# Patient Record
Sex: Male | Born: 1935 | Race: White | Hispanic: No | Marital: Married | Smoking: Former smoker
Health system: Southern US, Community
[De-identification: ages and names within clinical notes are randomized; demographics above are authoritative.]

## PROBLEM LIST (undated history)

## (undated) DIAGNOSIS — J45909 Unspecified asthma, uncomplicated: Secondary | ICD-10-CM

## (undated) DIAGNOSIS — J189 Pneumonia, unspecified organism: Secondary | ICD-10-CM

## (undated) DIAGNOSIS — E785 Hyperlipidemia, unspecified: Secondary | ICD-10-CM

## (undated) DIAGNOSIS — M1990 Unspecified osteoarthritis, unspecified site: Secondary | ICD-10-CM

## (undated) DIAGNOSIS — S065X9A Traumatic subdural hemorrhage with loss of consciousness of unspecified duration, initial encounter: Secondary | ICD-10-CM

## (undated) DIAGNOSIS — H409 Unspecified glaucoma: Secondary | ICD-10-CM

## (undated) DIAGNOSIS — N182 Chronic kidney disease, stage 2 (mild): Secondary | ICD-10-CM

## (undated) DIAGNOSIS — M810 Age-related osteoporosis without current pathological fracture: Secondary | ICD-10-CM

## (undated) DIAGNOSIS — K222 Esophageal obstruction: Secondary | ICD-10-CM

## (undated) DIAGNOSIS — K7689 Other specified diseases of liver: Secondary | ICD-10-CM

## (undated) DIAGNOSIS — C4490 Unspecified malignant neoplasm of skin, unspecified: Secondary | ICD-10-CM

## (undated) DIAGNOSIS — I5032 Chronic diastolic (congestive) heart failure: Secondary | ICD-10-CM

## (undated) DIAGNOSIS — J449 Chronic obstructive pulmonary disease, unspecified: Secondary | ICD-10-CM

## (undated) DIAGNOSIS — F329 Major depressive disorder, single episode, unspecified: Secondary | ICD-10-CM

## (undated) DIAGNOSIS — I2510 Atherosclerotic heart disease of native coronary artery without angina pectoris: Secondary | ICD-10-CM

## (undated) DIAGNOSIS — K219 Gastro-esophageal reflux disease without esophagitis: Secondary | ICD-10-CM

## (undated) DIAGNOSIS — S065XAA Traumatic subdural hemorrhage with loss of consciousness status unknown, initial encounter: Secondary | ICD-10-CM

## (undated) DIAGNOSIS — F1021 Alcohol dependence, in remission: Secondary | ICD-10-CM

## (undated) DIAGNOSIS — F32A Depression, unspecified: Secondary | ICD-10-CM

## (undated) DIAGNOSIS — G4730 Sleep apnea, unspecified: Secondary | ICD-10-CM

## (undated) DIAGNOSIS — I10 Essential (primary) hypertension: Secondary | ICD-10-CM

## (undated) DIAGNOSIS — I771 Stricture of artery: Secondary | ICD-10-CM

## (undated) DIAGNOSIS — K5790 Diverticulosis of intestine, part unspecified, without perforation or abscess without bleeding: Secondary | ICD-10-CM

## (undated) DIAGNOSIS — I480 Paroxysmal atrial fibrillation: Secondary | ICD-10-CM

## (undated) DIAGNOSIS — K449 Diaphragmatic hernia without obstruction or gangrene: Secondary | ICD-10-CM

## (undated) HISTORY — DX: Chronic diastolic (congestive) heart failure: I50.32

## (undated) HISTORY — DX: Chronic obstructive pulmonary disease, unspecified: J44.9

## (undated) HISTORY — DX: Esophageal obstruction: K22.2

## (undated) HISTORY — DX: Diaphragmatic hernia without obstruction or gangrene: K44.9

## (undated) HISTORY — DX: Other specified diseases of liver: K76.89

## (undated) HISTORY — DX: Hyperlipidemia, unspecified: E78.5

## (undated) HISTORY — DX: Unspecified asthma, uncomplicated: J45.909

## (undated) HISTORY — PX: TONSILLECTOMY: SUR1361

## (undated) HISTORY — DX: Unspecified osteoarthritis, unspecified site: M19.90

## (undated) HISTORY — DX: Age-related osteoporosis without current pathological fracture: M81.0

## (undated) HISTORY — DX: Diverticulosis of intestine, part unspecified, without perforation or abscess without bleeding: K57.90

## (undated) HISTORY — PX: HERNIA REPAIR: SHX51

## (undated) HISTORY — DX: Stricture of artery: I77.1

## (undated) HISTORY — DX: Major depressive disorder, single episode, unspecified: F32.9

## (undated) HISTORY — DX: Sleep apnea, unspecified: G47.30

## (undated) HISTORY — DX: Unspecified glaucoma: H40.9

## (undated) HISTORY — DX: Depression, unspecified: F32.A

## (undated) HISTORY — DX: Gastro-esophageal reflux disease without esophagitis: K21.9

## (undated) HISTORY — DX: Alcohol dependence, in remission: F10.21

---

## 2001-09-08 DIAGNOSIS — G4730 Sleep apnea, unspecified: Secondary | ICD-10-CM

## 2001-09-08 HISTORY — DX: Sleep apnea, unspecified: G47.30

## 2001-10-06 ENCOUNTER — Ambulatory Visit (HOSPITAL_BASED_OUTPATIENT_CLINIC_OR_DEPARTMENT_OTHER): Admission: RE | Admit: 2001-10-06 | Discharge: 2001-10-06 | Payer: Self-pay | Admitting: Internal Medicine

## 2001-10-06 ENCOUNTER — Encounter: Payer: Self-pay | Admitting: Internal Medicine

## 2004-05-03 ENCOUNTER — Ambulatory Visit: Payer: Self-pay | Admitting: Internal Medicine

## 2004-06-28 ENCOUNTER — Ambulatory Visit: Payer: Self-pay | Admitting: Internal Medicine

## 2004-08-23 ENCOUNTER — Ambulatory Visit: Payer: Self-pay | Admitting: Internal Medicine

## 2004-12-15 ENCOUNTER — Ambulatory Visit: Payer: Self-pay | Admitting: Internal Medicine

## 2005-04-10 HISTORY — PX: CATARACT EXTRACTION, BILATERAL: SHX1313

## 2005-05-03 ENCOUNTER — Ambulatory Visit: Payer: Self-pay | Admitting: Internal Medicine

## 2005-06-15 ENCOUNTER — Ambulatory Visit: Payer: Self-pay | Admitting: Internal Medicine

## 2005-08-17 ENCOUNTER — Ambulatory Visit: Payer: Self-pay | Admitting: Internal Medicine

## 2005-09-26 ENCOUNTER — Ambulatory Visit: Payer: Self-pay | Admitting: Internal Medicine

## 2005-09-29 ENCOUNTER — Ambulatory Visit: Payer: Self-pay | Admitting: Internal Medicine

## 2005-11-01 ENCOUNTER — Ambulatory Visit: Payer: Self-pay | Admitting: Internal Medicine

## 2005-12-13 ENCOUNTER — Ambulatory Visit: Payer: Self-pay | Admitting: Internal Medicine

## 2005-12-20 ENCOUNTER — Ambulatory Visit: Payer: Self-pay | Admitting: Internal Medicine

## 2006-04-25 ENCOUNTER — Ambulatory Visit: Payer: Self-pay | Admitting: Internal Medicine

## 2006-05-02 ENCOUNTER — Ambulatory Visit: Payer: Self-pay | Admitting: Internal Medicine

## 2006-06-05 ENCOUNTER — Ambulatory Visit: Payer: Self-pay | Admitting: Internal Medicine

## 2006-08-08 ENCOUNTER — Ambulatory Visit: Payer: Self-pay | Admitting: Internal Medicine

## 2006-11-09 ENCOUNTER — Ambulatory Visit: Payer: Self-pay | Admitting: Internal Medicine

## 2006-12-19 ENCOUNTER — Ambulatory Visit: Payer: Self-pay | Admitting: Internal Medicine

## 2006-12-31 ENCOUNTER — Ambulatory Visit: Payer: Self-pay | Admitting: Internal Medicine

## 2007-02-09 HISTORY — PX: CORONARY ARTERY BYPASS GRAFT: SHX141

## 2007-02-20 ENCOUNTER — Encounter: Payer: Self-pay | Admitting: Internal Medicine

## 2007-02-25 ENCOUNTER — Ambulatory Visit: Payer: Self-pay | Admitting: Cardiothoracic Surgery

## 2007-02-26 ENCOUNTER — Ambulatory Visit (HOSPITAL_COMMUNITY): Admission: RE | Admit: 2007-02-26 | Discharge: 2007-02-26 | Payer: Self-pay | Admitting: Cardiothoracic Surgery

## 2007-02-26 ENCOUNTER — Ambulatory Visit: Payer: Self-pay | Admitting: Vascular Surgery

## 2007-02-26 ENCOUNTER — Encounter: Payer: Self-pay | Admitting: Cardiothoracic Surgery

## 2007-02-28 ENCOUNTER — Inpatient Hospital Stay (HOSPITAL_COMMUNITY): Admission: RE | Admit: 2007-02-28 | Discharge: 2007-03-05 | Payer: Self-pay | Admitting: Cardiothoracic Surgery

## 2007-02-28 ENCOUNTER — Encounter: Payer: Self-pay | Admitting: Internal Medicine

## 2007-02-28 ENCOUNTER — Ambulatory Visit: Payer: Self-pay | Admitting: Internal Medicine

## 2007-03-01 ENCOUNTER — Ambulatory Visit: Payer: Self-pay | Admitting: Cardiothoracic Surgery

## 2007-03-04 ENCOUNTER — Encounter: Payer: Self-pay | Admitting: Cardiothoracic Surgery

## 2007-03-28 ENCOUNTER — Encounter (HOSPITAL_COMMUNITY): Admission: RE | Admit: 2007-03-28 | Discharge: 2007-04-10 | Payer: Self-pay | Admitting: Cardiology

## 2007-03-29 ENCOUNTER — Ambulatory Visit: Payer: Self-pay | Admitting: Cardiothoracic Surgery

## 2007-04-11 ENCOUNTER — Encounter (HOSPITAL_COMMUNITY): Admission: RE | Admit: 2007-04-11 | Discharge: 2007-07-10 | Payer: Self-pay | Admitting: Cardiology

## 2007-04-25 ENCOUNTER — Ambulatory Visit: Payer: Self-pay | Admitting: Internal Medicine

## 2007-05-10 DIAGNOSIS — I2510 Atherosclerotic heart disease of native coronary artery without angina pectoris: Secondary | ICD-10-CM | POA: Insufficient documentation

## 2007-05-10 DIAGNOSIS — G4733 Obstructive sleep apnea (adult) (pediatric): Secondary | ICD-10-CM

## 2007-05-10 DIAGNOSIS — I10 Essential (primary) hypertension: Secondary | ICD-10-CM

## 2007-05-10 DIAGNOSIS — J449 Chronic obstructive pulmonary disease, unspecified: Secondary | ICD-10-CM

## 2007-05-10 DIAGNOSIS — J302 Other seasonal allergic rhinitis: Secondary | ICD-10-CM

## 2007-05-10 DIAGNOSIS — J3089 Other allergic rhinitis: Secondary | ICD-10-CM

## 2007-05-13 ENCOUNTER — Ambulatory Visit: Payer: Self-pay | Admitting: Internal Medicine

## 2007-05-22 ENCOUNTER — Encounter: Payer: Self-pay | Admitting: Internal Medicine

## 2007-06-27 ENCOUNTER — Ambulatory Visit: Payer: Self-pay | Admitting: Internal Medicine

## 2007-08-29 ENCOUNTER — Ambulatory Visit: Payer: Self-pay | Admitting: Internal Medicine

## 2007-09-23 ENCOUNTER — Ambulatory Visit: Payer: Self-pay | Admitting: Internal Medicine

## 2007-09-23 ENCOUNTER — Encounter: Payer: Self-pay | Admitting: Internal Medicine

## 2007-10-01 ENCOUNTER — Telehealth: Payer: Self-pay | Admitting: Internal Medicine

## 2007-10-03 ENCOUNTER — Encounter: Payer: Self-pay | Admitting: Internal Medicine

## 2007-10-16 ENCOUNTER — Ambulatory Visit: Payer: Self-pay | Admitting: Internal Medicine

## 2007-10-18 ENCOUNTER — Telehealth (INDEPENDENT_AMBULATORY_CARE_PROVIDER_SITE_OTHER): Payer: Self-pay | Admitting: *Deleted

## 2007-10-18 ENCOUNTER — Telehealth: Payer: Self-pay | Admitting: Internal Medicine

## 2007-10-22 ENCOUNTER — Ambulatory Visit: Payer: Self-pay | Admitting: Internal Medicine

## 2007-10-22 LAB — CONVERTED CEMR LAB
ALT: 463 units/L — ABNORMAL HIGH (ref 0–53)
AST: 353 units/L — ABNORMAL HIGH (ref 0–37)
Anti Nuclear Antibody(ANA): NEGATIVE
BUN: 14 mg/dL (ref 6–23)
Calcium: 9 mg/dL (ref 8.4–10.5)
Eosinophils Absolute: 0.2 10*3/uL (ref 0.0–0.7)
Eosinophils Relative: 2.2 % (ref 0.0–5.0)
HCT: 38.5 % — ABNORMAL LOW (ref 39.0–52.0)
MCHC: 34.5 g/dL (ref 30.0–36.0)
Monocytes Relative: 5.6 % (ref 3.0–12.0)
Neutro Abs: 7.5 10*3/uL (ref 1.4–7.7)
Platelets: 356 10*3/uL (ref 150–400)
Pro B Natriuretic peptide (BNP): 75 pg/mL (ref 0.0–100.0)
RBC: 4.29 M/uL (ref 4.22–5.81)
RDW: 14.9 % — ABNORMAL HIGH (ref 11.5–14.6)
Total Protein: 6.6 g/dL (ref 6.0–8.3)
WBC: 8.8 10*3/uL (ref 4.5–10.5)

## 2007-10-23 ENCOUNTER — Ambulatory Visit: Payer: Self-pay | Admitting: Cardiology

## 2007-10-24 ENCOUNTER — Telehealth: Payer: Self-pay | Admitting: Internal Medicine

## 2007-10-25 ENCOUNTER — Ambulatory Visit: Payer: Self-pay | Admitting: Internal Medicine

## 2007-10-28 ENCOUNTER — Ambulatory Visit: Payer: Self-pay | Admitting: Pulmonary Disease

## 2007-10-28 ENCOUNTER — Telehealth: Payer: Self-pay | Admitting: Internal Medicine

## 2007-10-28 ENCOUNTER — Inpatient Hospital Stay (HOSPITAL_COMMUNITY): Admission: AD | Admit: 2007-10-28 | Discharge: 2007-11-01 | Payer: Self-pay | Admitting: Internal Medicine

## 2007-10-29 ENCOUNTER — Encounter: Payer: Self-pay | Admitting: Internal Medicine

## 2007-10-30 LAB — HM COLONOSCOPY

## 2007-10-31 ENCOUNTER — Encounter: Payer: Self-pay | Admitting: Internal Medicine

## 2007-11-04 ENCOUNTER — Ambulatory Visit: Payer: Self-pay | Admitting: Internal Medicine

## 2007-11-05 ENCOUNTER — Ambulatory Visit: Payer: Self-pay | Admitting: Internal Medicine

## 2007-11-05 LAB — CONVERTED CEMR LAB
ALT: 170 units/L — ABNORMAL HIGH (ref 0–53)
AST: 73 units/L — ABNORMAL HIGH (ref 0–37)
Albumin: 2.7 g/dL — ABNORMAL LOW (ref 3.5–5.2)
Alkaline Phosphatase: 91 units/L (ref 39–117)
Bilirubin, Direct: 0.2 mg/dL (ref 0.0–0.3)
Total Protein: 6.5 g/dL (ref 6.0–8.3)

## 2007-11-18 ENCOUNTER — Telehealth: Payer: Self-pay | Admitting: Internal Medicine

## 2007-12-03 ENCOUNTER — Ambulatory Visit: Payer: Self-pay | Admitting: Internal Medicine

## 2007-12-03 DIAGNOSIS — K222 Esophageal obstruction: Secondary | ICD-10-CM

## 2007-12-04 LAB — CONVERTED CEMR LAB
AST: 31 units/L (ref 0–37)
Albumin: 3.3 g/dL — ABNORMAL LOW (ref 3.5–5.2)
Alkaline Phosphatase: 61 units/L (ref 39–117)
Total Bilirubin: 0.9 mg/dL (ref 0.3–1.2)
Total Protein: 6.5 g/dL (ref 6.0–8.3)

## 2007-12-12 ENCOUNTER — Ambulatory Visit: Payer: Self-pay | Admitting: Internal Medicine

## 2007-12-15 ENCOUNTER — Encounter: Admission: RE | Admit: 2007-12-15 | Discharge: 2007-12-15 | Payer: Self-pay | Admitting: Neurology

## 2008-01-03 ENCOUNTER — Encounter: Payer: Self-pay | Admitting: Internal Medicine

## 2008-01-06 ENCOUNTER — Ambulatory Visit: Payer: Self-pay | Admitting: Internal Medicine

## 2008-03-30 ENCOUNTER — Ambulatory Visit: Payer: Self-pay | Admitting: Internal Medicine

## 2008-05-12 ENCOUNTER — Telehealth: Payer: Self-pay | Admitting: Internal Medicine

## 2008-05-12 ENCOUNTER — Ambulatory Visit: Payer: Self-pay | Admitting: Internal Medicine

## 2008-05-12 LAB — CONVERTED CEMR LAB: BUN: 18 mg/dL (ref 6–23)

## 2008-05-18 ENCOUNTER — Ambulatory Visit (HOSPITAL_COMMUNITY): Admission: RE | Admit: 2008-05-18 | Discharge: 2008-05-18 | Payer: Self-pay | Admitting: Internal Medicine

## 2008-07-21 ENCOUNTER — Encounter: Payer: Self-pay | Admitting: Internal Medicine

## 2008-07-22 ENCOUNTER — Ambulatory Visit: Payer: Self-pay | Admitting: Internal Medicine

## 2008-07-29 ENCOUNTER — Ambulatory Visit: Payer: Self-pay | Admitting: Internal Medicine

## 2008-07-29 DIAGNOSIS — K648 Other hemorrhoids: Secondary | ICD-10-CM | POA: Insufficient documentation

## 2008-09-23 ENCOUNTER — Encounter: Payer: Self-pay | Admitting: Internal Medicine

## 2008-10-25 ENCOUNTER — Encounter: Payer: Self-pay | Admitting: Internal Medicine

## 2008-11-09 ENCOUNTER — Ambulatory Visit: Payer: Self-pay | Admitting: Internal Medicine

## 2008-11-09 ENCOUNTER — Encounter: Payer: Self-pay | Admitting: Internal Medicine

## 2008-11-10 ENCOUNTER — Telehealth (INDEPENDENT_AMBULATORY_CARE_PROVIDER_SITE_OTHER): Payer: Self-pay | Admitting: *Deleted

## 2008-11-10 ENCOUNTER — Ambulatory Visit: Payer: Self-pay | Admitting: Internal Medicine

## 2008-11-10 LAB — CONVERTED CEMR LAB
BUN: 18 mg/dL (ref 6–23)
Creatinine, Ser: 0.9 mg/dL (ref 0.4–1.5)

## 2008-11-16 ENCOUNTER — Encounter: Payer: Self-pay | Admitting: Internal Medicine

## 2008-11-18 ENCOUNTER — Telehealth (INDEPENDENT_AMBULATORY_CARE_PROVIDER_SITE_OTHER): Payer: Self-pay | Admitting: *Deleted

## 2008-11-19 ENCOUNTER — Ambulatory Visit (HOSPITAL_COMMUNITY): Admission: RE | Admit: 2008-11-19 | Discharge: 2008-11-19 | Payer: Self-pay | Admitting: Internal Medicine

## 2008-11-19 ENCOUNTER — Ambulatory Visit: Payer: Self-pay | Admitting: Internal Medicine

## 2008-11-25 ENCOUNTER — Encounter: Payer: Self-pay | Admitting: Internal Medicine

## 2008-12-02 ENCOUNTER — Encounter: Payer: Self-pay | Admitting: Internal Medicine

## 2008-12-03 ENCOUNTER — Encounter: Payer: Self-pay | Admitting: Internal Medicine

## 2008-12-10 ENCOUNTER — Ambulatory Visit: Payer: Self-pay | Admitting: Internal Medicine

## 2008-12-11 ENCOUNTER — Ambulatory Visit: Payer: Self-pay | Admitting: Internal Medicine

## 2008-12-22 ENCOUNTER — Encounter: Payer: Self-pay | Admitting: Internal Medicine

## 2009-01-05 ENCOUNTER — Encounter: Payer: Self-pay | Admitting: Internal Medicine

## 2009-02-22 ENCOUNTER — Ambulatory Visit: Payer: Self-pay | Admitting: Internal Medicine

## 2009-03-11 ENCOUNTER — Ambulatory Visit: Payer: Self-pay | Admitting: Internal Medicine

## 2009-04-27 ENCOUNTER — Ambulatory Visit: Payer: Self-pay | Admitting: Internal Medicine

## 2009-06-23 ENCOUNTER — Telehealth: Payer: Self-pay | Admitting: Internal Medicine

## 2009-07-09 ENCOUNTER — Ambulatory Visit: Payer: Self-pay | Admitting: Internal Medicine

## 2009-07-12 ENCOUNTER — Encounter: Payer: Self-pay | Admitting: Internal Medicine

## 2009-07-14 ENCOUNTER — Telehealth (INDEPENDENT_AMBULATORY_CARE_PROVIDER_SITE_OTHER): Payer: Self-pay | Admitting: *Deleted

## 2009-07-15 ENCOUNTER — Encounter: Payer: Self-pay | Admitting: Internal Medicine

## 2009-07-19 ENCOUNTER — Ambulatory Visit: Payer: Self-pay | Admitting: Internal Medicine

## 2009-07-20 ENCOUNTER — Telehealth (INDEPENDENT_AMBULATORY_CARE_PROVIDER_SITE_OTHER): Payer: Self-pay | Admitting: *Deleted

## 2009-07-21 ENCOUNTER — Encounter: Payer: Self-pay | Admitting: Internal Medicine

## 2009-07-22 ENCOUNTER — Encounter: Payer: Self-pay | Admitting: Internal Medicine

## 2009-07-28 ENCOUNTER — Ambulatory Visit: Payer: Self-pay | Admitting: Internal Medicine

## 2009-08-03 ENCOUNTER — Ambulatory Visit: Payer: Self-pay | Admitting: Internal Medicine

## 2009-09-09 ENCOUNTER — Ambulatory Visit: Payer: Self-pay | Admitting: Internal Medicine

## 2009-09-29 ENCOUNTER — Telehealth (INDEPENDENT_AMBULATORY_CARE_PROVIDER_SITE_OTHER): Payer: Self-pay | Admitting: *Deleted

## 2009-09-30 ENCOUNTER — Ambulatory Visit: Payer: Self-pay | Admitting: Internal Medicine

## 2009-10-04 ENCOUNTER — Ambulatory Visit: Payer: Self-pay | Admitting: Cardiology

## 2009-10-04 DIAGNOSIS — E785 Hyperlipidemia, unspecified: Secondary | ICD-10-CM | POA: Insufficient documentation

## 2009-11-19 ENCOUNTER — Ambulatory Visit: Payer: Self-pay | Admitting: Internal Medicine

## 2009-11-19 DIAGNOSIS — K219 Gastro-esophageal reflux disease without esophagitis: Secondary | ICD-10-CM

## 2009-11-19 DIAGNOSIS — Z87891 Personal history of nicotine dependence: Secondary | ICD-10-CM

## 2009-11-19 DIAGNOSIS — Z87448 Personal history of other diseases of urinary system: Secondary | ICD-10-CM

## 2009-11-29 ENCOUNTER — Ambulatory Visit: Payer: Self-pay | Admitting: Cardiology

## 2009-11-29 ENCOUNTER — Ambulatory Visit: Payer: Self-pay

## 2009-12-03 LAB — CONVERTED CEMR LAB
Albumin: 3.9 g/dL (ref 3.5–5.2)
Alkaline Phosphatase: 57 units/L (ref 39–117)
HDL: 32.9 mg/dL — ABNORMAL LOW (ref >39.00)
Triglycerides: 77 mg/dL (ref 0.0–149.0)

## 2009-12-08 ENCOUNTER — Ambulatory Visit: Payer: Self-pay | Admitting: Internal Medicine

## 2009-12-08 ENCOUNTER — Encounter: Payer: Self-pay | Admitting: Internal Medicine

## 2009-12-30 ENCOUNTER — Ambulatory Visit: Payer: Self-pay | Admitting: Internal Medicine

## 2010-01-18 ENCOUNTER — Telehealth: Payer: Self-pay | Admitting: Cardiology

## 2010-01-20 ENCOUNTER — Telehealth: Payer: Self-pay | Admitting: Cardiology

## 2010-03-25 ENCOUNTER — Ambulatory Visit: Payer: Self-pay | Admitting: Internal Medicine

## 2010-03-25 DIAGNOSIS — G4710 Hypersomnia, unspecified: Secondary | ICD-10-CM

## 2010-03-29 ENCOUNTER — Telehealth: Payer: Self-pay | Admitting: Internal Medicine

## 2010-04-14 ENCOUNTER — Ambulatory Visit: Payer: Self-pay | Admitting: Internal Medicine

## 2010-05-02 ENCOUNTER — Encounter: Payer: Self-pay | Admitting: Internal Medicine

## 2010-05-08 LAB — CONVERTED CEMR LAB
ALT: 23 units/L
ALT: 27 units/L
AST: 24 units/L
AST: 25 units/L
Albumin: 4.3 g/dL
Albumin: 4.4 g/dL
Alkaline Phosphatase: 72 units/L
Chloride: 98 meq/L
Cholesterol: 195 mg/dL
Glucose, Bld: 110 mg/dL
HDL: 42 mg/dL
HDL: 44 mg/dL
LDL Cholesterol: 133 mg/dL
LDL Cholesterol: 60 mg/dL
LDL Cholesterol: 61 mg/dL
Potassium: 3.8 meq/L
Total Bilirubin: 0.5 mg/dL
Total Bilirubin: 0.8 mg/dL
Total Protein: 6.3 g/dL
Total Protein: 6.7 g/dL
Triglyceride fasting, serum: 78 mg/dL
Triglyceride fasting, serum: 88 mg/dL

## 2010-05-12 NOTE — Assessment & Plan Note (Signed)
Summary: per pt wife call falling asleep while driving/cb   Primary Provider/Referring Provider:  [facility]  CC:  Acute visit-pt is falling asleep while driving and eating.Marland Kitchen  History of Present Illness: September 30, 2009-  Asthma, allergic rhinitis, OSA, ................................Marland Kitchenwife here He was treated by NP over last 2 months and is feeling much better. She put him on otc prilosec and that also seems to have helped cough. He was much better until a week ago when he began needing to use his rescue inhaler again. More wheeze and exertional dypnea. Denies fever, sore throat, purulent. This week using both nebulizer and rescue inhaler. doesn't feel reflux. He started prednisone yesterday- taper. We discussed it and will extend it if needed.  December 30, 2009- Asthma, allergic rhinitis, OSA, GERD.......................Marland Kitchenwife here Fairly stable through the summer. In the last 2 days he has been wheezing and his routine use of nebulizer isn't clearing it. This may correspond to onset of rainy weather. Nose is also more congested, running and sneezing. He is frightened by potential for rapid progression of wheezing dyspnea. Wife assures me that he is compliant with his regimen.  March 25, 2010- Asthma, allergic rhinitis, OSA, GERD.......................Marland Kitchenwife here Nurse-CC: Acute visit-pt is falling asleep while driving and eating. Acute visit- Despite Provigil and compliant use of CPAP he is falling asleep driving, eating, conversation.  Usually Provigil had prevented this, using 1/2 x 200 mg. He sleeps every day during naps. Wife says he does snore some through his CPAP.      Asthma History    Asthma Control Assessment:    Age range: 12+ years    Symptoms: 0-2 days/week    Nighttime Awakenings: 0-2/month    Interferes w/ normal activity: no limitations    SABA use (not for EIB): 0-2 days/week    FEV1: 1.45 liters (today)    FEV1 Pred: 2.04 liters (today)  Asthma Control Assessment: Not Well Controlled   Preventive Screening-Counseling & Management  Alcohol-Tobacco     Alcohol drinks/day: 0     Alcohol Counseling: not indicated; patient does not drink     Smoking Status: quit     Year Quit: 1981     Cans of tobacco/week: no     Passive Smoke Exposure: yes     Tobacco Counseling: to remain off tobacco products  Current Medications (verified): 1)  Spiriva Handihaler 18 Mcg Caps (Tiotropium Bromide Monohydrate) .Marland Kitchen.. 1 Daily 2)  Budesonide 0.25 Mg/33ml Susp (Budesonide) .Marland Kitchen.. 1 Neb Two Times A Day 3)  Albuterol Sulfate (2.5 Mg/27ml) 0.083% Nebu (Albuterol Sulfate) .Marland Kitchen.. 1 Vial Via Hhn Two Times A Day 4)  Diovan Hct 160-12.5 Mg  Tabs (Valsartan-Hydrochlorothiazide) .Marland Kitchen.. 1 By Mouth Two Times A Day 5)  Klor-Con M20 20 Meq Cr-Tabs (Potassium Chloride Crys Cr) .... Take 1 Tablet By Mouth Once A Day 6)  Clonidine Hcl 0.1 Mg Tabs (Clonidine Hcl) .Marland Kitchen.. 1 Tab By Mouth  Every Morning and 2 Tabs At Bedtime 7)  Pravastatin Sodium 80 Mg Tabs (Pravastatin Sodium) .... One Daily in The Evening 8)  Adult Aspirin Low Strength 81 Mg  Tbdp (Aspirin) .... Take 1 By Mouth Once Daily 9)  Phillips Colon Health  Caps (Probiotic Product) .... Take 1 Capsule By Mouth Once A Day 10)  Zyrtec Allergy 10 Mg Tabs (Cetirizine Hcl) .... Take 1 Tab By Mouth At Bedtime 11)  Prilosec 20 Mg Cpdr (Omeprazole) .Marland Kitchen.. 1 Capsule Daily Before Meal 12)  Cpap 9 Cwp .... Wear At Bedtime 13)  Allergy Vaccine 1:10 Go (W-E) .... Every Friday 14)  Mucinex Dm 30-600 Mg Xr12h-Tab (Dextromethorphan-Guaifenesin) .... Take 1-2 Tablets Every 12 Hours As Needed 15)  Tessalon 200 Mg Caps (Benzonatate) .Marland Kitchen.. 1 Capsule By Mouth Every 8 Hours As Needed 16)  Albuterol Sulfate (2.5 Mg/71ml) 0.083% Nebu (Albuterol Sulfate) .Marland Kitchen.. 1 Neb in [place] Every 3 Hours As Needed 17)  Proair Hfa 108 (90 Base) Mcg/act  Aers (Albuterol Sulfate) .... 2 Puffs Every 3 Hours As Needed 18)  Nitrostat 0.4 Mg Subl  (Nitroglycerin) .Marland Kitchen.. 1 Under Tongue Every 5 Minutes X3 As Needed For Chest Pain 19)  Provigil 100 Mg Tabs (Modafinil) .Marland Kitchen.. 1 Daily If Needed 20)  Xalatan 0.005 % Soln (Latanoprost) .Marland Kitchen.. 1 Drop Each Eye Once Daily 21)  Amlodipine Besylate 5 Mg Tabs (Amlodipine Besylate) .... 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 Married , lives with wife 2 children Alcohol Use - no Daily Caffeine Use Illicit Drug Use - no   Risk Factors: Alcohol Use: 0 (03/25/2010) Caffeine Use: 3 (12/03/2007)  Risk Factors: Smoking Status: quit (03/25/2010) Cans of tobacco/wk: no (03/25/2010) Passive Smoke Exposure: yes (03/25/2010)  Review of Systems      See HPI  The patient denies shortness of breath with activity, shortness of breath at rest, productive cough, non-productive cough, coughing up blood,  chest pain, irregular heartbeats, acid heartburn, indigestion, loss of appetite, weight change, abdominal pain, difficulty swallowing, sore throat, tooth/dental problems, headaches, nasal congestion/difficulty breathing through nose, sneezing, itching, ear ache, rash, and change in color of mucus.    Vital Signs:  Patient profile:   77 year old male Height:      62 inches Weight:      158.50 pounds BMI:     29.09 O2 Sat:      97 % on Room air Pulse rate:   66 / minute BP sitting:   124 / 68  (left arm) Cuff size:   regular  Vitals Entered By: Reynaldo Minium CMA (March 25, 2010 1:55 PM)  O2 Flow:  Room air CC: Acute visit-pt is falling asleep while driving and eating.   Physical Exam  Additional Exam:  Additional Exam:  General: A/Ox3; pleasant and cooperative, NAD, hard of hearing,  SKIN: no rash, lesions, old tatoo NODES: no lymphadenopathy HEENT: [place]/AT, EOM- WNL, Conjuctivae- clear, PERRLA, TM-bilateral hearing aids, Nose- clear, Throat- clear and wnl, Mallampati  II NECK: Supple w/ fair  ROM, JVD- none, normal carotid impulses w/o bruits Thyroid-  CHEST: quiet w/o wheeze. HEART: RRR, no m/g/r heard ABDOMEN: Soft and nl; NWG:NFAO, nl pulses, no edema  NEURO: Grossly intact to observation     Pre-Spirometry FEV1    Value: 1.45 L     Pred: 2.04 L     Impression & Recommendations:  Problem # 1:  SLEEP APNEA (ICD-780.57) He is compliant with CPAP and better off with it. This relatively new concern about excessive daytime sleepiness is unclear. He might have a new primary sleep diagnosis- there are hints suggestive of cataplexy. We will emphasize adequate sleep, and gentle trial of additional stimulation.   Problem # 2:  ASTHMA (ICD-493.90) Asthma has been episodically severe, but is currently under satsifactory contol  Other Orders: Est. Patient Level IV (13086)  Patient Instructions: 1)  Keep scheduled appointment 2)  Try samples Nuvigil 150 as alternative to  Provigil 3)     1/2-1 tab daily if needed 4)  Be sure you are getting enough sleep.  5)  We will have the CPAP pressure increased to 10

## 2010-05-12 NOTE — Progress Notes (Signed)
Summary: cpap pressure -- order needed  Phone Note Call from Patient Call back at Home Phone [phone]   Caller: Patient Call For: young Reason for Call: Talk to Nurse Summary of Call: Patient's CPAP pressure was supposed to be increased to 10 per last ov.  Patient or [facility] has not heard anything from [place].  I did not see order in computer.    Initial call taken by: Lehman Prom,  March 29, 2010 9:28 AM  Follow-up for Phone Call        Per pt instructions from last OV note from 03/25/10, We will have the CPAP pressure increased to 10.  Dr. Maple Hudson, pls advise if anything else needs to be added to order.  Thanks! Gweneth Dimitri RN  March 29, 2010 11:06 AM    Additional Follow-up for Phone Call Additional follow up Details #1::        No- I've ordered th CPAP pressure change thanks.  Additional Follow-up by: Waymon Budge MD,  March 29, 2010 1:22 PM    Additional Follow-up for Phone Call Additional follow up Details #2::    Order faxed to [facility] to increase cpap pressure to 10 cwp. Pt advised that [name] should be getting in touch with him this week. Pt also advised to call us by Friday early afternoon if he hasn't heard from [facility], so we can address this issue before the holidays. Rhonda Cobb  March 29, 2010 2:41 PM   New/Updated Medications: * CPAP 10 CWP wear at bedtime

## 2010-05-12 NOTE — Progress Notes (Signed)
Summary: [facility] dulicate rx  Phone Note Call from Patient Message from:  Patient on January 20, 2010 10:02 AM  Caller: Patient Reason for Call: Talk to Nurse Summary of Call: pt's prescription for diovan was a duplicate-pls call [facility] [phone] ref # [phone] Initial call taken by: Glynda Jaeger,  January 20, 2010 10:06 AM  Follow-up for Phone Call        REF number not active. [facility] pharmacist states disreguard message. Follow-up by: Marrion Coy, CNA,  January 20, 2010 10:23 AM

## 2010-05-12 NOTE — Progress Notes (Signed)
Summary: wheezing  Phone Note Call from Patient   Caller: Spouse Call For: young Summary of Call: pt would like prednisone for wheezing  [facility] [address] Initial call taken by: Rickard Patience,  September 29, 2009 3:45 PM  Follow-up for Phone Call        called spoke with pt's wife.  pt c/o wheezing, dry cough, increased SOB w/ activity x2days - denies any f/c/s.  states he just finished a breathing tx w/ albuterol and is still wheezing.  pt is requesting prednisone.  please advise thanks!  ALLERGIES: codeine [name] CNA/MA  September 29, 2009 4:09 PM   Additional Follow-up for Phone Call Additional follow up Details #1::        unknown to me whether "wheezing " is upper airway or lower.   can call in prednisone, but only if he is seen THIS WEEK.  Has seen TP a lot, and probaby needs visit with Dr Maple Hudson. prednisone 10mg   take 4 each day for 2 days, then 3 each day for 2 days, then 2 each day for 2 days, then 1 each day for 2 days, then stop #20, no fills. Additional Follow-up by: Barbaraann Share MD,  September 29, 2009 5:19 PM    Additional Follow-up for Phone Call Additional follow up Details #2::    called spoke with patient's wife.  informed her of KC's recs of pred taper and ov w/ CDY.  appt made with CDY tomorrow afternoon @ 1400.  pt and wife okay with this date and time.  rx sent to pt's pharmacy of choice. [name] CNA/MA  September 29, 2009 5:27 PM   New/Updated Medications: PREDNISONE 10 MG TABS (PREDNISONE) take 4 each day for 2 days, then 3 each day for 2 days, then 2 each day for 2 days, then 1 each day for 2 days, then stop Prescriptions: PREDNISONE 10 MG TABS (PREDNISONE) take 4 each day for 2 days, then 3 each day for 2 days, then 2 each day for 2 days, then 1 each day for 2 days, then stop  #20 x 0   Entered by:   [name] CNA/MA   Authorized by:   Barbaraann Share MD   Signed by:   [name] CNA/MA on 09/29/2009   Method used:   Electronically to   [facility]  [address] # [id number]* (retail)       [address]       [place], [place]  [address]       Ph: [phone] or [phone]       Fax: [phone]   RxID:   [id number]

## 2010-05-12 NOTE — Cardiovascular Report (Signed)
Summary: [facility]  [facility]   Imported By: Lester Roxboro 12/22/2009 09:11:28  _____________________________________________________________________  External Attachment:    Type:   Image     Comment:   External Document

## 2010-05-12 NOTE — Assessment & Plan Note (Signed)
Summary: 4 months/apc   Primary Provider/Referring Provider:  Charolette Child, MD  CC:  4 month follow up visit-wheezing and SOB still; not getting any better.Marland Kitchen  History of Present Illness:  12/11/08- asthma, allergic rhinitis, cough, OSA...................................Marland Kitchenwife here He is feeling much better and they think probably a combination of Spirva and reassurance made the difference. Almost no cough or wheeze now. They are about to head to beach and we discussed that. He is back to walking and gradually increasing his exercise class involvement.  February 22, 2009- Asthma, allergic rhinitis, cough, OSA.......................Marland Kitchenwife here Blames chocolate for increased wheeze for 3 weeks. Home neb is not helping. For past 2 days scant sputum has been green. Primary office gave Spiriva and prednisone which havn't helped.Amiodarone is reduced. We reviewed meds. Told he was too old for transplant and that has discouraged him.  March 11, 2009- Asthma, allergic rhinitis, cough, OSA..............................Marland Kitchenwife here He is feeling pretty well today. Wheezed some this morning and last night. He regards this as his baseline. IgE of 19 was not in range to consider Xolair as discussed.  July 09, 2009- Asthma, allergic rhinitis, cough, OSA.........................Marland Kitchenwife here Struggling with increased asthma, dyspnea, chest tightness since February. Much cough interfering with sleep. Urgent Care gave depo w/out help. Denies productive cough, fever, chest pain. Steady wheeze. We had called in prednisone which helped some. Finished last prednisone over a week ago. He dropped off Spiriva but remembers it helping They ask refill on provigil to help with safe driving    Current Medications (verified): 1)  Cpap .... @ 9 Cwp 2)  Allergy Vaccine 1:10 Go (W-E) .... . 3)  Advair Diskus 100-50 Mcg/dose  Misc (Fluticasone-Salmeterol) .... Inhale 1 Puff Two Times A Day 4)  Proair Hfa 108 (90 Base)  Mcg/act  Aers (Albuterol Sulfate) .... 2 Puffs Four Times A Day As Needed 5)  Adult Aspirin Low Strength 81 Mg  Tbdp (Aspirin) .... Take 1 By Mouth Once Daily 6)  Clonidine Hcl 0.3 Mg  Tabs (Clonidine Hcl) .... Take 1 Tablet in The Am and 2 Tablets in The Pm 7)  Nitroquick 0.4 Mg  Subl (Nitroglycerin) .... Use As Directed As Needed 8)  Amiodarone Hcl 200 Mg Tabs (Amiodarone Hcl) .... Take 1 Tablet Every Other Day 9)  Diovan Hct 160-12.5 Mg  Tabs (Valsartan-Hydrochlorothiazide) .... Take 1 Tablet By Mouth Two Times A Day 10)  Klor-Con 20 Meq Pack (Potassium Chloride) .... Take 1 Tablet By Mouth Once A Day 11)  Provigil 100 Mg Tabs (Modafinil) .Marland Kitchen.. 1 Daily If Needed 12)  Dexilant 60 Mg Cpdr (Dexlansoprazole) .... Take One By Mouth Every Morning 13)  Benzonatate 200 Mg Caps (Benzonatate) .... Take One Capsule By Mouth Every 4 Hours As Needed For Cough 14)  Accuneb 0.63 Mg/73ml Nebu (Albuterol Sulfate) .... As Needed 15)  Spiriva Handihaler 18 Mcg Caps (Tiotropium Bromide Monohydrate) .Marland Kitchen.. 1 Daily  Allergies (verified): 1)  Codeine  Past History:  Past Medical History: Last updated: 12/03/2007 Allergic Rhinitis Asthma Coronary Heart Disease- no MI Hypertension Sleep Apnea- NPSG  AHI 22/hr 10/06/01 Alcoholism Depression Hyperlipidemia Pneumonia  Past Surgical History: Last updated: 07/29/2008 CABG 11/08-5v Hernia Surgery (12/03/2007)  Family History: Last updated: October 17, 2007 Mother- deceased age 39; CHF Father- deceased age 76; heart attack Sister- living age 35; COPD; Rheumatism, asthma, emphysema Brother- living age 47; heart transplant  Social History: Last updated: 12/03/2007 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  The patient denies anorexia, fever, weight loss, weight gain, vision loss, decreased hearing, hoarseness, chest pain, syncope, peripheral edema, headaches, hemoptysis, and severe indigestion/heartburn.    Vital Signs:  Patient profile:   77 year old male Height:      62 inches Weight:      168.38 pounds BMI:     30.91 O2 Sat:      98 % on Room air Pulse rate:   75 / minute BP sitting:   138 / 80  (left arm) Cuff size:   regular  Vitals Entered By: Reynaldo Minium CMA (July 09, 2009 2:12 PM)  O2 Flow:  Room air  Physical Exam  Additional Exam:  General: A/Ox3; pleasant and cooperative, NAD, hard of hearing, obese SKIN: no rash, lesions NODES: no lymphadenopathy HEENT: [place]/AT, EOM- WNL, Conjuctivae- clear, PERRLA, TM-bilateral hearing aids, Nose- clear, Throat- clear and wnl NECK: Supple w/ fair ROM, JVD- none, normal carotid impulses w/o bruits Thyroid-  CHEST: I&E wheeze diffusely. HEART: RRR, no m/g/r heard ABDOMEN: Soft and nl; ZOX:WRUE, nl pulses, no edema  NEURO: Grossly intact to observation      Impression & Recommendations:  Problem # 1:  ASTHMA (ICD-493.90) Sustained exacerbation. We will switch to Xopenex for neb since he reports it works better for him. We will add back Spiriva and start budesonide neb.  Problem # 2:  SLEEP APNEA (ICD-780.57)  Asks refill provigil for limited use. Otherwise doing well.  Medications Added to Medication List This Visit: 1)  Xopenex 1.25 Mg/48ml Nebu (Levalbuterol hcl) .Marland Kitchen.. 1 three times a day as needed neb 2)  Budesonide 0.25 Mg/76ml Susp (Budesonide) .Marland Kitchen.. 1 neb two times a day  Other Orders: Est. Patient Level III (45409) Admin of Therapeutic Inj  intramuscular or subcutaneous (81191) Depo- Medrol 80mg  (J1040) Nebulizer Tx (47829)  Patient Instructions: 1)  Please schedule a follow-up appointment in 4 months. 2)  neb xop  1.25 3)  depo 80 4)  scripts fro Spiriva, Xopenex neb solution, provigil Prescriptions: PROVIGIL 100 MG TABS (MODAFINIL) 1 daily if needed  #10 x prn   Entered and Authorized by:   Waymon Budge MD   Signed by:   Waymon Budge MD on 07/09/2009   Method used:   Print then Give to Patient   RxID:   [id number] XOPENEX 1.25 MG/3ML NEBU (LEVALBUTEROL HCL) 1 three times a day as needed neb  #100 x prn   Entered and Authorized by:   Waymon Budge MD   Signed by:   Waymon Budge MD on 07/09/2009   Method used:   Print then Give to Patient   RxID:   [id number] SPIRIVA HANDIHALER 18 MCG CAPS (TIOTROPIUM BROMIDE MONOHYDRATE) 1 daily  #30 x prn   Entered and Authorized by:   Waymon Budge MD   Signed by:   Waymon Budge MD on 07/09/2009   Method used:   Historical   RxID:   [id number] BUDESONIDE 0.25 MG/2ML SUSP (BUDESONIDE) 1 neb two times a day  #60 x prn   Entered and Authorized by:   Waymon Budge MD   Signed by:  Waymon Budge MD on 07/09/2009   Method used:   Historical   RxID:   [id number]      Medication Administration  Injection # 1:    Medication: Depo- Medrol 80mg     Diagnosis: ASTHMA (ICD-493.90)    Route: IM    Site: LUOQ gluteus    Exp Date: 02/2012    Lot #: [id number]    Mfr: Pharmacia    Patient tolerated injection without complications    Given by: Reynaldo Minium CMA (July 09, 2009 3:29 PM)  Medication # 1:    Medication: Xopenex 1.25mg     Diagnosis: ASTHMA (ICD-493.90)    Dose: 1 vial     Route: inhaled    Exp Date: 12/2009    Lot #: [id number]    Mfr: Sepracor    Patient tolerated medication without complications    Given by: Reynaldo Minium CMA (July 09, 2009 3:29 PM)  Orders Added: 1)  Est. Patient Level III [62130] 2)  Admin of Therapeutic Inj  intramuscular or subcutaneous [96372] 3)  Depo- Medrol 80mg  [J1040] 4)  Nebulizer Tx [86578]

## 2010-05-12 NOTE — Progress Notes (Signed)
Summary: supplements-spouse returned call  Phone Note Call from Patient Call back at Home Phone [phone]   Caller: Spouse Call For: tammy parrett Summary of Call: pt's spouse [name] requests to speak to jj re: supplements for pt.  Initial call taken by: Tivis Ringer, CNA,  July 20, 2009 11:05 AM  Follow-up for Phone Call        [name] are you familiar with this if not I will call. Please advise. Thanks. [name] CMA  July 20, 2009 11:30 AM   no, i am not familiar with this question.  but i will be calling pt about cxr results, so will handle this msg when i do. [name] CNA  July 20, 2009 12:03 PM   spoke with patient, he states that his wife is not home and he is unsure of her questions.  pt will have his wife call back when she comes home. [name] CNA  July 20, 2009 3:01 PM   Additional Follow-up for Phone Call Additional follow up Details #1::        pt's wife returned call from [name]. spouse ruth fero is at home # now. Tivis Ringer, CNA  July 20, 2009 4:50 PM   wife called and states that pt is on Omega-3 fish oil and phillips colon care, and states she thought TP mentioned him stopping these. I didnt see any mention in note, but these are not on pt med list. Please advise. Carron Curie CMA  July 20, 2009 4:58 PM      Additional Follow-up for Phone Call Additional follow up Details #2::    called spoke with pt's wife, [name].  advised her to hold pt's omega-3 fish oil until next OV and that he may resume the probiotic.  pt's wife verbalized her understanding. [name] CNA  July 20, 2009 5:21 PM   New/Updated Medications: FISH OIL 1000 MG CAPS (OMEGA-3 FATTY ACIDS) Take 1 capsule by mouth once a day PHILLIPS COLON HEALTH  CAPS (PROBIOTIC PRODUCT) Take 1 capsule by mouth once a day

## 2010-05-12 NOTE — Medication Information (Signed)
Summary: Pt assist DENIED/[employer] Assistance Program  Pt assist DENIED/[employer] Assistance Program   Imported By: Sherian Rein 07/21/2009 08:48:37  _____________________________________________________________________  External Attachment:    Type:   Image     Comment:   External Document

## 2010-05-12 NOTE — Assessment & Plan Note (Signed)
Summary: NP follow up - med calendar   Primary Provider/Referring Provider:  Charolette Child, MD  CC:  new med calendar - pt brought all meds with him today.  pt would like to discuss the price of his neb meds.  History of Present Illness:  March 11, 2009- Asthma, allergic rhinitis, cough, OSA..............................Marland Kitchenwife here He is feeling pretty well today. Wheezed some this morning and last night. He regards this as his baseline. IgE of 19 was not in range to consider Xolair as discussed.  July 09, 2009- Asthma, allergic rhinitis, cough, OSA.........................Marland Kitchenwife here Struggling with increased asthma, dyspnea, chest tightness since February. Much cough interfering with sleep. Urgent Care gave depo w/out help. Denies productive cough, fever, chest pain. Steady wheeze. We had called in prednisone which helped some. Finished last prednisone over a week ago. He dropped off Spiriva but remembers it helping They ask refill on provigil to help with safe driving  July 19, 2009 --Presents for an acute office visit. Complains of asthma flare with wheezing, dyspnea, cough occ producing clear mucus x 2 months. He is here with wife today. They are very confused with meds. He has stopped advair, only using spiriva as needed-not on regular basis. He is not taking any PPI which MAR indicates he is suppose to be on Dexilant. He c/o of persistent wheezing, cough for last 2 months. Has not been able to exercise as he usually does for several weeks. He is very upset that he is not getting better, has been seen at [facility] as well. Received deop medrol shot last visit. with some helps. Xopenex nebs his wheezing.    July 28, 2009--Presents for follow up and med review. Last visit tx w/ rec to take spiriva daily, added budesonide/xopenx neb and tx possible reflux and rhinitis. along w/ steroid taper. He feels  so much better, back to his normal. Wife requested nebs be sent to [facility] , and they are  too expensive we will send to [facility] (he has CPAP thru them), but will have to change xopenex to albuterol d/t coverage. we reviewed all his meds and organized them in a med calendar. He has been recommended to stop fish oil for now d/t potential of upper airway irritation.     Medications Prior to Update: 1)  Spiriva Handihaler 18 Mcg Caps (Tiotropium Bromide Monohydrate) .Marland Kitchen.. 1 Daily 2)  Budesonide 0.25 Mg/76ml Susp (Budesonide) .Marland Kitchen.. 1 Neb Two Times A Day 3)  Diovan Hct 160-12.5 Mg  Tabs (Valsartan-Hydrochlorothiazide) .... Take 1 Tablet By Mouth Two Times A Day 4)  Xopenex 1.25 Mg/52ml Nebu (Levalbuterol Hcl) .Marland Kitchen.. 1 Neb Four Times A Day  and Every 3 Hrs As Needed. 5)  Clonidine Hcl 0.3 Mg  Tabs (Clonidine Hcl) .... Take 1 Tablet in The Am and 2 Tablets in The Pm 6)  Amiodarone Hcl 200 Mg Tabs (Amiodarone Hcl) .... Take 1 Tablet Every Other Day 7)  Adult Aspirin Low Strength 81 Mg  Tbdp (Aspirin) .... Take 1 By Mouth Once Daily 8)  Klor-Con 20 Meq Pack (Potassium Chloride) .... Take 1 Tablet By Mouth Once A Day 9)  Phillips Colon Health  Caps (Probiotic Product) .... Take 1 Capsule By Mouth Once A Day 10)  Dexilant 60 Mg Cpdr (Dexlansoprazole) .... Take One By Mouth Every Morning 11)  Cpap .... @ 9 Cwp 12)  Allergy Vaccine 1:10 Go (W-E) .... . 13)  Tessalon 200 Mg Caps (Benzonatate) .Marland Kitchen.. 1 By Mouth Three Times A Day As  Needed 14)  Proair Hfa 108 (90 Base) Mcg/act  Aers (Albuterol Sulfate) .... 2 Puffs Four Times A Day As Needed 15)  Nitroquick 0.4 Mg  Subl (Nitroglycerin) .... Use As Directed As Needed 16)  Provigil 100 Mg Tabs (Modafinil) .Marland Kitchen.. 1 Daily If Needed 17)  Prednisone 10 Mg Tabs (Prednisone) .... 4 Tabs For 2 Days, Then 3 Tabs For 2 Days, 2 Tabs For 2 Days, Then 1 Tab For 2 Days, Then Stop 18)  Fish Oil 1000 Mg Caps (Omega-3 Fatty Acids) .... Take 1 Capsule By Mouth Once A Day  Current Medications (verified): 1)  Spiriva Handihaler 18 Mcg Caps (Tiotropium Bromide Monohydrate)  .Marland Kitchen.. 1 Daily 2)  Budesonide 0.25 Mg/43ml Susp (Budesonide) .Marland Kitchen.. 1 Neb Two Times A Day 3)  Albuterol Sulfate (2.5 Mg/3ml) 0.083% Nebu (Albuterol Sulfate) .Marland Kitchen.. 1 Vial Via Hhn Two Times A Day 4)  Diovan Hct 160-12.5 Mg  Tabs (Valsartan-Hydrochlorothiazide) .... Take 1 Tablet By Mouth Once A Day 5)  Amlodipine Besylate 2.5 Mg Tabs (Amlodipine Besylate) .... Take 1 Tab By Mouth At Bedtime 6)  Klor-Con M20 20 Meq Cr-Tabs (Potassium Chloride Crys Cr) .... Take 1 Tablet By Mouth Once A Day 7)  Clonidine Hcl 0.1 Mg Tabs (Clonidine Hcl) .Marland Kitchen.. 1 Tab By Mouth  Every Morning and 2 Tabs At Bedtime 8)  Amiodarone Hcl 200 Mg Tabs (Amiodarone Hcl) .... Take 1 Tablet Every Other Day 9)  Simvastatin 40 Mg Tabs (Simvastatin) .... Take 1 Tab By Mouth At Bedtime 10)  Adult Aspirin Low Strength 81 Mg  Tbdp (Aspirin) .... Take 1 By Mouth Once Daily 11)  Phillips Colon Health  Caps (Probiotic Product) .... Take 1 Capsule By Mouth Once A Day 12)  Multivitamins   Tabs (Multiple Vitamin) .... Take 1 Tablet By Mouth Once A Day 13)  Zyrtec Allergy 10 Mg Tabs (Cetirizine Hcl) .... Take 1 Tab By Mouth At Bedtime 14)  Prilosec 20 Mg Cpdr (Omeprazole) .Marland Kitchen.. 1 Capsule Daily Before Meal 15)  Cpap 9 Cwp .... Wear At Bedtime 16)  Allergy Vaccine 1:10 Go (W-E) .... Every Friday 17)  Mucinex Dm 30-600 Mg Xr12h-Tab (Dextromethorphan-Guaifenesin) .... Take 1-2 Tablets Every 12 Hours As Needed 18)  Tessalon 200 Mg Caps (Benzonatate) .Marland Kitchen.. 1 Capsule By Mouth Every 8 Hours As Needed 19)  Albuterol Sulfate (2.5 Mg/23ml) 0.083% Nebu (Albuterol Sulfate) .Marland Kitchen.. 1 Neb in [place] Every 3 Hours As Needed 20)  Proair Hfa 108 (90 Base) Mcg/act  Aers (Albuterol Sulfate) .... 2 Puffs Every 3 Hours As Needed 21)  Nitrostat 0.4 Mg Subl (Nitroglycerin) .Marland Kitchen.. 1 Under Tongue Every 5 Minutes X3 As Needed For Chest Pain 22)  Provigil 100 Mg Tabs (Modafinil) .Marland Kitchen.. 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 Alcohol Use - no Daily Caffeine Use Illicit Drug Use - no  Risk Factors: Caffeine Use: 3 (12/03/2007)  Risk Factors: Smoking Status: quit (12/03/2007) Cans of tobacco/wk: no (12/03/2007)  Past Medical History: Allergic Rhinitis Asthma --July 28, 2009-Spirometry shows FEV1 1.45 (70%) Coronary Heart Disease- no MI Hypertension Sleep Apnea- NPSG  AHI 22/hr 10/06/01 Alcoholism  Depression Hyperlipidemia Pneumonia  Review of Systems      See HPI  Vital Signs:  Patient profile:   77 year old male Height:      62 inches Weight:      166 pounds BMI:     30.47 O2 Sat:      97 % on Room air Temp:     97.3 degrees F oral Pulse rate:   78 / minute BP sitting:   160 / 78  (left arm) Cuff size:   regular  Vitals Entered By: Stephen Master CNA (July 28, 2009 9:00 AM)  O2 Flow:  Room air CC: new med calendar - pt brought all meds with him today.  pt would like to discuss the price of his neb meds Is Patient Diabetic? No Comments Medications reviewed with patient Daytime contact number verified with patient. Stephen Master CNA  July 28, 2009 9:02 AM    Physical Exam  Additional Exam:  General: A/Ox3; pleasant and cooperative, NAD, hard of hearing, obese SKIN: no rash, lesions NODES: no lymphadenopathy HEENT: [place]/AT, EOM- WNL, Conjuctivae- clear, PERRLA, TM-bilateral hearing aids, Nose- clear, Throat- clear and wnl NECK: Supple w/ fair ROM, JVD- none, normal carotid impulses w/o bruits Thyroid- nonpalplable  CHEST: CTA no wheezing. upper airway is clear  of psuedowheezing.  HEART: RRR, no m/g/r heard ABDOMEN: Soft and nl; ZOX:WRUE, nl pulses, no edema  NEURO: Grossly intact to observation      Pulmonary Function Test Date: 07/28/2009 09:56 AM Gender: Male  Pre-Spirometry FVC    Value: 2.13 L/min   % Pred: 73.40 % FEV1    Value: 1.45 L     Pred: 2.04 L     % Pred: 70.80 % FEV1/FVC  Value: 68.07 %     % Pred: 94.10 %  Impression & Recommendations:  Problem # 1:  ASTHMA (ICD-493.90)  Spirometry shows FEV1 1.45 (70%). May have component of COPD -prev. heavy smoker.  Recent flare now improved. He may do better on nebs d/t less upper airway irritation.  would stay off fish oil for while longer. Meds reviewed with pt education and computerized med calendar completed/adjusted.  he is concerned that spiriva is too expensive will cont for 6 weeks if stil doing well will do a stop trial.     Orders: DME Referral (DME) Spirometry w/Graph (94010) Est. Patient Level III (45409)  Medications Added to Medication List This Visit: 1)  Albuterol Sulfate (2.5 Mg/38ml) 0.083% Nebu (Albuterol sulfate) .Marland Kitchen.. 1 vial via hhn two times a day and as needed every 3 hr 2)  Albuterol Sulfate (2.5 Mg/30ml) 0.083% Nebu (Albuterol sulfate) .Marland Kitchen.. 1 vial via hhn two times a day 3)  Diovan Hct 160-12.5 Mg Tabs (Valsartan-hydrochlorothiazide) .... Take 1 tablet by mouth once a day 4)  Amlodipine Besylate 2.5 Mg Tabs (Amlodipine besylate) .... Take 1 tab by mouth at bedtime 5)  Klor-con M20 20 Meq Cr-tabs (Potassium chloride crys cr) .... Take 1 tablet by mouth once a day 6)  Clonidine Hcl 0.1 Mg Tabs (Clonidine hcl) .Marland Kitchen.. 1 tab by mouth  every morning and 2 tabs at bedtime 7)  Simvastatin 40 Mg Tabs (Simvastatin) .... Take 1 tab by mouth at bedtime 8)  Multivitamins Tabs (Multiple vitamin) .... Take 1 tablet by mouth once a day 9)  Zyrtec Allergy 10 Mg Tabs (Cetirizine hcl) .... Take 1 tab by mouth at bedtime 10)  Prilosec 20 Mg Cpdr (Omeprazole) .Marland Kitchen.. 1 capsule  daily before meal 11)  Cpap 9 Cwp  .... Wear at bedtime 12)  Allergy Vaccine 1:10 Go (w-e)  .... Every friday 13)  Mucinex Dm 30-600 Mg Xr12h-tab (Dextromethorphan-guaifenesin) .... Take 1-2 tablets every 12 hours as needed 14)  Tessalon 200 Mg Caps (Benzonatate) .Marland Kitchen.. 1 capsule by mouth every 8 hours as needed 15)  Klor-con M20 20 Meq Cr-tabs (Potassium chloride crys cr) .... . 16)  Albuterol Sulfate (2.5 Mg/61ml) 0.083% Nebu (Albuterol sulfate) .Marland Kitchen.. 1 neb in vial every 3 hours as needed 17)  Proair Hfa 108 (90 Base) Mcg/act Aers (Albuterol sulfate) .... 2 puffs every 3 hours as needed 18)  Nitrostat 0.4 Mg Subl (Nitroglycerin) .Marland Kitchen.. 1 under tongue every 5 minutes x3 as needed for chest pain  Complete Medication List: 1)  Spiriva Handihaler 18 Mcg Caps (Tiotropium bromide monohydrate) .Marland Kitchen.. 1 daily 2)  Budesonide 0.25 Mg/59ml Susp (Budesonide) .Marland Kitchen.. 1 neb two times a day 3)  Albuterol Sulfate (2.5 Mg/58ml) 0.083% Nebu (Albuterol sulfate) .Marland Kitchen.. 1 vial via hhn two times a day 4)  Diovan Hct 160-12.5 Mg Tabs (Valsartan-hydrochlorothiazide) .... Take 1 tablet by mouth once a day 5)  Amlodipine Besylate 2.5 Mg Tabs (Amlodipine besylate) .... Take 1 tab by mouth at bedtime 6)  Klor-con M20 20 Meq Cr-tabs (Potassium chloride crys cr) .... Take 1 tablet by mouth once a day 7)  Clonidine Hcl 0.1 Mg Tabs (Clonidine hcl) .Marland Kitchen.. 1 tab by mouth  every morning and 2 tabs at bedtime 8)  Amiodarone Hcl 200 Mg Tabs (Amiodarone hcl) .... Take 1 tablet every other day 9)  Simvastatin 40 Mg Tabs (Simvastatin) .... Take 1 tab by mouth at bedtime 10)  Adult Aspirin Low Strength 81 Mg Tbdp (Aspirin) .... Take 1 by mouth once daily 11)  Phillips Colon Health Caps (Probiotic product) .... Take 1 capsule by mouth once a day 12)  Multivitamins Tabs (Multiple vitamin) .... Take 1 tablet by mouth once a day 13)  Zyrtec Allergy 10 Mg Tabs (Cetirizine hcl) .... Take 1 tab by mouth at bedtime 14)  Prilosec 20 Mg Cpdr  (Omeprazole) .Marland Kitchen.. 1 capsule daily before meal 15)  Cpap 9 Cwp  .... Wear at bedtime 16)  Allergy Vaccine 1:10 Go (w-e)  .... Every friday 17)  Mucinex Dm 30-600 Mg Xr12h-tab (Dextromethorphan-guaifenesin) .... Take 1-2 tablets every 12 hours as needed 18)  Tessalon 200 Mg Caps (Benzonatate) .Marland Kitchen.. 1 capsule by mouth every 8 hours as needed 19)  Albuterol Sulfate (2.5 Mg/65ml) 0.083% Nebu (Albuterol sulfate) .Marland Kitchen.. 1 neb in vial every 3 hours as needed 20)  Proair Hfa 108 (90 Base) Mcg/act Aers (Albuterol sulfate) .... 2 puffs every 3 hours as needed 21)  Nitrostat 0.4 Mg Subl (Nitroglycerin) .Marland Kitchen.. 1 under tongue every 5 minutes x3 as needed for chest pain 22)  Provigil 100 Mg Tabs (Modafinil) .Marland Kitchen.. 1 daily if needed  Patient Instructions: 1)  Follow med calendar closely and bring to each visit.  2)  follow up 6 weeks  3)  If you are doing well we will consider stopping spiriva at next office visit.  4)  I am going to send neb meds through [facility] .  5)  Please contact office for sooner follow up if symptoms do not improve or worsen  6)  Hold fish oil 7)  We are changing xopenex to albuterol because it is not covered with home care companies.  Prescriptions: ALBUTEROL SULFATE (2.5 MG/3ML) 0.083% NEBU (ALBUTEROL SULFATE) 1 vial via [facility] two times a day and  as needed every 3 hr  #100 x 11   Entered and Authorized by:   Rubye Oaks NP   Signed by:   Rushi Chasen NP on 07/28/2009   Method used:   Print then Give to Patient   RxID:   [id number] XOPENEX 1.25 MG/3ML NEBU (LEVALBUTEROL HCL) 1 neb four times a day  and every 3 hrs as needed.  #100 x 11   Entered and Authorized by:   Rubye Oaks NP   Signed by:   Lily Velasquez NP on 07/28/2009   Method used:   Print then Give to Patient   RxID:   [id number] BUDESONIDE 0.25 MG/2ML SUSP (BUDESONIDE) 1 neb two times a day  #60 x 11   Entered and Authorized by:   Rubye Oaks NP   Signed by:   Rubye Oaks NP on 07/28/2009    Method used:   Print then Give to Patient   RxID:   [id number]    CardioPerfect Spirometry  ID: [id number] Patient: [name], [name] DOB: [birth year] Age: 28 Years Old Sex: Male Race: White Physician: Sherley Mckenney Height: 62 Weight: 166 Status: Unconfirmed Past Medical History:  Allergic Rhinitis Asthma Coronary Heart Disease- no MI Hypertension Sleep Apnea- NPSG  AHI 22/hr 10/06/01 Alcoholism Depression Hyperlipidemia Pneumonia  Recorded: 07/28/2009 09:56 AM  Parameter  Measured Predicted %Predicted FVC     2.13        2.90        73.40 FEV1     1.45        2.04        70.80 FEV1%   68.07        72.36        94.10 PEF    3.52        5.98        58.90   Interpretation:

## 2010-05-12 NOTE — Assessment & Plan Note (Signed)
Summary: ROV///KP   Primary Provider/Referring Provider:  [facility]  CC:  Follow up visit-allergies..  History of Present Illness:   September 09, 2009--Presents for 6 week follow up - asthma.  states breathing is doing well, no complaints.  states has been recently diagnosed with mild  glaucoma and has had had some skin cancers removed since last OV. He feels the best he has in a long time. His Baseline- mows grass-push mower-1 acre lot., gardens, flowers. Is considering holding spiriva to see if he really needs this one. He is on numerous meds.  Denies chest pain, dyspnea, orthopnea, hemoptysis, fever, n/v/d, edema, headache,recent travel   September 30, 2009-  Asthma, allergic rhinitis, OSA, ................................Marland Kitchenwife here He was treated by NP over last 2 months and is feeling much better. She put him on otc prilosec and that also seems to have helped cough. He was much better until a week ago when he began needing to use his rescue inhaler again. More wheeze and exertional dypnea. Denies fever, sore throat, purulent. This week using both nebulizer and rescue inhaler. doesn't feel reflux. He started prednisone yesterday- taper. We discussed it and will extend it if needed.  December 30, 2009- Asthma, allergic rhinitis, OSA, GERD.......................Marland Kitchenwife here Fairly stable through the summer. In the last 2 days he has been wheezing and his routine use of nebulizer isn't clearing it. This may correspond to onset of rainy weather. Nose is also more congested, running and sneezing. He is frightened by potential for rapid progression of wheezing dyspnea. Wife assures me that he is compliant with his regimen.   Asthma History    Asthma Control Assessment:    Age range: 12+ years    Symptoms: >2 days/week    Nighttime Awakenings: 0-2/month    Interferes w/ normal activity: no limitations    SABA use (not for EIB): >2 days/week    FEV1: 1.45 liters (today)    FEV1  Pred: 2.04 liters (today)    Asthma Control Assessment: Not Well Controlled   Preventive Screening-Counseling & Management  Alcohol-Tobacco     Alcohol drinks/day: 0     Alcohol Counseling: not indicated; patient does not drink     Smoking Status: quit     Year Quit: 1981     Cans of tobacco/week: no     Passive Smoke Exposure: yes     Tobacco Counseling: to remain off tobacco products  Current Medications (verified): 1)  Spiriva Handihaler 18 Mcg Caps (Tiotropium Bromide Monohydrate) .Marland Kitchen.. 1 Daily 2)  Budesonide 0.25 Mg/43ml Susp (Budesonide) .Marland Kitchen.. 1 Neb Two Times A Day 3)  Albuterol Sulfate (2.5 Mg/33ml) 0.083% Nebu (Albuterol Sulfate) .Marland Kitchen.. 1 Vial Via Hhn Two Times A Day 4)  Diovan Hct 160-12.5 Mg  Tabs (Valsartan-Hydrochlorothiazide) .... Take 1 Tablet By Mouth Once A Day 5)  Klor-Con M20 20 Meq Cr-Tabs (Potassium Chloride Crys Cr) .... Take 1 Tablet By Mouth Once A Day 6)  Clonidine Hcl 0.1 Mg Tabs (Clonidine Hcl) .Marland Kitchen.. 1 Tab By Mouth  Every Morning and 2 Tabs At Bedtime 7)  Pravastatin Sodium 80 Mg Tabs (Pravastatin Sodium) .... One Daily in The Evening 8)  Adult Aspirin Low Strength 81 Mg  Tbdp (Aspirin) .... Take 1 By Mouth Once Daily 9)  Phillips Colon Health  Caps (Probiotic Product) .... Take 1 Capsule By Mouth Once A Day 10)  Zyrtec Allergy 10 Mg Tabs (Cetirizine Hcl) .... Take 1 Tab By Mouth At Bedtime 11)  Prilosec 20 Mg Cpdr (Omeprazole) .Marland KitchenMarland KitchenMarland Kitchen  1 Capsule Daily Before Meal 12)  Cpap 9 Cwp .... Wear At Bedtime 13)  Allergy Vaccine 1:10 Go (W-E) .... Every Friday 14)  Mucinex Dm 30-600 Mg Xr12h-Tab (Dextromethorphan-Guaifenesin) .... Take 1-2 Tablets Every 12 Hours As Needed 15)  Tessalon 200 Mg Caps (Benzonatate) .Marland Kitchen.. 1 Capsule By Mouth Every 8 Hours As Needed 16)  Albuterol Sulfate (2.5 Mg/14ml) 0.083% Nebu (Albuterol Sulfate) .Marland Kitchen.. 1 Neb in Soham Every 3 Hours As Needed 17)  Proair Hfa 108 (90 Base) Mcg/act  Aers (Albuterol Sulfate) .... 2 Puffs Every 3 Hours As  Needed 18)  Nitrostat 0.4 Mg Subl (Nitroglycerin) .Marland Kitchen.. 1 Under Tongue Every 5 Minutes X3 As Needed For Chest Pain 19)  Provigil 100 Mg Tabs (Modafinil) .Marland Kitchen.. 1 Daily If Needed 20)  Xalatan 0.005 % Soln (Latanoprost) .Marland Kitchen.. 1 Drop Each Eye Once Daily 21)  Amlodipine Besylate 5 Mg Tabs (Amlodipine Besylate) .... 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  The patient denies shortness of breath at rest, productive cough, non-productive cough, coughing up blood, chest pain, irregular heartbeats, acid heartburn, indigestion, loss of appetite, weight change, abdominal pain, difficulty swallowing, sore throat, tooth/dental problems, headaches, hand/feet swelling, rash, change in color of mucus, and fever.    Vital Signs:  Patient profile:   77 year old male Height:      62 inches Weight:      160 pounds BMI:     29.37 O2 Sat:      98 % on Room air Pulse rate:   73 / minute BP sitting:   126 / 62  (left arm) Cuff size:   regular  Vitals Entered By: Reynaldo Minium CMA (December 30, 2009 2:09 PM)  O2 Flow:  Room air CC: Follow up visit-allergies.   Physical Exam  Additional Exam:  Additional Exam:  General: A/Ox3; pleasant and cooperative, NAD, hard of hearing,  SKIN: no rash, lesions, old tatoo NODES: no lymphadenopathy  HEENT: [place]onson/AT, EOM- WNL, Conjuctivae- clear, PERRLA, TM-bilateral hearing aids, Nose- clear, Throat- clear and wnl, Mallampati  II NECK: Supple w/ fair ROM, JVD- none, normal carotid impulses w/o bruits Thyroid-  CHEST: wheeze, partly from throat/ pseudo wheeze HEART: RRR, no m/g/r heard ABDOMEN: Soft and nl; WUJ:WJXB, nl pulses, no edema  NEURO: Grossly intact to observation     Pre-Spirometry FEV1    Value: 1.45 L     Pred: 2.04 L     Impression & Recommendations:  Problem # 1:  ASTHMA (ICD-493.90) He gets anxious if his breathing declines and I think this brings on a bit of pseudowheeze. He is having some real wheezing after doing well all summer and has been very compliant with his meds. We will give neb and pred taper here. He is headed for the beach next week. Flu vax.  Problem # 2:  ALLERGIC RHINITIS (ICD-477.9)  Exacerbation parallels his chest exacerbation and should respond to weather change and the prednisone. His updated medication list for this problem includes:     Zyrtec Allergy 10 Mg Tabs (Cetirizine hcl) .Marland Kitchen... Take 1 tab by mouth at bedtime  Problem # 3:  SLEEP APNEA (ICD-780.57)  He continues with good compliance and control, doing well.  Problem # 4:  GERD (ICD-530.81) He continues acid blocker therapy His updated medication list for this problem includes:    Prilosec 20 Mg Cpdr (Omeprazole) .Marland Kitchen... 1 capsule daily before meal  Medications Added to Medication List This Visit: 1)  Prednisone 10 Mg Tabs (Prednisone) .Marland Kitchen.. 1 tab four times daily x 2 days, 3 times daily x 2 days, 2 times daily x 2 days, 1 time daily x 2 days . repeat if needed  Other Orders: Est. Patient Level III (14782) Xopenex 1.25mg  (N5621) Nebulizer Tx (30865) Flu Vaccine 66yrs + [phone]) Administration Flu vaccine - MCR (G2952)  Patient Instructions: 1)  Keep February appointment- earlier if needed 2)  Script prednisone sent to drug store 3)  Flu vax 4)  neb xop 1.25 Prescriptions: PREDNISONE 10 MG TABS (PREDNISONE) 1 tab four times daily x 2 days, 3 times daily x 2 days, 2 times daily x 2 days, 1 time daily x 2 days . Repeat if needed  #40 x 0   Entered and Authorized by:   Waymon Budge MD   Signed by:   Waymon Budge MD on 12/30/2009   Method used:   Electronically to        [id number] (retail)       [address]       [place], [place]  [address]       Ph: [phone] or [phone]       Fax: [phone]   RxID:   [id number]      Medication Administration  Medication # 1:    Medication: Xopenex 1.25mg     Diagnosis: ASTHMA (ICD-493.90)    Dose: 1.25    Route: inhaled    Exp Date: 11/2010    Lot #: [id number]    Mfr: [email or web address]    Patient tolerated medication without complications    Given by: Randell Loop CMA (December 30, 2009 2:45 PM)  Orders Added: 1)  Est. Patient Level III [99213] 2)  Xopenex 1.25mg  [Z6010] 3)  Nebulizer Tx [94640] 4)  Flu Vaccine 55yrs + [93235] 5)  Administration  Flu vaccine - MCR [G0008] Flu Vaccine Consent Questions  Do you have a history of severe allergic reactions to this vaccine? no    Any prior history of allergic reactions to egg and/or gelatin? no    Do you have a sensitivity to the preservative Thimersol? no    Do you have a past history of Guillan-Barre Syndrome? no    Do you currently have an acute febrile illness? no    Have you ever had a severe reaction to latex? no    Vaccine information given and explained to patient? yes    Are you currently pregnant? no    Lot Number:[id number]   Exp Date:10/07/2009   Site Given  Left Deltoid IMPatient Level III [16109] 2)  Xopenex 1.25mg  [U0454] 3)  Nebulizer Tx [94640] 4)  Flu Vaccine 4yrs + [09811] 5)  Administration Flu vaccine - MCR [G0008]     .lbmedflu

## 2010-05-12 NOTE — Assessment & Plan Note (Signed)
Summary: NEW/ SECURE HORIZIONS/ HYPERTENSION/NWS   Vital Signs:  Patient profile:   77 year old male Height:      62 inches (157.48 cm) Weight:      148.8 pounds (67.64 kg) O2 Sat:      96 % on Room air Temp:     98.0 degrees F (36.67 degrees C) oral Pulse rate:   73 / minute BP sitting:   110 / 62  (left arm) Cuff size:   regular  Vitals Entered By: Orlan Leavens RMA (November 19, 2009 9:33 AM)  O2 Flow:  Room air CC: New patient Is Patient Diabetic? No Pain Assessment Patient in pain? no        Primary Care Provider:  [facility]  CC:  New patient.  History of Present Illness: new pt to me and PC divison, but known to our practice - here to est care with PC - prev at [facility]- tysinger/lonie here with wife who provdes much of the hx  1) CAD s/p CABG 2008 - follows with cards for same - upcoming treadmill stress test in 11/2009 - -reports compliance with ongoing medical treatment and no changes in medication dose or frequency. denies adverse side effects related to current therapy. no CP or anginal symptoms   2) HTN - reports compliance with ongoing medical treatment and no changes in medication dose or frequency. denies adverse side effects related to current therapy. no edema or HA or weakness  3) dyslipidemia - changed from simva to prava 6/2011due to FDA warnings - reports compliance with ongoing medical treatment and no other changes in medication dose or frequency. denies adverse side effects related to current therapy. no muscle pain or weakness  4) asthma, remote tobacco - follows with pulm for same - frequent flares related to weather change - reports compliance with ongoing nubulizer treatment and no changes in medication dose or frequency. denies adverse side effects related to current therapy.   Preventive Screening-Counseling & Management  Alcohol-Tobacco     Alcohol drinks/day: 0     Alcohol Counseling: not indicated; patient does not drink  Smoking Status: quit     Year Quit: 1981     Cans of tobacco/week: no     Passive Smoke Exposure: yes     Tobacco Counseling: to remain off tobacco products  Caffeine-Diet-Exercise     Depression Counseling: not indicated; screening negative for depression  Safety-Violence-Falls     Seat Belt Counseling: not indicated; patient wears seat belts     Violence in the Home: no risk noted     Fall Risk Counseling: not indicated; no significant falls noted  Clinical Review Panels:  Prevention   Last Colonoscopy:  Location:  [facility].  (10/30/2007)  Immunizations   Last Flu Vaccine:  Historical (01/08/2001)   Last Pneumovax:  Historical (02/20/2001)  CBC   WBC:  8.8 (10/22/2007)   RBC:  4.29 (10/22/2007)   Hgb:  13.3 (10/22/2007)   Hct:  38.5 (10/22/2007)   Platelets:  356 (10/22/2007)   MCV  89.6 (10/22/2007)   MCHC  34.5 (10/22/2007)   RDW  14.9 (10/22/2007)   PMN:  84.7 (10/22/2007)   Lymphs:  7.0 (10/22/2007)   Monos:  5.6 (10/22/2007)   Eosinophils:  2.2 (10/22/2007)   Basophil:  0.5 (10/22/2007)  Complete Metabolic Panel   Glucose:  122 (10/22/2007)   Sodium:  136 (10/22/2007)   Potassium:  3.4 (10/22/2007)   Chloride:  94 (10/22/2007)   CO2:  32 (  10/22/2007)   BUN:  18 (11/10/2008)   Creatinine:  0.9 (11/10/2008)   Albumin:  3.3 (12/03/2007)   Total Protein:  6.5 (12/03/2007)   Calcium:  9.0 (10/22/2007)   Total Bili:  0.9 (12/03/2007)   Alk Phos:  61 (12/03/2007)   SGPT (ALT):  32 (12/03/2007)   SGOT (AST):  31 (12/03/2007)   Current Medications (verified): 1)  Spiriva Handihaler 18 Mcg Caps (Tiotropium Bromide Monohydrate) .Marland Kitchen.. 1 Daily 2)  Budesonide 0.25 Mg/41ml Susp (Budesonide) .Marland Kitchen.. 1 Neb Two Times A Day 3)  Albuterol Sulfate (2.5 Mg/10ml) 0.083% Nebu (Albuterol Sulfate) .Marland Kitchen.. 1 Vial Via Hhn Two Times A Day 4)  Diovan Hct 160-12.5 Mg  Tabs (Valsartan-Hydrochlorothiazide) .... Take 1 Tablet By Mouth Once A Day 5)  Klor-Con M20 20 Meq Cr-Tabs  (Potassium Chloride Crys Cr) .... Take 1 Tablet By Mouth Once A Day 6)  Clonidine Hcl 0.1 Mg Tabs (Clonidine Hcl) .Marland Kitchen.. 1 Tab By Mouth  Every Morning and 2 Tabs At Bedtime 7)  Pravastatin Sodium 80 Mg Tabs (Pravastatin Sodium) .... One Daily in The Evening 8)  Adult Aspirin Low Strength 81 Mg  Tbdp (Aspirin) .... Take 1 By Mouth Once Daily 9)  Phillips Colon Health  Caps (Probiotic Product) .... Take 1 Capsule By Mouth Once A Day 10)  Zyrtec Allergy 10 Mg Tabs (Cetirizine Hcl) .... Take 1 Tab By Mouth At Bedtime 11)  Prilosec 20 Mg Cpdr (Omeprazole) .Marland Kitchen.. 1 Capsule Daily Before Meal 12)  Cpap 9 Cwp .... Wear At Bedtime 13)  Allergy Vaccine 1:10 Go (W-E) .... Every Friday 14)  Mucinex Dm 30-600 Mg Xr12h-Tab (Dextromethorphan-Guaifenesin) .... Take 1-2 Tablets Every 12 Hours As Needed 15)  Tessalon 200 Mg Caps (Benzonatate) .Marland Kitchen.. 1 Capsule By Mouth Every 8 Hours As Needed 16)  Albuterol Sulfate (2.5 Mg/14ml) 0.083% Nebu (Albuterol Sulfate) .Marland Kitchen.. 1 Neb in [place] Every 3 Hours As Needed 17)  Proair Hfa 108 (90 Base) Mcg/act  Aers (Albuterol Sulfate) .... 2 Puffs Every 3 Hours As Needed 18)  Nitrostat 0.4 Mg Subl (Nitroglycerin) .Marland Kitchen.. 1 Under Tongue Every 5 Minutes X3 As Needed For Chest Pain 19)  Provigil 100 Mg Tabs (Modafinil) .Marland Kitchen.. 1 Daily If Needed 20)  Xalatan 0.005 % Soln (Latanoprost) .Marland Kitchen.. 1 Drop Each Eye Once Daily 21)  Amlodipine Besylate 5 Mg Tabs (Amlodipine Besylate) .... Take 1 By Mouth At Bedtime  Allergies (verified): 1)  Codeine  Past History:  Past Medical History: Allergic Rhinitis Asthma --July 28, 2009-Spirometry shows FEV1 1.45 (70%) Coronary Heart Disease- no MI Hypertension Sleep Apnea- NPSG  AHI 22/hr 10/06/01 Alcoholism hx Depression Hyperlipidemia Pneumonia GERD  MD roster: pulm - young cards - hochrein (prev tysinger) GI - perry derm - gould uro - kimbrough  Past Surgical History: CABG (11/08-left internal mammary artery to        the left anterior  descending, saphenous vein graft to diagonal,       sequential saphenous vein graft to posterior descending and       posterior lateral, saphenous vein graft to obtuse marginal       Endoscopic vein harvest of the right leg greater saphenous vein) Hernia Surgery (12/03/2007) Tonsillectomy   Family History: Mother- deceased age 41; CHF Father- deceased age 44; heart attack Sister- COPD; Rheumatism, asthma, emphysema Brother- heart transplant   Social History: Patient states former smoker. 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 Married , lives with wife 2 children Alcohol Use - no Daily Caffeine Use Illicit Drug  Use - no   Review of Systems       see HPI above. I have reviewed all other systems and they were negative.   Physical Exam  General:  Well developed, well nourished, in no acute distress. wife at side Head:  normocephalic and atraumatic Eyes:  vision grossly intact; pupils equal, round and reactive to light.  conjunctiva and lids normal.   wears glasses Ears:  normal pinnae bilaterally, without erythema, swelling, or tenderness to palpation. TMs clear, without effusion, or cerumen impaction. Hearing mildly diminished bilaterally (wears hearing aid) Mouth:  teeth and gums in good repair; mucous membranes moist, without lesions or ulcers. oropharynx clear without exudate, no erythema.  Lungs:  normal respiratory effort, no intercostal retractions or use of accessory muscles; normal breath sounds bilaterally - no crackles and no wheezes.    Heart:  normal rate, regular rhythm, no murmur, and no rub. BLE without edema. Abdomen:  soft, non-tender, normal bowel sounds, no distention; no masses and no appreciable hepatomegaly or splenomegaly.   Msk:  Back normal, normal gait. Muscle strength and tone normal. Neurologic:  alert & oriented X3 and cranial nerves II-XII symetrically intact.  strength normal in all extremities, sensation intact to light touch, and gait normal.  speech fluent without dysarthria or aphasia; follows commands with good comprehension.  Skin:  no rashes, vesicles, ulcers, or erythema. No nodules or irregularity to palpation. tatooed on arms Psych:  Oriented X3, memory intact for recent and remote, normally interactive, good eye contact, not anxious appearing, not depressed appearing, and not agitated.      Impression & Recommendations:  Problem # 1:  CORONARY HEART DISEASE (ICD-414.00)  The following medications were removed from the medication list:    Amlodipine Besylate 2.5 Mg Tabs (Amlodipine besylate) .Marland Kitchen... Take 1 tab by mouth at bedtime His updated medication list for this problem includes:    Diovan Hct 160-12.5 Mg Tabs (Valsartan-hydrochlorothiazide) .Marland Kitchen... Take 1 tablet by mouth once a day    Clonidine Hcl 0.1 Mg Tabs (Clonidine hcl) .Marland Kitchen... 1 tab by mouth  every morning and 2 tabs at bedtime    Adult Aspirin Low Strength 81 Mg Tbdp (Aspirin) .Marland Kitchen... Take 1 by mouth once daily    Nitrostat 0.4 Mg Subl (Nitroglycerin) .Marland Kitchen... 1 under tongue every 5 minutes x3 as needed for chest pain    Amlodipine Besylate 5 Mg Tabs (Amlodipine besylate) .Marland Kitchen... Take 1 by mouth at bedtime  will send for prior records from tysinger's office status post CABG 2008. cards planning to screen with an exercise treadmill test 11/2009. We will also continue with risk reduction.  Problem # 2:  DYSLIPIDEMIA (ICD-272.4)  His updated medication list for this problem includes:    Pravastatin Sodium 80 Mg Tabs (Pravastatin sodium) ..... One daily in the evening  recent discontinued simvastatin and start pravastatin 80 mg. cards checking a lipid profile and liver enzymes in 11/2009  Labs Reviewed: SGOT: 31 (12/03/2007)   SGPT: 32 (12/03/2007)  Problem # 3:  HYPERTENSION (ICD-401.9)  The following medications were removed from the medication list:    Amlodipine Besylate 2.5 Mg Tabs (Amlodipine besylate) .Marland Kitchen... Take 1 tab by mouth at bedtime His updated medication  list for this problem includes:    Diovan Hct 160-12.5 Mg Tabs (Valsartan-hydrochlorothiazide) .Marland Kitchen... Take 1 tablet by mouth once a day    Clonidine Hcl 0.1 Mg Tabs (Clonidine hcl) .Marland Kitchen... 1 tab by mouth  every morning and 2 tabs at bedtime    Amlodipine  Besylate 5 Mg Tabs (Amlodipine besylate) .Marland Kitchen... Take 1 by mouth at bedtime  BP today: 110/62 Prior BP: 107/58 (10/04/2009)  Labs Reviewed: K+: 3.4 (10/22/2007) Creat: : 0.9 (11/10/2008)     Problem # 4:  ASTHMA (ICD-493.90) freq exac - explained we will be happy to see and help pt with same as needed - doing well today The following medications were removed from the medication list:    Prednisone 10 Mg Tabs (Prednisone) .Marland Kitchen... Take 4 each day for 2 days, then 3 each day for 2 days, then 2 each day for 2 days, then 1 each day for 2 days, then stop His updated medication list for this problem includes:    Spiriva Handihaler 18 Mcg Caps (Tiotropium bromide monohydrate) .Marland Kitchen... 1 daily    Budesonide 0.25 Mg/51ml Susp (Budesonide) .Marland Kitchen... 1 neb two times a day    Albuterol Sulfate (2.5 Mg/65ml) 0.083% Nebu (Albuterol sulfate) .Marland Kitchen... 1 vial via hhn two times a day    Albuterol Sulfate (2.5 Mg/25ml) 0.083% Nebu (Albuterol sulfate) .Marland Kitchen... 1 neb in vial every 3 hours as needed    Proair Hfa 108 (90 Base) Mcg/act Aers (Albuterol sulfate) .Marland Kitchen... 2 puffs every 3 hours as needed  Problem # 5:  GERD (ICD-530.81)  infreq symptoms - cont same tx His updated medication list for this problem includes:    Prilosec 20 Mg Cpdr (Omeprazole) .Marland Kitchen... 1 capsule daily before meal  EGD: Location: [facility]   (10/30/2007)  Labs Reviewed: Hgb: 13.3 (10/22/2007)   Hct: 38.5 (10/22/2007) Time spent with patient and wife 45 minutes, more than 50% of this time was spent counseling patient on role of PCP and medication/med hx review - also plans to obtain prior records  Complete Medication List: 1)  Spiriva Handihaler 18 Mcg Caps (Tiotropium bromide  monohydrate) .Marland Kitchen.. 1 daily 2)  Budesonide 0.25 Mg/20ml Susp (Budesonide) .Marland Kitchen.. 1 neb two times a day 3)  Albuterol Sulfate (2.5 Mg/32ml) 0.083% Nebu (Albuterol sulfate) .Marland Kitchen.. 1 vial via hhn two times a day 4)  Diovan Hct 160-12.5 Mg Tabs (Valsartan-hydrochlorothiazide) .... Take 1 tablet by mouth once a day 5)  Klor-con M20 20 Meq Cr-tabs (Potassium chloride crys cr) .... Take 1 tablet by mouth once a day 6)  Clonidine Hcl 0.1 Mg Tabs (Clonidine hcl) .Marland Kitchen.. 1 tab by mouth  every morning and 2 tabs at bedtime 7)  Pravastatin Sodium 80 Mg Tabs (Pravastatin sodium) .... One daily in the evening 8)  Adult Aspirin Low Strength 81 Mg Tbdp (Aspirin) .... Take 1 by mouth once daily 9)  Phillips Colon Health Caps (Probiotic product) .... Take 1 capsule by mouth once a day 10)  Zyrtec Allergy 10 Mg Tabs (Cetirizine hcl) .... Take 1 tab by mouth at bedtime 11)  Prilosec 20 Mg Cpdr (Omeprazole) .Marland Kitchen.. 1 capsule daily before meal 12)  Cpap 9 Cwp  .... Wear at bedtime 13)  Allergy Vaccine 1:10 Go (w-e)  .... Every friday 14)  Mucinex Dm 30-600 Mg Xr12h-tab (Dextromethorphan-guaifenesin) .... Take 1-2 tablets every 12 hours as needed 15)  Tessalon 200 Mg Caps (Benzonatate) .Marland Kitchen.. 1 capsule by mouth every 8 hours as needed 16)  Albuterol Sulfate (2.5 Mg/84ml) 0.083% Nebu (Albuterol sulfate) .Marland Kitchen.. 1 neb in vial every 3 hours as needed 17)  Proair Hfa 108 (90 Base) Mcg/act Aers (Albuterol sulfate) .... 2 puffs every 3 hours as needed 18)  Nitrostat 0.4 Mg Subl (Nitroglycerin) .Marland Kitchen.. 1 under tongue every 5 minutes x3 as needed for  chest pain 19)  Provigil 100 Mg Tabs (Modafinil) .Marland Kitchen.. 1 daily if needed 20)  Xalatan 0.005 % Soln (Latanoprost) .Marland Kitchen.. 1 drop each eye once daily 21)  Amlodipine Besylate 5 Mg Tabs (Amlodipine besylate) .... Take 1 by mouth at bedtime  Patient Instructions: 1)  it was good to see you today. 2)  medications reviewed - no changes recomended today 3)  will send for records and prior labs/tests  from lonie/tysinger's office to review here - 4)  continue to be active as you are doing! 5)  Please schedule a follow-up appointment in 4-6 months for general review and checkin, call sooner if problems.

## 2010-05-12 NOTE — Medication Information (Signed)
Summary: Enrollment application/[employer]  Enrollment application/[employer]   Imported By: Sherian Rein 07/19/2009 12:28:52  _____________________________________________________________________  External Attachment:    Type:   Image     Comment:   External Document

## 2010-05-12 NOTE — Assessment & Plan Note (Signed)
Summary: Acute NP office visit - asthma flare   Primary Provider/Referring Provider:  Charolette Child, MD  CC:  asthma flare with wheezing, dyspnea, and cough occ producing clear mucus x3days.  History of Present Illness:  March 11, 2009- Asthma, allergic rhinitis, cough, OSA..............................[name] here He is feeling pretty well today. Wheezed some this morning and last night. He regards this as his baseline. IgE of 19 was not in range to consider Xolair as discussed.  July 09, 2009- Asthma, allergic rhinitis, cough, OSA.........................[name] here Struggling with increased asthma, dyspnea, chest tightness since February. Much cough interfering with sleep. Urgent Care gave depo w/out help. Denies productive cough, fever, chest pain. Steady wheeze. We had called in prednisone which helped some. Finished last prednisone over a week ago. He dropped off Spiriva but remembers it helping They ask refill on provigil to help with safe driving  July 19, 2009 --Presents for an acute office visit. Complains of asthma flare with wheezing, dyspnea, cough occ producing clear mucus x 2 months. He is here with wife today. They are very confused with meds. He has stopped advair, only using spiriva as needed-not on regular basis. He is not taking any PPI which MAR indicates he is suppose to be on Dexilant. He c/o of persistent wheezing, cough for last 2 months. Has not been able to exercise as he usually does for several weeks. He is very upset that he is not getting better, has been seen at [facility] as well. Received deop medrol shot last visit. with some helps. Xopenex nebs his wheezing. Denies chest pain, dyspnea, orthopnea, hemoptysis, fever, n/v/d, edema, headache,recent travel or calf pain, swelling. discolored mucus. ;    Medications Prior to Update: 1)  Cpap .... @ 9 Cwp 2)  Allergy Vaccine 1:10 Go (W-E) .... . 3)  Advair Diskus 100-50 Mcg/dose  Misc (Fluticasone-Salmeterol)  .... Inhale 1 Puff Two Times A Day 4)  Spiriva Handihaler 18 Mcg Caps (Tiotropium Bromide Monohydrate) .Stephen Kitchen.. 1 Daily 5)  Budesonide 0.25 Mg/55ml Susp (Budesonide) .Stephen Kitchen.. 1 Neb Two Times A Day 6)  Adult Aspirin Low Strength 81 Mg  Tbdp (Aspirin) .... Take 1 By Mouth Once Daily 7)  Clonidine Hcl 0.3 Mg  Tabs (Clonidine Hcl) .... Take 1 Tablet in The Am and 2 Tablets in The Pm 8)  Dexilant 60 Mg Cpdr (Dexlansoprazole) .... Take One By Mouth Every Morning 9)  Xopenex 1.25 Mg/27ml Nebu (Levalbuterol Hcl) .Stephen Kitchen.. 1 Three Times A Day As Needed Neb 10)  Amiodarone Hcl 200 Mg Tabs (Amiodarone Hcl) .... Take 1 Tablet Every Other Day 11)  Benzonatate 200 Mg Caps (Benzonatate) .... Take One Capsule By Mouth Every 4 Hours As Needed For Cough 12)  Diovan Hct 160-12.5 Mg  Tabs (Valsartan-Hydrochlorothiazide) .... Take 1 Tablet By Mouth Two Times A Day 13)  Klor-Con 20 Meq Pack (Potassium Chloride) .... Take 1 Tablet By Mouth Once A Day 14)  Proair Hfa 108 (90 Base) Mcg/act  Aers (Albuterol Sulfate) .... 2 Puffs Four Times A Day As Needed 15)  Nitroquick 0.4 Mg  Subl (Nitroglycerin) .... Use As Directed As Needed 16)  Provigil 100 Mg Tabs (Modafinil) .Stephen Kitchen.. 1 Daily If Needed  Current Medications (verified): 1)  Cpap .... @ 9 Cwp 2)  Allergy Vaccine 1:10 Go (W-E) .... . 3)  Advair Diskus 100-50 Mcg/dose  Misc (Fluticasone-Salmeterol) .... Inhale 1 Puff Two Times A Day 4)  Proair Hfa 108 (90 Base) Mcg/act  Aers (Albuterol Sulfate) .... 2 Puffs Four Times  A Day As Needed 5)  Adult Aspirin Low Strength 81 Mg  Tbdp (Aspirin) .... Take 1 By Mouth Once Daily 6)  Clonidine Hcl 0.3 Mg  Tabs (Clonidine Hcl) .... Take 1 Tablet in The Am and 2 Tablets in The Pm 7)  Nitroquick 0.4 Mg  Subl (Nitroglycerin) .... Use As Directed As Needed 8)  Amiodarone Hcl 200 Mg Tabs (Amiodarone Hcl) .... Take 1 Tablet Every Other Day 9)  Diovan Hct 160-12.5 Mg  Tabs (Valsartan-Hydrochlorothiazide) .... Take 1 Tablet By Mouth Two Times A Day 10)   Klor-Con 20 Meq Pack (Potassium Chloride) .... Take 1 Tablet By Mouth Once A Day 11)  Provigil 100 Mg Tabs (Modafinil) .Stephen Kitchen.. 1 Daily If Needed 12)  Dexilant 60 Mg Cpdr (Dexlansoprazole) .... Take One By Mouth Every Morning 13)  Benzonatate 200 Mg Caps (Benzonatate) .... Take One Capsule By Mouth Every 4 Hours As Needed For Cough 14)  Xopenex 1.25 Mg/39ml Nebu (Levalbuterol Hcl) .Stephen Kitchen.. 1 Three Times A Day As Needed Neb 15)  Spiriva Handihaler 18 Mcg Caps (Tiotropium Bromide Monohydrate) .Stephen Kitchen.. 1 Daily 16)  Budesonide 0.25 Mg/43ml Susp (Budesonide) .Stephen Kitchen.. 1 Neb Two Times A Day  Allergies (verified): 1)  Codeine  Past History:  Past Medical History: Last updated: 12/03/2007 Allergic Rhinitis Asthma Coronary Heart Disease- no MI Hypertension Sleep Apnea- NPSG  AHI 22/hr 10/06/01 Alcoholism Depression Hyperlipidemia Pneumonia  Past Surgical History: Last updated: 07/29/2008 CABG 11/08-5v Hernia Surgery (12/03/2007)  Family History: Last updated: 2007-10-06 Mother- deceased age 21; CHF Father- deceased age 49; heart attack Sister- living age 64; COPD; Rheumatism, asthma, emphysema Brother- living age 27; heart transplant  Social History: Last updated: 12/03/2007 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 children Alcohol Use - no Daily Caffeine Use Illicit Drug Use - no  Risk Factors: Caffeine Use: 3 (12/03/2007)  Risk Factors: Smoking Status: quit (12/03/2007) Cans of tobacco/wk: no (12/03/2007)  Review of Systems      See HPI  Vital Signs:  Patient profile:   77 year old male Height:      62 inches Weight:      164.56 pounds BMI:     30.21 O2 Sat:      98 % on Room air Temp:     97.9 degrees F oral Pulse rate:   92 / minute BP sitting:   126 / 74  (left arm) Cuff size:   regular  Vitals Entered By: [name] CNA (July 19, 2009 10:05 AM)  O2 Flow:  Room air CC: asthma flare with wheezing, dyspnea, cough occ producing  clear mucus x3days Is Patient Diabetic? No Comments Medications reviewed with patient Daytime contact number verified with patient. [name] CNA  July 19, 2009 10:05 AM    Physical Exam  Additional Exam:  General: A/Ox3; pleasant and cooperative, NAD, hard of hearing, obese SKIN: no rash, lesions NODES: no lymphadenopathy HEENT: [place]/AT, EOM- WNL, Conjuctivae- clear, PERRLA, TM-bilateral hearing aids, Nose- clear, Throat- clear and wnl NECK: Supple w/ fair ROM, JVD- none, normal carotid impulses w/o bruits Thyroid- nonpalplable  CHEST: I&E wheeze diffusely.loud upper airway psuedowheezing.  HEART: RRR, no m/g/r heard ABDOMEN: Soft and nl; ZOX:WRUE, nl pulses, no edema  NEURO: Grossly intact to observation      Impression & Recommendations:  Problem # 1:  ASTHMA (ICD-493.90)  Recurrent exacerbations unclear etiology. Will check xray today.  REC:  Take Spiriva once daily -everyday Begin Budesonide 0.25mg  neb two times a  day  Begin Xopenex Neb four times a day , may also use as needed every 3 hr if needed.  Mucinex DM two times a day as needed cough/congestion  Begn Zyrtec 10mg  at bedtime  Begin Dexilant 60mg  or Prilosec 20mg  once daily  follow up 1 week, bring all your meds with you to next visit. (including nebs, otc med and prescriptions meds, inhalers, etc) follow up 1 week Parrett NP for med calendar.  Prednisone taper over next week  I will call with xray results.   Orders: T-2 View CXR (71020TC) Est. Patient Level IV (16109)  Medications Added to Medication List This Visit: 1)  Xopenex 1.25 Mg/59ml Nebu (Levalbuterol hcl) .Stephen Kitchen.. 1 neb four times a day  and every 3 hrs as needed. 2)  Tessalon 200 Mg Caps (Benzonatate) .Stephen Kitchen.. 1 by mouth three times a day as needed 3)  Prednisone 10 Mg Tabs (Prednisone) .... 4 tabs for 2 days, then 3 tabs for 2 days, 2 tabs for 2 days, then 1 tab for 2 days, then stop  Complete Medication List: 1)  Cpap  .... @ 9 cwp 2)  Allergy  Vaccine 1:10 Go (w-e)  .... . 3)  Spiriva Handihaler 18 Mcg Caps (Tiotropium bromide monohydrate) .Stephen Kitchen.. 1 daily 4)  Budesonide 0.25 Mg/65ml Susp (Budesonide) .Stephen Kitchen.. 1 neb two times a day 5)  Adult Aspirin Low Strength 81 Mg Tbdp (Aspirin) .... Take 1 by mouth once daily 6)  Xopenex 1.25 Mg/83ml Nebu (Levalbuterol hcl) .Stephen Kitchen.. 1 neb four times a day  and every 3 hrs as needed. 7)  Clonidine Hcl 0.3 Mg Tabs (Clonidine hcl) .... Take 1 tablet in the am and 2 tablets in the pm 8)  Dexilant 60 Mg Cpdr (Dexlansoprazole) .... Take one by mouth every morning 9)  Amiodarone Hcl 200 Mg Tabs (Amiodarone hcl) .... Take 1 tablet every other day 10)  Diovan Hct 160-12.5 Mg Tabs (Valsartan-hydrochlorothiazide) .... Take 1 tablet by mouth two times a day 11)  Klor-con 20 Meq Pack (Potassium chloride) .... Take 1 tablet by mouth once a day 12)  Proair Hfa 108 (90 Base) Mcg/act Aers (Albuterol sulfate) .... 2 puffs four times a day as needed 13)  Nitroquick 0.4 Mg Subl (Nitroglycerin) .... Use as directed as needed 14)  Tessalon 200 Mg Caps (Benzonatate) .Stephen Kitchen.. 1 by mouth three times a day as needed 15)  Provigil 100 Mg Tabs (Modafinil) .Stephen Kitchen.. 1 daily if needed 16)  Prednisone 10 Mg Tabs (Prednisone) .... 4 tabs for 2 days, then 3 tabs for 2 days, 2 tabs for 2 days, then 1 tab for 2 days, then stop  Patient Instructions: 1)  Take Spiriva once daily -everyday 2)  Begin Budesonide 0.25mg  neb two times a day  3)  Begin Xopenex Neb four times a day , may also use as needed every 3 hr if needed.  4)  Mucinex DM two times a day as needed cough/congestion  5)  Begn Zyrtec 10mg  at bedtime  6)  Begin Dexilant 60mg  or Prilosec 20mg  once daily  7)  follow up 1 week, bring all your meds with you to next visit. (including nebs, otc med and prescriptions meds, inhalers, etc) 8)  follow up 1 week Parrett NP for med calendar.  9)  Prednisone taper over next week  10)  I will call with xray results.  Prescriptions: PREDNISONE 10 MG  TABS (PREDNISONE) 4 tabs for 2 days, then 3 tabs for 2 days, 2 tabs for 2 days,  then 1 tab for 2 days, then stop  #20 x 0   Entered and Authorized by:   Rubye Oaks NP   Signed by:   [name] NP on 07/19/2009   Method used:   Electronically to        [facility] # [id number]* (retail)       [address]       [place], [place]  [address]       Ph: [phone] or [phone]       Fax: [phone]   RxID:   [id number]   Appended Document: neb and depo documentation    Clinical Lists Changes  Orders: Added new Service order of Admin of Therapeutic Inj  intramuscular or subcutaneous (66440) - Signed Added new Service order of Depo- Medrol 40mg  (J1030) - Signed Added new Service order of Depo- Medrol 80mg  (J1040) - Signed Added new Service order of Nebulizer Tx (34742) - Signed       Medication Administration  Injection # 1:    Medication: Depo- Medrol 80mg     Diagnosis: ASTHMA (ICD-493.90)    Route: IM    Site: LUOQ gluteus    Exp Date: 02-2012    Lot #: [id number]    Mfr: Pharmacia    Patient tolerated injection without complications    Given by: [name] CNA (July 19, 2009 11:13 AM)  Injection # 2:    Medication: Depo- Medrol 40mg     Diagnosis: ASTHMA (ICD-493.90)    Route: IM    Site: LUOQ gluteus    Exp Date: 02-2012    Lot #: [id number]    Mfr: Pharmacia    Patient tolerated injection without complications    Given by: [name] CNA (July 19, 2009 11:13 AM)  Orders Added: 1)  Admin of Therapeutic Inj  intramuscular or subcutaneous [96372] 2)  Depo- Medrol 40mg  [J1030] 3)  Depo- Medrol 80mg  [J1040] 4)  Nebulizer Tx [59563]  Appended Document: xopenex and budesonide refills    Clinical Lists Changes  Medications: Rx of BUDESONIDE 0.25 MG/2ML SUSP (BUDESONIDE) 1 neb two times a day;  #60 x 6;  Signed;  Entered by: [name] CNA;  Authorized by: [name] NP;  Method used: Electronically to [facility]. # [id number]*, [address] [place], [place], [place]  [address], Ph: [phone] or [phone], Fax: [phone] Rx of XOPENEX 1.25 MG/3ML NEBU (LEVALBUTEROL HCL) 1 neb four times a day  and every 3 hrs as needed.;  #100 x 6;  Signed;  Entered by: [name] CNA;  Authorized by: [name] NP;  Method used: Electronically to [facility]. # [id number]*, [address] [place], [place], [place]  [address], Ph: [phone] or [phone], Fax: [phone]    Prescriptions: XOPENEX 1.25 MG/3ML NEBU (LEVALBUTEROL HCL) 1 neb four times a day  and every 3 hrs as needed.  #100 x 6   Entered by:   [name] CNA   Authorized by:   Rubye Oaks NP   Signed by:   [name] CNA on 07/19/2009   Method used:   Electronically to        [facility]. # [id number]* (retail)       [address]       [place], [place]  [address]       Ph: [phone] or [phone]  Fax: [phone]   RxID:   [id number] BUDESONIDE 0.25 MG/2ML SUSP (BUDESONIDE) 1 neb two times a day  #60 x 6   Entered by:   [name] CNA   Authorized by:   Rubye Oaks NP   Signed by:   [name] CNA on 07/19/2009   Method used:   Electronically to        [facility] # [id number]* (retail)       [address]       [place], [place]  [address]       Ph: [phone] or [phone]       Fax: [phone]   RxID:   [id number]

## 2010-05-12 NOTE — Letter (Signed)
Summary: [facility]   Imported By: Lester Rosa Sanchez 12/22/2009 08:41:16  _____________________________________________________________________  External Attachment:    Type:   Image     Comment:   External Document

## 2010-05-12 NOTE — Progress Notes (Signed)
Summary: needs addtional med  Phone Note Call from Patient   Caller: Patient [phone] Reason for Call: Talk to Nurse Summary of Call: pt's diovan was called in for 1 tab 1 time a day, was to be 1 tab 2 times a day, so he will be short 15 days can they get another rx for the rest of it and have the rx coreected with the pharmacy? [facility] Initial call taken by: Glynda Jaeger,  January 20, 2010 10:30 AM  Follow-up for Phone Call        Spoke wife she states pt has been taking RX two times a day since his surgery. Resent in rx. Marrion Coy, CNA  January 20, 2010 10:42 AM  Follow-up by: Marrion Coy, CNA,  January 20, 2010 10:42 AM    New/Updated Medications: DIOVAN HCT 160-12.5 MG  TABS (VALSARTAN-HYDROCHLOROTHIAZIDE) 1 by mouth two times a day Prescriptions: DIOVAN HCT 160-12.5 MG  TABS (VALSARTAN-HYDROCHLOROTHIAZIDE) 1 by mouth two times a day  #60 x 6   Entered by:   Marrion Coy, CNA   Authorized by:   Rollene Rotunda, MD, [facility]   Signed by:   Marrion Coy, CNA on 01/20/2010   Method used:   Electronically to        [id number] (retail)       [address]       [place], [place]  [address]       Ph: [phone]       Fax: [phone]   RxID:   [id number]

## 2010-05-12 NOTE — Progress Notes (Signed)
Summary: pt assistance form  Phone Note Call from Patient Call back at Home Phone [phone]   Caller: Patient Call For: young Summary of Call: pt brought back [employer] Assistance program form for Katie - It's at [employer]  Gave form to Crystal. Initial call taken by: Eugene Gavia,  July 14, 2009 9:53 AM  Follow-up for Phone Call        Per Florentina Addison, this form just needs to be faxed.  Forms faxed back to [employer] and placed in CY's scan folder to be scanned into EMR. Gweneth Dimitri RN  July 14, 2009 10:05 AM

## 2010-05-12 NOTE — Progress Notes (Signed)
Summary: appointment  Phone Note Call from Patient Call back at Home Phone [phone]   Caller: Spouse/ruth Call For: [name] Summary of Call: Wheezing, sob,coughing,sprays nebulizers not working, wants to be seen today, please advise. Initial call taken by: Darletta Moll,  June 23, 2009 8:12 AM  Follow-up for Phone Call        Pt c/o wheezing, SOB, dry cough x 1 week. Pt has been using neb 4 times a day with no relief. Pt went to [facility] 1 week ago and was given a "shot" but it did not help much. Pt states ok for CY to call in some medicaton. Please advise. Carron Curie CMA  June 23, 2009 9:05 AM allergies: codeine [facility] [address]  Additional Follow-up for Phone Call Additional follow up Details #1::        Per CDY- give Pred taper 10mg  tabs #20 take 4 by mouth once daily x 2 days, take 3 by mouth once daily x 2 days, take 2 by mouth once daily x 2 days, then take 1 by mouth once daily x 2 days and stop. Pt wife aware of Rx being sent and also states that pt doesn't need abx(CDY wondering if needed). Pt is due here for appt on 4-1-11at 2pm; if needs sooner appt will call the office.Reynaldo Minium CMA  June 23, 2009 10:35 AM     New/Updated Medications: PREDNISONE 10 MG TABS (PREDNISONE) take 4 by mouth once dailyx2 days,  3 by mouth once dailyx2 days,  2 by mouth once dailyx2 days,  1 by mouth once daily x 2 days, then stop. Prescriptions: PREDNISONE 10 MG TABS (PREDNISONE) take 4 by mouth once dailyx2 days,  3 by mouth once dailyx2 days,  2 by mouth once dailyx2 days,  1 by mouth once daily x 2 days, then stop.  #20 x 0   Entered by:   Reynaldo Minium CMA   Authorized by:   Waymon Budge MD   Signed by:   Reynaldo Minium CMA on 06/23/2009   Method used:   Electronically to        [id number]* (retail)       [address]       [place], [place]  [address]       Ph: [phone] or [phone]       Fax: [phone]   RxID:    [id number]

## 2010-05-12 NOTE — Op Note (Signed)
Summary: [facility]   Imported By: Lester [place] 12/22/2009 09:12:44  _____________________________________________________________________  External Attachment:    Type:   Image     Comment:   External Document

## 2010-05-12 NOTE — Assessment & Plan Note (Signed)
Summary: np6/hx of cad/former pt of tysinger   Visit Type:  Initial Consult Primary Provider:  [facility]  CC:  CAD/CABG.  History of Present Illness: The patient presents as a new patient to our practice. He has a long history of coronary disease with distant percutaneous interventions and bypass surgery in 2008. Since that surgery he has done well. He does have asthma and has flares of this. He occasionally has chest discomfort with this and since he cannot always tell the difference between his lungs and his heart he made rarely taken nitroglycerin. However, he is quite active walking 2 miles per day. Recently with this he has had no chest pressure, neck or arm discomfort. He has no palpitations, presyncope or syncope. He has no PND or orthopnea.  Current Medications (verified): 1)  Spiriva Handihaler 18 Mcg Caps (Tiotropium Bromide Monohydrate) .Marland Kitchen.. 1 Daily 2)  Budesonide 0.25 Mg/38ml Susp (Budesonide) .Marland Kitchen.. 1 Neb Two Times A Day 3)  Albuterol Sulfate (2.5 Mg/51ml) 0.083% Nebu (Albuterol Sulfate) .Marland Kitchen.. 1 Vial Via Hhn Two Times A Day 4)  Diovan Hct 160-12.5 Mg  Tabs (Valsartan-Hydrochlorothiazide) .... Take 1 Tablet By Mouth Once A Day 5)  Amlodipine Besylate 2.5 Mg Tabs (Amlodipine Besylate) .... Take 1 Tab By Mouth At Bedtime 6)  Klor-Con M20 20 Meq Cr-Tabs (Potassium Chloride Crys Cr) .... Take 1 Tablet By Mouth Once A Day 7)  Clonidine Hcl 0.1 Mg Tabs (Clonidine Hcl) .Marland Kitchen.. 1 Tab By Mouth  Every Morning and 2 Tabs At Bedtime 8)  Amiodarone Hcl 200 Mg Tabs (Amiodarone Hcl) .... Take 1 Tablet Every Other Day 9)  Simvastatin 40 Mg Tabs (Simvastatin) .... Take 1 Tab By Mouth At Bedtime 10)  Adult Aspirin Low Strength 81 Mg  Tbdp (Aspirin) .... Take 1 By Mouth Once Daily 11)  Phillips Colon Health  Caps (Probiotic Product) .... Take 1 Capsule By Mouth Once A Day 12)  Multivitamins   Tabs (Multiple Vitamin) .... Take 1 Tablet By Mouth Once A Day 13)  Zyrtec Allergy 10 Mg Tabs  (Cetirizine Hcl) .... Take 1 Tab By Mouth At Bedtime 14)  Prilosec 20 Mg Cpdr (Omeprazole) .Marland Kitchen.. 1 Capsule Daily Before Meal 15)  Cpap 9 Cwp .... Wear At Bedtime 16)  Allergy Vaccine 1:10 Go (W-E) .... Every Friday 17)  Mucinex Dm 30-600 Mg Xr12h-Tab (Dextromethorphan-Guaifenesin) .... Take 1-2 Tablets Every 12 Hours As Needed 18)  Tessalon 200 Mg Caps (Benzonatate) .Marland Kitchen.. 1 Capsule By Mouth Every 8 Hours As Needed 19)  Albuterol Sulfate (2.5 Mg/10ml) 0.083% Nebu (Albuterol Sulfate) .Marland Kitchen.. 1 Neb in [place] Every 3 Hours As Needed 20)  Proair Hfa 108 (90 Base) Mcg/act  Aers (Albuterol Sulfate) .... 2 Puffs Every 3 Hours As Needed 21)  Nitrostat 0.4 Mg Subl (Nitroglycerin) .Marland Kitchen.. 1 Under Tongue Every 5 Minutes X3 As Needed For Chest Pain 22)  Provigil 100 Mg Tabs (Modafinil) .Marland Kitchen.. 1 Daily If Needed 23)  Xalatan 0.005 % Soln (Latanoprost) .Marland Kitchen.. 1 Drop Each Eye Once Daily 24)  Prednisone 10 Mg Tabs (Prednisone) .... Take 4 Each Day For 2 Days, Then 3 Each Day For 2 Days, Then 2 Each Day For 2 Days, Then 1 Each Day For 2 Days, Then Stop  Allergies: 1)  Codeine  Past History:  Past Medical History: Allergic Rhinitis Asthma --July 28, 2009-Spirometry shows FEV1 1.45 (70%) Coronary Heart Disease- no MI Hypertension Sleep Apnea- NPSG  AHI 22/hr 10/06/01 Alcoholism Depression Hyperlipidemia Pneumonia Complex med regimen- 07/2009. calendar   Past  Surgical History: CABG (11/08-left internal mammary artery to       the left anterior descending, saphenous vein graft to diagonal,       sequential saphenous vein graft to posterior descending and       posterior lateral, saphenous vein graft to obtuse marginal       Endoscopic vein harvest of the right leg greater saphenous vein) Hernia Surgery (12/03/2007)  Family History: Reviewed history from 10/01/2007 and no changes required. Mother- deceased age 10; CHF Father- deceased age 3; heart attack Sister- living age 42; COPD; Rheumatism, asthma,  emphysema Brother- living age 63; heart transplant  Social History: Reviewed history from 12/03/2007 and no changes required. 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   Vital Signs:  Patient profile:   77 year old male Height:      62 inches Weight:      160 pounds BMI:     29.37 Pulse rate:   66 / minute Pulse rhythm:   irregular BP sitting:   107 / 58  (left arm) Cuff size:   large  Vitals Entered By: Danielle Rankin, CMA (October 04, 2009 12:57 PM)  Physical Exam  General:  Well developed, well nourished, in no acute distress. Head:  normocephalic and atraumatic Eyes:  PERRLA/EOM intact; conjunctiva and lids normal. Mouth:  Edentulous . Oral mucosa normal. Neck:  Neck supple, no JVD. No masses, thyromegaly or abnormal cervical nodes. Chest Wall:  Well-healed sternotomy scar Lungs:  Diffuse expiratory wheezes Abdomen:  Bowel sounds positive; abdomen soft and non-tender without masses, organomegaly, or hernias noted. No hepatosplenomegaly, mildly obese Msk:  Back normal, normal gait. Muscle strength and tone normal. Extremities:  No clubbing or cyanosis. Neurologic:  Alert and oriented x 3. Skin:  Multiple ecchymoses Cervical Nodes:  no significant adenopathy Inguinal Nodes:  no significant adenopathy Psych:  Normal affect.   Detailed Cardiovascular Exam  Neck    Carotids: Carotids full and equal bilaterally soft left carotid bruit    Neck Veins: Normal, no JVD.    Heart    Inspection: no deformities or lifts noted.      Palpation: normal PMI with no thrills palpable.      Auscultation: regular rate and rhythm, S1, S2 without murmurs, rubs, gallops, or clicks.    Vascular    Abdominal Aorta: no palpable masses, pulsations, or audible bruits.      Femoral Pulses: normal  femoral pulses bilaterally.      Pedal Pulses: normal pedal pulses bilaterally.      Radial Pulses: normal radial pulses bilaterally.      Peripheral Circulation: no clubbing, cyanosis, or edema noted with normal capillary refill.     EKG  Procedure date:  10/04/2009  Findings:      Sinus rhythm, rate 66, axis within normal limits, intervals within normal limits, RSR prime V1 and V2, no acute ST-T wave changes  Impression & Recommendations:  Problem # 1:  CORONARY HEART DISEASE (ICD-414.00)  The patient is 3 years status post CABG. It is reasonable to screen him with an exercise treadmill test. We will also continue with risk reduction.  Orders: Treadmill (Treadmill)  Problem # 2:  DYSLIPIDEMIA (ICD-272.4) With a new black box warning I will discontinue simvastatin and  start pravastatin 80 mg. We will check a lipid profile and liver enzymes in 8 weeks.  Problem # 3:  FIBRILLATION, ATRIAL (ICD-427.31)  He had atrial fibrillation at the time of bypass. He has not had symptomatic tachypalpitations since. I think it is quite reasonable to discontinue the amiodarone.  Orders: Treadmill (Treadmill)  Problem # 4:  HYPERTENSION (ICD-401.9)  Hblood pressure is controlled and he will continue on the meds as listed.  His updated medication list for this problem includes:    Diovan Hct 160-12.5 Mg Tabs (Valsartan-hydrochlorothiazide) .Marland Kitchen... Take 1 tablet by mouth once a day    Amlodipine Besylate 2.5 Mg Tabs (Amlodipine besylate) .Marland Kitchen... Take 1 tab by mouth at bedtime    Clonidine Hcl 0.1 Mg Tabs (Clonidine hcl) .Marland Kitchen... 1 tab by mouth  every morning and 2 tabs at bedtime    Adult Aspirin Low Strength 81 Mg Tbdp (Aspirin) .Marland Kitchen... Take 1 by mouth once daily  Patient Instructions: 1)  Your physician has recommended you make the following change in your medication:  2)  Stop Amiodarone 3)  Stop multiple vitamin 4)  Stop Simvastatin 5)  Start Pravastatin 80mg  daily 6)  Your physician  recommends that you return for a FASTING lipid profile:/liver profile in 8 weeks---414.01  7)  Your physician has requested that you have an exercise tolerance test.  For further information please visit [email or web address]  Please also follow instruction sheet, as given. Prescriptions: PRAVASTATIN SODIUM 80 MG TABS (PRAVASTATIN SODIUM) one daily in the evening  #30 x 3   Entered by:   Katina Dung, RN, BSN   Authorized by:   Rollene Rotunda, MD, [facility]   Signed by:   Katina Dung, RN, BSN on 10/04/2009   Method used:   Electronically to        [facility]. # [id number]* (retail)       [address]       [place], [place]  [address]       Ph: [phone] or [phone]       Fax: [phone]   RxID:   [id number]  I have reviewed and approved all prescriptions at the time of this visit. Rollene Rotunda, MD, [facility]  October 04, 2009 1:48 PM

## 2010-05-12 NOTE — Assessment & Plan Note (Signed)
Summary: NP follow up - asthma   Primary Provider/Referring Provider:  Charolette Child, MD  CC:  6 week follow up - asthma.  states breathing is doing well and no complaints.  states has been recently diagnosed with glaucoma and has had had some skin cancers removed since last OV.  History of Present Illness:  March 11, 2009- Asthma, allergic rhinitis, cough, OSA..............................Marland Kitchenwife here He is feeling pretty well today. Wheezed some this morning and last night. He regards this as his baseline. IgE of 19 was not in range to consider Xolair as discussed.  July 09, 2009- Asthma, allergic rhinitis, cough, OSA.........................Marland Kitchenwife here Struggling with increased asthma, dyspnea, chest tightness since February. Much cough interfering with sleep. Urgent Care gave depo w/out help. Denies productive cough, fever, chest pain. Steady wheeze. We had called in prednisone which helped some. Finished last prednisone over a week ago. He dropped off Spiriva but remembers it helping They ask refill on provigil to help with safe driving  July 19, 2009 --Presents for an acute office visit. Complains of asthma flare with wheezing, dyspnea, cough occ producing clear mucus x 2 months. He is here with wife today. They are very confused with meds. He has stopped advair, only using spiriva as needed-not on regular basis. He is not taking any PPI which MAR indicates he is suppose to be on Dexilant. He c/o of persistent wheezing, cough for last 2 months. Has not been able to exercise as he usually does for several weeks. He is very upset that he is not getting better, has been seen at [facility] as well. Received deop medrol shot last visit. with some helps. Xopenex nebs his wheezing.    July 28, 2009--Presents for follow up and med review. Last visit tx w/ rec to take spiriva daily, added budesonide/xopenx neb and tx possible reflux and rhinitis. along w/ steroid taper. He feels  so much better,  back to his normal. Wife requested nebs be sent to [facility] , and they are too expensive we will send to [facility] (he has CPAP thru them), but will have to change xopenex to albuterol d/t coverage. we reviewed all his meds and organized them in a med calendar. He has been recommended to stop fish oil for now d/t potential of upper airway irritation.    September 09, 2009--Presents for 6 week follow up - asthma.  states breathing is doing well, no complaints.  states has been recently diagnosed with mild  glaucoma and has had had some skin cancers removed since last OV. He feels the best he has in a long time. His Baseline- mows grass-push mower-1 acre lot., gardens, flowers. Is considering holding spiriva to see if he really needs this one. He is on numerous meds.  Denies chest pain, dyspnea, orthopnea, hemoptysis, fever, n/v/d, edema, headache,recent travel   Preventive Screening-Counseling & Management  Alcohol-Tobacco     Smoking Status: quit  Medications Prior to Update: 1)  Spiriva Handihaler 18 Mcg Caps (Tiotropium Bromide Monohydrate) .Marland Kitchen.. 1 Daily 2)  Budesonide 0.25 Mg/33ml Susp (Budesonide) .Marland Kitchen.. 1 Neb Two Times A Day 3)  Albuterol Sulfate (2.5 Mg/33ml) 0.083% Nebu (Albuterol Sulfate) .Marland Kitchen.. 1 Vial Via Hhn Two Times A Day 4)  Diovan Hct 160-12.5 Mg  Tabs (Valsartan-Hydrochlorothiazide) .... Take 1 Tablet By Mouth Once A Day 5)  Amlodipine Besylate 2.5 Mg Tabs (Amlodipine Besylate) .... Take 1 Tab By Mouth At Bedtime 6)  Klor-Con M20 20 Meq Cr-Tabs (Potassium Chloride Crys Cr) .Marland KitchenMarland KitchenMarland Kitchen  Take 1 Tablet By Mouth Once A Day 7)  Clonidine Hcl 0.1 Mg Tabs (Clonidine Hcl) .Marland Kitchen.. 1 Tab By Mouth  Every Morning and 2 Tabs At Bedtime 8)  Amiodarone Hcl 200 Mg Tabs (Amiodarone Hcl) .... Take 1 Tablet Every Other Day 9)  Simvastatin 40 Mg Tabs (Simvastatin) .... Take 1 Tab By Mouth At Bedtime 10)  Adult Aspirin Low Strength 81 Mg  Tbdp (Aspirin) .... Take 1 By Mouth Once Daily 11)  Phillips Colon Health  Caps (Probiotic  Product) .... Take 1 Capsule By Mouth Once A Day 12)  Multivitamins   Tabs (Multiple Vitamin) .... Take 1 Tablet By Mouth Once A Day 13)  Zyrtec Allergy 10 Mg Tabs (Cetirizine Hcl) .... Take 1 Tab By Mouth At Bedtime 14)  Prilosec 20 Mg Cpdr (Omeprazole) .Marland Kitchen.. 1 Capsule Daily Before Meal 15)  Cpap 9 Cwp .... Wear At Bedtime 16)  Allergy Vaccine 1:10 Go (W-E) .... Every Friday 17)  Mucinex Dm 30-600 Mg Xr12h-Tab (Dextromethorphan-Guaifenesin) .... Take 1-2 Tablets Every 12 Hours As Needed 18)  Tessalon 200 Mg Caps (Benzonatate) .Marland Kitchen.. 1 Capsule By Mouth Every 8 Hours As Needed 19)  Albuterol Sulfate (2.5 Mg/14ml) 0.083% Nebu (Albuterol Sulfate) .Marland Kitchen.. 1 Neb in [place] Every 3 Hours As Needed 20)  Proair Hfa 108 (90 Base) Mcg/act  Aers (Albuterol Sulfate) .... 2 Puffs Every 3 Hours As Needed 21)  Nitrostat 0.4 Mg Subl (Nitroglycerin) .Marland Kitchen.. 1 Under Tongue Every 5 Minutes X3 As Needed For Chest Pain 22)  Provigil 100 Mg Tabs (Modafinil) .Marland Kitchen.. 1 Daily If Needed  Current Medications (verified): 1)  Spiriva Handihaler 18 Mcg Caps (Tiotropium Bromide Monohydrate) .Marland Kitchen.. 1 Daily 2)  Budesonide 0.25 Mg/62ml Susp (Budesonide) .Marland Kitchen.. 1 Neb Two Times A Day 3)  Albuterol Sulfate (2.5 Mg/42ml) 0.083% Nebu (Albuterol Sulfate) .Marland Kitchen.. 1 Vial Via Hhn Two Times A Day 4)  Diovan Hct 160-12.5 Mg  Tabs (Valsartan-Hydrochlorothiazide) .... Take 1 Tablet By Mouth Once A Day 5)  Amlodipine Besylate 2.5 Mg Tabs (Amlodipine Besylate) .... Take 1 Tab By Mouth At Bedtime 6)  Klor-Con M20 20 Meq Cr-Tabs (Potassium Chloride Crys Cr) .... Take 1 Tablet By Mouth Once A Day 7)  Clonidine Hcl 0.1 Mg Tabs (Clonidine Hcl) .Marland Kitchen.. 1 Tab By Mouth  Every Morning and 2 Tabs At Bedtime 8)  Amiodarone Hcl 200 Mg Tabs (Amiodarone Hcl) .... Take 1 Tablet Every Other Day 9)  Simvastatin 40 Mg Tabs (Simvastatin) .... Take 1 Tab By Mouth At Bedtime 10)  Adult Aspirin Low Strength 81 Mg  Tbdp (Aspirin) .... Take 1 By Mouth Once Daily 11)  Phillips Colon  Health  Caps (Probiotic Product) .... Take 1 Capsule By Mouth Once A Day 12)  Multivitamins   Tabs (Multiple Vitamin) .... Take 1 Tablet By Mouth Once A Day 13)  Zyrtec Allergy 10 Mg Tabs (Cetirizine Hcl) .... Take 1 Tab By Mouth At Bedtime 14)  Prilosec 20 Mg Cpdr (Omeprazole) .Marland Kitchen.. 1 Capsule Daily Before Meal 15)  Cpap 9 Cwp .... Wear At Bedtime 16)  Allergy Vaccine 1:10 Go (W-E) .... Every Friday 17)  Mucinex Dm 30-600 Mg Xr12h-Tab (Dextromethorphan-Guaifenesin) .... Take 1-2 Tablets Every 12 Hours As Needed 18)  Tessalon 200 Mg Caps (Benzonatate) .Marland Kitchen.. 1 Capsule By Mouth Every 8 Hours As Needed 19)  Albuterol Sulfate (2.5 Mg/68ml) 0.083% Nebu (Albuterol Sulfate) .Marland Kitchen.. 1 Neb in [place] Every 3 Hours As Needed 20)  Proair Hfa 108 (90 Base) Mcg/act  Aers (Albuterol Sulfate) .Marland KitchenMarland KitchenMarland Kitchen  2 Puffs Every 3 Hours As Needed 21)  Nitrostat 0.4 Mg Subl (Nitroglycerin) .Marland Kitchen.. 1 Under Tongue Every 5 Minutes X3 As Needed For Chest Pain 22)  Provigil 100 Mg Tabs (Modafinil) .Marland Kitchen.. 1 Daily If Needed 23)  Xalatan 0.005 % Soln (Latanoprost) .Marland Kitchen.. 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 calendar   Review of Systems      See HPI  Vital Signs:  Patient profile:   77 year old male Height:      62 inches Weight:      166.31 pounds BMI:     30.53 O2 Sat:      97 % on Room air Temp:     97.0 degrees F oral Pulse rate:   70 / minute BP sitting:   108 / 66  (left arm) Cuff size:   regular  Vitals Entered By: [name] CNA/MA (September 09, 2009 9:39 AM)  O2 Flow:  Room air CC: 6 week follow up - asthma.  states breathing is doing well, no complaints.  states has been recently diagnosed with glaucoma and has had had some skin cancers removed since last OV Is Patient Diabetic? No Comments Medications reviewed with patient Daytime contact number verified with patient. [name] CNA/MA  September 09, 2009 9:39 AM    Physical Exam  Additional Exam:  General: A/Ox3; pleasant and cooperative, NAD, hard of hearing, obese SKIN: no rash, lesions NODES: no lymphadenopathy HEENT: [place]/AT, EOM- WNL, Conjuctivae- clear, PERRLA, TM-bilateral hearing aids, Nose- clear, Throat- clear and wnl NECK: Supple w/ fair ROM, JVD- none, normal carotid impulses w/o bruits Thyroid- nonpalplable  CHEST: CTA no wheezing HEART: RRR, no m/g/r heard ABDOMEN: Soft and nl; WUX:LKGM, nl pulses, no edema  NEURO: Grossly intact to observation      Impression & Recommendations:  Problem # 1:  ASTHMA (ICD-493.90) Well compensated on present regimen.  doing very well   Medications Added to Medication List This Visit: 1)  Xalatan 0.005 % Soln (Latanoprost) .Marland Kitchen.. 1 drop each eye once daily  Complete Medication List: 1)  Spiriva Handihaler 18 Mcg Caps (Tiotropium bromide monohydrate) .Marland Kitchen.. 1 daily 2)  Budesonide 0.25 Mg/41ml Susp (Budesonide) .Marland Kitchen.. 1 neb two times a day 3)  Albuterol Sulfate (2.5 Mg/38ml) 0.083% Nebu (Albuterol sulfate) .Marland Kitchen.. 1 vial via hhn two times a day 4)  Diovan Hct 160-12.5 Mg Tabs  (Valsartan-hydrochlorothiazide) .... Take 1 tablet by mouth once a day 5)  Amlodipine Besylate 2.5 Mg Tabs (Amlodipine besylate) .... Take 1 tab by mouth at bedtime 6)  Klor-con M20 20 Meq Cr-tabs (Potassium chloride crys cr) .... Take 1 tablet by mouth once a day 7)  Clonidine Hcl 0.1 Mg  Tabs (Clonidine hcl) .Marland Kitchen.. 1 tab by mouth  every morning and 2 tabs at bedtime 8)  Amiodarone Hcl 200 Mg Tabs (Amiodarone hcl) .... Take 1 tablet every other day 9)  Simvastatin 40 Mg Tabs (Simvastatin) .... Take 1 tab by mouth at bedtime 10)  Adult Aspirin Low Strength 81 Mg Tbdp (Aspirin) .... Take 1 by mouth once daily 11)  Phillips Colon Health Caps (Probiotic product) .... Take 1 capsule by mouth once a day 12)  Multivitamins Tabs (Multiple vitamin) .... Take 1 tablet by mouth once a day 13)  Zyrtec Allergy 10 Mg Tabs (Cetirizine hcl) .... Take 1 tab by mouth at bedtime 14)  Prilosec 20 Mg Cpdr (Omeprazole) .Marland Kitchen.. 1 capsule daily before meal 15)  Cpap 9 Cwp  .... Wear at bedtime 16)  Allergy Vaccine 1:10 Go (w-e)  .... Every friday 17)  Mucinex Dm 30-600 Mg Xr12h-tab (Dextromethorphan-guaifenesin) .... Take 1-2 tablets every 12 hours as needed 18)  Tessalon 200 Mg Caps (Benzonatate) .Marland Kitchen.. 1 capsule by mouth every 8 hours as needed 19)  Albuterol Sulfate (2.5 Mg/53ml) 0.083% Nebu (Albuterol sulfate) .Marland Kitchen.. 1 neb in vial every 3 hours as needed 20)  Proair Hfa 108 (90 Base) Mcg/act Aers (Albuterol sulfate) .... 2 puffs every 3 hours as needed 21)  Nitrostat 0.4 Mg Subl (Nitroglycerin) .Marland Kitchen.. 1 under tongue every 5 minutes x3 as needed for chest pain 22)  Provigil 100 Mg Tabs (Modafinil) .Marland Kitchen.. 1 daily if needed 23)  Xalatan 0.005 % Soln (Latanoprost) .Marland Kitchen.. 1 drop each eye once daily  Other Orders: Est. Patient Level II (98119)  Patient Instructions: 1)  Follow med calendar closely and bring to each visit.  2)  follow up 6-8  weeks Dr. Maple Hudson as scheduled and as needed  3)  You can try off of Spiriva, if  breathing worsens -restart daily.     4)    Prescriptions: SPIRIVA HANDIHALER 18 MCG CAPS (TIOTROPIUM BROMIDE MONOHYDRATE) 1 daily  #30 x prn   Entered and Authorized by:   Rubye Oaks NP   Signed by:   [name] NP on 09/09/2009   Method used:   Electronically to        [facility]. # [id number]* (retail)       [address]       [place], [place]  [address]       Ph: [phone] or [phone]       Fax: [phone]   RxID:   [id number]

## 2010-05-12 NOTE — Progress Notes (Signed)
Summary: pt out town need refill  Phone Note From Pharmacy   Caller: [facility]/ [phone] fax [phone] Summary of Call: Pt out of town need refill of Diovan HCT 160/12.5 mg 1 bid Initial call taken by: Judie Grieve,  January 18, 2010 2:18 PM  Follow-up for Phone Call        No answer at number listed.    New/Updated Medications: DIOVAN HCT 160-12.5 MG  TABS (VALSARTAN-HYDROCHLOROTHIAZIDE) Take 1 tablet by mouth once a day Prescriptions: DIOVAN HCT 160-12.5 MG  TABS (VALSARTAN-HYDROCHLOROTHIAZIDE) Take 1 tablet by mouth once a day  #30 x 6   Entered by:   Marrion Coy, CNA   Authorized by:   Rollene Rotunda, MD, [facility]   Signed by:   Marrion Coy, CNA on 01/18/2010   Method used:   [facility] [id number]       [address]       [place], [place]  [address]       Ph: [phone]       Fax: [phone]   RxID:   [id number]

## 2010-05-12 NOTE — Assessment & Plan Note (Signed)
Summary: wheezing///JJ   Primary Provider/Referring Provider:  Charolette Child, MD  CC:  Accute visit-wheezing; SOB; productive cough at times-brown in color. Started Prednisone yesterday.Marland Kitchen  History of Present Illness: July 28, 2009--Presents for follow up and med review. Last visit tx w/ rec to take spiriva daily, added budesonide/xopenx neb and tx possible reflux and rhinitis. along w/ steroid taper. He feels  so much better, back to his normal. Wife requested nebs be sent to [facility] , and they are too expensive we will send to [facility] (he has CPAP thru them), but will have to change xopenex to albuterol d/t coverage. we reviewed all his meds and organized them in a med calendar. He has been recommended to stop fish oil for now d/t potential of upper airway irritation.    September 09, 2009--Presents for 6 week follow up - asthma.  states breathing is doing well, no complaints.  states has been recently diagnosed with mild  glaucoma and has had had some skin cancers removed since last OV. He feels the best he has in a long time. His Baseline- mows grass-push mower-1 acre lot., gardens, flowers. Is considering holding spiriva to see if he really needs this one. He is on numerous meds.  Denies chest pain, dyspnea, orthopnea, hemoptysis, fever, n/v/d, edema, headache,recent travel   September 30, 2009-  Asthma, allergic rhinitis, OSA, ................................Marland Kitchenwife here He was treated by NP over last 2 months and is feeling much better. She put him on otc prilosec and that also seems to have helped cough. He was much better until a week ago when he began needing to use his rescue inhaler again. More wheeze and exertional dypnea. Denies fever, sore throat, purulent. This week using both nebulizer and rescue inhaler. doesn't feel reflux. He started prednisone yesterday- taper. We discussed it and will extend it if needed.    Asthma History    Initial Asthma Severity Rating:    Age range: 12+ years  Symptoms: daily    Nighttime Awakenings: 3-4/month    Interferes w/ normal activity: minor limitations    SABA use (not for EIB): several times per day    Asthma Severity Assessment: Severe Persistent   Preventive Screening-Counseling & Management  Alcohol-Tobacco     Smoking Status: quit     Year Quit: 1981     Passive Smoke Exposure: yes     Tobacco Counseling: to remain off tobacco products  Current Medications (verified): 1)  Spiriva Handihaler 18 Mcg Caps (Tiotropium Bromide Monohydrate) .Marland Kitchen.. 1 Daily 2)  Budesonide 0.25 Mg/37ml Susp (Budesonide) .Marland Kitchen.. 1 Neb Two Times A Day 3)  Albuterol Sulfate (2.5 Mg/28ml) 0.083% Nebu (Albuterol Sulfate) .Marland Kitchen.. 1 Vial Via Hhn Two Times A Day 4)  Diovan Hct 160-12.5 Mg  Tabs (Valsartan-Hydrochlorothiazide) .... Take 1 Tablet By Mouth Once A Day 5)  Amlodipine Besylate 2.5 Mg Tabs (Amlodipine Besylate) .... Take 1 Tab By Mouth At Bedtime 6)  Klor-Con M20 20 Meq Cr-Tabs (Potassium Chloride Crys Cr) .... Take 1 Tablet By Mouth Once A Day 7)  Clonidine Hcl 0.1 Mg Tabs (Clonidine Hcl) .Marland Kitchen.. 1 Tab By Mouth  Every Morning and 2 Tabs At Bedtime 8)  Amiodarone Hcl 200 Mg Tabs (Amiodarone Hcl) .... Take 1 Tablet Every Other Day 9)  Simvastatin 40 Mg Tabs (Simvastatin) .... Take 1 Tab By Mouth At Bedtime 10)  Adult Aspirin Low Strength 81 Mg  Tbdp (Aspirin) .... Take 1 By Mouth Once Daily 11)  Phillips Colon Health  Caps (Probiotic  Product) .... Take 1 Capsule By Mouth Once A Day 12)  Multivitamins   Tabs (Multiple Vitamin) .... Take 1 Tablet By Mouth Once A Day 13)  Zyrtec Allergy 10 Mg Tabs (Cetirizine Hcl) .... Take 1 Tab By Mouth At Bedtime 14)  Prilosec 20 Mg Cpdr (Omeprazole) .Marland Kitchen.. 1 Capsule Daily Before Meal 15)  Cpap 9 Cwp .... Wear At Bedtime 16)  Allergy Vaccine 1:10 Go (W-E) .... Every Friday 17)  Mucinex Dm 30-600 Mg Xr12h-Tab (Dextromethorphan-Guaifenesin) .... Take 1-2 Tablets Every 12 Hours As Needed 18)  Tessalon 200 Mg Caps  (Benzonatate) .Marland Kitchen.. 1 Capsule By Mouth Every 8 Hours As Needed 19)  Albuterol Sulfate (2.5 Mg/78ml) 0.083% Nebu (Albuterol Sulfate) .Marland Kitchen.. 1 Neb in [place] Every 3 Hours As Needed 20)  Proair Hfa 108 (90 Base) Mcg/act  Aers (Albuterol Sulfate) .... 2 Puffs Every 3 Hours As Needed 21)  Nitrostat 0.4 Mg Subl (Nitroglycerin) .Marland Kitchen.. 1 Under Tongue Every 5 Minutes X3 As Needed For Chest Pain 22)  Provigil 100 Mg Tabs (Modafinil) .Marland Kitchen.. 1 Daily If Needed 23)  Xalatan 0.005 % Soln (Latanoprost) .Marland Kitchen.. 1 Drop Each Eye Once Daily 24)  Prednisone 10 Mg Tabs (Prednisone) .... Take 4 Each Day For 2 Days, Then 3 Each Day For 2 Days, Then 2 Each Day For 2 Days, Then 1 Each Day For 2 Days, Then Stop  Allergies (verified): 1)  Codeine  Past History:  Past Medical History: Last updated: 09/09/2009 Allergic Rhinitis Asthma --July 28, 2009-Spirometry shows FEV1 1.45 (70%) Coronary Heart Disease- no MI Hypertension Sleep Apnea- NPSG  AHI 22/hr 10/06/01 Alcoholism Depression Hyperlipidemia Pneumonia complex med regimen- 07/2009. 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  The  patient denies productive cough, coughing up blood, chest pain, irregular heartbeats, acid heartburn, indigestion, loss of appetite, weight change, abdominal pain, difficulty swallowing, sore throat, tooth/dental problems, headaches, nasal congestion/difficulty breathing through nose, and sneezing.    Vital Signs:  Patient profile:   77 year old male Height:      62 inches Weight:      163 pounds BMI:     29.92 O2 Sat:      98 % on Room air Pulse rate:   84 / minute BP sitting:   140 / 68  (left arm) Cuff size:   regular  Vitals Entered By: Reynaldo Minium CMA (October 04, 2009 2:06 PM)  O2 Flow:  Room air CC: Accute visit-wheezing; SOB; productive cough at times-brown in color. Started Prednisone yesterday.   Physical Exam  Additional Exam:  Additional Exam:  General: A/Ox3; pleasant and cooperative, NAD, hard of hearing,  SKIN: no rash, lesions, old tatoo NODES: no lymphadenopathy HEENT: [place]/AT, EOM- WNL,  Conjuctivae- clear, PERRLA, TM-bilateral hearing aids, Nose- clear, Throat- clear and wnl NECK: Supple w/ fair ROM, JVD- none, normal carotid impulses w/o bruits Thyroid-  CHEST: trace end-expiratory wheeze. HEART: RRR, no m/g/r heard ABDOMEN: Soft and nl; UUV:OZDG, nl pulses, no edema  NEURO: Grossly intact to observation     Impression & Recommendations:  Problem # 1:  ASTHMA (ICD-493.90) Acute exacerbation- nonspecific. We will have him finish his current pred taper. Give a refill for standby if needed. don't see indication for antibiotic now. We discussed distinction between asthma and CHF, available meds, role of steroids.  Problem # 2:  SLEEP APNEA (ICD-780.57) Continues good compliance and control on cpap at 9. No change would make him better at this time.  Other Orders: Est. Patient Level III (64403) Primary Care Referral (Primary) Cardiology Referral (Cardiology)  Patient Instructions: 1)  Please schedule a follow-up appointment in 4 months. 2)  See [facility] to see  about referrals to establish with Primary Care and Cardiology. 3)  Finish the prednisone taper. A refill is available  Prescriptions: PREDNISONE 10 MG TABS (PREDNISONE) take 4 each day for 2 days, then 3 each day for 2 days, then 2 each day for 2 days, then 1 each day for 2 days, then stop  #20 x 1   Entered and Authorized by:   Waymon Budge MD   Signed by:   Waymon Budge MD on 09/30/2009   Method used:   Historical   RxID:   [id number]

## 2010-05-20 ENCOUNTER — Ambulatory Visit (INDEPENDENT_AMBULATORY_CARE_PROVIDER_SITE_OTHER): Payer: MEDICARE | Admitting: Internal Medicine

## 2010-05-20 ENCOUNTER — Encounter: Payer: Self-pay | Admitting: Internal Medicine

## 2010-05-20 DIAGNOSIS — E785 Hyperlipidemia, unspecified: Secondary | ICD-10-CM

## 2010-05-20 DIAGNOSIS — G4730 Sleep apnea, unspecified: Secondary | ICD-10-CM

## 2010-05-20 DIAGNOSIS — J309 Allergic rhinitis, unspecified: Secondary | ICD-10-CM

## 2010-05-20 DIAGNOSIS — I2510 Atherosclerotic heart disease of native coronary artery without angina pectoris: Secondary | ICD-10-CM

## 2010-05-20 DIAGNOSIS — G4710 Hypersomnia, unspecified: Secondary | ICD-10-CM

## 2010-05-20 DIAGNOSIS — J45909 Unspecified asthma, uncomplicated: Secondary | ICD-10-CM

## 2010-05-20 DIAGNOSIS — I10 Essential (primary) hypertension: Secondary | ICD-10-CM

## 2010-05-26 NOTE — Assessment & Plan Note (Signed)
Summary: 6 MTH # CD   Vital Signs:  Patient profile:   77 year old male Height:      62 inches (157.48 cm) Weight:      159.4 pounds (72.45 kg) O2 Sat:      97 % on Room air Temp:     98.6 degrees F (37.00 degrees C) oral Pulse rate:   65 / minute BP sitting:   138 / 78  (left arm) Cuff size:   regular  Vitals Entered By: Orlan Leavens RMA (May 20, 2010 1:30 PM)  O2 Flow:  Room air CC: 6 month follow-up Is Patient Diabetic? No Pain Assessment Patient in pain? no        Primary Care Provider:  [facility]  CC:  6 month follow-up.  History of Present Illness: here for f/u here with wife who provdes much of the hx  1) CAD s/p CABG 2008 - follows with cards for same - s/p treadmill stress test in 11/2009 - -reports compliance with ongoing medical treatment and no changes in medication dose or frequency. denies adverse side effects related to current therapy. no CP or anginal symptoms   2) HTN - reports compliance with ongoing medical treatment and no changes in medication dose or frequency. denies adverse side effects related to current therapy. no edema or HA or weakness  3) dyslipidemia - changed from simva to prava 6/2011due to FDA warnings - reports compliance with ongoing medical treatment and no other changes in medication dose or frequency. denies adverse side effects related to current therapy. no muscle pain or weakness  4) asthma, remote tobacco - follows with pulm for same - frequent flares related to weather change - reports compliance with ongoing nubulizer treatment and no changes in medication dose or frequency. denies adverse side effects related to current therapy.   Clinical Review Panels:  Immunizations   Last Flu Vaccine:  Fluvax 3+ (12/30/2009)   Last Pneumovax:  Historical (02/20/2001)  Lipid Management   Cholesterol:  109 (11/29/2009)   LDL (bad choesterol):  61 (11/29/2009)   HDL (good cholesterol):  32.90 (11/29/2009)  Triglycerides:  53 (07/12/2009)  CBC   WBC:  8.8 (10/22/2007)   RBC:  4.29 (10/22/2007)   Hgb:  13.3 (10/22/2007)   Hct:  38.5 (10/22/2007)   Platelets:  356 (10/22/2007)   MCV  89.6 (10/22/2007)   MCHC  34.5 (10/22/2007)   RDW  14.9 (10/22/2007)   PMN:  84.7 (10/22/2007)   Lymphs:  7.0 (10/22/2007)   Monos:  5.6 (10/22/2007)   Eosinophils:  2.2 (10/22/2007)   Basophil:  0.5 (10/22/2007)  Complete Metabolic Panel   Glucose:  110 (12/03/2008)   Sodium:  139 (12/03/2008)   Potassium:  3.8 (12/03/2008)   Chloride:  98 (12/03/2008)   CO2:  24 (12/03/2008)   BUN:  13 (12/03/2008)   Creatinine:  0.92 (12/03/2008)   Albumin:  3.9 (11/29/2009)   Total Protein:  6.2 (11/29/2009)   Calcium:  8.8 (12/03/2008)   Total Bili:  0.6 (11/29/2009)   Alk Phos:  57 (11/29/2009)   SGPT (ALT):  23 (11/29/2009)   SGOT (AST):  22 (11/29/2009)   Current Medications (verified): 1)  Spiriva Handihaler 18 Mcg Caps (Tiotropium Bromide Monohydrate) .Marland Kitchen.. 1 Daily 2)  Budesonide 0.25 Mg/67ml Susp (Budesonide) .Marland Kitchen.. 1 Neb Two Times A Day 3)  Albuterol Sulfate (2.5 Mg/17ml) 0.083% Nebu (Albuterol Sulfate) .Marland Kitchen.. 1 Vial Via Hhn Two Times A Day 4)  Diovan Hct 160-12.5 Mg  Tabs (  Valsartan-Hydrochlorothiazide) .Marland Kitchen.. 1 By Mouth Two Times A Day 5)  Klor-Con M20 20 Meq Cr-Tabs (Potassium Chloride Crys Cr) .... Take 1 Tablet By Mouth Once A Day 6)  Clonidine Hcl 0.1 Mg Tabs (Clonidine Hcl) .Marland Kitchen.. 1 Tab By Mouth  Every Morning and 2 Tabs At Bedtime 7)  Pravastatin Sodium 80 Mg Tabs (Pravastatin Sodium) .... One Daily in The Evening 8)  Adult Aspirin Low Strength 81 Mg  Tbdp (Aspirin) .... Take 1 By Mouth Once Daily 9)  Phillips Colon Health  Caps (Probiotic Product) .... Take 1 Capsule By Mouth Once A Day 10)  Zyrtec Allergy 10 Mg Tabs (Cetirizine Hcl) .... Take 1 Tab By Mouth At Bedtime 11)  Prilosec 20 Mg Cpdr (Omeprazole) .Marland Kitchen.. 1 Capsule Daily Before Meal 12)  Cpap 10 Cwp .... Wear At Bedtime 13)  Allergy  Vaccine 1:10 Go (W-E) .... Every Friday 14)  Mucinex Dm 30-600 Mg Xr12h-Tab (Dextromethorphan-Guaifenesin) .... Take 1-2 Tablets Every 12 Hours As Needed 15)  Albuterol Sulfate (2.5 Mg/14ml) 0.083% Nebu (Albuterol Sulfate) .Marland Kitchen.. 1 Neb in [place] Every 3 Hours As Needed 16)  Proair Hfa 108 (90 Base) Mcg/act  Aers (Albuterol Sulfate) .... 2 Puffs Every 3 Hours As Needed 17)  Nitrostat 0.4 Mg Subl (Nitroglycerin) .Marland Kitchen.. 1 Under Tongue Every 5 Minutes X3 As Needed For Chest Pain 18)  Provigil 100 Mg Tabs (Modafinil) .Marland Kitchen.. 1 Daily If Needed 19)  Xalatan 0.005 % Soln (Latanoprost) .Marland Kitchen.. 1 Drop Each Eye Once Daily 20)  Amlodipine Besylate 5 Mg Tabs (Amlodipine Besylate) .... Take 1 By Mouth At Bedtime  Allergies (verified): 1)  Codeine  Past History:  Past Medical History: Allergic Rhinitis Asthma --July 28, 2009-Spirometry shows FEV1 1.45 (70%) Coronary Heart Disease- no MI Hypertension Sleep Apnea- NPSG  AHI 22/hr 10/06/01 Alcoholism hx Depression Hyperlipidemia GERD  MD roster: pulm - young cards - hochrein (prev tysinger) GI - perry derm - gould uro - kimbrough  Review of Systems  The patient denies fever, weight loss, chest pain, headaches, and abdominal pain.    Physical Exam  General:  Well developed, well nourished, in no acute distress. wife at side Lungs:  normal respiratory effort, no intercostal retractions or use of accessory muscles; normal breath sounds bilaterally - no crackles and no wheezes.    Heart:  normal rate, regular rhythm, no murmur, and no rub. BLE without edema. Psych:  Oriented X3, memory intact for recent and remote, normally interactive, good eye contact, not anxious appearing, not depressed appearing, and not agitated.      Impression & Recommendations:  Problem # 1:  HYPERTENSION (ICD-401.9)  reports higher bp trend - wil inc amlodipine  His updated medication list for this problem includes:    Diovan Hct 160-12.5 Mg Tabs  (Valsartan-hydrochlorothiazide) .Marland Kitchen... 1 by mouth two times a day    Clonidine Hcl 0.1 Mg Tabs (Clonidine hcl) .Marland Kitchen... 1 tab by mouth  every morning and 2 tabs at bedtime    Amlodipine Besylate 5 Mg Tabs (Amlodipine besylate) .Marland Kitchen... Take 1 by mouth two times a day  BP today: 138/78 Prior BP: 124/68 (03/25/2010)  Prior 10 Yr Risk Heart Disease: N/A (11/29/2009)  Labs Reviewed: K+: 3.8 (12/03/2008) Creat: : 0.92 (12/03/2008)   Chol: 109 (11/29/2009)   HDL: 32.90 (11/29/2009)   LDL: 61 (11/29/2009)   TG: 77.0 (11/29/2009)  Orders: Prescription Created Electronically [phone])  Problem # 2:  DYSLIPIDEMIA (ICD-272.4)  His updated medication list for this problem includes:    Pravastatin Sodium  80 Mg Tabs (Pravastatin sodium) ..... One daily in the evening  Labs Reviewed: SGOT: 22 (11/29/2009)   SGPT: 23 (11/29/2009)  Prior 10 Yr Risk Heart Disease: N/A (11/29/2009)   HDL:32.90 (11/29/2009), 63 (07/12/2009)  LDL:61 (11/29/2009), 62 (07/12/2009)  Chol:109 (11/29/2009), 136 (07/12/2009)  Trig:77.0 (11/29/2009), 53 (07/12/2009)  Problem # 3:  CORONARY HEART DISEASE (ICD-414.00)  His updated medication list for this problem includes:    Diovan Hct 160-12.5 Mg Tabs (Valsartan-hydrochlorothiazide) .Marland Kitchen... 1 by mouth two times a day    Clonidine Hcl 0.1 Mg Tabs (Clonidine hcl) .Marland Kitchen... 1 tab by mouth  every morning and 2 tabs at bedtime    Adult Aspirin Low Strength 81 Mg Tbdp (Aspirin) .Marland Kitchen... Take 1 by mouth once daily    Nitrostat 0.4 Mg Subl (Nitroglycerin) .Marland Kitchen... 1 under tongue every 5 minutes x3 as needed for chest pain    Amlodipine Besylate 5 Mg Tabs (Amlodipine besylate) .Marland Kitchen... Take 1 by mouth two times a day  status post CABG 2008. s/p exercise treadmill test 11/2009. We will also continue with risk reduction.  ETT Interpretation: normal-no evidence of ischemia by ST analysis (11/29/2009)  ETT Comments: Reasonable exercise tolerance for his age.  Appropriate BP response.  Occasional PVCs and  frequent PACs with exercise.  No chest pain.  Normal heart rate recovery.  No ischemic ST T wave changes. (11/29/2009)  Labs Reviewed: Chol: 109 (11/29/2009)   HDL: 32.90 (11/29/2009)   LDL: 61 (11/29/2009)   TG: 77.0 (11/29/2009)  Problem # 4:  ASTHMA (ICD-493.90)  His updated medication list for this problem includes:    Spiriva Handihaler 18 Mcg Caps (Tiotropium bromide monohydrate) .Marland Kitchen... 1 daily    Budesonide 0.25 Mg/77ml Susp (Budesonide) .Marland Kitchen... 1 neb two times a day    Albuterol Sulfate (2.5 Mg/63ml) 0.083% Nebu (Albuterol sulfate) .Marland Kitchen... 1 vial via hhn two times a day    Albuterol Sulfate (2.5 Mg/33ml) 0.083% Nebu (Albuterol sulfate) .Marland Kitchen... 1 neb in vial every 3 hours as needed    Proair Hfa 108 (90 Base) Mcg/act Aers (Albuterol sulfate) .Marland Kitchen... 2 puffs every 3 hours as needed  follows with pulm for same 05/20/10: COPD with heavy smoking history, chronic obstructive asthma pattern. Clinically stable. The nebulizer does helps and he maintains exercise for stamina.   Complete Medication List: 1)  Spiriva Handihaler 18 Mcg Caps (Tiotropium bromide monohydrate) .Marland Kitchen.. 1 daily 2)  Budesonide 0.25 Mg/70ml Susp (Budesonide) .Marland Kitchen.. 1 neb two times a day 3)  Albuterol Sulfate (2.5 Mg/80ml) 0.083% Nebu (Albuterol sulfate) .Marland Kitchen.. 1 vial via hhn two times a day 4)  Diovan Hct 160-12.5 Mg Tabs (Valsartan-hydrochlorothiazide) .Marland Kitchen.. 1 by mouth two times a day 5)  Klor-con M20 20 Meq Cr-tabs (Potassium chloride crys cr) .... Take 1 tablet by mouth once a day 6)  Clonidine Hcl 0.1 Mg Tabs (Clonidine hcl) .Marland Kitchen.. 1 tab by mouth  every morning and 2 tabs at bedtime 7)  Pravastatin Sodium 80 Mg Tabs (Pravastatin sodium) .... One daily in the evening 8)  Adult Aspirin Low Strength 81 Mg Tbdp (Aspirin) .... Take 1 by mouth once daily 9)  Phillips Colon Health Caps (Probiotic product) .... Take 1 capsule by mouth once a day 10)  Zyrtec Allergy 10 Mg Tabs (Cetirizine hcl) .... Take 1 tab by mouth at bedtime 11)  Prilosec 20  Mg Cpdr (Omeprazole) .Marland Kitchen.. 1 capsule daily before meal 12)  Cpap 10 Cwp  .... Wear at bedtime 13)  Allergy Vaccine 1:10 Go (w-e)  .... Every  friday 14)  Mucinex Dm 30-600 Mg Xr12h-tab (Dextromethorphan-guaifenesin) .... Take 1-2 tablets every 12 hours as needed 15)  Albuterol Sulfate (2.5 Mg/87ml) 0.083% Nebu (Albuterol sulfate) .Marland Kitchen.. 1 neb in vial every 3 hours as needed 16)  Proair Hfa 108 (90 Base) Mcg/act Aers (Albuterol sulfate) .... 2 puffs every 3 hours as needed 17)  Nitrostat 0.4 Mg Subl (Nitroglycerin) .Marland Kitchen.. 1 under tongue every 5 minutes x3 as needed for chest pain 18)  Provigil 100 Mg Tabs (Modafinil) .Marland Kitchen.. 1 daily if needed 19)  Xalatan 0.005 % Soln (Latanoprost) .Marland Kitchen.. 1 drop each eye once daily 20)  Amlodipine Besylate 5 Mg Tabs (Amlodipine besylate) .... Take 1 by mouth two times a day  Patient Instructions: 1)  it was good to see you today. 2)  medications reviewed - increase amlodipine to 5mg  two times a day - your prescription has been electronically submitted to your pharmacy. Please take as directed. Contact our office if you believe you're having problems with the medication(s).  3)  continue to be active as you are doing! 4)  Please schedule a follow-up appointment in 4-6 months for general review and blood pressure check, call sooner if problems.  Prescriptions: AMLODIPINE BESYLATE 5 MG TABS (AMLODIPINE BESYLATE) take 1 by mouth two times a day  #60 x 3   Entered and Authorized by:   Newt Lukes MD   Signed by:   Newt Lukes MD on 05/20/2010   Method used:   Electronically to        [facility]  [address] [id number]* (retail)       [address]       [place], [place]  [address]       Ph: [phone] or [phone]       Fax: [phone]   RxID:   [id number]    Orders Added: 1)  Est. Patient Level IV [10932] 2)  Prescription Created Electronically [phone]

## 2010-05-26 NOTE — Assessment & Plan Note (Signed)
Summary: 4 month/cb   Primary Provider/Referring Provider:  [facility]  CC:  4 month follow up visit-allergies..  History of Present Illness:  December 30, 2009- Asthma, allergic rhinitis, OSA, GERD.......................Stephen Kitchenwife here Fairly stable through the summer. In the last 2 days he has been wheezing and his routine use of nebulizer isn't clearing it. This may correspond to onset of rainy weather. Nose is also more congested, running and sneezing. He is frightened by potential for rapid progression of wheezing dyspnea. Wife assures me that he is compliant with his regimen.  March 25, 2010- Asthma, allergic rhinitis, OSA, GERD.......................Stephen Kitchenwife here Nurse-CC: Acute visit-pt is falling asleep while driving and eating. Acute visit- Despite Provigil and compliant use of CPAP he is falling asleep driving, eating, conversation.  Usually Provigil had prevented this, using 1/2 x 200 mg. He sleeps every day during naps. Wife says he does snore some through his CPAP.   May 20, 2010- Asthma, allergic rhinitis, OSA, GERD.......................Stephen Kitchenwife here Nurse-CC: 4 month follow up visit-allergies. CPAP 10- Doing well at this pressure- sleeps  better and less sleepy. Asks another sample Nuvigil- does help for infrequent use, but too expensive.  Allergy- Denies recent problems and he has been working outside a lot. Vaccine continues at 1:10.  Asthma- No recent flares. Many office visits last year, but none now in 2 months, so he feels this is a very good winter. Expects some routine wheeze/ cough exerting in cold air. Uses neb two times a day- helps. Uses up Proair every 2-3 months.      Asthma History    Asthma Control Assessment:    Age range: 12+ years    Symptoms: 0-2 days/week    Nighttime Awakenings: 0-2/month    Interferes w/ normal activity: some limitations    SABA use (not for EIB): >2 days/week    FEV1: 1.45 liters (today)    FEV1 Pred: 2.04  liters (today)    Asthma Control Assessment: Not Well Controlled   Preventive Screening-Counseling & Management  Alcohol-Tobacco     Alcohol drinks/day: 0     Alcohol Counseling: not indicated; patient does not drink     Smoking Status: quit     Packs/Day: 2.5     Year Quit: 1981     Cans of tobacco/week: no     Passive Smoke Exposure: yes     Tobacco Counseling: to remain off tobacco products  Current Medications (verified): 1)  Spiriva Handihaler 18 Mcg Caps (Tiotropium Bromide Monohydrate) .[name].. 1 Daily 2)  Budesonide 0.25 Mg/61ml Susp (Budesonide) .[name].. 1 Neb Two Times A Day 3)  Albuterol Sulfate (2.5 Mg/71ml) 0.083% Nebu (Albuterol Sulfate) .[name].. 1 Vial Via Hhn Two Times A Day 4)  Diovan Hct 160-12.5 Mg  Tabs (Valsartan-Hydrochlorothiazide) .[name].. 1 By Mouth Two Times A Day 5)  Klor-Con M20 20 Meq Cr-Tabs (Potassium Chloride Crys Cr) .... Take 1 Tablet By Mouth Once A Day 6)  Clonidine Hcl 0.1 Mg Tabs (Clonidine Hcl) .[name].. 1 Tab By Mouth  Every Morning and 2 Tabs At Bedtime 7)  Pravastatin Sodium 80 Mg Tabs (Pravastatin Sodium) .... One Daily in The Evening 8)  Adult Aspirin Low Strength 81 Mg  Tbdp (Aspirin) .... Take 1 By Mouth Once Daily 9)  Phillips Colon Health  Caps (Probiotic Product) .... Take 1 Capsule By Mouth Once A Day 10)  Zyrtec Allergy 10 Mg Tabs (Cetirizine Hcl) .... Take 1 Tab By Mouth At Bedtime 11)  Prilosec 20 Mg Cpdr (Omeprazole) .[name].. 1 Capsule Daily  Before Meal 12)  Cpap 10 Cwp .... Wear At Bedtime 13)  Allergy Vaccine 1:10 Go (W-E) .... Every Friday 14)  Mucinex Dm 30-600 Mg Xr12h-Tab (Dextromethorphan-Guaifenesin) .... Take 1-2 Tablets Every 12 Hours As Needed 15)  Albuterol Sulfate (2.5 Mg/80ml) 0.083% Nebu (Albuterol Sulfate) .[name].. 1 Neb in [place] Every 3 Hours As Needed 16)  Proair Hfa 108 (90 Base) Mcg/act  Aers (Albuterol Sulfate) .... 2 Puffs Every 3 Hours As Needed 17)  Nitrostat 0.4 Mg Subl (Nitroglycerin) .[name].. 1 Under Tongue Every 5 Minutes X3 As  Needed For Chest Pain 18)  Provigil 100 Mg Tabs (Modafinil) .[name].. 1 Daily If Needed 19)  Xalatan 0.005 % Soln (Latanoprost) .[name].. 1 Drop Each Eye Once Daily 20)  Amlodipine Besylate 5 Mg Tabs (Amlodipine Besylate) .... Take 1 By Mouth At Bedtime  Allergies (verified): 1)  Codeine  Past History:  Past Medical History: Last updated: Nov 20, 2009 Allergic Rhinitis Asthma --July 28, 2009-Spirometry shows FEV1 1.45 (70%) Coronary Heart Disease- no MI Hypertension Sleep Apnea- NPSG  AHI 22/hr 10/06/01 Alcoholism hx Depression Hyperlipidemia Pneumonia GERD  MD roster: pulm - young cards - hochrein (prev tysinger) GI - perry derm - gould uro - kimbrough  Past Surgical History: Last updated: 2009/11/20 CABG (11/08-left internal mammary artery to        the left anterior descending, saphenous vein graft to diagonal,       sequential saphenous vein graft to posterior descending and       posterior lateral, saphenous vein graft to obtuse marginal       Endoscopic vein harvest of the right leg greater saphenous vein) Hernia Surgery (12/03/2007) Tonsillectomy   Family History: Last updated: 20-Nov-2009 Mother- deceased age 41; CHF Father- deceased age 48; heart attack Sister- COPD; Rheumatism, asthma, emphysema Brother- heart transplant   Social History: Last updated: 2009-11-20 Patient states former smoker. 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 Married , lives with wife 2 children Alcohol Use - no Daily Caffeine Use Illicit Drug Use - no   Risk Factors: Alcohol Use: 0 (05/20/2010) Caffeine Use: 3 (12/03/2007)  Risk Factors: Smoking Status: quit (05/20/2010) Packs/Day: 2.5 (05/20/2010) Cans of tobacco/wk: no (05/20/2010) Passive Smoke Exposure: yes (05/20/2010)  Social History: Packs/Day:  2.5  Review of Systems      See HPI  The patient denies anorexia, fever, weight loss, weight gain, vision loss, decreased hearing, hoarseness, chest pain, syncope, dyspnea  on exertion, peripheral edema, prolonged cough, headaches, hemoptysis, abdominal pain, severe indigestion/heartburn, unusual weight change, abnormal bleeding, enlarged lymph nodes, and angioedema.    Vital Signs:  Patient profile:   77 year old male Height:      62 inches Weight:      161.13 pounds BMI:     29.58 O2 Sat:      97 % on Room air Pulse rate:   74 / minute BP sitting:   162 / 88  (left arm) Cuff size:   regular  Vitals Entered By: Reynaldo Minium CMA (May 20, 2010 11:27 AM)  O2 Flow:  Room air CC: 4 month follow up visit-allergies.   Physical Exam  Additional Exam:  Additional Exam:  General: A/Ox3; pleasant and cooperative, NAD, hard of hearing,  SKIN: no rash, lesions, old tatoo NODES: no lymphadenopathy HEENT: [place]/AT, EOM- WNL, Conjuctivae- clear, PERRLA, TM-bilateral hearing aids, Nose- clear, Throat- clear and wnl, Mallampati  II NECK: Supple w/ fair ROM, JVD- none, normal carotid impulses w/o bruits Thyroid-  CHEST: quiet w/o wheeze. HEART:  RRR, no m/g/r heard ABDOMEN: Soft and nl; ZOX:WRUE, nl pulses, no edema  NEURO: Grossly intact to observation     Pre-Spirometry FEV1    Value: 1.45 L     Pred: 2.04 L     Impression & Recommendations:  Problem # 1:  ASTHMA (ICD-493.90) COPD with heavy smoking history, chronic obstructive asthma pattern. Clinically stable. The nebulizer does help and he maintains exercise for stamina.   Problem # 2:  ALLERGIC RHINITIS (ICD-477.9)  He continues allergy vaccine successfully.  His updated medication list for this problem includes:    Zyrtec Allergy 10 Mg Tabs (Cetirizine hcl) .[name]... Take 1 tab by mouth at bedtime  Problem # 3:  SLEEP APNEA (ICD-780.57)  Good compliance and control.   Other Orders: Est. Patient Level IV (45409)  Patient Instructions: 1)  Please schedule a follow-up appointment in 6 months. 2)  Continue CPAP at 10 3)  Continue allergy vaccine

## 2010-06-15 ENCOUNTER — Ambulatory Visit (INDEPENDENT_AMBULATORY_CARE_PROVIDER_SITE_OTHER): Payer: 59 | Admitting: Internal Medicine

## 2010-06-15 ENCOUNTER — Encounter: Payer: Self-pay | Admitting: Internal Medicine

## 2010-06-15 DIAGNOSIS — I10 Essential (primary) hypertension: Secondary | ICD-10-CM

## 2010-06-15 DIAGNOSIS — E785 Hyperlipidemia, unspecified: Secondary | ICD-10-CM

## 2010-06-21 NOTE — Assessment & Plan Note (Signed)
Summary: 4-6 MTH FU MOVED SOONER/ BP PROBLEMS---STC   Vital Signs:  Patient profile:   77 year old male Weight:      162 pounds (73.64 kg) O2 Sat:      97 % on Room air Temp:     98.4 degrees F (36.89 degrees C) oral Pulse rate:   72 / minute BP sitting:   120 / 62  (left arm) Cuff size:   regular  Vitals Entered By: Orlan Leavens RMA (June 15, 2010 10:45 AM)  O2 Flow:  Room air CC: 6 week follow-up Is Patient Diabetic? No Pain Assessment Patient in pain? no        Primary Care Provider:  Newt Lukes MD  CC:  6 week follow-up.  History of Present Illness: here for f/u here with wife who provdes much of the hx concerned about uncontrolled BP at home - home log reviewed  reviewed med issues today: 1) CAD s/p CABG 2008 - follows with cards for same - s/p treadmill stress test in 11/2009 - -reports compliance with ongoing medical treatment and no changes in medication dose or frequency. denies adverse side effects related to current therapy. no CP or anginal symptoms   2) HTN - reports compliance with ongoing medical treatment and no changes in medication dose or frequency. denies adverse side effects related to current therapy. no edema or HA or weakness  3) dyslipidemia - changed from simva to prava 6/2011due to FDA warnings - reports compliance with ongoing medical treatment and no other changes in medication dose or frequency. denies adverse side effects related to current therapy. no muscle pain or weakness  4) asthma, remote tobacco - follows with pulm for same - frequent flares related to weather change - reports compliance with ongoing nubulizer treatment and no changes in medication dose or frequency. denies adverse side effects related to current therapy.   Clinical Review Panels:  Immunizations   Last Flu Vaccine:  Fluvax 3+ (12/30/2009)   Last Pneumovax:  Historical (02/20/2001)  Lipid Management   Cholesterol:  109 (11/29/2009)   LDL (bad choesterol):   61 (11/29/2009)   HDL (good cholesterol):  32.90 (11/29/2009)   Triglycerides:  53 (07/12/2009)  Complete Metabolic Panel   Glucose:  110 (12/03/2008)   Sodium:  139 (12/03/2008)   Potassium:  3.8 (12/03/2008)   Chloride:  98 (12/03/2008)   CO2:  24 (12/03/2008)   BUN:  13 (12/03/2008)   Creatinine:  0.92 (12/03/2008)   Albumin:  3.9 (11/29/2009)   Total Protein:  6.2 (11/29/2009)   Calcium:  8.8 (12/03/2008)   Total Bili:  0.6 (11/29/2009)   Alk Phos:  57 (11/29/2009)   SGPT (ALT):  23 (11/29/2009)   SGOT (AST):  22 (11/29/2009)   Current Medications (verified): 1)  Spiriva Handihaler 18 Mcg Caps (Tiotropium Bromide Monohydrate) .Marland Kitchen.. 1 Daily 2)  Budesonide 0.25 Mg/65ml Susp (Budesonide) .Marland Kitchen.. 1 Neb Two Times A Day 3)  Albuterol Sulfate (2.5 Mg/44ml) 0.083% Nebu (Albuterol Sulfate) .Marland Kitchen.. 1 Vial Via Hhn Two Times A Day 4)  Diovan Hct 160-12.5 Mg  Tabs (Valsartan-Hydrochlorothiazide) .Marland Kitchen.. 1 By Mouth Two Times A Day 5)  Klor-Con M20 20 Meq Cr-Tabs (Potassium Chloride Crys Cr) .... Take 1 Tablet By Mouth Once A Day 6)  Clonidine Hcl 0.1 Mg Tabs (Clonidine Hcl) .Marland Kitchen.. 1 Tab By Mouth  Every Morning and 2 Tabs At Bedtime 7)  Pravastatin Sodium 80 Mg Tabs (Pravastatin Sodium) .... One Daily in The Evening 8)  Adult Aspirin Low Strength 81 Mg  Tbdp (Aspirin) .... Take 1 By Mouth Once Daily 9)  Phillips Colon Health  Caps (Probiotic Product) .... Take 1 Capsule By Mouth Once A Day 10)  Zyrtec Allergy 10 Mg Tabs (Cetirizine Hcl) .... Take 1 Tab By Mouth At Bedtime 11)  Prilosec 20 Mg Cpdr (Omeprazole) .Marland Kitchen.. 1 Capsule Daily Before Meal 12)  Cpap 10 Cwp .... Wear At Bedtime 13)  Allergy Vaccine 1:10 Go (W-E) .... Every Friday 14)  Mucinex Dm 30-600 Mg Xr12h-Tab (Dextromethorphan-Guaifenesin) .... Take 1-2 Tablets Every 12 Hours As Needed 15)  Albuterol Sulfate (2.5 Mg/24ml) 0.083% Nebu (Albuterol Sulfate) .Marland Kitchen.. 1 Neb in [place] Every 3 Hours As Needed 16)  Proair Hfa 108 (90 Base) Mcg/act  Aers  (Albuterol Sulfate) .... 2 Puffs Every 3 Hours As Needed 17)  Nitrostat 0.4 Mg Subl (Nitroglycerin) .Marland Kitchen.. 1 Under Tongue Every 5 Minutes X3 As Needed For Chest Pain 18)  Provigil 100 Mg Tabs (Modafinil) .Marland Kitchen.. 1 Daily If Needed 19)  Xalatan 0.005 % Soln (Latanoprost) .Marland Kitchen.. 1 Drop Each Eye Once Daily 20)  Amlodipine Besylate 5 Mg Tabs (Amlodipine Besylate) .... Take 1 By Mouth Two Times A Day  Allergies (verified): 1)  Codeine  Past History:  Past Medical History: Allergic Rhinitis Asthma --July 28, 2009-Spirometry shows FEV1 1.45 (70%) Coronary Heart Disease- no MI  Hypertension Sleep Apnea- NPSG  AHI 22/hr 10/06/01 Alcoholism hx Depression Hyperlipidemia GERD  MD roster: pulm - young cards - hochrein (prev tysinger) GI - perry derm - gould uro - kimbrough  Review of Systems  The patient denies chest pain, headaches, and abdominal pain.    Physical Exam  General:  Well developed, well nourished, in no acute distress. wife at side Lungs:  normal respiratory effort, no intercostal retractions or use of accessory muscles; normal breath sounds bilaterally - no crackles and no wheezes.    Heart:  normal rate, regular rhythm, no murmur, and no rub. BLE without edema. Psych:  Oriented X3, memory intact for recent and remote, normally interactive, good eye contact, not anxious appearing, not depressed appearing, and not agitated.      Impression & Recommendations:  Problem # 1:  HYPERTENSION (ICD-401.9)  home BP reviewed - great variability during day despite normal BP here now change dosing of clonidine but no change dose -  cont same amlodipine and diovan hct rechck 4-6 week, sooner if probs His updated medication list for this problem includes:    Diovan Hct 160-12.5 Mg Tabs (Valsartan-hydrochlorothiazide) .Marland Kitchen... 1 by mouth two times a day    Clonidine Hcl 0.1 Mg Tabs (Clonidine hcl) .Marland Kitchen... 1 tab by mouth three times a day    Amlodipine Besylate 5 Mg Tabs (Amlodipine  besylate) .Marland Kitchen... Take 1 by mouth two times a day  BP today: 120/62 Prior BP: 138/78 (05/20/2010)  Prior 10 Yr Risk Heart Disease: N/A (11/29/2009)  Labs Reviewed: K+: 3.8 (12/03/2008) Creat: : 0.92 (12/03/2008)   Chol: 109 (11/29/2009)   HDL: 32.90 (11/29/2009)   LDL: 61 (11/29/2009)   TG: 77.0 (11/29/2009)  Time spent with patient/wife 25 minutes, more than 50% of this time was spent counseling patient on blood pressure, anxious symptoms  and med review  Problem # 2:  DYSLIPIDEMIA (ICD-272.4) reviewed last labs and risk of med tx with pt/wife today - no change rec His updated medication list for this problem includes:    Pravastatin Sodium 80 Mg Tabs (Pravastatin sodium) ..... One daily in the evening  Labs Reviewed: SGOT: 22 (11/29/2009)   SGPT: 23 (11/29/2009)  Prior 10 Yr Risk Heart Disease: N/A (11/29/2009)   HDL:32.90 (11/29/2009), 63 (07/12/2009)  LDL:61 (11/29/2009), 62 (07/12/2009)  Chol:109 (11/29/2009), 136 (07/12/2009)  Trig:77.0 (11/29/2009), 53 (07/12/2009)  Complete Medication List: 1)  Spiriva Handihaler 18 Mcg Caps (Tiotropium bromide monohydrate) .Marland Kitchen.. 1 daily 2)  Budesonide 0.25 Mg/61ml Susp (Budesonide) .Marland Kitchen.. 1 neb two times a day 3)  Albuterol Sulfate (2.5 Mg/33ml) 0.083% Nebu (Albuterol sulfate) .Marland Kitchen.. 1 vial via hhn two times a day 4)  Diovan Hct 160-12.5 Mg Tabs (Valsartan-hydrochlorothiazide) .Marland Kitchen.. 1 by mouth two times a day 5)  Klor-con M20 20 Meq Cr-tabs (Potassium chloride crys cr) .... Take 1 tablet by mouth once a day 6)  Clonidine Hcl 0.1 Mg Tabs (Clonidine hcl) .Marland Kitchen.. 1 tab by mouth three times a day 7)  Pravastatin Sodium 80 Mg Tabs (Pravastatin sodium) .... One daily in the evening 8)  Adult Aspirin Low Strength 81 Mg Tbdp (Aspirin) .... Take 1 by mouth once daily 9)  Phillips Colon Health Caps (Probiotic product) .... Take 1 capsule by mouth once a day 10)  Zyrtec Allergy 10 Mg Tabs (Cetirizine hcl) .... Take 1 tab by mouth at bedtime 11)  Prilosec 20 Mg  Cpdr (Omeprazole) .Marland Kitchen.. 1 capsule daily before meal 12)  Cpap 10 Cwp  .... Wear at bedtime 13)  Allergy Vaccine 1:10 Go (w-e)  .... Every friday 14)  Mucinex Dm 30-600 Mg Xr12h-tab (Dextromethorphan-guaifenesin) .... Take 1-2 tablets every 12 hours as needed 15)  Albuterol Sulfate (2.5 Mg/61ml) 0.083% Nebu (Albuterol sulfate) .Marland Kitchen.. 1 neb in vial every 3 hours as needed 16)  Proair Hfa 108 (90 Base) Mcg/act Aers (Albuterol sulfate) .... 2 puffs every 3 hours as needed 17)  Nitrostat 0.4 Mg Subl (Nitroglycerin) .Marland Kitchen.. 1 under tongue every 5 minutes x3 as needed for chest pain 18)  Provigil 100 Mg Tabs (Modafinil) .Marland Kitchen.. 1 daily if needed 19)  Xalatan 0.005 % Soln (Latanoprost) .Marland Kitchen.. 1 drop each eye once daily 20)  Amlodipine Besylate 5 Mg Tabs (Amlodipine besylate) .... Take 1 by mouth two times a day  Patient Instructions: 1)  it was good to see you today. 2)  change clonidine to 1 pill three times a day  3)  other medications reviewed - continue amlodipine 5mg  two times a day and Diovan hct two times a day  4)  continue to be active as you are doing! 5)  Please schedule a follow-up appointment in 4-6 months for general review and blood pressure check, call sooner if problems.  6)  Check your Blood Pressure regularly. If it is above: 140s/90s you should make an appointment.   Orders Added: 1)  Est. Patient Level IV [78469]

## 2010-06-24 ENCOUNTER — Inpatient Hospital Stay (HOSPITAL_COMMUNITY)
Admission: EM | Admit: 2010-06-24 | Discharge: 2010-06-28 | DRG: 247 | Disposition: A | Discharge status: Home / Self Care | Payer: Medicare Other | Attending: Internal Medicine | Admitting: Internal Medicine

## 2010-06-24 DIAGNOSIS — I2510 Atherosclerotic heart disease of native coronary artery without angina pectoris: Principal | ICD-10-CM | POA: Diagnosis present

## 2010-06-24 DIAGNOSIS — R000 Tachycardia, unspecified: Secondary | ICD-10-CM | POA: Diagnosis present

## 2010-06-24 DIAGNOSIS — J45909 Unspecified asthma, uncomplicated: Secondary | ICD-10-CM | POA: Diagnosis present

## 2010-06-24 DIAGNOSIS — R0789 Other chest pain: Secondary | ICD-10-CM | POA: Diagnosis present

## 2010-06-24 DIAGNOSIS — Z951 Presence of aortocoronary bypass graft: Secondary | ICD-10-CM

## 2010-06-24 DIAGNOSIS — I739 Peripheral vascular disease, unspecified: Secondary | ICD-10-CM | POA: Diagnosis present

## 2010-06-24 DIAGNOSIS — Z7982 Long term (current) use of aspirin: Secondary | ICD-10-CM

## 2010-06-24 DIAGNOSIS — Z7902 Long term (current) use of antithrombotics/antiplatelets: Secondary | ICD-10-CM

## 2010-06-25 ENCOUNTER — Emergency Department (HOSPITAL_COMMUNITY): Payer: Medicare Other

## 2010-06-25 LAB — CBC
HCT: 38.2 % — ABNORMAL LOW (ref 39.0–52.0)
Hemoglobin: 13 g/dL (ref 13.0–17.0)
MCH: 29.7 pg (ref 26.0–34.0)
MCHC: 34 g/dL (ref 30.0–36.0)
MCV: 87.4 fL (ref 78.0–100.0)
Platelets: 186 K/uL (ref 150–400)
RBC: 4.37 MIL/uL (ref 4.22–5.81)
RDW: 13.1 % (ref 11.5–15.5)
WBC: 6.7 K/uL (ref 4.0–10.5)

## 2010-06-25 LAB — DIFFERENTIAL
Basophils Absolute: 0 K/uL (ref 0.0–0.1)
Basophils Relative: 0 % (ref 0–1)
Eosinophils Absolute: 0.1 K/uL (ref 0.0–0.7)
Eosinophils Relative: 2 % (ref 0–5)
Lymphocytes Relative: 19 % (ref 12–46)
Lymphs Abs: 1.2 K/uL (ref 0.7–4.0)
Monocytes Absolute: 0.6 K/uL (ref 0.1–1.0)
Monocytes Relative: 9 % (ref 3–12)
Neutro Abs: 4.8 K/uL (ref 1.7–7.7)
Neutrophils Relative %: 71 % (ref 43–77)

## 2010-06-25 LAB — CARDIAC PANEL(CRET KIN+CKTOT+MB+TROPI)
CK, MB: 3.5 ng/mL (ref 0.3–4.0)
Relative Index: INVALID (ref 0.0–2.5)
Total CK: 96 U/L (ref 7–232)

## 2010-06-25 LAB — POCT I-STAT, CHEM 8
Chloride: 102 mEq/L (ref 96–112)
Glucose, Bld: 150 mg/dL — ABNORMAL HIGH (ref 70–99)
HCT: 38 % — ABNORMAL LOW (ref 39.0–52.0)
Hemoglobin: 12.9 g/dL — ABNORMAL LOW (ref 13.0–17.0)
Potassium: 2.9 mEq/L — ABNORMAL LOW (ref 3.5–5.1)
Sodium: 140 mEq/L (ref 135–145)

## 2010-06-25 LAB — CK TOTAL AND CKMB (NOT AT ARMC)
CK, MB: 3.2 ng/mL (ref 0.3–4.0)
Relative Index: INVALID (ref 0.0–2.5)
Total CK: 89 U/L (ref 7–232)

## 2010-06-25 LAB — TSH: TSH: 1.718 u[IU]/mL (ref 0.350–4.500)

## 2010-06-25 LAB — LIPID PANEL
Cholesterol: 113 mg/dL (ref 0–200)
LDL Cholesterol: 67 mg/dL (ref 0–99)
VLDL: 7 mg/dL (ref 0–40)

## 2010-06-25 LAB — TROPONIN I

## 2010-06-25 LAB — BRAIN NATRIURETIC PEPTIDE: Pro B Natriuretic peptide (BNP): 93 pg/mL (ref 0.0–100.0)

## 2010-06-25 LAB — POTASSIUM: Potassium: 3.6 meq/L (ref 3.5–5.1)

## 2010-06-25 MED ORDER — IOHEXOL 300 MG/ML  SOLN
100.0000 mL | Freq: Once | INTRAMUSCULAR | Status: AC | PRN
  Administered 2010-06-25: 100 mL via INTRAVENOUS

## 2010-06-26 LAB — BASIC METABOLIC PANEL
Chloride: 107 mEq/L (ref 96–112)
Chloride: 108 mEq/L (ref 96–112)
Creatinine, Ser: 0.7 mg/dL (ref 0.4–1.5)
GFR calc Af Amer: >60 mL/min (ref >60)
GFR calc Af Amer: >60 mL/min (ref >60)
Potassium: 4 mEq/L (ref 3.5–5.1)
Sodium: 140 mEq/L (ref 135–145)

## 2010-06-26 LAB — PROTIME-INR: INR: 0.94 (ref 0.00–1.49)

## 2010-06-26 LAB — GLUCOSE, CAPILLARY: Glucose-Capillary: 124 mg/dL — ABNORMAL HIGH (ref 70–99)

## 2010-06-26 LAB — CBC
MCH: 30.7 pg (ref 26.0–34.0)
Platelets: 199 10*3/uL (ref 150–400)
Platelets: 217 10*3/uL (ref 150–400)
RBC: 4.86 MIL/uL (ref 4.22–5.81)
RBC: 4.93 MIL/uL (ref 4.22–5.81)
WBC: 7.6 10*3/uL (ref 4.0–10.5)
WBC: 10.9 10*3/uL — ABNORMAL HIGH (ref 4.0–10.5)

## 2010-06-26 LAB — DIFFERENTIAL
Basophils Relative: 0 % (ref 0–1)
Eosinophils Absolute: 0 10*3/uL (ref 0.0–0.7)
Neutrophils Relative %: 79 % — ABNORMAL HIGH (ref 43–77)

## 2010-06-26 LAB — MAGNESIUM: Magnesium: 2 mg/dL (ref 1.5–2.5)

## 2010-06-26 NOTE — Consult Note (Signed)
[name], [name]                 ACCOUNT NO.:  [id number]  MEDICAL RECORD NO.:  [id number]           PATIENT TYPE:  I  LOCATION:  2013                         FACILITY:  [facility]  PHYSICIAN:  Armanda Magic, M.D.     DATE OF BIRTH:  05/28/1930  DATE OF CONSULTATION:  06/25/2010 DATE OF DISCHARGE:                                CONSULTATION   REFERRING PHYSICIAN:  [facility].  CHIEF COMPLAINT:  Tachycardia, palpitations, hypertension, shortness of breath.  HISTORY OF PRESENT ILLNESS:  This is a 77 year old male with a history of labile hypertension, CAD, status post CABG, obstructive sleep apnea, paroxysmal AFib who presented to the emergency room with complaints of 1- week history of dyspnea on exertion with palpitations.  He does state that he complains of occasional chest pain but is resolved taking a sublingual nitroglycerin.  He has had no lower extremity edema.  He says in the emergency room, he was noted to have hypokalemia with a potassium of 2.9.  A D-dimer was also elevated and CT angio of the chest was done which showed no evidence of pulmonary embolism.  We are now asked to consult for help with the patient's blood pressure management as well as assess his heart rhythm.  The patient states that at home he will intermittently notice his heart rate going up into the 120s-130s on his blood pressure monitor when he checks his blood pressure.  He also notices intermittently that his heart will be racing.  Apparently, he has been having some issues with elevated blood pressure and initially had been on clonidine 0.1 mg in the morning and 0.2 mg in the evening but when he saw his primary doctor recently, she increased him to 0.1 mg in the morning, 0.1 mg at lunch, and 0.2 mg at night but that made him too sleepy, so he went back to taking 0.1 mg in the morning, 0.2 mg in the evening.  He was noted on the heart monitor to have an episode of wide complex  tachycardia that is somewhat irregular and is unclear as to whether this is nonsustained ventricular tachycardia in the setting of hypokalemia versus atrial fibrillation with aberration.  PAST MEDICAL HISTORY:  CAD status post CABG with a LIMA to the LAD, saphenous vein graft to the diagonal 1, saphenous vein graft sequentially to the PDA and PLA and saphenous vein graft to the OM, allergic rhinitis, asthma, hypertension, obstructive sleep apnea, alcoholism, depression, dyslipidemia, history of pneumonia.  ALLERGIES:  CODEINE.  PAST SURGICAL HISTORY:  CABG and hernia surgery.  FAMILY HISTORY:  His mother died at 72 of CHF.  His father died at 36 of MI.  He has a sister with COPD and a brother at 70 with heart transplant.  SOCIAL HISTORY:  Has a remote history of tobacco use but quit 25 years ago.  He is married with 2 children.  He denies any alcohol or IV drug use.  REVIEW OF SYSTEMS:  Otherwise stated in the HPI is negative.  MEDICATIONS:  Norvasc 5 mg b.i.d., albuterol, aspirin 81 mg daily, desonide, clonidine 0.1  mg t.i.d., Diovan HCT 160/12.5 mg b.i.d., Mucinex, KCl 20 mEq daily, pravastatin 80 mg daily, Prilosec 20 mg daily, Provigil p.r.n., Spiriva, Dilantin, and Zyrtec.  PHYSICAL EXAMINATION:  VITAL SIGNS:  Blood pressure is 138/69, heart rate 95, O2 saturation 96% on room air. GENERAL:  This is a well-developed, well-nourished white male in no acute distress. HEENT:  Benign. NECK:  Supple without lymphadenopathy.  Carotid upstrokes are +2 bilaterally, no bruits. LUNGS:  Clear to auscultation throughout. HEART:  Regular rate and rhythm.  No murmurs, rubs, or gallops.  Normal S1, S2. ABDOMEN:  Soft, nontender, nondistended with active bowel sounds.  No hepatosplenomegaly. EXTREMITIES:  No cyanosis, erythema, or edema.  LABORATORY DATA:  Sodium 140, potassium 2.9, chloride 102, bicarb 25, BUN 26, creatinine 1.1, glucose 150.  White cell count 6.7, hemoglobin 13,  hematocrit 38.2, platelet count 186,000.  CPK 89, MB 3.2, troponin 0.001.  BNP 93.  Chest x-ray shows no active disease.  EKG shows normal sinus rhythm with PAC.  Telemetry showed episode of wide complex tachycardia which was irregular, possibly ventricular tachycardia versus AFib with aberration.  Chest CT showed no evidence of PE.  ASSESSMENT: 1. Dyspnea on exertion possibly secondary to poorly controlled     hypertension versus ischemia versus recurrent paroxysmal atrial     fibrillation.  BMP is normal.  EKG is nonischemic.  Telemetry does     show one episode of wide complex tachycardia.  With his complaints     of palpitation, history of atrial fibrillation now off amiodarone,     need to consider recurrent paroxysms of atrial fibrillation that     may be causing his shortness of breath. 2. Chest pain sporadically which is resolved with sublingual     nitroglycerin. 3. Wide complex tachycardia in the setting of hypokalemia.  This may     be ventricular tachycardia but also could be paroxysmal atrial     fibrillation with aberration.  His rhythm is somewhat irregular     which would favor a more paroxysmal atrial fibrillation. 4. Hypokalemia. 5. Hypertension currently controlled. 6. Coronary artery disease status post CABG. 7. Palpitations possibly paroxysmal atrial fibrillation versus     ventricular tachycardia in an outpatient setting.  PLAN:  Replete potassium.  Follow on telemetry.  We will check a 2-D echocardiogram today.  If his EF is normal, then would consider stress Cardiolite.  If all enzymes come back negative, if EF is down, we would consider cardiac catheterization.  We will continue to follow with you.     Armanda Magic, M.D.     TT/MEDQ  D:  06/25/2010  T:  06/26/2010  Job:  [id number]  Electronically Signed by Armanda Magic M.D. on 06/26/2010 11:15:45 AM

## 2010-06-27 DIAGNOSIS — I2510 Atherosclerotic heart disease of native coronary artery without angina pectoris: Secondary | ICD-10-CM

## 2010-06-27 DIAGNOSIS — I25810 Atherosclerosis of coronary artery bypass graft(s) without angina pectoris: Secondary | ICD-10-CM

## 2010-06-27 LAB — CBC
Hemoglobin: 14.4 g/dL (ref 13.0–17.0)
MCH: 30.7 pg (ref 26.0–34.0)
MCV: 89.8 fL (ref 78.0–100.0)
Platelets: 213 10*3/uL (ref 150–400)
RBC: 4.69 MIL/uL (ref 4.22–5.81)
WBC: 9.5 10*3/uL (ref 4.0–10.5)

## 2010-06-27 LAB — HEPARIN LEVEL (UNFRACTIONATED): Heparin Unfractionated: 0.35 IU/mL (ref 0.30–0.70)

## 2010-06-28 LAB — BASIC METABOLIC PANEL
Chloride: 111 mEq/L (ref 96–112)
GFR calc Af Amer: >60 mL/min (ref >60)
GFR calc non Af Amer: >60 mL/min (ref >60)
Potassium: 3.7 mEq/L (ref 3.5–5.1)
Sodium: 141 mEq/L (ref 135–145)

## 2010-06-28 LAB — HEPARIN LEVEL (UNFRACTIONATED): Heparin Unfractionated: <0.1 IU/mL — ABNORMAL LOW (ref 0.30–0.70)

## 2010-06-28 LAB — MAGNESIUM: Magnesium: 1.9 mg/dL (ref 1.5–2.5)

## 2010-06-28 LAB — CBC
Hemoglobin: 12.4 g/dL — ABNORMAL LOW (ref 13.0–17.0)
Platelets: 188 10*3/uL (ref 150–400)
RBC: 4.17 MIL/uL — ABNORMAL LOW (ref 4.22–5.81)
WBC: 8.4 10*3/uL (ref 4.0–10.5)

## 2010-06-28 NOTE — Procedures (Signed)
  NAMEADEMOLA, VERT                 ACCOUNT NO.:  [id number]  MEDICAL RECORD NO.:  [id number]           PATIENT TYPE:  I  LOCATION:  6527                         FACILITY:  [facility]  PHYSICIAN:  Verne Carrow, MDDATE OF BIRTH:  1931/03/27  DATE OF PROCEDURE:  06/27/2010 DATE OF DISCHARGE:                           CARDIAC CATHETERIZATION   PRIMARY CARDIOLOGIST:  Rollene Rotunda, MD, [facility]  PRIMARY CARE PHYSICIAN:  Raenette Rover. Felicity Coyer, MD  PROCEDURE PERFORMED:  PTCA with placement of a drug-eluting stent in the ostium of the saphenous vein graft to the posterior descending artery.  OPERATOR:  Verne Carrow, MD  INDICATIONS:  This is a 77 year old Caucasian male with previous bypass surgery, who was admitted to the hospital with dyspnea.  Diagnostic catheterization was performed by Dr. Kristeen Miss earlier this afternoon.  The patient was found to have severe stenosis in the ostium of the saphenous vein graft to the posterior descending artery.  I reviewed the films with Dr. Elease Hashimoto and we agreed to proceed with a coronary intervention.  DETAILS OF PROCEDURE:  When I entered the case, the patient had a 6- [place] sheath present in the right femoral artery.  We selectively engaged the saphenous vein graft to the posterior descending artery with an RCB catheter.  The patient was given a bolus of Angiomax and drip was started.  The patient was given 600 mg of Plavix.  When the ACT was greater than 200,  I passed a Cougar intracoronary wire down the length of the saphenous vein graft.  We then switched out this wire for a 3.00 Spider filter wire.  The initial Cougar wire was removed.  With the filter wire in place, I then used a 2.0 x 12-mm balloon to predilate the lesion in the ostium of the saphenous vein graft.  A second balloon inflation was done with half of the balloon hanging out into the aorta just to make sure we cover the ostium.  A 2.25 x 12-mm promise  element drug-eluting stent was carefully positioned in the ostium of the vessel and was deployed without difficulty.  There was an excellent angiographic result.  We retrieved our filter without difficulty.  There was excellent flow down the vein graft and into the target vessel.  The stenosis was taken from 99% down to 0%.  IMPRESSION:  Successful PTCA with placement of a drug-eluting stent in the ostium of the saphenous vein graft to the posterior descending artery.  RECOMMENDATIONS:  The patient will be continued on aspirin and Plavix for at least 1 year.  We will continue his other cardiac medications per his treatment plan.     Verne Carrow, MD     CM/MEDQ  D:  06/27/2010  T:  06/28/2010  Job:  [id number]  cc:   Rollene Rotunda, MD, [facility]. Felicity Coyer, MD  Electronically Signed by Verne Carrow MD on 06/28/2010 10:09:26 AM

## 2010-07-01 ENCOUNTER — Encounter: Payer: Self-pay | Admitting: Internal Medicine

## 2010-07-01 LAB — METANEPHRINES, URINE, 24 HOUR
Metaneph Total, Ur: 611 mcg/24 h (ref 224–832)
Metanephrines, Ur: 117 mcg/24 h (ref 90–315)
Normetanephrine, 24H Ur: 494 mcg/24 h (ref 122–676)
Volume, Urine-METAN: 750 mL

## 2010-07-01 LAB — VMA + CREATININE, URINE (TIMED COLLECTION)
Creatinine 24h urine: 0.78 g/(24.h) (ref 0.63–2.50)
Vanillylmandelic Acid, (VMA): 6.7 mg/24 h — ABNORMAL HIGH (ref <=6.0)
Volume, Urine-VMAUR: 750 mL

## 2010-07-01 LAB — CULTURE, BLOOD (ROUTINE X 2): Culture: NO GROWTH

## 2010-07-01 LAB — CATECHOLAMINES, FRACTIONATED, URINE, 24 HOUR
Epinephrine 24 Hr Urine: 8 mcg/24 h (ref 2–24)
Total urine volume: 750 mL

## 2010-07-02 NOTE — H&P (Signed)
[name], [name]                 ACCOUNT NO.:  [id number]  MEDICAL RECORD NO.:  [id number]           PATIENT TYPE:  E  LOCATION:  [facility]                         FACILITY:  [facility]  PHYSICIAN:  Houston Siren, MD           DATE OF BIRTH:  1930/06/19  DATE OF ADMISSION:  06/24/2010 DATE OF DISCHARGE:                             HISTORY & PHYSICAL   PRIMARY CARE PHYSICIAN:  Valerie A. Felicity Coyer, MD  PULMONOLOGIST:  Rennis Chris. Maple Hudson, MD, [place], FACP  CARDIOLOGIST:  Rollene Rotunda, MD, [facility]  ADVANCE DIRECTIVE:  Full code.  REASON FOR ADMISSION:  Palpitation and dyspnea on exertion.  HISTORY OF PRESENT ILLNESS:  This is a 77 year old male with a history of very labile hypertension, coronary artery disease, status post bypass graft x5 three years ago, history of sleep apnea, asthma, peripheral vascular disease, prior tobacco use who presented to the emergency room with 1-week history of increased dyspnea on exertion and frequent palpitations.  He denies any chest pain, cough, fever, chills, or any symptoms suggestive of congestive heart failure.  He did feel some palpitations as well.  He has been given Provigil when he drives, but he has not taken this medication recently.  Emergency room evaluation included an EKG, which shows normal sinus rhythm and no acute ST-T changes.  He has hypokalemia with a potassium of 2.9, creatinine of 1.1. His chest x-ray was negative except for mild cardiomegaly.  He has a positive D-dimer at 0.66 with followup CT pulmonary angiogram negative for any evidence of pulmonary embolism.  He has normal white count and hemoglobin.  His cardiac enzymes were also negative as well.  It was felt that his blood pressure has not been in good control and because there is definite increased dyspnea on exertion this past week, Hospitalist was asked to admit the patient for further evaluation of his dyspnea on exertion and to adjust his medications to get better  blood pressure control.  PAST MEDICAL HISTORY:  As above.  CURRENT MEDICATIONS: 1. Albuterol. 2. Norvasc 5 mg b.i.d. 3. Aspirin 81 mg per day. 4. Desonide. 5. Clonidine 0.1 mg t.i.d. 6. Diovan HCT 160/12.5 mg b.i.d. 7. Mucinex. 8. Nitrostat p.r.n. 9. Potassium supplement 20 mEq per day. 10.Pravastatin 80 mg per day. 11.Prilosec 20 mg per day. 12.Provigil as needed. 13.Spiriva. 14.Dilantin 1 drop once a day. 15.Zyrtec.  ALLERGIES:  CODEINE.  FAMILY HISTORY:  Noncontributory.  REVIEW OF SYSTEMS:  Otherwise unremarkable.  PHYSICAL EXAMINATION:  VITAL SIGNS:  Blood pressure 128/55, pulse of 75, respiratory rate of 14, temperature 98.3. GENERAL:  He is alert and oriented and is in no apparent distress. HEENT:  Sclerae are nonicteric. NECK:  Supple.  No stridor.  No lymphadenopathy. CARDIAC:  S1 and S2, regular, I did not hear any S3 or S4. LUNGS:  Decreased breath sounds throughout with minimal amount of wheezes, but no crackle. ABDOMEN:  Obese, nondistended, and nontender.  No palpable mass. EXTREMITIES:  No edema.  No calf tenderness.  Good distal pulses bilaterally. SKIN:  Warm and dry. NEUROLOGIC:  Unremarkable. PSYCHIATRIC:  Unremarkable.  He  does have neither central, nor peripheral cyanosis.  OBJECTIVE FINDINGS:  EKG shows sinus rhythm without any acute ST-T changes.  CT pulmonary angiogram showed no evidence of pulmonary embolism.  Chest x-ray shows mild cardiomegaly, but otherwise negative. D-dimer 0.66.  White count 6700, hemoglobin of 13.0, platelet count of 186,000.  Troponin less than 0.05.  IMPRESSION:  This is a 77 year old male with prior history of tobacco use, asthma, known coronary artery disease, labile hypertension, and polypharmacy with difficulty controlling his blood pressure.  We will admit him to telemetry floor.  We will rule out with serial CPKs and troponins.  It looks like clonidine is working for him, but is labile. We will change it to  TTS 1 q weekly.  His dyspnea on exertion I suspect is multifactorial including deconditioning, pulmonary and possibly coronary artery disease as well.  We will get serial CPKs and troponins. Please consult Dr. Antoine Poche tomorrow, as I suspect he will benefit getting a stress test.  We will discontinue his Provigil, as it may be the reason for his blood pressure elevation and tachycardia.  We will replete his potassium.  I would like to continue Spiriva.  He is a full code and will be admitted to telemetry unit team [facility] 3.     Houston Siren, MD     PL/MEDQ  D:  06/25/2010  T:  06/25/2010  Job:  [id number]  cc:   Vikki Ports A. Felicity Coyer, MD [place] D. Maple Hudson, MD, FCCP, FACP Rollene Rotunda, MD, [facility]  Electronically Signed by Houston Siren  on 07/02/2010 06:01:54 AM

## 2010-07-03 ENCOUNTER — Telehealth: Payer: Self-pay | Admitting: Physician Assistant

## 2010-07-03 ENCOUNTER — Other Ambulatory Visit: Payer: Self-pay | Admitting: Emergency Medicine

## 2010-07-03 ENCOUNTER — Emergency Department (HOSPITAL_COMMUNITY)
Admission: EM | Admit: 2010-07-03 | Discharge: 2010-07-03 | Disposition: A | Discharge status: Home / Self Care | Payer: Medicare Other | Attending: Emergency Medicine | Admitting: Emergency Medicine

## 2010-07-03 DIAGNOSIS — R5381 Other malaise: Secondary | ICD-10-CM | POA: Insufficient documentation

## 2010-07-03 DIAGNOSIS — S301XXA Contusion of abdominal wall, initial encounter: Secondary | ICD-10-CM | POA: Insufficient documentation

## 2010-07-03 DIAGNOSIS — I739 Peripheral vascular disease, unspecified: Secondary | ICD-10-CM | POA: Insufficient documentation

## 2010-07-03 DIAGNOSIS — Z79899 Other long term (current) drug therapy: Secondary | ICD-10-CM | POA: Insufficient documentation

## 2010-07-03 DIAGNOSIS — S3011XA Contusion of abdominal wall, initial encounter: Secondary | ICD-10-CM

## 2010-07-03 DIAGNOSIS — I2510 Atherosclerotic heart disease of native coronary artery without angina pectoris: Secondary | ICD-10-CM | POA: Insufficient documentation

## 2010-07-03 DIAGNOSIS — R10819 Abdominal tenderness, unspecified site: Secondary | ICD-10-CM | POA: Insufficient documentation

## 2010-07-03 DIAGNOSIS — Y929 Unspecified place or not applicable: Secondary | ICD-10-CM | POA: Insufficient documentation

## 2010-07-03 DIAGNOSIS — J45909 Unspecified asthma, uncomplicated: Secondary | ICD-10-CM | POA: Insufficient documentation

## 2010-07-03 DIAGNOSIS — R1909 Other intra-abdominal and pelvic swelling, mass and lump: Secondary | ICD-10-CM | POA: Insufficient documentation

## 2010-07-03 DIAGNOSIS — R509 Fever, unspecified: Secondary | ICD-10-CM | POA: Insufficient documentation

## 2010-07-03 DIAGNOSIS — M79609 Pain in unspecified limb: Secondary | ICD-10-CM

## 2010-07-03 DIAGNOSIS — R109 Unspecified abdominal pain: Secondary | ICD-10-CM | POA: Insufficient documentation

## 2010-07-03 DIAGNOSIS — Y840 Cardiac catheterization as the cause of abnormal reaction of the patient, or of later complication, without mention of misadventure at the time of the procedure: Secondary | ICD-10-CM | POA: Insufficient documentation

## 2010-07-03 DIAGNOSIS — Z7982 Long term (current) use of aspirin: Secondary | ICD-10-CM | POA: Insufficient documentation

## 2010-07-03 DIAGNOSIS — I10 Essential (primary) hypertension: Secondary | ICD-10-CM | POA: Insufficient documentation

## 2010-07-03 LAB — DIFFERENTIAL
Basophils Relative: 0 % (ref 0–1)
Eosinophils Relative: 0 % (ref 0–5)
Monocytes Absolute: 0.9 10*3/uL (ref 0.1–1.0)
Monocytes Relative: 10 % (ref 3–12)
Neutro Abs: 7.4 10*3/uL (ref 1.7–7.7)

## 2010-07-03 LAB — CBC
Hemoglobin: 10.1 g/dL — ABNORMAL LOW (ref 13.0–17.0)
MCH: 30.4 pg (ref 26.0–34.0)
MCHC: 33.9 g/dL (ref 30.0–36.0)
RDW: 14.1 % (ref 11.5–15.5)

## 2010-07-03 LAB — BASIC METABOLIC PANEL
CO2: 27 mEq/L (ref 19–32)
Calcium: 8.6 mg/dL (ref 8.4–10.5)
Chloride: 102 mEq/L (ref 96–112)
GFR calc Af Amer: >60 mL/min (ref >60)
Sodium: 134 mEq/L — ABNORMAL LOW (ref 135–145)

## 2010-07-03 LAB — PROTIME-INR: Prothrombin Time: 13.4 seconds (ref 11.6–15.2)

## 2010-07-03 NOTE — Telephone Encounter (Signed)
Patient seen in ED by Dr. Jens Som with right groin hematoma post cath on 06/28/10. U/S neg for pseudoaneurysm.  He needs CBC on Monday 3/26 and appt with me one day this week.

## 2010-07-04 ENCOUNTER — Ambulatory Visit: Payer: 59 | Admitting: Internal Medicine

## 2010-07-04 LAB — CORTISOL, URINE, FREE

## 2010-07-05 ENCOUNTER — Other Ambulatory Visit (INDEPENDENT_AMBULATORY_CARE_PROVIDER_SITE_OTHER): Payer: Medicare Other | Admitting: *Deleted

## 2010-07-05 DIAGNOSIS — M7981 Nontraumatic hematoma of soft tissue: Secondary | ICD-10-CM

## 2010-07-05 LAB — CBC WITH DIFFERENTIAL/PLATELET
Eosinophils Relative: 0.8 % (ref 0.0–5.0)
HCT: 28.6 % — ABNORMAL LOW (ref 39.0–52.0)
Hemoglobin: 9.9 g/dL — ABNORMAL LOW (ref 13.0–17.0)
Lymphocytes Relative: 11.9 % — ABNORMAL LOW (ref 12.0–46.0)
Lymphs Abs: 1 10*3/uL (ref 0.7–4.0)
Monocytes Relative: 7.1 % (ref 3.0–12.0)
Platelets: 257 10*3/uL (ref 150.0–400.0)
WBC: 8.5 10*3/uL (ref 4.5–10.5)

## 2010-07-05 NOTE — Telephone Encounter (Signed)
Follow up from this weekend. I cannot tell if patient has had a CBC yet.   Please make sure this is done and he has an appt set up this week.

## 2010-07-05 NOTE — Discharge Summary (Signed)
[name], [name]                 ACCOUNT NO.:  [id number]  MEDICAL RECORD NO.:  [id number]           PATIENT TYPE:  LOCATION:                                 FACILITY:  PHYSICIAN:  Stephen Nephew, MD       DATE OF BIRTH:  07-05-1930  DATE OF ADMISSION:  06/25/2010 DATE OF DISCHARGE:  06/28/2010                        DISCHARGE SUMMARY - REFERRING   PRIMARY CARE PHYSICIAN:  Stephen Ports A. Felicity Coyer, MD, [place].  CONSULTING [place] CARDIOLOGISTS:  Stephen Mixer, MD and Stephen Carrow, MD  DISCHARGE DIAGNOSES:  Chest pain with a history of CAD in CABG, resolved with status post PTCA of a drug-eluting stent in the ostium of the saphenous vein graft to the posterior descending artery. Dyspnea, resolved. Tachycardia. Palpitations. History of atrial fibrillation, not on Coumadin. Hypertension. History of coronary artery disease, CABG. Sleep apnea, refuses CPAP. History of asthma. History of peripheral vascular disease. History of hyperlipidemia. History of prior tobacco abuse.  DISCHARGE MEDICATIONS: 1. Aspirin 325 mg p.o. daily. 2. Bisoprolol 10 mg p.o. daily. 3. Plavix 75 mg p.o. daily. 4. Xopenex inhaler 2 puffs inhaled every 4 hours as needed. 5. Amlodipine 5 mg p.o. daily. 6. Budesonide nebulization 0.25 mg at one nebulization inhaled twice     daily. 7. Clonidine 0.1 mg 1 to 2 tablets by mouth twice daily. 8. Nitroglycerin 0.4 mg 1 tablet by mouth every 5 minutes as needed. 9. Potassium chloride 40 mEq p.o. daily. 10.Pravastatin 80 mg p.o. daily. 11.Prilosec 20 mg p.o. daily. 12.[employer] 1 tablet by mouth daily. 13.Spiriva 18 mcg inhaled daily. 14.Xalatan 0.005% ophthalmic 1 drop in both eyes daily. 15.Zyrtec 10 mg 1 tablet by mouth daily at bedtime. 16.Nuvigil 250 mg 1-1/2 tablet by mouth daily.  DISPOSITION:  The patient is discharged home.  The patient's diet is heart-healthy.  PROCEDURES PERFORMED:  The patient had two cardiac  catheterizations.  It looks like one by Dr. Kristeen Hickman, which showed significant coronary artery disease, he has a tight diagonal vessel that would be very difficult to reach because of a long left main stenosis.  I think the culprit lesion is the saphenous vein graft to the posterior descending artery.  We will proceed PTCA and stenting of this vessel.  The patient again had a cardiac catheterization on June 28, 2010, in which he had a successful PTCA with placement of drug-eluting stent, the ostium of the saphenous vein graft to the posterior descending artery.  The patient also had a CT angiogram of the chest in June 25, 2010, which showed no evidence of significant pulmonary embolism.  The patient also had 2-D echocardiogram on June 25, 2010, which showed left ventricular ejection fraction 55-60%, wall motion was normal.  There was no regional wall motion abnormalities.  DIET:  Heart-healthy.  FOLLOWUP:  The patient should follow up with Dr. Rene Hickman, [facility], within 1 week.  The patient should also have a BMET done in 1 week as the patient had some hypokalemia.  The patient should also follow up with Dr. Rollene Hickman in about 2 weeks.  The patient should also follow  up with Dr. Jetty Hickman at the regularly scheduled time.  Also, when the patient sees Dr. Antoine Hickman, should ask Dr. Antoine Hickman that whether or not the patient should be on anticoagulation or blood thinners or not for AFib, which was discussed with the patient and left in the pink sheet in the discharge instructions.  BRIEF HISTORY OF PRESENT ILLNESS:  This is a 77 year old male who came to the hospital with palpitations, dyspnea on exertion.  This is a 10- year-old male with history of labile hypertension, coronary artery disease status post CABG x5 vessels 3 years ago, history of sleep apnea, asthma, peripheral vascular disease, prior tobacco abuse, presented to the emergency room in  1-week with history of dyspnea and some frequent palpitations and some chest pain.  HOSPITAL COURSE: 1. Chest pain.  The patient was admitted to the hospital.  The patient     had cardiac enzymes cycled.  Cardiac enzymes were negative x3,     however, the patient was seen in consultation with [facility]     Cardiology.  The patient was placed on a heparin drip, as the     patient has a history of CAD, CABG.  Again, the patient did had a     cardiac catheterization, has successful placement of a PTCA stent. 2. Dyspnea.  Dyspnea is also a combination of unstable angina that has     since been resolved.  The patient was also switched over to     Xopenex. 3. Tachycardia palpitations history on bisoprolol.  The patient was     switched over metoprolol to bisoprolol because the patient has     history of significant asthma.  The patient did better once he was     on bisoprolol and again, the patient was switched over to Xopenex     also. 4. Hypertension.  The patient's blood pressure has been well     controlled.  Initially, it was elevated; however, the patient had     some rebound hypertension as a result of being off clonidine.  The     patient's antihypertensives were up-titrated. 5. Sleep apnea.  The patient refuses to be on CPAP; however, the     patient is taking Nuvigil. 6. Asthma.  Again, the patient is taking Xopenex as well as budesonide     inhaled corticosteroid. 7. Peripheral vascular disease.  The patient is on aspirin and now, he     is also on Plavix. 8. DVT prophylaxis.  The patient was initially on heparin, however,     the patient is ambulating now.  He is ready for discharge.  Also of     note, please note that the patient presented with hypokalemia, so     the patient will be discharged on potassium 40 mEq p.o. daily and     not 20 mEq such as home dose.     Stephen Nephew, MD     NH/MEDQ  D:  06/28/2010  T:  06/28/2010  Job:  [id number]  cc:   Stephen Ports A. Felicity Coyer,  MD Stephen Rotunda, MD, [facility]  Electronically Signed by Stephen Nephew MD on 07/05/2010 06:39:41 PM

## 2010-07-05 NOTE — Consult Note (Signed)
Hickman, Stephen                 ACCOUNT NO.:  [id number]  MEDICAL RECORD NO.:  [id number]           PATIENT TYPE:  E  LOCATION:  [facility]                         FACILITY:  [facility]  PHYSICIAN:  Madolyn Frieze. Jens Som, MD, FACCDATE OF BIRTH:  [birth year]  DATE OF CONSULTATION:  07/03/2010 DATE OF DISCHARGE:                                CONSULTATION   [name] is a 77 year old male with past medical history of coronary artery disease, status post coronary artery bypassing graft, history of atrial fibrillation, hypertension, obstructive sleep apnea, peripheral vascular disease, asthma, hyperlipidemia who I am asked to evaluate for groin hematoma.  The patient was recently admitted on June 25, 2010, for tachycardia, palpitations, hypertension, and dyspnea.  He ultimately underwent cardiac catheterization on June 27, 2010.  He was found to have significant coronary artery disease including an 80-90% stenosis at the ostium of the saphenous vein graft to the PDA.  On June 28, 2010, the patient had successful drug-eluting stent placement to the saphenous vein graft to the PDA.  The patient states that at the time of his procedure, he developed a hematoma.  He was discharged and he has had pain in the right groin area ever since.  The pain increases with ambulation.  Note, he denies any fevers or chills or back pain.  Over the last 2 days, he has noticed increased edema in the right lower extremity with none in the left lower extremity.  He was concerned and came to the emergency room for further evaluation after advice from a home health evaluation.  Note, the patient denies dyspnea, chest pain, palpitations, or syncope.  Again, he is having no back pain.  His medications at the time of discharge included aspirin 325 mg p.o. daily, bisoprolol 10 mg p.o. daily, Plavix 75 mg p.o. daily, Xopenex, amlodipine 5 mg p.o. daily nebulizers, clonidine 0.1 mg tablets 1-2 tablets by mouth twice daily,  sublingual nitroglycerin as needed, potassium chloride 4 mEq p.o. daily, Pravachol 80 mg daily, Prilosec 20 mg p.o. daily, [employer], Spiriva, eyedrops, Zyrtec, and Nuvigil.  He has an intolerance to CODEINE.  His past medical history is significant for hypertension and hyperlipidemia.  He also has a history of coronary artery disease, status post coronary artery bypassing graft as outlined in the HPI.  There is a history of asthma and obstructive sleep apnea.  There is a history of depression as well as alcoholism and a history of pneumonia.  He has had previous hernia surgery as well. His family history is significant for coronary artery disease.  SOCIAL HISTORY:  He does not smoke.  He does not consume alcohol.  REVIEW OF SYSTEMS:  He denies any headaches or fevers, chills.  There is no productive cough or hemoptysis.  There is no dysphagia, odynophagia, melena, or hematochezia.  There is no dysuria or hematuria.  There is no rash or seizure activity.  There is no orthopnea, PND, but there is edema in the right lower extremity.  There is no back pain.  Remaining systems are negative.  PHYSICAL EXAMINATION:  VITAL SIGNS:  Blood pressure 150/70,  pulse is 83. GENERAL:  He is well-developed, well-nourished distress. SKIN:  Warm and dry.  He does not appear to be depressed.  There is no peripheral clubbing.  His back is normal. HEENT:  Normal.  Normal eyelids. NECK:  Supple with a normal upstroke bilaterally.  No bruits noted. There is no jugular venous distention.  I cannot appreciate any thyromegaly. CHEST:  Clear to auscultation.  Normal expansion. CARDIOVASCULAR:  Regular rhythm.  Normal S1 and S2.  I cannot appreciate murmurs, rubs, or gallops. ABDOMEN:  Nontender and nondistended.  Positive bowel sounds.  No hepatosplenomegaly.  No mass appreciated.  His right groin is significantly ecchymotic and there is a small hematoma noted.  There is tenderness to palpation.   The ecchymosis tracks to his penis and scrotal area.  His right lower extremity shows 1+ ankle edema.  There is also edema in the thigh, but no cords are palpated.  He has 2+ dorsalis pedis pulse on the right. NEUROLOGIC:  Grossly intact.  LABORATORY DATA:  Sodium of 134, potassium of 3.9, BUN and creatinine of 12 and 0.77.  His INR is 1.00.  His PTT is 31.  His white blood cell count is 9.2 with a hemoglobin 10.1, hematocrit 29.8.  his platelet count is 223.  Note, his last CBC on July 01, 2010, at 4 o'clock in the morning was 12.4 and was prior to his PCI.  DIAGNOSES: 1. Right groin hematoma - the patient states that the hematoma is     actually stable, although there is persistent pain.  He had an     ultrasound in the emergency room that showed no pseudoaneurysm and     no AV fistula.  He is also not having back pain.  I do not think we     need to pursue further evaluation of the arterial side.  He does     have 1+ edema in the right lower extremity, and I am concerned     about the possible development of deep vein thrombosis.  We will     schedule a venous ultrasound to further evaluate.  If he does have     deep vein thrombosis, he will need to be admitted for     anticoagulation.  If there is no deep vein thrombosis, then we will     discharge home and I will plan to recheck his hemoglobin tomorrow     morning in the office.  He will need pain medications for his right     groin as well.  He will follow up with our PA next week for further     evaluation of his right groin. 2. Coronary artery disease, status post percutaneous coronary     intervention of the saphenous vein graft to the posterior     descending artery - he will continue on his present medications. 3. History of atrial fibrillation - the patient is not on Coumadin.     He is scheduled to discuss this with Dr. Antoine Poche at followup. 4. Hypertension - his blood pressure is controlled. 5. Hyperlipidemia -  continue statin.     Madolyn Frieze Jens Som, MD, [facility]     BSC/MEDQ  D:  07/03/2010  T:  07/04/2010  Job:  [id number]  Electronically Signed by Olga Millers MD [facility] on 07/05/2010 05:09:56 PM

## 2010-07-05 NOTE — Telephone Encounter (Signed)
I spoke with the patient's wife. She states that he had a cbc done today and advised her that he needs to see Tereso Newcomer PA on 3/29 at 12 noon.

## 2010-07-06 ENCOUNTER — Encounter: Payer: Self-pay | Admitting: Physician Assistant

## 2010-07-07 ENCOUNTER — Ambulatory Visit (INDEPENDENT_AMBULATORY_CARE_PROVIDER_SITE_OTHER): Payer: Medicare Other | Admitting: Physician Assistant

## 2010-07-07 ENCOUNTER — Encounter: Payer: Self-pay | Admitting: Physician Assistant

## 2010-07-07 ENCOUNTER — Telehealth: Payer: Self-pay | Admitting: *Deleted

## 2010-07-07 VITALS — BP 136/70 | Ht 63.0 in | Wt 154.8 lb

## 2010-07-07 DIAGNOSIS — T148XXA Other injury of unspecified body region, initial encounter: Secondary | ICD-10-CM

## 2010-07-07 DIAGNOSIS — I10 Essential (primary) hypertension: Secondary | ICD-10-CM

## 2010-07-07 DIAGNOSIS — I2510 Atherosclerotic heart disease of native coronary artery without angina pectoris: Secondary | ICD-10-CM

## 2010-07-07 DIAGNOSIS — J45909 Unspecified asthma, uncomplicated: Secondary | ICD-10-CM

## 2010-07-07 DIAGNOSIS — I4891 Unspecified atrial fibrillation: Secondary | ICD-10-CM

## 2010-07-07 MED ORDER — CLOPIDOGREL BISULFATE 75 MG PO TABS: 75.0000 mg | ORAL_TABLET | Freq: Every day | ORAL | Status: DC

## 2010-07-07 MED ORDER — HYDROCODONE-ACETAMINOPHEN 5-500 MG PO TABS: 1.0000 | ORAL_TABLET | Freq: Three times a day (TID) | ORAL | Status: DC | PRN

## 2010-07-07 MED ORDER — POTASSIUM CHLORIDE CRYS ER 20 MEQ PO TBCR: 20.0000 meq | EXTENDED_RELEASE_TABLET | Freq: Two times a day (BID) | ORAL | Status: DC

## 2010-07-07 MED ORDER — BISOPROLOL FUMARATE 10 MG PO TABS: 10.0000 mg | ORAL_TABLET | Freq: Every day | ORAL | Status: DC

## 2010-07-07 NOTE — Assessment & Plan Note (Signed)
Stable without angina.  Continue asa and plavix.

## 2010-07-07 NOTE — Assessment & Plan Note (Signed)
He was switched to xopenex in the hospital.  This is too expensive.  I told him to go back on albuterol and follow up with pulmonology.

## 2010-07-07 NOTE — Patient Instructions (Signed)
.  Your physician recommends that you schedule a follow-up appointment in:  1 WEEK WITH SCOTT WEAVER, PA-C  YOU HAVE BEEN GIVEN A PRESCRIPTION FOR VICODIN 5/500 TAKE 1 TABLET EVERY 8 HOURS ONLY AS NEEDED

## 2010-07-07 NOTE — Telephone Encounter (Signed)
pt aware of lab results  

## 2010-07-07 NOTE — Assessment & Plan Note (Addendum)
There was a question of AFib at one point in the hospital.  He actually had a WCT.  EF was normal and it is possible this was AFib with aberrancy.  I cannot find any strips to review and his ECGs were all NSR.  He is now maintaining NSR.  He is on plavix and ASA.  I suggest we eventually set him up with a monitor to see if he is having any Afib.  If this is negative, continue on current therapy.  If this is significant for parox. AFib we will need to decide whether or not he should be on coumadin plus dual antiplatelet therapy.  That would not be a good idea currently with his significant hematoma.

## 2010-07-07 NOTE — Assessment & Plan Note (Signed)
Controlled.  Continue current therapy.  

## 2010-07-07 NOTE — Assessment & Plan Note (Signed)
Overall, I believe his  Hematoma is stable without change.  His hemoglobin was stable 2 days ago.  I have given her a prescription for Vicodin 5/500 one tablet every 8 hours p.r.n.  He is to use this only as needed.  He should keep his leg wrapped up.  He should try to increase his ambulation as much as possible but not over do it.  He has an appointment with me next week and he should keep that appointment.  I told him to watch for significant changes and pain in his scrotum or penis.  If this should happen, he should go to the emergency room.

## 2010-07-07 NOTE — Progress Notes (Signed)
History of Present Illness: Primary Cardiologist:  Dr. Rollene Rotunda  [name] is a 77 y.o. male with a h/o CAD, s/p CABG.  He was admitted with unstable angina and underwent DES to the S-PDA on 3/19.  S-Dx was occluded and the S-OM and L-LAD were both patent.  His has normal LVF.  He presented back to the hospital ED on 3/25 with a right groin hematoma.  U/S was neg for AV fistula or pseudoaneurysm.  Venous was neg for DVT.  He was seen by Dr. Jens Som.  Follow up CBC earlier this week was stable.  He returns for recheck on his groin.  He is still having a lot of pain.  He does not feel like it is worse.  He denies chest pain or dyspnea or syncope.  He denies orthopnea or pnd.  His edema is the same or a little better.  Past Medical History  Diagnosis Date  . DYSLIPIDEMIA 10/04/2009  . HYPERTENSION 05/10/2007    Echo 3/12: EF 55-60%; mod LVH; mild AS/AI; LAE; PASP 38; mild pulmo HTN  . CORONARY HEART DISEASE 05/10/2007    a. s/p CABG;  b.  cath 06/27/10: S-Dx occluded, S-PDA 80-90% (tx with PCI); S-OM ok, L-LAD ok;  EF 65-70%  c.  s/p Promus DES to S-PDA 06/2010  . FIBRILLATION, ATRIAL 10/04/2009    post op; ?documented during hosp. 06/2010  . ALLERGIC RHINITIS 05/10/2007  . ASTHMA 05/10/2007  . GERD 11/19/2009  . SLEEP APNEA 05/10/2007  . Depression   . Personal history of alcoholism     Current Outpatient Prescriptions  Medication Sig Dispense Refill  . amLODipine (NORVASC) 5 MG tablet Take 5 mg by mouth 2 (two) times daily.       . Armodafinil (NUVIGIL) 250 MG tablet Take 250 mg by mouth daily. 1/2 tablet       . aspirin 325 MG tablet Take 325 mg by mouth daily.        . bisoprolol (ZEBETA) 10 MG tablet Take 1 tablet (10 mg total) by mouth daily.  30 tablet  11  . budesonide (PULMICORT) 0.25 MG/2ML nebulizer solution Take 0.25 mg by nebulization 2 (two) times daily.        . cetirizine (ZYRTEC) 10 MG tablet Take 10 mg by mouth daily.        . cloNIDine (CATAPRES) 0.1 MG tablet Take 0.1  mg by mouth 3 (three) times daily.       . clopidogrel (PLAVIX) 75 MG tablet Take 1 tablet (75 mg total) by mouth daily.  30 tablet  11  . latanoprost (XALATAN) 0.005 % ophthalmic solution Place 1 drop into both eyes daily.        . nitroGLYCERIN (NITROSTAT) 0.4 MG SL tablet Place 0.4 mg under the tongue every 5 (five) minutes as needed.        . NON FORMULARY Allergy Injection every Friday       . omeprazole (PRILOSEC) 20 MG capsule Take 20 mg by mouth daily.        . potassium chloride SA (K-DUR,KLOR-CON) 20 MEQ tablet Take 1 tablet (20 mEq total) by mouth 2 (two) times daily.  60 tablet  11  . pravastatin (PRAVACHOL) 80 MG tablet Take 80 mg by mouth daily.        . Probiotic Product (PHILLIPS COLON HEALTH PO) Take by mouth daily.        Marland Kitchen tiotropium (SPIRIVA) 18 MCG inhalation capsule Place 18 mcg into inhaler  and inhale daily.        [name] 1.25 MG/3ML nebulizer solution       . XOPENEX HFA 45 MCG/ACT inhaler       . DISCONTD: albuterol (PROAIR HFA) 108 (90 BASE) MCG/ACT inhaler Inhale 2 puffs into the lungs every 4 (four) hours as needed.        Marland Kitchen DISCONTD: bisoprolol (ZEBETA) 10 MG tablet       . DISCONTD: PLAVIX 75 MG tablet       . DISCONTD: potassium chloride SA (K-DUR,KLOR-CON) 20 MEQ tablet Take 20 mEq by mouth 2 (two) times daily.       Marland Kitchen dextromethorphan-guaiFENesin (MUCINEX DM) 30-600 MG per 12 hr tablet Take 1 tablet by mouth every 12 (twelve) hours.        Marland Kitchen HYDROcodone-acetaminophen (VICODIN) 5-500 MG per tablet Take 1 tablet by mouth every 8 (eight) hours as needed for pain (1 TABLET AS NEEDED FOR PAIN AT BRUISE SITE).  20 tablet  0  . DISCONTD: albuterol (PROVENTIL) (2.5 MG/3ML) 0.083% nebulizer solution Take 2.5 mg by nebulization 2 (two) times daily.        Marland Kitchen DISCONTD: aspirin 81 MG tablet Take 81 mg by mouth daily.        Marland Kitchen DISCONTD: modafinil (PROVIGIL) 100 MG tablet Take 100 mg by mouth daily.        Marland Kitchen DISCONTD: pravastatin (PRAVACHOL) 20 MG tablet Take 20 mg by mouth  daily.       Marland Kitchen DISCONTD: valsartan-hydrochlorothiazide (DIOVAN-HCT) 160-12.5 MG per tablet Take 1 tablet by mouth daily.          Allergies  Allergen Reactions  . Codeine     REACTION: intolerant    Vital Signs: BP 136/70  Ht 5\' 3"  (1.6 m)  Wt 154 lb 12.8 oz (70.217 kg)  BMI 27.42 kg/m2  PHYSICAL EXAM: Well nourished, well developed, in no acute distress HEENT: normal Neck: no JVD Cardiac:  normal S1, S2; RRR; no murmur Lungs:  clear to auscultation bilaterally, no wheezing, rhonchi or rales Abd: soft, nontender, no hepatomegaly Ext: mod sized hematoma right groin ("racket ball size"); very tender; wide spread tracking ecchymoses to RLQ and buttocks, to a greater extent to the scrotum and penis; GU: foreskin easily retracts; testicles in scrotum and mobile Skin: warm and dry Neuro:  CNs 2-12 intact, no focal abnormalities noted  EKG:  Normal sinus rhythm and, heart rate 79, normal axis, poor R-wave progression, no ischemic changes, RSR prime in V1  ASSESSMENT AND PLAN:

## 2010-07-07 NOTE — Telephone Encounter (Signed)
Message copied by Deliah Goody on Thu Jul 07, 2010  5:28 PM ------      Message from: Olga Millers      Created: Tue Jul 05, 2010  5:14 PM       ok

## 2010-07-10 HISTORY — PX: CORONARY ANGIOPLASTY WITH STENT PLACEMENT: SHX49

## 2010-07-11 ENCOUNTER — Encounter: Payer: Self-pay | Admitting: Internal Medicine

## 2010-07-11 ENCOUNTER — Other Ambulatory Visit (INDEPENDENT_AMBULATORY_CARE_PROVIDER_SITE_OTHER): Payer: Medicare Other

## 2010-07-11 ENCOUNTER — Ambulatory Visit (INDEPENDENT_AMBULATORY_CARE_PROVIDER_SITE_OTHER): Payer: Medicare Other | Admitting: Internal Medicine

## 2010-07-11 DIAGNOSIS — E876 Hypokalemia: Secondary | ICD-10-CM | POA: Insufficient documentation

## 2010-07-11 DIAGNOSIS — T148XXA Other injury of unspecified body region, initial encounter: Secondary | ICD-10-CM

## 2010-07-11 DIAGNOSIS — I2510 Atherosclerotic heart disease of native coronary artery without angina pectoris: Secondary | ICD-10-CM

## 2010-07-11 DIAGNOSIS — I10 Essential (primary) hypertension: Secondary | ICD-10-CM

## 2010-07-11 LAB — CBC WITH DIFFERENTIAL/PLATELET
Basophils Relative: 0.4 % (ref 0.0–3.0)
Eosinophils Relative: 1.6 % (ref 0.0–5.0)
HCT: 28.4 % — ABNORMAL LOW (ref 39.0–52.0)
Hemoglobin: 9.9 g/dL — ABNORMAL LOW (ref 13.0–17.0)
Lymphs Abs: 1.1 10*3/uL (ref 0.7–4.0)
MCV: 90.4 fl (ref 78.0–100.0)
Monocytes Absolute: 0.5 10*3/uL (ref 0.1–1.0)
Neutro Abs: 4.9 10*3/uL (ref 1.4–7.7)
Platelets: 335 10*3/uL (ref 150.0–400.0)
WBC: 6.7 10*3/uL (ref 4.5–10.5)

## 2010-07-11 LAB — POTASSIUM: Potassium: 4 mEq/L (ref 3.5–5.1)

## 2010-07-11 NOTE — Patient Instructions (Signed)
It was good to see you today. Medications reviewed, no changes at this time. Test(s) ordered today. Your results will be called to you after review (48-72hours after test completion). If any changes need to be made, you will be notified at that time. Please reschedule followup in 3 months, call sooner if problems.

## 2010-07-11 NOTE — Assessment & Plan Note (Signed)
The current medical regimen is effective;  continue present plan and medications. BP Readings from Last 3 Encounters:  07/11/10 112/62  07/07/10 136/70  06/15/10 120/62

## 2010-07-11 NOTE — Progress Notes (Signed)
Subjective:    Patient ID: [name], male    DOB: 01-07-1931, 77 y.o.   MRN: [id number]  HPI  here for hospital follow up- here with wife who provdes much of the hx DES to saph v graft on 3/201/12 - course complicated by hematoma, no DVT on ER f/u for pain and swelling Uses tylenol as needed for pain symptoms  Concern for anemia related to hematoma and hypokalemia as treated in hosp for same  Also reviewed other med issues today: 1) CAD s/p CABG 2008 - follows with cards for same - s/p treadmill stress test in 11/2009 - -reports compliance with ongoing medical treatment and no changes in medication dose or frequency. denies adverse side effects related to current therapy. no CP or anginal symptoms   2) HTN - reports compliance with ongoing medical treatment and no changes in medication dose or frequency. denies adverse side effects related to current therapy. no edema or HA or weakness  3) dyslipidemia - changed from simva to prava 6/2011due to FDA warnings - reports compliance with ongoing medical treatment and no other changes in medication dose or frequency. denies adverse side effects related to current therapy. no muscle pain or weakness (prior to hematoma)  4) asthma, remote tobacco - follows with pulm for same - frequent flares related to weather change - reports compliance with ongoing nubulizer treatment and no changes in medication dose or frequency. denies adverse side effects related to current therapy.   Past Medical History  Diagnosis Date  . DYSLIPIDEMIA 10/04/2009  . HYPERTENSION 05/10/2007    Echo 3/12: EF 55-60%; mod LVH; mild AS/AI; LAE; PASP 38; mild pulmo HTN  . CORONARY HEART DISEASE 05/10/2007    a. s/p CABG;  b.  cath 06/27/10: S-Dx occluded, S-PDA 80-90% (tx with PCI); S-OM ok, L-LAD ok;  EF 65-70%  c.  s/p Promus DES to S-PDA 06/2010  . FIBRILLATION, ATRIAL 10/04/2009    post op; ?documented during hosp. 06/2010  . ALLERGIC RHINITIS 05/10/2007  . ASTHMA 05/10/2007   . GERD 11/19/2009  . SLEEP APNEA 05/10/2007  . Depression   . Personal history of alcoholism     Review of Systems  Constitutional: Negative for fever.  Respiratory: Negative for cough.   Cardiovascular: Negative for chest pain.  Genitourinary: Negative for scrotal swelling.  Neurological: Negative for dizziness, weakness, numbness and headaches.      Objective:   Physical Exam BP 112/62  Pulse 72  Temp(Src) 98.4 F (36.9 C) (Oral)  Ht 5\' 2"  (1.575 m)  Wt 157 lb 6.4 oz (71.396 kg)  BMI 28.79 kg/m2  Physical Exam  Constitutional:  oriented to person, place, and time. appears well-developed and well-nourished. No distress. Wife at side Neck: Normal range of motion. Neck supple. No JVD present. No thyromegaly present.  Cardiovascular: Normal rate, irregular rhythm and normal heart sounds.  No murmur heard. Pulmonary/Chest: Effort normal and breath sounds normal. No respiratory distress. no wheezes.  Abdominal: Soft. Bowel sounds are normal. Patient exhibits no distension. There is no tenderness.  Musculoskeletal: Normal range of motion. Patient exhibits no edema.  hematoma of R groin with thigh bruising (improved per wife) Neurological: he is alert and oriented to person, place, and time. No cranial nerve deficit. Coordination normal.  Skin: Skin is warm and dry.  No erythema or ulceration.  Psychiatric: he has a normal mood and affect. behavior is normal. Judgment and thought content normal.     Lab Results  Component Value Date  WBC 8.5 07/05/2010   HGB 9.9* 07/05/2010   HCT 28.6* 07/05/2010   PLT 257.0 07/05/2010   CHOL  Value: 113        ATP III CLASSIFICATION:  <200     mg/dL   Desirable  604-540  mg/dL   Borderline High  >=981    mg/dL   High        1/91/4782   TRIG 35 06/25/2010   HDL 39* 06/25/2010   ALT 23 11/29/2009   AST 22 11/29/2009   NA 134* 07/03/2010   K 3.9 07/03/2010   CL 102 07/03/2010   CREATININE 0.77 07/03/2010   BUN 12 07/03/2010   CO2 27 07/03/2010   TSH  1.718 06/25/2010   INR 1.00 07/03/2010   Assessment & Plan:  See problem list. Medications and labs reviewed today.

## 2010-07-11 NOTE — Assessment & Plan Note (Signed)
Hematoma R groin following cath - stable without change as per cards - recheck Hgb now and control pain with tylenol as needed

## 2010-07-11 NOTE — Assessment & Plan Note (Signed)
DES 06/28/10 reviewed - The current medical regimen is effective;  continue present plan and medications.  Follow up with cards this week as planned

## 2010-07-12 ENCOUNTER — Telehealth: Payer: Self-pay | Admitting: Internal Medicine

## 2010-07-12 NOTE — Telephone Encounter (Signed)
Called pt no ansew LMOM RTC concerning labs...07/12/10@11 :38am/LMB

## 2010-07-12 NOTE — Telephone Encounter (Signed)
Please call patient - normal results. No medication changes recommended. Thanks.   Lab Results  Component Value Date   K 4.0 07/11/2010   Lab Results  Component Value Date   WBC 6.7 07/11/2010   HGB 9.9* 07/11/2010   HCT 28.4* 07/11/2010   MCV 90.4 07/11/2010   PLT 335.0 07/11/2010

## 2010-07-12 NOTE — Telephone Encounter (Signed)
Spoke w/Pt's wife and relayed information of results.

## 2010-07-13 ENCOUNTER — Encounter: Payer: Self-pay | Admitting: Physician Assistant

## 2010-07-13 ENCOUNTER — Ambulatory Visit (INDEPENDENT_AMBULATORY_CARE_PROVIDER_SITE_OTHER): Payer: Medicare Other | Admitting: Physician Assistant

## 2010-07-13 VITALS — BP 128/62 | HR 64 | Ht 63.0 in | Wt 156.1 lb

## 2010-07-13 DIAGNOSIS — T148XXA Other injury of unspecified body region, initial encounter: Secondary | ICD-10-CM

## 2010-07-13 DIAGNOSIS — I4891 Unspecified atrial fibrillation: Secondary | ICD-10-CM

## 2010-07-13 DIAGNOSIS — I2510 Atherosclerotic heart disease of native coronary artery without angina pectoris: Secondary | ICD-10-CM

## 2010-07-13 MED ORDER — NITROGLYCERIN 0.4 MG SL SUBL: 0.4000 mg | SUBLINGUAL_TABLET | SUBLINGUAL | Status: DC | PRN

## 2010-07-13 NOTE — Patient Instructions (Signed)
Your physician recommends that you schedule a follow-up appointment in: 6 weeks with Dr. Antoine Poche as per Tereso Newcomer, PA-C  You have been referred to CARDIAC REHAB, YOU MAY START THIS ON Monday 07/18/10 DX 414.01 CAD  Your physician recommends that you continue on your current medications as directed. Please refer to the Current Medication list given to you today.

## 2010-07-13 NOTE — Assessment & Plan Note (Addendum)
Overall stable.  I think this is improving.  His pain is improving.  His hemoglobin is stable.  He can continue to increase his activity slowly.  He will notify us if his pain or swelling worsens.   WBC  Date Value Range Status  07/11/2010 6.7  4.5-10.5 (K/uL) Final     RBC  Date Value Range Status  07/11/2010 3.14* 4.22-5.81 (Mil/uL) Final     Hemoglobin  Date Value Range Status  07/11/2010 9.9* 13.0-17.0 (g/dL) Final     Hematocrit  Date Value Range Status  07/11/2010 28.4* 39.0-52.0 (%) Final     MCV  Date Value Range Status  07/11/2010 90.4  78.0-100.0 (fl) Final

## 2010-07-13 NOTE — Assessment & Plan Note (Signed)
Stable.  No angina.  Continue aspirin and Plavix.  He can decrease his aspirin to 81 mg a day.  He is having significant bruising.

## 2010-07-13 NOTE — Assessment & Plan Note (Signed)
I believe this was in the context of his unstable angina.  He has been maintaining normal sinus rhythm.  He is currently on dual antiplatelet therapy.  At this point, I do not think he needs to undergo Holter monitoring.

## 2010-07-13 NOTE — Progress Notes (Signed)
History of Present Illness: Primary Cardiologist:  Dr. Rollene Rotunda  [name] is a 77 y.o. male with a h/o CAD, s/p CABG.  He was admitted with unstable angina and underwent DES to the S-PDA on 3/19.  S-Dx was occluded and the S-OM and L-LAD were both patent.  His has normal LVF.  He presented back to the hospital ED on 3/25 with a right groin hematoma.  U/S was neg for AV fistula or pseudoaneurysm.  Venous was neg for DVT.  He was seen by Dr. Jens Som.  Follow up CBC was stable.  I saw him last week for follow up on his groin hematoma.  This seemed to be stable.  He returns for follow up.  He denies chest pain, shortness of breath, syncope.  He denies palpitations.  His right groin pain is slowly improving.  The swelling is slowly improving.  He notes increased swelling and pain especially at the end of the day.  He is eager to get back to regular activity.  He is also eager to start cardiac rehabilitation.  He had a CBC drawn with his PCP yesterday and his hemoglobin is stable at 9.9.  Past Medical History  Diagnosis Date  . DYSLIPIDEMIA 10/04/2009  . HYPERTENSION 05/10/2007    Echo 3/12: EF 55-60%; mod LVH; mild AS/AI; LAE; PASP 38; mild pulmo HTN  . CORONARY HEART DISEASE 05/10/2007    a. s/p CABG;  b.  cath 06/27/10: S-Dx occluded, S-PDA 80-90% (tx with PCI); S-OM ok, L-LAD ok;  EF 65-70%  c.  s/p Promus DES to S-PDA 06/2010  . FIBRILLATION, ATRIAL 10/04/2009    post op; ?documented during hosp. 06/2010  . ALLERGIC RHINITIS 05/10/2007  . ASTHMA 05/10/2007  . GERD 11/19/2009  . SLEEP APNEA 05/10/2007  . Depression   . Personal history of alcoholism     Current Outpatient Prescriptions  Medication Sig Dispense Refill  . amLODipine (NORVASC) 5 MG tablet Take 5 mg by mouth 2 (two) times daily.       . Armodafinil (NUVIGIL) 250 MG tablet Take 250 mg by mouth daily. 1/2 tablet       . aspirin 325 MG tablet Take 325 mg by mouth daily.        . bisoprolol (ZEBETA) 10 MG tablet Take 1 tablet (10  mg total) by mouth daily.  30 tablet  11  . budesonide (PULMICORT) 0.25 MG/2ML nebulizer solution Take 0.25 mg by nebulization 2 (two) times daily.        . cetirizine (ZYRTEC) 10 MG tablet Take 10 mg by mouth daily.        . cloNIDine (CATAPRES) 0.1 MG tablet Take 0.1 mg by mouth 3 (three) times daily.       . clopidogrel (PLAVIX) 75 MG tablet Take 1 tablet (75 mg total) by mouth daily.  30 tablet  11  . dextromethorphan-guaiFENesin (MUCINEX DM) 30-600 MG per 12 hr tablet Take 1 tablet by mouth every 12 (twelve) hours.        Marland Kitchen HYDROcodone-acetaminophen (VICODIN) 5-500 MG per tablet Take 1 tablet by mouth every 8 (eight) hours as needed for pain (1 TABLET AS NEEDED FOR PAIN AT BRUISE SITE).  20 tablet  0  . latanoprost (XALATAN) 0.005 % ophthalmic solution Place 1 drop into both eyes daily.        . nitroGLYCERIN (NITROSTAT) 0.4 MG SL tablet Place 0.4 mg under the tongue every 5 (five) minutes as needed.        Marland Kitchen  NON FORMULARY Allergy Injection every Friday       . omeprazole (PRILOSEC) 20 MG capsule Take 20 mg by mouth daily.        . potassium chloride SA (K-DUR,KLOR-CON) 20 MEQ tablet Take 1 tablet (20 mEq total) by mouth 2 (two) times daily.  60 tablet  11  . pravastatin (PRAVACHOL) 80 MG tablet Take 80 mg by mouth daily.        . Probiotic Product (PHILLIPS COLON HEALTH PO) Take by mouth daily.        Marland Kitchen tiotropium (SPIRIVA) 18 MCG inhalation capsule Place 18 mcg into inhaler and inhale daily.        Marland Kitchen albuterol (PROVENTIL) (2.5 MG/3ML) 0.083% nebulizer solution Take 2.5 mg by nebulization 2 (two) times daily.          Allergies  Allergen Reactions  . Codeine     REACTION: intolerant    Vital Signs: BP 128/62  Pulse 64  Ht 5\' 3"  (1.6 m)  Wt 156 lb 1.9 oz (70.816 kg)  BMI 27.66 kg/m2  PHYSICAL EXAM: Well nourished, well developed, in no acute distress HEENT: normal Neck: no JVD Cardiac:  normal S1, S2; RRR; no murmur Lungs:  clear to auscultation bilaterally, no wheezing,  rhonchi or rales Abd: soft, nontender, no hepatomegaly Ext: Right groin hematoma improved in size, non-tender; ecchymosis extending over to his penis is diminishing, scrotal ecchymosis is resolved Skin: warm and dry Neuro:  CNs 2-12 intact, no focal abnormalities noted  ASSESSMENT AND PLAN:

## 2010-07-19 NOTE — Procedures (Signed)
  NAMERUBENS, CRANSTON                 ACCOUNT NO.:  [id number]  MEDICAL RECORD NO.:  [id number]           PATIENT TYPE:  I  LOCATION:  2807                         FACILITY:  [facility]  PHYSICIAN:  Vesta Mixer, M.D. DATE OF BIRTH:  [birth year]  DATE OF PROCEDURE:  06/27/2010 DATE OF DISCHARGE:                           CARDIAC CATHETERIZATION   [name] is a 77 year old gentleman with a history of coronary artery disease.  He has a history of coronary artery bypass grafting.  He presented to the hospital with episodes of chest pain.  He was ruled out for myocardial infarction and is referred for heart catheterization.  The right femoral artery was easily cannulated using modified Seldinger technique.  HEMODYNAMICS:  LV pressure is 173/17.  The aortic pressure is 155/74.  ANGIOGRAPHY:  Left main:  The left main is moderately to severely calcified.  There is a long 50% stenosis throughout the course of the left main.  The left anterior descending artery is also very heavily calcified.  The proximal vessel has only minor irregularities.  The mid vessel has a hazy 70-80% stenosis just after giving off the large diagonal vessel.  The first diagonal artery is a moderate-to-large vessel.  There is an 80% stenosis at its origin.  The graft to this vessel is occluded. There are minor luminal irregularities at the diagonal vessel.  The left circumflex artery is a fairly large vessel.  There are moderate irregularities.  The vessel has significant competitive flow from the saphenous vein graft and barely fills from the native injections.  The right coronary artery is very large.  There is mild-to-moderate disease diffusely.  It is occluded at its distal-most point.  The saphenous vein graft to the posterolateral branch has a 80-90% stenosis at its ostium.  This graft is fairly small.  The saphenous vein graft to the obtuse marginal artery is very large graft.  It is normal.  The  anastomosis is normal.  The saphenous vein graft to the diagonal artery is occluded.  The left internal mammary artery to LAD is a normal graft.  There is some filling from the LAD seen quite well.  The left ventriculogram was performed in the 30 RAO position.  It reveals normal left ventricular systolic function.  Ejection fraction is 65-70%.  COMPLICATIONS:  None.  CONCLUSION:  Significant coronary artery disease.  He has a tight diagonal vessel that would be very difficult to reach because of the long left main stenosis.  I think that his culprit lesion is the saphenous vein graft to his posterior descending artery.  We will proceed with PTCA and stenting of that vessel.  If he continues to have angina, we should consider PCI of the first diagonal artery.     Vesta Mixer, M.D.     PJN/MEDQ  D:  06/27/2010  T:  06/28/2010  Job:  [id number]  cc:   Rollene Rotunda, MD, [facility]  Electronically Signed by Kristeen Miss M.D. on 07/19/2010 09:08:19 AM

## 2010-07-25 ENCOUNTER — Encounter (HOSPITAL_COMMUNITY): Payer: Medicare Other

## 2010-07-25 ENCOUNTER — Encounter (HOSPITAL_COMMUNITY): Payer: Medicare Other | Attending: Cardiology

## 2010-07-25 DIAGNOSIS — I2510 Atherosclerotic heart disease of native coronary artery without angina pectoris: Secondary | ICD-10-CM | POA: Insufficient documentation

## 2010-07-25 DIAGNOSIS — Z5189 Encounter for other specified aftercare: Secondary | ICD-10-CM | POA: Insufficient documentation

## 2010-07-25 DIAGNOSIS — J45909 Unspecified asthma, uncomplicated: Secondary | ICD-10-CM | POA: Insufficient documentation

## 2010-07-25 DIAGNOSIS — Z951 Presence of aortocoronary bypass graft: Secondary | ICD-10-CM | POA: Insufficient documentation

## 2010-07-25 DIAGNOSIS — Z7982 Long term (current) use of aspirin: Secondary | ICD-10-CM | POA: Insufficient documentation

## 2010-07-25 DIAGNOSIS — R0789 Other chest pain: Secondary | ICD-10-CM | POA: Insufficient documentation

## 2010-07-25 DIAGNOSIS — Z9861 Coronary angioplasty status: Secondary | ICD-10-CM | POA: Insufficient documentation

## 2010-07-25 DIAGNOSIS — Z7902 Long term (current) use of antithrombotics/antiplatelets: Secondary | ICD-10-CM | POA: Insufficient documentation

## 2010-07-25 DIAGNOSIS — R000 Tachycardia, unspecified: Secondary | ICD-10-CM | POA: Insufficient documentation

## 2010-07-25 DIAGNOSIS — I739 Peripheral vascular disease, unspecified: Secondary | ICD-10-CM | POA: Insufficient documentation

## 2010-07-26 ENCOUNTER — Telehealth: Payer: Self-pay | Admitting: Cardiology

## 2010-07-27 ENCOUNTER — Encounter (HOSPITAL_COMMUNITY): Payer: Medicare Other

## 2010-07-28 NOTE — Telephone Encounter (Signed)
Per wife - pt has been having swelling in his feet and ankles with left leg being worse that the right.  Pt had been instructed to obtain and wear compression stockings but he  just got the compression stockings today so they do not know if they are helping yet or not.  Instructed wife to have pt wear stockings throughout the weekend, decrease NA intake and elevate feet as much as possible when sitting.  Wife states understanding and will call back on Monday if no improvement.

## 2010-07-29 ENCOUNTER — Encounter (HOSPITAL_COMMUNITY): Payer: Medicare Other

## 2010-08-01 ENCOUNTER — Encounter (HOSPITAL_COMMUNITY): Payer: Medicare Other

## 2010-08-01 ENCOUNTER — Ambulatory Visit: Payer: 59 | Admitting: Internal Medicine

## 2010-08-03 ENCOUNTER — Encounter (HOSPITAL_COMMUNITY): Payer: Medicare Other

## 2010-08-05 ENCOUNTER — Encounter (HOSPITAL_COMMUNITY): Payer: Medicare Other

## 2010-08-08 ENCOUNTER — Encounter (HOSPITAL_COMMUNITY): Payer: Medicare Other

## 2010-08-09 ENCOUNTER — Ambulatory Visit (INDEPENDENT_AMBULATORY_CARE_PROVIDER_SITE_OTHER): Payer: Medicare Other

## 2010-08-09 DIAGNOSIS — J309 Allergic rhinitis, unspecified: Secondary | ICD-10-CM

## 2010-08-10 ENCOUNTER — Ambulatory Visit: Payer: Medicare Other

## 2010-08-10 ENCOUNTER — Encounter (HOSPITAL_COMMUNITY): Payer: Medicare Other | Attending: Cardiology

## 2010-08-10 DIAGNOSIS — R000 Tachycardia, unspecified: Secondary | ICD-10-CM | POA: Insufficient documentation

## 2010-08-10 DIAGNOSIS — Z7902 Long term (current) use of antithrombotics/antiplatelets: Secondary | ICD-10-CM | POA: Insufficient documentation

## 2010-08-10 DIAGNOSIS — J45909 Unspecified asthma, uncomplicated: Secondary | ICD-10-CM | POA: Insufficient documentation

## 2010-08-10 DIAGNOSIS — I739 Peripheral vascular disease, unspecified: Secondary | ICD-10-CM | POA: Insufficient documentation

## 2010-08-10 DIAGNOSIS — Z951 Presence of aortocoronary bypass graft: Secondary | ICD-10-CM | POA: Insufficient documentation

## 2010-08-10 DIAGNOSIS — R0789 Other chest pain: Secondary | ICD-10-CM | POA: Insufficient documentation

## 2010-08-10 DIAGNOSIS — Z5189 Encounter for other specified aftercare: Secondary | ICD-10-CM | POA: Insufficient documentation

## 2010-08-10 DIAGNOSIS — I2510 Atherosclerotic heart disease of native coronary artery without angina pectoris: Secondary | ICD-10-CM | POA: Insufficient documentation

## 2010-08-10 DIAGNOSIS — Z9861 Coronary angioplasty status: Secondary | ICD-10-CM | POA: Insufficient documentation

## 2010-08-10 DIAGNOSIS — Z7982 Long term (current) use of aspirin: Secondary | ICD-10-CM | POA: Insufficient documentation

## 2010-08-12 ENCOUNTER — Encounter (HOSPITAL_COMMUNITY): Payer: Medicare Other

## 2010-08-15 ENCOUNTER — Encounter (HOSPITAL_COMMUNITY): Payer: Medicare Other

## 2010-08-17 ENCOUNTER — Encounter (HOSPITAL_COMMUNITY): Payer: Medicare Other

## 2010-08-19 ENCOUNTER — Encounter (HOSPITAL_COMMUNITY): Payer: Medicare Other

## 2010-08-22 ENCOUNTER — Encounter (HOSPITAL_COMMUNITY): Payer: Medicare Other

## 2010-08-23 NOTE — Op Note (Signed)
NAMECOLESTON, DIROSA                 ACCOUNT NO.:  [id number]   MEDICAL RECORD NO.:  [id number]          PATIENT TYPE:  INP   LOCATION:  2305                         FACILITY:  [facility]   PHYSICIAN:  Kerin Perna, M.D.  DATE OF BIRTH:  1931/03/29   DATE OF PROCEDURE:  02/28/2007  DATE OF DISCHARGE:                               OPERATIVE REPORT   OPERATION:  1. Coronary artery bypass grafting x5 (left internal mammary artery to      the left anterior descending, saphenous vein graft to diagonal,      sequential saphenous vein graft to posterior descending and      posterior lateral, saphenous vein graft to obtuse marginal).  2. Endoscopic vein harvest of the right leg greater saphenous vein.   SURGEON:  Kerin Perna, M.D.   ASSISTANT:  Danelle Earthly, PA-C   ANESTHESIA:  General.   PRE AND POSTOPERATIVE DIAGNOSIS:  Severe three-vessel coronary disease  with class III angina.   INDICATIONS:  The patient is a 77 year old hypertensive male with  chronic lung disease who presents with exertional dyspnea, weakness and  chest tightness.  A cardiac catheterization performed by his  cardiologist demonstrated a left main stenosis as well as severe three-  vessel coronary disease with fairly well-preserved LV function.  There  are no significant valvular abnormalities.  Based on his coronary  anatomy and symptoms, his cardiologist, Dr. Aleen Campi felt that surgical  revascularization was indicated.   I examined the patient in the office and reviewed the results of the  cardiac cath with the patient and family.  I discussed indications and  expected benefits of coronary bypass surgery for treatment of his  coronary artery disease.  I reviewed the alternatives to surgical  therapy as well.  I discussed with the patient the risks of the  operation including risks of MI, CVA, bleeding, blood transfusion  requirement, infection, and death.  After reviewing these issues, he  demonstrated  his understanding and agreed to proceed with operation  under what I felt was an informed consent.   OPERATIVE FINDINGS:  The ascending aorta had heavy calcified plaque and  great care was taken and the placement of the cannulation as well as  placement of the single crossclamp.  The vein was harvested  endoscopically from the right leg and was a good conduit.  The mammary  artery was 1.5 mm but had excellent flow.  No packed cells were given  during the operation.  The coronaries were adequate targets.   PROCEDURE:  The patient was brought to operative room and placed supine  on the operating table where general anesthesia was induced.  The chest,  abdomen and legs were prepped with Betadine and draped as a sterile  field.  A sternal incision was made as the saphenous vein was harvested  endoscopically from the right leg.  The left internal mammary artery was  harvested as a pedicle graft from its origin at the subclavian vessels.  It was a good vessel with excellent flow.  Heparin was administered and  ACT was documented as being  therapeutic.  The sternal retractor was  placed in the pericardium, was opened and suspended.  Pursestrings were  placed in the ascending aorta and right atrium and the patient was  cannulated and placed on bypass.  The coronaries were identified for  grafting and the mammary artery and vein grafts were prepared for the  distal anastomoses.  Cardioplegia catheters were placed for both  antegrade and retrograde cold blood cardioplegia.  The patient was  cooled to 32 degrees.  The aortic crossclamp was applied.  800 mL of  cold blood cardioplegia was delivered in split doses between the  antegrade aortic and retrograde coronary sinus catheters.  There was  good cardioplegic arrest and septal temperature dropped less than 15  degrees.   The distal coronary anastomoses were then performed.  The first distal  anastomosis was the screw sequential vein graft to the  posterior  descending and posterolateral branch of the right coronary.  The  posterior descending was 1.5 mm vessel.  There is a heavily calcified  plaque at its origin with a 50% narrowing.  A side-to-side anastomosis  with the vein was sewn using running 7-0 Prolene.  The second distal  anastomosis was the continuation of the sequential vein graft to the  posterolateral.  This was a slightly smaller 1.4-mm vessel with proximal  80% stenosis and the end of the vein was sewn end-to-side with running 7-  0 Prolene.  There is good flow through the sequential vein graft and  cardioplegia was dosed.  The third distal anastomosis was of the obtuse  marginal branch of the circumflex.  This a 1.7 mm vessel with proximal  70% stenosis and a reverse saphenous vein was sewn end-to-side with  running 7-0 Prolene with good flow through graft.  The fourth distal  anastomosis was to the diagonal branch of the LAD.  This was a 1.5-mm  vessel with proximal 80% stenosis and a reverse saphenous vein was sewn  end-to-side with running 7-0 Prolene with good flow through graft.  Cardioplegia was redosed.  The fifth distal anastomosis was to the  distal third of the LAD which is a 1.5-mm vessel and had a proximal 90%  stenosis.  The left IMA pedicle was brought through an opening created  in the left lateral pericardium, was brought down onto the LAD and sewn  end-to-side with running 8-0 Prolene.  There is good flow through the  anastomosis after briefly releasing the pedicle bulldog on the mammary  artery.  The mammary bulldog was reapplied and the pedicle secured to  the epicardium with 6-0 Prolene sutures.  Cardioplegia was then redosed.   While the crossclamp was still in place, three proximal vein anastomoses  were performed on the ascending aorta with care being taken to avoid an  anastomosis in the area of the heavily calcified plaque.  Prior to tying  down the final proximal anastomosis, air was  vented from the coronaries  with a dose of retrograde warm blood cardioplegia.  The final proximal  anastomosis was tied and with the patient in the head down position, the  crossclamp was removed.   The heart resumed a spontaneous rhythm.  Air was aspirated from the vein  grafts with a 27 gauge needle.  The cardioplegia catheters were removed.  The patient was rewarmed to 37 degrees.  Temporary pacing wires were  applied.  When the patient was adequately rewarmed, the lungs re-  expanded.  The ventilator was resumed.  The patient was  weaned from  bypass without difficulty.  Hemodynamics were stable.  Protamine was  administered without adverse reaction.  The cannulas were removed.  The  mediastinum was irrigated with warm antibiotic irrigation.  It was noted  the patient had significant coagulation defect after the protamine so  platelets were administered with improved coagulation function.  Leg  incision was irrigated and closed in a standard fashion.  The superior  pericardial fat was closed over the aorta.  Two mediastinal and left  pleural chest tube were placed and brought out through separate  incisions.  The sternum was closed with interrupted steel wire.  The  pectoralis fascia was closed with running #1 Vicryl.  Subcutaneous and  skin layers were closed in running Vicryl and sterile dressings were  applied.  Total bypass time was 150 minutes and crossclamp time of 100  minutes.      Kerin Perna, M.D.  Electronically Signed    PV/MEDQ  D:  02/28/2007  T:  03/01/2007  Job:  [id number]   cc:   Antionette Char, MD

## 2010-08-23 NOTE — Consult Note (Signed)
[name], [name]                 ACCOUNT NO.:  [id number]   MEDICAL RECORD NO.:  [id number]          PATIENT TYPE:  INP   LOCATION:  2031                         FACILITY:  [facility]   PHYSICIAN:  Doylene Canning. Ladona Ridgel, MD    DATE OF BIRTH:  [birth year]   DATE OF CONSULTATION:  03/04/2007  DATE OF DISCHARGE:  03/05/2007                                 CONSULTATION   The consultation is requested by Dr. Kathlee Nations Trigt.   INDICATION FOR CONSULTATION:  Evaluation of tachycardia following bypass  surgery.   HISTORY OF PRESENT ILLNESS:  The patient is a 77 year old man who has a  history of hypertension and dyslipidemia, who presented to the hospital  with worsening chest pain and shortness of breath, and underwent  catheterization where he was subsequently found to have severe 3-vessel  coronary disease and underwent bypass surgery.  His postoperative state  has been fairly uneventful except that he has been fairly tachycardic on  telemetry.  His heart rates have typically run in the 90-120 range, but  at times will go up to the 155 range and be persistent.  This  is  despite being in bed rest.  This tachycardia sometimes occurs gradually  as it gradually warms up, or at other times it occurs fairly suddenly,  although the sudden increases typically running between 129 and 155  beats per minute.  The patient states that he has no palpitations, and  denies any history of palpitations or symptomatic SVT.  He does admit  that at times in the past his heart rate has been faster than usual,  even before he was found to have coronary disease with heart rates in  the 85-95 range on blood pressure monitoring.  He had no other specific  complaints.  He denies syncope or near syncope.  He has no chest pain at  present.  His postoperative course has been fairly uneventful otherwise.  His additional past medical history is notable for hypertension and  dyslipidemia.  His present medications include  Protonix, Colace, Advair,  Cardizem 240 a day, Zocor, lisinopril, metoprolol 25 t.i.d., clonidine  0.3 mg twice daily, and he is presently on amiodarone, though this, I  think, will be able to be discontinued soon enough.   SOCIAL HISTORY:  The patient is married and lives in [place].  He  denies tobacco abuse, stopping 20 years ago.  He denies alcohol use.   FAMILY HISTORY:  Notable for father dying of heart problems in his 75s,  and a mother of heart failure in her 48s.   REVIEW OF SYSTEMS:  Negative except for nocturia and occasional cough.  Otherwise, all negative except as noted in the HPI.  Please note all  systems were reviewed.   PHYSICAL EXAMINATION:  The patient is a pleasant, well-appearing man in  no acute distress.  The blood pressure was 134/78, the pulse was 110 and regular,  respirations were 20, temperature was 98.  HEENT:  Normocephalic and atraumatic.  Pupils equal and round.  Oropharynx was moist.  Sclerae were anicteric.  NECK:  Revealed  no jugular distention.  There was no thyromegaly.  The  trachea was midline.  The carotids were 2+ and symmetric.  LUNGS:  Clear  bilaterally to auscultation.  No wheezes, rales or rhonchi are present.  There is no increased work of breathing.  CARDIOVASCULAR EXAM:  Demonstrated regular rate and rhythm with normal  S1 and S2.  CHEST:  Demonstrated a healing median sternotomy.  ABDOMINAL:  Soft and nontender.  There was no organomegaly.  EXTREMITIES:  Demonstrated no edema.  NEUROLOGIC:  Alert and oriented x3 with cranial nerves intact.   The EKG demonstrates sinus tachycardia.  Telemetry demonstrates sinus  tach plus or minus atrial tach.   IMPRESSION:  1. Asymptomatic tachycardia, sinus tachycardia versus atrial      tachycardia.  2. 3-vessel coronary disease status post bypass surgery.  3. Sleep apnea on continuous positive airway pressure.  4. Hypertension.   DISCUSSION:  The etiology the patient's tachycardia is  unclear, though  he is minimally symptomatic.  At times it looks like sinus tach, at  other times it could be atrial tach.  Because he is completely  asymptomatic, I have recommended that he continued on AV nodal blocking  drugs, and continue postoperative care, and a period of watchful waiting  should help [place] declare itself.      Doylene Canning. Ladona Ridgel, MD  Electronically Signed     GWT/MEDQ  D:  03/04/2007  T:  03/05/2007  Job:  [id number]

## 2010-08-23 NOTE — Assessment & Plan Note (Signed)
OFFICE VISIT   ASHRAF, MESTA  DOB:  30-Oct-1930                                        March 29, 2007  CHART #:  [id number]   CURRENT PROBLEMS:  1. Status post coronary artery bypass graft x5 February 28, 2007 for      severe 3-vessel coronary artery disease with class III progressive      angina.  2. Hypertension.  3. Postoperative anemia.   CURRENT MEDICATIONS:  1. Amiodarone 200 mg b.i.d.  2. Cardizem CD 240 mg daily.  3. Lanoxin 0.125 mg daily.  4. Hydrochlorothiazide 25 mg daily.  5. Lopressor 25 mg p.o. b.i.d.  6. Lipitor 40 mg daily.  7. Tylox p.r.n.  8. Aspirin 1 daily.   PRESENT ILLNESS:  Mr. Lucken is a 77 year old male who underwent  multivessel coronary artery bypass surgery for severe 3-vessel coronary  artery anatomy.  He had IMA grafting to the LAD and vein graft to the  diagonal, posterior descending, posterolateral, and obtuse marginal.  Postoperatively, he had a persistent tachycardia with some intermittent  atrial fibrillation, and was treated with amiodarone, Cardizem, digoxin,  as well as Lopressor.  An EP consult was obtained, and they agreed with  the multidrug therapy.  He has continued to progress well at home.  His  complaint now is mainly of poor urinary stream, but he has no angina or  symptoms of CHF, and the surgical incisions are healing well.  He has  also had some constipation.   PHYSICAL EXAMINATION:  Blood pressure 140/70.  Pulse 72 and regular.  Respirations 18.  Saturation 95% on room air.  He is alert and pleasant.  Breath sounds are clear and equal.  The sternum is stable and well  healed.  Cardiac rhythm is regular without S3 gallop or murmur.  Abdominal exam is soft.  His extremities are without edema, and the vein  sites are well healed.  Neurologic exam is intact.   LABORATORY DATA:  PA and lateral chest x-ray reveals clear lung fields  with stable cardiac silhouette.   IMPRESSION AND PLAN:  The  patient's tachycardia has improved.  We will  reduce the amiodarone to once a day.  He will continue his other  medications.  The blood pressure is increasing, and I have asked him to  resume the clonidine that he was taking preoperatively.  Otherwise, he  knows he can resume driving and light activities.  He was encouraged to  start the outpatient rehab program in January, and to avoid lifting more  than 15 to 20 pounds until mid February.  Otherwise, he will return to  the care of Dr. Charolette Child and [facility].  Return here as needed.   Kerin Perna, M.D.  Electronically Signed   PV/MEDQ  D:  03/29/2007  T:  03/31/2007  Job:  [id number]   cc:   Antionette Char, MD  [facility]

## 2010-08-23 NOTE — Assessment & Plan Note (Signed)
[facility]                             PULMONARY OFFICE NOTE   [name], [name]                        MRN:          [id number]  DATE:11/09/2006                            DOB:          Jan 06, 1931    PROBLEMS:  1. Obstructive sleep apnea.  2. Allergic rhinitis.  3. Asthma.  4. Coronary disease/angina.  5. Hypertension.   HISTORY:  He has been staying in to avoid hot weather, but has been  feeling fairly stable from a respiratory standpoint. He does express  concern that he falls asleep too easily if he sits quietly. Caffeine  does not seem to have much effect. He is told that he does not snore  through CPAP and he recognizes that he is better off if he wears his  CPAP so that is on every night. He wakes rested in the morning. Usual  bedtime is around 11, waking between 5 and 5:30am. He gets up and walks  2 to 3 miles and then will nap in the morning and again in the mid-  afternoon. We discussed sleep hygiene, explaining that occasional need  to nap is not a problem. He seems to feel that this is more oppressive  than he wants.   MEDICATIONS:  1. Allergy vaccine continues at 1-50.  2. CPAP at 9 CWP.  3. Potassium 20 mEq.  4. Hydrochlorothiazide 25 mg.  5. Aspirin.  6. Caduet 10/10.  7. Quinapril 10 mg.  8. Advair 100/50, usually used just once daily.  9. Clonidine.  10.Nitroglycerin p.r.n.  11.Albuterol p.r.n.  12.Mucinex p.r.n.   He has an EpiPen which he has never needed.   ALLERGIES:  No medication allergy.   OBJECTIVE:  Weight 171 pounds, blood pressure 126/62, pulse 77, room air  saturation 97%. Heart sounds are regular without murmur or gallop. His  chest is quiet. He seems alert now. There is no nasal obstruction and no  pressure marks on his face from the mask.   IMPRESSION:  Complaint of daytime sleepiness may be normal sleep cycle  now for him. I do not get the impression that CPAP is under pressured.  As a way of  exploring, we talked about  options, and I am going to give him a sample of Provigil 200 mg to try  1/2 or 1 daily p.r.n. just to see what effect that has on his  complaints. Scheduling return in six months, earlier p.r.n.     Clinton D. Maple Hudson, MD, Tonny Bollman, FACP  Electronically Signed    CDY/MedQ  DD: 11/10/2006  DT: 11/11/2006  Job #: [id number]

## 2010-08-23 NOTE — Discharge Summary (Signed)
[name], [name]                 ACCOUNT NO.:  [id number]   MEDICAL RECORD NO.:  [id number]          PATIENT TYPE:  INP   LOCATION:  2031                         FACILITY:  [facility]   PHYSICIAN:  Kerin Perna, M.D.  DATE OF BIRTH:  July 10, 1930   DATE OF ADMISSION:  02/28/2007  DATE OF DISCHARGE:  03/05/2007                               DISCHARGE SUMMARY   PRIMARY ADMITTING DIAGNOSIS:  Left main and severe three-vessel coronary  artery disease.   ADDITIONAL/DISCHARGE DIAGNOSES:  1. Left main and severe three-vessel coronary artery disease.  2. History of previous PTCA 15 to 20 years ago.  3. Hypertension.  4. Hyperlipidemia.  5. Asthmatic bronchitis.  6. Sleep apnea on chronic CPAP.  7. Borderline glucose intolerance.  8. Left renal artery stenosis of 80%.  9. 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  10.Anxiety disorder.  11.Postoperative blood loss anemia.  12.Postoperative tachyarrhythmias.  13.Postoperative ileus, resolved.   PROCEDURES PERFORMED:  1. Coronary artery bypass grafting x5 (left internal mammary artery to      the LAD, saphenous vein graft to the diagonal, sequential saphenous      vein graft to the posterior descending and posterolateral arteries,      saphenous vein graft to the obtuse marginal).  2. Endoscopic vein harvest right leg.   HISTORY:  The patient is a 77 year old male who was recently referred to  Dr. Kathlee Nations Hickman for consideration of surgical revascularization.  He  has a known history of coronary artery disease having undergone PTCA  approximately 15 to 20 years ago.  He has been medically managed since  that time and has done well until recently when he began to develop  exertional shortness of breath and chest tightness relieved with rest.  He was seen by Dr. Aleen Hickman and underwent a stress test which was  strongly positive for ischemia.  He subsequently underwent cardiac  catheterization on November 12 which showed left main stenosis of 70%, a  proximal 70% stenosis of the LAD, a proximal stenosis of the OM, and a  distal right coronary artery stenosis of the PDPL bifurcation of 60 to  80%.  Dr. Donata Hickman evaluated the patient as an outpatient consultation  and reviewed his films and agreed that his best course of action would  be to proceed with CABG at this time.  He explained the risks, benefits  and alternatives of the surgery with the patient and his family and they  agreed to proceed.   HOSPITAL COURSE:  [name] was admitted to [facility] on February 28, 2007 and underwent CABG x5 as described in detail above performed by Dr.  Donata Hickman.  He tolerated the procedure well and was transferred to the  [facility] unit in stable condition.  He was able to be extubated shortly after  surgery.  He was hemodynamically stable and doing well on postop day  one.  At that time, his chest tubes and hemodynamic monitoring devices  were removed.  He was kept in the unit for overnight observation.  By  postop day two, he was ready  for transfer to the floor.  He developed a  mild ileus in the immediate postoperative and was treated with  conservative diet advancement.  His ileus has subsequently resolved and  he is having normal bowel and bladder function.  He is tolerating a  regular diet without difficulty.  Also, postoperatively he has had  problems with intermittent tachyarrhythmias.  He was treated with beta-  blockade and rate controlled with amiodarone, Cardizem and digoxin.  An  electrophysiology consultation was obtained and the patient was seen by  Dr. Ladona Hickman.  He felt that since the patient was rate controlled on his  present medication regimen, that this could be managed as an outpatient.  Otherwise, he has done well postoperatively.  He has had a mild blood  loss anemia which did not require a transfusion.  He has also been  somewhat volume overloaded and has been diuresed back down to just below  his preoperative weight.  His  incisions are all healing well.  He has  been ambulating in the halls with cardiac rehab phase I and is making  good progress.  He is tolerating a regular diet and is having normal  bowel and bladder function.  His most recent labs show a sodium 144,  potassium 3.7, BUN 19, creatinine 0.93, hemoglobin 10.8, hematocrit  30.8, white count 9.3, platelets 138.  It is felt that since he is doing  well and his heart rate has remained stable, that he may discharged home  at this time.   DISCHARGE MEDICATIONS:  Are as follows:  1. Aspirin 81 mg q.d.  2. Lopressor 25 mg q.8 hours.  3. Lisinopril 20 mg q.d.  4. Lipitor 10 mg q.h.s.  5. Amiodarone 200 mg b.i.d.  6. Cardizem CD 240 mg q.d.  7. Lanoxin 0.125 mg q.d.  8. Advair 250/50 one puff b.i.d.  9. Clonidine 0.3 mg b.i.d.   DISCHARGE INSTRUCTIONS:  1. He is asked to refrain from driving, heavy lifting or strenuous      activity.  2. He may continue ambulating daily and using his incentive      spirometry.  3. He may shower daily and clean his incisions with soap and water.  4. He will continue a low fat, low-sodium diet.   DISCHARGE FOLLOWUP:  1. He will need to make an appointment to see Dr. Aleen Hickman in two      weeks.  2. He will follow up with Dr. Donata Hickman on December 19 with a chest x-      ray from [facility].   If he experiences any problems or have questions in the interim, he is  asked to contact our office.      [name], P.A.      Kerin Perna, M.D.  Electronically Signed    GC/MEDQ  D:  03/05/2007  T:  03/05/2007  Job:  [id number]   cc:   Antionette Char, MD

## 2010-08-23 NOTE — H&P (Signed)
NAMETERMAINE, ROUPP                 ACCOUNT NO.:  [id number]   MEDICAL RECORD NO.:  [id number]          PATIENT TYPE:  INP   LOCATION:  5524                         FACILITY:  [facility]   PHYSICIAN:  Felipa Evener, MD  DATE OF BIRTH:  Sep 08, 1930   DATE OF ADMISSION:  10/28/2007  DATE OF DISCHARGE:                              HISTORY & PHYSICAL   HISTORY OF PRESENT ILLNESS:  The patient is a 77 year old male who  presented with a history of severe asthma for many years as well as 70-  pack year history of smoking starting at age 13, and quitting at age 17,  3 packs per day.  The patient has had her last surgical consultation  done in late 2008 for CABG and has recovered nicely, but however for the  past 4 months, the patient has been noticing increased episodes of  shortness of breath with dyspnea on exertion.  According to them, he  also noticed that prior to the shortness of breath beginning, that his  stools were melanotic and are maroonish in color.  At that time, the  patient sought medical attention for it and did go to Dr. __________  for four times.  It appears that the patient was given steroids and  antibiotics and was sent home.  However, last time he was concerned that  there might be more, as the patient __________ and after relating that  information, the patient was sent to hospital for further evaluation.  The patient is hard of hearing; however, the history is mostly obtained  from the wife, who reported the history as mentioned above.  Denied any  cough, fever, chills, nausea, vomiting, abdominal pain, or chest pain.  Denied any hemoptysis or any nose bleed.  Did report melena and evidence  of bright red blood per rectum at that time for the past 2 months.   PAST MEDICAL HISTORY:  1. Significant for asthma as mentioned above.  2. Obstructive sleep apnea.  3. History of tobacco abuse.  4. Coronary artery disease.  5. Hypertension.  6. Severe allergic rhinitis.   MEDICATIONS AT HOME:  1. Advair 100/25 one puff b.i.d.  2. Albuterol p.r.n.  3. Flomax.  4. Aspirin 81 mg daily.  5. __________ 25 mg q.8h.  6. Lisinopril 20 mg daily.  7. Lipitor 10 mg at night.  8. He was on amiodarone and Cardizem CD, but that is no longer on his      medication list.   ALLERGIES:  No known drug allergies.   FAMILY HISTORY:  Noncontributory.   SOCIAL HISTORY:  He smoked 3 packs a day starting at age 24 until he was  59.  Denies any alcohol or drug abuse.  Married.  Lives in [place]  with his wife.  No significant occupational history.   REVIEW OF SYSTEMS:  A 12-point review of systems is negative other than  mentioned above.   PHYSICAL EXAMINATION:  GENERAL:  This is a well-appearing male  __________ examined in bed in no acute distress.  VITAL SIGNS:  Temperature 97.6, heart rate 87, respirations 20,  blood  pressure 105/64, and O2 saturation 95% on room air.  HEENT:  Normocephalic and atraumatic.  Pupils equal, round, reactive to  light.  Extraocular movements intact.  Oral mucosa within normal limits.  HEART:  Regular rate and rhythm.  S1 and S2.  No murmurs or gallops  appreciated.  LUNGS:  Clear to auscultation bilaterally.  ABDOMEN:  Soft, nontender, and nondistended.  Positive bowel sounds.  EXTREMITIES:  No edema, clubbing, or cyanosis.  NEUROLOGIC:  Grossly intact.   LABORATORY DATA:  Laboratory studies are all pending at this point.   ASSESSMENT AND PLAN:  The patient is a 77 year old male who had a  history of asthmatic bronchitis as well as coronary artery disease,  hypertension, previous status post CABG in November 2008, presenting  with chronic gastrointestinal bleeding and shortness of breath.  His  shortness of breath is most likely related to anemia, as the patient  appeared very pale on examination, and his lung exam is completely  benign.  His O2 saturation is 95% on room air, which most likely seems  to be related to asthma and/or  bronchitis, but there is no cough, no  fevers, no chills, and no significant sputum production.  Therefore at  this point we will check cardiac stress, cardiac enzymes, given his  chronic use of __________, we will check a 2-D echo to evaluate for his  ejection fraction.  Admit the patient to telemetry unit.  Check a BNP,  BMP, CBC, and coags.  __________ x3 will be sent.  Continue Advair  100/25 p.o. b.i.d., albuterol inhaler 2 puffs q.2h. p.r.n.  We will  order a chest x-ray film to evaluate for pulmonary edema.  EKG will be  ordered.  We will resume previous home medications.  We will __________  order CPAP while sleep for __________ titration protocol.  With regards  to allergic rhinitis, we will prescribe Flonase 1 spray per nostril  b.i.d.  We will have Dr. Jetty Duhamel see the patient in the morning.      Felipa Evener, MD  Electronically Signed     WJY/MEDQ  D:  10/28/2007  T:  10/29/2007  Job:  [id number]

## 2010-08-23 NOTE — Consult Note (Signed)
NEW PATIENT CONSULTATION   [name], [name]  DOB:  11-11-1930                                        February 25, 2007  CHART #:  [id number]   REFERRING PHYSICIAN:  Aram Candela. Tysinger, MD.   PRIMARY CARE PHYSICIAN:  [facility] [address].   REASON FOR CONSULTATION:  Left main and three-vessel coronary artery  disease.   CHIEF COMPLAINT:  Shortness of breath and chest pain.   HISTORY OF PRESENT ILLNESS:  I was asked to evaluate this 77 year old  hypertensive white male for possible surgical coronary revascularization  for recently diagnosed severe three-vessel coronary artery disease.  The  patient has a 20 years history of coronary disease and underwent a  percutaneous angioplasty 15 to 20 years ago.  He has done well until  recently when he noticed in the cool weather that his exercise tolerance  had declined.  He has developed exertional shortness of breath and chest  tightness and chest pain relieved by rest.  He was evaluated by Aram Candela.  Tysinger, MD who performed a stress test which was strongly positive for  ischemia.  He underwent a cardiac catheterization by Dr. Aleen Campi on  November 12 which demonstrated a 70% left main stenosis, proximal 70%  stenosis of the LAD diagonal, proximal stenosis of the obtuse marginal  and a distal right coronary artery stenosis of the posterior descending,  posterior lateral bifurcation of 60 to 80%.  His EF was normal and LVEDP  was normal.  There is no transvalvular aortic gradient.  There is no  evidence of mitral regurgitation.   He does have a left renal artery 80% stenosis.  He does have a right  iliac stenosis of 50%.  Based on his coronary anatomy symptoms and  positive stress test he was felt to be a candidate for surgical coronary  revascularization.   PAST MEDICAL HISTORY:  1. Hypertension.  2. Hyperlipidemia.  3. Anxiety disorder.  4. Asthmatic bronchitis and sleep apnea on CPAP.  5. No known drug  allergies.  6. Borderline glucose intolerance.   HOME MEDICATIONS:  1. Aspirin 81 mg.  2. Caduet.  3. Quinapril.  4. Hydrochlorothiazide.  5. Potassium.  6. Advair Disk.  7. Clonidine 0.3 b.i.d.  8. Albuterol 2 puffs t.i.d. p.r.n.   SOCIAL HISTORY:  The patient is retired, but is still quite active doing  yard work and gardening on his large yard.  He does not smoke or use  alcohol.   FAMILY HISTORY:  Positive for coronary artery disease and heart disease.  His brother has had a heart transplant at [facility] four years ago.   REVIEW OF SYSTEMS:  CONSTITUTIONAL REVIEW:  Negative for fever or weight  loss or night sweats.  HEENT EXAM:  Significant recent cataract surgery  which was successful.  He denies any difficulty swallowing.  He is  totally edentulous and he wears upper dental plates.  THORAC REVIEW:  Negative for thoracic trauma, productive cough, positive for mild  intermittent wheezing from asthmatic bronchitis.  CARDIAC REVIEW:  Positive for his coronary artery disease.  Negative for any cardiac  valve disease.  His cardiac cath does show calcification of the  ascending and transverse aortic arch.  ABDOMINAL REVIEW:  Negative for  abdominal pain, nausea, vomiting, diarrhea or blood per rectum,  hepatitis  or jaundice.  ENDOCRINE REVIEW:  Negative for thyroid disease.  Positive for borderline diabetic tendency.  VASC REVIEW:  Negative for  DVT, claudication or TIA.  NEUROLOGIC REVIEW:  Negative for stroke or  seizure.  HEMATOLOGIC REVIEW:  Negative for bleeding disorder or blood  transfusion.   PHYSICAL EXAMINATION:  The patient is 5 feet, 1, weighs 170 pounds,  blood pressure 200/95, pulse 84, respirations 18.  Saturation is 95%.  GENERAL APPEARANCE:  Of a short, anxious male who is oriented, but in no  acute distress.  HEENT EXAM:  Normocephalic.  Pupils are equal.  He is edentulous.  NECK:  Supple without JVD, mass or carotid bruit.  LYMPHATIC EXAM:  No palpable  adenopathy.  Breath sounds are clear and equal.  CARDIAC EXAM:  Regular rhythm without S3 gallop, murmur or rub.  ABDOMINAL EXAM:  Soft without pulsatile mass.  EXTREMITIES:  Reveal mild clubbing, but no cyanosis or edema. Peripheral  pulses are intact in the extremities and there is no venous  insufficiency of the lower extremities.  NEUROLOGIC EXAM:  He is alert and oriented without focal motor deficit.   LABORATORY DATA:  I reviewed the coronary arteriograms and he has left  main and three-vessel disease with good LV function and some  atherosclerotic calcification of the thoracic aorta.  His left renal  artery has an 80% stenosis and he has mild to moderate stenosis of the  right iliac at the aortic bifurcation.  His lab work prior to cardiac  cath indicates creatinine of 1, BUN of 24.  Hematocrit of 43, platelet  count 154.  LFTs normal.  Albumin 3.4.  Sodium 136.   IMPRESSION AND PLAN:  The patient has severe multi-vessel coronary  disease and would benefit from surgical revascularization.  He will be  scheduled for surgery on November 20.  We will plan on placing bypass  grafts to the left anterior descending artery, diagonal, obtuse  marginal, and distal right.  I reviewed the indications, benefits and  alternatives to surgery with the patient and his wife, as well as the  details of the surgery.  He understands the associated risks of  bleeding, stroke, infection, myocardial infarction and death.  He agreed  to proceed with surgery as scheduled for November 20.   Thank you for the consultation.   Kerin Perna, M.D.  Electronically Signed   PV/MEDQ  D:  02/25/2007  T:  02/26/2007  Job:  [id number]   cc:   Antionette Char, MD  TC/TS office

## 2010-08-23 NOTE — Assessment & Plan Note (Signed)
 [facility]                             PULMONARY OFFICE NOTE   [name], [name]                        MRN:          [id number]  DATE:12/31/2006                            DOB:          Apr 15, 1930    PROBLEM:  1. Obstructive sleep apnea.  2. Allergic rhinitis.  3. Asthma.  4. Coronary disease/angina.  5. Hypertension.   HISTORY:  Flare of wheezing and chest congestion over the past five days  with dry cough.  He wants to go to the beach in October.  He continues  his allergy vaccine at 1/50 without problems, currently noticing some  wheezing with  no fever or sore throat.  No sick exposure.  He has  really liked being able to use Provigil occasionally for daytime  sleepiness, not adequately controlled with his CPAP.   MEDICATIONS:  Medications were reviewed without changes noted.  See  front of chart.   OBJECTIVE:  Weight 169 pounds, blood pressure 154/72, pulse 79, room air  saturation 99%, pulse is regular without murmur.  LUNGS:  Wheeze on forced expiration only.  Dry cough, nasal congestion,  no edema.   IMPRESSION:  1. Acute exacerbation of asthma with bronchitis.  2. Previous PFTs that suggested only a minimal small airway      obstruction.  3. Sleep apnea, well-controlled, but with residual daytime sleepiness      needing cautious occasional uses of Provigil.   PLAN:  We again discussed sleep hygiene but focused on his respiratory  complaint.  He was given nebulized Xopenex 1.25 mg, prednisone 8-day  taper from 40 mg, Z-pack to hold with discussion.  Keep scheduled  appointment, earlier p.r.n.     Clinton D. Maple Hudson, MD, Tonny Bollman, FACP  Electronically Signed    CDY/MedQ  DD: 01/05/2007  DT: 01/05/2007  Job #: [id number]

## 2010-08-23 NOTE — Op Note (Signed)
[name], [name]                 ACCOUNT NO.:  [id number]   MEDICAL RECORD NO.:  [id number]          PATIENT TYPE:  INP   LOCATION:  2305                         FACILITY:  [facility]   PHYSICIAN:  Guadalupe Maple, M.D.  DATE OF BIRTH:  [birth year]   DATE OF PROCEDURE:  02/28/2007  DATE OF DISCHARGE:                               OPERATIVE REPORT   PROCEDURE:  Intraoperative transesophageal echocardiography.   [name] is a 77 year old white male with a history of coronary  artery disease undergoing coronary artery bypass grafting by Dr. Donata Clay. Intraoperative transesophageal echocardiography was felt  necessary to assess if few there was any valvular pathology to assess  left and right ventricular function and to serve as a monitor of her  intraoperative volume status.   The patient was brought to the operating room at [facility].  General anesthesia was induced without difficulty.  The trachea was  intubated without difficulty and the transesophageal echocardiography  probe was inserted into the esophagus without difficulty.   IMPRESSION OF BYPASS FINDINGS:  1. The aortic valve was thickened and there was mild aortic annular      calcification but the valve opened well and there was trace aortic      insufficiency.  2. Mitral valve.  There was moderate mitral annular calcification      noted but the valve opened well and there was no prolapse or      fluttering of the valve and there was trace mitral insufficiency.  3. Left ventricle. There was good contractility of the left ventricle      and all segments interrogated. Ejection fraction was estimated at      55-60%. There was a left ventricular wall thickness measuring 1 to      1.1 cm and there was no significant left ventricular hypertrophy.      There was no thrombus noted in the left ventricular apex.  4. Right ventricle.  The right ventricular size was normal and there      was good contractility of the  right ventricular free wall.  5. Tricuspid valve. The tricuspid valve appeared structurally normal      with trace to 1+ tricuspid insufficiency.  6. Interatrial septum.  The interatrial septum was intact with no      evidence of patent foramen [place] by color Doppler or bubble study.  7. Left atrium.  There was no thrombus noted in the left atrium or      left atrial appendage.  8. Ascending aorta. The ascending aorta was thickened but no mobile      plaques were appreciated.  9. Descending aorta.  The descending aorta measured 2.2 cm in diameter      with mild to moderate atheromatous disease present.   POST BYPASS FINDINGS:  1. Aortic valve:  The aortic valve was unchanged from the prebypass      study, the leaflets were thickened, there was mild calcification of      the leaflets and the annulus but normal opening of the aortic valve  and trace aortic insufficiency.  2. Mitral valve.  The mitral valve again showed moderate mitral      annular calcification and trace mitral      insufficiency.  3. Left ventricle.  The left ventricular function appeared good with      hyperdynamic function and ejection fraction estimated at 60-65%.           ______________________________  Guadalupe Maple, M.D.     DCJ/MEDQ  D:  02/28/2007  T:  03/01/2007  Job:  [id number]

## 2010-08-23 NOTE — Discharge Summary (Signed)
[name], [name]                 ACCOUNT NO.:  [id number]   MEDICAL RECORD NO.:  [id number]          PATIENT TYPE:  INP   LOCATION:  5524                         FACILITY:  [facility]   PHYSICIAN:  Clinton D. Maple Hudson, MD, FCCP, FACPDATE OF BIRTH:  May 26, 1930   DATE OF ADMISSION:  10/28/2007  DATE OF DISCHARGE:  11/01/2007                               DISCHARGE SUMMARY   DISCHARGE DIAGNOSES:  1. Blood in stool.  2. Chronic asthma with acute exacerbation.  3. Diverticulosis.  4. Diaphragmatic hernia.  5. Esophageal stricture.  6. Liver enzyme elevation.  7. Coronary artery disease.  8. Obstructive sleep apnea.  9. Hypertension, not otherwise specified.  10.Peripheral vascular disease.  11.Fluid and electrolyte abnormalities.  12.Allergic rhinitis.   PROCEDURES:  Fiberoptic colonoscopy and small bowel endoscopy.   BRIEF HISTORY:  A 77 year old white male with a long history of asthma  who had developed an exacerbation during this spring, which was  unusually slow to clear.  He was treated with prednisone tapers and  bronchodilators, but continued to complain of weakness with some nausea  and weight loss and he was admitted for stabilization and evaluation.  Despite repeated questioning about review of systems and office  followups, he first reported melena on arrival at the hospital and  indicated this had been ongoing over the last 2 months with no bright  red blood.   Past medical history had been significant for chronic asthma with  relatively normal pulmonary function tests as well coronary artery  disease, obstructive sleep apnea on CPAP, renal artery stenosis, right  iliac stenosis, and hypertension.   Outpatient CT scan had shown apical ground-glass infiltrates and  outpatient lab work had been significant for sedimentation rate of 100  with elevated liver/muscle enzymes.   Examination on admission revealed BP 142/71 with pulse of 71, room air  saturation 95%, bilateral  wheeze, and old sternotomy scar without  murmur.  Abdomen is nontender.  Extremities without edema.   Initial hemoglobin was 13.3 with WBC 8500.  AST of 628 and ALT of 851.  Normal cardiac enzymes with B-natriuretic peptide of 67.  He was covered  with bronchodilators, supplemental oxygen, and his nighttime CPAP.  He  was seen by Dr. Yancey Flemings in GI consultation who arranged EGD and  colonoscopy, followed up hepatic enzymes with impression finally that  they probably reflected medications such as amiodarone or Lipitor.  The  patient was afebrile in the hospital and breathing improved to baseline  fairly quickly.  He was seen by Dr. Daiva Eves in Infectious Disease  consultation with my suspicion that his prolonged respiratory complaints  over the previous month and a half reflected an H1N1 influenza with slow  postviral recovery.  It was opinion of Infectious Disease consultants  that there would be no value to send any specific serology testing since  the illness had essentially resolved as he was being admitted.  That  allowed the rest of the focus of the hospital stay to be directed  towards evaluation of his GI problems.  There was no evidence of  subsequent GI bleeding and no anemia.  Imaging revealed a cystic lesion  on the liver, which was to be followed up by Dr. Marina Goodell as an outpatient.  Colonoscopy revealed diverticulosis with no intervention required.  Upper endoscopy revealed hiatal hernia and esophageal stricture.  Blood  pressure control was satisfactory.  He was placed back on his Cordarone,  but left off statin drugs for outpatient followup with transaminases.  Fluid and electrolyte abnormalities including hypokalemia were managed  with replacement.   Final impression was that his exacerbation of asthma had resolved  consistent with slow resolution of a viral pneumonitis in the spring,  quite possibly H1N1 influenza but unproven.  He had had some outpatient  melena, but  this had stopped and stools for blood were negative in the  hospital with upper and lower endoscopy as described.  A multicystic  liver lesion, possibly a complex cyst, unlikely to be related to the  elevated transaminases, pending report of MRI.  Hypertension, better  controlled.  Atherosclerosis with peripheral vascular disease, history  of coronary disease, and coronary bypass stable with continued  Cordarone, and hypokalemia, replaced.   LABORATORY DATA:  EKG showed sinus rhythm with premature  supraventricular complexes, incomplete right bundle-branch block, and  minimal criteria for LVH.  MRI of the abdomen compared with an abdominal  CT from October 23, 2007 describes numerous hyperintense hepatic masses,  largest in the medial segment of the left hepatic lobe, the largest of  which having 2 components, a cyst and an immediately adjacent enhancing  noncystic mass, primary differential consideration being metastasis or a  primary hepatic malignancy.  Radiologist suggested comparison with prior  studies or consideration of an ultrasound or CT-guided biopsy of the  mass.  Chest x-ray of July 21 showed chronic and postoperative changes  with no acute or interval change.   Admission WBC 8500 with hemoglobin 13.3, hematocrit 39.2, and platelets  386,000.  Sedimentation rate was 33 compared with 100 as recent  outpatient determination.  Occult blood was negative.  Prothrombin time  15.4, INR 1.2, and PTT 30.  Admission sodium 130, potassium 3.3,  chloride 95, CO2 29, glucose 108, BUN 10, creatinine 1.06, potassium  nadir was 2.7 before repletion, total protein 5.1, and albumin 2.1.  AST  628, ALT 851, ALP 80, total bilirubin 1.2, direct bilirubin 0.5,  indirect 0.7, and phosphate is 3.3.  On final determination, total  protein 5.4, albumin 2.2, AST 180, ALT 424, ALP 99, and total bilirubin  0.8.  Cardiac enzymes showed no injury.  B-natriuretic peptide was 67.  TSH normal at 0.485.   Hepatitis acute panel was negative.   DISCHARGE PLANS:  He is discharged home with family, to outpatient  followup with Dr. Maple Hudson and with an appointment to see Dr. Marina Goodell on  December 03, 2007.  Diet and activity as tolerated.  Lipitor and  lisinopril were discontinued.   MEDICATIONS:  1. Aspirin 81 mg.  2. Advair 100/50 one puff b.i.d.  3. Clonidine 0.3 mg b.i.d.  4. Nitroglycerin 0.4 sublingual p.r.n.  5. ProAir HFA rescue inhaler 2 puffs q.i.d. p.r.n.  6. Cordarone 200 mg b.i.d.  7. Fish oil 1000 mg.  8. Diovan/HCT 160/12.5 b.i.d.  9. Flomax 0.4 mg.  10.K-Dur 20 mEq daily.      Clinton D. Maple Hudson, MD, Tonny Bollman, FACP  Electronically Signed     CDY/MEDQ  D:  11/26/2007  T:  11/26/2007  Job:  [id number]   cc:   Wilhemina Bonito. Marina Goodell,  MD

## 2010-08-24 ENCOUNTER — Encounter (HOSPITAL_COMMUNITY): Payer: Medicare Other

## 2010-08-25 ENCOUNTER — Ambulatory Visit: Payer: Medicare Other | Admitting: Cardiology

## 2010-08-26 ENCOUNTER — Encounter (HOSPITAL_COMMUNITY): Payer: Medicare Other

## 2010-08-26 NOTE — Assessment & Plan Note (Signed)
[facility]                               PULMONARY OFFICE NOTE   [name], [name]                        MRN:          [id number]  DATE:11/01/2005                            DOB:          1930-11-17    PROBLEM LIST:  1.  Obstructive sleep apnea.  2.  Allergic rhinitis.  3.  Asthma.  4.  Coronary disease/angina.  5.  Hypertension.   HISTORY:  He feels stable with no changes to report.  He had put his allergy  vaccine in the freezer by mistake and rewarmed it; we discussed this.  We  took time again today to go through a detailed discussion of allergy vaccine  goals and alternatives, potential risks and concerns.  We discussed policy  concerning administration outside of a medical facility and the importance  of having someone with him when he gets his shot, anaphylaxis and  epinephrine.  We refilled his epinephrine prescription.  He is using Advair  100/50 just once daily and I told him he did not need to use Qvar also, so  that is being dropped off his list.  He will increase Advair to b.i.d. when  needed.  We reviewed his medication list as charted.   OBJECTIVE:  Weight 164 pounds, BP 142/80, pulse regular at 65, room air  saturation 96%.  He is alert, ambulatory, somewhat hard of hearing, but he  expressed good understanding of our discussions today.  Lung fields clear.  Nasal airway clear.  Heart sounds regular without murmur.   IMPRESSION:  1.  Obstructive sleep apnea, well-controlled on continuous positive airway      pressure at 9 centimeters of water pressure.  2.  Allergic rhinitis and asthma.   PLAN:  1.  Vaccine risk and education discussion as above with refill of      epinephrine.  2.  Scheduled return in 6 months, earlier p.r.n.                                   Clinton D. Maple Hudson, MD, FCCP, FACP   CDY/MedQ  DD:  11/01/2005  DT:  11/02/2005  Job #:  [id number]   cc:   [address] [place], [place] [facility]

## 2010-08-26 NOTE — Assessment & Plan Note (Signed)
[facility]                               PULMONARY OFFICE NOTE   ZADKIEL, DRAGAN                        MRN:          [id number]  DATE:12/13/2005                            DOB:          26-Jul-1930    PROBLEM:  1. Obstructive sleep apnea.  2. Allergic rhinitis.  3. Asthma.  4. Coronary disease/angina.  5. Hypertension.   HISTORY:  He says he was taken off of Coreg, and his breathing got better.  Dr. Adelene Idler office is helping him adjust medications.  He continues CPAP  at 9 CWP, and is used to sleeping with this now every night with no  problems.  He still tries to walk 3 miles a day, but is avoiding the  afternoon heat.  He continues giving his own allergy vaccine at 1:10 with no  problems.  He is not often needing his albuterol inhaler.   MEDICATIONS:  1. CPAP at 9 CWP.  2. Allergy vaccine.  3. Potassium 20 mEq.  4. Hydrochlorothiazide 25 mg.  5. Aspirin.  6. Caduet 10/10 mg.  7. Quinapril 10 mg.  8. Advair 100/50.  9. Cardizem LA 180 mg.  10.Nitroglycerin.  11.Albuterol inhaler.  12.EpiPen.   ALLERGIES:  No medication allergy.   OBJECTIVE:  VITAL SIGNS:  Weight 166 pounds, BP 162/80, pulse regular 83,  room air saturation 97%.  CHEST:  Quiet and clear, breathing does not seem labored.  NECK:  There is no neck vein distention or edema.  HEENT:  His nasal airway is unobstructed with no significant drainage.  CARDIAC:  Heart sounds are regular without murmur.  He looks comfortable.   IMPRESSION:  Obstructive sleep apnea is well controlled.  Allergic rhinitis  is currently stable.  His asthma with chronic obstructive pulmonary disease  is stable.   PLAN:  No changes to offer.  I did give a sample of Advair 250/50 at his  request, because that is what we had.  He will use that and drop back again  to his own supply of 100/50.  Schedule return in 6 months, earlier p.r.n.                                   Clinton D. Maple Hudson,  MD, [facility], FACP   CDY/MedQ  DD:  12/16/2005  DT:  12/17/2005  Job #:  [id number]   cc:   Antionette Char, MD  [facility]

## 2010-08-26 NOTE — Assessment & Plan Note (Signed)
[facility]                             PULMONARY OFFICE NOTE   [name], [name]                        MRN:          [id number]  DATE:06/05/2006                            DOB:          1930-08-07    PROBLEMS:  1. Obstructive sleep apnea.  2. Allergic rhinitis.  3. Asthma.  4. Coronary disease/angina.  5. Hypertension.   HISTORY:  He started himself on Mucinex for a 5-day illness with head  and chest congestion.  He is waking at night wheezing, cough  nonproductive, no fever, no muscle aches.   MEDICATIONS:  1. CPAP continues at 9 CWP.  2. Allergy vaccine.  3. Potassium 20 mEq.  4. Hydrochlorothiazide 25 mg.  5. Aspirin.  6. Caduet 10/10.  7. Quinapril 10 mg.  8. Advair 100/50.  9. Clonidine.  10.Nitroglycerin.  11.Albuterol inhaler.  12.EpiPen.  13.Mucinex.   ALLERGIES:  No medication allergy.   OBJECTIVE:  Weight 172 pounds, BP 142/80, pulse regular 75, room air  saturation 97%.  There is moderate nasal congestion, markedly diminished breath sounds  bilaterally and some mildly congested-sounding cough with no dullness.  I do not hear a murmur or gallop.  There is no peripheral edema.   IMPRESSION:  Acute exacerbation of asthmatic bronchitis with rhinitis  consistent with a viral syndrome.  His sleep apnea control is stable.   PLAN:  1. Fluids, supportive care, rest.  2. Nebulizer treatment here Xopenex 1.25 mg.  3. Depo-Medrol 80 mg IM.  4. Prednisone 8-day taper from 40 mg.  5. He will keep his scheduled appointment, earlier p.r.n.     Clinton D. Maple Hudson, MD, [facility], FACP  Electronically Signed    CDY/MedQ  DD: 06/05/2006  DT: 06/06/2006  Job #: [id number]

## 2010-08-26 NOTE — Assessment & Plan Note (Signed)
[facility]                             PULMONARY OFFICE NOTE   FADY, STAMPS                        MRN:          [id number]  DATE:05/02/2006                            DOB:          [birth year]    PROBLEM:  1. Obstructive sleep apnea.  2. Allergic rhinitis.  3. Asthma.  4. Coronary disease/angina.  5. Hypertension.   HISTORY:  He says his asthma is doing very well on Advair 100/50.  When  he was here in September, I had given a two-week sample of Advair 250.  He does not really remember but assumes that helped at the time.  He  continues routine use of CPAP at 9 CWP but notices that he is falling  asleep at 11 in the morning in front of his TV set.  He usually takes a  short nap.  He feel he sleeps well during the night with bedtime around  11 p.m., waking around 5:30 or 6:30.  I pointed out that this might be  only 6-1/2 hours of sleep and it just might not be quite enough for him.  He is told that he does not snore when he wears the CPAP.  He has one  cup of coffee in the morning and does not recognize a change in pattern.  We considered a fact of winter time, early night fall, etc.  He  continues allergy vaccine at 1/50 with no problems.   MEDICATIONS:  1. CPAP 9 CWP.  2. Allergy vaccine.  3. Potassium 20 mEq.  4. HCTZ 25 mg.  5. Aspirin.  6. Caduet 10/10.  7. Quinapril 10 mg.  8. Advair 100/50 used once or twice daily.  9. Clonidine.  10.Nitroglycerin p.r.n. use.  11.Albuterol inhaler.  12.He has an epi pen.   No medication allergy.   OBJECTIVE:  Weight 176 pounds.  BP 138/62, pulse regular 75, room air  saturation 99%.  He sounds pretty good.  Nose is not congested.  Lungs  sound quiet and clear and breathing is unlabored.  Heart sounds are  regular.  There is no murmur.  No edema.   IMPRESSION:  Currently satisfactory control of his asthma and his sleep  apnea.  I am not sure why he is feeling a little tired during the  daytime.  We can try adjusting his CPAP pressure if  necessary.  I also suggested he might want to try low doses of caffeine  tablet occasionally and restrict naps to only 10-15 minutes.  Schedule  return in six months, earlier p.r.n.     Clinton D. Maple Hudson, MD, [facility], FACP  Electronically Signed    CDY/MedQ  DD: 05/02/2006  DT: 05/03/2006  Job #: [id number]

## 2010-08-29 ENCOUNTER — Encounter (HOSPITAL_COMMUNITY): Payer: Medicare Other

## 2010-08-31 ENCOUNTER — Encounter (HOSPITAL_COMMUNITY): Payer: Medicare Other

## 2010-09-02 ENCOUNTER — Telehealth: Payer: Self-pay | Admitting: Adult Health

## 2010-09-02 ENCOUNTER — Encounter (HOSPITAL_COMMUNITY): Payer: Medicare Other

## 2010-09-02 MED ORDER — BUDESONIDE 0.25 MG/2ML IN SUSP: 0.2500 mg | Freq: Two times a day (BID) | RESPIRATORY_TRACT | Status: DC

## 2010-09-02 NOTE — Telephone Encounter (Signed)
Spoke with pharmacist and okayed refill for budesonide. Nothing further needed.

## 2010-09-05 ENCOUNTER — Encounter (HOSPITAL_COMMUNITY): Payer: Medicare Other

## 2010-09-06 ENCOUNTER — Ambulatory Visit (INDEPENDENT_AMBULATORY_CARE_PROVIDER_SITE_OTHER): Payer: Medicare Other | Admitting: Cardiology

## 2010-09-06 ENCOUNTER — Encounter: Payer: Self-pay | Admitting: Cardiology

## 2010-09-06 VITALS — BP 148/70 | HR 80 | Resp 16 | Ht 62.0 in | Wt 152.0 lb

## 2010-09-06 DIAGNOSIS — I2510 Atherosclerotic heart disease of native coronary artery without angina pectoris: Secondary | ICD-10-CM

## 2010-09-06 DIAGNOSIS — I10 Essential (primary) hypertension: Secondary | ICD-10-CM

## 2010-09-06 DIAGNOSIS — E785 Hyperlipidemia, unspecified: Secondary | ICD-10-CM

## 2010-09-06 LAB — HEPATIC FUNCTION PANEL
ALT: 13 U/L (ref 0–53)
AST: 15 U/L (ref 0–37)
Albumin: 3.7 g/dL (ref 3.5–5.2)
Alkaline Phosphatase: 66 U/L (ref 39–117)
Total Protein: 6.8 g/dL (ref 6.0–8.3)

## 2010-09-06 NOTE — Assessment & Plan Note (Signed)
He has had no new symptoms. He has recovered from his catheterization and we will continue to pursue risk reduction.

## 2010-09-06 NOTE — Progress Notes (Signed)
HPI The patient presents for followup after hospitalization with recent PCI of the vein graft. He had a followup with groin pain and bruising but was not found to have any persistent trauma.  Since that hospitalization he has had no chest discomfort, neck or arm discomfort. However, he doesn't recall the symptoms at that time of his hospitalization though this is reported in the discharge note. He said he had a rapid heart rate. He says this has not recurred. He has had no palpitations, presyncope or syncope. He has had no new shortness of breath, PND or orthopnea. He has been working in his garden. He has been worried about lower extremity swelling.  Allergies  Allergen Reactions  . Codeine     REACTION: intolerant    Current Outpatient Prescriptions  Medication Sig Dispense Refill  . albuterol (PROVENTIL) (2.5 MG/3ML) 0.083% nebulizer solution Take 2.5 mg by nebulization 2 (two) times daily.        Marland Kitchen amLODipine (NORVASC) 5 MG tablet Take 5 mg by mouth 2 (two) times daily.       . Armodafinil (NUVIGIL) 250 MG tablet Take 250 mg by mouth daily. 1/2 tablet       . aspirin 81 MG tablet Take 81 mg by mouth daily.        . bisoprolol (ZEBETA) 10 MG tablet Take 1 tablet (10 mg total) by mouth daily.  30 tablet  11  . budesonide (PULMICORT) 0.25 MG/2ML nebulizer solution Take 2 mLs (0.25 mg total) by nebulization 2 (two) times daily.  120 mL  11  . cetirizine (ZYRTEC) 10 MG tablet Take 10 mg by mouth daily.        . cloNIDine (CATAPRES) 0.1 MG tablet Take 0.1 mg by mouth 3 (three) times daily.       . clopidogrel (PLAVIX) 75 MG tablet Take 1 tablet (75 mg total) by mouth daily.  30 tablet  11  . dextromethorphan-guaiFENesin (MUCINEX DM) 30-600 MG per 12 hr tablet Take 1 tablet by mouth every 12 (twelve) hours.        Marland Kitchen latanoprost (XALATAN) 0.005 % ophthalmic solution Place 1 drop into both eyes daily.        . nitroGLYCERIN (NITROSTAT) 0.4 MG SL tablet Place 0.4 mg under the tongue every 5 (five)  minutes as needed.        . NON FORMULARY Allergy Injection every Friday       . omeprazole (PRILOSEC) 20 MG capsule Take 20 mg by mouth daily.        . potassium chloride SA (K-DUR,KLOR-CON) 20 MEQ tablet Take 1 tablet (20 mEq total) by mouth 2 (two) times daily.  60 tablet  11  . pravastatin (PRAVACHOL) 80 MG tablet Take 80 mg by mouth daily.        . Probiotic Product (PHILLIPS COLON HEALTH PO) Take by mouth daily.        Marland Kitchen tiotropium (SPIRIVA) 18 MCG inhalation capsule Place 18 mcg into inhaler and inhale daily.        Marland Kitchen DISCONTD: aspirin 325 MG tablet Take 325 mg by mouth daily.        Marland Kitchen DISCONTD: HYDROcodone-acetaminophen (VICODIN) 5-500 MG per tablet Take 1 tablet by mouth every 8 (eight) hours as needed for pain (1 TABLET AS NEEDED FOR PAIN AT BRUISE SITE).  20 tablet  0  . DISCONTD: nitroGLYCERIN (NITROSTAT) 0.4 MG SL tablet Place 1 tablet (0.4 mg total) under the tongue every 5 (five) minutes as needed  for chest pain.  100 tablet  3    Past Medical History  Diagnosis Date  . DYSLIPIDEMIA 10/04/2009  . HYPERTENSION 05/10/2007    Echo 3/12: EF 55-60%; mod LVH; mild AS/AI; LAE; PASP 38; mild pulmo HTN  . CORONARY HEART DISEASE 05/10/2007    a. s/p CABG;  b.  cath 06/27/10: S-Dx occluded, S-PDA 80-90% (tx with PCI); S-OM ok, L-LAD ok;  EF 65-70%  c.  s/p Promus DES to S-PDA 06/2010  . FIBRILLATION, ATRIAL 10/04/2009    post op; ?documented during hosp. 06/2010  . ALLERGIC RHINITIS 05/10/2007  . ASTHMA 05/10/2007  . GERD 11/19/2009  . SLEEP APNEA 05/10/2007  . Depression   . Personal history of alcoholism     Past Surgical History  Procedure Date  . Coronary artery bypass graft 02/2007  . Hernia repair 12/03/07  . Tonsillectomy     ROS:  As stated in the HPI and negative for all other systems.  PHYSICAL EXAM BP 148/70  Pulse 80  Resp 16  Ht 5\' 2"  (1.575 m)  Wt 152 lb (68.947 kg)  BMI 27.80 kg/m2 GENERAL:  Well appearing HEENT:  Pupils equal round and reactive, fundi not  visualized, oral mucosa unremarkable, dentures NECK:  No jugular venous distention, waveform within normal limits, carotid upstroke brisk and symmetric, left carotid bruits, no thyromegaly LYMPHATICS:  No cervical, inguinal adenopathy LUNGS:  Clear to auscultation bilaterally BACK:  No CVA tenderness CHEST:  Unremarkable HEART:  PMI not displaced or sustained,S1 and S2 within normal limits, no S3, no S4, no clicks, no rubs, soft apical, right upper sterna border early peaking murmur ABD:  Flat, positive bowel sounds normal in frequency in pitch, no bruits, no rebound, no guarding, no midline pulsatile mass, no hepatomegaly, no splenomegaly EXT:  2 plus pulses throughout, mild bilateral edema, no cyanosis no clubbing SKIN:  No rashes no nodules NEURO:  Cranial nerves II through XII grossly intact, motor grossly intact throughout PSYCH:  Cognitively intact, oriented to person place and time   ASSESSMENT AND PLAN

## 2010-09-06 NOTE — Assessment & Plan Note (Signed)
His blood pressure is controlled and he will continue the meds as listed. Note his blood pressure is slightly high anterior he reports excellent readings at home. I do think the lower extremity swelling may be related to Norvasc but he tolerates this and as long as he knows this is not a sign of heart failure he would prefer to continue the Norvasc. We did discuss salt and fluid restriction. I reviewed his recent echo and he had a well-preserved ejection fraction.

## 2010-09-06 NOTE — Assessment & Plan Note (Signed)
I reviewed his recent lipid profile and LDL of 67 and HDL of 39. I do see that he's had no recent liver enzymes and these will be drawn today. Otherwise he will continue on the meds as listed.

## 2010-09-06 NOTE — Patient Instructions (Addendum)
Please have blood drawn today to check your liver functions Please take the Norvasc/Amlopdipine at bedtime Take all other medications as listed Follow up with Dr Antoine Poche in 6 months

## 2010-09-07 ENCOUNTER — Encounter (HOSPITAL_COMMUNITY): Payer: Medicare Other

## 2010-09-09 ENCOUNTER — Encounter (HOSPITAL_COMMUNITY): Payer: Medicare Other | Attending: Cardiology

## 2010-09-09 DIAGNOSIS — I739 Peripheral vascular disease, unspecified: Secondary | ICD-10-CM | POA: Insufficient documentation

## 2010-09-09 DIAGNOSIS — J45909 Unspecified asthma, uncomplicated: Secondary | ICD-10-CM | POA: Insufficient documentation

## 2010-09-09 DIAGNOSIS — Z5189 Encounter for other specified aftercare: Secondary | ICD-10-CM | POA: Insufficient documentation

## 2010-09-09 DIAGNOSIS — Z7982 Long term (current) use of aspirin: Secondary | ICD-10-CM | POA: Insufficient documentation

## 2010-09-09 DIAGNOSIS — Z9861 Coronary angioplasty status: Secondary | ICD-10-CM | POA: Insufficient documentation

## 2010-09-09 DIAGNOSIS — Z951 Presence of aortocoronary bypass graft: Secondary | ICD-10-CM | POA: Insufficient documentation

## 2010-09-09 DIAGNOSIS — R000 Tachycardia, unspecified: Secondary | ICD-10-CM | POA: Insufficient documentation

## 2010-09-09 DIAGNOSIS — Z7902 Long term (current) use of antithrombotics/antiplatelets: Secondary | ICD-10-CM | POA: Insufficient documentation

## 2010-09-09 DIAGNOSIS — I2510 Atherosclerotic heart disease of native coronary artery without angina pectoris: Secondary | ICD-10-CM | POA: Insufficient documentation

## 2010-09-09 DIAGNOSIS — R0789 Other chest pain: Secondary | ICD-10-CM | POA: Insufficient documentation

## 2010-09-12 ENCOUNTER — Encounter (HOSPITAL_COMMUNITY): Payer: Medicare Other

## 2010-09-14 ENCOUNTER — Encounter (HOSPITAL_COMMUNITY): Payer: Medicare Other

## 2010-09-16 ENCOUNTER — Encounter (HOSPITAL_COMMUNITY): Payer: Medicare Other

## 2010-09-19 ENCOUNTER — Ambulatory Visit: Payer: MEDICARE | Admitting: Internal Medicine

## 2010-09-19 ENCOUNTER — Encounter (HOSPITAL_COMMUNITY): Payer: Medicare Other

## 2010-09-21 ENCOUNTER — Encounter (HOSPITAL_COMMUNITY): Payer: Medicare Other

## 2010-09-23 ENCOUNTER — Encounter (HOSPITAL_COMMUNITY): Payer: Medicare Other

## 2010-09-26 ENCOUNTER — Encounter (HOSPITAL_COMMUNITY): Payer: Medicare Other

## 2010-09-28 ENCOUNTER — Encounter (HOSPITAL_COMMUNITY): Payer: Medicare Other

## 2010-09-30 ENCOUNTER — Encounter (HOSPITAL_COMMUNITY): Payer: Medicare Other

## 2010-10-01 ENCOUNTER — Other Ambulatory Visit: Payer: Self-pay | Admitting: Cardiology

## 2010-10-01 ENCOUNTER — Other Ambulatory Visit: Payer: Self-pay | Admitting: Internal Medicine

## 2010-10-03 ENCOUNTER — Encounter (HOSPITAL_COMMUNITY): Payer: Medicare Other

## 2010-10-05 ENCOUNTER — Encounter (HOSPITAL_COMMUNITY): Payer: Medicare Other

## 2010-10-07 ENCOUNTER — Encounter (HOSPITAL_COMMUNITY): Payer: Medicare Other

## 2010-10-10 ENCOUNTER — Encounter (HOSPITAL_COMMUNITY): Payer: Medicare Other | Attending: Cardiology

## 2010-10-10 ENCOUNTER — Other Ambulatory Visit (INDEPENDENT_AMBULATORY_CARE_PROVIDER_SITE_OTHER): Payer: Medicare Other

## 2010-10-10 ENCOUNTER — Encounter: Payer: Self-pay | Admitting: Internal Medicine

## 2010-10-10 ENCOUNTER — Ambulatory Visit (INDEPENDENT_AMBULATORY_CARE_PROVIDER_SITE_OTHER): Payer: Medicare Other | Admitting: Internal Medicine

## 2010-10-10 VITALS — BP 110/62 | HR 63 | Temp 98.2°F | Ht 62.0 in | Wt 151.4 lb

## 2010-10-10 DIAGNOSIS — I739 Peripheral vascular disease, unspecified: Secondary | ICD-10-CM | POA: Insufficient documentation

## 2010-10-10 DIAGNOSIS — Z7902 Long term (current) use of antithrombotics/antiplatelets: Secondary | ICD-10-CM | POA: Insufficient documentation

## 2010-10-10 DIAGNOSIS — Z7982 Long term (current) use of aspirin: Secondary | ICD-10-CM | POA: Insufficient documentation

## 2010-10-10 DIAGNOSIS — Z87448 Personal history of other diseases of urinary system: Secondary | ICD-10-CM

## 2010-10-10 DIAGNOSIS — E785 Hyperlipidemia, unspecified: Secondary | ICD-10-CM

## 2010-10-10 DIAGNOSIS — I10 Essential (primary) hypertension: Secondary | ICD-10-CM

## 2010-10-10 DIAGNOSIS — D62 Acute posthemorrhagic anemia: Secondary | ICD-10-CM

## 2010-10-10 DIAGNOSIS — Z951 Presence of aortocoronary bypass graft: Secondary | ICD-10-CM | POA: Insufficient documentation

## 2010-10-10 DIAGNOSIS — Z9861 Coronary angioplasty status: Secondary | ICD-10-CM | POA: Insufficient documentation

## 2010-10-10 DIAGNOSIS — J45909 Unspecified asthma, uncomplicated: Secondary | ICD-10-CM | POA: Insufficient documentation

## 2010-10-10 DIAGNOSIS — R000 Tachycardia, unspecified: Secondary | ICD-10-CM | POA: Insufficient documentation

## 2010-10-10 DIAGNOSIS — Z5189 Encounter for other specified aftercare: Secondary | ICD-10-CM | POA: Insufficient documentation

## 2010-10-10 DIAGNOSIS — R0789 Other chest pain: Secondary | ICD-10-CM | POA: Insufficient documentation

## 2010-10-10 DIAGNOSIS — I2510 Atherosclerotic heart disease of native coronary artery without angina pectoris: Secondary | ICD-10-CM | POA: Insufficient documentation

## 2010-10-10 LAB — CBC WITH DIFFERENTIAL/PLATELET
Basophils Absolute: 0 10*3/uL (ref 0.0–0.1)
Eosinophils Absolute: 0.1 10*3/uL (ref 0.0–0.7)
Lymphocytes Relative: 25.2 % (ref 12.0–46.0)
MCHC: 34.2 g/dL (ref 30.0–36.0)
Monocytes Relative: 8.8 % (ref 3.0–12.0)
Neutrophils Relative %: 63.6 % (ref 43.0–77.0)
Platelets: 180 10*3/uL (ref 150.0–400.0)
RDW: 14.4 % (ref 11.5–14.6)

## 2010-10-10 LAB — PSA: PSA: 0.62 ng/mL (ref 0.10–4.00)

## 2010-10-10 NOTE — Assessment & Plan Note (Signed)
Urology recently retired - missed apt with new uro - Requests PSA check here - will check with other labs, follow up with uro as planned

## 2010-10-10 NOTE — Patient Instructions (Addendum)
It was good to see you today. Medications reviewed, no changes at this time. Test(s) ordered today. Your results will be called to you after review (48-72hours after test completion). If any changes need to be made, you will be notified at that time. Please reschedule followup in 4 months, call sooner if problems.

## 2010-10-10 NOTE — Assessment & Plan Note (Signed)
The current medical regimen is effective;  continue present plan and medications. BP Readings from Last 3 Encounters:  10/10/10 110/62  09/06/10 148/70  07/13/10 128/62

## 2010-10-10 NOTE — Assessment & Plan Note (Signed)
On high dose prava - tolerating well, LDL<70 at goal The current medical regimen is effective;  continue present plan and medications.

## 2010-10-10 NOTE — Progress Notes (Signed)
Subjective:    Patient ID: [name], male    DOB: 1930/09/04, 77 y.o.   MRN: [id number]  HPI   here for follow up-  reviewed chronic medical issues today:  CAD s/p CABG 2008 - follows with cards for same - s/p treadmill stress test in 11/2009 -DES to saph v graft on 06/28/10 - course complicated by hematoma, no DVT on ER f/u for pain and swelling -reports compliance with ongoing medical treatment and no changes in medication dose or frequency. denies adverse side effects related to current therapy. no CP or anginal symptoms   HTN - reports compliance with ongoing medical treatment and no changes in medication dose or frequency. denies adverse side effects related to current therapy. no edema or HA or weakness  dyslipidemia - changed from simva to prava 6/2011due to FDA warnings - reports compliance with ongoing medical treatment and no other changes in medication dose or frequency. denies adverse side effects related to current therapy. no muscle pain or weakness  asthma, remote tobacco - follows with pulm for same - frequent flares related to weather change - reports compliance with ongoing nubulizer treatment and no changes in medication dose or frequency. denies adverse side effects related to current therapy.   Past Medical History  Diagnosis Date  . CORONARY HEART DISEASE     a. s/p CABG;  b.  cath 06/27/10: S-Dx occluded, S-PDA 80-90% (tx with PCI); S-OM ok, L-LAD ok;  EF 65-70%  c.  s/p Promus DES to S-PDA 06/2010  . FIBRILLATION, ATRIAL     post op; ?documented during hosp. 06/2010  . SLEEP APNEA 09/2001    NPSG AHI 22/HR  . ALLERGIC RHINITIS   . ASTHMA   . Depression   . GERD   . DYSLIPIDEMIA   . HYPERTENSION     Echo 3/12: EF 55-60%; mod LVH; mild AS/AI; LAE; PASP 38; mild pulmo HTN  . Personal history of alcoholism     Review of Systems  Constitutional: Negative for fever.  Respiratory: Negative for cough.   Cardiovascular: Negative for chest pain.  Genitourinary:  Negative for scrotal swelling.  Neurological: Negative for dizziness, weakness, numbness and headaches.      Objective:   Physical Exam  BP 110/62  Pulse 63  Temp(Src) 98.2 F (36.8 C) (Oral)  Ht 5\' 2"  (1.575 m)  Wt 151 lb 6.4 oz (68.675 kg)  BMI 27.69 kg/m2  SpO2 98%  Physical Exam  Constitutional:  oriented to person, place, and time. appears well-developed and well-nourished. No distress.  Cardiovascular: Normal rate, irregular rhythm and normal heart sounds.  No murmur heard. No BLE edema Pulmonary/Chest: Effort normal and breath sounds normal. No respiratory distress. no wheezes.  Neurological: he is alert and oriented to person, place, and time. No cranial nerve deficit. Coordination normal.   Psychiatric: he has a normal mood and affect. behavior is normal. Judgment and thought content normal.     Lab Results  Component Value Date   WBC 6.7 07/11/2010   HGB 9.9* 07/11/2010   HCT 28.4* 07/11/2010   PLT 335.0 07/11/2010   CHOL  Value: 113        ATP III CLASSIFICATION:  <200     mg/dL   Desirable  409-811  mg/dL   Borderline High  >=914    mg/dL   High        7/82/9562   TRIG 35 06/25/2010   HDL 39* 06/25/2010   ALT 13 09/06/2010  AST 15 09/06/2010   NA 134* 07/03/2010   K 4.0 07/11/2010   CL 102 07/03/2010   CREATININE 0.77 07/03/2010   BUN 12 07/03/2010   CO2 27 07/03/2010   TSH 1.718 06/25/2010   INR 1.00 07/03/2010   Assessment & Plan:  See problem list. Medications and labs reviewed today.  Anemia, ABL following hematoma with cath 06/2010 - recheck CBC now

## 2010-10-12 ENCOUNTER — Encounter (HOSPITAL_COMMUNITY): Payer: Medicare Other

## 2010-10-14 ENCOUNTER — Encounter (HOSPITAL_COMMUNITY): Payer: Medicare Other

## 2010-10-14 ENCOUNTER — Other Ambulatory Visit: Payer: Self-pay | Admitting: *Deleted

## 2010-10-14 MED ORDER — ALBUTEROL SULFATE (2.5 MG/3ML) 0.083% IN NEBU: 2.5000 mg | INHALATION_SOLUTION | Freq: Two times a day (BID) | RESPIRATORY_TRACT | Status: DC

## 2010-10-17 ENCOUNTER — Encounter (HOSPITAL_COMMUNITY): Payer: Medicare Other

## 2010-10-19 ENCOUNTER — Encounter (HOSPITAL_COMMUNITY): Payer: Medicare Other

## 2010-10-21 ENCOUNTER — Encounter (HOSPITAL_COMMUNITY): Payer: Medicare Other

## 2010-10-24 ENCOUNTER — Encounter (HOSPITAL_COMMUNITY): Payer: Medicare Other

## 2010-10-26 ENCOUNTER — Encounter (HOSPITAL_COMMUNITY): Payer: Medicare Other

## 2010-10-28 ENCOUNTER — Encounter (HOSPITAL_COMMUNITY): Payer: Medicare Other

## 2010-11-16 ENCOUNTER — Ambulatory Visit (INDEPENDENT_AMBULATORY_CARE_PROVIDER_SITE_OTHER): Payer: Medicare Other | Admitting: Internal Medicine

## 2010-11-16 ENCOUNTER — Encounter: Payer: Self-pay | Admitting: Internal Medicine

## 2010-11-16 VITALS — BP 120/78 | HR 65 | Ht 62.0 in | Wt 153.6 lb

## 2010-11-16 DIAGNOSIS — G4730 Sleep apnea, unspecified: Secondary | ICD-10-CM

## 2010-11-16 DIAGNOSIS — J309 Allergic rhinitis, unspecified: Secondary | ICD-10-CM

## 2010-11-16 DIAGNOSIS — J45909 Unspecified asthma, uncomplicated: Secondary | ICD-10-CM

## 2010-11-16 MED ORDER — MODAFINIL 200 MG PO TABS: 200.0000 mg | ORAL_TABLET | Freq: Every day | ORAL | Status: DC

## 2010-11-16 NOTE — Assessment & Plan Note (Signed)
Good compliance and control with CPAP We are Rxing some Provigil for occasional use while driving.

## 2010-11-16 NOTE — Patient Instructions (Signed)
Script for Provigil  Continue CPAP Continue Allergy vaccine

## 2010-11-16 NOTE — Progress Notes (Signed)
Subjective:    Patient ID: [name], male    DOB: Feb 05, 1931, 77 y.o.   MRN: [id number]  HPI 11/16/10- 77 year old man followed for severe chronic asthma, sleep apnea, allergic rhinitis, complicated by HBP CAD history of atrial fibrillation, GERD Last here May 20, 2010 CPAP 10 all night every night- helps sleep. Asks refill Provigil to help with long drives.  Allergy vaccine GO definitely helps- he is convinced.  He notes no asthma and no prednisone since last November- credits frequent handwashing, avoiding people with colds and the allergy vaccine. No new concerns. Continues cardiac rehab.   Review of Systems Constitutional:   No-   weight loss, night sweats, fevers, chills, fatigue, lassitude. HEENT:   No-  headaches, difficulty swallowing, tooth/dental problems, sore throat,       No-  sneezing, itching, ear ache, nasal congestion, post nasal drip,  CV:  No-   chest pain, orthopnea, PND, swelling in lower extremities, anasarca, dizziness, palpitations Resp: No-   shortness of breath with exertion or at rest.              No-   productive cough,  No non-productive cough,  No-  coughing up of blood.              No-   change in color of mucus.  No- wheezing.   Skin: No-   rash or lesions. GI:  No-   heartburn, indigestion, abdominal pain, nausea, vomiting, diarrhea,                 change in bowel habits, loss of appetite GU: No-   dysuria, change in color of urine, no urgency or frequency.  No- flank pain. MS:  No-   joint pain or swelling.  No- decreased range of motion.  No- back pain. Neuro- grossly normal to observation, Or:  Psych:  No- change in mood or affect. No depression or anxiety.  No memory loss.       Objective:   Physical Exam General- Alert, Oriented, Affect-appropriate, Distress- none acute Skin- rash-none, lesions- none, excoriation- none   Old tatoos Lymphadenopathy- none Head- atraumatic            Eyes- Gross vision intact, PERRLA, conjunctivae  clear secretions            Ears- Hearing, canals- hearing aid            Nose- Clear, No- Septal dev, mucus, polyps, erosion, perforation             Throat- Mallampati II , mucosa clear , drainage- none, tonsils- atrophic Neck- flexible , trachea midline, no stridor , thyroid nl, carotid no bruit Chest - symmetrical excursion , unlabored           Heart/CV- RRR , no murmur , no gallop  , no rub, nl s1 s2                           - JVD- none , edema- none, stasis changes- none, varices- none           Lung- clear to P&A, wheeze- none, cough- none , dullness-none, rub- none           Chest wall-  Abd- tender-no, distended-no, bowel sounds-present, HSM- no Br/ Gen/ Rectal- Not done, not indicated Extrem- cyanosis- none, clubbing, none, atrophy- none, strength- nl Neuro- grossly intact to observation         Assessment &  Plan:

## 2010-11-16 NOTE — Assessment & Plan Note (Signed)
Good control with no changes needed 

## 2010-11-16 NOTE — Assessment & Plan Note (Addendum)
Continues allergy vaccine and is convinced that it helps him.

## 2010-11-23 ENCOUNTER — Ambulatory Visit (INDEPENDENT_AMBULATORY_CARE_PROVIDER_SITE_OTHER): Payer: Medicare Other

## 2010-11-23 DIAGNOSIS — J309 Allergic rhinitis, unspecified: Secondary | ICD-10-CM

## 2010-12-16 ENCOUNTER — Emergency Department (HOSPITAL_COMMUNITY): Payer: Medicare Other

## 2010-12-16 ENCOUNTER — Inpatient Hospital Stay (HOSPITAL_COMMUNITY)
Admission: EM | Admit: 2010-12-16 | Discharge: 2010-12-17 | DRG: 392 | Disposition: A | Discharge status: Home / Self Care | Payer: Medicare Other | Attending: Cardiology | Admitting: Cardiology

## 2010-12-16 DIAGNOSIS — I2510 Atherosclerotic heart disease of native coronary artery without angina pectoris: Secondary | ICD-10-CM | POA: Diagnosis present

## 2010-12-16 DIAGNOSIS — I4891 Unspecified atrial fibrillation: Secondary | ICD-10-CM | POA: Diagnosis present

## 2010-12-16 DIAGNOSIS — I10 Essential (primary) hypertension: Secondary | ICD-10-CM | POA: Diagnosis present

## 2010-12-16 DIAGNOSIS — J449 Chronic obstructive pulmonary disease, unspecified: Secondary | ICD-10-CM | POA: Diagnosis present

## 2010-12-16 DIAGNOSIS — Z9861 Coronary angioplasty status: Secondary | ICD-10-CM

## 2010-12-16 DIAGNOSIS — K319 Disease of stomach and duodenum, unspecified: Principal | ICD-10-CM | POA: Diagnosis present

## 2010-12-16 DIAGNOSIS — Z7902 Long term (current) use of antithrombotics/antiplatelets: Secondary | ICD-10-CM

## 2010-12-16 DIAGNOSIS — I25810 Atherosclerosis of coronary artery bypass graft(s) without angina pectoris: Secondary | ICD-10-CM | POA: Diagnosis present

## 2010-12-16 DIAGNOSIS — Z7982 Long term (current) use of aspirin: Secondary | ICD-10-CM

## 2010-12-16 DIAGNOSIS — E785 Hyperlipidemia, unspecified: Secondary | ICD-10-CM | POA: Diagnosis present

## 2010-12-16 DIAGNOSIS — J4489 Other specified chronic obstructive pulmonary disease: Secondary | ICD-10-CM | POA: Diagnosis present

## 2010-12-16 DIAGNOSIS — G4733 Obstructive sleep apnea (adult) (pediatric): Secondary | ICD-10-CM | POA: Diagnosis present

## 2010-12-16 DIAGNOSIS — R079 Chest pain, unspecified: Secondary | ICD-10-CM

## 2010-12-16 DIAGNOSIS — Z87891 Personal history of nicotine dependence: Secondary | ICD-10-CM

## 2010-12-16 DIAGNOSIS — R109 Unspecified abdominal pain: Secondary | ICD-10-CM

## 2010-12-16 DIAGNOSIS — I739 Peripheral vascular disease, unspecified: Secondary | ICD-10-CM | POA: Diagnosis present

## 2010-12-16 LAB — CBC
MCH: 29.4 pg (ref 26.0–34.0)
MCV: 84.8 fL (ref 78.0–100.0)
Platelets: 170 10*3/uL (ref 150–400)
RBC: 4.46 MIL/uL (ref 4.22–5.81)
RDW: 14.1 % (ref 11.5–15.5)
WBC: 7.6 10*3/uL (ref 4.0–10.5)

## 2010-12-16 LAB — BASIC METABOLIC PANEL
CO2: 30 mEq/L (ref 19–32)
Calcium: 9.4 mg/dL (ref 8.4–10.5)
Chloride: 104 mEq/L (ref 96–112)
Creatinine, Ser: 0.79 mg/dL (ref 0.50–1.35)
GFR calc Af Amer: >60 mL/min (ref >60)
Sodium: 140 mEq/L (ref 135–145)

## 2010-12-16 LAB — HEPARIN LEVEL (UNFRACTIONATED): Heparin Unfractionated: 0.25 IU/mL — ABNORMAL LOW (ref 0.30–0.70)

## 2010-12-16 LAB — CARDIAC PANEL(CRET KIN+CKTOT+MB+TROPI): CK, MB: 2.7 ng/mL (ref 0.3–4.0)

## 2010-12-16 LAB — PROTIME-INR: INR: 1 (ref 0.00–1.49)

## 2010-12-16 LAB — CK TOTAL AND CKMB (NOT AT ARMC)
CK, MB: 3 ng/mL (ref 0.3–4.0)
Relative Index: INVALID (ref 0.0–2.5)

## 2010-12-16 LAB — POCT I-STAT TROPONIN I: Troponin i, poc: 0 ng/mL (ref 0.00–0.08)

## 2010-12-16 LAB — APTT: aPTT: 29 seconds (ref 24–37)

## 2010-12-17 ENCOUNTER — Inpatient Hospital Stay (HOSPITAL_COMMUNITY): Payer: Medicare Other

## 2010-12-17 LAB — COMPREHENSIVE METABOLIC PANEL
ALT: 13 U/L (ref 0–53)
AST: 15 U/L (ref 0–37)
Albumin: 3.7 g/dL (ref 3.5–5.2)
Alkaline Phosphatase: 77 U/L (ref 39–117)
CO2: 29 mEq/L (ref 19–32)
Chloride: 108 mEq/L (ref 96–112)
Potassium: 3.7 mEq/L (ref 3.5–5.1)
Sodium: 144 mEq/L (ref 135–145)
Total Bilirubin: 0.3 mg/dL (ref 0.3–1.2)

## 2010-12-17 LAB — CARDIAC PANEL(CRET KIN+CKTOT+MB+TROPI)
Relative Index: INVALID (ref 0.0–2.5)
Troponin I: <0.3 ng/mL (ref <0.30)

## 2010-12-17 LAB — CBC
Hemoglobin: 13.2 g/dL (ref 13.0–17.0)
MCH: 28.8 pg (ref 26.0–34.0)
MCHC: 33.5 g/dL (ref 30.0–36.0)
MCV: 85.8 fL (ref 78.0–100.0)
RBC: 4.59 MIL/uL (ref 4.22–5.81)

## 2010-12-17 LAB — LIPID PANEL
LDL Cholesterol: 59 mg/dL (ref 0–99)
Triglycerides: 99 mg/dL (ref <150)
VLDL: 20 mg/dL (ref 0–40)

## 2010-12-17 MED ORDER — TECHNETIUM TC 99M TETROFOSMIN IV KIT
10.0000 | PACK | Freq: Once | INTRAVENOUS | Status: AC | PRN
  Administered 2010-12-17: 10 via INTRAVENOUS

## 2010-12-17 MED ORDER — TECHNETIUM TC 99M TETROFOSMIN IV KIT
30.0000 | PACK | Freq: Once | INTRAVENOUS | Status: AC | PRN
  Administered 2010-12-17: 30 via INTRAVENOUS

## 2010-12-19 NOTE — Discharge Summary (Signed)
NAMEKARO, ROG                 ACCOUNT NO.:  [id number]  MEDICAL RECORD NO.:  [id number]  LOCATION:  3735                         FACILITY:  [facility]  PHYSICIAN:  Madolyn Frieze. Jens Som, MD, FACCDATE OF BIRTH:  1930/07/12  DATE OF ADMISSION:  12/16/2010 DATE OF DISCHARGE:  12/17/2010                              DISCHARGE SUMMARY   PRIMARY CARDIOLOGIST:  Rollene Rotunda, MD, [facility]  PRIMARY CARE PHYSICIAN:  Raenette Rover. Felicity Coyer, MD  REASON FOR ADMISSION:  Chest pain.  DISCHARGE DIAGNOSES: 1. Chest pain, etiology unclear (possibly GI mediated) 2. Coronary artery disease.     a.     Low risk Myoview scan this admission.     b.     Status post bypass 2008.     c.     Status post percutaneous coronary intervention March 2012,      with placement of a drug-eluting stent to the vein graft to      posterolateral artery. 3. Hypertension. 4. Hyperlipidemia. 5. History of atrial fibrillation. 6. Ex-smoker. 7. Asthma/chronic obstructive pulmonary disease. 8. Obstructive sleep apnea. 9. Peripheral arterial disease.  ADMISSION HISTORY:  [name] is a 77 year old male patient with history of CAD status post prior bypass surgery and recent stenting to the vein graft in posterolateral in March 2012.  The patient is quite active and works out at [employer] 3-4 times a week.  He had recent abdominal discomfort and intermittent diarrhea.  On the morning of admission, he awoke in the morning with diarrhea and substernal chest heaviness similar to his prior angina.  He took baby aspirin and nitroglycerin without relief.  He had several bowel movements and eventually came to the emergency room via EMS.  He had 40 total minutes of symptoms. Cardiac markers were negative.  He was admitted for further evaluation and treatment.  HOSPITAL COURSE:  The patient ruled out for myocardial infarction by enzymes.  He was set up for inpatient Myoview study which was done on December 17, 2010.  The patient  exercised for 8 minutes and 5 seconds without chest discomfort or significant EKG changes.  His nuclear images demonstrated mild reversibility in anterior apex, anterior wall and anterolateral wall with an EF of 69%.  Dr. Jens Som reviewed the images and felt that this represented minimal apical ischemia.  It was felt to be overall low risk study.  It was felt that the patient could be discharged home in stable condition.  LABORATORY AND DIAGNOSTIC DATA:  Hemoglobin 13.2, potassium 3.7, creatinine 0.79.  LFTs okay.  Cardiac markers negative x3.  Total cholesterol 114, triglycerides 99, HDL 35, LDL 59.  Myoview study as outlined above.  Chest x-ray on December 16, 2010, atelectasis or early infiltrate in the lingula status post CABG, no pulmonary edema.  DISCHARGE MEDICATIONS: 1. Albuterol nebulizer 2.5 mg twice daily. 2. Amlodipine 5 mg twice daily. 3. Aspirin 81 mg daily. 4. Bisoprolol 10 mg daily. 5. Budesonide nebulizer 0.25 mg per 2 mL twice daily. 6. Clonidine 0.3 mg twice daily. 7. Clopidogrel 75 mg daily. 8. Klor-Con 20 mEq twice daily. 9. Nitroglycerin p.r.n. chest pain. 10.Pravastatin 80 mg at bedtime. 11.Prilosec 20 mg daily. 12.Pro-Air 2  puffs every 4 hours as needed. 13.Phillips' Colon Health 1 tablet daily.  ACTIVITIES:  Increase activity slowly.  DIET:  Low-fat, low-sodium diet.  WOUND CARE:  Not applicable.  FOLLOWUP:  Will be with Dr. Antoine Poche or the physician assistant in 2 weeks and the office will contact him with an appointment.  Total physician PA time greater than 30 minutes since discharge.     Tereso Newcomer, PA-C   ______________________________ Madolyn Frieze. Jens Som, MD, [facility]    SW/MEDQ  D:  12/17/2010  T:  12/17/2010  Job:  [id number]  cc:   Rollene Rotunda, MD, [facility] Valerie A. Felicity Coyer, MD  Electronically Signed by Tereso Newcomer PA-C on 12/18/2010 04:30:04 PM Electronically Signed by Olga Millers MD [facility] on 12/19/2010 07:31:05 PM

## 2010-12-26 ENCOUNTER — Encounter: Payer: Self-pay | Admitting: Physician Assistant

## 2011-01-02 ENCOUNTER — Telehealth: Payer: Self-pay | Admitting: Internal Medicine

## 2011-01-02 ENCOUNTER — Telehealth: Payer: Self-pay | Admitting: Cardiology

## 2011-01-02 ENCOUNTER — Ambulatory Visit (INDEPENDENT_AMBULATORY_CARE_PROVIDER_SITE_OTHER): Payer: Medicare Other | Admitting: Physician Assistant

## 2011-01-02 ENCOUNTER — Encounter: Payer: Self-pay | Admitting: Physician Assistant

## 2011-01-02 VITALS — BP 128/79 | HR 59 | Resp 18 | Ht 62.0 in | Wt 151.8 lb

## 2011-01-02 DIAGNOSIS — I2510 Atherosclerotic heart disease of native coronary artery without angina pectoris: Secondary | ICD-10-CM

## 2011-01-02 DIAGNOSIS — B9789 Other viral agents as the cause of diseases classified elsewhere: Secondary | ICD-10-CM

## 2011-01-02 DIAGNOSIS — B349 Viral infection, unspecified: Secondary | ICD-10-CM

## 2011-01-02 DIAGNOSIS — R197 Diarrhea, unspecified: Secondary | ICD-10-CM | POA: Insufficient documentation

## 2011-01-02 DIAGNOSIS — I10 Essential (primary) hypertension: Secondary | ICD-10-CM

## 2011-01-02 DIAGNOSIS — E785 Hyperlipidemia, unspecified: Secondary | ICD-10-CM

## 2011-01-02 NOTE — Assessment & Plan Note (Signed)
LDL optimal in the hospital

## 2011-01-02 NOTE — Assessment & Plan Note (Addendum)
Stable.  No further chest pain.  Continue ASA, plavix and statin.  Follow up with Dr. Antoine Poche in 3 mos.

## 2011-01-02 NOTE — Assessment & Plan Note (Signed)
We discussed his symptoms possibly being from Plavix.  I considered changing him to Effient, but upon discussion with Dr. Riley Kill, he is not a good candidate for Effient (age over 55).  He is 6 months out from his PCI.  I discussed with Dr. Antoine Poche, his primary cardiologist, whether we could stop the Plavix.  I am going to have him seen in GI for further evaluation for his diarrhea.  Dr. Antoine Poche prefers for him to have GI workup first, then decide if we should consider stopping his Plavix.  Therefore, he will continue Plavix for now.    Of note, he had stool culture and stool for O. And P. As well as C. Diff performed at urgent care.  These were all negative.  Tissue transglutaminase was also negative fecal occult test was negative for blood.  Hemoglobin was 14.1 and white count 5600.

## 2011-01-02 NOTE — Telephone Encounter (Signed)
Pt scheduled to see Dr. Marina Goodell 01/03/11@11 :15am. Pt aware of appt date and time.

## 2011-01-02 NOTE — Telephone Encounter (Signed)
Pt wife calling asking to speak w/ nurse. Pt wife said there was a mix up w/ pt clonidine and pt wife wanted to get it straight w/ nurse.

## 2011-01-02 NOTE — Patient Instructions (Signed)
You have been referred to DR. PERRY GI THIS WEEK FOR DIARRHEA PER SCOTT WEAVER, PA-C  NO OTHER CHANGES AT THIS TIME

## 2011-01-02 NOTE — Telephone Encounter (Signed)
Per wife  Pt is taking Clonidine 0.3 mg BID.  Medication list appears to have been corrected.

## 2011-01-02 NOTE — Assessment & Plan Note (Signed)
Controlled.  

## 2011-01-02 NOTE — Progress Notes (Signed)
History of Present Illness: Primary Cardiologist:  Dr. Rollene Rotunda   [name] is a 77 y.o. male who presents for post hospital follow up.  He has a h/o CAD, s/p CABG and recent DES to the SVG-PDA in 3/12 c/b by a right groin hematoma, HTN, HLP, COPD and PAD.  He was admitted 9/7-9/8 with chest pain.  He had chest pain in the setting of abdominal discomfort and and diarrhea.  His markers were negative for acute MI.  Inpatient Myoview demonstrated mild reversibility in anterior apex, anterior wall, anterolateral wall and EF 69%.  Dr. Jens Som reviewed the images and felt they were low risk.  Pertinent labs:  Hgb 13.2, K 3.7, creatinine 0.79, TC 114, TG 99, HDL 35, LDL 59.  CXR: ATX vs early infiltrate in the lingula, s/p CABG, no edema.    Doing ok.  No further chest pain or shortness of breath.  No syncope.  Sleeps on 2 pillows.  No PND.  Has some dependent edema that resolves with lying supine.  He complains of intermittent diarrhea since his PCI in March.  He sometimes has several trips to the restroom.  He went to urgent care last week for diarrhea and they drew labs, took a stool sample and did some xrays.  He had some chills a couple nights ago.  No recorded fever.  No dyspnea, cough.  He felt poorly the next day, but has felt fine since.    Past Medical History  Diagnosis Date  . CORONARY HEART DISEASE     a. s/p CABG;  b.  cath 06/27/10: S-Dx occluded, S-PDA 80-90% (tx with PCI); S-OM ok, L-LAD ok;  EF 65-70%  c.  s/p Promus DES to S-PDA 06/2010  . FIBRILLATION, ATRIAL     post op; ?documented during hosp. 06/2010  . SLEEP APNEA 09/2001    NPSG AHI 22/HR  . ALLERGIC RHINITIS   . ASTHMA   . Depression   . GERD   . DYSLIPIDEMIA   . HYPERTENSION     Echo 3/12: EF 55-60%; mod LVH; mild AS/AI; LAE; PASP 38; mild pulmo HTN  . Personal history of alcoholism     Current Outpatient Prescriptions  Medication Sig Dispense Refill  . albuterol (PROVENTIL) (2.5 MG/3ML) 0.083% nebulizer  solution Take 3 mLs (2.5 mg total) by nebulization 2 (two) times daily. DX:  493.90  180 mL  5  . amLODipine (NORVASC) 5 MG tablet take 1 tablet by mouth twice a day  60 tablet  5  . aspirin 81 MG tablet Take 81 mg by mouth daily.        . bisoprolol (ZEBETA) 10 MG tablet Take 1 tablet (10 mg total) by mouth daily.  30 tablet  11  . budesonide (PULMICORT) 0.25 MG/2ML nebulizer solution Take 2 mLs (0.25 mg total) by nebulization 2 (two) times daily.  120 mL  11  . cetirizine (ZYRTEC) 10 MG tablet Take 10 mg by mouth daily.        . cloNIDine (CATAPRES) 0.3 MG tablet Take 0.3 mg by mouth. Take 1 tablet AM and 2 tablets PM       . clopidogrel (PLAVIX) 75 MG tablet Take 1 tablet (75 mg total) by mouth daily.  30 tablet  11  . latanoprost (XALATAN) 0.005 % ophthalmic solution Place 1 drop into both eyes daily.        . nitroGLYCERIN (NITROSTAT) 0.4 MG SL tablet Place 0.4 mg under the tongue every 5 (five)  minutes as needed.        . NON FORMULARY Allergy Injection every Friday       . omeprazole (PRILOSEC) 20 MG capsule Take 20 mg by mouth daily.        . potassium chloride SA (K-DUR,KLOR-CON) 20 MEQ tablet Take 1 tablet (20 mEq total) by mouth 2 (two) times daily.  60 tablet  11  . pravastatin (PRAVACHOL) 80 MG tablet take 1 tablet by mouth every evening  30 tablet  7  . Probiotic Product (PHILLIPS COLON HEALTH PO) Take by mouth daily.          Allergies: Allergies  Allergen Reactions  . Codeine     REACTION: intolerant    Vital Signs: BP 128/79  Pulse 59  Resp 18  Ht 5\' 2"  (1.575 m)  Wt 151 lb 12.8 oz (68.856 kg)  BMI 27.76 kg/m2  PHYSICAL EXAM: Well nourished, well developed, in no acute distress HEENT: normal Neck: no JVD Cardiac:  normal S1, S2; RRR; no murmur Lungs:  Decreased breath sounds bilaterally, no wheezing, rhonchi or rales Abd: soft, nontender, no hepatomegaly Ext: no edema Skin: warm and dry Neuro:  CNs 2-12 intact, no focal abnormalities noted  EKG:  Sinus  bradycardia, HR 59, leftward axis, RSR prime V1, NSSTTW changes, no significant change when compared with prior tracing  ASSESSMENT AND PLAN:

## 2011-01-02 NOTE — Assessment & Plan Note (Signed)
He had intense chills a few nights ago.  He felt bad for one day and now feels back to baseline.  I suspect he had a viral illness.  If this recurs, he knows to seek immediate attention.

## 2011-01-03 ENCOUNTER — Ambulatory Visit (INDEPENDENT_AMBULATORY_CARE_PROVIDER_SITE_OTHER): Payer: Medicare Other | Admitting: Internal Medicine

## 2011-01-03 ENCOUNTER — Encounter: Payer: Self-pay | Admitting: Internal Medicine

## 2011-01-03 VITALS — BP 138/78 | HR 69 | Ht 62.0 in | Wt 150.0 lb

## 2011-01-03 DIAGNOSIS — I2510 Atherosclerotic heart disease of native coronary artery without angina pectoris: Secondary | ICD-10-CM

## 2011-01-03 DIAGNOSIS — K219 Gastro-esophageal reflux disease without esophagitis: Secondary | ICD-10-CM

## 2011-01-03 DIAGNOSIS — R197 Diarrhea, unspecified: Secondary | ICD-10-CM

## 2011-01-03 MED ORDER — METRONIDAZOLE 250 MG PO TABS: 250.0000 mg | ORAL_TABLET | Freq: Four times a day (QID) | ORAL | Status: AC

## 2011-01-03 NOTE — Patient Instructions (Signed)
Metronidazole sent to your pharmacy Continue taking Imodium as needed. Follow-up with Dr. Marina Goodell in 4-5 weeks.

## 2011-01-03 NOTE — Progress Notes (Signed)
HISTORY OF PRESENT ILLNESS:  [name] is a 77 y.o. male with multiple significant medical problems as listed below. She is sent today regarding chronic diarrhea. He is accompanied by his wife. I last saw the patient in July of 2009, as an inpatient, when he underwent colonoscopy and upper endoscopy to evaluate Hemoccult-positive stool. Upper endoscopy was normal except for the presence of a benign esophageal stricture and a hiatal hernia. He does have GERD for which he is on PPI chronically. Colonoscopy revealed left-sided diverticulosis, but was otherwise normal. He also underwent evaluation for abnormal hepatic imaging found to be benign liver cysts. He was hospitalized in March with active cardiac issues. He is status post CABG previously. During his March hospitalization he underwent coronary artery stent placement. Since that time he has had problems with loose bowels. No problems prior. Plavix was a new medication started at the time of his hospitalization. He denies other new medications. No change in diet. No weight loss. No bleeding. He describes loose stools most days. May have as many as 8. Not exacerbated by meals. There has been a nocturnal component as well as some incontinence, especially with Valsalva. He has taken Pepto-Bismol, which he states helps. He will have an occasional day where he does not have a bowel movement. No constipation. He has had no other. Therapies. He was evaluated at urgent care last week. CBC was normal. Stool studies for Clostridium difficile toxin, ova and parasite, and enteric pathogens were normal. Comprehensive metabolic panel was normal except for a mildly elevated glucose. Tissue transglutaminase antibody, IgA was normal. Thyroid stimulating hormone was normal. Stool was checked for occult blood and returned negative. He reports insignificant abdominal cramping, at times. He was evaluated by his cardiology team yesterday. They requested, and set up, this  consultation.  REVIEW OF SYSTEMS:  All non-GI ROS negative except for hearing problems, cough, shortness of breath, insomnia, ankle edema  Past Medical History  Diagnosis Date  . CORONARY HEART DISEASE     a. s/p CABG;  b.  cath 06/27/10: S-Dx occluded, S-PDA 80-90% (tx with PCI); S-OM ok, L-LAD ok;  EF 65-70%  c.  s/p Promus DES to S-PDA 06/2010;   d. Myoview 8/12: low risk  . FIBRILLATION, ATRIAL     post op; ?documented during hosp. 06/2010  . SLEEP APNEA 09/2001    NPSG AHI 22/HR  . ALLERGIC RHINITIS   . ASTHMA   . Depression   . GERD   . DYSLIPIDEMIA   . HYPERTENSION     Echo 3/12: EF 55-60%; mod LVH; mild AS/AI; LAE; PASP 38; mild pulmo HTN  . Personal history of alcoholism     Past Surgical History  Procedure Date  . Coronary artery bypass graft 02/2007  . Hernia repair 12/03/07  . Tonsillectomy   . Coronary angioplasty with stent placement 07/2010    Social History [name]  reports that he quit smoking about 31 years ago. His smoking use included Cigarettes. He does not have any smokeless tobacco history on file. He reports that he does not drink alcohol or use illicit drugs.  family history includes Asthma in his sister; COPD in his sister; and Emphysema in his sister.  Allergies  Allergen Reactions  . Codeine     REACTION: intolerant       PHYSICAL EXAMINATION: Vital signs: BP 138/78  Pulse 69  Ht 5\' 2"  (1.575 m)  Wt 150 lb (68.04 kg)  BMI 27.44 kg/m2  SpO2  98%  Constitutional: generally well-appearing, no acute distress Psychiatric: alert and oriented x3, cooperative Eyes: extraocular movements intact, anicteric, conjunctiva pink Mouth: oral pharynx moist, no lesions Neck: supple no lymphadenopathy Cardiovascular: heart regular rate and rhythm, no murmur Lungs: clear to auscultation bilaterally Abdomen: soft, nontender, nondistended, no obvious ascites, no peritoneal signs, normal bowel sounds, no organomegaly Rectal: Deferred. Recent stool  studies negative for occult blood Extremities: no lower extremity edema bilaterally Skin: no lesions on visible extremities Neuro: No focal deficits.   ASSESSMENT:  #1. Chronic unchanged problems with loose stools and occasional incontinence since March 2012. Coincident with hospitalization for acute cardiac issues requiring coronary artery stent placement and medication change (Plavix added). No worrisome features such as weight loss or bleeding. Laboratories as well as multiple stool studies unremarkable. No obvious infection, metabolic disturbance, or celiac disease. Possible causes include medication reaction, microscopic colitis, bacterial overgrowth. #2. GERD with peptic stricture. Asymptomatic on PPI   PLAN:  #1. Empiric trial of metronidazole 250 mg 4 times a day x10 days #2. Imodium as needed for diarrhea. #3. Routine GI followup in one month (when the patient returns from his one month vacation). If ongoing problems at that time, consider colonoscopy with biopsies to rule out microscopic colitis, empiric treatment with pancreatic enzymes, or possibly holding Plavix as this can cause diarrhea. However, would not hold drug therapy without discussing with cardiology

## 2011-01-06 LAB — BASIC METABOLIC PANEL
BUN: 10
Calcium: 8.4
Chloride: 95 — ABNORMAL LOW
GFR calc Af Amer: >60
GFR calc non Af Amer: >60
Glucose, Bld: 119 — ABNORMAL HIGH
Potassium: 3.3 — ABNORMAL LOW
Sodium: 140

## 2011-01-06 LAB — COMPREHENSIVE METABOLIC PANEL
AST: 377 — ABNORMAL HIGH
BUN: 5 — ABNORMAL LOW
CO2: 28
Calcium: 8.3 — ABNORMAL LOW
Chloride: 99
Creatinine, Ser: 0.77
GFR calc non Af Amer: >60
Glucose, Bld: 107 — ABNORMAL HIGH
Total Bilirubin: 1.2

## 2011-01-06 LAB — HEPATIC FUNCTION PANEL
AST: 180 — ABNORMAL HIGH
AST: 628 — ABNORMAL HIGH
Albumin: 2.1 — ABNORMAL LOW
Bilirubin, Direct: 0.2
Bilirubin, Direct: 0.5 — ABNORMAL HIGH
Total Protein: 5.1 — ABNORMAL LOW
Total Protein: 5.4 — ABNORMAL LOW

## 2011-01-06 LAB — CARDIAC PANEL(CRET KIN+CKTOT+MB+TROPI)
CK, MB: 1.7
CK, MB: 1.8
CK, MB: 2
Relative Index: INVALID
Relative Index: INVALID
Total CK: 23
Troponin I: 0.01

## 2011-01-06 LAB — CBC
HCT: 39.2
HCT: 40.3
MCHC: 33.5
MCV: 90.1
Platelets: 386
RBC: 4.48
WBC: 7.9
WBC: 8.5

## 2011-01-06 LAB — PROTIME-INR: INR: 1.2

## 2011-01-06 LAB — SEDIMENTATION RATE: Sed Rate: 33 — ABNORMAL HIGH

## 2011-01-06 LAB — MAGNESIUM: Magnesium: 1.8

## 2011-01-06 LAB — B-NATRIURETIC PEPTIDE (CONVERTED LAB): Pro B Natriuretic peptide (BNP): 67

## 2011-01-06 LAB — OCCULT BLOOD X 1 CARD TO LAB, STOOL: Fecal Occult Bld: NEGATIVE

## 2011-01-06 LAB — TSH: TSH: 0.485

## 2011-01-06 LAB — APTT: aPTT: 30

## 2011-01-17 LAB — CBC
HCT: 30.8 — ABNORMAL LOW
HCT: 31.1 — ABNORMAL LOW
HCT: 33.1 — ABNORMAL LOW
HCT: 35.6 — ABNORMAL LOW
HCT: 37.4 — ABNORMAL LOW
HCT: 38.8 — ABNORMAL LOW
HCT: 40.3
Hemoglobin: 10.8 — ABNORMAL LOW
Hemoglobin: 10.9 — ABNORMAL LOW
Hemoglobin: 11.6 — ABNORMAL LOW
Hemoglobin: 12.2 — ABNORMAL LOW
Hemoglobin: 12.7 — ABNORMAL LOW
Hemoglobin: 13.4
Hemoglobin: 14
MCHC: 33.9
MCHC: 34.4
MCHC: 34.7
MCHC: 34.7
MCHC: 34.9
MCHC: 34.9
MCHC: 35
MCV: 89.8
MCV: 90.3
MCV: 90.7
MCV: 90.9
MCV: 91.2
MCV: 91.6
MCV: 92
Platelets: 123 — ABNORMAL LOW
Platelets: 138 — ABNORMAL LOW
Platelets: 148 — ABNORMAL LOW
Platelets: 157
Platelets: 159
Platelets: 207
Platelets: DECREASED
RBC: 3.4 — ABNORMAL LOW
RBC: 3.42 — ABNORMAL LOW
RBC: 3.69 — ABNORMAL LOW
RBC: 3.89 — ABNORMAL LOW
RBC: 4.07 — ABNORMAL LOW
RBC: 4.26
RBC: 4.47
RDW: 13.7
RDW: 14
RDW: 14.1
RDW: 14.4
RDW: 14.5
RDW: 14.6
RDW: 15
WBC: 10.1
WBC: 10.2
WBC: 13.2 — ABNORMAL HIGH
WBC: 13.6 — ABNORMAL HIGH
WBC: 15.8 — ABNORMAL HIGH
WBC: 7.8
WBC: 9.3

## 2011-01-17 LAB — I-STAT EC8
Acid-base deficit: 3 — ABNORMAL HIGH
BUN: 16
Bicarbonate: 22.6
Chloride: 106
Glucose, Bld: 184 — ABNORMAL HIGH
HCT: 37 — ABNORMAL LOW
Hemoglobin: 12.6 — ABNORMAL LOW
Operator id: 273681
Potassium: 4.1
Sodium: 138
TCO2: 24
pCO2 arterial: 40.5
pH, Arterial: 7.354

## 2011-01-17 LAB — BASIC METABOLIC PANEL
BUN: 14
BUN: 18
BUN: 19
BUN: 21
BUN: 22
CO2: 25
CO2: 27
CO2: 27
CO2: 30
CO2: 30
Calcium: 6.9 — ABNORMAL LOW
Calcium: 7.5 — ABNORMAL LOW
Calcium: 7.7 — ABNORMAL LOW
Calcium: 8.2 — ABNORMAL LOW
Calcium: 8.5
Chloride: 104
Chloride: 105
Chloride: 106
Chloride: 108
Chloride: 109
Creatinine, Ser: 0.9
Creatinine, Ser: 0.93
Creatinine, Ser: 0.94
Creatinine, Ser: 0.94
Creatinine, Ser: 0.97
GFR calc Af Amer: >60
GFR calc Af Amer: >60
GFR calc Af Amer: >60
GFR calc Af Amer: >60
GFR calc Af Amer: >60
GFR calc non Af Amer: >60
GFR calc non Af Amer: >60
GFR calc non Af Amer: >60
GFR calc non Af Amer: >60
GFR calc non Af Amer: >60
Glucose, Bld: 113 — ABNORMAL HIGH
Glucose, Bld: 142 — ABNORMAL HIGH
Glucose, Bld: 150 — ABNORMAL HIGH
Glucose, Bld: 90
Glucose, Bld: 93
Potassium: 3.4 — ABNORMAL LOW
Potassium: 3.4 — ABNORMAL LOW
Potassium: 3.7
Potassium: 3.7
Potassium: 4.3
Sodium: 138
Sodium: 139
Sodium: 139
Sodium: 144
Sodium: 144

## 2011-01-17 LAB — URINALYSIS, ROUTINE W REFLEX MICROSCOPIC
Bilirubin Urine: NEGATIVE
Glucose, UA: NEGATIVE
Hgb urine dipstick: NEGATIVE
Ketones, ur: NEGATIVE
Nitrite: NEGATIVE
Protein, ur: NEGATIVE
Specific Gravity, Urine: 1.024
Urobilinogen, UA: 0.2
pH: 5

## 2011-01-17 LAB — POCT I-STAT 3, ART BLOOD GAS (G3+)
Acid-Base Excess: 1
Acid-Base Excess: 1
Acid-Base Excess: 2
Acid-base deficit: 2
Acid-base deficit: 3 — ABNORMAL HIGH
Bicarbonate: 21.8
Bicarbonate: 23.1
Bicarbonate: 26.5 — ABNORMAL HIGH
Bicarbonate: 26.6 — ABNORMAL HIGH
Bicarbonate: 27.5 — ABNORMAL HIGH
O2 Saturation: 100
O2 Saturation: 100
O2 Saturation: 87
O2 Saturation: 96
O2 Saturation: 99
Operator id: 273681
Operator id: 273681
Operator id: 299391
Operator id: 3406
Operator id: 3406
Patient temperature: 36.5
Patient temperature: 37.1
Patient temperature: 37.2
TCO2: 23
TCO2: 24
TCO2: 28
TCO2: 28
TCO2: 29
pCO2 arterial: 39.1
pCO2 arterial: 39.9
pCO2 arterial: 44.4
pCO2 arterial: 44.4
pCO2 arterial: 45.1 — ABNORMAL HIGH
pH, Arterial: 7.355
pH, Arterial: 7.372
pH, Arterial: 7.377
pH, Arterial: 7.384
pH, Arterial: 7.399
pO2, Arterial: 142 — ABNORMAL HIGH
pO2, Arterial: 291 — ABNORMAL HIGH
pO2, Arterial: 353 — ABNORMAL HIGH
pO2, Arterial: 56 — ABNORMAL LOW
pO2, Arterial: 84

## 2011-01-17 LAB — HEMOGLOBIN AND HEMATOCRIT, BLOOD
HCT: 22.3 — ABNORMAL LOW
Hemoglobin: 7.6 — CL

## 2011-01-17 LAB — POCT I-STAT GLUCOSE
Glucose, Bld: 148 — ABNORMAL HIGH
Operator id: 3406

## 2011-01-17 LAB — PREPARE PLATELET PHERESIS

## 2011-01-17 LAB — TYPE AND SCREEN
ABO/RH(D): A POS
Antibody Screen: NEGATIVE

## 2011-01-17 LAB — POCT I-STAT 4, (NA,K, GLUC, HGB,HCT)
Glucose, Bld: 106 — ABNORMAL HIGH
Glucose, Bld: 116 — ABNORMAL HIGH
Glucose, Bld: 120 — ABNORMAL HIGH
Glucose, Bld: 121 — ABNORMAL HIGH
Glucose, Bld: 126 — ABNORMAL HIGH
Glucose, Bld: 156 — ABNORMAL HIGH
HCT: 20 — ABNORMAL LOW
HCT: 21 — ABNORMAL LOW
HCT: 24 — ABNORMAL LOW
HCT: 31 — ABNORMAL LOW
HCT: 33 — ABNORMAL LOW
HCT: 35 — ABNORMAL LOW
Hemoglobin: 10.5 — ABNORMAL LOW
Hemoglobin: 11.2 — ABNORMAL LOW
Hemoglobin: 11.9 — ABNORMAL LOW
Hemoglobin: 6.8 — CL
Hemoglobin: 7.1 — CL
Hemoglobin: 8.2 — ABNORMAL LOW
Operator id: 299391
Operator id: 3406
Operator id: 3406
Operator id: 3406
Operator id: 3406
Operator id: 3406
Potassium: 3.4 — ABNORMAL LOW
Potassium: 3.4 — ABNORMAL LOW
Potassium: 3.7
Potassium: 3.7
Potassium: 4
Potassium: 4.1
Sodium: 133 — ABNORMAL LOW
Sodium: 135
Sodium: 136
Sodium: 136
Sodium: 138
Sodium: 138

## 2011-01-17 LAB — BLOOD GAS, ARTERIAL
Acid-Base Excess: 4 — ABNORMAL HIGH
Bicarbonate: 28 — ABNORMAL HIGH
Drawn by: 274481
FIO2: 0.21
O2 Saturation: 98.1
Patient temperature: 98.6
TCO2: 29.3
pCO2 arterial: 41.9
pH, Arterial: 7.44
pO2, Arterial: 98.8

## 2011-01-17 LAB — I-STAT 8, (EC8 V) (CONVERTED LAB)
Acid-Base Excess: 2
BUN: 19
Bicarbonate: 28.2 — ABNORMAL HIGH
Chloride: 102
Glucose, Bld: 134 — ABNORMAL HIGH
HCT: 37 — ABNORMAL LOW
Hemoglobin: 12.6 — ABNORMAL LOW
Operator id: 285121
Potassium: 4
Sodium: 138
TCO2: 30
pCO2, Ven: 49.5
pH, Ven: 7.364 — ABNORMAL HIGH

## 2011-01-17 LAB — COMPREHENSIVE METABOLIC PANEL
ALT: 38
AST: 24
Albumin: 3.3 — ABNORMAL LOW
Alkaline Phosphatase: 52
BUN: 19
CO2: 28
Calcium: 9.2
Chloride: 104
Creatinine, Ser: 0.78
GFR calc Af Amer: >60
GFR calc non Af Amer: >60
Glucose, Bld: 90
Potassium: 3.8
Sodium: 139
Total Bilirubin: 0.8
Total Protein: 6.3

## 2011-01-17 LAB — CREATININE, SERUM
Creatinine, Ser: 0.8
Creatinine, Ser: 0.96
GFR calc Af Amer: >60
GFR calc Af Amer: >60
GFR calc non Af Amer: >60
GFR calc non Af Amer: >60

## 2011-01-17 LAB — PROTIME-INR
INR: 0.9
INR: 1.3
Prothrombin Time: 12
Prothrombin Time: 16 — ABNORMAL HIGH

## 2011-01-17 LAB — PLATELET COUNT
Platelets: 124 — ABNORMAL LOW
Platelets: 191

## 2011-01-17 LAB — APTT
aPTT: 27
aPTT: 28

## 2011-01-17 LAB — MAGNESIUM
Magnesium: 2.5
Magnesium: 2.7 — ABNORMAL HIGH
Magnesium: 3 — ABNORMAL HIGH

## 2011-01-17 LAB — ABO/RH: ABO/RH(D): A POS

## 2011-01-17 LAB — HEMOGLOBIN A1C
Hgb A1c MFr Bld: 6
Mean Plasma Glucose: 136

## 2011-01-19 NOTE — H&P (Signed)
NAMEJAMARIAN, [name] NO.:  [id number]  MEDICAL RECORD NO.:  [id number]  LOCATION:  3735                         FACILITY:  [facility]  PHYSICIAN:  Stephen Rotunda, MD, FACCDATE OF BIRTH:  1930-04-30  DATE OF ADMISSION:  12/16/2010 DATE OF DISCHARGE:                             HISTORY & PHYSICAL   PRIMARY CARDIOLOGIST:  Stephen Rotunda, MD, [facility]  PRIMARY CARE PROVIDER:  Raenette Hickman. Stephen Coyer, MD  PATIENT PROFILE:  A 77 year old male with prior history of CAD status post coronary artery bypass grafting in 2008 with drug-eluting stent placement to the vein graft to the posterolateral in March of this year who presents with recurrent chest and abdominal pain.  PROBLEM LIST: 1. Chest and abdominal pain. 2. Coronary artery disease.     a.     Status post coronary artery bypass graft x4 in 2008 with      placement of vein graft to the posterolateral, vein graft to      obtuse marginal, vein graft to the diagonal, left internal mammary      artery to the left anterior descending.     b.     March 2012 diagnostic catheterization revealing severe three-      vessel coronary artery disease including 50% left main stenosis,      70-80% LAD stenosis, minor irregularities in left circumflex, and      occluded mid right coronary artery.  The vein graft to      posterolateral has an 80-90% stenosis while the vein graft to the      obtuse marginal was patent as was the left internal mammary artery      to the left anterior descending.  The vein graft to the diagonal      was occluded.  The vein graft to the posterolateral was      successfully stented with a Promus drug-eluting stent. 3. Hypertension. 4. Hyperlipidemia. 5. History of atrial fibrillation. 6. Remote tobacco abuse, quitting about 25 years ago. 7. Asthma. 8. Chronic obstructive pulmonary disease. 9. Obstructive sleep apnea. 10.Peripheral vascular disease.  ALLERGIES:  CODEINE causes vomiting.  HISTORY OF  PRESENT ILLNESS:  A 77 year old male with the above-problem list.  The patient is status post percutaneous intervention and drug- eluting stent placement of the vein graft to the posterolateral branch in March this year.  Since then, he has been doing quite well while working out 3-4 times per week at [employer].  He is very active around the house also without symptoms or limitations.  Two to three days ago, he started to experience abdominal discomfort and intermittent diarrhea. This morning, he woke up around 5:30 a.m. with diarrhea and noted severe 9/10 substernal chest heaviness and pressure similar to prior angina. This was associated with epigastric as well as abdominal discomfort in the setting of the diarrhea.  The patient became diaphoretic and dyspneic.  He took 4 baby aspirin and 2 nitroglycerin without relief and had 5 bowel movements.  After about 30 minutes, his wife called EMS and they arrived quite quickly and he was taken to the [facility] ED.  Upon arrival to the emergency department, the patient was pain-free.  He was not treated with any additional medications.  His cardiac markers have been negative despite a total of 40 minutes of symptoms.  ECG is nonacute.  HOME MEDICATIONS: 1. Amlodipine 5 mg daily. 2. Clonidine 0.3 mg b.i.d. 3. Plavix 75 mg daily. 4. Potassium chloride 40 mEq daily. 5. Pravachol 80 mg daily. 6. Aspirin 325 mg daily. 7. Bisoprolol 10 mg daily. 8. Xopenex inhaler 2 puffs q.4 h p.r.n. 9. Entocort nebulizer b.i.d. 10.Nitroglycerin p.r.n. 11.Prilosec 20 mg daily. 12.Spiriva 18 mcg inhaled daily. 13.Zyrtec 10 mg at bedtime. 14.Xalatan eyedrops daily.  FAMILY HISTORY:  Mother died at 62 of heart failure.  Father died at 53 with an MI.  He has a sister with COPD, a brother who had heart transplant at age 82.  SOCIAL HISTORY:  The patient lives with his wife at home.  He has a grown daughter.  He has about 105-pack-year history tobacco  abuse, smoking multiple packs a day for 40 years and quit about 25 years ago. He denies alcohol or drugs and is active and exercise regularly.  REVIEW OF SYSTEMS:  Positive for chest pain, dyspnea, nausea, weakness, anxiety, and diarrhea.  He is a full code.  Otherwise, all systems reviewed and negative.  PHYSICAL EXAMINATION:  VITAL SIGNS:  Temperature 98.2, heart rate 66, respirations 18, blood pressure 117/57, and pulse oximetry 99% on room air. GENERAL:  A pleasant white male in no acute distress.  Awake, alert, and oriented x3.  He has a normal affect. HEENT:  Normal. NEUROLOGIC:  Grossly intact.  Nonfocal. SKIN:  Warm and dry without lesions or masses. NECK:  Supple with bilateral bruits recorded in left.  No JVD. LUNGS:  Respirations are regular and unlabored with expiratory wheezes in bilateral bases and scattered rhonchi. CARDIAC: Regular S1 and S2 with a soft apical systolic murmur. ABDOMEN:  Round, soft, nontender, and nondistended.  Bowel sounds are present x4. EXTREMITIES:  Warm and dry.  No clubbing or cyanosis with 1 to 2+ left greater than right ankle/malleolar edema.  Distal pulses are 1+ and equal bilaterally.  Chest x-ray shows cardiomegaly, status post CABG, atelectasis versus early infiltrate in the lingula.  No pulmonary edema.  EKG shows sinus rhythm, rate of 71, normal axis, no acute ST-T changes.  Hemoglobin 13.1, hematocrit 37.8, WBC 7.6, and platelets 170.  Sodium 140, potassium 4.2, chloride 104, CO2 of 30, BUN 17, creatinine 0.79, and glucose 126.  Troponin 0.00.  Calcium 9.4.  ASSESSMENT AND PLAN: 1. Unstable angina and coronary artery disease.  The patient presented     to the ED following 40 minutes of abdominal epigastric and     substernal chest pressure associated with dyspnea, nausea, and also     diarrhea.  Symptoms are both atypical and typical.  Currently, no     objective evidence of ischemia.  We will admit and cycle enzymes.     If  enzymes remain negative, we will plan on inpatient Myoview in     the a.m.  If however are enzymes are positive, we will plan     catheterization on Monday.  We will add heparin and nitro paste for     now and continue his home meds. 2. Hypertension.  Stable.  Continue home meds. 3. Hyperlipidemia.  Check lipids and LFTs.  Continue statin. 4. Chronic obstructive pulmonary disease.  Stable.  Continue home     meds.     Nicolasa Ducking, ANP   ______________________________ Stephen Rotunda, MD, [facility]  CB/MEDQ  D:  12/16/2010  T:  12/17/2010  Job:  [id number]  Electronically Signed by Nicolasa Ducking ANP on 12/22/2010 03:24:49 PM Electronically Signed by Stephen Rotunda MD [facility] on 01/19/2011 01:36:07 PM

## 2011-02-09 ENCOUNTER — Other Ambulatory Visit: Payer: Self-pay | Admitting: Internal Medicine

## 2011-02-10 ENCOUNTER — Encounter: Payer: Self-pay | Admitting: *Deleted

## 2011-02-13 ENCOUNTER — Encounter: Payer: Self-pay | Admitting: Internal Medicine

## 2011-02-13 ENCOUNTER — Ambulatory Visit (INDEPENDENT_AMBULATORY_CARE_PROVIDER_SITE_OTHER): Payer: Medicare Other | Admitting: Internal Medicine

## 2011-02-13 VITALS — BP 138/68 | HR 72 | Ht 62.0 in | Wt 154.0 lb

## 2011-02-13 DIAGNOSIS — I2510 Atherosclerotic heart disease of native coronary artery without angina pectoris: Secondary | ICD-10-CM

## 2011-02-13 DIAGNOSIS — E785 Hyperlipidemia, unspecified: Secondary | ICD-10-CM

## 2011-02-13 DIAGNOSIS — R197 Diarrhea, unspecified: Secondary | ICD-10-CM

## 2011-02-13 DIAGNOSIS — J45909 Unspecified asthma, uncomplicated: Secondary | ICD-10-CM

## 2011-02-13 DIAGNOSIS — I10 Essential (primary) hypertension: Secondary | ICD-10-CM

## 2011-02-13 NOTE — Assessment & Plan Note (Signed)
On high dose prava - tolerating well, LDL<70 at goal The current medical regimen is effective;  continue present plan and medications.

## 2011-02-13 NOTE — Patient Instructions (Signed)
It was good to see you today. Medications reviewed, no changes at this time. Let us know if you need new hearing check - we can make a referral as needed Please reschedule followup in 4 months, call sooner if problems.

## 2011-02-13 NOTE — Progress Notes (Signed)
Subjective:    Patient ID: [name], male    DOB: 07-29-1930, 77 y.o.   MRN: [id number]  HPI  here for follow up-  reviewed chronic medical issues today:  CAD s/p CABG 2008, DES to saph v graft 06/2010 due to abn stress test - follows with cards for same - reports compliance with ongoing medical treatment and no changes in medication dose or frequency. denies adverse side effects related to current therapy. no CP or anginal symptoms   HTN - reports compliance with ongoing medical treatment and no changes in medication dose or frequency. denies adverse side effects related to current therapy. no edema or HA or weakness  dyslipidemia - changed from simva to prava 6/2011due to FDA warnings - reports compliance with ongoing medical treatment and no other changes in medication dose or frequency. denies adverse side effects related to current therapy. no muscle pain or weakness  asthma, remote tobacco - follows with pulm for same - frequent flares related to weather change - reports compliance with ongoing nubulizer treatment and no changes in medication dose or frequency. denies adverse side effects related to current therapy.   Past Medical History  Diagnosis Date  . CORONARY HEART DISEASE     a. s/p CABG;  b.  cath 06/27/10: S-Dx occluded, S-PDA 80-90% (tx with PCI); S-OM ok, L-LAD ok;  EF 65-70%  c.  s/p Promus DES to S-PDA 06/2010;   d. Myoview 8/12: low risk  . FIBRILLATION, ATRIAL     post op; ?documented during hosp. 06/2010  . SLEEP APNEA 09/2001    NPSG AHI 22/HR  . ALLERGIC RHINITIS   . ASTHMA   . Depression   . GERD   . DYSLIPIDEMIA   . HYPERTENSION     Echo 3/12: EF 55-60%; mod LVH; mild AS/AI; LAE; PASP 38; mild pulmo HTN  . Personal history of alcoholism   . Esophageal stricture   . Hiatal hernia   . Diverticulosis   . Benign liver cyst     Review of Systems  Constitutional: Negative for fever and unexpected weight change.  Respiratory: Negative for cough and  shortness of breath.   Cardiovascular: Negative for chest pain and leg swelling.  Neurological: Negative for dizziness, weakness, light-headedness, numbness and headaches.      Objective:   Physical Exam  BP 144/72  Pulse 66  Temp(Src) 97.3 F (36.3 C) (Oral)  Wt 153 lb (69.4 kg)  SpO2 99% Wt Readings from Last 3 Encounters:  02/13/11 153 lb (69.4 kg)  01/03/11 150 lb (68.04 kg)  01/02/11 151 lb 12.8 oz (68.856 kg)   Constitutional:  He appears well-developed and well-nourished. No distress.  Cardiovascular: Normal rate, irregular rhythm and normal heart sounds.  No murmur heard. No BLE edema Pulmonary/Chest: Effort normal and breath sounds normal. No respiratory distress. no wheezes.  Neurological: he is alert and oriented to person, place, and time. No cranial nerve deficit. Coordination normal.   Psychiatric: he has a normal mood and affect. behavior is normal. Judgment and thought content normal.     Lab Results  Component Value Date   WBC 6.8 12/17/2010   HGB 13.2 12/17/2010   HCT 39.4 12/17/2010   PLT 178 12/17/2010   CHOL 114 12/17/2010   TRIG 99 12/17/2010   HDL 35* 12/17/2010   ALT 13 04/10/9145   AST 15 12/17/2010   NA 144 12/17/2010   K 3.7 12/17/2010   CL 108 12/17/2010   CREATININE 0.79 12/17/2010  BUN 17 12/17/2010   CO2 29 12/17/2010   TSH 1.718 06/25/2010   PSA 0.62 10/10/2010   INR 1.00 12/16/2010   HGBA1C  Value: 6.0 (NOTE)   The ADA recommends the following therapeutic goals for glycemic   control related to Hgb A1C measurement:   Goal of Therapy:   < 7.0% Hgb A1C   Action Suggested:  > 8.0% Hgb A1C   Ref:  Diabetes Care, 22, Suppl. 1, 1999 02/26/2007   Assessment & Plan:  See problem list. Medications and labs reviewed today.  Time spent with pt today 25 minutes, greater than 50% time spent counseling patient on chronic decreased hearing despite hearing aides and check x2, asthma flare last month and medication review.

## 2011-02-13 NOTE — Patient Instructions (Signed)
Please follow up as needed 

## 2011-02-13 NOTE — Assessment & Plan Note (Signed)
The current medical regimen is effective;  continue present plan and medications. BP Readings from Last 3 Encounters:  02/13/11 144/72  01/03/11 138/78  01/02/11 128/79

## 2011-02-13 NOTE — Progress Notes (Signed)
HISTORY OF PRESENT ILLNESS:  [name] is a 77 y.o. male with multiple significant medical problems as listed below. He presents today for followup. He is accompanied by his wife. He was last seen 01/03/2011 regarding a six-month history of new-onset diarrhea. See that dictation. He was empirically treated with metronidazole 250 mg 4 times a day x10 days. Within 3 days, his diarrhea resolved without recurrence. He required Imodium one occasion only. He continues to do well in terms of his bowel habits. No new GI problems.  REVIEW OF SYSTEMS:  All non-GI ROS negative except for hearing impairment  Past Medical History  Diagnosis Date  . CORONARY HEART DISEASE     a. s/p CABG;  b.  cath 06/27/10: S-Dx occluded, S-PDA 80-90% (tx with PCI); S-OM ok, L-LAD ok;  EF 65-70%  c.  s/p Promus DES to S-PDA 06/2010;   d. Myoview 8/12: low risk  . FIBRILLATION, ATRIAL     post op; ?documented during hosp. 06/2010  . SLEEP APNEA 09/2001    NPSG AHI 22/HR  . ALLERGIC RHINITIS   . ASTHMA   . Depression   . GERD   . DYSLIPIDEMIA   . HYPERTENSION     Echo 3/12: EF 55-60%; mod LVH; mild AS/AI; LAE; PASP 38; mild pulmo HTN  . Personal history of alcoholism   . Esophageal stricture   . Hiatal hernia   . Diverticulosis   . Benign liver cyst     Past Surgical History  Procedure Date  . Coronary artery bypass graft 02/2007  . Hernia repair 12/03/07  . Tonsillectomy   . Coronary angioplasty with stent placement 07/2010    Social History EDREES VALENT  reports that he quit smoking about 31 years ago. His smoking use included Cigarettes. He does not have any smokeless tobacco history on file. He reports that he does not drink alcohol or use illicit drugs.  family history includes Asthma in his sister; COPD in his sister; and Emphysema in his sister.  Allergies  Allergen Reactions  . Codeine     REACTION: intolerant       PHYSICAL EXAMINATION: Vital signs: BP 138/68  Pulse 72  Ht 5\' 2"  (1.575  m)  Wt 154 lb (69.854 kg)  BMI 28.17 kg/m2 General: Well-developed, well-nourished, no acute distress Abdomen: Not reexamined Psychiatric: alert and oriented x3. Cooperative    ASSESSMENT:  #1. Problems with chronic diarrhea resolved after a course of metronidazole. Possible causes include bacterial overgrowth or occult infection such as Giardia.   PLAN:  #1. GI followup as needed. Return to the care of your primary provider

## 2011-02-13 NOTE — Assessment & Plan Note (Signed)
DES 06/28/10- CABG 2008 - no anginal symptoms  The current medical regimen is effective; ASA, plavix, statin: continue present plan and medications.  Follow up with cards as ongoing  

## 2011-02-13 NOTE — Assessment & Plan Note (Signed)
Mild flare with change in weather - [facility], resolved with pred taper The current medical regimen is effective;  continue present plan and medications.  Also follow up pulmonary as ongoing

## 2011-02-15 ENCOUNTER — Other Ambulatory Visit: Payer: Self-pay

## 2011-02-15 ENCOUNTER — Encounter (HOSPITAL_BASED_OUTPATIENT_CLINIC_OR_DEPARTMENT_OTHER): Payer: Self-pay | Admitting: *Deleted

## 2011-02-15 ENCOUNTER — Emergency Department (INDEPENDENT_AMBULATORY_CARE_PROVIDER_SITE_OTHER): Payer: Medicare Other

## 2011-02-15 ENCOUNTER — Inpatient Hospital Stay (HOSPITAL_BASED_OUTPATIENT_CLINIC_OR_DEPARTMENT_OTHER)
Admission: EM | Admit: 2011-02-15 | Discharge: 2011-02-23 | DRG: 418 | Disposition: A | Discharge status: Home / Self Care | Payer: Medicare Other | Attending: Internal Medicine | Admitting: Internal Medicine

## 2011-02-15 DIAGNOSIS — F3289 Other specified depressive episodes: Secondary | ICD-10-CM | POA: Diagnosis present

## 2011-02-15 DIAGNOSIS — Z79899 Other long term (current) drug therapy: Secondary | ICD-10-CM

## 2011-02-15 DIAGNOSIS — K5730 Diverticulosis of large intestine without perforation or abscess without bleeding: Secondary | ICD-10-CM | POA: Diagnosis present

## 2011-02-15 DIAGNOSIS — Z951 Presence of aortocoronary bypass graft: Secondary | ICD-10-CM

## 2011-02-15 DIAGNOSIS — F329 Major depressive disorder, single episode, unspecified: Secondary | ICD-10-CM | POA: Diagnosis present

## 2011-02-15 DIAGNOSIS — J309 Allergic rhinitis, unspecified: Secondary | ICD-10-CM | POA: Diagnosis present

## 2011-02-15 DIAGNOSIS — Z9861 Coronary angioplasty status: Secondary | ICD-10-CM

## 2011-02-15 DIAGNOSIS — I10 Essential (primary) hypertension: Secondary | ICD-10-CM | POA: Diagnosis present

## 2011-02-15 DIAGNOSIS — K859 Acute pancreatitis without necrosis or infection, unspecified: Principal | ICD-10-CM | POA: Diagnosis present

## 2011-02-15 DIAGNOSIS — Z7982 Long term (current) use of aspirin: Secondary | ICD-10-CM

## 2011-02-15 DIAGNOSIS — K8020 Calculus of gallbladder without cholecystitis without obstruction: Secondary | ICD-10-CM | POA: Diagnosis present

## 2011-02-15 DIAGNOSIS — R112 Nausea with vomiting, unspecified: Secondary | ICD-10-CM

## 2011-02-15 DIAGNOSIS — K7689 Other specified diseases of liver: Secondary | ICD-10-CM

## 2011-02-15 DIAGNOSIS — R188 Other ascites: Secondary | ICD-10-CM

## 2011-02-15 DIAGNOSIS — Z7902 Long term (current) use of antithrombotics/antiplatelets: Secondary | ICD-10-CM

## 2011-02-15 DIAGNOSIS — J45901 Unspecified asthma with (acute) exacerbation: Secondary | ICD-10-CM | POA: Diagnosis not present

## 2011-02-15 DIAGNOSIS — R109 Unspecified abdominal pain: Secondary | ICD-10-CM

## 2011-02-15 DIAGNOSIS — E876 Hypokalemia: Secondary | ICD-10-CM | POA: Diagnosis present

## 2011-02-15 DIAGNOSIS — G4733 Obstructive sleep apnea (adult) (pediatric): Secondary | ICD-10-CM | POA: Diagnosis present

## 2011-02-15 DIAGNOSIS — I4891 Unspecified atrial fibrillation: Secondary | ICD-10-CM | POA: Diagnosis present

## 2011-02-15 DIAGNOSIS — Z87891 Personal history of nicotine dependence: Secondary | ICD-10-CM

## 2011-02-15 DIAGNOSIS — E860 Dehydration: Secondary | ICD-10-CM | POA: Diagnosis present

## 2011-02-15 DIAGNOSIS — I2510 Atherosclerotic heart disease of native coronary artery without angina pectoris: Secondary | ICD-10-CM | POA: Diagnosis present

## 2011-02-15 DIAGNOSIS — I498 Other specified cardiac arrhythmias: Secondary | ICD-10-CM | POA: Diagnosis not present

## 2011-02-15 DIAGNOSIS — J441 Chronic obstructive pulmonary disease with (acute) exacerbation: Secondary | ICD-10-CM | POA: Diagnosis not present

## 2011-02-15 DIAGNOSIS — Z8249 Family history of ischemic heart disease and other diseases of the circulatory system: Secondary | ICD-10-CM

## 2011-02-15 DIAGNOSIS — E785 Hyperlipidemia, unspecified: Secondary | ICD-10-CM | POA: Diagnosis present

## 2011-02-15 DIAGNOSIS — J449 Chronic obstructive pulmonary disease, unspecified: Secondary | ICD-10-CM | POA: Insufficient documentation

## 2011-02-15 DIAGNOSIS — K219 Gastro-esophageal reflux disease without esophagitis: Secondary | ICD-10-CM | POA: Diagnosis present

## 2011-02-15 DIAGNOSIS — F411 Generalized anxiety disorder: Secondary | ICD-10-CM | POA: Diagnosis not present

## 2011-02-15 HISTORY — DX: Unspecified malignant neoplasm of skin, unspecified: C44.90

## 2011-02-15 LAB — COMPREHENSIVE METABOLIC PANEL
ALT: 106 U/L — ABNORMAL HIGH (ref 0–53)
Alkaline Phosphatase: 152 U/L — ABNORMAL HIGH (ref 39–117)
BUN: 12 mg/dL (ref 6–23)
CO2: 28 mEq/L (ref 19–32)
Chloride: 98 mEq/L (ref 96–112)
GFR calc Af Amer: >90 mL/min (ref >90)
Glucose, Bld: 158 mg/dL — ABNORMAL HIGH (ref 70–99)
Potassium: 3.2 mEq/L — ABNORMAL LOW (ref 3.5–5.1)
Sodium: 137 mEq/L (ref 135–145)
Total Bilirubin: 1 mg/dL (ref 0.3–1.2)

## 2011-02-15 LAB — CBC
Hemoglobin: 14.8 g/dL (ref 13.0–17.0)
MCH: 28.9 pg (ref 26.0–34.0)
MCH: 29.8 pg (ref 26.0–34.0)
Platelets: 207 10*3/uL (ref 150–400)
Platelets: 188 10*3/uL (ref 150–400)
RBC: 5.12 MIL/uL (ref 4.22–5.81)
RBC: 4.63 MIL/uL (ref 4.22–5.81)
RDW: 13.8 % (ref 11.5–15.5)
WBC: 13.4 10*3/uL — ABNORMAL HIGH (ref 4.0–10.5)

## 2011-02-15 LAB — DIFFERENTIAL
Lymphocytes Relative: 10 % — ABNORMAL LOW (ref 12–46)
Lymphs Abs: 1.4 10*3/uL (ref 0.7–4.0)
Monocytes Relative: 6 % (ref 3–12)
Neutro Abs: 11.2 10*3/uL — ABNORMAL HIGH (ref 1.7–7.7)
Neutrophils Relative %: 83 % — ABNORMAL HIGH (ref 43–77)

## 2011-02-15 LAB — URINALYSIS, ROUTINE W REFLEX MICROSCOPIC
Bilirubin Urine: NEGATIVE
Hgb urine dipstick: NEGATIVE
Ketones, ur: NEGATIVE mg/dL
Nitrite: NEGATIVE
Specific Gravity, Urine: 1.011 (ref 1.005–1.030)
Urobilinogen, UA: 0.2 mg/dL (ref 0.0–1.0)
pH: 7.5 (ref 5.0–8.0)

## 2011-02-15 LAB — CARDIAC PANEL(CRET KIN+CKTOT+MB+TROPI): Total CK: 54 U/L (ref 7–232)

## 2011-02-15 LAB — LIPASE, BLOOD: Lipase: 5268 U/L — ABNORMAL HIGH (ref 11–59)

## 2011-02-15 LAB — MAGNESIUM: Magnesium: 1.9 mg/dL (ref 1.5–2.5)

## 2011-02-15 LAB — CREATININE, SERUM: Creatinine, Ser: 0.73 mg/dL (ref 0.50–1.35)

## 2011-02-15 MED ORDER — ALBUTEROL SULFATE (5 MG/ML) 0.5% IN NEBU
2.5000 mg | INHALATION_SOLUTION | RESPIRATORY_TRACT | Status: DC | PRN
  Administered 2011-02-15 – 2011-02-16 (×3): 2.5 mg via RESPIRATORY_TRACT
  Filled 2011-02-15 (×4): qty 0.5

## 2011-02-15 MED ORDER — ENOXAPARIN SODIUM 40 MG/0.4ML ~~LOC~~ SOLN
40.0000 mg | Freq: Every day | SUBCUTANEOUS | Status: DC
  Administered 2011-02-15 – 2011-02-20 (×6): 40 mg via SUBCUTANEOUS
  Filled 2011-02-15 (×8): qty 0.4

## 2011-02-15 MED ORDER — SODIUM CHLORIDE 0.9 % IV SOLN
500.0000 mg | Freq: Three times a day (TID) | INTRAVENOUS | Status: DC
  Administered 2011-02-15 – 2011-02-21 (×18): 500 mg via INTRAVENOUS
  Filled 2011-02-15 (×21): qty 500

## 2011-02-15 MED ORDER — HYDROMORPHONE HCL PF 2 MG/ML IJ SOLN
2.0000 mg | INTRAMUSCULAR | Status: AC | PRN
  Administered 2011-02-15: 2 mg via INTRAVENOUS
  Filled 2011-02-15: qty 1

## 2011-02-15 MED ORDER — DEXTROSE-NACL 5-0.45 % IV SOLN
INTRAVENOUS | Status: AC
  Administered 2011-02-15: 17:00:00 via INTRAVENOUS

## 2011-02-15 MED ORDER — DEXTROSE-NACL 5-0.45 % IV SOLN
INTRAVENOUS | Status: DC
  Administered 2011-02-15 – 2011-02-16 (×3): via INTRAVENOUS
  Administered 2011-02-17: 50 mL/h via INTRAVENOUS
  Administered 2011-02-18: 06:00:00 via INTRAVENOUS
  Administered 2011-02-19: 50 mL/h via INTRAVENOUS

## 2011-02-15 MED ORDER — HYDROMORPHONE HCL PF 1 MG/ML IJ SOLN
1.0000 mg | Freq: Once | INTRAMUSCULAR | Status: AC
  Administered 2011-02-15: 1 mg via INTRAVENOUS
  Filled 2011-02-15: qty 1

## 2011-02-15 MED ORDER — HYDROMORPHONE HCL PF 1 MG/ML IJ SOLN
1.0000 mg | INTRAMUSCULAR | Status: DC | PRN
  Administered 2011-02-15: 1 mg via INTRAVENOUS
  Filled 2011-02-15: qty 1

## 2011-02-15 MED ORDER — PANTOPRAZOLE SODIUM 40 MG IV SOLR
40.0000 mg | Freq: Every day | INTRAVENOUS | Status: DC
  Administered 2011-02-15 – 2011-02-21 (×7): 40 mg via INTRAVENOUS
  Filled 2011-02-15 (×8): qty 40

## 2011-02-15 MED ORDER — ONDANSETRON HCL 4 MG/2ML IJ SOLN
4.0000 mg | Freq: Once | INTRAMUSCULAR | Status: AC
  Administered 2011-02-15: 4 mg via INTRAVENOUS
  Filled 2011-02-15: qty 2

## 2011-02-15 MED ORDER — ONDANSETRON HCL 4 MG/2ML IJ SOLN
4.0000 mg | Freq: Three times a day (TID) | INTRAMUSCULAR | Status: AC | PRN
  Administered 2011-02-15: 4 mg via INTRAVENOUS
  Filled 2011-02-15: qty 2

## 2011-02-15 MED ORDER — SODIUM CHLORIDE 0.9 % IV SOLN
999.0000 mL | INTRAVENOUS | Status: DC
  Administered 2011-02-15: 1000 mL via INTRAVENOUS

## 2011-02-15 MED ORDER — ENALAPRILAT 1.25 MG/ML IV SOLN
0.6250 mg | Freq: Four times a day (QID) | INTRAVENOUS | Status: DC
  Administered 2011-02-16: 19:00:00 via INTRAVENOUS
  Administered 2011-02-16 – 2011-02-22 (×23): 0.625 mg via INTRAVENOUS
  Filled 2011-02-15 (×30): qty 0.5

## 2011-02-15 NOTE — H&P (Signed)
[name], [name]                 ACCOUNT NO.:  [id number]  MEDICAL RECORD NO.:  [id number]  LOCATION:  1315                         FACILITY:  [facility]  PHYSICIAN:  Talmage Nap, MD  DATE OF BIRTH:  13-Jul-1930  DATE OF ADMISSION:  02/15/2011 DATE OF DISCHARGE:                             HISTORY & PHYSICAL   PRIMARY CARE PHYSICIAN:  Unknown.  History is essentially obtainable from the patient.  CHIEF COMPLAINT:  Epigastric as well as right upper quadrant pain which started early hours of this morning 5 a.m. on February 15, 2011.  HISTORY OF PRESENT ILLNESS:  The patient is a 77 year old Caucasian male with history of coronary artery disease, status post CABG and atrial fibrillation, and asthma and hypertension.  The patient was seen by me on the floor in [facility] with complaints of epigastric as well as right upper quadrant pain which started early hours of today. The patient claimed that he had been in stable health until about 5 a.m. this morning.  He developed pain which he described as constant, deep- seated at 10/10 in intensity, and was radiating to the flanks bilaterally.  The pain was said to be associated with nausea and multiple episodes of vomiting.  The patient claimed he was diaphoretic, but he denied any history of shortness of breath.  He denied any diarrhea or hematochezia.  The patient claimed he took 2 tablets of aspirin, nitroglycerin, and antacid, and pain was said to have resolved. At about mid-afternoon, however, he had a 2nd episode of the pain, much higher in intensity, but the patient denied any fever.  He denied any chills.  He denied any rigor.  Pain was said to be constant.  He took 2 tablets of aspirin, but pain never abated.  He claimed he had multiple episodes of nausea and vomiting.  Denied any diarrhea or hematochezia. No dysuria or hematuria.  The pain in the epigastric as well as in the right upper quadrant was said to have  persisted, and subsequently emergency medical service was called and the patient was taken to [facility].  After the patient was evaluated at [facility], he was subsequently transferred to [facility] for further evaluation and stabilization.  PAST MEDICAL HISTORY:  Positive for: 1. Coronary artery disease. 2. Atrial fibrillation. 3. Sleep apnea. 4. Allergic rhinitis. 5. Asthma. 6. Depression. 7. GERD. 8. Dyslipidemia. 9. Hypertension. 10.History of hiatal hernia.  PAST SURGICAL HISTORY: 1. Coronary artery disease, status post CABG, status post a stent. 2. Tonsillectomy. 3. Hernia repair.  PREADMISSION MEDICATIONS:  Include albuterol nebulizer, amlodipine, aspirin, bisoprolol, Pulmicort, cetirizine, clonidine, Plavix, Xalatan, nitroglycerin, omeprazole, potassium chloride, pravastatin, and probiotic.  ALLERGIES:  CODEINE.  SOCIAL HISTORY:  Negative for alcohol and tobacco use.  The patient is currently retired.  FAMILY HISTORY:  York Spaniel to be positive for COPD and asthma.  Mother deceased, died of complication of congestive heart failure.  Father is also deceased, died of complication of MI.  Has a sister with rheumatoid arthritis and a brother with heart transplant.  REVIEW OF SYSTEMS:  The patient denies any history of headaches. Complained of nausea with vomiting.  Denies any chest pain or shortness of breath.  Complained about pain in the epigastric as well as in the right upper quadrant with associated nausea and vomiting.  Denies any diarrhea or hematochezia.  No dysuria or hematuria.  No swelling of the lower extremities.  No intolerance to heat or cold.  No neuropsychiatric disorder.  PHYSICAL EXAMINATION:  GENERAL:  Elderly man, not in any respiratory distress, dehydrated. PRESENT VITAL SIGNS:  Blood pressure is 160/75, pulse is 82, respiratory rate 18, and temperature is 97.9. HEENT:  Pupils are reactive to light, and extraocular  muscles are intact. NECK:  No jugular venous distention.  No carotid bruit.  No lymphadenopathy. CHEST:  Showed minimal scattered rhonchi at the lung bases. HEART:  Sounds are 1 and 2. ABDOMEN:  Soft with tenderness in the epigastric as well as in the right upper quadrant.  No guarding.  No rigidity.  Liver, spleen, and kidney not palpable.  Bowel sounds are hypoactive. EXTREMITIES:  No pedal edema. NEUROLOGIC:  Nonfocal. MUSCULOSKELETAL SYSTEM:  Shows arthritic changes in the knees and in the feet. NEUROPSYCHIATRIC EVALUATION:  Unremarkable.  LABORATORY DATA:  Initial chemistry showed sodium of 137, potassium of 3.2, chloride of 98, bicarb is 28, BUN is 12, creatinine 0.70, and glucose is 158.  LFTs show, alkaline phosphatase is 152, albumin is 4.6, AST 150 and ALT 106, and lipase is 5268.  Hematological indices showed WBC of 13.4, hemoglobin of 14.8, hematocrit 43.4, platelet count of 207, neutrophils 83%, and absolute neutrophil count is 11.2.  Urinalysis is unremarkable.  IMAGING:  Studies done include ultrasound of the abdomen which showed mobilized gallstones within the gallbladder, the largest measures about 5 mm.  No thickening of the gallbladder wall, also no growth of Murphy sign.  There is normal CBD.  Hepatic cysts are noted.  No hydronephrosis or diagnosis or renal calculus seen.  Acute abdominal series showed moderate colonic stool burden then without evidence of obstruction. There is no active cardiopulmonary process.  IMPRESSION/PROBLEM LIST: 1. Epigastric pain 2. Right upper quadrant pain. 3. Gallstones. 4. Pancreatitis. 5. Hypokalemia.  ADMITTING IMPRESSION: 1. Gallstone pancreatitis. 2. Hypokalemia. 3. Dehydration. 4. Coronary artery disease, status post coronary artery bypass graft. 5. Atrial fibrillation. 6. Asthma. 7. Depression. 8. Gastroesophageal reflux disease. 9. Hypertension. 10.Dyslipidemia.  PLAN:  Is to admit the patient to telemetry.   The patient will be NPO in order to rest the bowel.  He will be on D5 half-normal saline to go at a rate of 50 cc an hour.  Pain control will be done with Dilaudid 2 mg IV q.4 hours p.r.n., and he will also be on Zofran 4 mg IV q.8 hours p.r.n. The patient will also be started on Primaxin 500 mg IV q.8 hourly. Blood pressure will be maintained with IV Vasotec 0.25 mg IV q.6.  Since the patient has a history of asthma, he will be on albuterol nebulizers 2.5 mg q.4 hours p.r.n. for shortness of breath and wheezing.  GI prophylaxis will be done with Protonix 40 mg IV every night, and DVT prophylaxis with Lovenox 40 mg subcu q.24h.  Further workup to be done on this patient will include cardiac enzymes q.6 x3.  CBC, CMP, and magnesium will be repeated in a.m.  General Surgery will be consulted for further evaluation of the patient's gallstone pancreatitis.  The patient may likely need cholecystectomy in this admission.  He will be followed and evaluated on a day-to-day basis.     Talmage Nap, MD  CN/MEDQ  D:  02/15/2011  T:  02/15/2011  Job:  [id number]

## 2011-02-15 NOTE — ED Notes (Signed)
EMS reports patient woke up this am with generalized abdominal pain associated nausea and vomiting this am, resolved now.  Abdominal pain resolved after N/V, returned after lunch, tender to palpatation.  Denies any diarrhea.

## 2011-02-15 NOTE — H&P (Signed)
  [id number] 

## 2011-02-15 NOTE — ED Notes (Signed)
During initial vitals upon arrival, patient stated he had profuse sweating earlier, felt the need for bowel movement now, and that he may vomit. I gave patient emesis bag, completed vitals, and helped patient into gown. Patient stated pain radiating from mid sternal area to just above naval. I noticed no ECG pads from EMS, so I talked to EMS driver who felt no need for ecg during transport. I ran ECG anyway in absence of nurse who was with another patient. I gave copy to of old and new to Dr. Lynelle Doctor.

## 2011-02-15 NOTE — ED Provider Notes (Addendum)
History     CSN: [id number] Arrival date & time: 02/15/2011  1:54 PM   First MD Initiated Contact with Patient 02/15/11 1355      Chief Complaint  Patient presents with  . Abdominal Pain    (Consider location/radiation/quality/duration/timing/severity/associated sxs/prior treatment) HPI Comments: It started early this AM.  Upper abdomen to the navel.  Nausea and vomiting this am.  Pt ntg, asa, and antacid without relief.    Patient is a 77 y.o. male presenting with abdominal pain. The history is provided by the patient.  Abdominal Pain The primary symptoms of the illness include abdominal pain. The primary symptoms of the illness do not include fever, shortness of breath or dysuria. The current episode started 6 to 12 hours ago. The onset of the illness was gradual. The problem has been gradually worsening.  The abdominal pain is located in the LUQ, RUQ and epigastric region. The abdominal pain does not radiate. Exacerbated by: pt ate an egg bisuit this am, the pain started up again after 2 hours.  Additional symptoms associated with the illness include anorexia and heartburn. Symptoms associated with the illness do not include urgency or back pain.    Past Medical History  Diagnosis Date  . CORONARY HEART DISEASE     a. s/p CABG;  b.  cath 06/27/10: S-Dx occluded, S-PDA 80-90% (tx with PCI); S-OM ok, L-LAD ok;  EF 65-70%  c.  s/p Promus DES to S-PDA 06/2010;   d. Myoview 8/12: low risk  . FIBRILLATION, ATRIAL     post op; ?documented during hosp. 06/2010  . SLEEP APNEA 09/2001    NPSG AHI 22/HR  . ALLERGIC RHINITIS   . ASTHMA   . Depression   . GERD   . DYSLIPIDEMIA   . HYPERTENSION     Echo 3/12: EF 55-60%; mod LVH; mild AS/AI; LAE; PASP 38; mild pulmo HTN  . Personal history of alcoholism   . Esophageal stricture   . Hiatal hernia   . Diverticulosis   . Benign liver cyst     Past Surgical History  Procedure Date  . Coronary artery bypass graft 02/2007  . Hernia repair  12/03/07  . Tonsillectomy   . Coronary angioplasty with stent placement 07/2010    Family History  Problem Relation Age of Onset  . COPD Sister   . Asthma Sister   . Emphysema Sister     History  Substance Use Topics  . Smoking status: Former Smoker    Types: Cigarettes    Quit date: 04/11/1979  . Smokeless tobacco: Not on file  . Alcohol Use: No      Review of Systems  Constitutional: Negative for fever.  Respiratory: Negative for chest tightness and shortness of breath.   Gastrointestinal: Positive for heartburn, abdominal pain and anorexia.  Genitourinary: Negative for dysuria and urgency.  Musculoskeletal: Negative for back pain.  All other systems reviewed and are negative.    Allergies  Codeine  Home Medications   Current Outpatient Rx  Name Route Sig Dispense Refill  . ALBUTEROL SULFATE (2.5 MG/3ML) 0.083% IN NEBU Nebulization Take 3 mLs (2.5 mg total) by nebulization 2 (two) times daily. DX:  493.90 180 mL 5  . AMLODIPINE BESYLATE 5 MG PO TABS  take 1 tablet by mouth twice a day 60 tablet 5  . ASPIRIN 81 MG PO TABS Oral Take 81 mg by mouth daily.      Marland Kitchen BISOPROLOL FUMARATE 10 MG PO TABS Oral Take 1  tablet (10 mg total) by mouth daily. 30 tablet 11  . BUDESONIDE 0.25 MG/2ML IN SUSP Nebulization Take 2 mLs (0.25 mg total) by nebulization 2 (two) times daily. 120 mL 11  . CETIRIZINE HCL 10 MG PO TABS Oral Take 10 mg by mouth daily.      Marland Kitchen CLONIDINE HCL 0.3 MG PO TABS  take 2 tablets by mouth twice a day 120 tablet 6  . CLOPIDOGREL BISULFATE 75 MG PO TABS Oral Take 1 tablet (75 mg total) by mouth daily. 30 tablet 11  . LATANOPROST 0.005 % OP SOLN Both Eyes Place 1 drop into both eyes daily.      Marland Kitchen NITROGLYCERIN 0.4 MG SL SUBL Sublingual Place 0.4 mg under the tongue every 5 (five) minutes as needed.      . NON FORMULARY  Allergy Injection every Friday     . OMEPRAZOLE 20 MG PO CPDR Oral Take 20 mg by mouth daily.      Marland Kitchen POTASSIUM CHLORIDE CRYS CR 20 MEQ PO TBCR  Oral Take 1 tablet (20 mEq total) by mouth 2 (two) times daily. 60 tablet 11  . PRAVASTATIN SODIUM 80 MG PO TABS  take 1 tablet by mouth every evening 30 tablet 7  . PHILLIPS COLON HEALTH PO Oral Take by mouth daily.        BP 181/71  Pulse 65  Temp 97.6 F (36.4 C)  Resp 20  Ht 5\' 2"  (1.575 m)  Wt 152 lb (68.947 kg)  BMI 27.80 kg/m2  SpO2 97%  Physical Exam  Nursing note and vitals reviewed. Constitutional: No distress.  HENT:  Head: Normocephalic and atraumatic.  Right Ear: External ear normal.  Left Ear: External ear normal.  Eyes: Conjunctivae are normal. Right eye exhibits no discharge. Left eye exhibits no discharge. No scleral icterus.  Neck: Neck supple. No tracheal deviation present.  Cardiovascular: Normal rate, regular rhythm and intact distal pulses.   No murmur heard. Pulmonary/Chest: Effort normal and breath sounds normal. No stridor. No respiratory distress. He has no wheezes. He has no rales.  Abdominal: Soft. Bowel sounds are normal. He exhibits no distension and no mass. There is tenderness in the right upper quadrant and epigastric area. There is no rebound, no guarding, no tenderness at McBurney's point and negative Murphy's sign.  Musculoskeletal: He exhibits no edema and no tenderness.  Neurological: He is alert. He has normal strength. No sensory deficit. Cranial nerve deficit:  no gross defecits noted. He exhibits normal muscle tone. He displays no seizure activity. Coordination normal.  Skin: Skin is warm and dry. No rash noted.  Psychiatric: He has a normal mood and affect.    ED Course  Procedures (including critical care time)  Date: 02/15/2011  Rate: 67  Rhythm: normal sinus rhythm and premature atrial contractions (PAC)  QRS Axis: normal  Intervals: normal  ST/T Wave abnormalities: normal  Conduction Disutrbances:Incomplete right bundle branch block  Narrative Interpretation: Cannot rule out anterior infarct, age undetermined  Old EKG  Reviewed: unchanged when compared to the 12/16/2010 Labs Reviewed  CBC - Abnormal; Notable for the following:    WBC 13.4 (*)    All other components within normal limits  DIFFERENTIAL - Abnormal; Notable for the following:    Neutrophils Relative 83 (*)    Neutro Abs 11.2 (*)    Lymphocytes Relative 10 (*)    All other components within normal limits  COMPREHENSIVE METABOLIC PANEL - Abnormal; Notable for the following:    Potassium  3.2 (*)    Glucose, Bld 158 (*)    AST 150 (*)    ALT 106 (*)    Alkaline Phosphatase 152 (*)    GFR calc non Af Amer 89 (*)    All other components within normal limits  LIPASE, BLOOD - Abnormal; Notable for the following:    Lipase 5268 (*) RESULT CONFIRMED BY AUTOMATED DILUTION   All other components within normal limits  URINALYSIS, ROUTINE W REFLEX MICROSCOPIC  TROPONIN I   US Abdomen Complete  02/15/2011  *RADIOLOGY REPORT*  Clinical Data:  Abdominal pain, nausea, vomiting  COMPLETE ABDOMINAL ULTRASOUND  Comparison:  None.  Findings:  Gallbladder:  Multiple mobile gallstones are noted within gallbladder the largest measures 5 mm.  No thickening of the gallbladder wall.  No sonographic Murphy's sign.  Common bile duct:  Within normal limits measures 5 mm in diameter.  Liver:  Multiple hypoechoic cysts are noted within liver the largest in the left hepatic lobe measures 2.5 x 2.5 cm.  IVC:  Limited visualization due to abundant bowel gas  Pancreas:  Not visualized due to abundant bowel gas  Spleen:  Measures 7 cm in length.  Normal echogenicity.  Right Kidney:  Measures 7 cm in length.  No hydronephrosis or diagnostic renal calculus  Left Kidney:  Measures 11.6 cm in length.  No hydronephrosis or diagnostic renal calculus  Abdominal aorta: Not well visualized due to abundant bowel gas.  IMPRESSION:  1.  Mobile gallstones are noted within gallbladder the largest measures 5 mm.  No thickening of gallbladder wall or sonographic Murphy's sign. 2.  Normal CBD. 3.   Hepatic cysts are noted. 4.  No hydronephrosis or diagnostic renal calculus.  Original Report Authenticated By: Natasha Mead, M.D.   Dg Abd Acute W/chest  02/15/2011  *RADIOLOGY REPORT*  Clinical Data: Midsternal pain down to naval, nausea and vomiting, diverticulosis  ACUTE ABDOMEN SERIES (ABDOMEN 2 VIEW & CHEST 1 VIEW)  Comparison: 03/02/2007; abdominal MRI - 11/19/2008; AP chest radiograph - 12/16/2010  Findings:  Grossly unchanged cardiac silhouette and mediastinal contours post median sternotomy and CABG.  No focal parenchymal opacities.  No pleural effusion or pneumothorax.  Nonobstructive bowel gas pattern.  Moderate colonic stool burden. No pneumoperitoneum, pneumatosis or portal venous gas.  No acute osseous abnormalities.  IMPRESSION: 1.  Moderate colonic stool burden without evidence of obstruction.  2.  No acute cardiopulmonary disease.  Original Report Authenticated By: Waynard Reeds, M.D.    Medications  0.9 %  sodium chloride infusion (1000 mL Intravenous New Bag 02/15/11 1433)  HYDROmorphone (DILAUDID) injection 1 mg (1 mg Intravenous Given 02/15/11 1434)  ondansetron (ZOFRAN) injection 4 mg (4 mg Intravenous Given 02/15/11 1432)     1. Acute pancreatitis   2. Cholelithiasis       MDM  Patient appears to have acute pancreatitis as evidenced by his lipase. At this time his pain is under better control after IV narcotics. Patient denies any alcohol use. The likely etiology for his pancreatitis is the cholelithiasis. He will likely require ERCP at some point. At this point the patient be transferred for hospitalization. I will contact the tried hospitalist.        Celene Kras, MD 02/15/11 1600  Celene Kras, MD 02/15/11 1608  Temporary admission orders have been ordered.  Celene Kras, MD 02/15/11 [phone]

## 2011-02-16 DIAGNOSIS — K859 Acute pancreatitis without necrosis or infection, unspecified: Secondary | ICD-10-CM

## 2011-02-16 DIAGNOSIS — K811 Chronic cholecystitis: Secondary | ICD-10-CM

## 2011-02-16 LAB — COMPREHENSIVE METABOLIC PANEL
AST: 145 U/L — ABNORMAL HIGH (ref 0–37)
Alkaline Phosphatase: 133 U/L — ABNORMAL HIGH (ref 39–117)
BUN: 11 mg/dL (ref 6–23)
CO2: 26 mEq/L (ref 19–32)
Chloride: 101 mEq/L (ref 96–112)
Creatinine, Ser: 0.86 mg/dL (ref 0.50–1.35)
GFR calc non Af Amer: 82 mL/min — ABNORMAL LOW (ref >90)
Total Bilirubin: 0.5 mg/dL (ref 0.3–1.2)

## 2011-02-16 LAB — CARDIAC PANEL(CRET KIN+CKTOT+MB+TROPI)
CK, MB: 2.6 ng/mL (ref 0.3–4.0)
Relative Index: INVALID (ref 0.0–2.5)
Relative Index: INVALID (ref 0.0–2.5)
Total CK: 41 U/L (ref 7–232)
Total CK: 35 U/L (ref 7–232)
Troponin I: <0.3 ng/mL (ref <0.30)

## 2011-02-16 LAB — DIFFERENTIAL
Lymphocytes Relative: 8 % — ABNORMAL LOW (ref 12–46)
Monocytes Absolute: 0.9 10*3/uL (ref 0.1–1.0)
Monocytes Relative: 8 % (ref 3–12)
Neutro Abs: 9.4 10*3/uL — ABNORMAL HIGH (ref 1.7–7.7)

## 2011-02-16 LAB — CBC
HCT: 36.8 % — ABNORMAL LOW (ref 39.0–52.0)
Hemoglobin: 12.3 g/dL — ABNORMAL LOW (ref 13.0–17.0)
RBC: 4.25 MIL/uL (ref 4.22–5.81)
WBC: 11.3 10*3/uL — ABNORMAL HIGH (ref 4.0–10.5)

## 2011-02-16 MED ORDER — HYDROMORPHONE HCL PF 1 MG/ML IJ SOLN
1.0000 mg | INTRAMUSCULAR | Status: DC | PRN
  Administered 2011-02-16 (×2): 1 mg via INTRAVENOUS
  Filled 2011-02-16 (×2): qty 1

## 2011-02-16 MED ORDER — ONDANSETRON HCL 4 MG/2ML IJ SOLN
4.0000 mg | Freq: Four times a day (QID) | INTRAMUSCULAR | Status: DC | PRN
  Administered 2011-02-17: 4 mg via INTRAVENOUS
  Filled 2011-02-16: qty 2

## 2011-02-16 MED ORDER — ALBUTEROL SULFATE (5 MG/ML) 0.5% IN NEBU
2.5000 mg | INHALATION_SOLUTION | Freq: Two times a day (BID) | RESPIRATORY_TRACT | Status: DC
  Administered 2011-02-16 – 2011-02-18 (×5): 2.5 mg via RESPIRATORY_TRACT
  Filled 2011-02-16 (×3): qty 0.5

## 2011-02-16 MED ORDER — IPRATROPIUM BROMIDE 0.02 % IN SOLN
0.5000 mg | Freq: Two times a day (BID) | RESPIRATORY_TRACT | Status: DC
  Administered 2011-02-16 – 2011-02-23 (×13): 0.5 mg via RESPIRATORY_TRACT
  Filled 2011-02-16 (×14): qty 2.5

## 2011-02-16 MED ORDER — BUDESONIDE 0.25 MG/2ML IN SUSP
0.2500 mg | Freq: Two times a day (BID) | RESPIRATORY_TRACT | Status: DC
  Administered 2011-02-16 – 2011-02-23 (×14): 0.25 mg via RESPIRATORY_TRACT
  Filled 2011-02-16 (×16): qty 2

## 2011-02-16 NOTE — Consult Note (Signed)
Reason for Consult:Biliary pancreatitis Referring Physician: Rai   HPI: [name] is an 77 y.o. male who was admitted after awaking with significant abd pain early yesterday morning. The pain was in his upper abdomen. He has heart history and was concerned about an MI so he took some Nitro and OTC antacids with no relief. He went to [facility] and was found to have elevated LFTs including Lipase and gallstones on [place]. He was transferred to [facility] for admission. He feels a little better now with some medicine and rest. He actually reports an episode similar to this about 6 wks ago, though not as severeHis enzymes are better but surgery consult requested. He has a significant cardiac history with prior CABG and PTCS with new stent in March, 2012. He is also on Plavix, which has been held upon admission for poss surgery.  Past Medical History  Diagnosis Date  . CORONARY HEART DISEASE     a. s/p CABG;  b.  cath 06/27/10: S-Dx occluded, S-PDA 80-90% (tx with PCI); S-OM ok, L-LAD ok;  EF 65-70%  c.  s/p Promus DES to S-PDA 06/2010;   d. Myoview 8/12: low risk  . FIBRILLATION, ATRIAL     post op; ?documented during hosp. 06/2010  . SLEEP APNEA 09/2001    NPSG AHI 22/HR  . ALLERGIC RHINITIS   . ASTHMA   . Depression   . GERD   . DYSLIPIDEMIA   . HYPERTENSION     Echo 3/12: EF 55-60%; mod LVH; mild AS/AI; LAE; PASP 38; mild pulmo HTN  . Personal history of alcoholism   . Esophageal stricture   . Hiatal hernia   . Diverticulosis   . Benign liver cyst   . Skin cancer     L forearm  . Cataract     surgery to both eyes    Past Surgical History  Procedure Date  . Coronary artery bypass graft 02/2007  . Hernia repair 12/03/07  . Tonsillectomy   . Coronary angioplasty with stent placement 07/2010  . Cataract extraction, bilateral 2007  . Hernia repair unsure ?72's    Family History  Problem Relation Age of Onset  . COPD Sister   . Asthma Sister   . Emphysema Sister   . Hypertension Sister     . Hypertension Mother     Social History:  reports that he quit smoking about 31 years ago. His smoking use included Cigarettes. He does not have any smokeless tobacco history on file. He reports that he does not drink alcohol or use illicit drugs.  Allergies:  Allergies  Allergen Reactions  . Codeine Nausea And Vomiting    Medications:  Prior to Admission:  Prescriptions prior to admission  Medication Sig Dispense Refill  . albuterol (PROVENTIL) (2.5 MG/3ML) 0.083% nebulizer solution Take 3 mLs (2.5 mg total) by nebulization 2 (two) times daily. DX:  493.90  180 mL  5  . amLODipine (NORVASC) 5 MG tablet take 1 tablet by mouth twice a day  60 tablet  5  . aspirin 81 MG tablet Take 81 mg by mouth daily.       Marland Kitchen aspirin EC 81 MG tablet Take 162 mg by mouth once.        . bisoprolol (ZEBETA) 10 MG tablet Take 1 tablet (10 mg total) by mouth daily.  30 tablet  11  . budesonide (PULMICORT) 0.25 MG/2ML nebulizer solution Take 2 mLs (0.25 mg total) by nebulization 2 (two) times daily.  120 mL  11  . cetirizine (ZYRTEC) 10 MG tablet Take 10 mg by mouth daily.        . cloNIDine (CATAPRES) 0.3 MG tablet        . clopidogrel (PLAVIX) 75 MG tablet Take 1 tablet (75 mg total) by mouth daily.  30 tablet  11  . latanoprost (XALATAN) 0.005 % ophthalmic solution Place 1 drop into both eyes daily.        . nitroGLYCERIN (NITROSTAT) 0.4 MG SL tablet Place 0.4 mg under the tongue every 5 (five) minutes as needed. For chest pain      . NON FORMULARY Allergy Injection every Friday       . omeprazole (PRILOSEC) 20 MG capsule Take 20 mg by mouth daily.        . potassium chloride SA (K-DUR,KLOR-CON) 20 MEQ tablet Take 1 tablet (20 mEq total) by mouth 2 (two) times daily.  60 tablet  11  . pravastatin (PRAVACHOL) 80 MG tablet take 1 tablet by mouth every evening  30 tablet  7  . Probiotic Product (PHILLIPS COLON HEALTH PO) Take 1 tablet by mouth daily.         ROS: See HPI  Physical Exam: Blood  pressure 143/79, pulse 97, temperature 98.6 F (37 C), temperature source Oral, resp. rate 16, height 5\' 2"  (1.575 m), weight 148 lb 4.8 oz (67.268 kg), SpO2 91.00%.  General Appearance:  Alert, cooperative, no distress, appears stated age  Head:  Normocephalic, without obvious abnormality, atraumatic  ENT: Unremarkable  Neck: Supple, symmetrical, trachea midline, no adenopathy, thyroid: not enlarged, symmetric, no tenderness/mass/nodules, no carotid bruit or JVD  Back:   Symmetric, no curvature, ROM normal, no CVA tenderness  Lungs:   Clear to auscultation bilaterally, respirations unlabored  Chest Wall:  No tenderness or deformity, sternotomy scar well healed.  Heart:  Regular rate and rhythm, S1, S2 normal, no murmur, rub or gallop  Abdomen:   Soft, ND. Mildly tender epigastrum and RUQ  Genitalia:  Normal. No hernias  Rectal:  Deferred  Extremities: Extremities normal, atraumatic, no cyanosis or edema  Skin: Skin color, texture, turgor normal, no rashes or lesions  Breast:    N/A  Neurologic: Normal     Labs: Lab Results  Component Value Date   WBC 11.3* 02/16/2011   HGB 12.3* 02/16/2011   HCT 36.8* 02/16/2011   PLT 188 02/16/2011   GLUCOSE 120* 02/16/2011   CHOL 114 12/17/2010   TRIG 99 12/17/2010   HDL 35* 12/17/2010   LDLCALC 59 12/17/2010   ALT 164* 02/16/2011   AST 145* 02/16/2011   NA 137 02/16/2011   K 3.5 02/16/2011   CL 101 02/16/2011   CREATININE 0.86 02/16/2011   BUN 11 02/16/2011   CO2 26 02/16/2011   TSH 1.718 06/25/2010   PSA 0.62 10/10/2010   INR 1.00 12/16/2010   HGBA1C  Value: 6.0 (NOTE)   The ADA recommends the following therapeutic goals for glycemic   control related to Hgb A1C measurement:   Goal of Therapy:   < 7.0% Hgb A1C   Action Suggested:  > 8.0% Hgb A1C   Ref:  Diabetes Care, 22, Suppl. 1, 1999 02/26/2007   Lab Results  Component Value Date   LIPASE 1270* 02/16/2011       US Abdomen Complete  02/15/2011  *RADIOLOGY REPORT*  Clinical Data:  Abdominal pain,  nausea, vomiting  COMPLETE ABDOMINAL ULTRASOUND  Comparison:  None.  Findings:  Gallbladder:  Multiple mobile  gallstones are noted within gallbladder the largest measures 5 mm.  No thickening of the gallbladder wall.  No sonographic Murphy's sign.  Common bile duct:  Within normal limits measures 5 mm in diameter.  Liver:  Multiple hypoechoic cysts are noted within liver the largest in the left hepatic lobe measures 2.5 x 2.5 cm.  IVC:  Limited visualization due to abundant bowel gas  Pancreas:  Not visualized due to abundant bowel gas  Spleen:  Measures 7 cm in length.  Normal echogenicity.  Right Kidney:  Measures 7 cm in length.  No hydronephrosis or diagnostic renal calculus  Left Kidney:  Measures 11.6 cm in length.  No hydronephrosis or diagnostic renal calculus  Abdominal aorta: Not well visualized due to abundant bowel gas.  IMPRESSION:  1.  Mobile gallstones are noted within gallbladder the largest measures 5 mm.  No thickening of gallbladder wall or sonographic Murphy's sign. 2.  Normal CBD. 3.  Hepatic cysts are noted. 4.  No hydronephrosis or diagnostic renal calculus.  Original Report Authenticated By: Natasha Mead, M.D.   Assessment: Principal Problem:  *Pancreatitis, acute Active Problems: Cholelithiasis without cholecystitis by [place].  HYPERTENSION  CORONARY HEART DISEASE  FIBRILLATION, ATRIAL  ASTHMA  GERD  Plan: Continue NPO, IVF, LFTs and Lipase trending down. Agree with need for cardiology consult for pre-op clearance and to know if ok to come off Plavix, has been >62mos post PTCA. If cleared, will need to hold Plavix approx 5 days prior to surgery, today being day #2. Anticipate surgery Sun/Mon.   Marianna Fuss 02/16/2011, 2:30 PM

## 2011-02-16 NOTE — Progress Notes (Signed)
Subjective: Feels somewhat short of breath today, on the breathing treatment per RT. Patient states that the abdominal pain and nausea is better controlled now.  Objective: Weight change:   Intake/Output Summary (Last 24 hours) at 02/16/11 1218 Last data filed at 02/16/11 0100  Gross per 24 hour  Intake 1793.33 ml  Output    600 ml  Net 1193.33 ml   Blood pressure 139/75, pulse 97, temperature 99.6 F (37.6 C), temperature source Oral, resp. rate 18, height 5\' 2"  (1.575 m), weight 67.268 kg (148 lb 4.8 oz), SpO2 95.00%.  Physical Exam: General: Alert and awake, oriented x3, not in any acute distress. HEENT: anicteric sclera, pupils reactive to light and accommodation, EOMI CVS: S1-S2 clear, no murmur rubs or gallops Chest: clear to auscultation bilaterally, no wheezing Abdomen: soft, right upper quadrant tenderness to deep palpation  Extremities: no cyanosis, clubbing or edema noted bilaterally Neuro: Cranial nerves II-XII intact, no focal neurological deficits  Lab Results:  [facility] 02/16/11 0035 02/15/11 2103  WBC 11.3* 12.4*  HGB 12.3* 13.8  HCT 36.8* 40.1  PLT 188 188   BMET  Basename 02/16/11 0035 02/15/11 2103 02/15/11 1445  NA 137 -- 137  K 3.5 -- 3.2*  CL 101 -- 98  CO2 26 -- 28  GLUCOSE 120* -- 158*  BUN 11 -- 12  CREATININE 0.86 0.73 --  CALCIUM 8.3* -- 9.5    Micro Results: No results found for this or any previous visit (from the past 240 hour(s)).  Studies/Results: US Abdomen Complete  02/15/2011  *RADIOLOGY REPORT*  Clinical Data:  Abdominal pain, nausea, vomiting  COMPLETE ABDOMINAL ULTRASOUND  Comparison:  None.  Findings:  Gallbladder:  Multiple mobile gallstones are noted within gallbladder the largest measures 5 mm.  No thickening of the gallbladder wall.  No sonographic Murphy's sign.  Common bile duct:  Within normal limits measures 5 mm in diameter.  Liver:  Multiple hypoechoic cysts are noted within liver the largest in the left hepatic lobe  measures 2.5 x 2.5 cm.  IVC:  Limited visualization due to abundant bowel gas  Pancreas:  Not visualized due to abundant bowel gas  Spleen:  Measures 7 cm in length.  Normal echogenicity.  Right Kidney:  Measures 7 cm in length.  No hydronephrosis or diagnostic renal calculus  Left Kidney:  Measures 11.6 cm in length.  No hydronephrosis or diagnostic renal calculus  Abdominal aorta: Not well visualized due to abundant bowel gas.  IMPRESSION:  1.  Mobile gallstones are noted within gallbladder the largest measures 5 mm.  No thickening of gallbladder wall or sonographic Murphy's sign. 2.  Normal CBD. 3.  Hepatic cysts are noted. 4.  No hydronephrosis or diagnostic renal calculus.  Original Report Authenticated By: Natasha Mead, M.D.   Dg Abd Acute W/chest  02/15/2011  *RADIOLOGY REPORT*  Clinical Data: Midsternal pain down to naval, nausea and vomiting, diverticulosis  ACUTE ABDOMEN SERIES (ABDOMEN 2 VIEW & CHEST 1 VIEW)  Comparison: 03/02/2007; abdominal MRI - 11/19/2008; AP chest radiograph - 12/16/2010  Findings:  Grossly unchanged cardiac silhouette and mediastinal contours post median sternotomy and CABG.  No focal parenchymal opacities.  No pleural effusion or pneumothorax.  Nonobstructive bowel gas pattern.  Moderate colonic stool burden. No pneumoperitoneum, pneumatosis or portal venous gas.  No acute osseous abnormalities.  IMPRESSION: 1.  Moderate colonic stool burden without evidence of obstruction.  2.  No acute cardiopulmonary disease.  Original Report Authenticated By: Judene Companion.D.  Medications: Scheduled Meds:   . albuterol  2.5 mg Nebulization BID  . budesonide  0.25 mg Nebulization BID  . dextrose 5 % and 0.45% NaCl   Intravenous STAT  . enalaprilat  0.625 mg Intravenous Q6H  . enoxaparin  40 mg Subcutaneous QHS  . HYDROmorphone  1 mg Intravenous Once  . imipenem-cilastatin  500 mg Intravenous Q8H  . ipratropium  0.5 mg Nebulization BID  . ondansetron  4 mg Intravenous Once    . pantoprazole (PROTONIX) IV  40 mg Intravenous QHS   Continuous Infusions:   . dextrose 5 % and 0.45% NaCl 50 mL/hr at 02/16/11 0344  . DISCONTD: sodium chloride 1,000 mL (02/15/11 1433)   PRN Meds:.albuterol, HYDROmorphone (DILAUDID) injection, HYDROmorphone (DILAUDID) injection, ondansetron (ZOFRAN) IV, ondansetron (ZOFRAN) IV, DISCONTD:  HYDROmorphone (DILAUDID) injection  Assessment/Plan: 1. Acute pancreatitis: Likely secondary to gallstone pancreatitis. - Lipase elevated at 1270 LFTs also shows transamnitis. Patient denies any active alcohol use. Abdominal ultrasound on 02/15/2011 shows mobile gallstones in the gallbladder the largest 5 mL no thickening of gallbladder wall, no sonographic Murphy sign, normal CBD - Continue n.p.o., IV fluids, Primaxin IV. Obtain general surgery consult ( I have called) for recommendations.  2. history of COPD/asthma: - Will continue albuterol and Atrovent nebulizers twice a day, Pulmicort twice a day for his home schedule. Currently stable.  3. history of coronary disease: Stable history of CABG, cardiac catheter in March 2012, EF 65-70%. Patient's cardiologist is Dr. Antoine Poche from the [facility]ology. Patient is on aspirin, beta blocker, statins and Plavix. Currently on hold due to acute pancreatitis, if patient needs cholecystectomy, per surgery recommendations, will consult with cardiology for pre-op evaluation  4. hypertension: Currently stable, hold by mouth meds.   5. history of atrial fibrillation paroxysmal:  was postop in 3/12, currently rate controlled. Not on any anticoagulation outpatient, on aspirin and Plavix.  6. hyperlipidemia: Obtain fasting lipid panel on statins outpatient.  7. DVT prophylaxis on Lovenox  LOS: 1 day   Ottis Sarnowski 02/16/2011, 12:18 PM

## 2011-02-16 NOTE — Consult Note (Signed)
Pt seen and examined.  Anticipate lap chole next week when Plavix is out of system and pancreatitis improves.

## 2011-02-17 DIAGNOSIS — Z01810 Encounter for preprocedural cardiovascular examination: Secondary | ICD-10-CM

## 2011-02-17 LAB — COMPREHENSIVE METABOLIC PANEL
ALT: 90 U/L — ABNORMAL HIGH (ref 0–53)
Alkaline Phosphatase: 116 U/L (ref 39–117)
CO2: 26 mEq/L (ref 19–32)
Calcium: 9 mg/dL (ref 8.4–10.5)
Chloride: 99 mEq/L (ref 96–112)
GFR calc Af Amer: >90 mL/min (ref >90)
GFR calc non Af Amer: 85 mL/min — ABNORMAL LOW (ref >90)
Glucose, Bld: 95 mg/dL (ref 70–99)
Sodium: 136 mEq/L (ref 135–145)
Total Bilirubin: 0.6 mg/dL (ref 0.3–1.2)

## 2011-02-17 LAB — CBC
Hemoglobin: 13.4 g/dL (ref 13.0–17.0)
MCH: 29.1 pg (ref 26.0–34.0)
MCHC: 33.3 g/dL (ref 30.0–36.0)
Platelets: 181 10*3/uL (ref 150–400)
RDW: 14.3 % (ref 11.5–15.5)

## 2011-02-17 MED ORDER — METOPROLOL TARTRATE 1 MG/ML IV SOLN
2.5000 mg | INTRAVENOUS | Status: DC
  Administered 2011-02-17 – 2011-02-18 (×5): 2.5 mg via INTRAVENOUS
  Filled 2011-02-17 (×14): qty 5

## 2011-02-17 MED ORDER — ALBUTEROL SULFATE (5 MG/ML) 0.5% IN NEBU
INHALATION_SOLUTION | RESPIRATORY_TRACT | Status: AC
  Administered 2011-02-17: 2.5 mg via RESPIRATORY_TRACT
  Filled 2011-02-17: qty 0.5

## 2011-02-17 MED ORDER — POTASSIUM CHLORIDE 10 MEQ/100ML IV SOLN
10.0000 meq | INTRAVENOUS | Status: AC
  Administered 2011-02-17 (×3): 10 meq via INTRAVENOUS
  Filled 2011-02-17 (×3): qty 100

## 2011-02-17 MED ORDER — METOPROLOL TARTRATE 1 MG/ML IV SOLN
2.5000 mg | INTRAVENOUS | Status: DC | PRN
  Administered 2011-02-17 – 2011-02-18 (×2): 2.5 mg via INTRAVENOUS
  Filled 2011-02-17: qty 5

## 2011-02-17 MED ORDER — ASPIRIN 300 MG RE SUPP
300.0000 mg | Freq: Every day | RECTAL | Status: DC
  Administered 2011-02-17 – 2011-02-20 (×4): 300 mg via RECTAL
  Filled 2011-02-17 (×7): qty 1

## 2011-02-17 NOTE — Progress Notes (Signed)
Pt seen and examined.  Slowly improving.  Waiting for pancreatitis to improve and Plavix to wear off before lap chole.  Needs to ambulate.

## 2011-02-17 NOTE — Progress Notes (Signed)
Subjective:  Patient states that the abdominal pain and nausea is better controlled now. Shortness of breath improved  Objective: Weight change:   Intake/Output Summary (Last 24 hours) at 02/17/11 1055 Last data filed at 02/17/11 0607  Gross per 24 hour  Intake   2882 ml  Output   2475 ml  Net    407 ml   Blood pressure 188/63, pulse 92, temperature 97.6 F (36.4 C), temperature source Oral, resp. rate 16, height 5\' 2"  (1.575 m), weight 67.268 kg (148 lb 4.8 oz), SpO2 98.00%.  Physical Exam: General: Alert and awake, oriented x3, not in any acute distress. HEENT: anicteric sclera, pupils reactive to light and accommodation, EOMI CVS: S1-S2 clear, no murmur rubs or gallops Chest: clear to auscultation bilaterally, no wheezing Abdomen: soft, right upper quadrant tenderness to deep palpation  Extremities: no cyanosis, clubbing or edema noted bilaterally Neuro: Cranial nerves II-XII intact, no focal neurological deficits  Lab Results:  Basename 02/17/11 0405 02/16/11 0035  WBC 8.5 11.3*  HGB 13.4 12.3*  HCT 40.3 36.8*  PLT 181 188   BMET  Basename 02/17/11 0405 02/16/11 0035  NA 136 137  K 3.1* 3.5  CL 99 101  CO2 26 26  GLUCOSE 95 120*  BUN 7 11  CREATININE 0.78 0.86  CALCIUM 9.0 8.3*    Micro Results: No results found for this or any previous visit (from the past 240 hour(s)).  Studies/Results: US Abdomen Complete  02/15/2011  *RADIOLOGY REPORT*  Clinical Data:  Abdominal pain, nausea, vomiting  COMPLETE ABDOMINAL ULTRASOUND  Comparison:  None.  Findings:  Gallbladder:  Multiple mobile gallstones are noted within gallbladder the largest measures 5 mm.  No thickening of the gallbladder wall.  No sonographic Murphy's sign.  Common bile duct:  Within normal limits measures 5 mm in diameter.  Liver:  Multiple hypoechoic cysts are noted within liver the largest in the left hepatic lobe measures 2.5 x 2.5 cm.  IVC:  Limited visualization due to abundant bowel gas  Pancreas:   Not visualized due to abundant bowel gas  Spleen:  Measures 7 cm in length.  Normal echogenicity.  Right Kidney:  Measures 7 cm in length.  No hydronephrosis or diagnostic renal calculus  Left Kidney:  Measures 11.6 cm in length.  No hydronephrosis or diagnostic renal calculus  Abdominal aorta: Not well visualized due to abundant bowel gas.  IMPRESSION:  1.  Mobile gallstones are noted within gallbladder the largest measures 5 mm.  No thickening of gallbladder wall or sonographic Murphy's sign. 2.  Normal CBD. 3.  Hepatic cysts are noted. 4.  No hydronephrosis or diagnostic renal calculus.  Original Report Authenticated By: Natasha Mead, M.D.   Dg Abd Acute W/chest  02/15/2011  *RADIOLOGY REPORT*  Clinical Data: Midsternal pain down to naval, nausea and vomiting, diverticulosis  ACUTE ABDOMEN SERIES (ABDOMEN 2 VIEW & CHEST 1 VIEW)  Comparison: 03/02/2007; abdominal MRI - 11/19/2008; AP chest radiograph - 12/16/2010  Findings:  Grossly unchanged cardiac silhouette and mediastinal contours post median sternotomy and CABG.  No focal parenchymal opacities.  No pleural effusion or pneumothorax.  Nonobstructive bowel gas pattern.  Moderate colonic stool burden. No pneumoperitoneum, pneumatosis or portal venous gas.  No acute osseous abnormalities.  IMPRESSION: 1.  Moderate colonic stool burden without evidence of obstruction.  2.  No acute cardiopulmonary disease.  Original Report Authenticated By: Waynard Reeds, M.D.    Medications: Scheduled Meds:    . albuterol  2.5 mg Nebulization  BID  . budesonide  0.25 mg Nebulization BID  . enalaprilat  0.625 mg Intravenous Q6H  . enoxaparin  40 mg Subcutaneous QHS  . imipenem-cilastatin  500 mg Intravenous Q8H  . ipratropium  0.5 mg Nebulization BID  . pantoprazole (PROTONIX) IV  40 mg Intravenous QHS   Continuous Infusions:    . dextrose 5 % and 0.45% NaCl 50 mL/hr (02/17/11 0404)   PRN Meds:.albuterol, HYDROmorphone (DILAUDID) injection, ondansetron  (ZOFRAN) IV  Assessment/Plan: 1. Acute pancreatitis: Likely secondary to gallstone pancreatitis. - Lipase  now elevated at 5268, trending up.  LFTs also shows transamnitis and trending up. Abdominal ultrasound on 02/15/2011 shows mobile gallstones in the gallbladder the largest 5 mL no thickening of gallbladder wall, no sonographic Murphy sign, normal CBD - Continue n.p.o., IV fluids, Primaxin IV.  - General surgery following and recommended laparoscopic cholecystectomy. Patient had a cardiac catheterization in March 2012 and had drug-eluting stent, he is on aspirin and Plavix will which is currently on hold day #3. I will obtain cardiology consult for preop cardiac clearance and have called [facility] (Dr. Antoine Poche is patient's primary cardiologist).   2. history of COPD/asthma: - Will continue albuterol and Atrovent nebulizers twice a day, Pulmicort twice a day for his home schedule. Currently stable.  3. history of coronary disease: Stable history of CABG, cardiac catheter in March 2012, EF 65-70%.  Patient is on aspirin, beta blocker, statins and Plavix, currently on hold due to acute pancreatitis, if patient needs cholecystectomy, per surgery recommendations, will consult with cardiology for pre-op evaluation  4. hypertension: Currently stable, hold by mouth meds.   5. history of atrial fibrillation paroxysmal:  was postop in 3/12, currently rate controlled. Not on any anticoagulation outpatient, on aspirin and Plavix.  6. hypokalemia replaced  7. DVT prophylaxis on Lovenox  LOS: 1 day   Slate Debroux 02/17/2011, 10:55 AM

## 2011-02-17 NOTE — Consult Note (Signed)
CARDIOLOGY CONSULT NOTE  Patient ID: [name] MRN: [id number] DOB/AGE: 08-08-1930 77 y.o.  Admit date: 02/15/2011 Referring Physician  Dr Isidoro Donning Primary Physician: Felicity Coyer Primary Cardiologist  [facility] Reason for Consultation  Preop eval  HPI:    [name] is a 77 y.o. male with a history of CAD.   [name] was admitted in September of 2012 with chest pain. He ruled out for an MI and a Myoview showed mild reversibility but was considered a low risk study and medical therapy was recommended. Since then he has done well. He has not needed nitroglycerin since then. He has been doing his usual level of activity. While on vacation he had some problems with diet compliance but generally eat a heart healthy, low salt and low fat, diet.   His major presenting symptom this time was abdominal pain associated with nausea and vomiting. Since being in the hospital, his condition has greatly improved. Currently he is resting comfortably. He has not had any recent chest pain and denies shortness of breath.       Past Medical History  Diagnosis Date  . CORONARY HEART DISEASE     a. s/p CABG;   b.  cath 06/27/10: S-Dx occluded, S-PDA 80-90% (tx with PCI); S-OM ok, L-LAD ok;  EF 65-70%  c.  s/p Promus DES to S-PDA 06/2010;    d. Myoview 8/12:  mild reversibility in anterior apex, anterior wall and   anterolateral wall with an EF of 69%. low risk  . FIBRILLATION, ATRIAL     post op; ?documented during hosp. 06/2010  . SLEEP APNEA 09/2001    NPSG AHI 22/HR  . ALLERGIC RHINITIS   . ASTHMA   . Depression   . GERD   . DYSLIPIDEMIA   . HYPERTENSION     Echo 3/12: EF 55-60%; mod LVH; mild AS/AI; LAE; PASP 38; mild pulmo HTN  . Personal history of alcoholism   . Esophageal stricture   . Hiatal hernia   . Diverticulosis   . Benign liver cyst   . Skin cancer     L forearm  . Cataract     surgery to both eyes    Past Surgical History  Procedure Date  . Coronary artery bypass graft 02/2007  . Hernia  repair 12/03/07  . Tonsillectomy   . Coronary angioplasty with stent placement 07/2010  . Cataract extraction, bilateral 2007  . Hernia repair unsure ?60's    Allergies  Allergen Reactions  . Codeine Nausea And Vomiting     Scheduled:   . albuterol  2.5 mg Nebulization BID  . budesonide  0.25 mg Nebulization BID  . enalaprilat  0.625 mg Intravenous Q6H  . enoxaparin  40 mg Subcutaneous QHS  . imipenem-cilastatin  500 mg Intravenous Q8H  . ipratropium  0.5 mg Nebulization BID  . pantoprazole (PROTONIX) IV  40 mg Intravenous QHS  . potassium chloride  10 mEq Intravenous Q1 Hr x 3    History   Social History  . Marital Status: Married    Spouse Name: N/A    Number of Children: 2  . Years of Education: N/A   Occupational History  . retired     17 years   Social History Main Topics  . Smoking status: Former Smoker    Types: Cigarettes    Quit date: 04/11/1979  . Smokeless tobacco: Not on file  . Alcohol Use: No  . Drug Use: No  . Sexually Active: Yes  Other Topics Concern  . 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 Married lives with wife 2 children    Family History  Problem Relation Age of Onset  . COPD Sister   . Asthma Sister   . Emphysema Sister   . Hypertension Sister   . Hypertension Mother     ROS: Are to admission he had abdominal pain associated with nausea and vomiting. He did not have any diarrhea. There was no melena and he denies hematochezia. He has not had fevers or chills. He has had some lower extremity edema, specifically pedal edema but gets that mainly during the day in his feet. His dyspnea on exertion is chronic but unchanged recently.  Full 14 point review of systems complete and found to be negative unless listed  above  Physical Exam: Blood pressure 188/63, pulse 92, temperature 97.6 F (36.4 C), temperature source Oral, resp. rate 16, height 5\' 2"  (1.575 m), weight 148 lb 4.8 oz (67.268 kg), SpO2  98.00%.    General: Well developed, well nourished, in no acute distress Head: Eyes PERRLA, No xanthomas.   Normal cephalic and atraumatic. Edentulous Lungs: Clear bilaterally to auscultation and percussion, few dry rales. Heart: HRRR S1 S2, soft systolic murmur.  Pulses are 2+ & equal.           Bilateral carotid bruits. No JVD.  No abdominal bruits Abdomen: Bowel sounds are positive, abdomen soft and tender  Msk:  Back normal, normal gait. Normal strength and tone for age. Extremities: No clubbing, cyanosis or edema.  DP +1 Neuro: Alert and oriented X 3. Psych:  Good affect, responds appropriately  Labs:  [facility] 02/17/11 0405 02/16/11 0035 02/15/11 2103  NA 136 137 --  K 3.1* 3.5 --  CL 99 101 --  CO2 26 26 --  GLUCOSE 95 120* --  BUN 7 11 --  CREATININE 0.78 0.86 --  CALCIUM 9.0 8.3* --  MG -- -- 1.9  PHOS -- -- --    Basename 02/17/11 0405 02/16/11 0035  AST 40* 145*  ALT 90* 164*  ALKPHOS 116 133*  BILITOT 0.6 0.5  PROT 6.8 6.2  ALBUMIN 3.3* 3.3*    Basename 02/17/11 0405 02/16/11 0035  LIPASE 201* 1270*  AMYLASE -- --    Basename 02/17/11 0405 02/16/11 0035 02/15/11 1445  WBC 8.5 11.3* --  NEUTROABS -- 9.4* 11.2*  HGB 13.4 12.3* --  HCT 40.3 36.8* --  MCV 87.6 86.6 --  PLT 181 188 --    Basename 02/16/11 0814 02/16/11 0035 02/15/11 2102  CKTOTAL 35 41 54  CKMB 2.1 2.6 3.3  CKMBINDEX -- -- --  TROPONINI <0.30 <0.30 <0.30   Radiology: Abdominal and chest x-ray IMPRESSION: 1. Moderate colonic stool burden without evidence of obstruction.  2. No acute cardiopulmonary disease.   EKG: Sinus rhythm with Premature atrial complexes Incomplete right bundle branch block Cannot rule out Anterior infarct , age undetermined Abnormal ECG Since previous tracing pacs are now evident  Vent. rate 67 BPM PR interval 176 ms QRS duration 110 ms QT/QTc 430/454 ms P-R-T axes 37 18 30   ASSESSMENT AND PLAN:   The patient was seen today by Olga Millers,  MD, the patient evaluated and the data reviewed. His Drew is a 78 year old male with a history of coronary artery disease. He was admitted him about pain and diagnosed with gallstone pancreatitis. His cardiac enzymes were cycled and were negative for MI. His  lipase  has improved since his admission. His LFTs are trending down. His potassium was slightly low and has been supplemented.  His last intervention was in March of 2012. He has a recent stress test that was without significant ischemia. His EF is normal. Has no recent ischemic symptoms despite the stress of his illness. Surgery is planned for next week after holding Plavix for 5 days. His only anti-coagulation is DVT Lovenox. It would be appropriate to continue aspirin at 81 mg daily, if OK with the surgeons.. We will encourage resumption of Plavix as soon as possible after the surgery  because he had a a drug-eluting stent less than one year ago. We will continue to follow him closely with you.   Signed: Theodore Demark 02/17/2011, 11:56 AM Co-Sign MD 77 year old male with past medical history of coronary artery disease status post coronary artery bypass and graft, hypertension and hyperlipidemia and now with gallstone pancreatitis who we are asked to evaluate preoperatively. Note his last procedure was in March of 2012. At that time he had a drug-eluting stent in his saphenous vein graft to the PDA. The patient had a Myoview performed in September of 2012 secondary to chest pain. It revealed mild reversibility in the anterior apex, anterior wall and anterolateral wall with an ejection fraction of 69%. It was felt to be low risk and he has been treated medically. The patient was admitted on November 7 with complaints of abdominal pain, nausea and vomiting. He has been found to have all stone pancreatitis. He will require cholecystectomy and we were asked to evaluate preoperatively. Note he has not had dyspnea on exertion, orthopnea, PND,  palpitations, syncope or exertional chest pain. Given recent low risk Myoview patient may proceed with surgery. He had a drug-eluting stent placed in March of 2012. His Plavix has been held. I would recommend adding aspirin suppository until he is able to take by mouth. Resume Plavix postoperatively when hemostasis achieved and OK with surgery. At IV beta blocker (2.5 mg of metoprolol IV every 6 hours in) until patient taking by mouth medications. We will follow. Olga Millers

## 2011-02-17 NOTE — Progress Notes (Signed)
Subjective: Pt ok, getting hungry. No N/V. Pain about same, maybe sl better Cardiology clearance pending.  Objective: Vital signs in last 24 hours: Temp:  [97.6 F (36.4 C)-98.6 F (37 C)] 97.6 F (36.4 C) (11/09 0540) Pulse Rate:  [92-97] 92  (11/09 0540) Resp:  [16-18] 16  (11/09 0540) BP: (143-188)/(63-84) 188/63 mmHg (11/09 0540) SpO2:  [91 %-98 %] 98 % (11/09 0756) Last BM Date:  (Pt stated three days ago)  Intake/Output this shift:    Physical Exam: Abdomen: sift, still mildly tender epigastrum and RUQ. No mass  Labs: CBC  Basename 02/17/11 0405 02/16/11 0035  WBC 8.5 11.3*  HGB 13.4 12.3*  HCT 40.3 36.8*  PLT 181 188   BMET  Basename 02/17/11 0405 02/16/11 0035  NA 136 137  K 3.1* 3.5  CL 99 101  CO2 26 26  GLUCOSE 95 120*  BUN 7 11  CREATININE 0.78 0.86  CALCIUM 9.0 8.3*  LIPASE 201* 1270*   Bilirubin  0.6  PT/INR No results found for this basename: LABPROT:2,INR:2 in the last 72 hours ABG No results found for this basename: PHART:2,PCO2:2,PO2:2,HCO3:2 in the last 72 hours  Studies/Results: US Abdomen Complete  02/15/2011  *RADIOLOGY REPORT*  Clinical Data:  Abdominal pain, nausea, vomiting  COMPLETE ABDOMINAL ULTRASOUND  Comparison:  None.  Findings:  Gallbladder:  Multiple mobile gallstones are noted within gallbladder the largest measures 5 mm.  No thickening of the gallbladder wall.  No sonographic Murphy's sign.  Common bile duct:  Within normal limits measures 5 mm in diameter.  Liver:  Multiple hypoechoic cysts are noted within liver the largest in the left hepatic lobe measures 2.5 x 2.5 cm.  IVC:  Limited visualization due to abundant bowel gas  Pancreas:  Not visualized due to abundant bowel gas  Spleen:  Measures 7 cm in length.  Normal echogenicity.  Right Kidney:  Measures 7 cm in length.  No hydronephrosis or diagnostic renal calculus  Left Kidney:  Measures 11.6 cm in length.  No hydronephrosis or diagnostic renal calculus  Abdominal  aorta: Not well visualized due to abundant bowel gas.  IMPRESSION:  1.  Mobile gallstones are noted within gallbladder the largest measures 5 mm.  No thickening of gallbladder wall or sonographic Murphy's sign. 2.  Normal CBD. 3.  Hepatic cysts are noted. 4.  No hydronephrosis or diagnostic renal calculus.  Original Report Authenticated By: Natasha Mead, M.D.   Dg Abd Acute W/chest  02/15/2011  *RADIOLOGY REPORT*  Clinical Data: Midsternal pain down to naval, nausea and vomiting, diverticulosis  ACUTE ABDOMEN SERIES (ABDOMEN 2 VIEW & CHEST 1 VIEW)  Comparison: 03/02/2007; abdominal MRI - 11/19/2008; AP chest radiograph - 12/16/2010  Findings:  Grossly unchanged cardiac silhouette and mediastinal contours post median sternotomy and CABG.  No focal parenchymal opacities.  No pleural effusion or pneumothorax.  Nonobstructive bowel gas pattern.  Moderate colonic stool burden. No pneumoperitoneum, pneumatosis or portal venous gas.  No acute osseous abnormalities.  IMPRESSION: 1.  Moderate colonic stool burden without evidence of obstruction.  2.  No acute cardiopulmonary disease.  Original Report Authenticated By: Waynard Reeds, M.D.     Assessment: Principal Problem:  *Pancreatitis, acute, secondary to gallstones Active Problems:  HYPERTENSION  CORONARY HEART DISEASE  FIBRILLATION, ATRIAL  ASTHMA  GERD  Plan: Continue to hold Plavix, today is day #3 off. Enzymes trending down., AT most, would allow sips of clears. Await Cards clearance, anticipate OR next Mon  LOS: 2 days  Marianna Fuss 02/17/2011

## 2011-02-17 NOTE — Clinical Documentation Improvement (Signed)
GENERIC DOCUMENTATION CLARIFICATION QUERY  Please update your documentation within the medical record to reflect your response to this query.                                                                                         02/17/11    Dear [facility],  In a better effort to capture your patient's severity of illness, reflect appropriate length of stay and utilization of resources, a review of the patient medical record has revealed the following indicators.     Pt with Gallstone Pancreatitis,  According to H/P pt with hypertension. Please clarify whether or not HTN can be further specified as one of the diagnoses listed below and document in pn or d/c summary.  Malignant HTN  Accelerated HTN  Portal HTN  _______Other Condition__________________ _______Cannot Clinically Determine     Risk Factors: Gallstone pancreatitis;  Hypokalemia; HTN; dehydration; CAD; H/O CABG  Diagnostics 02/16/11 Blood Pressures 188/63     154/84     178/84    143/79   Treatment: Enalaprilat       You may use possible, probable, or suspect with inpatient documentation. possible, probable, suspected diagnoses MUST be documented at the time of discharge  Based on your clinical judgment, please clarify and document in a progress note and/or discharge summary the clinical condition associated with the following supporting information:  In responding to this query please exercise your independent judgment.  The fact that a query is asked, does not imply that any particular answer is desired or expected.  Reviewed:  no additional documentation provided 02/22/11  Thank You,  Sincerely, Enis Slipper  Clinical Documentation Specialist:  Pager [phone]  Health Information Management [place]

## 2011-02-18 DIAGNOSIS — I10 Essential (primary) hypertension: Secondary | ICD-10-CM

## 2011-02-18 LAB — CBC
HCT: 45.4 % (ref 39.0–52.0)
Hemoglobin: 15.1 g/dL (ref 13.0–17.0)
MCHC: 33.3 g/dL (ref 30.0–36.0)
MCV: 87 fL (ref 78.0–100.0)
RDW: 13.8 % (ref 11.5–15.5)

## 2011-02-18 LAB — COMPREHENSIVE METABOLIC PANEL
ALT: 61 U/L — ABNORMAL HIGH (ref 0–53)
Albumin: 3.6 g/dL (ref 3.5–5.2)
Alkaline Phosphatase: 117 U/L (ref 39–117)
Calcium: 9.7 mg/dL (ref 8.4–10.5)
Potassium: 3.2 mEq/L — ABNORMAL LOW (ref 3.5–5.1)
Sodium: 136 mEq/L (ref 135–145)
Total Protein: 7.3 g/dL (ref 6.0–8.3)

## 2011-02-18 MED ORDER — METOPROLOL TARTRATE 1 MG/ML IV SOLN
5.0000 mg | INTRAVENOUS | Status: DC
  Administered 2011-02-18 – 2011-02-21 (×18): 5 mg via INTRAVENOUS
  Filled 2011-02-18 (×24): qty 5

## 2011-02-18 MED ORDER — POTASSIUM CHLORIDE 10 MEQ/100ML IV SOLN
10.0000 meq | INTRAVENOUS | Status: AC
  Administered 2011-02-18 (×3): 10 meq via INTRAVENOUS
  Filled 2011-02-18 (×3): qty 100

## 2011-02-18 NOTE — Progress Notes (Signed)
Subjective: Denies CP or SOB; complains of reflux   Physical Exam:   Blood pressure 167/96, pulse 110, temperature 97.2 F (36.2 C), temperature source Oral, resp. rate 16, height 5\' 2"  (1.575 m), weight 148 lb 4.8 oz (67.268 kg), SpO2 96.00%. Temp:  [97.2 F (36.2 C)-98.4 F (36.9 C)] 97.2 F (36.2 C) (11/10 0510) Pulse Rate:  [92-112] 110  (11/10 0510) Resp:  [14-18] 16  (11/10 0510) BP: (156-183)/(76-103) 167/96 mmHg (11/10 0700) SpO2:  [96 %-100 %] 96 % (11/10 0510) Weight change:  I/O 11/09 0701 - 11/10 0700 In: 1450 [I.V.:1450] Out: 1750 [Urine:1750]  HEENT-normal/normal eyelids Neck supple chest - CTA/ normal expansion CV - RRR/normal S1 and S2; no murmurs, rubs or gallops Abdomen -Mild tenderness RUQ, no HSM, no mass, + bowel sounds, no bruit 2+ femoral pulses, no bruits Ext-no edema, chords, 2+ DP Neuro-grossly nonfocal  Results for orders placed during the hospital encounter of 02/15/11 (from the past 48 hour(s))  CARDIAC PANEL(CRET KIN+CKTOT+MB+TROPI)     Status: Normal   Collection Time   02/16/11  8:14 AM      Component Value Range Comment   Total CK 35  7 - 232 (U/L)    CK, MB 2.1  0.3 - 4.0 (ng/mL)    Troponin I <0.30  <0.30 (ng/mL)    Relative Index RELATIVE INDEX IS INVALID  0.0 - 2.5    LIPASE, BLOOD     Status: Abnormal   Collection Time   02/17/11  4:05 AM      Component Value Range Comment   Lipase 201 (*) 11 - 59 (U/L)   COMPREHENSIVE METABOLIC PANEL     Status: Abnormal   Collection Time   02/17/11  4:05 AM      Component Value Range Comment   Sodium 136  135 - 145 (mEq/L)    Potassium 3.1 (*) 3.5 - 5.1 (mEq/L)    Chloride 99  96 - 112 (mEq/L)    CO2 26  19 - 32 (mEq/L)    Glucose, Bld 95  70 - 99 (mg/dL)    BUN 7  6 - 23 (mg/dL)    Creatinine, Ser 1.91  0.50 - 1.35 (mg/dL)    Calcium 9.0  8.4 - 10.5 (mg/dL)    Total Protein 6.8  6.0 - 8.3 (g/dL)    Albumin 3.3 (*) 3.5 - 5.2 (g/dL)    AST 40 (*) 0 - 37 (U/L)    ALT 90 (*) 0 - 53 (U/L)    Alkaline Phosphatase 116  39 - 117 (U/L)    Total Bilirubin 0.6  0.3 - 1.2 (mg/dL)    GFR calc non Af Amer 85 (*) >90 (mL/min)    GFR calc Af Amer >90  >90 (mL/min)   CBC     Status: Normal   Collection Time   02/17/11  4:05 AM      Component Value Range Comment   WBC 8.5  4.0 - 10.5 (K/uL)    RBC 4.60  4.22 - 5.81 (MIL/uL)    Hemoglobin 13.4  13.0 - 17.0 (g/dL)    HCT 47.8  29.5 - 62.1 (%)    MCV 87.6  78.0 - 100.0 (fL)    MCH 29.1  26.0 - 34.0 (pg)    MCHC 33.3  30.0 - 36.0 (g/dL)    RDW 30.8  65.7 - 84.6 (%)    Platelets 181  150 - 400 (K/uL)   COMPREHENSIVE METABOLIC PANEL  Status: Abnormal   Collection Time   02/18/11  4:20 AM      Component Value Range Comment   Sodium 136  135 - 145 (mEq/L)    Potassium 3.2 (*) 3.5 - 5.1 (mEq/L)    Chloride 98  96 - 112 (mEq/L)    CO2 26  19 - 32 (mEq/L)    Glucose, Bld 110 (*) 70 - 99 (mg/dL)    BUN 10  6 - 23 (mg/dL)    Creatinine, Ser 1.61  0.50 - 1.35 (mg/dL)    Calcium 9.7  8.4 - 10.5 (mg/dL)    Total Protein 7.3  6.0 - 8.3 (g/dL)    Albumin 3.6  3.5 - 5.2 (g/dL)    AST 21  0 - 37 (U/L)    ALT 61 (*) 0 - 53 (U/L)    Alkaline Phosphatase 117  39 - 117 (U/L)    Total Bilirubin 0.7  0.3 - 1.2 (mg/dL)    GFR calc non Af Amer 87 (*) >90 (mL/min)    GFR calc Af Amer >90  >90 (mL/min)   CBC     Status: Normal   Collection Time   02/18/11  4:20 AM      Component Value Range Comment   WBC 8.3  4.0 - 10.5 (K/uL)    RBC 5.22  4.22 - 5.81 (MIL/uL)    Hemoglobin 15.1  13.0 - 17.0 (g/dL)    HCT 09.6  04.5 - 40.9 (%)    MCV 87.0  78.0 - 100.0 (fL)    MCH 28.9  26.0 - 34.0 (pg)    MCHC 33.3  30.0 - 36.0 (g/dL)    RDW 81.1  91.4 - 78.2 (%)    Platelets 208  150 - 400 (K/uL)   LIPASE, BLOOD     Status: Abnormal   Collection Time   02/18/11  4:20 AM      Component Value Range Comment   Lipase 119 (*) 11 - 59 (U/L)     No results found.  Assessment/Plan Patient Active Hospital Problem List: Pancreatitis, acute (02/16/2011) For  cholecystectomy Monday HYPERTENSION (05/10/2007) Blood pressure elevated; increase metoprolol to 5 mg IV every six hours; resume PO meds postop CORONARY HEART DISEASE (05/10/2007) Continue ASA suppository while NPO. Resume plavix postop when ok with surgery. Hypokalemia - supplement.   Olga Millers MD 02/18/2011, 8:03 AM

## 2011-02-18 NOTE — Progress Notes (Signed)
  Subjective: Not having any more abd pain.  Having heartburn  Objective: Vital signs in last 24 hours: Temp:  [97.2 F (36.2 C)-98.4 F (36.9 C)] 97.2 F (36.2 C) (11/10 0510) Pulse Rate:  [92-112] 110  (11/10 0510) Resp:  [14-18] 16  (11/10 0510) BP: (156-183)/(76-103) 167/96 mmHg (11/10 0700) SpO2:  [96 %-100 %] 96 % (11/10 0510) Last BM Date: 02/15/11  Intake/Output from previous day: 11/09 0701 - 11/10 0700 In: 1450 [I.V.:1450] Out: 1750 [Urine:1750] Intake/Output this shift:    PE: ABD- soft, nontender this am Lab Results:   [facility] 02/18/11 0420 02/17/11 0405  WBC 8.3 8.5  HGB 15.1 13.4  HCT 45.4 40.3  PLT 208 181   BMET  Basename 02/18/11 0420 02/17/11 0405  NA 136 136  K 3.2* 3.1*  CL 98 99  CO2 26 26  GLUCOSE 110* 95  BUN 10 7  CREATININE 0.73 0.78  CALCIUM 9.7 9.0   Mild increase in AST and ALT PT/INR No results found for this basename: LABPROT:2,INR:2 in the last 72 hours ABG No results found for this basename: PHART:2,PCO2:2,PO2:2,HCO3:2 in the last 72 hours  Studies/Results: No results found.  Anti-infectives: Anti-infectives     Start     Dose/Rate Route Frequency Ordered Stop   02/15/11 2200   imipenem-cilastatin (PRIMAXIN) 500 mg in sodium chloride 0.9 % 100 mL IVPB        500 mg 200 mL/hr over 30 Minutes Intravenous 3 times per day 02/15/11 2011            Assessment Principal Problem:  *Pancreatitis, acute (gallstone)-continues to slowly improve Active Problems:  HYPERTENSION  CORONARY HEART DISEASE  FIBRILLATION, ATRIAL GERd sxs      LOS: 3 days   Plan: Sips of liquids,  Recheck lab in AM  Aleesa Sweigert J 02/18/2011

## 2011-02-18 NOTE — Plan of Care (Signed)
Problem: Phase I Progression Outcomes Goal: Pain controlled with appropriate interventions Outcome: Progressing Pt requests pain meds as needed.

## 2011-02-18 NOTE — Progress Notes (Signed)
Subjective:  s: No complaints  Objective: Weight change:   Intake/Output Summary (Last 24 hours) at 02/18/11 1024 Last data filed at 02/18/11 0900  Gross per 24 hour  Intake   1450 ml  Output   1650 ml  Net   -200 ml   Blood pressure 156/88, pulse 110, temperature 97.2 F (36.2 C), temperature source Oral, resp. rate 16, height 5\' 2"  (1.575 m), weight 67.268 kg (148 lb 4.8 oz), SpO2 97.00%.  Physical Exam: General: Alert and awake, oriented x3, not in any acute distress. HEENT: anicteric sclera, pupils reactive to light and accommodation, EOMI CVS: S1-S2 clear, no murmur rubs or gallops Chest: clear to auscultation bilaterally, no wheezing Abdomen: soft, no significant quadrant tenderness to deep palpation  Extremities: no cyanosis, clubbing or edema noted bilaterally Neuro: Cranial nerves II-XII intact, no focal neurological deficits  Lab Results:  Basename 02/18/11 0420 02/17/11 0405  WBC 8.3 8.5  HGB 15.1 13.4  HCT 45.4 40.3  PLT 208 181   BMET  Basename 02/18/11 0420 02/17/11 0405  NA 136 136  K 3.2* 3.1*  CL 98 99  CO2 26 26  GLUCOSE 110* 95  BUN 10 7  CREATININE 0.73 0.78  CALCIUM 9.7 9.0    Micro Results: No results found for this or any previous visit (from the past 240 hour(s)).  Studies/Results: US Abdomen Complete  02/15/2011  *RADIOLOGY REPORT*  Clinical Data:  Abdominal pain, nausea, vomiting  COMPLETE ABDOMINAL ULTRASOUND  Comparison:  None.  Findings:  Gallbladder:  Multiple mobile gallstones are noted within gallbladder the largest measures 5 mm.  No thickening of the gallbladder wall.  No sonographic Murphy's sign.  Common bile duct:  Within normal limits measures 5 mm in diameter.  Liver:  Multiple hypoechoic cysts are noted within liver the largest in the left hepatic lobe measures 2.5 x 2.5 cm.  IVC:  Limited visualization due to abundant bowel gas  Pancreas:  Not visualized due to abundant bowel gas  Spleen:  Measures 7 cm in length.  Normal  echogenicity.  Right Kidney:  Measures 7 cm in length.  No hydronephrosis or diagnostic renal calculus  Left Kidney:  Measures 11.6 cm in length.  No hydronephrosis or diagnostic renal calculus  Abdominal aorta: Not well visualized due to abundant bowel gas.  IMPRESSION:  1.  Mobile gallstones are noted within gallbladder the largest measures 5 mm.  No thickening of gallbladder wall or sonographic Murphy's sign. 2.  Normal CBD. 3.  Hepatic cysts are noted. 4.  No hydronephrosis or diagnostic renal calculus.  Original Report Authenticated By: Natasha Mead, M.D.   Dg Abd Acute W/chest  02/15/2011  *RADIOLOGY REPORT*  Clinical Data: Midsternal pain down to naval, nausea and vomiting, diverticulosis  ACUTE ABDOMEN SERIES (ABDOMEN 2 VIEW & CHEST 1 VIEW)  Comparison: 03/02/2007; abdominal MRI - 11/19/2008; AP chest radiograph - 12/16/2010  Findings:  Grossly unchanged cardiac silhouette and mediastinal contours post median sternotomy and CABG.  No focal parenchymal opacities.  No pleural effusion or pneumothorax.  Nonobstructive bowel gas pattern.  Moderate colonic stool burden. No pneumoperitoneum, pneumatosis or portal venous gas.  No acute osseous abnormalities.  IMPRESSION: 1.  Moderate colonic stool burden without evidence of obstruction.  2.  No acute cardiopulmonary disease.  Original Report Authenticated By: Waynard Reeds, M.D.    Medications: Scheduled Meds:    . albuterol  2.5 mg Nebulization BID  . aspirin  300 mg Rectal Daily  . budesonide  0.25  mg Nebulization BID  . enalaprilat  0.625 mg Intravenous Q6H  . enoxaparin  40 mg Subcutaneous QHS  . imipenem-cilastatin  500 mg Intravenous Q8H  . ipratropium  0.5 mg Nebulization BID  . metoprolol  5 mg Intravenous Q4H  . pantoprazole (PROTONIX) IV  40 mg Intravenous QHS  . potassium chloride  10 mEq Intravenous Q1 Hr x 3  . potassium chloride  10 mEq Intravenous Q1 Hr x 3  . DISCONTD: metoprolol  2.5 mg Intravenous Q4H   Continuous  Infusions:    . dextrose 5 % and 0.45% NaCl 50 mL/hr at 02/18/11 0614   PRN Meds:.albuterol, HYDROmorphone (DILAUDID) injection, metoprolol, ondansetron (ZOFRAN) IV  Assessment/Plan: 1. Acute pancreatitis: Likely secondary to gallstone pancreatitis. - Lipase  now down to 119.   - Continue n.p.o., IV fluids, Primaxin IV.  - General surgery following and recommended laparoscopic cholecystectomy.  - Cleared by cardiology for laparoscopic cholecystectomy  2. history of COPD/asthma: - Will continue albuterol and Atrovent nebulizers twice a day, Pulmicort twice a day for his home schedule. Currently stable.  3. history of coronary disease: Stable history of CABG, cardiac catheter in March 2012, EF 65-70%.  Patient is on aspirin, beta blocker, statins and Plavix, currently on hold due to acute pancreatitis - On aspirin P.R, IV Lopressor. Plavix on hold for pending surgery. Will restart Plavix after the surgery when cleared by general surgery.   4. hypertension: Currently elevated place on IV Lopressor  5. history of atrial fibrillation paroxysmal:  was postop in 3/12, currently rate controlled. Not on any anticoagulation outpatient, on aspirin. Plavix on hold   6. hypokalemia replaced  7. DVT prophylaxis on Lovenox  LOS: 1 day   [name] 02/18/2011, 10:24 AM

## 2011-02-18 NOTE — Plan of Care (Signed)
Problem: Phase I Progression Outcomes Goal: OOB as tolerated unless otherwise ordered Outcome: Progressing Pt ambulates as ordered.

## 2011-02-19 LAB — COMPREHENSIVE METABOLIC PANEL
ALT: 39 U/L (ref 0–53)
Alkaline Phosphatase: 104 U/L (ref 39–117)
BUN: 15 mg/dL (ref 6–23)
CO2: 26 mEq/L (ref 19–32)
Calcium: 9.6 mg/dL (ref 8.4–10.5)
GFR calc Af Amer: >90 mL/min (ref >90)
GFR calc non Af Amer: 90 mL/min — ABNORMAL LOW (ref >90)
Glucose, Bld: 117 mg/dL — ABNORMAL HIGH (ref 70–99)
Potassium: 3 mEq/L — ABNORMAL LOW (ref 3.5–5.1)
Sodium: 136 mEq/L (ref 135–145)
Total Protein: 7.2 g/dL (ref 6.0–8.3)

## 2011-02-19 LAB — LIPASE, BLOOD: Lipase: 98 U/L — ABNORMAL HIGH (ref 11–59)

## 2011-02-19 MED ORDER — POTASSIUM CHLORIDE 10 MEQ/100ML IV SOLN
10.0000 meq | INTRAVENOUS | Status: AC
  Administered 2011-02-19 (×4): 10 meq via INTRAVENOUS
  Filled 2011-02-19 (×4): qty 100

## 2011-02-19 MED ORDER — LORAZEPAM 2 MG/ML IJ SOLN
1.0000 mg | Freq: Four times a day (QID) | INTRAMUSCULAR | Status: DC | PRN
  Administered 2011-02-19 – 2011-02-21 (×2): 1 mg via INTRAVENOUS
  Filled 2011-02-19: qty 1

## 2011-02-19 MED ORDER — LORAZEPAM 2 MG/ML IJ SOLN
INTRAMUSCULAR | Status: AC
  Administered 2011-02-19: 1 mg via INTRAVENOUS
  Filled 2011-02-19: qty 1

## 2011-02-19 MED ORDER — LEVALBUTEROL HCL 0.63 MG/3ML IN NEBU
0.6300 mg | INHALATION_SOLUTION | Freq: Two times a day (BID) | RESPIRATORY_TRACT | Status: DC
  Administered 2011-02-19 – 2011-02-23 (×9): 0.63 mg via RESPIRATORY_TRACT
  Filled 2011-02-19 (×11): qty 3

## 2011-02-19 MED ORDER — POTASSIUM CHLORIDE 2 MEQ/ML IV SOLN
INTRAVENOUS | Status: DC
  Filled 2011-02-19 (×2): qty 1000

## 2011-02-19 MED ORDER — POTASSIUM CHLORIDE 20 MEQ/15ML (10%) PO LIQD
40.0000 meq | Freq: Two times a day (BID) | ORAL | Status: AC
  Administered 2011-02-19 (×2): 40 meq via ORAL
  Filled 2011-02-19 (×2): qty 30

## 2011-02-19 NOTE — Progress Notes (Signed)
  Subjective: Not having any more abd pain.  Still has  heartburn  Objective: Vital signs in last 24 hours: Temp:  [97.9 F (36.6 C)-98.6 F (37 C)] 98.6 F (37 C) (11/11 0441) Pulse Rate:  [98-139] 115  (11/11 0441) Resp:  [18-21] 18  (11/11 0441) BP: (140-188)/(82-109) 150/83 mmHg (11/11 0441) SpO2:  [94 %-97 %] 94 % (11/11 0441) Last BM Date: 02/15/11  Intake/Output from previous day: 11/10 0701 - 11/11 0700 In: 1696.8 [I.V.:1596.8; IV Piggyback:100] Out: 950 [Urine:950] Intake/Output this shift:    PE: CV: increased rate ( 132 bpm on my exam) ABD- soft, nontender this am Lab Results:   [facility] 02/18/11 0420 02/17/11 0405  WBC 8.3 8.5  HGB 15.1 13.4  HCT 45.4 40.3  PLT 208 181   BMET  Basename 02/19/11 0420 02/18/11 0420  NA 136 136  K 3.0* 3.2*  CL 98 98  CO2 26 26  GLUCOSE 117* 110*  BUN 15 10  CREATININE 0.68 0.73  CALCIUM 9.6 9.7   LFTs normalized.  Lipase 98 and slowly trending down. PT/INR No results found for this basename: LABPROT:2,INR:2 in the last 72 hours ABG No results found for this basename: PHART:2,PCO2:2,PO2:2,HCO3:2 in the last 72 hours  Studies/Results: No results found.  Anti-infectives: Anti-infectives     Start     Dose/Rate Route Frequency Ordered Stop   02/15/11 2200   imipenem-cilastatin (PRIMAXIN) 500 mg in sodium chloride 0.9 % 100 mL IVPB        500 mg 200 mL/hr over 30 Minutes Intravenous 3 times per day 02/15/11 2011            Assessment Principal Problem:  *Pancreatitis, acute (gallstone)-asx now; lipase normalizing Active Problems:  HYPERTENSION  CORONARY HEART DISEASE  FIBRILLATION, ATRIAL-tachy this am GERd sxs  Hypokalemia    LOS: 4 days   Plan: Clear liquids,  EKG, oral KCl.  OR later this week when Plavix has worn off and he is medically stable.  Kimyah Frein J 02/19/2011

## 2011-02-19 NOTE — Progress Notes (Addendum)
Subjective: Denies CP or SOB; no palpitations   Physical Exam:   Blood pressure 150/83, pulse 115, temperature 98.6 F (37 C), temperature source Oral, resp. rate 18, height 5\' 2"  (1.575 m), weight 148 lb 4.8 oz (67.268 kg), SpO2 93.00%. Temp:  [97.9 F (36.6 C)-98.6 F (37 C)] 98.6 F (37 C) (11/11 0441) Pulse Rate:  [98-139] 115  (11/11 0441) Resp:  [18-21] 18  (11/11 0441) BP: (140-188)/(82-109) 150/83 mmHg (11/11 0441) SpO2:  [93 %-97 %] 93 % (11/11 0806) Weight change:  I/O 11/10 0701 - 11/11 0700 In: 1696.8 [I.V.:1596.8; IV Piggyback:100] Out: 950 [Urine:950]  HEENT-normal/normal eyelids Neck supple chest - CTA/ normal expansion CV - tachycardic/regular rhythm/normal S1 and S2; no murmurs, rubs or gallops Abdomen -No tenderness RUQ, no HSM, no mass, + bowel sounds, no bruit 2+ femoral pulses, no bruits Ext-no edema, chords, 2+ DP Neuro-grossly nonfocal  Results for orders placed during the hospital encounter of 02/15/11 (from the past 48 hour(s))  COMPREHENSIVE METABOLIC PANEL     Status: Abnormal   Collection Time   02/18/11  4:20 AM      Component Value Range Comment   Sodium 136  135 - 145 (mEq/L)    Potassium 3.2 (*) 3.5 - 5.1 (mEq/L)    Chloride 98  96 - 112 (mEq/L)    CO2 26  19 - 32 (mEq/L)    Glucose, Bld 110 (*) 70 - 99 (mg/dL)    BUN 10  6 - 23 (mg/dL)    Creatinine, Ser 1.61  0.50 - 1.35 (mg/dL)    Calcium 9.7  8.4 - 10.5 (mg/dL)    Total Protein 7.3  6.0 - 8.3 (g/dL)    Albumin 3.6  3.5 - 5.2 (g/dL)    AST 21  0 - 37 (U/L)    ALT 61 (*) 0 - 53 (U/L)    Alkaline Phosphatase 117  39 - 117 (U/L)    Total Bilirubin 0.7  0.3 - 1.2 (mg/dL)    GFR calc non Af Amer 87 (*) >90 (mL/min)    GFR calc Af Amer >90  >90 (mL/min)   CBC     Status: Normal   Collection Time   02/18/11  4:20 AM      Component Value Range Comment   WBC 8.3  4.0 - 10.5 (K/uL)    RBC 5.22  4.22 - 5.81 (MIL/uL)    Hemoglobin 15.1  13.0 - 17.0 (g/dL)    HCT 09.6  04.5 - 40.9 (%)      MCV 87.0  78.0 - 100.0 (fL)    MCH 28.9  26.0 - 34.0 (pg)    MCHC 33.3  30.0 - 36.0 (g/dL)    RDW 81.1  91.4 - 78.2 (%)    Platelets 208  150 - 400 (K/uL)   LIPASE, BLOOD     Status: Abnormal   Collection Time   02/18/11  4:20 AM      Component Value Range Comment   Lipase 119 (*) 11 - 59 (U/L)   COMPREHENSIVE METABOLIC PANEL     Status: Abnormal   Collection Time   02/19/11  4:20 AM      Component Value Range Comment   Sodium 136  135 - 145 (mEq/L)    Potassium 3.0 (*) 3.5 - 5.1 (mEq/L)    Chloride 98  96 - 112 (mEq/L)    CO2 26  19 - 32 (mEq/L)    Glucose, Bld 117 (*) 70 - 99 (  mg/dL)    BUN 15  6 - 23 (mg/dL)    Creatinine, Ser 1.47  0.50 - 1.35 (mg/dL)    Calcium 9.6  8.4 - 10.5 (mg/dL)    Total Protein 7.2  6.0 - 8.3 (g/dL)    Albumin 3.5  3.5 - 5.2 (g/dL)    AST 15  0 - 37 (U/L)    ALT 39  0 - 53 (U/L)    Alkaline Phosphatase 104  39 - 117 (U/L)    Total Bilirubin 0.7  0.3 - 1.2 (mg/dL)    GFR calc non Af Amer 90 (*) >90 (mL/min)    GFR calc Af Amer >90  >90 (mL/min)   LIPASE, BLOOD     Status: Abnormal   Collection Time   02/19/11  4:20 AM      Component Value Range Comment   Lipase 98 (*) 11 - 59 (U/L)     ECG - Sinus tachycardia  Assessment/Plan Patient Active Hospital Problem List: Pancreatitis, acute (02/16/2011) For cholecystectomy Monday HYPERTENSION (05/10/2007) Blood pressure mildly elevated; continue metoprolol 5 mg IV every four hours; resume PO meds postop Tachycardia - no symptoms; ECG shows sinus tach; may be secondary to ongoing abdominal issues along with inability to take po beta blocker; continue iv lopressor; no contraindication to surgery. CORONARY HEART DISEASE (05/10/2007) Continue ASA suppository while NPO. Resume plavix postop when ok with surgery. Hypokalemia - supplement.   Olga Millers MD 02/19/2011, 8:34 AM   Patient is being transferred to telemetry [facility]

## 2011-02-19 NOTE — Progress Notes (Signed)
Subjective:  S: No specific complaints but patient very anxious about the impending surgery which is driving his heart rate high.   Objective: Weight change:   Intake/Output Summary (Last 24 hours) at 02/19/11 1343 Last data filed at 02/19/11 0600  Gross per 24 hour  Intake 1696.82 ml  Output    850 ml  Net 846.82 ml   Blood pressure 130/87, pulse 119, temperature 98.3 F (36.8 C), temperature source Oral, resp. rate 16, height 5\' 2"  (1.575 m), weight 67.268 kg (148 lb 4.8 oz), SpO2 96.00%.  Physical Exam: General: Alert and awake, oriented x3, not in any acute distress. HEENT: anicteric sclera, pupils reactive to light and accommodation, EOMI CVS: S1-S2 clear, sinus tachycardia, no murmur rubs or gallops Chest: clear to auscultation bilaterally, no wheezing Abdomen: soft, no significant quadrant tenderness to deep palpation  Extremities: no cyanosis, clubbing or edema noted bilaterally Neuro: Cranial nerves II-XII intact, no focal neurological deficits  Lab Results:  Basename 02/18/11 0420 02/17/11 0405  WBC 8.3 8.5  HGB 15.1 13.4  HCT 45.4 40.3  PLT 208 181   BMET  Basename 02/19/11 0420 02/18/11 0420  NA 136 136  K 3.0* 3.2*  CL 98 98  CO2 26 26  GLUCOSE 117* 110*  BUN 15 10  CREATININE 0.68 0.73  CALCIUM 9.6 9.7    Micro Results: No results found for this or any previous visit (from the past 240 hour(s)).  Studies/Results: US Abdomen Complete  02/15/2011  *RADIOLOGY REPORT*  Clinical Data:  Abdominal pain, nausea, vomiting  COMPLETE ABDOMINAL ULTRASOUND  Comparison:  None.  Findings:  Gallbladder:  Multiple mobile gallstones are noted within gallbladder the largest measures 5 mm.  No thickening of the gallbladder wall.  No sonographic Murphy's sign.  Common bile duct:  Within normal limits measures 5 mm in diameter.  Liver:  Multiple hypoechoic cysts are noted within liver the largest in the left hepatic lobe measures 2.5 x 2.5 cm.  IVC:  Limited visualization  due to abundant bowel gas  Pancreas:  Not visualized due to abundant bowel gas  Spleen:  Measures 7 cm in length.  Normal echogenicity.  Right Kidney:  Measures 7 cm in length.  No hydronephrosis or diagnostic renal calculus  Left Kidney:  Measures 11.6 cm in length.  No hydronephrosis or diagnostic renal calculus  Abdominal aorta: Not well visualized due to abundant bowel gas.  IMPRESSION:  1.  Mobile gallstones are noted within gallbladder the largest measures 5 mm.  No thickening of gallbladder wall or sonographic Murphy's sign. 2.  Normal CBD. 3.  Hepatic cysts are noted. 4.  No hydronephrosis or diagnostic renal calculus.  Original Report Authenticated By: Natasha Mead, M.D.   Dg Abd Acute W/chest  02/15/2011  *RADIOLOGY REPORT*  Clinical Data: Midsternal pain down to naval, nausea and vomiting, diverticulosis  ACUTE ABDOMEN SERIES (ABDOMEN 2 VIEW & CHEST 1 VIEW)  Comparison: 03/02/2007; abdominal MRI - 11/19/2008; AP chest radiograph - 12/16/2010  Findings:  Grossly unchanged cardiac silhouette and mediastinal contours post median sternotomy and CABG.  No focal parenchymal opacities.  No pleural effusion or pneumothorax.  Nonobstructive bowel gas pattern.  Moderate colonic stool burden. No pneumoperitoneum, pneumatosis or portal venous gas.  No acute osseous abnormalities.  IMPRESSION: 1.  Moderate colonic stool burden without evidence of obstruction.  2.  No acute cardiopulmonary disease.  Original Report Authenticated By: Waynard Reeds, M.D.    Medications: Scheduled Meds:    . albuterol  2.5  mg Nebulization BID  . aspirin  300 mg Rectal Daily  . budesonide  0.25 mg Nebulization BID  . enalaprilat  0.625 mg Intravenous Q6H  . enoxaparin  40 mg Subcutaneous QHS  . imipenem-cilastatin  500 mg Intravenous Q8H  . ipratropium  0.5 mg Nebulization BID  . metoprolol  5 mg Intravenous Q4H  . pantoprazole (PROTONIX) IV  40 mg Intravenous QHS  . potassium chloride  40 mEq Oral BID   Continuous  Infusions:    . dextrose 5 % and 0.45% NaCl 50 mL/hr (02/19/11 0703)   PRN Meds:.albuterol, HYDROmorphone (DILAUDID) injection, LORazepam, metoprolol, ondansetron (ZOFRAN) IV  Assessment/Plan: 1. Acute pancreatitis: Likely secondary to gallstone pancreatitis, improving. - Continue n.p.o., IV fluids, Primaxin IV.  - General surgery following and recommended laparoscopic cholecystectomy in a.m.Marland Kitchen  - Cleared by cardiology for laparoscopic cholecystectomy  2. history of COPD/asthma: - Will change to Xopenex and Atrovent nebulizers twice a day, Pulmicort twice a day for his home schedule. Currently stable.  3. history of coronary disease: Stable history of CABG, cardiac catheter in March 2012, EF 65-70%.  Patient is on aspirin, beta blocker, statins and Plavix, currently on hold due to acute pancreatitis - On aspirin P.R, IV Lopressor. Plavix on hold for pending surgery. Will restart Plavix after the surgery when cleared by general surgery.  - Patient anxious about his impending surgery tomorrow, currently on IV schedule Lopressor per cardiology recommendations.  4. hypertension: Currently elevated, placed on IV Lopressor  5. history of atrial fibrillation paroxysmal:  was postop in 3/12, currently rate controlled. Not on any anticoagulation outpatient, on aspirin PR. Plavix on hold   6. hypokalemia replaced  7. Anxiety: add IV when necessary Ativan  8. DVT prophylaxis on Lovenox   RAI,RIPUDEEP 02/19/2011, 1:43 PM

## 2011-02-20 LAB — CBC
Hemoglobin: 15.9 g/dL (ref 13.0–17.0)
MCH: 29.4 pg (ref 26.0–34.0)
MCHC: 34.2 g/dL (ref 30.0–36.0)
Platelets: 266 10*3/uL (ref 150–400)
RDW: 13.7 % (ref 11.5–15.5)

## 2011-02-20 LAB — BASIC METABOLIC PANEL
BUN: 15 mg/dL (ref 6–23)
Chloride: 102 mEq/L (ref 96–112)
Creatinine, Ser: 0.86 mg/dL (ref 0.50–1.35)
GFR calc Af Amer: >90 mL/min (ref >90)
GFR calc non Af Amer: 82 mL/min — ABNORMAL LOW (ref >90)

## 2011-02-20 LAB — LIPASE, BLOOD: Lipase: 101 U/L — ABNORMAL HIGH (ref 11–59)

## 2011-02-20 LAB — GLUCOSE, CAPILLARY: Glucose-Capillary: 93 mg/dL (ref 70–99)

## 2011-02-20 NOTE — Progress Notes (Signed)
Subjective: Feels much better.   Off Plavix since last Wed.   Dr. Jens Som says he can have surgery, and restart plavix after procedure.  Objective: Vital signs in last 24 hours: Temp:  [97.4 F (36.3 C)-98.8 F (37.1 C)] 97.4 F (36.3 C) (11/12 0611) Pulse Rate:  [94-110] 108  (11/12 0611) Resp:  [16-28] 28  (11/12 0611) BP: (134-167)/(84-97) 134/86 mmHg (11/12 0611) SpO2:  [95 %-100 %] 97 % (11/12 0924) Last BM Date: 02/15/11  Intake/Output from previous day: 11/11 0701 - 11/12 0700 In: 710 [I.V.:500; IV Piggyback:210] Out: 900 [Urine:900] Intake/Output this shift:    General appearance: alert, cooperative and no distress Resp: clear to auscultation bilaterally GI: soft, non-tender; bowel sounds normal; no masses,  no organomegaly  Lab Results:   [facility] 02/20/11 0449 02/18/11 0420  WBC 6.6 8.3  HGB 15.9 15.1  HCT 46.5 45.4  PLT 266 208    BMET  Basename 02/20/11 0449 02/19/11 0420  NA 140 136  K 3.7 3.0*  CL 102 98  CO2 27 26  GLUCOSE 105* 117*  BUN 15 15  CREATININE 0.86 0.68  CALCIUM 9.4 9.6   PT/INR No results found for this basename: LABPROT:2,INR:2 in the last 72 hours Lipase: 101   Studies/Results: No results found.  Anti-infectives: Anti-infectives     Start     Dose/Rate Route Frequency Ordered Stop   02/15/11 2200   imipenem-cilastatin (PRIMAXIN) 500 mg in sodium chloride 0.9 % 100 mL IVPB        500 mg 200 mL/hr over 30 Minutes Intravenous 3 times per day 02/15/11 2011           Current Facility-Administered Medications  Medication Dose Route Frequency Provider Last Rate Last Dose  . aspirin suppository 300 mg  300 mg Rectal Daily Darrol Jump, PA   300 mg at 02/20/11 1114  . budesonide (PULMICORT) nebulizer solution 0.25 mg  0.25 mg Nebulization BID Ripudeep Rai   0.25 mg at 02/20/11 0926  . dextrose 5 %-0.45 % sodium chloride infusion   Intravenous Continuous Charles Nwaokocha 50 mL/hr at 02/19/11 0703 50 mL/hr at  02/19/11 0703  . enalaprilat (VASOTEC) injection 0.625 mg  0.625 mg Intravenous Q6H Charles Nwaokocha   0.625 mg at 02/20/11 0604  . enoxaparin (LOVENOX) injection 40 mg  40 mg Subcutaneous QHS Charles Nwaokocha   40 mg at 02/19/11 2207  . HYDROmorphone (DILAUDID) injection 1 mg  1 mg Intravenous Q4H PRN Ripudeep Rai   1 mg at 02/16/11 2210  . imipenem-cilastatin (PRIMAXIN) 500 mg in sodium chloride 0.9 % 100 mL IVPB  500 mg Intravenous Q8H Charles Nwaokocha   500 mg at 02/20/11 1610  . ipratropium (ATROVENT) nebulizer solution 0.5 mg  0.5 mg Nebulization BID Ripudeep Rai   0.5 mg at 02/20/11 0924  . levalbuterol (XOPENEX) nebulizer solution 0.63 mg  0.63 mg Nebulization BID Ripudeep Rai   0.63 mg at 02/20/11 0925  . LORazepam (ATIVAN) injection 1 mg  1 mg Intravenous Q6H PRN Ripudeep Rai   1 mg at 02/19/11 1247  . metoprolol (LOPRESSOR) injection 2.5 mg  2.5 mg Intravenous Q2H PRN Ripudeep Rai   2.5 mg at 02/18/11 1539  . metoprolol (LOPRESSOR) injection 5 mg  5 mg Intravenous Q4H Lewayne Bunting, MD   5 mg at 02/20/11 1114  . ondansetron (ZOFRAN) injection 4 mg  4 mg Intravenous Q6H PRN Ripudeep Rai   4 mg at 02/17/11 1544  . pantoprazole (PROTONIX) injection 40  mg  40 mg Intravenous QHS Charles Nwaokocha   40 mg at 02/19/11 2205  . potassium chloride 10 mEq in 100 mL IVPB  10 mEq Intravenous Q1 Hr x 4 Ripudeep Rai   10 mEq at 02/19/11 1747  . potassium chloride 20 MEQ/15ML (10%) liquid 40 mEq  40 mEq Oral BID Adolph Pollack, MD   40 mEq at 02/19/11 2203  . DISCONTD: albuterol (PROVENTIL) (5 MG/ML) 0.5% nebulizer solution 2.5 mg  2.5 mg Nebulization Q4H PRN Charles Nwaokocha   2.5 mg at 02/16/11 1140  . DISCONTD: albuterol (PROVENTIL) (5 MG/ML) 0.5% nebulizer solution 2.5 mg  2.5 mg Nebulization BID Ripudeep Rai   2.5 mg at 02/18/11 2008  . DISCONTD: dextrose 5 % and 0.45% NaCl 1,000 mL with potassium chloride 40 mEq infusion   Intravenous Continuous Ripudeep Rai        Assessment/Plan  1.  Pancreatitis, with cholelithiasis. 2.  Patient Active Problem List  Diagnoses  . DYSLIPIDEMIA  . HYPERTENSION  . CORONARY HEART DISEASE  . FIBRILLATION, ATRIAL  . HEMORRHOIDS-INTERNAL  . ALLERGIC RHINITIS  . ASTHMA  . ESOPHAGEAL STRICTURE  . GERD  . HEPATIC CYST  . Obstructive sleep apnea  . PROSTATITIS, HX OF  . TOBACCO USE, QUIT  . HYPERSOMNIA  . Hematoma  . Hypokalemia  . Diarrhea  . Viral syndrome  . Pancreatitis, acute  Plan; recheck labs, Dr. Daphine Deutscher will see/ Probable surgery tomorrow .  LOS: 5 days    [name],WILLARD 02/20/2011

## 2011-02-20 NOTE — Progress Notes (Signed)
Subjective: Denies CP or SOB; no palpitations   Physical Exam:   Blood pressure 134/86, pulse 108, temperature 97.4 F (36.3 C), temperature source Oral, resp. rate 28, height 5\' 2"  (1.575 m), weight 148 lb 4.8 oz (67.268 kg), SpO2 97.00%. Temp:  [97.4 F (36.3 C)-98.8 F (37.1 C)] 97.4 F (36.3 C) (11/12 0611) Pulse Rate:  [76-119] 108  (11/12 0611) Resp:  [16-28] 28  (11/12 0611) BP: (130-167)/(76-97) 134/86 mmHg (11/12 0611) SpO2:  [93 %-100 %] 97 % (11/12 0611) Weight change:  I/O 11/11 0701 - 11/12 0700 In: 710 [I.V.:500; IV Piggyback:210] Out: 900 [Urine:900]  HEENT-normal/normal eyelids Neck supple chest - CTA/ normal expansion CV - tachycardic/regular rhythm/normal S1 and S2; no murmurs, rubs or gallops Abdomen -No tenderness RUQ, no HSM, no mass, + bowel sounds, no bruit 2+ femoral pulses, no bruits Ext-no edema, chords, 2+ DP Neuro-grossly nonfocal  Results for orders placed during the hospital encounter of 02/15/11 (from the past 48 hour(s))  COMPREHENSIVE METABOLIC PANEL     Status: Abnormal   Collection Time   02/19/11  4:20 AM      Component Value Range Comment   Sodium 136  135 - 145 (mEq/L)    Potassium 3.0 (*) 3.5 - 5.1 (mEq/L)    Chloride 98  96 - 112 (mEq/L)    CO2 26  19 - 32 (mEq/L)    Glucose, Bld 117 (*) 70 - 99 (mg/dL)    BUN 15  6 - 23 (mg/dL)    Creatinine, Ser 1.61  0.50 - 1.35 (mg/dL)    Calcium 9.6  8.4 - 10.5 (mg/dL)    Total Protein 7.2  6.0 - 8.3 (g/dL)    Albumin 3.5  3.5 - 5.2 (g/dL)    AST 15  0 - 37 (U/L)    ALT 39  0 - 53 (U/L)    Alkaline Phosphatase 104  39 - 117 (U/L)    Total Bilirubin 0.7  0.3 - 1.2 (mg/dL)    GFR calc non Af Amer 90 (*) >90 (mL/min)    GFR calc Af Amer >90  >90 (mL/min)   LIPASE, BLOOD     Status: Abnormal   Collection Time   02/19/11  4:20 AM      Component Value Range Comment   Lipase 98 (*) 11 - 59 (U/L)   BASIC METABOLIC PANEL     Status: Abnormal   Collection Time   02/20/11  4:49 AM   Component Value Range Comment   Sodium 140  135 - 145 (mEq/L)    Potassium 3.7  3.5 - 5.1 (mEq/L)    Chloride 102  96 - 112 (mEq/L)    CO2 27  19 - 32 (mEq/L)    Glucose, Bld 105 (*) 70 - 99 (mg/dL)    BUN 15  6 - 23 (mg/dL)    Creatinine, Ser 0.96  0.50 - 1.35 (mg/dL)    Calcium 9.4  8.4 - 10.5 (mg/dL)    GFR calc non Af Amer 82 (*) >90 (mL/min)    GFR calc Af Amer >90  >90 (mL/min)   LIPASE, BLOOD     Status: Abnormal   Collection Time   02/20/11  4:49 AM      Component Value Range Comment   Lipase 101 (*) 11 - 59 (U/L)     Assessment/Plan Patient Active Hospital Problem List: Pancreatitis, acute (02/16/2011) For cholecystectomy today HYPERTENSION (05/10/2007) Blood pressure mildly elevated; continue metoprolol 5 mg IV every four  hours; resume PO meds postop Tachycardia - no symptoms; ECG yesterday showed sinus tach; Transient elevation of HR yesterday with no symptoms; review of telemetry shows sinus tach; may be secondary to ongoing abdominal issues; continue iv lopressor; Will check Hgb and TSH in AM; no contraindication to surgery. CORONARY HEART DISEASE (05/10/2007) Continue ASA suppository while NPO. Resume plavix postop when ok with surgery. Hypokalemia - improved.   Olga Millers MD 02/20/2011, 7:28 AM   Patient is being transferred to telemetry [facility]

## 2011-02-20 NOTE — Progress Notes (Signed)
Subjective:  S: No specific complaints awaiting surgery Objective: Weight change:   Intake/Output Summary (Last 24 hours) at 02/20/11 1018 Last data filed at 02/20/11 1009  Gross per 24 hour  Intake    710 ml  Output    900 ml  Net   -190 ml   Blood pressure 134/86, pulse 108, temperature 97.4 F (36.3 C), temperature source Oral, resp. rate 28, height 5\' 2"  (1.575 m), weight 67.268 kg (148 lb 4.8 oz), SpO2 97.00%.  Physical Exam: General: Alert and awake, oriented x3, not in any acute distress. HEENT: anicteric sclera, pupils reactive to light and accommodation, EOMI CVS: S1-S2 clear, sinus tachycardia, no murmur rubs or gallops Chest: clear to auscultation bilaterally, no wheezing Abdomen: soft, no significant abdominal tenderness to deep palpation  Extremities: no cyanosis, clubbing or edema noted bilaterally Neuro: Cranial nerves II-XII intact, no focal neurological deficits  Lab Results:  Basename 02/20/11 0449 02/18/11 0420  WBC 6.6 8.3  HGB 15.9 15.1  HCT 46.5 45.4  PLT 266 208   BMET  Basename 02/20/11 0449 02/19/11 0420  NA 140 136  K 3.7 3.0*  CL 102 98  CO2 27 26  GLUCOSE 105* 117*  BUN 15 15  CREATININE 0.86 0.68  CALCIUM 9.4 9.6    Micro Results: No results found for this or any previous visit (from the past 240 hour(s)).  Studies/Results: US Abdomen Complete  02/15/2011  *RADIOLOGY REPORT*  Clinical Data:  Abdominal pain, nausea, vomiting  COMPLETE ABDOMINAL ULTRASOUND  Comparison:  None.  Findings:  Gallbladder:  Multiple mobile gallstones are noted within gallbladder the largest measures 5 mm.  No thickening of the gallbladder wall.  No sonographic Murphy's sign.  Common bile duct:  Within normal limits measures 5 mm in diameter.  Liver:  Multiple hypoechoic cysts are noted within liver the largest in the left hepatic lobe measures 2.5 x 2.5 cm.  IVC:  Limited visualization due to abundant bowel gas  Pancreas:  Not visualized due to abundant bowel gas   Spleen:  Measures 7 cm in length.  Normal echogenicity.  Right Kidney:  Measures 7 cm in length.  No hydronephrosis or diagnostic renal calculus  Left Kidney:  Measures 11.6 cm in length.  No hydronephrosis or diagnostic renal calculus  Abdominal aorta: Not well visualized due to abundant bowel gas.  IMPRESSION:  1.  Mobile gallstones are noted within gallbladder the largest measures 5 mm.  No thickening of gallbladder wall or sonographic Murphy's sign. 2.  Normal CBD. 3.  Hepatic cysts are noted. 4.  No hydronephrosis or diagnostic renal calculus.  Original Report Authenticated By: Natasha Mead, M.D.   Dg Abd Acute W/chest  02/15/2011  *RADIOLOGY REPORT*  Clinical Data: Midsternal pain down to naval, nausea and vomiting, diverticulosis  ACUTE ABDOMEN SERIES (ABDOMEN 2 VIEW & CHEST 1 VIEW)  Comparison: 03/02/2007; abdominal MRI - 11/19/2008; AP chest radiograph - 12/16/2010  Findings:  Grossly unchanged cardiac silhouette and mediastinal contours post median sternotomy and CABG.  No focal parenchymal opacities.  No pleural effusion or pneumothorax.  Nonobstructive bowel gas pattern.  Moderate colonic stool burden. No pneumoperitoneum, pneumatosis or portal venous gas.  No acute osseous abnormalities.  IMPRESSION: 1.  Moderate colonic stool burden without evidence of obstruction.  2.  No acute cardiopulmonary disease.  Original Report Authenticated By: Waynard Reeds, M.D.    Medications: Scheduled Meds:    . aspirin  300 mg Rectal Daily  . budesonide  0.25 mg Nebulization  BID  . enalaprilat  0.625 mg Intravenous Q6H  . enoxaparin  40 mg Subcutaneous QHS  . imipenem-cilastatin  500 mg Intravenous Q8H  . ipratropium  0.5 mg Nebulization BID  . levalbuterol  0.63 mg Nebulization BID  . metoprolol  5 mg Intravenous Q4H  . pantoprazole (PROTONIX) IV  40 mg Intravenous QHS  . potassium chloride  10 mEq Intravenous Q1 Hr x 4  . potassium chloride  40 mEq Oral BID  . DISCONTD: albuterol  2.5 mg  Nebulization BID   Continuous Infusions:    . dextrose 5 % and 0.45% NaCl 50 mL/hr (02/19/11 0703)  . DISCONTD: dextrose 5 % and 0.45% NaCl 1,000 mL with potassium chloride 40 mEq infusion     PRN Meds:.HYDROmorphone (DILAUDID) injection, LORazepam, metoprolol, ondansetron (ZOFRAN) IV, DISCONTD: albuterol  Assessment/Plan: 1. Acute pancreatitis: Likely secondary to gallstone pancreatitis, improving. - Continue n.p.o., IV fluids, Primaxin IV, General surgery following, laparoscopic cholecystectomy today, awaiting confirmation  - Cleared by cardiology for surgery  2. history of COPD/asthma: - Will change to Xopenex and Atrovent nebulizers twice a day, Pulmicort twice a day for his home schedule. Currently stable.  3. history of coronary disease: Stable history of CABG, cardiac catheter in March 2012, EF 65-70%.  Patient is on aspirin, beta blocker, statins and Plavix, currently on hold due to acute pancreatitis - On aspirin P.R, IV Lopressor. Plavix on hold for pending surgery. Will restart Plavix after the surgery when cleared by general surgery.  - Patient anxious about his impending surgery tomorrow, currently on IV schedule Lopressor per cardiology recommendations.  4. hypertension: Currently elevated, on IV Lopressor  5. history of atrial fibrillation paroxysmal:  Not on any anticoagulation outpatient, on aspirin PR. Plavix on hold for surgery  6. Hypokalemia: Resolved  7. Anxiety: on IV when necessary Ativan  8. DVT prophylaxis on Lovenox  wife at bedside updated in detail   Stephanne Greeley 02/20/2011, 10:18 AM

## 2011-02-21 ENCOUNTER — Other Ambulatory Visit (INDEPENDENT_AMBULATORY_CARE_PROVIDER_SITE_OTHER): Payer: Self-pay | Admitting: Surgery

## 2011-02-21 ENCOUNTER — Encounter (HOSPITAL_COMMUNITY)
Admission: EM | Disposition: A | Discharge status: Home / Self Care | Payer: Self-pay | Source: Home / Self Care | Attending: Internal Medicine

## 2011-02-21 ENCOUNTER — Encounter (HOSPITAL_COMMUNITY): Payer: Self-pay | Admitting: Anesthesiology

## 2011-02-21 ENCOUNTER — Inpatient Hospital Stay (HOSPITAL_COMMUNITY): Payer: Medicare Other | Admitting: Anesthesiology

## 2011-02-21 HISTORY — PX: CHOLECYSTECTOMY: SHX55

## 2011-02-21 LAB — TSH: TSH: 1.212 u[IU]/mL (ref 0.350–4.500)

## 2011-02-21 LAB — SURGICAL PCR SCREEN: MRSA, PCR: NEGATIVE

## 2011-02-21 SURGERY — LAPAROSCOPIC CHOLECYSTECTOMY WITH INTRAOPERATIVE CHOLANGIOGRAM
Anesthesia: General

## 2011-02-21 SURGERY — LAPAROSCOPIC CHOLECYSTECTOMY WITH INTRAOPERATIVE CHOLANGIOGRAM
Anesthesia: General | Site: Abdomen | Wound class: Clean Contaminated

## 2011-02-21 MED ORDER — PROMETHAZINE HCL 25 MG/ML IJ SOLN: 6.2500 mg | INTRAMUSCULAR | Status: DC | PRN

## 2011-02-21 MED ORDER — FENTANYL CITRATE 0.05 MG/ML IJ SOLN
INTRAMUSCULAR | Status: DC | PRN
  Administered 2011-02-21: 100 ug via INTRAVENOUS
  Administered 2011-02-21 (×4): 50 ug via INTRAVENOUS

## 2011-02-21 MED ORDER — ROCURONIUM BROMIDE 100 MG/10ML IV SOLN
INTRAVENOUS | Status: DC | PRN
  Administered 2011-02-21: 40 mg via INTRAVENOUS

## 2011-02-21 MED ORDER — METOPROLOL TARTRATE 1 MG/ML IV SOLN
10.0000 mg | INTRAVENOUS | Status: DC
  Administered 2011-02-21 – 2011-02-22 (×4): 10 mg via INTRAVENOUS
  Filled 2011-02-21 (×7): qty 10

## 2011-02-21 MED ORDER — ONDANSETRON HCL 4 MG/2ML IJ SOLN
INTRAMUSCULAR | Status: DC | PRN
  Administered 2011-02-21: 4 mg via INTRAVENOUS

## 2011-02-21 MED ORDER — LACTATED RINGERS IV SOLN
INTRAVENOUS | Status: DC
  Administered 2011-02-21: 1000 mL via INTRAVENOUS

## 2011-02-21 MED ORDER — LACTATED RINGERS IV SOLN
INTRAVENOUS | Status: AC
  Administered 2011-02-21 – 2011-02-22 (×2): via INTRAVENOUS

## 2011-02-21 MED ORDER — LABETALOL HCL 5 MG/ML IV SOLN
2.5000 mg | INTRAVENOUS | Status: DC | PRN
  Administered 2011-02-21 (×3): 2.5 mg via INTRAVENOUS
  Filled 2011-02-21: qty 4

## 2011-02-21 MED ORDER — NEOSTIGMINE METHYLSULFATE 1 MG/ML IJ SOLN
INTRAMUSCULAR | Status: DC | PRN
  Administered 2011-02-21: 4 mg via INTRAVENOUS

## 2011-02-21 MED ORDER — FENTANYL CITRATE 0.05 MG/ML IJ SOLN: 25.0000 ug | INTRAMUSCULAR | Status: DC | PRN

## 2011-02-21 MED ORDER — BUPIVACAINE LIPOSOME 1.3 % IJ SUSP
20.0000 mL | Freq: Once | INTRAMUSCULAR | Status: AC
  Administered 2011-02-21: 17 mL
  Filled 2011-02-21: qty 20

## 2011-02-21 MED ORDER — PHENYLEPHRINE HCL 10 MG/ML IJ SOLN
INTRAMUSCULAR | Status: DC | PRN
  Administered 2011-02-21 (×2): 40 ug via INTRAVENOUS

## 2011-02-21 MED ORDER — IOHEXOL 300 MG/ML  SOLN
INTRAMUSCULAR | Status: DC | PRN
  Administered 2011-02-21: 6 mL

## 2011-02-21 MED ORDER — LORAZEPAM 2 MG/ML IJ SOLN
INTRAMUSCULAR | Status: AC
  Filled 2011-02-21: qty 1

## 2011-02-21 MED ORDER — PROPOFOL 10 MG/ML IV EMUL
INTRAVENOUS | Status: DC | PRN
  Administered 2011-02-21: 120 mg via INTRAVENOUS

## 2011-02-21 MED ORDER — AMLODIPINE BESYLATE 10 MG PO TABS
10.0000 mg | ORAL_TABLET | Freq: Every day | ORAL | Status: DC
  Administered 2011-02-21 – 2011-02-23 (×3): 10 mg via ORAL
  Filled 2011-02-21 (×4): qty 1

## 2011-02-21 MED ORDER — LACTATED RINGERS IR SOLN
Status: DC | PRN
  Administered 2011-02-21: 1000 mL

## 2011-02-21 MED ORDER — GLYCOPYRROLATE 0.2 MG/ML IJ SOLN
INTRAMUSCULAR | Status: DC | PRN
  Administered 2011-02-21: .6 mg via INTRAVENOUS

## 2011-02-21 MED ORDER — LIDOCAINE HCL (CARDIAC) 20 MG/ML IV SOLN
INTRAVENOUS | Status: DC | PRN
  Administered 2011-02-21: 50 mg via INTRAVENOUS

## 2011-02-21 MED ORDER — LACTATED RINGERS IV SOLN
INTRAVENOUS | Status: DC | PRN
  Administered 2011-02-21 (×3): via INTRAVENOUS

## 2011-02-21 MED ORDER — CLONIDINE HCL 0.2 MG PO TABS
0.2000 mg | ORAL_TABLET | Freq: Two times a day (BID) | ORAL | Status: DC
  Administered 2011-02-21: 0.2 mg via ORAL
  Filled 2011-02-21 (×3): qty 1

## 2011-02-21 MED ORDER — METOPROLOL TARTRATE 1 MG/ML IV SOLN
INTRAVENOUS | Status: DC | PRN
  Administered 2011-02-21 (×3): 2.5 mg via INTRAVENOUS
  Administered 2011-02-21: 5 mg via INTRAVENOUS

## 2011-02-21 MED ORDER — HEPARIN SODIUM (PORCINE) 5000 UNIT/ML IJ SOLN
5000.0000 [IU] | Freq: Three times a day (TID) | INTRAMUSCULAR | Status: DC
  Administered 2011-02-21 – 2011-02-23 (×5): 5000 [IU] via SUBCUTANEOUS
  Filled 2011-02-21 (×10): qty 1

## 2011-02-21 MED ORDER — LORAZEPAM 2 MG/ML IJ SOLN
1.0000 mg | Freq: Four times a day (QID) | INTRAMUSCULAR | Status: DC | PRN
  Administered 2011-02-21: 1 mg via INTRAVENOUS

## 2011-02-21 MED ORDER — SODIUM CHLORIDE 0.9 % IR SOLN
Status: DC | PRN
  Administered 2011-02-21: 1000 mL

## 2011-02-21 MED ORDER — ESMOLOL HCL 10 MG/ML IV SOLN
INTRAVENOUS | Status: DC | PRN
  Administered 2011-02-21: 30 mg via INTRAVENOUS

## 2011-02-21 SURGICAL SUPPLY — 47 items
APL SKNCLS STERI-STRIP NONHPOA (GAUZE/BANDAGES/DRESSINGS) ×1
APPLIER CLIP 5 13 M/L LIGAMAX5 (MISCELLANEOUS) ×2
APPLIER CLIP ROT 10 11.4 M/L (STAPLE)
APR CLP MED LRG 11.4X10 (STAPLE)
APR CLP MED LRG 5 ANG JAW (MISCELLANEOUS) ×1
BAG SPEC RTRVL LRG 6X4 10 (ENDOMECHANICALS) ×1
BENZOIN TINCTURE PRP APPL 2/3 (GAUZE/BANDAGES/DRESSINGS) ×2 IMPLANT
CABLE HIGH FREQUENCY MONO STRZ (ELECTRODE) ×1 IMPLANT
CANISTER SUCTION 2500CC (MISCELLANEOUS) ×2 IMPLANT
CATH REDDICK CHOLANGI 4FR 50CM (CATHETERS) ×1 IMPLANT
CLIP APPLIE 5 13 M/L LIGAMAX5 (MISCELLANEOUS) IMPLANT
CLIP APPLIE ROT 10 11.4 M/L (STAPLE) IMPLANT
CLOTH BEACON ORANGE TIMEOUT ST (SAFETY) ×2 IMPLANT
COVER MAYO STAND STRL (DRAPES) ×2 IMPLANT
COVER SURGICAL LIGHT HANDLE (MISCELLANEOUS) ×2 IMPLANT
DECANTER SPIKE VIAL GLASS SM (MISCELLANEOUS) ×1 IMPLANT
DRAPE C-ARM 42X72 X-RAY (DRAPES) ×2 IMPLANT
DRAPE LAPAROSCOPIC ABDOMINAL (DRAPES) ×2 IMPLANT
ELECT REM PT RETURN 9FT ADLT (ELECTROSURGICAL) ×2
ELECTRODE REM PT RTRN 9FT ADLT (ELECTROSURGICAL) ×1 IMPLANT
GLOVE BIOGEL M 8.0 STRL (GLOVE) ×2 IMPLANT
GOWN STRL NON-REIN LRG LVL3 (GOWN DISPOSABLE) ×3 IMPLANT
GOWN STRL REIN XL XLG (GOWN DISPOSABLE) ×4 IMPLANT
HEMOSTAT SURGICEL 4X8 (HEMOSTASIS) IMPLANT
IV CATH 14GX2 1/4 (CATHETERS) ×2 IMPLANT
KIT BASIN OR (CUSTOM PROCEDURE TRAY) ×2 IMPLANT
NS IRRIG 1000ML POUR BTL (IV SOLUTION) ×2 IMPLANT
POUCH SPECIMEN RETRIEVAL 10MM (ENDOMECHANICALS) ×2 IMPLANT
SCISSORS LAP 5X35 DISP (ENDOMECHANICALS) ×1 IMPLANT
SET CHOLANGIOGRAPH MIX (MISCELLANEOUS) IMPLANT
SET IRRIG TUBING LAPAROSCOPIC (IRRIGATION / IRRIGATOR) ×2 IMPLANT
SLEEVE Z-THREAD 5X100MM (TROCAR) ×1 IMPLANT
SOLUTION ANTI FOG 6CC (MISCELLANEOUS) ×2 IMPLANT
STRIP CLOSURE SKIN 1/2X4 (GAUZE/BANDAGES/DRESSINGS) ×2 IMPLANT
SUT VIC AB 4-0 SH 18 (SUTURE) ×2 IMPLANT
SYR 20CC LL (SYRINGE) ×1 IMPLANT
SYR 30ML LL (SYRINGE) ×2 IMPLANT
SYS RETRIEVAL 5MM INZII UNIV (BASKET) ×2
SYSTEM RETRIEVL 5MM INZII UNIV (BASKET) IMPLANT
TOWEL OR 17X26 10 PK STRL BLUE (TOWEL DISPOSABLE) ×4 IMPLANT
TRAY LAP CHOLE (CUSTOM PROCEDURE TRAY) ×2 IMPLANT
TROCAR BLADELESS OPT 5 75 (ENDOMECHANICALS) ×1 IMPLANT
TROCAR XCEL BLUNT TIP 100MML (ENDOMECHANICALS) IMPLANT
TROCAR XCEL NON-BLD 11X100MML (ENDOMECHANICALS) IMPLANT
TROCAR Z-THREAD FIOS 11X100 BL (TROCAR) ×1 IMPLANT
TROCAR Z-THREAD FIOS 5X100MM (TROCAR) ×1 IMPLANT
TUBING INSUFFLATION 10FT LAP (TUBING) ×2 IMPLANT

## 2011-02-21 NOTE — Op Note (Signed)
Procedure laparoscopic cholecystectomy with intraoperative cholangiogram Surgeon Daphine Deutscher Asst. [facility] due to a general endotracheal Description of procedure: [name]  is a 77 year old white male who was admitted earlier on November 7 with biliary pancreatitis. As been on Plavix and that has been stopped for several days. He is brought to the OR at this time for cholecystectomy. Informed consent was obtained. Patient was taken to room #1 and given general anesthesia. The abdomen was prepped with PCMX and draped sterilely.  Access was achieved the right upper quadrant using a 5 mm 0 view without difficulty. Total of 4    5 mm trochars were used. The gallbladder was grasped elevated. Cannulas try and was dissected as was artery which was around the cystic duct. I did then a McClain drill inserting a Reddick catheter showed good intrahepatic filling and free flow in duodenum. No stones were noted. Cystic duct was then triple clipped divided and the artery had guarded previously been clipped the gallbladder was removed the gallbladder bed without difficulty. Doppler was placed and a 5 mm bag and brought out through the upper midline trocar site.  The wounds were all injected with Exparel and closed 4-0 Vicryl with Dermabond. Patient is a recovering in satisfactory condition.

## 2011-02-21 NOTE — Progress Notes (Signed)
Subjective: Denies CP or SOB; no palpitations   Physical Exam:   Blood pressure 182/97, pulse 118, temperature 98.4 F (36.9 C), temperature source Oral, resp. rate 20, height 5\' 2"  (1.575 m), weight 148 lb 4.8 oz (67.268 kg), SpO2 97.00%. Temp:  [98.3 F (36.8 C)-98.8 F (37.1 C)] 98.4 F (36.9 C) (11/13 0527) Pulse Rate:  [73-120] 118  (11/13 0527) Resp:  [16-20] 20  (11/13 0527) BP: (141-182)/(67-97) 182/97 mmHg (11/13 0527) SpO2:  [97 %-98 %] 97 % (11/13 0527) Weight change:  I/O 11/12 0701 - 11/13 0700 In: 1927.5 [P.O.:850; I.V.:777.5; IV Piggyback:300] Out: 1525 [Urine:1525]  HEENT-normal/normal eyelids Neck supple chest - CTA/ normal expansion CV - tachycardic/regular rhythm/normal S1 and S2; no murmurs, rubs or gallops Abdomen -No tenderness RUQ, no HSM, no mass, + bowel sounds, no bruit 2+ femoral pulses, no bruits Ext-no edema, chords, 2+ DP Neuro-grossly nonfocal  Results for orders placed during the hospital encounter of 02/15/11 (from the past 48 hour(s))  BASIC METABOLIC PANEL     Status: Abnormal   Collection Time   02/20/11  4:49 AM      Component Value Range Comment   Sodium 140  135 - 145 (mEq/L)    Potassium 3.7  3.5 - 5.1 (mEq/L)    Chloride 102  96 - 112 (mEq/L)    CO2 27  19 - 32 (mEq/L)    Glucose, Bld 105 (*) 70 - 99 (mg/dL)    BUN 15  6 - 23 (mg/dL)    Creatinine, Ser 4.09  0.50 - 1.35 (mg/dL)    Calcium 9.4  8.4 - 10.5 (mg/dL)    GFR calc non Af Amer 82 (*) >90 (mL/min)    GFR calc Af Amer >90  >90 (mL/min)   LIPASE, BLOOD     Status: Abnormal   Collection Time   02/20/11  4:49 AM      Component Value Range Comment   Lipase 101 (*) 11 - 59 (U/L)   CBC     Status: Normal   Collection Time   02/20/11  4:49 AM      Component Value Range Comment   WBC 6.6  4.0 - 10.5 (K/uL)    RBC 5.40  4.22 - 5.81 (MIL/uL)    Hemoglobin 15.9  13.0 - 17.0 (g/dL)    HCT 81.1  91.4 - 78.2 (%)    MCV 86.1  78.0 - 100.0 (fL)    MCH 29.4  26.0 - 34.0 (pg)      MCHC 34.2  30.0 - 36.0 (g/dL)    RDW 95.6  21.3 - 08.6 (%)    Platelets 266  150 - 400 (K/uL)   GLUCOSE, CAPILLARY     Status: Normal   Collection Time   02/20/11  9:42 PM      Component Value Range Comment   Glucose-Capillary 93  70 - 99 (mg/dL)    Comment 1 Notify RN       Assessment/Plan Patient Active Hospital Problem List: Pancreatitis, acute (02/16/2011) For cholecystectomy today HYPERTENSION (05/10/2007) Blood pressure mildly elevated; Increase metoprolol to 10 mg IV every four hours; resume PO meds postop Tachycardia - no symptoms; Previous ECG showed sinus tach; Transient elevation of HR with activities with no symptoms; review of telemetry shows sinus tach; may be secondary to ongoing abdominal issues and lower dose of iv beta blocker compared to po; continue iv lopressor; Await Hgb and TSH; no contraindication to surgery. CORONARY HEART DISEASE (05/10/2007) Continue ASA suppository  while NPO. Resume plavix postop when ok with surgery.    Olga Millers MD 02/21/2011, 7:09 AM   Patient is being transferred to telemetry [facility]

## 2011-02-21 NOTE — Anesthesia Preprocedure Evaluation (Signed)
Anesthesia Evaluation  Patient identified by MRN, date of birth, ID band Patient awake, Patient confused and Patient unresponsive    Reviewed: Allergy & Precautions, H&P , NPO status , Patient's Chart, lab work & pertinent test results, reviewed documented beta blocker date and time   Airway Mallampati: II      Dental  (+) Upper Dentures and Lower Dentures   Pulmonary asthma , sleep apnea ,  clear to auscultation        Cardiovascular hypertension, + CAD and + CABG + dysrhythmias regular Normal+ Systolic murmurs    Neuro/Psych    GI/Hepatic hiatal hernia, GERD-  ,  Endo/Other    Renal/GU      Musculoskeletal   Abdominal   Peds  Hematology   Anesthesia Other Findings   Reproductive/Obstetrics                           Anesthesia Physical Anesthesia Plan  ASA: III  Anesthesia Plan: General   Post-op Pain Management:    Induction:   Airway Management Planned:   Additional Equipment:   Intra-op Plan:   Post-operative Plan:   Informed Consent: I have reviewed the patients History and Physical, chart, labs and discussed the procedure including the risks, benefits and alternatives for the proposed anesthesia with the patient or authorized representative who has indicated his/her understanding and acceptance.   Dental Advisory Given  Plan Discussed with: Anesthesiologist  Anesthesia Plan Comments:         Anesthesia Quick Evaluation

## 2011-02-21 NOTE — Progress Notes (Signed)
Subjective:  S: No specific complaints awaiting surgery, scheduled for today at 1:00pm  Objective: Weight change:   Intake/Output Summary (Last 24 hours) at 02/21/11 1111 Last data filed at 02/21/11 0530  Gross per 24 hour  Intake 1927.5 ml  Output   1525 ml  Net  402.5 ml   Blood pressure 162/89, pulse 103, temperature 97.4 F (36.3 C), temperature source Oral, resp. rate 18, height 5\' 2"  (1.575 m), weight 67.268 kg (148 lb 4.8 oz), SpO2 96.00%.  Physical Exam: General: Alert and awake, oriented x3, not in any acute distress. HEENT: anicteric sclera, pupils reactive to light and accommodation, EOMI CVS: S1-S2 clear, sinus tachycardia, no murmur rubs or gallops Chest: clear to auscultation bilaterally, no wheezing Abdomen: soft, no significant abdominal tenderness to deep palpation  Extremities: no cyanosis, clubbing or edema noted bilaterally Neuro: Cranial nerves II-XII intact, no focal neurological deficits  Lab Results:  [facility] 02/20/11 0449  WBC 6.6  HGB 15.9  HCT 46.5  PLT 266   BMET  Basename 02/20/11 0449 02/19/11 0420  NA 140 136  K 3.7 3.0*  CL 102 98  CO2 27 26  GLUCOSE 105* 117*  BUN 15 15  CREATININE 0.86 0.68  CALCIUM 9.4 9.6    Micro Results: No results found for this or any previous visit (from the past 240 hour(s)).  Studies/Results: US Abdomen Complete  02/15/2011  *RADIOLOGY REPORT*  Clinical Data:  Abdominal pain, nausea, vomiting  COMPLETE ABDOMINAL ULTRASOUND  Comparison:  None.  Findings:  Gallbladder:  Multiple mobile gallstones are noted within gallbladder the largest measures 5 mm.  No thickening of the gallbladder wall.  No sonographic Murphy's sign.  Common bile duct:  Within normal limits measures 5 mm in diameter.  Liver:  Multiple hypoechoic cysts are noted within liver the largest in the left hepatic lobe measures 2.5 x 2.5 cm.  IVC:  Limited visualization due to abundant bowel gas  Pancreas:  Not visualized due to abundant bowel  gas  Spleen:  Measures 7 cm in length.  Normal echogenicity.  Right Kidney:  Measures 7 cm in length.  No hydronephrosis or diagnostic renal calculus  Left Kidney:  Measures 11.6 cm in length.  No hydronephrosis or diagnostic renal calculus  Abdominal aorta: Not well visualized due to abundant bowel gas.  IMPRESSION:  1.  Mobile gallstones are noted within gallbladder the largest measures 5 mm.  No thickening of gallbladder wall or sonographic Murphy's sign. 2.  Normal CBD. 3.  Hepatic cysts are noted. 4.  No hydronephrosis or diagnostic renal calculus.  Original Report Authenticated By: Natasha Mead, M.D.   Dg Abd Acute W/chest  02/15/2011  *RADIOLOGY REPORT*  Clinical Data: Midsternal pain down to naval, nausea and vomiting, diverticulosis  ACUTE ABDOMEN SERIES (ABDOMEN 2 VIEW & CHEST 1 VIEW)  Comparison: 03/02/2007; abdominal MRI - 11/19/2008; AP chest radiograph - 12/16/2010  Findings:  Grossly unchanged cardiac silhouette and mediastinal contours post median sternotomy and CABG.  No focal parenchymal opacities.  No pleural effusion or pneumothorax.  Nonobstructive bowel gas pattern.  Moderate colonic stool burden. No pneumoperitoneum, pneumatosis or portal venous gas.  No acute osseous abnormalities.  IMPRESSION: 1.  Moderate colonic stool burden without evidence of obstruction.  2.  No acute cardiopulmonary disease.  Original Report Authenticated By: Waynard Reeds, M.D.    Medications: Scheduled Meds:    . aspirin  300 mg Rectal Daily  . budesonide  0.25 mg Nebulization BID  . enalaprilat  0.625 mg Intravenous Q6H  . enoxaparin  40 mg Subcutaneous QHS  . imipenem-cilastatin  500 mg Intravenous Q8H  . ipratropium  0.5 mg Nebulization BID  . levalbuterol  0.63 mg Nebulization BID  . metoprolol  10 mg Intravenous Q4H  . pantoprazole (PROTONIX) IV  40 mg Intravenous QHS  . DISCONTD: metoprolol  5 mg Intravenous Q4H   Continuous Infusions:    . dextrose 5 % and 0.45% NaCl 50 mL/hr at 02/20/11  2300   PRN Meds:.HYDROmorphone (DILAUDID) injection, LORazepam, metoprolol, ondansetron (ZOFRAN) IV  Assessment/Plan: 1. Acute pancreatitis: Likely secondary to gallstone pancreatitis, improving. - Continue n.p.o., IV fluids, Primaxin IV, General surgery following, laparoscopic cholecystectomy today  - Cleared by cardiology for surgery  2. history of COPD/asthma: - Will change to Xopenex and Atrovent nebulizers twice a day, Pulmicort twice a day for his home schedule. Currently stable.  3. history of coronary disease: Stable history of CABG, cardiac catheter in March 2012, EF 65-70%.  Patient is on aspirin, beta blocker, statins and Plavix, currently on hold due to acute pancreatitis - On aspirin P.R, IV Lopressor. Plavix on hold for pending surgery. Will restart Plavix after the surgery when cleared by general surgery.   4. hypertension: Currently elevated, on IV Lopressor  5. history of atrial fibrillation paroxysmal:  Currently sinus tachycardia, elevated during activities: Not on any anticoagulation outpatient, on aspirin PR. Plavix on hold for surgery. IV Lopressor increased by cardiology.  6. Anxiety: on IV when necessary Ativan  7. DVT prophylaxis on Lovenox   [name] 02/21/2011, 11:11 AM

## 2011-02-21 NOTE — Progress Notes (Signed)
UR completed 

## 2011-02-21 NOTE — Anesthesia Postprocedure Evaluation (Signed)
  Anesthesia Post-op Note  Patient: [name]  Procedure(s) Performed:  LAPAROSCOPIC CHOLECYSTECTOMY WITH INTRAOPERATIVE CHOLANGIOGRAM  Patient Location: PACU  Anesthesia Type: General  Level of Consciousness: awake and alert   Airway and Oxygen Therapy: Patient Spontanous Breathing  Post-op Pain: mild  Post-op Assessment: Post-op Vital signs reviewed, Patient's Cardiovascular Status Stable, Respiratory Function Stable, Patent Airway and No signs of Nausea or vomiting  Post-op Vital Signs: stable  Complications: No apparent anesthesia complications 

## 2011-02-21 NOTE — Transfer of Care (Signed)
Immediate Anesthesia Transfer of Care Note  Patient: [name]  Procedure(s) Performed:  LAPAROSCOPIC CHOLECYSTECTOMY WITH INTRAOPERATIVE CHOLANGIOGRAM  Patient Location: PACU  Anesthesia Type: General  Level of Consciousness: awake and alert   Airway & Oxygen Therapy: Patient Spontanous Breathing and Patient connected to face mask  Post-op Assessment: Report given to PACU RN, Post -op Vital signs reviewed and stable and Patient moving all extremities X 4  Post vital signs: Reviewed and stable  Complications: No apparent anesthesia complications

## 2011-02-22 ENCOUNTER — Inpatient Hospital Stay (HOSPITAL_COMMUNITY): Payer: Medicare Other

## 2011-02-22 LAB — BASIC METABOLIC PANEL
Calcium: 8.8 mg/dL (ref 8.4–10.5)
GFR calc non Af Amer: 79 mL/min — ABNORMAL LOW (ref >90)
Sodium: 137 mEq/L (ref 135–145)

## 2011-02-22 LAB — CBC
MCH: 28.6 pg (ref 26.0–34.0)
MCHC: 32.8 g/dL (ref 30.0–36.0)
Platelets: 215 10*3/uL (ref 150–400)
RDW: 13.5 % (ref 11.5–15.5)

## 2011-02-22 MED ORDER — LORAZEPAM 1 MG PO TABS: 1.0000 mg | ORAL_TABLET | Freq: Four times a day (QID) | ORAL | Status: DC | PRN

## 2011-02-22 MED ORDER — ASPIRIN 81 MG PO CHEW
81.0000 mg | CHEWABLE_TABLET | Freq: Every day | ORAL | Status: DC
  Administered 2011-02-22 – 2011-02-23 (×2): 81 mg via ORAL
  Filled 2011-02-22 (×2): qty 1

## 2011-02-22 MED ORDER — BISOPROLOL FUMARATE 10 MG PO TABS
10.0000 mg | ORAL_TABLET | Freq: Every day | ORAL | Status: DC
  Administered 2011-02-22 – 2011-02-23 (×2): 10 mg via ORAL
  Filled 2011-02-22 (×2): qty 1

## 2011-02-22 MED ORDER — PANTOPRAZOLE SODIUM 40 MG PO TBEC
40.0000 mg | DELAYED_RELEASE_TABLET | Freq: Every day | ORAL | Status: DC
  Administered 2011-02-22: 40 mg via ORAL
  Filled 2011-02-22 (×2): qty 1

## 2011-02-22 MED ORDER — POTASSIUM CHLORIDE CRYS ER 20 MEQ PO TBCR
40.0000 meq | EXTENDED_RELEASE_TABLET | Freq: Once | ORAL | Status: AC
  Administered 2011-02-22: 40 meq via ORAL
  Filled 2011-02-22: qty 2

## 2011-02-22 MED ORDER — CLONIDINE HCL 0.3 MG PO TABS
0.3000 mg | ORAL_TABLET | Freq: Two times a day (BID) | ORAL | Status: DC
  Administered 2011-02-22 – 2011-02-23 (×3): 0.3 mg via ORAL
  Filled 2011-02-22 (×5): qty 1

## 2011-02-22 NOTE — Progress Notes (Signed)
1 Day Post-Op  Subjective: Feel good, No pain, eating every thing they bring.  Objective: Vital signs in last 24 hours: Temp:  [97.4 F (36.3 C)-99 F (37.2 C)] 97.4 F (36.3 C) (11/14 1300) Pulse Rate:  [67-112] 67  (11/14 1300) Resp:  [15-26] 18  (11/14 1300) BP: (94-212)/(58-130) 101/58 mmHg (11/14 1300) SpO2:  [93 %-100 %] 93 % (11/14 0916) Last BM Date: 02/21/11  Intake/Output from previous day: 11/13 0701 - 11/14 0700 In: 4560 [P.O.:360; I.V.:4200] Out: 2340 [Urine:2325; Blood:15] Intake/Output this shift: Total I/O In: 600 [P.O.:600] Out: 575 [Urine:575]  General appearance: alert, cooperative and no distress GI: soft, non-tender; bowel sounds normal; no masses,  no organomegaly and Incisons look good, no pain.  Lab Results:   [facility] 02/22/11 0505 02/20/11 0449  WBC 7.2 6.6  HGB 12.4* 15.9  HCT 37.8* 46.5  PLT 215 266    BMET  Basename 02/22/11 0505 02/20/11 0449  NA 137 140  K 3.0* 3.7  CL 100 102  CO2 30 27  GLUCOSE 96 105*  BUN 11 15  CREATININE 0.94 0.86  CALCIUM 8.8 9.4   PT/INR No results found for this basename: LABPROT:2,INR:2 in the last 72 hours   Studies/Results: Dg Cholangiogram Operative  02/22/2011  *RADIOLOGY REPORT*  Clinical Data:   Gallstone pancreatitis.  Right upper quadrant pain.  INTRAOPERATIVE CHOLANGIOGRAM  Technique:  Cholangiographic images from the C-arm fluoroscopic device were submitted for interpretation post-operatively.  Please see the procedural report for the amount of contrast and the fluoroscopy time utilized.  Comparison:  None.  Findings:  Intraoperative cholangiogram demonstrates flow into the duodenum with slight reflux into the pancreatic duct.  There is slight narrowing of the distal common bile duct without a visible stone.  There is no dilatation of the common hepatic or common bile duct.  IMPRESSION: Probable spasm in the distal common bile duct.  Otherwise, normal.  Original Report Authenticated By: Gwynn Burly, M.D.    Anti-infectives: Anti-infectives     Start     Dose/Rate Route Frequency Ordered Stop   02/15/11 2200   imipenem-cilastatin (PRIMAXIN) 500 mg in sodium chloride 0.9 % 100 mL IVPB  Status:  Discontinued        500 mg 200 mL/hr over 30 Minutes Intravenous 3 times per day 02/15/11 2011 02/21/11 1426         Current Facility-Administered Medications  Medication Dose Route Frequency Provider Last Rate Last Dose  . amLODipine (NORVASC) tablet 10 mg  10 mg Oral Daily Ripudeep Rai   10 mg at 02/22/11 1006  . aspirin chewable tablet 81 mg  81 mg Oral Daily Lewayne Bunting, MD   81 mg at 02/22/11 0934  . bisoprolol (ZEBETA) tablet 10 mg  10 mg Oral Daily Lewayne Bunting, MD   10 mg at 02/22/11 0934  . budesonide (PULMICORT) nebulizer solution 0.25 mg  0.25 mg Nebulization BID Ripudeep Rai   0.25 mg at 02/22/11 0916  . cloNIDine (CATAPRES) tablet 0.3 mg  0.3 mg Oral BID Lewayne Bunting, MD   0.3 mg at 02/22/11 0934  . heparin injection 5,000 Units  5,000 Units Subcutaneous Q8H Valarie Merino, MD   5,000 Units at 02/22/11 0603  . HYDROmorphone (DILAUDID) injection 1 mg  1 mg Intravenous Q4H PRN Ripudeep Rai   1 mg at 02/16/11 2210  . ipratropium (ATROVENT) nebulizer solution 0.5 mg  0.5 mg Nebulization BID Ripudeep Rai   0.5 mg at 02/22/11 0916  .  lactated ringers infusion   Intravenous Continuous Valarie Merino, MD 100 mL/hr at 02/22/11 0700    . levalbuterol (XOPENEX) nebulizer solution 0.63 mg  0.63 mg Nebulization BID Ripudeep Rai   0.63 mg at 02/22/11 0916  . LORazepam (ATIVAN) tablet 1 mg  1 mg Oral Q6H PRN Ripudeep Rai      . pantoprazole (PROTONIX) EC tablet 40 mg  40 mg Oral Q1200 Ripudeep Rai      . potassium chloride SA (K-DUR,KLOR-CON) CR tablet 40 mEq  40 mEq Oral Once Lewayne Bunting, MD   40 mEq at 02/22/11 0936  . DISCONTD: aspirin suppository 300 mg  300 mg Rectal Daily Darrol Jump, PA   300 mg at 02/20/11 1114  . DISCONTD: cloNIDine (CATAPRES)  tablet 0.2 mg  0.2 mg Oral BID Ripudeep Rai   0.2 mg at 02/21/11 1843  . DISCONTD: enalaprilat (VASOTEC) injection 0.625 mg  0.625 mg Intravenous Q6H Charles Nwaokocha   0.625 mg at 02/22/11 0557  . DISCONTD: enoxaparin (LOVENOX) injection 40 mg  40 mg Subcutaneous QHS Charles Nwaokocha   40 mg at 02/20/11 2216  . DISCONTD: fentaNYL (SUBLIMAZE) injection 25-50 mcg  25-50 mcg Intravenous Q5 min PRN Riesa Pope, MD      . DISCONTD: labetalol (NORMODYNE,TRANDATE) injection 2.5 mg  2.5 mg Intravenous Q10 min PRN Jill Side, MD   2.5 mg at 02/21/11 1541  . DISCONTD: LORazepam (ATIVAN) injection 1 mg  1 mg Intravenous Q6H PRN Ripudeep Rai   1 mg at 02/21/11 1814  . DISCONTD: metoprolol (LOPRESSOR) injection 10 mg  10 mg Intravenous Q4H Lewayne Bunting, MD   10 mg at 02/22/11 0557  . DISCONTD: pantoprazole (PROTONIX) injection 40 mg  40 mg Intravenous QHS Charles Nwaokocha   40 mg at 02/21/11 2308  . DISCONTD: promethazine (PHENERGAN) injection 6.25-12.5 mg  6.25-12.5 mg Intravenous Q15 min PRN Jill Side, MD        Assessment/Plan   1. Gallstone Pancreatitis: S/P Cholecystectomy  Doing very well. 2. Patient Active Problem List  Diagnoses  . DYSLIPIDEMIA  . HYPERTENSION  . CORONARY HEART DISEASE  . FIBRILLATION, ATRIAL  . HEMORRHOIDS-INTERNAL  . ALLERGIC RHINITIS  . ASTHMA  . ESOPHAGEAL STRICTURE  . GERD  . HEPATIC CYST  . Obstructive sleep apnea  . PROSTATITIS, HX OF  . TOBACCO USE, QUIT  . HYPERSOMNIA  . Hematoma  . Hypokalemia  . Diarrhea  . Viral syndrome  . Pancreatitis, acute  Plan: Advance diet, may go home when OK with DR Rai.  F/U 03-07-11 @ 2:30 PM.  [facility]. I will give him an appointment card with information.  He will be seen in [facility]. Sutures in L arm from skin tear before admission 14 days out.  Will let nurse remove here.  LOS: 7 days    [name] 02/22/2011

## 2011-02-22 NOTE — Progress Notes (Signed)
PHARMACIST - PHYSICIAN COMMUNICATION DR:   Isidoro Donning CONCERNING: Protonix IV to Oral Route Change Policy  RECOMMENDATION: This patient is receiving Protonix by the intravenous route.  Based on criteria approved by the Pharmacy and Therapeutics Committee, this is being converted to the equivalent oral dose form.   DESCRIPTION: These criteria include:  The patient is does not exhibit a GI malabsorption state  Tolerating diet of full liquids or better  The patient is eating (either orally or via tube) and/or has been taking other orally administered medications for a least 24 hours  If you have questions about this conversion, please contact the Pharmacy Department  []   [phone] )  [facility] []   [phone] )  [facility]  []   [phone] )  [facility] [x]   [phone] )  [facility]

## 2011-02-22 NOTE — Progress Notes (Signed)
Spoke with patient and wife at bedside. States he is getting around without issues. No d/c needs identified. Confirmed PCP with Dr. Felicity Coyer and understand will need a f/u appt after d/c.

## 2011-02-22 NOTE — Progress Notes (Signed)
Subjective:  S: No specific complaints, tolerated full liquid diet for lunch  Objective: Weight change:   Intake/Output Summary (Last 24 hours) at 02/22/11 1300 Last data filed at 02/22/11 1000  Gross per 24 hour  Intake   5040 ml  Output   2540 ml  Net   2500 ml   Blood pressure 168/71, pulse 78, temperature 97.8 F (36.6 C), temperature source Oral, resp. rate 18, height 5\' 2"  (1.575 m), weight 67.268 kg (148 lb 4.8 oz), SpO2 93.00%.  Physical Exam: General: Alert and awake, oriented x3, not in any acute distress. HEENT: anicteric sclera, pupils reactive to light and accommodation, EOMI CVS: S1-S2 clear, sinus tachycardia, no murmur rubs or gallops Chest: clear to auscultation bilaterally, no wheezing Abdomen: soft, no significant abdominal tenderness to deep palpation, incision clean  Extremities: no cyanosis, clubbing or edema noted bilaterally Neuro: Cranial nerves II-XII intact, no focal neurological deficits  Lab Results:  Basename 02/22/11 0505 02/20/11 0449  WBC 7.2 6.6  HGB 12.4* 15.9  HCT 37.8* 46.5  PLT 215 266   BMET  Basename 02/22/11 0505 02/20/11 0449  NA 137 140  K 3.0* 3.7  CL 100 102  CO2 30 27  GLUCOSE 96 105*  BUN 11 15  CREATININE 0.94 0.86  CALCIUM 8.8 9.4    Micro Results: Recent Results (from the past 240 hour(s))  SURGICAL PCR SCREEN     Status: Normal   Collection Time   02/21/11 10:50 AM      Component Value Range Status Comment   MRSA, PCR NEGATIVE  NEGATIVE  Final    Staphylococcus aureus NEGATIVE  NEGATIVE  Final     Studies/Results: US Abdomen Complete  02/15/2011  *RADIOLOGY REPORT*  Clinical Data:  Abdominal pain, nausea, vomiting  COMPLETE ABDOMINAL ULTRASOUND  Comparison:  None.  Findings:  Gallbladder:  Multiple mobile gallstones are noted within gallbladder the largest measures 5 mm.  No thickening of the gallbladder wall.  No sonographic Murphy's sign.  Common bile duct:  Within normal limits measures 5 mm in diameter.   Liver:  Multiple hypoechoic cysts are noted within liver the largest in the left hepatic lobe measures 2.5 x 2.5 cm.  IVC:  Limited visualization due to abundant bowel gas  Pancreas:  Not visualized due to abundant bowel gas  Spleen:  Measures 7 cm in length.  Normal echogenicity.  Right Kidney:  Measures 7 cm in length.  No hydronephrosis or diagnostic renal calculus  Left Kidney:  Measures 11.6 cm in length.  No hydronephrosis or diagnostic renal calculus  Abdominal aorta: Not well visualized due to abundant bowel gas.  IMPRESSION:  1.  Mobile gallstones are noted within gallbladder the largest measures 5 mm.  No thickening of gallbladder wall or sonographic Murphy's sign. 2.  Normal CBD. 3.  Hepatic cysts are noted. 4.  No hydronephrosis or diagnostic renal calculus.  Original Report Authenticated By: Natasha Mead, M.D.   Dg Abd Acute W/chest  02/15/2011  *RADIOLOGY REPORT*  Clinical Data: Midsternal pain down to naval, nausea and vomiting, diverticulosis  ACUTE ABDOMEN SERIES (ABDOMEN 2 VIEW & CHEST 1 VIEW)  Comparison: 03/02/2007; abdominal MRI - 11/19/2008; AP chest radiograph - 12/16/2010  Findings:  Grossly unchanged cardiac silhouette and mediastinal contours post median sternotomy and CABG.  No focal parenchymal opacities.  No pleural effusion or pneumothorax.  Nonobstructive bowel gas pattern.  Moderate colonic stool burden. No pneumoperitoneum, pneumatosis or portal venous gas.  No acute osseous abnormalities.  IMPRESSION: 1.  Moderate colonic stool burden without evidence of obstruction.  2.  No acute cardiopulmonary disease.  Original Report Authenticated By: Waynard Reeds, M.D.    Medications: Scheduled Meds:    . amLODipine  10 mg Oral Daily  . aspirin  81 mg Oral Daily  . bisoprolol  10 mg Oral Daily  . budesonide  0.25 mg Nebulization BID  . bupivacaine liposome  20 mL Infiltration Once  . cloNIDine  0.3 mg Oral BID  . heparin  5,000 Units Subcutaneous Q8H  . ipratropium  0.5 mg  Nebulization BID  . levalbuterol  0.63 mg Nebulization BID  . pantoprazole (PROTONIX) IV  40 mg Intravenous QHS  . potassium chloride  40 mEq Oral Once  . DISCONTD: aspirin  300 mg Rectal Daily  . DISCONTD: cloNIDine  0.2 mg Oral BID  . DISCONTD: enalaprilat  0.625 mg Intravenous Q6H  . DISCONTD: enoxaparin  40 mg Subcutaneous QHS  . DISCONTD: imipenem-cilastatin  500 mg Intravenous Q8H  . DISCONTD: metoprolol  10 mg Intravenous Q4H   Continuous Infusions:    . lactated ringers 100 mL/hr at 02/22/11 0700  . DISCONTD: dextrose 5 % and 0.45% NaCl 50 mL/hr at 02/20/11 2300  . DISCONTD: lactated ringers 1,000 mL (02/21/11 1124)   PRN Meds:.HYDROmorphone (DILAUDID) injection, LORazepam, DISCONTD: fentaNYL, DISCONTD: iohexol, DISCONTD: labetalol, DISCONTD: lactated ringers, DISCONTD: LORazepam, DISCONTD: metoprolol, DISCONTD: ondansetron (ZOFRAN) IV, DISCONTD: promethazine, DISCONTD: sodium chloride irrigation  Assessment/Plan: 1. Acute pancreatitis: secondary to gallstone pancreatitis, improving. - Status post laparoscopic cholecystectomy, doing well - Diet advance to solids, heart healthy today  2. history of COPD/asthma: -Continue Xopenex and Atrovent nebulizers twice a day, Pulmicort twice a day per his home schedule. Currently stable.  3. history of coronary disease: Stable history of CABG, cardiac catheter in March 2012, EF 65-70%.   - On started on by mouth aspirin, beta blocker and all by mouth antihypertensives..  - Restart Plavix when okayed by surgery   4. hypertension: Currently elevated, restart amlodipine, BB, clonidine per home schedule  5. history of atrial fibrillation paroxysmal:   - Not on any anticoagulation outpatient, continue aspirin, restart Plavix when okayed by surgery. On beta blocker.   6. Anxiety: Place on when necessary Ativan by mouth  7. DVT prophylaxis on heparin  Disposition: DC home in a.m. if okay with surgery   RAI,RIPUDEEP 02/22/2011, 1:00  PM

## 2011-02-22 NOTE — Progress Notes (Signed)
Subjective: Denies CP or SOB; no palpitations; mild abdominal discomfort   Physical Exam:   Blood pressure 168/71, pulse 78, temperature 97.8 F (36.6 C), temperature source Oral, resp. rate 18, height 5\' 2"  (1.575 m), weight 148 lb 4.8 oz (67.268 kg), SpO2 98.00%. Temp:  [97.1 F (36.2 C)-99 F (37.2 C)] 97.8 F (36.6 C) (11/14 0516) Pulse Rate:  [73-125] 78  (11/14 0516) Resp:  [12-26] 18  (11/14 0516) BP: (94-226)/(64-160) 168/71 mmHg (11/14 0516) SpO2:  [95 %-100 %] 98 % (11/14 0516) Weight change:  I/O 11/13 0701 - 11/14 0700 In: 3760 [P.O.:360; I.V.:3400] Out: 2340 [Urine:2325; Blood:15]  HEENT-normal/normal eyelids Neck supple chest - CTA/ normal expansion CV - regular rate and rhythm/normal S1 and S2; no murmurs, rubs or gallops Abdomen -s/p abdominal surgery 2+ femoral pulses, no bruits Ext-no edema, chords, 2+ DP Neuro-grossly nonfocal  Results for orders placed during the hospital encounter of 02/15/11 (from the past 48 hour(s))  GLUCOSE, CAPILLARY     Status: Normal   Collection Time   02/20/11  9:42 PM      Component Value Range Comment   Glucose-Capillary 93  70 - 99 (mg/dL)    Comment 1 Notify RN     TSH     Status: Normal   Collection Time   02/21/11  4:35 AM      Component Value Range Comment   TSH 1.212  0.350 - 4.500 (uIU/mL)   SURGICAL PCR SCREEN     Status: Normal   Collection Time   02/21/11 10:50 AM      Component Value Range Comment   MRSA, PCR NEGATIVE  NEGATIVE     Staphylococcus aureus NEGATIVE  NEGATIVE    CBC     Status: Abnormal   Collection Time   02/22/11  5:05 AM      Component Value Range Comment   WBC 7.2  4.0 - 10.5 (K/uL)    RBC 4.33  4.22 - 5.81 (MIL/uL)    Hemoglobin 12.4 (*) 13.0 - 17.0 (g/dL)    HCT 16.1 (*) 09.6 - 52.0 (%)    MCV 87.3  78.0 - 100.0 (fL)    MCH 28.6  26.0 - 34.0 (pg)    MCHC 32.8  30.0 - 36.0 (g/dL)    RDW 04.5  40.9 - 81.1 (%)    Platelets 215  150 - 400 (K/uL)   BASIC METABOLIC PANEL     Status:  Abnormal   Collection Time   02/22/11  5:05 AM      Component Value Range Comment   Sodium 137  135 - 145 (mEq/L)    Potassium 3.0 (*) 3.5 - 5.1 (mEq/L)    Chloride 100  96 - 112 (mEq/L)    CO2 30  19 - 32 (mEq/L)    Glucose, Bld 96  70 - 99 (mg/dL)    BUN 11  6 - 23 (mg/dL)    Creatinine, Ser 9.14  0.50 - 1.35 (mg/dL)    Calcium 8.8  8.4 - 10.5 (mg/dL)    GFR calc non Af Amer 79 (*) >90 (mL/min)    GFR calc Af Amer >90  >90 (mL/min)     Assessment/Plan Patient Active Hospital Problem List: Pancreatitis, acute (02/16/2011) S/p cholecystectomy; management per surgery HYPERTENSION (05/10/2007) Patient now taking po s/p surgery; resume all preadmission meds for BP. CORONARY HEART DISEASE (05/10/2007) Resume Po ASA; resume plavix when ok with surgery.  Patient needs to fu with Dr. Antoine Poche in 6-8  weeks for cardiac issues. Please call with questions.  Olga Millers MD 02/22/2011, 6:29 AM   Patient is being transferred to telemetry [facility]

## 2011-02-23 ENCOUNTER — Encounter (HOSPITAL_COMMUNITY): Payer: Self-pay | Admitting: Surgery

## 2011-02-23 LAB — BASIC METABOLIC PANEL
CO2: 33 mEq/L — ABNORMAL HIGH (ref 19–32)
Chloride: 103 mEq/L (ref 96–112)
GFR calc Af Amer: >90 mL/min (ref >90)
Potassium: 3.5 mEq/L (ref 3.5–5.1)
Sodium: 140 mEq/L (ref 135–145)

## 2011-02-23 LAB — CBC
MCV: 88.1 fL (ref 78.0–100.0)
Platelets: 190 10*3/uL (ref 150–400)
RBC: 3.86 MIL/uL — ABNORMAL LOW (ref 4.22–5.81)
RDW: 13.6 % (ref 11.5–15.5)
WBC: 5.1 10*3/uL (ref 4.0–10.5)

## 2011-02-23 MED ORDER — PROMETHAZINE HCL 12.5 MG PO TABS: 12.5000 mg | ORAL_TABLET | Freq: Four times a day (QID) | ORAL | Status: DC | PRN

## 2011-02-23 MED ORDER — GUAIFENESIN 100 MG/5ML PO SOLN
5.0000 mL | Freq: Four times a day (QID) | ORAL | Status: DC | PRN
  Administered 2011-02-23: 100 mg via ORAL
  Filled 2011-02-23: qty 10

## 2011-02-23 MED ORDER — HYDROCODONE-ACETAMINOPHEN 5-500 MG PO CAPS: 1.0000 | ORAL_CAPSULE | Freq: Four times a day (QID) | ORAL | Status: DC | PRN

## 2011-02-23 MED ORDER — GUAIFENESIN ER 600 MG PO TB12
600.0000 mg | ORAL_TABLET | Freq: Two times a day (BID) | ORAL | Status: DC
  Administered 2011-02-23 (×2): 600 mg via ORAL
  Filled 2011-02-23 (×3): qty 1

## 2011-02-23 NOTE — Progress Notes (Signed)
2 Days Post-Op  Subjective: Doing well ready to go home.  Objective: Vital signs in last 24 hours: Temp:  [97.4 F (36.3 C)-98.2 F (36.8 C)] 97.9 F (36.6 C) (11/15 0541) Pulse Rate:  [64-71] 70  (11/15 0541) Resp:  [16-20] 16  (11/15 0541) BP: (101-115)/(58-69) 115/69 mmHg (11/15 0541) SpO2:  [94 %-95 %] 95 % (11/15 0837) Last BM Date: 02/23/11  Intake/Output from previous day: 11/14 0701 - 11/15 0700 In: 2040 [P.O.:840; I.V.:1200] Out: 1425 [Urine:1425] Intake/Output this shift: Total I/O In: 360 [P.O.:360] Out: -   General appearance: no distress GI: soft, non-tender; bowel sounds normal; no masses,  no organomegaly and incisions look great  Lab Results:   Basename 02/23/11 0435 02/22/11 0505  WBC 5.1 7.2  HGB 10.8* 12.4*  HCT 34.0* 37.8*  PLT 190 215    BMET  Basename 02/23/11 0435 02/22/11 0505  NA 140 137  K 3.5 3.0*  CL 103 100  CO2 33* 30  GLUCOSE 106* 96  BUN 11 11  CREATININE 0.94 0.94  CALCIUM 8.7 8.8   PT/INR No results found for this basename: LABPROT:2,INR:2 in the last 72 hours   Studies/Results: Dg Cholangiogram Operative  02/22/2011  *RADIOLOGY REPORT*  Clinical Data:   Gallstone pancreatitis.  Right upper quadrant pain.  INTRAOPERATIVE CHOLANGIOGRAM  Technique:  Cholangiographic images from the C-arm fluoroscopic device were submitted for interpretation post-operatively.  Please see the procedural report for the amount of contrast and the fluoroscopy time utilized.  Comparison:  None.  Findings:  Intraoperative cholangiogram demonstrates flow into the duodenum with slight reflux into the pancreatic duct.  There is slight narrowing of the distal common bile duct without a visible stone.  There is no dilatation of the common hepatic or common bile duct.  IMPRESSION: Probable spasm in the distal common bile duct.  Otherwise, normal.  Original Report Authenticated By: Gwynn Burly, M.D.    Anti-infectives: Anti-infectives     Start      Dose/Rate Route Frequency Ordered Stop   02/15/11 2200   imipenem-cilastatin (PRIMAXIN) 500 mg in sodium chloride 0.9 % 100 mL IVPB  Status:  Discontinued        500 mg 200 mL/hr over 30 Minutes Intravenous 3 times per day 02/15/11 2011 02/21/11 1426         Current Facility-Administered Medications  Medication Dose Route Frequency Provider Last Rate Last Dose  . amLODipine (NORVASC) tablet 10 mg  10 mg Oral Daily Ripudeep Jenna Luo, MD   10 mg at 02/23/11 0939  . aspirin chewable tablet 81 mg  81 mg Oral Daily Lewayne Bunting, MD   81 mg at 02/23/11 0941  . bisoprolol (ZEBETA) tablet 10 mg  10 mg Oral Daily Lewayne Bunting, MD   10 mg at 02/23/11 0940  . budesonide (PULMICORT) nebulizer solution 0.25 mg  0.25 mg Nebulization BID Ripudeep K Rai, MD   0.25 mg at 02/23/11 0835  . cloNIDine (CATAPRES) tablet 0.3 mg  0.3 mg Oral BID Lewayne Bunting, MD   0.3 mg at 02/23/11 0941  . guaiFENesin (MUCINEX) 12 hr tablet 600 mg  600 mg Oral BID Anastassia Doutova   600 mg at 02/23/11 0941  . guaiFENesin (ROBITUSSIN) 100 MG/5ML solution 100 mg  5 mL Oral Q6H PRN Anastassia Doutova   100 mg at 02/23/11 0347  . heparin injection 5,000 Units  5,000 Units Subcutaneous Q8H Valarie Merino, MD   5,000 Units at 02/23/11 0537  .  HYDROmorphone (DILAUDID) injection 1 mg  1 mg Intravenous Q4H PRN Ripudeep Jenna Luo, MD   1 mg at 02/16/11 2210  . ipratropium (ATROVENT) nebulizer solution 0.5 mg  0.5 mg Nebulization BID Ripudeep K Rai, MD   0.5 mg at 02/23/11 0835  . levalbuterol (XOPENEX) nebulizer solution 0.63 mg  0.63 mg Nebulization BID Ripudeep Jenna Luo, MD   0.63 mg at 02/23/11 0835  . LORazepam (ATIVAN) tablet 1 mg  1 mg Oral Q6H PRN Ripudeep K Rai, MD      . pantoprazole (PROTONIX) EC tablet 40 mg  40 mg Oral Q1200 Ripudeep Jenna Luo, MD   40 mg at 02/22/11 1746  . DISCONTD: LORazepam (ATIVAN) injection 1 mg  1 mg Intravenous Q6H PRN Ripudeep Jenna Luo, MD   1 mg at 02/21/11 1814  . DISCONTD: pantoprazole (PROTONIX)  injection 40 mg  40 mg Intravenous QHS Charles N[place]ocha   40 mg at 02/21/11 2308    Assessment/Plan   1.S/P Cholecystectomy for Gallstone pancreatitis   2. Patient Active Problem List  Diagnoses  . DYSLIPIDEMIA  . HYPERTENSION  . CORONARY HEART DISEASE  . FIBRILLATION, ATRIAL  . HEMORRHOIDS-INTERNAL  . ALLERGIC RHINITIS  . ASTHMA  . ESOPHAGEAL STRICTURE  . GERD  . HEPATIC CYST  . Obstructive sleep apnea  . PROSTATITIS, HX OF  . TOBACCO USE, QUIT  . HYPERSOMNIA  . Hematoma  . Hypokalemia  . Diarrhea  . Viral syndrome  . Pancreatitis, acute   Plan home today. Follow-up 2 weeks.  LOS: 8 days    Shervon Kerwin 02/23/2011

## 2011-02-23 NOTE — Discharge Summary (Signed)
Physician Discharge Summary  Patient ID: KOSTANTINOS TALLMAN MRN: [id number] DOB/AGE: Jul 27, 1930 77 y.o.  Admit date: 02/15/2011 Discharge date: 02/23/2011  Primary Care Physician:  Rene Paci, MD, MD Primary cardiologist Dr. Antoine Poche  Discharge Diagnoses:   Present on Admission:  .Pancreatitis, acute secondary to gallstones status post laparoscopic cholecystectomy COPD mild exacerbation Coronary artery sees with sinus tachycardia likely exacerbated due to anxiety History of atrial fibrillation, remained in sinus rhythm during this hospitalization Hypertension Anxiety  Consults:  1. [facility] surgery                     2. Cardiology: Dr. Jola Schmidt    Discharge Medications: Discharge Medication List as of 02/23/2011 11:42 AM    START taking these medications   Details  hydrocodone-acetaminophen (LORCET-HD) 5-500 MG per capsule Take 1 capsule by mouth every 6 (six) hours as needed for pain., Starting 02/23/2011, Until Sun 03/05/11, Print    promethazine (PHENERGAN) 12.5 MG tablet Take 1 tablet (12.5 mg total) by mouth every 6 (six) hours as needed for nausea., Starting 02/23/2011, Until Thu 03/02/11, Print      CONTINUE these medications which have NOT CHANGED   Details  albuterol (PROVENTIL) (2.5 MG/3ML) 0.083% nebulizer solution Take 3 mLs (2.5 mg total) by nebulization 2 (two) times daily. DX:  493.90, Starting 10/14/2010, Until Sat 10/14/11, Normal    amLODipine (NORVASC) 5 MG tablet take 1 tablet by mouth twice a day, Normal    aspirin 81 MG tablet Take 81 mg by mouth daily. , Until Discontinued, Historical Med    bisoprolol (ZEBETA) 10 MG tablet Take 1 tablet (10 mg total) by mouth daily., Starting 07/07/2010, Until Discontinued, Normal    budesonide (PULMICORT) 0.25 MG/2ML nebulizer solution Take 2 mLs (0.25 mg total) by nebulization 2 (two) times daily., Starting 09/02/2010, Until Discontinued, Phone In    cetirizine (ZYRTEC) 10 MG tablet Take 10 mg by mouth  daily.  , Until Discontinued, Historical Med    cloNIDine (CATAPRES) 0.3 MG tablet  , Starting 02/09/2011, Until Discontinued, Historical Med    clopidogrel (PLAVIX) 75 MG tablet Take 1 tablet (75 mg total) by mouth daily., Starting 07/07/2010, Until Discontinued, Normal    latanoprost (XALATAN) 0.005 % ophthalmic solution Place 1 drop into both eyes daily.  , Until Discontinued, Historical Med    nitroGLYCERIN (NITROSTAT) 0.4 MG SL tablet Place 0.4 mg under the tongue every 5 (five) minutes as needed. For chest pain, Until Discontinued, Historical Med    omeprazole (PRILOSEC) 20 MG capsule Take 20 mg by mouth daily.  , Until Discontinued, Historical Med    potassium chloride SA (K-DUR,KLOR-CON) 20 MEQ tablet Take 1 tablet (20 mEq total) by mouth 2 (two) times daily., Starting 07/07/2010, Until Discontinued, Normal    pravastatin (PRAVACHOL) 80 MG tablet take 1 tablet by mouth every evening, Normal    Probiotic Product (PHILLIPS COLON HEALTH PO) Take 1 tablet by mouth daily. , Until Discontinued, Historical Med      STOP taking these medications     aspirin EC 81 MG tablet      NON FORMULARY          Significant Diagnostic Studies:  US Abdomen Complete  02/15/2011  *RADIOLOGY REPORT*  Clinical Data:  Abdominal pain, nausea, vomiting  COMPLETE ABDOMINAL ULTRASOUND  Comparison:  None.  Findings:  Gallbladder:  Multiple mobile gallstones are noted within gallbladder the largest measures 5 mm.  No thickening of the gallbladder wall.  No sonographic  Murphy's sign.  Common bile duct:  Within normal limits measures 5 mm in diameter.  Liver:  Multiple hypoechoic cysts are noted within liver the largest in the left hepatic lobe measures 2.5 x 2.5 cm.  IVC:  Limited visualization due to abundant bowel gas  Pancreas:  Not visualized due to abundant bowel gas  Spleen:  Measures 7 cm in length.  Normal echogenicity.  Right Kidney:  Measures 7 cm in length.  No hydronephrosis or diagnostic renal  calculus  Left Kidney:  Measures 11.6 cm in length.  No hydronephrosis or diagnostic renal calculus  Abdominal aorta: Not well visualized due to abundant bowel gas.  IMPRESSION:  1.  Mobile gallstones are noted within gallbladder the largest measures 5 mm.  No thickening of gallbladder wall or sonographic Murphy's sign. 2.  Normal CBD. 3.  Hepatic cysts are noted. 4.  No hydronephrosis or diagnostic renal calculus.  Original Report Authenticated By: Natasha Mead, M.D.   Dg Abd Acute W/chest  02/15/2011  *RADIOLOGY REPORT*  Clinical Data: Midsternal pain down to naval, nausea and vomiting, diverticulosis  ACUTE ABDOMEN SERIES (ABDOMEN 2 VIEW & CHEST 1 VIEW)  Comparison: 03/02/2007; abdominal MRI - 11/19/2008; AP chest radiograph - 12/16/2010  Findings:  Grossly unchanged cardiac silhouette and mediastinal contours post median sternotomy and CABG.  No focal parenchymal opacities.  No pleural effusion or pneumothorax.  Nonobstructive bowel gas pattern.  Moderate colonic stool burden. No pneumoperitoneum, pneumatosis or portal venous gas.  No acute osseous abnormalities.  IMPRESSION: 1.  Moderate colonic stool burden without evidence of obstruction.  2.  No acute cardiopulmonary disease.  Original Report Authenticated By: Waynard Reeds, M.D.    Brief H and P: For complete details please refer to admission H and P, but in brief patient is 77 year old male with history of coronary disease status post CABG, atrial fibrillation, asthma, hypertension presented with epigastric as well as right upper quadrant abdominal pain which started in early hours on the day of admission. Pain was associated with nausea and multiple episodes of vomiting. He denied any dysuria or hematuria any diarrhea or medication melena. Patient was taken to med [facility] by the EMS and was subsequently transferred to [facility] for further workup.  Hospital Course:  1. Acute pancreatitis: secondary to gallstone pancreatitis.  Patient was admitted to the medicine service. Lipase was elevated at 5268. AST and ALT were elevated as well. Ultrasound of the abdomen was done which showed mobilized gallstones within the gallbladder the largest about her 5 MM with no thickening of the gallbladder wall. Patient was placed on n.p.o. status with IV fluid hydration, pain control. General surgery was consulted. Patient was followed closely by general surgery. He underwent laparoscopic cholecystectomy on 02/21/2011. At the time of discharge patient is tolerating regular diet. Patient was cleared by general surgery to be discharged home. He will followup with Dr. Daphine Deutscher from [facility] surgery on 03/07/2011 for follow-up. Patient was also counseled to eat fat restricted diet. 2. history of COPD/asthma: Remained stable  - He was continued on Xopenex and Atrovent nebulizers twice a day, Pulmicort twice a day per his home schedule.   3. history of coronary disease: Stable history of CABG, cardiac catheter in March 2012, EF 65-70%. Patient was continued on aspirin and IV beta blocker. Plavix was held for the surgery which can be resumed now postop per general surgery recommendations.  4. hypertension: Patient was restarted on all his by mouth antihypertensives once he  started tolerating diet.    5. Anxiety: Patient has significant issues with anxiety during the hospitalization requiring Ativan. He is currently stable after the surgery he was followed closely by cardiology service as well due to his tachycardia issues which most likely would return by his anxiety attacks. He will followup for Dr. Antoine Poche in 4-6 weeks.    Day of Discharge BP 115/69  Pulse 70  Temp(Src) 97.9 F (36.6 C) (Oral)  Resp 16  Ht 5\' 2"  (1.575 m)  Wt 67.268 kg (148 lb 4.8 oz)  BMI 27.12 kg/m2  SpO2 95%  Results for orders placed during the hospital encounter of 02/15/11 (from the past 24 hour(s))  CBC     Status: Abnormal   Collection Time   02/23/11   4:35 AM      Component Value Range   WBC 5.1  4.0 - 10.5 (K/uL)   RBC 3.86 (*) 4.22 - 5.81 (MIL/uL)   Hemoglobin 10.8 (*) 13.0 - 17.0 (g/dL)   HCT 16.1 (*) 09.6 - 52.0 (%)   MCV 88.1  78.0 - 100.0 (fL)   MCH 28.0  26.0 - 34.0 (pg)   MCHC 31.8  30.0 - 36.0 (g/dL)   RDW 04.5  40.9 - 81.1 (%)   Platelets 190  150 - 400 (K/uL)  BASIC METABOLIC PANEL     Status: Abnormal   Collection Time   02/23/11  4:35 AM      Component Value Range   Sodium 140  135 - 145 (mEq/L)   Potassium 3.5  3.5 - 5.1 (mEq/L)   Chloride 103  96 - 112 (mEq/L)   CO2 33 (*) 19 - 32 (mEq/L)   Glucose, Bld 106 (*) 70 - 99 (mg/dL)   BUN 11  6 - 23 (mg/dL)   Creatinine, Ser 9.14  0.50 - 1.35 (mg/dL)   Calcium 8.7  8.4 - 78.2 (mg/dL)   GFR calc non Af Amer 79 (*) >90 (mL/min)   GFR calc Af Amer >90  >90 (mL/min)    Physical Exam: General: Alert and awake oriented x3 not in any acute distress. HEENT: anicteric sclera, pupils reactive to light and accommodation CVS: S1-S2 clear no murmur rubs or gallops Chest: clear to auscultation bilaterally, no wheezing rales or rhonchi Abdomen: soft nontender, nondistended, normal bowel sounds, no organomegaly Extremities: no cyanosis, clubbing or edema noted bilaterally Neuro: Cranial nerves II-XII intact, no focal neurological deficits  Disposition: Home Diet: Fat-free  Activity: As tolerated  Disposition and Follow-up: Discharge Orders    Future Appointments: Provider: Department: Dept Phone: Center:   03/07/2011 2:50 PM [facility] [phone] None   03/24/2011 8:45 AM Rollene Rotunda, MD [facility] [phone] [facility]   05/24/2011 9:15 AM Waymon Budge, MD [facility] [phone] None   06/14/2011 8:30 AM Rene Paci, MD [facility] [phone] [facility]     Future Orders Please Complete By Expires   Diet - low sodium heart healthy      Increase activity slowly      Discharge wound care:      Comments:   wash wound  with plain soap and water daily   Discharge instructions      Comments:   Fat restricted diet - ok to resume plavix - Donot lift more than 15lbs weight - wash wound with plain soap and water daily      DISCHARGE FOLLOW-UP Follow-up Information    Follow up with [facility]- Dr Baxter Kail on  03/07/2011. (at 2:30PM)       Follow up with Rene Paci, MD in 2 weeks.   Contact information:   [address] [facility] [address] [place] [address] [phone]       Follow up with Rollene Rotunda, MD. Make an appointment in 4 weeks.   Contact information:   [address] [place] [address] [phone]          Time spent on Discharge: 45 minutes  Signed: RAI,RIPUDEEP 02/23/2011, 1:56 PM

## 2011-02-24 NOTE — Anesthesia Postprocedure Evaluation (Deleted)
  Anesthesia Post-op Note  Patient: [name]  Procedure(s) Performed:  LAPAROSCOPIC CHOLECYSTECTOMY WITH INTRAOPERATIVE CHOLANGIOGRAM  Patient Location: PACU  Anesthesia Type: General  Level of Consciousness: awake and alert   Airway and Oxygen Therapy: Patient Spontanous Breathing  Post-op Pain: mild  Post-op Assessment: Post-op Vital signs reviewed, Patient's Cardiovascular Status Stable, Respiratory Function Stable, Patent Airway and No signs of Nausea or vomiting  Post-op Vital Signs: stable  Complications: No apparent anesthesia complications

## 2011-03-01 NOTE — Anesthesia Postprocedure Evaluation (Deleted)
  Anesthesia Post-op Note  Patient: [name]  Procedure(s) Performed:  LAPAROSCOPIC CHOLECYSTECTOMY WITH INTRAOPERATIVE CHOLANGIOGRAM  Patient Location: PACU  Anesthesia Type: General  Level of Consciousness: awake and alert   Airway and Oxygen Therapy: Patient Spontanous Breathing  Post-op Pain: mild  Post-op Assessment: Post-op Vital signs reviewed, Patient's Cardiovascular Status Stable, Respiratory Function Stable, Patent Airway and No signs of Nausea or vomiting  Post-op Vital Signs: stable  Complications: No apparent anesthesia complications 

## 2011-03-01 NOTE — Anesthesia Postprocedure Evaluation (Signed)
  Anesthesia Post-op Note  Patient: [name]  Procedure(s) Performed:  LAPAROSCOPIC CHOLECYSTECTOMY WITH INTRAOPERATIVE CHOLANGIOGRAM  Patient Location: PACU  Anesthesia Type: General  Level of Consciousness: awake and alert   Airway and Oxygen Therapy: Patient Spontanous Breathing  Post-op Pain: mild  Post-op Assessment: Post-op Vital signs reviewed, Patient's Cardiovascular Status Stable, Respiratory Function Stable, Patent Airway and No signs of Nausea or vomiting  Post-op Vital Signs: stable  Complications: No apparent anesthesia complications 

## 2011-03-03 ENCOUNTER — Encounter: Payer: Self-pay | Admitting: Internal Medicine

## 2011-03-03 ENCOUNTER — Ambulatory Visit (INDEPENDENT_AMBULATORY_CARE_PROVIDER_SITE_OTHER): Payer: Medicare Other | Admitting: Internal Medicine

## 2011-03-03 VITALS — BP 124/70 | HR 68 | Ht 62.0 in | Wt 149.6 lb

## 2011-03-03 DIAGNOSIS — J309 Allergic rhinitis, unspecified: Secondary | ICD-10-CM

## 2011-03-03 DIAGNOSIS — J45909 Unspecified asthma, uncomplicated: Secondary | ICD-10-CM

## 2011-03-03 MED ORDER — METHYLPREDNISOLONE ACETATE 80 MG/ML IJ SUSP
80.0000 mg | Freq: Once | INTRAMUSCULAR | Status: AC
  Administered 2011-03-03: 80 mg via INTRAMUSCULAR

## 2011-03-03 MED ORDER — LEVALBUTEROL HCL 0.63 MG/3ML IN NEBU
0.6300 mg | INHALATION_SOLUTION | Freq: Once | RESPIRATORY_TRACT | Status: AC
  Administered 2011-03-03: 0.63 mg via RESPIRATORY_TRACT

## 2011-03-03 MED ORDER — ALBUTEROL SULFATE HFA 108 (90 BASE) MCG/ACT IN AERS: 2.0000 | INHALATION_SPRAY | Freq: Four times a day (QID) | RESPIRATORY_TRACT | Status: DC | PRN

## 2011-03-03 MED ORDER — PREDNISONE 10 MG PO TABS: ORAL_TABLET | ORAL | Status: DC

## 2011-03-03 NOTE — Progress Notes (Signed)
Patient ID: [name], male    DOB: 10-05-1930, 77 y.o.   MRN: [id number]  HPI 11/16/10- 38 year old man followed for severe chronic asthma, sleep apnea, allergic rhinitis, complicated by HBP CAD history of atrial fibrillation, GERD Last here May 20, 2010 CPAP 10 all night every night- helps sleep. Asks refill Provigil to help with long drives.  Allergy vaccine GO definitely helps- he is convinced.  He notes no asthma and no prednisone since last November- credits frequent handwashing, avoiding people with colds and the allergy vaccine. No new concerns. Continues cardiac rehab.   03/03/11-  49 year old man followed for severe chronic asthma, sleep apnea, allergic rhinitis, complicated by HBP CAD history of atrial fibrillation, GERD. Wife here. He recently had his fourth hospital stay since March, for gallbladder pain, cholecystectomy, cardiac stent. Cholecystectomy was 2 weeks ago. Through these he had shortness of breath and cough which has persisted and keep him awake. His nose runs.  Review of Systems Constitutional:   No-   weight loss, night sweats, fevers, chills, fatigue, lassitude. HEENT:   No-  headaches, difficulty swallowing, tooth/dental problems, sore throat,       No-  sneezing, itching, ear ache, nasal congestion, +post nasal drip,  CV:  No-   chest pain, orthopnea, PND, swelling in lower extremities, anasarca, dizziness, palpitations Resp: +  shortness of breath with exertion or at rest.              No-   productive cough,  No non-productive cough,  No-  coughing up of blood.              No-   change in color of mucus.  No- wheezing.   Skin: No-   rash or lesions. GI:  No-   heartburn, indigestion, abdominal pain, nausea, vomiting, diarrhea,                 change in bowel habits, loss of appetite GU: No-   dysuria, change in color of urine, no urgency or frequency.  No- flank pain. MS:  No-   joint pain or swelling.  No- decreased range of motion.  No- back  pain. Neuro- grossly normal to observation, Or:  Psych:  No- change in mood or affect. No depression or anxiety.  No memory loss.  Objective:   Physical Exam General- Alert, Oriented, Affect-appropriate, Distress- none acute Skin- rash-none, lesions- none, excoriation- none   [place] t[place] Lymphadenopathy- none Head- atraumatic            Eyes- Gross vision intact, PERRLA, conjunctivae clear secretions            Ears- Hearing, canals- hearing aid            Nose- Clear, No- Septal dev, mucus, polyps, erosion, perforation             Throat- Mallampati II , mucosa clear , drainage- none, tonsils- atrophic Neck- flexible , trachea midline, no stridor , thyroid nl, carotid no bruit Chest - symmetrical excursion , unlabored           Heart/CV- RRR , no murmur , no gallop  , no rub, nl s1 s2                           - JVD- none , edema- none, stasis changes- none, varices- none           Lung- unlabored bilateral wheeze, cough- none ,  dullness-none, rub- none           Chest wall-  Abd- tender-no, distended-no, bowel sounds-present, HSM- no Br/ Gen/ Rectal- Not done, not indicated Extrem- cyanosis- none, clubbing, none, atrophy- none, strength- nl Neuro- grossly intact to observation

## 2011-03-03 NOTE — Patient Instructions (Addendum)
Neb xop 0.63  Depo 80   Script for Prednisone to use if needed

## 2011-03-05 NOTE — Assessment & Plan Note (Signed)
Persistent mild bronchitis, mostly reflecting his repeated hospital visits for other problems, with fatigue. Plan-nebulizer with Xopenex, Depo-Medrol. His rescue inhaler had come off of his medication list and was added back with discussion. He wants to resume allergy vaccine now that he is out of the hospital. We discussed and agreed.

## 2011-03-05 NOTE — Assessment & Plan Note (Signed)
He is resuming allergy shots now that he is out of the hospital. We discussed this.

## 2011-03-07 ENCOUNTER — Ambulatory Visit (INDEPENDENT_AMBULATORY_CARE_PROVIDER_SITE_OTHER): Payer: Medicare Other | Admitting: Radiology

## 2011-03-07 ENCOUNTER — Encounter (INDEPENDENT_AMBULATORY_CARE_PROVIDER_SITE_OTHER): Payer: Self-pay | Admitting: Radiology

## 2011-03-07 VITALS — BP 126/60 | HR 64 | Temp 97.9°F | Ht 62.0 in | Wt 147.4 lb

## 2011-03-07 DIAGNOSIS — K851 Biliary acute pancreatitis without necrosis or infection: Secondary | ICD-10-CM

## 2011-03-07 DIAGNOSIS — K859 Acute pancreatitis without necrosis or infection, unspecified: Secondary | ICD-10-CM

## 2011-03-07 NOTE — Progress Notes (Signed)
SEGUNDO MAKELA 05-19-1930 [id number] 03/07/2011   [name] is a 77 y.o. male who had a laparoscopic cholecystectomy with intraoperative cholangiogram.  The pathology report confirmed cholecystitis. He is well recovered from his biliary pancreatitis. The patient reports that they are feeling well with normal bowel movements and good appetite.  The pre-operative symptoms of abdominal pain, nausea, and vomiting have resolved.    Physical examination - Incisions appear well-healed with no sign of infection or bleeding.   Abdomen - soft, non-tender  Impression:  s/p laparoscopic cholecystectomy  Plan:  He may resume a regular diet and full activity.  He may follow-up on a PRN basis.

## 2011-03-24 ENCOUNTER — Encounter: Payer: Self-pay | Admitting: Cardiology

## 2011-03-24 ENCOUNTER — Ambulatory Visit (INDEPENDENT_AMBULATORY_CARE_PROVIDER_SITE_OTHER): Payer: Medicare Other | Admitting: Cardiology

## 2011-03-24 DIAGNOSIS — I2510 Atherosclerotic heart disease of native coronary artery without angina pectoris: Secondary | ICD-10-CM

## 2011-03-24 DIAGNOSIS — I10 Essential (primary) hypertension: Secondary | ICD-10-CM

## 2011-03-24 DIAGNOSIS — E785 Hyperlipidemia, unspecified: Secondary | ICD-10-CM

## 2011-03-24 DIAGNOSIS — I4891 Unspecified atrial fibrillation: Secondary | ICD-10-CM

## 2011-03-24 NOTE — Assessment & Plan Note (Signed)
He had this in the context of acute illness in the past.  No further testing is indicated.

## 2011-03-24 NOTE — Progress Notes (Signed)
HPI The patient presents for followup after PCI of the vein graft earlier this year.  He is not s/p cholecystectomy. He did well with this and we did see him in consultation. He had no cardiac complications. The patient denies any new symptoms such as chest discomfort, neck or arm discomfort. There has been no new shortness of breath, PND or orthopnea. There have been no reported palpitations, presyncope or syncope.  He is working out at [employer] three times per week now.  Allergies  Allergen Reactions  . Codeine Nausea And Vomiting    Current Outpatient Prescriptions  Medication Sig Dispense Refill  . albuterol (PROAIR HFA) 108 (90 BASE) MCG/ACT inhaler Inhale 2 puffs into the lungs every 6 (six) hours as needed for wheezing or shortness of breath.  1 Inhaler  prn  . albuterol (PROVENTIL) (2.5 MG/3ML) 0.083% nebulizer solution Take 3 mLs (2.5 mg total) by nebulization 2 (two) times daily. DX:  493.90  180 mL  5  . amLODipine (NORVASC) 5 MG tablet take 1 tablet by mouth twice a day  60 tablet  5  . aspirin 81 MG tablet Take 81 mg by mouth daily.       . bisoprolol (ZEBETA) 10 MG tablet Take 1 tablet (10 mg total) by mouth daily.  30 tablet  11  . budesonide (PULMICORT) 0.25 MG/2ML nebulizer solution Take 2 mLs (0.25 mg total) by nebulization 2 (two) times daily.  120 mL  11  . cetirizine (ZYRTEC) 10 MG tablet Take 10 mg by mouth daily.        . cloNIDine (CATAPRES) 0.3 MG tablet        . clopidogrel (PLAVIX) 75 MG tablet Take 1 tablet (75 mg total) by mouth daily.  30 tablet  11  . latanoprost (XALATAN) 0.005 % ophthalmic solution Place 1 drop into both eyes daily.        . nitroGLYCERIN (NITROSTAT) 0.4 MG SL tablet Place 0.4 mg under the tongue every 5 (five) minutes as needed. For chest pain      . omeprazole (PRILOSEC) 20 MG capsule Take 20 mg by mouth daily.        . potassium chloride SA (K-DUR,KLOR-CON) 20 MEQ tablet Take 1 tablet (20 mEq total) by mouth 2 (two) times daily.  60  tablet  11  . pravastatin (PRAVACHOL) 80 MG tablet take 1 tablet by mouth every evening  30 tablet  7  . Probiotic Product (PHILLIPS COLON HEALTH PO) Take 1 tablet by mouth daily.         Past Medical History  Diagnosis Date  . CORONARY HEART DISEASE     a. s/p CABG;  b.  cath 06/27/10: S-Dx occluded, S-PDA 80-90% (tx with PCI); S-OM ok, L-LAD ok;  EF 65-70%  c.  s/p Promus DES to S-PDA 06/2010;   d. Myoview 8/12: low risk  . FIBRILLATION, ATRIAL     post op; ?documented during hosp. 06/2010  . SLEEP APNEA 09/2001    NPSG AHI 22/HR  . ALLERGIC RHINITIS   . ASTHMA   . Depression   . GERD   . DYSLIPIDEMIA   . HYPERTENSION     Echo 3/12: EF 55-60%; mod LVH; mild AS/AI; LAE; PASP 38; mild pulmo HTN  . Personal history of alcoholism   . Esophageal stricture   . Hiatal hernia   . Diverticulosis   . Benign liver cyst   . Skin cancer     L forearm  .  Cataract     surgery to both eyes    Past Surgical History  Procedure Date  . Coronary artery bypass graft 02/2007  . Hernia repair 12/03/07  . Tonsillectomy   . Coronary angioplasty with stent placement 07/2010  . Cataract extraction, bilateral 2007  . Hernia repair unsure ?60's  . Cholecystectomy 02/21/2011    Procedure: LAPAROSCOPIC CHOLECYSTECTOMY WITH INTRAOPERATIVE CHOLANGIOGRAM;  Surgeon: Valarie Merino, MD;  Location: [facility] ORS;  Service: General;  Laterality: N/A;    ROS:  As stated in the HPI and negative for all other systems.  PHYSICAL EXAM BP 140/67  Pulse 71  Resp 20  Ht 5\' 2"  (1.575 m)  Wt 146 lb (66.225 kg)  BMI 26.70 kg/m2 GENERAL:  Well appearing HEENT:  Pupils equal round and reactive, fundi not visualized, oral mucosa unremarkable, dentures NECK:  No jugular venous distention, waveform within normal limits, carotid upstroke brisk and symmetric, left carotid bruits, no thyromegaly LYMPHATICS:  No cervical, inguinal adenopathy LUNGS:  Clear to auscultation bilaterally BACK:  No CVA tenderness CHEST:  Well  healed sternotomy scar. HEART:  PMI not displaced or sustained,S1 and S2 within normal limits, no S3, no S4, no clicks, no rubs, soft apical, right upper sterna border early peaking murmur ABD:  Flat, positive bowel sounds normal in frequency in pitch, no bruits, no rebound, no guarding, no midline pulsatile mass, no hepatomegaly, no splenomegaly, healed abdominal scars. EXT:  2 plus pulses throughout, mild bilateral edema, no cyanosis no clubbing SKIN:  No rashes no nodules NEURO:  Cranial nerves II through XII grossly intact, motor grossly intact throughout PSYCH:  Cognitively intact, oriented to person place and time  Lab Results  Component Value Date   CHOL 114 12/17/2010   HDL 35* 12/17/2010   LDLCALC 59 12/17/2010   TRIG 99 12/17/2010   CHOLHDL 3.3 12/17/2010    ASSESSMENT AND PLAN

## 2011-03-24 NOTE — Assessment & Plan Note (Signed)
I reviewed his lipids and they were excellent. No change in therapy is indicated.

## 2011-03-24 NOTE — Assessment & Plan Note (Signed)
The blood pressure is at target. No change in medications is indicated. We will continue with therapeutic lifestyle changes (TLC).  

## 2011-03-24 NOTE — Assessment & Plan Note (Signed)
The patient has no new sypmtoms.  No further cardiovascular testing is indicated.  We will continue with aggressive risk reduction and meds as listed.  

## 2011-03-31 ENCOUNTER — Ambulatory Visit (INDEPENDENT_AMBULATORY_CARE_PROVIDER_SITE_OTHER): Payer: Medicare Other

## 2011-03-31 DIAGNOSIS — J309 Allergic rhinitis, unspecified: Secondary | ICD-10-CM

## 2011-04-12 ENCOUNTER — Telehealth: Payer: Self-pay | Admitting: Internal Medicine

## 2011-04-12 NOTE — Telephone Encounter (Signed)
Modafinil PA initiated through [employer] at [phone]. Member ID # [id number]. Case # [id number] under clinical review. Our office should receive a response within 72 hours. Will forward msg to Katie to await response. Forms placed in triage.

## 2011-04-13 ENCOUNTER — Other Ambulatory Visit: Payer: Self-pay | Admitting: Dermatology

## 2011-04-14 NOTE — Telephone Encounter (Signed)
PA form placed on CDY cart.

## 2011-04-17 NOTE — Telephone Encounter (Signed)
PA denied by insurance for Provigil-message and denial information placed on CY's cart for advice.

## 2011-04-17 NOTE — Telephone Encounter (Signed)
I spoke with pt and made him aware of the denial. Pt states he is going to pay for this out of pocket.

## 2011-04-17 NOTE — Telephone Encounter (Signed)
Per CY-let patient know that [employer] will not approve patient to have Provigil; he can either pay for medication out of pocket or stop all together.

## 2011-05-01 ENCOUNTER — Other Ambulatory Visit: Payer: Self-pay | Admitting: Internal Medicine

## 2011-05-04 ENCOUNTER — Other Ambulatory Visit: Payer: Self-pay | Admitting: Dermatology

## 2011-05-19 ENCOUNTER — Ambulatory Visit (INDEPENDENT_AMBULATORY_CARE_PROVIDER_SITE_OTHER): Payer: Medicare Other | Admitting: Family Medicine

## 2011-05-19 ENCOUNTER — Ambulatory Visit: Payer: Medicare Other

## 2011-05-19 VITALS — BP 136/72 | HR 86 | Temp 98.0°F | Resp 18 | Ht 63.5 in | Wt 155.0 lb

## 2011-05-19 DIAGNOSIS — J45901 Unspecified asthma with (acute) exacerbation: Secondary | ICD-10-CM

## 2011-05-19 DIAGNOSIS — R0600 Dyspnea, unspecified: Secondary | ICD-10-CM

## 2011-05-19 DIAGNOSIS — J45909 Unspecified asthma, uncomplicated: Secondary | ICD-10-CM

## 2011-05-19 DIAGNOSIS — R0609 Other forms of dyspnea: Secondary | ICD-10-CM

## 2011-05-19 MED ORDER — PREDNISONE 20 MG PO TABS: ORAL_TABLET | ORAL | Status: DC

## 2011-05-19 MED ORDER — ALBUTEROL SULFATE (5 MG/ML) 0.5% IN NEBU
2.5000 mg | INHALATION_SOLUTION | Freq: Once | RESPIRATORY_TRACT | Status: AC
  Administered 2011-05-19: 2.5 mg via RESPIRATORY_TRACT

## 2011-05-19 MED ORDER — METHYLPREDNISOLONE SODIUM SUCC 125 MG IJ SOLR
125.0000 mg | Freq: Once | INTRAMUSCULAR | Status: AC
  Administered 2011-05-19: 125 mg via INTRAMUSCULAR

## 2011-05-19 MED ORDER — IPRATROPIUM BROMIDE 0.02 % IN SOLN
0.5000 mg | Freq: Once | RESPIRATORY_TRACT | Status: AC
  Administered 2011-05-19: 0.5 mg via RESPIRATORY_TRACT

## 2011-05-19 NOTE — Patient Instructions (Signed)
Patient was instructed to return if worse. He is to take prednisone, but was not given any antibiotics today unless he starts bleeding or purulent phlegm.

## 2011-05-19 NOTE — Progress Notes (Signed)
  Subjective:    Patient ID: [name]., male    DOB: Oct 18, 1930, 78 y.o.   MRN: [id number]  HPI patient presents with a history of having asthma. Sees Dr. Maple Hudson for his lungs. He was noted to that if he cannot get the wheezing to stop on outpatient basis he is to be seen by a doctor. Days ago he started wheezing nurse, is gotten gradually worse over the last 3 days. He has used his home medication including the nebulizer. Despite this he is doing well. He is not coughing up any phlegm. He has not been running a fever. He is just getting short of breath.  A while since he's had in the hospital for this. His last hospitalization was in November when he had his gallbladder out.   Review of Systems no upper respiratory symptoms no chest pains. No palpitations. No GI symptoms. He has not been running a fever.    peak flow done after the nebulizer was 300. Objective:   Physical Exam Elderly gentleman who is having some breathing discomfort. He is audibly short of breath when he tries to space. Throat clear wears hearing aids bilaterally neck supple no obvious JVD. Mildly barrel chested. Heart regular rate and rhythm. aboen soft withut masses or tenderness. His chest does have significant wheezing primarily on expiration. He has a very prolonged expiratory phase. Ankle edema slightly more on the left than on the right, 2+.  [facility] reading (PRIMARY) by  Dr. Alwyn Ren Chest x-ray shows old thoracotomy wires. Lungs appear to have some COPD but no acute changes.  Peak flow was done after the nebulizer and it was 250.    Assessment & Plan:  Asthma and dyspnea  With nebulizer of albuterol and Atrovent. Solu-Medrol 125 mg IM. Check a chest x-ray. We'll plan to send home on his regular medicines plus oral prednisone. I do not believe that antibiotics are indicated unless there is an infiltrate on the chest x-ray.

## 2011-05-24 ENCOUNTER — Ambulatory Visit: Payer: Medicare Other | Admitting: Internal Medicine

## 2011-06-03 ENCOUNTER — Ambulatory Visit: Payer: Medicare Other

## 2011-06-03 ENCOUNTER — Ambulatory Visit (INDEPENDENT_AMBULATORY_CARE_PROVIDER_SITE_OTHER): Payer: Medicare Other | Admitting: Family Medicine

## 2011-06-03 ENCOUNTER — Encounter: Payer: Self-pay | Admitting: Physician Assistant

## 2011-06-03 DIAGNOSIS — R062 Wheezing: Secondary | ICD-10-CM

## 2011-06-03 DIAGNOSIS — J45909 Unspecified asthma, uncomplicated: Secondary | ICD-10-CM

## 2011-06-03 DIAGNOSIS — R0602 Shortness of breath: Secondary | ICD-10-CM

## 2011-06-03 LAB — POCT CBC
MCH, POC: 27.4 pg (ref 27–31.2)
MCV: 86.1 fL (ref 80–97)
MID (cbc): 0.7 (ref 0–0.9)
POC LYMPH PERCENT: 23.6 %L (ref 10–50)
Platelet Count, POC: 207 10*3/uL (ref 142–424)
RBC: 4.45 M/uL — AB (ref 4.69–6.13)
RDW, POC: 15.9 %
WBC: 7.7 10*3/uL (ref 4.6–10.2)

## 2011-06-03 MED ORDER — IPRATROPIUM-ALBUTEROL 0.5-2.5 (3) MG/3ML IN SOLN: 3.0000 mL | Freq: Two times a day (BID) | RESPIRATORY_TRACT | Status: DC

## 2011-06-03 MED ORDER — ALBUTEROL SULFATE (2.5 MG/3ML) 0.083% IN NEBU
2.5000 mg | INHALATION_SOLUTION | Freq: Once | RESPIRATORY_TRACT | Status: AC
  Administered 2011-06-03: 2.5 mg via RESPIRATORY_TRACT

## 2011-06-03 MED ORDER — IPRATROPIUM-ALBUTEROL 0.5-2.5 (3) MG/3ML IN SOLN: 3.0000 mL | Freq: Three times a day (TID) | RESPIRATORY_TRACT | Status: DC

## 2011-06-03 MED ORDER — IPRATROPIUM BROMIDE 0.02 % IN SOLN
0.5000 mg | Freq: Once | RESPIRATORY_TRACT | Status: AC
  Administered 2011-06-03: 0.5 mg via RESPIRATORY_TRACT

## 2011-06-03 MED ORDER — PREDNISONE 10 MG PO TABS: ORAL_TABLET | ORAL | Status: DC

## 2011-06-03 MED ORDER — TIOTROPIUM BROMIDE MONOHYDRATE 18 MCG IN CAPS: 18.0000 ug | ORAL_CAPSULE | Freq: Every day | RESPIRATORY_TRACT | Status: DC

## 2011-06-03 NOTE — Progress Notes (Signed)
  Subjective:    Patient ID: [name]., male    DOB: [birth year], 78 y.o.   MRN: [id number]  HPI Pt presents to clinic with 2 d h/o SOB with increased wheezing and trouble with his asthma.  He has known severe lung disease and sees Dr. Maple Hudson (next appt 3/13).  He has had some cold symptoms but cough is just with white sputum which is normal for him.  His concern is that he is SOB and albuterol nebs are not helping his SOB except for about 2h after treatment.  He has had no fever or problems with congestion.  He states he typically needs prednisone when he feels like this.  2 years ago he had 8-10 episodes that required prednisone and last yers only 3-4.  We last saw in 2/9 with similar symptoms.   Review of Systems  Constitutional: Negative for fever and chills.  HENT: Negative for congestion and sinus pressure.   Respiratory: Positive for cough, chest tightness, shortness of breath and wheezing.        Objective:   Physical Exam  Constitutional: He is oriented to person, place, and time. He appears well-developed and well-nourished.  HENT:  Head: Normocephalic and atraumatic.  Right Ear: External ear normal.  Left Ear: External ear normal.  Mouth/Throat: Oropharynx is clear and moist. No oropharyngeal exudate.  Cardiovascular: Normal rate, regular rhythm and normal heart sounds.   No murmur heard. Pulmonary/Chest: He is in respiratory distress (improved after neb slightly). He has no decreased breath sounds. He has wheezes (all lung fields - worse on the L - slight improvement after neb). He has no rhonchi. He has no rales. He exhibits no tenderness.  Lymphadenopathy:    He has no cervical adenopathy.  Neurological: He is alert and oriented to person, place, and time.  Skin: Skin is warm and dry.  Psychiatric: He has a normal mood and affect. His behavior is normal. Judgment and thought content normal.    [facility] reading (PRIMARY) by  Dr. Alwyn Ren - changes consistent with COPD no  signs of infiltrate.       Assessment & Plan:   1. Asthma  POCT CBC, DG Chest 2 View, albuterol (PROVENTIL) (2.5 MG/3ML) 0.083% nebulizer solution 2.5 mg, ipratropium (ATROVENT) nebulizer solution 0.5 mg, predniSONE (DELTASONE) 10 MG tablet, ipratropium-albuterol (DUONEB) 0.5-2.5 (3) MG/3ML SOLN, DISCONTINUED: tiotropium (SPIRIVA HANDIHALER) 18 MCG inhalation capsule, DISCONTINUED: ipratropium-albuterol (DUONEB) 0.5-2.5 (3) MG/3ML SOLN  2. SOB (shortness of breath)  POCT CBC, DG Chest 2 View, albuterol (PROVENTIL) (2.5 MG/3ML) 0.083% nebulizer solution 2.5 mg, ipratropium (ATROVENT) nebulizer solution 0.5 mg, predniSONE (DELTASONE) 10 MG tablet, ipratropium-albuterol (DUONEB) 0.5-2.5 (3) MG/3ML SOLN  3. Wheezing  POCT CBC, DG Chest 2 View, albuterol (PROVENTIL) (2.5 MG/3ML) 0.083% nebulizer solution 2.5 mg, ipratropium (ATROVENT) nebulizer solution 0.5 mg, predniSONE (DELTASONE) 10 MG tablet, ipratropium-albuterol (DUONEB) 0.5-2.5 (3) MG/3ML SOLN   Changed pt meds slightly to see if can get better control and decrease need for prednisone.  Wanted to try Spirva but per pt to expensive so will try duoneb tid.  D/w Dr. Alwyn Ren.  Pt to f/u with Dr. Maple Hudson as scheduled.  Pt to f/u in 2-3d if no improvement.

## 2011-06-14 ENCOUNTER — Encounter: Payer: Self-pay | Admitting: Internal Medicine

## 2011-06-14 ENCOUNTER — Ambulatory Visit (INDEPENDENT_AMBULATORY_CARE_PROVIDER_SITE_OTHER): Payer: Medicare Other | Admitting: Internal Medicine

## 2011-06-14 VITALS — BP 120/82 | HR 79 | Temp 98.4°F | Ht 63.5 in | Wt 156.6 lb

## 2011-06-14 DIAGNOSIS — E785 Hyperlipidemia, unspecified: Secondary | ICD-10-CM

## 2011-06-14 DIAGNOSIS — I10 Essential (primary) hypertension: Secondary | ICD-10-CM

## 2011-06-14 DIAGNOSIS — J45901 Unspecified asthma with (acute) exacerbation: Secondary | ICD-10-CM

## 2011-06-14 DIAGNOSIS — K859 Acute pancreatitis without necrosis or infection, unspecified: Secondary | ICD-10-CM

## 2011-06-14 MED ORDER — PREDNISONE 10 MG PO TABS: 10.0000 mg | ORAL_TABLET | Freq: Every day | ORAL | Status: DC

## 2011-06-14 MED ORDER — METHYLPREDNISOLONE ACETATE 80 MG/ML IJ SUSP
120.0000 mg | Freq: Once | INTRAMUSCULAR | Status: AC
  Administered 2011-06-14: 120 mg via INTRAMUSCULAR

## 2011-06-14 NOTE — Patient Instructions (Signed)
It was good to see you today. We have reviewed your interval medical records including pancreatitis and gallbladder surgery, labs and tests today - also visits to urgent care for asthma Medrol shot and nebulizer given to you in office today. Continue prednisone 10 mg daily until further followup and instruction from Dr. Maple Hudson Other medications reviewed, no recommended changes. Your prescription(s) have been submitted to your pharmacy. Please take as directed and contact our office if you believe you are having problem(s) with the medication(s). Please reschedule followup in 4 months, call sooner if problems.

## 2011-06-14 NOTE — Assessment & Plan Note (Signed)
The current medical regimen is effective;  continue present plan and medications. BP Readings from Last 3 Encounters:  06/14/11 120/82  05/19/11 136/72  03/24/11 140/67

## 2011-06-14 NOTE — Assessment & Plan Note (Signed)
Acute biliary pancreatitis November 2012, status post laparoscopic cholecystectomy Resolution of all symptoms. History reviewed with patient today.

## 2011-06-14 NOTE — Progress Notes (Signed)
Subjective:    Patient ID: [name]., male    DOB: 1930-10-07, 78 y.o.   MRN: [id number]  HPI  here for follow up-  reviewed chronic medical issues today:  Severe COPD, chronic asthma, remote tobacco - follows with pulm for same - frequent flares related to weather change, 2 visits to urg care in past 2 months for same, complains of "brown tan" sputum, not green. No fever. reports compliance with ongoing nebulizer treatment (steroid and Alb) but unable to afford Spiriva or Daliresp. reviewed change in medication to Duonebs, does not feel improved but denies adverse side effects related to current therapy.   CAD s/p CABG 2008, DES to saph v graft 06/2010 due to abn stress test - follows with cards for same - reports compliance with ongoing medical treatment and no changes in medication dose or frequency. denies adverse side effects related to current therapy. no CP or anginal symptoms   HTN - reports compliance with ongoing medical treatment and no changes in medication dose or frequency. denies adverse side effects related to current therapy. no edema or HA or weakness  dyslipidemia - changed from simva to prava 6/2011due to FDA warnings - reports compliance with ongoing medical treatment and no other changes in medication dose or frequency. denies adverse side effects related to current therapy. no muscle pain or weakness  Also episode of biliary pancreatitis November 2012, followed by laparoscopic cholecystectomy 02/21/11 -   Past Medical History  Diagnosis Date  . CORONARY HEART DISEASE     a. s/p CABG;  b.  cath 06/27/10: S-Dx occluded, S-PDA 80-90% (tx with PCI); S-OM ok, L-LAD ok;  EF 65-70%  c.  s/p Promus DES to S-PDA 06/2010;   d. Myoview 8/12: low risk  . FIBRILLATION, ATRIAL     post op; ?documented during hosp. 06/2010  . SLEEP APNEA 09/2001    NPSG AHI 22/HR  . ALLERGIC RHINITIS   . ASTHMA   . Depression   . GERD     with HH, hx esophageal stricture  . DYSLIPIDEMIA     . HYPERTENSION     Echo 3/12: EF 55-60%; mod LVH; mild AS/AI; LAE; PASP 38; mild pulmo HTN  . Personal history of alcoholism   . Diverticulosis   . Benign liver cyst   . Skin cancer     L forearm  . Cataract     surgery to both eyes    Review of Systems  Constitutional: Negative for fever and unexpected weight change.  Respiratory: Positive for cough and shortness of breath.   Cardiovascular: Negative for chest pain and leg swelling.  Neurological: Negative for dizziness, weakness, light-headedness, numbness and headaches.      Objective:   Physical Exam  BP 120/82  Pulse 79  Temp(Src) 98.4 F (36.9 C) (Oral)  Ht 5' 3.5" (1.613 m)  Wt 156 lb 9.6 oz (71.033 kg)  BMI 27.31 kg/m2  SpO2 96% Wt Readings from Last 3 Encounters:  06/14/11 156 lb 9.6 oz (71.033 kg)  05/19/11 155 lb (70.308 kg)  03/24/11 146 lb (66.225 kg)   Constitutional:  He appears well-developed and well-nourished. Wheezing at rest but conversational without distress.  Cardiovascular: Normal rate, irregular rhythm and normal heart sounds.  No murmur heard. No BLE edema Pulmonary/Chest: Effort slightly increased at rest, decreased base breath sounds  With expiratory wheezes. Also  upper vocal cord dysfunction and pseudo-wheeze. Neurological: he is alert and oriented to person, place, and time. No cranial  nerve deficit. Coordination normal.   Psychiatric: he has a normal mood and affect. behavior is normal. Judgment and thought content normal.     Lab Results  Component Value Date   WBC 7.7 06/03/2011   HGB 12.2* 06/03/2011   HCT 38.3* 06/03/2011   PLT 190 02/23/2011   CHOL 114 12/17/2010   TRIG 99 12/17/2010   HDL 35* 12/17/2010   ALT 39 16/01/9603   AST 15 02/19/2011   NA 140 02/23/2011   K 3.5 02/23/2011   CL 103 02/23/2011   CREATININE 0.94 02/23/2011   BUN 11 02/23/2011   CO2 33* 02/23/2011   TSH 1.212 02/21/2011   PSA 0.62 10/10/2010   INR 1.00 12/16/2010   HGBA1C  Value: 6.0 (NOTE)   The ADA  recommends the following therapeutic goals for glycemic   control related to Hgb A1C measurement:   Goal of Therapy:   < 7.0% Hgb A1C   Action Suggested:  > 8.0% Hgb A1C   Ref:  Diabetes Care, 22, Suppl. 1, 1999 02/26/2007   Assessment & Plan:  See problem list. Medications and labs reviewed today.  Acute asthma exacerbation atop poorly controlled baseline asthma - treatment ongoing bronchospasm with IM Medrol today and prednisone x3 weeks daily until followup with pulmonary as scheduled. Albuterol nebulizer treatment in office today with improvement. Reviewed patient's home medication regimen. Note compliance issue with Spiriva (and Daliresp) due to cost, but appears compliant with nebulized steroids and albuterol.

## 2011-06-14 NOTE — Assessment & Plan Note (Signed)
On high dose prava - tolerating well, LDL<70 at goal The current medical regimen is effective;  continue present plan and medications.  

## 2011-06-23 ENCOUNTER — Other Ambulatory Visit: Payer: Self-pay | Admitting: Cardiology

## 2011-06-23 NOTE — Telephone Encounter (Signed)
Refilled pravastatin. 

## 2011-07-03 ENCOUNTER — Ambulatory Visit (INDEPENDENT_AMBULATORY_CARE_PROVIDER_SITE_OTHER): Payer: Medicare Other | Admitting: Internal Medicine

## 2011-07-03 ENCOUNTER — Encounter: Payer: Self-pay | Admitting: Internal Medicine

## 2011-07-03 DIAGNOSIS — R062 Wheezing: Secondary | ICD-10-CM

## 2011-07-03 DIAGNOSIS — G4733 Obstructive sleep apnea (adult) (pediatric): Secondary | ICD-10-CM

## 2011-07-03 DIAGNOSIS — R0602 Shortness of breath: Secondary | ICD-10-CM

## 2011-07-03 DIAGNOSIS — K219 Gastro-esophageal reflux disease without esophagitis: Secondary | ICD-10-CM

## 2011-07-03 DIAGNOSIS — J45909 Unspecified asthma, uncomplicated: Secondary | ICD-10-CM

## 2011-07-03 MED ORDER — IPRATROPIUM-ALBUTEROL 0.5-2.5 (3) MG/3ML IN SOLN: 3.0000 mL | Freq: Four times a day (QID) | RESPIRATORY_TRACT | Status: DC | PRN

## 2011-07-03 MED ORDER — FORMOTEROL FUMARATE 20 MCG/2ML IN NEBU
20.0000 ug | INHALATION_SOLUTION | Freq: Once | RESPIRATORY_TRACT | Status: AC
  Administered 2011-07-03: 20 ug via RESPIRATORY_TRACT

## 2011-07-03 MED ORDER — PREDNISONE 10 MG PO TABS: ORAL_TABLET | ORAL | Status: DC

## 2011-07-03 NOTE — Patient Instructions (Addendum)
Sent script for prednisone taper to use now, and to have refills to restart if needed  Increase your omeprazole/ Prilosec to twice daily before meals  Script for ipratropium/ albuterol neb solution for you to give to [employer] here today- Brovana or Perforomist  Script to reset CPAP to 12

## 2011-07-03 NOTE — Progress Notes (Signed)
Patient ID: [name], male    DOB: 01-Dec-1930, 78 y.o.   MRN: [id number]  HPI 11/16/10- 78 year old man followed for severe chronic asthma, sleep apnea, allergic rhinitis, complicated by HBP CAD history of atrial fibrillation, GERD Last here May 20, 2010 CPAP 10 all night every night- helps sleep. Asks refill Provigil to help with long drives.  Allergy vaccine GO definitely helps- he is convinced.  He notes no asthma and no prednisone since last November- credits frequent handwashing, avoiding people with colds and the allergy vaccine. No new concerns. Continues cardiac rehab.   03/03/11-  78 year old man followed for severe chronic asthma, sleep apnea, allergic rhinitis, complicated by HBP CAD history of atrial fibrillation, GERD. Wife here. He recently had his fourth hospital stay since March, for gallbladder pain, cholecystectomy, cardiac stent. Cholecystectomy was 2 weeks ago. Through these he had shortness of breath and cough which has persisted and keep him awake. His nose runs.  07/03/11-  78 year old man followed for severe chronic asthma, sleep apnea, allergic rhinitis, complicated by HBP, CAD, history of atrial fibrillation, GERD. Wife here. Since last here has been to urgent care in emergency room 4 times for exacerbations of asthma. On prednisone 10 mg/day since February from Dr Felicity Coyer. Definitely having reflux events and we discussed how that may be the significant issue. Denies sinus infection. Had chest x-rays in January and February which she was told were negative. He asks to try increasing his CPAP pressure because he doesn't feel he is sleeping quite well enough. We discussed this.  Review of Systems-see HPI Constitutional:   No-   weight loss, night sweats, fevers, chills, fatigue, lassitude. HEENT:   No-  headaches, difficulty swallowing, tooth/dental problems, sore throat,       No-  sneezing, itching, ear ache, nasal congestion, +post nasal drip,  CV:  No-    chest pain, orthopnea, PND, swelling in lower extremities, anasarca, dizziness, palpitations Resp: +  shortness of breath with exertion or at rest.              No-   productive cough,  +non-productive cough,  No-  coughing up of blood.              No-   change in color of mucus. +wheezing.   Skin: No-   rash or lesions. GI:  No-   heartburn, indigestion, abdominal pain, nausea, vomiting, GU: . MS:  No-   joint pain or swelling.   Neuro- nothing unusual:  Psych:  No- change in mood or affect. No depression or anxiety.  No memory loss.  Objective:   Physical Exam General- Alert, Oriented, Affect-appropriate, Distress- none acute Skin- rash-none, lesions- none, excoriation- none   Old tatoos Lymphadenopathy- none Head- atraumatic            Eyes- Gross vision intact, PERRLA, conjunctivae clear secretions            Ears- Hearing, canals- hearing aid            Nose- Clear, No- Septal dev, mucus, polyps, erosion, perforation             Throat- Mallampati II , mucosa clear , drainage- none, tonsils- atrophic Neck- flexible , trachea midline, no stridor , thyroid nl, carotid no bruit Chest - symmetrical excursion , unlabored           Heart/CV- RRR , no murmur , no gallop  , no rub, nl s1 s2                           -  JVD- none , edema- none, stasis changes- none, varices- none           Lung- +unlabored bilateral wheeze, cough- none , dullness-none, rub- none. Full sentences.           Chest wall-  Abd-  Br/ Gen/ Rectal- Not done, not indicated Extrem- cyanosis- none, clubbing, none, atrophy- none, strength- nl Neuro- grossly intact to observation

## 2011-07-07 NOTE — Assessment & Plan Note (Signed)
Exacerbation apparently reflects combination of viral infections and reflux. Now on maintenance prednisone which we discussed. We are going to replace this strategy with a refillable prednisone taper and see how he does with that. If treatment today with long acting Brovana. Home nebulizer prescription for ipratropium plus albuterol

## 2011-07-07 NOTE — Assessment & Plan Note (Signed)
Firm reeducation on reflux precautions and recognition. Increase omeprazole to twice daily.

## 2011-07-07 NOTE — Assessment & Plan Note (Signed)
Plan increase CPAP to 12

## 2011-07-14 ENCOUNTER — Ambulatory Visit (INDEPENDENT_AMBULATORY_CARE_PROVIDER_SITE_OTHER): Payer: Medicare Other | Admitting: Emergency Medicine

## 2011-07-14 VITALS — BP 144/79 | HR 89 | Temp 99.0°F | Resp 22 | Ht 61.5 in | Wt 152.0 lb

## 2011-07-14 DIAGNOSIS — I2510 Atherosclerotic heart disease of native coronary artery without angina pectoris: Secondary | ICD-10-CM

## 2011-07-14 DIAGNOSIS — R05 Cough: Secondary | ICD-10-CM

## 2011-07-14 DIAGNOSIS — R0602 Shortness of breath: Secondary | ICD-10-CM

## 2011-07-14 DIAGNOSIS — R0609 Other forms of dyspnea: Secondary | ICD-10-CM

## 2011-07-14 MED ORDER — PREDNISONE 20 MG PO TABS: ORAL_TABLET | ORAL | Status: DC

## 2011-07-14 MED ORDER — PREDNISOLONE SODIUM PHOSPHATE 15 MG/5ML PO SOLN
60.0000 mg | Freq: Once | ORAL | Status: AC
  Administered 2011-07-14: 60 mg via ORAL

## 2011-07-14 MED ORDER — IPRATROPIUM BROMIDE 0.02 % IN SOLN
0.5000 mg | Freq: Once | RESPIRATORY_TRACT | Status: AC
  Administered 2011-07-14: 0.5 mg via RESPIRATORY_TRACT

## 2011-07-14 MED ORDER — ALBUTEROL SULFATE (5 MG/ML) 0.5% IN NEBU
2.5000 mg | INHALATION_SOLUTION | Freq: Once | RESPIRATORY_TRACT | Status: AC
  Administered 2011-07-14: 2.5 mg via RESPIRATORY_TRACT

## 2011-07-14 NOTE — Progress Notes (Signed)
  Subjective:    Patient ID: [name]., male    DOB: [birth year], 78 y.o.   MRN: [id number]  HPI patient here with recurrent episodes of asthma exacerbations. He's been here he has been to his PCP and also didn't Dr. Maple Hudson. He was last seen by Dr. Tacey Heap and started on dose pack. He's been using his nebulizer 3-4 times a day. His wife states he is only able to walk 10 feet without being short of breath. He has been sleeping approximately 12 hours per day not getting out of bed. He denies any chest pain.Marland Kitchen He states he feels this is like his regular asthma exacerbations except he'll not go away and he has never felt this bad before.    Review of Systems     Objective:   Physical Exam physical exam reveals a ruddy complected male who is in acute respiratory distress. His neck is supple chest exam reveals wheezes with tight prolonged respirations bilaterally. Cardiac exam shows regular rate and rhythm        Assessment & Plan:  Patient here with acute asthma exacerbation currently with repeated episodes despite being on prednisone. We'll call Dr. Maple Hudson and see which direction we need to go regarding his treatment. I've gone ahead and put him on O2 and started a dual neb while we evaluate things

## 2011-07-14 NOTE — Patient Instructions (Signed)
Please do your duo nebs every 4 hours. Take the prednisone 60 mg per day until you followup with Dr. Maple Hudson.

## 2011-07-18 ENCOUNTER — Encounter: Payer: Self-pay | Admitting: Internal Medicine

## 2011-07-18 ENCOUNTER — Ambulatory Visit (INDEPENDENT_AMBULATORY_CARE_PROVIDER_SITE_OTHER): Payer: Medicare Other | Admitting: Internal Medicine

## 2011-07-18 DIAGNOSIS — R0602 Shortness of breath: Secondary | ICD-10-CM

## 2011-07-18 DIAGNOSIS — J309 Allergic rhinitis, unspecified: Secondary | ICD-10-CM

## 2011-07-18 DIAGNOSIS — J45909 Unspecified asthma, uncomplicated: Secondary | ICD-10-CM

## 2011-07-18 DIAGNOSIS — G4733 Obstructive sleep apnea (adult) (pediatric): Secondary | ICD-10-CM

## 2011-07-18 DIAGNOSIS — I4891 Unspecified atrial fibrillation: Secondary | ICD-10-CM

## 2011-07-18 MED ORDER — METHYLPREDNISOLONE ACETATE 80 MG/ML IJ SUSP
120.0000 mg | Freq: Once | INTRAMUSCULAR | Status: AC
  Administered 2011-07-18: 120 mg via INTRAMUSCULAR

## 2011-07-18 MED ORDER — LEVALBUTEROL HCL 0.63 MG/3ML IN NEBU
0.6300 mg | INHALATION_SOLUTION | Freq: Once | RESPIRATORY_TRACT | Status: AC
  Administered 2011-07-18: 0.63 mg via RESPIRATORY_TRACT

## 2011-07-18 MED ORDER — FORMOTEROL FUMARATE 20 MCG/2ML IN NEBU
20.0000 ug | INHALATION_SOLUTION | Freq: Once | RESPIRATORY_TRACT | Status: AC
  Administered 2011-07-18: 20 ug via RESPIRATORY_TRACT

## 2011-07-18 NOTE — Progress Notes (Signed)
Patient ID: [name], male    DOB: [birth year], 78 y.o.   MRN: [id number]  HPI 11/16/10- 49 year old man followed for severe chronic asthma, sleep apnea, allergic rhinitis, complicated by HBP CAD history of atrial fibrillation, GERD Last here May 20, 2010 CPAP 10 all night every night- helps sleep. Asks refill Provigil to help with long drives.  Allergy vaccine GO definitely helps- he is convinced.  He notes no asthma and no prednisone since last November- credits frequent handwashing, avoiding people with colds and the allergy vaccine. No new concerns. Continues cardiac rehab.   03/03/11-  53 year old man followed for severe chronic asthma, sleep apnea, allergic rhinitis, complicated by HBP CAD history of atrial fibrillation, GERD. Wife here. He recently had his fourth hospital stay since March, for gallbladder pain, cholecystectomy, cardiac stent. Cholecystectomy was 2 weeks ago. Through these he had shortness of breath and cough which has persisted and keep him awake. His nose runs.  07/03/11- 74 year old man followed for severe chronic asthma, sleep apnea, allergic rhinitis, complicated by HBP, CAD, history of atrial fibrillation, GERD. Wife here. Since last here has been to urgent care in emergency room 4 times for exacerbations of asthma. On prednisone 10 mg/day since February from Dr Felicity Coyer. Definitely having reflux events and we discussed how that may be the significant issue. Denies sinus infection. Had chest x-rays in January and February which she was told were negative. He asks to try increasing his CPAP pressure because he doesn't feel he is sleeping quite well enough. We discussed this.  07/18/11- 57 year old man followed for severe chronic asthma, sleep apnea, allergic rhinitis, complicated by HBP, CAD, history of atrial fibrillation, GERD. Wife here. Seen by Dr Daub-Increased SOB and wheezing; was put on prednisone to help keep patient out of hospital; still not feeling  much better. Has been on prednisone 60 mg daily for past 4 days but still significant shortness of breath with exertion and wheeze. No excess 2 chest x-rays have been clear and EKG "knot heart". Using home nebulizer with DuoNeb 4 times daily plus his rescue inhaler at least twice per day. Nothing seems infected. He has not recognized heartburn although that has been an issue in the past. Denies chest pain, fever, ankle edema or palpitation. Some nasal congestion without much sneezing. Has been indoors without much exposure to pollen.  Review of Systems-see HPI Constitutional:   No-   weight loss, night sweats, fevers, chills, fatigue, lassitude. HEENT:   No-  headaches, difficulty swallowing, tooth/dental problems, sore throat,       No-  sneezing, itching, ear ache, nasal congestion, +post nasal drip,  CV:  No-   chest pain, orthopnea, PND, swelling in lower extremities, anasarca, dizziness, palpitations Resp: +  shortness of breath with exertion or at rest.              No-   productive cough,  +non-productive cough,  No-  coughing up of blood.              No-   change in color of mucus. +wheezing.   Skin: No-   rash or lesions. GI:  No-   heartburn, indigestion, abdominal pain, nausea, vomiting, GU: . MS:  No-   joint pain or swelling.   Neuro- nothing unusual:  Psych:  No- change in mood or affect. No depression or anxiety.  No memory loss.  Objective:   Physical Exam General- Alert, Oriented, Affect-appropriate, Distress- none acute Skin- rash-none, lesions- none, excoriation-  none   Old tatoos Lymphadenopathy- none Head- atraumatic            Eyes- Gross vision intact, PERRLA, conjunctivae clear secretions            Ears- Hearing, canals- hearing aid            Nose- Clear, No- Septal dev, mucus, polyps, erosion, perforation             Throat- Mallampati II , mucosa clear , drainage- none, tonsils- atrophic Neck- flexible , trachea midline, no stridor , thyroid nl, carotid no  bruit Chest - symmetrical excursion , unlabored           Heart/CV- RRR , no murmur , no gallop  , no rub, nl s1 s2                           - JVD- none , edema- none, stasis changes- none, varices- none           Lung- +unlabored difuse wheeze, cough- none , dullness-none, rub- none. Short sentences.           Chest wall-  Abd-  Br/ Gen/ Rectal- Not done, not indicated Extrem- cyanosis- none, clubbing, none, atrophy- none, strength- nl Neuro- grossly intact to observation

## 2011-07-18 NOTE — Patient Instructions (Addendum)
Neb Brovana or Perforomist today Neb Xopenex 0.63 today  Depo 10  If you don't improve then you will need to go to the ER. If they can't clear you, then you will need to be admitted.

## 2011-07-20 ENCOUNTER — Encounter (HOSPITAL_COMMUNITY): Payer: Self-pay | Admitting: Emergency Medicine

## 2011-07-20 ENCOUNTER — Telehealth: Payer: Self-pay | Admitting: Internal Medicine

## 2011-07-20 ENCOUNTER — Emergency Department (HOSPITAL_COMMUNITY): Payer: Medicare Other

## 2011-07-20 ENCOUNTER — Inpatient Hospital Stay (HOSPITAL_COMMUNITY)
Admission: EM | Admit: 2011-07-20 | Discharge: 2011-07-26 | DRG: 203 | Disposition: A | Discharge status: Home / Self Care | Payer: Medicare Other | Attending: Critical Care Medicine | Admitting: Critical Care Medicine

## 2011-07-20 DIAGNOSIS — J302 Other seasonal allergic rhinitis: Secondary | ICD-10-CM | POA: Diagnosis present

## 2011-07-20 DIAGNOSIS — I10 Essential (primary) hypertension: Secondary | ICD-10-CM | POA: Diagnosis present

## 2011-07-20 DIAGNOSIS — E876 Hypokalemia: Secondary | ICD-10-CM | POA: Diagnosis not present

## 2011-07-20 DIAGNOSIS — I2510 Atherosclerotic heart disease of native coronary artery without angina pectoris: Secondary | ICD-10-CM

## 2011-07-20 DIAGNOSIS — J309 Allergic rhinitis, unspecified: Secondary | ICD-10-CM

## 2011-07-20 DIAGNOSIS — K7689 Other specified diseases of liver: Secondary | ICD-10-CM

## 2011-07-20 DIAGNOSIS — Z951 Presence of aortocoronary bypass graft: Secondary | ICD-10-CM

## 2011-07-20 DIAGNOSIS — K859 Acute pancreatitis without necrosis or infection, unspecified: Secondary | ICD-10-CM

## 2011-07-20 DIAGNOSIS — G4730 Sleep apnea, unspecified: Secondary | ICD-10-CM | POA: Diagnosis present

## 2011-07-20 DIAGNOSIS — G4710 Hypersomnia, unspecified: Secondary | ICD-10-CM

## 2011-07-20 DIAGNOSIS — R7309 Other abnormal glucose: Secondary | ICD-10-CM | POA: Diagnosis not present

## 2011-07-20 DIAGNOSIS — K219 Gastro-esophageal reflux disease without esophagitis: Secondary | ICD-10-CM | POA: Diagnosis present

## 2011-07-20 DIAGNOSIS — J449 Chronic obstructive pulmonary disease, unspecified: Secondary | ICD-10-CM | POA: Diagnosis present

## 2011-07-20 DIAGNOSIS — I4891 Unspecified atrial fibrillation: Secondary | ICD-10-CM | POA: Diagnosis present

## 2011-07-20 DIAGNOSIS — F329 Major depressive disorder, single episode, unspecified: Secondary | ICD-10-CM | POA: Diagnosis present

## 2011-07-20 DIAGNOSIS — K222 Esophageal obstruction: Secondary | ICD-10-CM

## 2011-07-20 DIAGNOSIS — F3289 Other specified depressive episodes: Secondary | ICD-10-CM | POA: Diagnosis present

## 2011-07-20 DIAGNOSIS — G4733 Obstructive sleep apnea (adult) (pediatric): Secondary | ICD-10-CM

## 2011-07-20 DIAGNOSIS — T380X5A Adverse effect of glucocorticoids and synthetic analogues, initial encounter: Secondary | ICD-10-CM | POA: Diagnosis not present

## 2011-07-20 DIAGNOSIS — J45902 Unspecified asthma with status asthmaticus: Principal | ICD-10-CM | POA: Diagnosis present

## 2011-07-20 DIAGNOSIS — Z87891 Personal history of nicotine dependence: Secondary | ICD-10-CM

## 2011-07-20 DIAGNOSIS — J4489 Other specified chronic obstructive pulmonary disease: Secondary | ICD-10-CM | POA: Diagnosis present

## 2011-07-20 DIAGNOSIS — E785 Hyperlipidemia, unspecified: Secondary | ICD-10-CM | POA: Diagnosis present

## 2011-07-20 LAB — CBC
MCH: 27.6 pg (ref 26.0–34.0)
MCHC: 32.3 g/dL (ref 30.0–36.0)
MCV: 85.5 fL (ref 78.0–100.0)
Platelets: 220 10*3/uL (ref 150–400)
RBC: 4.56 MIL/uL (ref 4.22–5.81)
RDW: 15.2 % (ref 11.5–15.5)

## 2011-07-20 LAB — GLUCOSE, CAPILLARY: Glucose-Capillary: 127 mg/dL — ABNORMAL HIGH (ref 70–99)

## 2011-07-20 LAB — BASIC METABOLIC PANEL
CO2: 27 mEq/L (ref 19–32)
Calcium: 8.9 mg/dL (ref 8.4–10.5)
Creatinine, Ser: 0.75 mg/dL (ref 0.50–1.35)
GFR calc Af Amer: >90 mL/min (ref >90)
GFR calc non Af Amer: 86 mL/min — ABNORMAL LOW (ref >90)
Sodium: 138 mEq/L (ref 135–145)

## 2011-07-20 LAB — TROPONIN I: Troponin I: <0.3 ng/mL (ref <0.30)

## 2011-07-20 LAB — PRO B NATRIURETIC PEPTIDE: Pro B Natriuretic peptide (BNP): 555.8 pg/mL — ABNORMAL HIGH (ref 0–450)

## 2011-07-20 MED ORDER — PANTOPRAZOLE SODIUM 40 MG PO TBEC
40.0000 mg | DELAYED_RELEASE_TABLET | Freq: Two times a day (BID) | ORAL | Status: DC
Start: 2011-07-20 — End: 2011-07-26
  Administered 2011-07-20 – 2011-07-26 (×12): 40 mg via ORAL
  Filled 2011-07-20 (×13): qty 1

## 2011-07-20 MED ORDER — AMLODIPINE BESYLATE 5 MG PO TABS
5.0000 mg | ORAL_TABLET | Freq: Every day | ORAL | Status: DC
  Administered 2011-07-21 – 2011-07-26 (×5): 5 mg via ORAL
  Filled 2011-07-20 (×6): qty 1

## 2011-07-20 MED ORDER — CLONIDINE HCL 0.3 MG PO TABS
0.3000 mg | ORAL_TABLET | Freq: Two times a day (BID) | ORAL | Status: DC
  Administered 2011-07-20 – 2011-07-26 (×11): 0.3 mg via ORAL
  Filled 2011-07-20 (×13): qty 1

## 2011-07-20 MED ORDER — ALBUTEROL SULFATE (5 MG/ML) 0.5% IN NEBU: 5.0000 mg | INHALATION_SOLUTION | Freq: Once | RESPIRATORY_TRACT | Status: DC

## 2011-07-20 MED ORDER — ASPIRIN 81 MG PO CHEW
81.0000 mg | CHEWABLE_TABLET | Freq: Every day | ORAL | Status: DC
Start: 2011-07-20 — End: 2011-07-26
  Administered 2011-07-21 – 2011-07-26 (×6): 81 mg via ORAL
  Filled 2011-07-20 (×6): qty 1

## 2011-07-20 MED ORDER — BISOPROLOL FUMARATE 10 MG PO TABS
10.0000 mg | ORAL_TABLET | Freq: Every day | ORAL | Status: DC
  Administered 2011-07-21 – 2011-07-26 (×6): 10 mg via ORAL
  Filled 2011-07-20 (×7): qty 1

## 2011-07-20 MED ORDER — ALBUTEROL SULFATE (5 MG/ML) 0.5% IN NEBU
2.5000 mg | INHALATION_SOLUTION | RESPIRATORY_TRACT | Status: DC
  Administered 2011-07-20 – 2011-07-24 (×22): 2.5 mg via RESPIRATORY_TRACT
  Filled 2011-07-20 (×23): qty 0.5

## 2011-07-20 MED ORDER — METHYLPREDNISOLONE SODIUM SUCC 125 MG IJ SOLR
80.0000 mg | Freq: Four times a day (QID) | INTRAMUSCULAR | Status: DC
  Administered 2011-07-20 – 2011-07-22 (×8): 80 mg via INTRAVENOUS
  Filled 2011-07-20 (×4): qty 1.28
  Filled 2011-07-20: qty 2
  Filled 2011-07-20 (×6): qty 1.28

## 2011-07-20 MED ORDER — SIMVASTATIN 40 MG PO TABS: 40.0000 mg | ORAL_TABLET | Freq: Every day | ORAL | Status: DC

## 2011-07-20 MED ORDER — IPRATROPIUM BROMIDE 0.02 % IN SOLN
0.5000 mg | Freq: Four times a day (QID) | RESPIRATORY_TRACT | Status: DC
  Administered 2011-07-20 – 2011-07-24 (×15): 0.5 mg via RESPIRATORY_TRACT
  Filled 2011-07-20 (×15): qty 2.5

## 2011-07-20 MED ORDER — ALBUTEROL SULFATE (5 MG/ML) 0.5% IN NEBU
10.0000 mg | INHALATION_SOLUTION | Freq: Once | RESPIRATORY_TRACT | Status: AC
  Administered 2011-07-20: 10 mg via RESPIRATORY_TRACT
  Filled 2011-07-20: qty 2

## 2011-07-20 MED ORDER — LORATADINE 10 MG PO TABS
10.0000 mg | ORAL_TABLET | Freq: Every day | ORAL | Status: DC
  Administered 2011-07-21 – 2011-07-26 (×6): 10 mg via ORAL
  Filled 2011-07-20 (×7): qty 1

## 2011-07-20 MED ORDER — INSULIN ASPART 100 UNIT/ML ~~LOC~~ SOLN
0.0000 [IU] | Freq: Three times a day (TID) | SUBCUTANEOUS | Status: DC
  Administered 2011-07-20: 5 [IU] via SUBCUTANEOUS
  Administered 2011-07-21 (×2): 3 [IU] via SUBCUTANEOUS
  Administered 2011-07-21: 2 [IU] via SUBCUTANEOUS
  Administered 2011-07-22 (×3): 3 [IU] via SUBCUTANEOUS
  Administered 2011-07-23: 2 [IU] via SUBCUTANEOUS
  Administered 2011-07-23 (×2): 3 [IU] via SUBCUTANEOUS
  Administered 2011-07-24 (×2): 2 [IU] via SUBCUTANEOUS
  Administered 2011-07-24 – 2011-07-25 (×2): 3 [IU] via SUBCUTANEOUS
  Administered 2011-07-25: 2 [IU] via SUBCUTANEOUS
  Filled 2011-07-20: qty 1

## 2011-07-20 MED ORDER — IPRATROPIUM BROMIDE 0.02 % IN SOLN: 0.5000 mg | Freq: Once | RESPIRATORY_TRACT | Status: DC

## 2011-07-20 MED ORDER — ASPIRIN 81 MG PO TABS: 81.0000 mg | ORAL_TABLET | Freq: Every day | ORAL | Status: DC

## 2011-07-20 MED ORDER — LATANOPROST 0.005 % OP SOLN
1.0000 [drp] | Freq: Every day | OPHTHALMIC | Status: DC
  Administered 2011-07-21 – 2011-07-25 (×5): 1 [drp] via OPHTHALMIC
  Filled 2011-07-20 (×2): qty 2.5

## 2011-07-20 MED ORDER — DEXTROSE 5 % IV SOLN
1.0000 g | INTRAVENOUS | Status: DC
  Administered 2011-07-20 – 2011-07-24 (×5): 1 g via INTRAVENOUS
  Filled 2011-07-20 (×7): qty 10

## 2011-07-20 MED ORDER — HEPARIN SODIUM (PORCINE) 5000 UNIT/ML IJ SOLN
5000.0000 [IU] | Freq: Three times a day (TID) | INTRAMUSCULAR | Status: DC
  Administered 2011-07-21 – 2011-07-25 (×14): 5000 [IU] via SUBCUTANEOUS
  Filled 2011-07-20 (×21): qty 1

## 2011-07-20 MED ORDER — CLOPIDOGREL BISULFATE 75 MG PO TABS
75.0000 mg | ORAL_TABLET | Freq: Every day | ORAL | Status: DC
  Administered 2011-07-21 – 2011-07-25 (×5): 75 mg via ORAL
  Filled 2011-07-20 (×7): qty 1

## 2011-07-20 MED ORDER — ATORVASTATIN CALCIUM 20 MG PO TABS
20.0000 mg | ORAL_TABLET | Freq: Every day | ORAL | Status: DC
  Administered 2011-07-21 – 2011-07-25 (×5): 20 mg via ORAL
  Filled 2011-07-20 (×7): qty 1

## 2011-07-20 MED ORDER — IPRATROPIUM BROMIDE 0.02 % IN SOLN
1.0000 mg | Freq: Once | RESPIRATORY_TRACT | Status: AC
  Administered 2011-07-20: 1 mg via RESPIRATORY_TRACT
  Filled 2011-07-20: qty 5

## 2011-07-20 NOTE — ED Provider Notes (Signed)
Medical screening examination/treatment/procedure(s) were performed by non-physician practitioner and as supervising physician I was immediately available for consultation/collaboration.   Laray Anger, DO 07/20/11 1936

## 2011-07-20 NOTE — ED Notes (Signed)
Pt denies chest pain at this time.

## 2011-07-20 NOTE — H&P (Signed)
Name: [name]. MRN: [id number] DOB: May 05, 1930    LOS: 0  PCCM ADMISSION NOTE  History of Present Illness: This is a 78 year old white male with severe persistent asthma. The patient has a long-standing history of multiple allergies. The patient has had at least 7 different encounters as an outpatient over the past 6 weeks with asthma flare. The patient received multiple rounds or corticosteroids and intensive neb treatments. Despite this the patient continues to worsen and [place] the patient was brought to the emergency room on 07/20/2011 for admission. Pulmonary was asked to admit this patient. The patient denies any reflux symptoms, chest pain, fever, chills or sweats. The patient does admit to productive cough of thick brown material. The patient denies any chest pain it is analogous to his prior angina.  Lines / Drains: None  Cultures: Respiratory culture 07/20/2011  Antibiotics: Rocephin 07/20/2011 (tracheobronchitis)  Tests / Events: None    Past Medical History  Diagnosis Date  . CORONARY HEART DISEASE     a. s/p CABG;  b.  cath 06/27/10: S-Dx occluded, S-PDA 80-90% (tx with PCI); S-OM ok, L-LAD ok;  EF 65-70%  c.  s/p Promus DES to S-PDA 06/2010;   d. Myoview 8/12: low risk  . FIBRILLATION, ATRIAL     post op; ?documented during hosp. 06/2010  . SLEEP APNEA 09/2001    NPSG AHI 22/HR  . ALLERGIC RHINITIS   . ASTHMA   . Depression   . GERD     with HH, hx esophageal stricture  . DYSLIPIDEMIA   . HYPERTENSION     Echo 3/12: EF 55-60%; mod LVH; mild AS/AI; LAE; PASP 38; mild pulmo HTN  . Personal history of alcoholism   . Diverticulosis   . Benign liver cyst   . Skin cancer     L forearm  . Cataract     surgery to both eyes   Past Surgical History  Procedure Date  . Coronary artery bypass graft 02/2007  . Hernia repair 12/03/07  . Tonsillectomy   . Coronary angioplasty with stent placement 07/2010  . Cataract extraction, bilateral 2007  . Hernia repair  unsure ?60's  . Cholecystectomy 02/21/2011    Procedure: LAPAROSCOPIC CHOLECYSTECTOMY WITH INTRAOPERATIVE CHOLANGIOGRAM;  Surgeon: Valarie Merino, MD;  Location: [facility];  Service: General;  Laterality: N/A;   Prior to Admission medications   Medication Sig Start Date End Date Taking? Authorizing Provider  acidophilus (RISAQUAD) CAPS Take 1 capsule by mouth daily. Phillips probiotics   Yes Historical Provider, MD  albuterol (PROAIR HFA) 108 (90 BASE) MCG/ACT inhaler Inhale 2 puffs into the lungs every 6 (six) hours as needed for wheezing or shortness of breath. 03/03/11 03/02/12 Yes Clinton D Young, MD  amLODipine (NORVASC) 5 MG tablet Take 5 mg by mouth daily.   Yes Historical Provider, MD  aspirin 81 MG tablet Take 81 mg by mouth daily.    Yes Historical Provider, MD  bisoprolol (ZEBETA) 10 MG tablet Take 1 tablet (10 mg total) by mouth daily. 07/07/10  Yes Beatrice Lecher, PA  cetirizine (ZYRTEC) 10 MG tablet Take 10 mg by mouth daily.     Yes Historical Provider, MD  cloNIDine (CATAPRES) 0.3 MG tablet Take 0.3-0.6 mg by mouth See admin instructions. Take 1 tablet in morning and 2 tablets at night 02/09/11  Yes Newt Lukes, MD  clopidogrel (PLAVIX) 75 MG tablet Take 1 tablet (75 mg total) by mouth daily. 07/07/10  Yes Beatrice Lecher, PA  ipratropium-albuterol (DUONEB) 0.5-2.5 (3) MG/3ML SOLN Take 3 mLs by nebulization every 6 (six) hours as needed. 07/03/11 07/02/12 Yes Clinton D Young, MD  latanoprost (XALATAN) 0.005 % ophthalmic solution Place 1 drop into both eyes daily.    Yes Historical Provider, MD  nitroGLYCERIN (NITROSTAT) 0.4 MG SL tablet Place 0.4 mg under the tongue every 5 (five) minutes as needed. For chest pain   Yes Historical Provider, MD  omeprazole (PRILOSEC) 20 MG capsule Take 20 mg by mouth 2 (two) times daily.    Yes Historical Provider, MD  potassium chloride SA (K-DUR,KLOR-CON) 20 MEQ tablet Take 1 tablet (20 mEq total) by mouth 2 (two) times daily. 07/07/10  Yes Beatrice Lecher, PA  pravastatin (PRAVACHOL) 80 MG tablet take 1 tablet by mouth every evening 06/23/11  Yes Rollene Rotunda, MD  predniSONE (DELTASONE) 20 MG tablet Take 60 mg by mouth daily. On 07/15/2011 start 3 tablets daily for one week then begin taper as instructed by Dr. Maple Hudson. 07/14/11  Yes Collene Gobble, MD   Allergies Allergies  Allergen Reactions  . Codeine Nausea And Vomiting    Family History Family History  Problem Relation Age of Onset  . COPD Sister   . Asthma Sister   . Emphysema Sister   . Hypertension Sister   . Hypertension Mother     Social History  reports that he quit smoking about 32 years ago. His smoking use included Cigarettes. He has a 120 pack-year smoking history. He does not have any smokeless tobacco history on file. He reports that he does not drink alcohol or use illicit drugs.  Review Of Systems  11 points review of systems is negative with an exception of listed in HPI.  Vital Signs: Temp:  [98.2 F (36.8 C)] 98.2 F (36.8 C) (04/11 1044) Pulse Rate:  [85-103] 103  (04/11 1345) Resp:  [20-24] 20  (04/11 1345) BP: (142)/(70) 142/70 mmHg (04/11 1044) SpO2:  [93 %-99 %] 93 % (04/11 1423) Weight:  [68.947 kg (152 lb)] 68.947 kg (152 lb) (04/11 1044)    Physical Examination: General: Elderly male in no acute distress Neuro:  Awake and alert, moves all fours   HEENT:  Mild nasal inflammation, dry mucous membranes, Neck:  No jugular venous distention, no thyromegaly present Cardiovascular:  Regular rate and rhythm without S3 or S4 normal S1-S2 no murmur rub heave or gallop Lungs:  Expiratory wheezes with poor airflow Abdomen:  Soft nontender, bowel sounds active no organomegaly Musculoskeletal:  Full range of motion Skin:  Skin clear      Labs and Imaging:  Reviewed.  Please refer to the Assessment and Plan section for relevant results. Dg Chest 2 View  07/20/2011  *RADIOLOGY REPORT*  Clinical Data: Wheezing, shortness of breath  CHEST - 2 VIEW   Comparison: 06/03/2011; 05/19/2011; 12/16/2010; chest CT - 06/25/2010  Findings:  Grossly unchanged enlarged cardiac silhouette and mediastinal contours post median sternotomy and CABG.  Atherosclerotic calcification within the thoracic aorta.  There is persistent mild elevation of the right hemidiaphragm.  Grossly unchanged bibasilar heterogeneous opacities favored to represent atelectasis.  Grossly unchanged bones including mild compression deformity of a lower thoracic vertebral body.  IMPRESSION: No acute cardiopulmonary disease.  Original Report Authenticated By: Waynard Reeds, M.D.     Lab 07/20/11 1055  NA 138  K 3.5  CL 100  CO2 27  GLUCOSE 162*  BUN 21  CREATININE 0.75  CALCIUM 8.9  MG --  PHOS --  Lab 07/20/11 1055  HGB 12.6*  HCT 39.0  WBC 10.5  PLT 220     Assessment and Plan: Patient Active Hospital Problem List:   Extrinsic asthma with status asthmaticus (07/20/2011)   Assessment: Status asthmaticus with poorly controlled asthma    Plan: IV Solu-Medrol, intensive neb treatments, IV Rocephin Obtain CT scan of sinus   ALLERGIC RHINITIS (05/10/2007)   Assessment: Flare of allergic rhinitis    Plan: Maintain Zyrtec, administer steroids   GERD (11/19/2009)   Assessment: Reflux may be playing a role in this case    Plan: Protonix  twice daily     Shan Levans  M.D. Pulmonary and Critical Care Medicine [facility] Cell: [phone]   Pager: [phone] 07/20/2011, 3:26 PM

## 2011-07-20 NOTE — Telephone Encounter (Signed)
[id number] x1 

## 2011-07-20 NOTE — ED Notes (Signed)
Rainbow sent to lab 1 blue, 1 light green, 1 lavender, 1 dark green

## 2011-07-20 NOTE — ED Provider Notes (Signed)
History     CSN: [id number]  Arrival date & time 07/20/11  1040   First MD Initiated Contact with Patient 07/20/11 1104      Chief Complaint  Patient presents with  . Shortness of Breath    (Consider location/radiation/quality/duration/timing/severity/associated sxs/prior treatment) The history is provided by the patient and medical records.  78 y/o M with PMH including CAD, ?a-fib, asthma, HTN, and GERD who is followed by Dr Maple Hudson (pulmonology), Dr Antoine Poche (cardiology) and Dr Felicity Coyer (PCP) presents to ED with c/c of SOB x approx 1 month with associated wheezing. Intermittent cough prod of clear-white sputum. Pt also endorses fatigue but denies weakness, change in appetite, or weight loss. Symptoms worse with exertion. Denies any fever, chills, sore throat, chest pain, pleurtitic pain, chest tightness, worsening orthopnea, N/V, abd pain, abd or LE swelling. Has been seen by urgent care, PCP, and pulmonologist for this with multiple medication regimen changes to include various breathing treatments and daily oral prednisone therapy without relief. Reports he was advised to present to ED if he continued to have symptoms.   Past Medical History  Diagnosis Date  . CORONARY HEART DISEASE     a. s/p CABG;  b.  cath 06/27/10: S-Dx occluded, S-PDA 80-90% (tx with PCI); S-OM ok, L-LAD ok;  EF 65-70%  c.  s/p Promus DES to S-PDA 06/2010;   d. Myoview 8/12: low risk  . FIBRILLATION, ATRIAL     post op; ?documented during hosp. 06/2010  . SLEEP APNEA 09/2001    NPSG AHI 22/HR  . ALLERGIC RHINITIS   . ASTHMA   . Depression   . GERD     with HH, hx esophageal stricture  . DYSLIPIDEMIA   . HYPERTENSION     Echo 3/12: EF 55-60%; mod LVH; mild AS/AI; LAE; PASP 38; mild pulmo HTN  . Personal history of alcoholism   . Diverticulosis   . Benign liver cyst   . Skin cancer     L forearm  . Cataract     surgery to both eyes    Past Surgical History  Procedure Date  . Coronary artery bypass graft  02/2007  . Hernia repair 12/03/07  . Tonsillectomy   . Coronary angioplasty with stent placement 07/2010  . Cataract extraction, bilateral 2007  . Hernia repair unsure ?60's  . Cholecystectomy 02/21/2011    Procedure: LAPAROSCOPIC CHOLECYSTECTOMY WITH INTRAOPERATIVE CHOLANGIOGRAM;  Surgeon: Valarie Merino, MD;  Location: [facility];  Service: General;  Laterality: N/A;    Family History  Problem Relation Age of Onset  . COPD Sister   . Asthma Sister   . Emphysema Sister   . Hypertension Sister   . Hypertension Mother     History  Substance Use Topics  . Smoking status: Former Smoker -- 3.0 packs/day for 40 years    Types: Cigarettes    Quit date: 04/11/1979  . Smokeless tobacco: Not on file  . Alcohol Use: No      Review of Systems 10 systems reviewed and are negative for acute change except as noted in the HPI.  Allergies  Codeine  Home Medications   Current Outpatient Rx  Name Route Sig Dispense Refill  . RISAQUAD PO CAPS Oral Take 1 capsule by mouth daily. Phillips probiotics    . ALBUTEROL SULFATE HFA 108 (90 BASE) MCG/ACT IN AERS Inhalation Inhale 2 puffs into the lungs every 6 (six) hours as needed for wheezing or shortness of breath. 1 Inhaler prn  . AMLODIPINE  BESYLATE 5 MG PO TABS Oral Take 5 mg by mouth daily.    . ASPIRIN 81 MG PO TABS Oral Take 81 mg by mouth daily.     Marland Kitchen BISOPROLOL FUMARATE 10 MG PO TABS Oral Take 1 tablet (10 mg total) by mouth daily. 30 tablet 11  . CETIRIZINE HCL 10 MG PO TABS Oral Take 10 mg by mouth daily.      Marland Kitchen CLONIDINE HCL 0.3 MG PO TABS Oral Take 0.3-0.6 mg by mouth See admin instructions. Take 1 tablet in morning and 2 tablets at night    . CLOPIDOGREL BISULFATE 75 MG PO TABS Oral Take 1 tablet (75 mg total) by mouth daily. 30 tablet 11  . IPRATROPIUM-ALBUTEROL 0.5-2.5 (3) MG/3ML IN SOLN Nebulization Take 3 mLs by nebulization every 6 (six) hours as needed. 240 mL prn  . LATANOPROST 0.005 % OP SOLN Both Eyes Place 1 drop into both  eyes daily.     Marland Kitchen NITROGLYCERIN 0.4 MG SL SUBL Sublingual Place 0.4 mg under the tongue every 5 (five) minutes as needed. For chest pain    . OMEPRAZOLE 20 MG PO CPDR Oral Take 20 mg by mouth 2 (two) times daily.     Marland Kitchen POTASSIUM CHLORIDE CRYS ER 20 MEQ PO TBCR Oral Take 1 tablet (20 mEq total) by mouth 2 (two) times daily. 60 tablet 11  . PRAVASTATIN SODIUM 80 MG PO TABS  take 1 tablet by mouth every evening 30 tablet 3  . PREDNISONE 20 MG PO TABS Oral Take 60 mg by mouth daily. On 07/15/2011 start 3 tablets daily for one week then begin taper as instructed by Dr. Maple Hudson.      BP 142/70  Pulse 85  Temp(Src) 98.2 F (36.8 C) (Oral)  Resp 24  Ht 5\' 2"  (1.575 m)  Wt 152 lb (68.947 kg)  BMI 27.80 kg/m2  SpO2 99%  Physical Exam  Nursing note and vitals reviewed. Constitutional: He is oriented to person, place, and time.  HENT:  Head: Normocephalic and atraumatic.  Right Ear: External ear normal.  Left Ear: External ear normal.  Mouth/Throat: Oropharynx is clear and moist.  Eyes: Pupils are equal, round, and reactive to light. Right eye exhibits no discharge. Left eye exhibits no discharge.  Neck: Normal range of motion. Neck supple.  Cardiovascular: Normal rate, regular rhythm, normal heart sounds and intact distal pulses.   No murmur heard. Pulmonary/Chest: No accessory muscle usage. He has decreased breath sounds in the right upper field, the right lower field, the left upper field, the left middle field and the left lower field. He has wheezes in the right upper field, the right middle field, the right lower field, the left upper field, the left middle field and the left lower field.       Slightly increased WOB on exam. Pt speaks in complete sentences.  Abdominal: Soft. Bowel sounds are normal. He exhibits no distension. There is no tenderness.  Musculoskeletal: Normal range of motion. He exhibits no edema and no tenderness.       Calves supple, non-tender and without edema.    Neurological: He is oriented to person, place, and time. No cranial nerve deficit.  Skin: Skin is warm and dry.  Psychiatric: He has a normal mood and affect.    ED Course  Procedures (including critical care time)  Labs Reviewed  CBC - Abnormal; Notable for the following:    Hemoglobin 12.6 (*)    All other components within normal limits  BASIC METABOLIC PANEL - Abnormal; Notable for the following:    Glucose, Bld 162 (*)    GFR calc non Af Amer 86 (*)    All other components within normal limits  PRO B NATRIURETIC PEPTIDE - Abnormal; Notable for the following:    Pro B Natriuretic peptide (BNP) 555.8 (*)    All other components within normal limits  TROPONIN I  D-DIMER, QUANTITATIVE   Dg Chest 2 View  07/20/2011  *RADIOLOGY REPORT*  Clinical Data: Wheezing, shortness of breath  CHEST - 2 VIEW  Comparison: 06/03/2011; 05/19/2011; 12/16/2010; chest CT - 06/25/2010  Findings:  Grossly unchanged enlarged cardiac silhouette and mediastinal contours post median sternotomy and CABG.  Atherosclerotic calcification within the thoracic aorta.  There is persistent mild elevation of the right hemidiaphragm.  Grossly unchanged bibasilar heterogeneous opacities favored to represent atelectasis.  Grossly unchanged bones including mild compression deformity of a lower thoracic vertebral body.  IMPRESSION: No acute cardiopulmonary disease.  Original Report Authenticated By: Waynard Reeds, M.D.     Date: 07/20/2011, 11:34 AM  Rate: 81  Rhythm: sinus  QRS Axis: normal  Intervals: normal  ST/T Wave abnormalities: normal  Conduction Disutrbances:none  Narrative Interpretation: Prior EKG from 02/15/2011 reviewed, poor R-wave progression present on both tracings. Incomplete RBBB with rsr' in V1 on prior tracing not seen today.  Old EKG Reviewed: changes noted    Dx 1: Asthma with status asthmaticus   MDM  Failure of outpt treatment for SOB and wheezing. Symptoms atypical for ACS and given  lack of EKG changes combined with negative troponin after extended symptom duration, I doubt cardiac etiology. BNP slightly elevated at 555, but this result is not remarkable enough combined with lack of findings c/w CHF on CXR or physical exam to suggest heart failure as a cause for his prolonged symptoms. CBC and BMP without significant findings. I spoke with Dr Tyson Alias, who agrees that pt sounds appropriate for pulmonology admit.        Shaaron Adler, PA-C 07/20/11 1556

## 2011-07-20 NOTE — ED Notes (Signed)
Pt presenting to ed with c/o shortness of breath x 3-4 weeks pt states he has been seeing his pcp but he isn't getting any better and his pcp told him to present to the ed. Pt is audibly wheezing. Pt's sat's are100% on the monitor pt able to speak in full sentences with out difficulty. Pt is alert and oriented at this time

## 2011-07-20 NOTE — ED Notes (Signed)
Blood work drawn

## 2011-07-21 LAB — GLUCOSE, CAPILLARY
Glucose-Capillary: 164 mg/dL — ABNORMAL HIGH (ref 70–99)
Glucose-Capillary: 127 mg/dL — ABNORMAL HIGH (ref 70–99)

## 2011-07-21 LAB — CBC
HCT: 39.5 % (ref 39.0–52.0)
Hemoglobin: 12.9 g/dL — ABNORMAL LOW (ref 13.0–17.0)
MCV: 84.2 fL (ref 78.0–100.0)
RBC: 4.69 MIL/uL (ref 4.22–5.81)
RDW: 15.1 % (ref 11.5–15.5)
WBC: 5 10*3/uL (ref 4.0–10.5)

## 2011-07-21 LAB — BASIC METABOLIC PANEL
BUN: 18 mg/dL (ref 6–23)
CO2: 29 mEq/L (ref 19–32)
Chloride: 100 mEq/L (ref 96–112)
Creatinine, Ser: 0.68 mg/dL (ref 0.50–1.35)
Glucose, Bld: 173 mg/dL — ABNORMAL HIGH (ref 70–99)

## 2011-07-21 NOTE — Progress Notes (Signed)
CARE MANAGEMENT NOTE 07/21/2011  Patient:  [name], [name]   Account Number:  [id number]  Date Initiated:  07/21/2011  Documentation initiated by:  Georgann Bramble  Subjective/Objective Assessment:   pt with hx of copd and asthma with failed outpatient treatment x6 weeks with worsening progression of symptoms     Action/Plan:   lives at home with spouse   Anticipated DC Date:  07/24/2011   Anticipated DC Plan:  HOME/SELF CARE  In-house referral  NA      DC Planning Services  NA      [facility] Choice  NA   Choice offered to / List presented to:  NA   DME arranged  NA      DME agency  NA     HH arranged  NA      HH agency  NA   Status of service:  In process, will continue to follow Medicare Important Message given?  NA - LOS <3 / Initial given by admissions (If response is "NO", the following Medicare IM given date fields will be blank) Date Medicare IM given:   Date Additional Medicare IM given:    Discharge Disposition:    Per UR Regulation:  Reviewed for med. necessity/level of care/duration of stay  If discussed at Long Length of Stay Meetings, dates discussed:    Comments:  [id number]/Ardie Mclennan Lorrin Mais Case Management [phone]

## 2011-07-21 NOTE — Progress Notes (Signed)
Name: [name]. MRN: [id number] DOB: [birth year]    LOS: 1  PCCM FU NOTE  PCP - Felicity Coyer Pulm- [place] History of Present Illness: This is a 78 year old white male with severe persistent asthma. The patient has a long-standing history of multiple allergies. The patient has had at least 7 different encounters as an outpatient over the past 6 weeks with asthma flare. The patient received multiple rounds or corticosteroids and intensive neb treatments. Despite this the patient continues to worsen and [place] the patient was brought to the emergency room on 07/20/2011 for admission. Pulmonary was asked to admit this patient.   Lines / Drains: None  Cultures: Respiratory culture 07/20/2011  Antibiotics: Rocephin 07/20/2011 (tracheobronchitis)  Tests / Events: CT sinus 4/11 neg    Past Medical History  Diagnosis Date  . CORONARY HEART DISEASE     a. s/p CABG;  b.  cath 06/27/10: S-Dx occluded, S-PDA 80-90% (tx with PCI); S-OM ok, L-LAD ok;  EF 65-70%  c.  s/p Promus DES to S-PDA 06/2010;   d. Myoview 8/12: low risk  . FIBRILLATION, ATRIAL     post op; ?documented during hosp. 06/2010  . SLEEP APNEA 09/2001    NPSG AHI 22/HR  . ALLERGIC RHINITIS   . ASTHMA   . Depression   . GERD     with HH, hx esophageal stricture  . DYSLIPIDEMIA   . HYPERTENSION     Echo 3/12: EF 55-60%; mod LVH; mild AS/AI; LAE; PASP 38; mild pulmo HTN  . Personal history of alcoholism   . Diverticulosis   . Benign liver cyst   . Skin cancer     L forearm  . Cataract     surgery to both eyes   The patient denies any reflux symptoms, chest pain, fever, chills or sweats. The patient does admit to productive cough of thick brown material. The patient denies any chest pain  Vital Signs: Temp:  [97.4 F (36.3 C)-98.2 F (36.8 C)] 97.4 F (36.3 C) (04/12 0634) Pulse Rate:  [85-104] 104  (04/12 0634) Resp:  [20-24] 20  (04/12 0634) BP: (139-166)/(66-85) 155/85 mmHg (04/12 0931) SpO2:  [93 %-99 %] 97 %  (04/12 0937) Weight:  [68.947 kg (152 lb)] 68.947 kg (152 lb) (04/11 1044)    Physical Examination: General: Elderly male in no acute distress Neuro:  Awake and alert, moves all fours   HEENT:  Mild nasal inflammation, dry mucous membranes, Neck:  No jugular venous distention, no thyromegaly present Cardiovascular:  Regular rate and rhythm without S3 or S4 normal S1-S2 no murmur rub heave or gallop Lungs:  Expiratory wheezes with poor airflow Abdomen:  Soft nontender, bowel sounds active no organomegaly Musculoskeletal:  Full range of motion Skin:  Skin clear      Labs and Imaging:  Reviewed.  Please refer to the Assessment and Plan section for relevant results. Dg Chest 2 View  07/20/2011  *RADIOLOGY REPORT*  Clinical Data: Wheezing, shortness of breath  CHEST - 2 VIEW  Comparison: 06/03/2011; 05/19/2011; 12/16/2010; chest CT - 06/25/2010  Findings:  Grossly unchanged enlarged cardiac silhouette and mediastinal contours post median sternotomy and CABG.  Atherosclerotic calcification within the thoracic aorta.  There is persistent mild elevation of the right hemidiaphragm.  Grossly unchanged bibasilar heterogeneous opacities favored to represent atelectasis.  Grossly unchanged bones including mild compression deformity of a lower thoracic vertebral body.  IMPRESSION: No acute cardiopulmonary disease.  Original Report Authenticated By: Judene Companion.D.  Ct Maxillofacial  Ltd Wo Cm  07/20/2011  *RADIOLOGY REPORT*  Clinical Data:  Evaluate for sinusitis.  Shortness of breath.  CT LIMITED SINUSES WITHOUT CONTRAST  Technique:  Multidetector CT images of the paranasal sinuses were obtained in a single plane without contrast.  Comparison:   None.  Findings:  Visualized portions of the mastoids and paranasal sinuses are well pneumatized, without significant areas of mucosal thickening or air-fluid levels. There are small soft tissue attenuation lesions in the anterior aspect of the maxillary  sinuses bilaterally that may represent small mucosal retention cysts or polyps.  IMPRESSION: 1.  No evidence to suggest acute sinusitis. 2.  Small mucosal retention cyst versus small polyps in the anterior aspects of the maxillary sinuses bilaterally.  Original Report Authenticated By: Florencia Reasons, M.D.     Lab 07/21/11 0505 07/20/11 1055  NA 140 138  K 3.2* 3.5  CL 100 100  CO2 29 27  GLUCOSE 173* 162*  BUN 18 21  CREATININE 0.68 0.75  CALCIUM 8.7 8.9  MG -- --  PHOS -- --    Lab 07/21/11 0505 07/20/11 1055  HGB 12.9* 12.6*  HCT 39.5 39.0  WBC 5.0 10.5  PLT 196 220     Assessment and Plan: Patient Active Hospital Problem List:   Extrinsic asthma with status asthmaticus (07/20/2011)   Assessment: Status asthmaticus with poorly controlled asthma , CT scan  sinus neg, no GERD or obvious extrinsic trigger, failed outpt therapy   Plan: IV Solu-Medrol, intensive neb treatments, IV Rocephin   ALLERGIC RHINITIS (05/10/2007)   Assessment: Flare of allergic rhinitis    Plan: Maintain Zyrtec, administer steroids   GERD (11/19/2009)   Assessment: Reflux may be playing a role in this case    Plan: Protonix  twice daily   Cyril Mourning MD. FCCP. [facility] Pager [phone] If no response call [phone]   07/21/2011, 10:20 AM

## 2011-07-21 NOTE — Progress Notes (Signed)
Patient refuses to use hospital equipment for CPAP tonight. Wife at bedside agrees to bring in home unit tomorrow for nocturnal use going forth during this visit. RT to follow-up with MD order for home equipment use and Biomed safety check.

## 2011-07-21 NOTE — Telephone Encounter (Signed)
LMTCB x2  

## 2011-07-22 DIAGNOSIS — J45909 Unspecified asthma, uncomplicated: Secondary | ICD-10-CM

## 2011-07-22 LAB — GLUCOSE, CAPILLARY: Glucose-Capillary: 171 mg/dL — ABNORMAL HIGH (ref 70–99)

## 2011-07-22 LAB — BASIC METABOLIC PANEL
CO2: 27 mEq/L (ref 19–32)
Calcium: 8.6 mg/dL (ref 8.4–10.5)
Chloride: 101 mEq/L (ref 96–112)
Creatinine, Ser: 0.65 mg/dL (ref 0.50–1.35)
Glucose, Bld: 184 mg/dL — ABNORMAL HIGH (ref 70–99)

## 2011-07-22 MED ORDER — METHYLPREDNISOLONE SODIUM SUCC 125 MG IJ SOLR
80.0000 mg | Freq: Three times a day (TID) | INTRAMUSCULAR | Status: DC
  Administered 2011-07-22: 80 mg via INTRAVENOUS
  Administered 2011-07-23: 22:00:00 via INTRAVENOUS
  Administered 2011-07-23 – 2011-07-24 (×3): 80 mg via INTRAVENOUS
  Filled 2011-07-22 (×8): qty 1.28

## 2011-07-22 MED ORDER — MONTELUKAST SODIUM 5 MG PO CHEW: 10.0000 mg | CHEWABLE_TABLET | Freq: Every day | ORAL | Status: DC

## 2011-07-22 MED ORDER — FAMOTIDINE 20 MG PO TABS
20.0000 mg | ORAL_TABLET | Freq: Every day | ORAL | Status: DC
  Administered 2011-07-23 – 2011-07-25 (×3): 20 mg via ORAL
  Filled 2011-07-22 (×6): qty 1

## 2011-07-22 MED ORDER — MONTELUKAST SODIUM 10 MG PO TABS
10.0000 mg | ORAL_TABLET | Freq: Every day | ORAL | Status: DC
  Administered 2011-07-22 – 2011-07-25 (×4): 10 mg via ORAL
  Filled 2011-07-22 (×5): qty 1

## 2011-07-22 NOTE — Progress Notes (Signed)
  Name: [name]. MRN: [id number] DOB: 06-04-1930    LOS: 2  PCCM FU NOTE  PCP - Ailene Ards Brief patient profile:    78 year old white male with severe persistent asthma. The patient has a long-standing history of multiple allergies. The patient has had at least 7 different encounters as an outpatient over the past 6 weeks prior to admit with asthma flare. The patient received multiple rounds or corticosteroids and intensive neb treatments. Despite this the patient continues to worsen and [place] the patient was brought to the emergency room on 07/20/2011 for admission. Pulmonary was asked to admit this patient.   Lines / Drains: None  Cultures: Respiratory culture 07/20/2011  Antibiotics: Rocephin 07/20/2011 (tracheobronchitis)  Tests / Events: CT sinus 4/11 > neg  Add singulair 4/13  Subjective: Still feeling tight/ congested, no purulent sputum  Physical Examination: General: Elderly male in no acute distress Neuro:  Awake and alert, moves all fours   HEENT:  Mild nasal inflammation, dry mucous membranes, Neck:  No jugular venous distention, no thyromegaly present Cardiovascular:  Regular rate and rhythm without S3 or S4 normal S1-S2 no murmur rub heave or gallop Lungs:  Expiratory wheezes with poor airflow Abdomen:  Soft nontender, bowel sounds active no organomegaly Musculoskeletal:  Full range of motion Skin:  Skin clear      Labs and Imaging:  Reviewed.  Please refer to the Assessment and Plan section for relevant results. [facility]  07/20/2011  *RADIOLOGY REPORT*  Clinical Data:  Evaluate for sinusitis.  Shortness of breath.  CT LIMITED SINUSES WITHOUT CONTRAST  Technique:  Multidetector CT images of the paranasal sinuses were obtained in a single plane without contrast.  Comparison:   None.  Findings:  Visualized portions of the mastoids and paranasal sinuses are well pneumatized, without significant areas of mucosal thickening or  air-fluid levels. There are small soft tissue attenuation lesions in the anterior aspect of the maxillary sinuses bilaterally that may represent small mucosal retention cysts or polyps.  IMPRESSION: 1.  No evidence to suggest acute sinusitis. 2.  Small mucosal retention cyst versus small polyps in the anterior aspects of the maxillary sinuses bilaterally.  Original Report Authenticated By: Florencia Reasons, M.D.     Lab 07/22/11 0613 07/21/11 0505 07/20/11 1055  NA 140 140 138  K 3.1* 3.2* --  CL 101 100 100  CO2 27 29 27   GLUCOSE 184* 173* 162*  BUN 21 18 21   CREATININE 0.65 0.68 0.75  CALCIUM 8.6 8.7 8.9  MG -- -- --  PHOS -- -- --    Lab 07/21/11 0505 07/20/11 1055  HGB 12.9* 12.6*  HCT 39.5 39.0  WBC 5.0 10.5  PLT 196 220     Assessment and Plan: Patient Active Hospital Problem List:   Extrinsic asthma with status asthmaticus (07/20/2011)   Assessment: Status asthmaticus with poorly controlled asthma , CT scan  sinus neg, no GERD or obvious extrinsic trigger, failed outpt therapy   Plan: IV Solu-Medrol, intensive neb treatments, IV Rocephin   ALLERGIC RHINITIS (05/10/2007)   Assessment: Flare of allergic rhinitis    Plan: Maintain Zyrtec, administer steroids   GERD (11/19/2009)   Assessment: Reflux may be playing a role in this case    Plan: Protonix  twice daily plus added pepc 20 mg at bedtime 4/13

## 2011-07-22 NOTE — Progress Notes (Signed)
Patient has home CPAP machine. Has been checked by [facility] and RT set up for patient. Instructed to call if he needed further assistance.

## 2011-07-23 ENCOUNTER — Encounter: Payer: Self-pay | Admitting: Internal Medicine

## 2011-07-23 LAB — GLUCOSE, CAPILLARY
Glucose-Capillary: 139 mg/dL — ABNORMAL HIGH (ref 70–99)
Glucose-Capillary: 141 mg/dL — ABNORMAL HIGH (ref 70–99)
Glucose-Capillary: 164 mg/dL — ABNORMAL HIGH (ref 70–99)
Glucose-Capillary: 159 mg/dL — ABNORMAL HIGH (ref 70–99)

## 2011-07-23 MED ORDER — CLOTRIMAZOLE 1 % EX CREA
TOPICAL_CREAM | Freq: Two times a day (BID) | CUTANEOUS | Status: DC
  Administered 2011-07-23 – 2011-07-25 (×6): via TOPICAL
  Filled 2011-07-23: qty 15

## 2011-07-23 NOTE — Assessment & Plan Note (Signed)
Clinically he is in sinus rhythm today.

## 2011-07-23 NOTE — Assessment & Plan Note (Addendum)
Steroid-dependent exacerbation. No clear trigger, although viral infections reflux and seasonal allergy may all contribute. He does not recognize reflux but we have again reviewed reflux precautions. If we can't break this episode, I have recommended hospitalization. Plan - he had significantly improved after nebulized Brovana followed by nebulized Xopenex, and Depo-Medrol 120 mg today.

## 2011-07-23 NOTE — Progress Notes (Signed)
MD, pt's daughter is concerned that the pt suffers from anxiety and is real high strung and wants to know if you would speak to the pt about this to determine if he needs meds for it.

## 2011-07-23 NOTE — Assessment & Plan Note (Signed)
Has continued allergy vaccine and has felt that it helped.

## 2011-07-23 NOTE — Progress Notes (Signed)
  Name: [name]. MRN: [id number] DOB: [birth year]    LOS: 3  PCCM Service  PCP - Felicity Coyer Pulm- [facility] Brief patient pr ofile:    80 yowm quit smoking 25 years pta  With dx severe persistent asthma. The patient has a long-standing history of multiple allergies. The patient has had at least 7 different encounters as an outpatient over the past 6 weeks prior to admit with asthma flare. The patient received multiple rounds or corticosteroids and intensive neb treatments. Despite this the patient continues to worsen and the patient was brought to the emergency room on 04/11  for admission. Pulmonary was asked to admit this patient.   Lines / Drains: None  Cultures: Respiratory culture ordered 04/11>   Antibiotics: Rocephin 04/11 (tracheobronchitis)>>>  Tests / Events: CT sinus 4/11 > neg  Add singulair 4/13  Subjective: Still feeling tight/ congested, no purulent sputum - doing better with purse lip/ flutter valve  Physical Examination: General: Elderly male in no acute distress Neuro:  Awake and alert, moves all fours   HEENT:  Mild nasal inflammation, dry mucous membranes, Neck:  No jugular venous distention, no thyromegaly present Cardiovascular:  Regular rate and rhythm without S3 or S4 normal S1-S2 no murmur rub heave or gallop Lungs:  Expiratory wheezes with poor airflow and prominent pseudowheeze, partially clears with Purse lip Abdomen:  Soft nontender, bowel sounds active no organomegaly Musculoskeletal:  Full range of motion Skin:  Skin clear      Labs and Imaging:  Reviewed.  Please refer to the Assessment and Plan section for relevant results. No results found.   Lab 07/22/11 0613 07/21/11 0505 07/20/11 1055  NA 140 140 138  K 3.1* 3.2* --  CL 101 100 100  CO2 27 29 27   GLUCOSE 184* 173* 162*  BUN 21 18 21   CREATININE 0.65 0.68 0.75  CALCIUM 8.6 8.7 8.9  MG -- -- --  PHOS -- -- --    Lab 07/21/11 0505 07/20/11 1055  HGB 12.9* 12.6*  HCT 39.5  39.0  WBC 5.0 10.5  PLT 196 220     Assessment and Plan: Patient Active Hospital Problem List:   Extrinsic asthma with status asthmaticus (07/20/2011)   Assessment: Status asthmaticus with poorly controlled asthma , CT scan  sinus neg, no GERD or obvious extrinsic trigger, failed outpt therapy   Plan: IV Solu-Medrol, intensive neb treatments, IV Rocephin Added singulair 4/13   ALLERGIC RHINITIS (05/10/2007)   Assessment: Flare of allergic rhinitis    Plan: Maintain Zyrtec, administer steroids   GERD (11/19/2009)   Assessment: Reflux may be playing a role in this case    Plan: Protonix  twice daily plus added pepcid 20 mg at bedtime added 4/13  Sandrea Hughs, MD Pulmonary and Critical Care Medicine [facility] Cell [phone]

## 2011-07-23 NOTE — Assessment & Plan Note (Signed)
He remains compliant with CPAP without change.

## 2011-07-24 DIAGNOSIS — J310 Chronic rhinitis: Secondary | ICD-10-CM

## 2011-07-24 DIAGNOSIS — G4733 Obstructive sleep apnea (adult) (pediatric): Secondary | ICD-10-CM

## 2011-07-24 DIAGNOSIS — J45902 Unspecified asthma with status asthmaticus: Secondary | ICD-10-CM

## 2011-07-24 LAB — GLUCOSE, CAPILLARY: Glucose-Capillary: 133 mg/dL — ABNORMAL HIGH (ref 70–99)

## 2011-07-24 MED ORDER — IPRATROPIUM BROMIDE 0.02 % IN SOLN
0.5000 mg | Freq: Four times a day (QID) | RESPIRATORY_TRACT | Status: DC
  Administered 2011-07-24 – 2011-07-26 (×7): 0.5 mg via RESPIRATORY_TRACT
  Filled 2011-07-24 (×7): qty 2.5

## 2011-07-24 MED ORDER — ALBUTEROL SULFATE (5 MG/ML) 0.5% IN NEBU: 2.5000 mg | INHALATION_SOLUTION | RESPIRATORY_TRACT | Status: DC | PRN

## 2011-07-24 MED ORDER — METHYLPREDNISOLONE SODIUM SUCC 40 MG IJ SOLR
40.0000 mg | Freq: Two times a day (BID) | INTRAMUSCULAR | Status: DC
  Administered 2011-07-24 – 2011-07-25 (×2): 40 mg via INTRAVENOUS
  Filled 2011-07-24 (×4): qty 1

## 2011-07-24 MED ORDER — ALBUTEROL SULFATE (5 MG/ML) 0.5% IN NEBU
2.5000 mg | INHALATION_SOLUTION | Freq: Four times a day (QID) | RESPIRATORY_TRACT | Status: DC
  Administered 2011-07-24 – 2011-07-26 (×7): 2.5 mg via RESPIRATORY_TRACT
  Filled 2011-07-24 (×7): qty 0.5

## 2011-07-24 NOTE — Telephone Encounter (Signed)
Per Florentina Addison, PEW came by office same day and informed CDY > CDY aware.  Will sign off.

## 2011-07-24 NOTE — Progress Notes (Signed)
  Name: [name]. MRN: [id number] DOB: 03/09/1931    LOS: 4  PCCM Service  PCP - Ailene Ards Brief patient profile:     78 yo former smoker admitted 04/11 with acute asthma exacerbation after failed outpt management.  Followed by Dr. Maple Hudson as outpt for severe, chronic asthma and allergies. PMHx OSA, A fib, CAD, GERD  Cultures: Respiratory culture ordered 04/11>  Antibiotics: Rocephin 04/11 (tracheobronchitis)>>>  Tests / Events: 4/11 CT sinus>>Small mucosal retention cyst versus small polyps in the anterior aspects of the maxillary sinuses bilaterally. 4/13 Add singulair  Subjective: Still has some chest congestion, but feels better.  Feels hungry.  Physical Examination: BP 172/98  Pulse 90  Temp(Src) 97.5 F (36.4 C) (Oral)  Resp 18  Ht 5\' 2"  (1.575 m)  Wt 152 lb (68.947 kg)  BMI 27.80 kg/m2  SpO2 97%  General - no distress HEENT - no sinus tenderness Cardiac - s1s2 regular Chest - coarse breath sounds, b/l rhonchi, no wheeze Abd - soft, nontender Ext - no edema Neuro - normal strength Psych - normal mood, behavior  CBC    Component Value Date/Time   WBC 5.0 07/21/2011 0505   WBC 7.7 06/03/2011 1446   RBC 4.69 07/21/2011 0505   RBC 4.45* 06/03/2011 1446   HGB 12.9* 07/21/2011 0505   HGB 12.2* 06/03/2011 1446   HCT 39.5 07/21/2011 0505   HCT 38.3* 06/03/2011 1446   PLT 196 07/21/2011 0505   MCV 84.2 07/21/2011 0505   MCV 86.1 06/03/2011 1446   MCH 27.5 07/21/2011 0505   MCH 27.4 06/03/2011 1446   MCHC 32.7 07/21/2011 0505   MCHC 31.9 06/03/2011 1446   RDW 15.1 07/21/2011 0505   LYMPHSABS 0.9 02/16/2011 0035   MONOABS 0.9 02/16/2011 0035   EOSABS 0.0 02/16/2011 0035   BASOSABS 0.0 02/16/2011 0035    BMET    Component Value Date/Time   NA 140 07/22/2011 0613   K 3.1* 07/22/2011 0613   CL 101 07/22/2011 0613   CO2 27 07/22/2011 0613   GLUCOSE 184* 07/22/2011 0613   BUN 21 07/22/2011 0613   CREATININE 0.65 07/22/2011 0613   CALCIUM 8.6 07/22/2011 0613   GFRNONAA >90 07/22/2011 0613   GFRAA >90 07/22/2011 0613    Assessment and Plan:  Acute asthma exacerbation with tracheobronchitis>>slowly improving -D5/7 Abx, currently on rocephin -decrease solumedrol -continue scheduled nebs -continue loratadine, montelukast  Allergic rhinitis -continue loratadine, montelukast -may need to augment sinus regimen if he does not improved further  GERD -continue pepcid, protonix  Hx of CAD, HTN, PAF, Hyperlipidemia -continue norvasc, ASA, lipitor, bisoprolol, clonidine, plavix  OSA -continue nocturnal CPAP  Steroid induced hyperglycemia -SSI -change to CHO modified diet  Hypokalemia -f/u BMET  Brett Canales Minor ACNP [facility] Pager [phone] till 3 pm If no answer page [phone] 07/24/2011, 1:21 PM  Reviewed above, examined pt, and agree with assessment/plan.  Coralyn Helling, MD 07/24/2011, 2:25 PM Pager:  [phone]

## 2011-07-25 DIAGNOSIS — J45902 Unspecified asthma with status asthmaticus: Secondary | ICD-10-CM

## 2011-07-25 DIAGNOSIS — J310 Chronic rhinitis: Secondary | ICD-10-CM

## 2011-07-25 DIAGNOSIS — G4733 Obstructive sleep apnea (adult) (pediatric): Secondary | ICD-10-CM

## 2011-07-25 LAB — BASIC METABOLIC PANEL
CO2: 32 mEq/L (ref 19–32)
Chloride: 102 mEq/L (ref 96–112)
Glucose, Bld: 167 mg/dL — ABNORMAL HIGH (ref 70–99)
Potassium: 3.6 mEq/L (ref 3.5–5.1)
Sodium: 141 mEq/L (ref 135–145)

## 2011-07-25 LAB — GLUCOSE, CAPILLARY
Glucose-Capillary: 116 mg/dL — ABNORMAL HIGH (ref 70–99)
Glucose-Capillary: 150 mg/dL — ABNORMAL HIGH (ref 70–99)

## 2011-07-25 MED ORDER — PREDNISONE 20 MG PO TABS
30.0000 mg | ORAL_TABLET | Freq: Every day | ORAL | Status: DC
  Administered 2011-07-25 – 2011-07-26 (×2): 30 mg via ORAL
  Filled 2011-07-25 (×2): qty 1

## 2011-07-25 NOTE — Progress Notes (Signed)
UR completed 

## 2011-07-25 NOTE — Progress Notes (Signed)
  Name: [name]. MRN: [id number] DOB: [birth year]    LOS: 5  PCCM Service  PCP - Ailene Ards Brief patient profile:     78 yo former smoker admitted 04/11 with acute asthma exacerbation after failed outpt management.  Followed by Dr. Maple Hudson as outpt for severe, chronic asthma and allergies. PMHx OSA, A fib, CAD, GERD  Cultures: Respiratory culture ordered 04/11>  Antibiotics: Rocephin 04/11 (tracheobronchitis)>>>  Tests / Events: 4/11 CT sinus>>Small mucosal retention cyst versus small polyps in the anterior aspects of the maxillary sinuses bilaterally. 4/13 Add singulair  Subjective: Decreased wheeze, chest congestion.  Feels like he is getting better.  Physical Examination: BP 158/83  Pulse 79  Temp(Src) 97.5 F (36.4 C) (Oral)  Resp 18  Ht 5\' 2"  (1.575 m)  Wt 152 lb (68.947 kg)  BMI 27.80 kg/m2  SpO2 98%  General - no distress HEENT - no sinus tenderness Cardiac - s1s2 regular Chest - scattered rhonchi, no wheeze Abd - soft, nontender Ext - no edema Neuro - normal strength Psych - normal mood, behavior  CBC    Component Value Date/Time   WBC 5.0 07/21/2011 0505   WBC 7.7 06/03/2011 1446   RBC 4.69 07/21/2011 0505   RBC 4.45* 06/03/2011 1446   HGB 12.9* 07/21/2011 0505   HGB 12.2* 06/03/2011 1446   HCT 39.5 07/21/2011 0505   HCT 38.3* 06/03/2011 1446   PLT 196 07/21/2011 0505   MCV 84.2 07/21/2011 0505   MCV 86.1 06/03/2011 1446   MCH 27.5 07/21/2011 0505   MCH 27.4 06/03/2011 1446   MCHC 32.7 07/21/2011 0505   MCHC 31.9 06/03/2011 1446   RDW 15.1 07/21/2011 0505   LYMPHSABS 0.9 02/16/2011 0035   MONOABS 0.9 02/16/2011 0035   EOSABS 0.0 02/16/2011 0035   BASOSABS 0.0 02/16/2011 0035    BMET    Component Value Date/Time   NA 141 07/25/2011 0505   K 3.6 07/25/2011 0505   CL 102 07/25/2011 0505   CO2 32 07/25/2011 0505   GLUCOSE 167* 07/25/2011 0505   BUN 29* 07/25/2011 0505   CREATININE 0.81 07/25/2011 0505   CALCIUM 8.2* 07/25/2011 0505   GFRNONAA  83* 07/25/2011 0505   GFRAA >90 07/25/2011 0505    Assessment and Plan:  Acute asthma exacerbation with tracheobronchitis>>slowly improving -?if he has component of VCD also -D6/7 Abx, currently on rocephin -change solumedrol to prednisone -continue scheduled nebs -continue loratadine, montelukast  Allergic rhinitis -continue loratadine, montelukast  GERD -continue pepcid, protonix while inpatient, and then re-assess as outpt  Hx of CAD, HTN, PAF, Hyperlipidemia -continue norvasc, ASA, bisoprolol, clonidine, plavix -was on simvastatin as outpt>>change to atorvastatin due to possible interaction with amlodipine per pharmacy  OSA -continue nocturnal CPAP  Steroid induced hyperglycemia -SSI -CHO modified diet while on steroids  Hypokalemia -f/u BMET intermittently  Disposition -possible d/c home 4/17  Coralyn Helling, MD 07/25/2011, 7:52 AM Pager:  [phone]

## 2011-07-25 NOTE — Significant Event (Signed)
Updated pt's wife [name] over phone about current health status for Mr. Rhoads, and reviewed anticipated treatment plan and goal d/c home date.  Coralyn Helling, MD 07/25/2011, 3:53 PM Pager:  [phone]

## 2011-07-26 LAB — GLUCOSE, CAPILLARY: Glucose-Capillary: 133 mg/dL — ABNORMAL HIGH (ref 70–99)

## 2011-07-26 MED ORDER — MONTELUKAST SODIUM 10 MG PO TABS: 10.0000 mg | ORAL_TABLET | Freq: Every day | ORAL | Status: DC

## 2011-07-26 MED ORDER — PREDNISONE 10 MG PO TABS: ORAL_TABLET | ORAL | Status: DC

## 2011-07-26 MED ORDER — FAMOTIDINE 20 MG PO TABS: 20.0000 mg | ORAL_TABLET | Freq: Every day | ORAL | Status: DC

## 2011-07-26 NOTE — Progress Notes (Signed)
  Name: [name]. MRN: [id number] DOB: 1930/05/11    LOS: 6  PCCM Service  PCP - Ailene Ards Brief patient profile:     78 yo former smoker admitted 04/11 with acute asthma exacerbation after failed outpt management.  Followed by Dr. Maple Hudson as outpt for severe, chronic asthma and allergies. PMHx OSA, A fib, CAD, GERD  Antibiotics: Rocephin 04/11 (tracheobronchitis)>>>4/17  Tests / Events: 4/11 CT sinus>>Small mucosal retention cyst versus small polyps in the anterior aspects of the maxillary sinuses bilaterally. 4/13 Add singulair  Subjective: Decreased wheeze, chest congestion.  Feels like he is getting better.  Physical Examination: BP 134/66  Pulse 76  Temp(Src) 98.4 F (36.9 C) (Oral)  Resp 20  Ht 5\' 2"  (1.575 m)  Wt 152 lb (68.947 kg)  BMI 27.80 kg/m2  SpO2 97%  General - no distress HEENT - no sinus tenderness Cardiac - s1s2 regular Chest - scattered rhonchi, no wheeze Abd - soft, nontender Ext - no edema Neuro - normal strength Psych - normal mood, behavior  CBC    Component Value Date/Time   WBC 5.0 07/21/2011 0505   WBC 7.7 06/03/2011 1446   RBC 4.69 07/21/2011 0505   RBC 4.45* 06/03/2011 1446   HGB 12.9* 07/21/2011 0505   HGB 12.2* 06/03/2011 1446   HCT 39.5 07/21/2011 0505   HCT 38.3* 06/03/2011 1446   PLT 196 07/21/2011 0505   MCV 84.2 07/21/2011 0505   MCV 86.1 06/03/2011 1446   MCH 27.5 07/21/2011 0505   MCH 27.4 06/03/2011 1446   MCHC 32.7 07/21/2011 0505   MCHC 31.9 06/03/2011 1446   RDW 15.1 07/21/2011 0505   LYMPHSABS 0.9 02/16/2011 0035   MONOABS 0.9 02/16/2011 0035   EOSABS 0.0 02/16/2011 0035   BASOSABS 0.0 02/16/2011 0035    BMET    Component Value Date/Time   NA 141 07/25/2011 0505   K 3.6 07/25/2011 0505   CL 102 07/25/2011 0505   CO2 32 07/25/2011 0505   GLUCOSE 167* 07/25/2011 0505   BUN 29* 07/25/2011 0505   CREATININE 0.81 07/25/2011 0505   CALCIUM 8.2* 07/25/2011 0505   GFRNONAA 83* 07/25/2011 0505   GFRAA >90 07/25/2011 0505     Assessment and Plan:  Acute asthma exacerbation with tracheobronchitis>>slowly improving -?if he has component of VCD also -D7/7 Abx, currently on rocephin -changed solumedrol to prednisone>>taper over next two weeks -continue scheduled nebs -continue loratadine, montelukast  Allergic rhinitis -continue loratadine, montelukast  GERD -continue pepcid, protonix while inpatient, and then re-assess as outpt  Hx of CAD, HTN, PAF, Hyperlipidemia -continue norvasc, ASA, bisoprolol, clonidine, plavix -was on simvastatin as outpt>>change to atorvastatin due to possible interaction with amlodipine per pharmacy  OSA -continue nocturnal CPAP  Steroid induced hyperglycemia -SSI -CHO modified diet while on steroids  Hypokalemia -resolved  Disposition -d/c home 4/17 after dose of rocephin -will need outpt f/u with Dr. Maple Hudson or [name] in one week  Coralyn Helling, MD 07/26/2011, 9:01 AM Pager:  [phone]

## 2011-07-26 NOTE — Discharge Summary (Signed)
Physician Discharge Summary  Patient ID: [name]. MRN: [id number] DOB/AGE: 04/25/1930 78 y.o.  Admit date: 07/20/2011 Discharge date: 07/26/2011  Problem List Principal Problem:  *Extrinsic asthma with status asthmaticus Active Problems:  ALLERGIC RHINITIS  Extrinsic asthma  GERD  HPI: This is a 78 year old white male with severe persistent asthma. The patient has a long-standing history of multiple allergies. The patient has had at least 7 different encounters as an outpatient over the past 6 weeks with asthma flare. The patient received multiple rounds or corticosteroids and intensive neb treatments. Despite this the patient continues to worsen and [place] the patient was brought to the emergency room on 07/20/2011 for admission. Pulmonary was asked to admit this patient.  The patient denies any reflux symptoms, chest pain, fever, chills or sweats. The patient does admit to productive cough of thick brown material. The patient denies any chest pain it is analogous to his prior angina.  Hospital Course:  Antibiotics:  Rocephin 04/11 (tracheobronchitis)>>>4/17  Tests / Events:  4/11 CT sinus>>Small mucosal retention cyst versus small polyps in the anterior aspects of the maxillary sinuses bilaterally.  4/13 Add singulair  Subjective:  Decreased wheeze, chest congestion. Feels like he is getting better.  Physical Examination:  BP 134/66  Pulse 76  Temp(Src) 98.4 F (36.9 C) (Oral)  Resp 20  Ht 5\' 2"  (1.575 m)  Wt 152 lb (68.947 kg)  BMI 27.80 kg/m2  SpO2 97%  General - no distress  HEENT - no sinus tenderness  Cardiac - s1s2 regular  Chest - scattered rhonchi, no wheeze  Abd - soft, nontender  Ext - no edema  Neuro - normal strength  Psych - normal mood, behavior  CBC    Component  Value  Date/Time    WBC  5.0  07/21/2011 0505    WBC  7.7  06/03/2011 1446    RBC  4.69  07/21/2011 0505    RBC  4.45*  06/03/2011 1446    HGB  12.9*  07/21/2011 0505    HGB  12.2*   06/03/2011 1446    HCT  39.5  07/21/2011 0505    HCT  38.3*  06/03/2011 1446    PLT  196  07/21/2011 0505    MCV  84.2  07/21/2011 0505    MCV  86.1  06/03/2011 1446    MCH  27.5  07/21/2011 0505    MCH  27.4  06/03/2011 1446    MCHC  32.7  07/21/2011 0505    MCHC  31.9  06/03/2011 1446    RDW  15.1  07/21/2011 0505    LYMPHSABS  0.9  02/16/2011 0035    MONOABS  0.9  02/16/2011 0035    EOSABS  0.0  02/16/2011 0035    BASOSABS  0.0  02/16/2011 0035    BMET    Component  Value  Date/Time    NA  141  07/25/2011 0505    K  3.6  07/25/2011 0505    CL  102  07/25/2011 0505    CO2  32  07/25/2011 0505    GLUCOSE  167*  07/25/2011 0505    BUN  29*  07/25/2011 0505    CREATININE  0.81  07/25/2011 0505    CALCIUM  8.2*  07/25/2011 0505    GFRNONAA  83*  07/25/2011 0505    GFRAA  >90  07/25/2011 0505    Assessment and Plan:  Acute asthma exacerbation with tracheobronchitis>>slowly improving  -?if  he has component of VCD also  -D7/7 Abx, currently on rocephin  -changed solumedrol to prednisone>>taper over next two weeks  -continue scheduled nebs  -continue loratadine, montelukast   Allergic rhinitis  -continue loratadine, montelukast  GERD  -continue pepcid, protonix while inpatient, and then re-assess as outpt  Hx of CAD, HTN, PAF, Hyperlipidemia  -continue norvasc, ASA, bisoprolol, clonidine, plavix  -was on simvastatin as outpt>>change to atorvastatin due to possible interaction with amlodipine per pharmacy  OSA  -continue nocturnal CPAP  Steroid induced hyperglycemia  -SSI  -CHO modified diet while on steroids  Hypokalemia  -resolved  Disposition  -d/c home 4/17 after dose of rocephin  - outpt f/u with Dr. Maple Hudson or [name] in one week  -NOTE HE WAS GIVEN INSTRUCTIONS BY DR Sherene Sires WHILE IN HOSPITAL ON WHAT SOUNDS LIKE VCD. PLEASE HAVE DR Sherene Sires GIVE THE PATIENT THE RULES OF VCD.      Labs at discharge Lab Results  Component Value Date   CREATININE 0.81 07/25/2011   BUN 29* 07/25/2011     NA 141 07/25/2011   K 3.6 07/25/2011   CL 102 07/25/2011   CO2 32 07/25/2011   Lab Results  Component Value Date   WBC 5.0 07/21/2011   HGB 12.9* 07/21/2011   HCT 39.5 07/21/2011   MCV 84.2 07/21/2011   PLT 196 07/21/2011   Lab Results  Component Value Date   ALT 39 02/19/2011   AST 15 02/19/2011   ALKPHOS 104 02/19/2011   BILITOT 0.7 02/19/2011   Lab Results  Component Value Date   INR 1.00 12/16/2010   INR 1.00 07/03/2010   INR 0.94 06/26/2010    Current radiology studies No results found.  Disposition:  01-Home or Self Care  Discharge Orders    Future Appointments: Provider: Department: Dept Phone: Center:   08/03/2011 9:30 AM Julio Sicks, NP [facility] [phone] None   08/08/2011 11:15 AM Waymon Budge, MD [facility] [phone] None   10/18/2011 9:00 AM Newt Lukes, MD [facility] [phone] [facility]   01/03/2012 9:00 AM Waymon Budge, MD [facility] [phone] None     Future Orders Please Complete By Expires   Discharge patient        Medication List  As of 07/26/2011 12:12 PM   TAKE these medications         acidophilus Caps   Take 1 capsule by mouth daily. [facility] probiotics      albuterol 108 (90 BASE) MCG/ACT inhaler   Commonly known as: PROVENTIL HFA;VENTOLIN HFA   Inhale 2 puffs into the lungs every 6 (six) hours as needed for wheezing or shortness of breath.      amLODipine 5 MG tablet   Commonly known as: NORVASC   Take 5 mg by mouth daily.      aspirin 81 MG tablet   Take 81 mg by mouth daily.      bisoprolol 10 MG tablet   Commonly known as: ZEBETA   Take 1 tablet (10 mg total) by mouth daily.      cetirizine 10 MG tablet   Commonly known as: ZYRTEC   Take 10 mg by mouth daily.      cloNIDine 0.3 MG tablet   Commonly known as: CATAPRES   Take 0.3-0.6 mg by mouth See admin instructions. Take 1 tablet in morning and 2 tablets at night      clopidogrel 75 MG tablet   Commonly known as: PLAVIX   Take 1  tablet (75  mg total) by mouth daily.      famotidine 20 MG tablet   Commonly known as: PEPCID   Take 1 tablet (20 mg total) by mouth at bedtime.      ipratropium-albuterol 0.5-2.5 (3) MG/3ML Soln   Commonly known as: DUONEB   Take 3 mLs by nebulization every 6 (six) hours as needed.      latanoprost 0.005 % ophthalmic solution   Commonly known as: XALATAN   Place 1 drop into both eyes daily.      montelukast 10 MG tablet   Commonly known as: SINGULAIR   Take 1 tablet (10 mg total) by mouth at bedtime.      nitroGLYCERIN 0.4 MG SL tablet   Commonly known as: NITROSTAT   Place 0.4 mg under the tongue every 5 (five) minutes as needed. For chest pain      omeprazole 20 MG capsule   Commonly known as: PRILOSEC   Take 20 mg by mouth 2 (two) times daily.      potassium chloride SA 20 MEQ tablet   Commonly known as: K-DUR,KLOR-CON   Take 1 tablet (20 mEq total) by mouth 2 (two) times daily.      pravastatin 80 MG tablet   Commonly known as: PRAVACHOL   take 1 tablet by mouth every evening      predniSONE 10 MG tablet   Commonly known as: DELTASONE   3tabs x  3 days then 2 tabs daily till you see tammy parret.           Follow-up Information    Follow up with PARRETT,TAMMY, NP on 08/03/2011. (9:30 am with Tammy)    Contact information:   [facility] [address] [place] [address] [phone]          Discharged Condition: fair  Signed: Brett Canales Minor ACNP [facility] Pager [phone] till 3 pm If no answer page [phone] 07/26/2011, 12:12 PM   Coralyn Helling, MD 07/26/2011, 12:40 PM

## 2011-07-26 NOTE — Discharge Instructions (Signed)
Remember to do pursed lip breathing when you are short of breath. Ask [name] and Dr. Maple Hudson about Dr. Thurston Hole rule's  for VCD (vocal cord dysfunction) .

## 2011-07-27 ENCOUNTER — Other Ambulatory Visit: Payer: Self-pay | Admitting: Physician Assistant

## 2011-07-27 NOTE — Telephone Encounter (Signed)
Pt needs to make appt to see Dr. Antoine Poche

## 2011-08-03 ENCOUNTER — Other Ambulatory Visit: Payer: Self-pay | Admitting: Physician Assistant

## 2011-08-03 ENCOUNTER — Inpatient Hospital Stay: Payer: Medicare Other | Admitting: Adult Health

## 2011-08-07 ENCOUNTER — Ambulatory Visit (INDEPENDENT_AMBULATORY_CARE_PROVIDER_SITE_OTHER): Payer: Medicare Other | Admitting: Internal Medicine

## 2011-08-07 ENCOUNTER — Telehealth: Payer: Self-pay | Admitting: Internal Medicine

## 2011-08-07 ENCOUNTER — Encounter: Payer: Self-pay | Admitting: Internal Medicine

## 2011-08-07 VITALS — BP 100/80 | HR 80 | Ht 62.0 in | Wt 152.2 lb

## 2011-08-07 DIAGNOSIS — R159 Full incontinence of feces: Secondary | ICD-10-CM

## 2011-08-07 DIAGNOSIS — R197 Diarrhea, unspecified: Secondary | ICD-10-CM

## 2011-08-07 MED ORDER — METRONIDAZOLE 250 MG PO TABS: 250.0000 mg | ORAL_TABLET | Freq: Four times a day (QID) | ORAL | Status: AC

## 2011-08-07 NOTE — Telephone Encounter (Signed)
Pt has been having a lot of diarrhea. Pt was recently in the hospital and received IV antibiotics. Requesting to be seen today. Pt scheduled to see Dr. Marina Goodell today at 3pm. Pt aware of appt date and time.

## 2011-08-07 NOTE — Progress Notes (Signed)
HISTORY OF PRESENT ILLNESS:  [name]. is a 78 y.o. male with MULTIPLE SIGNIFICANT medical problems as listed below. He is accompanied by his wife. He was last evaluated 02/13/2011 in followup regarding a 6 month history of diarrhea. Symptoms improved after a course of metronidazole. Shortly after that office visit, he presented to the hospital with biliary pancreatitis and subsequently underwent laparoscopic cholecystectomy. Postcholecystectomy he has had intermittent problems with loose stools, urgency, and incontinence. He has been in the hospital recently with an exacerbation of his pulmonary disease. He has been on antibiotics. He remains on prednisone chronically. Since leaving the hospital about a week ago or so, he has had worsening diarrhea. Over the past few days reports greater than 10 loose stools per day. He continues with urgency and incontinence. No fevers or abdominal pain. No bleeding. No weight loss.  REVIEW OF SYSTEMS:  All non-GI ROS negative except for sinus an allergy couple, anxiety, depression, cough, fatigue, hearing problems, shortness of breath, nosebleeds, insomnia, ankle swelling  Past Medical History  Diagnosis Date  . CORONARY HEART DISEASE     a. s/p CABG;  b.  cath 06/27/10: S-Dx occluded, S-PDA 80-90% (tx with PCI); S-OM ok, L-LAD ok;  EF 65-70%  c.  s/p Promus DES to S-PDA 06/2010;   d. Myoview 8/12: low risk  . FIBRILLATION, ATRIAL     post op; ?documented during hosp. 06/2010  . SLEEP APNEA 09/2001    NPSG AHI 22/HR  . ALLERGIC RHINITIS   . ASTHMA   . Depression   . GERD     with HH, hx esophageal stricture  . DYSLIPIDEMIA   . HYPERTENSION     Echo 3/12: EF 55-60%; mod LVH; mild AS/AI; LAE; PASP 38; mild pulmo HTN  . Personal history of alcoholism   . Diverticulosis   . Benign liver cyst   . Skin cancer     L forearm  . Cataract     surgery to both eyes    Past Surgical History  Procedure Date  . Coronary artery bypass graft 02/2007  .  Hernia repair 12/03/07  . Tonsillectomy   . Coronary angioplasty with stent placement 07/2010  . Cataract extraction, bilateral 2007  . Hernia repair unsure ?60's  . Cholecystectomy 02/21/2011    Procedure: LAPAROSCOPIC CHOLECYSTECTOMY WITH INTRAOPERATIVE CHOLANGIOGRAM;  Surgeon: Valarie Merino, MD;  Location: [facility];  Service: General;  Laterality: N/A;    Social History [name].  reports that he quit smoking about 32 years ago. His smoking use included Cigarettes. He has a 120 pack-year smoking history. He does not have any smokeless tobacco history on file. He reports that he does not drink alcohol or use illicit drugs.  family history includes Asthma in his sister; COPD in his sister; Emphysema in his sister; and Hypertension in his mother and sister.  Allergies  Allergen Reactions  . Codeine Nausea And Vomiting       PHYSICAL EXAMINATION:  Vital signs: BP 100/80  Pulse 80  Ht 5\' 2"  (1.575 m)  Wt 152 lb 4 oz (69.06 kg)  BMI 27.85 kg/m2 General:chronically ill-appearing, Well-developed, well-nourished, no acute distress HEENT: Sclerae are anicteric, conjunctiva pink. Oral mucosa intact Lungs: Clear Heart: Regular Abdomen: soft, nontender, nondistended, no obvious ascites, no peritoneal signs, normal bowel sounds. No organomegaly. Extremities: No edema, multiple ecchymoses on the extremities Psychiatric: alert and oriented x3. Cooperative   ASSESSMENT:  #1. Worsening problems with diarrhea. Rule out Clostridium difficile  associated diarrhea, particularly given recent antibiotic therapies   PLAN:  #1 Obtain stool for Clostridium difficile by PCR #2. Prescribe metronidazole 250 mg 4 times a day x2 weeks #3. Routine office followup in 4 weeks. Contact the office in the interim for persisting or worsening symptoms despite therapy #4. Ongoing general medical care with Dr. Felicity Coyer

## 2011-08-07 NOTE — Patient Instructions (Addendum)
Your physician has requested that you go to the basement for lab work before leaving today  We have sent the following medications to your pharmacy for you to pick up at your convenience: Metronidazole  Please follow up in 4 weeks

## 2011-08-08 ENCOUNTER — Other Ambulatory Visit: Payer: Medicare Other

## 2011-08-08 ENCOUNTER — Encounter: Payer: Self-pay | Admitting: Internal Medicine

## 2011-08-08 ENCOUNTER — Ambulatory Visit (INDEPENDENT_AMBULATORY_CARE_PROVIDER_SITE_OTHER): Payer: Medicare Other | Admitting: Internal Medicine

## 2011-08-08 DIAGNOSIS — K219 Gastro-esophageal reflux disease without esophagitis: Secondary | ICD-10-CM

## 2011-08-08 DIAGNOSIS — R197 Diarrhea, unspecified: Secondary | ICD-10-CM

## 2011-08-08 DIAGNOSIS — J45909 Unspecified asthma, uncomplicated: Secondary | ICD-10-CM

## 2011-08-08 MED ORDER — LORAZEPAM 0.5 MG PO TABS: ORAL_TABLET | ORAL | Status: DC

## 2011-08-08 MED ORDER — INDACATEROL MALEATE 75 MCG IN CAPS: 1.0000 | ORAL_CAPSULE | RESPIRATORY_TRACT | Status: DC

## 2011-08-08 NOTE — Progress Notes (Signed)
Patient ID: [name], male    DOB: 09-15-1930, 78 y.o.   MRN: [id number]  HPI 11/16/10- 36 year old man followed for severe chronic asthma, sleep apnea, allergic rhinitis, complicated by HBP CAD history of atrial fibrillation, GERD Last here May 20, 2010 CPAP 10 all night every night- helps sleep. Asks refill Provigil to help with long drives.  Allergy vaccine GO definitely helps- he is convinced.  He notes no asthma and no prednisone since last November- credits frequent handwashing, avoiding people with colds and the allergy vaccine. No new concerns. Continues cardiac rehab.   03/03/11-  33 year old man followed for severe chronic asthma, sleep apnea, allergic rhinitis, complicated by HBP CAD history of atrial fibrillation, GERD. Wife here. He recently had his fourth hospital stay since March, for gallbladder pain, cholecystectomy, cardiac stent. Cholecystectomy was 2 weeks ago. Through these he had shortness of breath and cough which has persisted and keep him awake. His nose runs.  07/03/11- 20 year old man followed for severe chronic asthma, sleep apnea, allergic rhinitis, complicated by HBP, CAD, history of atrial fibrillation, GERD. Wife here. Since last here has been to urgent care in emergency room 4 times for exacerbations of asthma. On prednisone 10 mg/day since February from Dr Felicity Coyer. Definitely having reflux events and we discussed how that may be the significant issue. Denies sinus infection. Had chest x-rays in January and February which she was told were negative. He asks to try increasing his CPAP pressure because he doesn't feel he is sleeping quite well enough. We discussed this.  07/18/11- 60 year old man followed for severe chronic asthma, sleep apnea, allergic rhinitis, complicated by HBP, CAD, history of atrial fibrillation, GERD. Wife here. Seen by Dr Daub-Increased SOB and wheezing; was put on prednisone to help keep patient out of hospital; still not feeling  much better. Has been on prednisone 60 mg daily for past 4 days but still significant shortness of breath with exertion and wheeze. No excess 2 chest x-rays have been clear and EKG "knot heart". Using home nebulizer with DuoNeb 4 times daily plus his rescue inhaler at least twice per day. Nothing seems infected. He has not recognized heartburn although that has been an issue in the past. Denies chest pain, fever, ankle edema or palpitation. Some nasal congestion without much sneezing. Has been indoors without much exposure to pollen.  08/08/11- 16 year old man followed for severe chronic asthma, sleep apnea, allergic rhinitis, complicated by HBP, CAD, history of atrial fibrillation, GERD. Wife here. Hospitalized April 11-17 with acute exacerbation of his chronic fixed asthma. Anxiety and reflux are thought to be important. He says he is "doing great" now. He feels an anxiety medication would help and we discussed this. Currently on prednisone 20 mg daily.  Review of Systems-see HPI Constitutional:   No-   weight loss, night sweats, fevers, chills, fatigue, lassitude. HEENT:   No-  headaches, difficulty swallowing, tooth/dental problems, sore throat,       No-  sneezing, itching, ear ache, nasal congestion, +post nasal drip,  CV:  No-   chest pain, orthopnea, PND, swelling in lower extremities, anasarca, dizziness, palpitations Resp: +  shortness of breath with exertion or at rest.              No-   productive cough,  +non-productive cough,  No-  coughing up of blood.              No-   change in color of mucus. +wheezing.   Skin: No-  rash or lesions. GI:  No-   heartburn, indigestion, abdominal pain, nausea, vomiting, GU: . MS:  No-   joint pain or swelling.   Neuro- nothing unusual:  Psych:  No- change in mood or affect. No depression or anxiety.  No memory loss.  Objective:   Physical Exam General- Alert, Oriented, Affect-appropriate, Distress- none acute Skin- rash-none, lesions- none,  excoriation- none   [place] t[place] Lymphadenopathy- none Head- atraumatic            Eyes- Gross vision intact, PERRLA, conjunctivae clear secretions            Ears- Hearing, canals- hearing aid            Nose- Clear, No- Septal dev, mucus, polyps, erosion, perforation             Throat- Mallampati II , mucosa clear , drainage- none, tonsils- atrophic Neck- flexible , trachea midline, no stridor , thyroid nl, carotid no bruit Chest - symmetrical excursion , unlabored           Heart/CV- RRR , no murmur , no gallop  , no rub, nl s1 s2                           - JVD- none , edema- none, stasis changes- none, varices- none           Lung- tripod posture, cough- none , dullness-none, rub- none. Short sentences.           Chest wall-  Abd-  Br/ Gen/ Rectal- Not done, not indicated Extrem- cyanosis- none, clubbing, none, atrophy- none, strength- nl Neuro- grossly intact to observation

## 2011-08-08 NOTE — Patient Instructions (Addendum)
Stop Claritin/ loratadine since you are using the antihistamine zyrtec when needed.  Prednisone - 15 mg daily x 7 days, then 10 mg daily         1/2 x 20 mg= 10 mg,    1/4 x 20 mg = 5 mg  Script for lorazepam to use when needed for nerves  Sample / script Arcapta  24 hour bronchodilator inhalation capsule..   You can chose to use this or skip it day by day. It may help you stay more level. You can still use the nebulizer or rescue inhaler occasionally if needed,  But they will stack up as stimulants on top of Arcapta.  Acid blockers- I think you have pepcid, prilosec and protonix. They are all the same idea. Pick one to use twice daily, before breakfast and supper. You can take one of the others at bedtime.

## 2011-08-09 LAB — CLOSTRIDIUM DIFFICILE BY PCR: Toxigenic C. Difficile by PCR: NOT DETECTED

## 2011-08-12 NOTE — Assessment & Plan Note (Signed)
We reviewed use of acid blocker medications and made recommendation.

## 2011-08-12 NOTE — Assessment & Plan Note (Signed)
Significantly improved after hospital stay. Plan-decrease prednisone to 15 mg daily for one week then 10 mg daily. We discussed steroid withdrawal. Try Arcapta. Lorazepam for anxiety.

## 2011-08-25 ENCOUNTER — Other Ambulatory Visit: Payer: Self-pay | Admitting: Internal Medicine

## 2011-08-27 ENCOUNTER — Other Ambulatory Visit: Payer: Self-pay | Admitting: Internal Medicine

## 2011-08-29 ENCOUNTER — Ambulatory Visit (INDEPENDENT_AMBULATORY_CARE_PROVIDER_SITE_OTHER): Payer: Medicare Other

## 2011-08-29 DIAGNOSIS — J309 Allergic rhinitis, unspecified: Secondary | ICD-10-CM

## 2011-08-30 ENCOUNTER — Encounter: Payer: Self-pay | Admitting: Internal Medicine

## 2011-08-30 ENCOUNTER — Other Ambulatory Visit (INDEPENDENT_AMBULATORY_CARE_PROVIDER_SITE_OTHER): Payer: Medicare Other

## 2011-08-30 ENCOUNTER — Ambulatory Visit (INDEPENDENT_AMBULATORY_CARE_PROVIDER_SITE_OTHER): Payer: Medicare Other | Admitting: Internal Medicine

## 2011-08-30 VITALS — BP 146/68 | HR 70 | Ht 62.0 in | Wt 159.0 lb

## 2011-08-30 DIAGNOSIS — R5383 Other fatigue: Secondary | ICD-10-CM

## 2011-08-30 DIAGNOSIS — R159 Full incontinence of feces: Secondary | ICD-10-CM

## 2011-08-30 DIAGNOSIS — R197 Diarrhea, unspecified: Secondary | ICD-10-CM

## 2011-08-30 LAB — CBC WITH DIFFERENTIAL/PLATELET
Basophils Absolute: 0 10*3/uL (ref 0.0–0.1)
Eosinophils Absolute: 0 10*3/uL (ref 0.0–0.7)
Hemoglobin: 12.3 g/dL — ABNORMAL LOW (ref 13.0–17.0)
Lymphocytes Relative: 15 % (ref 12.0–46.0)
Lymphs Abs: 1.5 10*3/uL (ref 0.7–4.0)
MCHC: 32.7 g/dL (ref 30.0–36.0)
Neutro Abs: 7.8 10*3/uL — ABNORMAL HIGH (ref 1.4–7.7)
Platelets: 209 10*3/uL (ref 150.0–400.0)
RDW: 19.1 % — ABNORMAL HIGH (ref 11.5–14.6)

## 2011-08-30 LAB — COMPREHENSIVE METABOLIC PANEL
ALT: 26 U/L (ref 0–53)
AST: 21 U/L (ref 0–37)
Creatinine, Ser: 0.7 mg/dL (ref 0.4–1.5)
Total Bilirubin: 0.6 mg/dL (ref 0.3–1.2)

## 2011-08-30 MED ORDER — LOPERAMIDE HCL 2 MG PO TABS: ORAL_TABLET | ORAL | Status: DC

## 2011-08-30 MED ORDER — CHOLESTYRAMINE 4 G PO PACK: PACK | ORAL | Status: DC

## 2011-08-30 NOTE — Progress Notes (Signed)
HISTORY OF PRESENT ILLNESS:  [name]. is a 78 y.o. male with multiple significant medical problems as listed below. He presents today regarding ongoing management of diarrhea. He was last seen 08/07/2011. He is accompanied by his wife, who contributes to the history. Testing for Clostridium difficile PCRwas negative. He completed a two-week course of metronidazole without improvement in symptoms. He states that his diarrhea is severe. He reports 5-10 loose stools daily. Occasionally at night. He is having fecal incontinence which inhibits his social activities. Problems have been going on for several months. He has used Imodium very sparingly. It may help some.. No nausea, vomiting, abdominal pain, or bleeding. No weight loss. Actually weight gain. He is status post recent cholecystectomy after an episode of biliary pancreatitis. Meals seemed to aggravate symptoms somewhat. No fevers.has multiple chronic medical problems such as COPD (still on prednisone), coronary artery disease, and hypertension are stable. His other significant complaint is fatigue. Worst toward the end of the day.Marland Kitchen  REVIEW OF SYSTEMS:  All non-GI ROS negative except for fatigue, exertional shortness of breath, arthritis,  Past Medical History  Diagnosis Date  . CORONARY HEART DISEASE     a. s/p CABG;  b.  cath 06/27/10: S-Dx occluded, S-PDA 80-90% (tx with PCI); S-OM ok, L-LAD ok;  EF 65-70%  c.  s/p Promus DES to S-PDA 06/2010;   d. Myoview 8/12: low risk  . FIBRILLATION, ATRIAL     post op; ?documented during hosp. 06/2010  . SLEEP APNEA 09/2001    NPSG AHI 22/HR  . ALLERGIC RHINITIS   . ASTHMA   . Depression   . GERD     with HH, hx esophageal stricture  . DYSLIPIDEMIA   . HYPERTENSION     Echo 3/12: EF 55-60%; mod LVH; mild AS/AI; LAE; PASP 38; mild pulmo HTN  . Personal history of alcoholism   . Diverticulosis   . Benign liver cyst   . Skin cancer     L forearm  . Cataract     surgery to both eyes     Past Surgical History  Procedure Date  . Coronary artery bypass graft 02/2007  . Hernia repair 12/03/07  . Tonsillectomy   . Coronary angioplasty with stent placement 07/2010  . Cataract extraction, bilateral 2007  . Hernia repair unsure ?60's  . Cholecystectomy 02/21/2011    Procedure: LAPAROSCOPIC CHOLECYSTECTOMY WITH INTRAOPERATIVE CHOLANGIOGRAM;  Surgeon: Valarie Merino, MD;  Location: [facility];  Service: General;  Laterality: N/A;    Social History [name].  reports that he quit smoking about 32 years ago. His smoking use included Cigarettes. He has a 120 pack-year smoking history. He does not have any smokeless tobacco history on file. He reports that he does not drink alcohol or use illicit drugs.  family history includes Asthma in his sister; COPD in his sister; Emphysema in his sister; and Hypertension in his mother and sister.  Allergies  Allergen Reactions  . Codeine Nausea And Vomiting       PHYSICAL EXAMINATION: Vital signs: BP 146/68  Pulse 70  Ht 5\' 2"  (1.575 m)  Wt 159 lb (72.122 kg)  BMI 29.08 kg/m2  Constitutional: chronically ill-appearing, no acute distress Psychiatric: alert and oriented x3, cooperative Eyes: extraocular movements intact, anicteric, conjunctiva pink Mouth: oral pharynx moist, no lesions Neck: supple no lymphadenopathy Cardiovascular: heart regular rate and rhythm, no murmur Lungs: clear to auscultation bilaterally Abdomen: soft,obese, nontender, nondistended, no obvious ascites, no peritoneal signs,  normal bowel sounds, no organomegaly Rectal:omitted Extremities: no lower extremity edema bilaterally Skin: no lesions on visible extremities, except ecchymoses Neuro: No focal deficits. No asterixis.     ASSESSMENT:  #1. Diarrhea. Continues without improvement. Possibly postcholecystectomy, though this seems out of proportion. Could be microscopic colitis. #2. Fecal incontinence. Continues without improvement. Inhibited  lifestyle #3. Fatigue. New problem. Rule out electrolyte disturbance #4. Multiple chronic medical problems. Seemingly stable   PLAN:  #1. Prescribed Questran 4 g once or twice daily. Did not take within 2 hours of other medications #2. Increase Imodium, up to 8 per day. Titrate to need. Watch for constipation #3. Protective undergarments for incontinence #4. CBC and comprehensive metabolic panel to further evaluate fatigue and rule out electrolyte disturbance #5. Office followup in 4 weeks. Consider colonoscopy with biopsies if symptoms persist despite the above measures #6. Ongoing general medical care with Dr. Bayard Hugger

## 2011-08-30 NOTE — Patient Instructions (Signed)
We have sent the following medications to your pharmacy for you to pick up at your convenience:  Questran  You may take Imodium, titrate to your need.  Please follow up with Dr. Marina Goodell in 4 weeks

## 2011-09-03 ENCOUNTER — Other Ambulatory Visit: Payer: Self-pay | Admitting: Internal Medicine

## 2011-09-06 NOTE — Telephone Encounter (Signed)
Per CY-gave verbal to refill x 5. Rx called to pharmacy.

## 2011-09-13 ENCOUNTER — Ambulatory Visit: Payer: Medicare Other | Admitting: Internal Medicine

## 2011-09-27 ENCOUNTER — Ambulatory Visit (INDEPENDENT_AMBULATORY_CARE_PROVIDER_SITE_OTHER): Payer: Medicare Other | Admitting: Cardiology

## 2011-09-27 ENCOUNTER — Encounter: Payer: Self-pay | Admitting: Cardiology

## 2011-09-27 VITALS — BP 121/70 | HR 82 | Ht 62.0 in | Wt 161.1 lb

## 2011-09-27 DIAGNOSIS — E785 Hyperlipidemia, unspecified: Secondary | ICD-10-CM

## 2011-09-27 DIAGNOSIS — R0989 Other specified symptoms and signs involving the circulatory and respiratory systems: Secondary | ICD-10-CM

## 2011-09-27 DIAGNOSIS — R0602 Shortness of breath: Secondary | ICD-10-CM

## 2011-09-27 DIAGNOSIS — Z79899 Other long term (current) drug therapy: Secondary | ICD-10-CM

## 2011-09-27 NOTE — Assessment & Plan Note (Signed)
I will order carotid Dopplers. 

## 2011-09-27 NOTE — Progress Notes (Signed)
HPI The patient presents for followup after PCI of the vein graft earlier last year.   Since I last saw him he was hospitalized in April with asthma. He had a hospitalization last November as well. He says he's not been breathing is well since these hospitalizations. He'll get short of breath particularly when he gets outside to try to do some yard work. He's not describing any chest pressure, neck or arm discomfort. However, he never had chest pain as an anginal equivalent. He's not describing PND or orthopnea. He doesn't notice palpitations. He does have occasional ankle swelling. He did have his catheterization last year as described below followed by stress testing in August.  Allergies  Allergen Reactions  . Codeine Nausea And Vomiting    Current Outpatient Prescriptions  Medication Sig Dispense Refill  . acidophilus (RISAQUAD) CAPS Take 1 capsule by mouth daily. [facility] probiotics      . albuterol (PROVENTIL HFA;VENTOLIN HFA) 108 (90 BASE) MCG/ACT inhaler Inhale 2 puffs into the lungs as needed.      Marland Kitchen amLODipine (NORVASC) 5 MG tablet Take 5 mg by mouth daily.      Marland Kitchen aspirin 81 MG tablet Take 81 mg by mouth daily.       . bisoprolol (ZEBETA) 10 MG tablet Take 1 tablet (10 mg total) by mouth daily. Please call office to make appt to see Dr. Antoine Poche  30 tablet  2  . cetirizine (ZYRTEC) 10 MG tablet Take 10 mg by mouth daily.        . cloNIDine (CATAPRES) 0.3 MG tablet Take 0.3-0.6 mg by mouth See admin instructions. Take 1 tablet in morning and 2 tablets at night      . clopidogrel (PLAVIX) 75 MG tablet take 1 tablet by mouth once daily  30 tablet  4  . famotidine (PEPCID) 20 MG tablet take 1 tablet by mouth at bedtime  30 tablet  5  . KLOR-CON M20 20 MEQ tablet take 1 tablet by mouth twice a day  60 tablet  4  . latanoprost (XALATAN) 0.005 % ophthalmic solution Place 1 drop into both eyes daily.       Marland Kitchen loperamide (IMODIUM A-D) 2 MG tablet Take as directed, titrate to need  30  tablet  0  . LORazepam (ATIVAN) 0.5 MG tablet take 1 tablet by mouth twice a day if needed for anxiety  60 tablet  5  . montelukast (SINGULAIR) 10 MG tablet take 1 tablet by mouth at bedtime  30 tablet  5  . nitroGLYCERIN (NITROSTAT) 0.4 MG SL tablet Place 0.4 mg under the tongue every 5 (five) minutes as needed. For chest pain      . pravastatin (PRAVACHOL) 80 MG tablet take 1 tablet by mouth every evening  30 tablet  3  . cholestyramine (QUESTRAN) 4 G packet Take 4 grams (one packet)  in water once or twice a day  60 each  6  . ipratropium-albuterol (DUONEB) 0.5-2.5 (3) MG/3ML SOLN Take 3 mLs by nebulization every 6 (six) hours as needed.  240 mL  prn  . pantoprazole (PROTONIX) 40 MG tablet Take 40 mg by mouth daily.      . predniSONE (DELTASONE) 10 MG tablet 1.5 tablets daily        Past Medical History  Diagnosis Date  . CORONARY HEART DISEASE     a. s/p CABG;  b.  cath 06/27/10: S-Dx occluded, S-PDA 80-90% (tx with PCI); S-OM ok, L-LAD ok;  EF  65-70%  c.  s/p Promus DES to S-PDA 06/2010;   d. Myoview 8/12: low risk  . FIBRILLATION, ATRIAL     post op; ?documented during hosp. 06/2010  . SLEEP APNEA 09/2001    NPSG AHI 22/HR  . ALLERGIC RHINITIS   . ASTHMA   . Depression   . GERD     with HH, hx esophageal stricture  . DYSLIPIDEMIA   . HYPERTENSION     Echo 3/12: EF 55-60%; mod LVH; mild AS/AI; LAE; PASP 38; mild pulmo HTN  . Personal history of alcoholism   . Diverticulosis   . Benign liver cyst   . Skin cancer     L forearm  . Cataract     surgery to both eyes    Past Surgical History  Procedure Date  . Coronary artery bypass graft 02/2007  . Hernia repair 12/03/07  . Tonsillectomy   . Coronary angioplasty with stent placement 07/2010  . Cataract extraction, bilateral 2007  . Hernia repair unsure ?60's  . Cholecystectomy 02/21/2011    Procedure: LAPAROSCOPIC CHOLECYSTECTOMY WITH INTRAOPERATIVE CHOLANGIOGRAM;  Surgeon: Valarie Merino, MD;  Location: [facility];  Service:  General;  Laterality: N/A;    ROS:  As stated in the HPI and negative for all other systems.  PHYSICAL EXAM BP 121/70  Pulse 82  Ht 5\' 2"  (1.575 m)  Wt 161 lb 1.9 oz (73.084 kg)  BMI 29.47 kg/m2 GENERAL:  Chronically ill appearing but in no acute distress HEENT:  Pupils equal round and reactive, fundi not visualized, oral mucosa unremarkable, dentures NECK:  No jugular venous distention, waveform within normal limits, carotid upstroke brisk and symmetric, left carotid bruits, no thyromegaly LYMPHATICS:  No cervical, inguinal adenopathy LUNGS:  Clear to auscultation bilaterally BACK:  No CVA tenderness CHEST:  Well healed sternotomy scar. HEART:  PMI not displaced or sustained,S1 and S2 within normal limits, no S3, no S4, no clicks, no rubs, soft apical, right upper sterna border early peaking murmur ABD:  Flat, positive bowel sounds normal in frequency in pitch, no bruits, no rebound, no guarding, no midline pulsatile mass, no hepatomegaly, no splenomegaly, healed abdominal scars. EXT:  2 plus pulses throughout, mild bilateral edema, no cyanosis no clubbing SKIN:  No rashes no nodules, multiple bruises NEURO:  Cranial nerves II through XII grossly intact, motor grossly intact throughout PSYCH:  Cognitively intact, oriented to person place and time   ASSESSMENT AND PLAN

## 2011-09-27 NOTE — Patient Instructions (Addendum)
Please stop your Plavix. Continue all other medications as listed  Please have fasting blood work in one week (lipid, liver and BNP)  Your physician has requested that you have a carotid duplex. This test is an ultrasound of the carotid arteries in your neck. It looks at blood flow through these arteries that supply the brain with blood. Allow one hour for this exam. There are no restrictions or special instructions.  Follow up in 6 months with Dr Antoine Poche.  You will receive a letter in the mail 2 months before you are due.  Please call us when you receive this letter to schedule your follow up appointment.

## 2011-09-27 NOTE — Assessment & Plan Note (Signed)
He will get a lipid profile next week fasting with a goal LDL less than 100 and HDL greater than 40.

## 2011-09-27 NOTE — Assessment & Plan Note (Addendum)
I don't strongly suspect obstructive coronary disease as an etiology for current complaints. However, if after he sees Dr. Maple Hudson is thought that ischemia could be contributing to ongoing dyspnea I be happy to reevaluate and consider further stress testing.  He can stop his Plavix.  Of note to further evaluate his shortness of breath I will get a BNP level.

## 2011-10-04 ENCOUNTER — Other Ambulatory Visit (INDEPENDENT_AMBULATORY_CARE_PROVIDER_SITE_OTHER): Payer: Medicare Other

## 2011-10-04 ENCOUNTER — Encounter: Payer: Self-pay | Admitting: Internal Medicine

## 2011-10-04 ENCOUNTER — Ambulatory Visit (INDEPENDENT_AMBULATORY_CARE_PROVIDER_SITE_OTHER): Payer: Medicare Other | Admitting: Internal Medicine

## 2011-10-04 VITALS — BP 124/78 | HR 68 | Ht 62.0 in | Wt 162.6 lb

## 2011-10-04 VITALS — BP 124/76 | HR 76 | Ht 62.0 in | Wt 161.6 lb

## 2011-10-04 DIAGNOSIS — R197 Diarrhea, unspecified: Secondary | ICD-10-CM

## 2011-10-04 DIAGNOSIS — R0609 Other forms of dyspnea: Secondary | ICD-10-CM

## 2011-10-04 DIAGNOSIS — K7689 Other specified diseases of liver: Secondary | ICD-10-CM

## 2011-10-04 DIAGNOSIS — R0600 Dyspnea, unspecified: Secondary | ICD-10-CM

## 2011-10-04 DIAGNOSIS — J309 Allergic rhinitis, unspecified: Secondary | ICD-10-CM

## 2011-10-04 DIAGNOSIS — J45909 Unspecified asthma, uncomplicated: Secondary | ICD-10-CM

## 2011-10-04 DIAGNOSIS — R0989 Other specified symptoms and signs involving the circulatory and respiratory systems: Secondary | ICD-10-CM

## 2011-10-04 DIAGNOSIS — K219 Gastro-esophageal reflux disease without esophagitis: Secondary | ICD-10-CM

## 2011-10-04 DIAGNOSIS — J45902 Unspecified asthma with status asthmaticus: Secondary | ICD-10-CM

## 2011-10-04 DIAGNOSIS — J45998 Other asthma: Secondary | ICD-10-CM

## 2011-10-04 DIAGNOSIS — G4733 Obstructive sleep apnea (adult) (pediatric): Secondary | ICD-10-CM

## 2011-10-04 LAB — HEPATIC FUNCTION PANEL
ALT: 17 U/L (ref 0–53)
AST: 21 U/L (ref 0–37)
Alkaline Phosphatase: 60 U/L (ref 39–117)
Bilirubin, Direct: 0.1 mg/dL (ref 0.0–0.3)
Total Protein: 7.4 g/dL (ref 6.0–8.3)

## 2011-10-04 LAB — LIPID PANEL: Total CHOL/HDL Ratio: 3

## 2011-10-04 NOTE — Progress Notes (Signed)
HISTORY OF PRESENT ILLNESS:  Stephen Goodall. is a 78 y.o. male with multiple significant medical problems as listed below. He presents today regarding ongoing management of diarrhea. He was last seen 08/30/2011. See that dictation for details. He is again accompanied by his wife. At the time of his last visit, he was prescribed Questran 4 g once or twice daily. He has been taking this once daily. He is pleased to report almost immediate resolution of his diarrhea. He continues to take this once daily. He is careful not to take it within 2 hours of other medications. He is not requiring other antidiarrheals. No incontinence. His weight has been stable. Multiple chronic medical problems remain stable.  REVIEW OF SYSTEMS:  All non-GI ROS negative except for sinus and allergy trouble, anxiety, fatigue, shortness of breath with exertion, ankle edema intermittently  Past Medical History  Diagnosis Date  . CORONARY HEART DISEASE     a. s/p CABG;  b.  cath 06/27/10: S-Dx occluded, S-PDA 80-90% (tx with PCI); S-OM ok, L-LAD ok;  EF 65-70%  c.  s/p Promus DES to S-PDA 06/2010;   d. Myoview 8/12: low risk  . FIBRILLATION, ATRIAL     post op; ?documented during hosp. 06/2010  . SLEEP APNEA 09/2001    NPSG AHI 22/HR  . ALLERGIC RHINITIS   . ASTHMA   . Depression   . GERD     with HH, hx esophageal stricture  . DYSLIPIDEMIA   . HYPERTENSION     Echo 3/12: EF 55-60%; mod LVH; mild AS/AI; LAE; PASP 38; mild pulmo HTN  . Personal history of alcoholism   . Diverticulosis   . Benign liver cyst   . Skin cancer     L forearm  . Cataract     surgery to both eyes  . Esophageal stricture   . Hiatal hernia     Past Surgical History  Procedure Date  . Coronary artery bypass graft 02/2007  . Hernia repair 12/03/07  . Tonsillectomy   . Coronary angioplasty with stent placement 07/2010  . Cataract extraction, bilateral 2007  . Hernia repair unsure ?60's  . Cholecystectomy 02/21/2011    Procedure:  LAPAROSCOPIC CHOLECYSTECTOMY WITH INTRAOPERATIVE CHOLANGIOGRAM;  Surgeon: Valarie Merino, MD;  Location: [facility];  Service: General;  Laterality: N/A;    Social History [name].  reports that he quit smoking about 32 years ago. His smoking use included Cigarettes. He has a 120 pack-year smoking history. He has never used smokeless tobacco. He reports that he does not drink alcohol or use illicit drugs.  family history includes Asthma in his sister; COPD in his sister; Emphysema in his sister; Heart disease in his brother, father, and mother; and Hypertension in his mother and sister.  There is no history of Colon cancer.  Allergies  Allergen Reactions  . Codeine Nausea And Vomiting       PHYSICAL EXAMINATION: Vital signs: BP 124/76  Pulse 76  Ht 5\' 2"  (1.575 m)  Wt 161 lb 9.6 oz (73.301 kg)  BMI 29.56 kg/m2 General:currently ill-appearing but Well-developed, well-nourished, no acute distress HEENT: Sclerae are anicteric, conjunctiva pink. Oral mucosa intact Lungs: Clear Heart: Regular Abdomen: soft, nontender, nondistended, no obvious ascites, no peritoneal signs, normal bowel sounds. No organomegaly. Extremities: trace ankle edema Psychiatric: alert and oriented x3. Cooperative    ASSESSMENT:  #1. Recent problems with diarrhea. Significant response to Questran. Suspect postcholecystectomy bile salt induced type diarrhea.  PLAN:  #  1. Continue Questran. Titrated to need #2. GI followup as needed #3. Ongoing general medical care with Dr. Bayard Hugger

## 2011-10-04 NOTE — Patient Instructions (Addendum)
Ok to try the nebulizer 3-4 times per day and see how that affects your sense of increased shortness of breath lately  Over use of these stimulating meds may run up your heart rhythm  Order- BNP , liver profile, lipid panel

## 2011-10-04 NOTE — Progress Notes (Signed)
Patient ID: [name], male    DOB: 1930-07-08, 78 y.o.   MRN: [id number]  HPI 11/16/10- 47 year old man followed for severe chronic asthma, sleep apnea, allergic rhinitis, complicated by HBP CAD history of atrial fibrillation, GERD Last here May 20, 2010 CPAP 10 all night every night- helps sleep. Asks refill Provigil to help with long drives.  Allergy vaccine GO definitely helps- he is convinced.  He notes no asthma and no prednisone since last November- credits frequent handwashing, avoiding people with colds and the allergy vaccine. No new concerns. Continues cardiac rehab.   03/03/11-  37 year old man followed for severe chronic asthma, sleep apnea, allergic rhinitis, complicated by HBP CAD history of atrial fibrillation, GERD. Wife here. He recently had his fourth hospital stay since March, for gallbladder pain, cholecystectomy, cardiac stent. Cholecystectomy was 2 weeks ago. Through these he had shortness of breath and cough which has persisted and keep him awake. His nose runs.  07/03/11- 33 year old man followed for severe chronic asthma, sleep apnea, allergic rhinitis, complicated by HBP, CAD, history of atrial fibrillation, GERD. Wife here. Since last here has been to urgent care in emergency room 4 times for exacerbations of asthma. On prednisone 10 mg/day since February from Dr [name]. Definitely having reflux events and we discussed how that may be the significant issue. Denies sinus infection. Had chest x-rays in January and February which she was told were negative. He asks to try increasing his CPAP pressure because he doesn't feel he is sleeping quite well enough. We discussed this.  07/18/11- 11 year old man followed for severe chronic asthma, sleep apnea, allergic rhinitis, complicated by HBP, CAD, history of atrial fibrillation, GERD. Wife here. Seen by Dr Daub-Increased SOB and wheezing; was put on prednisone to help keep patient out of hospital; still not feeling  much better. Has been on prednisone 60 mg daily for past 4 days but still significant shortness of breath with exertion and wheeze. No excess 2 chest x-rays have been clear and EKG "knot heart". Using home nebulizer with DuoNeb 4 times daily plus his rescue inhaler at least twice per day. Nothing seems infected. He has not recognized heartburn although that has been an issue in the past. Denies chest pain, fever, ankle edema or palpitation. Some nasal congestion without much sneezing. Has been indoors without much exposure to pollen.  08/08/11- 44 year old man followed for severe chronic asthma, sleep apnea, allergic rhinitis, complicated by HBP, CAD, history of atrial fibrillation, GERD. Wife here. Hospitalized April 11-17 with acute exacerbation of his chronic fixed asthma. Anxiety and reflux are thought to be important. He says he is "doing great" now. He feels an anxiety medication would help and we discussed this. Currently on prednisone 20 mg daily.  10/04/11-  22 year old man followed for severe chronic asthma, sleep apnea, allergic rhinitis, complicated by HBP, CAD, history of atrial fibrillation, GERD. Wife here  Pt states increase of sob,wheezing,fatigue since getting out of hospital . No hospitalizations since last here. He stays short of breath especially with exertional dyspnea but is comfortable sitting and when lying in bed. His wife points out he is able to do yard work using a [employer]. We reviewed Dr.Hochrein's cardiology note. Question, , each of his dyspnea is pulmonary versus cardiac. We were asked to draw labs at this visit. He was criticized for using his nebulizer machine too frequently before his hospitalization but has only used it a total of 7 times since then. He had remained on maintenance prednisone  since January. His wife gradually tapered it off so he has had none in the last 3 weeks. He continues, and wants to continue, allergy vaccine at 1:10, well tolerated. CXR 07/22/11-    reviewed with them: Findings:  Grossly unchanged enlarged cardiac silhouette and mediastinal  contours post median sternotomy and CABG. Atherosclerotic  calcification within the thoracic aorta. There is persistent mild  elevation of the right hemidiaphragm. Grossly unchanged bibasilar  heterogeneous opacities favored to represent atelectasis. Grossly  unchanged bones including mild compression deformity of a lower  thoracic vertebral body.  IMPRESSION:  No acute cardiopulmonary disease.  Original Report Authenticated By: Waynard Reeds, M.D.  Office Spirometry: FEV1 1.40/58%, FVC 2.44/78%, FEV1/FEC oh 0.57/73%, FEF 25-75% 0.56/23%.   Review of Systems-see HPI Constitutional:   No-   weight loss, night sweats, fevers, chills, fatigue, lassitude. HEENT:   No-  headaches, difficulty swallowing, tooth/dental problems, sore throat,       No-  sneezing, itching, ear ache, nasal congestion, +post nasal drip,  CV:  No-   chest pain, orthopnea, PND, swelling in lower extremities, anasarca, dizziness, palpitations Resp: +  shortness of breath with exertion or at rest.              No-   productive cough,  +non-productive cough,  No-  coughing up of blood.              No-   change in color of mucus. +wheezing.   Skin: No-   rash or lesions. GI:  No- recognized  heartburn, indigestion, abdominal pain, nausea, vomiting, GU: . MS:  No-   joint pain or swelling.   Neuro- nothing unusual:  Psych:  No- change in mood or affect. No depression, + anxiety.  No memory loss.  Objective:   Physical Exam BP 124/78  Pulse 68  Ht 5\' 2"  (1.575 m)  Wt 162 lb 9.6 oz (73.755 kg)  BMI 29.74 kg/m2  SpO2 98%  General- Alert, Oriented, Affect-appropriate, Distress- none acute Skin- rash-none, lesions- none, excoriation- none   [place] Lymphadenopathy- none Head- atraumatic            Eyes- Gross vision intact, PERRLA, conjunctivae clear secretions            Ears- Hearing, canals- hearing aid             Nose- Clear, No- Septal dev, mucus, polyps, erosion, perforation             Throat- Mallampati II , mucosa clear , drainage- none, tonsils- atrophic Neck- flexible , trachea midline, no stridor , thyroid nl, carotid no bruit Chest - symmetrical excursion , unlabored           Heart/CV- RRR , no murmur , no gallop  , no rub, nl s1 s2                           - JVD- none , edema- none, stasis changes- none, varices- none           Lung- mild expiratory wheeze, unlabored, cough- none , dullness-none, rub- none.            Chest wall-  Abd-  Br/ Gen/ Rectal- Not done, not indicated Extrem- cyanosis- none, clubbing, none, atrophy- none, strength- nl Neuro- grossly intact to observation

## 2011-10-04 NOTE — Patient Instructions (Addendum)
Please follow up with Dr. Perry as needed 

## 2011-10-05 NOTE — Progress Notes (Signed)
Quick Note:  Spoke with Mrs Allmendinger-aware of results and will relay to patient. ______

## 2011-10-07 ENCOUNTER — Emergency Department (HOSPITAL_COMMUNITY)
Admission: EM | Admit: 2011-10-07 | Discharge: 2011-10-08 | Disposition: A | Discharge status: Home / Self Care | Payer: Medicare Other | Attending: Emergency Medicine | Admitting: Emergency Medicine

## 2011-10-07 ENCOUNTER — Emergency Department (HOSPITAL_COMMUNITY): Payer: Medicare Other

## 2011-10-07 DIAGNOSIS — Z79899 Other long term (current) drug therapy: Secondary | ICD-10-CM | POA: Insufficient documentation

## 2011-10-07 DIAGNOSIS — S51009A Unspecified open wound of unspecified elbow, initial encounter: Secondary | ICD-10-CM | POA: Insufficient documentation

## 2011-10-07 DIAGNOSIS — W19XXXA Unspecified fall, initial encounter: Secondary | ICD-10-CM

## 2011-10-07 DIAGNOSIS — W108XXA Fall (on) (from) other stairs and steps, initial encounter: Secondary | ICD-10-CM | POA: Insufficient documentation

## 2011-10-07 DIAGNOSIS — S0990XA Unspecified injury of head, initial encounter: Secondary | ICD-10-CM | POA: Insufficient documentation

## 2011-10-07 DIAGNOSIS — Z87891 Personal history of nicotine dependence: Secondary | ICD-10-CM | POA: Insufficient documentation

## 2011-10-07 DIAGNOSIS — Y999 Unspecified external cause status: Secondary | ICD-10-CM | POA: Insufficient documentation

## 2011-10-07 DIAGNOSIS — S0100XA Unspecified open wound of scalp, initial encounter: Secondary | ICD-10-CM | POA: Insufficient documentation

## 2011-10-07 DIAGNOSIS — S0101XA Laceration without foreign body of scalp, initial encounter: Secondary | ICD-10-CM

## 2011-10-07 DIAGNOSIS — IMO0002 Reserved for concepts with insufficient information to code with codable children: Secondary | ICD-10-CM

## 2011-10-07 MED ORDER — TETANUS-DIPHTH-ACELL PERTUSSIS 5-2.5-18.5 LF-MCG/0.5 IM SUSP
0.5000 mL | Freq: Once | INTRAMUSCULAR | Status: AC
  Administered 2011-10-07: 0.5 mL via INTRAMUSCULAR
  Filled 2011-10-07: qty 0.5

## 2011-10-07 NOTE — ED Notes (Signed)
Pt reports falling while going down concrete steps striking head against wall pt denies LOC Hx of taking plavix

## 2011-10-08 ENCOUNTER — Encounter: Payer: Self-pay | Admitting: Internal Medicine

## 2011-10-08 NOTE — ED Provider Notes (Signed)
Medical screening examination/treatment/procedure(s) were conducted as a shared visit with non-physician practitioner(s) and myself.  I personally evaluated the patient during the encounter  I saw this patient primarily, and was present for the procedure being performed  Ethelda Chick, MD 10/08/11 [phone]

## 2011-10-08 NOTE — Discharge Instructions (Signed)
Return to the ED with any concerns including vomiting, seizure activity, changes in vision or speech, weakness of arms or leg, or any other alarming symptoms  You should have staples removed in 3-5 days

## 2011-10-08 NOTE — ED Notes (Signed)
Staples to laceration on back of head by EDP and wound care by EMT

## 2011-10-08 NOTE — Assessment & Plan Note (Signed)
Well controlled. His vaccine is well tolerated and he wants to continue.

## 2011-10-08 NOTE — ED Provider Notes (Signed)
I was asked to repair pt's laceration to his head by Dr. Karma Ganja. Pt after a fall, hit head on concrete wall. Laceration to the back of the scalp. Hemostatic.   LACERATION REPAIR Performed by: Lottie Mussel Authorized by: Jaynie Crumble A Consent: Verbal consent obtained. Risks and benefits: risks, benefits and alternatives were discussed Consent given by: patient Patient identity confirmed: provided demographic data Prepped and Draped in normal sterile fashion Wound explored  Laceration Location:  Posterior scalp  Laceration Length: 1 cm and surrounding skin tears and abrasiosn   No Foreign Bodies seen or palpated  Anesthesia: no anesthesia used   Irrigation method: syringe Amount of cleaning: standard  Skin closure: stapes  Number of sutures:1    Patient tolerance: Patient tolerated the procedure well with no immediate complications.  The surrounding abrasions do not require repaired. I examined and explored wound, no foreign bodies identified.  Lottie Mussel, PA 10/08/11 [phone]

## 2011-10-08 NOTE — ED Provider Notes (Signed)
History     CSN: [id number]  Arrival date & time 10/07/11  2112   First MD Initiated Contact with Patient 10/07/11 2140      Chief Complaint  Patient presents with  . Fall  . Head Laceration    (Consider location/radiation/quality/duration/timing/severity/associated sxs/prior treatment) HPI Pt presents with c/o fall.  Pt states he was walking down some stairs and missed a stair at the bottom.  States it was a trip and fall, he denies fainting, no chest pain, no sob, no palpitations or dizziness prior to fall.  Did strike head, did not lose consciousness.  Laceration to posterior scalp.  Some pain in left elbow with skin tear.  Pt had stopped plavix several days ago due to finishing his course of plavix for heart disease.  There are no other alleviating or modifying factors, there are no other associated systemic symptoms  Past Medical History  Diagnosis Date  . CORONARY HEART DISEASE     a. s/p CABG;  b.  cath 06/27/10: S-Dx occluded, S-PDA 80-90% (tx with PCI); S-OM ok, L-LAD ok;  EF 65-70%  c.  s/p Promus DES to S-PDA 06/2010;   d. Myoview 8/12: low risk  . FIBRILLATION, ATRIAL     post op; ?documented during hosp. 06/2010  . SLEEP APNEA 09/2001    NPSG AHI 22/HR  . ALLERGIC RHINITIS   . ASTHMA   . Depression   . GERD     with HH, hx esophageal stricture  . DYSLIPIDEMIA   . HYPERTENSION     Echo 3/12: EF 55-60%; mod LVH; mild AS/AI; LAE; PASP 38; mild pulmo HTN  . Personal history of alcoholism   . Diverticulosis   . Benign liver cyst   . Skin cancer     L forearm  . Cataract     surgery to both eyes  . Esophageal stricture   . Hiatal hernia     Past Surgical History  Procedure Date  . Coronary artery bypass graft 02/2007  . Hernia repair 12/03/07  . Tonsillectomy   . Coronary angioplasty with stent placement 07/2010  . Cataract extraction, bilateral 2007  . Hernia repair unsure ?60's  . Cholecystectomy 02/21/2011    Procedure: LAPAROSCOPIC CHOLECYSTECTOMY WITH  INTRAOPERATIVE CHOLANGIOGRAM;  Surgeon: Valarie Merino, MD;  Location: [facility];  Service: General;  Laterality: N/A;    Family History  Problem Relation Age of Onset  . COPD Sister   . Asthma Sister   . Emphysema Sister   . Hypertension Sister   . Hypertension Mother   . Colon cancer Neg Hx   . Heart disease Brother   . Heart disease Mother   . Heart disease Father     History  Substance Use Topics  . Smoking status: Former Smoker -- 3.0 packs/day for 40 years    Types: Cigarettes    Quit date: 04/11/1979  . Smokeless tobacco: Never Used  . Alcohol Use: No      Review of Systems ROS reviewed and all otherwise negative except for mentioned in HPI  Allergies  Codeine  Home Medications   Current Outpatient Rx  Name Route Sig Dispense Refill  . RISAQUAD PO CAPS Oral Take 1 capsule by mouth daily. [facility] probiotics    . ALBUTEROL SULFATE HFA 108 (90 BASE) MCG/ACT IN AERS Inhalation Inhale 2 puffs into the lungs as needed. For asthma    . AMLODIPINE BESYLATE 5 MG PO TABS Oral Take 5 mg by mouth daily.    Marland Kitchen  ASPIRIN 81 MG PO TABS Oral Take 81 mg by mouth daily.     Marland Kitchen BISOPROLOL FUMARATE 10 MG PO TABS Oral Take 10 mg by mouth daily.    . BUDESONIDE 0.25 MG/2ML IN SUSP Nebulization Take 0.25 mg by nebulization 2 (two) times daily as needed. For asthma    . CETIRIZINE HCL 10 MG PO TABS Oral Take 10 mg by mouth daily.      . CHOLESTYRAMINE 4 G PO PACK Oral Take 1 packet by mouth 2 (two) times daily as needed. For loose stools    . CLONIDINE HCL 0.3 MG PO TABS Oral Take 0.3-0.6 mg by mouth 2 (two) times daily. Take 1 tablet in morning and 2 tablets at night    . FAMOTIDINE 20 MG PO TABS  take 1 tablet by mouth at bedtime 30 tablet 5  . IPRATROPIUM-ALBUTEROL 0.5-2.5 (3) MG/3ML IN SOLN Nebulization Take 3 mLs by nebulization every 6 (six) hours as needed. For asthma    . LATANOPROST 0.005 % OP SOLN Both Eyes Place 1 drop into both eyes at bedtime.     Marland Kitchen LOPERAMIDE HCL 2 MG PO  TABS Oral Take 2 mg by mouth as needed. For loose stool    . LORAZEPAM 0.5 MG PO TABS Oral Take 0.5 mg by mouth 2 (two) times daily as needed. For anxiety    . MONTELUKAST SODIUM 10 MG PO TABS Oral Take 10 mg by mouth at bedtime.    Marland Kitchen NITROGLYCERIN 0.4 MG SL SUBL Sublingual Place 0.4 mg under the tongue every 5 (five) minutes as needed. For chest pain    . PANTOPRAZOLE SODIUM 40 MG PO TBEC Oral Take 40 mg by mouth daily.    Marland Kitchen POTASSIUM CHLORIDE CRYS ER 20 MEQ PO TBCR Oral Take 20 mEq by mouth daily.    Marland Kitchen PRAVASTATIN SODIUM 80 MG PO TABS Oral Take 80 mg by mouth daily.      BP 177/72  Pulse 91  Temp 98.2 F (36.8 C) (Oral)  Resp 19  SpO2 95% Vitals reviewed Physical Exam Physical Examination: General appearance - alert, well appearing, and in no distress Mental status - alert, oriented to person, place, and time Eyes - pupils equal and reactive, extraocular eye movements intact Mouth - mucous membranes moist, pharynx normal without lesions Neck - supple, no significant adenopathy, no midline cspine tendnerness Chest - clear to auscultation, no wheezes, rales or rhonchi, symmetric air entry Heart - normal rate, regular rhythm, normal S1, S2, no murmurs, rubs, clicks or gallops Abdomen - soft, nontender, nondistended, no masses or organomegaly Back- no midline tenderness Neurological - alert, oriented, normal speech, no focal findings or movement disorder noted Musculoskeletal - no joint tenderness, deformity or swelling Extremities - peripheral pulses normal, no pedal edema, no clubbing or cyanosis Skin - normal coloration and turgor, no rashes, scalp laceration in posterior scalp, hematoma superior to laceration  ED Course  Procedures (including critical care time)  Labs Reviewed - No data to display Dg Elbow Complete Left  10/07/2011  *RADIOLOGY REPORT*  Clinical Data: Left elbow pain and laceration post fall  LEFT ELBOW - COMPLETE 3+ VIEW  Comparison: None  Findings: Mild diffuse  osseous demineralization. Joint spaces preserved. No acute fracture, dislocation or bone destruction. Dorsal soft tissue swelling. No elbow joint effusion.  IMPRESSION: No acute osseous abnormalities.  Original Report Authenticated By: Lollie Marrow, M.D.   Ct Head Wo Contrast  10/07/2011  *RADIOLOGY REPORT*  Clinical Data:  Head  laceration post fall, on Plavix  CT HEAD WITHOUT CONTRAST  Technique:  Contiguous axial images were obtained from the base of the skull through the vertex without contrast.  Comparison: 07/20/2011 limited CT sinuses  Findings: Basilar streak artifacts, for which repeat imaging was performed. Mild atrophy. Normal ventricular morphology. No midline shift or mass effect. Otherwise normal appearance of brain parenchyma. No intracranial hemorrhage, mass lesion, or evidence of acute infarction. No extra-axial fluid collections. Small posterior left parietal scalp hematoma. Atherosclerotic calcification of internal carotid and vertebral arteries at skull base. Visualized paranasal sinuses and mastoid air cells clear. Bones appear demineralized.  IMPRESSION: No acute intracranial abnormalities.  Original Report Authenticated By: Lollie Marrow, M.D.     1. Scalp laceration   2. Fall   3. Skin tear   4. Minor head injury       MDM  Pt presents with c/o fall and hitting his head.  Laceration repaired by PA with one staple.  Head CT without acute findings.  Skin tear on left elbow bandaged, tetanus updated.  Pt discharged with strict return precautions, he is agreeable with this plan.         Ethelda Chick, MD 10/08/11 [phone]

## 2011-10-08 NOTE — Assessment & Plan Note (Signed)
Continued good compliance and control with CPAP

## 2011-10-08 NOTE — Assessment & Plan Note (Signed)
At least moderately severe obstructive airways disease. There has been a significant reversible component but the fixed component is becoming more evident clinically. He is now off of prednisone. Medications reviewed. We had a discussion about whether scheduled use of his nebulizer machine was appropriate. We agreed that he would try it on a regular schedule for 3 or 4 days to see if he could tell a difference from when necessary use. If his cardiac rhythm is affected by his bronchodilators, it will increase the importance of minimizing nebulizer treatments.

## 2011-10-09 ENCOUNTER — Other Ambulatory Visit: Payer: Self-pay | Admitting: Cardiology

## 2011-10-11 ENCOUNTER — Telehealth: Payer: Self-pay | Admitting: Cardiology

## 2011-10-11 NOTE — Telephone Encounter (Signed)
Spoke with Harriett Sine - she will remove staple.

## 2011-10-11 NOTE — Telephone Encounter (Signed)
Please return call to patient wife at [phone].  Patient fell last week and recvd 1 staple in his head.  [name] would like to know if that staple could be removed  At the 7/5 carotid examination appnt.  Please return call to advise.

## 2011-10-13 ENCOUNTER — Encounter (INDEPENDENT_AMBULATORY_CARE_PROVIDER_SITE_OTHER): Payer: Medicare Other

## 2011-10-13 ENCOUNTER — Ambulatory Visit (INDEPENDENT_AMBULATORY_CARE_PROVIDER_SITE_OTHER): Payer: Medicare Other | Admitting: *Deleted

## 2011-10-13 DIAGNOSIS — IMO0002 Reserved for concepts with insufficient information to code with codable children: Secondary | ICD-10-CM

## 2011-10-13 DIAGNOSIS — T148XXA Other injury of unspecified body region, initial encounter: Secondary | ICD-10-CM

## 2011-10-13 DIAGNOSIS — R0989 Other specified symptoms and signs involving the circulatory and respiratory systems: Secondary | ICD-10-CM

## 2011-10-13 DIAGNOSIS — I6529 Occlusion and stenosis of unspecified carotid artery: Secondary | ICD-10-CM

## 2011-10-13 NOTE — Progress Notes (Signed)
Pt here for removal of suture from head--staple removed and slight pressure to wound until bleeding stopped

## 2011-10-18 ENCOUNTER — Encounter: Payer: Self-pay | Admitting: Internal Medicine

## 2011-10-18 ENCOUNTER — Ambulatory Visit (INDEPENDENT_AMBULATORY_CARE_PROVIDER_SITE_OTHER): Payer: Medicare Other | Admitting: Internal Medicine

## 2011-10-18 VITALS — BP 124/68 | HR 78 | Temp 98.2°F | Ht 63.5 in | Wt 159.1 lb

## 2011-10-18 DIAGNOSIS — E785 Hyperlipidemia, unspecified: Secondary | ICD-10-CM

## 2011-10-18 DIAGNOSIS — I10 Essential (primary) hypertension: Secondary | ICD-10-CM

## 2011-10-18 DIAGNOSIS — R609 Edema, unspecified: Secondary | ICD-10-CM

## 2011-10-18 MED ORDER — LOSARTAN POTASSIUM 50 MG PO TABS: 50.0000 mg | ORAL_TABLET | Freq: Every day | ORAL | Status: DC

## 2011-10-18 MED ORDER — FUROSEMIDE 20 MG PO TABS: 20.0000 mg | ORAL_TABLET | Freq: Every day | ORAL | Status: DC | PRN

## 2011-10-18 NOTE — Progress Notes (Signed)
Subjective:    Patient ID: [name]., male    DOB: 1930/10/20, 78 y.o.   MRN: [id number]  HPI  here for follow up-    Reviewed ER visit after accidental fall 10/07/11 > scalp laceration and traumatic L olecranon bursitis, sore but improving  Also reviewed chronic medical issues today:  Severe COPD, chronic asthma, remote tobacco - follows with pulm for same - frequent flares related to weather change.  reports compliance with ongoing nebulizer treatment (steroid and Alb) but unable to afford Spiriva or Daliresp. Changed to [facility] spring 2013 -does not feel improved but denies adverse side effects related to current therapy.   CAD s/p CABG 2008, DES to saph v graft 06/2010 due to abn stress test - follows with cards for same - reports compliance with ongoing medical treatment and no changes in medication dose or frequency. denies adverse side effects related to current therapy. no chest pain or anginal symptoms   HTN - reports compliance with ongoing medical treatment and no changes in medication dose or frequency. denies adverse side effects related to current therapy. no headache or weakness - increasing dependant edema in past 3-4 weeks  dyslipidemia - changed from simva 80mg  to prava 6/2011due to FDA warnings - reports compliance with ongoing medical treatment and no other changes in medication dose or frequency. denies adverse side effects related to current therapy. no muscle pain or weakness  Also episode of biliary pancreatitis November 2012, followed by laparoscopic cholecystectomy 02/21/11 -   Past Medical History  Diagnosis Date  . CORONARY HEART DISEASE     a. s/p CABG;  b.  cath 06/27/10: S-Dx occluded, S-PDA 80-90% (tx with PCI); S-OM ok, L-LAD ok;  EF 65-70%  c.  s/p Promus DES to S-PDA 06/2010;   d. Myoview 8/12: low risk  . FIBRILLATION, ATRIAL     post op; ?documented during hosp. 06/2010  . SLEEP APNEA 09/2001    NPSG AHI 22/HR  . ALLERGIC RHINITIS   . ASTHMA   .  Depression   . GERD     with HH, hx esophageal stricture  . DYSLIPIDEMIA   . HYPERTENSION     Echo 3/12: EF 55-60%; mod LVH; mild AS/AI; LAE; PASP 38; mild pulmo HTN  . Personal history of alcoholism   . Diverticulosis   . Benign liver cyst   . Skin cancer     L forearm  . Cataract     surgery to both eyes  . Esophageal stricture   . Hiatal hernia     Review of Systems  Constitutional: Negative for fever, fatigue and unexpected weight change.  Respiratory: Negative for cough and shortness of breath.   Cardiovascular: Positive for leg swelling (BLE edema, dep and worse thru course of day). Negative for chest pain.  Neurological: Negative for dizziness, weakness, light-headedness, numbness and headaches.      Objective:   Physical Exam  BP 124/68  Pulse 78  Temp 98.2 F (36.8 C) (Oral)  Ht 5' 3.5" (1.613 m)  Wt 159 lb 1.9 oz (72.176 kg)  BMI 27.74 kg/m2  SpO2 97% Wt Readings from Last 3 Encounters:  10/18/11 159 lb 1.9 oz (72.176 kg)  10/04/11 162 lb 9.6 oz (73.755 kg)  10/04/11 161 lb 9.6 oz (73.301 kg)   Constitutional:  He appears well-developed and well-nourished. No acute distress.  Cardiovascular: Normal rate, irregular rhythm and normal heart sounds.  No murmur heard. trace dep BLE edema Pulmonary/Chest: no increased effort  at rest, decreased base breath sounds, no expiratory wheezes.  Neurological: he is alert and oriented to person, place, and time. No cranial nerve deficit. Coordination normal.   Skin - posterior scalp with scabbed laceration, no infection or drainage  Psychiatric: he has a normal mood and affect. behavior is normal. Judgment and thought content normal.     Lab Results  Component Value Date   WBC 10.0 08/30/2011   HGB 12.3* 08/30/2011   HCT 37.5* 08/30/2011   PLT 209.0 08/30/2011   CHOL 119 10/04/2011   TRIG 114.0 10/04/2011   HDL 34.50* 10/04/2011   ALT 17 10/04/2011   AST 21 10/04/2011   NA 140 08/30/2011   K 3.9 08/30/2011   CL 102  08/30/2011   CREATININE 0.7 08/30/2011   BUN 15 08/30/2011   CO2 31 08/30/2011   TSH 1.212 02/21/2011   PSA 0.62 10/10/2010   INR 1.00 12/16/2010   HGBA1C  Value: 6.0 (NOTE)   The ADA recommends the following therapeutic goals for glycemic   control related to Hgb A1C measurement:   Goal of Therapy:   < 7.0% Hgb A1C   Action Suggested:  > 8.0% Hgb A1C   Ref:  Diabetes Care, 22, Suppl. 1, 1999 02/26/2007   Assessment & Plan:  See problem list. Medications and labs reviewed today.  Edema - ?amlodipine side effects - stop same - lasix prn - erx done See HTN too re: starting ARB

## 2011-10-18 NOTE — Patient Instructions (Signed)
It was good to see you today. Stop amlodipine now Start losartan for blood pressure in place of amlodipine Use furosemide each AM only AS NEEDED (if needed) for swelling in feet/legs Your prescription(s) have been submitted to your pharmacy. Please take as directed and contact our office if you believe you are having problem(s) with the medication(s). Other Medications reviewed, no additional changes at this time. Please schedule followup in 3-4 months for blood pressure check and review, call sooner if problems.

## 2011-10-18 NOTE — Assessment & Plan Note (Signed)
Dep edema, mild - no other signs vol overload Stop amlodpine, start ARB - losartan 50 qd - erx done Also low dose lasix to use prn  BP Readings from Last 3 Encounters:  10/18/11 124/68  10/08/11 154/81  10/04/11 124/78

## 2011-10-18 NOTE — Assessment & Plan Note (Signed)
On high dose prava - tolerating well, LDL<70 at goal The current medical regimen is effective;  continue present plan and medications.  

## 2011-10-23 ENCOUNTER — Telehealth: Payer: Self-pay

## 2011-10-23 NOTE — Telephone Encounter (Signed)
Double up dose of lasix x next 3 days (take 40mg  each AM) then resume 20mg  daily - continue to elevate

## 2011-10-23 NOTE — Telephone Encounter (Signed)
Pt's spouse advised and understood. Will call back as needed.

## 2011-10-23 NOTE — Telephone Encounter (Signed)
Pt's spouse called stating that pt's legs are still swollen even with Lasix QD and elevation. Spouse is concerned that medication is not working, please advise.

## 2011-10-30 ENCOUNTER — Other Ambulatory Visit: Payer: Self-pay | Admitting: Cardiology

## 2011-10-31 NOTE — Telephone Encounter (Signed)
..   Requested Prescriptions   Pending Prescriptions Disp Refills  . pravastatin (PRAVACHOL) 80 MG tablet [Pharmacy Med Name: PRAVASTATIN SODIUM 80 MG TAB] 30 tablet 6    Sig: take 1 tablet by mouth every evening  . bisoprolol (ZEBETA) 10 MG tablet [Pharmacy Med Name: BISOPROLOL FUMARATE 10 MG TAB] 30 tablet 6    Sig: Take 1 tablet (10 mg total) by mouth daily.

## 2011-11-03 ENCOUNTER — Telehealth: Payer: Self-pay | Admitting: Internal Medicine

## 2011-11-03 NOTE — Telephone Encounter (Signed)
Spoke with patient wife of instructions of BP med and OV

## 2011-11-03 NOTE — Telephone Encounter (Signed)
Patient spouse called back concerning BP readings that were called in this AM were the wrong ones. Spouse called in BP readings at 12/52pm. Spouse unsure at times of the day the readings are taken. Patient seems to think the BP med is not lasting long enough. Readings called in  Are in ranges of 130/65  To 192/101. Spouse read off  14 readings of BP. Have listing of them if you need. See sue.

## 2011-11-03 NOTE — Telephone Encounter (Signed)
Increase losartan to 50mg  BID (currently 50mg  qd) -  Continue other meds as rx'd  OV next week to review BP log and meds, call sooner if problems

## 2011-11-03 NOTE — Telephone Encounter (Signed)
Caller: Ruth/Spouse; PCP: Rene Paci; CB#: [phone]; ; ; Call regarding Fluctuation in BP;   Spouse states patient was seen in office 10/18/11. States BP medication was changed from Norvasc to Cozaar due to increased swelling in lower extremities. Spouse states Lasix 20mg . was increased to 2 tablets X 3 days, starting on 10/23/11. States extremity swelling resolved. States patient is currently taking Lasix 20mg . daily. Spouse states patient's BP has not been well controlled since changed to Cozaar. States BP readings fluctuate. BP has been ranging from 156/88-192/101. States evening readings are elevated. BP 183/89 @ 0730 11/03/11. BP 160/89 @ 1224 11/03/11. Patient denies headache or visual disturbances. Denies chest pain, numbness or tingling. Triage per Hypertension Protocol. No emergent sx identified. Care advice given per guidelines. Spouse advised for patient to avoid salt. Call back parameters reviewed. Spouse verbalizes understanding. Spouse inquiring about medication change/adjustment.  PLEASE RETURN CALL TO PATIENT @ [phone] REGARDING POOR BP CONTROL SINCE MEDICATION CHANGE FROM NORVASC TO COZAAR ON 10/18/11. PLEASE ADVISE PATIENT IF APPT. IS RECOMMENDED. PATIENT USES [facility] ON [address] @ [phone]. Office message sent to Triage Pool , via Epic EHR,  related to BP range exceeding treatment plan.

## 2011-11-08 ENCOUNTER — Ambulatory Visit (INDEPENDENT_AMBULATORY_CARE_PROVIDER_SITE_OTHER): Payer: Medicare Other | Admitting: Internal Medicine

## 2011-11-08 ENCOUNTER — Encounter: Payer: Self-pay | Admitting: Internal Medicine

## 2011-11-08 VITALS — BP 162/70 | HR 69 | Temp 98.1°F | Ht 63.5 in | Wt 160.0 lb

## 2011-11-08 DIAGNOSIS — J45901 Unspecified asthma with (acute) exacerbation: Secondary | ICD-10-CM

## 2011-11-08 DIAGNOSIS — I10 Essential (primary) hypertension: Secondary | ICD-10-CM

## 2011-11-08 DIAGNOSIS — J441 Chronic obstructive pulmonary disease with (acute) exacerbation: Secondary | ICD-10-CM

## 2011-11-08 MED ORDER — AMLODIPINE BESYLATE 2.5 MG PO TABS: 2.5000 mg | ORAL_TABLET | Freq: Every day | ORAL | Status: DC

## 2011-11-08 MED ORDER — PREDNISONE (PAK) 10 MG PO TABS: 10.0000 mg | ORAL_TABLET | ORAL | Status: DC

## 2011-11-08 MED ORDER — METHYLPREDNISOLONE ACETATE 80 MG/ML IJ SUSP
120.0000 mg | Freq: Once | INTRAMUSCULAR | Status: AC
  Administered 2011-11-08: 120 mg via INTRAMUSCULAR

## 2011-11-08 NOTE — Progress Notes (Signed)
Subjective:    Patient ID: [name]., male    DOB: [birth year], 78 y.o.   MRN: [id number]  HPI  here for follow up - poor blood pressure control since med changes early 10/2011-    Also flare of asthma with increase wheeze last 48h - no fever  Also reviewed chronic medical issues today:  Severe COPD, chronic asthma, remote tobacco - follows with pulm for same - frequent flares related to weather change (see above).  reports compliance with ongoing nebulizer treatment (steroid and Alb) but unable to afford Spiriva or Daliresp. Changed to [facility] spring 2013 -does not feel improved but denies adverse side effects related to current therapy.   CAD s/p CABG 2008, DES to saph v graft 06/2010 due to abn stress test - follows with cards for same - reports compliance with ongoing medical treatment and no changes in medication dose or frequency. denies adverse side effects related to current therapy. no chest pain or anginal symptoms   HTN - see above - reports compliance with ongoing medical treatment and no changes in medication dose or frequency. denies adverse side effects related to current therapy. no headache or weakness -  dyslipidemia - changed from simva 80mg  to prava 6/2011due to FDA warnings - reports compliance with ongoing medical treatment and no other changes in medication dose or frequency. denies adverse side effects related to current therapy. no muscle pain or weakness  Also episode of biliary pancreatitis November 2012, followed by laparoscopic cholecystectomy 02/21/11 -   Past Medical History  Diagnosis Date  . CORONARY HEART DISEASE     a. s/p CABG;  b.  cath 06/27/10: S-Dx occluded, S-PDA 80-90% (tx with PCI); S-OM ok, L-LAD ok;  EF 65-70%  c.  s/p Promus DES to S-PDA 06/2010;   d. Myoview 8/12: low risk  . FIBRILLATION, ATRIAL     post op; ?documented during hosp. 06/2010  . SLEEP APNEA 09/2001    NPSG AHI 22/HR  . ALLERGIC RHINITIS   . ASTHMA   . Depression   .  GERD     with HH, hx esophageal stricture  . DYSLIPIDEMIA   . HYPERTENSION     Echo 3/12: EF 55-60%; mod LVH; mild AS/AI; LAE; PASP 38; mild pulmo HTN  . Personal history of alcoholism   . Diverticulosis   . Benign liver cyst   . Skin cancer     L forearm  . Cataract     surgery to both eyes  . Esophageal stricture   . Hiatal hernia     Review of Systems  Constitutional: Negative for fever, fatigue and unexpected weight change.  Respiratory: Negative for cough and shortness of breath.   Cardiovascular: Negative for chest pain and leg swelling.  Neurological: Negative for dizziness, weakness, light-headedness, numbness and headaches.      Objective:   Physical Exam  BP 162/70  Pulse 69  Temp 98.1 F (36.7 C) (Oral)  Ht 5' 3.5" (1.613 m)  Wt 160 lb (72.576 kg)  BMI 27.90 kg/m2  SpO2 97% Wt Readings from Last 3 Encounters:  11/08/11 160 lb (72.576 kg)  10/18/11 159 lb 1.9 oz (72.176 kg)  10/04/11 162 lb 9.6 oz (73.755 kg)   Constitutional:  He appears well-developed and well-nourished. No acute distress. Wife at side Cardiovascular: Normal rate, irregular rhythm and normal heart sounds.  No murmur heard. no BLE edema Pulmonary/Chest: no increased effort at rest, decreased base breath sounds, +expiratory wheezes.  Neurological: he  is alert and oriented to person, place, and time. No cranial nerve deficit. Coordination normal.   Psychiatric: he has a normal mood and affect. behavior is normal. Judgment and thought content normal.     Lab Results  Component Value Date   WBC 10.0 08/30/2011   HGB 12.3* 08/30/2011   HCT 37.5* 08/30/2011   PLT 209.0 08/30/2011   CHOL 119 10/04/2011   TRIG 114.0 10/04/2011   HDL 34.50* 10/04/2011   ALT 17 10/04/2011   AST 21 10/04/2011   NA 140 08/30/2011   K 3.9 08/30/2011   CL 102 08/30/2011   CREATININE 0.7 08/30/2011   BUN 15 08/30/2011   CO2 31 08/30/2011   TSH 1.212 02/21/2011   PSA 0.62 10/10/2010   INR 1.00 12/16/2010   HGBA1C  Value: 6.0  (NOTE)   The ADA recommends the following therapeutic goals for glycemic   control related to Hgb A1C measurement:   Goal of Therapy:   < 7.0% Hgb A1C   Action Suggested:  > 8.0% Hgb A1C   Ref:  Diabetes Care, 22, Suppl. 1, 1999 02/26/2007   Assessment & Plan:  See problem list. Medications and labs reviewed today.  Acute asthma exac - IM medrol 120mg  today and in office Alb neb pred pak and continue nebs - pulm follow up as neeed

## 2011-11-08 NOTE — Assessment & Plan Note (Signed)
Stopped amlodipine early 10/2011 due to mild dependant edema (now resolved) started ARB but subop control Resume lower dose amlodipine in addition to ongoing ARB and other cardiac meds Also continue low dose lasix to use prn  BP Readings from Last 3 Encounters:  11/08/11 162/70  10/18/11 124/68  10/08/11 154/81

## 2011-11-08 NOTE — Patient Instructions (Addendum)
It was good to see you today. Resume amlodipine for blood pressure but lower dose - 2.5mg  daily - Continue losartan 50mg  2x/day and other medications as reviewed Pred taper for asthma flare - Your prescription(s) have been submitted to your pharmacy. Please take as directed and contact our office if you believe you are having problem(s) with the medication(s). Medrol shot and nebulizer treatment in office today Continue to monitor blood pressure and call if not improving with these med changes Please schedule followup in 3-4 months (or as scheduled), call sooner if problems.

## 2011-11-14 ENCOUNTER — Telehealth: Payer: Self-pay | Admitting: Internal Medicine

## 2011-11-14 ENCOUNTER — Encounter (HOSPITAL_COMMUNITY): Payer: Self-pay

## 2011-11-14 ENCOUNTER — Emergency Department (HOSPITAL_COMMUNITY)
Admission: EM | Admit: 2011-11-14 | Discharge: 2011-11-14 | Disposition: A | Discharge status: Home / Self Care | Payer: Medicare Other | Source: Home / Self Care | Attending: Emergency Medicine | Admitting: Emergency Medicine

## 2011-11-14 DIAGNOSIS — I10 Essential (primary) hypertension: Secondary | ICD-10-CM

## 2011-11-14 LAB — POCT I-STAT, CHEM 8
Chloride: 101 mEq/L (ref 96–112)
Creatinine, Ser: 0.9 mg/dL (ref 0.50–1.35)
Glucose, Bld: 110 mg/dL — ABNORMAL HIGH (ref 70–99)
Potassium: 4.3 mEq/L (ref 3.5–5.1)
Sodium: 142 mEq/L (ref 135–145)

## 2011-11-14 MED ORDER — TORSEMIDE 20 MG PO TABS: 20.0000 mg | ORAL_TABLET | Freq: Every day | ORAL | Status: DC

## 2011-11-14 MED ORDER — FUROSEMIDE 40 MG PO TABS
ORAL_TABLET | ORAL | Status: AC
  Filled 2011-11-14: qty 1

## 2011-11-14 MED ORDER — FUROSEMIDE 40 MG PO TABS
40.0000 mg | ORAL_TABLET | Freq: Once | ORAL | Status: AC
  Administered 2011-11-14: 40 mg via ORAL

## 2011-11-14 NOTE — Telephone Encounter (Signed)
Caller: Ruth/Spouse; PCP: Rene Paci; CB#: [phone]; Call regarding High BP Ranging From 210/98  To 150/84 L arm/sitting.  Onset 11/14/11 0630.  Last BP 187/89 at 1530  11/14/11;  Pulse ranges from 75-80. Both sysltolic and diastolic were over 200 on 11/13/11.  Wife stopped Norvac 2.5 mg BID; is giving 5 mg qd since 11/11/11.  Also stopped Losartin 11/11/11.  No current edema. Since it is after 1600 and no appointments remain for 11/14/11, advised to see [facility] within 4 hrs for systolic blood pressure of more than 180 mgHg per Hypertension Diagnosed or Suspected Guideline.

## 2011-11-14 NOTE — ED Provider Notes (Addendum)
Chief Complaint  Patient presents with  . Hypertension    History of Present Illness:  [name] is a 78 year old male who has had a long-standing history of high blood pressure. He is being followed at [facility] healthcare by Dr. Felicity Coyer and Dr. Antoine Poche. He is on a number of blood pressure pills including Norvasc, Cozaar, Zebeta, and Catapres. He also has Lasix but he has not been taking this. Recently his primary care doctor has been making some changes in his blood pressure medication. He was on Norvasc 2.5 mg twice a day and was switched to 5 mg a day. He also was on Cozaar, but for some reason his wife decided to stop this. He checked his blood pressure today and it was very high at 210/98. He usually runs around 115/75. He denies feeling bad any way including no headaches, dizziness, lightheadedness, or blurry vision. He denies shortness of breath, PND, orthopnea, chest pain, tightness, pressure, but he does have some ankle edema.  Review of Systems:  Other than noted above, the patient denies any of the following symptoms: Systemic:  No fever, chills, fatigue, weight loss or gain. Respiratory:  No coughing, wheezing, or shortness of breath. Cardiac:  No chest pain, tightness, pressure, palpitations, syncope, or edema. Neuro:  No headache, dizziness, blurred vision, weakness, paresthesias, or strokelike symptoms.  PMFSH:  Past medical history, family history, social history, meds, and allergies were reviewed.  Physical Exam:   Vital signs:  BP 225/95  Pulse 90  Temp 98.5 F (36.9 C) (Oral)  Resp 20  SpO2 100% General:  Alert, oriented, in no distress. Lungs:  Breath sounds clear and equal bilaterally.  No wheezes, rales, or rhonchi. Heart:  Regular rhythm, no gallops, murmers, clicks or rubs.  Abdomen:  Soft and flat.  Nontender, no organomegaly or mass.  No pulsatile midline abdominal mass or bruit. Ext:  No edema, pulses full.  Labs:   Results for orders placed during the hospital  encounter of 11/14/11  POCT I-STAT, CHEM 8      Component Value Range   Sodium 142  135 - 145 mEq/L   Potassium 4.3  3.5 - 5.1 mEq/L   Chloride 101  96 - 112 mEq/L   BUN 25 (*) 6 - 23 mg/dL   Creatinine, Ser 4.09  0.50 - 1.35 mg/dL   Glucose, Bld 811 (*) 70 - 99 mg/dL   Calcium, Ion 9.14  7.82 - 1.30 mmol/L   TCO2 30  0 - 100 mmol/L   Hemoglobin 16.3  13.0 - 17.0 g/dL   HCT 95.6  21.3 - 08.6 %     Medications given in [facility]:  He was given furosemide 40 mg by mouth and tolerated this well without any immediate side effects.  Assessment:  The encounter diagnosis was Hypertension.  Plan:   1.  The following meds were prescribed:   New Prescriptions   TORSEMIDE (DEMADEX) 20 MG TABLET    Take 1 tablet (20 mg total) by mouth daily.     He was also told to get back on his losartan and to continue all his other blood pressure pills.  2.  The patient was instructed in symptomatic care and handouts were given. 3.  The patient was told to return if becoming worse in any way, if no better in 3 or 4 days, and given some red flag symptoms that would indicate earlier return.  Follow up:  The patient was told to follow up with Dr. Felicity Coyer in  2 days.     Reuben Likes, MD 11/14/11 2137  Reuben Likes, MD 11/17/11 [phone]

## 2011-11-14 NOTE — ED Notes (Addendum)
Pt states PCP recently changed his norvasc to 2.5mg  BID from 5 mg daily and added cozaar 50 mg BID. He began to experience swelling in his feet and ankles so approx. 4 days ago he stopped taking the cozaar and went back to norvasc 5 mg in the am.  His BP has been increasing since he made these medication changes.  Denies chest pain or SOB.  Peripheral edema noted to lower legs above his socks.

## 2011-11-14 NOTE — Progress Notes (Signed)
Stitch in head removed--nt

## 2011-11-15 ENCOUNTER — Ambulatory Visit: Payer: Medicare Other | Admitting: Internal Medicine

## 2011-11-15 NOTE — Telephone Encounter (Signed)
Noted - thanks - pt was seen in UC for same

## 2011-11-17 ENCOUNTER — Encounter: Payer: Self-pay | Admitting: Internal Medicine

## 2011-11-17 ENCOUNTER — Ambulatory Visit (INDEPENDENT_AMBULATORY_CARE_PROVIDER_SITE_OTHER): Payer: Medicare Other | Admitting: Internal Medicine

## 2011-11-17 VITALS — BP 162/98 | HR 71 | Temp 98.4°F | Ht 63.5 in | Wt 155.4 lb

## 2011-11-17 DIAGNOSIS — I10 Essential (primary) hypertension: Secondary | ICD-10-CM

## 2011-11-17 DIAGNOSIS — I2510 Atherosclerotic heart disease of native coronary artery without angina pectoris: Secondary | ICD-10-CM

## 2011-11-17 MED ORDER — AMLODIPINE BESYLATE 5 MG PO TABS: 5.0000 mg | ORAL_TABLET | Freq: Two times a day (BID) | ORAL | Status: DC

## 2011-11-17 MED ORDER — TORSEMIDE 20 MG PO TABS: 20.0000 mg | ORAL_TABLET | Freq: Every day | ORAL | Status: DC

## 2011-11-17 NOTE — Assessment & Plan Note (Signed)
DES 06/28/10- CABG 2008 - no anginal symptoms  The current medical regimen is effective; ASA, plavix, statin: continue present plan and medications.  Follow up with cards as ongoing  

## 2011-11-17 NOTE — Patient Instructions (Addendum)
It was good to see you today. take amlodipine 5mg  2x/day and losartan 50mg  2x/day continue with daily Demadex in place of lasix - Your prescription(s) have been submitted to your pharmacy. Please take as directed and contact our office if you believe you are having problem(s) with the medication(s). Other medications reviewed - no other changes Continue to monitor blood pressure 2 or 3x/day and call if not improving with these med changes - call if top >180 or bottom>100 Please schedule followup in 4 days, call sooner if problems.

## 2011-11-17 NOTE — Assessment & Plan Note (Signed)
Stopped amlodipine early 10/2011 due to mild dependant edema (now resolved) started ARB 10/2011 but subop control  Resumed lower dose amlodipine 7/31 in addition to ongoing ARB and other cardiac meds Resume max dose amlodipine now and continue daily diuretics (changed lasix to demadex by UC 8/8)  BP Readings from Last 3 Encounters:  11/17/11 162/98  11/14/11 225/95  11/08/11 162/70

## 2011-11-17 NOTE — Progress Notes (Signed)
Subjective:    Patient ID: [name]., male    DOB: 09-14-1930, 78 y.o.   MRN: [id number]  Hypertension Pertinent negatives include no chest pain, headaches or shortness of breath.  poor blood pressure control since med changes early 10/2011-   ER visit for same 8/8 reviewed  Also reviewed chronic medical issues today:  Severe COPD, chronic asthma, remote tobacco - follows with pulm for same - frequent flares related to weather change.  reports compliance with ongoing nebulizer treatment (steroid and Alb) but unable to afford Spiriva or Daliresp. Changed to [facility] spring 2013 -does not feel improved but denies adverse side effects related to current therapy.   CAD s/p CABG 2008, DES to saph v graft 06/2010 due to abn stress test - follows with cards for same - reports compliance with ongoing medical treatment and no changes in medication dose or frequency. denies adverse side effects related to current therapy. no chest pain or anginal symptoms   HTN - see above - reports compliance with ongoing medical treatment and no changes in medication dose or frequency. denies adverse side effects related to current therapy. no headache or weakness -  dyslipidemia - changed from simva 80mg  to prava 6/2011due to FDA warnings - reports compliance with ongoing medical treatment and no other changes in medication dose or frequency. denies adverse side effects related to current therapy. no muscle pain or weakness  Also episode of biliary pancreatitis November 2012, followed by laparoscopic cholecystectomy 02/21/11 -   Past Medical History  Diagnosis Date  . CORONARY HEART DISEASE     a. s/p CABG;  b.  cath 06/27/10: S-Dx occluded, S-PDA 80-90% (tx with PCI); S-OM ok, L-LAD ok;  EF 65-70%  c.  s/p Promus DES to S-PDA 06/2010;   d. Myoview 8/12: low risk  . FIBRILLATION, ATRIAL     post op; ?documented during hosp. 06/2010  . SLEEP APNEA 09/2001    NPSG AHI 22/HR  . ALLERGIC RHINITIS   . ASTHMA   .  Depression   . GERD     with HH, hx esophageal stricture  . DYSLIPIDEMIA   . HYPERTENSION     Echo 3/12: EF 55-60%; mod LVH; mild AS/AI; LAE; PASP 38; mild pulmo HTN  . Personal history of alcoholism   . Diverticulosis   . Benign liver cyst   . Skin cancer     L forearm  . Cataract     surgery to both eyes  . Esophageal stricture   . Hiatal hernia     Review of Systems  Constitutional: Negative for fever, fatigue and unexpected weight change.  Respiratory: Negative for cough and shortness of breath.   Cardiovascular: Negative for chest pain and leg swelling.  Neurological: Negative for dizziness, weakness, light-headedness, numbness and headaches.      Objective:   Physical Exam  BP 162/98  Pulse 71  Temp 98.4 F (36.9 C) (Oral)  Ht 5' 3.5" (1.613 m)  Wt 155 lb 6.4 oz (70.489 kg)  BMI 27.10 kg/m2  SpO2 97% Wt Readings from Last 3 Encounters:  11/17/11 155 lb 6.4 oz (70.489 kg)  11/08/11 160 lb (72.576 kg)  10/18/11 159 lb 1.9 oz (72.176 kg)   Constitutional:  He appears well-developed and well-nourished. No acute distress. Wife at side Cardiovascular: Normal rate, irregular rhythm and normal heart sounds.  No murmur heard. no BLE edema Pulmonary/Chest: no increased effort at rest, decreased base breath sounds, no wheezes.  Neurological: he is  alert and oriented to person, place, and time. No cranial nerve deficit. Coordination normal.   Psychiatric: he has a normal mood and affect. behavior is normal. Judgment and thought content normal.     Lab Results  Component Value Date   WBC 10.0 08/30/2011   HGB 16.3 11/14/2011   HCT 48.0 11/14/2011   PLT 209.0 08/30/2011   CHOL 119 10/04/2011   TRIG 114.0 10/04/2011   HDL 34.50* 10/04/2011   ALT 17 10/04/2011   AST 21 10/04/2011   NA 142 11/14/2011   K 4.3 11/14/2011   CL 101 11/14/2011   CREATININE 0.90 11/14/2011   BUN 25* 11/14/2011   CO2 31 08/30/2011   TSH 1.212 02/21/2011   PSA 0.62 10/10/2010   INR 1.00 12/16/2010   HGBA1C   Value: 6.0 (NOTE)   The ADA recommends the following therapeutic goals for glycemic   control related to Hgb A1C measurement:   Goal of Therapy:   < 7.0% Hgb A1C   Action Suggested:  > 8.0% Hgb A1C   Ref:  Diabetes Care, 22, Suppl. 1, 1999 02/26/2007   Assessment & Plan:  See problem list. Medications and labs reviewed today.

## 2011-11-21 ENCOUNTER — Ambulatory Visit (INDEPENDENT_AMBULATORY_CARE_PROVIDER_SITE_OTHER): Payer: Medicare Other | Admitting: Internal Medicine

## 2011-11-21 ENCOUNTER — Encounter: Payer: Self-pay | Admitting: Internal Medicine

## 2011-11-21 VITALS — BP 130/62 | HR 83 | Temp 98.2°F | Ht 63.5 in | Wt 157.1 lb

## 2011-11-21 DIAGNOSIS — I10 Essential (primary) hypertension: Secondary | ICD-10-CM

## 2011-11-21 NOTE — Progress Notes (Signed)
Subjective:    Patient ID: [name]., male    DOB: [birth year], 78 y.o.   MRN: [id number]  Hypertension This is a chronic problem. The problem has been waxing and waning since onset. The problem is uncontrolled. Associated symptoms include anxiety. Pertinent negatives include no chest pain, headaches or shortness of breath. There are no associated agents to hypertension. Past treatments include calcium channel blockers, angiotensin blockers, beta blockers, central alpha agonists and diuretics. The current treatment provides significant improvement. There are no compliance problems.  Hypertensive end-organ damage includes CAD/MI. There is no history of CVA, heart failure or PVD.  poor blood pressure control since med changes early 10/2011-   ER visit for same 8/8 reviewed  Also reviewed chronic medical issues today:  Severe COPD, chronic asthma, remote tobacco - follows with pulm for same - frequent flares related to weather change.  reports compliance with ongoing nebulizer treatment (steroid and Alb) but unable to afford Spiriva or Daliresp. Changed to [facility] spring 2013 -does not feel improved but denies adverse side effects related to current therapy.   CAD s/p CABG 2008, DES to saph v graft 06/2010 due to abn stress test - follows with cards for same - reports compliance with ongoing medical treatment and no changes in medication dose or frequency. denies adverse side effects related to current therapy. no chest pain or anginal symptoms   HTN - see above - reports compliance with ongoing medical treatment and no changes in medication dose or frequency. denies adverse side effects related to current therapy. no headache or weakness -  dyslipidemia - changed from simva 80mg  to prava 6/2011due to FDA warnings - reports compliance with ongoing medical treatment and no other changes in medication dose or frequency. denies adverse side effects related to current therapy. no muscle pain or  weakness  Also episode of biliary pancreatitis November 2012, followed by laparoscopic cholecystectomy 02/21/11 -   Past Medical History  Diagnosis Date  . CORONARY HEART DISEASE     a. s/p CABG;  b.  cath 06/27/10: S-Dx occluded, S-PDA 80-90% (tx with PCI); S-OM ok, L-LAD ok;  EF 65-70%  c.  s/p Promus DES to S-PDA 06/2010;   d. Myoview 8/12: low risk  . FIBRILLATION, ATRIAL     post op; ?documented during hosp. 06/2010  . SLEEP APNEA 09/2001    NPSG AHI 22/HR  . ALLERGIC RHINITIS   . ASTHMA   . Depression   . GERD     with HH, hx esophageal stricture  . DYSLIPIDEMIA   . HYPERTENSION     Echo 3/12: EF 55-60%; mod LVH; mild AS/AI; LAE; PASP 38; mild pulmo HTN  . Personal history of alcoholism   . Diverticulosis   . Benign liver cyst   . Skin cancer     L forearm  . Cataract     surgery to both eyes  . Esophageal stricture   . Hiatal hernia     Review of Systems  Constitutional: Negative for fever, fatigue and unexpected weight change.  Respiratory: Negative for cough and shortness of breath.   Cardiovascular: Negative for chest pain and leg swelling.  Neurological: Negative for dizziness, weakness, light-headedness, numbness and headaches.      Objective:   Physical Exam  BP 130/62  Pulse 83  Temp 98.2 F (36.8 C) (Oral)  Ht 5' 3.5" (1.613 m)  Wt 157 lb 1.9 oz (71.269 kg)  BMI 27.40 kg/m2  SpO2 97% Wt Readings from Last  3 Encounters:  11/21/11 157 lb 1.9 oz (71.269 kg)  11/17/11 155 lb 6.4 oz (70.489 kg)  11/08/11 160 lb (72.576 kg)   Constitutional:  He appears well-developed and well-nourished. No acute distress. Wife at side Cardiovascular: Normal rate, irregular rhythm and normal heart sounds.  No murmur heard. no BLE edema Pulmonary/Chest: no increased effort at rest, decreased base breath sounds, no wheezes.  Neurological: he is alert and oriented to person, place, and time. No cranial nerve deficit. Coordination normal.   Psychiatric: he has a normal mood  and affect. behavior is normal. Judgment and thought content normal.     Lab Results  Component Value Date   WBC 10.0 08/30/2011   HGB 16.3 11/14/2011   HCT 48.0 11/14/2011   PLT 209.0 08/30/2011   CHOL 119 10/04/2011   TRIG 114.0 10/04/2011   HDL 34.50* 10/04/2011   ALT 17 10/04/2011   AST 21 10/04/2011   NA 142 11/14/2011   K 4.3 11/14/2011   CL 101 11/14/2011   CREATININE 0.90 11/14/2011   BUN 25* 11/14/2011   CO2 31 08/30/2011   TSH 1.212 02/21/2011   PSA 0.62 10/10/2010   INR 1.00 12/16/2010   HGBA1C  Value: 6.0 (NOTE)   The ADA recommends the following therapeutic goals for glycemic   control related to Hgb A1C measurement:   Goal of Therapy:   < 7.0% Hgb A1C   Action Suggested:  > 8.0% Hgb A1C   Ref:  Diabetes Care, 22, Suppl. 1, 1999 02/26/2007   Assessment & Plan:  See problem list. Medications and labs reviewed today.

## 2011-11-21 NOTE — Patient Instructions (Signed)
It was good to see you today. take amlodipine 5mg  2x/day (total 10mg  daily) and same losartan 50mg  2x/day continue with daily Demadex in place of lasix -  Other medications reviewed - no other changes Continue to monitor blood pressure 2 or 3x/day and call if not improving with these med changes - call if top >180 or bottom>100 Please schedule followup in 3-4 weeks, call sooner if problems.

## 2011-11-21 NOTE — Assessment & Plan Note (Signed)
Stopped amlodipine early 10/2011 due to mild dependant edema (now resolved) started ARB 10/2011 and titrated up to max Resumed low dose amlodipine late 10/2011 in addition to max ARB and other cardiac meds Resume max dose amlodipine 11/2011 with continue daily diuretics (changed lasix to demadex by UC 8/8)  BP Readings from Last 3 Encounters:  11/21/11 130/62  11/17/11 162/98  11/14/11 225/95

## 2011-11-22 NOTE — Progress Notes (Signed)
COPY MADE AND GIVEN TO ANGEL WHITE TO CLOSE

## 2011-11-24 ENCOUNTER — Telehealth: Payer: Self-pay | Admitting: *Deleted

## 2011-11-24 NOTE — Telephone Encounter (Signed)
Error--nt 

## 2011-12-05 ENCOUNTER — Other Ambulatory Visit: Payer: Self-pay | Admitting: Internal Medicine

## 2011-12-06 ENCOUNTER — Other Ambulatory Visit: Payer: Self-pay | Admitting: *Deleted

## 2011-12-06 MED ORDER — LOSARTAN POTASSIUM 50 MG PO TABS: 50.0000 mg | ORAL_TABLET | Freq: Two times a day (BID) | ORAL | Status: DC

## 2011-12-06 NOTE — Telephone Encounter (Signed)
Received fax stating his cozaar has been increase to 2 tabs daily. Needing new rx sent to [facility]. Sending updated script...Raechel Chute

## 2011-12-08 NOTE — Patient Instructions (Addendum)
Stitch removed from right occipital area per order from PCP [employer]. Site well healed no drainage no redness,

## 2011-12-11 ENCOUNTER — Other Ambulatory Visit: Payer: Self-pay | Admitting: Internal Medicine

## 2011-12-12 NOTE — Telephone Encounter (Signed)
Please advise if okay to refill. Thanks.  

## 2011-12-12 NOTE — Telephone Encounter (Signed)
Ok to refill 

## 2011-12-13 ENCOUNTER — Other Ambulatory Visit: Payer: Self-pay | Admitting: Internal Medicine

## 2011-12-18 ENCOUNTER — Encounter: Payer: Self-pay | Admitting: Internal Medicine

## 2011-12-18 ENCOUNTER — Ambulatory Visit (INDEPENDENT_AMBULATORY_CARE_PROVIDER_SITE_OTHER): Payer: Medicare Other | Admitting: Internal Medicine

## 2011-12-18 VITALS — BP 100/72 | HR 67 | Temp 98.4°F | Ht 63.5 in | Wt 162.0 lb

## 2011-12-18 DIAGNOSIS — J45998 Other asthma: Secondary | ICD-10-CM

## 2011-12-18 DIAGNOSIS — E785 Hyperlipidemia, unspecified: Secondary | ICD-10-CM

## 2011-12-18 DIAGNOSIS — J45909 Unspecified asthma, uncomplicated: Secondary | ICD-10-CM

## 2011-12-18 DIAGNOSIS — Z23 Encounter for immunization: Secondary | ICD-10-CM

## 2011-12-18 DIAGNOSIS — I10 Essential (primary) hypertension: Secondary | ICD-10-CM

## 2011-12-18 NOTE — Progress Notes (Signed)
Subjective:    Patient ID: [name]., male    DOB: [birth year], 78 y.o.   MRN: [id number]  Hypertension This is a chronic problem. The problem has been waxing and waning since onset. The problem is uncontrolled. Associated symptoms include anxiety. Pertinent negatives include no chest pain, headaches or shortness of breath. There are no associated agents to hypertension. Past treatments include calcium channel blockers, angiotensin blockers, beta blockers, central alpha agonists and diuretics. The current treatment provides significant improvement. There are no compliance problems.  Hypertensive end-organ damage includes CAD/MI. There is no history of CVA, heart failure or PVD.   ER visit for poor blood pressure control 8/8 reviewed  Also reviewed chronic medical issues today:  Severe COPD, chronic asthma, remote tobacco - follows with pulm for same - frequent flares related to weather change.  reports compliance with ongoing nebulizer treatment (steroid and Alb) but unable to afford Spiriva or Daliresp. Changed to [facility] spring 2013 -does not feel improved but denies adverse side effects related to current therapy.   CAD s/p CABG 2008, DES to saph v graft 06/2010 due to abn stress test - follows with cards for same - reports compliance with ongoing medical treatment and no changes in medication dose or frequency. denies adverse side effects related to current therapy. no chest pain or anginal symptoms   HTN - see above - reports compliance with ongoing medical treatment and no changes in medication dose or frequency. denies adverse side effects related to current therapy. no headache or weakness -  dyslipidemia - changed from simva 80mg  to prava 6/2011due to FDA warnings - reports compliance with ongoing medical treatment and no other changes in medication dose or frequency. denies adverse side effects related to current therapy. no muscle pain or weakness  Also episode of biliary  pancreatitis November 2012, followed by laparoscopic cholecystectomy 02/21/11 -   Past Medical History  Diagnosis Date  . CORONARY HEART DISEASE     a. s/p CABG;  b.  cath 06/27/10: S-Dx occluded, S-PDA 80-90% (tx with PCI); S-OM ok, L-LAD ok;  EF 65-70%  c.  s/p Promus DES to S-PDA 06/2010;   d. Myoview 8/12: low risk  . FIBRILLATION, ATRIAL     post op; ?documented during hosp. 06/2010  . SLEEP APNEA 09/2001    NPSG AHI 22/HR  . ALLERGIC RHINITIS   . ASTHMA   . Depression   . GERD     with HH, hx esophageal stricture  . DYSLIPIDEMIA   . HYPERTENSION     Echo 3/12: EF 55-60%; mod LVH; mild AS/AI; LAE; PASP 38; mild pulmo HTN  . Personal history of alcoholism   . Diverticulosis   . Benign liver cyst   . Skin cancer     L forearm  . Cataract     surgery to both eyes  . Esophageal stricture   . Hiatal hernia     Review of Systems  Constitutional: Negative for fever, fatigue and unexpected weight change.  Respiratory: Negative for cough and shortness of breath.   Cardiovascular: Negative for chest pain and leg swelling.  Neurological: Negative for dizziness, weakness, light-headedness, numbness and headaches.      Objective:   Physical Exam  BP 100/72  Pulse 67  Temp 98.4 F (36.9 C) (Oral)  Ht 5' 3.5" (1.613 m)  Wt 162 lb (73.483 kg)  BMI 28.25 kg/m2  SpO2 95% Wt Readings from Last 3 Encounters:  12/18/11 162 lb (73.483 kg)  11/21/11 157 lb 1.9 oz (71.269 kg)  11/17/11 155 lb 6.4 oz (70.489 kg)   Constitutional:  He appears well-developed and well-nourished. No acute distress. Wife at side Cardiovascular: Normal rate, irregular rhythm and normal heart sounds.  No murmur heard. no BLE edema Pulmonary/Chest: no increased effort at rest, decreased base breath sounds, no wheezes.  Neurological: he is alert and oriented to person, place, and time. No cranial nerve deficit. Coordination normal.   Psychiatric: he has a normal mood and affect. behavior is normal. Judgment  and thought content normal.     Lab Results  Component Value Date   WBC 10.0 08/30/2011   HGB 16.3 11/14/2011   HCT 48.0 11/14/2011   PLT 209.0 08/30/2011   CHOL 119 10/04/2011   TRIG 114.0 10/04/2011   HDL 34.50* 10/04/2011   ALT 17 10/04/2011   AST 21 10/04/2011   NA 142 11/14/2011   K 4.3 11/14/2011   CL 101 11/14/2011   CREATININE 0.90 11/14/2011   BUN 25* 11/14/2011   CO2 31 08/30/2011   TSH 1.212 02/21/2011   PSA 0.62 10/10/2010   INR 1.00 12/16/2010   HGBA1C  Value: 6.0 (NOTE)   The ADA recommends the following therapeutic goals for glycemic   control related to Hgb A1C measurement:   Goal of Therapy:   < 7.0% Hgb A1C   Action Suggested:  > 8.0% Hgb A1C   Ref:  Diabetes Care, 22, Suppl. 1, 1999 02/26/2007   Assessment & Plan:  See problem list. Medications and labs reviewed today.  Time spent with pt/family today 25 minutes, greater than 50% time spent counseling patient on hypertension and medication review. Also review of prior records

## 2011-12-18 NOTE — Patient Instructions (Signed)
It was good to see you today. Medications reviewed, no changes at this time. Continue to monitor blood pressure 2 x/day and call if top number over 160 Please schedule followup in 3-4 months, call sooner if problems. Hypertension Hypertension is another name for high blood pressure. High blood pressure may mean that your heart needs to work harder to pump blood. Blood pressure consists of two numbers, which includes a higher number over a lower number (example: 110/72). HOME CARE    Make lifestyle changes as told by your doctor. This may include weight loss and exercise.   Take your blood pressure medicine every day.   Limit how much salt you use.   Stop smoking if you smoke.   Do not use drugs.   Talk to your doctor if you are using decongestants or birth control pills. These medicines might make blood pressure higher.   Females should not drink more than 1 alcoholic drink per day. Males should not drink more than 2 alcoholic drinks per day.   See your doctor as told.  GET HELP RIGHT AWAY IF:    You have a blood pressure reading with a top number of 180 or higher.   You get a very bad headache.   You get blurred or changing vision.   You feel confused.   You feel weak, numb, or faint.   You get chest or belly (abdominal) pain.   You throw up (vomit).   You cannot breathe very well.  MAKE SURE YOU:    Understand these instructions.   Will watch your condition.   Will get help right away if you are not doing well or get worse.  Document Released: 09/13/2007 Document Revised: 03/16/2011 Document Reviewed: 09/13/2007 [facility] Patient Information 2012 [place], [place].

## 2011-12-18 NOTE — Assessment & Plan Note (Signed)
Mild flares with change in weather - but currently stable symptoms Uses chronic meds with pred taper prn, also follows with pulm The current medical regimen is effective;  continue present plan and medications.  

## 2011-12-18 NOTE — Assessment & Plan Note (Signed)
Stopped amlodipine early 10/2011 due to mild dependant edema (now resolved) started ARB 10/2011 and titrated up to max Resumed low dose amlodipine late 10/2011 in addition to max ARB and other cardiac meds Titrated to max dose amlodipine 11/2011 with continue daily diuretics   BP Readings from Last 3 Encounters:  12/18/11 100/72  11/21/11 130/62  11/17/11 162/98

## 2011-12-18 NOTE — Assessment & Plan Note (Signed)
On high dose prava - tolerating well, LDL<70 at goal The current medical regimen is effective;  continue present plan and medications.  

## 2011-12-19 ENCOUNTER — Telehealth: Payer: Self-pay | Admitting: Internal Medicine

## 2011-12-19 NOTE — Telephone Encounter (Signed)
Called [facility] at [phone]. Modafinil has been APPROVED through 03/19/2012 [facility] and they will contact the patient.

## 2011-12-21 ENCOUNTER — Ambulatory Visit (INDEPENDENT_AMBULATORY_CARE_PROVIDER_SITE_OTHER): Payer: Medicare Other

## 2011-12-21 DIAGNOSIS — J309 Allergic rhinitis, unspecified: Secondary | ICD-10-CM

## 2011-12-26 ENCOUNTER — Telehealth: Payer: Self-pay | Admitting: Internal Medicine

## 2011-12-26 MED ORDER — TORSEMIDE 20 MG PO TABS: 20.0000 mg | ORAL_TABLET | Freq: Every day | ORAL | Status: DC

## 2011-12-26 NOTE — Telephone Encounter (Signed)
Patients wife is requesting a refill on demadex sent to [facility] on [address]

## 2012-01-03 ENCOUNTER — Ambulatory Visit: Payer: Medicare Other | Admitting: Internal Medicine

## 2012-01-04 ENCOUNTER — Other Ambulatory Visit: Payer: Self-pay | Admitting: Cardiology

## 2012-01-04 ENCOUNTER — Other Ambulatory Visit: Payer: Self-pay | Admitting: Internal Medicine

## 2012-01-05 ENCOUNTER — Other Ambulatory Visit: Payer: Self-pay | Admitting: Dermatology

## 2012-01-05 ENCOUNTER — Other Ambulatory Visit: Payer: Self-pay | Admitting: Cardiology

## 2012-01-05 ENCOUNTER — Other Ambulatory Visit: Payer: Self-pay | Admitting: General Practice

## 2012-01-08 ENCOUNTER — Other Ambulatory Visit: Payer: Self-pay | Admitting: Internal Medicine

## 2012-01-08 ENCOUNTER — Encounter: Payer: Self-pay | Admitting: Internal Medicine

## 2012-01-08 ENCOUNTER — Ambulatory Visit (INDEPENDENT_AMBULATORY_CARE_PROVIDER_SITE_OTHER): Payer: Medicare Other | Admitting: Internal Medicine

## 2012-01-08 VITALS — BP 120/78 | HR 81 | Ht 62.0 in | Wt 155.6 lb

## 2012-01-08 DIAGNOSIS — G4733 Obstructive sleep apnea (adult) (pediatric): Secondary | ICD-10-CM

## 2012-01-08 DIAGNOSIS — J45909 Unspecified asthma, uncomplicated: Secondary | ICD-10-CM

## 2012-01-08 DIAGNOSIS — J45998 Other asthma: Secondary | ICD-10-CM

## 2012-01-08 MED ORDER — AMOXICILLIN 500 MG PO CAPS: 500.0000 mg | ORAL_CAPSULE | Freq: Three times a day (TID) | ORAL | Status: DC

## 2012-01-08 MED ORDER — ALBUTEROL SULFATE HFA 108 (90 BASE) MCG/ACT IN AERS: 2.0000 | INHALATION_SPRAY | RESPIRATORY_TRACT | Status: DC | PRN

## 2012-01-08 NOTE — Patient Instructions (Addendum)
Script sent for amoxacillin to have if needed at the beach.  Script sent to refill rescue inhaler

## 2012-01-08 NOTE — Progress Notes (Signed)
Patient ID: [name], male    DOB: 1930-07-08, 78 y.o.   MRN: [id number]  HPI 11/16/10- 47 year old man followed for severe chronic asthma, sleep apnea, allergic rhinitis, complicated by HBP CAD history of atrial fibrillation, GERD Last here May 20, 2010 CPAP 10 all night every night- helps sleep. Asks refill Provigil to help with long drives.  Allergy vaccine GO definitely helps- he is convinced.  He notes no asthma and no prednisone since last November- credits frequent handwashing, avoiding people with colds and the allergy vaccine. No new concerns. Continues cardiac rehab.   03/03/11-  37 year old man followed for severe chronic asthma, sleep apnea, allergic rhinitis, complicated by HBP CAD history of atrial fibrillation, GERD. Wife here. He recently had his fourth hospital stay since March, for gallbladder pain, cholecystectomy, cardiac stent. Cholecystectomy was 2 weeks ago. Through these he had shortness of breath and cough which has persisted and keep him awake. His nose runs.  07/03/11- 33 year old man followed for severe chronic asthma, sleep apnea, allergic rhinitis, complicated by HBP, CAD, history of atrial fibrillation, GERD. Wife here. Since last here has been to urgent care in emergency room 4 times for exacerbations of asthma. On prednisone 10 mg/day since February from Dr [name]. Definitely having reflux events and we discussed how that may be the significant issue. Denies sinus infection. Had chest x-rays in January and February which she was told were negative. He asks to try increasing his CPAP pressure because he doesn't feel he is sleeping quite well enough. We discussed this.  07/18/11- 11 year old man followed for severe chronic asthma, sleep apnea, allergic rhinitis, complicated by HBP, CAD, history of atrial fibrillation, GERD. Wife here. Seen by Dr Daub-Increased SOB and wheezing; was put on prednisone to help keep patient out of hospital; still not feeling  much better. Has been on prednisone 60 mg daily for past 4 days but still significant shortness of breath with exertion and wheeze. No excess 2 chest x-rays have been clear and EKG "knot heart". Using home nebulizer with DuoNeb 4 times daily plus his rescue inhaler at least twice per day. Nothing seems infected. He has not recognized heartburn although that has been an issue in the past. Denies chest pain, fever, ankle edema or palpitation. Some nasal congestion without much sneezing. Has been indoors without much exposure to pollen.  08/08/11- 44 year old man followed for severe chronic asthma, sleep apnea, allergic rhinitis, complicated by HBP, CAD, history of atrial fibrillation, GERD. Wife here. Hospitalized April 11-17 with acute exacerbation of his chronic fixed asthma. Anxiety and reflux are thought to be important. He says he is "doing great" now. He feels an anxiety medication would help and we discussed this. Currently on prednisone 20 mg daily.  10/04/11-  22 year old man followed for severe chronic asthma, sleep apnea, allergic rhinitis, complicated by HBP, CAD, history of atrial fibrillation, GERD. Wife here  Pt states increase of sob,wheezing,fatigue since getting out of hospital . No hospitalizations since last here. He stays short of breath especially with exertional dyspnea but is comfortable sitting and when lying in bed. His wife points out he is able to do yard work using a [employer]. We reviewed Dr.Hochrein's cardiology note. Question, , each of his dyspnea is pulmonary versus cardiac. We were asked to draw labs at this visit. He was criticized for using his nebulizer machine too frequently before his hospitalization but has only used it a total of 7 times since then. He had remained on maintenance prednisone  since January. His wife gradually tapered it off so he has had none in the last 3 weeks. He continues, and wants to continue, allergy vaccine at 1:10, well tolerated. CXR 07/22/11-    reviewed with them: Findings:  Grossly unchanged enlarged cardiac silhouette and mediastinal  contours post median sternotomy and CABG. Atherosclerotic  calcification within the thoracic aorta. There is persistent mild  elevation of the right hemidiaphragm. Grossly unchanged bibasilar  heterogeneous opacities favored to represent atelectasis. Grossly  unchanged bones including mild compression deformity of a lower  thoracic vertebral body.  IMPRESSION:  No acute cardiopulmonary disease.  Original Report Authenticated By: Waynard Reeds, M.D.  Office Spirometry: FEV1 1.40/58%, FVC 2.44/78%, FEV1/FEC oh 0.57/73%, FEF 25-75% 0.56/23%.  01/08/12- 35 year old man followed for severe chronic asthma, sleep apnea, allergic rhinitis, complicated by HBP, CAD, history of atrial fibrillation, GERD. Wife here Needs refill for rescue inhaler as he is leaving for beach in am; breathing is doing pretty well; only one treatment since seeing [place] last. . Asks for an antibiotic to carry.   Review of Systems-see HPI Constitutional:   No-   weight loss, night sweats, fevers, chills, fatigue, lassitude. HEENT:   No-  headaches, difficulty swallowing, tooth/dental problems, sore throat,       No-  sneezing, itching, ear ache, nasal congestion, +post nasal drip,  CV:  No-   chest pain, orthopnea, PND, swelling in lower extremities, anasarca, dizziness, palpitations Resp: +  shortness of breath with exertion or at rest.              No-   productive cough,  +non-productive cough,  No-  coughing up of blood.              No-   change in color of mucus. +wheezing.   Skin: No-   rash or lesions. GI:  No- recognized  heartburn, indigestion, abdominal pain, nausea, vomiting, GU: . MS:  No-   joint pain or swelling.   Neuro- nothing unusual:  Psych:  No- change in mood or affect. No depression, + anxiety.  No memory loss.  Objective:   Physical Exam BP 120/78  Pulse 81  Ht 5\' 2"  (1.575 m)  Wt 155 lb 9.6 oz  (70.58 kg)  BMI 28.46 kg/m2  SpO2 98% General- Alert, Oriented, Affect-appropriate, Distress- none acute Skin- rash-none, lesions- none, excoriation- none   [place] Lymphadenopathy- none Head- atraumatic            Eyes- Gross vision intact, PERRLA, conjunctivae clear secretions            Ears- Hearing, canals- hearing aid            Nose- Clear, No- Septal dev, mucus, polyps, erosion, perforation             Throat- Mallampati II , mucosa clear , drainage- none, tonsils- atrophic Neck- flexible , trachea midline, no stridor , thyroid nl, carotid no bruit Chest - symmetrical excursion , unlabored           Heart/CV- RRR , no murmur , no gallop  , no rub, nl s1 s2                           - JVD- none , edema- none, stasis changes- none, varices- none           Lung- clear except for mild wheeze right base, unlabored, cough- none , dullness-none, rub- none.  Chest wall- barrel chest Abd-  Br/ Gen/ Rectal- Not done, not indicated Extrem- cyanosis- none, clubbing, none, atrophy- none, strength- nl Neuro- grossly intact to observation

## 2012-01-16 ENCOUNTER — Encounter: Payer: Self-pay | Admitting: Internal Medicine

## 2012-01-16 NOTE — Assessment & Plan Note (Signed)
He continues good compliance and control. He will take his CPAP on travel.

## 2012-01-16 NOTE — Assessment & Plan Note (Signed)
Better control currently. Standby medications are reviewed.

## 2012-02-14 ENCOUNTER — Other Ambulatory Visit (INDEPENDENT_AMBULATORY_CARE_PROVIDER_SITE_OTHER): Payer: Medicare Other

## 2012-02-14 ENCOUNTER — Ambulatory Visit (INDEPENDENT_AMBULATORY_CARE_PROVIDER_SITE_OTHER): Payer: Medicare Other | Admitting: Internal Medicine

## 2012-02-14 ENCOUNTER — Encounter: Payer: Self-pay | Admitting: Internal Medicine

## 2012-02-14 VITALS — BP 142/70 | HR 80 | Temp 97.8°F | Ht 63.5 in | Wt 163.1 lb

## 2012-02-14 DIAGNOSIS — R5381 Other malaise: Secondary | ICD-10-CM

## 2012-02-14 DIAGNOSIS — Z131 Encounter for screening for diabetes mellitus: Secondary | ICD-10-CM

## 2012-02-14 DIAGNOSIS — R5383 Other fatigue: Secondary | ICD-10-CM

## 2012-02-14 DIAGNOSIS — Z0000 Encounter for general adult medical examination without abnormal findings: Secondary | ICD-10-CM

## 2012-02-14 DIAGNOSIS — I10 Essential (primary) hypertension: Secondary | ICD-10-CM

## 2012-02-14 DIAGNOSIS — Z79899 Other long term (current) drug therapy: Secondary | ICD-10-CM

## 2012-02-14 DIAGNOSIS — J441 Chronic obstructive pulmonary disease with (acute) exacerbation: Secondary | ICD-10-CM

## 2012-02-14 LAB — CBC WITH DIFFERENTIAL/PLATELET
Basophils Absolute: 0 10*3/uL (ref 0.0–0.1)
Eosinophils Relative: 0.5 % (ref 0.0–5.0)
Lymphs Abs: 1.7 10*3/uL (ref 0.7–4.0)
MCV: 87.8 fl (ref 78.0–100.0)
Monocytes Absolute: 0.9 10*3/uL (ref 0.1–1.0)
Monocytes Relative: 8.4 % (ref 3.0–12.0)
Neutrophils Relative %: 74.9 % (ref 43.0–77.0)
Platelets: 186 10*3/uL (ref 150.0–400.0)
RDW: 16.3 % — ABNORMAL HIGH (ref 11.5–14.6)
WBC: 10.5 10*3/uL (ref 4.5–10.5)

## 2012-02-14 LAB — HEPATIC FUNCTION PANEL
ALT: 18 U/L (ref 0–53)
Alkaline Phosphatase: 46 U/L (ref 39–117)
Bilirubin, Direct: 0.1 mg/dL (ref 0.0–0.3)
Total Bilirubin: 0.6 mg/dL (ref 0.3–1.2)
Total Protein: 6.4 g/dL (ref 6.0–8.3)

## 2012-02-14 LAB — BASIC METABOLIC PANEL
BUN: 22 mg/dL (ref 6–23)
Creatinine, Ser: 0.9 mg/dL (ref 0.4–1.5)
GFR: 86.52 mL/min (ref >60.00)
Glucose, Bld: 113 mg/dL — ABNORMAL HIGH (ref 70–99)

## 2012-02-14 LAB — HEMOGLOBIN A1C: Hgb A1c MFr Bld: 6.1 % (ref 4.6–6.5)

## 2012-02-14 NOTE — Progress Notes (Signed)
Subjective:    Patient ID: [name]., male    DOB: [birth year], 78 y.o.   MRN: [id number]  HPI  Here for medicare wellness  Diet: heart healthy  Physical activity: sedentary Depression/mood screen: negative Hearing: intact to whispered voice Visual acuity: grossly normal, performs annual eye exam  ADLs: capable Fall risk: none Home safety: good Cognitive evaluation: intact to orientation, naming, recall and repetition EOL planning: adv directives, full code/ I agree  I have personally reviewed and have noted 1. The patient's medical and social history 2. Their use of alcohol, tobacco or illicit drugs 3. Their current medications and supplements 4. The patient's functional ability including ADL's, fall risks, home safety risks and hearing or visual impairment. 5. Diet and physical activities 6. Evidence for depression or mood disorders  Also reviewed chronic medical issues today:  Severe COPD, chronic asthma, remote tobacco - follows with pulm for same - frequent flares related to weather change.  reports compliance with ongoing nebulizer treatment (steroid and Alb) but unable to afford Spiriva or Daliresp. Changed to [facility] spring 2013 -does not feel improved but denies adverse side effects related to current therapy. Currently on prednisone 20 mg daily for the last week but persisting wheezing and dry cough  CAD s/p CABG 2008, DES to saph v graft 06/2010 due to abn stress test - follows with cards for same - reports compliance with ongoing medical treatment and no changes in medication dose or frequency. denies adverse side effects related to current therapy. no chest pain or anginal symptoms   HTN -exacerbation summer 2013 with med changes reviewed- reports compliance with ongoing medical treatment. denies adverse side effects related to current therapy. no headache or weakness -  dyslipidemia - changed from simva 80mg  to prava 6/2011due to FDA warnings - reports  compliance with ongoing medical treatment and no other changes in medication dose or frequency. denies adverse side effects related to current therapy. no muscle pain or weakness  Also episode of biliary pancreatitis November 2012, followed by laparoscopic cholecystectomy 02/21/11 -   Past Medical History  Diagnosis Date  . CORONARY HEART DISEASE     a. s/p CABG;  b.  cath 06/27/10: S-Dx occluded, S-PDA 80-90% (tx with PCI); S-OM ok, L-LAD ok;  EF 65-70%  c.  s/p Promus DES to S-PDA 06/2010;   d. Myoview 8/12: low risk  . FIBRILLATION, ATRIAL     post op; ?documented during hosp. 06/2010  . SLEEP APNEA 09/2001    NPSG AHI 22/HR  . ALLERGIC RHINITIS   . ASTHMA   . Depression   . GERD     with HH, hx esophageal stricture  . DYSLIPIDEMIA   . HYPERTENSION     Echo 3/12: EF 55-60%; mod LVH; mild AS/AI; LAE; PASP 38; mild pulmo HTN  . Personal history of alcoholism   . Diverticulosis   . Benign liver cyst   . Skin cancer     L forearm  . Cataract     surgery to both eyes  . Esophageal stricture   . Hiatal hernia    Family History  Problem Relation Age of Onset  . COPD Sister   . Asthma Sister   . Emphysema Sister   . Hypertension Sister   . Hypertension Mother   . Colon cancer Neg Hx   . Heart disease Brother   . Heart disease Mother   . Heart disease Father    History  Substance Use Topics  .  Smoking status: Former Smoker -- 3.0 packs/day for 40 years    Types: Cigarettes    Quit date: 04/11/1979  . Smokeless tobacco: Never Used  . Alcohol Use: No    Review of Systems  Constitutional: Positive for fatigue. Negative for fever and unexpected weight change.  Respiratory: Positive for cough (dry), shortness of breath and wheezing.   Cardiovascular: Negative for chest pain and leg swelling.  Neurological: Negative for dizziness, weakness, light-headedness, numbness and headaches.  Gastrointestinal: Negative for abdominal pain, no bowel changes.  Musculoskeletal: Negative  for gait problem or joint swelling.  Skin: Negative for rash.  No other specific complaints in a complete review of systems (except as listed in HPI above).     Objective:   Physical Exam  BP 142/70  Pulse 80  Temp 97.8 F (36.6 C) (Oral)  Ht 5' 3.5" (1.613 m)  Wt 163 lb 1.9 oz (73.991 kg)  BMI 28.44 kg/m2  SpO2 98% Wt Readings from Last 3 Encounters:  02/14/12 163 lb 1.9 oz (73.991 kg)  01/08/12 155 lb 9.6 oz (70.58 kg)  12/18/11 162 lb (73.483 kg)   Constitutional:  He is short and overweight, appears well-developed and well-nourished. No distress.  Neck: Normal range of motion. Neck supple. No JVD present. No thyromegaly present.  Cardiovascular: Normal rate, regular rhythm and normal heart sounds.  No murmur heard. no BLE edema Pulmonary/Chest: resting mild dyspnea, worse with conversation - but near baseline. No respiratory distress. Mild inspiratory and expiratory wheeze but fair air movement B.  Abdominal: Soft. Bowel sounds are normal. Patient exhibits no distension. There is no tenderness.  Musculoskeletal: Normal range of motion. Patient exhibits no edema.  Neurological: he is alert and oriented to person, place, and time. No cranial nerve deficit. Coordination normal.  Skin: Solar and senile changes, tattooed forearms. Skin is warm and dry.  No erythema or ulceration.  Psychiatric: he has a normal mood and affect. behavior is normal. Judgment and thought content normal.       Lab Results  Component Value Date   WBC 10.0 08/30/2011   HGB 16.3 11/14/2011   HCT 48.0 11/14/2011   PLT 209.0 08/30/2011   CHOL 119 10/04/2011   TRIG 114.0 10/04/2011   HDL 34.50* 10/04/2011   ALT 17 10/04/2011   AST 21 10/04/2011   NA 142 11/14/2011   K 4.3 11/14/2011   CL 101 11/14/2011   CREATININE 0.90 11/14/2011   BUN 25* 11/14/2011   CO2 31 08/30/2011   TSH 1.212 02/21/2011   PSA 0.62 10/10/2010   INR 1.00 12/16/2010   HGBA1C  Value: 6.0 (NOTE)   The ADA recommends the following therapeutic goals  for glycemic   control related to Hgb A1C measurement:   Goal of Therapy:   < 7.0% Hgb A1C   Action Suggested:  > 8.0% Hgb A1C   Ref:  Diabetes Care, 22, Suppl. 1, 1999 02/26/2007    Assessment & Plan:   AWV/v70.0 - Today patient counseled on age appropriate routine health concerns for screening and prevention, each reviewed and up to date or declined. Immunizations reviewed and up to date or declined. Labs/ECG reviewed. Risk factors for depression reviewed and negative. Hearing function and visual acuity are intact. ADLs screened and addressed as needed. Functional ability and level of safety reviewed and appropriate. Education, counseling and referrals performed based on assessed risks today. Patient provided with a copy of personalized plan for preventive services.  Fatigue - suspect related to advanced pulmonary  disease, otherwise nonspecific symptoms/exam - check screening labs  Screened for diabetes given history hyperglycemia and frequent steroid use -a1c today  acute on chronic bronchitis with COPD exacerbation -albuterol neb provided in office today. Continue prednisone as ongoing and encouraged to take antibiotics as prescribed by pulmonary.   Also see problem list. Medications and labs reviewed today.

## 2012-02-14 NOTE — Assessment & Plan Note (Signed)
Temporarily stopped amlodipine early 10/2011 due to mild dependant edema (now resolved) started ARB 10/2011 and titrated to max Resumed low dose amlodipine late 10/2011, then titrated to max dose amlodipine 11/2011 with continue daily diuretics  Improved The current medical regimen is effective;  continue present plan and medications.  BP Readings from Last 3 Encounters:  02/14/12 142/70  01/08/12 120/78  12/18/11 100/72

## 2012-02-14 NOTE — Patient Instructions (Signed)
It was good to see you today. We have reviewed your prior records including labs and tests today Health Maintenance reviewed - all recommended immunizations and age-appropriate screenings are up-to-date. Medications reviewed, no changes at this time. Nebulizer treatment given to you in the office today for your breathing and wheeze Test(s) ordered today. Your results will be released to MyChart (or called to you) after review, usually within 72hours after test completion. If any changes need to be made, you will be notified at that same time. Please schedule followup in 4 months for blood pressure check and review, call sooner if problems.

## 2012-02-24 ENCOUNTER — Other Ambulatory Visit: Payer: Self-pay | Admitting: Internal Medicine

## 2012-03-15 ENCOUNTER — Other Ambulatory Visit: Payer: Self-pay | Admitting: Cardiology

## 2012-03-15 ENCOUNTER — Telehealth: Payer: Self-pay | Admitting: Internal Medicine

## 2012-03-15 NOTE — Telephone Encounter (Signed)
Pt is requesting a refill of the lorazepam 0.5 mg  .  CY please advise. Thanks  Last ov--01/08/2012 Next ov--05/08/2012  Allergies  Allergen Reactions  . Codeine Nausea And Vomiting

## 2012-03-15 NOTE — Telephone Encounter (Signed)
New Problem:    Called in needing a refill of her husband's torsemide (DEMADEX) 20 MG tablet.

## 2012-03-16 NOTE — Telephone Encounter (Signed)
Ok to refill 

## 2012-03-18 ENCOUNTER — Other Ambulatory Visit: Payer: Self-pay | Admitting: Internal Medicine

## 2012-03-18 MED ORDER — LORAZEPAM 0.5 MG PO TABS: 0.5000 mg | ORAL_TABLET | Freq: Two times a day (BID) | ORAL | Status: DC | PRN

## 2012-03-18 NOTE — Telephone Encounter (Signed)
Spoke with patients spouse, made her aware Rx has been sent in to verified pharmacy.  Nothing further needed at this time.

## 2012-03-20 MED ORDER — TORSEMIDE 20 MG PO TABS: 20.0000 mg | ORAL_TABLET | Freq: Every day | ORAL | Status: DC

## 2012-03-23 ENCOUNTER — Other Ambulatory Visit: Payer: Self-pay | Admitting: Internal Medicine

## 2012-04-04 ENCOUNTER — Ambulatory Visit (INDEPENDENT_AMBULATORY_CARE_PROVIDER_SITE_OTHER): Payer: Medicare Other | Admitting: Emergency Medicine

## 2012-04-04 VITALS — BP 167/80 | HR 95 | Temp 98.0°F | Resp 18 | Ht 62.0 in | Wt 160.2 lb

## 2012-04-04 DIAGNOSIS — R05 Cough: Secondary | ICD-10-CM

## 2012-04-04 DIAGNOSIS — J40 Bronchitis, not specified as acute or chronic: Secondary | ICD-10-CM

## 2012-04-04 DIAGNOSIS — R0602 Shortness of breath: Secondary | ICD-10-CM

## 2012-04-04 DIAGNOSIS — J45909 Unspecified asthma, uncomplicated: Secondary | ICD-10-CM

## 2012-04-04 MED ORDER — METHYLPREDNISOLONE SODIUM SUCC 125 MG IJ SOLR
125.0000 mg | Freq: Once | INTRAMUSCULAR | Status: AC
  Administered 2012-04-04: 125 mg via INTRAMUSCULAR

## 2012-04-04 MED ORDER — IPRATROPIUM BROMIDE 0.02 % IN SOLN
0.5000 mg | Freq: Once | RESPIRATORY_TRACT | Status: AC
  Administered 2012-04-04: 0.5 mg via RESPIRATORY_TRACT

## 2012-04-04 MED ORDER — ALBUTEROL SULFATE (2.5 MG/3ML) 0.083% IN NEBU
2.5000 mg | INHALATION_SOLUTION | Freq: Once | RESPIRATORY_TRACT | Status: AC
  Administered 2012-04-04: 2.5 mg via RESPIRATORY_TRACT

## 2012-04-04 MED ORDER — PREDNISONE 20 MG PO TABS: ORAL_TABLET | ORAL | Status: DC

## 2012-04-04 MED ORDER — AMOXICILLIN 875 MG PO TABS: 875.0000 mg | ORAL_TABLET | Freq: Two times a day (BID) | ORAL | Status: DC

## 2012-04-04 NOTE — Patient Instructions (Addendum)
If you have worsening in your breathing please go to the emergency room for further management .

## 2012-04-04 NOTE — Progress Notes (Signed)
  Subjective:    Patient ID: [name]., male    DOB: 20-May-1930, 78 y.o.   MRN: [id number]  HPI 78 yr old pt here with asthma and wheezing started , stoped smoking 35 yrs ago. Pt usually has trouble in the winter, pt used breathing treatment 2 hours ago at home, with no relief. Patient has chronic severe asthma. He has frequent exacerbations. He sees primarily Dr. Jetty Duhamel. He was hospitalized for about a week in the spring for an exacerbation that did not respond to prednisone. He has a DuoNeb nebulizer at home he is not a smoker and has not smoked for many many years. He denies fever or chills or discoloration to his phlegm he has a history of atrial fibrillation.    Review of Systems     Objective:   Physical Exam patient is alert cooperative not toxic appearing. He does not appear air hungry. Chest exam reveals increased AP diameter with bilateral wheezes and prolonged expiration. He is not in atrial fibrillation he has a regular rate and rhythm at the present time        Assessment & Plan:  Patient here with chronic severe recurrent asthma. He has a dual neb he can use at home we'll go ahead and get 125 of Solu-Medrol IM and put on prednisone taper over the next 12 days. Case discussed with Dr. Maple Hudson he is aware of  the treatment plan. We'll also give amoxicillin 875 twice a day for 10 days.

## 2012-04-16 ENCOUNTER — Encounter: Payer: Self-pay | Admitting: Cardiology

## 2012-04-16 ENCOUNTER — Ambulatory Visit (INDEPENDENT_AMBULATORY_CARE_PROVIDER_SITE_OTHER): Payer: Medicare Other | Admitting: Cardiology

## 2012-04-16 VITALS — BP 115/80 | HR 75 | Ht 62.0 in | Wt 165.0 lb

## 2012-04-16 DIAGNOSIS — E785 Hyperlipidemia, unspecified: Secondary | ICD-10-CM

## 2012-04-16 DIAGNOSIS — I4891 Unspecified atrial fibrillation: Secondary | ICD-10-CM

## 2012-04-16 DIAGNOSIS — R0989 Other specified symptoms and signs involving the circulatory and respiratory systems: Secondary | ICD-10-CM

## 2012-04-16 DIAGNOSIS — I2510 Atherosclerotic heart disease of native coronary artery without angina pectoris: Secondary | ICD-10-CM

## 2012-04-16 DIAGNOSIS — I10 Essential (primary) hypertension: Secondary | ICD-10-CM

## 2012-04-16 NOTE — Progress Notes (Signed)
HPI The patient presents for followup after PCI of the vein graft in 2012.   Since I last saw him he has continued to have problems with asthma. He is just getting over a course of therapy for this. He does get occasional chest discomfort he thinks related to taking his steroids. He might take a rare nitroglycerin and some medications were acid. He denies any ongoing symptoms consistent with previous angina. He has no palpitations, presyncope or syncope. He has no chest pressure, neck or arm discomfort. He has not been able to go to the [facility] because of his asthma. However, he does notice that with recent activities he's had some burning discomfort in his thighs with walking.  Allergies  Allergen Reactions  . Codeine Nausea And Vomiting    Current Outpatient Prescriptions  Medication Sig Dispense Refill  . acidophilus (RISAQUAD) CAPS Take 1 capsule by mouth daily. [facility] probiotics      . albuterol (PROVENTIL HFA;VENTOLIN HFA) 108 (90 BASE) MCG/ACT inhaler Inhale 2 puffs into the lungs every 4 (four) hours as needed for wheezing or shortness of breath. For asthma  1 Inhaler  prn  . amLODipine (NORVASC) 5 MG tablet Take 1 tablet (5 mg total) by mouth 2 (two) times daily.  60 tablet  11  . aspirin 81 MG tablet Take 81 mg by mouth daily.       . bisoprolol (ZEBETA) 10 MG tablet Take 1 tablet (10 mg total) by mouth daily.  30 tablet  6  . budesonide (PULMICORT) 0.25 MG/2ML nebulizer solution Take 0.25 mg by nebulization 2 (two) times daily as needed. For asthma      . cetirizine (ZYRTEC) 10 MG tablet Take 10 mg by mouth daily.        . cholestyramine (QUESTRAN) 4 G packet Take 1 packet by mouth 2 (two) times daily as needed. For loose stools      . cloNIDine (CATAPRES) 0.3 MG tablet take 2 tablets by mouth twice a day  120 tablet  6  . famotidine (PEPCID) 20 MG tablet take 1 tablet by mouth at bedtime  30 tablet  0  . ipratropium-albuterol (DUONEB) 0.5-2.5 (3) MG/3ML SOLN Take 3 mLs by  nebulization every 6 (six) hours as needed. For asthma      . KLOR-CON M20 20 MEQ tablet take 1 tablet by mouth twice a day  60 each  4  . latanoprost (XALATAN) 0.005 % ophthalmic solution Place 1 drop into both eyes at bedtime.       Marland Kitchen loperamide (IMODIUM A-D) 2 MG tablet Take 2 mg by mouth as needed. For loose stool      . LORazepam (ATIVAN) 0.5 MG tablet Take 1 tablet (0.5 mg total) by mouth 2 (two) times daily as needed. For anxiety  60 tablet  5  . losartan (COZAAR) 50 MG tablet Take 1 tablet (50 mg total) by mouth 2 (two) times daily.  180 tablet  3  . modafinil (PROVIGIL) 200 MG tablet       . montelukast (SINGULAIR) 10 MG tablet take 1 tablet by mouth at bedtime  30 tablet  11  . nitroGLYCERIN (NITROSTAT) 0.4 MG SL tablet take as directed  25 tablet  11  . pravastatin (PRAVACHOL) 80 MG tablet Take 80 mg by mouth daily.      Marland Kitchen torsemide (DEMADEX) 20 MG tablet Take 1 tablet (20 mg total) by mouth daily.  30 tablet  5    Past Medical History  Diagnosis Date  . CORONARY HEART DISEASE     a. s/p CABG;  b.  cath 06/27/10: S-Dx occluded, S-PDA 80-90% (tx with PCI); S-OM ok, L-LAD ok;  EF 65-70%  c.  s/p Promus DES to S-PDA 06/2010;   d. Myoview 8/12: low risk  . FIBRILLATION, ATRIAL     post op; ?documented during hosp. 06/2010  . SLEEP APNEA 09/2001    NPSG AHI 22/HR  . ALLERGIC RHINITIS   . ASTHMA   . Depression   . GERD     with HH, hx esophageal stricture  . DYSLIPIDEMIA   . HYPERTENSION     Echo 3/12: EF 55-60%; mod LVH; mild AS/AI; LAE; PASP 38; mild pulmo HTN  . Personal history of alcoholism   . Diverticulosis   . Benign liver cyst   . Skin cancer     L forearm  . Cataract     surgery to both eyes  . Esophageal stricture   . Hiatal hernia     Past Surgical History  Procedure Date  . Coronary artery bypass graft 02/2007  . Hernia repair 12/03/07  . Tonsillectomy   . Coronary angioplasty with stent placement 07/2010  . Cataract extraction, bilateral 2007  . Hernia  repair unsure ?60's  . Cholecystectomy 02/21/2011    Procedure: LAPAROSCOPIC CHOLECYSTECTOMY WITH INTRAOPERATIVE CHOLANGIOGRAM;  Surgeon: Valarie Merino, MD;  Location: [facility]S;  Service: General;  Laterality: N/A;    ROS:  As stated in the HPI and negative for all other systems.  PHYSICAL EXAM BP 115/80  Pulse 75  Ht 5\' 2"  (1.575 m)  Wt 165 lb (74.844 kg)  BMI 30.18 kg/m2 GENERAL:  Chronically ill appearing but in no acute distress HEENT:  Pupils equal round and reactive, fundi not visualized, oral mucosa unremarkable, dentures NECK:  No jugular venous distention, waveform within normal limits, carotid upstroke brisk and symmetric, bilateral soft carotid bruits, no thyromegaly LUNGS:  Clear to auscultation bilaterally BACK:  No CVA tenderness CHEST:  Well healed sternotomy scar. HEART:  PMI not displaced or sustained,S1 and S2 within normal limits, no S3, no S4, no clicks, no rubs, soft apical, right upper sterna border early peaking murmur ABD:  Flat, positive bowel sounds normal in frequency in pitch, no bruits, no rebound, no guarding, no midline pulsatile mass, no hepatomegaly, no splenomegaly, healed abdominal scars. EXT:  2 plus pulses upper, mildly diminished lower, mild bilateral edema, no cyanosis no clubbing SKIN:  No rashes no nodules, multiple bruises  EKG:  Sinus rhythm, rate 75, axis within normal limits, RSR prime V1, nonspecific T-wave changes. 04/16/2012  ASSESSMENT AND PLAN  CORONARY HEART DISEASE -  He is having no symptoms consistent with his previous angina. His dyspnea seems to be related to asthma. No further workup is suggested.   DYSLIPIDEMIA -  His last LDL was 62. He will remain on the meds as listed.  Bruit of left carotid artery -  The patient had 0-39% right stenosis and 40-59% left stenosis. He will have followup Dopplers in July.  Leg pain - He does have mildly diminished pulses. I will check bilateral ABIs.

## 2012-04-16 NOTE — Patient Instructions (Addendum)
The current medical regimen is effective;  continue present plan and medications.  Your physician has requested that you have an ankle brachial index (ABI). During this test an ultrasound and blood pressure cuff are used to evaluate the arteries that supply the arms and legs with blood. Allow thirty minutes for this exam. There are no restrictions or special instructions.  Follow up in 1 year with Dr Hochrein.  You will receive a letter in the mail 2 months before you are due.  Please call us when you receive this letter to schedule your follow up appointment.  

## 2012-04-23 ENCOUNTER — Other Ambulatory Visit: Payer: Self-pay | Admitting: *Deleted

## 2012-04-23 MED ORDER — FAMOTIDINE 20 MG PO TABS: ORAL_TABLET | ORAL | Status: DC

## 2012-04-25 ENCOUNTER — Telehealth: Payer: Self-pay | Admitting: *Deleted

## 2012-04-25 DIAGNOSIS — H532 Diplopia: Secondary | ICD-10-CM

## 2012-04-25 NOTE — Telephone Encounter (Signed)
Ok - ordered entered

## 2012-04-25 NOTE — Telephone Encounter (Signed)
Dr. Elmer Picker wants to know if pt can come here to have Acetylcholine Receptor Antibody lab work done (dx diplopia) tomorrow morning. They also will fax order over. (Fax # [phone])

## 2012-04-25 NOTE — Telephone Encounter (Signed)
Dr. Deirdre Evener office informed.

## 2012-04-26 ENCOUNTER — Encounter (INDEPENDENT_AMBULATORY_CARE_PROVIDER_SITE_OTHER): Payer: Medicare Other

## 2012-04-26 ENCOUNTER — Ambulatory Visit (INDEPENDENT_AMBULATORY_CARE_PROVIDER_SITE_OTHER): Payer: Medicare Other | Admitting: Internal Medicine

## 2012-04-26 ENCOUNTER — Other Ambulatory Visit (INDEPENDENT_AMBULATORY_CARE_PROVIDER_SITE_OTHER): Payer: Medicare Other

## 2012-04-26 ENCOUNTER — Encounter: Payer: Self-pay | Admitting: Internal Medicine

## 2012-04-26 VITALS — BP 132/68 | HR 85 | Temp 97.4°F | Resp 10 | Wt 162.0 lb

## 2012-04-26 DIAGNOSIS — H532 Diplopia: Secondary | ICD-10-CM

## 2012-04-26 DIAGNOSIS — I739 Peripheral vascular disease, unspecified: Secondary | ICD-10-CM

## 2012-04-26 DIAGNOSIS — J42 Unspecified chronic bronchitis: Secondary | ICD-10-CM

## 2012-04-26 DIAGNOSIS — R233 Spontaneous ecchymoses: Secondary | ICD-10-CM

## 2012-04-26 DIAGNOSIS — R0989 Other specified symptoms and signs involving the circulatory and respiratory systems: Secondary | ICD-10-CM

## 2012-04-26 LAB — CBC WITH DIFFERENTIAL/PLATELET
Basophils Absolute: 0 10*3/uL (ref 0.0–0.1)
Basophils Relative: 0 % (ref 0.0–3.0)
Eosinophils Absolute: 0.1 10*3/uL (ref 0.0–0.7)
Lymphocytes Relative: 17.3 % (ref 12.0–46.0)
MCHC: 33.8 g/dL (ref 30.0–36.0)
MCV: 84.2 fl (ref 78.0–100.0)
Monocytes Absolute: 0.5 10*3/uL (ref 0.1–1.0)
Neutrophils Relative %: 76.1 % (ref 43.0–77.0)
RBC: 4.25 Mil/uL (ref 4.22–5.81)
RDW: 15.2 % — ABNORMAL HIGH (ref 11.5–14.6)

## 2012-04-26 LAB — PROTIME-INR: INR: 1 ratio (ref 0.8–1.0)

## 2012-04-26 NOTE — Progress Notes (Signed)
Subjective:    Patient ID: [name]., male    DOB: 19-Feb-1931, 79 y.o.   MRN: [id number]  HPI  complains of double vision x 2 weeks, worse at end of day Also "black eye" on r side without trauma  Also reviewed chronic medical issues today:  Severe COPD, chronic asthma, remote tobacco - follows with pulm for same - frequent flares related to weather change.  reports compliance with ongoing nebulizer treatment (steroid and Alb) but unable to afford Spiriva or Daliresp. Changed to [facility] spring 2013 -does not feel improved but denies adverse side effects related to current therapy. S/p recent mox/pred with improved wheezing and dry cough  CAD s/p CABG 2008, DES to saph v graft 06/2010 due to abn stress test - follows with cards for same - reports compliance with ongoing medical treatment and no changes in medication dose or frequency. denies adverse side effects related to current therapy. no chest pain or anginal symptoms   HTN -exacerbation summer 2013 with med changes reviewed- reports compliance with ongoing medical treatment. denies adverse side effects related to current therapy. no headache or weakness -  dyslipidemia - changed from simva 80mg  to prava 6/2011due to FDA warnings - reports compliance with ongoing medical treatment and no other changes in medication dose or frequency. denies adverse side effects related to current therapy. no muscle pain or weakness  Also episode of biliary pancreatitis November 2012, followed by laparoscopic cholecystectomy 02/21/11 -   Past Medical History  Diagnosis Date  . CORONARY HEART DISEASE     a. s/p CABG;  b.  cath 06/27/10: S-Dx occluded, S-PDA 80-90% (tx with PCI); S-OM ok, L-LAD ok;  EF 65-70%  c.  s/p Promus DES to S-PDA 06/2010;   d. Myoview 8/12: low risk  . FIBRILLATION, ATRIAL     post op; ?documented during hosp. 06/2010  . SLEEP APNEA 09/2001    NPSG AHI 22/HR  . ALLERGIC RHINITIS   . ASTHMA   . Depression   . GERD     with  HH, hx esophageal stricture  . DYSLIPIDEMIA   . HYPERTENSION     Echo 3/12: EF 55-60%; mod LVH; mild AS/AI; LAE; PASP 38; mild pulmo HTN  . Personal history of alcoholism   . Diverticulosis   . Benign liver cyst   . Skin cancer     L forearm  . Cataract     surgery to both eyes  . Esophageal stricture   . Hiatal hernia     Review of Systems  Constitutional: Positive for fatigue. Negative for fever and unexpected weight change.  HENT: Negative for trouble swallowing and voice change.   Eyes: Positive for visual disturbance (double vision at end of day >2 weeks, s/p optho eval 12/16 for same). Negative for photophobia.  Respiratory: Positive for cough (dry) and shortness of breath (chronic). Negative for wheezing.   Cardiovascular: Negative for chest pain and leg swelling.  Musculoskeletal: Negative for myalgias and arthralgias.  Skin:       Easy bruising - new spontaneous black eye R side i past 48h  Neurological: Negative for dizziness, weakness, light-headedness, numbness and headaches.      Objective:   Physical Exam  BP 132/68  Pulse 85  Temp 97.4 F (36.3 C) (Oral)  Resp 10  Wt 162 lb 0.6 oz (73.501 kg)  SpO2 98% Wt Readings from Last 3 Encounters:  04/26/12 162 lb 0.6 oz (73.501 kg)  04/16/12 165 lb (74.844 kg)  04/04/12 160 lb 3.2 oz (72.666 kg)   Constitutional:  He is short and overweight, appears well-developed and well-nourished. No distress. Wife at side Cardiovascular: Normal rate, regular rhythm and normal heart sounds.  No murmur heard. no BLE edema Pulmonary/Chest: resting mild dyspnea, worse with conversation - but at baseline. No respiratory distress. Mild inspiratory and expiratory wheeze but fair air movement B.  Skin: Solar and senile changes, chronic hemo chromatic and tattooed forearms. Skin is warm and dry.  No erythema or ulceration.  Psychiatric: he has a normal mood and affect. behavior is normal. Judgment and thought content normal.      Lab  Results  Component Value Date   WBC 10.5 02/14/2012   HGB 13.1 02/14/2012   HCT 39.9 02/14/2012   PLT 186.0 02/14/2012   CHOL 119 10/04/2011   TRIG 114.0 10/04/2011   HDL 34.50* 10/04/2011   ALT 18 02/14/2012   AST 15 02/14/2012   NA 142 02/14/2012   K 4.0 02/14/2012   CL 105 02/14/2012   CREATININE 0.9 02/14/2012   BUN 22 02/14/2012   CO2 29 02/14/2012   TSH 1.08 02/14/2012   PSA 0.62 10/10/2010   INR 1.00 12/16/2010   HGBA1C 6.1 02/14/2012    Assessment & Plan:   Diplopia - concern by optho for possible occular MG - neuro exam benign today and h otherwise unremarkable. Check AchR Ab - further treatment to depend on results and symptoms  Spontaneous periorbital hematoma R side, no direct trauma - suspect related to coughing (increase pressure) - on ASA 81 and recent/frequent pred -check coags and CBC but suspect related to meds and "trauma"  chronic bronchitis with recent COPD exacerbation - follow up with pulmonary. At baseline today

## 2012-04-26 NOTE — Patient Instructions (Signed)
It was good to see you today. We have reviewed your prior records including labs and tests today Test(s) ordered today. Your results will be released to MyChart (or called to you) after review, usually within 72hours after test completion. If any changes need to be made, you will be notified at that same time. Will also let Dr Elmer Picker know these results Medications reviewed and updated, no changes at this time.

## 2012-04-29 LAB — ACETYLCHOLINE RECEPTOR, BINDING: A CHR BINDING ABS: <0.3 nmol/L (ref <=0.30)

## 2012-05-03 ENCOUNTER — Telehealth: Payer: Self-pay | Admitting: Cardiology

## 2012-05-03 NOTE — Telephone Encounter (Signed)
New problem:   Wife calling   Test results.

## 2012-05-06 NOTE — Telephone Encounter (Signed)
Per wife - reviewed results of ABI's with her and pt as he doesn't hear that well on the phone.  They state understanding but are concerned that he is still having pain in his thigh area with walking or standing for any length of time.  Aware I will review with MD and call them back with information and possible follow up

## 2012-05-06 NOTE — Telephone Encounter (Signed)
This could be a back or nerve problem.  Follow with primary MD.

## 2012-05-08 ENCOUNTER — Ambulatory Visit: Payer: Medicare Other | Admitting: Internal Medicine

## 2012-05-08 NOTE — Telephone Encounter (Signed)
Wife aware for pt to follow up with PCP

## 2012-05-10 ENCOUNTER — Ambulatory Visit (INDEPENDENT_AMBULATORY_CARE_PROVIDER_SITE_OTHER): Payer: Medicare Other | Admitting: Adult Health

## 2012-05-10 ENCOUNTER — Encounter: Payer: Self-pay | Admitting: Adult Health

## 2012-05-10 ENCOUNTER — Telehealth: Payer: Self-pay | Admitting: Internal Medicine

## 2012-05-10 VITALS — BP 124/82 | HR 79 | Temp 98.3°F | Ht 62.0 in | Wt 162.0 lb

## 2012-05-10 DIAGNOSIS — J45901 Unspecified asthma with (acute) exacerbation: Secondary | ICD-10-CM | POA: Insufficient documentation

## 2012-05-10 DIAGNOSIS — J45902 Unspecified asthma with status asthmaticus: Secondary | ICD-10-CM

## 2012-05-10 MED ORDER — PREDNISONE 10 MG PO TABS: ORAL_TABLET | ORAL | Status: DC

## 2012-05-10 MED ORDER — METHYLPREDNISOLONE ACETATE 80 MG/ML IJ SUSP
120.0000 mg | Freq: Once | INTRAMUSCULAR | Status: AC
  Administered 2012-05-10: 120 mg via INTRAMUSCULAR

## 2012-05-10 MED ORDER — LEVALBUTEROL HCL 0.63 MG/3ML IN NEBU
0.6300 mg | INHALATION_SOLUTION | Freq: Once | RESPIRATORY_TRACT | Status: AC
  Administered 2012-05-10: 0.63 mg via RESPIRATORY_TRACT

## 2012-05-10 NOTE — Patient Instructions (Addendum)
Prednisone taper. Over the next week Mucinex DM twice daily as needed. For cough and congestion. Depo-Medrol shot today in the office. Follow with Dr. Maple Hudson in 2 weeks and as needed  If patient does not improve or worsens we'll need to seek emergency care Please contact office for sooner follow up if symptoms do not improve or worsen or seek emergency care

## 2012-05-10 NOTE — Progress Notes (Signed)
Patient ID: THAILAND DUBE, male    DOB: 1930-07-08, 79 y.o.   MRN: [id number]  HPI 11/16/10- 47 year old man followed for severe chronic asthma, sleep apnea, allergic rhinitis, complicated by HBP CAD history of atrial fibrillation, GERD Last here May 20, 2010 CPAP 10 all night every night- helps sleep. Asks refill Provigil to help with long drives.  Allergy vaccine GO definitely helps- he is convinced.  He notes no asthma and no prednisone since last November- credits frequent handwashing, avoiding people with colds and the allergy vaccine. No new concerns. Continues cardiac rehab.   03/03/11-  37 year old man followed for severe chronic asthma, sleep apnea, allergic rhinitis, complicated by HBP CAD history of atrial fibrillation, GERD. Wife here. He recently had his fourth hospital stay since March, for gallbladder pain, cholecystectomy, cardiac stent. Cholecystectomy was 2 weeks ago. Through these he had shortness of breath and cough which has persisted and keep him awake. His nose runs.  07/03/11- 33 year old man followed for severe chronic asthma, sleep apnea, allergic rhinitis, complicated by HBP, CAD, history of atrial fibrillation, GERD. Wife here. Since last here has been to urgent care in emergency room 4 times for exacerbations of asthma. On prednisone 10 mg/day since February from Dr [name]. Definitely having reflux events and we discussed how that may be the significant issue. Denies sinus infection. Had chest x-rays in January and February which she was told were negative. He asks to try increasing his CPAP pressure because he doesn't feel he is sleeping quite well enough. We discussed this.  07/18/11- 11 year old man followed for severe chronic asthma, sleep apnea, allergic rhinitis, complicated by HBP, CAD, history of atrial fibrillation, GERD. Wife here. Seen by Dr Daub-Increased SOB and wheezing; was put on prednisone to help keep patient out of hospital; still not feeling  much better. Has been on prednisone 60 mg daily for past 4 days but still significant shortness of breath with exertion and wheeze. No excess 2 chest x-rays have been clear and EKG "knot heart". Using home nebulizer with DuoNeb 4 times daily plus his rescue inhaler at least twice per day. Nothing seems infected. He has not recognized heartburn although that has been an issue in the past. Denies chest pain, fever, ankle edema or palpitation. Some nasal congestion without much sneezing. Has been indoors without much exposure to pollen.  08/08/11- 44 year old man followed for severe chronic asthma, sleep apnea, allergic rhinitis, complicated by HBP, CAD, history of atrial fibrillation, GERD. Wife here. Hospitalized April 11-17 with acute exacerbation of his chronic fixed asthma. Anxiety and reflux are thought to be important. He says he is "doing great" now. He feels an anxiety medication would help and we discussed this. Currently on prednisone 20 mg daily.  10/04/11-  22 year old man followed for severe chronic asthma, sleep apnea, allergic rhinitis, complicated by HBP, CAD, history of atrial fibrillation, GERD. Wife here  Pt states increase of sob,wheezing,fatigue since getting out of hospital . No hospitalizations since last here. He stays short of breath especially with exertional dyspnea but is comfortable sitting and when lying in bed. His wife points out he is able to do yard work using a [employer]. We reviewed Dr.Hochrein's cardiology note. Question, , each of his dyspnea is pulmonary versus cardiac. We were asked to draw labs at this visit. He was criticized for using his nebulizer machine too frequently before his hospitalization but has only used it a total of 7 times since then. He had remained on maintenance prednisone  since January. His wife gradually tapered it off so he has had none in the last 3 weeks. He continues, and wants to continue, allergy vaccine at 1:10, well tolerated. CXR 07/22/11-    reviewed with them: Findings:  Grossly unchanged enlarged cardiac silhouette and mediastinal  contours post median sternotomy and CABG. Atherosclerotic  calcification within the thoracic aorta. There is persistent mild  elevation of the right hemidiaphragm. Grossly unchanged bibasilar  heterogeneous opacities favored to represent atelectasis. Grossly  unchanged bones including mild compression deformity of a lower  thoracic vertebral body.  IMPRESSION:  No acute cardiopulmonary disease.  Original Report Authenticated By: Waynard Reeds, M.D.  Office Spirometry: FEV1 1.40/58%, FVC 2.44/78%, FEV1/FEC oh 0.57/73%, FEF 25-75% 0.56/23%.  01/08/12- 50 year old man followed for severe chronic asthma, sleep apnea, allergic rhinitis, complicated by HBP, CAD, history of atrial fibrillation, GERD. Wife here Needs refill for rescue inhaler as he is leaving for beach in am; breathing is doing pretty well; only one treatment since seeing [place] last. . Asks for an antibiotic to carry.  05/10/2012 Acute OV Complains of increased dyspnea , coughing and wheezing. This woke him up this morning and used his neb without relief then used his inhaler without any relief.  Dry cough and wheezing started last pm.  Patient denies any hemoptysis, orthopnea, PND, leg swelling, or calf pain. Patient reports that wheezes. Started 2-3 days ago and worsened. Last night. Prior to going to bed. He had started on prednisone earlier in the week. at 10 mg and is currently on 5 mg daily .  He had his rescue inhaler this am with some relief.   Review of Systems-see HPI Constitutional:   No  weight loss, night sweats,  Fevers, chills, + fatigue, or  lassitude.  HEENT:   No headaches,  Difficulty swallowing,  Tooth/dental problems, or  Sore throat,                No sneezing, itching, ear ache,  +nasal congestion, post nasal drip,   CV:  No chest pain,  Orthopnea, PND, swelling in lower extremities, anasarca, dizziness,  palpitations, syncope.   GI  No heartburn, indigestion, abdominal pain, nausea, vomiting, diarrhea, change in bowel habits, loss of appetite, bloody stools.   Resp:   No coughing up of blood. No chest wall deformity  Skin: no rash or lesions.  GU: no dysuria, change in color of urine, no urgency or frequency.  No flank pain, no hematuria   MS:  No joint pain or swelling.  No decreased range of motion.  No back pain.  Psych:  No change in mood or affect. No depression or anxiety.  No memory loss.       Objective:   Physical Exam GEN: A/Ox3; pleasant , NAD elderly   HEENT:  [place]/AT,  EACs-clear, TMs-wnl, NOSE-clear, THROAT-clear, no lesions, no postnasal drip or exudate noted.   NECK:  Supple w/ fair ROM; no JVD; normal carotid impulses w/o bruits; no thyromegaly or nodules palpated; no lymphadenopathy.  RESP  Exp wheezes bilaterally, no accessory muscle use, no dullness to percussion, no stridor , talking in full sentences   CARD:  RRR, no m/r/g  , no peripheral edema, pulses intact, no cyanosis or clubbing.  GI:   Soft & nt; nml bowel sounds; no organomegaly or masses detected.  Musco: Warm bil, no deformities or joint swelling noted.   Neuro: alert, no focal deficits noted.    Skin: Warm, no lesions or rashes

## 2012-05-10 NOTE — Telephone Encounter (Signed)
Called, spoke with pt's spouse.  Reports pt is coughing a lot with a small amount of yellow mucus.  He was up all night due to coughing.  Also reports increased SOB at rest (pt not doing any activities), chest congestion, wheezing, and chills.  Symptoms started last night.  Pt is suing albuterol hfa with no relief and neb with no relief.  Requesting OV this am.    Spoke with Dr. Maple Hudson. Advised of pt's symptoms as there are no appts with [facility] today.  Per [facility], ok for pt to come in this am to see TP.    Spoke with pt spouse.  We have schedule pt to see TP today, Jan 31 at 12 pm.  Wife aware and was advised to have pt seek emergency care if needed prior to appt.  She verbalized understanding and voiced no further questions or concerns at this time.

## 2012-05-10 NOTE — Assessment & Plan Note (Addendum)
Exacerbation  xopenex neb given in office w/ improved aeration  Pt and wife aware if not improving will need to call back or go to ER.  Depo medrol 120mg  IM x 1  Case discussed with Dr. Maple Hudson    Plan  Prednisone taper. Over the next week Mucinex DM twice daily as needed. For cough and congestion. Depo-Medrol shot today in the office. Follow with Dr. Maple Hudson in 2 weeks and as needed  If patient does not improve or worsens we'll need to seek emergency care Please contact office for sooner follow up if symptoms do not improve or worsen or seek emergency care

## 2012-05-13 ENCOUNTER — Other Ambulatory Visit (HOSPITAL_COMMUNITY): Payer: Medicare Other

## 2012-05-22 ENCOUNTER — Ambulatory Visit (INDEPENDENT_AMBULATORY_CARE_PROVIDER_SITE_OTHER): Payer: Medicare Other

## 2012-05-22 DIAGNOSIS — J309 Allergic rhinitis, unspecified: Secondary | ICD-10-CM

## 2012-05-30 ENCOUNTER — Encounter: Payer: Self-pay | Admitting: Internal Medicine

## 2012-05-30 ENCOUNTER — Ambulatory Visit (INDEPENDENT_AMBULATORY_CARE_PROVIDER_SITE_OTHER): Payer: Medicare Other | Admitting: Internal Medicine

## 2012-05-30 ENCOUNTER — Ambulatory Visit: Payer: Self-pay | Admitting: Internal Medicine

## 2012-05-30 VITALS — BP 136/80 | HR 66 | Ht 62.0 in | Wt 162.2 lb

## 2012-05-30 NOTE — Assessment & Plan Note (Addendum)
Recurrent bronchitic episodes. Okay to use Mucinex DM whenever needed. Plan-bone density test because of frequent steroid therapy. Trial of Daliresp was carefully discussed          Considered will update PFT do fine how much of this is reversible and how much is fixed obstructive airways disease/COPD

## 2012-05-30 NOTE — Assessment & Plan Note (Signed)
Good compliance and control. No intervention needed.

## 2012-05-30 NOTE — Progress Notes (Signed)
Patient ID: [name], male    DOB: 1930-07-08, 79 y.o.   MRN: [id number]  HPI 11/16/10- 47 year old man followed for severe chronic asthma, sleep apnea, allergic rhinitis, complicated by HBP CAD history of atrial fibrillation, GERD Last here May 20, 2010 CPAP 10 all night every night- helps sleep. Asks refill Provigil to help with long drives.  Allergy vaccine GO definitely helps- he is convinced.  He notes no asthma and no prednisone since last November- credits frequent handwashing, avoiding people with colds and the allergy vaccine. No new concerns. Continues cardiac rehab.   03/03/11-  37 year old man followed for severe chronic asthma, sleep apnea, allergic rhinitis, complicated by HBP CAD history of atrial fibrillation, GERD. Wife here. He recently had his fourth hospital stay since March, for gallbladder pain, cholecystectomy, cardiac stent. Cholecystectomy was 2 weeks ago. Through these he had shortness of breath and cough which has persisted and keep him awake. His nose runs.  07/03/11- 33 year old man followed for severe chronic asthma, sleep apnea, allergic rhinitis, complicated by HBP, CAD, history of atrial fibrillation, GERD. Wife here. Since last here has been to urgent care in emergency room 4 times for exacerbations of asthma. On prednisone 10 mg/day since February from Dr [name]. Definitely having reflux events and we discussed how that may be the significant issue. Denies sinus infection. Had chest x-rays in January and February which she was told were negative. He asks to try increasing his CPAP pressure because he doesn't feel he is sleeping quite well enough. We discussed this.  07/18/11- 11 year old man followed for severe chronic asthma, sleep apnea, allergic rhinitis, complicated by HBP, CAD, history of atrial fibrillation, GERD. Wife here. Seen by Dr Daub-Increased SOB and wheezing; was put on prednisone to help keep patient out of hospital; still not feeling  much better. Has been on prednisone 60 mg daily for past 4 days but still significant shortness of breath with exertion and wheeze. No excess 2 chest x-rays have been clear and EKG "knot heart". Using home nebulizer with DuoNeb 4 times daily plus his rescue inhaler at least twice per day. Nothing seems infected. He has not recognized heartburn although that has been an issue in the past. Denies chest pain, fever, ankle edema or palpitation. Some nasal congestion without much sneezing. Has been indoors without much exposure to pollen.  08/08/11- 44 year old man followed for severe chronic asthma, sleep apnea, allergic rhinitis, complicated by HBP, CAD, history of atrial fibrillation, GERD. Wife here. Hospitalized April 11-17 with acute exacerbation of his chronic fixed asthma. Anxiety and reflux are thought to be important. He says he is "doing great" now. He feels an anxiety medication would help and we discussed this. Currently on prednisone 20 mg daily.  10/04/11-  22 year old man followed for severe chronic asthma, sleep apnea, allergic rhinitis, complicated by HBP, CAD, history of atrial fibrillation, GERD. Wife here  Pt states increase of sob,wheezing,fatigue since getting out of hospital . No hospitalizations since last here. He stays short of breath especially with exertional dyspnea but is comfortable sitting and when lying in bed. His wife points out he is able to do yard work using a [employer]. We reviewed Dr.Hochrein's cardiology note. Question, , each of his dyspnea is pulmonary versus cardiac. We were asked to draw labs at this visit. He was criticized for using his nebulizer machine too frequently before his hospitalization but has only used it a total of 7 times since then. He had remained on maintenance prednisone  since January. His wife gradually tapered it off so he has had none in the last 3 weeks. He continues, and wants to continue, allergy vaccine at 1:10, well tolerated. CXR 07/22/11-    reviewed with them: Findings:  Grossly unchanged enlarged cardiac silhouette and mediastinal  contours post median sternotomy and CABG. Atherosclerotic  calcification within the thoracic aorta. There is persistent mild  elevation of the right hemidiaphragm. Grossly unchanged bibasilar  heterogeneous opacities favored to represent atelectasis. Grossly  unchanged bones including mild compression deformity of a lower  thoracic vertebral body.  IMPRESSION:  No acute cardiopulmonary disease.  Original Report Authenticated By: Waynard Reeds, M.D.  Office Spirometry: FEV1 1.40/58%, FVC 2.44/78%, FEV1/FEC oh 0.57/73%, FEF 25-75% 0.56/23%.  01/08/12- 4 year old man followed for severe chronic asthma, sleep apnea, allergic rhinitis, complicated by HBP, CAD, history of atrial fibrillation, GERD. Wife here Needs refill for rescue inhaler as he is leaving for beach in am; breathing is doing pretty well; only one treatment since seeing [place] last. . Asks for an antibiotic to carry.  05/10/2012 Acute OV Complains of increased dyspnea , coughing and wheezing. This woke him up this morning and used his neb without relief then used his inhaler without any relief.  Dry cough and wheezing started last pm.  Patient denies any hemoptysis, orthopnea, PND, leg swelling, or calf pain. Patient reports that wheezes. Started 2-3 days ago and worsened. Last night. Prior to going to bed. He had started on prednisone earlier in the week. at 10 mg and is currently on 5 mg daily .  He had his rescue inhaler this am with some relief.  05/30/12- 60 year old man followed for severe chronic asthma, sleep apnea, allergic rhinitis, complicated by HBP, CAD, history of atrial fibrillation, GERD. Wife here He says he is a well now, off prednisone x2 weeks. Likes Mucinex DM. We discussed steroidsand I suggested bone density assessment. We discussed steroid sparing availability of Daliresp trial is frequency of bronchitic  exacerbations.  He continues using CPAP 12/ [employer] with no concerns.   ROS-see HPI Constitutional:   No-   weight loss, night sweats, fevers, chills, fatigue, lassitude. HEENT:   No-  headaches, difficulty swallowing, tooth/dental problems, sore throat,       No-  sneezing, itching, ear ache, nasal congestion, post nasal drip,  CV:  No-   chest pain, orthopnea, PND, swelling in lower extremities, anasarca,                                  dizziness, palpitations Resp: No-   shortness of breath with exertion or at rest.              No-   productive cough,  No non-productive cough,  No- coughing up of blood.              No-   change in color of mucus.  No- wheezing.   Skin: No-   rash or lesions. GI:  No-   heartburn, indigestion, abdominal pain, nausea, vomiting,  GU:  MS:  No-   joint pain or swelling.   Neuro-     nothing unusual Psych:  No- change in mood or affect. No depression or anxiety.  No memory loss.  Objective:  OBJ- Physical Exam General- Alert, Oriented, Affect-appropriate, Distress- none acute Skin- rash-none, lesions- none, excoriation- none Lymphadenopathy- none Head- atraumatic  Eyes- Gross vision intact, PERRLA, conjunctivae and secretions clear            Ears- Hearing, canals-normal            Nose- Clear, no-Septal dev, mucus, polyps, erosion, perforation             Throat- Mallampati III , mucosa clear , drainage- none, tonsils- atrophic Neck- flexible , trachea midline, no stridor , thyroid nl, carotid no bruit Chest - symmetrical excursion , unlabored           Heart/CV- RRR , no murmur , no gallop  , no rub, nl s1 s2                           - JVD- none , edema- none, stasis changes- none, varices- none           Lung- unusually clear to P&A today, wheeze- none, cough- none , dullness-none, rub- none           Chest wall-  Abd-  Br/ Gen/ Rectal- Not done, not indicated Extrem- cyanosis- none, clubbing, none, atrophy- none, strength- nl Neuro-  grossly intact to observation

## 2012-05-30 NOTE — Patient Instructions (Addendum)
Daliresp 500mg  1/2 tablet by mouth every other day WITH FOOD x2 weeks If tolerating this dose, then increase to 1 whole tablet by mouth once daily WITH FOOD Okay for Mucinex DM as needed Bone Density  Please call as needed

## 2012-06-07 ENCOUNTER — Ambulatory Visit (INDEPENDENT_AMBULATORY_CARE_PROVIDER_SITE_OTHER)
Admission: RE | Admit: 2012-06-07 | Discharge: 2012-06-07 | Disposition: A | Discharge status: Home / Self Care | Payer: Medicare Other | Source: Ambulatory Visit | Attending: Internal Medicine | Admitting: Internal Medicine

## 2012-06-07 DIAGNOSIS — J449 Chronic obstructive pulmonary disease, unspecified: Secondary | ICD-10-CM

## 2012-06-08 ENCOUNTER — Other Ambulatory Visit: Payer: Self-pay | Admitting: Cardiology

## 2012-06-11 ENCOUNTER — Telehealth: Payer: Self-pay | Admitting: *Deleted

## 2012-06-11 MED ORDER — LOSARTAN POTASSIUM 50 MG PO TABS: 50.0000 mg | ORAL_TABLET | Freq: Two times a day (BID) | ORAL | Status: DC

## 2012-06-11 MED ORDER — BISOPROLOL FUMARATE 10 MG PO TABS: 10.0000 mg | ORAL_TABLET | Freq: Every day | ORAL | Status: DC

## 2012-06-11 NOTE — Telephone Encounter (Signed)
Patient's wife calling to request refills for Losartan, Bisoprolol, Potassium, Pravastatin.  Informed wife that prav & klor con were sent 06/08/12 but i will send in [place] and Biso to [facility].   Losartan 50mg , twice daily by mouth #60, 5 refills Bisoprolol 10mg  once daily by mouth #30, 5 refills  Micki Riley, CMA

## 2012-06-12 ENCOUNTER — Encounter: Payer: Self-pay | Admitting: Internal Medicine

## 2012-06-12 ENCOUNTER — Ambulatory Visit (INDEPENDENT_AMBULATORY_CARE_PROVIDER_SITE_OTHER): Payer: Medicare Other | Admitting: Internal Medicine

## 2012-06-12 VITALS — BP 130/92 | HR 77 | Temp 97.6°F | Wt 159.0 lb

## 2012-06-12 DIAGNOSIS — M810 Age-related osteoporosis without current pathological fracture: Secondary | ICD-10-CM | POA: Insufficient documentation

## 2012-06-12 MED ORDER — DENOSUMAB 60 MG/ML ~~LOC~~ SOLN: 60.0000 mg | SUBCUTANEOUS | Status: DC

## 2012-06-12 MED ORDER — CALCIUM CARBONATE-VITAMIN D 500-200 MG-UNIT PO TABS: 1.0000 | ORAL_TABLET | Freq: Two times a day (BID) | ORAL | Status: DC

## 2012-06-12 NOTE — Assessment & Plan Note (Signed)
Temporarily stopped amlodipine early 10/2011 due to mild dependant edema (now resolved) started ARB 10/2011 and titrated to max Resumed low dose amlodipine late 10/2011, then titrated to max dose amlodipine 11/2011 with continue daily diuretics  Improved The current medical regimen is effective;  continue present plan and medications.  BP Readings from Last 3 Encounters:  06/12/12 130/92  05/30/12 136/80  05/10/12 124/82

## 2012-06-12 NOTE — Assessment & Plan Note (Signed)
No hx fracture, freq pred use due to COPD Discussed med options for tx - given hx esophageal stricture, recommend Prolia injections -  Will start same after insurance approval Continue WB exercise activity and take Ca+D BID Education on same provided at length today

## 2012-06-12 NOTE — Progress Notes (Signed)
Subjective:    Patient ID: [name]., male    DOB: 1930/12/22, 79 y.o.   MRN: [id number]  HPI  Here for follow up -  Reports dx osteoporosis by DEXA last month - ordered by pulm due to freq pred use - no hx fracture, no falls, not smoking at this time - active at gym with WB activity, no Ca/D supplements  Also reviewed chronic medical issues today:  Severe COPD, chronic asthma, remote tobacco - follows with pulm for same - frequent flares related to weather change.  reports compliance with ongoing nebulizer treatment (steroid and Alb) but unable to afford Spiriva or Daliresp. Changed to [facility] spring 2013 -does not feel improved but denies adverse side effects related to current therapy. Recent exacerbations reviewed  CAD s/p CABG 2008, DES to saph v graft 06/2010 due to abn stress test - follows with cards for same - reports compliance with ongoing medical treatment and no changes in medication dose or frequency. denies adverse side effects related to current therapy. no chest pain or anginal symptoms   HTN -exacerbation summer 2013 with med changes reviewed- reports compliance with ongoing medical treatment. denies adverse side effects related to current therapy. no headache or weakness -  dyslipidemia - changed from simva 80mg  to prava 6/2011due to FDA warnings - reports compliance with ongoing medical treatment and no other changes in medication dose or frequency. denies adverse side effects related to current therapy. no muscle pain or weakness  Also episode of biliary pancreatitis November 2012, followed by laparoscopic cholecystectomy 02/21/11 -   Past Medical History  Diagnosis Date  . CORONARY HEART DISEASE     a. s/p CABG;  b.  cath 06/27/10: S-Dx occluded, S-PDA 80-90% (tx with PCI); S-OM ok, L-LAD ok;  EF 65-70%  c.  s/p Promus DES to S-PDA 06/2010;   d. Myoview 8/12: low risk  . FIBRILLATION, ATRIAL     post op; ?documented during hosp. 06/2010  . SLEEP APNEA 09/2001   NPSG AHI 22/HR  . ALLERGIC RHINITIS   . ASTHMA   . Depression   . GERD     with HH, hx esophageal stricture  . DYSLIPIDEMIA   . HYPERTENSION     Echo 3/12: EF 55-60%; mod LVH; mild AS/AI; LAE; PASP 38; mild pulmo HTN  . Personal history of alcoholism   . Diverticulosis   . Benign liver cyst   . Skin cancer     L forearm  . Cataract     surgery to both eyes  . Esophageal stricture   . Hiatal hernia     Review of Systems  Constitutional: Positive for fatigue. Negative for fever and unexpected weight change.  Respiratory: Positive for cough (dry) and shortness of breath (chronic). Negative for wheezing.   Cardiovascular: Negative for chest pain and leg swelling.  Musculoskeletal: Negative for myalgias and arthralgias.  Neurological: Negative for dizziness, weakness, light-headedness and headaches.      Objective:   Physical Exam  BP 130/92  Pulse 77  Temp(Src) 97.6 F (36.4 C) (Oral)  Wt 159 lb (72.122 kg)  BMI 29.07 kg/m2  SpO2 96% Wt Readings from Last 3 Encounters:  06/12/12 159 lb (72.122 kg)  05/30/12 162 lb 3.2 oz (73.573 kg)  05/10/12 162 lb (73.483 kg)   Constitutional:  He is short and overweight, appears well-developed and well-nourished. No distress. Wife at side Cardiovascular: Normal rate, regular rhythm and normal heart sounds.  No murmur heard. no BLE edema  Pulmonary/Chest: resting mild dyspnea, worse with conversation - but at baseline. No respiratory distress. Mild inspiratory and expiratory wheeze but fair air movement B.  Skin: Solar and senile changes, chronic hemo chromatic and tattooed forearms. Skin is warm and dry.  No erythema or ulceration.  Psychiatric: he has a normal mood and affect. behavior is normal. Judgment and thought content normal.      Lab Results  Component Value Date   WBC 8.2 04/26/2012   HGB 12.1* 04/26/2012   HCT 35.8* 04/26/2012   PLT 228.0 04/26/2012   CHOL 119 10/04/2011   TRIG 114.0 10/04/2011   HDL 34.50* 10/04/2011    ALT 18 02/14/2012   AST 15 02/14/2012   NA 142 02/14/2012   K 4.0 02/14/2012   CL 105 02/14/2012   CREATININE 0.9 02/14/2012   BUN 22 02/14/2012   CO2 29 02/14/2012   TSH 1.08 02/14/2012   PSA 0.62 10/10/2010   INR 1.0 04/26/2012   HGBA1C 6.1 02/14/2012    Assessment & Plan:   See problem list. Medications and labs reviewed today.

## 2012-06-12 NOTE — Patient Instructions (Signed)
It was good to see you today. We have reviewed your prior records including labs and tests today Will start Prolia injections every 6 months for management of your osteoporosis (bone loss) - my office will call you once this is approved to arrange for your 1st injection Start OsCal 2x/day for calcium and Vit D intake Remain active with "weight bearing" exercise to keep bones strong as we discussedOsteoporosis Throughout your life, your body breaks down old bone and replaces it with new bone. As you get older, your body does not replace bone as quickly as it breaks it down. By the age of 30 years, most people begin to gradually lose bone because of the imbalance between bone loss and replacement. Some people lose more bone than others. Bone loss beyond a specified normal degree is considered osteoporosis.  Osteoporosis affects the strength and durability of your bones. The inside of the ends of your bones and your flat bones, like the bones of your pelvis, look like honeycomb, filled with tiny open spaces. As bone loss occurs, your bones become less dense. This means that the open spaces inside your bones become bigger and the walls between these spaces become thinner. This makes your bones weaker. Bones of a person with osteoporosis can become so weak that they can break (fracture) during minor accidents, such as a simple fall. CAUSES  The following factors have been associated with the development of osteoporosis:  Smoking.  Drinking more than 2 alcoholic drinks several days per week.  Long-term use of certain medicines:  Corticosteroids.  Chemotherapy medicines.  Thyroid medicines.  Antiepileptic medicines.  Gonadal hormone suppression medicine.  Immunosuppression medicine.  Being underweight.  Lack of physical activity.  Lack of exposure to the sun. This can lead to vitamin D deficiency.  Certain medical conditions:  Certain inflammatory bowel diseases, such as Crohn's disease  and ulcerative colitis.  Diabetes.  Hyperthyroidism.  Hyperparathyroidism. RISK FACTORS Anyone can develop osteoporosis. However, the following factors can increase your risk of developing osteoporosis:  Gender Women are at higher risk than men.  Age Being older than 50 years increases your risk.  Ethnicity White and Asian people have an increased risk.  Weight Being extremely underweight can increase your risk of osteoporosis.  Family history of osteoporosis Having a family member who has developed osteoporosis can increase your risk. SYMPTOMS  Usually, people with osteoporosis have no symptoms.  DIAGNOSIS  Signs during a physical exam that may prompt your caregiver to suspect osteoporosis include:  Decreased height. This is usually caused by the compression of the bones that form your spine (vertebrae) because they have weakened and become fractured.  A curving or rounding of the upper back (kyphosis). To confirm signs of osteoporosis, your caregiver may request a procedure that uses 2 low-dose X-ray beams with different levels of energy to measure your bone mineral density (dual-energy X-ray absorptiometry [DXA]). Also, your caregiver may check your level of vitamin D. TREATMENT  The goal of osteoporosis treatment is to strengthen bones in order to decrease the risk of bone fractures. There are different types of medicines available to help achieve this goal. Some of these medicines work by slowing the processes of bone loss. Some medicines work by increasing bone density. Treatment also involves making sure that your levels of calcium and vitamin D are adequate. PREVENTION  There are things you can do to help prevent osteoporosis. Adequate intake of calcium and vitamin D can help you achieve optimal bone mineral density. Regular  exercise can also help, especially resistance and high-impact activities. If you smoke, quitting smoking is an important part of osteoporosis  prevention. MAKE SURE YOU:  Understand these instructions.  Will watch your condition.  Will get help right away if you are not doing well or get worse. Document Released: 01/04/2005 Document Revised: 06/19/2011 Document Reviewed: 03/11/2011 [facility] Patient Information 2013 [place], [place].

## 2012-06-12 NOTE — Assessment & Plan Note (Signed)
Mild flares with change in weather - but currently stable symptoms Uses chronic meds with pred taper prn, also follows with pulm The current medical regimen is effective;  continue present plan and medications.

## 2012-06-26 ENCOUNTER — Telehealth: Payer: Self-pay | Admitting: Internal Medicine

## 2012-06-26 NOTE — Telephone Encounter (Signed)
No equivalent cheaper. By "1/2 RX" are they asking for #15/ month?

## 2012-06-26 NOTE — Telephone Encounter (Signed)
Spoke with pts wife. They are requesting an 1/2 rx for dilresp. Also would like to know if this comes in generic? Or is there something Cheaper he could take.  Allergies  Allergen Reactions  . Codeine Nausea And Vomiting   Dr Maple Hudson Please advise  Thank you

## 2012-06-27 NOTE — Telephone Encounter (Signed)
Yes correction is for a supply of #15/ for the month.

## 2012-06-28 MED ORDER — ROFLUMILAST 500 MCG PO TABS: ORAL_TABLET | ORAL | Status: DC

## 2012-06-28 NOTE — Telephone Encounter (Signed)
Ok to send script for Daliresp, 500 mg, # 15, 1/2- 1 daily with food    Ref prn

## 2012-06-28 NOTE — Telephone Encounter (Signed)
Called and spoke with pts wife and she is aware that the daliresp has been sent to the pharmacy for the refill.  Nothing further is needed.

## 2012-06-29 ENCOUNTER — Other Ambulatory Visit: Payer: Self-pay | Admitting: Internal Medicine

## 2012-07-03 ENCOUNTER — Telehealth: Payer: Self-pay | Admitting: Internal Medicine

## 2012-07-03 NOTE — Telephone Encounter (Signed)
Received a PA request from [employer] on [place] for daliresp. I initiated PA through [email or web address]. Insurance info is [employer] # [id number], pt ID# [id number]. I will forward message to [facility] to follow-up on approval/denial. Carron Curie, CMA

## 2012-07-10 ENCOUNTER — Other Ambulatory Visit: Payer: Self-pay | Admitting: Dermatology

## 2012-07-11 NOTE — Telephone Encounter (Signed)
CY---PA denied for Daliresp-please see attached information to phone note on your cart. Thanks.

## 2012-08-13 ENCOUNTER — Telehealth: Payer: Self-pay | Admitting: Internal Medicine

## 2012-08-13 MED ORDER — IPRATROPIUM-ALBUTEROL 0.5-2.5 (3) MG/3ML IN SOLN: 3.0000 mL | Freq: Four times a day (QID) | RESPIRATORY_TRACT | Status: DC | PRN

## 2012-08-13 NOTE — Telephone Encounter (Signed)
Rx faxed to [facility] at [phone]. Nothing more needed; will sign off on message

## 2012-08-13 NOTE — Telephone Encounter (Signed)
I spoke with spouse. Pt needs his duoneb sent to [facility]. I advised her will have rx printed out and fax over to them she voiced her understanding. Nothing further was needed RX was printed.

## 2012-08-16 ENCOUNTER — Telehealth: Payer: Self-pay | Admitting: Pulmonary Disease

## 2012-08-16 NOTE — Telephone Encounter (Signed)
Called spoke with pt's spouse, [name] Pt is needing his DUONEB, not DALIRESP Per pt's chart, Katie faxed the rx on 5.6.14  Called [facility]'s after-hours line > was connected to the pharmacy dept Per Nicole Cella w/ the pharmacy dept, the shipping dept in our region [facility]) closes at 5pm Pt's spouse called at [employer] spoke with [name], pt does have enough medication to last until Monday/Tuesday They are leaving for [facility] on Sunday, will be riding with family so unable to postpone travel Per [name], this has happened in the past and [place] Mo to call back on Monday and we would work on this for them [name] okay with this and verbalized her understanding Will hold in triage

## 2012-08-19 NOTE — Telephone Encounter (Signed)
LMOM TCB x1 at cell # since pt is out of town

## 2012-08-20 NOTE — Telephone Encounter (Signed)
LMOM TCB x2 for pt spouse [name]

## 2012-08-21 NOTE — Telephone Encounter (Signed)
I called # provided above - [email or web address] I called pt's home # - [email or web address] I called pt's cell # at [phone], spoke with pt's wife.  Wife states she wants to make sure [name] has received the Duoneb rx.  Wife is requesting to call [facility] to see if they have the rx, and if they do, she will have them ship the medication to the address they are at.  She will call us back either way so we know if anything further is needed from our end.  Will await call back.

## 2012-08-23 ENCOUNTER — Telehealth: Payer: Self-pay | Admitting: Internal Medicine

## 2012-08-23 MED ORDER — IPRATROPIUM-ALBUTEROL 0.5-2.5 (3) MG/3ML IN SOLN: 3.0000 mL | Freq: Four times a day (QID) | RESPIRATORY_TRACT | Status: DC | PRN

## 2012-08-23 NOTE — Telephone Encounter (Signed)
I spoke to pt's wife. She states that they have been going back and forth about needing a refill on duoneb. From the looks of it, this was printed on 08/13/2012 with 5 additional refills. Wife is aware that I will take care of this and get this prescription to [facility]. Nothing further was needed.

## 2012-08-26 ENCOUNTER — Ambulatory Visit: Payer: Medicare Other

## 2012-08-26 ENCOUNTER — Ambulatory Visit (INDEPENDENT_AMBULATORY_CARE_PROVIDER_SITE_OTHER): Payer: Medicare Other | Admitting: Family Medicine

## 2012-08-26 ENCOUNTER — Encounter: Payer: Self-pay | Admitting: Family Medicine

## 2012-08-26 VITALS — BP 132/60 | HR 79 | Temp 97.6°F | Resp 30 | Ht 62.0 in | Wt 161.0 lb

## 2012-08-26 DIAGNOSIS — I517 Cardiomegaly: Secondary | ICD-10-CM

## 2012-08-26 DIAGNOSIS — R0602 Shortness of breath: Secondary | ICD-10-CM

## 2012-08-26 DIAGNOSIS — R0989 Other specified symptoms and signs involving the circulatory and respiratory systems: Secondary | ICD-10-CM

## 2012-08-26 DIAGNOSIS — J45901 Unspecified asthma with (acute) exacerbation: Secondary | ICD-10-CM

## 2012-08-26 MED ORDER — ALBUTEROL SULFATE (2.5 MG/3ML) 0.083% IN NEBU
2.5000 mg | INHALATION_SOLUTION | Freq: Once | RESPIRATORY_TRACT | Status: AC
  Administered 2012-08-26: 2.5 mg via RESPIRATORY_TRACT

## 2012-08-26 MED ORDER — PREDNISONE 20 MG PO TABS: ORAL_TABLET | ORAL | Status: DC

## 2012-08-26 MED ORDER — METHYLPREDNISOLONE SODIUM SUCC 125 MG IJ SOLR
125.0000 mg | Freq: Once | INTRAMUSCULAR | Status: AC
  Administered 2012-08-26: 125 mg via INTRAMUSCULAR

## 2012-08-26 MED ORDER — IPRATROPIUM BROMIDE 0.02 % IN SOLN
0.5000 mg | Freq: Once | RESPIRATORY_TRACT | Status: AC
  Administered 2012-08-26: 0.5 mg via RESPIRATORY_TRACT

## 2012-08-26 MED ORDER — PREDNISONE 20 MG PO TABS: 20.0000 mg | ORAL_TABLET | Freq: Every day | ORAL | Status: DC

## 2012-08-26 NOTE — Patient Instructions (Addendum)
Use the nebulizer every 4-6 hours when awake  Take the prednisone 3 tablets daily for 2 days, then 2 tablets daily for 2 days, then one tablet daily for 2 days, then one half daily until gone  Drink plenty of fluids there  Ask your lung specialist about using a medicine like Spiriva. Also asked him if there is some reason that the albuterol and itraponium (Atrovent) combined unit dose bowels here in the office seemed to do better than the DuoNeb which is pre-mixed that you are using at home.

## 2012-08-26 NOTE — Progress Notes (Signed)
Subjective: Patient with a history of asthma and coronary artery disease. He has had previous CABG. He was at the beach this weekend and got bad with his breathing. He is on a number of medications, and sees Dr. Maple Hudson, pulmonologist, for this. However when he gets acute he comes in for treatment. He finds that the nebulized treatment here at the office of more than his last at home. He has been using the duo neb At home.Marland Kitchen He does not smoke. He was at the beach 3 days ago difficulty breathing. He came back in today is quite red.  Is not coughing up any phlegm, just some clear stuff.  Objective His TMs are normal. Throat clear. Neck supple without nodes. No carotid bruits. Chest very poor air movement and minimal wheezes. Peak flow did not even touch the meter initially, but after the treatment he got to 320. He still had some wheezes at the treatment.  [facility] reading (PRIMARY) by  Dr. Alwyn Ren Cardiomegaly. Lungs are clear.  CABG wires..  Assessment: Asthma with exacerbation, severe History of coronary artery disease and CABG  Plan: Responded well to the neb treatment. Gave him 125 of Solu-Medrol IM. Symptoms home on oral prednisone. Continue using his nebulized treatments at home.

## 2012-08-28 ENCOUNTER — Other Ambulatory Visit: Payer: Self-pay | Admitting: Dermatology

## 2012-09-04 ENCOUNTER — Telehealth: Payer: Self-pay | Admitting: Internal Medicine

## 2012-09-04 NOTE — Telephone Encounter (Signed)
Pt's spouse called req an excuse letter for jury duty due to his medical conditions. Pt spouse stated that he can not hear or understand others very good. Please advise. The date for Jury Duty is 09/26/12.

## 2012-09-04 NOTE — Telephone Encounter (Signed)
Ok to generate letter stating same - please ask Rene Kocher to sign on my behalf - thanks

## 2012-09-05 ENCOUNTER — Encounter: Payer: Self-pay | Admitting: Internal Medicine

## 2012-09-05 ENCOUNTER — Ambulatory Visit (INDEPENDENT_AMBULATORY_CARE_PROVIDER_SITE_OTHER): Payer: Medicare Other | Admitting: Internal Medicine

## 2012-09-05 VITALS — BP 160/70 | HR 71 | Ht 61.75 in | Wt 160.4 lb

## 2012-09-05 DIAGNOSIS — J309 Allergic rhinitis, unspecified: Secondary | ICD-10-CM

## 2012-09-05 DIAGNOSIS — J45909 Unspecified asthma, uncomplicated: Secondary | ICD-10-CM

## 2012-09-05 DIAGNOSIS — G4733 Obstructive sleep apnea (adult) (pediatric): Secondary | ICD-10-CM

## 2012-09-05 DIAGNOSIS — J45998 Other asthma: Secondary | ICD-10-CM

## 2012-09-05 NOTE — Progress Notes (Signed)
Patient ID: [name], male    DOB: 1930-07-08, 79 y.o.   MRN: [id number]  HPI 11/16/10- 47 year old man followed for severe chronic asthma, sleep apnea, allergic rhinitis, complicated by HBP CAD history of atrial fibrillation, GERD Last here May 20, 2010 CPAP 10 all night every night- helps sleep. Asks refill Provigil to help with long drives.  Allergy vaccine GO definitely helps- he is convinced.  He notes no asthma and no prednisone since last November- credits frequent handwashing, avoiding people with colds and the allergy vaccine. No new concerns. Continues cardiac rehab.   03/03/11-  37 year old man followed for severe chronic asthma, sleep apnea, allergic rhinitis, complicated by HBP CAD history of atrial fibrillation, GERD. Wife here. He recently had his fourth hospital stay since March, for gallbladder pain, cholecystectomy, cardiac stent. Cholecystectomy was 2 weeks ago. Through these he had shortness of breath and cough which has persisted and keep him awake. His nose runs.  07/03/11- 33 year old man followed for severe chronic asthma, sleep apnea, allergic rhinitis, complicated by HBP, CAD, history of atrial fibrillation, GERD. Wife here. Since last here has been to urgent care in emergency room 4 times for exacerbations of asthma. On prednisone 10 mg/day since February from Dr [name]. Definitely having reflux events and we discussed how that may be the significant issue. Denies sinus infection. Had chest x-rays in January and February which she was told were negative. He asks to try increasing his CPAP pressure because he doesn't feel he is sleeping quite well enough. We discussed this.  07/18/11- 11 year old man followed for severe chronic asthma, sleep apnea, allergic rhinitis, complicated by HBP, CAD, history of atrial fibrillation, GERD. Wife here. Seen by Dr Daub-Increased SOB and wheezing; was put on prednisone to help keep patient out of hospital; still not feeling  much better. Has been on prednisone 60 mg daily for past 4 days but still significant shortness of breath with exertion and wheeze. No excess 2 chest x-rays have been clear and EKG "knot heart". Using home nebulizer with DuoNeb 4 times daily plus his rescue inhaler at least twice per day. Nothing seems infected. He has not recognized heartburn although that has been an issue in the past. Denies chest pain, fever, ankle edema or palpitation. Some nasal congestion without much sneezing. Has been indoors without much exposure to pollen.  08/08/11- 44 year old man followed for severe chronic asthma, sleep apnea, allergic rhinitis, complicated by HBP, CAD, history of atrial fibrillation, GERD. Wife here. Hospitalized April 11-17 with acute exacerbation of his chronic fixed asthma. Anxiety and reflux are thought to be important. He says he is "doing great" now. He feels an anxiety medication would help and we discussed this. Currently on prednisone 20 mg daily.  10/04/11-  22 year old man followed for severe chronic asthma, sleep apnea, allergic rhinitis, complicated by HBP, CAD, history of atrial fibrillation, GERD. Wife here  Pt states increase of sob,wheezing,fatigue since getting out of hospital . No hospitalizations since last here. He stays short of breath especially with exertional dyspnea but is comfortable sitting and when lying in bed. His wife points out he is able to do yard work using a [employer]. We reviewed Dr.Hochrein's cardiology note. Question, , each of his dyspnea is pulmonary versus cardiac. We were asked to draw labs at this visit. He was criticized for using his nebulizer machine too frequently before his hospitalization but has only used it a total of 7 times since then. He had remained on maintenance prednisone  since January. His wife gradually tapered it off so he has had none in the last 3 weeks. He continues, and wants to continue, allergy vaccine at 1:10, well tolerated. CXR 07/22/11-    reviewed with them: Findings:  Grossly unchanged enlarged cardiac silhouette and mediastinal  contours post median sternotomy and CABG. Atherosclerotic  calcification within the thoracic aorta. There is persistent mild  elevation of the right hemidiaphragm. Grossly unchanged bibasilar  heterogeneous opacities favored to represent atelectasis. Grossly  unchanged bones including mild compression deformity of a lower  thoracic vertebral body.  IMPRESSION:  No acute cardiopulmonary disease.  Original Report Authenticated By: Waynard Reeds, M.D.  Office Spirometry: FEV1 1.40/58%, FVC 2.44/78%, FEV1/FEC oh 0.57/73%, FEF 25-75% 0.56/23%.  01/08/12- 19 year old man followed for severe chronic asthma, sleep apnea, allergic rhinitis, complicated by HBP, CAD, history of atrial fibrillation, GERD. Wife here Needs refill for rescue inhaler as he is leaving for beach in am; breathing is doing pretty well; only one treatment since seeing [place] last. . Asks for an antibiotic to carry.  05/10/2012 Acute OV Complains of increased dyspnea , coughing and wheezing. This woke him up this morning and used his neb without relief then used his inhaler without any relief.  Dry cough and wheezing started last pm.  Patient denies any hemoptysis, orthopnea, PND, leg swelling, or calf pain. Patient reports that wheezes. Started 2-3 days ago and worsened. Last night. Prior to going to bed. He had started on prednisone earlier in the week. at 10 mg and is currently on 5 mg daily .  He had his rescue inhaler this am with some relief.  05/30/12- 58 year old man followed for severe chronic asthma, sleep apnea, allergic rhinitis, complicated by HBP, CAD, history of atrial fibrillation, GERD. Wife here He says he is a well now, off prednisone x2 weeks. Likes Mucinex DM. We discussed steroidsand I suggested bone density assessment. We discussed steroid sparing availability of Daliresp trial is frequency of bronchitic  exacerbations.  He continues using CPAP 12/ [facility] with no concerns.  09/05/12- 80 year old man followed for severe chronic asthma, sleep apnea, allergic rhinitis, complicated by HBP, CAD, history of atrial fibrillation, GERD.     Wife here FOLLOWS FOR: had to go to UC on 08-26-12 due to breathing issues. Was given shot, neb tx, and pred taper. Had caught a cold while fishing, leading to exacerbation. Finish prednisone taper 2 days ago. Feels "great" now. Allergy vaccine 1:10 GO CPAP 12/[facility] with good compliance and control CXR 08/26/12 IMPRESSION:  No active disease.  Original Report Authenticated By: Sander Radon, M.D.     ROS-see HPI Constitutional:   No-   weight loss, night sweats, fevers, chills, fatigue, lassitude. HEENT:   No-  headaches, difficulty swallowing, tooth/dental problems, sore throat,       No-  sneezing, itching, ear ache, nasal congestion, post nasal drip,  CV:  No-   chest pain, orthopnea, PND, swelling in lower extremities, anasarca,                                  dizziness, palpitations Resp: No-   shortness of breath with exertion or at rest.              No-   productive cough,  No non-productive cough,  No- coughing up of blood.              No-   change  in color of mucus.  No- wheezing.   Skin: No-   rash or lesions. GI:  No-   heartburn, indigestion, abdominal pain, nausea, vomiting,  GU:  MS:  No-   joint pain or swelling.   Neuro-     nothing unusual Psych:  No- change in mood or affect. No depression or anxiety.  No memory loss.  Objective:  OBJ- Physical Exam General- Alert, Oriented, Affect-appropriate, Distress- none acute Skin- rash-none, lesions- none, excoriation- none Lymphadenopathy- none Head- atraumatic            Eyes- Gross vision intact, PERRLA, conjunctivae and secretions clear            Ears- Hearing, canals-normal            Nose- Clear, no-Septal dev, mucus, polyps, erosion, perforation             Throat- Mallampati III ,  mucosa clear , drainage- none, tonsils- atrophic Neck- flexible , trachea midline, no stridor , thyroid nl, carotid no bruit Chest - symmetrical excursion , unlabored           Heart/CV- RRR , no murmur , no gallop  , no rub, nl s1 s2                           - JVD- none , edema- none, stasis changes- none, varices- none           Lung- very clear to P&A today, wheeze- none, cough- none , dullness-none, rub- none           Chest wall-  Abd-  Br/ Gen/ Rectal- Not done, not indicated Extrem- cyanosis- none, clubbing, none, atrophy- none, strength- nl Neuro- grossly intact to observation

## 2012-09-05 NOTE — Patient Instructions (Addendum)
We can continue present treatments  Please call as needed 

## 2012-09-05 NOTE — Telephone Encounter (Signed)
Letter generated pt here to pick up letter...lmb

## 2012-09-11 ENCOUNTER — Telehealth: Payer: Self-pay | Admitting: Internal Medicine

## 2012-09-11 NOTE — Telephone Encounter (Signed)
Please advise if okay to refill. Thanks.  

## 2012-09-11 NOTE — Telephone Encounter (Signed)
Ok refill lorazepam

## 2012-09-11 NOTE — Telephone Encounter (Signed)
rx called into pharmacy

## 2012-09-14 ENCOUNTER — Other Ambulatory Visit: Payer: Self-pay | Admitting: Internal Medicine

## 2012-09-15 NOTE — Assessment & Plan Note (Signed)
Recent exacerbation pretty clearly related [place] He feels well and back to baseline now.

## 2012-09-15 NOTE — Assessment & Plan Note (Signed)
He had his wife are satisfied to continue allergy vaccine.

## 2012-09-15 NOTE — Assessment & Plan Note (Signed)
Continues good compliance and control 

## 2012-09-24 ENCOUNTER — Ambulatory Visit (INDEPENDENT_AMBULATORY_CARE_PROVIDER_SITE_OTHER): Payer: Medicare Other

## 2012-09-24 ENCOUNTER — Telehealth: Payer: Self-pay | Admitting: Internal Medicine

## 2012-09-24 DIAGNOSIS — J309 Allergic rhinitis, unspecified: Secondary | ICD-10-CM

## 2012-09-24 NOTE — Telephone Encounter (Signed)
Will forward this to Dimas Millin to advise on allergy shots, thanks

## 2012-09-24 NOTE — Telephone Encounter (Signed)
Tammy have these been received? If so pt needs call to make them aware so that they may schedule appt to begin allergy shots.   ATC x 1 -- no answer//no machine

## 2012-09-25 ENCOUNTER — Other Ambulatory Visit: Payer: Self-pay | Admitting: Dermatology

## 2012-09-25 NOTE — Telephone Encounter (Signed)
I called [name] and told him he should get his vac. Toady or tomorrow. He said he didn't get it toady. I asked him to call back if he didn't get it in the mail tomorrow.

## 2012-09-27 ENCOUNTER — Telehealth: Payer: Self-pay

## 2012-09-27 MED ORDER — CHOLESTYRAMINE 4 G PO PACK: 1.0000 | PACK | Freq: Two times a day (BID) | ORAL | Status: DC | PRN

## 2012-09-27 NOTE — Telephone Encounter (Signed)
Refilled Questran 

## 2012-10-05 ENCOUNTER — Ambulatory Visit (INDEPENDENT_AMBULATORY_CARE_PROVIDER_SITE_OTHER): Payer: Medicare Other | Admitting: Family Medicine

## 2012-10-05 VITALS — BP 165/65 | HR 86 | Temp 97.4°F | Resp 17 | Ht 62.0 in | Wt 162.0 lb

## 2012-10-05 DIAGNOSIS — M25472 Effusion, left ankle: Secondary | ICD-10-CM

## 2012-10-05 DIAGNOSIS — M25473 Effusion, unspecified ankle: Secondary | ICD-10-CM

## 2012-10-05 DIAGNOSIS — M25476 Effusion, unspecified foot: Secondary | ICD-10-CM

## 2012-10-05 DIAGNOSIS — J4541 Moderate persistent asthma with (acute) exacerbation: Secondary | ICD-10-CM

## 2012-10-05 DIAGNOSIS — J4489 Other specified chronic obstructive pulmonary disease: Secondary | ICD-10-CM

## 2012-10-05 DIAGNOSIS — J45901 Unspecified asthma with (acute) exacerbation: Secondary | ICD-10-CM

## 2012-10-05 DIAGNOSIS — J449 Chronic obstructive pulmonary disease, unspecified: Secondary | ICD-10-CM

## 2012-10-05 MED ORDER — ALBUTEROL SULFATE (2.5 MG/3ML) 0.083% IN NEBU
2.5000 mg | INHALATION_SOLUTION | Freq: Once | RESPIRATORY_TRACT | Status: AC
  Administered 2012-10-05: 2.5 mg via RESPIRATORY_TRACT

## 2012-10-05 MED ORDER — IPRATROPIUM BROMIDE 0.02 % IN SOLN
0.5000 mg | Freq: Once | RESPIRATORY_TRACT | Status: AC
  Administered 2012-10-05: 0.5 mg via RESPIRATORY_TRACT

## 2012-10-05 MED ORDER — PREDNISONE 20 MG PO TABS: ORAL_TABLET | ORAL | Status: DC

## 2012-10-05 MED ORDER — METHYLPREDNISOLONE SODIUM SUCC 125 MG IJ SOLR
125.0000 mg | Freq: Once | INTRAMUSCULAR | Status: AC
  Administered 2012-10-05: 125 mg via INTRAMUSCULAR

## 2012-10-05 MED ORDER — ALBUTEROL SULFATE (2.5 MG/3ML) 0.083% IN NEBU: 2.5000 mg | INHALATION_SOLUTION | Freq: Once | RESPIRATORY_TRACT | Status: DC

## 2012-10-05 NOTE — Progress Notes (Signed)
Subjective: 79 year old man who has COPD and asthma and has frequently had to get treatments. He started getting bad yesterday and he used his nebulizer at home. He was still wheezing pretty tightly today so he came on in. He doesn't like to get much worse than this before he gets treated more aggressively. He was stable when he saw his pulmonologist a few weeks ago. I was the one who last treated him for this with steroids last month. She is not coughing up any purulent phlegm.  Objective: Alert and oriented elderly man with an obvious wheeze. TMs normal. Throat clear. Neck supple without significant nodes. No obvious JVD. Chest has diffuse wheezing bilaterally. Has a little swelling in his left ankle since he fell off a ladder a few weeks ago.  Assessment: COPD and asthmatic bronchitis exacerbation Mild left leg edema  Plan: Breathing treatment with albuterol and Atrovent Solu-Medrol 125 mg IM Prednisone taper Continue his home breathing medications Return if worse

## 2012-10-05 NOTE — Patient Instructions (Addendum)
Continue to use your regular home breathing medications  Return if in all worse  Tomorrow morning begin the prednisone, 3 daily for 2 days, then 2 daily for 2 days, then one daily for 2 days  Drink plenty of fluids to keep any secretions thin.

## 2012-10-10 ENCOUNTER — Other Ambulatory Visit: Payer: Self-pay | Admitting: Dermatology

## 2012-10-16 ENCOUNTER — Ambulatory Visit (INDEPENDENT_AMBULATORY_CARE_PROVIDER_SITE_OTHER): Payer: Medicare Other | Admitting: Internal Medicine

## 2012-10-16 ENCOUNTER — Other Ambulatory Visit (INDEPENDENT_AMBULATORY_CARE_PROVIDER_SITE_OTHER): Payer: Medicare Other

## 2012-10-16 ENCOUNTER — Encounter: Payer: Self-pay | Admitting: Internal Medicine

## 2012-10-16 VITALS — BP 132/82 | HR 78 | Temp 98.2°F | Wt 162.0 lb

## 2012-10-16 DIAGNOSIS — D229 Melanocytic nevi, unspecified: Secondary | ICD-10-CM

## 2012-10-16 DIAGNOSIS — R7309 Other abnormal glucose: Secondary | ICD-10-CM

## 2012-10-16 DIAGNOSIS — D239 Other benign neoplasm of skin, unspecified: Secondary | ICD-10-CM

## 2012-10-16 DIAGNOSIS — R739 Hyperglycemia, unspecified: Secondary | ICD-10-CM

## 2012-10-16 DIAGNOSIS — E785 Hyperlipidemia, unspecified: Secondary | ICD-10-CM

## 2012-10-16 DIAGNOSIS — M810 Age-related osteoporosis without current pathological fracture: Secondary | ICD-10-CM

## 2012-10-16 LAB — LIPID PANEL
Cholesterol: 123 mg/dL (ref 0–200)
HDL: 41.1 mg/dL (ref >39.00)
LDL Cholesterol: 60 mg/dL (ref 0–99)
Triglycerides: 111 mg/dL (ref 0.0–149.0)

## 2012-10-16 NOTE — Patient Instructions (Signed)
It was good to see you today. We have reviewed your prior records including labs and tests today Will start Prolia injections every 6 months for management of your osteoporosis (bone loss) - my office will call you once this is approved to arrange for your 1st injection Test(s) ordered today. Your results will be released to MyChart (or called to you) after review, usually within 72hours after test completion. If any changes need to be made, you will be notified at that same time. Medications reviewed and updated, no changes recommended at this time. we'll make referral to male dermatology specialist . Our office will contact you regarding appointment(s) once made. Please schedule followup in 6 months, call sooner if problems.

## 2012-10-16 NOTE — Assessment & Plan Note (Signed)
On high dose prava - tolerating well, LDL<70 - at goal Check annually with CAD The current medical regimen is effective;  continue present plan and medications.

## 2012-10-16 NOTE — Assessment & Plan Note (Signed)
No hx fracture, freq pred use due to COPD Discussed med options for tx 05/2012 - given hx esophageal stricture, recommend Prolia injections -  Will start same after insurance approval - need to follow up on same  Continue WB exercise activity and take Ca+D BID Education on same provided at length today

## 2012-10-16 NOTE — Progress Notes (Signed)
Subjective:    Patient ID: [name]., male    DOB: 06/26/1930, 79 y.o.   MRN: [id number]  HPI  Here for follow up - Reviewed chronic medical issues today  New dx osteoporosis DEXA 05/2012, recommended to start prolia for same, but ?not approved? - dexa ordered by pulm due to freq pred use - no hx fracture, no falls, not smoking at this time - active at gym with WB activity, no Ca/D supplements  Severe COPD, chronic asthma, remote tobacco - follows with pulm for same - frequent flares related to weather change.  reports compliance with ongoing nebulizer treatment (steroid and Alb) but unable to afford Spiriva or Daliresp. Changed to [facility] spring 2013 -does not feel improved but denies adverse side effects related to current therapy. Recent exacerbations reviewed  CAD s/p CABG 2008, DES to saph v graft 06/2010 due to abn stress test - follows with cards for same - reports compliance with ongoing medical treatment and no changes in medication dose or frequency. denies adverse side effects related to current therapy. no chest pain or anginal symptoms   HTN -exacerbation summer 2013 with med changes reviewed- reports compliance with ongoing medical treatment. denies adverse side effects related to current therapy. no headache or weakness -  dyslipidemia - changed from simva 80mg  to prava 09/2009 due to FDA warnings - reports compliance with ongoing medical treatment and no other changes in medication dose or frequency. denies adverse side effects related to current therapy. no muscle pain or weakness   Past Medical History  Diagnosis Date  . CORONARY HEART DISEASE     a. s/p CABG;  b.  cath 06/27/10: S-Dx occluded, S-PDA 80-90% (tx with PCI); S-OM ok, L-LAD ok;  EF 65-70%  c.  s/p Promus DES to S-PDA 06/2010;   d. Myoview 8/12: low risk  . FIBRILLATION, ATRIAL     post op; ?documented during hosp. 06/2010  . SLEEP APNEA 09/2001    NPSG AHI 22/HR  . ALLERGIC RHINITIS   . ASTHMA   .  Depression   . GERD     with HH, hx esophageal stricture  . DYSLIPIDEMIA   . HYPERTENSION     Echo 3/12: EF 55-60%; mod LVH; mild AS/AI; LAE; PASP 38; mild pulmo HTN  . Personal history of alcoholism   . Diverticulosis   . Benign liver cyst   . Skin cancer     L forearm  . Cataract     surgery to both eyes  . Esophageal stricture   . Hiatal hernia     Review of Systems  Constitutional: Positive for fatigue. Negative for fever and unexpected weight change.  Respiratory: Positive for cough (dry) and shortness of breath (chronic). Negative for wheezing.   Cardiovascular: Negative for chest pain and leg swelling.  Musculoskeletal: Negative for myalgias and arthralgias.  Neurological: Negative for dizziness, weakness, light-headedness and headaches.      Objective:   Physical Exam  BP 132/82  Pulse 78  Temp(Src) 98.2 F (36.8 C) (Oral)  Wt 162 lb (73.483 kg)  BMI 29.62 kg/m2  SpO2 95% Wt Readings from Last 3 Encounters:  10/16/12 162 lb (73.483 kg)  10/05/12 162 lb (73.483 kg)  09/05/12 160 lb 6.4 oz (72.757 kg)   Constitutional:  He is short and overweight, appears well-developed and well-nourished. No distress. Cardiovascular: Normal rate, regular rhythm and normal heart sounds.  No murmur heard. no BLE edema Pulmonary/Chest: resting mild dyspnea, worse with conversation -  but at baseline. No respiratory distress. Mild inspiratory and expiratory wheeze but fair air movement B.  Skin: Solar and senile changes, chronic hemochromatic and tattooed forearms. Skin is warm and dry.  No erythema or ulceration.  Psychiatric: he has a normal mood and affect. behavior is normal. Judgment and thought content normal.      Lab Results  Component Value Date   WBC 8.2 04/26/2012   HGB 12.1* 04/26/2012   HCT 35.8* 04/26/2012   PLT 228.0 04/26/2012   CHOL 119 10/04/2011   TRIG 114.0 10/04/2011   HDL 34.50* 10/04/2011   ALT 18 02/14/2012   AST 15 02/14/2012   NA 142 02/14/2012   K 4.0  02/14/2012   CL 105 02/14/2012   CREATININE 0.9 02/14/2012   BUN 22 02/14/2012   CO2 29 02/14/2012   TSH 1.08 02/14/2012   PSA 0.62 10/10/2010   INR 1.0 04/26/2012   HGBA1C 6.1 02/14/2012    Assessment & Plan:   See problem list. Medications and labs reviewed today.  Hyperglycemia - exacerbated by frequent steroids for pulm dz - check a1c semiannually and continue attention to diet as ongoing  Skin changes - needs new derm due to insurance changes - follows every 52mo due to hx "cancerous" chabges

## 2012-10-17 ENCOUNTER — Telehealth: Payer: Self-pay | Admitting: *Deleted

## 2012-10-17 ENCOUNTER — Telehealth: Payer: Self-pay | Admitting: Internal Medicine

## 2012-10-17 MED ORDER — LORAZEPAM 0.5 MG PO TABS: 0.5000 mg | ORAL_TABLET | Freq: Two times a day (BID) | ORAL | Status: DC | PRN

## 2012-10-17 NOTE — Telephone Encounter (Signed)
Called refill to pharmacy voicemail.  

## 2012-10-17 NOTE — Telephone Encounter (Signed)
Ok to refill w/ ref x 5

## 2012-10-17 NOTE — Telephone Encounter (Signed)
Message copied by Deatra James on Thu Oct 17, 2012  1:53 PM ------      Message from: Clover Mealy      Created: Thu Oct 17, 2012 11:34 AM      Regarding: RE: [place],            Mr. Dollinger ins h/approved his Prolia inj.  He w/h a 20% out-of-pocket cost of around $180.  Can you contact him & set up an appt please?      If he says yes, please let me know the date & time.              Thanks      Ruby            ----- Message -----         From: Deatra James, MA         Sent: 10/16/2012  11:08 AM           To: Crawford Givens McClinton      Subject: Prolia                                                   Good morning Mrs. Mora Bellman!            Dr. Felicity Coyer states she recommended this pt to get prolia back in May. I didn't see anything in chart stating it was ok. Can u check on this one!            Thanks Lucy       ------

## 2012-10-17 NOTE — Telephone Encounter (Signed)
Called pt spoke with wife not sure if he want to get will call us back to let us know if he want to still get...lmb

## 2012-10-18 ENCOUNTER — Encounter (INDEPENDENT_AMBULATORY_CARE_PROVIDER_SITE_OTHER): Payer: Medicare Other

## 2012-10-18 DIAGNOSIS — I6529 Occlusion and stenosis of unspecified carotid artery: Secondary | ICD-10-CM

## 2012-10-30 ENCOUNTER — Encounter: Payer: Self-pay | Admitting: Physician Assistant

## 2012-10-30 ENCOUNTER — Telehealth: Payer: Self-pay | Admitting: *Deleted

## 2012-10-30 ENCOUNTER — Other Ambulatory Visit: Payer: Self-pay | Admitting: Internal Medicine

## 2012-10-30 ENCOUNTER — Ambulatory Visit (INDEPENDENT_AMBULATORY_CARE_PROVIDER_SITE_OTHER): Payer: Medicare Other | Admitting: Physician Assistant

## 2012-10-30 ENCOUNTER — Ambulatory Visit (INDEPENDENT_AMBULATORY_CARE_PROVIDER_SITE_OTHER)
Admission: RE | Admit: 2012-10-30 | Discharge: 2012-10-30 | Disposition: A | Discharge status: Home / Self Care | Payer: Medicare Other | Source: Ambulatory Visit | Attending: Physician Assistant | Admitting: Physician Assistant

## 2012-10-30 ENCOUNTER — Other Ambulatory Visit: Payer: Self-pay | Admitting: Cardiology

## 2012-10-30 VITALS — BP 132/64 | HR 68 | Ht 62.0 in | Wt 164.0 lb

## 2012-10-30 DIAGNOSIS — M5090 Cervical disc disorder, unspecified, unspecified cervical region: Secondary | ICD-10-CM

## 2012-10-30 DIAGNOSIS — R200 Anesthesia of skin: Secondary | ICD-10-CM

## 2012-10-30 DIAGNOSIS — E785 Hyperlipidemia, unspecified: Secondary | ICD-10-CM

## 2012-10-30 DIAGNOSIS — R209 Unspecified disturbances of skin sensation: Secondary | ICD-10-CM

## 2012-10-30 DIAGNOSIS — R600 Localized edema: Secondary | ICD-10-CM

## 2012-10-30 DIAGNOSIS — I10 Essential (primary) hypertension: Secondary | ICD-10-CM

## 2012-10-30 DIAGNOSIS — R202 Paresthesia of skin: Secondary | ICD-10-CM

## 2012-10-30 DIAGNOSIS — R609 Edema, unspecified: Secondary | ICD-10-CM

## 2012-10-30 DIAGNOSIS — I5032 Chronic diastolic (congestive) heart failure: Secondary | ICD-10-CM | POA: Insufficient documentation

## 2012-10-30 DIAGNOSIS — R0989 Other specified symptoms and signs involving the circulatory and respiratory systems: Secondary | ICD-10-CM

## 2012-10-30 NOTE — Assessment & Plan Note (Signed)
Patient had Dopplers on 10/18/12 that showed impression of 40-59% RICA stenosis and forwarded 59% LICA stenosis. Severe proximal RECA stenosis. Probable left subclavian artery stenosis with mild cubital steel. Followup in 1 year.

## 2012-10-30 NOTE — Progress Notes (Signed)
HPI:  This is a 79 year old male patient with history of coronary artery disease status post CABG in 2008 and PCI of the SVG to PDA in 2012. His SVG to the OM and SVG to the LAD were patent EF 65-70%. He also has a history of asthma.  The patient comes in today complaining of right arm tingling from his shoulder down to his fingertips. He says this can happen when he is sleeping or working in the yard. It lasts 5-10 minutes and then goes away. It's been going on for quite a while. He says he never had this when he had his heart trouble. He denies any chest tightness, pressure, dyspnea, dizziness or presyncope. He has some dyspnea on exertion due to his asthma. He denies any cardiac symptoms.  He does have some ankle edema. He says this started about 10 days ago when his wife received a pacemaker and they began receiving meals from neighbors and friends. He says he is eating a lot of salt that he's not used to.  Allergies:  -- Codeine -- Nausea And Vomiting  Current Outpatient Prescriptions on File Prior to Visit: acidophilus (RISAQUAD) CAPS, Take 1 capsule by mouth daily. [facility] probiotics, Disp: , Rfl:  albuterol (PROVENTIL HFA;VENTOLIN HFA) 108 (90 BASE) MCG/ACT inhaler, Inhale 2 puffs into the lungs every 4 (four) hours as needed for wheezing or shortness of breath. For asthma, Disp: 1 Inhaler, Rfl: prn amLODipine (NORVASC) 5 MG tablet, take 1 tablet by mouth twice a day, Disp: 60 tablet, Rfl: 5 aspirin 81 MG tablet, Take 81 mg by mouth daily. , Disp: , Rfl:  bisoprolol (ZEBETA) 10 MG tablet, Take 1 tablet (10 mg total) by mouth daily., Disp: 30 tablet, Rfl: 6 budesonide (PULMICORT) 0.25 MG/2ML nebulizer solution, Take 0.25 mg by nebulization 2 (two) times daily as needed. For asthma, Disp: , Rfl:  calcium-vitamin D (OSCAL 500/200 D-3) 500-200 MG-UNIT per tablet, Take 1 tablet by mouth 2 (two) times daily., Disp: , Rfl:  cetirizine (ZYRTEC) 10 MG tablet, Take 10 mg by mouth daily.  , Disp: ,  Rfl:  cholestyramine (QUESTRAN) 4 G packet, Take 1 packet by mouth 2 (two) times daily as needed. For loose stools, Disp: 60 each, Rfl: 2 cloNIDine (CATAPRES) 0.3 MG tablet, take 2 tablets by mouth twice a day, Disp: 120 tablet, Rfl: 4 denosumab (PROLIA) 60 MG/ML SOLN injection, Inject 60 mg into the skin every 6 (six) months. Administer in upper arm, thigh, or abdomen, Disp: 1 mL, Rfl: 5 famotidine (PEPCID) 20 MG tablet, take 1 tablet by mouth at bedtime, Disp: 30 tablet, Rfl: 5 ipratropium-albuterol (DUONEB) 0.5-2.5 (3) MG/3ML SOLN, Take 3 mLs by nebulization every 6 (six) hours as needed. For asthma, Disp: 360 mL, Rfl: 5 KLOR-CON M20 20 MEQ tablet, take 1 tablet by mouth twice a day, Disp: 60 tablet, Rfl: 4 latanoprost (XALATAN) 0.005 % ophthalmic solution, Place 1 drop into both eyes at bedtime. , Disp: , Rfl:  loperamide (IMODIUM A-D) 2 MG tablet, Take 2 mg by mouth as needed. For loose stool, Disp: , Rfl:  LORazepam (ATIVAN) 0.5 MG tablet, Take 1 tablet (0.5 mg total) by mouth 2 (two) times daily as needed. For anxiety, Disp: 60 tablet, Rfl: 5 losartan (COZAAR) 50 MG tablet, Take 1 tablet (50 mg total) by mouth 2 (two) times daily., Disp: 60 tablet, Rfl: 5 modafinil (PROVIGIL) 200 MG tablet, , Disp: , Rfl:  nitroGLYCERIN (NITROSTAT) 0.4 MG SL tablet, take as directed, Disp: 25 tablet, Rfl: 11  pravastatin (PRAVACHOL) 80 MG tablet, take 1 tablet by mouth every evening, Disp: 30 tablet, Rfl: 6 montelukast (SINGULAIR) 10 MG tablet, take 1 tablet by mouth at bedtime, Disp: 30 tablet, Rfl: 11  No current facility-administered medications on file prior to visit.   Past Medical History:   CORONARY HEART DISEASE                                         Comment:a. s/p CABG;  b.  cath 06/27/10: S-Dx occluded,               S-PDA 80-90% (tx with PCI); S-OM ok, L-LAD ok;               EF 65-70%  c.  s/p Promus DES to S-PDA 06/2010;               d. Myoview 8/12: low risk   FIBRILLATION, ATRIAL                                            Comment:post op; ?documented during hosp. 06/2010   SLEEP APNEA                                     09/2001         Comment:NPSG AHI 22/HR   ALLERGIC RHINITIS                                            ASTHMA                                                       Depression                                                   GERD                                                           Comment:with HH, hx esophageal stricture   DYSLIPIDEMIA                                                 HYPERTENSION                                                   Comment:Echo 3/12: EF 55-60%; mod  LVH; mild AS/AI; LAE;              PASP 38; mild pulmo HTN   Personal history of alcoholism                               Diverticulosis                                               Benign liver cyst                                            Skin cancer                                                    Comment:L forearm   Cataract                                                       Comment:surgery to both eyes   Esophageal stricture                                         Hiatal hernia                                               Past Surgical History:   CORONARY ARTERY BYPASS GRAFT                     02/2007      HERNIA REPAIR                                    12/03/07      TONSILLECTOMY                                                 CORONARY ANGIOPLASTY WITH STENT PLACEMENT        07/2010       CATARACT EXTRACTION, BILATERAL                   2007         HERNIA REPAIR                                    unsure ?6*   CHOLECYSTECTOMY  02/21/2011     Comment:Procedure: LAPAROSCOPIC CHOLECYSTECTOMY WITH               INTRAOPERATIVE CHOLANGIOGRAM;  Surgeon: Valarie Merino, MD;  Location: [facility];  Service:               General;  Laterality: N/A;  Review of patient's family history indicates:   COPD                            Sister                   Asthma                         Sister                   Emphysema                      Sister                   Hypertension                   Sister                   Hypertension                   Mother                   Colon cancer                   Neg Hx                   Heart disease                  Brother                  Heart disease                  Mother                   Heart disease                  Father                   Social History   Marital Status: Married             Spouse Name:                      Years of Education:                 Number of children: 2           Occupational History Occupation          Associate Professor            Comment              retired                                 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 Married lives with wife 2 children    ROS: Patient does wear a hearing aid, See history of present illness otherwise negative   PHYSICAL EXAM: Well-nournished, in no acute distress. Neck: Bilateral carotid bruits, No JVD, HJR, or thyroid enlargement  Lungs: Decreased breath sounds but No tachypnea, clear without wheezing, rales, or rhonchi  Cardiovascular: RRR, PMI not displaced, 1-2/6 systolic murmur at the left sternal border and right sternal border, no gallops, bruit, thrill, or heave.  Abdomen: BS normal. Soft without organomegaly, masses, lesions or tenderness.  Extremities: +1 ankle edema, otherwise lower extremities without cyanosis, clubbing. Good distal pulses bilateral  SKin: Warm, no lesions or rashes    Musculoskeletal: No deformities  Neuro: no focal signs  BP 132/64  Pulse 68  Ht 5\' 2"  (1.575 m)  Wt 164 lb (74.39 kg)  BMI 29.99 kg/m2

## 2012-10-30 NOTE — Assessment & Plan Note (Signed)
Patient complains of bilateral lower extremity edema since he has been eating food brought in from neighbors and friends after his wife's pacemaker. He says he is getting a lot of salt but is not use to. I asked him to cut back on his salt, gave him a 2 g sodium diet, and to call if the swelling does not resolve.

## 2012-10-30 NOTE — Assessment & Plan Note (Signed)
Stable

## 2012-10-30 NOTE — Telephone Encounter (Signed)
Pt has seen Dr Annell Greening in past.  Told him that he will receive a call from our office

## 2012-10-30 NOTE — Assessment & Plan Note (Signed)
Patient complains of numbness and tingling down his right arm from his shoulder to his fingertips. This occurs at any time a day or while he is sleeping as well as with working in the yard. I don't believe it is cardiac related. We will order an x-ray of his C-spine and have him followup with his primary care.

## 2012-10-30 NOTE — Patient Instructions (Signed)
YOU ARE TO GO TO [facility] FOR A CERVICAL SPINE X-RAY TODAY  NO CHANGES WERE MADE TODAY WITH YOUR MEDICATIONS  2 Gram Low Sodium Diet A 2 gram sodium diet restricts the amount of sodium in the diet to no more than 2 g or 2000 mg daily. Limiting the amount of sodium is often used to help lower blood pressure. It is important if you have heart, liver, or kidney problems. Many foods contain sodium for flavor and sometimes as a preservative. When the amount of sodium in a diet needs to be low, it is important to know what to look for when choosing foods and drinks. The following includes some information and guidelines to help make it easier for you to adapt to a low sodium diet. QUICK TIPS  Do not add salt to food.  Avoid convenience items and fast food.  Choose unsalted snack foods.  Buy lower sodium products, often labeled as "lower sodium" or "no salt added."  Check food labels to learn how much sodium is in 1 serving.  When eating at a restaurant, ask that your food be prepared with less salt or none, if possible. READING FOOD LABELS FOR SODIUM INFORMATION The nutrition facts label is a good place to find how much sodium is in foods. Look for products with no more than 500 to 600 mg of sodium per meal and no more than 150 mg per serving. Remember that 2 g = 2000 mg. The food label may also list foods as:  Sodium-free: Less than 5 mg in a serving.  Very low sodium: 35 mg or less in a serving.  Low-sodium: 140 mg or less in a serving.  Light in sodium: 50% less sodium in a serving. For example, if a food that usually has 300 mg of sodium is changed to become light in sodium, it will have 150 mg of sodium.  Reduced sodium: 25% less sodium in a serving. For example, if a food that usually has 400 mg of sodium is changed to reduced sodium, it will have 300 mg of sodium. CHOOSING FOODS Grains  Avoid: Salted crackers and snack items. Some cereals, including instant hot  cereals. Bread stuffing and biscuit mixes. Seasoned rice or pasta mixes.  Choose: Unsalted snack items. Low-sodium cereals, oats, puffed wheat and rice, shredded wheat. English muffins and bread. Pasta. Meats  Avoid: Salted, canned, smoked, spiced, pickled meats, including fish and poultry. Bacon, ham, sausage, cold cuts, hot dogs, anchovies.  Choose: Low-sodium canned tuna and salmon. Fresh or frozen meat, poultry, and fish. Dairy  Avoid: Processed cheese and spreads. Cottage cheese. Buttermilk and condensed milk. Regular cheese.  Choose: Milk. Low-sodium cottage cheese. Yogurt. Sour cream. Low-sodium cheese. Fruits and Vegetables  Avoid: Regular canned vegetables. Regular canned tomato sauce and paste. Frozen vegetables in sauces. Olives. Rosita Fire. Relishes. Sauerkraut.  Choose: Low-sodium canned vegetables. Low-sodium tomato sauce and paste. Frozen or fresh vegetables. Fresh and frozen fruit. Condiments  Avoid: Canned and packaged gravies. Worcestershire sauce. Tartar sauce. Barbecue sauce. Soy sauce. Steak sauce. Ketchup. Onion, garlic, and table salt. Meat flavorings and tenderizers.  Choose: Fresh and dried herbs and spices. Low-sodium varieties of mustard and ketchup. Lemon juice. Tabasco sauce. Horseradish. SAMPLE 2 GRAM SODIUM MEAL PLAN Breakfast / Sodium (mg)  1 cup low-fat milk / 143 mg  2 slices whole-wheat toast / 270 mg  1 tbs heart-healthy margarine / 153 mg  1 hard-boiled egg / 139 mg  1 small orange / 0  mg Lunch / Sodium (mg)  1 cup raw carrots / 76 mg   cup hummus / 298 mg  1 cup low-fat milk / 143 mg   cup red grapes / 2 mg  1 whole-wheat pita bread / 356 mg Dinner / Sodium (mg)  1 cup whole-wheat pasta / 2 mg  1 cup low-sodium tomato sauce / 73 mg  3 oz lean ground beef / 57 mg  1 small side salad (1 cup raw spinach leaves,  cup cucumber,  cup yellow bell pepper) with 1 tsp olive oil and 1 tsp red wine vinegar / 25 mg Snack / Sodium  (mg)  1 container low-fat vanilla yogurt / 107 mg  3 graham cracker squares / 127 mg Nutrient Analysis  Calories: 2033  Protein: 77 g  Carbohydrate: 282 g  Fat: 72 g  Sodium: 1971 mg Document Released: 03/27/2005 Document Revised: 06/19/2011 Document Reviewed: 06/28/2009 [employer] Patient Information 2014 [place], [place].

## 2012-10-30 NOTE — Telephone Encounter (Signed)
Message copied by Antony Odea on Wed Oct 30, 2012  3:52 PM ------      Message from: Prescott Gum      Created: Wed Oct 30, 2012  3:38 PM       Refer to orthopedist for cervical neck problems ------

## 2012-10-30 NOTE — Assessment & Plan Note (Signed)
Lipid profile checked 2 weeks ago was stable. No change in medications.

## 2012-11-01 ENCOUNTER — Ambulatory Visit (INDEPENDENT_AMBULATORY_CARE_PROVIDER_SITE_OTHER): Payer: Medicare Other | Admitting: Internal Medicine

## 2012-11-01 VITALS — BP 164/64 | HR 91 | Temp 98.1°F | Resp 18 | Ht 61.5 in | Wt 164.6 lb

## 2012-11-01 DIAGNOSIS — J45901 Unspecified asthma with (acute) exacerbation: Secondary | ICD-10-CM

## 2012-11-01 DIAGNOSIS — R0602 Shortness of breath: Secondary | ICD-10-CM

## 2012-11-01 DIAGNOSIS — J4521 Mild intermittent asthma with (acute) exacerbation: Secondary | ICD-10-CM

## 2012-11-01 DIAGNOSIS — R062 Wheezing: Secondary | ICD-10-CM

## 2012-11-01 DIAGNOSIS — J441 Chronic obstructive pulmonary disease with (acute) exacerbation: Secondary | ICD-10-CM

## 2012-11-01 MED ORDER — ALBUTEROL SULFATE (2.5 MG/3ML) 0.083% IN NEBU
2.5000 mg | INHALATION_SOLUTION | Freq: Once | RESPIRATORY_TRACT | Status: AC
  Administered 2012-11-01: 2.5 mg via RESPIRATORY_TRACT

## 2012-11-01 MED ORDER — METHYLPREDNISOLONE ACETATE 80 MG/ML IJ SUSP
120.0000 mg | Freq: Once | INTRAMUSCULAR | Status: AC
  Administered 2012-11-01: 120 mg via INTRAMUSCULAR

## 2012-11-01 MED ORDER — IPRATROPIUM BROMIDE 0.02 % IN SOLN
0.5000 mg | Freq: Once | RESPIRATORY_TRACT | Status: AC
  Administered 2012-11-01: 0.5 mg via RESPIRATORY_TRACT

## 2012-11-01 NOTE — Progress Notes (Signed)
  Subjective:    Patient ID: [name]., male    DOB: 1931/03/02, 79 y.o.   MRN: [id number]  HPI Pt with long history of lung disease. He has started wheezing and coughing (green mucous). He says the breathing treatments help him. He sees Dr Maple Hudson.    Review of Systems     Objective:   Physical Exam        Assessment & Plan:

## 2012-11-01 NOTE — Progress Notes (Signed)
  Subjective:    Patient ID: [name]., male    DOB: [birth year], 79 y.o.   MRN: [id number]  HPI Has chronic pulmonary disease, is sob, Dr. Maple Hudson his pulmonary. He requests neb and depomedrol. He has refused new txs like spireva due to expense. No sputum now   Review of Systems Epic reviewed/ No sxs of heart disease today.    Objective:   Physical Exam  Vitals reviewed. Constitutional: He is oriented to person, place, and time. He appears well-nourished.  HENT:  Right Ear: External ear normal.  Left Ear: External ear normal.  Nose: Nose normal.  Eyes: EOM are normal. No scleral icterus.  Cardiovascular: Normal rate and regular rhythm.   Pulmonary/Chest: He is in respiratory distress. He has wheezes. He exhibits no tenderness.  Neurological: He is alert and oriented to person, place, and time. He exhibits normal muscle tone. Coordination normal.  Psychiatric: He has a normal mood and affect.     Neb Depomedrol IM     Assessment & Plan:  Chronic asthma/COPD/SOB Must see Dr. Maple Hudson for better control

## 2012-11-01 NOTE — Patient Instructions (Addendum)

## 2012-11-04 ENCOUNTER — Encounter: Payer: Self-pay | Admitting: Internal Medicine

## 2012-11-04 ENCOUNTER — Ambulatory Visit (INDEPENDENT_AMBULATORY_CARE_PROVIDER_SITE_OTHER): Payer: Medicare Other | Admitting: Internal Medicine

## 2012-11-04 VITALS — BP 118/90 | HR 93 | Ht 62.0 in | Wt 165.0 lb

## 2012-11-04 DIAGNOSIS — J45998 Other asthma: Secondary | ICD-10-CM

## 2012-11-04 DIAGNOSIS — J45909 Unspecified asthma, uncomplicated: Secondary | ICD-10-CM

## 2012-11-04 DIAGNOSIS — J309 Allergic rhinitis, unspecified: Secondary | ICD-10-CM

## 2012-11-04 DIAGNOSIS — G4733 Obstructive sleep apnea (adult) (pediatric): Secondary | ICD-10-CM

## 2012-11-04 MED ORDER — MOMETASONE FURO-FORMOTEROL FUM 100-5 MCG/ACT IN AERO: 2.0000 | INHALATION_SPRAY | Freq: Two times a day (BID) | RESPIRATORY_TRACT | Status: DC

## 2012-11-04 MED ORDER — MOMETASONE FURO-FORMOTEROL FUM 100-5 MCG/ACT IN AERO: INHALATION_SPRAY | RESPIRATORY_TRACT | Status: DC

## 2012-11-04 NOTE — Patient Instructions (Addendum)
Sample and Script Dulera 100   2 puffs then rise mouth, twice every day

## 2012-11-04 NOTE — Progress Notes (Signed)
Patient ID: [name], male    DOB: 1930-07-08, 79 y.o.   MRN: [id number]  HPI 11/16/10- 47 year old man followed for severe chronic asthma, sleep apnea, allergic rhinitis, complicated by HBP CAD history of atrial fibrillation, GERD Last here May 20, 2010 CPAP 10 all night every night- helps sleep. Asks refill Provigil to help with long drives.  Allergy vaccine GO definitely helps- he is convinced.  He notes no asthma and no prednisone since last November- credits frequent handwashing, avoiding people with colds and the allergy vaccine. No new concerns. Continues cardiac rehab.   03/03/11-  37 year old man followed for severe chronic asthma, sleep apnea, allergic rhinitis, complicated by HBP CAD history of atrial fibrillation, GERD. Wife here. He recently had his fourth hospital stay since March, for gallbladder pain, cholecystectomy, cardiac stent. Cholecystectomy was 2 weeks ago. Through these he had shortness of breath and cough which has persisted and keep him awake. His nose runs.  07/03/11- 33 year old man followed for severe chronic asthma, sleep apnea, allergic rhinitis, complicated by HBP, CAD, history of atrial fibrillation, GERD. Wife here. Since last here has been to urgent care in emergency room 4 times for exacerbations of asthma. On prednisone 10 mg/day since February from Dr [name]. Definitely having reflux events and we discussed how that may be the significant issue. Denies sinus infection. Had chest x-rays in January and February which she was told were negative. He asks to try increasing his CPAP pressure because he doesn't feel he is sleeping quite well enough. We discussed this.  07/18/11- 11 year old man followed for severe chronic asthma, sleep apnea, allergic rhinitis, complicated by HBP, CAD, history of atrial fibrillation, GERD. Wife here. Seen by Dr Daub-Increased SOB and wheezing; was put on prednisone to help keep patient out of hospital; still not feeling  much better. Has been on prednisone 60 mg daily for past 4 days but still significant shortness of breath with exertion and wheeze. No excess 2 chest x-rays have been clear and EKG "knot heart". Using home nebulizer with DuoNeb 4 times daily plus his rescue inhaler at least twice per day. Nothing seems infected. He has not recognized heartburn although that has been an issue in the past. Denies chest pain, fever, ankle edema or palpitation. Some nasal congestion without much sneezing. Has been indoors without much exposure to pollen.  08/08/11- 44 year old man followed for severe chronic asthma, sleep apnea, allergic rhinitis, complicated by HBP, CAD, history of atrial fibrillation, GERD. Wife here. Hospitalized April 11-17 with acute exacerbation of his chronic fixed asthma. Anxiety and reflux are thought to be important. He says he is "doing great" now. He feels an anxiety medication would help and we discussed this. Currently on prednisone 20 mg daily.  10/04/11-  22 year old man followed for severe chronic asthma, sleep apnea, allergic rhinitis, complicated by HBP, CAD, history of atrial fibrillation, GERD. Wife here  Pt states increase of sob,wheezing,fatigue since getting out of hospital . No hospitalizations since last here. He stays short of breath especially with exertional dyspnea but is comfortable sitting and when lying in bed. His wife points out he is able to do yard work using a [employer]. We reviewed Dr.Hochrein's cardiology note. Question, , each of his dyspnea is pulmonary versus cardiac. We were asked to draw labs at this visit. He was criticized for using his nebulizer machine too frequently before his hospitalization but has only used it a total of 7 times since then. He had remained on maintenance prednisone  since January. His wife gradually tapered it off so he has had none in the last 3 weeks. He continues, and wants to continue, allergy vaccine at 1:10, well tolerated. CXR 07/22/11-    reviewed with them: Findings:  Grossly unchanged enlarged cardiac silhouette and mediastinal  contours post median sternotomy and CABG. Atherosclerotic  calcification within the thoracic aorta. There is persistent mild  elevation of the right hemidiaphragm. Grossly unchanged bibasilar  heterogeneous opacities favored to represent atelectasis. Grossly  unchanged bones including mild compression deformity of a lower  thoracic vertebral body.  IMPRESSION:  No acute cardiopulmonary disease.  Original Report Authenticated By: Waynard Reeds, M.D.  Office Spirometry: FEV1 1.40/58%, FVC 2.44/78%, FEV1/FEC oh 0.57/73%, FEF 25-75% 0.56/23%.  01/08/12- 43 year old man followed for severe chronic asthma, sleep apnea, allergic rhinitis, complicated by HBP, CAD, history of atrial fibrillation, GERD. Wife here Needs refill for rescue inhaler as he is leaving for beach in am; breathing is doing pretty well; only one treatment since seeing [place] last. . Asks for an antibiotic to carry.  05/10/2012 Acute OV Complains of increased dyspnea , coughing and wheezing. This woke him up this morning and used his neb without relief then used his inhaler without any relief.  Dry cough and wheezing started last pm.  Patient denies any hemoptysis, orthopnea, PND, leg swelling, or calf pain. Patient reports that wheezes. Started 2-3 days ago and worsened. Last night. Prior to going to bed. He had started on prednisone earlier in the week. at 10 mg and is currently on 5 mg daily .  He had his rescue inhaler this am with some relief.  05/30/12- 57 year old man followed for severe chronic asthma, sleep apnea, allergic rhinitis, complicated by HBP, CAD, history of atrial fibrillation, GERD. Wife here He says he is a well now, off prednisone x2 weeks. Likes Mucinex DM. We discussed steroidsand I suggested bone density assessment. We discussed steroid sparing availability of Daliresp trial is frequency of bronchitic  exacerbations.  He continues using CPAP 12/ [facility] with no concerns.  09/05/12- 63 year old man followed for severe chronic asthma, sleep apnea, allergic rhinitis, complicated by HBP, CAD, history of atrial fibrillation, GERD.     Wife here FOLLOWS FOR: had to go to UC on 08-26-12 due to breathing issues. Was given shot, neb tx, and pred taper. Had caught a cold while fishing, leading to exacerbation. Finish prednisone taper 2 days ago. Feels "great" now. Allergy vaccine 1:10 GO CPAP 12/[facility] with good compliance and control CXR 08/26/12 IMPRESSION:  No active disease.  Original Report Authenticated By: Sander Radon, M.D.  11/04/16 -9 year old man followed for severe chronic asthma, sleep apnea, allergic rhinitis, complicated by HBP, CAD, history of atrial fibrillation/ pacemaker , GERD.     Wife here( She has mitochondrial myopathy) FOLOWS FOR: reports has been having diff w wheezing denies any chest tightness-- states he has been  seen in [facility] every month for breathing and per that MD patient may need some medication readjustment   wheeze and shortness of breath, sometimes green mucus. Gets better with neb been steroid but then relapses each time. Uses home nebulizer 3 times daily but doesn't last. Blamed Advair for 2 episodes of pneumonia-discussed. They feel he is doing well with allergy vaccine 1: 10 GO. OSA- good compliance and control with CPAP 12/ [facility].Marland Kitchen    ROS-see HPI Constitutional:   No-   weight loss, night sweats, fevers, chills, fatigue, lassitude. HEENT:   No-  headaches, difficulty swallowing, tooth/dental  problems, sore throat,       No-  sneezing, itching, ear ache, nasal congestion, post nasal drip,  CV:  No-   chest pain, orthopnea, PND, swelling in lower extremities, anasarca, dizziness, palpitations Resp: No-   shortness of breath with exertion or at rest.              No-   productive cough,  No non-productive cough,  No- coughing up of blood.               + change in color of mucus.  + wheezing.   Skin: No-   rash or lesions. GI:  No-   heartburn, indigestion, abdominal pain, nausea, vomiting,  GU:  MS:  No-   joint pain or swelling.   Neuro-     nothing unusual Psych:  No- change in mood or affect. No depression or anxiety.  No memory loss.  Objective:  OBJ- Physical Exam General- Alert, Oriented, Affect-appropriate, Distress- none acute Skin- rash-none, lesions- none, excoriation- none Lymphadenopathy- none Head- atraumatic            Eyes- Gross vision intact, PERRLA, conjunctivae and secretions clear            Ears- Hearing aid            Nose- Clear, no-Septal dev, mucus, polyps, erosion, perforation             Throat- Mallampati III , mucosa clear , drainage- none, tonsils- atrophic Neck- flexible , trachea midline, no stridor , thyroid nl, carotid no bruit Chest - symmetrical excursion , unlabored           Heart/CV- RRR , no murmur , no gallop  , no rub, nl s1 s2                           - JVD- none , edema- none, stasis changes- none, varices- none           Lung- +slight expiratory wheeze, unlabored, cough- none , dullness-none, rub- none           Chest wall-  Abd-  Br/ Gen/ Rectal- Not done, not indicated Extrem- cyanosis- none, clubbing, none, atrophy- none, strength- nl Neuro- grossly intact to observation

## 2012-11-15 NOTE — Telephone Encounter (Signed)
Patient has an appointment with Dr. Ophelia Charter 11/19/12 at 9:30.

## 2012-11-16 NOTE — Assessment & Plan Note (Signed)
He has had significantly improved seasonal pollen rhinitis and probably some seasonal asthma while on allergy vaccine.

## 2012-11-16 NOTE — Assessment & Plan Note (Addendum)
Control has been less good, acting more like a chronic bronchitis with repeated exacerbations.  He needs a LABA/ ICS. We discussed the slight increased incidence of pneumonias with inhaled steroids and we'll try again giving Dulera. We also anticipated repeat formal PFT.

## 2012-11-16 NOTE — Assessment & Plan Note (Signed)
Good compliance and control. They are satisfied.

## 2012-12-11 ENCOUNTER — Encounter: Payer: Self-pay | Admitting: Internal Medicine

## 2012-12-12 ENCOUNTER — Encounter: Payer: Self-pay | Admitting: Internal Medicine

## 2012-12-12 ENCOUNTER — Ambulatory Visit (INDEPENDENT_AMBULATORY_CARE_PROVIDER_SITE_OTHER): Payer: Medicare Other | Admitting: Internal Medicine

## 2012-12-12 VITALS — BP 140/72 | HR 83 | Temp 97.3°F | Ht 62.0 in | Wt 165.5 lb

## 2012-12-12 DIAGNOSIS — I10 Essential (primary) hypertension: Secondary | ICD-10-CM

## 2012-12-12 DIAGNOSIS — R600 Localized edema: Secondary | ICD-10-CM

## 2012-12-12 DIAGNOSIS — R609 Edema, unspecified: Secondary | ICD-10-CM

## 2012-12-12 DIAGNOSIS — J45909 Unspecified asthma, uncomplicated: Secondary | ICD-10-CM

## 2012-12-12 DIAGNOSIS — J45998 Other asthma: Secondary | ICD-10-CM

## 2012-12-12 MED ORDER — HYDROCHLOROTHIAZIDE 12.5 MG PO CAPS: ORAL_CAPSULE | ORAL | Status: DC

## 2012-12-12 NOTE — Patient Instructions (Signed)
Please take all new medication as prescribed - the fluid pill Please continue all other medications as before You are given the prescription for the compression stockings Please only drink fluids if you are thirsty

## 2012-12-12 NOTE — Progress Notes (Signed)
Subjective:    Patient ID: [name]., male    DOB: Mar 17, 1931, 80 y.o.   MRN: [id number]  HPI  Here to f/u with acute, c/o worseing LLE edema, has had bilat LE edema mild in the past that always seemed to resolve at night, but for 1 wk has edema to LLE below knee that has not done this;  Admits to drinking more fluids in the past few wks at the insistence of his wife who drinks at least 5-6 bottled waters per day, and chastises him for not doing the same.  No recent change in diet otherwise, does not use table salt, avoids obviously salty food, no worsening obesity or long standing, and tries to keep legs elevated somewhat during the day.  Pt denies chest pain, increased sob or doe, wheezing, orthopnea, PND palpitations, dizziness or syncope. No calf pain at rest or with ambulation. No redness, tender, fever.  No knee effusions or pain. Somewhat concerned about CHF.  Has compression stockings but worn out, does not use Past Medical History  Diagnosis Date  . CORONARY HEART DISEASE     a. s/p CABG;  b.  cath 06/27/10: S-Dx occluded, S-PDA 80-90% (tx with PCI); S-OM ok, L-LAD ok;  EF 65-70%  c.  s/p Promus DES to S-PDA 06/2010;   d. Myoview 8/12: low risk  . FIBRILLATION, ATRIAL     post op; ?documented during hosp. 06/2010  . SLEEP APNEA 09/2001    NPSG AHI 22/HR  . ALLERGIC RHINITIS   . ASTHMA   . Depression   . GERD     with HH, hx esophageal stricture  . DYSLIPIDEMIA   . HYPERTENSION     Echo 3/12: EF 55-60%; mod LVH; mild AS/AI; LAE; PASP 38; mild pulmo HTN  . Personal history of alcoholism   . Diverticulosis   . Benign liver cyst   . Skin cancer     L forearm  . Cataract     surgery to both eyes  . Esophageal stricture   . Hiatal hernia    Past Surgical History  Procedure Laterality Date  . Coronary artery bypass graft  02/2007  . Hernia repair  12/03/07  . Tonsillectomy    . Coronary angioplasty with stent placement  07/2010  . Cataract extraction, bilateral  2007  .  Hernia repair  unsure ?60's  . Cholecystectomy  02/21/2011    Procedure: LAPAROSCOPIC CHOLECYSTECTOMY WITH INTRAOPERATIVE CHOLANGIOGRAM;  Surgeon: Valarie Merino, MD;  Location: [facility];  Service: General;  Laterality: N/A;    reports that he quit smoking about 33 years ago. His smoking use included Cigarettes. He has a 120 pack-year smoking history. He has never used smokeless tobacco. He reports that he does not drink alcohol or use illicit drugs. family history includes Asthma in his sister; COPD in his sister; Emphysema in his sister; Heart disease in his brother, father, and mother; Hypertension in his mother and sister. There is no history of Colon cancer. Allergies  Allergen Reactions  . Codeine Nausea And Vomiting   Current Outpatient Prescriptions on File Prior to Visit  Medication Sig Dispense Refill  . acidophilus (RISAQUAD) CAPS Take 1 capsule by mouth daily. [facility] probiotics      . albuterol (PROVENTIL HFA;VENTOLIN HFA) 108 (90 BASE) MCG/ACT inhaler Inhale 2 puffs into the lungs every 4 (four) hours as needed for wheezing or shortness of breath. For asthma  1 Inhaler  prn  . amLODipine (NORVASC) 5 MG tablet  take 1 tablet by mouth twice a day  60 tablet  5  . aspirin 81 MG tablet Take 81 mg by mouth daily.       . bisoprolol (ZEBETA) 10 MG tablet Take 1 tablet (10 mg total) by mouth daily.  30 tablet  6  . budesonide (PULMICORT) 0.25 MG/2ML nebulizer solution Take 0.25 mg by nebulization 2 (two) times daily as needed. For asthma      . calcium-vitamin D (OSCAL 500/200 D-3) 500-200 MG-UNIT per tablet Take 1 tablet by mouth 2 (two) times daily.      . cetirizine (ZYRTEC) 10 MG tablet Take 10 mg by mouth daily.        . cholestyramine (QUESTRAN) 4 G packet Take 1 packet by mouth 2 (two) times daily as needed. For loose stools  60 each  2  . cloNIDine (CATAPRES) 0.3 MG tablet take 2 tablets by mouth twice a day  120 tablet  4  . clopidogrel (PLAVIX) 75 MG tablet Take 75 mg by mouth  daily.      Marland Kitchen denosumab (PROLIA) 60 MG/ML SOLN injection Inject 60 mg into the skin every 6 (six) months. Administer in upper arm, thigh, or abdomen  1 mL  5  . famotidine (PEPCID) 20 MG tablet take 1 tablet by mouth at bedtime  30 tablet  5  . ipratropium-albuterol (DUONEB) 0.5-2.5 (3) MG/3ML SOLN Take 3 mLs by nebulization every 6 (six) hours as needed. For asthma  360 mL  5  . KLOR-CON M20 20 MEQ tablet take 1 tablet by mouth twice a day  60 tablet  11  . latanoprost (XALATAN) 0.005 % ophthalmic solution Place 1 drop into both eyes at bedtime.       Marland Kitchen loperamide (IMODIUM A-D) 2 MG tablet Take 2 mg by mouth as needed. For loose stool      . LORazepam (ATIVAN) 0.5 MG tablet Take 1 tablet (0.5 mg total) by mouth 2 (two) times daily as needed. For anxiety  60 tablet  5  . losartan (COZAAR) 50 MG tablet Take 1 tablet (50 mg total) by mouth 2 (two) times daily.  60 tablet  5  . modafinil (PROVIGIL) 200 MG tablet       . mometasone-formoterol (DULERA) 100-5 MCG/ACT AERO 2 puffs then rinse mouth, twice every day  1 Inhaler  prn  . mometasone-formoterol (DULERA) 100-5 MCG/ACT AERO Inhale 2 puffs into the lungs 2 (two) times daily.  1 Inhaler  0  . montelukast (SINGULAIR) 10 MG tablet take 1 tablet by mouth at bedtime  30 tablet  11  . nitroGLYCERIN (NITROSTAT) 0.4 MG SL tablet take as directed  25 tablet  11  . pravastatin (PRAVACHOL) 80 MG tablet take 1 tablet by mouth every evening  30 tablet  6   No current facility-administered medications on file prior to visit.   Review of Systems  Constitutional: Negative for unexpected weight change, or unusual diaphoresis  HENT: Negative for tinnitus.   Eyes: Negative for photophobia and visual disturbance.  Respiratory: Negative for choking and stridor.   Gastrointestinal: Negative for vomiting and blood in stool.  Genitourinary: Negative for hematuria and decreased urine volume.  Musculoskeletal: Negative for acute joint swelling Skin: Negative for  color change and wound.  Neurological: Negative for tremors and numbness other than noted  Psychiatric/Behavioral: Negative for decreased concentration or  hyperactivity.       Objective:   Physical Exam BP 140/72  Pulse 83  Temp(Src) 97.3  F (36.3 C) (Oral)  Ht 5\' 2"  (1.575 m)  Wt 165 lb 8 oz (75.07 kg)  BMI 30.26 kg/m2  SpO2 96% VS noted, not ill appaering Constitutional: Pt appears well-developed and well-nourished.  HENT: Head: NCAT.  Right Ear: External ear normal.  Left Ear: External ear normal.  Eyes: Conjunctivae and EOM are normal. Pupils are equal, round, and reactive to light.  Neck: Normal range of motion. Neck supple.  Cardiovascular: Normal rate and regular rhythm.   Pulmonary/Chest: Effort normal and breath sounds normal.  Neurological: Pt is alert. Not confused  Skin: Skin is warm. No erythema. LE with trace to 1+ edema to knees bilat left > right, no ulcers, red, or calf tender, has numerous varicosities to LE's - no phlebitis Psychiatric: Pt behavior is normal. Thought content normal.     Assessment & Plan:

## 2012-12-13 NOTE — Assessment & Plan Note (Signed)
With worsening LLE only, in that has had persistent edema recentlly mildly worse and not resolving at night, wt up 3 lbs from July 6 visit, has been intentionally drinking more fluids recently; suspect some worsening of his venous insufficiency, and not worsening CHF, pulm HTN or other  Advised he does not need to drink more fluids if not thirsty and can safely cut back, to cont exercise as he does, wt control, low salt diet, leg elevation; also for compression stockings, and hct 12.5 qd prn swelling only, will hold on other such as ecg/echo but should f/u with PCP for any worsening s/s

## 2012-12-13 NOTE — Assessment & Plan Note (Signed)
Borderline elev today but states usually better controlled,  to f/u any worsening symptoms or concerns  BP Readings from Last 3 Encounters:  12/12/12 140/72  11/04/12 118/90  11/01/12 164/64

## 2012-12-13 NOTE — Assessment & Plan Note (Signed)
No recent worsening by symptoms, exam benign, doubt related to worsening edema

## 2012-12-26 ENCOUNTER — Other Ambulatory Visit: Payer: Self-pay | Admitting: Internal Medicine

## 2012-12-27 ENCOUNTER — Other Ambulatory Visit: Payer: Self-pay | Admitting: Internal Medicine

## 2013-01-03 ENCOUNTER — Telehealth: Payer: Self-pay | Admitting: Internal Medicine

## 2013-01-03 ENCOUNTER — Encounter (HOSPITAL_COMMUNITY): Payer: Self-pay | Admitting: *Deleted

## 2013-01-03 ENCOUNTER — Emergency Department (HOSPITAL_COMMUNITY)
Admission: EM | Admit: 2013-01-03 | Discharge: 2013-01-03 | Disposition: A | Discharge status: Home / Self Care | Payer: Medicare Other | Source: Home / Self Care | Attending: Emergency Medicine | Admitting: Emergency Medicine

## 2013-01-03 DIAGNOSIS — R609 Edema, unspecified: Secondary | ICD-10-CM

## 2013-01-03 DIAGNOSIS — R600 Localized edema: Secondary | ICD-10-CM

## 2013-01-03 LAB — POCT I-STAT, CHEM 8
BUN: 9 mg/dL (ref 6–23)
Calcium, Ion: 1.13 mmol/L (ref 1.13–1.30)
Chloride: 97 mEq/L (ref 96–112)
Creatinine, Ser: 0.9 mg/dL (ref 0.50–1.35)
Glucose, Bld: 117 mg/dL — ABNORMAL HIGH (ref 70–99)
HCT: 39 % (ref 39.0–52.0)

## 2013-01-03 LAB — D-DIMER, QUANTITATIVE: D-Dimer, Quant: 0.58 ug/mL-FEU — ABNORMAL HIGH (ref 0.00–0.48)

## 2013-01-03 MED ORDER — ENOXAPARIN SODIUM 80 MG/0.8ML ~~LOC~~ SOLN
1.0000 mg/kg | Freq: Once | SUBCUTANEOUS | Status: AC
  Administered 2013-01-03: 19:00:00 75 mg via SUBCUTANEOUS

## 2013-01-03 NOTE — ED Provider Notes (Signed)
Chief Complaint:   Chief Complaint  Patient presents with  . Leg Swelling    History of Present Illness:   Stephen Yo. is a 79 year old male who comes in today because of swelling of both of his legs. This is been going on for about a month, although he states it's been going on for a longer time than this, possibly years, it's just been worse for the past month. He has seen his primary care physician for this and been prescribed support hose and hydrochlorothiazide. He states this is not working and wants Lasix. He called his primary care physician today and she told him to come here to rule out DVT. He has a cardiologist and has been told he does not have congestive heart failure. He denies any pain in the legs. He isn't having any pain in the chest or shortness of breath, cough, or hemoptysis. He denies any kidney or liver problems.  Review of Systems:  Other than noted above, the patient denies any of the following symptoms: Systemic:  No fever, chills, sweats, weight gain or loss. Respiratory:  No coughing, wheezing, or shortness of breath. Cardiac:  No chest pain, tightness, pressure or syncope. GI:  No abdominal pain, swelling, distension, nausea, or vomiting. GU:  No dysuria, frequency, or hematuria. Ext:  No joint pain, muscle pain, or weakness. Skin:  No rash or itching. Neuro:  No paresthesias.  PMFSH:  Past medical history, family history, social history, meds, and allergies were reviewed.  He is allergic to codeine. He takes albuterol, Norvasc, aspirin, bisoprolol, Pulmicort, Zyrtec, Questran, Catapres, Plavix, Prolia, Pepcid, hydrochlorothiazide, DuoNeb, potassium chloride, Imodium, and lorazepam. He has a history of coronary artery disease, atrial fibrillation, sleep apnea, allergic rhinitis, asthma, depression, gastroesophageal reflux disease, dyslipidemia, hypertension, alcoholism, diverticulosis, esophageal stricture, and hiatal hernia. He is a former smoker but has  quit.  Physical Exam:   Vital signs:  There were no vitals taken for this visit. Gen:  Alert, oriented, in no distress. Neck:  No tenderness, adenopathy, or JVD. Lungs:  Breath sounds clear and equal bilaterally.  No rales, rhonchi or wheezes. Heart:  Regular rhythm, no gallops or murmers. Abdomen:  Soft, nontender, no organomegaly or mass. Ext:  He has pitting edema of both feet and ankles up to just above the malleoli and but not above this. There is no skin breakdown or dermatitis. No ulcerations. Pulses are full and capillary refill is good. Neuro:  Alert and oriented times 3.  No muscle weakness.  Sensation intact to light touch. Skin:  Warm and dry.  No rash or skin lesions.  Labs:   Results for orders placed during the hospital encounter of 01/03/13  D-DIMER, QUANTITATIVE      Result Value Range   D-Dimer, Quant 0.58 (*) 0.00 - 0.48 ug/mL-FEU  POCT I-STAT, CHEM 8      Result Value Range   Sodium 135  135 - 145 mEq/L   Potassium 3.6  3.5 - 5.1 mEq/L   Chloride 97  96 - 112 mEq/L   BUN 9  6 - 23 mg/dL   Creatinine, Ser 3.01  0.50 - 1.35 mg/dL   Glucose, Bld 601 (*) 70 - 99 mg/dL   Calcium, Ion 0.93  2.35 - 1.30 mmol/L   TCO2 26  0 - 100 mmol/L   Hemoglobin 13.3  13.0 - 17.0 g/dL   HCT 57.3  22.0 - 25.4 %    Course in Urgent Care Center:  He was  given Lovenox 75 mg which is 1 mg per kilogram and scheduled for a venous duplex of both legs tomorrow morning. He is to come here afterwards to discuss his results.  Assessment:  The encounter diagnosis was Bilateral leg edema.  Differential diagnosis includes DVT, venous insufficiency, or congestive heart failure.  Plan:   1.  Meds:  The following meds were prescribed:   Discharge Medication List as of 01/03/2013  6:37 PM      2.  Patient Education/Counseling:  The patient was given appropriate handouts, self care instructions, and instructed in symptomatic relief.  He is to stay off his feet elevated legs tonight.  3.   Follow up:  The patient was told to follow up if no better in 3 to 4 days, if becoming worse in any way, and given some red flag symptoms such as chest pain or shortness of breath which would prompt immediate return.  Follow up here tomorrow to discuss his venous duplex results, or if he should become worse in any way, particularly chest pain or shortness of breath, is to go directly to the emergency department.     Reuben Likes, MD 01/03/13 2212

## 2013-01-03 NOTE — Telephone Encounter (Signed)
Patient Information:  Caller Name: [name]  Phone: [phone]  Patient: [name], [name]  Gender: Male  DOB: [birth year]  Age: 79 Years  PCP: Rene Paci (Adults only)  Office Follow Up:  Does the office need to follow up with this patient?: Yes  Instructions For The Office: Suggest follow up call to reinforce need for immediate evaluation.  RN Note:  Weight increased but not measured. Asking if OK to use 79 yr old Lasix; advised not to use without MD order due to unknown kidney status. Is scheduled at [facility] for 0945 on 01/04/13. Advised he needs care this afternoon. Refuses to go to ED but might go to [facility].  Educated that with evaluation and treatment, he might avoid admission.  Symptoms  Reason For Call & Symptoms: Continued, worsening bilateral leg edema worse in right leg and now extending above the knee.  Seen twice by MD 10/16/12 and 12/12/12; advised it was not CHF, instruced to use pressure hose, elevate legs, use HCTZ daily.  Voided this morning when awoke about 0800.  Took double dose HCTZ at 0800 and did not void until 1400 .  Compression hose forces fluid to be above the knees. No edema of hands or face.  No coughing.  Reviewed Health History In EMR: Yes  Reviewed Medications In EMR: Yes  Reviewed Allergies In EMR: Yes  Reviewed Surgeries / Procedures: Yes  Date of Onset of Symptoms: 01/02/2013  Treatments Tried: HCTZ, pressure hose  Treatments Tried Worked: No  Guideline(s) Used:  Leg Swelling and Edema  Disposition Per Guideline:   Go to ED Now (or to Office with PCP Approval)  Reason For Disposition Reached:   Thigh, calf, or ankle swelling in both legs, but one side is definitely more swollen  Advice Given:  N/A  Patient Refused Recommendation:  [facility] To [facility]  Might be seen at [facility]

## 2013-01-03 NOTE — Telephone Encounter (Signed)
Called pt spoke with wife pt is already at [name]. She had made appt for sat clinic will cancel that appt...lmb

## 2013-01-03 NOTE — ED Notes (Signed)
Pt  Reports  Bilateral leg  Swelling            For  About 1  Month   Pt  Reports   Legs  Swell   At times  Not  Bad  Now           He  Reports  He  Is  On  Meds      But       Lasix  Helps him the most  He  denys  Any  Chest pain or  shrtness  Of  Breath        His  Legs   Are  Not  Swelling too bad  At this  Time  -  He  Ambulated  To  Room   With  A  Steady fluid

## 2013-01-03 NOTE — Telephone Encounter (Signed)
Please call patient to encourage him to go for urgent care evaluation, or tomorrow morning Saturday clinic

## 2013-01-03 NOTE — ED Notes (Signed)
messege  Left  With  Vascular  Lab    [phone]

## 2013-01-04 ENCOUNTER — Emergency Department (INDEPENDENT_AMBULATORY_CARE_PROVIDER_SITE_OTHER)
Admission: EM | Admit: 2013-01-04 | Discharge: 2013-01-04 | Disposition: A | Discharge status: Home / Self Care | Payer: Medicare Other | Source: Home / Self Care | Attending: Emergency Medicine | Admitting: Emergency Medicine

## 2013-01-04 ENCOUNTER — Ambulatory Visit (HOSPITAL_COMMUNITY)
Admission: RE | Admit: 2013-01-04 | Discharge: 2013-01-04 | Disposition: A | Discharge status: Home / Self Care | Payer: Medicare Other | Source: Ambulatory Visit | Attending: Emergency Medicine | Admitting: Emergency Medicine

## 2013-01-04 ENCOUNTER — Ambulatory Visit: Payer: Medicare Other | Admitting: Family Medicine

## 2013-01-04 ENCOUNTER — Encounter (HOSPITAL_COMMUNITY): Payer: Self-pay | Admitting: Emergency Medicine

## 2013-01-04 DIAGNOSIS — M7989 Other specified soft tissue disorders: Secondary | ICD-10-CM | POA: Insufficient documentation

## 2013-01-04 DIAGNOSIS — R609 Edema, unspecified: Secondary | ICD-10-CM

## 2013-01-04 DIAGNOSIS — R600 Localized edema: Secondary | ICD-10-CM

## 2013-01-04 MED ORDER — FUROSEMIDE 40 MG PO TABS: 40.0000 mg | ORAL_TABLET | Freq: Every day | ORAL | Status: DC

## 2013-01-04 NOTE — ED Notes (Signed)
Pt is here for a f/u from yest and needing Rx filled for Lasix Voices no new concerns... ambulated well to exam room w/NAD Alert w/no signs of acute distress.

## 2013-01-04 NOTE — ED Provider Notes (Signed)
Chief Complaint:   Chief Complaint  Patient presents with  . Follow-up    History of Present Illness:   [name]. is a 79 year old male who was here last night because of bilateral leg swelling. This is been going on for about a month, although he's had leg swelling off and on for years. Workup last night revealed a mildly elevated d-dimer. And therefore he was given Lovenox and sent to the hospital this morning for a venous Doppler. Venous Doppler came back negative and he returns today for followup.  Review of Systems:  Other than noted above, the patient denies any of the following symptoms: Systemic:  No fevers, chills, sweats, weight loss or gain, fatigue, or tiredness.  PMFSH:  Past medical history, family history, social history, meds, and allergies were reviewed.    Physical Exam:   Vital signs:  There were no vitals taken for this visit. General:  Alert and oriented.  In no distress.  Skin warm and dry. Extremities: He still has pitting ankle edema. No calf tenderness and Homans sign is negative.  Labs:   Results for orders placed during the hospital encounter of 01/03/13  D-DIMER, QUANTITATIVE      Result Value Range   D-Dimer, Quant 0.58 (*) 0.00 - 0.48 ug/mL-FEU  POCT I-STAT, CHEM 8      Result Value Range   Sodium 135  135 - 145 mEq/L   Potassium 3.6  3.5 - 5.1 mEq/L   Chloride 97  96 - 112 mEq/L   BUN 9  6 - 23 mg/dL   Creatinine, Ser 1.61  0.50 - 1.35 mg/dL   Glucose, Bld 096 (*) 70 - 99 mg/dL   Calcium, Ion 0.45  4.09 - 1.30 mmol/L   TCO2 26  0 - 100 mmol/L   Hemoglobin 13.3  13.0 - 17.0 g/dL   HCT 81.1  91.4 - 78.2 %     Radiology:  The venous Doppler was negative for DVT.  Assessment:  The encounter diagnosis was Bilateral leg edema.  Probably due to venous insufficiency.  Plan:   1.  Meds:  The following meds were prescribed:   Discharge Medication List as of 01/04/2013  9:28 AM    START taking these medications   Details  furosemide (LASIX)  40 MG tablet Take 1 tablet (40 mg total) by mouth daily., Starting 01/04/2013, Until Discontinued, Normal        2.  Patient Education/Counseling:  The patient was given appropriate handouts, self care instructions, and instructed in symptomatic relief.  Instructed to elevate legs, wear support hose, and minimize sodium intake.  3.  Follow up:  The patient was told to follow up if no better in 3 to 4 days, if becoming worse in any way, and given some red flag symptoms such as worsening edema or any difficulty breathing which would prompt immediate return.  Follow up with Dr. Rene Paci for ongoing care.      Reuben Likes, MD 01/04/13 1052

## 2013-01-04 NOTE — Progress Notes (Signed)
VASCULAR LAB PRELIMINARY  PRELIMINARY  PRELIMINARY  PRELIMINARY  Bilateral lower extremity venous Dopplers completed.    Preliminary report:  There is no DVT or SVT noted in the bilateral lower extremities.  Khilee Hendricksen, RVT 01/04/2013, 8:47 AM

## 2013-01-14 ENCOUNTER — Other Ambulatory Visit: Payer: Self-pay

## 2013-01-14 MED ORDER — PRAVASTATIN SODIUM 80 MG PO TABS: ORAL_TABLET | ORAL | Status: DC

## 2013-01-16 ENCOUNTER — Other Ambulatory Visit: Payer: Self-pay | Admitting: *Deleted

## 2013-02-12 ENCOUNTER — Ambulatory Visit (INDEPENDENT_AMBULATORY_CARE_PROVIDER_SITE_OTHER): Payer: Medicare Other

## 2013-02-12 DIAGNOSIS — J309 Allergic rhinitis, unspecified: Secondary | ICD-10-CM

## 2013-02-17 ENCOUNTER — Telehealth: Payer: Self-pay | Admitting: Internal Medicine

## 2013-02-17 MED ORDER — LORAZEPAM 0.5 MG PO TABS: 0.5000 mg | ORAL_TABLET | Freq: Two times a day (BID) | ORAL | Status: DC | PRN

## 2013-02-17 NOTE — Telephone Encounter (Signed)
Rx has been called in. Pt's wife is aware that rx has been called in.

## 2013-02-17 NOTE — Telephone Encounter (Signed)
Ok to refill his lorazepam as before, for total of 6 months

## 2013-02-17 NOTE — Telephone Encounter (Signed)
Spoke with pt's wife. [place] and [place]. Since they have come home, their local pharmacy will not honor the rx for Lorazepam since it was transferred. They are needing this refilled.  Last OV 11/04/12 Pending OV 03/12/13 Last fill 10/17/12 with 5 additional refills  CY - please advise on refill. Thanks.

## 2013-03-03 ENCOUNTER — Ambulatory Visit (INDEPENDENT_AMBULATORY_CARE_PROVIDER_SITE_OTHER): Payer: Medicare Other | Admitting: Internal Medicine

## 2013-03-03 ENCOUNTER — Encounter: Payer: Self-pay | Admitting: Internal Medicine

## 2013-03-03 VITALS — BP 130/80 | HR 90 | Temp 98.6°F | Wt 163.8 lb

## 2013-03-03 DIAGNOSIS — I10 Essential (primary) hypertension: Secondary | ICD-10-CM

## 2013-03-03 DIAGNOSIS — R609 Edema, unspecified: Secondary | ICD-10-CM

## 2013-03-03 DIAGNOSIS — R600 Localized edema: Secondary | ICD-10-CM

## 2013-03-03 DIAGNOSIS — I2510 Atherosclerotic heart disease of native coronary artery without angina pectoris: Secondary | ICD-10-CM

## 2013-03-03 MED ORDER — FUROSEMIDE 40 MG PO TABS: 40.0000 mg | ORAL_TABLET | Freq: Every day | ORAL | Status: DC

## 2013-03-03 MED ORDER — FUROSEMIDE 80 MG PO TABS: 80.0000 mg | ORAL_TABLET | Freq: Every day | ORAL | Status: DC

## 2013-03-03 NOTE — Patient Instructions (Addendum)
It was good to see you today.  We have reviewed your prior records including labs and tests today  Medications reviewed and updated, increase Lasix to 80mg  and take every DAY - and continue potassium 2x/day -no other changes recommended at this time.  Your prescription(s) have been submitted to your pharmacy. Please take as directed and contact our office if you believe you are having problem(s) with the medication(s).  we'll make referral to echo to evaluate heart function. Our office will contact you regarding appointment(s) once made.  Please schedule followup in 2-4 weeks, call sooner if problems.   Edema Edema is an abnormal build-up of fluids in tissues. Because this is partly dependent on gravity (water flows to the lowest place), it is more common in the legs and thighs (lower extremities). It is also common in the looser tissues, like around the eyes. Painless swelling of the feet and ankles is common and increases as a person ages. It may affect both legs and may include the calves or even thighs. When squeezed, the fluid may move out of the affected area and may leave a dent for a few moments. CAUSES   Prolonged standing or sitting in one place for extended periods of time. Movement helps pump tissue fluid into the veins, and absence of movement prevents this, resulting in edema.  Varicose veins. The valves in the veins do not work as well as they should. This causes fluid to leak into the tissues.  Fluid and salt overload.  Injury, burn, or surgery to the leg, ankle, or foot, may damage veins and allow fluid to leak out.  Sunburn damages vessels. Leaky vessels allow fluid to go out into the sunburned tissues.  Allergies (from insect bites or stings, medications or chemicals) cause swelling by allowing vessels to become leaky.  Protein in the blood helps keep fluid in your vessels. Low protein, as in malnutrition, allows fluid to leak out.  Hormonal changes, including  pregnancy and menstruation, cause fluid retention. This fluid may leak out of vessels and cause edema.  Medications that cause fluid retention. Examples are sex hormones, blood pressure medications, steroid treatment, or anti-depressants.  Some illnesses cause edema, especially heart failure, kidney disease, or liver disease.  Surgery that cuts veins or lymph nodes, such as surgery done for the heart or for breast cancer, may result in edema. DIAGNOSIS  Your caregiver is usually easily able to determine what is causing your swelling (edema) by simply asking what is wrong (getting a history) and examining you (doing a physical). Sometimes x-rays, EKG (electrocardiogram or heart tracing), and blood work may be done to evaluate for underlying medical illness. TREATMENT  General treatment includes:  Leg elevation (or elevation of the affected body part).  Restriction of fluid intake.  Prevention of fluid overload.  Compression of the affected body part. Compression with elastic bandages or support stockings squeezes the tissues, preventing fluid from entering and forcing it back into the blood vessels.  Diuretics (also called water pills or fluid pills) pull fluid out of your body in the form of increased urination. These are effective in reducing the swelling, but can have side effects and must be used only under your caregiver's supervision. Diuretics are appropriate only for some types of edema. The specific treatment can be directed at any underlying causes discovered. Heart, liver, or kidney disease should be treated appropriately. HOME CARE INSTRUCTIONS   Elevate the legs (or affected body part) above the level of the heart, while  lying down.  Avoid sitting or standing still for prolonged periods of time.  Avoid putting anything directly under the knees when lying down, and do not wear constricting clothing or garters on the upper legs.  Exercising the legs causes the fluid to work  back into the veins and lymphatic channels. This may help the swelling go down.  The pressure applied by elastic bandages or support stockings can help reduce ankle swelling.  A low-salt diet may help reduce fluid retention and decrease the ankle swelling.  Take any medications exactly as prescribed. SEEK MEDICAL CARE IF:  Your edema is not responding to recommended treatments. SEEK IMMEDIATE MEDICAL CARE IF:   You develop shortness of breath or chest pain.  You cannot breathe when you lay down; or if, while lying down, you have to get up and go to the window to get your breath.  You are having increasing swelling without relief from treatment.  You develop a fever over 102 F (38.9 C).  You develop pain or redness in the areas that are swollen.  Tell your caregiver right away if you have gained 03 lb/1.4 kg in 1 day or 05 lb/2.3 kg in a week. MAKE SURE YOU:   Understand these instructions.  Will watch your condition.  Will get help right away if you are not doing well or get worse. Document Released: 03/27/2005 Document Revised: 09/26/2011 Document Reviewed: 11/13/2007 [facility] Patient Information 2014 [place], [place].

## 2013-03-03 NOTE — Assessment & Plan Note (Addendum)
Bilateral -?exac by compression hose Review prior evaluation including negative lower survey Doppler for DVT Encourage compliance with daily diuretic as prescribed Increase to 80g daily also check 2-D echo to evaluate LV function given history of heart disease and severe oxygen-dependent COPD Also take potassium daily with furosemide Recheck in 2-4 weeks to reevaluate, patient call sooner if worse

## 2013-03-03 NOTE — Assessment & Plan Note (Signed)
Temporarily stopped amlodipine early 10/2011 due to mild dependant edema  - but then resumed started ARB 10/2011 and titrated to max Resumed low dose amlodipine late 10/2011, then titrated to max dose amlodipine 11/2011 with continue daily diuretics  Improved BP The current medical regimen is effective;  continue present plan and medications.  BP Readings from Last 3 Encounters:  03/03/13 130/80  12/12/12 140/72  11/04/12 118/90

## 2013-03-03 NOTE — Assessment & Plan Note (Signed)
DES 06/28/10- CABG 2008 - no anginal symptoms  The current medical regimen is effective; ASA, plavix, statin: continue present plan and medications.  Follow up with cards as ongoing

## 2013-03-03 NOTE — Progress Notes (Signed)
Subjective:    Patient ID: Stephen B[facility]., male    DOB: 1930/05/23, 79 y.o.   MRN: [id number]  HPI Here for continued edema OV 12/2012 with JJ for same reviewed -  Also reviewed chronic medical issues today  Severe COPD, chronic asthma, remote tobacco - follows with pulm for same - frequent flares related to weather change.  reports compliance with ongoing nebulizer treatment (steroid and Alb) but unable to afford Spiriva or Daliresp. Changed to [facility] spring 2013 -does not feel improved but denies adverse side effects related to current therapy. Recent exacerbations reviewed  CAD s/p CABG 2008, DES to saph v graft 06/2010 due to abn stress test - follows with cards for same - reports compliance with ongoing medical treatment and no changes in medication dose or frequency. denies adverse side effects related to current therapy. no chest pain or anginal symptoms   HTN -exacerbation summer 2013 with med changes reviewed- reports compliance with ongoing medical treatment. denies adverse side effects related to current therapy. no headache or weakness -  dyslipidemia - changed from simva 80mg  to prava 09/2009 due to FDA warnings - reports compliance with ongoing medical treatment and no other changes in medication dose or frequency. denies adverse side effects related to current therapy. no muscle pain or weakness   Past Medical History  Diagnosis Date  . CORONARY HEART DISEASE     a. s/p CABG;  b.  cath 06/27/10: S-Dx occluded, S-PDA 80-90% (tx with PCI); S-OM ok, L-LAD ok;  EF 65-70%  c.  s/p Promus DES to S-PDA 06/2010;   d. Myoview 8/12: low risk  . FIBRILLATION, ATRIAL     post op; ?documented during hosp. 06/2010  . SLEEP APNEA 09/2001    NPSG AHI 22/HR  . ALLERGIC RHINITIS   . ASTHMA   . Depression   . GERD     with HH, hx esophageal stricture  . DYSLIPIDEMIA   . HYPERTENSION     Echo 3/12: EF 55-60%; mod LVH; mild AS/AI; LAE; PASP 38; mild pulmo HTN  . Personal history of  alcoholism   . Diverticulosis   . Benign liver cyst   . Skin cancer     L forearm  . Cataract     surgery to both eyes  . Esophageal stricture   . Hiatal hernia     Review of Systems  Constitutional: Positive for fatigue. Negative for fever and unexpected weight change.  Respiratory: Positive for cough (dry) and shortness of breath (chronic). Negative for wheezing.   Cardiovascular: Negative for chest pain and leg swelling.  Musculoskeletal: Negative for arthralgias and myalgias.  Neurological: Negative for dizziness, weakness, light-headedness and headaches.      Objective:   Physical Exam BP 130/80  Pulse 90  Temp(Src) 98.6 F (37 C) (Oral)  Wt 163 lb 12.8 oz (74.299 kg)  SpO2 97% Wt Readings from Last 3 Encounters:  03/03/13 163 lb 12.8 oz (74.299 kg)  12/12/12 165 lb 8 oz (75.07 kg)  11/04/12 165 lb (74.844 kg)   Constitutional:  He is short and overweight, appears well-developed and well-nourished. No distress. Wife at side Cardiovascular: Normal rate, regular rhythm and normal heart sounds.  No murmur heard. 2+ pitting BLE edema Pulmonary/Chest: resting mild dyspnea, worse with conversation - but at baseline. No respiratory distress. Mild inspiratory and expiratory wheeze but fair air movement B.  Skin: Solar and senile changes, chronic hemochromatic and tattooed forearms. Skin is warm and dry.  No erythema  or ulceration.  Psychiatric: he has a normal mood and affect. behavior is normal. Judgment and thought content normal.      Lab Results  Component Value Date   WBC 8.2 04/26/2012   HGB 13.3 01/03/2013   HCT 39.0 01/03/2013   PLT 228.0 04/26/2012   CHOL 123 10/16/2012   TRIG 111.0 10/16/2012   HDL 41.10 10/16/2012   ALT 18 02/14/2012   AST 15 02/14/2012   NA 135 01/03/2013   K 3.6 01/03/2013   CL 97 01/03/2013   CREATININE 0.90 01/03/2013   BUN 9 01/03/2013   CO2 29 02/14/2012   TSH 1.08 02/14/2012   PSA 0.62 10/10/2010   INR 1.0 04/26/2012   HGBA1C 6.4 10/16/2012     Assessment & Plan:   See problem list. Medications and labs reviewed today.

## 2013-03-08 ENCOUNTER — Emergency Department (HOSPITAL_COMMUNITY): Payer: Medicare Other

## 2013-03-08 ENCOUNTER — Inpatient Hospital Stay (HOSPITAL_COMMUNITY)
Admission: EM | Admit: 2013-03-08 | Discharge: 2013-03-10 | DRG: 310 | Disposition: A | Discharge status: Home / Self Care | Payer: Medicare Other | Attending: Internal Medicine | Admitting: Internal Medicine

## 2013-03-08 ENCOUNTER — Encounter (HOSPITAL_COMMUNITY): Payer: Self-pay | Admitting: Emergency Medicine

## 2013-03-08 DIAGNOSIS — B353 Tinea pedis: Secondary | ICD-10-CM

## 2013-03-08 DIAGNOSIS — K219 Gastro-esophageal reflux disease without esophagitis: Secondary | ICD-10-CM

## 2013-03-08 DIAGNOSIS — K222 Esophageal obstruction: Secondary | ICD-10-CM

## 2013-03-08 DIAGNOSIS — G4733 Obstructive sleep apnea (adult) (pediatric): Secondary | ICD-10-CM

## 2013-03-08 DIAGNOSIS — Z9849 Cataract extraction status, unspecified eye: Secondary | ICD-10-CM

## 2013-03-08 DIAGNOSIS — J45909 Unspecified asthma, uncomplicated: Secondary | ICD-10-CM | POA: Diagnosis present

## 2013-03-08 DIAGNOSIS — F1021 Alcohol dependence, in remission: Secondary | ICD-10-CM

## 2013-03-08 DIAGNOSIS — F3289 Other specified depressive episodes: Secondary | ICD-10-CM | POA: Diagnosis present

## 2013-03-08 DIAGNOSIS — Z7901 Long term (current) use of anticoagulants: Secondary | ICD-10-CM

## 2013-03-08 DIAGNOSIS — I4891 Unspecified atrial fibrillation: Principal | ICD-10-CM

## 2013-03-08 DIAGNOSIS — K859 Acute pancreatitis without necrosis or infection, unspecified: Secondary | ICD-10-CM

## 2013-03-08 DIAGNOSIS — I480 Paroxysmal atrial fibrillation: Secondary | ICD-10-CM | POA: Insufficient documentation

## 2013-03-08 DIAGNOSIS — Z836 Family history of other diseases of the respiratory system: Secondary | ICD-10-CM

## 2013-03-08 DIAGNOSIS — K7689 Other specified diseases of liver: Secondary | ICD-10-CM

## 2013-03-08 DIAGNOSIS — R000 Tachycardia, unspecified: Secondary | ICD-10-CM

## 2013-03-08 DIAGNOSIS — D649 Anemia, unspecified: Secondary | ICD-10-CM | POA: Diagnosis present

## 2013-03-08 DIAGNOSIS — Z87891 Personal history of nicotine dependence: Secondary | ICD-10-CM

## 2013-03-08 DIAGNOSIS — Z9089 Acquired absence of other organs: Secondary | ICD-10-CM

## 2013-03-08 DIAGNOSIS — K449 Diaphragmatic hernia without obstruction or gangrene: Secondary | ICD-10-CM | POA: Diagnosis present

## 2013-03-08 DIAGNOSIS — Z85828 Personal history of other malignant neoplasm of skin: Secondary | ICD-10-CM

## 2013-03-08 DIAGNOSIS — Z825 Family history of asthma and other chronic lower respiratory diseases: Secondary | ICD-10-CM

## 2013-03-08 DIAGNOSIS — R600 Localized edema: Secondary | ICD-10-CM

## 2013-03-08 DIAGNOSIS — J45901 Unspecified asthma with (acute) exacerbation: Secondary | ICD-10-CM

## 2013-03-08 DIAGNOSIS — J302 Other seasonal allergic rhinitis: Secondary | ICD-10-CM

## 2013-03-08 DIAGNOSIS — Z9861 Coronary angioplasty status: Secondary | ICD-10-CM

## 2013-03-08 DIAGNOSIS — F329 Major depressive disorder, single episode, unspecified: Secondary | ICD-10-CM | POA: Diagnosis present

## 2013-03-08 DIAGNOSIS — I2510 Atherosclerotic heart disease of native coronary artery without angina pectoris: Secondary | ICD-10-CM

## 2013-03-08 DIAGNOSIS — Z79899 Other long term (current) drug therapy: Secondary | ICD-10-CM

## 2013-03-08 DIAGNOSIS — R0989 Other specified symptoms and signs involving the circulatory and respiratory systems: Secondary | ICD-10-CM

## 2013-03-08 DIAGNOSIS — R002 Palpitations: Secondary | ICD-10-CM

## 2013-03-08 DIAGNOSIS — R200 Anesthesia of skin: Secondary | ICD-10-CM

## 2013-03-08 DIAGNOSIS — I10 Essential (primary) hypertension: Secondary | ICD-10-CM

## 2013-03-08 DIAGNOSIS — E785 Hyperlipidemia, unspecified: Secondary | ICD-10-CM

## 2013-03-08 DIAGNOSIS — M810 Age-related osteoporosis without current pathological fracture: Secondary | ICD-10-CM

## 2013-03-08 DIAGNOSIS — Z8249 Family history of ischemic heart disease and other diseases of the circulatory system: Secondary | ICD-10-CM

## 2013-03-08 DIAGNOSIS — J45998 Other asthma: Secondary | ICD-10-CM

## 2013-03-08 DIAGNOSIS — E876 Hypokalemia: Secondary | ICD-10-CM

## 2013-03-08 LAB — CBC
HCT: 42.6 % (ref 39.0–52.0)
Hemoglobin: 14.2 g/dL (ref 13.0–17.0)
MCH: 28.3 pg (ref 26.0–34.0)
MCHC: 33.3 g/dL (ref 30.0–36.0)
MCV: 84.9 fL (ref 78.0–100.0)
RBC: 5.02 MIL/uL (ref 4.22–5.81)
RDW: 13.8 % (ref 11.5–15.5)
WBC: 8.7 10*3/uL (ref 4.0–10.5)

## 2013-03-08 LAB — BASIC METABOLIC PANEL
BUN: 27 mg/dL — ABNORMAL HIGH (ref 6–23)
Calcium: 9.4 mg/dL (ref 8.4–10.5)
Chloride: 99 mEq/L (ref 96–112)
Creatinine, Ser: 1.13 mg/dL (ref 0.50–1.35)
GFR calc Af Amer: 69 mL/min — ABNORMAL LOW (ref >90)
GFR calc non Af Amer: 59 mL/min — ABNORMAL LOW (ref >90)
Glucose, Bld: 118 mg/dL — ABNORMAL HIGH (ref 70–99)

## 2013-03-08 LAB — D-DIMER, QUANTITATIVE: D-Dimer, Quant: 0.83 ug/mL-FEU — ABNORMAL HIGH (ref 0.00–0.48)

## 2013-03-08 MED ORDER — SODIUM CHLORIDE 0.9 % IV BOLUS (SEPSIS)
500.0000 mL | Freq: Once | INTRAVENOUS | Status: AC
  Administered 2013-03-08: 500 mL via INTRAVENOUS

## 2013-03-08 MED ORDER — DILTIAZEM HCL 25 MG/5ML IV SOLN: 10.0000 mg | Freq: Once | INTRAVENOUS | Status: DC

## 2013-03-08 MED ORDER — DILTIAZEM HCL 25 MG/5ML IV SOLN
10.0000 mg | Freq: Once | INTRAVENOUS | Status: AC
  Administered 2013-03-08: 10 mg via INTRAVENOUS
  Filled 2013-03-08: qty 5

## 2013-03-08 MED ORDER — ONDANSETRON HCL 4 MG/2ML IJ SOLN
INTRAMUSCULAR | Status: AC
  Filled 2013-03-08: qty 2

## 2013-03-08 MED ORDER — ADENOSINE 6 MG/2ML IV SOLN
INTRAVENOUS | Status: AC
  Filled 2013-03-08: qty 6

## 2013-03-08 MED ORDER — DILTIAZEM HCL 100 MG IV SOLR
5.0000 mg/h | Freq: Once | INTRAVENOUS | Status: AC
  Administered 2013-03-08: 5 mg/h via INTRAVENOUS

## 2013-03-08 MED ORDER — ONDANSETRON HCL 4 MG/2ML IJ SOLN
4.0000 mg | Freq: Once | INTRAMUSCULAR | Status: AC
  Administered 2013-03-08: 4 mg via INTRAVENOUS

## 2013-03-08 MED ORDER — ADENOSINE 6 MG/2ML IV SOLN: 6.0000 mg | Freq: Once | INTRAVENOUS | Status: DC

## 2013-03-08 NOTE — ED Notes (Signed)
[name] phone number for updates [phone]

## 2013-03-08 NOTE — ED Notes (Addendum)
X 2 days not feeling well. Emesis. Low grade fever. HR 135's and BP 189/100's. No cp. Dependent edema unresolved.

## 2013-03-09 ENCOUNTER — Inpatient Hospital Stay (HOSPITAL_COMMUNITY): Payer: Medicare Other

## 2013-03-09 DIAGNOSIS — I498 Other specified cardiac arrhythmias: Secondary | ICD-10-CM

## 2013-03-09 DIAGNOSIS — R0989 Other specified symptoms and signs involving the circulatory and respiratory systems: Secondary | ICD-10-CM

## 2013-03-09 DIAGNOSIS — I4891 Unspecified atrial fibrillation: Principal | ICD-10-CM

## 2013-03-09 DIAGNOSIS — I379 Nonrheumatic pulmonary valve disorder, unspecified: Secondary | ICD-10-CM

## 2013-03-09 DIAGNOSIS — I10 Essential (primary) hypertension: Secondary | ICD-10-CM

## 2013-03-09 LAB — RAPID URINE DRUG SCREEN, HOSP PERFORMED
Barbiturates: NOT DETECTED
Benzodiazepines: NOT DETECTED

## 2013-03-09 LAB — TROPONIN I
Troponin I: <0.3 ng/mL (ref <0.30)
Troponin I: <0.3 ng/mL (ref <0.30)

## 2013-03-09 LAB — TSH
TSH: 1.837 u[IU]/mL (ref 0.350–4.500)
TSH: 1.66 u[IU]/mL (ref 0.350–4.500)

## 2013-03-09 LAB — T4, FREE: Free T4: 0.98 ng/dL (ref 0.80–1.80)

## 2013-03-09 MED ORDER — MOMETASONE FURO-FORMOTEROL FUM 100-5 MCG/ACT IN AERO
2.0000 | INHALATION_SPRAY | Freq: Two times a day (BID) | RESPIRATORY_TRACT | Status: DC
  Administered 2013-03-09 – 2013-03-10 (×3): 2 via RESPIRATORY_TRACT
  Filled 2013-03-09: qty 8.8

## 2013-03-09 MED ORDER — CALCIUM CARBONATE-VITAMIN D 500-200 MG-UNIT PO TABS
1.0000 | ORAL_TABLET | Freq: Two times a day (BID) | ORAL | Status: DC
  Administered 2013-03-09 – 2013-03-10 (×3): 1 via ORAL
  Filled 2013-03-09 (×4): qty 1

## 2013-03-09 MED ORDER — HEPARIN (PORCINE) IN NACL 100-0.45 UNIT/ML-% IJ SOLN
1150.0000 [IU]/h | INTRAMUSCULAR | Status: DC
  Administered 2013-03-09 (×2): 1000 [IU]/h via INTRAVENOUS
  Filled 2013-03-09 (×5): qty 250

## 2013-03-09 MED ORDER — ONDANSETRON HCL 4 MG/2ML IJ SOLN: 4.0000 mg | Freq: Four times a day (QID) | INTRAMUSCULAR | Status: DC | PRN

## 2013-03-09 MED ORDER — CLOPIDOGREL BISULFATE 75 MG PO TABS
75.0000 mg | ORAL_TABLET | Freq: Every day | ORAL | Status: DC
  Administered 2013-03-09 – 2013-03-10 (×2): 75 mg via ORAL
  Filled 2013-03-09 (×3): qty 1

## 2013-03-09 MED ORDER — CETAPHIL MOISTURIZING EX LOTN
TOPICAL_LOTION | CUTANEOUS | Status: DC | PRN
  Administered 2013-03-09: 03:00:00 via TOPICAL
  Filled 2013-03-09: qty 473

## 2013-03-09 MED ORDER — LOSARTAN POTASSIUM 50 MG PO TABS
50.0000 mg | ORAL_TABLET | Freq: Two times a day (BID) | ORAL | Status: DC
  Administered 2013-03-09 – 2013-03-10 (×3): 50 mg via ORAL
  Filled 2013-03-09 (×4): qty 1

## 2013-03-09 MED ORDER — ASPIRIN 81 MG PO TABS: 81.0000 mg | ORAL_TABLET | Freq: Every day | ORAL | Status: DC

## 2013-03-09 MED ORDER — CLONIDINE HCL 0.3 MG PO TABS
0.3000 mg | ORAL_TABLET | Freq: Every day | ORAL | Status: DC
  Administered 2013-03-09 – 2013-03-10 (×2): 0.3 mg via ORAL
  Filled 2013-03-09 (×2): qty 1

## 2013-03-09 MED ORDER — SODIUM CHLORIDE 0.9 % IJ SOLN: 3.0000 mL | Freq: Two times a day (BID) | INTRAMUSCULAR | Status: DC

## 2013-03-09 MED ORDER — HEPARIN BOLUS VIA INFUSION
3000.0000 [IU] | Freq: Once | INTRAVENOUS | Status: AC
  Administered 2013-03-09: 3000 [IU] via INTRAVENOUS
  Filled 2013-03-09: qty 3000

## 2013-03-09 MED ORDER — DENOSUMAB 60 MG/ML ~~LOC~~ SOLN: 60.0000 mg | SUBCUTANEOUS | Status: DC

## 2013-03-09 MED ORDER — ONDANSETRON HCL 4 MG PO TABS: 4.0000 mg | ORAL_TABLET | Freq: Four times a day (QID) | ORAL | Status: DC | PRN

## 2013-03-09 MED ORDER — DEXTROSE-NACL 5-0.9 % IV SOLN
INTRAVENOUS | Status: DC
  Administered 2013-03-09: 50 mL/h via INTRAVENOUS
  Administered 2013-03-09: 02:00:00 via INTRAVENOUS

## 2013-03-09 MED ORDER — AMLODIPINE BESYLATE 5 MG PO TABS
5.0000 mg | ORAL_TABLET | Freq: Two times a day (BID) | ORAL | Status: DC
  Filled 2013-03-09 (×2): qty 1

## 2013-03-09 MED ORDER — ATORVASTATIN CALCIUM 20 MG PO TABS
20.0000 mg | ORAL_TABLET | Freq: Every day | ORAL | Status: DC
  Administered 2013-03-09 – 2013-03-10 (×2): 20 mg via ORAL
  Filled 2013-03-09 (×2): qty 1

## 2013-03-09 MED ORDER — FUROSEMIDE 80 MG PO TABS
80.0000 mg | ORAL_TABLET | Freq: Every day | ORAL | Status: DC
  Administered 2013-03-09 – 2013-03-10 (×2): 80 mg via ORAL
  Filled 2013-03-09 (×2): qty 1

## 2013-03-09 MED ORDER — IOHEXOL 350 MG/ML SOLN
80.0000 mL | Freq: Once | INTRAVENOUS | Status: AC | PRN
  Administered 2013-03-09: 80 mL via INTRAVENOUS

## 2013-03-09 MED ORDER — CETAPHIL MOISTURIZING EX LOTN
1.0000 "application " | TOPICAL_LOTION | CUTANEOUS | Status: DC | PRN
  Filled 2013-03-09: qty 118

## 2013-03-09 MED ORDER — POTASSIUM CHLORIDE CRYS ER 20 MEQ PO TBCR
20.0000 meq | EXTENDED_RELEASE_TABLET | Freq: Two times a day (BID) | ORAL | Status: DC
  Administered 2013-03-09 (×2): 20 meq via ORAL
  Filled 2013-03-09 (×5): qty 1

## 2013-03-09 MED ORDER — LORAZEPAM 0.5 MG PO TABS
0.5000 mg | ORAL_TABLET | Freq: Two times a day (BID) | ORAL | Status: DC
  Administered 2013-03-09 – 2013-03-10 (×3): 0.5 mg via ORAL
  Filled 2013-03-09 (×4): qty 1

## 2013-03-09 MED ORDER — DILTIAZEM HCL 100 MG IV SOLR
5.0000 mg/h | INTRAVENOUS | Status: DC
  Administered 2013-03-09 (×3): 5 mg/h via INTRAVENOUS
  Filled 2013-03-09 (×2): qty 100

## 2013-03-09 MED ORDER — LATANOPROST 0.005 % OP SOLN
1.0000 [drp] | Freq: Every day | OPHTHALMIC | Status: DC
  Administered 2013-03-09: 1 [drp] via OPHTHALMIC
  Filled 2013-03-09: qty 2.5

## 2013-03-09 MED ORDER — SIMVASTATIN 40 MG PO TABS: 40.0000 mg | ORAL_TABLET | Freq: Every day | ORAL | Status: DC

## 2013-03-09 MED ORDER — BISOPROLOL FUMARATE 10 MG PO TABS
10.0000 mg | ORAL_TABLET | Freq: Every day | ORAL | Status: DC
  Administered 2013-03-09 – 2013-03-10 (×2): 10 mg via ORAL
  Filled 2013-03-09 (×2): qty 1

## 2013-03-09 MED ORDER — CLONIDINE HCL 0.3 MG PO TABS: 0.3000 mg | ORAL_TABLET | Freq: Two times a day (BID) | ORAL | Status: DC

## 2013-03-09 MED ORDER — RISAQUAD PO CAPS
1.0000 | ORAL_CAPSULE | Freq: Every day | ORAL | Status: DC
  Administered 2013-03-09 – 2013-03-10 (×2): 1 via ORAL
  Filled 2013-03-09 (×2): qty 1

## 2013-03-09 MED ORDER — BUDESONIDE 0.25 MG/2ML IN SUSP
0.2500 mg | Freq: Every day | RESPIRATORY_TRACT | Status: DC
  Filled 2013-03-09 (×3): qty 2

## 2013-03-09 MED ORDER — ASPIRIN EC 81 MG PO TBEC
81.0000 mg | DELAYED_RELEASE_TABLET | Freq: Every day | ORAL | Status: DC
  Administered 2013-03-09 – 2013-03-10 (×2): 81 mg via ORAL
  Filled 2013-03-09 (×2): qty 1

## 2013-03-09 MED ORDER — MONTELUKAST SODIUM 10 MG PO TABS
10.0000 mg | ORAL_TABLET | Freq: Every day | ORAL | Status: DC
  Administered 2013-03-09: 10 mg via ORAL
  Filled 2013-03-09 (×2): qty 1

## 2013-03-09 MED ORDER — FAMOTIDINE 20 MG PO TABS
20.0000 mg | ORAL_TABLET | Freq: Every day | ORAL | Status: DC
  Administered 2013-03-09: 20 mg via ORAL
  Filled 2013-03-09 (×2): qty 1

## 2013-03-09 MED ORDER — KETOCONAZOLE 2 % EX CREA
TOPICAL_CREAM | Freq: Two times a day (BID) | CUTANEOUS | Status: DC
  Administered 2013-03-09: 21:00:00 via TOPICAL
  Filled 2013-03-09: qty 15

## 2013-03-09 MED ORDER — CLONIDINE HCL 0.3 MG PO TABS
0.6000 mg | ORAL_TABLET | Freq: Every day | ORAL | Status: DC
  Administered 2013-03-09: 0.6 mg via ORAL
  Filled 2013-03-09 (×2): qty 2

## 2013-03-09 NOTE — Progress Notes (Signed)
ANTICOAGULATION CONSULT NOTE - Initial Consult  Pharmacy Consult for Heparin Indication: atrial fibrillation  Allergies  Allergen Reactions  . Codeine Nausea And Vomiting    Patient Measurements: Height: 5\' 2"  (157.5 cm) Weight: 156 lb 9.6 oz (71.033 kg) (scale A) IBW/kg (Calculated) : 54.6  Vital Signs: Temp: 99.3 F (37.4 C) (11/30 0131) Temp src: Oral (11/30 0131) BP: 133/72 mmHg (11/30 0131) Pulse Rate: 111 (11/30 0131)  Labs:  Recent Labs  03/08/13 2022  HGB 14.2  HCT 42.6  PLT 210  CREATININE 1.13    Estimated Creatinine Clearance: 45.1 ml/min (by C-G formula based on Cr of 1.13).   Medical History: Past Medical History  Diagnosis Date  . CORONARY HEART DISEASE     a. s/p CABG;  b.  cath 06/27/10: S-Dx occluded, S-PDA 80-90% (tx with PCI); S-OM ok, L-LAD ok;  EF 65-70%  c.  s/p Promus DES to S-PDA 06/2010;   d. Myoview 8/12: low risk  . FIBRILLATION, ATRIAL     post op; ?documented during hosp. 06/2010  . SLEEP APNEA 09/2001    NPSG AHI 22/HR  . ALLERGIC RHINITIS   . ASTHMA   . Depression   . GERD     with HH, hx esophageal stricture  . DYSLIPIDEMIA   . HYPERTENSION     Echo 3/12: EF 55-60%; mod LVH; mild AS/AI; LAE; PASP 38; mild pulmo HTN  . Personal history of alcoholism   . Diverticulosis   . Benign liver cyst   . Skin cancer     L forearm  . Cataract     surgery to both eyes  . Esophageal stricture   . Hiatal hernia     Medications:  Prescriptions prior to admission  Medication Sig Dispense Refill  . acidophilus (RISAQUAD) CAPS Take 1 capsule by mouth daily. Phillips probiotics      . albuterol (PROVENTIL HFA;VENTOLIN HFA) 108 (90 BASE) MCG/ACT inhaler Inhale 1-2 puffs into the lungs every 6 (six) hours as needed for wheezing or shortness of breath.      Marland Kitchen amLODipine (NORVASC) 5 MG tablet Take 5 mg by mouth 2 (two) times daily.      Marland Kitchen aspirin 81 MG tablet Take 81 mg by mouth daily.       . bisoprolol (ZEBETA) 10 MG tablet Take 10 mg by  mouth daily.      . budesonide (PULMICORT) 0.25 MG/2ML nebulizer solution Take 0.25 mg by nebulization 2 (two) times daily as needed. For asthma      . calcium-vitamin D (OSCAL WITH D) 500-200 MG-UNIT per tablet Take 1 tablet by mouth 2 (two) times daily.      . cetaphil (CETAPHIL) lotion Apply 1 application topically as needed for dry skin.      . cloNIDine (CATAPRES) 0.3 MG tablet Take 0.3-0.6 mg by mouth 2 (two) times daily. Takes 0.3mg  in the morning and 0.6mg  at night      . clopidogrel (PLAVIX) 75 MG tablet Take 75 mg by mouth daily.      Marland Kitchen denosumab (PROLIA) 60 MG/ML SOLN injection Inject 60 mg into the skin every 6 (six) months. Administer in upper arm, thigh, or abdomen  1 mL  5  . famotidine (PEPCID) 20 MG tablet Take 20 mg by mouth at bedtime.      . furosemide (LASIX) 80 MG tablet Take 80 mg by mouth daily.      Marland Kitchen ipratropium-albuterol (DUONEB) 0.5-2.5 (3) MG/3ML SOLN Take 3 mLs by nebulization every  6 (six) hours as needed (for shortness of breath).      . latanoprost (XALATAN) 0.005 % ophthalmic solution Place 1 drop into both eyes at bedtime.       Marland Kitchen loperamide (IMODIUM A-D) 2 MG tablet Take 2 mg by mouth as needed. For loose stool      . LORazepam (ATIVAN) 0.5 MG tablet Take 0.5 mg by mouth 2 (two) times daily.      Marland Kitchen losartan (COZAAR) 50 MG tablet Take 50 mg by mouth 2 (two) times daily.      . modafinil (PROVIGIL) 200 MG tablet Take 200 mg by mouth daily as needed (when driving long distances).       . mometasone-formoterol (DULERA) 100-5 MCG/ACT AERO Inhale 2 puffs into the lungs 2 (two) times daily.      . montelukast (SINGULAIR) 10 MG tablet Take 10 mg by mouth at bedtime.      . nitroGLYCERIN (NITROSTAT) 0.4 MG SL tablet Place 0.4 mg under the tongue every 5 (five) minutes as needed for chest pain.      . potassium chloride SA (K-DUR,KLOR-CON) 20 MEQ tablet Take 20 mEq by mouth 2 (two) times daily.      . pravastatin (PRAVACHOL) 80 MG tablet Take 80 mg by mouth daily.         Assessment: 79 yo male with Afib for heparin  Goal of Therapy:  Heparin level 0.3-0.7 units/ml Monitor platelets by anticoagulation protocol: Yes   Plan:  Heparin 3000 units IV bolus, then 1000 units/hr Check heparin level in 8 hours.  Harith Mccadden, Gary Fleet 03/09/2013,1:36 AM

## 2013-03-09 NOTE — H&P (Signed)
[facility] History and Physical  Montreal Steidle. NWG:[id number] DOB: [birth year]    PCP:   Rene Paci, MD   Chief Complaint: Palpitation.  HPI: Stephen Perz. is an 79 y.o. male with hx of known CAD, s/p DES to S-PDA 2012, hx of afib on ASA and Plavix, sleep apnea with AHI 22, distant history of alcohol abuse, presents to the ER with 3 days history of palpitation without shortness of breath or chest pain.  He said he felt his heart pounding for the past 3 days.  There was no fever, chills, but some nausea and vomiting.  He hadn't stopped any new medication nor started any new one.  He has Provigil, but said he hadn't taken it for several months.  In the ER, originally he had what appeared to be SVT at 170, and Adenosine was about to be given, but his rhythm changed to afib with RVR at 130.  Other work up included a normal WBC, Hb, and renal fx tests.  His CXR was clear and one EKG showed ST, while a repeated EKG showed afib with RVR.  He was then given IV Cardiazem and then a drip.  His rate slowed to about 110.  He felt better, and hositalist was asked to admit him for further Tx.   Rewiew of Systems:  Constitutional: Negative for malaise, fever and chills. No significant weight loss or weight gain Eyes: Negative for eye pain, redness and discharge, diplopia, visual changes, or flashes of light. ENMT: Negative for ear pain, hoarseness, nasal congestion, sinus pressure and sore throat. No headaches; tinnitus, drooling, or problem swallowing. Cardiovascular: Negative for chest pain,  diaphoresis, dyspnea and peripheral edema. ; No orthopnea, PND Respiratory: Negative for cough, hemoptysis, wheezing and stridor. No pleuritic chestpain. Gastrointestinal: Negative for  diarrhea, constipation, abdominal pain, melena, blood in stool, hematemesis, jaundice and rectal bleeding.    Genitourinary: Negative for frequency, dysuria, incontinence,flank pain and hematuria; Musculoskeletal:  Negative for back pain and neck pain. Negative for swelling and trauma.;  Skin: . Negative for pruritus, rash, abrasions, bruising and skin lesion.; ulcerations Neuro: Negative for headache, lightheadedness and neck stiffness. Negative for weakness, altered level of consciousness , altered mental status, extremity weakness, burning feet, involuntary movement, seizure and syncope.  Psych: negative for anxiety, depression, insomnia, tearfulness, panic attacks, hallucinations, paranoia, suicidal or homicidal ideation    Past Medical History  Diagnosis Date  . CORONARY HEART DISEASE     a. s/p CABG;  b.  cath 06/27/10: S-Dx occluded, S-PDA 80-90% (tx with PCI); S-OM ok, L-LAD ok;  EF 65-70%  c.  s/p Promus DES to S-PDA 06/2010;   d. Myoview 8/12: low risk  . FIBRILLATION, ATRIAL     post op; ?documented during hosp. 06/2010  . SLEEP APNEA 09/2001    NPSG AHI 22/HR  . ALLERGIC RHINITIS   . ASTHMA   . Depression   . GERD     with HH, hx esophageal stricture  . DYSLIPIDEMIA   . HYPERTENSION     Echo 3/12: EF 55-60%; mod LVH; mild AS/AI; LAE; PASP 38; mild pulmo HTN  . Personal history of alcoholism   . Diverticulosis   . Benign liver cyst   . Skin cancer     L forearm  . Cataract     surgery to both eyes  . Esophageal stricture   . Hiatal hernia     Past Surgical History  Procedure Laterality Date  . Coronary artery bypass  graft  02/2007  . Hernia repair  12/03/07  . Tonsillectomy    . Coronary angioplasty with stent placement  07/2010  . Cataract extraction, bilateral  2007  . Hernia repair  unsure ?60's  . Cholecystectomy  02/21/2011    Procedure: LAPAROSCOPIC CHOLECYSTECTOMY WITH INTRAOPERATIVE CHOLANGIOGRAM;  Surgeon: Valarie Merino, MD;  Location: [facility];  Service: General;  Laterality: N/A;    Medications:  HOME MEDS: Prior to Admission medications   Medication Sig Start Date End Date Taking? Authorizing Provider  acidophilus (RISAQUAD) CAPS Take 1 capsule by mouth daily.  [facility] probiotics   Yes Historical Provider, MD  albuterol (PROVENTIL HFA;VENTOLIN HFA) 108 (90 BASE) MCG/ACT inhaler Inhale 1-2 puffs into the lungs every 6 (six) hours as needed for wheezing or shortness of breath.   Yes Historical Provider, MD  amLODipine (NORVASC) 5 MG tablet Take 5 mg by mouth 2 (two) times daily.   Yes Historical Provider, MD  aspirin 81 MG tablet Take 81 mg by mouth daily.    Yes Historical Provider, MD  bisoprolol (ZEBETA) 10 MG tablet Take 10 mg by mouth daily.   Yes Historical Provider, MD  budesonide (PULMICORT) 0.25 MG/2ML nebulizer solution Take 0.25 mg by nebulization 2 (two) times daily as needed. For asthma   Yes Historical Provider, MD  calcium-vitamin D (OSCAL WITH D) 500-200 MG-UNIT per tablet Take 1 tablet by mouth 2 (two) times daily.   Yes Historical Provider, MD  cetaphil (CETAPHIL) lotion Apply 1 application topically as needed for dry skin.   Yes Historical Provider, MD  cloNIDine (CATAPRES) 0.3 MG tablet Take 0.3-0.6 mg by mouth 2 (two) times daily. Takes 0.3mg  in the morning and 0.6mg  at night   Yes Historical Provider, MD  clopidogrel (PLAVIX) 75 MG tablet Take 75 mg by mouth daily.   Yes Historical Provider, MD  denosumab (PROLIA) 60 MG/ML SOLN injection Inject 60 mg into the skin every 6 (six) months. Administer in upper arm, thigh, or abdomen 06/12/12  Yes Newt Lukes, MD  famotidine (PEPCID) 20 MG tablet Take 20 mg by mouth at bedtime.   Yes Historical Provider, MD  furosemide (LASIX) 80 MG tablet Take 80 mg by mouth daily.   Yes Historical Provider, MD  ipratropium-albuterol (DUONEB) 0.5-2.5 (3) MG/3ML SOLN Take 3 mLs by nebulization every 6 (six) hours as needed (for shortness of breath).   Yes Historical Provider, MD  latanoprost (XALATAN) 0.005 % ophthalmic solution Place 1 drop into both eyes at bedtime.    Yes Historical Provider, MD  loperamide (IMODIUM A-D) 2 MG tablet Take 2 mg by mouth as needed. For loose stool 08/30/11  Yes Hilarie Fredrickson, MD  LORazepam (ATIVAN) 0.5 MG tablet Take 0.5 mg by mouth 2 (two) times daily.   Yes Historical Provider, MD  losartan (COZAAR) 50 MG tablet Take 50 mg by mouth 2 (two) times daily.   Yes Historical Provider, MD  modafinil (PROVIGIL) 200 MG tablet Take 200 mg by mouth daily as needed (when driving long distances).  12/11/11  Yes Waymon Budge, MD  mometasone-formoterol (DULERA) 100-5 MCG/ACT AERO Inhale 2 puffs into the lungs 2 (two) times daily.   Yes Historical Provider, MD  montelukast (SINGULAIR) 10 MG tablet Take 10 mg by mouth at bedtime.   Yes Historical Provider, MD  nitroGLYCERIN (NITROSTAT) 0.4 MG SL tablet Place 0.4 mg under the tongue every 5 (five) minutes as needed for chest pain.   Yes Historical Provider, MD  potassium chloride  SA (K-DUR,KLOR-CON) 20 MEQ tablet Take 20 mEq by mouth 2 (two) times daily.   Yes Historical Provider, MD  pravastatin (PRAVACHOL) 80 MG tablet Take 80 mg by mouth daily.   Yes Historical Provider, MD     Allergies:  Allergies  Allergen Reactions  . Codeine Nausea And Vomiting    Social History:   reports that he quit smoking about 33 years ago. His smoking use included Cigarettes. He has a 120 pack-year smoking history. He has never used smokeless tobacco. He reports that he does not drink alcohol or use illicit drugs.  Family History: Family History  Problem Relation Age of Onset  . COPD Sister   . Asthma Sister   . Emphysema Sister   . Hypertension Sister   . Hypertension Mother   . Colon cancer Neg Hx   . Heart disease Brother   . Heart disease Mother   . Heart disease Father      Physical Exam: Filed Vitals:   03/08/13 2330 03/08/13 2345 03/09/13 0045 03/09/13 0131  BP: 150/63 125/73 120/65 133/72  Pulse: 125 115 109 111  Temp:    99.3 F (37.4 C)  TempSrc:    Oral  Resp: 15 14 11 16   Height:      Weight:    71.033 kg (156 lb 9.6 oz)  SpO2: 98% 96% 96% 93%   Blood pressure 133/72, pulse 111, temperature 99.3 F (37.4  C), temperature source Oral, resp. rate 16, height 5\' 2"  (1.575 m), weight 71.033 kg (156 lb 9.6 oz), SpO2 93.00%.  GEN:  Pleasant  patient lying in the stretcher in no acute distress; cooperative with exam. PSYCH:  alert and oriented x4; does not appear anxious or depressed; affect is appropriate. HEENT: Mucous membranes pink and anicteric; PERRLA; EOM intact; no cervical lymphadenopathy nor thyromegaly or carotid bruit; no JVD; There were no stridor. Neck is very supple. Breasts:: Not examined CHEST WALL: No tenderness CHEST: Normal respiration, clear to auscultation bilaterally.  HEART: Regular rate and rhythm.  There are no murmur, rub, or gallops.   BACK: No kyphosis or scoliosis; no CVA tenderness ABDOMEN: soft and non-tender; no masses, no organomegaly, normal abdominal bowel sounds; no pannus; no intertriginous candida. There is no rebound and no distention. Rectal Exam: Not done EXTREMITIES: No bone or joint deformity; age-appropriate arthropathy of the hands and knees; no edema; no ulcerations.  There is no calf tenderness. Genitalia: not examined PULSES: 2+ and symmetric SKIN: Normal hydration no rash or ulceration CNS: Cranial nerves 2-12 grossly intact no focal lateralizing neurologic deficit.  Speech is fluent; uvula elevated with phonation, facial symmetry and tongue midline. DTR are normal bilaterally, cerebella exam is intact, barbinski is negative and strengths are equaled bilaterally.  No sensory loss.   Labs on Admission:  Basic Metabolic Panel:  Recent Labs Lab 03/08/13 2022  NA 139  K 3.6  CL 99  CO2 28  GLUCOSE 118*  BUN 27*  CREATININE 1.13  CALCIUM 9.4   Liver Function Tests: No results found for this basename: AST, ALT, ALKPHOS, BILITOT, PROT, ALBUMIN,  in the last 168 hours No results found for this basename: LIPASE, AMYLASE,  in the last 168 hours No results found for this basename: AMMONIA,  in the last 168 hours CBC:  Recent Labs Lab  03/08/13 2022  WBC 8.7  HGB 14.2  HCT 42.6  MCV 84.9  PLT 210   Cardiac Enzymes: No results found for this basename: CKTOTAL, CKMB, CKMBINDEX, TROPONINI,  in the last 168 hours  CBG: No results found for this basename: GLUCAP,  in the last 168 hours   Radiological Exams on Admission: Dg Chest Port 1 View  03/08/2013   CLINICAL DATA:  Hypertension  EXAM: PORTABLE CHEST - 1 VIEW  COMPARISON:  08/26/2012  FINDINGS: Normal heart size. Clear lungs. No pneumothorax. No pleural effusion. No pulmonary edema.  IMPRESSION: No active disease.   Electronically Signed   By: Maryclare Bean M.D.   On: 03/08/2013 20:52    EKG: Independently reviewed. #1 showed ST with no acute ST T changes.  #2 showed Afib with RVR.   Assessment/Plan Present on Admission:  . Atrial fibrillation with RVR . CORONARY HEART DISEASE . DYSLIPIDEMIA . HYPERTENSION . Obstructive sleep apnea . Bruit of left carotid artery . HTN (hypertension) . Paroxysmal a-fib  PLAN:  I think the rhythm represented afib with RVR, at least on one of the EKGs.  IV cardiazem was started and his rate is better controlled now.  He has been on ASA and Plavix, but since he has been in this rhythm (by history) for at least 3 days now, and having hx of PAF, will start him on IV Heparin.  I have continued his other meds, and I have requested cardiolgoy consult to help managing him.  He is not having any shortness of breath nor chest pain, and his only symptom was nausea and vomiting when his HR was at 170.  He is comfortalble now.  I suspect he would benefit being on longterm anticoagulation as well.  His D-Dimer was slightly elevated, but I don't think he had a PE with no chest pain, and shortness of breath, so will hold off on CTPA.  He is stable, full code, and will be admitted to [facility] service.  Thank you for asking me to pariticpate in his care.  Other plans as per orders.  Code Status: FULL Stephen Lightning, MD. [facility] Pager  [phone] 7pm to 7am.  03/09/2013, 3:11 AM

## 2013-03-09 NOTE — Progress Notes (Signed)
R Reidler Notified of Pts HR 58-64 (SB/SR). Pt on cardizem gtt at 5mg /hr.  Pt to remain on gtt and continue to monitor.  Notify if HR drops 30s-40s.  Pt currently sleeping comfortably no distress or complaints.  Will continue to monitor. Dierdre Highman, RN

## 2013-03-09 NOTE — ED Provider Notes (Signed)
CSN: [id number]     Arrival date & time 03/08/13  2005 History   First MD Initiated Contact with Patient 03/08/13 2016     Chief Complaint  Patient presents with  . Hypertension  . Tachycardia   (Consider location/radiation/quality/duration/timing/severity/associated sxs/prior Treatment) Patient is a 79 y.o. male presenting with palpitations. The history is provided by the patient.  Palpitations Palpitations quality:  Regular Onset quality:  Sudden Timing:  Constant Progression:  Unchanged Chronicity:  New Context: not appetite suppressants, not bronchodilators, not caffeine, not exercise and not illicit drugs   Relieved by:  Nothing Worsened by:  Nothing tried Associated symptoms: vomiting   Associated symptoms: no cough     Past Medical History  Diagnosis Date  . CORONARY HEART DISEASE     a. s/p CABG;  b.  cath 06/27/10: S-Dx occluded, S-PDA 80-90% (tx with PCI); S-OM ok, L-LAD ok;  EF 65-70%  c.  s/p Promus DES to S-PDA 06/2010;   d. Myoview 8/12: low risk  . FIBRILLATION, ATRIAL     post op; ?documented during hosp. 06/2010  . SLEEP APNEA 09/2001    NPSG AHI 22/HR  . ALLERGIC RHINITIS   . ASTHMA   . Depression   . GERD     with HH, hx esophageal stricture  . DYSLIPIDEMIA   . HYPERTENSION     Echo 3/12: EF 55-60%; mod LVH; mild AS/AI; LAE; PASP 38; mild pulmo HTN  . Personal history of alcoholism   . Diverticulosis   . Benign liver cyst   . Skin cancer     L forearm  . Cataract     surgery to both eyes  . Esophageal stricture   . Hiatal hernia    Past Surgical History  Procedure Laterality Date  . Coronary artery bypass graft  02/2007  . Hernia repair  12/03/07  . Tonsillectomy    . Coronary angioplasty with stent placement  07/2010  . Cataract extraction, bilateral  2007  . Hernia repair  unsure ?60's  . Cholecystectomy  02/21/2011    Procedure: LAPAROSCOPIC CHOLECYSTECTOMY WITH INTRAOPERATIVE CHOLANGIOGRAM;  Surgeon: Valarie Merino, MD;  Location: [facility];   Service: General;  Laterality: N/A;   Family History  Problem Relation Age of Onset  . COPD Sister   . Asthma Sister   . Emphysema Sister   . Hypertension Sister   . Hypertension Mother   . Colon cancer Neg Hx   . Heart disease Brother   . Heart disease Mother   . Heart disease Father    History  Substance Use Topics  . Smoking status: Former Smoker -- 3.00 packs/day for 40 years    Types: Cigarettes    Quit date: 04/11/1979  . Smokeless tobacco: Never Used  . Alcohol Use: No    Review of Systems  Constitutional: Positive for chills. Negative for fever.  Respiratory: Negative for cough.   Cardiovascular: Positive for palpitations.  Gastrointestinal: Positive for vomiting.  All other systems reviewed and are negative.    Allergies  Codeine  Home Medications   Current Outpatient Rx  Name  Route  Sig  Dispense  Refill  . acidophilus (RISAQUAD) CAPS   Oral   Take 1 capsule by mouth daily. [facility] probiotics         . albuterol (PROVENTIL HFA;VENTOLIN HFA) 108 (90 BASE) MCG/ACT inhaler   Inhalation   Inhale 1-2 puffs into the lungs every 6 (six) hours as needed for wheezing or shortness of breath.         Marland Kitchen  amLODipine (NORVASC) 5 MG tablet   Oral   Take 5 mg by mouth 2 (two) times daily.         Marland Kitchen aspirin 81 MG tablet   Oral   Take 81 mg by mouth daily.          . bisoprolol (ZEBETA) 10 MG tablet   Oral   Take 10 mg by mouth daily.         . budesonide (PULMICORT) 0.25 MG/2ML nebulizer solution   Nebulization   Take 0.25 mg by nebulization 2 (two) times daily as needed. For asthma         . calcium-vitamin D (OSCAL WITH D) 500-200 MG-UNIT per tablet   Oral   Take 1 tablet by mouth 2 (two) times daily.         . cetaphil (CETAPHIL) lotion   Topical   Apply 1 application topically as needed for dry skin.         . cloNIDine (CATAPRES) 0.3 MG tablet   Oral   Take 0.3-0.6 mg by mouth 2 (two) times daily. Takes 0.3mg  in the morning and  0.6mg  at night         . clopidogrel (PLAVIX) 75 MG tablet   Oral   Take 75 mg by mouth daily.         Marland Kitchen denosumab (PROLIA) 60 MG/ML SOLN injection   Subcutaneous   Inject 60 mg into the skin every 6 (six) months. Administer in upper arm, thigh, or abdomen   1 mL   5   . famotidine (PEPCID) 20 MG tablet   Oral   Take 20 mg by mouth at bedtime.         . furosemide (LASIX) 80 MG tablet   Oral   Take 80 mg by mouth daily.         Marland Kitchen ipratropium-albuterol (DUONEB) 0.5-2.5 (3) MG/3ML SOLN   Nebulization   Take 3 mLs by nebulization every 6 (six) hours as needed (for shortness of breath).         . latanoprost (XALATAN) 0.005 % ophthalmic solution   Both Eyes   Place 1 drop into both eyes at bedtime.          Marland Kitchen loperamide (IMODIUM A-D) 2 MG tablet   Oral   Take 2 mg by mouth as needed. For loose stool         . LORazepam (ATIVAN) 0.5 MG tablet   Oral   Take 0.5 mg by mouth 2 (two) times daily.         Marland Kitchen losartan (COZAAR) 50 MG tablet   Oral   Take 50 mg by mouth 2 (two) times daily.         . modafinil (PROVIGIL) 200 MG tablet   Oral   Take 200 mg by mouth daily as needed (when driving long distances).          . mometasone-formoterol (DULERA) 100-5 MCG/ACT AERO   Inhalation   Inhale 2 puffs into the lungs 2 (two) times daily.         . montelukast (SINGULAIR) 10 MG tablet   Oral   Take 10 mg by mouth at bedtime.         . nitroGLYCERIN (NITROSTAT) 0.4 MG SL tablet   Sublingual   Place 0.4 mg under the tongue every 5 (five) minutes as needed for chest pain.         . potassium chloride SA (K-DUR,KLOR-CON) 20 MEQ  tablet   Oral   Take 20 mEq by mouth 2 (two) times daily.         . pravastatin (PRAVACHOL) 80 MG tablet   Oral   Take 80 mg by mouth daily.          BP 160/85  Pulse 130  Temp(Src) 97.3 F (36.3 C) (Oral)  Resp 20  Ht 5\' 2"  (1.575 m)  Wt 162 lb (73.483 kg)  BMI 29.62 kg/m2  SpO2 96% Physical Exam  Nursing note  and vitals reviewed. Constitutional: He is oriented to person, place, and time. He appears well-developed and well-nourished. No distress.  HENT:  Head: Normocephalic and atraumatic.  Mouth/Throat: No oropharyngeal exudate.  Eyes: EOM are normal. Pupils are equal, round, and reactive to light.  Neck: Normal range of motion. Neck supple.  Cardiovascular: Regular rhythm.  Tachycardia present.  Exam reveals no friction rub.   No murmur heard. Pulmonary/Chest: Effort normal and breath sounds normal. No respiratory distress. He has no wheezes. He has no rales.  Abdominal: He exhibits no distension. There is no tenderness. There is no rebound.  Musculoskeletal: Normal range of motion. He exhibits no edema.  Neurological: He is alert and oriented to person, place, and time.  Skin: He is not diaphoretic.    ED Course  Procedures (including critical care time) Labs Review Labs Reviewed  BASIC METABOLIC PANEL - Abnormal; Notable for the following:    Glucose, Bld 118 (*)    BUN 27 (*)    GFR calc non Af Amer 59 (*)    GFR calc Af Amer 69 (*)    All other components within normal limits  D-DIMER, QUANTITATIVE - Abnormal; Notable for the following:    D-Dimer, Quant 0.83 (*)    All other components within normal limits  CBC  PRO B NATRIURETIC PEPTIDE  TSH  URINE RAPID DRUG SCREEN (HOSP PERFORMED)  POCT I-STAT TROPONIN I   Imaging Review Dg Chest Port 1 View  03/08/2013   CLINICAL DATA:  Hypertension  EXAM: PORTABLE CHEST - 1 VIEW  COMPARISON:  08/26/2012  FINDINGS: Normal heart size. Clear lungs. No pneumothorax. No pleural effusion. No pulmonary edema.  IMPRESSION: No active disease.   Electronically Signed   By: Maryclare Bean M.D.   On: 03/08/2013 20:52    EKG Interpretation    Date/Time:  Saturday March 08 2013 20:12:06 EST Ventricular Rate:  131 PR Interval:  138 QRS Duration: 92 QT Interval:  310 QTC Calculation: 457 R Axis:   5 Text Interpretation:  Sinus tachycardia with  Premature supraventricular complexes Incomplete right bundle branch block Minimal voltage criteria for LVH, may be normal variant Septal infarct , age undetermined Abnormal ECG Confirmed by Gwendolyn Grant  MD, Keilah Lemire ([id number]) on 03/08/2013 8:21:21 PM            MDM   1. Paroxysmal a-fib   2. Bruit of left carotid artery   3. HTN (hypertension)    79 year old male presents with tachycardia. Tachycardia has been present yesterday. He denies any chest pain, shortness of breath, dyspnea on exertion. He states some intermittent chills, nausea, vomiting. Has history of SVT from his prior EKGs. He states no recent caffeine use. He states no withdrawal from medications. He has recently increased his lasix to twice the normal dose he was taking over the past week because of increased leg swelling. He has no history of CHF. Here vitals showed tachycardia in the 120s 130s. Sinus on EKG. Sinus on the  monitor. He otherwise is relaxing comfortably and in no acute distress. Labs normal. Despite fluids, patient's HR continues to be elevated. Patient began vomiting and his heart rate increased to 160s. It is concerning for possible SVT and dizziness bulbar not given as he was falling down and had intermittent heart rate jumped from the 130s to the 140s. There is irregular on the monitor. Repeat EKG shows A. fib versus sinus tach at 130s. Diltiazem given with some controlled his heart rate. Medicine suggested d-dimer for possible PE. Diltiazem initiated. Cardiology consult and will see patient. Medicine admitting.    Dagmar Hait, MD 03/09/13 [phone]

## 2013-03-09 NOTE — Progress Notes (Signed)
ANTICOAGULATION CONSULT NOTE - Follow Up Consult  Pharmacy Consult for Heparin Indication: atrial fibrillation  Allergies  Allergen Reactions  . Codeine Nausea And Vomiting    Patient Measurements: Height: 5\' 2"  (157.5 cm) Weight: 156 lb 9.6 oz (71.033 kg) (scale A) IBW/kg (Calculated) : 54.6  Vital Signs: Temp: 97.8 F (36.6 C) (11/30 0459) Temp src: Oral (11/30 0459) BP: 161/83 mmHg (11/30 0551) Pulse Rate: 120 (11/30 0551)  Labs:  Recent Labs  03/08/13 2022 03/09/13 0250 03/09/13 0945 03/09/13 0947  HGB 14.2  --   --   --   HCT 42.6  --   --   --   PLT 210  --   --   --   HEPARINUNFRC  --   --   --  0.23*  CREATININE 1.13  --   --   --   TROPONINI  --  <0.30 <0.30  --     Estimated Creatinine Clearance: 45.1 ml/min (by C-G formula based on Cr of 1.13).   Medications:  Infusions:  . dextrose 5 % and 0.9% NaCl 50 mL/hr at 03/09/13 0209  . diltiazem (CARDIZEM) infusion 5 mg/hr (03/09/13 0951)  . heparin 1,000 Units/hr (03/09/13 4540)    Assessment: 79 year old male admitted with tachycardia.  He is currently on anticoagulation with Heparin for atrial fibrillation.  His initial heparin level is slightly subtherapeutic.  Goal of Therapy:  Heparin level 0.3-0.7 units/ml Monitor platelets by anticoagulation protocol: Yes   Plan:  Increase Heparin to 1150 units/hr Check Heparin level in 8 hours  Estella Husk, Pharm.D., BCPS, AAHIVP Clinical Pharmacist Phone: [phone] or [phone] 03/09/2013, 11:51 AM

## 2013-03-09 NOTE — Progress Notes (Signed)
ANTICOAGULATION CONSULT NOTE - Follow Up Consult  Pharmacy Consult for Heparin Indication: atrial fibrillation  Allergies  Allergen Reactions  . Codeine Nausea And Vomiting    Patient Measurements: Height: 5\' 2"  (157.5 cm) Weight: 156 lb 9.6 oz (71.033 kg) (scale A) IBW/kg (Calculated) : 54.6  Vital Signs: Temp: 98.8 F (37.1 C) (11/30 1900) Temp src: Oral (11/30 1900) BP: 149/72 mmHg (11/30 1900) Pulse Rate: 70 (11/30 1900)  Labs:  Recent Labs  03/08/13 2022 03/09/13 0250 03/09/13 0945 03/09/13 0947 03/09/13 1943  HGB 14.2  --   --   --   --   HCT 42.6  --   --   --   --   PLT 210  --   --   --   --   HEPARINUNFRC  --   --   --  0.23* 0.38  CREATININE 1.13  --   --   --   --   TROPONINI  --  <0.30 <0.30  --   --     Estimated Creatinine Clearance: 45.1 ml/min (by C-G formula based on Cr of 1.13).   Medications:  Infusions:  . dextrose 5 % and 0.9% NaCl 50 mL/hr at 03/09/13 0209  . diltiazem (CARDIZEM) infusion 5 mg/hr (03/09/13 0951)  . heparin 1,150 Units/hr (03/09/13 1211)    Assessment: 79 year old male admitted with tachycardia.  He is currently on anticoagulation with Heparin for atrial fibrillation.  His heparin level is therapeutic after rate adjustment.  Goal of Therapy:  Heparin level 0.3-0.7 units/ml Monitor platelets by anticoagulation protocol: Yes   Plan:  Continue Heparin at 1150 units/hr Daily Heparin level and CBC  Estella Husk, Pharm.D., BCPS, AAHIVP Clinical Pharmacist Phone: [phone] or [phone] 03/09/2013, 8:37 PM

## 2013-03-09 NOTE — Progress Notes (Signed)
Patient seen and examined; presented with palpitations and ? Atrial fibrillation on telemetry in ER (no strips available; ECG shows sinus tach). Telemetry reviewed on floor and difficult to interpret due to baseline artifact; there are strips with HR 145-150 that is irregular and may be atrial fibrillation; continue cardizem and heparin; follow on telemetry; if afib demonstrated, would need anticoagulation; check echo. Olga Millers

## 2013-03-09 NOTE — Progress Notes (Signed)
  Echocardiogram 2D Echocardiogram has been performed.  [name] 03/09/2013, 12:37 PM

## 2013-03-09 NOTE — Progress Notes (Signed)
Patient admitted after midnight.  Chart reviewed. Patient examined. No CP or dyspnea.  Mainly c/o foot pain and leg swelling (which has improved). Trace edema.  Tinea pedis bilaterally.  NSR on Tele Echo results:  Left ventricle: The cavity size was normal. Wall thickness was increased in a pattern of mild LVH. Systolic function was normal. The estimated ejection fraction was in the range of 55% to 60%. - Aortic valve: Trivial regurgitation. - Mitral valve: Calcified annulus. Mildly thickened leaflets . - Left atrium: The atrium was mildly dilated. - Right ventricle: The cavity size was mildly dilated. - Right atrium: The atrium was mildly dilated. - Atrial septum: No defect or patent foramen ovale was identified. - Pulmonary arteries: PA peak pressure: 41mm Hg (S).  Had doppler legs a month ago.   Will get CTA chest as D dimer slightly elevated. R/o arrhythmia from PE.  Ketoconazole cream to feet. TSH pending.  Check b12, folate to evaluate for neuropathy, but likely just pain from tinea.  Appreciate cardiology  Crista Curb, M.D. [facility] [phone]

## 2013-03-09 NOTE — Consult Note (Signed)
Reason for Consult:Tachcardia  Referring Physician: Dr. Edison Pace. is an 79 y.o. male who is currently seen in consultation with Dr. Conley Rolls for evaluation of tachycardia  HPI: He has a PMH of CAD s/p CABG x 4, Asthma, sleep apnea and a remote history of prior AFIB (which patient does not remember).  He was in his usual state of health until 2 days prior to presentation when he started to experience nausea, vomiting for 2 days and bilateral lower extremity edema.  He took Lasix 80 mg by mouth twice and subsequently developed palpitations. He denies any chest pain, shortness of breath, dizziness or syncope.  In the ER, he was found to be tachycardic. He was reported to have a brief episode of atrial fibrillation with rapid ventricular response rate. Based of these findings, he was started Cardizem and anti-coagulated with Heparin.  His D-dimers was 0.83 which is higher than upper limits of normal (<0.48), but he has not had a CT angiogram of the chest.  A review of his EKG and telemetry strips shows that he is in sinus tachycardia. He has been hemodynamically stable.   CATH 06/27/2010 [facility]-->LAD patent SVG-->DX occluded SVG-->PDA with 80-90% stenosis s/p PCI SVG-->OM patent  TTE 10/29/2007 LVEF 55-65% RV mild to mod dilated decreased RV systolic function Past Medical History  Diagnosis Date  . CORONARY HEART DISEASE     a. s/p CABG;  b.  cath 06/27/10: S-Dx occluded, S-PDA 80-90% (tx with PCI); S-OM ok, L-LAD ok;  EF 65-70%  c.  s/p Promus DES to S-PDA 06/2010;   d. Myoview 8/12: low risk  . FIBRILLATION, ATRIAL     post op; ?documented during hosp. 06/2010  . SLEEP APNEA 09/2001    NPSG AHI 22/HR  . ALLERGIC RHINITIS   . ASTHMA   . Depression   . GERD     with HH, hx esophageal stricture  . DYSLIPIDEMIA   . HYPERTENSION     Echo 3/12: EF 55-60%; mod LVH; mild AS/AI; LAE; PASP 38; mild pulmo HTN  . Personal history of alcoholism   . Diverticulosis   . Benign liver cyst   . Skin  cancer     L forearm  . Cataract     surgery to both eyes  . Esophageal stricture   . Hiatal hernia     Past Surgical History  Procedure Laterality Date  . Coronary artery bypass graft  02/2007  . Hernia repair  12/03/07  . Tonsillectomy    . Coronary angioplasty with stent placement  07/2010  . Cataract extraction, bilateral  2007  . Hernia repair  unsure ?60's  . Cholecystectomy  02/21/2011    Procedure: LAPAROSCOPIC CHOLECYSTECTOMY WITH INTRAOPERATIVE CHOLANGIOGRAM;  Surgeon: Valarie Merino, MD;  Location: [facility];  Service: General;  Laterality: N/A;    Family History  Problem Relation Age of Onset  . COPD Sister   . Asthma Sister   . Emphysema Sister   . Hypertension Sister   . Hypertension Mother   . Colon cancer Neg Hx   . Heart disease Brother   . Heart disease Mother   . Heart disease Father     Social History:  reports that he quit smoking about 33 years ago. His smoking use included Cigarettes. He has a 120 pack-year smoking history. He has never used smokeless tobacco. He reports that he does not drink alcohol or use illicit drugs.  Allergies:  Allergies  Allergen Reactions  .  Codeine Nausea And Vomiting    Medications: Noted  Results for orders placed during the hospital encounter of 03/08/13 (from the past 48 hour(s))  CBC     Status: None   Collection Time    03/08/13  8:22 PM      Result Value Range   WBC 8.7  4.0 - 10.5 K/uL   RBC 5.02  4.22 - 5.81 MIL/uL   Hemoglobin 14.2  13.0 - 17.0 g/dL   HCT 29.5  62.1 - 30.8 %   MCV 84.9  78.0 - 100.0 fL   MCH 28.3  26.0 - 34.0 pg   MCHC 33.3  30.0 - 36.0 g/dL   RDW 65.7  84.6 - 96.2 %   Platelets 210  150 - 400 K/uL  BASIC METABOLIC PANEL     Status: Abnormal   Collection Time    03/08/13  8:22 PM      Result Value Range   Sodium 139  135 - 145 mEq/L   Potassium 3.6  3.5 - 5.1 mEq/L   Chloride 99  96 - 112 mEq/L   CO2 28  19 - 32 mEq/L   Glucose, Bld 118 (*) 70 - 99 mg/dL   BUN 27 (*) 6 - 23  mg/dL   Creatinine, Ser 9.52  0.50 - 1.35 mg/dL   Calcium 9.4  8.4 - 84.1 mg/dL   GFR calc non Af Amer 59 (*) >90 mL/min   GFR calc Af Amer 69 (*) >90 mL/min   Comment: (NOTE)     The eGFR has been calculated using the CKD EPI equation.     This calculation has not been validated in all clinical situations.     eGFR's persistently <90 mL/min signify possible Chronic Kidney     Disease.  PRO B NATRIURETIC PEPTIDE     Status: None   Collection Time    03/08/13  8:23 PM      Result Value Range   Pro B Natriuretic peptide (BNP) 236.4  0 - 450 pg/mL  POCT I-STAT TROPONIN I     Status: None   Collection Time    03/08/13  8:30 PM      Result Value Range   Troponin i, poc 0.00  0.00 - 0.08 ng/mL   Comment 3            Comment: Due to the release kinetics of cTnI,     a negative result within the first hours     of the onset of symptoms does not rule out     myocardial infarction with certainty.     If myocardial infarction is still suspected,     repeat the test at appropriate intervals.  D-DIMER, QUANTITATIVE     Status: Abnormal   Collection Time    03/08/13 11:04 PM      Result Value Range   D-Dimer, Quant 0.83 (*) 0.00 - 0.48 ug/mL-FEU   Comment:            AT THE INHOUSE ESTABLISHED CUTOFF     VALUE OF 0.48 ug/mL FEU,     THIS ASSAY HAS BEEN DOCUMENTED     IN THE LITERATURE TO HAVE     A SENSITIVITY AND NEGATIVE     PREDICTIVE VALUE OF AT LEAST     98 TO 99%.  THE TEST RESULT     SHOULD BE CORRELATED WITH     AN ASSESSMENT OF THE CLINICAL     PROBABILITY OF  DVT / VTE.  URINE RAPID DRUG SCREEN (HOSP PERFORMED)     Status: None   Collection Time    03/09/13 12:46 AM      Result Value Range   Opiates NONE DETECTED  NONE DETECTED   Cocaine NONE DETECTED  NONE DETECTED   Benzodiazepines NONE DETECTED  NONE DETECTED   Amphetamines NONE DETECTED  NONE DETECTED   Tetrahydrocannabinol NONE DETECTED  NONE DETECTED   Barbiturates NONE DETECTED  NONE DETECTED   Comment:             DRUG SCREEN FOR MEDICAL PURPOSES     ONLY.  IF CONFIRMATION IS NEEDED     FOR ANY PURPOSE, NOTIFY LAB     WITHIN 5 DAYS.                LOWEST DETECTABLE LIMITS     FOR URINE DRUG SCREEN     Drug Class       Cutoff (ng/mL)     Amphetamine      1000     Barbiturate      200     Benzodiazepine   200     Tricyclics       300     Opiates          300     Cocaine          300     THC              50  TROPONIN I     Status: None   Collection Time    03/09/13  2:50 AM      Result Value Range   Troponin I <0.30  <0.30 ng/mL   Comment:            Due to the release kinetics of cTnI,     a negative result within the first hours     of the onset of symptoms does not rule out     myocardial infarction with certainty.     If myocardial infarction is still suspected,     repeat the test at appropriate intervals.    Dg Chest Port 1 View  03/08/2013   CLINICAL DATA:  Hypertension  EXAM: PORTABLE CHEST - 1 VIEW  COMPARISON:  08/26/2012  FINDINGS: Normal heart size. Clear lungs. No pneumothorax. No pleural effusion. No pulmonary edema.  IMPRESSION: No active disease.   Electronically Signed   By: Maryclare Bean M.D.   On: 03/08/2013 20:52    Review of Systems  Constitutional: Positive for fever. Negative for chills, weight loss, malaise/fatigue and diaphoresis.  HENT: Negative for hearing loss and tinnitus.   Eyes: Negative for blurred vision and double vision.  Respiratory: Negative for cough, hemoptysis, sputum production, shortness of breath and wheezing.   Cardiovascular: Positive for palpitations and leg swelling. Negative for chest pain, orthopnea, claudication and PND.  Gastrointestinal: Positive for nausea and vomiting. Negative for heartburn, diarrhea and constipation.  Genitourinary: Negative for dysuria, urgency and frequency.  Musculoskeletal: Negative for back pain, myalgias and neck pain.  Skin: Negative for itching and rash.  Neurological: Negative for dizziness, tingling,  weakness and headaches.  Psychiatric/Behavioral: Negative for depression, suicidal ideas and hallucinations.   Blood pressure 171/82, pulse 121, temperature 97.8 F (36.6 C), temperature source Oral, resp. rate 17, height 5\' 2"  (1.575 m), weight 71.033 kg (156 lb 9.6 oz), SpO2 93.00%. Physical Exam  Constitutional: He is oriented to person, place, and time. He  appears well-developed and well-nourished. No distress.  HENT:  Head: Normocephalic and atraumatic.  Eyes: EOM are normal. Right eye exhibits no discharge. Left eye exhibits no discharge. No scleral icterus.  Neck: No JVD present. No tracheal deviation present.  Bilateral carotid bruit (right greater than left).  Cardiovascular: Regular rhythm and normal heart sounds.  Exam reveals no gallop and no friction rub.   Tachycardic with regular rhythm  Respiratory: Effort normal. No stridor. No respiratory distress. He has no wheezes. He has no rales. He exhibits no tenderness.  GI: Soft. Bowel sounds are normal. He exhibits no distension. There is no tenderness. There is no rebound.  Musculoskeletal: Normal range of motion. He exhibits no edema and no tenderness.  Neurological: He is alert and oriented to person, place, and time. No cranial nerve deficit. Coordination normal.  Skin: No rash noted. He is not diaphoretic. No erythema.  Psychiatric: He has a normal mood and affect.    Assessment/Plan:  Sinus tachycardia: etiology unknown, but may be related to his nausea/vomiting, excessive diuretic use, thyroid abnormalities, pulmonary embolic disease or inappropriate sinus tachycardia. No drug withdrawal (no ETOH in last 40 years). Will check orthostatic vital signs, thyroid function test, and continue to observe on telemetry to see if etiology becomes self-evident. Consider evaluation for pulmonary embolic disease.  Obtain TTE to evaluate right and left ventricular contraction.  Will follow with you.  Fulton Merry E 03/09/2013, 5:10  AM

## 2013-03-10 DIAGNOSIS — R000 Tachycardia, unspecified: Secondary | ICD-10-CM

## 2013-03-10 DIAGNOSIS — E876 Hypokalemia: Secondary | ICD-10-CM

## 2013-03-10 DIAGNOSIS — R002 Palpitations: Secondary | ICD-10-CM

## 2013-03-10 DIAGNOSIS — B353 Tinea pedis: Secondary | ICD-10-CM | POA: Diagnosis present

## 2013-03-10 LAB — COMPREHENSIVE METABOLIC PANEL
Alkaline Phosphatase: 67 U/L (ref 39–117)
BUN: 21 mg/dL (ref 6–23)
CO2: 27 mEq/L (ref 19–32)
Chloride: 107 mEq/L (ref 96–112)
Creatinine, Ser: 1.08 mg/dL (ref 0.50–1.35)
GFR calc Af Amer: 73 mL/min — ABNORMAL LOW (ref >90)
GFR calc non Af Amer: 63 mL/min — ABNORMAL LOW (ref >90)
Glucose, Bld: 146 mg/dL — ABNORMAL HIGH (ref 70–99)
Total Bilirubin: 0.2 mg/dL — ABNORMAL LOW (ref 0.3–1.2)

## 2013-03-10 LAB — CBC
HCT: 36.3 % — ABNORMAL LOW (ref 39.0–52.0)
Hemoglobin: 12.3 g/dL — ABNORMAL LOW (ref 13.0–17.0)
MCH: 29.1 pg (ref 26.0–34.0)
MCV: 85.8 fL (ref 78.0–100.0)
Platelets: 174 10*3/uL (ref 150–400)
RBC: 4.23 MIL/uL (ref 4.22–5.81)

## 2013-03-10 LAB — VITAMIN B12: Vitamin B-12: 228 pg/mL (ref 211–911)

## 2013-03-10 LAB — FOLATE RBC: RBC Folate: 734 ng/mL — ABNORMAL HIGH (ref >280)

## 2013-03-10 MED ORDER — POTASSIUM CHLORIDE CRYS ER 20 MEQ PO TBCR
40.0000 meq | EXTENDED_RELEASE_TABLET | Freq: Two times a day (BID) | ORAL | Status: DC
  Administered 2013-03-10: 40 meq via ORAL

## 2013-03-10 MED ORDER — CLONIDINE HCL 0.3 MG PO TABS: 0.3000 mg | ORAL_TABLET | Freq: Two times a day (BID) | ORAL | Status: DC

## 2013-03-10 MED ORDER — POTASSIUM CHLORIDE CRYS ER 20 MEQ PO TBCR
40.0000 meq | EXTENDED_RELEASE_TABLET | Freq: Once | ORAL | Status: AC
  Administered 2013-03-10: 40 meq via ORAL
  Filled 2013-03-10: qty 2

## 2013-03-10 MED ORDER — KETOCONAZOLE 2 % EX CREA: TOPICAL_CREAM | Freq: Two times a day (BID) | CUTANEOUS | Status: DC

## 2013-03-10 MED ORDER — DILTIAZEM HCL ER COATED BEADS 120 MG PO CP24: 120.0000 mg | ORAL_CAPSULE | Freq: Every day | ORAL | Status: DC

## 2013-03-10 MED ORDER — APIXABAN 5 MG PO TABS: 5.0000 mg | ORAL_TABLET | Freq: Two times a day (BID) | ORAL | Status: DC

## 2013-03-10 NOTE — Discharge Summary (Signed)
Physician Discharge Summary  [name]. VOZ:[id number] DOB: 1931-02-25 DOA: 03/08/2013  PCP: Rene Paci, MD  Admit date: 03/08/2013 Discharge date: 03/10/2013  Time spent: greater than 30 min  Recommendations for Outpatient Follow-up:  1. Follow up B12, folate 2. Monitor potassium  Discharge Diagnoses:  Principal Problem:   Paroxysmal a-fib Active Problems:   DYSLIPIDEMIA   CORONARY HEART DISEASE   Obstructive sleep apnea   Bruit of left carotid artery   HTN (hypertension)   Tachycardia   Palpitations   Hypokalemia   Tinea pedis   Discharge Condition:  Filed Weights   03/08/13 2012 03/09/13 0131  Weight: 73.483 kg (162 lb) 71.033 kg (156 lb 9.6 oz)    History of present illness:  78 y.o. male with hx of known CAD, s/p DES to S-PDA 2012, hx of afib on ASA and Plavix, sleep apnea with AHI 22, distant history of alcohol abuse, presents to the ER with 3 days history of palpitation without shortness of breath or chest pain. He said he felt his heart pounding for the past 3 days. There was no fever, chills, but some nausea and vomiting. He hadn't stopped any new medication nor started any new one. He has Provigil, but said he hadn't taken it for several months. In the ER, originally he had what appeared to be SVT at 170, and Adenosine was about to be given, but his rhythm changed to afib with RVR at 130. Other work up included a normal WBC, Hb, and renal fx tests. His CXR was clear and one EKG showed ST, while a repeated EKG showed afib with RVR. He was then given IV Cardiazem and then a drip. His rate slowed to about 110. He felt better, and hositalist was asked to admit him for further Tx.   Hospital Course:  Admitted to hospitalists.  In addition to cardizem gtt, started on heparin gtt. Cardiology consulted and recommended Eliquis.  Stop asa and plavix.  Transitioned to oral cardizem.  No recurrence of a fib. Amlodipine stopped. Hypokalemia repleted  Patient  complained mainly of leg edema PTA which had largely resolved and foot pain.  Did have tinea pedis. Antifungal started. B12, folate pending to r/o neuropathy. TSH ok.    Procedures:  none  Consultations:  cardiology  Discharge Exam: Filed Vitals:   03/10/13 0437  BP: 103/57  Pulse: 64  Temp: 97.6 F (36.4 C)  Resp: 19    General: comfortable Cardiovascular: RRR Respiratory: cta Ext no cce  Discharge Instructions  Discharge Orders   Future Appointments Provider Department Dept Phone   03/12/2013 9:30 AM Waymon Budge, MD [facility] [phone]   03/25/2013 8:50 AM Beatrice Lecher, PA-C [facility] [place] [facility] [phone]   03/28/2013 1:00 PM Newt Lukes, MD [facility] [phone]   04/17/2013 2:45 PM Rollene Rotunda, MD [facility] [place] [facility] [phone]   04/18/2013 11:00 AM Newt Lukes, MD [facility] [phone]   Future Orders Complete By Expires   Diet - low sodium heart healthy  As directed    Increase activity slowly  As directed        Medication List    STOP taking these medications       amLODipine 5 MG tablet  Commonly known as:  NORVASC     aspirin 81 MG tablet     clopidogrel 75 MG tablet  Commonly known as:  PLAVIX     modafinil  200 MG tablet  Commonly known as:  PROVIGIL      TAKE these medications       acidophilus Caps capsule  Take 1 capsule by mouth daily. [facility] probiotics     albuterol 108 (90 BASE) MCG/ACT inhaler  Commonly known as:  PROVENTIL HFA;VENTOLIN HFA  Inhale 1-2 puffs into the lungs every 6 (six) hours as needed for wheezing or shortness of breath.     apixaban 5 MG Tabs tablet  Commonly known as:  ELIQUIS  Take 1 tablet (5 mg total) by mouth 2 (two) times daily.     bisoprolol 10 MG tablet  Commonly known as:  ZEBETA  Take 10 mg by mouth daily.     budesonide 0.25 MG/2ML nebulizer solution  Commonly known as:   PULMICORT  Take 0.25 mg by nebulization 2 (two) times daily as needed. For asthma     calcium-vitamin D 500-200 MG-UNIT per tablet  Commonly known as:  OSCAL WITH D  Take 1 tablet by mouth 2 (two) times daily.     cetaphil lotion  Apply 1 application topically as needed for dry skin.     cloNIDine 0.3 MG tablet  Commonly known as:  CATAPRES  Take 1 tablet (0.3 mg total) by mouth 2 (two) times daily. Takes 0.3mg  in the morning and 0.6mg  at night     denosumab 60 MG/ML Soln injection  Commonly known as:  PROLIA  Inject 60 mg into the skin every 6 (six) months. Administer in upper arm, thigh, or abdomen     diltiazem 120 MG 24 hr capsule  Commonly known as:  CARDIZEM CD  Take 1 capsule (120 mg total) by mouth daily.     DUONEB 0.5-2.5 (3) MG/3ML Soln  Generic drug:  ipratropium-albuterol  Take 3 mLs by nebulization every 6 (six) hours as needed (for shortness of breath).     famotidine 20 MG tablet  Commonly known as:  PEPCID  Take 20 mg by mouth at bedtime.     furosemide 80 MG tablet  Commonly known as:  LASIX  Take 80 mg by mouth daily.     ketoconazole 2 % cream  Commonly known as:  NIZORAL  Apply topically 2 (two) times daily. To soles of feet and between toes     latanoprost 0.005 % ophthalmic solution  Commonly known as:  XALATAN  Place 1 drop into both eyes at bedtime.     loperamide 2 MG tablet  Commonly known as:  IMODIUM A-D  Take 2 mg by mouth as needed. For loose stool     LORazepam 0.5 MG tablet  Commonly known as:  ATIVAN  Take 0.5 mg by mouth 2 (two) times daily.     losartan 50 MG tablet  Commonly known as:  COZAAR  Take 50 mg by mouth 2 (two) times daily.     mometasone-formoterol 100-5 MCG/ACT Aero  Commonly known as:  DULERA  Inhale 2 puffs into the lungs 2 (two) times daily.     montelukast 10 MG tablet  Commonly known as:  SINGULAIR  Take 10 mg by mouth at bedtime.     nitroGLYCERIN 0.4 MG SL tablet  Commonly known as:  NITROSTAT   Place 0.4 mg under the tongue every 5 (five) minutes as needed for chest pain.     potassium chloride SA 20 MEQ tablet  Commonly known as:  K-DUR,KLOR-CON  Take 20 mEq by mouth 2 (two) times daily.     pravastatin  80 MG tablet  Commonly known as:  PRAVACHOL  Take 80 mg by mouth daily.       Allergies  Allergen Reactions  . Codeine Nausea And Vomiting       Follow-up Information   Follow up with Tereso Newcomer, PA-C. (03/25/13 at 8:50am)    Specialty:  Physician Assistant   Contact information:   [address] [place] [address] [phone]        The results of significant diagnostics from this hospitalization (including imaging, microbiology, ancillary and laboratory) are listed below for reference.    Significant Diagnostic Studies: Ct Angio Chest Pe W/cm &/or Wo Cm  03/09/2013   CLINICAL DATA:  Palpitations.  Elevated D-dimer.  EXAM: CT ANGIOGRAPHY CHEST WITH CONTRAST  TECHNIQUE: Multidetector CT imaging of the chest was performed using the standard protocol during bolus administration of intravenous contrast. Multiplanar CT image reconstructions including MIPs were obtained to evaluate the vascular anatomy.  CONTRAST:  80mL OMNIPAQUE IOHEXOL 350 MG/ML SOLN  COMPARISON:  Chest x-ray 03/08/2013.  Chest CT 06/25/2010.  FINDINGS: No evidence of pulmonary embolus. Cardiomegaly. Coronary artery disease. Prior CABG. No pericardial effusion.  Large airways are patent. The lungs are clear of acute infiltrates. No focal pulmonary mass lesion noted.  Chest wall is intact. No acute bony abnormality identified. Visualized upper abdominal organs are unremarkable. Simple cyst again noted in the liver.  Review of the MIP images confirms the above findings.  IMPRESSION: 1. Prior CABG.  Cardiomegaly.  No evidence congestive heart failure. 2. No pulmonary embolus.   Electronically Signed   By: Maisie Fus  Register   On: 03/09/2013 15:56   Dg Chest Port 1 View  03/08/2013    CLINICAL DATA:  Hypertension  EXAM: PORTABLE CHEST - 1 VIEW  COMPARISON:  08/26/2012  FINDINGS: Normal heart size. Clear lungs. No pneumothorax. No pleural effusion. No pulmonary edema.  IMPRESSION: No active disease.   Electronically Signed   By: Maryclare Bean M.D.   On: 03/08/2013 20:52   EKG Sinus tachycardia with Premature supraventricular complexes Incomplete right bundle branch block Minimal voltage criteria for LVH, may be normal variant Septal infarct , age undetermined  Echo Left ventricle: The cavity size was normal. Wall thickness was increased in a pattern of mild LVH. Systolic function was normal. The estimated ejection fraction was in the range of 55% to 60%. - Aortic valve: Trivial regurgitation. - Mitral valve: Calcified annulus. Mildly thickened leaflets . - Left atrium: The atrium was mildly dilated. - Right ventricle: The cavity size was mildly dilated. - Right atrium: The atrium was mildly dilated. - Atrial septum: No defect or patent foramen ovale was identified. - Pulmonary arteries: PA peak pressure: 41mm Hg (S).  Microbiology: No results found for this or any previous visit (from the past 240 hour(s)).   Labs: Basic Metabolic Panel:  Recent Labs Lab 03/08/13 2022 03/10/13 0516  NA 139 144  K 3.6 3.1*  CL 99 107  CO2 28 27  GLUCOSE 118* 146*  BUN 27* 21  CREATININE 1.13 1.08  CALCIUM 9.4 8.6  MG  --  2.1   Liver Function Tests:  Recent Labs Lab 03/10/13 0516  AST 14  ALT 11  ALKPHOS 67  BILITOT 0.2*  PROT 6.0  ALBUMIN 3.3*   No results found for this basename: LIPASE, AMYLASE,  in the last 168 hours No results found for this basename: AMMONIA,  in the last 168 hours CBC:  Recent Labs Lab 03/08/13  2022 03/10/13 0516  WBC 8.7 5.1  HGB 14.2 12.3*  HCT 42.6 36.3*  MCV 84.9 85.8  PLT 210 174   Cardiac Enzymes:  Recent Labs Lab 03/09/13 0250 03/09/13 0945 03/10/13 0516  CKTOTAL  --   --  39  TROPONINI <0.30 <0.30  --     BNP: BNP (last 3 results)  Recent Labs  03/08/13 2023  PROBNP 236.4   CBG: No results found for this basename: GLUCAP,  in the last 168 hours Signed:  Kingslee Mairena L  [facility] 03/10/2013, 11:50 AM

## 2013-03-10 NOTE — Progress Notes (Signed)
ANTICOAGULATION CONSULT NOTE - Follow Up Consult  Pharmacy Consult for Heparin Indication: atrial fibrillation  Allergies  Allergen Reactions  . Codeine Nausea And Vomiting    Patient Measurements: Height: 5\' 2"  (157.5 cm) Weight: 156 lb 9.6 oz (71.033 kg) (scale A) IBW/kg (Calculated) : 54.6  Vital Signs: Temp: 97.6 F (36.4 C) (12/01 0437) Temp src: Oral (12/01 0437) BP: 103/57 mmHg (12/01 0437) Pulse Rate: 64 (12/01 0437)  Labs:  Recent Labs  03/08/13 2022 03/09/13 0250 03/09/13 0945 03/09/13 0947 03/09/13 1943 03/10/13 0516  HGB 14.2  --   --   --   --  12.3*  HCT 42.6  --   --   --   --  36.3*  PLT 210  --   --   --   --  174  HEPARINUNFRC  --   --   --  0.23* 0.38 0.54  CREATININE 1.13  --   --   --   --  1.08  CKTOTAL  --   --   --   --   --  39  TROPONINI  --  <0.30 <0.30  --   --   --     Estimated Creatinine Clearance: 47.2 ml/min (by C-G formula based on Cr of 1.08).   Medications:  Infusions:  . dextrose 5 % and 0.9% NaCl 50 mL/hr (03/09/13 2131)  . heparin 1,150 Units/hr (03/09/13 1211)    Assessment: 79 year old male admitted with tachycardia.  He is currently on anticoagulation with Heparin for atrial fibrillation.  His heparin level remains therapeutic this AM. CBC is stable, no bleeding noted.   Goal of Therapy:  Heparin level 0.3-0.7 units/ml Monitor platelets by anticoagulation protocol: Yes   Plan:  1. Continue heparin gtt at 1150 units/hr 2. Continue daily heparin level and CBC 3. F/u plans for transition to oral [facility]  Lysle Pearl, PharmD, BCPS Pager # [phone] 03/10/2013 10:33 AM

## 2013-03-10 NOTE — Care Management Note (Unsigned)
    Page 1 of 1   03/10/2013     11:00:53 AM   CARE MANAGEMENT NOTE 03/10/2013  Patient:  [name], [name]   Account Number:  [id number]  Date Initiated:  03/10/2013  Documentation initiated by:  Holm,HENRIETTA  Subjective/Objective Assessment:   adm with dx of AFib w/RVR; lives with spouse    PCP  Dr Felicity Coyer     Action/Plan:   Anticipated DC Date:     Anticipated DC Plan:        DC Planning Services  CM consult      Choice offered to / List presented to:             Status of service:   Medicare Important Message given?   (If response is "NO", the following Medicare IM given date fields will be blank) Date Medicare IM given:   Date Additional Medicare IM given:    Discharge Disposition:    Per UR Regulation:  Reviewed for med. necessity/level of care/duration of stay  If discussed at Long Length of Stay Meetings, dates discussed:    Comments:  03/10/13 Brion Hedges,RN,BSN [phone] CM CONSULT TO CHECK COVERAGE FOR ELIQUIS/XARELTO:  xarelto, tier 2 medication, $28.00 if patient is in initial coverage, Berkley Harvey is required ph [phone]  eliquis, tier 2 medication, $28.00 if patient is in initial coverage, Berkley Harvey is required ph [phone]   03/10/13 0818 Henrietta Arterberry RN MSN BSN CCM CMA to determine copay for Xarelto vs Eliquis.

## 2013-03-10 NOTE — Progress Notes (Signed)
Patient Name: [name]. Date of Encounter: 03/10/2013   Principal Problem:   Paroxysmal a-fib Active Problems:   CORONARY HEART DISEASE   HTN (hypertension)   DYSLIPIDEMIA   Obstructive sleep apnea   Bruit of left carotid artery   Tachycardia   Palpitations   SUBJECTIVE  No chest pain, sob, or palpitations. Maintaining sinus-sinus brady w/ 1st deg avb on tele.  CURRENT MEDS . acidophilus  1 capsule Oral Daily  . aspirin EC  81 mg Oral Daily  . atorvastatin  20 mg Oral q1800  . bisoprolol  10 mg Oral Daily  . budesonide  0.25 mg Nebulization Daily  . calcium-vitamin D  1 tablet Oral BID  . cloNIDine  0.3 mg Oral Daily  . cloNIDine  0.6 mg Oral QHS  . clopidogrel  75 mg Oral Q breakfast  . famotidine  20 mg Oral QHS  . furosemide  80 mg Oral Daily  . ketoconazole   Topical BID  . latanoprost  1 drop Both Eyes QHS  . LORazepam  0.5 mg Oral BID  . losartan  50 mg Oral BID  . mometasone-formoterol  2 puff Inhalation BID  . montelukast  10 mg Oral QHS  . potassium chloride SA  20 mEq Oral BID  . sodium chloride  3 mL Intravenous Q12H    OBJECTIVE  Filed Vitals:   03/09/13 1351 03/09/13 1900 03/09/13 2026 03/10/13 0437  BP: 144/65 149/72  103/57  Pulse: 71 70  64  Temp: 97.8 F (36.6 C) 98.8 F (37.1 C)  97.6 F (36.4 C)  TempSrc: Oral Oral  Oral  Resp: 18 19  19   Height:      Weight:      SpO2: 95% 97% 97% 98%    Intake/Output Summary (Last 24 hours) at 03/10/13 0655 Last data filed at 03/10/13 0500  Gross per 24 hour  Intake 2346.67 ml  Output    500 ml  Net 1846.67 ml   Filed Weights   03/08/13 2012 03/09/13 0131  Weight: 162 lb (73.483 kg) 156 lb 9.6 oz (71.033 kg)    PHYSICAL EXAM  General: Pleasant, NAD. Neuro: Alert and oriented X 3. Moves all extremities spontaneously. Psych: Normal affect. HEENT:  Normal  Neck: Supple without jvd.  L carotid bruit. Lungs:  Resp regular and unlabored, CTA. Heart: RRR no s3, s4, 2/6 sem loudest @  rusb. Abdomen: Soft, non-tender, non-distended, BS + x 4.  Extremities: No clubbing, cyanosis or edema. DP/PT/Radials 2+ and equal bilaterally.  Accessory Clinical Findings  CBC  Recent Labs  03/08/13 2022 03/10/13 0516  WBC 8.7 5.1  HGB 14.2 12.3*  HCT 42.6 36.3*  MCV 84.9 85.8  PLT 210 174   Basic Metabolic Panel  Recent Labs  03/08/13 2022 03/10/13 0516  NA 139 144  K 3.6 3.1*  CL 99 107  CO2 28 27  GLUCOSE 118* 146*  BUN 27* 21  CREATININE 1.13 1.08  CALCIUM 9.4 8.6  MG  --  2.1   Liver Function Tests  Recent Labs  03/10/13 0516  AST 14  ALT 11  ALKPHOS 67  BILITOT 0.2*  PROT 6.0  ALBUMIN 3.3*   Cardiac Enzymes  Recent Labs  03/09/13 0250 03/09/13 0945 03/10/13 0516  CKTOTAL  --   --  39  TROPONINI <0.30 <0.30  --    D-Dimer  Recent Labs  03/08/13 2304  DDIMER 0.83*   Thyroid Function Tests  Recent Labs  03/09/13 0945  TSH 1.170   TELE  Sinus rhythm/sinus brady 50's - 60's, first deg avb, occasional mobitz 1.  2D Echocardiogram 11.20.2014  Study Conclusions  - Left ventricle: The cavity size was normal. Wall thickness was increased in a pattern of mild LVH. Systolic function was normal. The estimated ejection fraction was in the range of 55% to 60%. - Aortic valve: Trivial regurgitation. - Mitral valve: Calcified annulus. Mildly thickened leaflets . - Left atrium: The atrium was mildly dilated. - Right ventricle: The cavity size was mildly dilated. - Right atrium: The atrium was mildly dilated. - Atrial septum: No defect or patent foramen ovale was identified. - Pulmonary arteries: PA peak pressure: 41mm Hg (S). _____________   Radiology/Studies  Ct Angio Chest Pe W/cm &/or Wo Cm  03/09/2013   CLINICAL DATA:  Palpitations.  Elevated D-dimer.  EXAM: CT ANGIOGRAPHY CHEST WITH CONTRAST    IMPRESSION: 1. Prior CABG.  Cardiomegaly.  No evidence congestive heart failure. 2. No pulmonary embolus.   Electronically Signed    By: Maisie Fus  Register   On: 03/09/2013 15:56   Dg Chest Port 1 View  03/08/2013   CLINICAL DATA:  Hypertension  EXAM: PORTABLE CHEST - 1 VIEW IMPRESSION: No active disease.   Electronically Signed   By: Maryclare Bean M.D.   On: 03/08/2013 20:52   ASSESSMENT AND PLAN  1.  PAF/Palpitations:  Pt presented with irregular tachycardia, which has subsequently broke.  He is now maintaining sinus rhythm w/ first deg avb, intermittent wenckebach.  IV dilt d/c'd in the setting of bradycardia.  Remains on heparin.  CHADS2 = 2, CHA2DS2VASc = 4.  Will ask case management to eval cost feasibility of rivaroxaban vs apixaban vs coumadin.  Pt is leaning towards coumadin but will consider a novel agent if it's affordable.  He does have Rx coverage.  As for rate mgmt, he is on bisoprolol 10 @ home and here in the hospital.  Will d/w Dr. Antoine Poche, consider adding dilt cd 120.  With h/o severe, chronic asthma followed by pulmonology, increasing bb to 10 bid, not ideal.  2.  CAD: no chest pain or sob.  Trop nl.  Echo showed nl LV fxn.  Cont statin, bb.  Pt is also on plavix since PCI/DES (promus element) of VG->PDA in 06/2010.  In light of consideration of adding an oral anticoagulant, we will also need to consider discontinuation of either asa or plavix.  3.  HTN:  Stable.  Multiple meds.  4.  HL:  Cont statin.  LDL 60 in July.  LFT's nl.  Cont statin.  5.  Hypokalemia:  Supp.  6.  Anemia:  Normocytic.  Mild - since admission.  May be secondary to blood draws but with addition of heparin will guaiac stools.  Signed, Nicolasa Ducking NP  History and all data above reviewed.  Patient examined.  I agree with the findings as above. The patient is at baseline and OK to go home  The patient exam reveals COR:RRR  ,  Lungs: Clear  ,  Abd: Positive bowel sounds, no rebound no guarding, Ext Trace edema  .  All available labs, radiology testing, previous records reviewed. Agree with documented assessment and plan. Atrial fib: I  have looked at all of the strips and there is evidence for atrial fib.  I would prefer Eliquis as his only anticoagulant if his insurance will cover this.  I would add Cardizem CD at discharge 120 mg.  No Norvasc.  (  This might have contributed to the edema he was describing at admission).  He would stop the Plavix and ASA.   He would continue the current dose of beta blocker and previous dose of diuretic.  However, he takes a very high dose of clonidine.  BPs have been high for the most part.  I would suggest that he continue with clonidine but use 0.3 bid.  He needs to keep a 2 x daily BP diary at home.  We will arrange follow up.     Rollene Rotunda  8:31 AM  03/10/2013

## 2013-03-10 NOTE — Progress Notes (Signed)
I have called and have received prior authorization for Eliquis 5mg  BID through [employer].  Nicolasa Ducking, NP 03/10/2013 12:40 PM

## 2013-03-12 ENCOUNTER — Ambulatory Visit (INDEPENDENT_AMBULATORY_CARE_PROVIDER_SITE_OTHER): Payer: Medicare Other | Admitting: Internal Medicine

## 2013-03-12 ENCOUNTER — Encounter: Payer: Self-pay | Admitting: Internal Medicine

## 2013-03-12 VITALS — BP 172/80 | HR 98 | Temp 98.7°F | Wt 158.0 lb

## 2013-03-12 VITALS — BP 160/84 | HR 86 | Ht 62.0 in | Wt 158.8 lb

## 2013-03-12 DIAGNOSIS — R609 Edema, unspecified: Secondary | ICD-10-CM

## 2013-03-12 DIAGNOSIS — J45998 Other asthma: Secondary | ICD-10-CM

## 2013-03-12 DIAGNOSIS — I10 Essential (primary) hypertension: Secondary | ICD-10-CM

## 2013-03-12 DIAGNOSIS — J45909 Unspecified asthma, uncomplicated: Secondary | ICD-10-CM

## 2013-03-12 DIAGNOSIS — I4891 Unspecified atrial fibrillation: Secondary | ICD-10-CM

## 2013-03-12 DIAGNOSIS — I2510 Atherosclerotic heart disease of native coronary artery without angina pectoris: Secondary | ICD-10-CM

## 2013-03-12 DIAGNOSIS — R600 Localized edema: Secondary | ICD-10-CM

## 2013-03-12 MED ORDER — DILTIAZEM HCL ER COATED BEADS 120 MG PO CP24: 120.0000 mg | ORAL_CAPSULE | Freq: Two times a day (BID) | ORAL | Status: DC

## 2013-03-12 MED ORDER — PROMETHAZINE HCL 25 MG PO TABS: 25.0000 mg | ORAL_TABLET | Freq: Four times a day (QID) | ORAL | Status: DC | PRN

## 2013-03-12 NOTE — Progress Notes (Signed)
Subjective:    Patient ID: [name]., male    DOB: Sep 08, 1930, 79 y.o.   MRN: [id number]  HPI Here for  Hospital followup Hospitalized for rapid A. Fib -hospital labs and imaging reviewed Since discharge, decreased edema but continued shortness of breath and heart rate over 100 beats per minute. Reviewed medications and questions regarding ?amlodipine vs diltiazem  Also reviewed chronic medical issues today  Severe COPD, chronic asthma, remote tobacco - follows with pulm for same - frequent flares related to weather change.  reports compliance with ongoing treatment (Dulera and Alb) but unable to afford Spiriva or Daliresp. Previously tried Duonebs spring 2013 - denies adverse side effects related to current therapy. Recent exacerbations reviewed  CAD s/p CABG 2008, DES to saph v graft 06/2010 due to abn stress test - follows with cards for same - reports compliance with ongoing medical treatment and no changes in medication dose or frequency. denies adverse side effects related to current therapy. no chest pain or anginal symptoms   HTN -exacerbation following hospitalization for rapid A. Fib November 2014 with med changes reviewed- reports compliance with ongoing medical treatment. denies adverse side effects related to current therapy. no headache or weakness -    Past Medical History  Diagnosis Date  . CORONARY HEART DISEASE     a. s/p CABG;  b.  cath 06/27/10: S-Dx occluded, S-PDA 80-90% (tx with PCI); S-OM ok, L-LAD ok;  EF 65-70%  c.  s/p Promus DES to S-PDA 06/2010;   d. Myoview 8/12: low risk  . FIBRILLATION, ATRIAL     post op; ?documented during hosp. 06/2010  . SLEEP APNEA 09/2001    NPSG AHI 22/HR  . ALLERGIC RHINITIS   . ASTHMA   . Depression   . GERD     with HH, hx esophageal stricture  . DYSLIPIDEMIA   . HYPERTENSION     Echo 3/12: EF 55-60%; mod LVH; mild AS/AI; LAE; PASP 38; mild pulmo HTN  . Personal history of alcoholism   . Diverticulosis   . Benign  liver cyst   . Skin cancer     L forearm  . Cataract     surgery to both eyes  . Esophageal stricture   . Hiatal hernia     Review of Systems  Constitutional: Positive for fatigue. Negative for fever and unexpected weight change.  Respiratory: Positive for cough (dry) and shortness of breath (chronic). Negative for wheezing.   Cardiovascular: Positive for palpitations and leg swelling (improving). Negative for chest pain.  Gastrointestinal: Positive for nausea. Negative for vomiting, abdominal pain and constipation.  Musculoskeletal: Negative for arthralgias and myalgias.  Neurological: Negative for dizziness, weakness, light-headedness and headaches.      Objective:   Physical Exam BP 172/80  Pulse 98  Temp(Src) 98.7 F (37.1 C) (Oral)  Wt 158 lb (71.668 kg)  SpO2 97% Wt Readings from Last 3 Encounters:  03/12/13 158 lb (71.668 kg)  03/12/13 158 lb 12.8 oz (72.031 kg)  03/09/13 156 lb 9.6 oz (71.033 kg)   Constitutional:  He is short and overweight, appears well-developed and well-nourished. No distress. Wife at side Cardiovascular: Irregular rate and rhythm.  No murmur heard. 1+ mild BLE edema (improved) Pulmonary/Chest: resting mild dyspnea, worse with conversation - but at baseline. No respiratory distress. Mild inspiratory and expiratory wheeze but fair air movement B.  Skin: Solar and senile changes, chronic hemochromatic and tattooed forearms. Skin is warm and dry.  No erythema or ulceration.  Psychiatric: he has a normal mood and affect. behavior is normal. Judgment and thought content normal.      Lab Results  Component Value Date   WBC 5.1 03/10/2013   HGB 12.3* 03/10/2013   HCT 36.3* 03/10/2013   PLT 174 03/10/2013   CHOL 123 10/16/2012   TRIG 111.0 10/16/2012   HDL 41.10 10/16/2012   ALT 11 03/10/2013   AST 14 03/10/2013   NA 144 03/10/2013   K 3.1* 03/10/2013   CL 107 03/10/2013   CREATININE 1.08 03/10/2013   BUN 21 03/10/2013   CO2 27 03/10/2013   TSH 1.170  03/09/2013   PSA 0.62 10/10/2010   INR 1.0 04/26/2012   HGBA1C 6.4 10/16/2012    Assessment & Plan:   See problem list. Medications and labs reviewed today.  Time spent with pt/family today 25 minutes, greater than 50% time spent counseling patient on hosp for Rapid AF and medication review. Also review of prior records

## 2013-03-12 NOTE — Patient Instructions (Addendum)
It was good to see you today.  We have reviewed your prior records including labs and tests today  Medications reviewed and updated: increase Diltiazem to 120mg  2x/day for blood pressure and rapid heart rate  No more amlodipine  Use promethazine for nausea as needed - Your prescription(s) have been submitted to your pharmacy. Please take as directed and contact our office if you believe you are having problem(s) with the medication(s).  Please schedule followup in 1-2 weeks for recheck, call sooner if problems.  if your symptoms continue to worsen (pain, fever, shortness of breath, etc), or if you are unable take anything by mouth (pills, fluids, etc), you should go to the emergency room for further evaluation and treatment.  Atrial Fibrillation Atrial fibrillation is a condition that causes your heart to beat irregularly. It may also cause your heart to beat faster than normal. Atrial fibrillation can prevent your heart from pumping blood normally. It increases your risk of stroke and heart problems. HOME CARE  Take medications as told by your doctor.  Only take medications that your doctor says are safe. Some medications can make the condition worse or happen again.  If blood thinners were prescribed by your doctor, take them exactly as told. Too much can cause bleeding. Too little and you will not have the needed protection against stroke and other problems.  Perform blood tests at home if told by your doctor.  Perform blood tests exactly as told by your doctor.  Do not drink alcohol.  Do not drink beverages with caffeine such as coffee, soda, and some teas.  Maintain a healthy weight.  Do not use diet pills unless your doctor says they are safe. They may make heart problems worse.  Follow diet instructions as told by your doctor.  Exercise regularly as told by your doctor.  Keep all follow-up appointments. GET HELP RIGHT AWAY IF:   You have chest or belly (abdominal)  pain.  You feel sick to your stomach (nauseous)  You suddenly have swollen feet and ankles.  You feel dizzy.  You face, arms, or legs feel numb or weak.  There is a change in your vision or speech.  You notice a change in the speed, rhythm, or strength of your heartbeat.  You suddenly begin peeing (urinating) more often.  You get tired more easily when moving or exercising. MAKE SURE YOU:   Understand these instructions.  Will watch your condition.  Will get help right away if you are not doing well or get worse. Document Released: 01/04/2008 Document Revised: 07/22/2012 Document Reviewed: 05/07/2012 [facility] Patient Information 2014 [place], [place].

## 2013-03-12 NOTE — Patient Instructions (Addendum)
We can continue [facility]  Ask Dr Felicity Coyer to check your medications. I don't know that you should be on both Norvasc and Cardizem, unless this was deliberate by your cardiologist.

## 2013-03-12 NOTE — Assessment & Plan Note (Signed)
[name], which provides needed maintenance controller. Hopefully the bronchodilator is not aggravating heart rate/ AFib.

## 2013-03-12 NOTE — Assessment & Plan Note (Signed)
hosp for same 02/2013 reviewed Still RVR - increase dilt dose now On anticoag - eliquis - in place of prior plavix +[id number] (since 03/10/13 DC) Echo 03/09/13: normal LV fx, no cor pulmonale follow up cards as planned

## 2013-03-12 NOTE — Assessment & Plan Note (Signed)
Temporarily stopped amlodipine early 10/2011 due to mild dependant edema  - but then resumed started ARB 10/2011 and titrated to max Resumed low dose amlodipine late 10/2011, then titrated to max dose amlodipine 11/2011 with continue daily diuretics  Improved BP until exacerbation by rapid A. Fib November 2014 Amlodipine discontinued in place of diltiazem Titrate diltiazem at this time for blood pressure and heart rate over 100 Note episode of bradycardia during hospitalization -patient and wife will monitor at home for potential recurrence of same And blood pressure or heart rate remained uncontrolled next 48 hours, family will call for followup visit here for cardiology, or go to ER sooner if worse  BP Readings from Last 3 Encounters:  03/12/13 172/80  03/12/13 160/84  03/10/13 103/57

## 2013-03-12 NOTE — Assessment & Plan Note (Signed)
Improved since last week - suspect due to RVR Ok to continue lasix as ongoing

## 2013-03-12 NOTE — Progress Notes (Signed)
Patient ID: [name], male    DOB: 1930-07-08, 79 y.o.   MRN: [id number]  HPI 11/16/10- 47 year old man followed for severe chronic asthma, sleep apnea, allergic rhinitis, complicated by HBP CAD history of atrial fibrillation, GERD Last here May 20, 2010 CPAP 10 all night every night- helps sleep. Asks refill Provigil to help with long drives.  Allergy vaccine GO definitely helps- he is convinced.  He notes no asthma and no prednisone since last November- credits frequent handwashing, avoiding people with colds and the allergy vaccine. No new concerns. Continues cardiac rehab.   03/03/11-  37 year old man followed for severe chronic asthma, sleep apnea, allergic rhinitis, complicated by HBP CAD history of atrial fibrillation, GERD. Wife here. He recently had his fourth hospital stay since March, for gallbladder pain, cholecystectomy, cardiac stent. Cholecystectomy was 2 weeks ago. Through these he had shortness of breath and cough which has persisted and keep him awake. His nose runs.  07/03/11- 33 year old man followed for severe chronic asthma, sleep apnea, allergic rhinitis, complicated by HBP, CAD, history of atrial fibrillation, GERD. Wife here. Since last here has been to urgent care in emergency room 4 times for exacerbations of asthma. On prednisone 10 mg/day since February from Dr [name]. Definitely having reflux events and we discussed how that may be the significant issue. Denies sinus infection. Had chest x-rays in January and February which she was told were negative. He asks to try increasing his CPAP pressure because he doesn't feel he is sleeping quite well enough. We discussed this.  07/18/11- 11 year old man followed for severe chronic asthma, sleep apnea, allergic rhinitis, complicated by HBP, CAD, history of atrial fibrillation, GERD. Wife here. Seen by Dr Daub-Increased SOB and wheezing; was put on prednisone to help keep patient out of hospital; still not feeling  much better. Has been on prednisone 60 mg daily for past 4 days but still significant shortness of breath with exertion and wheeze. No excess 2 chest x-rays have been clear and EKG "knot heart". Using home nebulizer with DuoNeb 4 times daily plus his rescue inhaler at least twice per day. Nothing seems infected. He has not recognized heartburn although that has been an issue in the past. Denies chest pain, fever, ankle edema or palpitation. Some nasal congestion without much sneezing. Has been indoors without much exposure to pollen.  08/08/11- 44 year old man followed for severe chronic asthma, sleep apnea, allergic rhinitis, complicated by HBP, CAD, history of atrial fibrillation, GERD. Wife here. Hospitalized April 11-17 with acute exacerbation of his chronic fixed asthma. Anxiety and reflux are thought to be important. He says he is "doing great" now. He feels an anxiety medication would help and we discussed this. Currently on prednisone 20 mg daily.  10/04/11-  22 year old man followed for severe chronic asthma, sleep apnea, allergic rhinitis, complicated by HBP, CAD, history of atrial fibrillation, GERD. Wife here  Pt states increase of sob,wheezing,fatigue since getting out of hospital . No hospitalizations since last here. He stays short of breath especially with exertional dyspnea but is comfortable sitting and when lying in bed. His wife points out he is able to do yard work using a [employer]. We reviewed Dr.Hochrein's cardiology note. Question, , each of his dyspnea is pulmonary versus cardiac. We were asked to draw labs at this visit. He was criticized for using his nebulizer machine too frequently before his hospitalization but has only used it a total of 7 times since then. He had remained on maintenance prednisone  since January. His wife gradually tapered it off so he has had none in the last 3 weeks. He continues, and wants to continue, allergy vaccine at 1:10, well tolerated. CXR 07/22/11-    reviewed with them: Findings:  Grossly unchanged enlarged cardiac silhouette and mediastinal  contours post median sternotomy and CABG. Atherosclerotic  calcification within the thoracic aorta. There is persistent mild  elevation of the right hemidiaphragm. Grossly unchanged bibasilar  heterogeneous opacities favored to represent atelectasis. Grossly  unchanged bones including mild compression deformity of a lower  thoracic vertebral body.  IMPRESSION:  No acute cardiopulmonary disease.  Original Report Authenticated By: Waynard Reeds, M.D.  Office Spirometry: FEV1 1.40/58%, FVC 2.44/78%, FEV1/FEC oh 0.57/73%, FEF 25-75% 0.56/23%.  01/08/12- 25 year old man followed for severe chronic asthma, sleep apnea, allergic rhinitis, complicated by HBP, CAD, history of atrial fibrillation, GERD. Wife here Needs refill for rescue inhaler as he is leaving for beach in am; breathing is doing pretty well; only one treatment since seeing [place] last. . Asks for an antibiotic to carry.  05/10/2012 Acute OV Complains of increased dyspnea , coughing and wheezing. This woke him up this morning and used his neb without relief then used his inhaler without any relief.  Dry cough and wheezing started last pm.  Patient denies any hemoptysis, orthopnea, PND, leg swelling, or calf pain. Patient reports that wheezes. Started 2-3 days ago and worsened. Last night. Prior to going to bed. He had started on prednisone earlier in the week. at 10 mg and is currently on 5 mg daily .  He had his rescue inhaler this am with some relief.  05/30/12- 34 year old man followed for severe chronic asthma, sleep apnea, allergic rhinitis, complicated by HBP, CAD, history of atrial fibrillation, GERD. Wife here He says he is a well now, off prednisone x2 weeks. Likes Mucinex DM. We discussed steroidsand I suggested bone density assessment. We discussed steroid sparing availability of Daliresp trial is frequency of bronchitic  exacerbations.  He continues using CPAP 12/ [employer] with no concerns.  09/05/12- 84 year old man followed for severe chronic asthma, sleep apnea, allergic rhinitis, complicated by HBP, CAD, history of atrial fibrillation, GERD.     Wife here FOLLOWS FOR: had to go to UC on 08-26-12 due to breathing issues. Was given shot, neb tx, and pred taper. Had caught a cold while fishing, leading to exacerbation. Finish prednisone taper 2 days ago. Feels "great" now. Allergy vaccine 1:10 GO CPAP 12/[employer] with good compliance and control CXR 08/26/12 IMPRESSION:  No active disease.  Original Report Authenticated By: Sander Radon, M.D.  11/04/16 -33 year old man followed for severe chronic asthma, sleep apnea, allergic rhinitis, complicated by HBP, CAD, history of atrial fibrillation/ pacemaker , GERD.     Wife here( She has mitochondrial myopathy) FOLOWS FOR: reports has been having diff w wheezing denies any chest tightness-- states he has been  seen in [place] [facility] every month for breathing and per that MD patient may need some medication readjustment   wheeze and shortness of breath, sometimes green mucus. Gets better with neb been steroid but then relapses each time. Uses home nebulizer 3 times daily but doesn't last. Blamed Advair for 2 episodes of pneumonia-discussed. They feel he is doing well with allergy vaccine 1: 10 GO. OSA- good compliance and control with CPAP 12/ [employer]..  03/12/13- 44 year old man followed for severe chronic asthma, sleep apnea, allergic rhinitis, complicated by HBP, CAD, history of atrial fibrillation/ pacemaker , GERD.     (  Wife  has mitochondrial myopathy) FOLLOWS FOR: since being on Dulera 100 has not had any flare ups or had to use nebulizer machine. Recent hospital stay at [facility] for heart issues- AFib/ tachycardia.  Feels "nervous, chilly" but not sick. Little cough or wheeze. Has been able to cut way down on nebulizer and w/o need recent prednisone. Ankles swell. Note  med list shows both diltiazem and Norvasc.    ROS-see HPI Constitutional:   No-   weight loss, night sweats, fevers, chills, fatigue, lassitude. HEENT:   No-  headaches, difficulty swallowing, tooth/dental problems, sore throat,       No-  sneezing, itching, ear ache, nasal congestion, post nasal drip,  CV:  No-   chest pain, orthopnea, PND, swelling in lower extremities, anasarca, dizziness, palpitations Resp: No-   shortness of breath with exertion or at rest.              No-   productive cough,  No non-productive cough,  No- coughing up of blood.              No-change in color of mucus.  No- wheezing.   Skin: No-   rash or lesions. GI:  No-   heartburn, indigestion, abdominal pain, nausea, vomiting,  GU:  MS:  No-   joint pain or swelling.   Neuro-     nothing unusual Psych:  No- change in mood or affect. No depression or anxiety.  No memory loss.  Objective:  OBJ- Physical Exam General- Alert, Oriented, Affect-appropriate, Distress- none acute Skin- rash-none, lesions- none, excoriation- none Lymphadenopathy- none Head- atraumatic            Eyes- Gross vision intact, PERRLA, conjunctivae and secretions clear            Ears- Hearing aid            Nose- Clear, no-Septal dev, mucus, polyps, erosion, perforation             Throat- Mallampati III , mucosa clear , drainage- none, tonsils- atrophic Neck- flexible , trachea midline, no stridor , thyroid nl, carotid no bruit Chest - symmetrical excursion , unlabored           Heart/CV- RRR , no murmur , no gallop  , no rub, nl s1 s2                           - JVD- none , edema+1-2, stasis changes- none, varices- none           Lung- Clear, wheeze- none, unlabored, cough- none , dullness-none, rub- none           Chest wall-  Abd-  Br/ Gen/ Rectal- Not done, not indicated Extrem- cyanosis- none, clubbing, none, atrophy- none, strength- nl Neuro- grossly intact to observation. No tremor

## 2013-03-12 NOTE — Assessment & Plan Note (Signed)
Cardiogenic, cor pulmonale and CCB side effect all contribute.

## 2013-03-12 NOTE — Assessment & Plan Note (Signed)
Rate controlled. Need to clarify how much CCB he is supposed to be taking. Doubt he was intended to tale both diltiazem and Norvasc. This may aggravate ankle edema.

## 2013-03-12 NOTE — Assessment & Plan Note (Signed)
DES 06/28/10- CABG 2008 -  No evidence for ischemic event prompting rapid A. Fib November 2014 hospitalization ASA and  plavix DC'd 03/10/13 when changed to Eliquis for A. Fib Continue beta-blocker and statin Follow up with cards as Scheduled to consider ischemic testing if needed

## 2013-03-12 NOTE — Progress Notes (Signed)
Pre-visit discussion using our clinic review tool. No additional management support is needed unless otherwise documented below in the visit note.  

## 2013-03-14 ENCOUNTER — Encounter (HOSPITAL_COMMUNITY): Payer: Self-pay | Admitting: Emergency Medicine

## 2013-03-14 ENCOUNTER — Other Ambulatory Visit: Payer: Self-pay

## 2013-03-14 ENCOUNTER — Emergency Department (HOSPITAL_COMMUNITY): Payer: Medicare Other

## 2013-03-14 ENCOUNTER — Emergency Department (HOSPITAL_COMMUNITY)
Admission: EM | Admit: 2013-03-14 | Discharge: 2013-03-14 | Disposition: A | Discharge status: Home / Self Care | Payer: Medicare Other | Attending: Emergency Medicine | Admitting: Emergency Medicine

## 2013-03-14 DIAGNOSIS — K219 Gastro-esophageal reflux disease without esophagitis: Secondary | ICD-10-CM | POA: Insufficient documentation

## 2013-03-14 DIAGNOSIS — R000 Tachycardia, unspecified: Secondary | ICD-10-CM | POA: Insufficient documentation

## 2013-03-14 DIAGNOSIS — I2510 Atherosclerotic heart disease of native coronary artery without angina pectoris: Secondary | ICD-10-CM | POA: Insufficient documentation

## 2013-03-14 DIAGNOSIS — Z87891 Personal history of nicotine dependence: Secondary | ICD-10-CM | POA: Insufficient documentation

## 2013-03-14 DIAGNOSIS — F1021 Alcohol dependence, in remission: Secondary | ICD-10-CM | POA: Insufficient documentation

## 2013-03-14 DIAGNOSIS — Z7901 Long term (current) use of anticoagulants: Secondary | ICD-10-CM | POA: Insufficient documentation

## 2013-03-14 DIAGNOSIS — Z79899 Other long term (current) drug therapy: Secondary | ICD-10-CM | POA: Insufficient documentation

## 2013-03-14 DIAGNOSIS — IMO0002 Reserved for concepts with insufficient information to code with codable children: Secondary | ICD-10-CM | POA: Insufficient documentation

## 2013-03-14 DIAGNOSIS — Z85828 Personal history of other malignant neoplasm of skin: Secondary | ICD-10-CM | POA: Insufficient documentation

## 2013-03-14 DIAGNOSIS — I10 Essential (primary) hypertension: Secondary | ICD-10-CM | POA: Insufficient documentation

## 2013-03-14 DIAGNOSIS — Z9861 Coronary angioplasty status: Secondary | ICD-10-CM | POA: Insufficient documentation

## 2013-03-14 DIAGNOSIS — Z9849 Cataract extraction status, unspecified eye: Secondary | ICD-10-CM | POA: Insufficient documentation

## 2013-03-14 DIAGNOSIS — F3289 Other specified depressive episodes: Secondary | ICD-10-CM | POA: Insufficient documentation

## 2013-03-14 DIAGNOSIS — F329 Major depressive disorder, single episode, unspecified: Secondary | ICD-10-CM | POA: Insufficient documentation

## 2013-03-14 DIAGNOSIS — J45909 Unspecified asthma, uncomplicated: Secondary | ICD-10-CM | POA: Insufficient documentation

## 2013-03-14 DIAGNOSIS — Z951 Presence of aortocoronary bypass graft: Secondary | ICD-10-CM | POA: Insufficient documentation

## 2013-03-14 DIAGNOSIS — E785 Hyperlipidemia, unspecified: Secondary | ICD-10-CM | POA: Insufficient documentation

## 2013-03-14 DIAGNOSIS — Z8669 Personal history of other diseases of the nervous system and sense organs: Secondary | ICD-10-CM | POA: Insufficient documentation

## 2013-03-14 LAB — CBC
HCT: 42.4 % (ref 39.0–52.0)
Hemoglobin: 14.3 g/dL (ref 13.0–17.0)
MCH: 29.3 pg (ref 26.0–34.0)
MCHC: 33.7 g/dL (ref 30.0–36.0)
MCV: 86.9 fL (ref 78.0–100.0)
Platelets: 215 10*3/uL (ref 150–400)

## 2013-03-14 LAB — BASIC METABOLIC PANEL
Calcium: 9.4 mg/dL (ref 8.4–10.5)
Creatinine, Ser: 1.1 mg/dL (ref 0.50–1.35)
GFR calc non Af Amer: 61 mL/min — ABNORMAL LOW (ref >90)
Glucose, Bld: 123 mg/dL — ABNORMAL HIGH (ref 70–99)
Potassium: 4.1 mEq/L (ref 3.5–5.1)
Sodium: 138 mEq/L (ref 135–145)

## 2013-03-14 LAB — PROTIME-INR
INR: 1.4 (ref 0.00–1.49)
Prothrombin Time: 16.8 seconds — ABNORMAL HIGH (ref 11.6–15.2)

## 2013-03-14 LAB — POCT I-STAT TROPONIN I: Troponin i, poc: 0.03 ng/mL (ref 0.00–0.08)

## 2013-03-14 MED ORDER — SODIUM CHLORIDE 0.9 % IV BOLUS (SEPSIS)
500.0000 mL | Freq: Once | INTRAVENOUS | Status: AC
  Administered 2013-03-14: 500 mL via INTRAVENOUS

## 2013-03-14 MED ORDER — DILTIAZEM HCL 120 MG PO TABS: 120.0000 mg | ORAL_TABLET | Freq: Two times a day (BID) | ORAL | Status: DC

## 2013-03-14 MED ORDER — DILTIAZEM HCL 120 MG PO TABS: ORAL_TABLET | ORAL | Status: DC

## 2013-03-14 NOTE — ED Notes (Signed)
Per pt sts was seen here 1 week ago for elevated HR. sts since his HR has yet to decrease. sts some nausea. Denies chest pain, SOB. sts last night he had a sharp pain in his chest that lasted a few seconds. Pt HR 118 at triage.

## 2013-03-14 NOTE — Telephone Encounter (Signed)
The patient's wife called she was confused about her husband cardizem 120 mg, looking at an ED note the ED doctor talked to Dr Gala Romney and he increase it to a 1 and 1/2 tab twice a day for a total of 180 mg twice a day

## 2013-03-14 NOTE — ED Notes (Signed)
Pt taken to xray 

## 2013-03-14 NOTE — ED Provider Notes (Signed)
CSN: [id number]     Arrival date & time 03/14/13  4098 History   First MD Initiated Contact with Patient 03/14/13 [phone]     Chief Complaint  Patient presents with  . Tachycardia   (Consider location/radiation/quality/duration/timing/severity/associated sxs/prior Treatment) The history is provided by the patient and medical records.   This is a 79 y.o. M with PMH significnat for CAD s/p CABG, paroxysmal AFIB, asthma, HTN, HLP, presenting to the ED for persistent palpitations and tachycardia.  Pt was seen in the ED on 03/08/13 for the same and admitted for rate control.  Pt states while in hospital, HR was controlled but once returning home has increased again.  Pt was started on diltiazem and eloquis at time of discharge by his cardiologist, Dr. Antoine Poche.  He saw his PCP, Dr. Felicity Coyer, on 03/12/13-- called today about sx and told to come to the ED.  Pt endorses some nausea and vomiting when sx occur.  No dizziness or diaphoresis.  Did have 1 episode of sharp, left sided chest pain yesterday that only lasted a few minutes, no chest pain today.  Has had some cough and chest congestion.  Denies SOB or weakness.  Pt had recent CTA on 11/30/14which was negative for PE.  Recent 2D echo on 02/27/13 revealing mild LVH, EF preserved and approx at 55-60%.   Past Medical History  Diagnosis Date  . CORONARY HEART DISEASE     a. s/p CABG;  b.  cath 06/27/10: S-Dx occluded, S-PDA 80-90% (tx with PCI); S-OM ok, L-LAD ok;  EF 65-70%  c.  s/p Promus DES to S-PDA 06/2010;   d. Myoview 8/12: low risk  . FIBRILLATION, ATRIAL     post op; ?documented during hosp. 06/2010  . SLEEP APNEA 09/2001    NPSG AHI 22/HR  . ALLERGIC RHINITIS   . ASTHMA   . Depression   . GERD     with HH, hx esophageal stricture  . DYSLIPIDEMIA   . HYPERTENSION     Echo 3/12: EF 55-60%; mod LVH; mild AS/AI; LAE; PASP 38; mild pulmo HTN  . Personal history of alcoholism   . Diverticulosis   . Benign liver cyst   . Skin cancer     L  forearm  . Cataract     surgery to both eyes  . Esophageal stricture   . Hiatal hernia    Past Surgical History  Procedure Laterality Date  . Coronary artery bypass graft  02/2007  . Hernia repair  12/03/07  . Tonsillectomy    . Coronary angioplasty with stent placement  07/2010  . Cataract extraction, bilateral  2007  . Hernia repair  unsure ?60's  . Cholecystectomy  02/21/2011    Procedure: LAPAROSCOPIC CHOLECYSTECTOMY WITH INTRAOPERATIVE CHOLANGIOGRAM;  Surgeon: Valarie Merino, MD;  Location: [facility];  Service: General;  Laterality: N/A;   Family History  Problem Relation Age of Onset  . COPD Sister   . Asthma Sister   . Emphysema Sister   . Hypertension Sister   . Hypertension Mother   . Colon cancer Neg Hx   . Heart disease Brother   . Heart disease Mother   . Heart disease Father    History  Substance Use Topics  . Smoking status: Former Smoker -- 3.00 packs/day for 40 years    Types: Cigarettes    Quit date: 04/11/1979  . Smokeless tobacco: Never Used  . Alcohol Use: No    Review of Systems  Cardiovascular: Positive for palpitations.  Tachycardia  All other systems reviewed and are negative.    Allergies  Codeine  Home Medications   Current Outpatient Rx  Name  Route  Sig  Dispense  Refill  . acidophilus (RISAQUAD) CAPS   Oral   Take 1 capsule by mouth daily. [facility] probiotics         . albuterol (PROVENTIL HFA;VENTOLIN HFA) 108 (90 BASE) MCG/ACT inhaler   Inhalation   Inhale 1-2 puffs into the lungs every 6 (six) hours as needed for wheezing or shortness of breath.         Marland Kitchen apixaban (ELIQUIS) 5 MG TABS tablet   Oral   Take 1 tablet (5 mg total) by mouth 2 (two) times daily.   60 tablet   0   . bisoprolol (ZEBETA) 10 MG tablet   Oral   Take 10 mg by mouth daily.         . budesonide (PULMICORT) 0.25 MG/2ML nebulizer solution   Nebulization   Take 0.25 mg by nebulization 2 (two) times daily as needed. For asthma         .  calcium-vitamin D (OSCAL WITH D) 500-200 MG-UNIT per tablet   Oral   Take 1 tablet by mouth 2 (two) times daily.         . cetaphil (CETAPHIL) lotion   Topical   Apply 1 application topically as needed for dry skin.         . cloNIDine (CATAPRES) 0.3 MG tablet   Oral   Take 1 tablet (0.3 mg total) by mouth 2 (two) times daily. Takes 0.3mg  in the morning and 0.6mg  at night         . denosumab (PROLIA) 60 MG/ML SOLN injection   Subcutaneous   Inject 60 mg into the skin every 6 (six) months. Administer in upper arm, thigh, or abdomen   1 mL   5   . diltiazem (CARDIZEM CD) 120 MG 24 hr capsule   Oral   Take 1 capsule (120 mg total) by mouth 2 (two) times daily.   60 capsule   0   . famotidine (PEPCID) 20 MG tablet   Oral   Take 20 mg by mouth at bedtime.         . furosemide (LASIX) 80 MG tablet   Oral   Take 80 mg by mouth daily.         Marland Kitchen ipratropium-albuterol (DUONEB) 0.5-2.5 (3) MG/3ML SOLN   Nebulization   Take 3 mLs by nebulization every 6 (six) hours as needed (for shortness of breath).         Marland Kitchen ketoconazole (NIZORAL) 2 % cream   Topical   Apply topically 2 (two) times daily. To soles of feet and between toes   30 g   0   . latanoprost (XALATAN) 0.005 % ophthalmic solution   Both Eyes   Place 1 drop into both eyes at bedtime.          Marland Kitchen loperamide (IMODIUM A-D) 2 MG tablet   Oral   Take 2 mg by mouth as needed. For loose stool         . LORazepam (ATIVAN) 0.5 MG tablet   Oral   Take 0.5 mg by mouth 2 (two) times daily.         Marland Kitchen losartan (COZAAR) 100 MG tablet   Oral   Take 100 mg by mouth daily.         . mometasone-formoterol (DULERA) 100-5 MCG/ACT  AERO   Inhalation   Inhale 2 puffs into the lungs 2 (two) times daily.         . montelukast (SINGULAIR) 10 MG tablet   Oral   Take 10 mg by mouth at bedtime.         . nitroGLYCERIN (NITROSTAT) 0.4 MG SL tablet   Sublingual   Place 0.4 mg under the tongue every 5 (five)  minutes as needed for chest pain.         . potassium chloride SA (K-DUR,KLOR-CON) 20 MEQ tablet   Oral   Take 20 mEq by mouth 2 (two) times daily.         . pravastatin (PRAVACHOL) 80 MG tablet   Oral   Take 80 mg by mouth daily.         Marland Kitchen EXPIRED: promethazine (PHENERGAN) 12.5 MG tablet   Oral   Take 1 tablet (12.5 mg total) by mouth every 6 (six) hours as needed for nausea.   30 tablet   0   . promethazine (PHENERGAN) 25 MG tablet   Oral   Take 1 tablet (25 mg total) by mouth every 6 (six) hours as needed for nausea or vomiting.   30 tablet   0    BP 151/86  Pulse 117  Temp(Src) 97.7 F (36.5 C) (Oral)  Resp 22  Ht 5\' 2"  (1.575 m)  Wt 155 lb 9.6 oz (70.58 kg)  BMI 28.45 kg/m2  SpO2 99%  Physical Exam  Nursing note and vitals reviewed. Constitutional: He is oriented to person, place, and time. He appears well-developed and well-nourished. No distress.  HENT:  Head: Normocephalic and atraumatic.  Mouth/Throat: Oropharynx is clear and moist.  Eyes: Conjunctivae and EOM are normal. Pupils are equal, round, and reactive to light.  Neck: Normal range of motion.  Cardiovascular: Regular rhythm, normal heart sounds, intact distal pulses and normal pulses.  Tachycardia present.   Pulmonary/Chest: Effort normal and breath sounds normal. No respiratory distress. He has no wheezes.  Abdominal: Soft. Bowel sounds are normal. There is no tenderness. There is no guarding.  Musculoskeletal: Normal range of motion. He exhibits no edema.  Neurological: He is alert and oriented to person, place, and time.  Skin: Skin is warm and dry. He is not diaphoretic.  Psychiatric: He has a normal mood and affect.    ED Course  Procedures (including critical care time) Labs Review Labs Reviewed  BASIC METABOLIC PANEL - Abnormal; Notable for the following:    Glucose, Bld 123 (*)    GFR calc non Af Amer 61 (*)    GFR calc Af Amer 71 (*)    All other components within normal limits   PROTIME-INR - Abnormal; Notable for the following:    Prothrombin Time 16.8 (*)    All other components within normal limits  PRO B NATRIURETIC PEPTIDE - Abnormal; Notable for the following:    Pro B Natriuretic peptide (BNP) 755.0 (*)    All other components within normal limits  CBC  POCT I-STAT TROPONIN I   Imaging Review Dg Chest 2 View  03/14/2013   CLINICAL DATA:  Tachycardia.  Shortness of breath.  EXAM: CHEST  2 VIEW  COMPARISON:  CT 03/09/2013.  Chest x-ray 03/08/2012.  FINDINGS: Mediastinum and hilar structures normal. Heart size normal. Prior CABG. No focal infiltrate. No pleural effusion or pneumothorax. Prominent nipple shadows noted. Degenerative changes thoracic spine. Diffuse thoracic cage osteopenia. Surgical clips right upper quadrant consistent with prior cholecystectomy.  IMPRESSION: No active cardiopulmonary disease.   Electronically Signed   By: Maisie Fus  Register   On: 03/14/2013 10:47    EKG Interpretation    Date/Time:  Friday March 14 2013 09:54:09 EST Ventricular Rate:  118 PR Interval:  152 QRS Duration: 96 QT Interval:  320 QTC Calculation: 448 R Axis:   21 Text Interpretation:  Sinus tachycardia Incomplete right bundle branch block Cannot rule out Anterior infarct , age undetermined Abnormal ECG Confirmed by [facility]  MD, DAVID ([id number]) on 03/14/2013 10:05:21 AM            MDM   1. Tachycardia    EKG sinus tach, unchanged from previous.  Trop negative.  CXR clear.  Labs as above.  Pt with persistent tachycardia despite fluids.  Pt has no chest pain or SOB, recently started on anti-coagulants with negative CTA 5 days ago.  Consulted cardiology, spoke with Dr. Gala Romney-- advised to increased cardizem to from 120 BID to 180 BID.  Office will call pt with FU appt for next week.  Strict return precautions advised should sx change or worsen including chest pain, SOB, dizziness, weakness, diaphoresis, etc.-- pt and wife acknowledged understanding and  agreed.  Discussed with Dr. Redgie Grayer who agrees with assessment and plan of care.  Garlon Hatchet, PA-C 03/14/13 1544

## 2013-03-14 NOTE — ED Provider Notes (Signed)
Medical screening examination/treatment/procedure(s) were conducted as a shared visit with non-physician practitioner(s) and myself.  I personally evaluated the patient during the encounter.  EKG Interpretation    Date/Time:  Friday March 14 2013 09:54:09 EST Ventricular Rate:  118 PR Interval:  152 QRS Duration: 96 QT Interval:  320 QTC Calculation: 448 R Axis:   21 Text Interpretation:  Sinus tachycardia Incomplete right bundle branch block Cannot rule out Anterior infarct , age undetermined Abnormal ECG Confirmed by [facility]  MD, Liyat Faulkenberry ([id number]) on 03/14/2013 10:05:21 AM            Tachycardia.  Pt discussed with cardiology who recommended increase cardizem dose.  ER precautions given.  Pt is to f/u with cards next week.  No issues in ER.    Darlys Gales, MD 03/14/13 [phone]

## 2013-03-19 ENCOUNTER — Ambulatory Visit (INDEPENDENT_AMBULATORY_CARE_PROVIDER_SITE_OTHER): Payer: Medicare Other | Admitting: Internal Medicine

## 2013-03-19 ENCOUNTER — Encounter: Payer: Self-pay | Admitting: Internal Medicine

## 2013-03-19 VITALS — BP 152/84 | HR 99 | Temp 98.1°F | Wt 162.8 lb

## 2013-03-19 DIAGNOSIS — I10 Essential (primary) hypertension: Secondary | ICD-10-CM

## 2013-03-19 DIAGNOSIS — I4891 Unspecified atrial fibrillation: Secondary | ICD-10-CM

## 2013-03-19 MED ORDER — DIGOXIN 250 MCG PO TABS: 250.0000 ug | ORAL_TABLET | Freq: Every day | ORAL | Status: DC

## 2013-03-19 MED ORDER — DILTIAZEM HCL ER COATED BEADS 120 MG PO CP24: ORAL_CAPSULE | ORAL | Status: DC

## 2013-03-19 MED ORDER — BISOPROLOL FUMARATE 10 MG PO TABS: 10.0000 mg | ORAL_TABLET | Freq: Every day | ORAL | Status: DC

## 2013-03-19 MED ORDER — CLONIDINE HCL 0.3 MG PO TABS: 0.3000 mg | ORAL_TABLET | Freq: Two times a day (BID) | ORAL | Status: DC

## 2013-03-19 NOTE — Assessment & Plan Note (Signed)
hosp for same 02/2013 and again 03/2013 reviewed Still highly symptomatic RVR - fatigue and dyspnea reviewed current dilt dose: 360mg  qd Also on Zebeta - avoid other beta-blocker due to adv pulm disease Add dig now given lack of other med choices and follow up closely with cards - ?amio or DCCV or other if remains symptomatic continue anticoag - eliquis - in place of prior plavix +[id number] (since 03/10/13 DC) Echo 03/09/13: normal LV fx, no cor pulmonale; CT angio neg for PE and no hypoxia follow up cards as planned next week

## 2013-03-19 NOTE — Progress Notes (Signed)
Subjective:    Patient ID: [name]., male    DOB: 01-16-1931, 79 y.o.   MRN: [id number]  Hypertension Associated symptoms include anxiety, palpitations and shortness of breath (chronic). Pertinent negatives include no chest pain or headaches. Hypertensive end-organ damage includes CAD/MI. There is no history of CVA, heart failure or PVD.   Also persisting symptomatic rapid A. Fib - hosp for same x 2 (02/2013 with edema, shortness of breath and heart rate over 100 beats per minute) then last week after OV here for symptomatic RVR.  Also reviewed chronic medical issues today  Past Medical History  Diagnosis Date  . CORONARY HEART DISEASE     a. s/p CABG;  b.  cath 06/27/10: S-Dx occluded, S-PDA 80-90% (tx with PCI); S-OM ok, L-LAD ok;  EF 65-70%  c.  s/p Promus DES to S-PDA 06/2010;   d. Myoview 8/12: low risk  . FIBRILLATION, ATRIAL     post op; ?documented during hosp. 06/2010  . SLEEP APNEA 09/2001    NPSG AHI 22/HR  . ALLERGIC RHINITIS   . ASTHMA   . Depression   . GERD     with HH, hx esophageal stricture  . DYSLIPIDEMIA   . HYPERTENSION     Echo 3/12: EF 55-60%; mod LVH; mild AS/AI; LAE; PASP 38; mild pulmo HTN  . Personal history of alcoholism   . Diverticulosis   . Benign liver cyst   . Skin cancer     L forearm  . Cataract     surgery to both eyes  . Esophageal stricture   . Hiatal hernia     Review of Systems  Constitutional: Positive for fatigue. Negative for fever and unexpected weight change.  Respiratory: Positive for cough (dry) and shortness of breath (chronic). Negative for wheezing.   Cardiovascular: Positive for palpitations and leg swelling (improving). Negative for chest pain.  Gastrointestinal: Positive for nausea. Negative for vomiting, abdominal pain and constipation.  Musculoskeletal: Negative for arthralgias and myalgias.  Neurological: Negative for dizziness, weakness, light-headedness and headaches.      Objective:   Physical Exam BP  152/84  Pulse 99  Temp(Src) 98.1 F (36.7 C) (Oral)  Wt 162 lb 12.8 oz (73.846 kg)  SpO2 97% Wt Readings from Last 3 Encounters:  03/19/13 162 lb 12.8 oz (73.846 kg)  03/14/13 155 lb 9.6 oz (70.58 kg)  03/12/13 158 lb (71.668 kg)   Constitutional:  He is short and overweight, appears well-developed and well-nourished. No distress. Wife at side Cardiovascular: Irregular rate and rhythm. Slightly tachy.  No murmur heard. 1+ mild BLE edema (improved) Pulmonary/Chest: resting mild dyspnea, worse with conversation - near baseline. No respiratory distress. No wheeze, fair air movement B.  Skin: Solar and senile changes, chronic hemochromatic and tattooed forearms. Skin is warm and dry.  No erythema or ulceration.  Psychiatric: he has a normal mood and affect. behavior is normal. Judgment and thought content normal.      Lab Results  Component Value Date   WBC 9.7 03/14/2013   HGB 14.3 03/14/2013   HCT 42.4 03/14/2013   PLT 215 03/14/2013   CHOL 123 10/16/2012   TRIG 111.0 10/16/2012   HDL 41.10 10/16/2012   ALT 11 03/10/2013   AST 14 03/10/2013   NA 138 03/14/2013   K 4.1 03/14/2013   CL 101 03/14/2013   CREATININE 1.10 03/14/2013   BUN 18 03/14/2013   CO2 26 03/14/2013   TSH 1.170 03/09/2013   PSA 0.62  10/10/2010   INR 1.40 03/14/2013   HGBA1C 6.4 10/16/2012    Assessment & Plan:   See problem list. Medications and labs reviewed today.

## 2013-03-19 NOTE — Patient Instructions (Signed)
It was good to see you today.  We have reviewed your prior records including labs and tests today  Medications reviewed and updated:  continue Diltiazem to 120mg  2 caps in AM and 1 cap in PM (total 360mg  daily) for blood pressure and rapid heart rate  Start Digoxin 0.25 once daily  Your prescription(s) have been submitted to your pharmacy. Please take as directed and contact our office if you believe you are having problem(s) with the medication(s).  Please schedule followup in 6 weeks for recheck, call sooner if problems.  if your symptoms continue to worsen (pain, fever, shortness of breath, etc), or if you are unable take anything by mouth (pills, fluids, etc), you should go to the emergency room for further evaluation and treatment.

## 2013-03-19 NOTE — Assessment & Plan Note (Signed)
Temporarily stopped amlodipine early 10/2011 due to mild dependant edema, but then resumed started ARB 10/2011 and titrated to max Resumed low dose amlodipine late 10/2011, then titrated to max dose amlodipine 11/2011 with continue daily diuretics  Improved BP ---until exacerbation by hosp for rapid A. Fib November 2014 Amlodipine discontinued in place of diltiazem 04/2012 hosp for RVR Reviewed episode of bradycardia during 02/2013 hospitalization -patient and wife will monitor at home for potential recurrence of same If blood pressure or heart rate remained uncontrolled over next 48 hours despite addition of Digoxin, family will call for followup visit here for cardiology, or go to ER sooner if worse  BP Readings from Last 3 Encounters:  03/19/13 152/84  03/14/13 152/67  03/12/13 172/80

## 2013-03-19 NOTE — Progress Notes (Signed)
Pre-visit discussion using our clinic review tool. No additional management support is needed unless otherwise documented below in the visit note.  

## 2013-03-20 ENCOUNTER — Telehealth: Payer: Self-pay | Admitting: *Deleted

## 2013-03-20 ENCOUNTER — Other Ambulatory Visit: Payer: Self-pay | Admitting: *Deleted

## 2013-03-20 NOTE — Telephone Encounter (Signed)
Received fax pt is needing PA on his digoxin. Called insurance faxing over PA form to be completed...Raechel Chute

## 2013-03-20 NOTE — Telephone Encounter (Signed)
S/w pt's wife per [facility] request will come in tomorrow at 8:30 am. For ov.  Pt's wife agreeable to plan

## 2013-03-20 NOTE — Telephone Encounter (Signed)
Pt's wife calling to find out if pt should take medication's in the am before ov since pt is having problem with medication, [name] was advised and stated pt should take medication before ov. Pt's wife stated verbal understanding

## 2013-03-21 ENCOUNTER — Encounter: Payer: Self-pay | Admitting: Physician Assistant

## 2013-03-21 ENCOUNTER — Ambulatory Visit (INDEPENDENT_AMBULATORY_CARE_PROVIDER_SITE_OTHER): Payer: Medicare Other | Admitting: Physician Assistant

## 2013-03-21 VITALS — BP 140/58 | HR 58 | Ht 62.0 in | Wt 162.0 lb

## 2013-03-21 DIAGNOSIS — E785 Hyperlipidemia, unspecified: Secondary | ICD-10-CM

## 2013-03-21 DIAGNOSIS — J449 Chronic obstructive pulmonary disease, unspecified: Secondary | ICD-10-CM

## 2013-03-21 DIAGNOSIS — I10 Essential (primary) hypertension: Secondary | ICD-10-CM

## 2013-03-21 DIAGNOSIS — I4891 Unspecified atrial fibrillation: Secondary | ICD-10-CM

## 2013-03-21 DIAGNOSIS — I2510 Atherosclerotic heart disease of native coronary artery without angina pectoris: Secondary | ICD-10-CM

## 2013-03-21 MED ORDER — DILTIAZEM HCL ER COATED BEADS 120 MG PO CP24: ORAL_CAPSULE | ORAL | Status: DC

## 2013-03-21 MED ORDER — DIGOXIN 250 MCG PO TABS: ORAL_TABLET | ORAL | Status: DC

## 2013-03-21 NOTE — Progress Notes (Signed)
[address] [place], [place]  [address] Phone: [phone] Fax:  [phone]  Date:  03/21/2013   ID:  [name]., DOB 11-Apr-1930, MRN [id number]  PCP:  Rene Paci, MD  Cardiologist:  Dr. Rollene Rotunda     History of Present Illness: [name]. is a 79 y.o. male with a hx of CAD, s/p CABG, s/p Promus DES to S-PDA in 06/2010, HTN, HL, PAD, severe chronic asthma (followed by Dr. Maple Hudson).  Myoview (12/2010):  Ant apex, ant wall ant lat wall ischemia (mild), EF 69%.  This was a low risk study and med Rx was continued.  Carotid US (10/2012):  bilat 40-59% ICA, probable L subclavian stenosis with mild steal (repeat 1  Year).  He was admitted recently 11/29-12/1 with tachycardia and parox AFib.  Chest CTA (03/09/13):  Neg for pulmonary embolus.  He reverted to NSR with rate control Rx.  CHADS2-VASc=4.  He was placed on Eliquis.  Echo (03/09/13):  Mild LVH, EF 55-60%, trivial AI, MAC, mild LAE, mild RVE, mild RAE, PASP 41.  Since d/c, he has been seen in the ED and x 2 by Rene Paci, MD for uncontrolled HR.  His medications have been adjusted.    The patient notes intermittent episodes of weakness and dizziness associated with rapid palpitations. He started on digoxin 2 days ago. Yesterday he felt tired and slept most of the day. Today, he decided to not take andy of his medications because his heart rate was in the upper 40s. He denies any syncope or near-syncope. He denies chest pain. He denies orthopnea or PND. He sleeps with CPAP. He notes LE edema but this is improved. He notes stable dyspnea without significant change.  Recent Labs: 10/16/2012: HDL 41.10; LDL (calc) 60  69/62/9528: TSH 1.170  03/10/2013: ALT 11  03/14/2013: Creatinine 1.10; Hemoglobin 14.3; Potassium 4.1; Pro B Natriuretic peptide (BNP) 755.0*   Wt Readings from Last 3 Encounters:  03/21/13 162 lb (73.483 kg)  03/19/13 162 lb 12.8 oz (73.846 kg)  03/14/13 155 lb 9.6 oz (70.58 kg)      Past Medical History  Diagnosis Date  . CORONARY HEART DISEASE     a. s/p CABG;  b.  cath 06/27/10: S-Dx occluded, S-PDA 80-90% (tx with PCI); S-OM ok, L-LAD ok;  EF 65-70%  c.  s/p Promus DES to S-PDA 06/2010;   d. Myoview 8/12: low risk  . FIBRILLATION, ATRIAL     post op; ?documented during hosp. 06/2010  . SLEEP APNEA 09/2001    NPSG AHI 22/HR  . ALLERGIC RHINITIS   . ASTHMA   . Depression   . GERD     with HH, hx esophageal stricture  . DYSLIPIDEMIA   . HYPERTENSION     Echo 3/12: EF 55-60%; mod LVH; mild AS/AI; LAE; PASP 38; mild pulmo HTN  . Personal history of alcoholism   . Diverticulosis   . Benign liver cyst   . Skin cancer     L forearm  . Cataract     surgery to both eyes  . Esophageal stricture   . Hiatal hernia     Current Outpatient Prescriptions  Medication Sig Dispense Refill  . acidophilus (RISAQUAD) CAPS Take 1 capsule by mouth daily. Phillips probiotics      . albuterol (PROVENTIL HFA;VENTOLIN HFA) 108 (90 BASE) MCG/ACT inhaler Inhale 1-2 puffs into the lungs every 6 (six) hours as needed for wheezing or shortness of breath.      Marland Kitchen  apixaban (ELIQUIS) 5 MG TABS tablet Take 1 tablet (5 mg total) by mouth 2 (two) times daily.  60 tablet  0  . bisoprolol (ZEBETA) 10 MG tablet Take 1 tablet (10 mg total) by mouth daily.  30 tablet  5  . budesonide (PULMICORT) 0.25 MG/2ML nebulizer solution Take 0.25 mg by nebulization 2 (two) times daily as needed. For asthma      . calcium-vitamin D (OSCAL WITH D) 500-200 MG-UNIT per tablet Take 1 tablet by mouth 2 (two) times daily.      . cetaphil (CETAPHIL) lotion Apply 1 application topically as needed for dry skin.      . cloNIDine (CATAPRES) 0.3 MG tablet Take 1 tablet (0.3 mg total) by mouth 2 (two) times daily. Takes 0.3mg  in the morning and 0.6mg  at night      . denosumab (PROLIA) 60 MG/ML SOLN injection Inject 60 mg into the skin every 6 (six) months. Administer in upper arm, thigh, or abdomen  1 mL  5  . digoxin  (LANOXIN) 0.25 MG tablet Take 1 tablet (250 mcg total) by mouth daily.  90 tablet  3  . diltiazem (CARDIZEM CD) 120 MG 24 hr capsule 2 cap in AM and 1 cap in PM (3 total daily)  90 capsule  0  . famotidine (PEPCID) 20 MG tablet Take 20 mg by mouth at bedtime.      . furosemide (LASIX) 80 MG tablet Take 80 mg by mouth daily.      Marland Kitchen ipratropium-albuterol (DUONEB) 0.5-2.5 (3) MG/3ML SOLN Take 3 mLs by nebulization every 6 (six) hours as needed (for shortness of breath).      Marland Kitchen ketoconazole (NIZORAL) 2 % cream Apply topically 2 (two) times daily. To soles of feet and between toes  30 g  0  . latanoprost (XALATAN) 0.005 % ophthalmic solution Place 1 drop into both eyes at bedtime.       Marland Kitchen loperamide (IMODIUM A-D) 2 MG tablet Take 2 mg by mouth as needed. For loose stool      . LORazepam (ATIVAN) 0.5 MG tablet Take 0.5 mg by mouth 2 (two) times daily.      Marland Kitchen losartan (COZAAR) 100 MG tablet Take 100 mg by mouth daily.      . mometasone-formoterol (DULERA) 100-5 MCG/ACT AERO Inhale 2 puffs into the lungs 2 (two) times daily.      . montelukast (SINGULAIR) 10 MG tablet Take 10 mg by mouth at bedtime.      . nitroGLYCERIN (NITROSTAT) 0.4 MG SL tablet Place 0.4 mg under the tongue every 5 (five) minutes as needed for chest pain.      . potassium chloride SA (K-DUR,KLOR-CON) 20 MEQ tablet Take 20 mEq by mouth 2 (two) times daily.      . pravastatin (PRAVACHOL) 80 MG tablet Take 80 mg by mouth daily.      . promethazine (PHENERGAN) 25 MG tablet Take 1 tablet (25 mg total) by mouth every 6 (six) hours as needed for nausea or vomiting.  30 tablet  0  . [DISCONTINUED] hydrochlorothiazide (MICROZIDE) 12.5 MG capsule 1 by mouth per day AS NEEDED only for swelling  90 capsule  3   No current facility-administered medications for this visit.    Allergies:   Codeine   Social History:  The patient  reports that he quit smoking about 33 years ago. His smoking use included Cigarettes. He has a 120 pack-year smoking  history. He has never used smokeless tobacco. He  reports that he does not drink alcohol or use illicit drugs.   Family History:  The patient's family history includes Asthma in his sister; COPD in his sister; Emphysema in his sister; Heart disease in his brother, father, and mother; Hypertension in his mother and sister. There is no history of Colon cancer.   ROS:  Please see the history of present illness.   He has a chronic cough.   All other systems reviewed and negative.   PHYSICAL EXAM: VS:  BP 140/58  Pulse 58  Ht 5\' 2"  (1.575 m)  Wt 162 lb (73.483 kg)  BMI 29.62 kg/m2 Well nourished, well developed, in no acute distress HEENT: normal Neck: no JVD Cardiac:  normal S1, S2; RRR; no murmur Lungs:  clear to auscultation bilaterally, no wheezing, rhonchi or rales Abd: soft, nontender, no hepatomegaly Ext: trace-1+ bilateral LE edema Skin: warm and dry Neuro:  CNs 2-12 intact, no focal abnormalities noted  EKG:  Sinus bradycardia, HR 58, normal axis, incomplete RBBB, nonspecific ST-T wave changes     ASSESSMENT AND PLAN:  1. Atrial Fibrillation:  He is having paroxysmal atrial fibrillation. He has had several adjustments in rate controlling medications over the last several days. He is now in sinus bradycardia. He likely needs antiarrhythmic drug therapy to maintain sinus rhythm. I reviewed his case with Dr. Antoine Poche (who followed him in the hospital and is his primary cardiologist) over the telephone today. In the event that we started antiarrhythmic drug therapy, his only real option would be Tikosyn. He would not be an ideal candidate for amiodarone given his lung disease. Given his coronary artery disease, he is not a candidate for class IC drugs. After further review, we have decided to place him on a 21 day event monitor to better define his rhythm and symptoms.  He has a follow up coming up in several weeks with Dr. Antoine Poche. He will keep that appointment. depending on the results of  his monitor, we will decide whether to admit him to the hospital for Tikosyn load.  For now, given his current heart rate and rhythm, I will have him hold digoxin and decrease his diltiazem to 120 mg twice a day. If the patient notes a heart rate over 100, he will take half a tablet of digoxin that day and call our office. I had a long discussion with the patient and his wife today regarding the nature of atrial fibrillation and potential treatments.  Continue Apixaban. Plan follow up basic metabolic panel and CBC at follow up visit. 2. Severe Asthma:  F/u with pulmonary as planned.  3. CAD:  No angina.  He is not on aspirin as he is on Apixaban. Continue statin.  4. Hypertension:  Controlled. 5. Hyperlipidemia:  Continue statin. 6. Carotid Stenosis:  Follow up US due in 10/2013. 7. Disposition:  Follow up with Dr. Antoine Poche 04/17/2013 as planned.  Signed, Tereso Newcomer, PA-C  03/21/2013 9:02 AM

## 2013-03-21 NOTE — Patient Instructions (Signed)
HOLD DIGOXIN; PER SCOTT WEAVER, PAC  OK TO TAKE 1/2 TABLET ONLY IF HEART RATE IS ABOVE 100; IF YOU DO THIS PLEASE CALL THE OFFICE TO LET us KNOW; [phone]  DECREASE DILTIAZEM TO 120 MG TWICE DAILY  NEED TO HAVE YOU WEAR AN EVENT MONITOR FOR 21 DAYS DX [phone]  MAKE SURE TO KEEP YOUR FOLLOW UP WITH DR. HOCHREIN 04/27/13; WITH LAB WORK SAME DAY BMET, CBC W/DIFF

## 2013-03-25 ENCOUNTER — Encounter: Payer: Medicare Other | Admitting: Physician Assistant

## 2013-03-27 ENCOUNTER — Encounter (INDEPENDENT_AMBULATORY_CARE_PROVIDER_SITE_OTHER): Payer: Medicare Other

## 2013-03-27 ENCOUNTER — Encounter: Payer: Self-pay | Admitting: *Deleted

## 2013-03-27 ENCOUNTER — Other Ambulatory Visit: Payer: Self-pay

## 2013-03-27 DIAGNOSIS — I4891 Unspecified atrial fibrillation: Secondary | ICD-10-CM

## 2013-03-27 MED ORDER — CLONIDINE HCL 0.3 MG PO TABS: 0.3000 mg | ORAL_TABLET | Freq: Two times a day (BID) | ORAL | Status: DC

## 2013-03-27 NOTE — Telephone Encounter (Signed)
Yes please send to Dr. Felicity Coyer for verification

## 2013-03-27 NOTE — Telephone Encounter (Signed)
PA has been approved. Pharmacy notified & pt already pick med up...Raechel Chute

## 2013-03-27 NOTE — Telephone Encounter (Signed)
Erin with [facility] left v/m requesting refill clonidine 0.3 mg but Denny Peon needs verification of how pt should be taking; [facility] had pt taking 2 tabs twice a day; pt told Denny Peon pt is taking 1 tab twice a day. Med list has take 1 tab twice a day and take 0.3 mg in AM and 0.6mg  at night.Please advise. Should this be sent to Dr Felicity Coyer?

## 2013-03-27 NOTE — Telephone Encounter (Signed)
Thanks for the message. I have updated the medication on the list to reflect one tablet twice daily

## 2013-03-27 NOTE — Addendum Note (Signed)
Addended by: Rene Paci A on: 03/27/2013 09:38 PM   Modules accepted: Orders

## 2013-03-27 NOTE — Progress Notes (Signed)
Patient ID: [name]., male   DOB: 1930/07/05, 79 y.o.   MRN: [id number] E-Cardio verite 30 day cardiac event monitor applied to patient.

## 2013-03-27 NOTE — Telephone Encounter (Signed)
Called pt to clarify spoke woth his wife. She states pt hasn't been taking. When he was in the hospital was told to start back but need to take just 1 twice a day. I ask her which md change it  She said she think Dr. Antoine Poche, she was not at home but will give me a call back to inform...Raechel Chute

## 2013-03-28 ENCOUNTER — Ambulatory Visit (INDEPENDENT_AMBULATORY_CARE_PROVIDER_SITE_OTHER): Payer: Medicare Other | Admitting: Internal Medicine

## 2013-03-28 ENCOUNTER — Encounter: Payer: Self-pay | Admitting: Internal Medicine

## 2013-03-28 ENCOUNTER — Telehealth: Payer: Self-pay | Admitting: Cardiology

## 2013-03-28 VITALS — BP 180/72 | HR 64 | Temp 98.2°F | Wt 158.1 lb

## 2013-03-28 DIAGNOSIS — I10 Essential (primary) hypertension: Secondary | ICD-10-CM

## 2013-03-28 DIAGNOSIS — I4891 Unspecified atrial fibrillation: Secondary | ICD-10-CM

## 2013-03-28 MED ORDER — CLONIDINE HCL 0.3 MG PO TABS: 0.3000 mg | ORAL_TABLET | Freq: Two times a day (BID) | ORAL | Status: DC

## 2013-03-28 MED ORDER — HYDROCHLOROTHIAZIDE 25 MG PO TABS: 25.0000 mg | ORAL_TABLET | Freq: Every day | ORAL | Status: DC

## 2013-03-28 NOTE — Patient Instructions (Addendum)
It was good to see you today.  We have reviewed your prior records including labs and tests today  Medications reviewed and updated:  Resume clonidine twice daily Stopped Lasix because of itch Start hydrochlorothiazide 25 mg daily for fluid and blood pressure  Your prescription(s) have been submitted to your pharmacy. Please take as directed and contact our office if you believe you are having problem(s) with the medication(s).  Please keep schedule followup in 6 weeks for recheck, call sooner if problems.  if your symptoms continue to worsen (pain, fever, shortness of breath, etc), or if you are unable take anything by mouth (pills, fluids, etc), you should go to the emergency room for further evaluation and treatment.  Hypertension Hypertension is another name for high blood pressure. High blood pressure may mean that your heart needs to work harder to pump blood. Blood pressure consists of two numbers, which includes a higher number over a lower number (example: 110/72). HOME CARE   Make lifestyle changes as told by your doctor. This may include weight loss and exercise.  Take your blood pressure medicine every day.  Limit how much salt you use.  Stop smoking if you smoke.  Do not use drugs.  Talk to your doctor if you are using decongestants or birth control pills. These medicines might make blood pressure higher.  Females should not drink more than 1 alcoholic drink per day. Males should not drink more than 2 alcoholic drinks per day.  See your doctor as told. GET HELP RIGHT AWAY IF:   You have a blood pressure reading with a top number of 180 or higher.  You get a very bad headache.  You get blurred or changing vision.  You feel confused.  You feel weak, numb, or faint.  You get chest or belly (abdominal) pain.  You throw up (vomit).  You cannot breathe very well. MAKE SURE YOU:   Understand these instructions.  Will watch your condition.  Will get help  right away if you are not doing well or get worse. Document Released: 09/13/2007 Document Revised: 06/19/2011 Document Reviewed: 09/13/2007 [facility] Patient Information 2014 [place], [place].

## 2013-03-28 NOTE — Assessment & Plan Note (Signed)
Temporarily stopped amlodipine early 10/2011 due to mild dependant edema, but then resumed started ARB 10/2011 and titrated to max Improved BP ---until exacerbation by hosp for rapid A. Fib November 2014 Amlodipine discontinued in place of diltiazem 04/2012 hosp for RVR Reviewed episode of bradycardia during 02/2013 hospitalization and cards visit 03/2013 - off Dig and dec Dilt because of same Resume clonidine now, dose clarified- sudden DC of same likely contributing to spiked BP Also reports itching due to Lasix, stopped same and resume prior hydrochlorothiazide patient and wife will monitor at home for mgmt of same   BP Readings from Last 3 Encounters:  03/28/13 180/72  03/21/13 140/58  03/19/13 152/84

## 2013-03-28 NOTE — Assessment & Plan Note (Signed)
hosp for same 02/2013 and again 03/2013 - reviewed highly symptomatic RVR - fatigue and dyspnea - but improving with normalizing HR On 30d monitor now - consider tykosin if needed as other med options limitied (cards consult reviewed) reviewed current dilt dose: 120 mg bid Also on Zebeta - avoid other beta-blocker due to adv pulm disease using dig 0.125 if HR>100 (given lack of other med choices) conitnue follow up closely with cards - continue anticoag - eliquis - in place of prior plavix [id number] (since 03/10/13 DC) Echo 03/09/13: normal LV fx, no cor pulmonale; CT angio neg for PE and no hypoxia follow up cards as planned

## 2013-03-28 NOTE — Progress Notes (Signed)
Pre-visit discussion using our clinic review tool. No additional management support is needed unless otherwise documented below in the visit note.  

## 2013-03-28 NOTE — Telephone Encounter (Signed)
New Problem:  Pt's wife states her husband's BP was 201/101 this morning. She states he has been out of his BP meds for two weeks. She states they haven't been able to get [facility] to refill it. She wants to know if Dr. Antoine Poche will.

## 2013-03-28 NOTE — Telephone Encounter (Signed)
Per [name] - pt has not had his Clonidine 0.3 mg in 2 weeks.  He takes it was listed BID.  Rx sent into pharmacy.

## 2013-03-28 NOTE — Progress Notes (Signed)
  Subjective:    Patient ID: Stephen Benton., male    DOB: [birth year], 79 y.o.   MRN: [id number]  HPI  Here for follow up - symptomatic rapid A. Fib, HTN Interval medical events and cardiology consultation reviewed  Also reviewed chronic medical issues today  Past Medical History  Diagnosis Date  . CORONARY HEART DISEASE     a. s/p CABG;  b.  cath 06/27/10: S-Dx occluded, S-PDA 80-90% (tx with PCI); S-OM ok, L-LAD ok;  EF 65-70%  c.  s/p Promus DES to S-PDA 06/2010;   d. Myoview 8/12: low risk  . FIBRILLATION, ATRIAL     post op; ?documented during hosp. 06/2010  . SLEEP APNEA 09/2001    NPSG AHI 22/HR  . ALLERGIC RHINITIS   . ASTHMA   . Depression   . GERD     with HH, hx esophageal stricture  . DYSLIPIDEMIA   . HYPERTENSION     Echo 3/12: EF 55-60%; mod LVH; mild AS/AI; LAE; PASP 38; mild pulmo HTN  . Personal history of alcoholism   . Diverticulosis   . Benign liver cyst   . Skin cancer     L forearm  . Cataract     surgery to both eyes  . Esophageal stricture   . Hiatal hernia     Review of Systems  Constitutional: Positive for fatigue. Negative for fever and unexpected weight change.  Respiratory: Positive for cough (dry). Negative for wheezing.   Cardiovascular: Positive for leg swelling (improving). Negative for chest pain and palpitations.  Gastrointestinal: Negative for nausea, vomiting, abdominal pain and constipation.  Musculoskeletal: Negative for arthralgias and myalgias.  Neurological: Negative for dizziness, weakness and light-headedness.      Objective:   Physical Exam BP 180/72  Pulse 64  Temp(Src) 98.2 F (36.8 C) (Oral)  Wt 158 lb 1.9 oz (71.723 kg)  SpO2 97% Wt Readings from Last 3 Encounters:  03/28/13 158 lb 1.9 oz (71.723 kg)  03/21/13 162 lb (73.483 kg)  03/19/13 162 lb 12.8 oz (73.846 kg)   Constitutional:  He is short and overweight, appears well-developed and well-nourished. No distress. Wife at side Cardiovascular: Irregular rate  and rhythm. Normal rate.  No murmur heard. 1+ mild pitting BLE edema Pulmonary/Chest: resting mild dyspnea, worse with conversation - but at baseline. No respiratory distress. No wheeze, fair air movement B.  Skin: Solar and senile changes, chronic hemochromatic and tattooed forearms. Skin is warm and dry.  No erythema or ulceration.  Psychiatric: he has a normal mood and affect. behavior is normal. Judgment and thought content normal.      Lab Results  Component Value Date   WBC 9.7 03/14/2013   HGB 14.3 03/14/2013   HCT 42.4 03/14/2013   PLT 215 03/14/2013   CHOL 123 10/16/2012   TRIG 111.0 10/16/2012   HDL 41.10 10/16/2012   ALT 11 03/10/2013   AST 14 03/10/2013   NA 138 03/14/2013   K 4.1 03/14/2013   CL 101 03/14/2013   CREATININE 1.10 03/14/2013   BUN 18 03/14/2013   CO2 26 03/14/2013   TSH 1.170 03/09/2013   PSA 0.62 10/10/2010   INR 1.40 03/14/2013   HGBA1C 6.4 10/16/2012    Assessment & Plan:   See problem list. Medications and labs reviewed today.

## 2013-04-01 ENCOUNTER — Other Ambulatory Visit: Payer: Self-pay | Admitting: Internal Medicine

## 2013-04-05 ENCOUNTER — Other Ambulatory Visit: Payer: Self-pay | Admitting: Internal Medicine

## 2013-04-08 ENCOUNTER — Other Ambulatory Visit: Payer: Self-pay | Admitting: *Deleted

## 2013-04-08 MED ORDER — ALBUTEROL SULFATE HFA 108 (90 BASE) MCG/ACT IN AERS: 2.0000 | INHALATION_SPRAY | RESPIRATORY_TRACT | Status: DC | PRN

## 2013-04-10 ENCOUNTER — Other Ambulatory Visit (HOSPITAL_COMMUNITY): Payer: Self-pay | Admitting: Internal Medicine

## 2013-04-11 ENCOUNTER — Ambulatory Visit (INDEPENDENT_AMBULATORY_CARE_PROVIDER_SITE_OTHER): Payer: Medicare Other | Admitting: Internal Medicine

## 2013-04-11 ENCOUNTER — Encounter: Payer: Self-pay | Admitting: Internal Medicine

## 2013-04-11 VITALS — BP 128/76 | HR 68 | Temp 97.1°F | Resp 16 | Wt 167.0 lb

## 2013-04-11 DIAGNOSIS — I10 Essential (primary) hypertension: Secondary | ICD-10-CM

## 2013-04-11 MED ORDER — CLONIDINE HCL 0.3 MG/24HR TD PTWK: 0.3000 mg | MEDICATED_PATCH | TRANSDERMAL | Status: DC

## 2013-04-11 NOTE — Progress Notes (Signed)
Pre visit review using our clinic review tool, if applicable. No additional management support is needed unless otherwise documented below in the visit note. 

## 2013-04-11 NOTE — Progress Notes (Signed)
Subjective:    Patient ID: Stephen Door., male    DOB: 11-29-1930, 80 y.o.   MRN: [id number]  HPI Comments: He returns and tells me that he is concerned that his blood pressure is "fluctuating" - he feels like his BP is very high (SBP up to about 170-180) in the morning before he takes clonidine, then later in the BP is pretty well controlled until night before he takes the clonidine and his BP spikes up again. He has lower leg edema that is at his baseline level.    Hypertension This is a chronic problem. The current episode started more than 1 year ago. The problem is unchanged. The problem is uncontrolled. Associated symptoms include anxiety and peripheral edema. Pertinent negatives include no blurred vision, chest pain, headaches, malaise/fatigue, neck pain, orthopnea, palpitations, PND, shortness of breath or sweats. Past treatments include angiotensin blockers, diuretics, beta blockers and central alpha agonists. Hypertensive end-organ damage includes CAD/MI and heart failure.      Review of Systems  Constitutional: Negative.  Negative for fever, chills, malaise/fatigue, diaphoresis, appetite change and fatigue.  HENT: Negative.   Eyes: Negative.  Negative for blurred vision.  Respiratory: Negative.  Negative for apnea, cough, choking, chest tightness, shortness of breath, wheezing and stridor.   Cardiovascular: Negative.  Negative for chest pain, palpitations, orthopnea, leg swelling and PND.  Gastrointestinal: Negative.  Negative for nausea, vomiting, abdominal pain, diarrhea and constipation.  Endocrine: Negative.   Genitourinary: Negative.   Musculoskeletal: Negative.  Negative for neck pain.  Skin: Negative.   Allergic/Immunologic: Negative.   Neurological: Negative.  Negative for dizziness, tremors, syncope, weakness, light-headedness and headaches.  Hematological: Negative.  Negative for adenopathy.  Psychiatric/Behavioral: Negative.        Objective:   Physical  Exam  Vitals reviewed. Constitutional: He is oriented to person, place, and time. He appears well-developed and well-nourished. No distress.  HENT:  Head: Normocephalic and atraumatic.  Mouth/Throat: Oropharynx is clear and moist. No oropharyngeal exudate.  Eyes: Conjunctivae are normal. Right eye exhibits no discharge. Left eye exhibits no discharge. No scleral icterus.  Neck: Normal range of motion. Neck supple. No JVD present. No tracheal deviation present. No thyromegaly present.  Cardiovascular: Normal rate, regular rhythm, normal heart sounds and intact distal pulses.  Exam reveals no gallop and no friction rub.   No murmur heard. Pulmonary/Chest: Effort normal and breath sounds normal. No stridor. No respiratory distress. He has no wheezes. He has no rales. He exhibits no tenderness.  Abdominal: Soft. Bowel sounds are normal. He exhibits no distension and no mass. There is no tenderness. There is no rebound and no guarding.  Musculoskeletal: Normal range of motion. He exhibits edema. He exhibits no tenderness.  Lymphadenopathy:    He has no cervical adenopathy.  Neurological: He is oriented to person, place, and time.  Skin: Skin is warm and dry. No rash noted. He is not diaphoretic. No erythema. No pallor.  Psychiatric: He has a normal mood and affect. His behavior is normal. Judgment and thought content normal.     Lab Results  Component Value Date   WBC 9.7 03/14/2013   HGB 14.3 03/14/2013   HCT 42.4 03/14/2013   PLT 215 03/14/2013   GLUCOSE 123* 03/14/2013   CHOL 123 10/16/2012   TRIG 111.0 10/16/2012   HDL 41.10 10/16/2012   LDLCALC 60 10/16/2012   ALT 11 03/10/2013   AST 14 03/10/2013   NA 138 03/14/2013   K 4.1 03/14/2013  CL 101 03/14/2013   CREATININE 1.10 03/14/2013   BUN 18 03/14/2013   CO2 26 03/14/2013   TSH 1.170 03/09/2013   PSA 0.62 10/10/2010   INR 1.40 03/14/2013   HGBA1C 6.4 10/16/2012       Assessment & Plan:

## 2013-04-11 NOTE — Assessment & Plan Note (Signed)
I think a clonidine patch may give him better/smoother BP control so I have asked him to make that change He will stay on the other meds with no changes

## 2013-04-11 NOTE — Patient Instructions (Signed)

## 2013-04-17 ENCOUNTER — Other Ambulatory Visit: Payer: Medicare Other

## 2013-04-17 ENCOUNTER — Ambulatory Visit (INDEPENDENT_AMBULATORY_CARE_PROVIDER_SITE_OTHER): Payer: Medicare Other | Admitting: Cardiology

## 2013-04-17 ENCOUNTER — Encounter: Payer: Self-pay | Admitting: Cardiology

## 2013-04-17 VITALS — BP 115/60 | HR 59 | Ht 62.0 in | Wt 166.0 lb

## 2013-04-17 DIAGNOSIS — I4891 Unspecified atrial fibrillation: Secondary | ICD-10-CM

## 2013-04-17 DIAGNOSIS — I10 Essential (primary) hypertension: Secondary | ICD-10-CM

## 2013-04-17 LAB — CBC WITH DIFFERENTIAL/PLATELET
BASOS ABS: 0 10*3/uL (ref 0.0–0.1)
BASOS PCT: 0.3 % (ref 0.0–3.0)
EOS ABS: 0.1 10*3/uL (ref 0.0–0.7)
Eosinophils Relative: 1.5 % (ref 0.0–5.0)
HCT: 31.9 % — ABNORMAL LOW (ref 39.0–52.0)
Hemoglobin: 10.8 g/dL — ABNORMAL LOW (ref 13.0–17.0)
LYMPHS PCT: 26.1 % (ref 12.0–46.0)
Lymphs Abs: 1.9 10*3/uL (ref 0.7–4.0)
MCHC: 33.8 g/dL (ref 30.0–36.0)
MCV: 84 fl (ref 78.0–100.0)
MONO ABS: 0.7 10*3/uL (ref 0.1–1.0)
Monocytes Relative: 9.1 % (ref 3.0–12.0)
NEUTROS PCT: 63 % (ref 43.0–77.0)
Neutro Abs: 4.6 10*3/uL (ref 1.4–7.7)
PLATELETS: 235 10*3/uL (ref 150.0–400.0)
RBC: 3.8 Mil/uL — AB (ref 4.22–5.81)
RDW: 15.1 % — AB (ref 11.5–14.6)
WBC: 7.3 10*3/uL (ref 4.5–10.5)

## 2013-04-17 LAB — BASIC METABOLIC PANEL
BUN: 17 mg/dL (ref 6–23)
CALCIUM: 8.6 mg/dL (ref 8.4–10.5)
CO2: 27 mEq/L (ref 19–32)
CREATININE: 1.2 mg/dL (ref 0.4–1.5)
Chloride: 103 mEq/L (ref 96–112)
GFR: 63.11 mL/min (ref >60.00)
GLUCOSE: 94 mg/dL (ref 70–99)
Potassium: 4.2 mEq/L (ref 3.5–5.1)
Sodium: 136 mEq/L (ref 135–145)

## 2013-04-17 MED ORDER — DILTIAZEM HCL ER COATED BEADS 180 MG PO CP24: ORAL_CAPSULE | ORAL | Status: DC

## 2013-04-17 MED ORDER — CLONIDINE HCL 0.3 MG PO TABS: 0.3000 mg | ORAL_TABLET | Freq: Two times a day (BID) | ORAL | Status: DC

## 2013-04-17 MED ORDER — TORSEMIDE 10 MG PO TABS: 10.0000 mg | ORAL_TABLET | Freq: Once | ORAL | Status: DC

## 2013-04-17 NOTE — Progress Notes (Signed)
HPI The patient presents for follow up of  CAD, s/p CABG, s/p Promus DES to S-PDA in 06/2010.   He was admitted in Nov with tachycardia and AFib.  He was placed on Eliquis.   Is having problems with his blood pressure being difficult to control. However, he's not able to afford Catapres patches. He denies any new palpitations, presyncope or syncope. He thinks his blood pressure frequently and he's not noticing the heart rate going up. He denies any new shortness of breath, PND or orthopnea. He's had no weight gain but he does have some lower extremity edema.     Allergies  Allergen Reactions  . Codeine Nausea And Vomiting  . Lasix [Furosemide] Itching    Current Outpatient Prescriptions  Medication Sig Dispense Refill  . acidophilus (RISAQUAD) CAPS Take 1 capsule by mouth daily. [facility] probiotics      . albuterol (PROAIR HFA) 108 (90 BASE) MCG/ACT inhaler Inhale 2 puffs into the lungs every 4 (four) hours as needed for wheezing or shortness of breath.  1 Inhaler  3  . albuterol (PROVENTIL HFA;VENTOLIN HFA) 108 (90 BASE) MCG/ACT inhaler Inhale 1-2 puffs into the lungs every 6 (six) hours as needed for wheezing or shortness of breath.      . bisoprolol (ZEBETA) 10 MG tablet Take 1 tablet (10 mg total) by mouth daily.  30 tablet  5  . budesonide (PULMICORT) 0.25 MG/2ML nebulizer solution Take 0.25 mg by nebulization 2 (two) times daily as needed. For asthma      . calcium-vitamin D (OSCAL WITH D) 500-200 MG-UNIT per tablet Take 1 tablet by mouth 2 (two) times daily.      . cetaphil (CETAPHIL) lotion Apply 1 application topically as needed for dry skin.      . cloNIDine (CATAPRES - DOSED IN MG/24 HR) 0.3 mg/24hr patch Place 1 patch (0.3 mg total) onto the skin once a week.  4 patch  12  . denosumab (PROLIA) 60 MG/ML SOLN injection Inject 60 mg into the skin every 6 (six) months. Administer in upper arm, thigh, or abdomen  1 mL  5  . diltiazem (CARDIZEM CD) 120 MG 24 hr capsule 1 cap twice daily       . ELIQUIS 5 MG TABS tablet take 1 tablet by mouth twice a day  60 tablet  0  . famotidine (PEPCID) 20 MG tablet Take 20 mg by mouth at bedtime.      . hydrochlorothiazide (HYDRODIURIL) 25 MG tablet Take 1 tablet (25 mg total) by mouth daily.  30 tablet  11  . ipratropium-albuterol (DUONEB) 0.5-2.5 (3) MG/3ML SOLN Take 3 mLs by nebulization every 6 (six) hours as needed (for shortness of breath).      Marland Kitchen. ketoconazole (NIZORAL) 2 % cream Apply topically 2 (two) times daily. To soles of feet and between toes  30 g  0  . latanoprost (XALATAN) 0.005 % ophthalmic solution Place 1 drop into both eyes at bedtime.       Marland Kitchen. loperamide (IMODIUM A-D) 2 MG tablet Take 2 mg by mouth as needed. For loose stool      . LORazepam (ATIVAN) 0.5 MG tablet Take 0.5 mg by mouth 2 (two) times daily.      Marland Kitchen. losartan (COZAAR) 100 MG tablet Take 100 mg by mouth daily.      . mometasone-formoterol (DULERA) 100-5 MCG/ACT AERO Inhale 2 puffs into the lungs 2 (two) times daily.      . montelukast (SINGULAIR) 10  MG tablet take 1 tablet by mouth at bedtime  30 tablet  5  . nitroGLYCERIN (NITROSTAT) 0.4 MG SL tablet Place 0.4 mg under the tongue every 5 (five) minutes as needed for chest pain.      . potassium chloride SA (K-DUR,KLOR-CON) 20 MEQ tablet Take 20 mEq by mouth 2 (two) times daily.      . pravastatin (PRAVACHOL) 80 MG tablet Take 80 mg by mouth daily.      . promethazine (PHENERGAN) 25 MG tablet Take 1 tablet (25 mg total) by mouth every 6 (six) hours as needed for nausea or vomiting.  30 tablet  0   No current facility-administered medications for this visit.    Past Medical History  Diagnosis Date  . CORONARY HEART DISEASE     a. s/p CABG;  b.  cath 06/27/10: S-Dx occluded, S-PDA 80-90% (tx with PCI); S-OM ok, L-LAD ok;  EF 65-70%  c.  s/p Promus DES to S-PDA 06/2010;   d. Myoview 8/12: low risk  . FIBRILLATION, ATRIAL     post op; ?documented during hosp. 06/2010  . SLEEP APNEA 09/2001    NPSG AHI 22/HR  .  ALLERGIC RHINITIS   . ASTHMA   . Depression   . GERD     with HH, hx esophageal stricture  . DYSLIPIDEMIA   . HYPERTENSION     Echo 3/12: EF 55-60%; mod LVH; mild AS/AI; LAE; PASP 38; mild pulmo HTN  . Personal history of alcoholism   . Diverticulosis   . Benign liver cyst   . Skin cancer     L forearm  . Cataract     surgery to both eyes  . Esophageal stricture   . Hiatal hernia     Past Surgical History  Procedure Laterality Date  . Coronary artery bypass graft  02/2007  . Hernia repair  12/03/07  . Tonsillectomy    . Coronary angioplasty with stent placement  07/2010  . Cataract extraction, bilateral  2007  . Hernia repair  unsure ?60's  . Cholecystectomy  02/21/2011    Procedure: LAPAROSCOPIC CHOLECYSTECTOMY WITH INTRAOPERATIVE CHOLANGIOGRAM;  Surgeon: Pedro Earls, MD;  Location: [facility];  Service: General;  Laterality: N/A;    ROS:  As stated in the HPI and negative for all other systems.  PHYSICAL EXAM BP 115/60  Pulse 59  Ht 5\' 2"  (1.575 m)  Wt 166 lb (75.297 kg)  BMI 30.35 kg/m2 GENERAL:  Chronically ill appearing but in no acute distress HEENT:  Pupils equal round and reactive, fundi not visualized, oral mucosa unremarkable, dentures NECK:  No jugular venous distention, waveform within normal limits, carotid upstroke brisk and symmetric, left carotid bruits, no thyromegaly LYMPHATICS:  No cervical, inguinal adenopathy LUNGS:  Clear to auscultation bilaterally BACK:  No CVA tenderness CHEST:  Well healed sternotomy scar. HEART:  PMI not displaced or sustained,S1 and S2 within normal limits, no S3, no S4, no clicks, no rubs, soft apical, right upper sterna border early peaking murmur ABD:  Flat, positive bowel sounds normal in frequency in pitch, no bruits, no rebound, no guarding, no midline pulsatile mass, no hepatomegaly, no splenomegaly, healed abdominal scars. EXT:  2 plus pulses throughout, moderate bilateral edema, no cyanosis no clubbing SKIN:  No  rashes no nodules, multiple bruises NEURO:  Cranial nerves II through XII grossly intact, motor grossly intact throughout PSYCH:  Cognitively intact, oriented to person place and time   ASSESSMENT AND PLAN   CAD:  The  patient has no new sypmtoms.  No further cardiovascular testing is indicated.  We will continue with aggressive risk reduction and meds as listed.  ATRIAL FIBRILLATION:    He will continue with the meds as listed.  However, if he is unable to afford this in the future with his extensive medication list which was switched to warfarin.  HTN: His blood pressure is difficult to control and he cannot afford clonidine patches. I will switch him back to clonidine by mouth and I will increase his Cardizem.  EDEMA:  He does have some increased lower extremity edema. I'm going to give him just 2 days of torsemide.00 a sodium diagnostic BX notices it probably had in the past as well and a pressure 120

## 2013-04-17 NOTE — Patient Instructions (Addendum)
Please stop Clonidine patches Start Clonidine 0.3 mg one twice a day Increase Cardizem to 180 mg twice a day. Take Demadex 10 mg for 2 days only.  Continue all other medications as listed.  Follow up with Richardson Dopp, PA in 1 month.

## 2013-04-18 ENCOUNTER — Ambulatory Visit: Payer: Medicare Other | Admitting: Internal Medicine

## 2013-04-18 ENCOUNTER — Telehealth: Payer: Self-pay | Admitting: *Deleted

## 2013-04-18 DIAGNOSIS — D649 Anemia, unspecified: Secondary | ICD-10-CM

## 2013-04-18 DIAGNOSIS — I4891 Unspecified atrial fibrillation: Secondary | ICD-10-CM

## 2013-04-18 NOTE — Telephone Encounter (Signed)
I s/w pt on the home #, pt states due to hard of hearing he asked for me to s/w his wife about results, she will not be home until about 5:30. I did lmom on wife's cell as well.

## 2013-04-18 NOTE — Telephone Encounter (Signed)
wife notified about lab results and to will have pt come in 1/12 for lab and pick up stool card at front desk. Wife verbalized understanding to Plan of Care

## 2013-04-21 ENCOUNTER — Telehealth: Payer: Self-pay | Admitting: *Deleted

## 2013-04-21 ENCOUNTER — Ambulatory Visit (INDEPENDENT_AMBULATORY_CARE_PROVIDER_SITE_OTHER): Payer: Medicare Other | Admitting: *Deleted

## 2013-04-21 DIAGNOSIS — D649 Anemia, unspecified: Secondary | ICD-10-CM

## 2013-04-21 DIAGNOSIS — D509 Iron deficiency anemia, unspecified: Secondary | ICD-10-CM

## 2013-04-21 LAB — CBC WITH DIFFERENTIAL/PLATELET
BASOS ABS: 0 10*3/uL (ref 0.0–0.1)
Basophils Relative: 0.3 % (ref 0.0–3.0)
Eosinophils Absolute: 0.1 10*3/uL (ref 0.0–0.7)
Eosinophils Relative: 0.7 % (ref 0.0–5.0)
HEMATOCRIT: 33.5 % — AB (ref 39.0–52.0)
Hemoglobin: 11.3 g/dL — ABNORMAL LOW (ref 13.0–17.0)
LYMPHS ABS: 2.1 10*3/uL (ref 0.7–4.0)
Lymphocytes Relative: 27.3 % (ref 12.0–46.0)
MCHC: 33.6 g/dL (ref 30.0–36.0)
MCV: 84 fl (ref 78.0–100.0)
MONOS PCT: 8 % (ref 3.0–12.0)
Monocytes Absolute: 0.6 10*3/uL (ref 0.1–1.0)
NEUTROS PCT: 63.7 % (ref 43.0–77.0)
Neutro Abs: 4.9 10*3/uL (ref 1.4–7.7)
PLATELETS: 225 10*3/uL (ref 150.0–400.0)
RBC: 3.99 Mil/uL — ABNORMAL LOW (ref 4.22–5.81)
RDW: 15 % — AB (ref 11.5–14.6)
WBC: 7.7 10*3/uL (ref 4.5–10.5)

## 2013-04-21 LAB — FERRITIN: Ferritin: 17.1 ng/mL — ABNORMAL LOW (ref 22.0–322.0)

## 2013-04-21 LAB — IRON: IRON: 37 ug/dL — AB (ref 42–165)

## 2013-04-21 LAB — IBC PANEL
IRON: 37 ug/dL — AB (ref 42–165)
Saturation Ratios: 9.9 % — ABNORMAL LOW (ref 20.0–50.0)
TRANSFERRIN: 266.6 mg/dL (ref 212.0–360.0)

## 2013-04-21 MED ORDER — FERROUS SULFATE 325 (65 FE) MG PO TABS: 325.0000 mg | ORAL_TABLET | Freq: Three times a day (TID) | ORAL | Status: DC

## 2013-04-21 NOTE — Telephone Encounter (Signed)
wife notified about lab results for pt and to have pt start on FE 325 TID, advised pt has to do the stool cards 1st then start the FE supp. Repeat cbc 05/02/13. Refer to GI, pt req. Dr. Henrene Pastor

## 2013-04-24 ENCOUNTER — Other Ambulatory Visit: Payer: Self-pay | Admitting: Internal Medicine

## 2013-04-24 ENCOUNTER — Other Ambulatory Visit: Payer: Self-pay

## 2013-04-24 ENCOUNTER — Telehealth: Payer: Self-pay | Admitting: Cardiology

## 2013-04-24 MED ORDER — FERROUS SULFATE 325 (65 FE) MG PO TABS: 325.0000 mg | ORAL_TABLET | Freq: Three times a day (TID) | ORAL | Status: DC

## 2013-04-24 NOTE — Telephone Encounter (Deleted)
Error

## 2013-04-25 ENCOUNTER — Telehealth: Payer: Self-pay | Admitting: Cardiology

## 2013-04-25 NOTE — Telephone Encounter (Signed)
Needed to know where to take stool cards.  Advised to bring to our office here or take to the [facility] office.  Rod Holler stated understanding.

## 2013-04-25 NOTE — Telephone Encounter (Signed)
New message           Pt wants to know where to send his stool test

## 2013-04-28 ENCOUNTER — Telehealth: Payer: Self-pay | Admitting: Cardiology

## 2013-04-28 NOTE — Telephone Encounter (Signed)
Spoke with wife and pt at length about diet, water intake, daily wts, edema, BP and HR.  Reviewed with both the importance of watching the NA intake and while the wife seems to have good control over his diet she was not aware of that foods do have naturally occuring amounts of NA whether you add salt to them or not.  Advised to keep pt under 2 gm a day.  Pt is weighing daily.  He reports his weigh goes up sometimes as much as 5 lbs in a day but returns to normal by the next morning.  Advised that he should weigh daily in the AM and if the weight has gone up 2 lbs over night or 5 lbs in 3 days they should call us.   Pt reports edema remains.  It is not any worse than it has been.  States every day his feet become swollen despite whether he keeps them up during the day or not.  Advised that when they do not go down over night - they should call us.  Pt reports his BP is going up and down.  States BP is as high as 167/91.  Advised since his Cardizem was just increased at last office visit he should continue to monitor it but not to make any changes in his medications at this point.  He states understanding.  Also advised as long as BP is less than 200/100 and HR less than 100 he should not get upset and keep re-checking it as this will only cause it to increase more.  Pt will keep appts as scheduled for now.  They will call back if further concerns or questions.

## 2013-04-28 NOTE — Telephone Encounter (Signed)
New message         Pt is afib and has swelling in his feet and irregular heart rate. I talked with Pam to take a message.

## 2013-04-29 ENCOUNTER — Ambulatory Visit (INDEPENDENT_AMBULATORY_CARE_PROVIDER_SITE_OTHER): Payer: Medicare Other | Admitting: Internal Medicine

## 2013-04-29 ENCOUNTER — Encounter: Payer: Self-pay | Admitting: Internal Medicine

## 2013-04-29 VITALS — BP 148/80 | HR 78 | Ht 62.0 in | Wt 167.0 lb

## 2013-04-29 DIAGNOSIS — K219 Gastro-esophageal reflux disease without esophagitis: Secondary | ICD-10-CM

## 2013-04-29 DIAGNOSIS — D509 Iron deficiency anemia, unspecified: Secondary | ICD-10-CM

## 2013-04-29 DIAGNOSIS — R197 Diarrhea, unspecified: Secondary | ICD-10-CM

## 2013-04-29 MED ORDER — OMEPRAZOLE 20 MG PO CPDR
20.0000 mg | DELAYED_RELEASE_CAPSULE | Freq: Every day | ORAL | Status: DC
Start: 2013-04-29 — End: 2014-01-04

## 2013-04-29 NOTE — Progress Notes (Signed)
HISTORY OF PRESENT ILLNESS:  [name]. is a 80 y.o. male , well-known to me, with [place] on multiple medications. He is sent today regarding anemia, possibly iron deficient. He is accompanied by his wife. I have seen the patient previously for evaluation of Hemoccult-positive stool, chronic diarrhea, and liver imaging questions. He was last seen in the office 10/04/2011 for followup regarding management of chronic diarrhea. He was placed on Questran with resolution of diarrhea. He has had significant decline in his overall health since his last visit. Hospitalizations for cardiac arrhythmias and heart failure. Now on eliquis for about one month. His hemoglobin last year was 12.1. In early December 14 0.3 with normal MCV. Early January 10 0.8 and most recently 11.3. Iron studies suggesting iron deficiency with low ferritin and saturation. Patient denies melena or hematochezia. No abdominal pain. No weight loss. Previous colonoscopy to evaluate Hemoccult-positive stool was performed 10/30/2007. Examination was normal except for left-sided diverticulosis. Adequate prep. Upper endoscopy at that time revealed a benign peptic stricture and sliding hiatal hernia. GI review of systems is otherwise remarkable for heartburn. He takes Pepcid 20 mg at night.  REVIEW OF SYSTEMS:  All non-GI ROS negative except for sinus and allergy trouble, anxiety, depression, fatigue, shortness of breath, irregular heart rate, hearing problems, bruising, lower extremity edema  Past Medical History  Diagnosis Date  . CORONARY HEART DISEASE     a. s/p CABG;  b.  cath 06/27/10: S-Dx occluded, S-PDA 80-90% (tx with PCI); S-OM ok, L-LAD ok;  EF 65-70%  c.  s/p Promus DES to S-PDA 06/2010;   d. Myoview 8/12: low risk  . FIBRILLATION, ATRIAL     post op; ?documented during hosp. 06/2010  . SLEEP APNEA 09/2001    NPSG AHI 22/HR  . ALLERGIC RHINITIS   . ASTHMA   . Depression   . GERD     with HH,  hx esophageal stricture  . DYSLIPIDEMIA   . HYPERTENSION     Echo 3/12: EF 55-60%; mod LVH; mild AS/AI; LAE; PASP 38; mild pulmo HTN  . Personal history of alcoholism   . Diverticulosis   . Benign liver cyst   . Skin cancer     L forearm  . Cataract     surgery to both eyes  . Esophageal stricture   . Hiatal hernia     Past Surgical History  Procedure Laterality Date  . Coronary artery bypass graft  02/2007  . Hernia repair  12/03/07  . Tonsillectomy    . Coronary angioplasty with stent placement  07/2010  . Cataract extraction, bilateral  2007  . Hernia repair  unsure ?60's  . Cholecystectomy  02/21/2011    Procedure: LAPAROSCOPIC CHOLECYSTECTOMY WITH INTRAOPERATIVE CHOLANGIOGRAM;  Surgeon: Pedro Earls, MD;  Location: [facility];  Service: General;  Laterality: N/A;    Social History [name]  reports that he quit smoking about 34 years ago. His smoking use included Cigarettes. He has a 120 pack-year smoking history. He has never used smokeless tobacco. He reports that he does not drink alcohol or use illicit drugs.  family history includes Asthma in his sister; COPD in his sister; Emphysema in his sister; Heart disease in his brother, father, and mother; Hypertension in his mother and sister. There is no history of Colon cancer.  Allergies  Allergen Reactions  . Codeine Nausea And Vomiting  . Lasix [Furosemide] Itching       PHYSICAL EXAMINATION:  Vital signs: BP 148/80  Pulse 78  Ht 5\' 2"  (1.575 m)  Wt 167 lb (75.751 kg)  BMI 30.54 kg/m2  Constitutional: Chronically ill-appearing, no acute distress Psychiatric: alert and oriented x3, cooperative Eyes: extraocular movements intact, anicteric, conjunctiva pink Mouth: oral pharynx moist, no lesions Neck: supple no lymphadenopathy Cardiovascular: heart regular rate and rhythm today, no murmur Lungs: clear to auscultation bilaterally Abdomen: soft, nontender, nondistended, no obvious ascites, no peritoneal  signs, normal bowel sounds, no organomegaly Rectal: Omitted Extremities: 2+ lower extremity edema bilaterally Skin: Ecchymosis on visible extremities Neuro: No focal deficits.   ASSESSMENT:  #56. 80 year old male with multiple significant medical problems as outlined. Worsening health over the past year. Set regarding mild normocytic anemia, possibly iron deficient. No clinically evident GI bleeding. Now on Eliquis. Prior colonoscopy and upper endoscopy 2009 as described #2. GERD. Active symptoms despite Pepcid #3. Multiple medical problems   PLAN:  #1. I discussed with him, and his wife, the pros and cons of endoscopic evaluations. I was quite candid regarding his high-risk nature of endoscopic evaluations (prep, sedation, instrumentation). At this point, I am not comfortable  proceeding with endoscopic evaluations due to the risk. I recognize that he could have developed  Interval colon cancer, and we discussed this, but hopefully less likely given negative colonoscopy in 2009. Furthermore, he would be an extremely poor surgical candidate at present. Fortunately, no acute bleeding. At this point I would recommend changing from Pepcid to omeprazole for better upper GI mucosal protection. As well, I agree with iron therapy and monitoring of hemoglobins. If he were to develop clinically relevant acute or subacute GI bleeding, then endoscopic evaluations of anticoagulation could be reconsidered.

## 2013-04-29 NOTE — Patient Instructions (Signed)
We have sent the following medications to your pharmacy for you to pick up at your convenience:  Omeprazole  Please follow up with Dr. Perry as needed   

## 2013-05-01 ENCOUNTER — Other Ambulatory Visit: Payer: Medicare Other

## 2013-05-04 ENCOUNTER — Encounter (HOSPITAL_COMMUNITY): Payer: Self-pay | Admitting: Emergency Medicine

## 2013-05-04 ENCOUNTER — Inpatient Hospital Stay (HOSPITAL_COMMUNITY)
Admission: EM | Admit: 2013-05-04 | Discharge: 2013-05-07 | DRG: 871 | Disposition: A | Discharge status: Home / Self Care | Payer: Medicare Other | Attending: Internal Medicine | Admitting: Internal Medicine

## 2013-05-04 ENCOUNTER — Emergency Department (HOSPITAL_COMMUNITY): Payer: Medicare Other

## 2013-05-04 DIAGNOSIS — J189 Pneumonia, unspecified organism: Secondary | ICD-10-CM

## 2013-05-04 DIAGNOSIS — Z87891 Personal history of nicotine dependence: Secondary | ICD-10-CM

## 2013-05-04 DIAGNOSIS — I2510 Atherosclerotic heart disease of native coronary artery without angina pectoris: Secondary | ICD-10-CM | POA: Diagnosis present

## 2013-05-04 DIAGNOSIS — K222 Esophageal obstruction: Secondary | ICD-10-CM

## 2013-05-04 DIAGNOSIS — R609 Edema, unspecified: Secondary | ICD-10-CM

## 2013-05-04 DIAGNOSIS — M79671 Pain in right foot: Secondary | ICD-10-CM | POA: Diagnosis present

## 2013-05-04 DIAGNOSIS — E876 Hypokalemia: Secondary | ICD-10-CM | POA: Diagnosis present

## 2013-05-04 DIAGNOSIS — R600 Localized edema: Secondary | ICD-10-CM

## 2013-05-04 DIAGNOSIS — I509 Heart failure, unspecified: Secondary | ICD-10-CM | POA: Diagnosis present

## 2013-05-04 DIAGNOSIS — K219 Gastro-esophageal reflux disease without esophagitis: Secondary | ICD-10-CM | POA: Diagnosis present

## 2013-05-04 DIAGNOSIS — Z85828 Personal history of other malignant neoplasm of skin: Secondary | ICD-10-CM

## 2013-05-04 DIAGNOSIS — M79609 Pain in unspecified limb: Secondary | ICD-10-CM

## 2013-05-04 DIAGNOSIS — J4489 Other specified chronic obstructive pulmonary disease: Secondary | ICD-10-CM | POA: Diagnosis present

## 2013-05-04 DIAGNOSIS — Z825 Family history of asthma and other chronic lower respiratory diseases: Secondary | ICD-10-CM

## 2013-05-04 DIAGNOSIS — Z8249 Family history of ischemic heart disease and other diseases of the circulatory system: Secondary | ICD-10-CM

## 2013-05-04 DIAGNOSIS — Z951 Presence of aortocoronary bypass graft: Secondary | ICD-10-CM

## 2013-05-04 DIAGNOSIS — J449 Chronic obstructive pulmonary disease, unspecified: Secondary | ICD-10-CM | POA: Diagnosis present

## 2013-05-04 DIAGNOSIS — J9620 Acute and chronic respiratory failure, unspecified whether with hypoxia or hypercapnia: Secondary | ICD-10-CM | POA: Diagnosis present

## 2013-05-04 DIAGNOSIS — Z23 Encounter for immunization: Secondary | ICD-10-CM

## 2013-05-04 DIAGNOSIS — I480 Paroxysmal atrial fibrillation: Secondary | ICD-10-CM

## 2013-05-04 DIAGNOSIS — I5032 Chronic diastolic (congestive) heart failure: Secondary | ICD-10-CM | POA: Diagnosis present

## 2013-05-04 DIAGNOSIS — E785 Hyperlipidemia, unspecified: Secondary | ICD-10-CM | POA: Diagnosis present

## 2013-05-04 DIAGNOSIS — I4891 Unspecified atrial fibrillation: Secondary | ICD-10-CM

## 2013-05-04 DIAGNOSIS — M79672 Pain in left foot: Secondary | ICD-10-CM

## 2013-05-04 DIAGNOSIS — J302 Other seasonal allergic rhinitis: Secondary | ICD-10-CM

## 2013-05-04 DIAGNOSIS — I5033 Acute on chronic diastolic (congestive) heart failure: Secondary | ICD-10-CM

## 2013-05-04 DIAGNOSIS — J3089 Other allergic rhinitis: Secondary | ICD-10-CM

## 2013-05-04 DIAGNOSIS — I10 Essential (primary) hypertension: Secondary | ICD-10-CM

## 2013-05-04 DIAGNOSIS — G4733 Obstructive sleep apnea (adult) (pediatric): Secondary | ICD-10-CM | POA: Diagnosis present

## 2013-05-04 DIAGNOSIS — A419 Sepsis, unspecified organism: Principal | ICD-10-CM

## 2013-05-04 DIAGNOSIS — Z79899 Other long term (current) drug therapy: Secondary | ICD-10-CM

## 2013-05-04 HISTORY — DX: Pneumonia, unspecified organism: J18.9

## 2013-05-04 LAB — COMPREHENSIVE METABOLIC PANEL
ALBUMIN: 3.7 g/dL (ref 3.5–5.2)
ALK PHOS: 91 U/L (ref 39–117)
ALT: 31 U/L (ref 0–53)
AST: 25 U/L (ref 0–37)
BUN: 14 mg/dL (ref 6–23)
CO2: 24 mEq/L (ref 19–32)
Calcium: 8.7 mg/dL (ref 8.4–10.5)
Chloride: 104 mEq/L (ref 96–112)
Creatinine, Ser: 0.9 mg/dL (ref 0.50–1.35)
GFR calc Af Amer: >90 mL/min (ref >90)
GFR calc non Af Amer: 78 mL/min — ABNORMAL LOW (ref >90)
Glucose, Bld: 104 mg/dL — ABNORMAL HIGH (ref 70–99)
POTASSIUM: 4.1 meq/L (ref 3.7–5.3)
SODIUM: 142 meq/L (ref 137–147)
TOTAL PROTEIN: 6.2 g/dL (ref 6.0–8.3)
Total Bilirubin: 0.6 mg/dL (ref 0.3–1.2)

## 2013-05-04 LAB — URINALYSIS, ROUTINE W REFLEX MICROSCOPIC
Bilirubin Urine: NEGATIVE
GLUCOSE, UA: NEGATIVE mg/dL
KETONES UR: NEGATIVE mg/dL
LEUKOCYTES UA: NEGATIVE
Nitrite: NEGATIVE
PH: 5.5 (ref 5.0–8.0)
Protein, ur: NEGATIVE mg/dL
Specific Gravity, Urine: 1.015 (ref 1.005–1.030)
Urobilinogen, UA: 0.2 mg/dL (ref 0.0–1.0)

## 2013-05-04 LAB — STREP PNEUMONIAE URINARY ANTIGEN: Strep Pneumo Urinary Antigen: NEGATIVE

## 2013-05-04 LAB — CBC WITH DIFFERENTIAL/PLATELET
BASOS ABS: 0 10*3/uL (ref 0.0–0.1)
BASOS PCT: 0 % (ref 0–1)
EOS ABS: 0 10*3/uL (ref 0.0–0.7)
Eosinophils Relative: 0 % (ref 0–5)
HCT: 36.5 % — ABNORMAL LOW (ref 39.0–52.0)
Hemoglobin: 12.1 g/dL — ABNORMAL LOW (ref 13.0–17.0)
Lymphocytes Relative: 15 % (ref 12–46)
Lymphs Abs: 1.5 10*3/uL (ref 0.7–4.0)
MCH: 29 pg (ref 26.0–34.0)
MCHC: 33.2 g/dL (ref 30.0–36.0)
MCV: 87.5 fL (ref 78.0–100.0)
Monocytes Absolute: 0.8 10*3/uL (ref 0.1–1.0)
Monocytes Relative: 7 % (ref 3–12)
NEUTROS ABS: 8 10*3/uL — AB (ref 1.7–7.7)
NEUTROS PCT: 78 % — AB (ref 43–77)
PLATELETS: 172 10*3/uL (ref 150–400)
RBC: 4.17 MIL/uL — ABNORMAL LOW (ref 4.22–5.81)
RDW: 15.9 % — AB (ref 11.5–15.5)
WBC: 10.3 10*3/uL (ref 4.0–10.5)

## 2013-05-04 LAB — POCT I-STAT TROPONIN I: TROPONIN I, POC: 0 ng/mL (ref 0.00–0.08)

## 2013-05-04 LAB — PROTIME-INR
INR: 1.19 (ref 0.00–1.49)
Prothrombin Time: 14.8 seconds (ref 11.6–15.2)

## 2013-05-04 LAB — INFLUENZA PANEL BY PCR (TYPE A & B)
H1N1 flu by pcr: NOT DETECTED
INFLAPCR: NEGATIVE
INFLBPCR: NEGATIVE

## 2013-05-04 LAB — CG4 I-STAT (LACTIC ACID): Lactic Acid, Venous: 1.61 mmol/L (ref 0.5–2.2)

## 2013-05-04 LAB — PRO B NATRIURETIC PEPTIDE: Pro B Natriuretic peptide (BNP): 1749 pg/mL — ABNORMAL HIGH (ref 0–450)

## 2013-05-04 LAB — URINE MICROSCOPIC-ADD ON

## 2013-05-04 MED ORDER — SODIUM CHLORIDE 0.9 % IV SOLN: INTRAVENOUS | Status: DC

## 2013-05-04 MED ORDER — LEVALBUTEROL HCL 0.63 MG/3ML IN NEBU
0.6300 mg | INHALATION_SOLUTION | Freq: Four times a day (QID) | RESPIRATORY_TRACT | Status: DC
  Administered 2013-05-04 – 2013-05-07 (×11): 0.63 mg via RESPIRATORY_TRACT
  Filled 2013-05-04 (×22): qty 3

## 2013-05-04 MED ORDER — DEXTROSE 5 % IV SOLN
500.0000 mg | INTRAVENOUS | Status: DC
  Administered 2013-05-05: 500 mg via INTRAVENOUS
  Filled 2013-05-04 (×4): qty 500

## 2013-05-04 MED ORDER — MOMETASONE FURO-FORMOTEROL FUM 100-5 MCG/ACT IN AERO
2.0000 | INHALATION_SPRAY | Freq: Two times a day (BID) | RESPIRATORY_TRACT | Status: DC
  Administered 2013-05-04 – 2013-05-07 (×6): 2 via RESPIRATORY_TRACT
  Filled 2013-05-04 (×2): qty 8.8

## 2013-05-04 MED ORDER — ACETAMINOPHEN 325 MG PO TABS
650.0000 mg | ORAL_TABLET | Freq: Once | ORAL | Status: AC
  Administered 2013-05-04: 650 mg via ORAL
  Filled 2013-05-04: qty 2

## 2013-05-04 MED ORDER — APIXABAN 5 MG PO TABS
5.0000 mg | ORAL_TABLET | Freq: Two times a day (BID) | ORAL | Status: DC
Start: 2013-05-04 — End: 2013-05-07
  Administered 2013-05-04 – 2013-05-07 (×6): 5 mg via ORAL
  Filled 2013-05-04 (×7): qty 1

## 2013-05-04 MED ORDER — ALBUTEROL SULFATE (2.5 MG/3ML) 0.083% IN NEBU
2.5000 mg | INHALATION_SOLUTION | RESPIRATORY_TRACT | Status: DC | PRN
  Administered 2013-05-04: 2.5 mg via RESPIRATORY_TRACT
  Filled 2013-05-04: qty 3

## 2013-05-04 MED ORDER — SODIUM CHLORIDE 0.9 % IV BOLUS (SEPSIS)
500.0000 mL | Freq: Once | INTRAVENOUS | Status: AC
  Administered 2013-05-04: 500 mL via INTRAVENOUS

## 2013-05-04 MED ORDER — FAMOTIDINE 20 MG PO TABS
20.0000 mg | ORAL_TABLET | Freq: Every day | ORAL | Status: DC
  Administered 2013-05-04 – 2013-05-06 (×3): 20 mg via ORAL
  Filled 2013-05-04 (×4): qty 1

## 2013-05-04 MED ORDER — SODIUM CHLORIDE 0.9 % IV SOLN
INTRAVENOUS | Status: DC
  Administered 2013-05-04 – 2013-05-05 (×2): via INTRAVENOUS

## 2013-05-04 MED ORDER — LATANOPROST 0.005 % OP SOLN
1.0000 [drp] | Freq: Every day | OPHTHALMIC | Status: DC
  Administered 2013-05-04 – 2013-05-06 (×3): 1 [drp] via OPHTHALMIC
  Filled 2013-05-04: qty 2.5

## 2013-05-04 MED ORDER — KETOCONAZOLE 2 % EX CREA
TOPICAL_CREAM | Freq: Every day | CUTANEOUS | Status: DC
  Administered 2013-05-04 – 2013-05-06 (×3): via TOPICAL
  Filled 2013-05-04 (×2): qty 15

## 2013-05-04 MED ORDER — VITAMIN B-12 1000 MCG PO TABS
1000.0000 ug | ORAL_TABLET | Freq: Every day | ORAL | Status: DC
  Administered 2013-05-04 – 2013-05-07 (×4): 1000 ug via ORAL
  Filled 2013-05-04 (×4): qty 1

## 2013-05-04 MED ORDER — MONTELUKAST SODIUM 10 MG PO TABS
10.0000 mg | ORAL_TABLET | Freq: Every day | ORAL | Status: DC
  Administered 2013-05-04 – 2013-05-06 (×3): 10 mg via ORAL
  Filled 2013-05-04 (×4): qty 1

## 2013-05-04 MED ORDER — DEXTROSE 5 % IV SOLN
500.0000 mg | Freq: Once | INTRAVENOUS | Status: AC
  Administered 2013-05-04: 500 mg via INTRAVENOUS

## 2013-05-04 MED ORDER — RISAQUAD PO CAPS
1.0000 | ORAL_CAPSULE | Freq: Every day | ORAL | Status: DC
  Administered 2013-05-05 – 2013-05-07 (×3): 1 via ORAL
  Filled 2013-05-04 (×3): qty 1

## 2013-05-04 MED ORDER — SODIUM CHLORIDE 0.9 % IV SOLN
1000.0000 mL | Freq: Once | INTRAVENOUS | Status: AC
  Administered 2013-05-04: 1000 mL via INTRAVENOUS

## 2013-05-04 MED ORDER — CEFTRIAXONE SODIUM 1 G IJ SOLR
1.0000 g | Freq: Once | INTRAMUSCULAR | Status: AC
  Administered 2013-05-04: 1 g via INTRAVENOUS
  Filled 2013-05-04: qty 10

## 2013-05-04 MED ORDER — BUDESONIDE 0.25 MG/2ML IN SUSP
0.2500 mg | Freq: Two times a day (BID) | RESPIRATORY_TRACT | Status: DC
  Administered 2013-05-04 – 2013-05-07 (×5): 0.25 mg via RESPIRATORY_TRACT
  Filled 2013-05-04 (×9): qty 2

## 2013-05-04 MED ORDER — HYDRALAZINE HCL 20 MG/ML IJ SOLN
10.0000 mg | Freq: Once | INTRAMUSCULAR | Status: AC
  Administered 2013-05-04: 10 mg via INTRAVENOUS
  Filled 2013-05-04: qty 1

## 2013-05-04 MED ORDER — IBUPROFEN 200 MG PO TABS
600.0000 mg | ORAL_TABLET | Freq: Once | ORAL | Status: AC
  Administered 2013-05-04: 600 mg via ORAL
  Filled 2013-05-04: qty 3

## 2013-05-04 MED ORDER — PROMETHAZINE HCL 25 MG PO TABS: 12.5000 mg | ORAL_TABLET | Freq: Four times a day (QID) | ORAL | Status: DC | PRN

## 2013-05-04 MED ORDER — DM-GUAIFENESIN ER 30-600 MG PO TB12
1.0000 | ORAL_TABLET | Freq: Two times a day (BID) | ORAL | Status: DC
  Administered 2013-05-04 – 2013-05-07 (×6): 1 via ORAL
  Filled 2013-05-04 (×7): qty 1

## 2013-05-04 MED ORDER — VANCOMYCIN HCL IN DEXTROSE 750-5 MG/150ML-% IV SOLN
750.0000 mg | Freq: Two times a day (BID) | INTRAVENOUS | Status: DC
Start: 2013-05-04 — End: 2013-05-07
  Administered 2013-05-04 – 2013-05-07 (×7): 750 mg via INTRAVENOUS
  Filled 2013-05-04 (×8): qty 150

## 2013-05-04 MED ORDER — DEXTROSE 5 % IV SOLN
1.0000 g | Freq: Three times a day (TID) | INTRAVENOUS | Status: DC
  Administered 2013-05-04 – 2013-05-07 (×10): 1 g via INTRAVENOUS
  Filled 2013-05-04 (×12): qty 1

## 2013-05-04 MED ORDER — LOPERAMIDE HCL 2 MG PO TABS: 2.0000 mg | ORAL_TABLET | ORAL | Status: DC | PRN

## 2013-05-04 MED ORDER — CYANOCOBALAMIN 1000 MCG/ML IJ SOLN
1000.0000 ug | Freq: Every day | INTRAMUSCULAR | Status: DC
  Filled 2013-05-04: qty 1

## 2013-05-04 MED ORDER — LOPERAMIDE HCL 2 MG PO CAPS: 2.0000 mg | ORAL_CAPSULE | ORAL | Status: DC | PRN

## 2013-05-04 MED ORDER — SODIUM CHLORIDE 0.9 % IV SOLN
1000.0000 mL | INTRAVENOUS | Status: DC
  Administered 2013-05-04: 1000 mL via INTRAVENOUS

## 2013-05-04 MED ORDER — NITROGLYCERIN 0.4 MG SL SUBL
0.4000 mg | SUBLINGUAL_TABLET | SUBLINGUAL | Status: DC | PRN
  Filled 2013-05-04: qty 25

## 2013-05-04 MED ORDER — DEXTROSE 5 % IV SOLN: 1.0000 g | INTRAVENOUS | Status: DC

## 2013-05-04 MED ORDER — LORAZEPAM 0.5 MG PO TABS
0.5000 mg | ORAL_TABLET | Freq: Two times a day (BID) | ORAL | Status: DC
  Administered 2013-05-04 – 2013-05-07 (×6): 0.5 mg via ORAL
  Filled 2013-05-04 (×6): qty 1

## 2013-05-04 MED ORDER — ONDANSETRON HCL 4 MG/2ML IJ SOLN
4.0000 mg | Freq: Once | INTRAMUSCULAR | Status: AC
  Administered 2013-05-04: 4 mg via INTRAVENOUS
  Filled 2013-05-04: qty 2

## 2013-05-04 MED ORDER — METHYLPREDNISOLONE SODIUM SUCC 125 MG IJ SOLR
125.0000 mg | Freq: Once | INTRAMUSCULAR | Status: AC
  Administered 2013-05-04: 125 mg via INTRAVENOUS
  Filled 2013-05-04: qty 2

## 2013-05-04 NOTE — H&P (Signed)
[facility] History and Physical  [name]. ENI:[id number] DOB: 04-28-1930 DOA: 05/04/2013  Referring physician: EDP PCP: Gwendolyn Grant, MD   Chief Complaint: cough and shortness of breath  HPI: [name]. is a 80 y.o. male  Presents with sudden onset cough, dyspnea, wheeze, chills. Woke up with symptoms last night. Nausea. Diarrhea. No sick contacts. Given NRB, albuterol en route. On arrival in ED, HR 140, temp 104, BP 103/48. CXR shows LLL consolidation, hazy right basilar infiltrate. WBC normal. Flu PCR negative. After 2.5 L saline, HR down to 100.  BP now 108/50. Feels better. Now on nasal cannula.  Known to me from previous hospitalization 1.5 months ago.   Review of Systems:  Systems reviewed. C/o foot pain and swelling for several months. Otherwise negative or per HPI.   Past Medical History  Diagnosis Date  . CORONARY HEART DISEASE     a. s/p CABG;  b.  cath 06/27/10: S-Dx occluded, S-PDA 80-90% (tx with PCI); S-OM ok, L-LAD ok;  EF 65-70%  c.  s/p Promus DES to S-PDA 06/2010;   d. Myoview 8/12: low risk  . FIBRILLATION, ATRIAL     post op; ?documented during hosp. 06/2010  . SLEEP APNEA 09/2001    NPSG AHI 22/HR  . ALLERGIC RHINITIS   . ASTHMA   . Depression   . GERD     with HH, hx esophageal stricture  . DYSLIPIDEMIA   . HYPERTENSION     Echo 3/12: EF 55-60%; mod LVH; mild AS/AI; LAE; PASP 38; mild pulmo HTN  . Personal history of alcoholism   . Diverticulosis   . Benign liver cyst   . Skin cancer     L forearm  . Cataract     surgery to both eyes  . Esophageal stricture   . Hiatal hernia    Past Surgical History  Procedure Laterality Date  . Coronary artery bypass graft  02/2007  . Hernia repair  12/03/07  . Tonsillectomy    . Coronary angioplasty with stent placement  07/2010  . Cataract extraction, bilateral  2007  . Hernia repair  unsure ?60's  . Cholecystectomy  02/21/2011    Procedure: LAPAROSCOPIC CHOLECYSTECTOMY WITH  INTRAOPERATIVE CHOLANGIOGRAM;  Surgeon: Pedro Earls, MD;  Location: [facility];  Service: General;  Laterality: N/A;   Social History:  reports that he quit smoking about 34 years ago. His smoking use included Cigarettes. He has a 120 pack-year smoking history. He has never used smokeless tobacco. He reports that he does not drink alcohol or use illicit drugs.  Allergies  Allergen Reactions  . Codeine Nausea And Vomiting  . Lasix [Furosemide] Itching    Family History  Problem Relation Age of Onset  . COPD Sister   . Asthma Sister   . Emphysema Sister   . Hypertension Sister   . Hypertension Mother   . Colon cancer Neg Hx   . Heart disease Brother   . Heart disease Mother   . Heart disease Father      Prior to Admission medications   Medication Sig Start Date End Date Taking? Authorizing Provider  acidophilus (RISAQUAD) CAPS Take 1 capsule by mouth daily. [facility] probiotics   Yes Historical Provider, MD  albuterol (PROVENTIL HFA;VENTOLIN HFA) 108 (90 BASE) MCG/ACT inhaler Inhale into the lungs every 6 (six) hours as needed for wheezing or shortness of breath.   Yes Historical Provider, MD  apixaban (ELIQUIS) 5 MG TABS tablet Take 5 mg  by mouth 2 (two) times daily.   Yes Historical Provider, MD  bisoprolol (ZEBETA) 10 MG tablet Take 1 tablet (10 mg total) by mouth daily. 03/19/13  Yes Rowe Clack, MD  budesonide (PULMICORT) 0.25 MG/2ML nebulizer solution Take 0.25 mg by nebulization 2 (two) times daily as needed. For asthma   Yes Historical Provider, MD  calcium-vitamin D (OSCAL WITH D) 500-200 MG-UNIT per tablet Take 1 tablet by mouth 2 (two) times daily.   Yes Historical Provider, MD  cloNIDine (CATAPRES) 0.3 MG tablet Take 1 tablet (0.3 mg total) by mouth 2 (two) times daily. 04/17/13  Yes Minus Breeding, MD  diltiazem (CARDIZEM CD) 180 MG 24 hr capsule Take 180 mg by mouth 2 (two) times daily.   Yes Historical Provider, MD  famotidine (PEPCID) 20 MG tablet Take 20 mg by  mouth at bedtime.   Yes Historical Provider, MD  ferrous sulfate 325 (65 FE) MG tablet Take 1 tablet (325 mg total) by mouth 3 (three) times daily with meals. 04/24/13  Yes Minus Breeding, MD  hydrochlorothiazide (HYDRODIURIL) 25 MG tablet Take 1 tablet (25 mg total) by mouth daily. 03/28/13  Yes Rowe Clack, MD  ipratropium-albuterol (DUONEB) 0.5-2.5 (3) MG/3ML SOLN Take 3 mLs by nebulization every 6 (six) hours as needed (for shortness of breath).   Yes Historical Provider, MD  latanoprost (XALATAN) 0.005 % ophthalmic solution Place 1 drop into both eyes at bedtime.    Yes Historical Provider, MD  loperamide (IMODIUM A-D) 2 MG tablet Take 2 mg by mouth as needed. For loose stool 08/30/11  Yes Irene Shipper, MD  LORazepam (ATIVAN) 0.5 MG tablet Take 0.5 mg by mouth 2 (two) times daily.   Yes Historical Provider, MD  losartan (COZAAR) 100 MG tablet Take 100 mg by mouth daily.   Yes Historical Provider, MD  mometasone-formoterol (DULERA) 100-5 MCG/ACT AERO Inhale 2 puffs into the lungs 2 (two) times daily.   Yes Historical Provider, MD  montelukast (SINGULAIR) 10 MG tablet Take 10 mg by mouth at bedtime.   Yes Historical Provider, MD  omeprazole (PRILOSEC) 20 MG capsule Take 1 capsule (20 mg total) by mouth daily. 04/29/13  Yes Irene Shipper, MD  potassium chloride SA (K-DUR,KLOR-CON) 20 MEQ tablet Take 20 mEq by mouth 2 (two) times daily.   Yes Historical Provider, MD  pravastatin (PRAVACHOL) 80 MG tablet Take 80 mg by mouth daily.   Yes Historical Provider, MD  promethazine (PHENERGAN) 25 MG tablet Take 1 tablet (25 mg total) by mouth every 6 (six) hours as needed for nausea or vomiting. 03/12/13  Yes Rowe Clack, MD  denosumab (PROLIA) 60 MG/ML SOLN injection Inject 60 mg into the skin every 6 (six) months. Administer in upper arm, thigh, or abdomen 06/12/12   Rowe Clack, MD  nitroGLYCERIN (NITROSTAT) 0.4 MG SL tablet Place 0.4 mg under the tongue every 5 (five) minutes as needed for  chest pain.    Historical Provider, MD   Physical Exam: Filed Vitals:   05/04/13 1200  BP: 108/51  Pulse: 105  Temp:   Resp: 21    BP 108/51  Pulse 105  Temp(Src) 101.5 F (38.6 C) (Rectal)  Resp 21  Ht 5\' 2"  (1.575 m)  Wt 72.576 kg (160 lb)  BMI 29.26 kg/m2  SpO2 96%  BP 139/61  Pulse 103  Temp(Src) 98 F (36.7 C) (Oral)  Resp 20  Ht 5\' 2"  (1.575 m)  Wt 75.1 kg (165 lb 9.1 oz)  BMI 30.27 kg/m2  SpO2 97%  Tele: sinus tachycardia  General Appearance:    Alert, cooperative, oriented. Flushed.  Hot to touch  Head:    Normocephalic, without obvious abnormality, atraumatic  Eyes:    PERRL, conjunctiva/corneas clear, EOM's intact, fundi    benign, both eyes          Nose:   Nares normal, septum midline, mucosa normal, no drainage   or sinus tenderness  Throat:   Lips, mucosa, and tongue normal; teeth and gums normal  Neck:   Supple, symmetrical, trachea midline, no adenopathy;       thyroid:  No enlargement/tenderness/nodules; no carotid   bruit or JVD  Back:     Symmetric, no curvature, ROM normal, no CVA tenderness  Lungs:     Clear to auscultation bilaterally, respirations unlabored  Chest wall:    No tenderness or deformity  Heart:    Regular rate and rhythm, S1 and S2 normal, no murmur, rub   or gallop  Abdomen:     Soft, non-tender, bowel sounds active all four quadrants,    no masses, no organomegaly  Genitalia:   deferred  Rectal:   deferred  Extremities:   Extremities normal, atraumatic, no cyanosis bilateral pedal edema  Pulses:   2+ and symmetric all extremities  Skin:   Skin color, texture, turgor normal, no rashes or lesions  Lymph nodes:   Cervical, supraclavicular, and axillary nodes normal  Neurologic:   CNII-XII intact. Normal strength, sensation and reflexes      throughout             Psych: normal affect  Labs on Admission:  Basic Metabolic Panel:  Recent Labs Lab 05/04/13 1008  NA 142  K 4.1  CL 104  CO2 24  GLUCOSE 104*  BUN  14  CREATININE 0.90  CALCIUM 8.7   Liver Function Tests:  Recent Labs Lab 05/04/13 1008  AST 25  ALT 31  ALKPHOS 91  BILITOT 0.6  PROT 6.2  ALBUMIN 3.7   No results found for this basename: LIPASE, AMYLASE,  in the last 168 hours No results found for this basename: AMMONIA,  in the last 168 hours CBC:  Recent Labs Lab 05/04/13 1008  WBC 10.3  NEUTROABS 8.0*  HGB 12.1*  HCT 36.5*  MCV 87.5  PLT 172   Cardiac Enzymes: No results found for this basename: CKTOTAL, CKMB, CKMBINDEX, TROPONINI,  in the last 168 hours  BNP (last 3 results)  Recent Labs  03/08/13 2023 03/14/13 1047 05/04/13 1008  PROBNP 236.4 755.0* 1749.0*   CBG: No results found for this basename: GLUCAP,  in the last 168 hours  Radiological Exams on Admission: Dg Chest Port 1 View  05/04/2013   CLINICAL DATA:  Short of breath  EXAM: PORTABLE CHEST - 1 VIEW  COMPARISON:  03/14/2013  FINDINGS: Left mid and lower lung zone consolidation. Hazy airspace disease at the lateral right base. No pneumothorax. Mild cardiomegaly.  IMPRESSION: Left lower lobe consolidation. Hazy airspace disease at the right base.   Electronically Signed   By: Maryclare Bean M.D.   On: 05/04/2013 10:39    EKG: Sinus tachycardia RSR' in V1 or V2, right VCD or RVH Probable anteroseptal infarct, old  Assessment/Plan Principal Problem:   HCAP (healthcare-associated pneumonia): vanc, cefepime. Urine legionella and strep pneumo antigen. Blood cultures drawn Active Problems: Early Sepsis:  HR, BP improved after 2.5 liters NS. Admit to tele   DYSLIPIDEMIA   CORONARY HEART  DISEASE   Asthma with COPD: xopenex, oxygen.   GERD   Obstructive sleep apnea   Bilateral leg edema: very troublesome to patient. Had been on torsemide in the past. EF, TSH, albumin ok. Consider diuretics as outpatient   PAF (paroxysmal atrial fibrillation) on elequis   Foot pain, bilateral: B12 level previously borderline low. Will give B12 and recheck   Benign  hypertension: hold antihypertensives for now  Code Status: full Family Communication: multiple at bedside Disposition Plan: home  Time spent: 53 min  [facility] Pager [phone]

## 2013-05-04 NOTE — ED Notes (Signed)
Influenza panel negative, droplet precautions discontinued

## 2013-05-04 NOTE — Progress Notes (Signed)
Notified hospitalist of elevated blood pressure of 182/70 and patient stated he had a headache but has gone away. Currently patient states he feels fine and when asked does not know if he takes blood pressure medications or not. Orders given to administer hydralazine 10mg  and nurse has given per one time order. Will continue to monitor and check blood within an hour after administration and continue to monitor patient to end of shift.

## 2013-05-04 NOTE — Progress Notes (Signed)
ANTIBIOTIC CONSULT NOTE - INITIAL  Pharmacy Consult for azithromycin + ceftriaxone Indication: rule out sepsis  Allergies  Allergen Reactions  . Codeine Nausea And Vomiting  . Lasix [Furosemide] Itching    Patient Measurements: Height: 5\' 2"  (157.5 cm) Weight: 160 lb (72.576 kg) IBW/kg (Calculated) : 54.6 Adjusted Body Weight:   Vital Signs: Temp: 104.1 F (40.1 C) (01/25 0942) Temp src: Oral (01/25 0942) BP: 125/60 mmHg (01/25 0942) Pulse Rate: 127 (01/25 0942) Intake/Output from previous day:   Intake/Output from this shift:    Labs: No results found for this basename: WBC, HGB, PLT, LABCREA, CREATININE,  in the last 72 hours Estimated Creatinine Clearance: 42.9 ml/min (by C-G formula based on Cr of 1.2). No results found for this basename: VANCOTROUGH, VANCOPEAK, VANCORANDOM, GENTTROUGH, GENTPEAK, GENTRANDOM, TOBRATROUGH, TOBRAPEAK, TOBRARND, AMIKACINPEAK, AMIKACINTROU, AMIKACIN,  in the last 72 hours   Microbiology: No results found for this or any previous visit (from the past 720 hour(s)).  Medical History: Past Medical History  Diagnosis Date  . CORONARY HEART DISEASE     a. s/p CABG;  b.  cath 06/27/10: S-Dx occluded, S-PDA 80-90% (tx with PCI); S-OM ok, L-LAD ok;  EF 65-70%  c.  s/p Promus DES to S-PDA 06/2010;   d. Myoview 8/12: low risk  . FIBRILLATION, ATRIAL     post op; ?documented during hosp. 06/2010  . SLEEP APNEA 09/2001    NPSG AHI 22/HR  . ALLERGIC RHINITIS   . ASTHMA   . Depression   . GERD     with HH, hx esophageal stricture  . DYSLIPIDEMIA   . HYPERTENSION     Echo 3/12: EF 55-60%; mod LVH; mild AS/AI; LAE; PASP 38; mild pulmo HTN  . Personal history of alcoholism   . Diverticulosis   . Benign liver cyst   . Skin cancer     L forearm  . Cataract     surgery to both eyes  . Esophageal stricture   . Hiatal hernia     Medications:  Anti-infectives   Start     Dose/Rate Route Frequency Ordered Stop   05/05/13 1200  cefTRIAXone  (ROCEPHIN) 1 g in dextrose 5 % 50 mL IVPB     1 g 100 mL/hr over 30 Minutes Intravenous Every 24 hours 05/04/13 1022     05/05/13 1200  azithromycin (ZITHROMAX) 500 mg in dextrose 5 % 250 mL IVPB     500 mg 250 mL/hr over 60 Minutes Intravenous Every 24 hours 05/04/13 1022     05/04/13 1015  cefTRIAXone (ROCEPHIN) 1 g in dextrose 5 % 50 mL IVPB     1 g 100 mL/hr over 30 Minutes Intravenous  Once 05/04/13 1004     05/04/13 1015  azithromycin (ZITHROMAX) 500 mg in dextrose 5 % 250 mL IVPB     500 mg 250 mL/hr over 60 Minutes Intravenous  Once 05/04/13 1004       Assessment: 73 yom presented to the ED with cough and SOB. To start empiric ceftriaxone + azithromycin for sepsis. Tmax is 104.1, labs are pending.   Azithro 1/25>> CTX 1/25>>  Goal of Therapy:  Eradication of infection  Plan:  1. Azithromycin 500mg  IV Q24H 2. Ceftriaxone 1gm IV Q24H  Pharmacy will sign-off since there are not dose adjustments anticipated. Please re-consult [place] if necessary. Thank you for the consult!  Ryley Teater, Rande Lawman 05/04/2013,10:22 AM

## 2013-05-04 NOTE — ED Notes (Addendum)
Dr. Stevie Kern at bedside, patient will not be a Level I sepsis at this time

## 2013-05-04 NOTE — ED Provider Notes (Signed)
CSN: [id number]     Arrival date & time 05/04/13  0930 History   First MD Initiated Contact with Patient 05/04/13 [phone]     No chief complaint on file. fever (Consider location/radiation/quality/duration/timing/severity/associated sxs/prior Treatment) HPI 80 year old male with history of COPD, atrial fibrillation, heart failure, presents with about 12 hours of fever cough shortness of breath with no confusion no vomiting no significant diarrhea no abdominal pain no chest pain no rash, his inhaler at home helps only slightly, he feels much better with oxygen from EMS as well as albuterol Atrovent nebulizer from EMS prior to arrival, it is unknown what his pulse oximetry was in room air prior to arrival and he is not on home oxygen but has mild hypoxia upon arrival with pulse oximetry 92% on 3 L nasal cannula oxygen. His shortness of breath was moderately severe prior to arrival and is now mild to moderate upon arrival at rest. He is able to speak short sentences upon arrival. Past Medical History  Diagnosis Date  . CORONARY HEART DISEASE     a. s/p CABG;  b.  cath 06/27/10: S-Dx occluded, S-PDA 80-90% (tx with PCI); S-OM ok, L-LAD ok;  EF 65-70%  c.  s/p Promus DES to S-PDA 06/2010;   d. Myoview 8/12: low risk  . FIBRILLATION, ATRIAL     post op; ?documented during hosp. 06/2010  . SLEEP APNEA 09/2001    NPSG AHI 22/HR  . ALLERGIC RHINITIS   . ASTHMA   . Depression   . GERD     with HH, hx esophageal stricture  . DYSLIPIDEMIA   . HYPERTENSION     Echo 3/12: EF 55-60%; mod LVH; mild AS/AI; LAE; PASP 38; mild pulmo HTN  . Personal history of alcoholism   . Diverticulosis   . Benign liver cyst   . Skin cancer     L forearm  . Cataract     surgery to both eyes  . Esophageal stricture   . Hiatal hernia   . Pneumonia    Past Surgical History  Procedure Laterality Date  . Coronary artery bypass graft  02/2007  . Hernia repair  12/03/07  . Tonsillectomy    . Coronary angioplasty with stent  placement  07/2010  . Cataract extraction, bilateral  2007  . Hernia repair  unsure ?60's  . Cholecystectomy  02/21/2011    Procedure: LAPAROSCOPIC CHOLECYSTECTOMY WITH INTRAOPERATIVE CHOLANGIOGRAM;  Surgeon: Pedro Earls, MD;  Location: [facility];  Service: General;  Laterality: N/A;   Family History  Problem Relation Age of Onset  . COPD Sister   . Asthma Sister   . Emphysema Sister   . Hypertension Sister   . Hypertension Mother   . Colon cancer Neg Hx   . Heart disease Brother   . Heart disease Mother   . Heart disease Father    History  Substance Use Topics  . Smoking status: Former Smoker -- 3.00 packs/day for 40 years    Types: Cigarettes    Quit date: 04/11/1979  . Smokeless tobacco: Never Used  . Alcohol Use: No    Review of Systems 10 Systems reviewed and are negative for acute change except as noted in the HPI. Allergies  Codeine and Lasix  Home Medications   No current outpatient prescriptions on file. BP 139/61  Pulse 98  Temp(Src) 98 F (36.7 C) (Oral)  Resp 20  Ht 5\' 2"  (1.575 m)  Wt 165 lb 9.1 oz (75.1 kg)  BMI 30.27  kg/m2  SpO2 98% Physical Exam  Nursing note and vitals reviewed. Constitutional:  Awake, alert, nontoxic appearance.  HENT:  Head: Atraumatic.  Eyes: Right eye exhibits no discharge. Left eye exhibits no discharge.  Neck: Neck supple.  Cardiovascular: Regular rhythm.   No murmur heard. Tachycardic  Pulmonary/Chest: He is in respiratory distress. He has no wheezes. He has rales. He exhibits no tenderness.  Mild to moderate respiratory distress at rest with mild hypoxia with pulse oximetry 92% on 3 L nasal cannula oxygen he is able to speak short is with no retractions no accessory muscle usage but does have tachypnea with crackles at the right base with wheezing transiently resolved after receiving albuterol Atrovent nebulizer prior to arrival with no rhonchi or wheezing upon arrival  Abdominal: Soft. There is no tenderness. There  is no rebound.  Musculoskeletal: He exhibits edema. He exhibits no tenderness.  Baseline ROM, no obvious new focal weakness. Trace edema which is baseline for the patient.  Neurological: He is alert.  Mental status and motor strength appears baseline for patient and situation.  Skin: No rash noted.  Psychiatric: He has a normal mood and affect.    ED Course  Procedures (including critical care time) Pt feels improved after observation and/or treatment in ED. Pulse improved from 120's to 105, SBP~100 with borderline MAP64, sat 96% speaking sentences nasal cannula, d/w [facility] for admit.  Pt feels improved after observation and/or treatment in ED.Patient informed of clinical course, understand medical decision-making process, and agree with plan.  CRITICAL CARE Performed by: Babette Relic Total critical care time: 52min including IVF bolus and antibiotics for CAP with sepsis. Critical care time was exclusive of separately billable procedures and treating other patients. Critical care was necessary to treat or prevent imminent or life-threatening deterioration. Critical care was time spent personally by me on the following activities: development of treatment plan with patient and/or surrogate as well as nursing, discussions with consultants, evaluation of patient's response to treatment, examination of patient, obtaining history from patient or surrogate, ordering and performing treatments and interventions, ordering and review of laboratory studies, ordering and review of radiographic studies, pulse oximetry and re-evaluation of patient's condition. Labs Review Labs Reviewed  CBC WITH DIFFERENTIAL - Abnormal; Notable for the following:    RBC 4.17 (*)    Hemoglobin 12.1 (*)    HCT 36.5 (*)    RDW 15.9 (*)    Neutrophils Relative % 78 (*)    Neutro Abs 8.0 (*)    All other components within normal limits  COMPREHENSIVE METABOLIC PANEL - Abnormal; Notable for the following:    Glucose, Bld  104 (*)    GFR calc non Af Amer 78 (*)    All other components within normal limits  URINALYSIS, ROUTINE W REFLEX MICROSCOPIC - Abnormal; Notable for the following:    Hgb urine dipstick TRACE (*)    All other components within normal limits  PRO B NATRIURETIC PEPTIDE - Abnormal; Notable for the following:    Pro B Natriuretic peptide (BNP) 1749.0 (*)    All other components within normal limits  CULTURE, BLOOD (ROUTINE X 2)  CULTURE, BLOOD (ROUTINE X 2)  URINE CULTURE  PROTIME-INR  URINE MICROSCOPIC-ADD ON  INFLUENZA PANEL BY PCR (TYPE A & B, H1N1)  VITAMIN B12  LEGIONELLA ANTIGEN, URINE  STREP PNEUMONIAE URINARY ANTIGEN  BASIC METABOLIC PANEL  CBC WITH DIFFERENTIAL  CG4 I-STAT (LACTIC ACID)  POCT I-STAT TROPONIN I   Imaging Review Dg Chest [facility]  1 View  05/04/2013   CLINICAL DATA:  Short of breath  EXAM: PORTABLE CHEST - 1 VIEW  COMPARISON:  03/14/2013  FINDINGS: Left mid and lower lung zone consolidation. Hazy airspace disease at the lateral right base. No pneumothorax. Mild cardiomegaly.  IMPRESSION: Left lower lobe consolidation. Hazy airspace disease at the right base.   Electronically Signed   By: Maryclare Bean M.D.   On: 05/04/2013 10:39    EKG Interpretation    Date/Time:  Sunday May 04 2013 09:38:18 EST Ventricular Rate:  124 PR Interval:  129 QRS Duration: 96 QT Interval:  294 QTC Calculation: 422 R Axis:   74 Text Interpretation:  Sinus tachycardia RSR' in V1 or V2, right VCD or RVH Probable anteroseptal infarct, old No significant change since last tracing Confirmed by [facility]  MD, Sandra Brents [phone]) on 05/04/2013 10:10:07 AM            MDM   1. CAP (community acquired pneumonia)   2. Sepsis   3. Bilateral leg edema   4. Coronary atherosclerosis of unspecified type of vessel, native or graft   5. Foot pain, bilateral   6. HCAP (healthcare-associated pneumonia)   7. PAF (paroxysmal atrial fibrillation)    The patient appears reasonably stabilized for  admission considering the current resources, flow, and capabilities available in the ED at this time, and I doubt any other [facility] requiring further screening and/or treatment in the ED prior to admission.    Babette Relic, MD 05/04/13 [phone]

## 2013-05-04 NOTE — ED Notes (Signed)
Patient states he feels sob, rr: 24, spO2: 97% on 2L.  Breathing tx given per

## 2013-05-04 NOTE — ED Notes (Signed)
Pt coming from home, hx asthma, woke up middle night dyspnic and coughing.  Wheezing in all fields, patient initially put on NRB by fire depart, given 5 albuteral .5 atrovent.  Warm to touch, cough but has not been productive. Hx afib but currently sinus tach

## 2013-05-04 NOTE — Progress Notes (Signed)
ANTIBIOTIC CONSULT NOTE - FOLLOW UP  Pharmacy Consult for Vancomycin Indication: rule out sepsis  Allergies  Allergen Reactions  . Codeine Nausea And Vomiting  . Lasix [Furosemide] Itching    Patient Measurements: Height: 5\' 2"  (157.5 cm) Weight: 165 lb 9.1 oz (75.1 kg) (scale B) IBW/kg (Calculated) : 54.6 Adjusted Body Weight:   Vital Signs: Temp: 98 F (36.7 C) (01/25 1420) Temp src: Oral (01/25 1420) BP: 139/61 mmHg (01/25 1420) Pulse Rate: 103 (01/25 1420) Intake/Output from previous day:   Intake/Output from this shift:    Labs:  Recent Labs  05/04/13 1008  WBC 10.3  HGB 12.1*  PLT 172  CREATININE 0.90   Estimated Creatinine Clearance: 58.1 ml/min (by C-G formula based on Cr of 0.9). No results found for this basename: VANCOTROUGH, VANCOPEAK, VANCORANDOM, GENTTROUGH, GENTPEAK, GENTRANDOM, TOBRATROUGH, TOBRAPEAK, TOBRARND, AMIKACINPEAK, AMIKACINTROU, AMIKACIN,  in the last 72 hours   Microbiology: No results found for this or any previous visit (from the past 720 hour(s)).  Medical History: Past Medical History  Diagnosis Date  . CORONARY HEART DISEASE     a. s/p CABG;  b.  cath 06/27/10: S-Dx occluded, S-PDA 80-90% (tx with PCI); S-OM ok, L-LAD ok;  EF 65-70%  c.  s/p Promus DES to S-PDA 06/2010;   d. Myoview 8/12: low risk  . FIBRILLATION, ATRIAL     post op; ?documented during hosp. 06/2010  . SLEEP APNEA 09/2001    NPSG AHI 22/HR  . ALLERGIC RHINITIS   . ASTHMA   . Depression   . GERD     with HH, hx esophageal stricture  . DYSLIPIDEMIA   . HYPERTENSION     Echo 3/12: EF 55-60%; mod LVH; mild AS/AI; LAE; PASP 38; mild pulmo HTN  . Personal history of alcoholism   . Diverticulosis   . Benign liver cyst   . Skin cancer     L forearm  . Cataract     surgery to both eyes  . Esophageal stricture   . Hiatal hernia     Medications:  Anti-infectives   Start     Dose/Rate Route Frequency Ordered Stop   05/05/13 1200  cefTRIAXone (ROCEPHIN) 1 g in  dextrose 5 % 50 mL IVPB  Status:  Discontinued     1 g 100 mL/hr over 30 Minutes Intravenous Every 24 hours 05/04/13 1022 05/04/13 1420   05/05/13 1200  azithromycin (ZITHROMAX) 500 mg in dextrose 5 % 250 mL IVPB     500 mg 250 mL/hr over 60 Minutes Intravenous Every 24 hours 05/04/13 1022     05/04/13 1500  ceFEPIme (MAXIPIME) 1 g in dextrose 5 % 50 mL IVPB     1 g 100 mL/hr over 30 Minutes Intravenous 3 times per day 05/04/13 1417 05/12/13 1359   05/04/13 1015  cefTRIAXone (ROCEPHIN) 1 g in dextrose 5 % 50 mL IVPB     1 g 100 mL/hr over 30 Minutes Intravenous  Once 05/04/13 1004 05/04/13 1108   05/04/13 1015  azithromycin (ZITHROMAX) 500 mg in dextrose 5 % 250 mL IVPB     500 mg 250 mL/hr over 60 Minutes Intravenous  Once 05/04/13 1004 05/04/13 1138     Assessment: 82 yom admitted 05/04/2013   to the ED with cough and SOB. To start empiric ceftriaxone + azithromycin for sepsis, now adding vancomycin.  Tmax is 104.1, CrCl 58   Azithro 1/25>> CTX 1/25>> Vanc 1/25  1/25 blood> 1/25 urine>  Goal of Therapy:  Eradication of infection  Plan:  -. Azithromycin 500mg  IV Q24H - Ceftriaxone 1gm IV Q24H -. Vancomyin 750 mg IV q12h -. Follow up SCr, UOP, cultures, clinical course and adjust as clinically indicated.  Thank you for allowing pharmacy to be a part of this patients care team.  Rowe Josephine Pharm.D., BCPS Clinical Pharmacist 05/04/2013 2:38 PM Pager: [phone] Phone: [phone]

## 2013-05-04 NOTE — ED Notes (Addendum)
2nd Blood culture drawn, Left AC 5ML

## 2013-05-04 NOTE — ED Notes (Signed)
Lactic acid results called to primary nurse Richardson Landry

## 2013-05-05 DIAGNOSIS — I5033 Acute on chronic diastolic (congestive) heart failure: Secondary | ICD-10-CM

## 2013-05-05 DIAGNOSIS — I509 Heart failure, unspecified: Secondary | ICD-10-CM

## 2013-05-05 DIAGNOSIS — I10 Essential (primary) hypertension: Secondary | ICD-10-CM

## 2013-05-05 LAB — CBC WITH DIFFERENTIAL/PLATELET
BASOS ABS: 0 10*3/uL (ref 0.0–0.1)
Basophils Relative: 0 % (ref 0–1)
EOS PCT: 0 % (ref 0–5)
Eosinophils Absolute: 0 10*3/uL (ref 0.0–0.7)
HEMATOCRIT: 32.5 % — AB (ref 39.0–52.0)
Hemoglobin: 10.8 g/dL — ABNORMAL LOW (ref 13.0–17.0)
LYMPHS PCT: 6 % — AB (ref 12–46)
Lymphs Abs: 0.8 10*3/uL (ref 0.7–4.0)
MCH: 29 pg (ref 26.0–34.0)
MCHC: 33.2 g/dL (ref 30.0–36.0)
MCV: 87.4 fL (ref 78.0–100.0)
MONO ABS: 0.7 10*3/uL (ref 0.1–1.0)
MONOS PCT: 5 % (ref 3–12)
Neutro Abs: 12.5 10*3/uL — ABNORMAL HIGH (ref 1.7–7.7)
Neutrophils Relative %: 90 % — ABNORMAL HIGH (ref 43–77)
Platelets: 131 10*3/uL — ABNORMAL LOW (ref 150–400)
RBC: 3.72 MIL/uL — ABNORMAL LOW (ref 4.22–5.81)
RDW: 16.3 % — ABNORMAL HIGH (ref 11.5–15.5)
WBC: 13.9 10*3/uL — ABNORMAL HIGH (ref 4.0–10.5)

## 2013-05-05 LAB — BASIC METABOLIC PANEL
BUN: 16 mg/dL (ref 6–23)
CO2: 23 meq/L (ref 19–32)
CREATININE: 0.83 mg/dL (ref 0.50–1.35)
Calcium: 8.5 mg/dL (ref 8.4–10.5)
Chloride: 107 mEq/L (ref 96–112)
GFR calc Af Amer: >90 mL/min (ref >90)
GFR calc non Af Amer: 81 mL/min — ABNORMAL LOW (ref >90)
Glucose, Bld: 163 mg/dL — ABNORMAL HIGH (ref 70–99)
Potassium: 4.4 mEq/L (ref 3.7–5.3)
Sodium: 144 mEq/L (ref 137–147)

## 2013-05-05 LAB — LEGIONELLA ANTIGEN, URINE: LEGIONELLA ANTIGEN, URINE: NEGATIVE

## 2013-05-05 LAB — URINE CULTURE: Colony Count: 8000

## 2013-05-05 LAB — TROPONIN I
Troponin I: <0.3 ng/mL (ref <0.30)
Troponin I: <0.3 ng/mL (ref <0.30)
Troponin I: <0.3 ng/mL (ref <0.30)

## 2013-05-05 LAB — VITAMIN B12: Vitamin B-12: 297 pg/mL (ref 211–911)

## 2013-05-05 MED ORDER — FERROUS SULFATE 325 (65 FE) MG PO TABS
325.0000 mg | ORAL_TABLET | Freq: Three times a day (TID) | ORAL | Status: DC
  Administered 2013-05-05 – 2013-05-07 (×7): 325 mg via ORAL
  Filled 2013-05-05 (×9): qty 1

## 2013-05-05 MED ORDER — CLONIDINE HCL 0.3 MG PO TABS
0.3000 mg | ORAL_TABLET | Freq: Two times a day (BID) | ORAL | Status: DC
  Administered 2013-05-05 (×2): 0.3 mg via ORAL
  Filled 2013-05-05 (×4): qty 1

## 2013-05-05 MED ORDER — CYANOCOBALAMIN 1000 MCG/ML IJ SOLN
1000.0000 ug | Freq: Every day | INTRAMUSCULAR | Status: DC
  Administered 2013-05-05 – 2013-05-07 (×3): 1000 ug via INTRAMUSCULAR
  Filled 2013-05-05 (×3): qty 1

## 2013-05-05 MED ORDER — TORSEMIDE 20 MG PO TABS
20.0000 mg | ORAL_TABLET | Freq: Every day | ORAL | Status: DC
  Administered 2013-05-05 – 2013-05-07 (×3): 20 mg via ORAL
  Filled 2013-05-05 (×3): qty 1

## 2013-05-05 MED ORDER — ONDANSETRON HCL 4 MG/2ML IJ SOLN
4.0000 mg | INTRAMUSCULAR | Status: DC | PRN
  Administered 2013-05-05 (×2): 4 mg via INTRAVENOUS
  Filled 2013-05-05 (×2): qty 2

## 2013-05-05 MED ORDER — CALCIUM CARBONATE ANTACID 500 MG PO CHEW
4.0000 | CHEWABLE_TABLET | Freq: Once | ORAL | Status: AC
  Administered 2013-05-05: 800 mg via ORAL
  Filled 2013-05-05: qty 4

## 2013-05-05 MED ORDER — DILTIAZEM HCL ER COATED BEADS 180 MG PO CP24
180.0000 mg | ORAL_CAPSULE | Freq: Two times a day (BID) | ORAL | Status: DC
  Administered 2013-05-05 – 2013-05-07 (×5): 180 mg via ORAL
  Filled 2013-05-05 (×9): qty 1

## 2013-05-05 MED ORDER — BISOPROLOL FUMARATE 10 MG PO TABS
10.0000 mg | ORAL_TABLET | Freq: Every day | ORAL | Status: DC
  Administered 2013-05-05 – 2013-05-07 (×3): 10 mg via ORAL
  Filled 2013-05-05 (×3): qty 1

## 2013-05-05 MED ORDER — HYDROCHLOROTHIAZIDE 25 MG PO TABS
25.0000 mg | ORAL_TABLET | Freq: Every day | ORAL | Status: DC
  Administered 2013-05-05: 25 mg via ORAL
  Filled 2013-05-05: qty 1

## 2013-05-05 MED ORDER — CLONIDINE HCL 0.3 MG PO TABS
0.3000 mg | ORAL_TABLET | Freq: Once | ORAL | Status: AC
  Administered 2013-05-05: 0.3 mg via ORAL
  Filled 2013-05-05: qty 1

## 2013-05-05 MED ORDER — LOSARTAN POTASSIUM 50 MG PO TABS
100.0000 mg | ORAL_TABLET | Freq: Every day | ORAL | Status: DC
  Administered 2013-05-05 – 2013-05-07 (×3): 100 mg via ORAL
  Filled 2013-05-05 (×3): qty 2

## 2013-05-05 NOTE — Progress Notes (Signed)
[facility] PROGRESS NOTE Interim History: 80 y.o. male  Presents with sudden onset cough, dyspnea, wheeze, chills. Woke up with symptoms last night. Nausea. Diarrhea. No sick contacts. Given NRB, albuterol en route. On arrival in ED, HR 140, temp 104, BP 103/48. CXR shows LLL consolidation, hazy right basilar infiltrate.   Assessment/Plan: Sepsis/HCAP (healthcare-associated pneumonia): - Started empirically in on Vanc and cefepime 1.25.2014. - HR improved with IV fluids, now KVO if fluids as he is in HF. - Spiking fevers cultures pending. - Troponin's negative.  Acute on chronic diastolic heart failure: - Lasix causes itching start torsemide. - Daily weight, strict I and O's, fluid restrict diet. - monitor electrolytes.  PAF (paroxysmal atrial fibrillation): - on elequis -  HR increase due to pain, cont diltiazem.  Benign hypertension: - Restart home medications. , losartan and diltiazem.d/c HCTZ start torsemide. - elevated today as Bp meds were held and due to pain.  Asthma with COPD: - stable no wheezing moving good air.  Code Status: full Family Communication: wife  Disposition Plan: inpatinet   Consultants:  none  Procedures:  CXR  Antibiotics:  vanc and cefepime 1.25.2014.  HPI/Subjective: Relates feels chest tight. SOB unchanged.  Objective: Filed Vitals:   05/05/13 0453 05/05/13 0913 05/05/13 1017 05/05/13 1020  BP: 169/64  218/82 192/76  Pulse: 94  105   Temp: 98 F (36.7 C)     TempSrc: Oral     Resp: 22     Height:      Weight: 75.433 kg (166 lb 4.8 oz)     SpO2: 95% 95% 97%     Intake/Output Summary (Last 24 hours) at 05/05/13 1041 Last data filed at 05/05/13 0620  Gross per 24 hour  Intake    240 ml  Output   1126 ml  Net   -886 ml   Filed Weights   05/04/13 0942 05/04/13 1420 05/05/13 0453  Weight: 72.576 kg (160 lb) 75.1 kg (165 lb 9.1 oz) 75.433 kg (166 lb 4.8 oz)    Exam:  General: Alert, awake, oriented x3, in no  acute distress.  HEENT: No bruits, no goiter. + JVD Heart: Regular rate and rhythm, without murmurs, rubs, gallops.  Lungs: Good air movement, crackles at right lung base. Abdomen: Soft, nontender, nondistended, positive bowel sounds.   Data Reviewed: Basic Metabolic Panel:  Recent Labs Lab 05/04/13 1008 05/05/13 0419  NA 142 144  K 4.1 4.4  CL 104 107  CO2 24 23  GLUCOSE 104* 163*  BUN 14 16  CREATININE 0.90 0.83  CALCIUM 8.7 8.5   Liver Function Tests:  Recent Labs Lab 05/04/13 1008  AST 25  ALT 31  ALKPHOS 91  BILITOT 0.6  PROT 6.2  ALBUMIN 3.7   No results found for this basename: LIPASE, AMYLASE,  in the last 168 hours No results found for this basename: AMMONIA,  in the last 168 hours CBC:  Recent Labs Lab 05/04/13 1008 05/05/13 0419  WBC 10.3 13.9*  NEUTROABS 8.0* 12.5*  HGB 12.1* 10.8*  HCT 36.5* 32.5*  MCV 87.5 87.4  PLT 172 131*   Cardiac Enzymes:  Recent Labs Lab 05/05/13 0419 05/05/13 0815  TROPONINI <0.30 <0.30   BNP (last 3 results)  Recent Labs  03/08/13 2023 03/14/13 1047 05/04/13 1008  PROBNP 236.4 755.0* 1749.0*   CBG: No results found for this basename: GLUCAP,  in the last 168 hours  Recent Results (from the past 240 hour(s))  CULTURE, BLOOD (ROUTINE X 2)  Status: None   Collection Time    05/04/13 10:09 AM      Result Value Range Status   Specimen Description BLOOD RIGHT WRIST   Final   Special Requests BOTTLES DRAWN AEROBIC AND ANAEROBIC [facility] EACH   Final   Culture  Setup Time     Final   Value: 05/04/2013 16:43     Performed at [facility]   Culture     Final   Value:        BLOOD CULTURE RECEIVED NO GROWTH TO DATE CULTURE WILL BE HELD FOR 5 DAYS BEFORE ISSUING A FINAL NEGATIVE REPORT     Performed at [facility]   Report Status PENDING   Incomplete  CULTURE, BLOOD (ROUTINE X 2)     Status: None   Collection Time    05/04/13 10:35 AM      Result Value Range Status   Specimen Description  BLOOD LEFT ARM   Final   Special Requests BOTTLES DRAWN AEROBIC AND ANAEROBIC 5CC EACH   Final   Culture  Setup Time     Final   Value: 05/04/2013 16:43     Performed at [facility]   Culture     Final   Value:        BLOOD CULTURE RECEIVED NO GROWTH TO DATE CULTURE WILL BE HELD FOR 5 DAYS BEFORE ISSUING A FINAL NEGATIVE REPORT     Performed at [facility]   Report Status PENDING   Incomplete     Studies: Dg Chest Port 1 View  05/04/2013   CLINICAL DATA:  Short of breath  EXAM: PORTABLE CHEST - 1 VIEW  COMPARISON:  03/14/2013  FINDINGS: Left mid and lower lung zone consolidation. Hazy airspace disease at the lateral right base. No pneumothorax. Mild cardiomegaly.  IMPRESSION: Left lower lobe consolidation. Hazy airspace disease at the right base.   Electronically Signed   By: Maryclare Bean M.D.   On: 05/04/2013 10:39    Scheduled Meds: . acidophilus  1 capsule Oral Daily  . apixaban  5 mg Oral BID  . azithromycin  500 mg Intravenous Q24H  . bisoprolol  10 mg Oral Daily  . budesonide  0.25 mg Nebulization BID  . ceFEPime (MAXIPIME) IV  1 g Intravenous Q8H  . cloNIDine  0.3 mg Oral BID  . dextromethorphan-guaiFENesin  1 tablet Oral BID  . diltiazem  180 mg Oral BID  . famotidine  20 mg Oral QHS  . ferrous sulfate  325 mg Oral TID WC  . hydrochlorothiazide  25 mg Oral Daily  . ketoconazole   Topical Daily  . latanoprost  1 drop Both Eyes QHS  . levalbuterol  0.63 mg Nebulization QID  . LORazepam  0.5 mg Oral BID  . losartan  100 mg Oral Daily  . mometasone-formoterol  2 puff Inhalation BID  . montelukast  10 mg Oral QHS  . vancomycin  750 mg Intravenous Q12H  . vitamin B-12  1,000 mcg Oral Daily   Continuous Infusions: . sodium chloride 75 mL/hr at 05/05/13 0246     Charlynne Cousins  [facility] Pager [phone]. If 8PM-8AM, please contact night-coverage at [email or web address], password [facility] 05/05/2013, 10:41 AM  LOS: 1 day

## 2013-05-05 NOTE — Progress Notes (Signed)
Patient stated he felt chest tightness and pressure across his mid chest.  Also states he feels nauseous.  Upon assessment, patient's face was red.  Patient states he is continuing to experience shortness of breath at rest.  Blood pressure 218/82, pulse 105 via Dinamap.  Rechecked manually, blood pressure 192/76, pulse 109.  Scheduled blood pressure medications given to patient.  PRN zofran administered for nausea.  MD made aware, no additional orders at this time.  Will continue to monitor.

## 2013-05-05 NOTE — Progress Notes (Signed)
On-call hospitalist-Reidler,PA gave orders for clonidine and Tums and to not give clonidine if systolic blood pressure below 170. If below 549 systolically then call doctor with results.. Both medications were given as ordered. Blood pressure at time of administering clonidine- 179/62. Currently patient stated he feels a lot better and patient also looks a lot better. Will continue to monitor patient to end of shift.

## 2013-05-05 NOTE — Progress Notes (Signed)
Patient refused CPAP.  Patient will have nurse call me if he changes his mind.

## 2013-05-05 NOTE — Progress Notes (Signed)
Notified patient was having chest pain. Immediately went into room and patient was vomiting. Charge nurse called MD for an IV dose of zofran. Patient stated chest pain between a 4-5/10. Blood pressure still elevated after hydralazine given about 96min prior to event. Blood pressure actually increased to 211/79. EKG done. Nitroglycerin tab not given per protocol for heart rate does not meet parameters for it to be given. HR was and still is 111. Text page on call doctor to make aware of this event. Patient states now at this time his chest pain is totally gone and states that he does have a history of angina and is always resolved with one nitroglycerin tab and four Tums.  Also confirmed with CMT that at actual time of when patient had chest pain, about 0030, patient had accelerated Junctional beats in which the EKG stated that as well. Will continue to monitor patient to end of shift.

## 2013-05-05 NOTE — Evaluation (Signed)
Physical Therapy Evaluation Patient Details Name: [name]. MRN: [id number] DOB: [birth year] Today's Date: 05/05/2013 Time: 0102-7253 PT Time Calculation (min): 25 min  PT Assessment / Plan / Recommendation History of Present Illness  Pt presents with SOB and found to have PNA in setting of asthma/ CHF.  Clinical Impression  Pt is mobilizing at mod I level and O2 sats remained 95% and above on RA with exertion. No current acute or f/u PT needs, PT signing off.    PT Assessment  Patent does not need any further PT services    Follow Up Recommendations  No PT follow up    Does the patient have the potential to tolerate intense rehabilitation      Barriers to Discharge        Equipment Recommendations  None recommended by PT    Recommendations for Other Services     Frequency      Precautions / Restrictions Precautions Precautions: None Restrictions Weight Bearing Restrictions: No   Pertinent Vitals/Pain O2 sats on 2L at rest: 99% O2 sats on RA at rest: 97% O2 sats on RA with ambulation: 95%      Mobility  Bed Mobility Overal bed mobility: Independent Transfers Overall transfer level: Modified independent Equipment used: None Ambulation/Gait Ambulation/Gait assistance: Modified independent (Device/Increase time) Ambulation Distance (Feet): 250 Feet Assistive device: None Gait Pattern/deviations: Step-through pattern;Decreased stride length Gait velocity: WFL Gait velocity interpretation: at or above normal speed for age/gender General Gait Details: pt ambulated with decreased step length due to not having shoes on and reporting that floor hurt bilateral feet    Exercises Other Exercises Other Exercises: discussed return to activity and self monitoring of breathing   PT Diagnosis:    PT Problem List:   PT Treatment Interventions:       PT Goals(Current goals can be found in the care plan section) Acute Rehab PT Goals Patient Stated Goal: return  home PT Goal Formulation: No goals set, d/c therapy  Visit Information  Last PT Received On: 05/05/13 Assistance Needed: +1 History of Present Illness: Pt presents with SOB and found to have PNA in setting of asthma/ CHF.       Prior [place] expects to be discharged to:: Private residence Living Arrangements: Spouse/significant other Available Help at Discharge: Friend(s);Available PRN/intermittently Type of Home: House Home Access: Stairs to enter [employer] of Steps: 2 Entrance Stairs-Rails: None Home Layout: One level Home Equipment: None Prior Function Level of Independence: Independent Comments: pt still drives, goes to silver sneakers 3x/ wk, cares for lawn  Communication Communication: No difficulties    Cognition  Cognition Arousal/Alertness: Awake/alert Behavior During Therapy: WFL for tasks assessed/performed Overall Cognitive Status: Within Functional Limits for tasks assessed    Extremity/Trunk Assessment Upper Extremity Assessment Upper Extremity Assessment: Overall WFL for tasks assessed Lower Extremity Assessment Lower Extremity Assessment: Overall WFL for tasks assessed Cervical / Trunk Assessment Cervical / Trunk Assessment: Kyphotic   Balance Balance Overall balance assessment: Modified Independent General Comments General comments (skin integrity, edema, etc.): swelling bilateral feet per pt reports  End of Session PT - End of Session Equipment Utilized During Treatment: Gait belt Activity Tolerance: Patient tolerated treatment well Patient left: in chair;with call bell/phone within reach Nurse Communication: Mobility status  GP   Leighton Roach, [place]  [place], [place] 05/05/2013, 12:53 PM

## 2013-05-06 LAB — BASIC METABOLIC PANEL
BUN: 19 mg/dL (ref 6–23)
CHLORIDE: 103 meq/L (ref 96–112)
CO2: 26 mEq/L (ref 19–32)
Calcium: 7.7 mg/dL — ABNORMAL LOW (ref 8.4–10.5)
Creatinine, Ser: 0.81 mg/dL (ref 0.50–1.35)
GFR calc non Af Amer: 82 mL/min — ABNORMAL LOW (ref >90)
Glucose, Bld: 103 mg/dL — ABNORMAL HIGH (ref 70–99)
POTASSIUM: 3 meq/L — AB (ref 3.7–5.3)
Sodium: 141 mEq/L (ref 137–147)

## 2013-05-06 LAB — CBC
HCT: 27.5 % — ABNORMAL LOW (ref 39.0–52.0)
Hemoglobin: 9 g/dL — ABNORMAL LOW (ref 13.0–17.0)
MCH: 28.7 pg (ref 26.0–34.0)
MCHC: 32.7 g/dL (ref 30.0–36.0)
MCV: 87.6 fL (ref 78.0–100.0)
PLATELETS: 131 10*3/uL — AB (ref 150–400)
RBC: 3.14 MIL/uL — ABNORMAL LOW (ref 4.22–5.81)
RDW: 16.4 % — AB (ref 11.5–15.5)
WBC: 10.6 10*3/uL — AB (ref 4.0–10.5)

## 2013-05-06 MED ORDER — CLONIDINE HCL 0.2 MG PO TABS
0.2000 mg | ORAL_TABLET | Freq: Every day | ORAL | Status: DC
  Administered 2013-05-06 – 2013-05-07 (×2): 0.2 mg via ORAL
  Filled 2013-05-06 (×2): qty 1

## 2013-05-06 MED ORDER — POTASSIUM CHLORIDE CRYS ER 20 MEQ PO TBCR
40.0000 meq | EXTENDED_RELEASE_TABLET | Freq: Two times a day (BID) | ORAL | Status: AC
  Administered 2013-05-06 (×2): 40 meq via ORAL
  Filled 2013-05-06 (×2): qty 2

## 2013-05-06 MED ORDER — AZITHROMYCIN 500 MG PO TABS
500.0000 mg | ORAL_TABLET | Freq: Every day | ORAL | Status: DC
  Administered 2013-05-06 – 2013-05-07 (×2): 500 mg via ORAL
  Filled 2013-05-06 (×2): qty 1

## 2013-05-06 MED ORDER — MAGNESIUM OXIDE 400 (241.3 MG) MG PO TABS
400.0000 mg | ORAL_TABLET | Freq: Two times a day (BID) | ORAL | Status: AC
  Administered 2013-05-06 (×2): 400 mg via ORAL
  Filled 2013-05-06 (×2): qty 1

## 2013-05-06 NOTE — Care Management Note (Signed)
    Page 1 of 1   05/06/2013     10:58:37 AM   CARE MANAGEMENT NOTE 05/06/2013  Patient:  [name], [name]   Account Number:  [id number]  Date Initiated:  05/06/2013  Documentation initiated by:  [facility]  Subjective/Objective Assessment:   80 y.o. male  Presents with sudden onset cough, dyspnea, wheeze, chills//Home with spouse     Action/Plan:   IV abx/ Home with self care   Anticipated DC Date:  05/07/2013   Anticipated DC Plan:  [place]  CM consult      Choice offered to / List presented to:             Status of service:   Medicare Important Message given?   (If response is "NO", the following Medicare IM given date fields will be blank) Date Medicare IM given:   Date Additional Medicare IM given:    Discharge Disposition:    Per UR Regulation:    If discussed at Long Length of Stay Meetings, dates discussed:    Comments:  05/06/13 [place], RN, BSN, NCM [phone] Contact Information: Payment,Ruth Spouse [phone])   [phone])

## 2013-05-06 NOTE — Progress Notes (Signed)
Patient alert and oriented.  Patient does not report pain. VSS.  Pt reported no pain.  Stable.

## 2013-05-06 NOTE — Progress Notes (Signed)
[facility] PROGRESS NOTE Interim History: 80 y.o. male  Presents with sudden onset cough, dyspnea, wheeze, chills. Woke up with symptoms last night. Nausea. Diarrhea. No sick contacts. Given NRB, albuterol en route. On arrival in ED, HR 140, temp 104, BP 103/48. CXR shows LLL consolidation, hazy right basilar infiltrate.   Assessment/Plan: Sepsis/HCAP (healthcare-associated pneumonia): - Started empirically in on Vanc and cefepime 1.25.2014. - KVO if fluids as he is in HF. - Defervesce,  cultures negative - Troponin's negative.  Acute on chronic diastolic heart failure: - Lasix causes itching start torsemide. - Daily weight, strict I and O's, fluid restrict diet. + JVD lungs clear. - monitor electrolytes. Replete potasium and mag  PAF (paroxysmal atrial fibrillation): - on elequis -  HR increase due to pain, cont diltiazem.  Benign hypertension: - Restart home medications. , losartan and diltiazem.d/c HCTZ start torsemide. - elevated today as Bp meds were held and due to pain. - tapare off over 3 days clonidine.  Asthma with COPD: - stable no wheezing moving good air.  Code Status: full Family Communication: wife  Disposition Plan: inpatinet   Consultants:  none  Procedures:  CXR  Antibiotics:  vanc and cefepime 1.25.2014.  HPI/Subjective: SOB improved.  Objective: Filed Vitals:   05/05/13 2007 05/06/13 0140 05/06/13 0612 05/06/13 0913  BP: 136/68 140/80 134/54   Pulse: 75  73   Temp: 98 F (36.7 C)  97.8 F (36.6 C)   TempSrc: Oral  Oral   Resp: 20  20   Height:      Weight:   72.2 kg (159 lb 2.8 oz)   SpO2: 95%  94% 99%    Intake/Output Summary (Last 24 hours) at 05/06/13 1024 Last data filed at 05/06/13 0900  Gross per 24 hour  Intake    720 ml  Output   4120 ml  Net  -3400 ml   Filed Weights   05/05/13 0453 05/05/13 1103 05/06/13 0612  Weight: 75.433 kg (166 lb 4.8 oz) 75.433 kg (166 lb 4.8 oz) 72.2 kg (159 lb 2.8 oz)     Exam:  General: Alert, awake, oriented x3, in no acute distress.  HEENT: No bruits, no goiter. + JVD Heart: Regular rate and rhythm, without murmurs, rubs, gallops.  Lungs: Good air movement, clear to auscultation. Abdomen: Soft, nontender, nondistended, positive bowel sounds.   Data Reviewed: Basic Metabolic Panel:  Recent Labs Lab 05/04/13 1008 05/05/13 0419 05/06/13 0542  NA 142 144 141  K 4.1 4.4 3.0*  CL 104 107 103  CO2 24 23 26   GLUCOSE 104* 163* 103*  BUN 14 16 19   CREATININE 0.90 0.83 0.81  CALCIUM 8.7 8.5 7.7*   Liver Function Tests:  Recent Labs Lab 05/04/13 1008  AST 25  ALT 31  ALKPHOS 91  BILITOT 0.6  PROT 6.2  ALBUMIN 3.7   No results found for this basename: LIPASE, AMYLASE,  in the last 168 hours No results found for this basename: AMMONIA,  in the last 168 hours CBC:  Recent Labs Lab 05/04/13 1008 05/05/13 0419 05/06/13 0542  WBC 10.3 13.9* 10.6*  NEUTROABS 8.0* 12.5*  --   HGB 12.1* 10.8* 9.0*  HCT 36.5* 32.5* 27.5*  MCV 87.5 87.4 87.6  PLT 172 131* 131*   Cardiac Enzymes:  Recent Labs Lab 05/05/13 0419 05/05/13 0815 05/05/13 1340  TROPONINI <0.30 <0.30 <0.30   BNP (last 3 results)  Recent Labs  03/08/13 2023 03/14/13 1047 05/04/13 1008  PROBNP 236.4 755.0* 1749.0*  CBG: No results found for this basename: GLUCAP,  in the last 168 hours  Recent Results (from the past 240 hour(s))  CULTURE, BLOOD (ROUTINE X 2)     Status: None   Collection Time    05/04/13 10:09 AM      Result Value Range Status   Specimen Description BLOOD RIGHT WRIST   Final   Special Requests BOTTLES DRAWN AEROBIC AND ANAEROBIC 5CC EACH   Final   Culture  Setup Time     Final   Value: 05/04/2013 16:43     Performed at [facility]   Culture     Final   Value:        BLOOD CULTURE RECEIVED NO GROWTH TO DATE CULTURE WILL BE HELD FOR 5 DAYS BEFORE ISSUING A FINAL NEGATIVE REPORT     Performed at [facility]   Report Status  PENDING   Incomplete  URINE CULTURE     Status: None   Collection Time    05/04/13 10:10 AM      Result Value Range Status   Specimen Description URINE, CLEAN CATCH   Final   Special Requests NONE   Final   Culture  Setup Time     Final   Value: 05/04/2013 17:00     Performed at [place]     Final   Value: 8,000 COLONIES/ML     Performed at [facility]   Culture     Final   Value: INSIGNIFICANT GROWTH     Performed at [facility]   Report Status 05/05/2013 FINAL   Final  CULTURE, BLOOD (ROUTINE X 2)     Status: None   Collection Time    05/04/13 10:35 AM      Result Value Range Status   Specimen Description BLOOD LEFT ARM   Final   Special Requests BOTTLES DRAWN AEROBIC AND ANAEROBIC 5CC EACH   Final   Culture  Setup Time     Final   Value: 05/04/2013 16:43     Performed at [facility]   Culture     Final   Value:        BLOOD CULTURE RECEIVED NO GROWTH TO DATE CULTURE WILL BE HELD FOR 5 DAYS BEFORE ISSUING A FINAL NEGATIVE REPORT     Performed at [facility]   Report Status PENDING   Incomplete     Studies: Dg Chest Port 1 View  05/04/2013   CLINICAL DATA:  Short of breath  EXAM: PORTABLE CHEST - 1 VIEW  COMPARISON:  03/14/2013  FINDINGS: Left mid and lower lung zone consolidation. Hazy airspace disease at the lateral right base. No pneumothorax. Mild cardiomegaly.  IMPRESSION: Left lower lobe consolidation. Hazy airspace disease at the right base.   Electronically Signed   By: Maryclare Bean M.D.   On: 05/04/2013 10:39    Scheduled Meds: . acidophilus  1 capsule Oral Daily  . apixaban  5 mg Oral BID  . azithromycin  500 mg Intravenous Q24H  . bisoprolol  10 mg Oral Daily  . budesonide  0.25 mg Nebulization BID  . ceFEPime (MAXIPIME) IV  1 g Intravenous Q8H  . cloNIDine  0.3 mg Oral BID  . cyanocobalamin  1,000 mcg Intramuscular Daily  . dextromethorphan-guaiFENesin  1 tablet Oral BID  . diltiazem  180 mg Oral  BID  . famotidine  20 mg Oral QHS  . ferrous sulfate  325  mg Oral TID WC  . ketoconazole   Topical Daily  . latanoprost  1 drop Both Eyes QHS  . levalbuterol  0.63 mg Nebulization QID  . LORazepam  0.5 mg Oral BID  . losartan  100 mg Oral Daily  . mometasone-formoterol  2 puff Inhalation BID  . montelukast  10 mg Oral QHS  . torsemide  20 mg Oral Daily  . vancomycin  750 mg Intravenous Q12H  . vitamin B-12  1,000 mcg Oral Daily   Continuous Infusions:     Charlynne Cousins  [facility] Pager [phone]. If 8PM-8AM, please contact night-coverage at [email or web address], password [facility] 05/06/2013, 10:24 AM  LOS: 2 days

## 2013-05-06 NOTE — Progress Notes (Signed)
Patient refusing cpap at this time

## 2013-05-07 ENCOUNTER — Other Ambulatory Visit: Payer: Medicare Other

## 2013-05-07 DIAGNOSIS — J449 Chronic obstructive pulmonary disease, unspecified: Secondary | ICD-10-CM

## 2013-05-07 DIAGNOSIS — I10 Essential (primary) hypertension: Secondary | ICD-10-CM

## 2013-05-07 DIAGNOSIS — K219 Gastro-esophageal reflux disease without esophagitis: Secondary | ICD-10-CM

## 2013-05-07 DIAGNOSIS — K222 Esophageal obstruction: Secondary | ICD-10-CM

## 2013-05-07 LAB — BASIC METABOLIC PANEL
BUN: 19 mg/dL (ref 6–23)
CALCIUM: 9 mg/dL (ref 8.4–10.5)
CHLORIDE: 100 meq/L (ref 96–112)
CO2: 29 meq/L (ref 19–32)
Creatinine, Ser: 0.91 mg/dL (ref 0.50–1.35)
GFR calc Af Amer: >90 mL/min (ref >90)
GFR calc non Af Amer: 78 mL/min — ABNORMAL LOW (ref >90)
Glucose, Bld: 104 mg/dL — ABNORMAL HIGH (ref 70–99)
POTASSIUM: 3.8 meq/L (ref 3.7–5.3)
SODIUM: 143 meq/L (ref 137–147)

## 2013-05-07 MED ORDER — LEVALBUTEROL HCL 0.63 MG/3ML IN NEBU
0.6300 mg | INHALATION_SOLUTION | Freq: Four times a day (QID) | RESPIRATORY_TRACT | Status: DC
  Administered 2013-05-07: 0.63 mg via RESPIRATORY_TRACT
  Filled 2013-05-07: qty 3

## 2013-05-07 MED ORDER — POLYETHYLENE GLYCOL 3350 17 G PO PACK
17.0000 g | PACK | Freq: Every day | ORAL | Status: DC
  Administered 2013-05-07: 17 g via ORAL
  Filled 2013-05-07: qty 1

## 2013-05-07 MED ORDER — LEVOFLOXACIN 750 MG PO TABS: 750.0000 mg | ORAL_TABLET | Freq: Every day | ORAL | Status: DC

## 2013-05-07 MED ORDER — TORSEMIDE 20 MG PO TABS: 20.0000 mg | ORAL_TABLET | Freq: Every day | ORAL | Status: DC

## 2013-05-07 MED ORDER — DOCUSATE SODIUM 50 MG PO CAPS
50.0000 mg | ORAL_CAPSULE | Freq: Two times a day (BID) | ORAL | Status: DC
  Administered 2013-05-07: 50 mg via ORAL
  Filled 2013-05-07 (×4): qty 1

## 2013-05-07 MED ORDER — CYANOCOBALAMIN 1000 MCG PO TABS: 1000.0000 ug | ORAL_TABLET | Freq: Every day | ORAL | Status: DC

## 2013-05-07 NOTE — Progress Notes (Signed)
Patient evaluated for community based chronic disease management services with [facility] as a benefit of patient's [employer]. Spoke with patient at bedside to explain [facility] services.  Patient is currently managed by his [facility] Telephonic Nurse.  He is knowledgeable of how to use his long and short acting inhalers, is able to get to all appointments, can afford medicines, and can get same day sick appointments with Dr Annamaria Boots without difficulty.  Left contact information and [facility] literature at bedside. Made Inpatient Case Manager aware that [facility] Management following. Of note, [facility] Care Management services does not replace or interfere with any services that are arranged by inpatient case management or social work.  For additional questions or referrals please contact Corliss Blacker BSN RN [facility] Liaison at [phone].

## 2013-05-07 NOTE — Progress Notes (Signed)
D/c instructions given to pt. Pt stable. Leaving via wheelchair.

## 2013-05-07 NOTE — Discharge Summary (Signed)
Physician Discharge Summary  [name]. RK:[id number] DOB: 1930-08-04 DOA: 05/04/2013  PCP: Gwendolyn Grant, MD  Admit date: 05/04/2013 Discharge date: 05/07/2013  Time spent: >30 minutes  Recommendations for Outpatient Follow-up:  BMET to follow electrolytes and renal function Reassess BP and adjust medications as needed  Discharge Diagnoses:  Acute resp failure due to HCAP (healthcare-associated pneumonia) and CHF exacerbation DYSLIPIDEMIA Asthma with COPD GERD Acute on chronic diastolic congestive heart failure PAF (paroxysmal atrial fibrillation) Benign hypertension Hypokalemia   Discharge Condition: stable and improved. Will discharge home. Patient to follow with PCP in 10 days.  Diet recommendation: heart healthy/low sodium diet  Filed Weights   05/05/13 1103 05/06/13 0612 05/07/13 0454  Weight: 75.433 kg (166 lb 4.8 oz) 72.2 kg (159 lb 2.8 oz) 70.4 kg (155 lb 3.3 oz)    History of present illness:  80 y.o. male Presents with sudden onset cough, dyspnea, wheeze, chills. Woke up with symptoms last night. Nausea. Diarrhea. No sick contacts. Given NRB, albuterol en route. On arrival in ED, HR 140, temp 104, BP 103/48. CXR shows LLL consolidation, hazy right basilar infiltrate. WBC normal. Flu PCR negative. After 2.5 L saline, HR down to 100. BP now 108/50. Feels better. Now on nasal cannula. Known to me from previous hospitalization 1.5 months ago.   Hospital Course:  Acute resp failure due to HCAP (healthcare-associated pneumonia) and acute on chronic diastolic CHF:  - Started empirically in on Vanc and cefepime 1.25.2014 for 4 days -no fever and WBC's essentially WNL at discharge.  -cultures negative  -will discharge on Levaquin to finish antibiotic therapy  Acute on chronic diastolic heart failure:  - Lasix causes itching; will discharge him on torsemide.  -good diuresis while inpatient, breathing significantly improved -advise to follow low sodium diet -  Daily weight,  -at discharge no JVD and lungs clear.  -Troponin's negative.   PAF (paroxysmal atrial fibrillation):  - on elequis and diltiazem - HR stable at discharge  Benign hypertension:  - continue home medications, losartan, clonidine and diltiazem; HCTZ discontinue, will use torsemide instead.   -advise to follow low sodium diet  Asthma with COPD:  - stable no wheezing moving good air. -continue home regimen  *Rest of medical problems remains stable and the plan is to continue current medication regimen  Procedures:  See below for x-ray reports   Consultations:  None   Discharge Exam: Filed Vitals:   05/07/13 1500  BP: 136/91  Pulse: 84  Temp: 98 F (36.7 C)  Resp: 18   General: Alert, awake, oriented x3, in no acute distress.  HEENT: No bruits, no goiter. negative JVD or thyromegaly  Heart: Regular rate and rhythm, without murmurs, rubs, gallops. No Le edema Lungs: Good air movement, clear to auscultation.  Abdomen: Soft, nontender, nondistended, positive bowel sounds.    Discharge Instructions  Discharge Orders   Future Appointments Provider Department Dept Phone   05/19/2013 2:20 PM Sharmon Revere [place] [facility] [phone]   05/30/2013 1:45 PM Minus Breeding, MD [place] [facility] [phone]   07/11/2013 9:15 AM Deneise Lever, MD [facility] [phone]   Future Orders Complete By Expires   Diet - low sodium heart healthy  As directed    Discharge instructions  As directed    Comments:     Follow a low sodium diet (less than 2 grams daily) Take medications as prescribed Arrange follow up with PCP in 10 days Keep yourself hydrated  Medication List    STOP taking these medications       hydrochlorothiazide 25 MG tablet  Commonly known as:  HYDRODIURIL      TAKE these medications       acidophilus Caps capsule  Take 1 capsule by mouth daily. [facility] probiotics     albuterol 108 (90  BASE) MCG/ACT inhaler  Commonly known as:  PROVENTIL HFA;VENTOLIN HFA  Inhale into the lungs every 6 (six) hours as needed for wheezing or shortness of breath.     bisoprolol 10 MG tablet  Commonly known as:  ZEBETA  Take 1 tablet (10 mg total) by mouth daily.     budesonide 0.25 MG/2ML nebulizer solution  Commonly known as:  PULMICORT  Take 0.25 mg by nebulization 2 (two) times daily as needed. For asthma     calcium-vitamin D 500-200 MG-UNIT per tablet  Commonly known as:  OSCAL WITH D  Take 1 tablet by mouth 2 (two) times daily.     cloNIDine 0.3 MG tablet  Commonly known as:  CATAPRES  Take 1 tablet (0.3 mg total) by mouth 2 (two) times daily.     cyanocobalamin 1000 MCG tablet  Take 1 tablet (1,000 mcg total) by mouth daily.     denosumab 60 MG/ML Soln injection  Commonly known as:  PROLIA  Inject 60 mg into the skin every 6 (six) months. Administer in upper arm, thigh, or abdomen     diltiazem 180 MG 24 hr capsule  Commonly known as:  CARDIZEM CD  Take 180 mg by mouth 2 (two) times daily.     DUONEB 0.5-2.5 (3) MG/3ML Soln  Generic drug:  ipratropium-albuterol  Take 3 mLs by nebulization every 6 (six) hours as needed (for shortness of breath).     ELIQUIS 5 MG Tabs tablet  Generic drug:  apixaban  Take 5 mg by mouth 2 (two) times daily.     famotidine 20 MG tablet  Commonly known as:  PEPCID  Take 20 mg by mouth at bedtime.     ferrous sulfate 325 (65 FE) MG tablet  Take 1 tablet (325 mg total) by mouth 3 (three) times daily with meals.     latanoprost 0.005 % ophthalmic solution  Commonly known as:  XALATAN  Place 1 drop into both eyes at bedtime.     levofloxacin 750 MG tablet  Commonly known as:  LEVAQUIN  Take 1 tablet (750 mg total) by mouth daily.     loperamide 2 MG tablet  Commonly known as:  IMODIUM A-D  Take 2 mg by mouth as needed. For loose stool     LORazepam 0.5 MG tablet  Commonly known as:  ATIVAN  Take 0.5 mg by mouth 2 (two) times  daily.     losartan 100 MG tablet  Commonly known as:  COZAAR  Take 100 mg by mouth daily.     mometasone-formoterol 100-5 MCG/ACT Aero  Commonly known as:  DULERA  Inhale 2 puffs into the lungs 2 (two) times daily.     montelukast 10 MG tablet  Commonly known as:  SINGULAIR  Take 10 mg by mouth at bedtime.     nitroGLYCERIN 0.4 MG SL tablet  Commonly known as:  NITROSTAT  Place 0.4 mg under the tongue every 5 (five) minutes as needed for chest pain.     omeprazole 20 MG capsule  Commonly known as:  PRILOSEC  Take 1 capsule (20 mg total) by mouth daily.  potassium chloride SA 20 MEQ tablet  Commonly known as:  K-DUR,KLOR-CON  Take 20 mEq by mouth 2 (two) times daily.     pravastatin 80 MG tablet  Commonly known as:  PRAVACHOL  Take 80 mg by mouth daily.     promethazine 25 MG tablet  Commonly known as:  PHENERGAN  Take 1 tablet (25 mg total) by mouth every 6 (six) hours as needed for nausea or vomiting.     torsemide 20 MG tablet  Commonly known as:  DEMADEX  Take 1 tablet (20 mg total) by mouth daily.       Allergies  Allergen Reactions  . Codeine Nausea And Vomiting  . Lasix [Furosemide] Itching       Follow-up Information   Follow up with Gwendolyn Grant, MD. Schedule an appointment as soon as possible for a visit in 10 days.   Specialty:  Internal Medicine   Contact information:   [address] [place] [address] [phone]        The results of significant diagnostics from this hospitalization (including imaging, microbiology, ancillary and laboratory) are listed below for reference.    Significant Diagnostic Studies: Dg Chest Port 1 View  05/04/2013   CLINICAL DATA:  Short of breath  EXAM: PORTABLE CHEST - 1 VIEW  COMPARISON:  03/14/2013  FINDINGS: Left mid and lower lung zone consolidation. Hazy airspace disease at the lateral right base. No pneumothorax. Mild cardiomegaly.  IMPRESSION: Left lower lobe  consolidation. Hazy airspace disease at the right base.   Electronically Signed   By: Maryclare Bean M.D.   On: 05/04/2013 10:39    Microbiology: Recent Results (from the past 240 hour(s))  CULTURE, BLOOD (ROUTINE X 2)     Status: None   Collection Time    05/04/13 10:09 AM      Result Value Range Status   Specimen Description BLOOD RIGHT WRIST   Final   Special Requests BOTTLES DRAWN AEROBIC AND ANAEROBIC [facility] EACH   Final   Culture  Setup Time     Final   Value: 05/04/2013 16:43     Performed at [facility]   Culture     Final   Value:        BLOOD CULTURE RECEIVED NO GROWTH TO DATE CULTURE WILL BE HELD FOR 5 DAYS BEFORE ISSUING A FINAL NEGATIVE REPORT     Performed at [facility]   Report Status PENDING   Incomplete  URINE CULTURE     Status: None   Collection Time    05/04/13 10:10 AM      Result Value Range Status   Specimen Description URINE, CLEAN CATCH   Final   Special Requests NONE   Final   Culture  Setup Time     Final   Value: 05/04/2013 17:00     Performed at [place]     Final   Value: 8,000 COLONIES/ML     Performed at [facility]   Culture     Final   Value: INSIGNIFICANT GROWTH     Performed at [facility]   Report Status 05/05/2013 FINAL   Final  CULTURE, BLOOD (ROUTINE X 2)     Status: None   Collection Time    05/04/13 10:35 AM      Result Value Range Status   Specimen Description BLOOD LEFT ARM   Final   Special  Requests BOTTLES DRAWN AEROBIC AND ANAEROBIC 5CC EACH   Final   Culture  Setup Time     Final   Value: 05/04/2013 16:43     Performed at [facility]   Culture     Final   Value:        BLOOD CULTURE RECEIVED NO GROWTH TO DATE CULTURE WILL BE HELD FOR 5 DAYS BEFORE ISSUING A FINAL NEGATIVE REPORT     Performed at [facility]   Report Status PENDING   Incomplete     Labs: Basic Metabolic Panel:  Recent Labs Lab 05/04/13 1008 05/05/13 0419 05/06/13 0542  05/07/13 0545  NA 142 144 141 143  K 4.1 4.4 3.0* 3.8  CL 104 107 103 100  CO2 24 23 26 29   GLUCOSE 104* 163* 103* 104*  BUN 14 16 19 19   CREATININE 0.90 0.83 0.81 0.91  CALCIUM 8.7 8.5 7.7* 9.0   Liver Function Tests:  Recent Labs Lab 05/04/13 1008  AST 25  ALT 31  ALKPHOS 91  BILITOT 0.6  PROT 6.2  ALBUMIN 3.7   CBC:  Recent Labs Lab 05/04/13 1008 05/05/13 0419 05/06/13 0542  WBC 10.3 13.9* 10.6*  NEUTROABS 8.0* 12.5*  --   HGB 12.1* 10.8* 9.0*  HCT 36.5* 32.5* 27.5*  MCV 87.5 87.4 87.6  PLT 172 131* 131*   Cardiac Enzymes:  Recent Labs Lab 05/05/13 0419 05/05/13 0815 05/05/13 1340  TROPONINI <0.30 <0.30 <0.30   BNP: BNP (last 3 results)  Recent Labs  03/08/13 2023 03/14/13 1047 05/04/13 1008  PROBNP 236.4 755.0* 1749.0*    Signed:  Destany Severns  [facility] 05/07/2013, 4:30 PM

## 2013-05-09 ENCOUNTER — Ambulatory Visit: Payer: Medicare Other | Admitting: Internal Medicine

## 2013-05-10 LAB — CULTURE, BLOOD (ROUTINE X 2)
Culture: NO GROWTH
Culture: NO GROWTH

## 2013-05-13 ENCOUNTER — Telehealth: Payer: Self-pay | Admitting: Cardiology

## 2013-05-13 NOTE — Telephone Encounter (Signed)
Walk In pt Form " [employer]" papers Dropped Off will give to [facility] On Thursday

## 2013-05-15 ENCOUNTER — Telehealth: Payer: Self-pay | Admitting: *Deleted

## 2013-05-15 NOTE — Telephone Encounter (Signed)
Per [facility] losartan 100 mg daily does not need a PA

## 2013-05-16 ENCOUNTER — Encounter: Payer: Self-pay | Admitting: Internal Medicine

## 2013-05-16 ENCOUNTER — Other Ambulatory Visit (INDEPENDENT_AMBULATORY_CARE_PROVIDER_SITE_OTHER): Payer: Medicare Other

## 2013-05-16 ENCOUNTER — Ambulatory Visit (INDEPENDENT_AMBULATORY_CARE_PROVIDER_SITE_OTHER): Payer: Medicare Other | Admitting: Internal Medicine

## 2013-05-16 VITALS — BP 132/80 | HR 57 | Temp 98.2°F | Wt 157.8 lb

## 2013-05-16 DIAGNOSIS — I480 Paroxysmal atrial fibrillation: Secondary | ICD-10-CM

## 2013-05-16 DIAGNOSIS — I5032 Chronic diastolic (congestive) heart failure: Secondary | ICD-10-CM

## 2013-05-16 DIAGNOSIS — J189 Pneumonia, unspecified organism: Secondary | ICD-10-CM

## 2013-05-16 DIAGNOSIS — I4891 Unspecified atrial fibrillation: Secondary | ICD-10-CM

## 2013-05-16 DIAGNOSIS — M810 Age-related osteoporosis without current pathological fracture: Secondary | ICD-10-CM

## 2013-05-16 DIAGNOSIS — D509 Iron deficiency anemia, unspecified: Secondary | ICD-10-CM | POA: Insufficient documentation

## 2013-05-16 LAB — CBC WITH DIFFERENTIAL/PLATELET
BASOS ABS: 0 10*3/uL (ref 0.0–0.1)
Basophils Relative: 0.2 % (ref 0.0–3.0)
Eosinophils Absolute: 0.1 10*3/uL (ref 0.0–0.7)
Eosinophils Relative: 0.7 % (ref 0.0–5.0)
HCT: 37.3 % — ABNORMAL LOW (ref 39.0–52.0)
Hemoglobin: 12.1 g/dL — ABNORMAL LOW (ref 13.0–17.0)
Lymphocytes Relative: 20.8 % (ref 12.0–46.0)
Lymphs Abs: 1.8 10*3/uL (ref 0.7–4.0)
MCHC: 32.4 g/dL (ref 30.0–36.0)
MCV: 88 fl (ref 78.0–100.0)
Monocytes Absolute: 0.7 10*3/uL (ref 0.1–1.0)
Monocytes Relative: 8.5 % (ref 3.0–12.0)
NEUTROS ABS: 6 10*3/uL (ref 1.4–7.7)
Neutrophils Relative %: 69.8 % (ref 43.0–77.0)
Platelets: 230 10*3/uL (ref 150.0–400.0)
RBC: 4.24 Mil/uL (ref 4.22–5.81)
RDW: 17.7 % — AB (ref 11.5–14.6)
WBC: 8.6 10*3/uL (ref 4.5–10.5)

## 2013-05-16 MED ORDER — DENOSUMAB 60 MG/ML ~~LOC~~ SOLN
60.0000 mg | Freq: Once | SUBCUTANEOUS | Status: AC
  Administered 2013-05-16: 60 mg via SUBCUTANEOUS

## 2013-05-16 NOTE — Assessment & Plan Note (Signed)
Exacerbated by chronic anticoagulation -Plavix + ASA, changes to Eliquis 03/2013 GI eval 04/2013: felt patient high risk for repeat endoscopic evaluation at this time [facility] monitor serial CBC, encouraged continue oral iron and PPI (prev H2B before 04/2013 GI eval) Iron/TIBC/Ferritin    Component Value Date/Time   IRON 37* 04/21/2013 0957   IRON 37* 04/21/2013 0957   FERRITIN 17.1* 04/21/2013 0957

## 2013-05-16 NOTE — Assessment & Plan Note (Signed)
hosp for CAP LLL late 04/2013 - reviewed Completed Levaquin Chronic asthma/obstructive dz - dyspnea returned to baseline The current medical regimen is effective;  continue present plan and medications.

## 2013-05-16 NOTE — Progress Notes (Signed)
Subjective:    Patient ID: Stephen Door., male    DOB: 11/22/1930, 80 y.o.   MRN: [id number]  HPI  Patient here for hospital follow up: Admit date: 05/04/2013  Discharge date: 05/07/2013  Time spent: >30 minutes  Recommendations for Outpatient Follow-up:  BMET to follow electrolytes and renal function  Reassess BP and adjust medications as needed  Discharge Diagnoses:  Acute resp failure due to HCAP (healthcare-associated pneumonia) and CHF exacerbation  DYSLIPIDEMIA  Asthma with COPD  GERD  Acute on chronic diastolic congestive heart failure  PAF (paroxysmal atrial fibrillation)  Benign hypertension  Hypokalemia   Since home, feeling better - breathing back to baseline  Also reviewed chronic medical issues and other interval medical events  Past Medical History  Diagnosis Date  . CORONARY HEART DISEASE     a. s/p CABG;  b.  cath 06/27/10: S-Dx occluded, S-PDA 80-90% (tx with PCI); S-OM ok, L-LAD ok;  EF 65-70%  c.  s/p Promus DES to S-PDA 06/2010;   d. Myoview 8/12: low risk  . FIBRILLATION, ATRIAL     post op; ?documented during hosp. 06/2010  . SLEEP APNEA 09/2001    NPSG AHI 22/HR  . ALLERGIC RHINITIS   . ASTHMA   . Depression   . GERD     with HH, hx esophageal stricture  . DYSLIPIDEMIA   . HYPERTENSION     Echo 3/12: EF 55-60%; mod LVH; mild AS/AI; LAE; PASP 38; mild pulmo HTN  . Personal history of alcoholism   . Diverticulosis   . Benign liver cyst   . Skin cancer     L forearm  . Cataract     surgery to both eyes  . Esophageal stricture   . Hiatal hernia   . Pneumonia    Family History  Problem Relation Age of Onset  . COPD Sister   . Asthma Sister   . Emphysema Sister   . Hypertension Sister   . Hypertension Mother   . Colon cancer Neg Hx   . Heart disease Brother   . Heart disease Mother   . Heart disease Father    History  Substance Use Topics  . Smoking status: Former Smoker -- 3.00 packs/day for 40 years    Types: Cigarettes    Quit  date: 04/11/1979  . Smokeless tobacco: Never Used  . Alcohol Use: No    Review of Systems  Constitutional: Negative for fever, activity change, appetite change, fatigue and unexpected weight change.  Respiratory: Positive for shortness of breath (chronic DOE, at baseline). Negative for cough, chest tightness and wheezing.   Cardiovascular: Negative for chest pain, palpitations and leg swelling.  Neurological: Negative for dizziness, weakness and headaches.  Psychiatric/Behavioral: Negative for dysphoric mood. The patient is not nervous/anxious.   All other systems reviewed and are negative.       Objective:   Physical Exam  BP 132/80  Pulse 57  Temp(Src) 98.2 F (36.8 C) (Oral)  Wt 157 lb 12.8 oz (71.578 kg)  SpO2 97% Wt Readings from Last 3 Encounters:  05/16/13 157 lb 12.8 oz (71.578 kg)  05/07/13 155 lb 3.3 oz (70.4 kg)  04/29/13 167 lb (75.751 kg)    Constitutional: he appears well-developed and well-nourished. No distress. wife at side Neck: Normal range of motion. Neck supple. No JVD present. No thyromegaly present.  Cardiovascular: Normal rate, irregular rhythm and normal heart sounds.  No murmur heard. No BLE edema. Pulmonary/Chest: Effort normal and breath  sounds diminished air movement but clear. No respiratory distress. he has no wheezes.  Psychiatric: he has a normal mood and affect. His behavior is normal. Judgment and thought content normal.   Lab Results  Component Value Date   WBC 10.6* 05/06/2013   HGB 9.0* 05/06/2013   HCT 27.5* 05/06/2013   PLT 131* 05/06/2013   GLUCOSE 104* 05/07/2013   CHOL 123 10/16/2012   TRIG 111.0 10/16/2012   HDL 41.10 10/16/2012   LDLCALC 60 10/16/2012   ALT 31 05/04/2013   AST 25 05/04/2013   NA 143 05/07/2013   K 3.8 05/07/2013   CL 100 05/07/2013   CREATININE 0.91 05/07/2013   BUN 19 05/07/2013   CO2 29 05/07/2013   TSH 1.170 03/09/2013   PSA 0.62 10/10/2010   INR 1.19 05/04/2013   HGBA1C 6.4 10/16/2012    Dg Chest Port 1  View  05/04/2013   CLINICAL DATA:  Short of breath  EXAM: PORTABLE CHEST - 1 VIEW  COMPARISON:  03/14/2013  FINDINGS: Left mid and lower lung zone consolidation. Hazy airspace disease at the lateral right base. No pneumothorax. Mild cardiomegaly.  IMPRESSION: Left lower lobe consolidation. Hazy airspace disease at the right base.   Electronically Signed   By: Maryclare Bean M.D.   On: 05/04/2013 10:39       Assessment & Plan:   Problem List Items Addressed This Visit   Anemia, iron deficiency      Exacerbated by chronic anticoagulation -Plavix + ASA, changes to Eliquis 03/2013 GI eval 04/2013: felt patient high risk for repeat endoscopic evaluation at this time [facility] monitor serial CBC, encouraged continue oral iron and PPI (prev H2B before 04/2013 GI eval) Iron/TIBC/Ferritin    Component Value Date/Time   IRON 37* 04/21/2013 0957   IRON 37* 04/21/2013 0957   FERRITIN 17.1* 04/21/2013 0957       Chronic diastolic heart failure     Currently euvolemic - exacerbated by pulm dz, recent PNA and history of RVR Change Lasix to demadex 04/2013 due to ?lasix allergy (itch) Continue loop diuretic as ongoing, checking BMet today to monitor    Relevant Orders      Basic metabolic panel   Osteoporosis     No hx fracture, freq pred use due to COPD Discussed med options for tx 05/2012 -  given hx esophageal stricture, recommend to begin Prolia injections -  Wife presents letter of patient support program enrollment, ?insurance approval -  Administer 1st dose today (05/16/13) Continue WB exercise activity and take Ca+D BID Education on same provided at length today    Relevant Orders      Basic metabolic panel   PAF (paroxysmal atrial fibrillation)     hosp for same 02/2013 and again 03/2013 - reviewed highly symptomatic RVR - fatigue and dyspnea - but improving with normalizing HR Would consider tykosin if needed as other med options limitied (cards consult reviewed) reviewed current dilt dose and on  Zebeta - avoid other beta-blocker due to adv pulm disease using dig 0.125 prn HR>100 (given lack of other med choices) conitnue follow up closely with cards - continue anticoag - eliquis - in place of prior plavix +[id number] (since 03/10/13 DC) Echo 03/09/13: normal LV fx, no cor pulmonale; CT angio neg for PE and no hypoxia    Relevant Orders      CBC with Differential   Pneumonia - Primary     hosp for CAP LLL late 04/2013 - reviewed Completed Levaquin Chronic  asthma/obstructive dz - dyspnea returned to baseline The current medical regimen is effective;  continue present plan and medications.    Relevant Orders      Basic metabolic panel      CBC with Differential     Time spent with pt/family today 40 minutes, greater than 50% time spent counseling patient on recent hosp for PNA, CHF exac, AF, osteoporosis, anemia and medication review. Also review of prior records

## 2013-05-16 NOTE — Assessment & Plan Note (Addendum)
No hx fracture, freq pred use due to COPD Discussed med options for tx 05/2012 -  given hx esophageal stricture, recommend to begin Prolia injections -  Wife presents letter of patient support program enrollment, ?insurance approval -  Administer 1st dose today (05/16/13) Continue WB exercise activity and take Ca+D BID Education on same provided at length today

## 2013-05-16 NOTE — Progress Notes (Signed)
Pre-visit discussion using our clinic review tool. No additional management support is needed unless otherwise documented below in the visit note.  

## 2013-05-16 NOTE — Addendum Note (Signed)
Addended by: Earnstine Regal on: 05/16/2013 09:17 AM   Modules accepted: Orders

## 2013-05-16 NOTE — Assessment & Plan Note (Signed)
Currently euvolemic - exacerbated by pulm dz, recent PNA and history of RVR Change Lasix to demadex 04/2013 due to ?lasix allergy (itch) Continue loop diuretic as ongoing, checking BMet today to monitor

## 2013-05-16 NOTE — Assessment & Plan Note (Signed)
hosp for same 02/2013 and again 03/2013 - reviewed highly symptomatic RVR - fatigue and dyspnea - but improving with normalizing HR Would consider tykosin if needed as other med options limitied (cards consult reviewed) reviewed current dilt dose and on Zebeta - avoid other beta-blocker due to adv pulm disease using dig 0.125 prn HR>100 (given lack of other med choices) conitnue follow up closely with cards - continue anticoag - eliquis - in place of prior plavix +[id number] (since 03/10/13 DC) Echo 03/09/13: normal LV fx, no cor pulmonale; CT angio neg for PE and no hypoxia

## 2013-05-16 NOTE — Patient Instructions (Signed)
It was good to see you today.  We have reviewed your prior records including labs and tests today  Medications reviewed and updated: no changes recommended today  1st Prolia given today - will repeat this every 6 months for your bones  Test(s) ordered today. Your results will be released to [place] (or called to you) after review, usually within 72hours after test completion. If any changes need to be made, you will be notified at that same time.  Please schedule followup in 3 months, call sooner if problems.

## 2013-05-19 ENCOUNTER — Encounter: Payer: Self-pay | Admitting: Physician Assistant

## 2013-05-19 ENCOUNTER — Ambulatory Visit (INDEPENDENT_AMBULATORY_CARE_PROVIDER_SITE_OTHER): Payer: Medicare Other | Admitting: Physician Assistant

## 2013-05-19 VITALS — BP 130/80 | HR 58 | Ht 62.0 in | Wt 160.0 lb

## 2013-05-19 DIAGNOSIS — D649 Anemia, unspecified: Secondary | ICD-10-CM

## 2013-05-19 DIAGNOSIS — I2510 Atherosclerotic heart disease of native coronary artery without angina pectoris: Secondary | ICD-10-CM

## 2013-05-19 DIAGNOSIS — E785 Hyperlipidemia, unspecified: Secondary | ICD-10-CM

## 2013-05-19 DIAGNOSIS — J449 Chronic obstructive pulmonary disease, unspecified: Secondary | ICD-10-CM

## 2013-05-19 DIAGNOSIS — J4489 Other specified chronic obstructive pulmonary disease: Secondary | ICD-10-CM

## 2013-05-19 DIAGNOSIS — I4891 Unspecified atrial fibrillation: Secondary | ICD-10-CM

## 2013-05-19 DIAGNOSIS — I10 Essential (primary) hypertension: Secondary | ICD-10-CM

## 2013-05-19 DIAGNOSIS — I5032 Chronic diastolic (congestive) heart failure: Secondary | ICD-10-CM

## 2013-05-19 LAB — BASIC METABOLIC PANEL
BUN: 15 mg/dL (ref 6–23)
CO2: 29 meq/L (ref 19–32)
Calcium: 9.1 mg/dL (ref 8.4–10.5)
Chloride: 99 mEq/L (ref 96–112)
Creatinine, Ser: 1 mg/dL (ref 0.4–1.5)
GFR: 73.81 mL/min (ref >60.00)
GLUCOSE: 93 mg/dL (ref 70–99)
POTASSIUM: 4.6 meq/L (ref 3.5–5.1)
SODIUM: 132 meq/L — AB (ref 135–145)

## 2013-05-19 NOTE — Progress Notes (Signed)
[address], [place], [place]  [address] Phone: [phone] Fax:  [phone]  Date:  05/19/2013   ID:  [name]., DOB [birth year], MRN [id number]  PCP:  Gwendolyn Grant, MD  Cardiologist:  Dr. Minus Breeding     History of Present Illness: Stephen Minzey. is a 80 y.o. male with a hx of CAD, s/p CABG, s/p Promus DES to S-PDA in 06/2010, HTN, HL, PAD, severe chronic asthma (followed by Dr. Annamaria Boots).  Admitted in 02/2013 with tachycardia and PAF.  He reverted to NSR with rate control Rx.  CHADS2-VASc=4.  He was placed on Eliquis.   He did require further medication adjustments since d/c for rapid rates.  Event monitor in 03/2013 demonstrated NSR, sinus brady.  Last seen by Dr. Minus Breeding 04/17/13.  He adjusted his medications for HTN.  Follow up labs demonstrated worsening Hgb with Fe deficiency.  He was referred to Dr. Henrene Pastor.  He was switched to PPI. He was felt to be high risk for endoscopic evaluation and no further workup was planned.  He was then admitted 1/25-1/28 with volume excess in the setting of pneumonia.  He was diuresed and tx with antibiotics.  He has been seen in f/u with primary care (05/16/13).  F/u Hgb stable (improved).    Myoview (12/2010):  Ant apex, ant wall ant lat wall ischemia (mild), EF 69%.  This was a low risk study and med Rx was continued.   Carotid US (10/2012):  bilat 40-59% ICA, probable L subclavian stenosis with mild steal (repeat 1  Year).   Echo (03/09/13):  Mild LVH, EF 55-60%, trivial AI, MAC, mild LAE, mild RVE, mild RAE, PASP 41.  Chest CTA (03/09/13):  Neg for pulmonary embolus.   Event Monitor (03/2013):  NSR, sinus brady; no AFib.    He notes increased LE edema over the last several days. He denies any worsening dyspnea. He sleeps on 2 pillows chronically. Denies PND. He denies chest discomfort, syncope or palpitations.  Recent Labs: 10/16/2012: HDL Cholesterol 41.10; LDL (calc) 60  03/09/2013: TSH 1.170  05/04/2013: ALT 31; Pro B  Natriuretic peptide (BNP) 1749.0*  05/16/2013: Creatinine 1.0; Hemoglobin 12.1*; Potassium 4.6   Wt Readings from Last 3 Encounters:  05/19/13 160 lb (72.576 kg)  05/16/13 157 lb 12.8 oz (71.578 kg)  05/07/13 155 lb 3.3 oz (70.4 kg)     Past Medical History  Diagnosis Date  . CORONARY HEART DISEASE     a. s/p CABG;  b.  cath 06/27/10: S-Dx occluded, S-PDA 80-90% (tx with PCI); S-OM ok, L-LAD ok;  EF 65-70%  c.  s/p Promus DES to S-PDA 06/2010;   d. Myoview 8/12: low risk  . FIBRILLATION, ATRIAL     post op; ?documented during hosp. 06/2010  . SLEEP APNEA 09/2001    NPSG AHI 22/HR  . ALLERGIC RHINITIS   . ASTHMA   . Depression   . GERD     with HH, hx esophageal stricture  . DYSLIPIDEMIA   . HYPERTENSION     Echo 3/12: EF 55-60%; mod LVH; mild AS/AI; LAE; PASP 38; mild pulmo HTN  . Personal history of alcoholism   . Diverticulosis   . Benign liver cyst   . Skin cancer     L forearm  . Cataract     surgery to both eyes  . Esophageal stricture   . Hiatal hernia   . Pneumonia 04/2013 hosp    Current Outpatient  Prescriptions  Medication Sig Dispense Refill  . acidophilus (RISAQUAD) CAPS Take 1 capsule by mouth daily. [facility] probiotics      . albuterol (PROVENTIL HFA;VENTOLIN HFA) 108 (90 BASE) MCG/ACT inhaler Inhale into the lungs every 6 (six) hours as needed for wheezing or shortness of breath.      Marland Kitchen apixaban (ELIQUIS) 5 MG TABS tablet Take 5 mg by mouth 2 (two) times daily.      . bisoprolol (ZEBETA) 10 MG tablet Take 1 tablet (10 mg total) by mouth daily.  30 tablet  5  . budesonide (PULMICORT) 0.25 MG/2ML nebulizer solution Take 0.25 mg by nebulization 2 (two) times daily as needed. For asthma      . calcium-vitamin D (OSCAL WITH D) 500-200 MG-UNIT per tablet Take 1 tablet by mouth 2 (two) times daily.      . cloNIDine (CATAPRES) 0.3 MG tablet Take 1 tablet (0.3 mg total) by mouth 2 (two) times daily.  60 tablet  11  . denosumab (PROLIA) 60 MG/ML SOLN injection Inject 60  mg into the skin every 6 (six) months. Administer in upper arm, thigh, or abdomen  1 mL  5  . diltiazem (CARDIZEM CD) 180 MG 24 hr capsule Take 180 mg by mouth 2 (two) times daily.      . ferrous sulfate 325 (65 FE) MG tablet Take 1 tablet (325 mg total) by mouth 3 (three) times daily with meals.  90 tablet  3  . ipratropium-albuterol (DUONEB) 0.5-2.5 (3) MG/3ML SOLN Take 3 mLs by nebulization every 6 (six) hours as needed (for shortness of breath).      . latanoprost (XALATAN) 0.005 % ophthalmic solution Place 1 drop into both eyes at bedtime.       Marland Kitchen loperamide (IMODIUM A-D) 2 MG tablet Take 2 mg by mouth as needed. For loose stool      . LORazepam (ATIVAN) 0.5 MG tablet Take 0.5 mg by mouth 2 (two) times daily.      Marland Kitchen losartan (COZAAR) 100 MG tablet Take 100 mg by mouth daily.      . mometasone-formoterol (DULERA) 100-5 MCG/ACT AERO Inhale 2 puffs into the lungs 2 (two) times daily.      . montelukast (SINGULAIR) 10 MG tablet Take 10 mg by mouth at bedtime.      . nitroGLYCERIN (NITROSTAT) 0.4 MG SL tablet Place 0.4 mg under the tongue every 5 (five) minutes as needed for chest pain.      Marland Kitchen omeprazole (PRILOSEC) 20 MG capsule Take 1 capsule (20 mg total) by mouth daily.  60 capsule  6  . potassium chloride SA (K-DUR,KLOR-CON) 20 MEQ tablet Take 20 mEq by mouth 2 (two) times daily.      . pravastatin (PRAVACHOL) 80 MG tablet Take 80 mg by mouth daily.      . promethazine (PHENERGAN) 25 MG tablet Take 1 tablet (25 mg total) by mouth every 6 (six) hours as needed for nausea or vomiting.  30 tablet  0  . torsemide (DEMADEX) 20 MG tablet Take 1 tablet (20 mg total) by mouth daily.  30 tablet  1  . vitamin B-12 1000 MCG tablet Take 1 tablet (1,000 mcg total) by mouth daily.  30 tablet  1   No current facility-administered medications for this visit.    Allergies:   Codeine and Lasix   Social History:  The patient  reports that he quit smoking about 34 years ago. His smoking use included  Cigarettes. He has a  120 pack-year smoking history. He has never used smokeless tobacco. He reports that he does not drink alcohol or use illicit drugs.   Family History:  The patient's family history includes Asthma in his sister; COPD in his sister; Emphysema in his sister; Heart disease in his brother, father, and mother; Hypertension in his mother and sister. There is no history of Colon cancer.   ROS:  Please see the history of present illness.   He notes chronic cough and wheezing.  All other systems reviewed and negative.   PHYSICAL EXAM: VS:  BP 130/80  Pulse 58  Ht 5\' 2"  (1.575 m)  Wt 160 lb (72.576 kg)  BMI 29.26 kg/m2 Well nourished, well developed, in no acute distress HEENT: normal Neck: no JVD Cardiac:  distant S1, S2; RRR; no murmur Lungs:  Decreased breath sounds bilaterally, expiratory rhonchi throughout Abd: soft, nontender, no hepatomegaly Ext:  1+ bilateral LE edema Skin: warm and dry Neuro:  CNs 2-12 intact, no focal abnormalities noted  EKG:  Sinus bradycardia, HR 58, normal axis, nonspecific ST-T wave changes, first-degree AV block (PR 280), no significant change when compared to prior tracings    ASSESSMENT AND PLAN:  1. Atrial Fibrillation:  Maintaining NSR. He continues to tolerate Eliquis. Continue current therapy. 2. Severe Asthma:  F/u with pulmonary as planned.  3. CAD:  No angina.  He is not on aspirin as he is on Apixaban. Continue statin.  4. Chronic Diastolic CHF:  Volume appears stable. He does have worsening LE edema. I suspect that his LE edema is multifactorial. He does have elevated right heart pressures that are likely contributing to chronic edema. He may take an extra torsemide with an extra potassium for a couple of days to help with this. We discussed continuing to limit his salt as well as wearing compression stockings and keeping his legs elevated. 5. Hypertension:  Controlled. 6. Hyperlipidemia:  Continue statin. 7. Carotid Stenosis:   Follow up US due in 10/2013. 8. Recent Pneumonia:  Resolving.   9. Iron Deficiency Anemia:  He has seen GI.  No plans for endoscopy unless he has overt bleeding.  He remains on Iron.  Recent Hgb was improved.   10. Disposition:  Follow up with Dr. Percival Spanish in 2 mos.   Signed, Richardson Dopp, PA-C  05/19/2013 2:43 PM

## 2013-05-19 NOTE — Patient Instructions (Signed)
OK TO TAKE EXTRA TORSEMIDE TOMORROW AND WED. MAKE SURE TO TAKE EXTRA POTASSIUM TOMORROW AND WED.  FOLLOW UP WITH DR. HOCHREIN 07/21/13 1:45 PM

## 2013-05-30 ENCOUNTER — Ambulatory Visit: Payer: Medicare Other | Admitting: Cardiology

## 2013-06-02 ENCOUNTER — Telehealth: Payer: Self-pay | Admitting: Cardiology

## 2013-06-02 NOTE — Telephone Encounter (Signed)
Pt wife - HR is ranging for 43 to 74.  He has noticed HR dropping about an hour after taking his medications and it causes him to feel very lethargic.  He is taking medications as listed.  His BP has been 164/83, 122/81, 152/76, 121/77, 145/84, 139/81, and 185/86.  Wife aware I will forward this information to Dr Percival Spanish for review and call back with any orders.

## 2013-06-02 NOTE — Telephone Encounter (Signed)
New Message  Pt wife called states that the pt's heart rate is going down into the 40's (no readings)// She is requesting a call back to discuss.

## 2013-06-05 NOTE — Telephone Encounter (Signed)
We can change his Cardizem CD to 120 mg bid.  Disp number 60 with 11 refills.

## 2013-06-06 ENCOUNTER — Other Ambulatory Visit: Payer: Self-pay | Admitting: Cardiology

## 2013-06-06 MED ORDER — DILTIAZEM HCL ER COATED BEADS 120 MG PO CP24: 120.0000 mg | ORAL_CAPSULE | Freq: Two times a day (BID) | ORAL | Status: DC

## 2013-06-06 NOTE — Telephone Encounter (Signed)
Spoke with Mrs. Nishikawa.  Informed of Dr. Rosezella [place] rec to decrease cardizem to 120 mg BID. Pt already took 180 mg tab this am.  Will pick up new prescription and take new dose tonight.  His HR is in 50s this am.

## 2013-06-09 ENCOUNTER — Other Ambulatory Visit: Payer: Self-pay | Admitting: *Deleted

## 2013-06-09 MED ORDER — LOSARTAN POTASSIUM 100 MG PO TABS: 100.0000 mg | ORAL_TABLET | Freq: Every day | ORAL | Status: DC

## 2013-06-14 ENCOUNTER — Emergency Department (HOSPITAL_COMMUNITY): Payer: Medicare Other

## 2013-06-14 ENCOUNTER — Encounter (HOSPITAL_COMMUNITY): Payer: Self-pay | Admitting: Emergency Medicine

## 2013-06-14 ENCOUNTER — Inpatient Hospital Stay (HOSPITAL_COMMUNITY)
Admission: EM | Admit: 2013-06-14 | Discharge: 2013-06-17 | DRG: 193 | Disposition: A | Discharge status: Home / Self Care | Payer: Medicare Other | Attending: Internal Medicine | Admitting: Internal Medicine

## 2013-06-14 DIAGNOSIS — M79672 Pain in left foot: Secondary | ICD-10-CM

## 2013-06-14 DIAGNOSIS — R1110 Vomiting, unspecified: Secondary | ICD-10-CM

## 2013-06-14 DIAGNOSIS — Z888 Allergy status to other drugs, medicaments and biological substances status: Secondary | ICD-10-CM

## 2013-06-14 DIAGNOSIS — K859 Acute pancreatitis without necrosis or infection, unspecified: Secondary | ICD-10-CM

## 2013-06-14 DIAGNOSIS — I5033 Acute on chronic diastolic (congestive) heart failure: Secondary | ICD-10-CM | POA: Diagnosis present

## 2013-06-14 DIAGNOSIS — G4733 Obstructive sleep apnea (adult) (pediatric): Secondary | ICD-10-CM

## 2013-06-14 DIAGNOSIS — J45909 Unspecified asthma, uncomplicated: Secondary | ICD-10-CM | POA: Diagnosis present

## 2013-06-14 DIAGNOSIS — J3089 Other allergic rhinitis: Secondary | ICD-10-CM

## 2013-06-14 DIAGNOSIS — J449 Chronic obstructive pulmonary disease, unspecified: Secondary | ICD-10-CM

## 2013-06-14 DIAGNOSIS — R202 Paresthesia of skin: Secondary | ICD-10-CM

## 2013-06-14 DIAGNOSIS — I10 Essential (primary) hypertension: Secondary | ICD-10-CM

## 2013-06-14 DIAGNOSIS — A419 Sepsis, unspecified organism: Secondary | ICD-10-CM

## 2013-06-14 DIAGNOSIS — Z7901 Long term (current) use of anticoagulants: Secondary | ICD-10-CM

## 2013-06-14 DIAGNOSIS — I480 Paroxysmal atrial fibrillation: Secondary | ICD-10-CM

## 2013-06-14 DIAGNOSIS — I509 Heart failure, unspecified: Secondary | ICD-10-CM | POA: Diagnosis present

## 2013-06-14 DIAGNOSIS — R509 Fever, unspecified: Secondary | ICD-10-CM

## 2013-06-14 DIAGNOSIS — J4489 Other specified chronic obstructive pulmonary disease: Secondary | ICD-10-CM

## 2013-06-14 DIAGNOSIS — I4891 Unspecified atrial fibrillation: Secondary | ICD-10-CM

## 2013-06-14 DIAGNOSIS — Z9861 Coronary angioplasty status: Secondary | ICD-10-CM

## 2013-06-14 DIAGNOSIS — Z885 Allergy status to narcotic agent status: Secondary | ICD-10-CM

## 2013-06-14 DIAGNOSIS — Z87891 Personal history of nicotine dependence: Secondary | ICD-10-CM

## 2013-06-14 DIAGNOSIS — M79671 Pain in right foot: Secondary | ICD-10-CM

## 2013-06-14 DIAGNOSIS — I5031 Acute diastolic (congestive) heart failure: Secondary | ICD-10-CM

## 2013-06-14 DIAGNOSIS — M810 Age-related osteoporosis without current pathological fracture: Secondary | ICD-10-CM

## 2013-06-14 DIAGNOSIS — E785 Hyperlipidemia, unspecified: Secondary | ICD-10-CM

## 2013-06-14 DIAGNOSIS — Z951 Presence of aortocoronary bypass graft: Secondary | ICD-10-CM

## 2013-06-14 DIAGNOSIS — K222 Esophageal obstruction: Secondary | ICD-10-CM

## 2013-06-14 DIAGNOSIS — D509 Iron deficiency anemia, unspecified: Secondary | ICD-10-CM

## 2013-06-14 DIAGNOSIS — J189 Pneumonia, unspecified organism: Principal | ICD-10-CM

## 2013-06-14 DIAGNOSIS — I2510 Atherosclerotic heart disease of native coronary artery without angina pectoris: Secondary | ICD-10-CM

## 2013-06-14 DIAGNOSIS — Z85828 Personal history of other malignant neoplasm of skin: Secondary | ICD-10-CM

## 2013-06-14 DIAGNOSIS — K219 Gastro-esophageal reflux disease without esophagitis: Secondary | ICD-10-CM

## 2013-06-14 DIAGNOSIS — I5032 Chronic diastolic (congestive) heart failure: Secondary | ICD-10-CM

## 2013-06-14 DIAGNOSIS — K7689 Other specified diseases of liver: Secondary | ICD-10-CM

## 2013-06-14 DIAGNOSIS — J302 Other seasonal allergic rhinitis: Secondary | ICD-10-CM

## 2013-06-14 DIAGNOSIS — J45901 Unspecified asthma with (acute) exacerbation: Secondary | ICD-10-CM

## 2013-06-14 DIAGNOSIS — R200 Anesthesia of skin: Secondary | ICD-10-CM

## 2013-06-14 DIAGNOSIS — R0989 Other specified symptoms and signs involving the circulatory and respiratory systems: Secondary | ICD-10-CM

## 2013-06-14 DIAGNOSIS — G4730 Sleep apnea, unspecified: Secondary | ICD-10-CM | POA: Diagnosis present

## 2013-06-14 LAB — COMPREHENSIVE METABOLIC PANEL
ALT: 20 U/L (ref 0–53)
AST: 20 U/L (ref 0–37)
Albumin: 3.8 g/dL (ref 3.5–5.2)
Alkaline Phosphatase: 73 U/L (ref 39–117)
BUN: 12 mg/dL (ref 6–23)
CALCIUM: 9 mg/dL (ref 8.4–10.5)
CO2: 24 mEq/L (ref 19–32)
CREATININE: 0.88 mg/dL (ref 0.50–1.35)
Chloride: 102 mEq/L (ref 96–112)
GFR, EST NON AFRICAN AMERICAN: 79 mL/min — AB (ref >90)
GLUCOSE: 97 mg/dL (ref 70–99)
Potassium: 4 mEq/L (ref 3.7–5.3)
Sodium: 139 mEq/L (ref 137–147)
Total Bilirubin: 0.6 mg/dL (ref 0.3–1.2)
Total Protein: 6.5 g/dL (ref 6.0–8.3)

## 2013-06-14 LAB — CBC WITH DIFFERENTIAL/PLATELET
BASOS ABS: 0 10*3/uL (ref 0.0–0.1)
Basophils Relative: 0 % (ref 0–1)
EOS PCT: 0 % (ref 0–5)
Eosinophils Absolute: 0 10*3/uL (ref 0.0–0.7)
HCT: 39.8 % (ref 39.0–52.0)
Hemoglobin: 13.8 g/dL (ref 13.0–17.0)
Lymphocytes Relative: 5 % — ABNORMAL LOW (ref 12–46)
Lymphs Abs: 0.8 10*3/uL (ref 0.7–4.0)
MCH: 30.6 pg (ref 26.0–34.0)
MCHC: 34.7 g/dL (ref 30.0–36.0)
MCV: 88.2 fL (ref 78.0–100.0)
Monocytes Absolute: 1.3 10*3/uL — ABNORMAL HIGH (ref 0.1–1.0)
Monocytes Relative: 8 % (ref 3–12)
Neutro Abs: 14.2 10*3/uL — ABNORMAL HIGH (ref 1.7–7.7)
Neutrophils Relative %: 87 % — ABNORMAL HIGH (ref 43–77)
Platelets: 180 10*3/uL (ref 150–400)
RBC: 4.51 MIL/uL (ref 4.22–5.81)
RDW: 16 % — AB (ref 11.5–15.5)
WBC: 16.3 10*3/uL — ABNORMAL HIGH (ref 4.0–10.5)

## 2013-06-14 LAB — URINALYSIS, ROUTINE W REFLEX MICROSCOPIC
Bilirubin Urine: NEGATIVE
Glucose, UA: NEGATIVE mg/dL
Hgb urine dipstick: NEGATIVE
Ketones, ur: NEGATIVE mg/dL
Leukocytes, UA: NEGATIVE
NITRITE: NEGATIVE
Protein, ur: NEGATIVE mg/dL
SPECIFIC GRAVITY, URINE: 1.016 (ref 1.005–1.030)
Urobilinogen, UA: 1 mg/dL (ref 0.0–1.0)
pH: 6 (ref 5.0–8.0)

## 2013-06-14 LAB — INFLUENZA PANEL BY PCR (TYPE A & B)
H1N1FLUPCR: NOT DETECTED
Influenza A By PCR: NEGATIVE
Influenza B By PCR: NEGATIVE

## 2013-06-14 LAB — STREP PNEUMONIAE URINARY ANTIGEN: Strep Pneumo Urinary Antigen: NEGATIVE

## 2013-06-14 LAB — I-STAT CG4 LACTIC ACID, ED: Lactic Acid, Venous: 1.44 mmol/L (ref 0.5–2.2)

## 2013-06-14 MED ORDER — FERROUS SULFATE 325 (65 FE) MG PO TABS
325.0000 mg | ORAL_TABLET | Freq: Three times a day (TID) | ORAL | Status: DC
  Administered 2013-06-14 – 2013-06-17 (×9): 325 mg via ORAL
  Filled 2013-06-14 (×12): qty 1

## 2013-06-14 MED ORDER — PIPERACILLIN-TAZOBACTAM 3.375 G IVPB
3.3750 g | Freq: Three times a day (TID) | INTRAVENOUS | Status: DC
  Administered 2013-06-14 – 2013-06-15 (×2): 3.375 g via INTRAVENOUS
  Filled 2013-06-14 (×4): qty 50

## 2013-06-14 MED ORDER — PANTOPRAZOLE SODIUM 40 MG PO TBEC
40.0000 mg | DELAYED_RELEASE_TABLET | Freq: Every day | ORAL | Status: DC
  Administered 2013-06-14 – 2013-06-17 (×4): 40 mg via ORAL
  Filled 2013-06-14 (×4): qty 1

## 2013-06-14 MED ORDER — SODIUM CHLORIDE 0.9 % IV SOLN
INTRAVENOUS | Status: DC
  Administered 2013-06-14: 125 mL/h via INTRAVENOUS

## 2013-06-14 MED ORDER — BISOPROLOL FUMARATE 10 MG PO TABS
10.0000 mg | ORAL_TABLET | Freq: Every day | ORAL | Status: DC
  Administered 2013-06-14 – 2013-06-17 (×4): 10 mg via ORAL
  Filled 2013-06-14 (×4): qty 1

## 2013-06-14 MED ORDER — SODIUM CHLORIDE 0.9 % IV SOLN
1250.0000 mg | INTRAVENOUS | Status: DC
  Administered 2013-06-15: 1250 mg via INTRAVENOUS
  Filled 2013-06-14 (×2): qty 1250

## 2013-06-14 MED ORDER — LATANOPROST 0.005 % OP SOLN
1.0000 [drp] | Freq: Every day | OPHTHALMIC | Status: DC
Start: 2013-06-14 — End: 2013-06-17
  Administered 2013-06-14 – 2013-06-16 (×3): 1 [drp] via OPHTHALMIC
  Filled 2013-06-14: qty 2.5

## 2013-06-14 MED ORDER — LOSARTAN POTASSIUM 50 MG PO TABS
100.0000 mg | ORAL_TABLET | Freq: Every day | ORAL | Status: DC
  Administered 2013-06-14 – 2013-06-17 (×4): 100 mg via ORAL
  Filled 2013-06-14 (×4): qty 2

## 2013-06-14 MED ORDER — DILTIAZEM HCL ER COATED BEADS 120 MG PO CP24
120.0000 mg | ORAL_CAPSULE | Freq: Two times a day (BID) | ORAL | Status: DC
  Administered 2013-06-14 – 2013-06-17 (×6): 120 mg via ORAL
  Filled 2013-06-14 (×9): qty 1

## 2013-06-14 MED ORDER — POTASSIUM CHLORIDE CRYS ER 20 MEQ PO TBCR
20.0000 meq | EXTENDED_RELEASE_TABLET | Freq: Two times a day (BID) | ORAL | Status: DC
  Administered 2013-06-14 – 2013-06-16 (×4): 20 meq via ORAL
  Filled 2013-06-14 (×9): qty 1

## 2013-06-14 MED ORDER — MONTELUKAST SODIUM 10 MG PO TABS
10.0000 mg | ORAL_TABLET | Freq: Every day | ORAL | Status: DC
  Administered 2013-06-14 – 2013-06-16 (×3): 10 mg via ORAL
  Filled 2013-06-14 (×4): qty 1

## 2013-06-14 MED ORDER — SIMVASTATIN 40 MG PO TABS
40.0000 mg | ORAL_TABLET | Freq: Every day | ORAL | Status: DC
  Administered 2013-06-14: 40 mg via ORAL
  Filled 2013-06-14 (×2): qty 1

## 2013-06-14 MED ORDER — VITAMIN B-12 1000 MCG PO TABS
1000.0000 ug | ORAL_TABLET | Freq: Every day | ORAL | Status: DC
  Administered 2013-06-14 – 2013-06-17 (×4): 1000 ug via ORAL
  Filled 2013-06-14 (×4): qty 1

## 2013-06-14 MED ORDER — PROMETHAZINE HCL 25 MG PO TABS
25.0000 mg | ORAL_TABLET | Freq: Four times a day (QID) | ORAL | Status: DC | PRN
  Administered 2013-06-14: 25 mg via ORAL
  Filled 2013-06-14: qty 1

## 2013-06-14 MED ORDER — VANCOMYCIN HCL IN DEXTROSE 1-5 GM/200ML-% IV SOLN
1000.0000 mg | Freq: Once | INTRAVENOUS | Status: AC
  Administered 2013-06-14: 1000 mg via INTRAVENOUS
  Filled 2013-06-14: qty 200

## 2013-06-14 MED ORDER — BUDESONIDE 0.25 MG/2ML IN SUSP
0.2500 mg | Freq: Two times a day (BID) | RESPIRATORY_TRACT | Status: DC | PRN
  Filled 2013-06-14: qty 2

## 2013-06-14 MED ORDER — TORSEMIDE 20 MG PO TABS
20.0000 mg | ORAL_TABLET | Freq: Every day | ORAL | Status: DC
  Administered 2013-06-14: 20 mg via ORAL
  Filled 2013-06-14 (×2): qty 1

## 2013-06-14 MED ORDER — FAMOTIDINE 20 MG PO TABS
20.0000 mg | ORAL_TABLET | Freq: Every day | ORAL | Status: DC
  Administered 2013-06-14 – 2013-06-17 (×4): 20 mg via ORAL
  Filled 2013-06-14 (×4): qty 1

## 2013-06-14 MED ORDER — SODIUM CHLORIDE 0.9 % IV SOLN: 1250.0000 mg | INTRAVENOUS | Status: DC

## 2013-06-14 MED ORDER — LORAZEPAM 0.5 MG PO TABS
0.5000 mg | ORAL_TABLET | Freq: Two times a day (BID) | ORAL | Status: DC
  Administered 2013-06-14 – 2013-06-17 (×5): 0.5 mg via ORAL
  Filled 2013-06-14 (×5): qty 1

## 2013-06-14 MED ORDER — IPRATROPIUM-ALBUTEROL 0.5-2.5 (3) MG/3ML IN SOLN
3.0000 mL | Freq: Four times a day (QID) | RESPIRATORY_TRACT | Status: DC | PRN
Start: 2013-06-14 — End: 2013-06-17
  Administered 2013-06-14 – 2013-06-15 (×2): 3 mL via RESPIRATORY_TRACT
  Filled 2013-06-14 (×2): qty 3

## 2013-06-14 MED ORDER — CALCIUM CARBONATE-VITAMIN D 500-200 MG-UNIT PO TABS
1.0000 | ORAL_TABLET | Freq: Two times a day (BID) | ORAL | Status: DC
Start: 2013-06-14 — End: 2013-06-17
  Administered 2013-06-14 – 2013-06-17 (×6): 1 via ORAL
  Filled 2013-06-14 (×7): qty 1

## 2013-06-14 MED ORDER — CLONIDINE HCL 0.3 MG PO TABS
0.3000 mg | ORAL_TABLET | Freq: Two times a day (BID) | ORAL | Status: DC
  Administered 2013-06-14 – 2013-06-15 (×3): 0.3 mg via ORAL
  Filled 2013-06-14 (×5): qty 1

## 2013-06-14 MED ORDER — SODIUM CHLORIDE 0.9 % IV BOLUS (SEPSIS)
500.0000 mL | Freq: Once | INTRAVENOUS | Status: AC
  Administered 2013-06-14: 500 mL via INTRAVENOUS

## 2013-06-14 MED ORDER — ACETAMINOPHEN 325 MG PO TABS
650.0000 mg | ORAL_TABLET | Freq: Four times a day (QID) | ORAL | Status: DC | PRN
  Administered 2013-06-14: 650 mg via ORAL
  Filled 2013-06-14: qty 2

## 2013-06-14 MED ORDER — METHYLPREDNISOLONE SODIUM SUCC 125 MG IJ SOLR
60.0000 mg | Freq: Four times a day (QID) | INTRAMUSCULAR | Status: DC
Start: 2013-06-14 — End: 2013-06-16
  Administered 2013-06-14 – 2013-06-16 (×7): 60 mg via INTRAVENOUS
  Filled 2013-06-14 (×11): qty 0.96
  Filled 2013-06-14: qty 2

## 2013-06-14 MED ORDER — RISAQUAD PO CAPS
1.0000 | ORAL_CAPSULE | Freq: Every day | ORAL | Status: DC
  Administered 2013-06-15 – 2013-06-17 (×3): 1 via ORAL
  Filled 2013-06-14 (×3): qty 1

## 2013-06-14 MED ORDER — APIXABAN 5 MG PO TABS
5.0000 mg | ORAL_TABLET | Freq: Two times a day (BID) | ORAL | Status: DC
  Administered 2013-06-14 – 2013-06-17 (×6): 5 mg via ORAL
  Filled 2013-06-14 (×7): qty 1

## 2013-06-14 MED ORDER — PIPERACILLIN-TAZOBACTAM 3.375 G IVPB
3.3750 g | Freq: Once | INTRAVENOUS | Status: AC
  Administered 2013-06-14: 3.375 g via INTRAVENOUS
  Filled 2013-06-14: qty 50

## 2013-06-14 NOTE — Progress Notes (Addendum)
ANTIBIOTIC CONSULT NOTE - INITIAL  Pharmacy Consult:  Vancomycin / Zosyn Indication:  Aspiration PNA  Allergies  Allergen Reactions  . Ace Inhibitors     SEVERE ASTHMA (COPD)  . Codeine Nausea And Vomiting  . Lasix [Furosemide] Itching    Patient Measurements: Height: 5\' 2"  (157.5 cm) Weight: 155 lb (70.308 kg) IBW/kg (Calculated) : 54.6  Vital Signs: Temp: 101.2 F (38.4 C) (03/07 1117) Temp src: Oral (03/07 1117) BP: 141/53 mmHg (03/07 1117) Pulse Rate: 98 (03/07 1117)  Labs: No results found for this basename: WBC, HGB, PLT, LABCREA, CREATININE,  in the last 72 hours Estimated Creatinine Clearance: 50.8 ml/min (by C-G formula based on Cr of 1). No results found for this basename: VANCOTROUGH, VANCOPEAK, VANCORANDOM, GENTTROUGH, GENTPEAK, GENTRANDOM, TOBRATROUGH, TOBRAPEAK, TOBRARND, AMIKACINPEAK, AMIKACINTROU, AMIKACIN,  in the last 72 hours   Microbiology: No results found for this or any previous visit (from the past 720 hour(s)).  Medical History: Past Medical History  Diagnosis Date  . CORONARY HEART DISEASE     a. s/p CABG;  b.  cath 06/27/10: S-Dx occluded, S-PDA 80-90% (tx with PCI); S-OM ok, L-LAD ok;  EF 65-70%  c.  s/p Promus DES to S-PDA 06/2010;   d. Myoview 8/12: low risk  . FIBRILLATION, ATRIAL     post op; ?documented during hosp. 06/2010  . SLEEP APNEA 09/2001    NPSG AHI 22/HR  . ALLERGIC RHINITIS   . ASTHMA   . Depression   . GERD     with HH, hx esophageal stricture  . DYSLIPIDEMIA   . HYPERTENSION     Echo 3/12: EF 55-60%; mod LVH; mild AS/AI; LAE; PASP 38; mild pulmo HTN  . Personal history of alcoholism   . Diverticulosis   . Benign liver cyst   . Skin cancer     L forearm  . Cataract     surgery to both eyes  . Esophageal stricture   . Hiatal hernia   . Pneumonia 04/2013 hosp      Assessment: [name] admitted with N/V/D and weakness.  Pharmacy consulted to dose vancomycin and Zosyn for aspiration PNA.  Noted patient was treated for  PNA two months ago.  Labs in process but SCr was 1 during last admission (~CrCL 40-60 ml/min).  Vanc 3/7 >> Zosyn 3/7 >>   Goal of Therapy:  Vancomycin trough level 15-20 mcg/ml   Plan:  - Vanc 1gm and Zosyn 3.375gm IV x 1 - F/U labs for further dosing    [name] D. Mina Marble, PharmD, BCPS Pager:  [phone] 06/14/2013, 12:06 PM   =============================  Addendum: - stable renal fxn, SCr 0.88, CrCL 58 ml/min   Plan: - Vanc 1250mg  IV Q24H - Zosyn 3.375gm IV Q8H, 4 hr infusion - Monitor renal fxn, clinical course, vanc trough at Css     [name] D. Mina Marble, PharmD, BCPS Pager:  [phone] 06/14/2013, 1:20 PM

## 2013-06-14 NOTE — ED Notes (Signed)
Dr. Wentz at bedside. 

## 2013-06-14 NOTE — ED Notes (Signed)
Dr. Vann at bedside 

## 2013-06-14 NOTE — ED Notes (Signed)
Pt from home, c/o fever, n/v/d and weakness starting this AM. Per EMS, pt had increase sob w/ exertion.

## 2013-06-14 NOTE — ED Provider Notes (Signed)
CSN: [id number]     Arrival date & time 06/14/13  1106 History   First MD Initiated Contact with Patient 06/14/13 1119     Chief Complaint  Patient presents with  . Fever  . Emesis     (Consider location/radiation/quality/duration/timing/severity/associated sxs/prior Treatment) Patient is a 80 y.o. male presenting with fever and vomiting. The history is provided by the patient.  Fever Associated symptoms: vomiting   Emesis  He has been ill cough, vomiting, fever since last night. He had pneumonia 2 months ago. He did not take his morning medications, eat or use anything for his fever. Complains of wheezing and shortness of breath. He feels like his emphysema is bothering him today. He was well yesterday, before going to bed. He denies focal weakness, chest pain, abdominal pain, back pain, dizziness, diarrhea, or dysuria. There are no known modifying factors.  Past Medical History  Diagnosis Date  . CORONARY HEART DISEASE     a. s/p CABG;  b.  cath 06/27/10: S-Dx occluded, S-PDA 80-90% (tx with PCI); S-OM ok, L-LAD ok;  EF 65-70%  c.  s/p Promus DES to S-PDA 06/2010;   d. Myoview 8/12: low risk  . FIBRILLATION, ATRIAL     post op; ?documented during hosp. 06/2010  . SLEEP APNEA 09/2001    NPSG AHI 22/HR  . ALLERGIC RHINITIS   . ASTHMA   . Depression   . GERD     with HH, hx esophageal stricture  . DYSLIPIDEMIA   . HYPERTENSION     Echo 3/12: EF 55-60%; mod LVH; mild AS/AI; LAE; PASP 38; mild pulmo HTN  . Personal history of alcoholism   . Diverticulosis   . Benign liver cyst   . Skin cancer     L forearm  . Cataract     surgery to both eyes  . Esophageal stricture   . Hiatal hernia   . Pneumonia 04/2013 hosp   Past Surgical History  Procedure Laterality Date  . Coronary artery bypass graft  02/2007  . Hernia repair  12/03/07  . Tonsillectomy    . Coronary angioplasty with stent placement  07/2010  . Cataract extraction, bilateral  2007  . Hernia repair  unsure ?60's  .  Cholecystectomy  02/21/2011    Procedure: LAPAROSCOPIC CHOLECYSTECTOMY WITH INTRAOPERATIVE CHOLANGIOGRAM;  Surgeon: Pedro Earls, MD;  Location: [facility];  Service: General;  Laterality: N/A;   Family History  Problem Relation Age of Onset  . COPD Sister   . Asthma Sister   . Emphysema Sister   . Hypertension Sister   . Hypertension Mother   . Colon cancer Neg Hx   . Heart disease Brother   . Heart disease Mother   . Heart disease Father    History  Substance Use Topics  . Smoking status: Former Smoker -- 3.00 packs/day for 40 years    Types: Cigarettes    Quit date: 04/11/1979  . Smokeless tobacco: Never Used  . Alcohol Use: No    Review of Systems  Constitutional: Positive for fever.  Gastrointestinal: Positive for vomiting.  All other systems reviewed and are negative.      Allergies  Ace inhibitors; Codeine; and Lasix  Home Medications   Current Outpatient Rx  Name  Route  Sig  Dispense  Refill  . acidophilus (RISAQUAD) CAPS   Oral   Take 1 capsule by mouth daily. Phillips probiotics         . albuterol (PROVENTIL HFA;VENTOLIN HFA) 108 (  90 BASE) MCG/ACT inhaler   Inhalation   Inhale into the lungs every 6 (six) hours as needed for wheezing or shortness of breath.         Marland Kitchen apixaban (ELIQUIS) 5 MG TABS tablet   Oral   Take 5 mg by mouth 2 (two) times daily.         . bisoprolol (ZEBETA) 10 MG tablet   Oral   Take 1 tablet (10 mg total) by mouth daily.   30 tablet   5   . budesonide (PULMICORT) 0.25 MG/2ML nebulizer solution   Nebulization   Take 0.25 mg by nebulization 2 (two) times daily as needed. For asthma         . calcium-vitamin D (OSCAL WITH D) 500-200 MG-UNIT per tablet   Oral   Take 1 tablet by mouth 2 (two) times daily.         . cloNIDine (CATAPRES) 0.3 MG tablet   Oral   Take 1 tablet (0.3 mg total) by mouth 2 (two) times daily.   60 tablet   11   . denosumab (PROLIA) 60 MG/ML SOLN injection   Subcutaneous   Inject  60 mg into the skin every 6 (six) months. Administer in upper arm, thigh, or abdomen   1 mL   5   . diltiazem (CARDIZEM CD) 120 MG 24 hr capsule   Oral   Take 1 capsule (120 mg total) by mouth 2 (two) times daily.   60 capsule   11   . famotidine (PEPCID) 20 MG tablet   Oral   Take 20 mg by mouth daily.          . ferrous sulfate 325 (65 FE) MG tablet   Oral   Take 1 tablet (325 mg total) by mouth 3 (three) times daily with meals.   90 tablet   3   . ipratropium-albuterol (DUONEB) 0.5-2.5 (3) MG/3ML SOLN   Nebulization   Take 3 mLs by nebulization every 6 (six) hours as needed (for shortness of breath).         . latanoprost (XALATAN) 0.005 % ophthalmic solution   Both Eyes   Place 1 drop into both eyes at bedtime.          Marland Kitchen loperamide (IMODIUM) 2 MG capsule   Oral   Take 2 mg by mouth daily as needed for diarrhea or loose stools.         Marland Kitchen LORazepam (ATIVAN) 0.5 MG tablet   Oral   Take 0.5 mg by mouth 2 (two) times daily.         Marland Kitchen losartan (COZAAR) 100 MG tablet   Oral   Take 1 tablet (100 mg total) by mouth daily.   30 tablet   1   . mometasone-formoterol (DULERA) 100-5 MCG/ACT AERO   Inhalation   Inhale 2 puffs into the lungs 2 (two) times daily.         . montelukast (SINGULAIR) 10 MG tablet   Oral   Take 10 mg by mouth at bedtime.         Marland Kitchen omeprazole (PRILOSEC) 20 MG capsule   Oral   Take 1 capsule (20 mg total) by mouth daily.   60 capsule   6   . potassium chloride SA (K-DUR,KLOR-CON) 20 MEQ tablet   Oral   Take 20 mEq by mouth 2 (two) times daily.         . pravastatin (PRAVACHOL) 80 MG tablet  Oral   Take 80 mg by mouth daily.         . promethazine (PHENERGAN) 25 MG tablet   Oral   Take 1 tablet (25 mg total) by mouth every 6 (six) hours as needed for nausea or vomiting.   30 tablet   0   . torsemide (DEMADEX) 20 MG tablet   Oral   Take 1 tablet (20 mg total) by mouth daily.   30 tablet   1   . vitamin B-12  1000 MCG tablet   Oral   Take 1 tablet (1,000 mcg total) by mouth daily.   30 tablet   1   . nitroGLYCERIN (NITROSTAT) 0.4 MG SL tablet   Sublingual   Place 0.4 mg under the tongue every 5 (five) minutes as needed for chest pain.          BP 141/60  Pulse 97  Temp(Src) 99.9 F (37.7 C) (Oral)  Resp 19  Ht 5\' 2"  (1.575 m)  Wt 155 lb (70.308 kg)  BMI 28.34 kg/m2  SpO2 96% Physical Exam  Nursing note and vitals reviewed. Constitutional: He is oriented to person, place, and time. He appears well-developed.  Elderly frail  HENT:  Head: Normocephalic and atraumatic.  Right Ear: External ear normal.  Left Ear: External ear normal.  Eyes: Conjunctivae and EOM are normal. Pupils are equal, round, and reactive to light.  Neck: Normal range of motion and phonation normal. Neck supple.  Cardiovascular: Normal rate, regular rhythm, normal heart sounds and intact distal pulses.   Pulmonary/Chest: Effort normal and breath sounds normal. He exhibits no bony tenderness.  Tachypnea with audible external wheezes. Decreased airflow bilaterally with mild rales right base. No auscultatory wheezing, rhonchi, or pleural rub  Abdominal: Soft. Normal appearance. He exhibits no distension. There is no tenderness.  Musculoskeletal: Normal range of motion. He exhibits no edema and no tenderness.  Neurological: He is alert and oriented to person, place, and time. No cranial nerve deficit or sensory deficit. He exhibits normal muscle tone. Coordination normal.  Skin: Skin is warm, dry and intact.  Psychiatric: He has a normal mood and affect. His behavior is normal.    ED Course  Procedures (including critical care time)  Medications  acetaminophen (TYLENOL) tablet 650 mg (650 mg Oral Given 06/14/13 1127)  0.9 %  sodium chloride infusion ( Intravenous Transfusing/Transfer 06/14/13 1529)  piperacillin-tazobactam (ZOSYN) IVPB 3.375 g (3.375 g Intravenous Transfusing/Transfer 06/14/13 1529)   piperacillin-tazobactam (ZOSYN) IVPB 3.375 g (not administered)  vancomycin (VANCOCIN) 1,250 mg in sodium chloride 0.9 % 250 mL IVPB (not administered)  sodium chloride 0.9 % bolus 500 mL (0 mLs Intravenous Stopped 06/14/13 1300)  vancomycin (VANCOCIN) IVPB 1000 mg/200 mL premix (0 mg Intravenous Stopped 06/14/13 1347)    Patient Vitals for the past 24 hrs:  BP Temp Temp src Pulse Resp SpO2 Height Weight  06/14/13 1515 141/60 mmHg - - 97 19 96 % - -  06/14/13 1500 135/54 mmHg - - 98 20 91 % - -  06/14/13 1445 131/56 mmHg - - 96 25 94 % - -  06/14/13 1430 132/52 mmHg - - 95 25 94 % - -  06/14/13 1230 136/59 mmHg - - 97 26 95 % - -  06/14/13 1228 - 99.9 F (37.7 C) Oral - - - - -  06/14/13 1215 116/75 mmHg - - 96 24 95 % - -  06/14/13 1200 128/48 mmHg - - 97 21 95 % - -  06/14/13 1117 141/53 mmHg 101.2 F (38.4 C) Oral 98 21 94 % 5\' 2"  (1.575 m) 155 lb (70.308 kg)  06/14/13 1114 - - - - - 98 % - -       Medications  acetaminophen (TYLENOL) tablet 650 mg (650 mg Oral Given 06/14/13 1127)  0.9 %  sodium chloride infusion ( Intravenous Transfusing/Transfer 06/14/13 1529)  piperacillin-tazobactam (ZOSYN) IVPB 3.375 g (3.375 g Intravenous Transfusing/Transfer 06/14/13 1529)  piperacillin-tazobactam (ZOSYN) IVPB 3.375 g (not administered)  vancomycin (VANCOCIN) 1,250 mg in sodium chloride 0.9 % 250 mL IVPB (not administered)  sodium chloride 0.9 % bolus 500 mL (0 mLs Intravenous Stopped 06/14/13 1300)  vancomycin (VANCOCIN) IVPB 1000 mg/200 mL premix (0 mg Intravenous Stopped 06/14/13 1347)    Patient Vitals for the past 24 hrs:  BP Temp Temp src Pulse Resp SpO2 Height Weight  06/14/13 1515 141/60 mmHg - - 97 19 96 % - -  06/14/13 1500 135/54 mmHg - - 98 20 91 % - -  06/14/13 1445 131/56 mmHg - - 96 25 94 % - -  06/14/13 1430 132/52 mmHg - - 95 25 94 % - -  06/14/13 1230 136/59 mmHg - - 97 26 95 % - -  06/14/13 1228 - 99.9 F (37.7 C) Oral - - - - -  06/14/13 1215 116/75 mmHg - - 96 24 95 % -  -  06/14/13 1200 128/48 mmHg - - 97 21 95 % - -  06/14/13 1117 141/53 mmHg 101.2 F (38.4 C) Oral 98 21 94 % 5\' 2"  (1.575 m) 155 lb (70.308 kg)  06/14/13 1114 - - - - - 98 % - -    2:32 PM Reevaluation with update and discussion. After initial assessment and treatment, an updated evaluation reveals physical examination essentially unchanged. Respiratory rate improved somewhat. Oxygen saturation still normal at 94% on room air. Tytionna Cloyd L     Medications  acetaminophen (TYLENOL) tablet 650 mg (650 mg Oral Given 06/14/13 1127)  0.9 %  sodium chloride infusion ( Intravenous Transfusing/Transfer 06/14/13 1529)  piperacillin-tazobactam (ZOSYN) IVPB 3.375 g (3.375 g Intravenous Transfusing/Transfer 06/14/13 1529)  piperacillin-tazobactam (ZOSYN) IVPB 3.375 g (not administered)  vancomycin (VANCOCIN) 1,250 mg in sodium chloride 0.9 % 250 mL IVPB (not administered)  sodium chloride 0.9 % bolus 500 mL (0 mLs Intravenous Stopped 06/14/13 1300)  vancomycin (VANCOCIN) IVPB 1000 mg/200 mL premix (0 mg Intravenous Stopped 06/14/13 1347)    Patient Vitals for the past 24 hrs:  BP Temp Temp src Pulse Resp SpO2 Height Weight  06/14/13 1515 141/60 mmHg - - 97 19 96 % - -  06/14/13 1500 135/54 mmHg - - 98 20 91 % - -  06/14/13 1445 131/56 mmHg - - 96 25 94 % - -  06/14/13 1430 132/52 mmHg - - 95 25 94 % - -  06/14/13 1230 136/59 mmHg - - 97 26 95 % - -  06/14/13 1228 - 99.9 F (37.7 C) Oral - - - - -  06/14/13 1215 116/75 mmHg - - 96 24 95 % - -  06/14/13 1200 128/48 mmHg - - 97 21 95 % - -  06/14/13 1117 141/53 mmHg 101.2 F (38.4 C) Oral 98 21 94 % 5\' 2"  (1.575 m) 155 lb (70.308 kg)  06/14/13 1114 - - - - - 98 % - -      2:29 PM-Consult complete with Dr. Benjamine Mola. Patient case explained and discussed. She agrees to admit patient for further evaluation  and treatment. Call ended at [place] Reviewed  CBC WITH DIFFERENTIAL - Abnormal; Notable for the following:    WBC 16.3 (*)    RDW  16.0 (*)    Neutrophils Relative % 87 (*)    Neutro Abs 14.2 (*)    Lymphocytes Relative 5 (*)    Monocytes Absolute 1.3 (*)    All other components within normal limits  COMPREHENSIVE METABOLIC PANEL - Abnormal; Notable for the following:    GFR calc non Af Amer 79 (*)    All other components within normal limits  CULTURE, BLOOD (ROUTINE X 2)  CULTURE, BLOOD (ROUTINE X 2)  URINE CULTURE  URINALYSIS, ROUTINE W REFLEX MICROSCOPIC  INFLUENZA PANEL BY PCR (TYPE A & B, H1N1)  I-STAT CG4 LACTIC ACID, ED   Imaging Review Dg Chest 2 View  06/14/2013   CLINICAL DATA:  Shortness of breath, cough.  EXAM: CHEST  2 VIEW  COMPARISON:  05/04/2013  FINDINGS: Interval resolution of the previously seen left lung consolidation. New area of right perihilar and infrahilar opacity/ infiltrate concerning for pneumonia. No effusions. Cardiomegaly. Prior CABG. No acute bony abnormality.  IMPRESSION: Findings concerning for right perihilar and infrahilar pneumonia.  Resolution of the previously seen left pneumonia.   Electronically Signed   By: Rolm Baptise M.D.   On: 06/14/2013 13:51     EKG Interpretation   Date/Time:  Saturday June 14 2013 11:56:07 EST Ventricular Rate:  96 PR Interval:  206 QRS Duration: 102 QT Interval:  367 QTC Calculation: 464 R Axis:   58 Text Interpretation:  Atrial-paced complexes RSR' in V1 or V2, probably  normal variant Probable anteroseptal infarct, old since last tracing no  significant change Confirmed by Eulis Foster  MD, Amiah Frohlich [phone]) on 06/14/2013  3:41:45 PM      MDM   Final diagnoses:  HCAP (healthcare-associated pneumonia)    Evaluation is consistent with HCAP. He is hemodynamically stable. There is no evidence for sepsis.  He is at moderate risk for worsening therefore, needs to be admitted. He can be admitted to a larger bed for further treatment, and evaluation.  Nursing Notes Reviewed/ Care Coordinated, and agree without changes. Applicable Imaging Reviewed.   Interpretation of Laboratory Data incorporated into ED treatment  Plan: Admit  Richarda Blade, MD 06/14/13 1546

## 2013-06-14 NOTE — H&P (Addendum)
[facility] History and Physical  [name]. TDD:[id number] DOB: 1930/06/11 DOA: 06/14/2013  Referring physician: er PCP: Gwendolyn Grant, MD   Chief Complaint: fever  HPI: [name]. is a 80 y.o. male  Who was in the hospital 2 months ago with PNA and CHF.  Has been doing well outpatient until last night when he developed fever, cough which caused him to vomit.  He endorses SOB and cough, wheezing.   Denies trouble swallowing or coughing with eating on a normal basis.  In ER, xray showed resolution of previous PNA on left and new right sided PNA.  His WBC count was elevated as well.  He does not endorse any weight gain.    -asking for food  Review of Systems:  All systems reviewed, negative unless stated above   Past Medical History  Diagnosis Date  . CORONARY HEART DISEASE     a. s/p CABG;  b.  cath 06/27/10: S-Dx occluded, S-PDA 80-90% (tx with PCI); S-OM ok, L-LAD ok;  EF 65-70%  c.  s/p Promus DES to S-PDA 06/2010;   d. Myoview 8/12: low risk  . FIBRILLATION, ATRIAL     post op; ?documented during hosp. 06/2010  . SLEEP APNEA 09/2001    NPSG AHI 22/HR  . ALLERGIC RHINITIS   . ASTHMA   . Depression   . GERD     with HH, hx esophageal stricture  . DYSLIPIDEMIA   . HYPERTENSION     Echo 3/12: EF 55-60%; mod LVH; mild AS/AI; LAE; PASP 38; mild pulmo HTN  . Personal history of alcoholism   . Diverticulosis   . Benign liver cyst   . Skin cancer     L forearm  . Cataract     surgery to both eyes  . Esophageal stricture   . Hiatal hernia   . Pneumonia 04/2013 hosp   Past Surgical History  Procedure Laterality Date  . Coronary artery bypass graft  02/2007  . Hernia repair  12/03/07  . Tonsillectomy    . Coronary angioplasty with stent placement  07/2010  . Cataract extraction, bilateral  2007  . Hernia repair  unsure ?60's  . Cholecystectomy  02/21/2011    Procedure: LAPAROSCOPIC CHOLECYSTECTOMY WITH INTRAOPERATIVE CHOLANGIOGRAM;  Surgeon: Pedro Earls, MD;  Location: [facility];  Service: General;  Laterality: N/A;   Social History:  reports that he quit smoking about 34 years ago. His smoking use included Cigarettes. He has a 120 pack-year smoking history. He has never used smokeless tobacco. He reports that he does not drink alcohol or use illicit drugs.  Allergies  Allergen Reactions  . Ace Inhibitors     SEVERE ASTHMA (COPD)  . Codeine Nausea And Vomiting  . Lasix [Furosemide] Itching    Family History  Problem Relation Age of Onset  . COPD Sister   . Asthma Sister   . Emphysema Sister   . Hypertension Sister   . Hypertension Mother   . Colon cancer Neg Hx   . Heart disease Brother   . Heart disease Mother   . Heart disease Father      Prior to Admission medications   Medication Sig Start Date End Date Taking? Authorizing Provider  acidophilus (RISAQUAD) CAPS Take 1 capsule by mouth daily. Phillips probiotics   Yes Historical Provider, MD  albuterol (PROVENTIL HFA;VENTOLIN HFA) 108 (90 BASE) MCG/ACT inhaler Inhale into the lungs every 6 (six) hours as needed for wheezing or shortness of  breath.   Yes Historical Provider, MD  apixaban (ELIQUIS) 5 MG TABS tablet Take 5 mg by mouth 2 (two) times daily.   Yes Historical Provider, MD  bisoprolol (ZEBETA) 10 MG tablet Take 1 tablet (10 mg total) by mouth daily. 03/19/13  Yes Rowe Clack, MD  budesonide (PULMICORT) 0.25 MG/2ML nebulizer solution Take 0.25 mg by nebulization 2 (two) times daily as needed. For asthma   Yes Historical Provider, MD  calcium-vitamin D (OSCAL WITH D) 500-200 MG-UNIT per tablet Take 1 tablet by mouth 2 (two) times daily.   Yes Historical Provider, MD  cloNIDine (CATAPRES) 0.3 MG tablet Take 1 tablet (0.3 mg total) by mouth 2 (two) times daily. 04/17/13  Yes Minus Breeding, MD  denosumab (PROLIA) 60 MG/ML SOLN injection Inject 60 mg into the skin every 6 (six) months. Administer in upper arm, thigh, or abdomen 06/12/12  Yes Rowe Clack, MD    diltiazem (CARDIZEM CD) 120 MG 24 hr capsule Take 1 capsule (120 mg total) by mouth 2 (two) times daily. 06/06/13  Yes Minus Breeding, MD  famotidine (PEPCID) 20 MG tablet Take 20 mg by mouth daily.  05/04/13  Yes Historical Provider, MD  ferrous sulfate 325 (65 FE) MG tablet Take 1 tablet (325 mg total) by mouth 3 (three) times daily with meals. 04/24/13  Yes Minus Breeding, MD  ipratropium-albuterol (DUONEB) 0.5-2.5 (3) MG/3ML SOLN Take 3 mLs by nebulization every 6 (six) hours as needed (for shortness of breath).   Yes Historical Provider, MD  latanoprost (XALATAN) 0.005 % ophthalmic solution Place 1 drop into both eyes at bedtime.    Yes Historical Provider, MD  loperamide (IMODIUM) 2 MG capsule Take 2 mg by mouth daily as needed for diarrhea or loose stools.   Yes Historical Provider, MD  LORazepam (ATIVAN) 0.5 MG tablet Take 0.5 mg by mouth 2 (two) times daily.   Yes Historical Provider, MD  losartan (COZAAR) 100 MG tablet Take 1 tablet (100 mg total) by mouth daily. 06/09/13  Yes Minus Breeding, MD  mometasone-formoterol (DULERA) 100-5 MCG/ACT AERO Inhale 2 puffs into the lungs 2 (two) times daily.   Yes Historical Provider, MD  montelukast (SINGULAIR) 10 MG tablet Take 10 mg by mouth at bedtime.   Yes Historical Provider, MD  omeprazole (PRILOSEC) 20 MG capsule Take 1 capsule (20 mg total) by mouth daily. 04/29/13  Yes Irene Shipper, MD  potassium chloride SA (K-DUR,KLOR-CON) 20 MEQ tablet Take 20 mEq by mouth 2 (two) times daily.   Yes Historical Provider, MD  pravastatin (PRAVACHOL) 80 MG tablet Take 80 mg by mouth daily.   Yes Historical Provider, MD  promethazine (PHENERGAN) 25 MG tablet Take 1 tablet (25 mg total) by mouth every 6 (six) hours as needed for nausea or vomiting. 03/12/13  Yes Rowe Clack, MD  torsemide (DEMADEX) 20 MG tablet Take 1 tablet (20 mg total) by mouth daily. 05/07/13  Yes Zerita Boers, MD  vitamin B-12 1000 MCG tablet Take 1 tablet (1,000 mcg total) by  mouth daily. 05/07/13  Yes Zerita Boers, MD  nitroGLYCERIN (NITROSTAT) 0.4 MG SL tablet Place 0.4 mg under the tongue every 5 (five) minutes as needed for chest pain.    Historical Provider, MD   Physical Exam: Filed Vitals:   06/14/13 1230  BP: 136/59  Pulse: 97  Temp:   Resp: 26    BP 136/59  Pulse 97  Temp(Src) 99.9 F (37.7 C) (Oral)  Resp 26  Ht 5\' 2"  (1.575 m)  Wt 70.308 kg (155 lb)  BMI 28.34 kg/m2  SpO2 95%  General:  Appears calm and comfortable Eyes: PERRL, normal lids, irises & conjunctiva ENT: grossly normal hearing, lips & tongue Neck: no LAD, masses or thyromegaly Cardiovascular: RRR, no m/r/g. No LE edema. Respiratory: Coarse breath sounds, wheezing b/l Abdomen: soft, ntnd Skin: no rash or induration seen on limited exam Musculoskeletal: grossly normal tone BUE/BLE Psychiatric: grossly normal mood and affect, speech fluent and appropriate Neurologic: grossly non-focal.          Labs on Admission:  Basic Metabolic Panel:  Recent Labs Lab 06/14/13 1130  NA 139  K 4.0  CL 102  CO2 24  GLUCOSE 97  BUN 12  CREATININE 0.88  CALCIUM 9.0   Liver Function Tests:  Recent Labs Lab 06/14/13 1130  AST 20  ALT 20  ALKPHOS 73  BILITOT 0.6  PROT 6.5  ALBUMIN 3.8   No results found for this basename: LIPASE, AMYLASE,  in the last 168 hours No results found for this basename: AMMONIA,  in the last 168 hours CBC:  Recent Labs Lab 06/14/13 1130  WBC 16.3*  NEUTROABS 14.2*  HGB 13.8  HCT 39.8  MCV 88.2  PLT 180   Cardiac Enzymes: No results found for this basename: CKTOTAL, CKMB, CKMBINDEX, TROPONINI,  in the last 168 hours  BNP (last 3 results)  Recent Labs  03/08/13 2023 03/14/13 1047 05/04/13 1008  PROBNP 236.4 755.0* 1749.0*   CBG: No results found for this basename: GLUCAP,  in the last 168 hours  Radiological Exams on Admission: Dg Chest 2 View  06/14/2013   CLINICAL DATA:  Shortness of breath, cough.  EXAM: CHEST   2 VIEW  COMPARISON:  05/04/2013  FINDINGS: Interval resolution of the previously seen left lung consolidation. New area of right perihilar and infrahilar opacity/ infiltrate concerning for pneumonia. No effusions. Cardiomegaly. Prior CABG. No acute bony abnormality.  IMPRESSION: Findings concerning for right perihilar and infrahilar pneumonia.  Resolution of the previously seen left pneumonia.   Electronically Signed   By: Rolm Baptise M.D.   On: 06/14/2013 13:51    EKG: Independently reviewed. Atrial paced  Assessment/Plan Active Problems:   HYPERTENSION   Chronic diastolic heart failure   HCAP (healthcare-associated pneumonia)   PAF (paroxysmal atrial fibrillation)   Fever   Vomiting   HCAPNA- IV abx per pharmacy; nebs, steroids, SLP to rule out aspiration; blood cultures x 2, recent PNA has resolved  PAF- on eliquis, rate controlled  HTN  chronic diastolic failure  Fever- related to PNA  Leukocytosis- due to PNA     Code Status: full Family Communication: wife at bedside Disposition Plan:   Time spent: 8 min  Eulogio Bear [facility] Pager [phone]

## 2013-06-14 NOTE — Progress Notes (Signed)
Patient transferred to 3E10 from ED. Transported via stretcher. Wife at bedside. Patient is alert and oriented x3. Vital signs are WNL. Patient placed on telemetry. Oriented patient to room and call bell. Bed is in low position.

## 2013-06-15 DIAGNOSIS — I4891 Unspecified atrial fibrillation: Secondary | ICD-10-CM

## 2013-06-15 LAB — URINE CULTURE

## 2013-06-15 LAB — LEGIONELLA ANTIGEN, URINE: Legionella Antigen, Urine: NEGATIVE

## 2013-06-15 LAB — HIV ANTIBODY (ROUTINE TESTING W REFLEX): HIV: NONREACTIVE

## 2013-06-15 MED ORDER — DEXTROSE 5 % IV SOLN
1.0000 g | Freq: Two times a day (BID) | INTRAVENOUS | Status: DC
  Administered 2013-06-15 (×2): 1 g via INTRAVENOUS
  Filled 2013-06-15 (×4): qty 1

## 2013-06-15 MED ORDER — TORSEMIDE 20 MG PO TABS
40.0000 mg | ORAL_TABLET | Freq: Two times a day (BID) | ORAL | Status: DC
  Administered 2013-06-15: 40 mg via ORAL
  Filled 2013-06-15 (×4): qty 2

## 2013-06-15 MED ORDER — ATORVASTATIN CALCIUM 20 MG PO TABS
20.0000 mg | ORAL_TABLET | Freq: Every day | ORAL | Status: DC
Start: 2013-06-15 — End: 2013-06-17
  Administered 2013-06-15 – 2013-06-16 (×2): 20 mg via ORAL
  Filled 2013-06-15 (×3): qty 1

## 2013-06-15 NOTE — Progress Notes (Signed)
[facility] PROGRESS NOTE Interim History: 80 y.o. male Who was in the hospital 2 months ago with PNA and CHF. Has been doing well outpatient until last night when he developed fever, cough which caused him to vomit. He endorses SOB and cough, wheezing.  Denies trouble swallowing or coughing with eating on a normal basis  Assessment/Plan: Fever due to HCAP (healthcare-associated pneumonia) - afebrile. Started on vanc and cefepime 3.8.2015 - cultures pending. - SLP pending  Acute on chronic diastolic heart failure: - + JVD and lower extremity edema. - Daily weight on d/c 70 kg, on admission 68 kg. - Will increase torsemide. Fluid restrict, low sodium diet.  PAF (paroxysmal atrial fibrillation) - rate controlled, cont eliquis. - d/c telemetry  Chronic diastolic heart failure - Euvolemic cont torsemide.  HYPERTENSION - Bp controlled.    Code Status: full  Family Communication: wife at bedside  Disposition Plan: inpatient    Consultants:  *none  Procedures:  CXR  Antibiotics:  vanc and cefepime  HPI/Subjective: No complains  Objective: Filed Vitals:   06/14/13 2030 06/14/13 2116 06/15/13 0640 06/15/13 0702  BP:  120/60 131/58   Pulse:  95 88   Temp:  98.8 F (37.1 C) 97.4 F (36.3 C)   TempSrc:  Oral Oral   Resp:  19 19   Height:      Weight:    68.312 kg (150 lb 9.6 oz)  SpO2: 98% 98% 97%     Intake/Output Summary (Last 24 hours) at 06/15/13 0936 Last data filed at 06/15/13 0842  Gross per 24 hour  Intake    700 ml  Output   1275 ml  Net   -575 ml   Filed Weights   06/14/13 1117 06/14/13 1606 06/15/13 0702  Weight: 70.308 kg (155 lb) 70.5 kg (155 lb 6.8 oz) 68.312 kg (150 lb 9.6 oz)    Exam:  General: Alert, awake, oriented x3, in no acute distress.  HEENT: No bruits, no goiter. +JVD Heart: Regular rate and rhythm, lower extremity edema Lungs: Good air movement, crackles at right lung base. Wheezing B/L Abdomen: Soft,  nontender, nondistended, positive bowel sounds.     Data Reviewed: Basic Metabolic Panel:  Recent Labs Lab 06/14/13 1130  NA 139  K 4.0  CL 102  CO2 24  GLUCOSE 97  BUN 12  CREATININE 0.88  CALCIUM 9.0   Liver Function Tests:  Recent Labs Lab 06/14/13 1130  AST 20  ALT 20  ALKPHOS 73  BILITOT 0.6  PROT 6.5  ALBUMIN 3.8   No results found for this basename: LIPASE, AMYLASE,  in the last 168 hours No results found for this basename: AMMONIA,  in the last 168 hours CBC:  Recent Labs Lab 06/14/13 1130  WBC 16.3*  NEUTROABS 14.2*  HGB 13.8  HCT 39.8  MCV 88.2  PLT 180   Cardiac Enzymes: No results found for this basename: CKTOTAL, CKMB, CKMBINDEX, TROPONINI,  in the last 168 hours BNP (last 3 results)  Recent Labs  03/08/13 2023 03/14/13 1047 05/04/13 1008  PROBNP 236.4 755.0* 1749.0*   CBG: No results found for this basename: GLUCAP,  in the last 168 hours  No results found for this or any previous visit (from the past 240 hour(s)).   Studies: Dg Chest 2 View  06/14/2013   CLINICAL DATA:  Shortness of breath, cough.  EXAM: CHEST  2 VIEW  COMPARISON:  05/04/2013  FINDINGS: Interval resolution of the previously seen left lung consolidation.  New area of right perihilar and infrahilar opacity/ infiltrate concerning for pneumonia. No effusions. Cardiomegaly. Prior CABG. No acute bony abnormality.  IMPRESSION: Findings concerning for right perihilar and infrahilar pneumonia.  Resolution of the previously seen left pneumonia.   Electronically Signed   By: Rolm Baptise M.D.   On: 06/14/2013 13:51    Scheduled Meds: . acidophilus  1 capsule Oral Daily  . apixaban  5 mg Oral BID  . bisoprolol  10 mg Oral Daily  . calcium-vitamin D  1 tablet Oral BID  . ceFEPime (MAXIPIME) IV  1 g Intravenous Q12H  . cloNIDine  0.3 mg Oral BID  . diltiazem  120 mg Oral BID  . famotidine  20 mg Oral Daily  . ferrous sulfate  325 mg Oral TID WC  . latanoprost  1 drop Both  Eyes QHS  . LORazepam  0.5 mg Oral BID  . losartan  100 mg Oral Daily  . methylPREDNISolone (SOLU-MEDROL) injection  60 mg Intravenous Q6H  . montelukast  10 mg Oral QHS  . pantoprazole  40 mg Oral Daily  . potassium chloride SA  20 mEq Oral BID  . simvastatin  40 mg Oral q1800  . torsemide  20 mg Oral Daily  . vancomycin  1,250 mg Intravenous Q24H  . vitamin B-12  1,000 mcg Oral Daily   Continuous Infusions:    Charlynne Cousins  [facility] Pager [phone]. If 8PM-8AM, please contact night-coverage at [email or web address], password [facility] 06/15/2013, 9:36 AM  LOS: 1 day

## 2013-06-15 NOTE — Progress Notes (Signed)
Patient alert and oriented x4 throughout shift.  Vital signs stable.  Wife and daughter at bedside throughout afternoon.  Heart failure video viewed by patient and family and heart failure book introduced; all deny any questions after viewing.  Able to wean patient to room air with oxygen saturations maintaining at 98% on room air at rest.  Patient and family deny any questions or concerns at this time.  Will continue to monitor.

## 2013-06-16 DIAGNOSIS — I5032 Chronic diastolic (congestive) heart failure: Secondary | ICD-10-CM

## 2013-06-16 DIAGNOSIS — I2510 Atherosclerotic heart disease of native coronary artery without angina pectoris: Secondary | ICD-10-CM

## 2013-06-16 LAB — BASIC METABOLIC PANEL
BUN: 30 mg/dL — AB (ref 6–23)
CO2: 27 meq/L (ref 19–32)
CREATININE: 1.03 mg/dL (ref 0.50–1.35)
Calcium: 8.9 mg/dL (ref 8.4–10.5)
Chloride: 103 mEq/L (ref 96–112)
GFR calc Af Amer: 77 mL/min — ABNORMAL LOW (ref >90)
GFR calc non Af Amer: 66 mL/min — ABNORMAL LOW (ref >90)
Glucose, Bld: 190 mg/dL — ABNORMAL HIGH (ref 70–99)
Potassium: 3.1 mEq/L — ABNORMAL LOW (ref 3.7–5.3)
Sodium: 144 mEq/L (ref 137–147)

## 2013-06-16 LAB — CLOSTRIDIUM DIFFICILE BY PCR: Toxigenic C. Difficile by PCR: NEGATIVE

## 2013-06-16 MED ORDER — POTASSIUM CHLORIDE CRYS ER 20 MEQ PO TBCR
40.0000 meq | EXTENDED_RELEASE_TABLET | Freq: Two times a day (BID) | ORAL | Status: AC
  Administered 2013-06-16 (×2): 40 meq via ORAL
  Filled 2013-06-16: qty 2

## 2013-06-16 MED ORDER — LEVOFLOXACIN 750 MG PO TABS
750.0000 mg | ORAL_TABLET | ORAL | Status: DC
  Administered 2013-06-16: 750 mg via ORAL
  Filled 2013-06-16: qty 1

## 2013-06-16 MED ORDER — TORSEMIDE 20 MG PO TABS
20.0000 mg | ORAL_TABLET | Freq: Every day | ORAL | Status: DC
  Filled 2013-06-16: qty 1

## 2013-06-16 MED ORDER — ISOSORB DINITRATE-HYDRALAZINE 20-37.5 MG PO TABS
1.0000 | ORAL_TABLET | Freq: Two times a day (BID) | ORAL | Status: DC
  Administered 2013-06-16 – 2013-06-17 (×3): 1 via ORAL
  Filled 2013-06-16 (×4): qty 1

## 2013-06-16 MED ORDER — SACCHAROMYCES BOULARDII 250 MG PO CAPS
250.0000 mg | ORAL_CAPSULE | Freq: Two times a day (BID) | ORAL | Status: DC
  Administered 2013-06-16 – 2013-06-17 (×3): 250 mg via ORAL
  Filled 2013-06-16 (×4): qty 1

## 2013-06-16 MED ORDER — LOPERAMIDE HCL 2 MG PO CAPS
2.0000 mg | ORAL_CAPSULE | ORAL | Status: DC | PRN
  Administered 2013-06-16: 2 mg via ORAL
  Filled 2013-06-16: qty 1

## 2013-06-16 MED ORDER — PREDNISONE 50 MG PO TABS
50.0000 mg | ORAL_TABLET | Freq: Every day | ORAL | Status: DC
  Administered 2013-06-17: 50 mg via ORAL
  Filled 2013-06-16 (×2): qty 1

## 2013-06-16 NOTE — Progress Notes (Signed)
UR completed Lavalle Skoda K. Kinzy Weyers, RN, BSN, MSHL, CCM  06/16/2013 11:16 AM

## 2013-06-16 NOTE — Care Management Note (Addendum)
  Page 1 of 1   06/17/2013     11:44:41 AM   CARE MANAGEMENT NOTE 06/17/2013  Patient:  [name], [name]   Account Number:  [id number]  Date Initiated:  06/16/2013  Documentation initiated by:  Mariann Laster  Subjective/Objective Assessment:   Admitted with Pneumonia     Action/Plan:   CM to follow for disposition needs   Anticipated DC Date:  06/19/2013   Anticipated DC Plan:  HOME/SELF CARE         Choice offered to / List presented to:             Status of service:  Completed, signed off Medicare Important Message given?   (If response is "NO", the following Medicare IM given date fields will be blank) Date Medicare IM given:   Date Additional Medicare IM given:    Discharge Disposition:  HOME/SELF CARE  Per UR Regulation:  Reviewed for med. necessity/level of care/duration of stay  If discussed at [place] of Stay Meetings, dates discussed:    Comments:  06/17/2013 Social:  From home Disposition:  Home Self care [place],  BSN, [place], CCM 06/17/2013 11:43am    06/16/2013 Social:  From home Disposition pending: Lyric Rossano RN,  BSN, [place], CCM 06/16/2013

## 2013-06-16 NOTE — Progress Notes (Signed)
[facility] PROGRESS NOTE Interim History: 80 y.o. male Who was in the hospital 2 months ago with PNA and CHF. Has been doing well outpatient until last night when he developed fever, cough which caused him to vomit. He endorses SOB and cough, wheezing.  Denies trouble swallowing or coughing with eating on a normal basis  Assessment/Plan: Fever due to HCAP (healthcare-associated pneumonia) - afebrile. Started on vanc and cefepime 3.8.2015, deescalte to oral levaquin on 3.9.2015. - cultures negative, afebrile. - SLP pending.  Acute on chronic diastolic heart failure: - - JVD Poor POU. - Daily weight on d/c 70 kg, on admission 68 kg. - Will increase torsemide. Fluid restrict, low sodium diet.  PAF (paroxysmal atrial fibrillation) - rate controlled, cont eliquis. - d/c telemetry  Chronic diastolic heart failure - Euvolemic cont torsemide. - mild increase in Cr.  HYPERTENSION - Bp controlled.    Code Status: full  Family Communication: wife at bedside  Disposition Plan: inpatient    Consultants:  *none  Procedures:  CXR  Antibiotics:  vanc and cefepime  HPI/Subjective: No complains  Objective: Filed Vitals:   06/15/13 1753 06/15/13 2156 06/16/13 0544 06/16/13 0658  BP: 133/51 143/57  144/49  Pulse: 72 68  64  Temp:    97.9 F (36.6 C)  TempSrc:    Oral  Resp: 20   18  Height:      Weight:   67.45 kg (148 lb 11.2 oz)   SpO2: 99%   95%    Intake/Output Summary (Last 24 hours) at 06/16/13 0940 Last data filed at 06/16/13 0900  Gross per 24 hour  Intake   1260 ml  Output   1225 ml  Net     35 ml   Filed Weights   06/14/13 1606 06/15/13 0702 06/16/13 0544  Weight: 70.5 kg (155 lb 6.8 oz) 68.312 kg (150 lb 9.6 oz) 67.45 kg (148 lb 11.2 oz)    Exam:  General: Alert, awake, oriented x3, in no acute distress.  HEENT: No bruits, no goiter. -JVD Heart: Regular rate and rhythm,  Lungs: Good air movement, crackles at right lung base. Wheezing  B/L Abdomen: Soft, nontender, nondistended, positive bowel sounds.     Data Reviewed: Basic Metabolic Panel:  Recent Labs Lab 06/14/13 1130 06/16/13 0540  NA 139 144  K 4.0 3.1*  CL 102 103  CO2 24 27  GLUCOSE 97 190*  BUN 12 30*  CREATININE 0.88 1.03  CALCIUM 9.0 8.9   Liver Function Tests:  Recent Labs Lab 06/14/13 1130  AST 20  ALT 20  ALKPHOS 73  BILITOT 0.6  PROT 6.5  ALBUMIN 3.8   No results found for this basename: LIPASE, AMYLASE,  in the last 168 hours No results found for this basename: AMMONIA,  in the last 168 hours CBC:  Recent Labs Lab 06/14/13 1130  WBC 16.3*  NEUTROABS 14.2*  HGB 13.8  HCT 39.8  MCV 88.2  PLT 180   Cardiac Enzymes: No results found for this basename: CKTOTAL, CKMB, CKMBINDEX, TROPONINI,  in the last 168 hours BNP (last 3 results)  Recent Labs  03/08/13 2023 03/14/13 1047 05/04/13 1008  PROBNP 236.4 755.0* 1749.0*   CBG: No results found for this basename: GLUCAP,  in the last 168 hours  Recent Results (from the past 240 hour(s))  CULTURE, BLOOD (ROUTINE X 2)     Status: None   Collection Time    06/14/13 11:30 AM      Result  Value Ref Range Status   Specimen Description BLOOD RIGHT FOREARM   Final   Special Requests BOTTLES DRAWN AEROBIC AND ANAEROBIC 10CC   Final   Culture  Setup Time     Final   Value: 06/14/2013 19:21     Performed at [facility]   Culture     Final   Value:        BLOOD CULTURE RECEIVED NO GROWTH TO DATE CULTURE WILL BE HELD FOR 5 DAYS BEFORE ISSUING A FINAL NEGATIVE REPORT     Performed at [facility]   Report Status PENDING   Incomplete  CULTURE, BLOOD (ROUTINE X 2)     Status: None   Collection Time    06/14/13 11:52 AM      Result Value Ref Range Status   Specimen Description BLOOD LEFT ANTECUBITAL   Final   Special Requests BOTTLES DRAWN AEROBIC AND ANAEROBIC 10CC   Final   Culture  Setup Time     Final   Value: 06/14/2013 19:21     Performed at [facility]   Culture     Final   Value:        BLOOD CULTURE RECEIVED NO GROWTH TO DATE CULTURE WILL BE HELD FOR 5 DAYS BEFORE ISSUING A FINAL NEGATIVE REPORT     Performed at [facility]   Report Status PENDING   Incomplete  URINE CULTURE     Status: None   Collection Time    06/14/13 12:24 PM      Result Value Ref Range Status   Specimen Description URINE, RANDOM   Final   Special Requests NONE   Final   Culture  Setup Time     Final   Value: 06/14/2013 19:35     Performed at [place]     Final   Value: 25,000 COLONIES/ML     Performed at [facility]   Culture     Final   Value: Multiple bacterial morphotypes present, none predominant. Suggest appropriate recollection if clinically indicated.     Performed at [facility]   Report Status 06/15/2013 FINAL   Final  CLOSTRIDIUM DIFFICILE BY PCR     Status: None   Collection Time    06/16/13  6:23 AM      Result Value Ref Range Status   C difficile by pcr NEGATIVE  NEGATIVE Final     Studies: Dg Chest 2 View  06/14/2013   CLINICAL DATA:  Shortness of breath, cough.  EXAM: CHEST  2 VIEW  COMPARISON:  05/04/2013  FINDINGS: Interval resolution of the previously seen left lung consolidation. New area of right perihilar and infrahilar opacity/ infiltrate concerning for pneumonia. No effusions. Cardiomegaly. Prior CABG. No acute bony abnormality.  IMPRESSION: Findings concerning for right perihilar and infrahilar pneumonia.  Resolution of the previously seen left pneumonia.   Electronically Signed   By: Rolm Baptise M.D.   On: 06/14/2013 13:51    Scheduled Meds: . acidophilus  1 capsule Oral Daily  . apixaban  5 mg Oral BID  . atorvastatin  20 mg Oral q1800  . bisoprolol  10 mg Oral Daily  . calcium-vitamin D  1 tablet Oral BID  . cloNIDine  0.3 mg Oral BID  . diltiazem  120 mg Oral BID  . famotidine  20 mg Oral Daily  . ferrous sulfate  325 mg Oral TID WC  . latanoprost  1  drop  Both Eyes QHS  . levofloxacin  750 mg Oral Q48H  . LORazepam  0.5 mg Oral BID  . losartan  100 mg Oral Daily  . methylPREDNISolone (SOLU-MEDROL) injection  60 mg Intravenous Q6H  . montelukast  10 mg Oral QHS  . pantoprazole  40 mg Oral Daily  . potassium chloride SA  20 mEq Oral BID  . potassium chloride  40 mEq Oral BID  . torsemide  40 mg Oral BID  . vitamin B-12  1,000 mcg Oral Daily   Continuous Infusions:    Charlynne Cousins  [facility] Pager [phone]. If 8PM-8AM, please contact night-coverage at [email or web address], password [facility] 06/16/2013, 9:40 AM  LOS: 2 days

## 2013-06-16 NOTE — Evaluation (Signed)
Clinical/Bedside Swallow Evaluation Patient Details  Name: [name]. MRN: [id number] Date of Birth: 1931-02-18  Today's Date: 06/16/2013 Time: 1130-1145 SLP Time Calculation (min): 15 min  Past Medical History:  Past Medical History  Diagnosis Date  . CORONARY HEART DISEASE     a. s/p CABG;  b.  cath 06/27/10: S-Dx occluded, S-PDA 80-90% (tx with PCI); S-OM ok, L-LAD ok;  EF 65-70%  c.  s/p Promus DES to S-PDA 06/2010;   d. Myoview 8/12: low risk  . FIBRILLATION, ATRIAL     post op; ?documented during hosp. 06/2010  . SLEEP APNEA 09/2001    NPSG AHI 22/HR  . ALLERGIC RHINITIS   . ASTHMA   . Depression   . GERD     with HH, hx esophageal stricture  . DYSLIPIDEMIA   . HYPERTENSION     Echo 3/12: EF 55-60%; mod LVH; mild AS/AI; LAE; PASP 38; mild pulmo HTN  . Personal history of alcoholism   . Diverticulosis   . Benign liver cyst   . Skin cancer     L forearm  . Cataract     surgery to both eyes  . Esophageal stricture   . Hiatal hernia   . Pneumonia 04/2013 hosp   Past Surgical History:  Past Surgical History  Procedure Laterality Date  . Coronary artery bypass graft  02/2007  . Hernia repair  12/03/07  . Tonsillectomy    . Coronary angioplasty with stent placement  07/2010  . Cataract extraction, bilateral  2007  . Hernia repair  unsure ?60's  . Cholecystectomy  02/21/2011    Procedure: LAPAROSCOPIC CHOLECYSTECTOMY WITH INTRAOPERATIVE CHOLANGIOGRAM;  Surgeon: Pedro Earls, MD;  Location: [facility];  Service: General;  Laterality: N/A;   HPI:  Who was in the hospital 2 months ago with PNA and CHF.  Has been doing well outpatient until last night when he developed fever, cough which caused him to vomit.  He endorses SOB and cough, wheezing.      Assessment / Plan / Recommendation Clinical Impression  No overt s/s of dysphagia or aspiration noted at b/s.  Swallow is timely, good laryngeal elevation palpated, voice clear after swallows, and no cough or throat clearing  observed.  Pt. denies dysphagia.    Aspiration Risk  Mild    Diet Recommendation Regular;Thin liquid   Liquid Administration via: Cup Medication Administration: Whole meds with liquid Supervision: Patient able to self feed;Intermittent supervision to cue for compensatory strategies Compensations: Slow rate;Small sips/bites Postural Changes and/or Swallow Maneuvers: Seated upright 90 degrees    Other  Recommendations Oral Care Recommendations: Oral care BID   Follow Up Recommendations       Frequency and Duration        Pertinent Vitals/Pain n/a    SLP Swallow Goals     Swallow Study Prior Functional Status       General HPI: Who was in the hospital 2 months ago with PNA and CHF.  Has been doing well outpatient until last night when he developed fever, cough which caused him to vomit.  He endorses SOB and cough, wheezing.    Type of Study: Bedside swallow evaluation Previous Swallow Assessment: none Diet Prior to this Study: Regular;Thin liquids Temperature Spikes Noted: No Respiratory Status: Nasal cannula History of Recent Intubation: No Behavior/Cognition: Alert;Cooperative;Pleasant mood Oral Cavity - Dentition: Dentures, top (no lower teeth) Self-Feeding Abilities: Able to feed self Patient Positioning: Upright in bed Baseline Vocal Quality: Clear Volitional Cough: Strong  Volitional Swallow: Able to elicit    Oral/Motor/Sensory Function Overall Oral Motor/Sensory Function: Appears within functional limits for tasks assessed   Ice Chips Ice chips: Within functional limits Presentation: Spoon   Thin Liquid Thin Liquid: Within functional limits Presentation: Cup;Straw    Nectar Thick Nectar Thick Liquid: Not tested   Honey Thick Honey Thick Liquid: Not tested   Puree Puree: Within functional limits Presentation: Self Fed   Solid   GO    Solid: Within functional limits Presentation: Self [name], [name] 06/16/2013,1:22 PM

## 2013-06-17 DIAGNOSIS — I5031 Acute diastolic (congestive) heart failure: Secondary | ICD-10-CM

## 2013-06-17 DIAGNOSIS — I10 Essential (primary) hypertension: Secondary | ICD-10-CM

## 2013-06-17 DIAGNOSIS — J189 Pneumonia, unspecified organism: Principal | ICD-10-CM

## 2013-06-17 LAB — BASIC METABOLIC PANEL
BUN: 40 mg/dL — AB (ref 6–23)
CO2: 25 meq/L (ref 19–32)
Calcium: 9.2 mg/dL (ref 8.4–10.5)
Chloride: 106 mEq/L (ref 96–112)
Creatinine, Ser: 0.99 mg/dL (ref 0.50–1.35)
GFR calc Af Amer: 87 mL/min — ABNORMAL LOW (ref >90)
GFR calc non Af Amer: 75 mL/min — ABNORMAL LOW (ref >90)
GLUCOSE: 172 mg/dL — AB (ref 70–99)
Potassium: 4.3 mEq/L (ref 3.7–5.3)
Sodium: 144 mEq/L (ref 137–147)

## 2013-06-17 MED ORDER — TORSEMIDE 20 MG PO TABS
40.0000 mg | ORAL_TABLET | Freq: Two times a day (BID) | ORAL | Status: DC
  Administered 2013-06-17: 40 mg via ORAL
  Filled 2013-06-17 (×3): qty 2

## 2013-06-17 MED ORDER — LEVOFLOXACIN 750 MG PO TABS: 750.0000 mg | ORAL_TABLET | ORAL | Status: DC

## 2013-06-17 MED ORDER — ISOSORB DINITRATE-HYDRALAZINE 20-37.5 MG PO TABS: 1.0000 | ORAL_TABLET | Freq: Two times a day (BID) | ORAL | Status: DC

## 2013-06-17 NOTE — Progress Notes (Signed)
DC IV, DC Home. Discharge instructions and home medications discussed with patient. Patient denied any questions or concerns at this time. Patient leaving unit via wheelchair and appears in no acute distress.

## 2013-06-17 NOTE — Discharge Summary (Addendum)
Physician Discharge Summary  [name]. JJK:[id number] DOB: 06-01-1930 DOA: 06/14/2013  PCP: Gwendolyn Grant, MD  Admit date: 06/14/2013 Discharge date: 06/17/2013  Time spent: 35 minutes  Recommendations for Outpatient Follow-up:  1. Follow up with Dr. Percival Spanish as an outpatient in 1 week. 2. b-met at that time. Estimated dry weight standing 67.45 kg. 3. Follow up with PCP, monitor pulmonary status.  Discharge Diagnoses:  Active Problems:   HCAP (healthcare-associated pneumonia)   HYPERTENSION   Chronic diastolic heart failure   PAF (paroxysmal atrial fibrillation)   Fever   Vomiting   Acute diastolic heart failure   Discharge Condition: stable  Diet recommendation: low sodium fluid restricted diet.  Filed Weights   06/15/13 0702 06/16/13 0544 06/17/13 0609  Weight: 68.312 kg (150 lb 9.6 oz) 67.45 kg (148 lb 11.2 oz) 67.45 kg (148 lb 11.2 oz)    History of present illness:  80 y.o. male  Who was in the hospital 2 months ago with PNA and CHF. Has been doing well outpatient until last night when he developed fever, cough which caused him to vomit. He endorses SOB and cough, wheezing.  Denies trouble swallowing or coughing with eating on a normal basis.      Hospital Course:  Fever due to HCAP (healthcare-associated pneumonia)  -  Started on vanc and cefepime 3.8.2015, deescalte to oral levaquin on 3.9.2015 once afebrile.  - Cultures negative.  - SLP no signs of aspiration.  Acute on chronic diastolic heart failure:  - give higher dose of oral torsemide - with improvement in fluid status. - Daily weight and improved. Estimated dry weight 67.5 kg. - Home on current dose of torsemide. - Fluid restrict, low sodium diet.  Cardiology appointment in 1 week.  PAF (paroxysmal atrial fibrillation)  - rate controlled, cont eliquis.   HYPERTENSION  - Bp controlled.    Procedures:  CXR  Consultations:  none  Discharge Exam: Filed Vitals:   06/17/13 0652   BP: 161/62  Pulse: 61  Temp:   Resp:     General: A&O x3 Cardiovascular: RRR Respiratory: good air movement CTA B/L  Discharge Instructions      Discharge Orders   Future Appointments Provider Department Dept Phone   06/30/2013 8:50 AM Liliane Shi, PA-C [place] [facility] [phone]   07/07/2013 9:00 [place], [place] [phone]   07/11/2013 9:15 AM Deneise Lever, MD [facility] [phone]   07/24/2013 1:45 PM Minus Breeding, MD [place] [facility] [phone]   08/13/2013 3:30 PM Rowe Clack, MD [facility] [phone]   Future Orders Complete By Expires   Diet - low sodium heart healthy  As directed    Increase activity slowly  As directed        Medication List    STOP taking these medications       cloNIDine 0.3 MG tablet  Commonly known as:  CATAPRES      TAKE these medications       acidophilus Caps capsule  Take 1 capsule by mouth daily. [facility] probiotics     albuterol 108 (90 BASE) MCG/ACT inhaler  Commonly known as:  PROVENTIL HFA;VENTOLIN HFA  Inhale into the lungs every 6 (six) hours as needed for wheezing or shortness of breath.     bisoprolol 10 MG tablet  Commonly known as:  ZEBETA  Take 1 tablet (10 mg total) by mouth daily.  budesonide 0.25 MG/2ML nebulizer solution  Commonly known as:  PULMICORT  Take 0.25 mg by nebulization 2 (two) times daily as needed. For asthma     calcium-vitamin D 500-200 MG-UNIT per tablet  Commonly known as:  OSCAL WITH D  Take 1 tablet by mouth 2 (two) times daily.     cyanocobalamin 1000 MCG tablet  Take 1 tablet (1,000 mcg total) by mouth daily.     denosumab 60 MG/ML Soln injection  Commonly known as:  PROLIA  Inject 60 mg into the skin every 6 (six) months. Administer in upper arm, thigh, or abdomen     diltiazem 120 MG 24 hr capsule  Commonly known as:  CARDIZEM CD  Take 1  capsule (120 mg total) by mouth 2 (two) times daily.     DUONEB 0.5-2.5 (3) MG/3ML Soln  Generic drug:  ipratropium-albuterol  Take 3 mLs by nebulization every 6 (six) hours as needed (for shortness of breath).     ELIQUIS 5 MG Tabs tablet  Generic drug:  apixaban  Take 5 mg by mouth 2 (two) times daily.     famotidine 20 MG tablet  Commonly known as:  PEPCID  Take 20 mg by mouth daily.     ferrous sulfate 325 (65 FE) MG tablet  Take 1 tablet (325 mg total) by mouth 3 (three) times daily with meals.     isosorbide-hydrALAZINE 20-37.5 MG per tablet  Commonly known as:  BIDIL  Take 1 tablet by mouth 2 (two) times daily.     latanoprost 0.005 % ophthalmic solution  Commonly known as:  XALATAN  Place 1 drop into both eyes at bedtime.     levofloxacin 750 MG tablet  Commonly known as:  LEVAQUIN  Take 1 tablet (750 mg total) by mouth every other day.     loperamide 2 MG capsule  Commonly known as:  IMODIUM  Take 2 mg by mouth daily as needed for diarrhea or loose stools.     LORazepam 0.5 MG tablet  Commonly known as:  ATIVAN  Take 0.5 mg by mouth 2 (two) times daily.     losartan 100 MG tablet  Commonly known as:  COZAAR  Take 1 tablet (100 mg total) by mouth daily.     mometasone-formoterol 100-5 MCG/ACT Aero  Commonly known as:  DULERA  Inhale 2 puffs into the lungs 2 (two) times daily.     montelukast 10 MG tablet  Commonly known as:  SINGULAIR  Take 10 mg by mouth at bedtime.     nitroGLYCERIN 0.4 MG SL tablet  Commonly known as:  NITROSTAT  Place 0.4 mg under the tongue every 5 (five) minutes as needed for chest pain.     omeprazole 20 MG capsule  Commonly known as:  PRILOSEC  Take 1 capsule (20 mg total) by mouth daily.     potassium chloride SA 20 MEQ tablet  Commonly known as:  K-DUR,KLOR-CON  Take 20 mEq by mouth 2 (two) times daily.     pravastatin 80 MG tablet  Commonly known as:  PRAVACHOL  Take 80 mg by mouth daily.     promethazine 25 MG  tablet  Commonly known as:  PHENERGAN  Take 1 tablet (25 mg total) by mouth every 6 (six) hours as needed for nausea or vomiting.     torsemide 20 MG tablet  Commonly known as:  DEMADEX  Take 1 tablet (20 mg total) by mouth daily.  Allergies  Allergen Reactions  . Ace Inhibitors     SEVERE ASTHMA (COPD)  . Codeine Nausea And Vomiting  . Lasix [Furosemide] Itching   Follow-up Information   Follow up with Gwendolyn Grant, MD On 07/07/2013. (@9 :00am spoke with Hoyle Sauer )    Specialty:  Internal Medicine   Contact information:   [address] [place] [address] [phone]       Follow up with Minus Breeding, MD On 06/30/2013. (@8 :50 am with Richardson Dopp spoke with [facility] )    Specialty:  Cardiology   Contact information:   [address] [place] [address] [phone]        The results of significant diagnostics from this hospitalization (including imaging, microbiology, ancillary and laboratory) are listed below for reference.    Significant Diagnostic Studies: Dg Chest 2 View  06/14/2013   CLINICAL DATA:  Shortness of breath, cough.  EXAM: CHEST  2 VIEW  COMPARISON:  05/04/2013  FINDINGS: Interval resolution of the previously seen left lung consolidation. New area of right perihilar and infrahilar opacity/ infiltrate concerning for pneumonia. No effusions. Cardiomegaly. Prior CABG. No acute bony abnormality.  IMPRESSION: Findings concerning for right perihilar and infrahilar pneumonia.  Resolution of the previously seen left pneumonia.   Electronically Signed   By: Rolm Baptise M.D.   On: 06/14/2013 13:51    Microbiology: Recent Results (from the past 240 hour(s))  CULTURE, BLOOD (ROUTINE X 2)     Status: None   Collection Time    06/14/13 11:30 AM      Result Value Ref Range Status   Specimen Description BLOOD RIGHT FOREARM   Final   Special Requests BOTTLES DRAWN AEROBIC AND ANAEROBIC 10CC   Final   Culture  Setup Time      Final   Value: 06/14/2013 19:21     Performed at [facility]   Culture     Final   Value:        BLOOD CULTURE RECEIVED NO GROWTH TO DATE CULTURE WILL BE HELD FOR 5 DAYS BEFORE ISSUING A FINAL NEGATIVE REPORT     Performed at [facility]   Report Status PENDING   Incomplete  CULTURE, BLOOD (ROUTINE X 2)     Status: None   Collection Time    06/14/13 11:52 AM      Result Value Ref Range Status   Specimen Description BLOOD LEFT ANTECUBITAL   Final   Special Requests BOTTLES DRAWN AEROBIC AND ANAEROBIC 10CC   Final   Culture  Setup Time     Final   Value: 06/14/2013 19:21     Performed at [facility]   Culture     Final   Value:        BLOOD CULTURE RECEIVED NO GROWTH TO DATE CULTURE WILL BE HELD FOR 5 DAYS BEFORE ISSUING A FINAL NEGATIVE REPORT     Performed at [facility]   Report Status PENDING   Incomplete  URINE CULTURE     Status: None   Collection Time    06/14/13 12:24 PM      Result Value Ref Range Status   Specimen Description URINE, RANDOM   Final   Special Requests NONE   Final   Culture  Setup Time     Final   Value: 06/14/2013 19:35     Performed at [place]     Final  Value: 25,000 COLONIES/ML     Performed at [facility]   Culture     Final   Value: Multiple bacterial morphotypes present, none predominant. Suggest appropriate recollection if clinically indicated.     Performed at [facility]   Report Status 06/15/2013 FINAL   Final  CLOSTRIDIUM DIFFICILE BY PCR     Status: None   Collection Time    06/16/13  6:23 AM      Result Value Ref Range Status   C difficile by pcr NEGATIVE  NEGATIVE Final     Labs: Basic Metabolic Panel:  Recent Labs Lab 06/14/13 1130 06/16/13 0540 06/17/13 0343  NA 139 144 144  K 4.0 3.1* 4.3  CL 102 103 106  CO2 24 27 25   GLUCOSE 97 190* 172*  BUN 12 30* 40*  CREATININE 0.88 1.03 0.99  CALCIUM 9.0 8.9 9.2   Liver Function  Tests:  Recent Labs Lab 06/14/13 1130  AST 20  ALT 20  ALKPHOS 73  BILITOT 0.6  PROT 6.5  ALBUMIN 3.8   No results found for this basename: LIPASE, AMYLASE,  in the last 168 hours No results found for this basename: AMMONIA,  in the last 168 hours CBC:  Recent Labs Lab 06/14/13 1130  WBC 16.3*  NEUTROABS 14.2*  HGB 13.8  HCT 39.8  MCV 88.2  PLT 180   Cardiac Enzymes: No results found for this basename: CKTOTAL, CKMB, CKMBINDEX, TROPONINI,  in the last 168 hours BNP: BNP (last 3 results)  Recent Labs  03/08/13 2023 03/14/13 1047 05/04/13 1008  PROBNP 236.4 755.0* 1749.0*   CBG: No results found for this basename: GLUCAP,  in the last 168 hours   Signed:  Charlynne Cousins  [facility] 06/17/2013, 8:59 AM

## 2013-06-17 NOTE — Progress Notes (Signed)
Pt. Alert and oriented this am. No s/s of distress or discomfort noted during the night. Pt. Up and ambulating in the hallway during the am. Pt. Denies pain. VSS. Blood pressure 161/62, pulse 61, temperature 97.5 F (36.4 C), temperature source Oral, resp. rate 18, height 5\' 2"  (1.575 m), weight 67.45 kg (148 lb 11.2 oz), SpO2 99.00%. RN will continue to monitor pt. For changes in condition. Cainen Burnham, Katherine Roan

## 2013-06-20 LAB — CULTURE, BLOOD (ROUTINE X 2)
CULTURE: NO GROWTH
Culture: NO GROWTH

## 2013-06-25 ENCOUNTER — Encounter: Payer: Self-pay | Admitting: Internal Medicine

## 2013-06-25 ENCOUNTER — Ambulatory Visit (INDEPENDENT_AMBULATORY_CARE_PROVIDER_SITE_OTHER): Payer: Medicare Other | Admitting: Internal Medicine

## 2013-06-25 VITALS — BP 140/110 | HR 74 | Temp 98.4°F | Wt 150.0 lb

## 2013-06-25 DIAGNOSIS — I951 Orthostatic hypotension: Secondary | ICD-10-CM

## 2013-06-25 DIAGNOSIS — G450 Vertebro-basilar artery syndrome: Secondary | ICD-10-CM

## 2013-06-25 DIAGNOSIS — H8110 Benign paroxysmal vertigo, unspecified ear: Secondary | ICD-10-CM

## 2013-06-25 NOTE — Progress Notes (Signed)
   Subjective:    Patient ID: Stephen Door., male    DOB: November 13, 1930, 80 y.o.   MRN: [id number]  HPI    For several weeks he's had some vertiginous symptoms mainly turning over in bed. This also occurs with neck extension  This was so severe 06/23/13 that he was actually staggering and falling against the wall.  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  He has paroxysmal atrial fibrillation. In the last several months he's been hospitalized for pneumonia on 2 occasions  He has no past history of head injury, loss of consciousness, or seizures        Review of Systems He's had some chills and nausea without associated fever.  He has no frontal headache, facial pain, nasal purulence  There is no discharge or otic pain.Diplopia corrected with lenses. He has not been taking his diuretic on a regular basis due to frequency of urination.    Objective:   Physical Exam General appearance:adequately  nourished; no acute distress or increased work of breathing is present.  No  lymphadenopathy about the head, neck, or axilla noted.   Eyes: No conjunctival inflammation or lid edema is present. There is no scleral icterus.EOMI w/o nystagmus  Ears:  External ear exam shows no significant lesions or deformities.  Otoscopic examination reveals clear canals, tympanic membranes are scarred bilaterally without bulging, retraction, inflammation or discharge.  Nose:  External nasal examination shows no deformity or inflammation. Nasal mucosa aredry without lesions or exudates. No septal dislocation or deviation.No obstruction to airflow.   Oral exam: Denture upper, mandible edentulous; lips and gums are healthy appearing.There is no oropharyngeal erythema or exudate noted.   Neck:  No deformities, masses, or tenderness noted.   Decreased lateral range of motion without pain.    Heart:  Normal rate and regular rhythm. S1 and S2 normal without gallop, click, rub or other extra sounds. Grade 0.9-9/8 systolic murmur, right greater than left.  Lungs:Chest clear to auscultation; no wheezes, rhonchi,rales ,or rubs present.No increased work of breathing.    Extremities:  No cyanosis, edema, or clubbing  noted Cyst R index finger   Skin: Warm & dry w/o jaundice or tenting.         Assessment & Plan:  #1 dizziness, multifactorial. The major component appears to be benign positional vertigo although there is also a component of some vertebrobasilar insufficiency with neck hyperextension. He also describes some postural hypotension symptoms  #2 proximally for fibrillation; this time he is in normal sinus rhythm  Plan: See orders

## 2013-06-25 NOTE — Progress Notes (Signed)
Pre visit review using our clinic review tool, if applicable. No additional management support is needed unless otherwise documented below in the visit note. 

## 2013-06-25 NOTE — Patient Instructions (Addendum)
Perform isometric exercise of calves  ( while seated go up on toes to count of 5 & then onto heels for 5 count). Repeat  4- 5 times prior to standing if you've been seated or supine for any significant period of time as BP drops with such positions.   When you hyperextend your neck to look over your head; this compromises the blood flow through the vertebral arteries and the basilar artery the two vertebral arteries join  to form. The basilar artery provides blood flow to the cerebellum, the balance portion of your brain. Typically there is also a component of arthritis in the cervical (neck) spine which helps to compromise the blood flow to the vertebrobasilar system with the neck hyperextended. Unfortunately the only treatment would be to avoid this head and neck position.  Go to Web M.D. for information on benign positional vertigo (BPV) . Physical therapy exercises can treat that. Please try half a pill fluid pill every day to see if this helps the benign positional vertigo symptoms until you're seen by physical therapy.

## 2013-06-26 ENCOUNTER — Telehealth: Payer: Self-pay | Admitting: Cardiology

## 2013-06-26 NOTE — Telephone Encounter (Signed)
Per wife - pt has been itching and has a red rash on his chest and arms.  This started after he started Bidil in the hospital.  He has not tried to take anything for the itching.  suggested he try Benadryl OTC and that I will ask Dr Percival Spanish about a medication change.  Pt does have an appointment on Monday with Richardson Dopp, PA.

## 2013-06-26 NOTE — Telephone Encounter (Signed)
Wife aware I reviewed with Dr Percival Spanish who agrees the pt should continue on meds as listed, try Benadryl OTC and reevaluate on Monday as his appt.  She states understanding.

## 2013-06-26 NOTE — Telephone Encounter (Signed)
New message     Wife calling patient recently discharge from hospital on new medication - C/O itching from medication , blood pressure is high with today reading.  183/92 . 196/106, 180/93, 173/73, 180/91    No chest pain , no sob.

## 2013-06-30 ENCOUNTER — Ambulatory Visit (INDEPENDENT_AMBULATORY_CARE_PROVIDER_SITE_OTHER): Payer: Medicare Other | Admitting: Physician Assistant

## 2013-06-30 ENCOUNTER — Encounter: Payer: Self-pay | Admitting: Physician Assistant

## 2013-06-30 VITALS — BP 150/60 | HR 66 | Ht 62.0 in | Wt 155.0 lb

## 2013-06-30 DIAGNOSIS — J4489 Other specified chronic obstructive pulmonary disease: Secondary | ICD-10-CM

## 2013-06-30 DIAGNOSIS — I4891 Unspecified atrial fibrillation: Secondary | ICD-10-CM

## 2013-06-30 DIAGNOSIS — I2510 Atherosclerotic heart disease of native coronary artery without angina pectoris: Secondary | ICD-10-CM

## 2013-06-30 DIAGNOSIS — J449 Chronic obstructive pulmonary disease, unspecified: Secondary | ICD-10-CM

## 2013-06-30 DIAGNOSIS — I5032 Chronic diastolic (congestive) heart failure: Secondary | ICD-10-CM

## 2013-06-30 DIAGNOSIS — I10 Essential (primary) hypertension: Secondary | ICD-10-CM

## 2013-06-30 MED ORDER — CLONIDINE HCL 0.1 MG PO TABS: 0.1000 mg | ORAL_TABLET | Freq: Two times a day (BID) | ORAL | Status: DC

## 2013-06-30 NOTE — Patient Instructions (Signed)
STOP BIDIL  START CLONIDINE 0.1 MG TWICE DAILY  MONITOR BLOOD PRESSURE DAILY; IF YOUR BP IS STAYING ABOVE 150/90 AFTER TAKING CLONIDINE 0.1 TWICE DAILY FOR ABOUT 2-3 DAYS THEN YOU WILL INCREASE TO CLONIDINE 0.2 MG TWICE DAILY AND CALL THE OFFICE TO LET SCOTT WEAVER, PAC KNOW THAT YOU HAD TO INCREASE THE CLONIDINE TO 0.2 MG  MAKE SURE TO KEEP YOUR FOLLOW UP WITH DR. [facility] 07/24/13

## 2013-06-30 NOTE — Progress Notes (Signed)
[address] [name], [place]  [address] Phone: [phone] Fax:  [phone]  Date:  06/30/2013   ID:  [name]., DOB 08-Dec-1930, MRN [id number]  PCP:  Gwendolyn Grant, MD  Cardiologist:  Dr. Minus Breeding     History of Present Illness: [name]. is a 80 y.o. male with a hx of CAD, s/p CABG, s/p Promus DES to S-PDA in 06/2010, HTN, HL, PAD, severe chronic asthma (followed by Dr. Annamaria Boots).    Admitted in 02/2013 with tachycardia and PAF.  He converted to NSR with rate control Rx.  CHADS2-VASc=4.  He was placed on Eliquis.   He required further medication adjustments after d/c for rapid rates.  Event monitor in 03/2013 demonstrated NSR, sinus brady.    Follow up labs demonstrated worsening anemia with Fe deficiency.  He was referred to Dr. Henrene Pastor.  He was switched to PPI. He was felt to be high risk for endoscopic evaluation and no further workup was planned.    Admitted 04/2013 with volume excess in the setting of pneumonia.    Re-admitted 3/7-3/10 with healthcare associated pneumonia complicated by acute on chronic diastolic CHF.  He returns for follow up.  His breathing is better. He denies chest pain. He is NYHA class 2-2b. He denies orthopnea or PND. He denies LE edema. He denies syncope. His cough is improved. He was working out in his yard this past weekend without difficulty. His biggest complaint is that of the changes in his medications for blood pressure that occurred in the hospital. He was typically well controlled on clonidine 0.3 mg twice a day. He was changed to isosorbide/hydralazine in the hospital. He feels that this is making him itch.  Studies:  - Myoview (12/2010):  Ant apex, ant wall ant lat wall ischemia (mild), EF 69%.  This was a low risk study and med Rx was continued.    - Carotid US (10/2012):  bilat 40-59% ICA, probable L subclavian stenosis with mild steal (repeat 1  Year).    - Echo (03/09/13):  Mild LVH, EF 55-60%, trivial AI, MAC,  mild LAE, mild RVE, mild RAE, PASP 41.   - Chest CTA (03/09/13):  Neg for pulmonary embolus.    - Event Monitor (03/2013):  NSR, sinus brady; no AFib.     Recent Labs: 10/16/2012: HDL Cholesterol 41.10; LDL (calc) 60  03/09/2013: TSH 1.170  05/04/2013: Pro B Natriuretic peptide (BNP) 1749.0*  06/14/2013: ALT 20; Hemoglobin 13.8  06/17/2013: Creatinine 0.99; Potassium 4.3   Wt Readings from Last 3 Encounters:  06/30/13 155 lb (70.308 kg)  06/25/13 150 lb 0.6 oz (68.058 kg)  06/17/13 148 lb 11.2 oz (67.45 kg)     Past Medical History  Diagnosis Date  . CORONARY HEART DISEASE     a. s/p CABG;  b.  cath 06/27/10: S-Dx occluded, S-PDA 80-90% (tx with PCI); S-OM ok, L-LAD ok;  EF 65-70%  c.  s/p Promus DES to S-PDA 06/2010;   d. Myoview 8/12: low risk  . FIBRILLATION, ATRIAL     post op; ?documented during hosp. 06/2010  . SLEEP APNEA 09/2001    NPSG AHI 22/HR  . ALLERGIC RHINITIS   . ASTHMA   . Depression   . GERD     with HH, hx esophageal stricture  . DYSLIPIDEMIA   . HYPERTENSION     Echo 3/12: EF 55-60%; mod LVH; mild AS/AI; LAE; PASP 38; mild pulmo HTN  . Personal  history of alcoholism   . Diverticulosis   . Benign liver cyst   . Skin cancer     L forearm  . Cataract     surgery to both eyes  . Esophageal stricture   . Hiatal hernia   . Pneumonia 04/2013 hosp    Current Outpatient Prescriptions  Medication Sig Dispense Refill  . acidophilus (RISAQUAD) CAPS Take 1 capsule by mouth daily. [facility] probiotics      . albuterol (PROVENTIL HFA;VENTOLIN HFA) 108 (90 BASE) MCG/ACT inhaler Inhale into the lungs every 6 (six) hours as needed for wheezing or shortness of breath.      Marland Kitchen apixaban (ELIQUIS) 5 MG TABS tablet Take 5 mg by mouth 2 (two) times daily.      . bisoprolol (ZEBETA) 10 MG tablet Take 1 tablet (10 mg total) by mouth daily.  30 tablet  5  . budesonide (PULMICORT) 0.25 MG/2ML nebulizer solution Take 0.25 mg by nebulization 2 (two) times daily as needed. For asthma       . calcium-vitamin D (OSCAL WITH D) 500-200 MG-UNIT per tablet Take 1 tablet by mouth 2 (two) times daily.      Marland Kitchen denosumab (PROLIA) 60 MG/ML SOLN injection Inject 60 mg into the skin every 6 (six) months. Administer in upper arm, thigh, or abdomen  1 mL  5  . diltiazem (CARDIZEM CD) 120 MG 24 hr capsule Take 1 capsule (120 mg total) by mouth 2 (two) times daily.  60 capsule  11  . famotidine (PEPCID) 20 MG tablet Take 20 mg by mouth daily.       . ferrous sulfate 325 (65 FE) MG tablet Take 1 tablet (325 mg total) by mouth 3 (three) times daily with meals.  90 tablet  3  . ipratropium-albuterol (DUONEB) 0.5-2.5 (3) MG/3ML SOLN Take 3 mLs by nebulization every 6 (six) hours as needed (for shortness of breath).      . isosorbide-hydrALAZINE (BIDIL) 20-37.5 MG per tablet Take 1 tablet by mouth 2 (two) times daily.  60 tablet  3  . latanoprost (XALATAN) 0.005 % ophthalmic solution Place 1 drop into both eyes at bedtime.       Marland Kitchen levofloxacin (LEVAQUIN) 750 MG tablet Take 1 tablet (750 mg total) by mouth every other day.  3 tablet  0  . loperamide (IMODIUM) 2 MG capsule Take 2 mg by mouth daily as needed for diarrhea or loose stools.      Marland Kitchen LORazepam (ATIVAN) 0.5 MG tablet Take 0.5 mg by mouth 2 (two) times daily.      Marland Kitchen losartan (COZAAR) 100 MG tablet Take 1 tablet (100 mg total) by mouth daily.  30 tablet  1  . mometasone-formoterol (DULERA) 100-5 MCG/ACT AERO Inhale 2 puffs into the lungs 2 (two) times daily.      . montelukast (SINGULAIR) 10 MG tablet Take 10 mg by mouth at bedtime.      . nitroGLYCERIN (NITROSTAT) 0.4 MG SL tablet Place 0.4 mg under the tongue every 5 (five) minutes as needed for chest pain.      Marland Kitchen omeprazole (PRILOSEC) 20 MG capsule Take 1 capsule (20 mg total) by mouth daily.  60 capsule  6  . potassium chloride SA (K-DUR,KLOR-CON) 20 MEQ tablet Take 20 mEq by mouth 2 (two) times daily.      . pravastatin (PRAVACHOL) 80 MG tablet Take 80 mg by mouth daily.      . promethazine  (PHENERGAN) 25 MG tablet Take 1 tablet (25  mg total) by mouth every 6 (six) hours as needed for nausea or vomiting.  30 tablet  0  . torsemide (DEMADEX) 20 MG tablet Take 1 tablet (20 mg total) by mouth daily.  30 tablet  1  . vitamin B-12 1000 MCG tablet Take 1 tablet (1,000 mcg total) by mouth daily.  30 tablet  1   No current facility-administered medications for this visit.    Allergies:   Ace inhibitors; Codeine; and Lasix   Social History:  The patient  reports that he quit smoking about 34 years ago. His smoking use included Cigarettes. He has a 120 pack-year smoking history. He has never used smokeless tobacco. He reports that he does not drink alcohol or use illicit drugs.   Family History:  The patient's family history includes Asthma in his sister; COPD in his sister; Emphysema in his sister; Heart disease in his brother, father, and mother; Hypertension in his mother and sister. There is no history of Colon cancer.   ROS:  Please see the history of present illness.     All other systems reviewed and negative.   PHYSICAL EXAM: VS:  BP 150/60  Pulse 66  Ht 5\' 2"  (1.575 m)  Wt 155 lb (70.308 kg)  BMI 28.34 kg/m2 Well nourished, well developed, in no acute distress HEENT: normal Neck: no JVD Cardiac:  distant S1, S2; RRR; 2/6 systolic murmurat RUSB Lungs:  Decreased breath sounds bilaterally, expiratory rhonchi throughout Abd: soft, nontender, no hepatomegaly Ext:  No LE edema Skin: warm and dry Neuro:  CNs 2-12 intact, no focal abnormalities noted  EKG:   NSR, HR 66, normal axis, nonspecific ST-T wave changes  ASSESSMENT AND PLAN:  1. Atrial Fibrillation:  Maintaining NSR. He continues to tolerate Eliquis. Continue current therapy. 2. Severe Asthma:  F/u with pulmonary as planned.  3. CAD:  No angina.  He is not on aspirin as he is on Apixaban. Continue statin.  4. Chronic Diastolic CHF:  Volume stable. Continue current therapy. 5. Hypertension:  He is not tolerating  BiDil. This will be stopped. I will place him back on clonidine 0.1 mg twice a day. He keeps a close eye on his blood pressure. He will increase his clonidine to 0.2 mg twice a day if his blood pressure remains elevated. We will likely continue to titrate him back to his usual dose of 0.3 mg twice a day 6. Hyperlipidemia:  Continue statin. 7. Carotid Stenosis:  Follow up US due in 10/2013. 8. Recent Recurrent Pneumonia:  Resolved.   9. Iron Deficiency Anemia:  He has seen GI.  No plans for endoscopy unless he has overt bleeding.  He remains on Iron.  Recent Hgb was improved.   10. Disposition:  Follow up with Dr. Percival Spanish next month as planned.   Signed, Richardson Dopp, PA-C  06/30/2013 9:12 AM

## 2013-07-07 ENCOUNTER — Ambulatory Visit (INDEPENDENT_AMBULATORY_CARE_PROVIDER_SITE_OTHER): Payer: Medicare Other | Admitting: Internal Medicine

## 2013-07-07 ENCOUNTER — Encounter: Payer: Self-pay | Admitting: Internal Medicine

## 2013-07-07 VITALS — BP 162/80 | HR 56 | Temp 98.2°F | Wt 155.2 lb

## 2013-07-07 DIAGNOSIS — R5383 Other fatigue: Secondary | ICD-10-CM

## 2013-07-07 DIAGNOSIS — R5381 Other malaise: Secondary | ICD-10-CM

## 2013-07-07 DIAGNOSIS — D509 Iron deficiency anemia, unspecified: Secondary | ICD-10-CM

## 2013-07-07 DIAGNOSIS — R531 Weakness: Secondary | ICD-10-CM

## 2013-07-07 DIAGNOSIS — I4891 Unspecified atrial fibrillation: Secondary | ICD-10-CM

## 2013-07-07 DIAGNOSIS — I10 Essential (primary) hypertension: Secondary | ICD-10-CM

## 2013-07-07 DIAGNOSIS — M810 Age-related osteoporosis without current pathological fracture: Secondary | ICD-10-CM

## 2013-07-07 MED ORDER — FERROUS SULFATE 325 (65 FE) MG PO TABS: 325.0000 mg | ORAL_TABLET | Freq: Two times a day (BID) | ORAL | Status: DC

## 2013-07-07 NOTE — Patient Instructions (Addendum)
It was good to see you today.  We have reviewed your prior records including labs and tests today  Medications reviewed and updated, reduce iron pill to twice daily -no other changes recommended at this time. Continue Prolia injections every 6 months  we'll make referral to home therapy for your dizziness. Our office will contact you regarding appointment(s) once made.  Please schedule followup in 3-4 months, call sooner if problems. Ok to cancel [facility] appointment

## 2013-07-07 NOTE — Progress Notes (Signed)
Subjective:    Patient ID: Stephen Door., male    DOB: [birth year], 80 y.o.   MRN: [id number]  HPI  Patient seen today for hospital follow up.  Admitted 3/7 through 3/10 for HCAP.  Hospital Course:  Fever due to HCAP (healthcare-associated pneumonia)  - Started on vanc and cefepime 3.8.2015, deescalte to oral levaquin on 3.9.2015 once afebrile.  - Cultures negative.  - SLP no signs of aspiration.  Acute on chronic diastolic heart failure:  - give higher dose of oral torsemide  - with improvement in fluid status.  - Daily weight and improved. Estimated dry weight 67.5 kg.  - Home on current dose of torsemide.  - Fluid restrict, low sodium diet. Cardiology appointment in 1 week.  PAF (paroxysmal atrial fibrillation)  - rate controlled, cont eliquis.  HYPERTENSION  - Bp controlled.   Since discharge, patient feels that his breathing is improved.  He is working in the yard on a regular basis.  Still with wheezing but controlled with inhalers.  Concerned about blood pressure - readings 170/90s at home.  Recently seen by cardiology with Clonidine started and increased last night to 0.2mg  twice daily.  No CV symptoms.   Past Medical History  Diagnosis Date  . CORONARY HEART DISEASE     a. s/p CABG;  b.  cath 06/27/10: S-Dx occluded, S-PDA 80-90% (tx with PCI); S-OM ok, L-LAD ok;  EF 65-70%  c.  s/p Promus DES to S-PDA 06/2010;   d. Myoview 8/12: low risk  . FIBRILLATION, ATRIAL     post op; ?documented during hosp. 06/2010  . SLEEP APNEA 09/2001    NPSG AHI 22/HR  . ALLERGIC RHINITIS   . ASTHMA   . Depression   . GERD     with HH, hx esophageal stricture  . DYSLIPIDEMIA   . HYPERTENSION     Echo 3/12: EF 55-60%; mod LVH; mild AS/AI; LAE; PASP 38; mild pulmo HTN  . Personal history of alcoholism   . Diverticulosis   . Benign liver cyst   . Skin cancer     L forearm  . Cataract     surgery to both eyes  . Esophageal stricture   . Hiatal hernia   . Pneumonia 04/2013 hosp      Review of Systems  Constitutional: Negative for fever, chills, activity change and appetite change.  HENT: Negative.   Respiratory: Positive for wheezing (chronic). Negative for cough, chest tightness and shortness of breath.   Cardiovascular: Negative for chest pain, palpitations and leg swelling.  Gastrointestinal: Negative for nausea, vomiting, diarrhea and constipation.  Skin: Negative for rash and wound.  Neurological: Negative for dizziness, syncope, weakness and headaches.  Psychiatric/Behavioral: Negative for sleep disturbance. The patient is not nervous/anxious.        Objective:   Physical Exam  Constitutional: He is oriented to person, place, and time. He appears well-developed and well-nourished. No distress.  HENT:  Head: Normocephalic and atraumatic.  Neck: Normal range of motion. Neck supple. No thyromegaly present.  Cardiovascular: Normal rate and regular rhythm.   Murmur (2/6 murmur) heard. Pulmonary/Chest: Effort normal. He has decreased breath sounds. He has wheezes (soft end exp L>R (chronic)). He has no rhonchi. He has no rales.  Lymphadenopathy:    He has no cervical adenopathy.  Neurological: He is alert and oriented to person, place, and time. No cranial nerve deficit. Coordination normal.  Skin: Skin is warm and dry. He is not diaphoretic.  Psychiatric:  He has a normal mood and affect. His behavior is normal. Judgment and thought content normal.    Wt Readings from Last 3 Encounters:  07/07/13 155 lb 3.2 oz (70.398 kg)  06/30/13 155 lb (70.308 kg)  06/25/13 150 lb 0.6 oz (68.058 kg)   BP Readings from Last 3 Encounters:  07/07/13 162/80  06/30/13 150/60  06/25/13 140/110   Lab Results  Component Value Date   WBC 16.3* 06/14/2013   HGB 13.8 06/14/2013   HCT 39.8 06/14/2013   PLT 180 06/14/2013   GLUCOSE 172* 06/17/2013   CHOL 123 10/16/2012   TRIG 111.0 10/16/2012   HDL 41.10 10/16/2012   LDLCALC 60 10/16/2012   ALT 20 06/14/2013   AST 20 06/14/2013   NA  144 06/17/2013   K 4.3 06/17/2013   CL 106 06/17/2013   CREATININE 0.99 06/17/2013   BUN 40* 06/17/2013   CO2 25 06/17/2013   TSH 1.170 03/09/2013   PSA 0.62 10/10/2010   INR 1.19 05/04/2013   HGBA1C 6.4 10/16/2012       Assessment & Plan:   Problem List Items Addressed This Visit   Anemia, iron deficiency      Exacerbated by chronic anticoagulation -Plavix + ASA, changes to Eliquis 03/2013 GI eval 04/2013: felt patient too high risk for repeat endoscopic evaluation at this time monitor serial CBC, encouraged continue oral iron *but ok to decrease from TID to BID) and PPI (prev H2B before 04/2013 GI eval) Iron/TIBC/Ferritin    Component Value Date/Time   IRON 37* 04/21/2013 0957   IRON 37* 04/21/2013 0957   FERRITIN 17.1* 04/21/2013 0957      Relevant Medications      ferrous sulfate tablet 325 mg   Atrial fibrillation with RVR     hosp for same 02/2013, 03/2013 and 06/2013- reviewed highly symptomatic RVR: fatigue and dyspnea - but improving with normalizing HR Would consider tykosin if needed as other med options limitied (cards consult reviewed) reviewed current dilt dose + Zebeta - avoid other beta-blocker due to adv pulm disease using dig 0.125 prn HR>100 (given lack of other med choices) conitnue follow up closely with cards - continue anticoag - eliquis - in place of prior plavix +[id number] (since 03/10/13 DC) Echo 03/09/13: normal LV fx, no cor pulmonale; CT angio neg for PE and no hypoxia By report patient maintaining SR     Relevant Orders      Ambulatory referral to [facility]      Temporarily stopped amlodipine early 10/2011 due to mild dependant edema, but then resumed started ARB 10/2011 and titrated to max Improved BP ---until exacerbation by hosp for rapid A. Fib November 2014 Amlodipine discontinued in place of diltiazem 04/2012 hosp for RVR Reviewed episode of bradycardia during 02/2013 hospitalization and cards visit 03/2013 - off Dig and dec  Dilt because of same Bidil started hospitalization 06/2012 but unable to tolerate due to itching; placed back on Clonidine per cards with plans for titration per goal.  Pt currently on Clonidine 0.2mg  twice daily (dose increased last night).  Will follow-up with cards next month for further titration - denies active CV symptoms.  Resume clonidine now, dose clarified- sudden DC of same likely contributing to spiked BP Also reports itching due to Lasix, stopped same and resume prior hydrochlorothiazide patient and wife will monitor at home for mgmt of same -  educated on goal: 140s/80s  BP Readings from Last 3 Encounters:  07/07/13 162/80  06/30/13 150/60  06/25/13 140/110      Relevant Orders      Ambulatory referral to Home Health   Osteoporosis     No hx fracture, freq pred use due to COPD Discussed med options for tx 05/2012 -  given hx esophageal stricture, recommended to begin Prolia injections -  Enrolled with patient support program -  1st dose done 05/16/13, repeat q 77mo Continue WB exercise activity and take Ca+D BID Education on same provided at length today     Other Visit Diagnoses   Weakness generalized        Relevant Orders       Ambulatory referral to [facility]

## 2013-07-07 NOTE — Assessment & Plan Note (Addendum)
hosp for same 02/2013, 03/2013 and 06/2013- reviewed highly symptomatic RVR: fatigue and dyspnea - but improving with normalizing HR Would consider tykosin if needed as other med options limitied (cards consult reviewed) reviewed current dilt dose + Zebeta - avoid other beta-blocker due to adv pulm disease using dig 0.125 prn HR>100 (given lack of other med choices) conitnue follow up closely with cards - continue anticoag - eliquis - in place of prior plavix +ASA81 (since 03/10/13 DC) Echo 03/09/13: normal LV fx, no cor pulmonale; CT angio neg for PE and no hypoxia By report patient maintaining SR

## 2013-07-07 NOTE — Assessment & Plan Note (Signed)
No hx fracture, freq pred use due to COPD Discussed med options for tx 05/2012 -  given hx esophageal stricture, recommended to begin Prolia injections -  Enrolled with patient support program -  1st dose done 05/16/13, repeat q 6mo Continue WB exercise activity and take Ca+D BID Education on same provided at length today 

## 2013-07-07 NOTE — Assessment & Plan Note (Addendum)
Temporarily stopped amlodipine early 10/2011 due to mild dependant edema, but then resumed started ARB 10/2011 and titrated to max Improved BP ---until exacerbation by hosp for rapid A. Fib November 2014 Amlodipine discontinued in place of diltiazem 04/2012 hosp for RVR Reviewed episode of bradycardia during 02/2013 hospitalization and cards visit 03/2013 - off Dig and dec Dilt because of same Bidil started hospitalization 06/2012 but unable to tolerate due to itching; placed back on Clonidine per cards with plans for titration per goal.  Pt currently on Clonidine 0.2mg  twice daily (dose increased last night).  Will follow-up with cards next month for further titration - denies active CV symptoms.  Resume clonidine now, dose clarified- sudden DC of same likely contributing to spiked BP Also reports itching due to Lasix, stopped same and resume prior hydrochlorothiazide patient and wife will monitor at home for mgmt of same -  educated on goal: 140s/80s  BP Readings from Last 3 Encounters:  07/07/13 162/80  06/30/13 150/60  06/25/13 140/110

## 2013-07-07 NOTE — Assessment & Plan Note (Addendum)
Exacerbated by chronic anticoagulation -Plavix + ASA, changes to Eliquis 03/2013 GI eval 04/2013: felt patient too high risk for repeat endoscopic evaluation at this time monitor serial CBC, encouraged continue oral iron *but ok to decrease from TID to BID) and PPI (prev H2B before 04/2013 GI eval) Iron/TIBC/Ferritin    Component Value Date/Time   IRON 37* 04/21/2013 0957   IRON 37* 04/21/2013 0957   FERRITIN 17.1* 04/21/2013 0957

## 2013-07-07 NOTE — Progress Notes (Signed)
Pre visit review using our clinic review tool, if applicable. No additional management support is needed unless otherwise documented below in the visit note. 

## 2013-07-08 ENCOUNTER — Telehealth: Payer: Self-pay | Admitting: Internal Medicine

## 2013-07-08 NOTE — Telephone Encounter (Signed)
Relevant patient education mailed to patient.  

## 2013-07-11 ENCOUNTER — Ambulatory Visit: Payer: Medicare Other | Admitting: Internal Medicine

## 2013-07-15 ENCOUNTER — Telehealth: Payer: Self-pay | Admitting: *Deleted

## 2013-07-15 NOTE — Telephone Encounter (Signed)
Message copied by Earnstine Regal on Tue Jul 15, 2013  9:49 AM ------      Message from: Doug Sou, ROSE D      Created: Mon Jul 14, 2013  8:04 AM      Regarding: [facility]re Nick,            I have rec'd the [facility] verification for Mr. Heyer.  If he receives the injection w/an office visit his co-pay is $10, if there is no office visit his co-pay will be 20% which will be $384.  It is ok to go ahead and schedule his injection whenever is most convenient for both him and you.            Have a wonderful week!            Thank you,      Rose ------

## 2013-07-15 NOTE — Telephone Encounter (Signed)
Called pt spoke with his wife gave her msg below concerning prolia injection. They decided to wait until he has appt in June to get the prolia...Johny Chess

## 2013-07-17 ENCOUNTER — Ambulatory Visit (INDEPENDENT_AMBULATORY_CARE_PROVIDER_SITE_OTHER): Payer: Medicare Other | Admitting: Internal Medicine

## 2013-07-17 DIAGNOSIS — J309 Allergic rhinitis, unspecified: Secondary | ICD-10-CM

## 2013-07-18 ENCOUNTER — Other Ambulatory Visit: Payer: Self-pay | Admitting: Internal Medicine

## 2013-07-21 ENCOUNTER — Ambulatory Visit: Payer: Medicare Other | Attending: Internal Medicine | Admitting: Physical Therapy

## 2013-07-21 DIAGNOSIS — H8110 Benign paroxysmal vertigo, unspecified ear: Secondary | ICD-10-CM | POA: Insufficient documentation

## 2013-07-21 DIAGNOSIS — IMO0001 Reserved for inherently not codable concepts without codable children: Secondary | ICD-10-CM | POA: Insufficient documentation

## 2013-07-21 DIAGNOSIS — R269 Unspecified abnormalities of gait and mobility: Secondary | ICD-10-CM | POA: Insufficient documentation

## 2013-07-24 ENCOUNTER — Ambulatory Visit (INDEPENDENT_AMBULATORY_CARE_PROVIDER_SITE_OTHER): Payer: Medicare Other | Admitting: Cardiology

## 2013-07-24 ENCOUNTER — Encounter: Payer: Self-pay | Admitting: Cardiology

## 2013-07-24 VITALS — BP 130/68 | HR 55 | Ht 62.0 in | Wt 158.0 lb

## 2013-07-24 DIAGNOSIS — I480 Paroxysmal atrial fibrillation: Secondary | ICD-10-CM

## 2013-07-24 DIAGNOSIS — I5032 Chronic diastolic (congestive) heart failure: Secondary | ICD-10-CM

## 2013-07-24 DIAGNOSIS — I4891 Unspecified atrial fibrillation: Secondary | ICD-10-CM

## 2013-07-24 DIAGNOSIS — I10 Essential (primary) hypertension: Secondary | ICD-10-CM

## 2013-07-24 MED ORDER — CLONIDINE HCL 0.3 MG PO TABS: 0.3000 mg | ORAL_TABLET | Freq: Two times a day (BID) | ORAL | Status: DC

## 2013-07-24 NOTE — Progress Notes (Signed)
HPI The patient presents for follow up of  CAD, s/p CABG, s/p Promus DES to S-PDA in 06/2010.   He was admitted in Nov with tachycardia and AFib.  He was placed on Eliquis.   He has been having problems with his blood pressure. However, it seems to be better controlled currently with clonidine as listed. He's had some difficulty getting his medications. He says the swelling in his feet is improved. He's had no new shortness of breath, PND or orthopnea. He has had no new chest pressure, neck or arm discomfort. He has had no weight gain.    Allergies  Allergen Reactions  . Ace Inhibitors     SEVERE ASTHMA (COPD)  . Codeine Nausea And Vomiting  . Lasix [Furosemide] Itching    Current Outpatient Prescriptions  Medication Sig Dispense Refill  . acidophilus (RISAQUAD) CAPS Take 1 capsule by mouth daily. [facility] probiotics      . albuterol (PROVENTIL HFA;VENTOLIN HFA) 108 (90 BASE) MCG/ACT inhaler Inhale into the lungs every 6 (six) hours as needed for wheezing or shortness of breath.      Marland Kitchen apixaban (ELIQUIS) 5 MG TABS tablet Take 5 mg by mouth 2 (two) times daily.      . bisoprolol (ZEBETA) 10 MG tablet Take 1 tablet (10 mg total) by mouth daily.  30 tablet  5  . budesonide (PULMICORT) 0.25 MG/2ML nebulizer solution Take 0.25 mg by nebulization 2 (two) times daily as needed. For asthma      . calcium-vitamin D (OSCAL WITH D) 500-200 MG-UNIT per tablet Take 1 tablet by mouth 2 (two) times daily.      . cloNIDine (CATAPRES) 0.1 MG tablet Take 1 tablet (0.1 mg total) by mouth 2 (two) times daily.  60 tablet  11  . denosumab (PROLIA) 60 MG/ML SOLN injection Inject 60 mg into the skin every 6 (six) months. Administer in upper arm, thigh, or abdomen  1 mL  5  . diltiazem (CARDIZEM CD) 120 MG 24 hr capsule Take 1 capsule (120 mg total) by mouth 2 (two) times daily.  60 capsule  11  . famotidine (PEPCID) 20 MG tablet Take 20 mg by mouth daily.       . ferrous sulfate 325 (65 FE) MG tablet Take 1 tablet  (325 mg total) by mouth 2 (two) times daily with a meal.  60 tablet  3  . ipratropium-albuterol (DUONEB) 0.5-2.5 (3) MG/3ML SOLN Take 3 mLs by nebulization every 6 (six) hours as needed (for shortness of breath).      . latanoprost (XALATAN) 0.005 % ophthalmic solution Place 1 drop into both eyes at bedtime.       Marland Kitchen loperamide (IMODIUM) 2 MG capsule Take 2 mg by mouth daily as needed for diarrhea or loose stools.      Marland Kitchen LORazepam (ATIVAN) 0.5 MG tablet Take 0.5 mg by mouth 2 (two) times daily.      Marland Kitchen losartan (COZAAR) 100 MG tablet Take 1 tablet (100 mg total) by mouth daily.  30 tablet  1  . mometasone-formoterol (DULERA) 100-5 MCG/ACT AERO Inhale 2 puffs into the lungs 2 (two) times daily.      . montelukast (SINGULAIR) 10 MG tablet Take 10 mg by mouth at bedtime.      . nitroGLYCERIN (NITROSTAT) 0.4 MG SL tablet Place 0.4 mg under the tongue every 5 (five) minutes as needed for chest pain.      Marland Kitchen omeprazole (PRILOSEC) 20 MG capsule Take 1 capsule (  20 mg total) by mouth daily.  60 capsule  6  . potassium chloride SA (K-DUR,KLOR-CON) 20 MEQ tablet Take 20 mEq by mouth 2 (two) times daily.      . pravastatin (PRAVACHOL) 80 MG tablet Take 80 mg by mouth daily.      . promethazine (PHENERGAN) 25 MG tablet Take 1 tablet (25 mg total) by mouth every 6 (six) hours as needed for nausea or vomiting.  30 tablet  0  . RA VITAMIN B-12 TR 1000 MCG TBCR take 1 tablet by mouth once daily  30 tablet  5  . torsemide (DEMADEX) 20 MG tablet take 1 tablet by mouth once daily  30 tablet  5  . vitamin B-12 1000 MCG tablet Take 1 tablet (1,000 mcg total) by mouth daily.  30 tablet  1   No current facility-administered medications for this visit.    Past Medical History  Diagnosis Date  . CORONARY HEART DISEASE     a. s/p CABG;  b.  cath 06/27/10: S-Dx occluded, S-PDA 80-90% (tx with PCI); S-OM ok, L-LAD ok;  EF 65-70%  c.  s/p Promus DES to S-PDA 06/2010;   d. Myoview 8/12: low risk  . FIBRILLATION, ATRIAL      post op; ?documented during hosp. 06/2010  . SLEEP APNEA 09/2001    NPSG AHI 22/HR  . ALLERGIC RHINITIS   . ASTHMA   . Depression   . GERD     with HH, hx esophageal stricture  . DYSLIPIDEMIA   . HYPERTENSION     Echo 3/12: EF 55-60%; mod LVH; mild AS/AI; LAE; PASP 38; mild pulmo HTN  . Personal history of alcoholism   . Diverticulosis   . Benign liver cyst   . Skin cancer     L forearm  . Cataract     surgery to both eyes  . Esophageal stricture   . Hiatal hernia   . Pneumonia 04/2013 hosp    Past Surgical History  Procedure Laterality Date  . Coronary artery bypass graft  02/2007  . Hernia repair  12/03/07  . Tonsillectomy    . Coronary angioplasty with stent placement  07/2010  . Cataract extraction, bilateral  2007  . Hernia repair  unsure ?60's  . Cholecystectomy  02/21/2011    Procedure: LAPAROSCOPIC CHOLECYSTECTOMY WITH INTRAOPERATIVE CHOLANGIOGRAM;  Surgeon: Pedro Earls, MD;  Location: [facility];  Service: General;  Laterality: N/A;    ROS:  As stated in the HPI and negative for all other systems.  PHYSICAL EXAM There were no vitals taken for this visit. GENERAL:  Chronically ill appearing but in no acute distress HEENT:  Pupils equal round and reactive, fundi not visualized, oral mucosa unremarkable, dentures NECK:  No jugular venous distention, waveform within normal limits, carotid upstroke brisk and symmetric, left carotid bruits, no thyromegaly LUNGS:  Clear to auscultation bilaterally CHEST:  Well healed sternotomy scar. HEART:  PMI not displaced or sustained,S1 and S2 within normal limits, no S3, no S4, no clicks, no rubs, soft apical, right upper sterna border early peaking murmur ABD:  Flat, positive bowel sounds normal in frequency in pitch, no bruits, no rebound, no guarding, no midline pulsatile mass, no hepatomegaly, no splenomegaly, healed abdominal scars. EXT:  2 plus pulses throughout, moderate bilateral edema, no cyanosis no  clubbing    ASSESSMENT AND PLAN   CAD:  The patient has no new sypmtoms.  No further cardiovascular testing is indicated.  We will continue with aggressive  risk reduction and meds as listed.  ATRIAL FIBRILLATION:    He will continue with the meds as listed.  The patient  tolerates this rhythm and rate control and anticoagulation. We will continue with the meds as listed.  HTN: His BP now seems to be OK.  He will continue on the current meds.    EDEMA:  This is improved.  No change in therapy is indicated.

## 2013-07-24 NOTE — Patient Instructions (Signed)
The current medical regimen is effective;  continue present plan and medications.  Follow up in 3 months with Dr Hochrein.  You will receive a letter in the mail 2 months before you are due.  Please call us when you receive this letter to schedule your follow up appointment.   

## 2013-07-29 ENCOUNTER — Ambulatory Visit (INDEPENDENT_AMBULATORY_CARE_PROVIDER_SITE_OTHER): Payer: Medicare Other | Admitting: Internal Medicine

## 2013-07-29 ENCOUNTER — Encounter: Payer: Self-pay | Admitting: Internal Medicine

## 2013-07-29 VITALS — BP 130/76 | HR 75 | Ht 62.0 in | Wt 158.0 lb

## 2013-07-29 DIAGNOSIS — J449 Chronic obstructive pulmonary disease, unspecified: Secondary | ICD-10-CM

## 2013-07-29 DIAGNOSIS — Z2911 Encounter for prophylactic immunotherapy for respiratory syncytial virus (RSV): Secondary | ICD-10-CM

## 2013-07-29 DIAGNOSIS — Z23 Encounter for immunization: Secondary | ICD-10-CM

## 2013-07-29 DIAGNOSIS — J3089 Other allergic rhinitis: Secondary | ICD-10-CM

## 2013-07-29 DIAGNOSIS — J302 Other seasonal allergic rhinitis: Secondary | ICD-10-CM

## 2013-07-29 DIAGNOSIS — J309 Allergic rhinitis, unspecified: Secondary | ICD-10-CM

## 2013-07-29 DIAGNOSIS — G4733 Obstructive sleep apnea (adult) (pediatric): Secondary | ICD-10-CM

## 2013-07-29 MED ORDER — ARMODAFINIL 250 MG PO TABS: ORAL_TABLET | ORAL | Status: DC

## 2013-07-29 NOTE — Assessment & Plan Note (Signed)
He is satisfied to continue allergy vaccine. This is not a bad spring so far

## 2013-07-29 NOTE — Patient Instructions (Signed)
Script printed for [employer] pneumonia vaccine  Order- Office Spirometry

## 2013-07-29 NOTE — Progress Notes (Signed)
Patient ID: [name], male    DOB: 1930-07-08, 80 y.o.   MRN: [id number]  HPI 11/16/10- 47 year old man followed for severe chronic asthma, sleep apnea, allergic rhinitis, complicated by HBP CAD history of atrial fibrillation, GERD Last here May 20, 2010 CPAP 10 all night every night- helps sleep. Asks refill Provigil to help with long drives.  Allergy vaccine GO definitely helps- he is convinced.  He notes no asthma and no prednisone since last November- credits frequent handwashing, avoiding people with colds and the allergy vaccine. No new concerns. Continues cardiac rehab.   03/03/11-  37 year old man followed for severe chronic asthma, sleep apnea, allergic rhinitis, complicated by HBP CAD history of atrial fibrillation, GERD. Wife here. He recently had his fourth hospital stay since March, for gallbladder pain, cholecystectomy, cardiac stent. Cholecystectomy was 2 weeks ago. Through these he had shortness of breath and cough which has persisted and keep him awake. His nose runs.  07/03/11- 33 year old man followed for severe chronic asthma, sleep apnea, allergic rhinitis, complicated by HBP, CAD, history of atrial fibrillation, GERD. Wife here. Since last here has been to urgent care in emergency room 4 times for exacerbations of asthma. On prednisone 10 mg/day since February from Dr [name]. Definitely having reflux events and we discussed how that may be the significant issue. Denies sinus infection. Had chest x-rays in January and February which she was told were negative. He asks to try increasing his CPAP pressure because he doesn't feel he is sleeping quite well enough. We discussed this.  07/18/11- 11 year old man followed for severe chronic asthma, sleep apnea, allergic rhinitis, complicated by HBP, CAD, history of atrial fibrillation, GERD. Wife here. Seen by Dr Daub-Increased SOB and wheezing; was put on prednisone to help keep patient out of hospital; still not feeling  much better. Has been on prednisone 60 mg daily for past 4 days but still significant shortness of breath with exertion and wheeze. No excess 2 chest x-rays have been clear and EKG "knot heart". Using home nebulizer with DuoNeb 4 times daily plus his rescue inhaler at least twice per day. Nothing seems infected. He has not recognized heartburn although that has been an issue in the past. Denies chest pain, fever, ankle edema or palpitation. Some nasal congestion without much sneezing. Has been indoors without much exposure to pollen.  08/08/11- 44 year old man followed for severe chronic asthma, sleep apnea, allergic rhinitis, complicated by HBP, CAD, history of atrial fibrillation, GERD. Wife here. Hospitalized April 11-17 with acute exacerbation of his chronic fixed asthma. Anxiety and reflux are thought to be important. He says he is "doing great" now. He feels an anxiety medication would help and we discussed this. Currently on prednisone 20 mg daily.  10/04/11-  22 year old man followed for severe chronic asthma, sleep apnea, allergic rhinitis, complicated by HBP, CAD, history of atrial fibrillation, GERD. Wife here  Pt states increase of sob,wheezing,fatigue since getting out of hospital . No hospitalizations since last here. He stays short of breath especially with exertional dyspnea but is comfortable sitting and when lying in bed. His wife points out he is able to do yard work using a [employer]. We reviewed Dr.Hochrein's cardiology note. Question, , each of his dyspnea is pulmonary versus cardiac. We were asked to draw labs at this visit. He was criticized for using his nebulizer machine too frequently before his hospitalization but has only used it a total of 7 times since then. He had remained on maintenance prednisone  since January. His wife gradually tapered it off so he has had none in the last 3 weeks. He continues, and wants to continue, allergy vaccine at 1:10, well tolerated. CXR 07/22/11-    reviewed with them: Findings:  Grossly unchanged enlarged cardiac silhouette and mediastinal  contours post median sternotomy and CABG. Atherosclerotic  calcification within the thoracic aorta. There is persistent mild  elevation of the right hemidiaphragm. Grossly unchanged bibasilar  heterogeneous opacities favored to represent atelectasis. Grossly  unchanged bones including mild compression deformity of a lower  thoracic vertebral body.  IMPRESSION:  No acute cardiopulmonary disease.  Original Report Authenticated By: Rachel Moulds, M.D.  Office Spirometry: FEV1 1.40/58%, FVC 2.44/78%, FEV1/FVC 0.57/73%, FEF 25-75% 0.56/23%.  01/08/12- 89 year old man followed for severe chronic asthma, sleep apnea, allergic rhinitis, complicated by HBP, CAD, history of atrial fibrillation, GERD. Wife here Needs refill for rescue inhaler as he is leaving for beach in am; breathing is doing pretty well; only one treatment since seeing [place] last. . Asks for an antibiotic to carry.  05/10/2012 Acute OV Complains of increased dyspnea , coughing and wheezing. This woke him up this morning and used his neb without relief then used his inhaler without any relief.  Dry cough and wheezing started last pm.  Patient denies any hemoptysis, orthopnea, PND, leg swelling, or calf pain. Patient reports that wheezes. Started 2-3 days ago and worsened. Last night. Prior to going to bed. He had started on prednisone earlier in the week. at 10 mg and is currently on 5 mg daily .  He had his rescue inhaler this am with some relief.  05/30/12- 36 year old man followed for severe chronic asthma, sleep apnea, allergic rhinitis, complicated by HBP, CAD, history of atrial fibrillation, GERD. Wife here He says he is a well now, off prednisone x2 weeks. Likes Mucinex DM. We discussed steroidsand I suggested bone density assessment. We discussed steroid sparing availability of Daliresp trial is frequency of bronchitic exacerbations.   He continues using CPAP 12/ [employer] with no concerns.  09/05/12- 5 year old man followed for severe chronic asthma, sleep apnea, allergic rhinitis, complicated by HBP, CAD, history of atrial fibrillation, GERD.     Wife here FOLLOWS FOR: had to go to UC on 08-26-12 due to breathing issues. Was given shot, neb tx, and pred taper. Had caught a cold while fishing, leading to exacerbation. Finish prednisone taper 2 days ago. Feels "great" now. Allergy vaccine 1:10 GO CPAP 12/[facility] with good compliance and control CXR 08/26/12 IMPRESSION:  No active disease.  Original Report Authenticated By: Vallery Ridge, M.D.  11/04/16 -20 year old man followed for severe chronic asthma, sleep apnea, allergic rhinitis, complicated by HBP, CAD, history of atrial fibrillation/ pacemaker , GERD.     Wife here( She has mitochondrial myopathy) FOLOWS FOR: reports has been having diff w wheezing denies any chest tightness-- states he has been  seen in [place] clinic every month for breathing and per that MD patient may need some medication readjustment   wheeze and shortness of breath, sometimes green mucus. Gets better with neb been steroid but then relapses each time. Uses home nebulizer 3 times daily but doesn't last. Blamed Advair for 2 episodes of pneumonia-discussed. They feel he is doing well with allergy vaccine 1: 10 GO. OSA- good compliance and control with CPAP 12/ [facility]..  03/12/13- 76 year old man followed for severe chronic asthma, sleep apnea, allergic rhinitis, complicated by HBP, CAD, history of atrial fibrillation/ pacemaker , GERD.     (Wife  has mitochondrial myopathy) FOLLOWS FOR: since being on Dulera 100 has not had any flare ups or had to use nebulizer machine. Recent hospital stay at [facility] for heart issues- AFib/ tachycardia.  Feels "nervous, chilly" but not sick. Little cough or wheeze. Has been able to cut way down on nebulizer and w/o need recent prednisone. Ankles swell. Note med list shows  both diltiazem and Norvasc.  07/29/13- 64 year old M former smoker(120 pk yr) followed for severe chronic asthma/ COPD, OSA/CPAP, allergic rhinitis, complicated by HBP, CAD, history of AFib/ pacemaker , GERD.     (Wife  has mitochondrial myopathy) Allergy vaccine 1:10 GO     CPAP 12/ [facility]    FOLLOWS FOR: has been in hospital 3 times since Dec 2014. Pt has had flare ups with COPD. Breathing now is at baseline. Going to gym for exercise twice weekly. He likes [employer] Would still like to keep a few Nuvigil tablets around for long drives- discussed. He asks about updating pneumonia vaccine. He is at very high risk for pneumonia and complications. We compared Pneumovax-23 with Prevnar 13 and guidelines. CXR 06/14/13 IMPRESSION:  Findings concerning for right perihilar and infrahilar pneumonia.  Resolution of the previously seen left pneumonia.  Electronically Signed  By: Rolm Baptise M.D.  On: 06/14/2013 13:51 Office spirometry 07/29/2013- moderate obstructive airways disease-FVC 2.50/81%, FEV1 1.62/68%, FEV1/FVC 0.65, FEF 25-75% 0.90/39%.    ROS-see HPI Constitutional:   No-   weight loss, night sweats, fevers, chills, fatigue, lassitude. HEENT:   No-  headaches, difficulty swallowing, tooth/dental problems, sore throat,       No-  sneezing, itching, ear ache, nasal congestion, post nasal drip,  CV:  No-   chest pain, orthopnea, PND, swelling in lower extremities, anasarca, dizziness, palpitations Resp: +shortness of breath with exertion or at rest.              No-   productive cough,  No non-productive cough,  No- coughing up of blood.              No-change in color of mucus.  No- wheezing.   Skin: No-   rash or lesions. GI:  No-   heartburn, indigestion, abdominal pain, nausea, vomiting,  GU:  MS:  No-   joint pain or swelling.   Neuro-     nothing unusual Psych:  No- change in mood or affect. No depression or anxiety.  No memory loss.  Objective:  OBJ- Physical Exam General- Alert,  Oriented, Affect-appropriate, Distress- none acute Skin- rash-none, lesions- none, excoriation- none Lymphadenopathy- none Head- atraumatic            Eyes- Gross vision intact, PERRLA, conjunctivae and secretions clear            Ears- Hearing aid            Nose- Clear, no-Septal dev, mucus, polyps, erosion, perforation             Throat- Mallampati III , mucosa clear , drainage- none, tonsils- atrophic Neck- flexible , trachea midline, no stridor , thyroid nl, carotid no bruit Chest - symmetrical excursion , unlabored           Heart/CV- RRR , no murmur , no gallop  , no rub, nl s1 s2                           - JVD- none , edema+1-2, stasis changes- none, varices- none  Lung- Clear, wheeze- none, unlabored, cough- none , dullness-none, rub- none           Chest wall- Hyperinflated barrel chest Abd-  Br/ Gen/ Rectal- Not done, not indicated Extrem- cyanosis- none, clubbing, none, atrophy- none, strength- nl Neuro- grossly intact to observation. No tremor

## 2013-07-29 NOTE — Assessment & Plan Note (Signed)
Here main successful with good compliance and control. Pressure is comfortable. Rarely needs help with daytime somnolence and has used fractions of a Nuvigil tablet only for long drives

## 2013-07-29 NOTE — Assessment & Plan Note (Addendum)
Difficult winter with several hospital admissions, now back to baseline Plan-medication talk, continue Dulera, Prevnar pneumococcal vaccine

## 2013-07-30 ENCOUNTER — Ambulatory Visit: Payer: Medicare Other | Admitting: Physical Therapy

## 2013-07-30 DIAGNOSIS — IMO0001 Reserved for inherently not codable concepts without codable children: Secondary | ICD-10-CM | POA: Diagnosis not present

## 2013-08-06 ENCOUNTER — Other Ambulatory Visit: Payer: Self-pay | Admitting: Cardiology

## 2013-08-06 ENCOUNTER — Ambulatory Visit: Payer: Medicare Other | Admitting: Physical Therapy

## 2013-08-06 DIAGNOSIS — IMO0001 Reserved for inherently not codable concepts without codable children: Secondary | ICD-10-CM | POA: Diagnosis not present

## 2013-08-12 ENCOUNTER — Other Ambulatory Visit: Payer: Self-pay | Admitting: Cardiology

## 2013-08-13 ENCOUNTER — Encounter: Payer: Self-pay | Admitting: Internal Medicine

## 2013-08-13 ENCOUNTER — Ambulatory Visit: Payer: Medicare Other | Attending: Internal Medicine | Admitting: Physical Therapy

## 2013-08-13 ENCOUNTER — Ambulatory Visit (INDEPENDENT_AMBULATORY_CARE_PROVIDER_SITE_OTHER): Payer: Medicare Other | Admitting: Internal Medicine

## 2013-08-13 ENCOUNTER — Ambulatory Visit: Payer: Medicare Other | Admitting: Internal Medicine

## 2013-08-13 VITALS — BP 132/78 | HR 60 | Temp 98.6°F | Wt 155.8 lb

## 2013-08-13 DIAGNOSIS — I4891 Unspecified atrial fibrillation: Secondary | ICD-10-CM

## 2013-08-13 DIAGNOSIS — H8110 Benign paroxysmal vertigo, unspecified ear: Secondary | ICD-10-CM | POA: Diagnosis not present

## 2013-08-13 DIAGNOSIS — J449 Chronic obstructive pulmonary disease, unspecified: Secondary | ICD-10-CM

## 2013-08-13 DIAGNOSIS — R269 Unspecified abnormalities of gait and mobility: Secondary | ICD-10-CM | POA: Diagnosis not present

## 2013-08-13 DIAGNOSIS — F411 Generalized anxiety disorder: Secondary | ICD-10-CM | POA: Insufficient documentation

## 2013-08-13 DIAGNOSIS — IMO0001 Reserved for inherently not codable concepts without codable children: Secondary | ICD-10-CM | POA: Insufficient documentation

## 2013-08-13 DIAGNOSIS — I10 Essential (primary) hypertension: Secondary | ICD-10-CM

## 2013-08-13 MED ORDER — ISOSORB DINITRATE-HYDRALAZINE 20-37.5 MG PO TABS
1.0000 | ORAL_TABLET | Freq: Three times a day (TID) | ORAL | Status: DC
Start: 2013-08-13 — End: 2014-04-22

## 2013-08-13 MED ORDER — LORAZEPAM 0.5 MG PO TABS: 0.5000 mg | ORAL_TABLET | Freq: Three times a day (TID) | ORAL | Status: DC

## 2013-08-13 NOTE — Assessment & Plan Note (Signed)
Labile and uncontrolled Exacerbated by anxiety Also exacerbation with hosp for rapid A. Fib 02/2013: Amlodipine discontinued in place of diltiazem 04/2012 hosp for RVR Reviewed episode of bradycardia during 02/2013 hospitalization and cards visit 03/2013 - off Dig and dec Dilt because of same Bidil started hospitalization 06/2012 -titrate up now to TID also on Clonidine 0.2mg  twice daily, max ARB, Zebeta Stopped lasix because of itching -but tolerating torsemide  patient and wife will monitor at home for mgmt of same -  educated on goal: 140s/80s - to check BP only 2x/day! Also address anxiety component - see next  BP Readings from Last 3 Encounters:  08/13/13 132/78  07/29/13 130/76  07/24/13 130/68

## 2013-08-13 NOTE — Patient Instructions (Signed)
It was good to see you today.  We have reviewed your prior records including labs and tests today  Medications reviewed and updated Increase BiDil to 1 pill 3x/day Also increase lorazepam to 1 pill 3x/day No other changes recommended at this time.  Your prescription(s) have been submitted to your pharmacy. Please take as directed and contact our office if you believe you are having problem(s) with the medication(s).  Check your blood pressure TWICE daily - no more  Please schedule followup in 3-4 months, call sooner if problems.

## 2013-08-13 NOTE — Assessment & Plan Note (Signed)
Chronic but stable symptoms Follows with pulmonary for same, no medication changes recommended 

## 2013-08-13 NOTE — Progress Notes (Signed)
Subjective:    Patient ID: Stephen Door., male    DOB: 1930/04/16, 80 y.o.   MRN: [id number]  Hypertension Associated symptoms include shortness of breath (chronic). Pertinent negatives include no chest pain or headaches.    Patient here for follow up Reviewed chronic medical issues and interval medical events  Past Medical History  Diagnosis Date  . CORONARY HEART DISEASE     a. s/p CABG;  b.  cath 06/27/10: S-Dx occluded, S-PDA 80-90% (tx with PCI); S-OM ok, L-LAD ok;  EF 65-70%  c.  s/p Promus DES to S-PDA 06/2010;   d. Myoview 8/12: low risk  . FIBRILLATION, ATRIAL     post op; ?documented during hosp. 06/2010  . SLEEP APNEA 09/2001    NPSG AHI 22/HR  . ALLERGIC RHINITIS   . ASTHMA   . Depression   . GERD     with HH, hx esophageal stricture  . DYSLIPIDEMIA   . HYPERTENSION     Echo 3/12: EF 55-60%; mod LVH; mild AS/AI; LAE; PASP 38; mild pulmo HTN  . Personal history of alcoholism   . Diverticulosis   . Benign liver cyst   . Skin cancer     L forearm  . Cataract     surgery to both eyes  . Esophageal stricture   . Hiatal hernia   . Pneumonia 04/2013 hosp    Review of Systems  Constitutional: Positive for fatigue.  Respiratory: Positive for shortness of breath (chronic). Negative for wheezing.   Cardiovascular: Negative for chest pain and leg swelling.  Neurological: Negative for dizziness, facial asymmetry and headaches.  Psychiatric/Behavioral: Positive for dysphoric mood. Negative for suicidal ideas, confusion, sleep disturbance and self-injury. The patient is nervous/anxious.        Objective:   Physical Exam  BP 132/78  Pulse 60  Temp(Src) 98.6 F (37 C) (Oral)  Wt 155 lb 12.8 oz (70.67 kg)  SpO2 95% Wt Readings from Last 3 Encounters:  08/13/13 155 lb 12.8 oz (70.67 kg)  07/29/13 158 lb (71.668 kg)  07/24/13 158 lb (71.668 kg)   Constitutional: he appears well-developed and well-nourished. No distress. wife at side Neck: Normal range of  motion. Neck supple. No JVD present. No thyromegaly present.  Cardiovascular: Normal rate, regular rhythm and normal heart sounds.  No murmur heard. No BLE edema. Pulmonary/Chest: Effort normal and breath sounds diminished movement bilaterally- No respiratory distress. he has no wheezes.  Psychiatric: he has an anxious but baseline pleasant mood and affect. His behavior is normal. Judgment and thought content normal.   Lab Results  Component Value Date   WBC 16.3* 06/14/2013   HGB 13.8 06/14/2013   HCT 39.8 06/14/2013   PLT 180 06/14/2013   GLUCOSE 172* 06/17/2013   CHOL 123 10/16/2012   TRIG 111.0 10/16/2012   HDL 41.10 10/16/2012   LDLCALC 60 10/16/2012   ALT 20 06/14/2013   AST 20 06/14/2013   NA 144 06/17/2013   K 4.3 06/17/2013   CL 106 06/17/2013   CREATININE 0.99 06/17/2013   BUN 40* 06/17/2013   CO2 25 06/17/2013   TSH 1.170 03/09/2013   PSA 0.62 10/10/2010   INR 1.19 05/04/2013   HGBA1C 6.4 10/16/2012    Dg Chest 2 View  06/14/2013   CLINICAL DATA:  Shortness of breath, cough.  EXAM: CHEST  2 VIEW  COMPARISON:  05/04/2013  FINDINGS: Interval resolution of the previously seen left lung consolidation. New area of right perihilar and infrahilar  opacity/ infiltrate concerning for pneumonia. No effusions. Cardiomegaly. Prior CABG. No acute bony abnormality.  IMPRESSION: Findings concerning for right perihilar and infrahilar pneumonia.  Resolution of the previously seen left pneumonia.   Electronically Signed   By: Rolm Baptise M.D.   On: 06/14/2013 13:51       Assessment & Plan:   Problem List Items Addressed This Visit   Asthma with COPD     Chronic but stable symptoms Follows with pulmonary for same, no medication changes recommended    Atrial fibrillation with RVR     hosp for same 02/2013, 03/2013 and 06/2013- reviewed highly symptomatic RVR: fatigue and dyspnea (but improved with normalized HR) Would consider tykosin if needed as other med options limitied (cards consult reviewed) reviewed  current dilt dose + Zebeta - avoid other beta-blocker due to adv pulm disease Briefly on dig 0.125 prn HR>100 (given lack of other med choices), but stopped due to bradycardia conitnue follow up closely with cards - continue anticoag - eliquis - in place of prior plavix +[id number] (since 03/10/13 DC) Echo 03/09/13: normal LV fx, no cor pulmonale; CT angio neg for PE and no hypoxia By report patient maintaining SR     Relevant Medications      isosorbide-hydrALAZINE (BIDIL) 20-37.5 MG per tablet   Generalized anxiety disorder     Chronic symptoms, exacerbated by fear of uncontrolled blood pressure and symptomatic dyspnea On scheduled benzodiazepine, titrate up to 3 times a day at this time, may also help blood pressure management    HYPERTENSION - Primary      Labile and uncontrolled Exacerbated by anxiety Also exacerbation with hosp for rapid A. Fib 02/2013: Amlodipine discontinued in place of diltiazem 04/2012 hosp for RVR Reviewed episode of bradycardia during 02/2013 hospitalization and cards visit 03/2013 - off Dig and dec Dilt because of same Bidil started hospitalization 06/2012 -titrate up now to TID also on Clonidine 0.2mg  twice daily, max ARB, Zebeta Stopped lasix because of itching -but tolerating torsemide  patient and wife will monitor at home for mgmt of same -  educated on goal: 140s/80s - to check BP only 2x/day! Also address anxiety component - see next  BP Readings from Last 3 Encounters:  08/13/13 132/78  07/29/13 130/76  07/24/13 130/68      Relevant Medications      isosorbide-hydrALAZINE (BIDIL) 20-37.5 MG per tablet

## 2013-08-13 NOTE — Progress Notes (Signed)
Pre visit review using our clinic review tool, if applicable. No additional management support is needed unless otherwise documented below in the visit note. 

## 2013-08-13 NOTE — Progress Notes (Deleted)
k

## 2013-08-13 NOTE — Assessment & Plan Note (Signed)
Chronic symptoms, exacerbated by fear of uncontrolled blood pressure and symptomatic dyspnea On scheduled benzodiazepine, titrate up to 3 times a day at this time, may also help blood pressure management

## 2013-08-13 NOTE — Assessment & Plan Note (Signed)
hosp for same 02/2013, 03/2013 and 06/2013- reviewed highly symptomatic RVR: fatigue and dyspnea (but improved with normalized HR) Would consider tykosin if needed as other med options limitied (cards consult reviewed) reviewed current dilt dose + Zebeta - avoid other beta-blocker due to adv pulm disease Briefly on dig 0.125 prn HR>100 (given lack of other med choices), but stopped due to bradycardia conitnue follow up closely with cards - continue anticoag - eliquis - in place of prior plavix +[id number] (since 03/10/13 DC) Echo 03/09/13: normal LV fx, no cor pulmonale; CT angio neg for PE and no hypoxia By report patient maintaining SR 

## 2013-09-03 ENCOUNTER — Other Ambulatory Visit: Payer: Self-pay | Admitting: Internal Medicine

## 2013-09-09 ENCOUNTER — Other Ambulatory Visit: Payer: Self-pay | Admitting: Internal Medicine

## 2013-09-15 ENCOUNTER — Other Ambulatory Visit: Payer: Self-pay | Admitting: Cardiology

## 2013-09-24 ENCOUNTER — Telehealth: Payer: Self-pay | Admitting: Cardiology

## 2013-09-24 NOTE — Telephone Encounter (Signed)
New message     Patient's heart rate is now 40.  This morning when he woke up it was 50.  Patient is feeling listless.  Please triage.

## 2013-09-24 NOTE — Telephone Encounter (Signed)
Spoke with wife who states pt has been fasting fatigued, weak and some nausea.  He reports his HR is down in the 40's  BP is good at 118/57.  In review of medications wife states they were not able to get his Diltiazem RX filled because it was too early.  She said she had some left over from an old RX so she had been giving him 1 and 1/2 tablets to make the dose he was supposed to be on.  According to out chart pt is to take 120 mg BID (reduced on 06/06/2013) for bradycardia.  Wife states pharmacy has been filling the 180's and that is what he has been taking.  Called pharmacist and RX corrected.  Wife is aware and will d/c the 180 mg and start back on the 120 mg in the am.  They will call back with further needs or concerns.

## 2013-10-06 ENCOUNTER — Ambulatory Visit: Payer: Medicare Other | Admitting: Internal Medicine

## 2013-10-07 ENCOUNTER — Telehealth: Payer: Self-pay | Admitting: Internal Medicine

## 2013-10-07 NOTE — Telephone Encounter (Signed)
lmomtcb x1 for [name]

## 2013-10-08 MED ORDER — IPRATROPIUM-ALBUTEROL 0.5-2.5 (3) MG/3ML IN SOLN: 3.0000 mL | Freq: Four times a day (QID) | RESPIRATORY_TRACT | Status: DC | PRN

## 2013-10-08 NOTE — Telephone Encounter (Signed)
Pt's spouse returned call & can be reached at [phone].  [name]

## 2013-10-08 NOTE — Telephone Encounter (Signed)
[id number]. Quavon Keisling, CMA  

## 2013-10-08 NOTE — Telephone Encounter (Signed)
ATC x 2 no answer Continuous ring WCB

## 2013-10-08 NOTE — Telephone Encounter (Signed)
I have given Rx to [place] to fax to [place]. I attempted to call patients wife and unable to reach her and no way of leaving a VM. Will forward to Triage to help with trying to call patient/wife to let her know this has been taken care of.

## 2013-10-08 NOTE — Telephone Encounter (Signed)
Spoke with Rod Holler [facility]) States that pt is needing refills of neb med Ipratropium Bromide/Albuterol Sulfate  This has been printed and placed on CY cart to sign.  Needs to be faxed to [place]   Will send to [facility] to follow up on

## 2013-10-09 NOTE — Telephone Encounter (Signed)
Pt advised. Zane Samson, CMA  

## 2013-10-21 ENCOUNTER — Encounter: Payer: Self-pay | Admitting: Internal Medicine

## 2013-10-21 ENCOUNTER — Ambulatory Visit (INDEPENDENT_AMBULATORY_CARE_PROVIDER_SITE_OTHER): Payer: Medicare Other | Admitting: Internal Medicine

## 2013-10-21 VITALS — BP 140/60 | HR 62 | Temp 98.7°F | Ht 62.0 in | Wt 152.5 lb

## 2013-10-21 DIAGNOSIS — D509 Iron deficiency anemia, unspecified: Secondary | ICD-10-CM

## 2013-10-21 DIAGNOSIS — M810 Age-related osteoporosis without current pathological fracture: Secondary | ICD-10-CM

## 2013-10-21 DIAGNOSIS — I5032 Chronic diastolic (congestive) heart failure: Secondary | ICD-10-CM

## 2013-10-21 DIAGNOSIS — J449 Chronic obstructive pulmonary disease, unspecified: Secondary | ICD-10-CM

## 2013-10-21 DIAGNOSIS — E785 Hyperlipidemia, unspecified: Secondary | ICD-10-CM

## 2013-10-21 NOTE — Assessment & Plan Note (Signed)
Exacerbated by chronic anticoagulation -Plavix + ASA, changes to Eliquis 03/2013 GI eval 04/2013: felt patient too high risk for repeat endoscopic evaluation at this time monitor serial CBC Ok to DC oral iron if ferritin and CBC ok Also on PPI (prev H2B before 04/2013 GI eval) Iron/TIBC/Ferritin    Component Value Date/Time   IRON 37* 04/21/2013 0957   IRON 37* 04/21/2013 0957   FERRITIN 17.1* 04/21/2013 0957

## 2013-10-21 NOTE — Assessment & Plan Note (Signed)
Currently euvolemic - exacerbated by pulm dz and history of RVR Changed Lasix to demadex 04/2013 due to ?lasix allergy (itch) Continue loop diuretic as ongoing, checking BMet today to monitor

## 2013-10-21 NOTE — Progress Notes (Signed)
Pre visit review using our clinic review tool, if applicable. No additional management support is needed unless otherwise documented below in the visit note. 

## 2013-10-21 NOTE — Assessment & Plan Note (Signed)
Chronic but stable symptoms Follows with pulmonary for same, no medication changes recommended

## 2013-10-21 NOTE — Patient Instructions (Signed)
It was good to see you today.  We have reviewed your prior records including labs and tests today  Test(s) ordered today. Return when when you are fasting. Your results will be released to [place] (or called to you) after review, usually within 72hours after test completion. If any changes need to be made, you will be notified at that same time.  Medications reviewed and updated, no changes recommended at this time..  Please schedule followup in 6 months, call sooner if problems.

## 2013-10-21 NOTE — Assessment & Plan Note (Signed)
No hx fracture, freq pred use due to COPD Discussed med options for tx 05/2012 -  given hx esophageal stricture, recommended to begin Prolia injections -  Enrolled with patient support program -  1st dose done 05/16/13, repeat q 20mo Continue WB exercise activity and take Ca+D BID Education on same provided at length today

## 2013-10-21 NOTE — Assessment & Plan Note (Signed)
On statin for overlapping CAD Check lipids annually and titrate as needed follow up with cards on same 

## 2013-10-21 NOTE — Progress Notes (Signed)
Subjective:    Patient ID: Stephen Door., male    DOB: 1930-12-30, 80 y.o.   MRN: [id number]  HPI  Patient here for follow up Reviewed chronic medical issues and interval medical events  Past Medical History  Diagnosis Date  . CORONARY HEART DISEASE     a. s/p CABG;  b.  cath 06/27/10: S-Dx occluded, S-PDA 80-90% (tx with PCI); S-OM ok, L-LAD ok;  EF 65-70%  c.  s/p Promus DES to S-PDA 06/2010;   d. Myoview 8/12: low risk  . FIBRILLATION, ATRIAL     post op; ?documented during hosp. 06/2010  . SLEEP APNEA 09/2001    NPSG AHI 22/HR  . ALLERGIC RHINITIS   . ASTHMA   . Depression   . GERD     with HH, hx esophageal stricture  . DYSLIPIDEMIA   . HYPERTENSION     Echo 3/12: EF 55-60%; mod LVH; mild AS/AI; LAE; PASP 38; mild pulmo HTN  . Personal history of alcoholism   . Diverticulosis   . Benign liver cyst   . Skin cancer     L forearm  . Cataract     surgery to both eyes  . Esophageal stricture   . Hiatal hernia   . Pneumonia 04/2013 hosp    Review of Systems  Constitutional: Negative for fever, fatigue and unexpected weight change.  Respiratory: Positive for shortness of breath (chronic w/o change). Negative for cough.   Cardiovascular: Negative for chest pain and leg swelling.       Objective:   Physical Exam  BP 140/60  Pulse 62  Temp(Src) 98.7 F (37.1 C) (Oral)  Ht 5\' 2"  (1.575 m)  Wt 152 lb 8 oz (69.174 kg)  BMI 27.89 kg/m2  SpO2 95% Wt Readings from Last 3 Encounters:  10/21/13 152 lb 8 oz (69.174 kg)  08/13/13 155 lb 12.8 oz (70.67 kg)  07/29/13 158 lb (71.668 kg)   Constitutional: he appears well-developed and well-nourished. No distress. Wife at side  Neck: Normal range of motion. Neck supple. No JVD present. No thyromegaly present.  Cardiovascular: Normal rate, irregular rhythm and normal heart sounds.  No murmur heard. No BLE edema. Pulmonary/Chest: Effort normal and breath sounds diminished at bases. No respiratory distress at rest. he has no  wheezes.  Psychiatric: he has a normal mood and affect. His behavior is normal. Judgment and thought content normal.   Lab Results  Component Value Date   WBC 16.3* 06/14/2013   HGB 13.8 06/14/2013   HCT 39.8 06/14/2013   PLT 180 06/14/2013   GLUCOSE 172* 06/17/2013   CHOL 123 10/16/2012   TRIG 111.0 10/16/2012   HDL 41.10 10/16/2012   LDLCALC 60 10/16/2012   ALT 20 06/14/2013   AST 20 06/14/2013   NA 144 06/17/2013   K 4.3 06/17/2013   CL 106 06/17/2013   CREATININE 0.99 06/17/2013   BUN 40* 06/17/2013   CO2 25 06/17/2013   TSH 1.170 03/09/2013   PSA 0.62 10/10/2010   INR 1.19 05/04/2013   HGBA1C 6.4 10/16/2012    Dg Chest 2 View  06/14/2013   CLINICAL DATA:  Shortness of breath, cough.  EXAM: CHEST  2 VIEW  COMPARISON:  05/04/2013  FINDINGS: Interval resolution of the previously seen left lung consolidation. New area of right perihilar and infrahilar opacity/ infiltrate concerning for pneumonia. No effusions. Cardiomegaly. Prior CABG. No acute bony abnormality.  IMPRESSION: Findings concerning for right perihilar and infrahilar pneumonia.  Resolution of  the previously seen left pneumonia.   Electronically Signed   By: Rolm Baptise M.D.   On: 06/14/2013 13:51       Assessment & Plan:   Problem List Items Addressed This Visit   Anemia, iron deficiency      Exacerbated by chronic anticoagulation -Plavix + ASA, changes to Eliquis 03/2013 GI eval 04/2013: felt patient too high risk for repeat endoscopic evaluation at this time monitor serial CBC Ok to DC oral iron if ferritin and CBC ok Also on PPI (prev H2B before 04/2013 GI eval) Iron/TIBC/Ferritin    Component Value Date/Time   IRON 37* 04/21/2013 0957   IRON 37* 04/21/2013 0957   FERRITIN 17.1* 04/21/2013 0957      Relevant Orders      CBC with Differential      Ferritin   Asthma with COPD     Chronic but stable symptoms Follows with pulmonary for same, no medication changes recommended    Chronic diastolic heart failure     Currently euvolemic  - exacerbated by pulm dz and history of RVR Changed Lasix to demadex 04/2013 due to ?lasix allergy (itch) Continue loop diuretic as ongoing, checking BMet today to monitor    Relevant Orders      Basic metabolic panel   DYSLIPIDEMIA - Primary     On statin for overlapping CAD Check lipids annually and titrate as needed follow up with cards on same    Relevant Orders      Lipid panel   Osteoporosis     No hx fracture, freq pred use due to COPD Discussed med options for tx 05/2012 -  given hx esophageal stricture, recommended to begin Prolia injections -  Enrolled with patient support program -  1st dose done 05/16/13, repeat q 90mo Continue WB exercise activity and take Ca+D BID Education on same provided at length today

## 2013-10-24 ENCOUNTER — Other Ambulatory Visit: Payer: Self-pay | Admitting: *Deleted

## 2013-10-24 ENCOUNTER — Ambulatory Visit (INDEPENDENT_AMBULATORY_CARE_PROVIDER_SITE_OTHER): Payer: Medicare Other | Admitting: Cardiology

## 2013-10-24 ENCOUNTER — Encounter: Payer: Self-pay | Admitting: Cardiology

## 2013-10-24 ENCOUNTER — Other Ambulatory Visit (INDEPENDENT_AMBULATORY_CARE_PROVIDER_SITE_OTHER): Payer: Medicare Other

## 2013-10-24 VITALS — BP 145/67 | HR 58 | Ht 62.0 in | Wt 151.0 lb

## 2013-10-24 DIAGNOSIS — I10 Essential (primary) hypertension: Secondary | ICD-10-CM

## 2013-10-24 DIAGNOSIS — R0989 Other specified symptoms and signs involving the circulatory and respiratory systems: Secondary | ICD-10-CM

## 2013-10-24 DIAGNOSIS — I5032 Chronic diastolic (congestive) heart failure: Secondary | ICD-10-CM

## 2013-10-24 DIAGNOSIS — I4891 Unspecified atrial fibrillation: Secondary | ICD-10-CM

## 2013-10-24 DIAGNOSIS — D509 Iron deficiency anemia, unspecified: Secondary | ICD-10-CM

## 2013-10-24 DIAGNOSIS — E785 Hyperlipidemia, unspecified: Secondary | ICD-10-CM

## 2013-10-24 DIAGNOSIS — I2510 Atherosclerotic heart disease of native coronary artery without angina pectoris: Secondary | ICD-10-CM

## 2013-10-24 LAB — LIPID PANEL
Cholesterol: 81 mg/dL (ref 0–200)
HDL: 27.8 mg/dL — ABNORMAL LOW (ref >39.00)
LDL CALC: 37 mg/dL (ref 0–99)
NONHDL: 53.2
Total CHOL/HDL Ratio: 3
Triglycerides: 80 mg/dL (ref 0.0–149.0)
VLDL: 16 mg/dL (ref 0.0–40.0)

## 2013-10-24 LAB — BASIC METABOLIC PANEL
BUN: 17 mg/dL (ref 6–23)
CALCIUM: 8.8 mg/dL (ref 8.4–10.5)
CHLORIDE: 102 meq/L (ref 96–112)
CO2: 29 mEq/L (ref 19–32)
CREATININE: 0.9 mg/dL (ref 0.4–1.5)
GFR: 87.27 mL/min (ref >60.00)
Glucose, Bld: 110 mg/dL — ABNORMAL HIGH (ref 70–99)
Potassium: 4.1 mEq/L (ref 3.5–5.1)
Sodium: 135 mEq/L (ref 135–145)

## 2013-10-24 LAB — CBC WITH DIFFERENTIAL/PLATELET
BASOS ABS: 0 10*3/uL (ref 0.0–0.1)
Basophils Relative: 0.5 % (ref 0.0–3.0)
Eosinophils Absolute: 0.1 10*3/uL (ref 0.0–0.7)
Eosinophils Relative: 1.8 % (ref 0.0–5.0)
HCT: 37.7 % — ABNORMAL LOW (ref 39.0–52.0)
Hemoglobin: 12.6 g/dL — ABNORMAL LOW (ref 13.0–17.0)
LYMPHS ABS: 2.1 10*3/uL (ref 0.7–4.0)
Lymphocytes Relative: 27.5 % (ref 12.0–46.0)
MCHC: 33.4 g/dL (ref 30.0–36.0)
MCV: 90 fl (ref 78.0–100.0)
Monocytes Absolute: 0.6 10*3/uL (ref 0.1–1.0)
Monocytes Relative: 8.2 % (ref 3.0–12.0)
Neutro Abs: 4.6 10*3/uL (ref 1.4–7.7)
Neutrophils Relative %: 62 % (ref 43.0–77.0)
Platelets: 196 10*3/uL (ref 150.0–400.0)
RBC: 4.19 Mil/uL — AB (ref 4.22–5.81)
RDW: 14.3 % (ref 11.5–15.5)
WBC: 7.5 10*3/uL (ref 4.0–10.5)

## 2013-10-24 LAB — FERRITIN: Ferritin: 84.2 ng/mL (ref 22.0–322.0)

## 2013-10-24 MED ORDER — DILTIAZEM HCL ER COATED BEADS 120 MG PO CP24: 120.0000 mg | ORAL_CAPSULE | Freq: Two times a day (BID) | ORAL | Status: DC

## 2013-10-24 NOTE — Patient Instructions (Signed)
Your physician recommends that you schedule a follow-up appointment in: 6 months with Dr. Hochrein  

## 2013-10-24 NOTE — Progress Notes (Signed)
HPI The patient presents for follow up of  CAD, s/p CABG, s/p Promus DES to S-PDA in 06/2010.   He was admitted in Nov of last with with tachycardia and AFib.  He was placed on Eliquis.   Since I last saw him he has done well.  The patient denies any new symptoms such as chest discomfort, neck or arm discomfort. There has been no new shortness of breath, PND or orthopnea. There have been no reported palpitations, presyncope or syncope.  He is having edema in his legs over a couple of days.   He's had no new shortness of breath, PND or orthopnea. He has had no new chest pressure, neck or arm discomfort. He has had no weight gain.    Allergies  Allergen Reactions  . Ace Inhibitors     SEVERE ASTHMA (COPD)  . Codeine Nausea And Vomiting  . Lasix [Furosemide] Itching    Current Outpatient Prescriptions  Medication Sig Dispense Refill  . acidophilus (RISAQUAD) CAPS Take 1 capsule by mouth daily. [facility] probiotics      . albuterol (PROVENTIL HFA;VENTOLIN HFA) 108 (90 BASE) MCG/ACT inhaler Inhale into the lungs every 6 (six) hours as needed for wheezing or shortness of breath.      Marland Kitchen apixaban (ELIQUIS) 5 MG TABS tablet Take 5 mg by mouth 2 (two) times daily.      . Armodafinil (NUVIGIL) 250 MG tablet Use 1/2- 1 tab daily if needed  5 tablet  1  . bisoprolol (ZEBETA) 10 MG tablet take 1 tablet by mouth once daily  30 tablet  5  . budesonide (PULMICORT) 0.25 MG/2ML nebulizer solution Take 0.25 mg by nebulization 2 (two) times daily as needed. For asthma      . calcium-vitamin D (OSCAL WITH D) 500-200 MG-UNIT per tablet Take 1 tablet by mouth 2 (two) times daily.      . cloNIDine (CATAPRES) 0.3 MG tablet Take 1 tablet (0.3 mg total) by mouth 2 (two) times daily.  60 tablet  11  . denosumab (PROLIA) 60 MG/ML SOLN injection Inject 60 mg into the skin every 6 (six) months. Administer in upper arm, thigh, or abdomen  1 mL  5  . diltiazem (CARDIZEM CD) 120 MG 24 hr capsule Take 1 capsule (120 mg total)  by mouth 2 (two) times daily.  60 capsule  11  . ferrous sulfate 325 (65 FE) MG tablet take 1 tablet by mouth three times a day with meals  90 tablet  0  . ipratropium-albuterol (DUONEB) 0.5-2.5 (3) MG/3ML SOLN Take 3 mLs by nebulization every 6 (six) hours as needed (for shortness of breath).  360 mL  6  . isosorbide-hydrALAZINE (BIDIL) 20-37.5 MG per tablet Take 1 tablet by mouth 3 (three) times daily.  90 tablet  5  . latanoprost (XALATAN) 0.005 % ophthalmic solution Place 1 drop into both eyes at bedtime.       Marland Kitchen LORazepam (ATIVAN) 0.5 MG tablet Take 1 tablet (0.5 mg total) by mouth 3 (three) times daily.  90 tablet  5  . losartan (COZAAR) 100 MG tablet take 1 tablet by mouth once daily  30 tablet  6  . mometasone-formoterol (DULERA) 100-5 MCG/ACT AERO Inhale 2 puffs into the lungs 2 (two) times daily.      . montelukast (SINGULAIR) 10 MG tablet Take 10 mg by mouth at bedtime.      . nitroGLYCERIN (NITROSTAT) 0.4 MG SL tablet Place 0.4 mg under the tongue every 5 (  five) minutes as needed for chest pain.      Marland Kitchen omeprazole (PRILOSEC) 20 MG capsule Take 1 capsule (20 mg total) by mouth daily.  60 capsule  6  . potassium chloride SA (K-DUR,KLOR-CON) 20 MEQ tablet Take 20 mEq by mouth 2 (two) times daily.      . pravastatin (PRAVACHOL) 80 MG tablet take 1 tablet by mouth every evening  30 tablet  3  . promethazine (PHENERGAN) 25 MG tablet Take 1 tablet (25 mg total) by mouth every 6 (six) hours as needed for nausea or vomiting.  30 tablet  0  . RA VITAMIN B-12 TR 1000 MCG TBCR take 1 tablet by mouth once daily  30 tablet  5  . torsemide (DEMADEX) 20 MG tablet take 1 tablet by mouth once daily  30 tablet  5   No current facility-administered medications for this visit.    Past Medical History  Diagnosis Date  . CORONARY HEART DISEASE     a. s/p CABG;  b.  cath 06/27/10: S-Dx occluded, S-PDA 80-90% (tx with PCI); S-OM ok, L-LAD ok;  EF 65-70%  c.  s/p Promus DES to S-PDA 06/2010;   d. Myoview 8/12:  low risk  . FIBRILLATION, ATRIAL     post op; ?documented during hosp. 06/2010  . SLEEP APNEA 09/2001    NPSG AHI 22/HR  . ALLERGIC RHINITIS   . ASTHMA   . Depression   . GERD     with HH, hx esophageal stricture  . DYSLIPIDEMIA   . HYPERTENSION     Echo 3/12: EF 55-60%; mod LVH; mild AS/AI; LAE; PASP 38; mild pulmo HTN  . Personal history of alcoholism   . Diverticulosis   . Benign liver cyst   . Skin cancer     L forearm  . Cataract     surgery to both eyes  . Esophageal stricture   . Hiatal hernia   . Pneumonia 04/2013 hosp    Past Surgical History  Procedure Laterality Date  . Coronary artery bypass graft  02/2007  . Hernia repair  12/03/07  . Tonsillectomy    . Coronary angioplasty with stent placement  07/2010  . Cataract extraction, bilateral  2007  . Hernia repair  unsure ?60's  . Cholecystectomy  02/21/2011    Procedure: LAPAROSCOPIC CHOLECYSTECTOMY WITH INTRAOPERATIVE CHOLANGIOGRAM;  Surgeon: Pedro Earls, MD;  Location: [facility];  Service: General;  Laterality: N/A;    ROS:  As stated in the HPI and negative for all other systems.  PHYSICAL EXAM BP 145/67  Pulse 58  Ht 5\' 2"  (1.575 m)  Wt 151 lb (68.493 kg)  BMI 27.61 kg/m2 GENERAL:  Chronically ill appearing but in no acute distress HEENT:  Pupils equal round and reactive, fundi not visualized, oral mucosa unremarkable, dentures NECK:  No jugular venous distention, waveform within normal limits, carotid upstroke brisk and symmetric, left carotid bruits, no thyromegaly LUNGS:  Clear to auscultation bilaterally CHEST:  Well healed sternotomy scar. HEART:  PMI not displaced or sustained,S1 and S2 within normal limits, no S3, no S4, no clicks, no rubs, soft apical, right upper sterna border early peaking murmur ABD:  Flat, positive bowel sounds normal in frequency in pitch, no bruits, no rebound, no guarding, no midline pulsatile mass, no hepatomegaly, no splenomegaly, healed abdominal scars. EXT:  2 plus  pulses throughout, mild bilateral edema, no cyanosis no clubbing  EKG:  Sinus rhythm, rate 58, first degree AV block, right bundle branch  block incomplete, no acute ST-T wave changes.  10/24/2013  ASSESSMENT AND PLAN  CAD:  The patient has no new sypmtoms.  No further cardiovascular testing is indicated.  We will continue with aggressive risk reduction and meds as listed.  ATRIAL FIBRILLATION:    He will continue with the meds as listed.   He has no Paroxysmal symptoms.  HTN: The blood pressure is at target. No change in medications is indicated. We will continue with therapeutic lifestyle changes (TLC).  EDEMA:  He does have some increased edema today and will take a couple of days of Lasix.  CAROTID STENOSIS:  I reviewed the results from last year he had 40-59% bilateral stenosis with some progression. I will arrange for followup.

## 2013-10-27 ENCOUNTER — Telehealth: Payer: Self-pay | Admitting: Internal Medicine

## 2013-10-27 NOTE — Telephone Encounter (Signed)
Labs are not released yet...informed pt that as soon as MD has reviewed I will call with the results.

## 2013-10-27 NOTE — Telephone Encounter (Signed)
Patient is requesting follow up call in regards to recent labs

## 2013-10-29 ENCOUNTER — Telehealth: Payer: Self-pay

## 2013-10-29 ENCOUNTER — Ambulatory Visit (HOSPITAL_COMMUNITY)
Admission: RE | Admit: 2013-10-29 | Discharge: 2013-10-29 | Disposition: A | Discharge status: Home / Self Care | Payer: Medicare Other | Source: Ambulatory Visit | Attending: Internal Medicine | Admitting: Internal Medicine

## 2013-10-29 DIAGNOSIS — R0989 Other specified symptoms and signs involving the circulatory and respiratory systems: Secondary | ICD-10-CM

## 2013-10-29 DIAGNOSIS — I6529 Occlusion and stenosis of unspecified carotid artery: Secondary | ICD-10-CM

## 2013-10-29 NOTE — Telephone Encounter (Signed)
Pt informed of results.

## 2013-10-29 NOTE — Progress Notes (Signed)
Carotid Duplex Completed. Teegan Guinther, BS, RDMS, RVT  

## 2013-11-08 ENCOUNTER — Other Ambulatory Visit: Payer: Self-pay | Admitting: Internal Medicine

## 2013-11-10 ENCOUNTER — Other Ambulatory Visit: Payer: Self-pay

## 2013-11-10 MED ORDER — MONTELUKAST SODIUM 10 MG PO TABS: 10.0000 mg | ORAL_TABLET | Freq: Every day | ORAL | Status: DC

## 2013-11-11 ENCOUNTER — Other Ambulatory Visit: Payer: Self-pay

## 2013-11-11 MED ORDER — APIXABAN 5 MG PO TABS: 5.0000 mg | ORAL_TABLET | Freq: Two times a day (BID) | ORAL | Status: DC

## 2013-11-11 NOTE — Progress Notes (Signed)
Pt informed of his carotid doppler results

## 2013-11-14 ENCOUNTER — Ambulatory Visit (INDEPENDENT_AMBULATORY_CARE_PROVIDER_SITE_OTHER): Payer: Medicare Other

## 2013-11-14 DIAGNOSIS — J309 Allergic rhinitis, unspecified: Secondary | ICD-10-CM

## 2013-11-25 ENCOUNTER — Ambulatory Visit (INDEPENDENT_AMBULATORY_CARE_PROVIDER_SITE_OTHER): Payer: Medicare Other | Admitting: Internal Medicine

## 2013-11-25 ENCOUNTER — Encounter: Payer: Self-pay | Admitting: Internal Medicine

## 2013-11-25 VITALS — BP 136/80 | HR 56 | Ht 62.0 in | Wt 152.4 lb

## 2013-11-25 DIAGNOSIS — J449 Chronic obstructive pulmonary disease, unspecified: Secondary | ICD-10-CM

## 2013-11-25 NOTE — Patient Instructions (Signed)
Try- Sample Breo Ellipta 1 puff then rinse mouth, once daily  Try -Then try sample Symbicort 160  2 puffs then rinse mouth, twice daily  Try - Then go back to Sample Dulera 100   2 puffs then rinse mouth, twice daily

## 2013-11-25 NOTE — Progress Notes (Signed)
Patient ID: [name], male    DOB: 1930-07-08, 80 y.o.   MRN: [id number]  HPI 11/16/10- 47 year old man followed for severe chronic asthma, sleep apnea, allergic rhinitis, complicated by HBP CAD history of atrial fibrillation, GERD Last here May 20, 2010 CPAP 10 all night every night- helps sleep. Asks refill Provigil to help with long drives.  Allergy vaccine GO definitely helps- he is convinced.  He notes no asthma and no prednisone since last November- credits frequent handwashing, avoiding people with colds and the allergy vaccine. No new concerns. Continues cardiac rehab.   03/03/11-  37 year old man followed for severe chronic asthma, sleep apnea, allergic rhinitis, complicated by HBP CAD history of atrial fibrillation, GERD. Wife here. He recently had his fourth hospital stay since March, for gallbladder pain, cholecystectomy, cardiac stent. Cholecystectomy was 2 weeks ago. Through these he had shortness of breath and cough which has persisted and keep him awake. His nose runs.  07/03/11- 33 year old man followed for severe chronic asthma, sleep apnea, allergic rhinitis, complicated by HBP, CAD, history of atrial fibrillation, GERD. Wife here. Since last here has been to urgent care in emergency room 4 times for exacerbations of asthma. On prednisone 10 mg/day since February from Dr [name]. Definitely having reflux events and we discussed how that may be the significant issue. Denies sinus infection. Had chest x-rays in January and February which she was told were negative. He asks to try increasing his CPAP pressure because he doesn't feel he is sleeping quite well enough. We discussed this.  07/18/11- 11 year old man followed for severe chronic asthma, sleep apnea, allergic rhinitis, complicated by HBP, CAD, history of atrial fibrillation, GERD. Wife here. Seen by Dr Daub-Increased SOB and wheezing; was put on prednisone to help keep patient out of hospital; still not feeling  much better. Has been on prednisone 60 mg daily for past 4 days but still significant shortness of breath with exertion and wheeze. No excess 2 chest x-rays have been clear and EKG "knot heart". Using home nebulizer with DuoNeb 4 times daily plus his rescue inhaler at least twice per day. Nothing seems infected. He has not recognized heartburn although that has been an issue in the past. Denies chest pain, fever, ankle edema or palpitation. Some nasal congestion without much sneezing. Has been indoors without much exposure to pollen.  08/08/11- 44 year old man followed for severe chronic asthma, sleep apnea, allergic rhinitis, complicated by HBP, CAD, history of atrial fibrillation, GERD. Wife here. Hospitalized April 11-17 with acute exacerbation of his chronic fixed asthma. Anxiety and reflux are thought to be important. He says he is "doing great" now. He feels an anxiety medication would help and we discussed this. Currently on prednisone 20 mg daily.  10/04/11-  22 year old man followed for severe chronic asthma, sleep apnea, allergic rhinitis, complicated by HBP, CAD, history of atrial fibrillation, GERD. Wife here  Pt states increase of sob,wheezing,fatigue since getting out of hospital . No hospitalizations since last here. He stays short of breath especially with exertional dyspnea but is comfortable sitting and when lying in bed. His wife points out he is able to do yard work using a [employer]. We reviewed Dr.Hochrein's cardiology note. Question, , each of his dyspnea is pulmonary versus cardiac. We were asked to draw labs at this visit. He was criticized for using his nebulizer machine too frequently before his hospitalization but has only used it a total of 7 times since then. He had remained on maintenance prednisone  since January. His wife gradually tapered it off so he has had none in the last 3 weeks. He continues, and wants to continue, allergy vaccine at 1:10, well tolerated. CXR 07/22/11-    reviewed with them: Findings:  Grossly unchanged enlarged cardiac silhouette and mediastinal  contours post median sternotomy and CABG. Atherosclerotic  calcification within the thoracic aorta. There is persistent mild  elevation of the right hemidiaphragm. Grossly unchanged bibasilar  heterogeneous opacities favored to represent atelectasis. Grossly  unchanged bones including mild compression deformity of a lower  thoracic vertebral body.  IMPRESSION:  No acute cardiopulmonary disease.  Original Report Authenticated By: Rachel Moulds, M.D.  Office Spirometry: FEV1 1.40/58%, FVC 2.44/78%, FEV1/FVC 0.57/73%, FEF 25-75% 0.56/23%.  01/08/12- 89 year old man followed for severe chronic asthma, sleep apnea, allergic rhinitis, complicated by HBP, CAD, history of atrial fibrillation, GERD. Wife here Needs refill for rescue inhaler as he is leaving for beach in am; breathing is doing pretty well; only one treatment since seeing [place] last. . Asks for an antibiotic to carry.  05/10/2012 Acute OV Complains of increased dyspnea , coughing and wheezing. This woke him up this morning and used his neb without relief then used his inhaler without any relief.  Dry cough and wheezing started last pm.  Patient denies any hemoptysis, orthopnea, PND, leg swelling, or calf pain. Patient reports that wheezes. Started 2-3 days ago and worsened. Last night. Prior to going to bed. He had started on prednisone earlier in the week. at 10 mg and is currently on 5 mg daily .  He had his rescue inhaler this am with some relief.  05/30/12- 36 year old man followed for severe chronic asthma, sleep apnea, allergic rhinitis, complicated by HBP, CAD, history of atrial fibrillation, GERD. Wife here He says he is a well now, off prednisone x2 weeks. Likes Mucinex DM. We discussed steroidsand I suggested bone density assessment. We discussed steroid sparing availability of Daliresp trial is frequency of bronchitic exacerbations.   He continues using CPAP 12/ [facility] with no concerns.  09/05/12- 5 year old man followed for severe chronic asthma, sleep apnea, allergic rhinitis, complicated by HBP, CAD, history of atrial fibrillation, GERD.     Wife here FOLLOWS FOR: had to go to UC on 08-26-12 due to breathing issues. Was given shot, neb tx, and pred taper. Had caught a cold while fishing, leading to exacerbation. Finish prednisone taper 2 days ago. Feels "great" now. Allergy vaccine 1:10 GO CPAP 12/[facility] with good compliance and control CXR 08/26/12 IMPRESSION:  No active disease.  Original Report Authenticated By: Vallery Ridge, M.D.  11/04/16 -20 year old man followed for severe chronic asthma, sleep apnea, allergic rhinitis, complicated by HBP, CAD, history of atrial fibrillation/ pacemaker , GERD.     Wife here( She has mitochondrial myopathy) FOLOWS FOR: reports has been having diff w wheezing denies any chest tightness-- states he has been  seen in [place] clinic every month for breathing and per that MD patient may need some medication readjustment   wheeze and shortness of breath, sometimes green mucus. Gets better with neb been steroid but then relapses each time. Uses home nebulizer 3 times daily but doesn't last. Blamed Advair for 2 episodes of pneumonia-discussed. They feel he is doing well with allergy vaccine 1: 10 GO. OSA- good compliance and control with CPAP 12/ [facility]..  03/12/13- 76 year old man followed for severe chronic asthma, sleep apnea, allergic rhinitis, complicated by HBP, CAD, history of atrial fibrillation/ pacemaker , GERD.     (Wife  has mitochondrial myopathy) FOLLOWS FOR: since being on Dulera 100 has not had any flare ups or had to use nebulizer machine. Recent hospital stay at [facility] for heart issues- AFib/ tachycardia.  Feels "nervous, chilly" but not sick. Little cough or wheeze. Has been able to cut way down on nebulizer and w/o need recent prednisone. Ankles swell. Note med list shows  both diltiazem and Norvasc.  07/29/13- 50 year old M former smoker(120 pk yr) followed for severe chronic asthma/ COPD, OSA/CPAP, allergic rhinitis, complicated by HBP, CAD, history of AFib/ pacemaker , GERD.     (Wife  has mitochondrial myopathy) Allergy vaccine 1:10 GO     CPAP 12/ [facility]    FOLLOWS FOR: has been in hospital 3 times since Dec 2014. Pt has had flare ups with COPD. Breathing now is at baseline. Going to gym for exercise twice weekly. He likes [employer] Would still like to keep a few Nuvigil tablets around for long drives- discussed. He asks about updating pneumonia vaccine. He is at very high risk for pneumonia and complications. We compared Pneumovax-23 with Prevnar 13 and guidelines. CXR 06/14/13 IMPRESSION:  Findings concerning for right perihilar and infrahilar pneumonia.  Resolution of the previously seen left pneumonia.  Electronically Signed  By: Rolm Baptise M.D.  On: 06/14/2013 13:51 Office spirometry 07/29/2013- moderate obstructive airways disease-FVC 2.50/81%, FEV1 1.62/68%, FEV1/FVC 0.65, FEF 25-75% 0.90/39%.  11/25/13- 38 year old M former smoker(120 pk yr) followed for severe chronic asthma/ COPD, OSA/CPAP, allergic rhinitis, complicated by HBP, CAD, history of AFib/ pacemaker , GERD.     (Wife  has mitochondrial myopathy) FOLLOWS KGU:[id number] good since on Dulera (in [place]),no cough,no wheezing,sleeps well,CPAP works well Allergy vaccine 1:10 GO     CPAP 12/ [facility]        ROS-see HPI Constitutional:   No-   weight loss, night sweats, fevers, chills, fatigue, lassitude. HEENT:   No-  headaches, difficulty swallowing, tooth/dental problems, sore throat,       No-  sneezing, itching, ear ache, nasal congestion, post nasal drip,  CV:  No-   chest pain, orthopnea, PND, swelling in lower extremities, anasarca, dizziness, palpitations Resp: +shortness of breath with exertion or at rest.              No-   productive cough,  No non-productive cough,  No- coughing up  of blood.              No-change in color of mucus.  No- wheezing.   Skin: No-   rash or lesions. GI:  No-   heartburn, indigestion, abdominal pain, nausea, vomiting,  GU:  MS:  No-   joint pain or swelling.   Neuro-     nothing unusual Psych:  No- change in mood or affect. No depression or anxiety.  No memory loss.  Objective:  OBJ- Physical Exam General- Alert, Oriented, Affect-appropriate, Distress- none acute Skin- rash-none, lesions- none, excoriation- none Lymphadenopathy- none Head- atraumatic            Eyes- Gross vision intact, PERRLA, conjunctivae and secretions clear            Ears- Hearing aid            Nose- Clear, no-Septal dev, mucus, polyps, erosion, perforation             Throat- Mallampati III , mucosa clear , drainage- none, tonsils- atrophic Neck- flexible , trachea midline, no stridor , thyroid nl, carotid no bruit Chest - symmetrical excursion , unlabored  Heart/CV- RRR , no murmur , no gallop  , no rub, nl s1 s2                           - JVD- none , edema+1-2, stasis changes- none, varices- none           Lung- Clear, wheeze- none, unlabored, cough- none , dullness-none, rub- none           Chest wall- Hyperinflated barrel chest Abd-  Br/ Gen/ Rectal- Not done, not indicated Extrem- cyanosis- none, clubbing, none, atrophy- none, strength- nl Neuro- grossly intact to observation. No tremor

## 2013-11-28 ENCOUNTER — Ambulatory Visit: Payer: Medicare Other | Admitting: Internal Medicine

## 2013-12-01 ENCOUNTER — Other Ambulatory Visit: Payer: Self-pay | Admitting: Cardiology

## 2013-12-09 ENCOUNTER — Emergency Department (HOSPITAL_COMMUNITY)
Admission: EM | Admit: 2013-12-09 | Discharge: 2013-12-09 | Disposition: A | Discharge status: Home / Self Care | Payer: Medicare Other | Attending: Emergency Medicine | Admitting: Emergency Medicine

## 2013-12-09 ENCOUNTER — Emergency Department (HOSPITAL_COMMUNITY): Payer: Medicare Other

## 2013-12-09 ENCOUNTER — Encounter (HOSPITAL_COMMUNITY): Payer: Self-pay | Admitting: Emergency Medicine

## 2013-12-09 DIAGNOSIS — E785 Hyperlipidemia, unspecified: Secondary | ICD-10-CM | POA: Insufficient documentation

## 2013-12-09 DIAGNOSIS — Z951 Presence of aortocoronary bypass graft: Secondary | ICD-10-CM | POA: Insufficient documentation

## 2013-12-09 DIAGNOSIS — Z79899 Other long term (current) drug therapy: Secondary | ICD-10-CM | POA: Insufficient documentation

## 2013-12-09 DIAGNOSIS — I4891 Unspecified atrial fibrillation: Secondary | ICD-10-CM | POA: Insufficient documentation

## 2013-12-09 DIAGNOSIS — I2510 Atherosclerotic heart disease of native coronary artery without angina pectoris: Secondary | ICD-10-CM | POA: Insufficient documentation

## 2013-12-09 DIAGNOSIS — Z85828 Personal history of other malignant neoplasm of skin: Secondary | ICD-10-CM | POA: Diagnosis not present

## 2013-12-09 DIAGNOSIS — Z9861 Coronary angioplasty status: Secondary | ICD-10-CM | POA: Insufficient documentation

## 2013-12-09 DIAGNOSIS — R0602 Shortness of breath: Secondary | ICD-10-CM | POA: Insufficient documentation

## 2013-12-09 DIAGNOSIS — Z8659 Personal history of other mental and behavioral disorders: Secondary | ICD-10-CM | POA: Insufficient documentation

## 2013-12-09 DIAGNOSIS — J45901 Unspecified asthma with (acute) exacerbation: Secondary | ICD-10-CM | POA: Diagnosis not present

## 2013-12-09 DIAGNOSIS — Z87891 Personal history of nicotine dependence: Secondary | ICD-10-CM | POA: Diagnosis not present

## 2013-12-09 DIAGNOSIS — Z7902 Long term (current) use of antithrombotics/antiplatelets: Secondary | ICD-10-CM | POA: Diagnosis not present

## 2013-12-09 DIAGNOSIS — J189 Pneumonia, unspecified organism: Secondary | ICD-10-CM

## 2013-12-09 DIAGNOSIS — I10 Essential (primary) hypertension: Secondary | ICD-10-CM | POA: Insufficient documentation

## 2013-12-09 DIAGNOSIS — K219 Gastro-esophageal reflux disease without esophagitis: Secondary | ICD-10-CM | POA: Diagnosis not present

## 2013-12-09 DIAGNOSIS — J159 Unspecified bacterial pneumonia: Secondary | ICD-10-CM | POA: Diagnosis not present

## 2013-12-09 DIAGNOSIS — Z8669 Personal history of other diseases of the nervous system and sense organs: Secondary | ICD-10-CM | POA: Diagnosis not present

## 2013-12-09 LAB — URINALYSIS, ROUTINE W REFLEX MICROSCOPIC
BILIRUBIN URINE: NEGATIVE
Glucose, UA: NEGATIVE mg/dL
HGB URINE DIPSTICK: NEGATIVE
KETONES UR: NEGATIVE mg/dL
Leukocytes, UA: NEGATIVE
NITRITE: NEGATIVE
PROTEIN: NEGATIVE mg/dL
Specific Gravity, Urine: 1.01 (ref 1.005–1.030)
UROBILINOGEN UA: 0.2 mg/dL (ref 0.0–1.0)
pH: 6 (ref 5.0–8.0)

## 2013-12-09 LAB — CBC WITH DIFFERENTIAL/PLATELET
Basophils Absolute: 0 10*3/uL (ref 0.0–0.1)
Basophils Relative: 0 % (ref 0–1)
EOS ABS: 0 10*3/uL (ref 0.0–0.7)
EOS PCT: 0 % (ref 0–5)
HEMATOCRIT: 37.6 % — AB (ref 39.0–52.0)
Hemoglobin: 12.7 g/dL — ABNORMAL LOW (ref 13.0–17.0)
LYMPHS ABS: 0.8 10*3/uL (ref 0.7–4.0)
Lymphocytes Relative: 7 % — ABNORMAL LOW (ref 12–46)
MCH: 30.5 pg (ref 26.0–34.0)
MCHC: 33.8 g/dL (ref 30.0–36.0)
MCV: 90.4 fL (ref 78.0–100.0)
MONO ABS: 1 10*3/uL (ref 0.1–1.0)
Monocytes Relative: 9 % (ref 3–12)
Neutro Abs: 10 10*3/uL — ABNORMAL HIGH (ref 1.7–7.7)
Neutrophils Relative %: 84 % — ABNORMAL HIGH (ref 43–77)
Platelets: 158 10*3/uL (ref 150–400)
RBC: 4.16 MIL/uL — AB (ref 4.22–5.81)
RDW: 14.5 % (ref 11.5–15.5)
WBC: 11.8 10*3/uL — AB (ref 4.0–10.5)

## 2013-12-09 LAB — COMPREHENSIVE METABOLIC PANEL
ALK PHOS: 59 U/L (ref 39–117)
ALT: 15 U/L (ref 0–53)
ANION GAP: 13 (ref 5–15)
AST: 15 U/L (ref 0–37)
Albumin: 3.7 g/dL (ref 3.5–5.2)
BILIRUBIN TOTAL: 0.6 mg/dL (ref 0.3–1.2)
BUN: 19 mg/dL (ref 6–23)
CHLORIDE: 101 meq/L (ref 96–112)
CO2: 25 mEq/L (ref 19–32)
Calcium: 8.9 mg/dL (ref 8.4–10.5)
Creatinine, Ser: 1.07 mg/dL (ref 0.50–1.35)
GFR calc non Af Amer: 64 mL/min — ABNORMAL LOW (ref >90)
GFR, EST AFRICAN AMERICAN: 74 mL/min — AB (ref >90)
GLUCOSE: 111 mg/dL — AB (ref 70–99)
Potassium: 4.1 mEq/L (ref 3.7–5.3)
Sodium: 139 mEq/L (ref 137–147)
Total Protein: 6.1 g/dL (ref 6.0–8.3)

## 2013-12-09 LAB — LACTIC ACID, PLASMA: Lactic Acid, Venous: 1.3 mmol/L (ref 0.5–2.2)

## 2013-12-09 LAB — TROPONIN I

## 2013-12-09 MED ORDER — IOHEXOL 350 MG/ML SOLN
100.0000 mL | Freq: Once | INTRAVENOUS | Status: AC | PRN
  Administered 2013-12-09: 100 mL via INTRAVENOUS

## 2013-12-09 MED ORDER — LEVOFLOXACIN 500 MG PO TABS: 500.0000 mg | ORAL_TABLET | Freq: Every day | ORAL | Status: DC

## 2013-12-09 MED ORDER — IPRATROPIUM BROMIDE 0.02 % IN SOLN
0.5000 mg | Freq: Once | RESPIRATORY_TRACT | Status: AC
  Administered 2013-12-09: 0.5 mg via RESPIRATORY_TRACT
  Filled 2013-12-09: qty 2.5

## 2013-12-09 MED ORDER — ACETAMINOPHEN 325 MG PO TABS
325.0000 mg | ORAL_TABLET | Freq: Once | ORAL | Status: AC
  Administered 2013-12-09: 325 mg via ORAL
  Filled 2013-12-09: qty 1

## 2013-12-09 MED ORDER — LEVOFLOXACIN 500 MG PO TABS
500.0000 mg | ORAL_TABLET | Freq: Once | ORAL | Status: AC
  Administered 2013-12-09: 500 mg via ORAL
  Filled 2013-12-09 (×2): qty 1

## 2013-12-09 MED ORDER — ALBUTEROL SULFATE (2.5 MG/3ML) 0.083% IN NEBU
5.0000 mg | INHALATION_SOLUTION | Freq: Once | RESPIRATORY_TRACT | Status: AC
  Administered 2013-12-09: 5 mg via RESPIRATORY_TRACT
  Filled 2013-12-09: qty 6

## 2013-12-09 NOTE — ED Notes (Signed)
Patient transported to X-ray 

## 2013-12-09 NOTE — ED Notes (Addendum)
Per EMS pt c/o of SOB since 0530 this morning. Pt has hx of asthma, COPD, pneumonia (06/2013). Pt sts he thinks he is getting pneumonia again. Pt received 10 mg albuterol. Lung clear post albuterol per EMS. Pt on 2L 02 98%, 118/58. Pt reports having chills, and pt sts he took 324 aspirin at home prior to EMS arrival. Pt temp 102 oral.

## 2013-12-09 NOTE — Discharge Instructions (Signed)

## 2013-12-09 NOTE — ED Provider Notes (Signed)
CSN: [id number]     Arrival date & time 12/09/13  3762 History   First MD Initiated Contact with Patient 12/09/13 [phone]     Chief Complaint  Patient presents with  . Shortness of Breath      HPI Patient presents emergency department with complaints of fevers and chills and some shortness of breath today.  He also reports a pleuritic right-sided chest pain.  He said  Cough over the past several days.  He's concerned about the possibility of pneumonia.  Denies urinary symptoms.  No abdominal pain.  Denies nausea vomiting.  No diarrhea.  No back pain.  No anterior chest discomfort.  Denies headache.  Family reports no altered mental status.   Past Medical History  Diagnosis Date  . CORONARY HEART DISEASE     a. s/p CABG;  b.  cath 06/27/10: S-Dx occluded, S-PDA 80-90% (tx with PCI); S-OM ok, L-LAD ok;  EF 65-70%  c.  s/p Promus DES to S-PDA 06/2010;   d. Myoview 8/12: low risk  . FIBRILLATION, ATRIAL     post op; ?documented during hosp. 06/2010  . SLEEP APNEA 09/2001    NPSG AHI 22/HR  . ALLERGIC RHINITIS   . ASTHMA   . Depression   . GERD     with HH, hx esophageal stricture  . DYSLIPIDEMIA   . HYPERTENSION     Echo 3/12: EF 55-60%; mod LVH; mild AS/AI; LAE; PASP 38; mild pulmo HTN  . Personal history of alcoholism   . Diverticulosis   . Benign liver cyst   . Skin cancer     L forearm  . Cataract     surgery to both eyes  . Esophageal stricture   . Hiatal hernia   . Pneumonia 04/2013 hosp   Past Surgical History  Procedure Laterality Date  . Coronary artery bypass graft  02/2007  . Hernia repair  12/03/07  . Tonsillectomy    . Coronary angioplasty with stent placement  07/2010  . Cataract extraction, bilateral  2007  . Hernia repair  unsure ?60's  . Cholecystectomy  02/21/2011    Procedure: LAPAROSCOPIC CHOLECYSTECTOMY WITH INTRAOPERATIVE CHOLANGIOGRAM;  Surgeon: Pedro Earls, MD;  Location: [facility];  Service: General;  Laterality: N/A;   Family History  Problem  Relation Age of Onset  . COPD Sister   . Asthma Sister   . Emphysema Sister   . Hypertension Sister   . Hypertension Mother   . Colon cancer Neg Hx   . Heart disease Brother   . Heart disease Mother   . Heart disease Father    History  Substance Use Topics  . Smoking status: Former Smoker -- 3.00 packs/day for 40 years    Types: Cigarettes    Quit date: 04/11/1979  . Smokeless tobacco: Never Used  . Alcohol Use: No    Review of Systems  All other systems reviewed and are negative.     Allergies  Ace inhibitors; Codeine; and Lasix  Home Medications   Prior to Admission medications   Medication Sig Start Date End Date Taking? Authorizing Provider  acidophilus (RISAQUAD) CAPS Take 1 capsule by mouth daily. [facility] probiotics   Yes Historical Provider, MD  albuterol (PROVENTIL HFA;VENTOLIN HFA) 108 (90 BASE) MCG/ACT inhaler Inhale 2 puffs into the lungs every 6 (six) hours as needed for wheezing or shortness of breath.    Yes Historical Provider, MD  apixaban (ELIQUIS) 5 MG TABS tablet Take 1 tablet (5 mg total) by  mouth 2 (two) times daily. 11/11/13  Yes Minus Breeding, MD  bisoprolol (ZEBETA) 10 MG tablet Take 10 mg by mouth daily.   Yes Historical Provider, MD  budesonide (PULMICORT) 0.25 MG/2ML nebulizer solution Take 0.25 mg by nebulization 2 (two) times daily as needed. For asthma   Yes Historical Provider, MD  calcium-vitamin D (OSCAL WITH D) 500-200 MG-UNIT per tablet Take 1 tablet by mouth 2 (two) times daily.   Yes Historical Provider, MD  cloNIDine (CATAPRES) 0.3 MG tablet Take 1 tablet (0.3 mg total) by mouth 2 (two) times daily. 07/24/13  Yes Minus Breeding, MD  denosumab (PROLIA) 60 MG/ML SOLN injection Inject 60 mg into the skin every 6 (six) months. Administer in upper arm, thigh, or abdomen 06/12/12  Yes Rowe Clack, MD  diltiazem (CARDIZEM CD) 120 MG 24 hr capsule Take 1 capsule (120 mg total) by mouth 2 (two) times daily. 10/24/13  Yes Minus Breeding, MD   ferrous sulfate 325 (65 FE) MG tablet Take 325 mg by mouth 2 (two) times daily.   Yes Historical Provider, MD  ipratropium-albuterol (DUONEB) 0.5-2.5 (3) MG/3ML SOLN Take 3 mLs by nebulization every 6 (six) hours as needed (for shortness of breath). 10/08/13  Yes Deneise Lever, MD  isosorbide-hydrALAZINE (BIDIL) 20-37.5 MG per tablet Take 1 tablet by mouth 3 (three) times daily. 08/13/13  Yes Rowe Clack, MD  latanoprost (XALATAN) 0.005 % ophthalmic solution Place 1 drop into both eyes at bedtime.    Yes Historical Provider, MD  LORazepam (ATIVAN) 0.5 MG tablet Take 0.5 mg by mouth 2 (two) times daily.   Yes Historical Provider, MD  losartan (COZAAR) 100 MG tablet take 1 tablet by mouth once daily    Minus Breeding, MD  mometasone-formoterol (DULERA) 100-5 MCG/ACT AERO Inhale 2 puffs into the lungs 2 (two) times daily.    Historical Provider, MD  montelukast (SINGULAIR) 10 MG tablet take 1 tablet by mouth at bedtime    Rowe Clack, MD  nitroGLYCERIN (NITROSTAT) 0.4 MG SL tablet Place 0.4 mg under the tongue every 5 (five) minutes as needed for chest pain.    Historical Provider, MD  omeprazole (PRILOSEC) 20 MG capsule Take 1 capsule (20 mg total) by mouth daily. 04/29/13   Irene Shipper, MD  potassium chloride SA (K-DUR,KLOR-CON) 20 MEQ tablet Take 20 mEq by mouth 2 (two) times daily.    Historical Provider, MD  pravastatin (PRAVACHOL) 80 MG tablet take 1 tablet by mouth every evening 08/06/13   Minus Breeding, MD  promethazine (PHENERGAN) 25 MG tablet Take 1 tablet (25 mg total) by mouth every 6 (six) hours as needed for nausea or vomiting. 03/12/13   Rowe Clack, MD  RA VITAMIN B-12 TR 1000 MCG TBCR take 1 tablet by mouth once daily 07/18/13   Rowe Clack, MD  torsemide (DEMADEX) 20 MG tablet take 1 tablet by mouth once daily 07/18/13   Rowe Clack, MD   BP 150/47  Pulse 91  Temp(Src) 102 F (38.9 C) (Oral)  Resp 20  SpO2 97% Physical Exam  Nursing note and  vitals reviewed. Constitutional: He is oriented to person, place, and time. He appears well-developed and well-nourished.  HENT:  Head: Normocephalic and atraumatic.  Eyes: EOM are normal.  Neck: Normal range of motion.  Cardiovascular: Normal rate, regular rhythm, normal heart sounds and intact distal pulses.   Pulmonary/Chest: Effort normal and breath sounds normal. No respiratory distress. He exhibits no tenderness.  Abdominal: Soft.  He exhibits no distension. There is no tenderness. There is no rebound and no guarding.  Musculoskeletal: Normal range of motion.  Neurological: He is alert and oriented to person, place, and time.  Skin: Skin is warm and dry. No rash noted.  Psychiatric: He has a normal mood and affect. Judgment normal.    ED Course  Procedures (including critical care time) Labs Review Labs Reviewed  CBC WITH DIFFERENTIAL - Abnormal; Notable for the following:    WBC 11.8 (*)    RBC 4.16 (*)    Hemoglobin 12.7 (*)    HCT 37.6 (*)    Neutrophils Relative % 84 (*)    Neutro Abs 10.0 (*)    Lymphocytes Relative 7 (*)    All other components within normal limits  COMPREHENSIVE METABOLIC PANEL - Abnormal; Notable for the following:    Glucose, Bld 111 (*)    GFR calc non Af Amer 64 (*)    GFR calc Af Amer 74 (*)    All other components within normal limits  CULTURE, BLOOD (ROUTINE X 2)  CULTURE, BLOOD (ROUTINE X 2)  TROPONIN I  LACTIC ACID, PLASMA  URINALYSIS, ROUTINE W REFLEX MICROSCOPIC    Imaging Review Dg Chest 2 View  12/09/2013   CLINICAL DATA:  Shortness of breath.  Chest pain on the right.  EXAM: CHEST  2 VIEW  COMPARISON:  PA and lateral chest 06/14/2013 and 03/14/2013. CT chest 03/09/2013.  FINDINGS: The patient is status post CABG. Minimal basilar atelectasis is identified. The lungs are otherwise clear. Heart size is normal. No pneumothorax or pleural effusion.  IMPRESSION: No acute disease.   Electronically Signed   By: Inge Rise M.D.   On:  12/09/2013 11:54   Ct Angio Chest W/cm &/or Wo Cm  12/09/2013   CLINICAL DATA:  Shortness of breath.  EXAM: CT ANGIOGRAPHY CHEST WITH CONTRAST  TECHNIQUE: Multidetector CT imaging of the chest was performed using the standard protocol during bolus administration of intravenous contrast. Multiplanar CT image reconstructions and MIPs were obtained to evaluate the vascular anatomy.  CONTRAST:  160mL OMNIPAQUE IOHEXOL 350 MG/ML SOLN  COMPARISON:  12/09/2013.  FINDINGS: Thoracic aortic atherosclerotic vascular disease. Pulmonary arteries are normal. Cardiomegaly. Coronary artery disease. Prior CABG.  No significant mediastinal adenopathy. Sliding hiatal hernia with dilated esophagus. Esophagus contains fluid. Gastroesophageal reflux could present in this fashion. To evaluate for distal obstructing lesion brain swallow should be considered.  Large airways are patent. Right lower lobe infiltrate consistent with pneumonia.  Multiple hepatic lucencies are noted consistent with simple cysts. Diffuse degenerative changes thoracic spine.  No acute bony abnormality identified. Sclerotic density noted left upper posterior rib, unchanged.  Review of the MIP images confirms the above findings.  IMPRESSION: 1. Right lower lobe infiltrate consistent with pneumonia. 2. Sliding hiatal hernia with dilated thoracic esophagus. Thoracic esophagus contains fluid. Gastroesophageal reflux can present in this fashion. To evaluate for obstructing lesion, barium swallow should be considered. 3. Prior CABG.  Cardiomegaly.  No evidence of pulmonary embolus.   Electronically Signed   By: Marcello Moores  Register   On: 12/09/2013 13:25     EKG Interpretation   Date/Time:  Tuesday December 09 2013 09:32:14 EDT Ventricular Rate:  91 PR Interval:  182 QRS Duration: 101 QT Interval:  360 QTC Calculation: 443 R Axis:   37 Text Interpretation:  Sinus rhythm RSR' in V1 or V2, probably normal  variant Probable anteroseptal infarct, old No  significant change was found  Confirmed by  Keianna Signer  MD, Lennette Bihari ([id number]) on 12/09/2013 10:32:09 AM      MDM   Final diagnoses:  None    Initial x-ray without abnormality.  CT angiogram performed to evaluate lung parenchyma further as well as to evaluate for the possibility of pulmonary embolism.  CT scan demonstrates right lower lobe pneumonia.  He also does have a hiatal hernia.  I do not think this is actually causing symptoms.  I think his right-sided pleuritic pain involves pleurisy from his pneumonia.  Clinically the patient has pneumonia.  He does have fever here.  Blood cultures were obtained.  The patient's pneumonia severity index makes him risk class III. the patient ambulated in the emergency department.  He is at his baseline oxygen saturation of around 89%.  No increased work of breathing.  He feels good.  He would like to go home.  Patient and family agreeable to outpatient plan.  Home with Levaquin.    Hoy Morn, MD 12/09/13 [phone]

## 2013-12-12 ENCOUNTER — Ambulatory Visit (INDEPENDENT_AMBULATORY_CARE_PROVIDER_SITE_OTHER): Payer: Medicare Other | Admitting: Internal Medicine

## 2013-12-12 ENCOUNTER — Encounter: Payer: Self-pay | Admitting: Internal Medicine

## 2013-12-12 VITALS — BP 120/56 | HR 61 | Temp 98.4°F | Resp 14 | Wt 146.4 lb

## 2013-12-12 DIAGNOSIS — J189 Pneumonia, unspecified organism: Secondary | ICD-10-CM

## 2013-12-12 NOTE — Progress Notes (Signed)
Subjective:    Patient ID: Stephen Door., male    DOB: 06/24/1930, 80 y.o.   MRN: [id number]  HPI  He was seen in the emergency room 12/09/13 for fever, chills, shortness of breath, and right pleuritic chest pain. He was found to have a temperature of 102F. White count was 11,800. Chest x-ray surprisingly did not reveal pneumonia but such was found in the right lower lobe on CT angiogram.  He was discharged on levofloxacin. His continued pulmonary toilet.  He has had the Prevnar 13 injection.      Review of Systems  His right-sided pleuritic chest pain has improved over the last 48 hours.  He denies any fever, chills, or sweats  He is not producing any purulent sputum at this time.     Objective:   Physical Exam   Positive or pertinent findings include: He has bilateral hearing aids He is edentulous but only wears upper plate. He has minimal rales in homogenous distribution without increased work of breathing There is a grade 1 systolic murmur with bilateral carotid bruits. There is accentuation of the upper thoracic curve He has a large irregular cyst over the PIP joint of left index finger There is forearm hyperpigmentation due to chronic bruising ; tattoos of the forearms.  General appearance :adequately nourished; in no distress. Eyes: No conjunctival inflammation or scleral icterus is present. Oral exam: Lips and gums are healthy appearing.There is no oropharyngeal erythema or exudate noted.  Heart:  Normal rate and regular rhythm. S1 and S2 normal without gallop,  click, rub or other extra sounds   Abdomen: bowel sounds normal, soft and non-tender without masses, organomegaly or hernias noted.  No guarding or rebound. No flank tenderness to percussion. Skin:Warm & dry.  Intact without suspicious lesions or rashes ; no jaundice or tenting Lymphatic: No lymphadenopathy is noted about the head, neck, axilla            Assessment & Plan:                                                                                   #1 right lower lobe pneumonia associated with pleurisy; by history and exam this is resolving on the fluoroquinolone.  Plan: He will complete the entire seven-day course and report any new or progressive symptoms

## 2013-12-12 NOTE — Patient Instructions (Signed)
Please complete the full course of antibiotics. Report any new or persistent symptoms.

## 2013-12-12 NOTE — Progress Notes (Signed)
Pre visit review using our clinic review tool, if applicable. No additional management support is needed unless otherwise documented below in the visit note. 

## 2013-12-15 LAB — CULTURE, BLOOD (ROUTINE X 2)
CULTURE: NO GROWTH
Culture: NO GROWTH

## 2013-12-16 ENCOUNTER — Other Ambulatory Visit: Payer: Self-pay | Admitting: Cardiology

## 2013-12-24 ENCOUNTER — Telehealth: Payer: Self-pay | Admitting: Cardiology

## 2013-12-24 NOTE — Telephone Encounter (Signed)
Please call,pt heart rate is going up high.It was 80,94 and now again.

## 2013-12-24 NOTE — Telephone Encounter (Signed)
Returned call to patient's wife. She reports that her husband's HR has been up, ranging from 80-94 (which is higher than usual for him)  She also reports elevated BP.   This AM 194/88 172/84 184/88 180/91  193/100  His BP has been consistently high over past few days - this is NOT an acute BP spike that occurred only today.   Patient denies blurred vision, headaches, lightheadedness/dizziness, shortness of breath. He did report he took 1 NTG this AM for chest tightness with relief (which was news to his wife).   Patient would prefer that Dr. Percival Spanish be notified of his complaints and BP/HR issues and informed patient/wife that he will be in the office tomorrow and the patient will be contacted with advice accordingly.

## 2013-12-26 NOTE — Telephone Encounter (Signed)
Message has been sent to Dr. Salome Spotted to advise patient

## 2013-12-26 NOTE — Telephone Encounter (Signed)
Instructed by Mickel Baas, NP to increase bisoprolol from 10mg  daily to 15mg  daily. Instructed to take BP/HR twice daily at least and give [place] update on Monday. Call if HR under 50  Patient wrote down instructions and voiced understanding.

## 2013-12-26 NOTE — Telephone Encounter (Signed)
Pt wife called again,says she still not heard anything.

## 2013-12-29 ENCOUNTER — Telehealth: Payer: Self-pay | Admitting: Cardiology

## 2013-12-29 NOTE — Telephone Encounter (Signed)
Returned call to patient's wife she stated husband's B/P has been elevated in the mornings when he first gets up for the past couple of weeks.B/P ranging 154/79,157/83.Pulse 60 bpm.Stated patient taking medication as prescribed.Message sent to [place] for advice.

## 2013-12-29 NOTE — Telephone Encounter (Signed)
Pt's wife called in wanting to update the nurse on his progress . He stated that his BP was 154/79 and HR was 74 when he first woke up this morning. He recorded it again around 10am and his BP was 157/83 and his HR was 60. Please call  Thanks

## 2013-12-30 NOTE — Telephone Encounter (Signed)
He is on multiple meds.  Continue current therapy.  Follow BPs.

## 2013-12-31 ENCOUNTER — Telehealth: Payer: Self-pay | Admitting: Cardiology

## 2013-12-31 MED ORDER — BISOPROLOL FUMARATE 10 MG PO TABS: 15.0000 mg | ORAL_TABLET | Freq: Every day | ORAL | Status: DC

## 2013-12-31 NOTE — Telephone Encounter (Signed)
Spoke with pt wife, aware bisoprolol script is for 15mg  and is at the pharmacy

## 2013-12-31 NOTE — Telephone Encounter (Signed)
Rod Holler called in stating that Dr. Percival Spanish has changed Mr.Ropp's dosage and wanted to make sure when it gets called in for a refill soon that the dosage will be correct.    Thanks

## 2013-12-31 NOTE — Telephone Encounter (Signed)
Pt informed to continue current therapy and monitor BP

## 2014-01-01 ENCOUNTER — Inpatient Hospital Stay (HOSPITAL_COMMUNITY)
Admission: EM | Admit: 2014-01-01 | Discharge: 2014-01-04 | DRG: 194 | Disposition: A | Discharge status: Home / Self Care | Payer: Medicare Other | Attending: Internal Medicine | Admitting: Internal Medicine

## 2014-01-01 ENCOUNTER — Emergency Department (HOSPITAL_COMMUNITY): Payer: Medicare Other

## 2014-01-01 ENCOUNTER — Encounter (HOSPITAL_COMMUNITY): Payer: Self-pay | Admitting: Emergency Medicine

## 2014-01-01 DIAGNOSIS — Z9861 Coronary angioplasty status: Secondary | ICD-10-CM

## 2014-01-01 DIAGNOSIS — F1021 Alcohol dependence, in remission: Secondary | ICD-10-CM

## 2014-01-01 DIAGNOSIS — I517 Cardiomegaly: Secondary | ICD-10-CM | POA: Diagnosis present

## 2014-01-01 DIAGNOSIS — F329 Major depressive disorder, single episode, unspecified: Secondary | ICD-10-CM | POA: Diagnosis present

## 2014-01-01 DIAGNOSIS — Z9849 Cataract extraction status, unspecified eye: Secondary | ICD-10-CM

## 2014-01-01 DIAGNOSIS — D509 Iron deficiency anemia, unspecified: Secondary | ICD-10-CM

## 2014-01-01 DIAGNOSIS — K222 Esophageal obstruction: Secondary | ICD-10-CM | POA: Diagnosis present

## 2014-01-01 DIAGNOSIS — R1313 Dysphagia, pharyngeal phase: Secondary | ICD-10-CM | POA: Diagnosis present

## 2014-01-01 DIAGNOSIS — M810 Age-related osteoporosis without current pathological fracture: Secondary | ICD-10-CM

## 2014-01-01 DIAGNOSIS — I2510 Atherosclerotic heart disease of native coronary artery without angina pectoris: Secondary | ICD-10-CM

## 2014-01-01 DIAGNOSIS — M79672 Pain in left foot: Secondary | ICD-10-CM

## 2014-01-01 DIAGNOSIS — I5031 Acute diastolic (congestive) heart failure: Secondary | ICD-10-CM

## 2014-01-01 DIAGNOSIS — J181 Lobar pneumonia, unspecified organism: Secondary | ICD-10-CM

## 2014-01-01 DIAGNOSIS — G4730 Sleep apnea, unspecified: Secondary | ICD-10-CM | POA: Diagnosis present

## 2014-01-01 DIAGNOSIS — J302 Other seasonal allergic rhinitis: Secondary | ICD-10-CM

## 2014-01-01 DIAGNOSIS — E785 Hyperlipidemia, unspecified: Secondary | ICD-10-CM | POA: Diagnosis present

## 2014-01-01 DIAGNOSIS — Z85828 Personal history of other malignant neoplasm of skin: Secondary | ICD-10-CM

## 2014-01-01 DIAGNOSIS — Z792 Long term (current) use of antibiotics: Secondary | ICD-10-CM | POA: Diagnosis not present

## 2014-01-01 DIAGNOSIS — J3089 Other allergic rhinitis: Secondary | ICD-10-CM

## 2014-01-01 DIAGNOSIS — K219 Gastro-esophageal reflux disease without esophagitis: Secondary | ICD-10-CM | POA: Diagnosis present

## 2014-01-01 DIAGNOSIS — M79671 Pain in right foot: Secondary | ICD-10-CM

## 2014-01-01 DIAGNOSIS — F3289 Other specified depressive episodes: Secondary | ICD-10-CM | POA: Diagnosis present

## 2014-01-01 DIAGNOSIS — J189 Pneumonia, unspecified organism: Secondary | ICD-10-CM | POA: Diagnosis present

## 2014-01-01 DIAGNOSIS — J45901 Unspecified asthma with (acute) exacerbation: Secondary | ICD-10-CM | POA: Diagnosis present

## 2014-01-01 DIAGNOSIS — R1011 Right upper quadrant pain: Secondary | ICD-10-CM | POA: Diagnosis present

## 2014-01-01 DIAGNOSIS — I10 Essential (primary) hypertension: Secondary | ICD-10-CM

## 2014-01-01 DIAGNOSIS — G4733 Obstructive sleep apnea (adult) (pediatric): Secondary | ICD-10-CM

## 2014-01-01 DIAGNOSIS — Z87891 Personal history of nicotine dependence: Secondary | ICD-10-CM | POA: Diagnosis not present

## 2014-01-01 DIAGNOSIS — Z951 Presence of aortocoronary bypass graft: Secondary | ICD-10-CM

## 2014-01-01 DIAGNOSIS — K449 Diaphragmatic hernia without obstruction or gangrene: Secondary | ICD-10-CM | POA: Diagnosis present

## 2014-01-01 DIAGNOSIS — I5032 Chronic diastolic (congestive) heart failure: Secondary | ICD-10-CM

## 2014-01-01 DIAGNOSIS — K7689 Other specified diseases of liver: Secondary | ICD-10-CM | POA: Diagnosis present

## 2014-01-01 DIAGNOSIS — I4891 Unspecified atrial fibrillation: Secondary | ICD-10-CM | POA: Diagnosis present

## 2014-01-01 DIAGNOSIS — J449 Chronic obstructive pulmonary disease, unspecified: Secondary | ICD-10-CM

## 2014-01-01 DIAGNOSIS — F411 Generalized anxiety disorder: Secondary | ICD-10-CM

## 2014-01-01 DIAGNOSIS — R0989 Other specified symptoms and signs involving the circulatory and respiratory systems: Secondary | ICD-10-CM

## 2014-01-01 LAB — URINALYSIS, ROUTINE W REFLEX MICROSCOPIC
Bilirubin Urine: NEGATIVE
Glucose, UA: NEGATIVE mg/dL
HGB URINE DIPSTICK: NEGATIVE
KETONES UR: NEGATIVE mg/dL
Leukocytes, UA: NEGATIVE
NITRITE: NEGATIVE
PROTEIN: NEGATIVE mg/dL
Specific Gravity, Urine: 1.012 (ref 1.005–1.030)
UROBILINOGEN UA: 0.2 mg/dL (ref 0.0–1.0)
pH: 6.5 (ref 5.0–8.0)

## 2014-01-01 LAB — CBC WITH DIFFERENTIAL/PLATELET
BASOS PCT: 0 % (ref 0–1)
Basophils Absolute: 0 10*3/uL (ref 0.0–0.1)
EOS ABS: 0 10*3/uL (ref 0.0–0.7)
EOS PCT: 0 % (ref 0–5)
HCT: 39.2 % (ref 39.0–52.0)
Hemoglobin: 13.3 g/dL (ref 13.0–17.0)
LYMPHS ABS: 1.2 10*3/uL (ref 0.7–4.0)
Lymphocytes Relative: 11 % — ABNORMAL LOW (ref 12–46)
MCH: 30.5 pg (ref 26.0–34.0)
MCHC: 33.9 g/dL (ref 30.0–36.0)
MCV: 89.9 fL (ref 78.0–100.0)
MONOS PCT: 8 % (ref 3–12)
Monocytes Absolute: 0.9 10*3/uL (ref 0.1–1.0)
NEUTROS PCT: 81 % — AB (ref 43–77)
Neutro Abs: 8.9 10*3/uL — ABNORMAL HIGH (ref 1.7–7.7)
PLATELETS: 157 10*3/uL (ref 150–400)
RBC: 4.36 MIL/uL (ref 4.22–5.81)
RDW: 14.7 % (ref 11.5–15.5)
WBC: 10.9 10*3/uL — ABNORMAL HIGH (ref 4.0–10.5)

## 2014-01-01 LAB — COMPREHENSIVE METABOLIC PANEL
ALT: 21 U/L (ref 0–53)
ANION GAP: 12 (ref 5–15)
AST: 19 U/L (ref 0–37)
Albumin: 3.8 g/dL (ref 3.5–5.2)
Alkaline Phosphatase: 69 U/L (ref 39–117)
BUN: 13 mg/dL (ref 6–23)
CALCIUM: 8.7 mg/dL (ref 8.4–10.5)
CO2: 24 mEq/L (ref 19–32)
Chloride: 101 mEq/L (ref 96–112)
Creatinine, Ser: 0.77 mg/dL (ref 0.50–1.35)
GFR calc non Af Amer: 83 mL/min — ABNORMAL LOW (ref >90)
GLUCOSE: 94 mg/dL (ref 70–99)
POTASSIUM: 3.7 meq/L (ref 3.7–5.3)
SODIUM: 137 meq/L (ref 137–147)
Total Bilirubin: 0.6 mg/dL (ref 0.3–1.2)
Total Protein: 6.2 g/dL (ref 6.0–8.3)

## 2014-01-01 LAB — TROPONIN I

## 2014-01-01 LAB — LIPASE, BLOOD: Lipase: 26 U/L (ref 11–59)

## 2014-01-01 LAB — I-STAT TROPONIN, ED: Troponin i, poc: 0.01 ng/mL (ref 0.00–0.08)

## 2014-01-01 MED ORDER — PRAVASTATIN SODIUM 40 MG PO TABS
80.0000 mg | ORAL_TABLET | Freq: Every day | ORAL | Status: DC
  Administered 2014-01-01 – 2014-01-03 (×3): 80 mg via ORAL
  Filled 2014-01-01 (×4): qty 2

## 2014-01-01 MED ORDER — VANCOMYCIN HCL IN DEXTROSE 750-5 MG/150ML-% IV SOLN
750.0000 mg | Freq: Two times a day (BID) | INTRAVENOUS | Status: DC
  Administered 2014-01-02 – 2014-01-04 (×5): 750 mg via INTRAVENOUS
  Filled 2014-01-01 (×6): qty 150

## 2014-01-01 MED ORDER — ALBUTEROL SULFATE (2.5 MG/3ML) 0.083% IN NEBU
2.5000 mg | INHALATION_SOLUTION | RESPIRATORY_TRACT | Status: DC | PRN
  Administered 2014-01-02 (×2): 2.5 mg via RESPIRATORY_TRACT
  Filled 2014-01-01 (×2): qty 3

## 2014-01-01 MED ORDER — PANTOPRAZOLE SODIUM 40 MG PO TBEC
40.0000 mg | DELAYED_RELEASE_TABLET | Freq: Every day | ORAL | Status: DC
  Administered 2014-01-01 – 2014-01-04 (×4): 40 mg via ORAL
  Filled 2014-01-01 (×4): qty 1

## 2014-01-01 MED ORDER — CLONIDINE HCL 0.3 MG PO TABS
0.3000 mg | ORAL_TABLET | Freq: Two times a day (BID) | ORAL | Status: DC
  Administered 2014-01-01 – 2014-01-04 (×7): 0.3 mg via ORAL
  Filled 2014-01-01 (×8): qty 1

## 2014-01-01 MED ORDER — ALBUTEROL SULFATE (2.5 MG/3ML) 0.083% IN NEBU: 2.0000 mL | INHALATION_SOLUTION | Freq: Four times a day (QID) | RESPIRATORY_TRACT | Status: DC | PRN

## 2014-01-01 MED ORDER — POTASSIUM CHLORIDE CRYS ER 20 MEQ PO TBCR
20.0000 meq | EXTENDED_RELEASE_TABLET | Freq: Two times a day (BID) | ORAL | Status: DC
  Administered 2014-01-01 – 2014-01-04 (×7): 20 meq via ORAL
  Filled 2014-01-01 (×11): qty 1

## 2014-01-01 MED ORDER — PREDNISONE 20 MG PO TABS
40.0000 mg | ORAL_TABLET | Freq: Every day | ORAL | Status: DC
  Administered 2014-01-02 – 2014-01-04 (×3): 40 mg via ORAL
  Filled 2014-01-01 (×4): qty 2

## 2014-01-01 MED ORDER — DILTIAZEM HCL ER COATED BEADS 120 MG PO CP24
120.0000 mg | ORAL_CAPSULE | Freq: Two times a day (BID) | ORAL | Status: DC
  Administered 2014-01-01 – 2014-01-04 (×7): 120 mg via ORAL
  Filled 2014-01-01 (×9): qty 1

## 2014-01-01 MED ORDER — SIMVASTATIN 40 MG PO TABS
40.0000 mg | ORAL_TABLET | Freq: Every day | ORAL | Status: DC
  Filled 2014-01-01: qty 1

## 2014-01-01 MED ORDER — IPRATROPIUM-ALBUTEROL 0.5-2.5 (3) MG/3ML IN SOLN: 3.0000 mL | Freq: Four times a day (QID) | RESPIRATORY_TRACT | Status: DC | PRN

## 2014-01-01 MED ORDER — DEXTROSE 5 % IV SOLN
2.0000 g | Freq: Three times a day (TID) | INTRAVENOUS | Status: DC
  Filled 2014-01-01: qty 2

## 2014-01-01 MED ORDER — CEFEPIME HCL 2 G IJ SOLR
2.0000 g | Freq: Once | INTRAMUSCULAR | Status: AC
  Administered 2014-01-01: 2 g via INTRAVENOUS

## 2014-01-01 MED ORDER — SODIUM CHLORIDE 0.9 % IV BOLUS (SEPSIS)
500.0000 mL | Freq: Once | INTRAVENOUS | Status: AC
  Administered 2014-01-01: 500 mL via INTRAVENOUS

## 2014-01-01 MED ORDER — APIXABAN 5 MG PO TABS
5.0000 mg | ORAL_TABLET | Freq: Two times a day (BID) | ORAL | Status: DC
  Administered 2014-01-01 – 2014-01-04 (×7): 5 mg via ORAL
  Filled 2014-01-01 (×8): qty 1

## 2014-01-01 MED ORDER — CHOLESTYRAMINE 4 G PO PACK
4.0000 g | PACK | Freq: Every day | ORAL | Status: DC
  Administered 2014-01-01 – 2014-01-04 (×4): 4 g via ORAL
  Filled 2014-01-01 (×4): qty 1

## 2014-01-01 MED ORDER — VANCOMYCIN HCL IN DEXTROSE 1-5 GM/200ML-% IV SOLN
1000.0000 mg | Freq: Once | INTRAVENOUS | Status: AC
  Administered 2014-01-01: 1000 mg via INTRAVENOUS
  Filled 2014-01-01: qty 200

## 2014-01-01 MED ORDER — CALCIUM CARBONATE-VITAMIN D 500-200 MG-UNIT PO TABS
1.0000 | ORAL_TABLET | Freq: Two times a day (BID) | ORAL | Status: DC
  Administered 2014-01-01 – 2014-01-04 (×7): 1 via ORAL
  Filled 2014-01-01 (×8): qty 1

## 2014-01-01 MED ORDER — DEXTROSE 5 % IV SOLN
1.0000 g | Freq: Three times a day (TID) | INTRAVENOUS | Status: DC
  Administered 2014-01-01 – 2014-01-04 (×8): 1 g via INTRAVENOUS
  Filled 2014-01-01 (×10): qty 1

## 2014-01-01 MED ORDER — ISOSORB DINITRATE-HYDRALAZINE 20-37.5 MG PO TABS
1.0000 | ORAL_TABLET | Freq: Three times a day (TID) | ORAL | Status: DC
  Administered 2014-01-01 – 2014-01-04 (×9): 1 via ORAL
  Filled 2014-01-01 (×11): qty 1

## 2014-01-01 MED ORDER — ALBUTEROL SULFATE (2.5 MG/3ML) 0.083% IN NEBU
2.5000 mg | INHALATION_SOLUTION | Freq: Once | RESPIRATORY_TRACT | Status: AC
  Administered 2014-01-01: 2.5 mg via RESPIRATORY_TRACT
  Filled 2014-01-01: qty 3

## 2014-01-01 MED ORDER — BUDESONIDE 0.25 MG/2ML IN SUSP
0.2500 mg | Freq: Two times a day (BID) | RESPIRATORY_TRACT | Status: DC
  Administered 2014-01-01 – 2014-01-04 (×6): 0.25 mg via RESPIRATORY_TRACT
  Filled 2014-01-01 (×8): qty 2

## 2014-01-01 MED ORDER — LORAZEPAM 0.5 MG PO TABS
0.5000 mg | ORAL_TABLET | Freq: Two times a day (BID) | ORAL | Status: DC
  Administered 2014-01-01 – 2014-01-03 (×6): 0.5 mg via ORAL
  Administered 2014-01-04: 10:00:00 via ORAL
  Filled 2014-01-01 (×7): qty 1

## 2014-01-01 MED ORDER — GUAIFENESIN-DM 100-10 MG/5ML PO SYRP
5.0000 mL | ORAL_SOLUTION | ORAL | Status: DC | PRN
  Administered 2014-01-01: 5 mL via ORAL
  Filled 2014-01-01 (×2): qty 5

## 2014-01-01 MED ORDER — TORSEMIDE 20 MG PO TABS
20.0000 mg | ORAL_TABLET | Freq: Every day | ORAL | Status: DC | PRN
  Filled 2014-01-01: qty 1

## 2014-01-01 MED ORDER — ACETAMINOPHEN 325 MG PO TABS
650.0000 mg | ORAL_TABLET | Freq: Once | ORAL | Status: AC
  Administered 2014-01-01: 650 mg via ORAL
  Filled 2014-01-01: qty 2

## 2014-01-01 MED ORDER — FERROUS SULFATE 325 (65 FE) MG PO TABS
325.0000 mg | ORAL_TABLET | Freq: Every day | ORAL | Status: DC
  Administered 2014-01-01 – 2014-01-04 (×4): 325 mg via ORAL
  Filled 2014-01-01 (×4): qty 1

## 2014-01-01 MED ORDER — LOSARTAN POTASSIUM 50 MG PO TABS
100.0000 mg | ORAL_TABLET | Freq: Every day | ORAL | Status: DC
  Administered 2014-01-01 – 2014-01-04 (×4): 100 mg via ORAL
  Filled 2014-01-01 (×4): qty 2

## 2014-01-01 MED ORDER — LATANOPROST 0.005 % OP SOLN
1.0000 [drp] | Freq: Every day | OPHTHALMIC | Status: DC
  Administered 2014-01-01 – 2014-01-03 (×3): 1 [drp] via OPHTHALMIC
  Filled 2014-01-01: qty 2.5

## 2014-01-01 MED ORDER — BISOPROLOL FUMARATE 5 MG PO TABS
15.0000 mg | ORAL_TABLET | Freq: Every day | ORAL | Status: DC
  Administered 2014-01-01 – 2014-01-04 (×4): 15 mg via ORAL
  Filled 2014-01-01 (×5): qty 1

## 2014-01-01 MED ORDER — MONTELUKAST SODIUM 10 MG PO TABS
10.0000 mg | ORAL_TABLET | Freq: Every day | ORAL | Status: DC
  Administered 2014-01-01 – 2014-01-03 (×3): 10 mg via ORAL
  Filled 2014-01-01 (×4): qty 1

## 2014-01-01 NOTE — Progress Notes (Signed)
RN called ED for report.  

## 2014-01-01 NOTE — H&P (Signed)
[facility] History and Physical  Draydon Clairmont. QIW:[id number] DOB: [birth year] DOA: 01/01/2014  Referring physician: er PCP: Gwendolyn Grant, MD   Chief Complaint: fevers  HPI: [name]. is a 80 y.o. male  Who was seen by PCP on 9/4 for an ER follow up.  He was seen in the emergency room 12/09/13 for fever, chills, shortness of breath, and right pleuritic chest pain. He was found to have a temperature of 102F. White count was 11,800. Chest x-ray surprisingly did not reveal pneumonia but such was found in the right lower lobe on CT angiogram. He was discharged on levofloxacin.  He appeared to be improved and was out mowing grass.    Today patient presents with RUQ pain, cough, diarrhea and fevers.  Fevers of up to 102.  He had similar symptoms as previous PNA.  Patient denies sick contacts  In the ER, chest x ray showed right sided PNA- persistant, mild LE.  hospitalist were called to admit   Review of Systems:  All systems reviewed, negative unless stated above    Past Medical History  Diagnosis Date  . CORONARY HEART DISEASE     a. s/p CABG;  b.  cath 06/27/10: S-Dx occluded, S-PDA 80-90% (tx with PCI); S-OM ok, L-LAD ok;  EF 65-70%  c.  s/p Promus DES to S-PDA 06/2010;   d. Myoview 8/12: low risk  . FIBRILLATION, ATRIAL     post op; ?documented during hosp. 06/2010  . SLEEP APNEA 09/2001    NPSG AHI 22/HR  . ALLERGIC RHINITIS   . ASTHMA   . Depression   . GERD     with HH, hx esophageal stricture  . DYSLIPIDEMIA   . HYPERTENSION     Echo 3/12: EF 55-60%; mod LVH; mild AS/AI; LAE; PASP 38; mild pulmo HTN  . Personal history of alcoholism   . Diverticulosis   . Benign liver cyst   . Skin cancer     L forearm  . Cataract     surgery to both eyes  . Esophageal stricture   . Hiatal hernia   . Pneumonia 04/2013 hosp   Past Surgical History  Procedure Laterality Date  . Coronary artery bypass graft  02/2007  . Hernia repair  12/03/07  . Tonsillectomy    .  Coronary angioplasty with stent placement  07/2010  . Cataract extraction, bilateral  2007  . Hernia repair  unsure ?60's  . Cholecystectomy  02/21/2011    Procedure: LAPAROSCOPIC CHOLECYSTECTOMY WITH INTRAOPERATIVE CHOLANGIOGRAM;  Surgeon: Pedro Earls, MD;  Location: [facility];  Service: General;  Laterality: N/A;   Social History:  reports that he quit smoking about 34 years ago. His smoking use included Cigarettes. He has a 120 pack-year smoking history. He has never used smokeless tobacco. He reports that he does not drink alcohol or use illicit drugs.  Allergies  Allergen Reactions  . Ace Inhibitors Other (See Comments)    Severe asthma (COPD)  . Codeine Nausea And Vomiting  . Lasix [Furosemide] Itching    Family History  Problem Relation Age of Onset  . COPD Sister   . Asthma Sister   . Emphysema Sister   . Hypertension Sister   . Hypertension Mother   . Colon cancer Neg Hx   . Heart disease Brother   . Heart disease Mother   . Heart disease Father      Prior to Admission medications   Medication Sig Start Date End  Date Taking? Authorizing Provider  albuterol (PROVENTIL HFA;VENTOLIN HFA) 108 (90 BASE) MCG/ACT inhaler Inhale 2 puffs into the lungs every 6 (six) hours as needed for wheezing or shortness of breath.    Yes Historical Provider, MD  apixaban (ELIQUIS) 5 MG TABS tablet Take 1 tablet (5 mg total) by mouth 2 (two) times daily. 11/11/13  Yes Minus Breeding, MD  bisoprolol (ZEBETA) 10 MG tablet Take 1.5 tablets (15 mg total) by mouth daily. 12/31/13  Yes Minus Breeding, MD  budesonide (PULMICORT) 0.25 MG/2ML nebulizer solution Take 0.25 mg by nebulization 2 (two) times daily as needed. For asthma   Yes Historical Provider, MD  calcium-vitamin D (OSCAL WITH D) 500-200 MG-UNIT per tablet Take 1 tablet by mouth 2 (two) times daily.   Yes Historical Provider, MD  cholestyramine Lucrezia Starch) 4 G packet Take 4 g by mouth daily. 10/30/13  Yes Historical Provider, MD  cloNIDine  (CATAPRES) 0.3 MG tablet Take 1 tablet (0.3 mg total) by mouth 2 (two) times daily. 07/24/13  Yes Minus Breeding, MD  denosumab (PROLIA) 60 MG/ML SOLN injection Inject 60 mg into the skin every 6 (six) months. Administer in upper arm, thigh, or abdomen 06/12/12  Yes Rowe Clack, MD  diltiazem (CARDIZEM CD) 120 MG 24 hr capsule Take 1 capsule (120 mg total) by mouth 2 (two) times daily. 10/24/13  Yes Minus Breeding, MD  ferrous sulfate 325 (65 FE) MG tablet Take 325 mg by mouth daily.    Yes Historical Provider, MD  ipratropium-albuterol (DUONEB) 0.5-2.5 (3) MG/3ML SOLN Take 3 mLs by nebulization every 6 (six) hours as needed (for shortness of breath). 10/08/13  Yes Deneise Lever, MD  isosorbide-hydrALAZINE (BIDIL) 20-37.5 MG per tablet Take 1 tablet by mouth 3 (three) times daily. 08/13/13  Yes Rowe Clack, MD  latanoprost (XALATAN) 0.005 % ophthalmic solution Place 1 drop into both eyes at bedtime.    Yes Historical Provider, MD  LORazepam (ATIVAN) 0.5 MG tablet Take 0.5 mg by mouth 2 (two) times daily.   Yes Historical Provider, MD  losartan (COZAAR) 100 MG tablet Take 100 mg by mouth daily.   Yes Historical Provider, MD  mometasone-formoterol (DULERA) 100-5 MCG/ACT AERO Inhale 2 puffs into the lungs 2 (two) times daily.   Yes Historical Provider, MD  montelukast (SINGULAIR) 10 MG tablet Take 10 mg by mouth at bedtime.   Yes Historical Provider, MD  nitroGLYCERIN (NITROSTAT) 0.4 MG SL tablet Place 0.4 mg under the tongue every 5 (five) minutes as needed for chest pain.   Yes Historical Provider, MD  omeprazole (PRILOSEC) 20 MG capsule Take 1 capsule (20 mg total) by mouth daily. 04/29/13  Yes Irene Shipper, MD  potassium chloride SA (K-DUR,KLOR-CON) 20 MEQ tablet Take 20 mEq by mouth 2 (two) times daily.   Yes Historical Provider, MD  pravastatin (PRAVACHOL) 80 MG tablet Take 80 mg by mouth every evening.   Yes Historical Provider, MD  Probiotic Product ([place]) Take 1  capsule by mouth daily.   Yes Historical Provider, MD  torsemide (DEMADEX) 20 MG tablet Take 20 mg by mouth daily as needed (for fluid).    Yes Historical Provider, MD   Physical Exam: Filed Vitals:   01/01/14 1031 01/01/14 1115 01/01/14 1245 01/01/14 1308  BP:  139/57 125/51 125/51  Pulse:  102 103 101  Temp: 102.2 F (39 C)     TempSrc: Rectal     Resp:  19 23 19   SpO2:  95%  94% 96%    Wt Readings from Last 3 Encounters:  12/12/13 66.395 kg (146 lb 6 oz)  11/25/13 69.128 kg (152 lb 6.4 oz)  10/24/13 68.493 kg (151 lb)    General:  Appears calm and comfortable, elderly Eyes: PERRL, normal lids, irises & conjunctiva ENT: grossly normal hearing, lips & tongue Neck: no LAD, masses or thyromegaly Cardiovascular: RRR, no m/r/g. No LE edema. Telemetry: SR, no arrhythmias  Respiratory: wheezing b/l, no increased effort Abdomen: soft, ntnd Skin: no rash or induration seen on limited exam Musculoskeletal: grossly normal tone BUE/BLE Psychiatric: grossly normal mood and affect, speech fluent and appropriate Neurologic: grossly non-focal.          Labs on Admission:  Basic Metabolic Panel:  Recent Labs Lab 01/01/14 0915  NA 137  K 3.7  CL 101  CO2 24  GLUCOSE 94  BUN 13  CREATININE 0.77  CALCIUM 8.7   Liver Function Tests:  Recent Labs Lab 01/01/14 0915  AST 19  ALT 21  ALKPHOS 69  BILITOT 0.6  PROT 6.2  ALBUMIN 3.8    Recent Labs Lab 01/01/14 0915  LIPASE 26   No results found for this basename: AMMONIA,  in the last 168 hours CBC:  Recent Labs Lab 01/01/14 0915  WBC 10.9*  NEUTROABS 8.9*  HGB 13.3  HCT 39.2  MCV 89.9  PLT 157   Cardiac Enzymes:  Recent Labs Lab 01/01/14 0915  TROPONINI <0.30    BNP (last 3 results)  Recent Labs  03/08/13 2023 03/14/13 1047 05/04/13 1008  PROBNP 236.4 755.0* 1749.0*   CBG: No results found for this basename: GLUCAP,  in the last 168 hours  Radiological Exams on Admission: Dg Chest 2  View  01/01/2014   CLINICAL DATA:  Fever and chills.  History of pneumonia.  EXAM: CHEST  2 VIEW  COMPARISON:  CT 12/09/2013.  Chest x-ray 12/09/2013.  FINDINGS: Mediastinum hilar structures normal. Prior CABG. Stable cardiomegaly, normal pulmonary vascularity. Persistent right lower lobe infiltrate. No prominent pleural effusion. No pneumothorax. No acute osseous abnormality.  IMPRESSION: 1. Persistent right lower lobe infiltrate. 2. Prior CABG.  Mild cardiomegaly, no pulmonary venous congestion.   Electronically Signed   By: Marcello Moores  Register   On: 01/01/2014 10:25    EKG: Independently reviewed. Sinus tachy  Assessment/Plan Active Problems:   ESOPHAGEAL STRICTURE   Asthma exacerbation   Pneumonia   PNA- ? Aspiration, recent CT scan showed:Right lower lobe infiltrate consistent with pneumonia.  Sliding hiatal hernia with dilated thoracic esophagus. Thoracic esophagus contains fluid- will ask SLP to evaluate- may need barium swallow.   IV abx as patient had recent course of levaquin- ? resistant bacteria? -will screen for viral illnesses  Asthma- add steroid, nebs  H/o esophageal strictures- see above      Code Status: full DVT Prophylaxis: Family Communication: patient Disposition Plan:   Time spent: 75 min  Eulogio Bear [facility] Pager [phone]

## 2014-01-01 NOTE — ED Provider Notes (Signed)
CSN: [id number]     Arrival date & time 01/01/14  0848 History   First MD Initiated Contact with Patient 01/01/14 [phone]     Chief Complaint  Patient presents with  . Abdominal Pain     (Consider location/radiation/quality/duration/timing/severity/associated sxs/prior Treatment) Patient is a 80 y.o. male presenting with abdominal pain.  Abdominal Pain Pain location:  RUQ Pain quality: sharp   Pain radiates to:  Does not radiate Pain severity:  Severe Onset quality:  Gradual Duration:  2 days Timing:  Constant Progression:  Worsening Chronicity:  Recurrent Context comment:  Diagnosed with right lower lobe pneumonia several weeks ago. Had similar symptoms then. Relieved by:  Nothing Worsened by:  Movement and coughing Associated symptoms: cough and shortness of breath   Associated symptoms: no fever, no nausea and no vomiting   Associated symptoms comment:  Myalgias   Past Medical History  Diagnosis Date  . CORONARY HEART DISEASE     a. s/p CABG;  b.  cath 06/27/10: S-Dx occluded, S-PDA 80-90% (tx with PCI); S-OM ok, L-LAD ok;  EF 65-70%  c.  s/p Promus DES to S-PDA 06/2010;   d. Myoview 8/12: low risk  . FIBRILLATION, ATRIAL     post op; ?documented during hosp. 06/2010  . SLEEP APNEA 09/2001    NPSG AHI 22/HR  . ALLERGIC RHINITIS   . ASTHMA   . Depression   . GERD     with HH, hx esophageal stricture  . DYSLIPIDEMIA   . HYPERTENSION     Echo 3/12: EF 55-60%; mod LVH; mild AS/AI; LAE; PASP 38; mild pulmo HTN  . Personal history of alcoholism   . Diverticulosis   . Benign liver cyst   . Skin cancer     L forearm  . Cataract     surgery to both eyes  . Esophageal stricture   . Hiatal hernia   . Pneumonia 04/2013 hosp   Past Surgical History  Procedure Laterality Date  . Coronary artery bypass graft  02/2007  . Hernia repair  12/03/07  . Tonsillectomy    . Coronary angioplasty with stent placement  07/2010  . Cataract extraction, bilateral  2007  . Hernia repair   unsure ?60's  . Cholecystectomy  02/21/2011    Procedure: LAPAROSCOPIC CHOLECYSTECTOMY WITH INTRAOPERATIVE CHOLANGIOGRAM;  Surgeon: Pedro Earls, MD;  Location: [facility];  Service: General;  Laterality: N/A;   Family History  Problem Relation Age of Onset  . COPD Sister   . Asthma Sister   . Emphysema Sister   . Hypertension Sister   . Hypertension Mother   . Colon cancer Neg Hx   . Heart disease Brother   . Heart disease Mother   . Heart disease Father    History  Substance Use Topics  . Smoking status: Former Smoker -- 3.00 packs/day for 40 years    Types: Cigarettes    Quit date: 04/11/1979  . Smokeless tobacco: Never Used  . Alcohol Use: No    Review of Systems  Constitutional: Negative for fever.  Respiratory: Positive for cough and shortness of breath.   Gastrointestinal: Positive for abdominal pain. Negative for nausea and vomiting.  All other systems reviewed and are negative.     Allergies  Ace inhibitors; Codeine; and Lasix  Home Medications   Prior to Admission medications   Medication Sig Start Date End Date Taking? Authorizing Provider  albuterol (PROVENTIL HFA;VENTOLIN HFA) 108 (90 BASE) MCG/ACT inhaler Inhale 2 puffs into the lungs  every 6 (six) hours as needed for wheezing or shortness of breath.    Yes Historical Provider, MD  apixaban (ELIQUIS) 5 MG TABS tablet Take 1 tablet (5 mg total) by mouth 2 (two) times daily. 11/11/13  Yes Minus Breeding, MD  bisoprolol (ZEBETA) 10 MG tablet Take 1.5 tablets (15 mg total) by mouth daily. 12/31/13  Yes Minus Breeding, MD  budesonide (PULMICORT) 0.25 MG/2ML nebulizer solution Take 0.25 mg by nebulization 2 (two) times daily as needed. For asthma   Yes Historical Provider, MD  calcium-vitamin D (OSCAL WITH D) 500-200 MG-UNIT per tablet Take 1 tablet by mouth 2 (two) times daily.   Yes Historical Provider, MD  cholestyramine Lucrezia Starch) 4 G packet Take 4 g by mouth daily. 10/30/13  Yes Historical Provider, MD   cloNIDine (CATAPRES) 0.3 MG tablet Take 1 tablet (0.3 mg total) by mouth 2 (two) times daily. 07/24/13  Yes Minus Breeding, MD  denosumab (PROLIA) 60 MG/ML SOLN injection Inject 60 mg into the skin every 6 (six) months. Administer in upper arm, thigh, or abdomen 06/12/12  Yes Rowe Clack, MD  diltiazem (CARDIZEM CD) 120 MG 24 hr capsule Take 1 capsule (120 mg total) by mouth 2 (two) times daily. 10/24/13  Yes Minus Breeding, MD  ferrous sulfate 325 (65 FE) MG tablet Take 325 mg by mouth daily.    Yes Historical Provider, MD  ipratropium-albuterol (DUONEB) 0.5-2.5 (3) MG/3ML SOLN Take 3 mLs by nebulization every 6 (six) hours as needed (for shortness of breath). 10/08/13  Yes Deneise Lever, MD  isosorbide-hydrALAZINE (BIDIL) 20-37.5 MG per tablet Take 1 tablet by mouth 3 (three) times daily. 08/13/13  Yes Rowe Clack, MD  latanoprost (XALATAN) 0.005 % ophthalmic solution Place 1 drop into both eyes at bedtime.    Yes Historical Provider, MD  LORazepam (ATIVAN) 0.5 MG tablet Take 0.5 mg by mouth 2 (two) times daily.   Yes Historical Provider, MD  losartan (COZAAR) 100 MG tablet Take 100 mg by mouth daily.   Yes Historical Provider, MD  mometasone-formoterol (DULERA) 100-5 MCG/ACT AERO Inhale 2 puffs into the lungs 2 (two) times daily.   Yes Historical Provider, MD  montelukast (SINGULAIR) 10 MG tablet Take 10 mg by mouth at bedtime.   Yes Historical Provider, MD  nitroGLYCERIN (NITROSTAT) 0.4 MG SL tablet Place 0.4 mg under the tongue every 5 (five) minutes as needed for chest pain.   Yes Historical Provider, MD  omeprazole (PRILOSEC) 20 MG capsule Take 1 capsule (20 mg total) by mouth daily. 04/29/13  Yes Irene Shipper, MD  potassium chloride SA (K-DUR,KLOR-CON) 20 MEQ tablet Take 20 mEq by mouth 2 (two) times daily.   Yes Historical Provider, MD  pravastatin (PRAVACHOL) 80 MG tablet Take 80 mg by mouth every evening.   Yes Historical Provider, MD  Probiotic Product ([place])  Take 1 capsule by mouth daily.   Yes Historical Provider, MD  torsemide (DEMADEX) 20 MG tablet Take 20 mg by mouth daily as needed (for fluid).    Yes Historical Provider, MD   BP 150/71  Pulse 107  Temp(Src) 98.4 F (36.9 C)  Resp 21  SpO2 93% Physical Exam  Nursing note and vitals reviewed. Constitutional: He is oriented to person, place, and time. He appears well-developed and well-nourished. No distress.  HENT:  Head: Normocephalic and atraumatic.  Mouth/Throat: Oropharynx is clear and moist.  Eyes: Conjunctivae are normal. Pupils are equal, round, and reactive to light. No scleral icterus.  Neck: Neck supple.  Cardiovascular: Normal rate, regular rhythm, normal heart sounds and intact distal pulses.   No murmur heard. Pulmonary/Chest: Effort normal. No stridor. No respiratory distress. He has decreased breath sounds in the right lower field. He has no wheezes. He has rales in the right middle field.  Abdominal: Soft. He exhibits no distension. There is tenderness in the right upper quadrant. There is no rigidity, no rebound and no guarding.  Musculoskeletal: Normal range of motion. He exhibits no edema.  Neurological: He is alert and oriented to person, place, and time.  Skin: Skin is warm and dry. No rash noted.  Psychiatric: He has a normal mood and affect. His behavior is normal.    ED Course  Procedures (including critical care time) Labs Review Labs Reviewed  CBC WITH DIFFERENTIAL - Abnormal; Notable for the following:    WBC 10.9 (*)    Neutrophils Relative % 81 (*)    Neutro Abs 8.9 (*)    Lymphocytes Relative 11 (*)    All other components within normal limits  COMPREHENSIVE METABOLIC PANEL - Abnormal; Notable for the following:    GFR calc non Af Amer 83 (*)    All other components within normal limits  CULTURE, BLOOD (ROUTINE X 2)  CULTURE, BLOOD (ROUTINE X 2)  RESPIRATORY VIRUS PANEL  LIPASE, BLOOD  TROPONIN I  URINALYSIS, ROUTINE W REFLEX MICROSCOPIC   I-STAT TROPOININ, ED    Imaging Review Dg Chest 2 View  01/01/2014   CLINICAL DATA:  Fever and chills.  History of pneumonia.  EXAM: CHEST  2 VIEW  COMPARISON:  CT 12/09/2013.  Chest x-ray 12/09/2013.  FINDINGS: Mediastinum hilar structures normal. Prior CABG. Stable cardiomegaly, normal pulmonary vascularity. Persistent right lower lobe infiltrate. No prominent pleural effusion. No pneumothorax. No acute osseous abnormality.  IMPRESSION: 1. Persistent right lower lobe infiltrate. 2. Prior CABG.  Mild cardiomegaly, no pulmonary venous congestion.   Electronically Signed   By: Marcello Moores  Register   On: 01/01/2014 10:25     EKG Interpretation None      MDM   Final diagnoses:  Right lower lobe pneumonia  Asthma exacerbation  Gastroesophageal reflux disease without esophagitis    80 year old male presenting with right upper quadrant pain, cough, and malaise. He states he had similar symptoms and was diagnosed with pneumonia several weeks ago. He has recovered reasonably well from that illness, reporting that he has been mowing yards.    Chest x-ray today shows right lower lobe opacity. Given recent antibiotic use, have initiated treatment for healthcare associated pneumonia. Have consulted Dr. Eliseo Squires for admission.  Houston Siren III, MD 01/01/14 [phone]

## 2014-01-01 NOTE — ED Notes (Signed)
Pt in from home via [facility], per report pt c/o RUQ abd pain onset this am, reports cough, denies CP, n/v, c/o diarrhea x 4, denies bloody or dark colored stools, pt A&O x4, follows commands, speaks in complete sentences

## 2014-01-01 NOTE — Progress Notes (Signed)
NURSING PROGRESS NOTE  [name] [id number] Admission Data: 01/01/2014 3:03 PM Attending Provider: Geradine Girt, DO QXI:[id number] [name], MD Code Status: full  [name] is a 80 y.o. male patient admitted from ED:  -No acute distress noted.  -No complaints of shortness of breath.  -No complaints of chest pain.   Cardiac Monitoring: Box #  in place. Cardiac monitor yields:normal sinus rhythm, sinus tachycardia.  Blood pressure 162/61, pulse 100, temperature 99.8 F (37.7 C), temperature source Oral, resp. rate 20, height 5\' 2"  (1.575 m), weight 67.087 kg (147 lb 14.4 oz), SpO2 99.00%.   IV Fluids:  IV in place, occlusive dsg intact without redness, IV cath wrist right, condition patent and no redness and left, condition patent and no redness normal saline.   Allergies:  Ace inhibitors; Codeine; and Lasix  Past Medical History:   has a past medical history of CORONARY HEART DISEASE; FIBRILLATION, ATRIAL; SLEEP APNEA (09/2001); ALLERGIC RHINITIS; ASTHMA; Depression; GERD; DYSLIPIDEMIA; HYPERTENSION; Personal history of alcoholism; Diverticulosis; Benign liver cyst; Skin cancer; Cataract; Esophageal stricture; Hiatal hernia; Pneumonia (04/2013 hosp); and Shortness of breath.  Past Surgical History:   has past surgical history that includes Coronary artery bypass graft (02/2007); Hernia repair (12/03/07); Tonsillectomy; Coronary angioplasty with stent (07/2010); Cataract extraction, bilateral (2007); Hernia repair (unsure ?60's); and Cholecystectomy (02/21/2011).  Social History:   reports that he quit smoking about 34 years ago. His smoking use included Cigarettes. He has a 120 pack-year smoking history. He has never used smokeless tobacco. He reports that he does not drink alcohol or use illicit drugs.  Skin: bruises bilat arms    Patient/Family orientated to room. Information packet given to patient/family. Admission inpatient armband information verified with  patient/family to include name and date of birth and placed on patient arm. Side rails up x 2, fall assessment and education completed with patient/family. Patient/family able to verbalize understanding of risk associated with falls and verbalized understanding to call for assistance before getting out of bed. Call light within reach. Patient/family able to voice and demonstrate understanding of unit orientation instructions.    Will continue to evaluate and treat per MD orders.

## 2014-01-01 NOTE — Progress Notes (Signed)
Utilization review completed.  

## 2014-01-01 NOTE — Progress Notes (Signed)
ANTIBIOTIC CONSULT NOTE - INITIAL  Pharmacy Consult for vancomycin and cefepime Indication: HCAP  Allergies  Allergen Reactions  . Ace Inhibitors Other (See Comments)    Severe asthma (COPD)  . Codeine Nausea And Vomiting  . Lasix [Furosemide] Itching    Patient Measurements:    Weight: 66.4 kg  Vital Signs: Temp: 102.2 F (39 C) (09/24 1031) Temp src: Rectal (09/24 1031) BP: 139/57 mmHg (09/24 1115) Pulse Rate: 102 (09/24 1115) Intake/Output from previous day:   Intake/Output from this shift:    Labs:  Recent Labs  01/01/14 0915  WBC 10.9*  HGB 13.3  PLT 157  CREATININE 0.77   The CrCl is unknown because both a height and weight (above a minimum accepted value) are required for this calculation. No results found for this basename: VANCOTROUGH, VANCOPEAK, VANCORANDOM, [place], GENTPEAK, GENTRANDOM, [place], TOBRAPEAK, TOBRARND, AMIKACINPEAK, AMIKACINTROU, AMIKACIN,  in the last 72 hours   Microbiology: Recent Results (from the past 720 hour(s))  CULTURE, BLOOD (ROUTINE X 2)     Status: None   Collection Time    12/09/13 11:01 AM      Result Value Ref Range Status   Specimen Description BLOOD ARM RIGHT   Final   Special Requests BOTTLES DRAWN AEROBIC AND ANAEROBIC 5ML   Final   Culture  Setup Time     Final   Value: 12/09/2013 16:14     Performed at [facility]   Culture     Final   Value: NO GROWTH 5 DAYS     Performed at [facility]   Report Status 12/15/2013 FINAL   Final  CULTURE, BLOOD (ROUTINE X 2)     Status: None   Collection Time    12/09/13 11:10 AM      Result Value Ref Range Status   Specimen Description BLOOD RIGHT ANTECUBITAL   Final   Special Requests BOTTLES DRAWN AEROBIC AND ANAEROBIC 10ML   Final   Culture  Setup Time     Final   Value: 12/09/2013 16:13     Performed at [facility]   Culture     Final   Value: NO GROWTH 5 DAYS     Performed at [facility]   Report Status 12/15/2013 FINAL    Final    Medical History: Past Medical History  Diagnosis Date  . CORONARY HEART DISEASE     a. s/p CABG;  b.  cath 06/27/10: S-Dx occluded, S-PDA 80-90% (tx with PCI); S-OM ok, L-LAD ok;  EF 65-70%  c.  s/p Promus DES to S-PDA 06/2010;   d. Myoview 8/12: low risk  . FIBRILLATION, ATRIAL     post op; ?documented during hosp. 06/2010  . SLEEP APNEA 09/2001    NPSG AHI 22/HR  . ALLERGIC RHINITIS   . ASTHMA   . Depression   . GERD     with HH, hx esophageal stricture  . DYSLIPIDEMIA   . HYPERTENSION     Echo 3/12: EF 55-60%; mod LVH; mild AS/AI; LAE; PASP 38; mild pulmo HTN  . Personal history of alcoholism   . Diverticulosis   . Benign liver cyst   . Skin cancer     L forearm  . Cataract     surgery to both eyes  . Esophageal stricture   . Hiatal hernia   . Pneumonia 04/2013 hosp     Assessment: 80 yo M with chief complaint of abdominal pain.  Pharmacy consulted to dose vancomycin  and cefepime for HCAP.  Pt with RUQ pain, cough, malaise.  Says he was diagnosed with PNA several weeks ago.  Not currently taking any antibiotics. WBC 10.9, creat 0.77, temp 102.2, wt 66.4 kg. CXR: perisitent RLL infiltrate  Cefepime 9/24>> vanc 9/24 >>  9/24 BC x2  Goal of Therapy:  Vancomycin trough level 15-20 mcg/ml  Plan:  -cefepime 2 gm load then cefepime 1 gm IV q8h -vancomycin 1 gm load then vancomycin 750 mg IV q12h -f/u renal fxn, wbc, temp, culture data, CXR -steady-state vancomycin trough as needed  Eudelia Bunch, Pharm.D. [phone] 01/01/2014 11:48 AM

## 2014-01-02 ENCOUNTER — Inpatient Hospital Stay (HOSPITAL_COMMUNITY): Payer: Medicare Other

## 2014-01-02 DIAGNOSIS — I10 Essential (primary) hypertension: Secondary | ICD-10-CM

## 2014-01-02 DIAGNOSIS — I2510 Atherosclerotic heart disease of native coronary artery without angina pectoris: Secondary | ICD-10-CM

## 2014-01-02 DIAGNOSIS — K222 Esophageal obstruction: Secondary | ICD-10-CM

## 2014-01-02 LAB — LEGIONELLA ANTIGEN, URINE: LEGIONELLA ANTIGEN, URINE: NEGATIVE

## 2014-01-02 LAB — HIV ANTIBODY (ROUTINE TESTING W REFLEX): HIV: NONREACTIVE

## 2014-01-02 LAB — STREP PNEUMONIAE URINARY ANTIGEN: Strep Pneumo Urinary Antigen: NEGATIVE

## 2014-01-02 MED ORDER — ALBUTEROL SULFATE (2.5 MG/3ML) 0.083% IN NEBU
3.0000 mL | INHALATION_SOLUTION | Freq: Three times a day (TID) | RESPIRATORY_TRACT | Status: DC
  Administered 2014-01-03 – 2014-01-04 (×4): 3 mL via RESPIRATORY_TRACT
  Filled 2014-01-02 (×4): qty 3

## 2014-01-02 MED ORDER — ALBUTEROL SULFATE (2.5 MG/3ML) 0.083% IN NEBU
2.0000 mL | INHALATION_SOLUTION | Freq: Three times a day (TID) | RESPIRATORY_TRACT | Status: DC
  Administered 2014-01-02: 15:00:00 via RESPIRATORY_TRACT
  Filled 2014-01-02: qty 3

## 2014-01-02 NOTE — Care Management Note (Signed)
    Page 1 of 1   01/02/2014     12:09:27 PM CARE MANAGEMENT NOTE 01/02/2014  Patient:  [name], [name]   Account Number:  [id number]  Date Initiated:  01/02/2014  Documentation initiated by:  Tomi Bamberger  Subjective/Objective Assessment:   dx pna  admit- lives with spouse.     Action/Plan:   9/25 MBS   Anticipated DC Date:  01/04/2014   Anticipated DC Plan:  [place]  CM consult      Choice offered to / List presented to:             Status of service:  In process, will continue to follow Medicare Important Message given?  YES (If response is "NO", the following Medicare IM given date fields will be blank) Date Medicare IM given:  01/02/2014 Medicare IM given by:  Tomi Bamberger Date Additional Medicare IM given:   Additional Medicare IM given by:    Discharge Disposition:    Per UR Regulation:  Reviewed for med. necessity/level of care/duration of stay  If discussed at [place] of Stay Meetings, dates discussed:    Comments:

## 2014-01-02 NOTE — Progress Notes (Addendum)
Speech Language Pathology  Patient Details Name: [name]. MRN: [id number] DOB: [birth year] Today's Date: 01/02/2014 Time:  -       MBS scheduled for today at [place] M.[employer] [phone]

## 2014-01-02 NOTE — Procedures (Signed)
Objective Swallowing Evaluation: Modified Barium Swallowing Study  Patient Details  Name: [name]. MRN: [id number] Date of Birth: 10-27-1930  Today's Date: 01/02/2014 Time: 1340-1400 SLP Time Calculation (min): 20 min  Past Medical History:  Past Medical History  Diagnosis Date  . CORONARY HEART DISEASE     a. s/p CABG;  b.  cath 06/27/10: S-Dx occluded, S-PDA 80-90% (tx with PCI); S-OM ok, L-LAD ok;  EF 65-70%  c.  s/p Promus DES to S-PDA 06/2010;   d. Myoview 8/12: low risk  . FIBRILLATION, ATRIAL     post op; ?documented during hosp. 06/2010  . SLEEP APNEA 09/2001    NPSG AHI 22/HR  . ALLERGIC RHINITIS   . ASTHMA   . Depression   . GERD     with HH, hx esophageal stricture  . DYSLIPIDEMIA   . HYPERTENSION     Echo 3/12: EF 55-60%; mod LVH; mild AS/AI; LAE; PASP 38; mild pulmo HTN  . Personal history of alcoholism   . Diverticulosis   . Benign liver cyst   . Skin cancer     L forearm  . Cataract     surgery to both eyes  . Esophageal stricture   . Hiatal hernia   . Pneumonia 04/2013 hosp  . Shortness of breath    Past Surgical History:  Past Surgical History  Procedure Laterality Date  . Coronary artery bypass graft  02/2007  . Hernia repair  12/03/07  . Tonsillectomy    . Coronary angioplasty with stent placement  07/2010  . Cataract extraction, bilateral  2007  . Hernia repair  unsure ?60's  . Cholecystectomy  02/21/2011    Procedure: LAPAROSCOPIC CHOLECYSTECTOMY WITH INTRAOPERATIVE CHOLANGIOGRAM;  Surgeon: Pedro Earls, MD;  Location: [facility];  Service: General;  Laterality: N/A;   HPI:  80 y.o. with PMH:  CAD, A-fob, asthma, GERD, esophageal stricture, hiatal hernia, pna admitted with right upper quadrant pain, cough, fever.  Recent admission 12/09/13 with pna.  Chest x ray showed right sided PNA- persistant, mild LE.  MD questions aspiraiotn pna.  CT showed RRL infiltrate consistent with pna, sliding hernia with dilated thoracic esophagus.  Bedside swallow  06/2013 resulted in functional swallow with regular diet/thin liquids recommeded.     Assessment / Plan / Recommendation Clinical Impression  Dysphagia Diagnosis: Mild pharyngeal phase dysphagia Clinical impression: Pt. exhibited a mild motor based pharyngeal dysphagia due to decreased laryngeal elevation and laryngeal closure resulting in min-mild silent penetration with thin barium.  A chin tuck posture decreased penetration to flash (in and out of laryngeal vestibule).  Recurrent pna's may be due to combination of pharyngeal as well as his diagnosed small hiatal hernia and dilated thoracic esophagus diagnosed during chest CT 12/09/13.  SLP recommends continuing a regular diet and thin liquids with chin tuck (liquids only) and esophageal precautions with ST follow up for reiteration of strategies.       Treatment Recommendation  Therapy as outlined in treatment plan below    Diet Recommendation Regular;Thin liquid   Liquid Administration via: Cup;No straw Medication Administration: Whole meds with puree Supervision: Patient able to self feed Compensations: Slow rate;Small sips/bites Postural Changes and/or Swallow Maneuvers: Chin tuck;Seated upright 90 degrees;Upright 30-60 min after meal    Other  Recommendations Oral Care Recommendations: Oral care BID   Follow Up Recommendations  None    Frequency and Duration min 1 x/week  2 weeks   Pertinent Vitals/Pain No pain  Reason for Referral Objectively evaluate swallowing function   Oral Phase Oral Preparation/Oral Phase Oral Phase: Impaired Oral - Nectar Oral - Nectar Teaspoon: Lingual/palatal residue Oral - Nectar Cup: Not tested   Pharyngeal Phase Pharyngeal Phase Pharyngeal Phase: Impaired Pharyngeal - Nectar Pharyngeal - Nectar Teaspoon: Within functional limits Pharyngeal - Thin Pharyngeal - Thin Teaspoon: Within functional limits Pharyngeal - Thin Cup: Reduced laryngeal elevation;Reduced airway/laryngeal  closure;Penetration/Aspiration during swallow Penetration/Aspiration details (thin cup): Material enters airway, remains ABOVE vocal cords and not ejected out Pharyngeal - Solids Pharyngeal - Regular: Within functional limits  Cervical Esophageal Phase    GO    Cervical Esophageal Phase Cervical Esophageal Phase: Darryll Capers         Houston Siren 01/02/2014, 3:11 PM  Orbie Pyo Colvin Caroli.[employer] [phone]

## 2014-01-02 NOTE — Progress Notes (Signed)
PATIENT DETAILS Name: [name]. Age: 80 y.o. Sex: male Date of Birth: 09-06-1930 Admit Date: 01/01/2014 Admitting Physician Geradine Girt, DO [id number] [name], MD  Subjective: Claims to be much better-now with very minimal right upper quadrant pain. Claims breathing significantly improved  Assessment/Plan: Active Problems: Recurrent pneumonia - History of severe chronic asthma-now with numerous episodes of pneumonia this year. X-ray on admission shows right lower lobe pneumonia, recent CT angiogram of the chest done on 9/1 showed right lower lobe infiltrate as well. - Patient was admitted, started on empiric vancomycin and cefepime, significantly improved overnight, with decrease in shortness of breath and the right upper quadrant pain. Remains afebrile. Continue with empiric vancomycin and cefepime-Day 2.HIV negative. Await respiratory virus panel. Blood culture on 9/24 negative so far - Because of recurrent pneumonia-speech therapy evaluation obtained-MBS scheduled for today. Apparently has a history of esophageal strictures/GERD-may have reflux causing the recurrent pneumonias. If MBS suggestive of significant GI pathology, may need GI evaluation at some point.  Asthma exacerbation - Significantly improved- lungs with very minimal wheezing. Continue with nebulized bronchodilators, empiric antibiotics as above, and prednisone  Right upper quadrant pain - Suspect secondary to right lower lobe pneumonia. Patient is status post cholecystectomy in 2012. On exam-no right upper quadrant pain,abdomen is completely benign on exam.  GERD with history of esophageal stricture - Continue with PPI- see above  History of atrial fibrillation - Rate controlled - Continue with bisoprolol and Cardizem, on Eliquis for anticoagulation  History of hypertension - Controlled-continue with bisoprolol, clonidine, Cardizem, Bidil and losartan  History of coronary disease status post  CABG and PCI - Stable. No chest pain. - Continue with beta blocker and losartan.  Disposition: Remain inpatient  DVT Prophylaxis: On Eliquis  Code Status: Full code   Family Communication None at bedside  Procedures:  None  CONSULTS:  None  Time spent 40 minutes-which includes 50% of the time with face-to-face with patient/ family and coordinating care related to the above assessment and plan.    MEDICATIONS: Scheduled Meds: . apixaban  5 mg Oral BID  . bisoprolol  15 mg Oral Daily  . budesonide  0.25 mg Nebulization BID  . calcium-vitamin D  1 tablet Oral BID  . ceFEPime (MAXIPIME) IV  1 g Intravenous Q8H  . cholestyramine  4 g Oral Daily  . cloNIDine  0.3 mg Oral BID  . diltiazem  120 mg Oral BID  . ferrous sulfate  325 mg Oral Daily  . isosorbide-hydrALAZINE  1 tablet Oral TID  . latanoprost  1 drop Both Eyes QHS  . LORazepam  0.5 mg Oral BID  . losartan  100 mg Oral Daily  . montelukast  10 mg Oral QHS  . pantoprazole  40 mg Oral Daily  . potassium chloride SA  20 mEq Oral BID  . pravastatin  80 mg Oral q1800  . predniSONE  40 mg Oral Q breakfast  . vancomycin  750 mg Intravenous Q12H   Continuous Infusions:  PRN Meds:.albuterol, albuterol, guaiFENesin-dextromethorphan, ipratropium-albuterol, torsemide  Antibiotics: Anti-infectives   Start     Dose/Rate Route Frequency Ordered Stop   01/02/14 0000  vancomycin (VANCOCIN) IVPB 750 mg/150 ml premix     750 mg 150 mL/hr over 60 Minutes Intravenous Every 12 hours 01/01/14 1140     01/01/14 2000  ceFEPIme (MAXIPIME) 1 g in dextrose 5 % 50 mL IVPB     1 g 100 mL/hr over  30 Minutes Intravenous Every 8 hours 01/01/14 1142     01/01/14 1230  vancomycin (VANCOCIN) IVPB 1000 mg/200 mL premix     1,000 mg 200 mL/hr over 60 Minutes Intravenous  Once 01/01/14 1140 01/01/14 1356   01/01/14 1200  ceFEPIme (MAXIPIME) 2 g in dextrose 5 % 50 mL IVPB  Status:  Discontinued     2 g 100 mL/hr over 30 Minutes  Intravenous Every 8 hours 01/01/14 1137 01/01/14 1142   01/01/14 1200  ceFEPIme (MAXIPIME) 2 g in dextrose 5 % 50 mL IVPB     2 g 100 mL/hr over 30 Minutes Intravenous  Once 01/01/14 1142 01/01/14 1250       PHYSICAL EXAM: Vital signs in last 24 hours: Filed Vitals:   01/02/14 0605 01/02/14 0618 01/02/14 1015 01/02/14 1130  BP: 128/94  147/59   Pulse: 69  74   Temp: 99.6 F (37.6 C)     TempSrc: Oral     Resp: 16     Height:    5\' 2"  (1.575 m)  Weight:    66.679 kg (147 lb)  SpO2: 97% 97%      Weight change:  Filed Weights   01/01/14 1408 01/02/14 1130  Weight: 67.087 kg (147 lb 14.4 oz) 66.679 kg (147 lb)   Body mass index is 26.88 kg/(m^2).   Gen Exam: Awake and alert with clear speech.   Neck: Supple, No JVD.  Chest: B/L Clear.  Scattered Rhonchi CVS: S1 S2 Regular, no murmurs. Abdomen: soft, BS +, non tender, non distended.  Extremities: no edema, lower extremities warm to touch. Neurologic: Non Focal.   Skin: No Rash.   Wounds: N/A.    Intake/Output from previous day:  Intake/Output Summary (Last 24 hours) at 01/02/14 1413 Last data filed at 01/02/14 1022  Gross per 24 hour  Intake    476 ml  Output    800 ml  Net   -324 ml     LAB RESULTS: CBC  Recent Labs Lab 01/01/14 0915  WBC 10.9*  HGB 13.3  HCT 39.2  PLT 157  MCV 89.9  MCH 30.5  MCHC 33.9  RDW 14.7  LYMPHSABS 1.2  MONOABS 0.9  EOSABS 0.0  BASOSABS 0.0    Chemistries   Recent Labs Lab 01/01/14 0915  NA 137  K 3.7  CL 101  CO2 24  GLUCOSE 94  BUN 13  CREATININE 0.77  CALCIUM 8.7    CBG: No results found for this basename: GLUCAP,  in the last 168 hours  GFR Estimated Creatinine Clearance: 61.9 ml/min (by C-G formula based on Cr of 0.77).  Coagulation profile No results found for this basename: INR, PROTIME,  in the last 168 hours  Cardiac Enzymes  Recent Labs Lab 01/01/14 0915  TROPONINI <0.30    No components found with this basename: POCBNP,  No results  found for this basename: DDIMER,  in the last 72 hours No results found for this basename: HGBA1C,  in the last 72 hours No results found for this basename: CHOL, HDL, LDLCALC, TRIG, CHOLHDL, LDLDIRECT,  in the last 72 hours No results found for this basename: TSH, T4TOTAL, FREET3, T3FREE, THYROIDAB,  in the last 72 hours No results found for this basename: VITAMINB12, FOLATE, FERRITIN, TIBC, IRON, RETICCTPCT,  in the last 72 hours  Recent Labs  01/01/14 0915  LIPASE 26    Urine Studies No results found for this basename: UACOL, UAPR, USPG, UPH, UTP, UGL, UKET, UBIL,  UHGB, UNIT, UROB, ULEU, UEPI, UWBC, URBC, UBAC, CAST, CRYS, UCOM, BILUA,  in the last 72 hours  MICROBIOLOGY: Recent Results (from the past 240 hour(s))  CULTURE, BLOOD (ROUTINE X 2)     Status: None   Collection Time    01/01/14 12:00 PM      Result Value Ref Range Status   Specimen Description BLOOD LEFT WRIST   Final   Special Requests BOTTLES DRAWN AEROBIC AND ANAEROBIC 5CC   Final   Culture  Setup Time     Final   Value: 01/01/2014 15:54     Performed at [facility]   Culture     Final   Value:        BLOOD CULTURE RECEIVED NO GROWTH TO DATE CULTURE WILL BE HELD FOR 5 DAYS BEFORE ISSUING A FINAL NEGATIVE REPORT     Performed at [facility]   Report Status PENDING   Incomplete  CULTURE, BLOOD (ROUTINE X 2)     Status: None   Collection Time    01/01/14 12:05 PM      Result Value Ref Range Status   Specimen Description BLOOD LEFT ARM   Final   Special Requests BOTTLES DRAWN AEROBIC AND ANAEROBIC 5CC   Final   Culture  Setup Time     Final   Value: 01/01/2014 15:55     Performed at [facility]   Culture     Final   Value:        BLOOD CULTURE RECEIVED NO GROWTH TO DATE CULTURE WILL BE HELD FOR 5 DAYS BEFORE ISSUING A FINAL NEGATIVE REPORT     Performed at [facility]   Report Status PENDING   Incomplete    RADIOLOGY STUDIES/RESULTS: Dg Chest 2 View  01/01/2014    CLINICAL DATA:  Fever and chills.  History of pneumonia.  EXAM: CHEST  2 VIEW  COMPARISON:  CT 12/09/2013.  Chest x-ray 12/09/2013.  FINDINGS: Mediastinum hilar structures normal. Prior CABG. Stable cardiomegaly, normal pulmonary vascularity. Persistent right lower lobe infiltrate. No prominent pleural effusion. No pneumothorax. No acute osseous abnormality.  IMPRESSION: 1. Persistent right lower lobe infiltrate. 2. Prior CABG.  Mild cardiomegaly, no pulmonary venous congestion.   Electronically Signed   By: Marcello Moores  Register   On: 01/01/2014 10:25   Dg Chest 2 View  12/09/2013   CLINICAL DATA:  Shortness of breath.  Chest pain on the right.  EXAM: CHEST  2 VIEW  COMPARISON:  PA and lateral chest 06/14/2013 and 03/14/2013. CT chest 03/09/2013.  FINDINGS: The patient is status post CABG. Minimal basilar atelectasis is identified. The lungs are otherwise clear. Heart size is normal. No pneumothorax or pleural effusion.  IMPRESSION: No acute disease.   Electronically Signed   By: Inge Rise M.D.   On: 12/09/2013 11:54   Ct Angio Chest W/cm &/or Wo Cm  12/09/2013   CLINICAL DATA:  Shortness of breath.  EXAM: CT ANGIOGRAPHY CHEST WITH CONTRAST  TECHNIQUE: Multidetector CT imaging of the chest was performed using the standard protocol during bolus administration of intravenous contrast. Multiplanar CT image reconstructions and MIPs were obtained to evaluate the vascular anatomy.  CONTRAST:  144mL OMNIPAQUE IOHEXOL 350 MG/ML SOLN  COMPARISON:  12/09/2013.  FINDINGS: Thoracic aortic atherosclerotic vascular disease. Pulmonary arteries are normal. Cardiomegaly. Coronary artery disease. Prior CABG.  No significant mediastinal adenopathy. Sliding hiatal hernia with dilated esophagus. Esophagus contains fluid. Gastroesophageal reflux could present in this fashion. To evaluate for distal  obstructing lesion brain swallow should be considered.  Large airways are patent. Right lower lobe infiltrate consistent with  pneumonia.  Multiple hepatic lucencies are noted consistent with simple cysts. Diffuse degenerative changes thoracic spine.  No acute bony abnormality identified. Sclerotic density noted left upper posterior rib, unchanged.  Review of the MIP images confirms the above findings.  IMPRESSION: 1. Right lower lobe infiltrate consistent with pneumonia. 2. Sliding hiatal hernia with dilated thoracic esophagus. Thoracic esophagus contains fluid. Gastroesophageal reflux can present in this fashion. To evaluate for obstructing lesion, barium swallow should be considered. 3. Prior CABG.  Cardiomegaly.  No evidence of pulmonary embolus.   Electronically Signed   By: Marcello Moores  Register   On: 12/09/2013 13:25    Oren Binet, MD  [facility] Pager:[phone]  If 7PM-7AM, please contact night-coverage [email or web address] Password [email or web address] 01/02/2014, 2:13 PM   LOS: 1 day   **Disclaimer: This note may have been dictated with voice recognition software. Similar sounding words can inadvertently be transcribed and this note may contain transcription errors which may not have been corrected upon publication of note.**

## 2014-01-02 NOTE — Plan of Care (Signed)
Problem: Phase I Progression Outcomes Goal: Initial discharge plan identified Outcome: Completed/Met Date Met:  01/02/14 To return home with wife

## 2014-01-03 NOTE — Progress Notes (Signed)
PATIENT DETAILS Name: [name]. Age: 80 y.o. Sex: male Date of Birth: 08/23/1930 Admit Date: 01/01/2014 Admitting Physician Geradine Girt, DO DTO:[id number] [name], MD  Subjective: No major complaints  Assessment/Plan: Active Problems: Recurrent pneumonia - History of severe chronic asthma-now with numerous episodes of pneumonia this year. X-ray on admission shows right lower lobe pneumonia, recent CT angiogram of the chest done on 9/1 showed right lower lobe infiltrate as well. - Patient was admitted, started on empiric vancomycin and cefepime, significantly improved overnight, with decrease in shortness of breath and the right upper quadrant pain. Remains afebrile. Continue with empiric vancomycin and cefepime-Day 3.HIV negative. Await respiratory virus panel. Blood culture on 9/24 negative so far - Because of recurrent pneumonia-speech therapy evaluation obtained-MBS reviewed-suspect Reflux likely causing recurrent PNA-will . Apparently has a history of esophageal strictures/GERD-may have reflux causing the recurrent pneumonias.Will educate regarding lifestyle modifications, will need GI eval-but can be done as outpatient  Asthma exacerbation - Significantly improved- lungs with very minimal wheezing. Continue with nebulized bronchodilators, empiric antibiotics as above, and prednisone  Right upper quadrant pain - Suspect secondary to right lower lobe pneumonia. Patient is status post cholecystectomy in 2012. On exam-no right upper quadrant pain,abdomen is completely benign on exam.  GERD with history of esophageal stricture - Continue with PPI- see above  History of atrial fibrillation - Rate controlled - Continue with bisoprolol and Cardizem, on Eliquis for anticoagulation  History of hypertension - Controlled-continue with bisoprolol, clonidine, Cardizem, Bidil and losartan  History of coronary disease status post CABG and PCI - Stable. No chest pain. -  Continue with beta blocker and losartan.  Disposition: Remain inpatient-home in am  DVT Prophylaxis: On Eliquis  Code Status: Full code   Family Communication None at bedside  Procedures:  None  CONSULTS:  None  MEDICATIONS: Scheduled Meds: . albuterol  3 mL Inhalation TID  . apixaban  5 mg Oral BID  . bisoprolol  15 mg Oral Daily  . budesonide  0.25 mg Nebulization BID  . calcium-vitamin D  1 tablet Oral BID  . ceFEPime (MAXIPIME) IV  1 g Intravenous Q8H  . cholestyramine  4 g Oral Daily  . cloNIDine  0.3 mg Oral BID  . diltiazem  120 mg Oral BID  . ferrous sulfate  325 mg Oral Daily  . isosorbide-hydrALAZINE  1 tablet Oral TID  . latanoprost  1 drop Both Eyes QHS  . LORazepam  0.5 mg Oral BID  . losartan  100 mg Oral Daily  . montelukast  10 mg Oral QHS  . pantoprazole  40 mg Oral Daily  . potassium chloride SA  20 mEq Oral BID  . pravastatin  80 mg Oral q1800  . predniSONE  40 mg Oral Q breakfast  . vancomycin  750 mg Intravenous Q12H   Continuous Infusions:  PRN Meds:.albuterol, guaiFENesin-dextromethorphan, ipratropium-albuterol, torsemide  Antibiotics: Anti-infectives   Start     Dose/Rate Route Frequency Ordered Stop   01/02/14 0000  vancomycin (VANCOCIN) IVPB 750 mg/150 ml premix     750 mg 150 mL/hr over 60 Minutes Intravenous Every 12 hours 01/01/14 1140     01/01/14 2000  ceFEPIme (MAXIPIME) 1 g in dextrose 5 % 50 mL IVPB     1 g 100 mL/hr over 30 Minutes Intravenous Every 8 hours 01/01/14 1142     01/01/14 1230  vancomycin (VANCOCIN) IVPB 1000 mg/200 mL premix     1,000 mg  200 mL/hr over 60 Minutes Intravenous  Once 01/01/14 1140 01/01/14 1356   01/01/14 1200  ceFEPIme (MAXIPIME) 2 g in dextrose 5 % 50 mL IVPB  Status:  Discontinued     2 g 100 mL/hr over 30 Minutes Intravenous Every 8 hours 01/01/14 1137 01/01/14 1142   01/01/14 1200  ceFEPIme (MAXIPIME) 2 g in dextrose 5 % 50 mL IVPB     2 g 100 mL/hr over 30 Minutes Intravenous  Once  01/01/14 1142 01/01/14 1250       PHYSICAL EXAM: Vital signs in last 24 hours: Filed Vitals:   01/02/14 2159 01/02/14 2244 01/03/14 0145 01/03/14 0604  BP: 121/57 170/76 122/56 144/69  Pulse: 71 74  72  Temp: 97.8 F (36.6 C)   97.9 F (36.6 C)  TempSrc: Oral   Oral  Resp: 20   19  Height:      Weight:      SpO2: 98% 99%  99%    Weight change: -0.408 kg (-14.4 oz) Filed Weights   01/01/14 1408 01/02/14 1130  Weight: 67.087 kg (147 lb 14.4 oz) 66.679 kg (147 lb)   Body mass index is 26.88 kg/(m^2).   Gen Exam: Awake and alert with clear speech.   Neck: Supple, No JVD.  Chest: B/L Clear. Good air entry. CVS: S1 S2 Regular, no murmurs. Abdomen: soft, BS +, non tender, non distended.  Extremities: no edema, lower extremities warm to touch. Neurologic: Non Focal.   Skin: No Rash.   Wounds: N/A.    Intake/Output from previous day:  Intake/Output Summary (Last 24 hours) at 01/03/14 1107 Last data filed at 01/02/14 2212  Gross per 24 hour  Intake    960 ml  Output      0 ml  Net    960 ml     LAB RESULTS: CBC  Recent Labs Lab 01/01/14 0915  WBC 10.9*  HGB 13.3  HCT 39.2  PLT 157  MCV 89.9  MCH 30.5  MCHC 33.9  RDW 14.7  LYMPHSABS 1.2  MONOABS 0.9  EOSABS 0.0  BASOSABS 0.0    Chemistries   Recent Labs Lab 01/01/14 0915  NA 137  K 3.7  CL 101  CO2 24  GLUCOSE 94  BUN 13  CREATININE 0.77  CALCIUM 8.7    CBG: No results found for this basename: GLUCAP,  in the last 168 hours  GFR Estimated Creatinine Clearance: 61.9 ml/min (by C-G formula based on Cr of 0.77).  Coagulation profile No results found for this basename: INR, PROTIME,  in the last 168 hours  Cardiac Enzymes  Recent Labs Lab 01/01/14 0915  TROPONINI <0.30    No components found with this basename: POCBNP,  No results found for this basename: DDIMER,  in the last 72 hours No results found for this basename: HGBA1C,  in the last 72 hours No results found for this  basename: CHOL, HDL, LDLCALC, TRIG, CHOLHDL, LDLDIRECT,  in the last 72 hours No results found for this basename: TSH, T4TOTAL, FREET3, T3FREE, THYROIDAB,  in the last 72 hours No results found for this basename: VITAMINB12, FOLATE, FERRITIN, TIBC, IRON, RETICCTPCT,  in the last 72 hours  Recent Labs  01/01/14 0915  LIPASE 26    Urine Studies No results found for this basename: UACOL, UAPR, USPG, UPH, UTP, UGL, UKET, UBIL, UHGB, UNIT, UROB, ULEU, UEPI, UWBC, URBC, UBAC, CAST, CRYS, UCOM, BILUA,  in the last 72 hours  MICROBIOLOGY: Recent Results (from the past  240 hour(s))  CULTURE, BLOOD (ROUTINE X 2)     Status: None   Collection Time    01/01/14 12:00 PM      Result Value Ref Range Status   Specimen Description BLOOD LEFT WRIST   Final   Special Requests BOTTLES DRAWN AEROBIC AND ANAEROBIC 5CC   Final   Culture  Setup Time     Final   Value: 01/01/2014 15:54     Performed at [facility]   Culture     Final   Value:        BLOOD CULTURE RECEIVED NO GROWTH TO DATE CULTURE WILL BE HELD FOR 5 DAYS BEFORE ISSUING A FINAL NEGATIVE REPORT     Performed at [facility]   Report Status PENDING   Incomplete  CULTURE, BLOOD (ROUTINE X 2)     Status: None   Collection Time    01/01/14 12:05 PM      Result Value Ref Range Status   Specimen Description BLOOD LEFT ARM   Final   Special Requests BOTTLES DRAWN AEROBIC AND ANAEROBIC 5CC   Final   Culture  Setup Time     Final   Value: 01/01/2014 15:55     Performed at [facility]   Culture     Final   Value:        BLOOD CULTURE RECEIVED NO GROWTH TO DATE CULTURE WILL BE HELD FOR 5 DAYS BEFORE ISSUING A FINAL NEGATIVE REPORT     Performed at [facility]   Report Status PENDING   Incomplete    RADIOLOGY STUDIES/RESULTS: Dg Chest 2 View  01/01/2014   CLINICAL DATA:  Fever and chills.  History of pneumonia.  EXAM: CHEST  2 VIEW  COMPARISON:  CT 12/09/2013.  Chest x-ray 12/09/2013.  FINDINGS: Mediastinum  hilar structures normal. Prior CABG. Stable cardiomegaly, normal pulmonary vascularity. Persistent right lower lobe infiltrate. No prominent pleural effusion. No pneumothorax. No acute osseous abnormality.  IMPRESSION: 1. Persistent right lower lobe infiltrate. 2. Prior CABG.  Mild cardiomegaly, no pulmonary venous congestion.   Electronically Signed   By: Marcello Moores  Register   On: 01/01/2014 10:25   Dg Chest 2 View  12/09/2013   CLINICAL DATA:  Shortness of breath.  Chest pain on the right.  EXAM: CHEST  2 VIEW  COMPARISON:  PA and lateral chest 06/14/2013 and 03/14/2013. CT chest 03/09/2013.  FINDINGS: The patient is status post CABG. Minimal basilar atelectasis is identified. The lungs are otherwise clear. Heart size is normal. No pneumothorax or pleural effusion.  IMPRESSION: No acute disease.   Electronically Signed   By: Inge Rise M.D.   On: 12/09/2013 11:54   Ct Angio Chest W/cm &/or Wo Cm  12/09/2013   CLINICAL DATA:  Shortness of breath.  EXAM: CT ANGIOGRAPHY CHEST WITH CONTRAST  TECHNIQUE: Multidetector CT imaging of the chest was performed using the standard protocol during bolus administration of intravenous contrast. Multiplanar CT image reconstructions and MIPs were obtained to evaluate the vascular anatomy.  CONTRAST:  180mL OMNIPAQUE IOHEXOL 350 MG/ML SOLN  COMPARISON:  12/09/2013.  FINDINGS: Thoracic aortic atherosclerotic vascular disease. Pulmonary arteries are normal. Cardiomegaly. Coronary artery disease. Prior CABG.  No significant mediastinal adenopathy. Sliding hiatal hernia with dilated esophagus. Esophagus contains fluid. Gastroesophageal reflux could present in this fashion. To evaluate for distal obstructing lesion brain swallow should be considered.  Large airways are patent. Right lower lobe infiltrate consistent with pneumonia.  Multiple hepatic lucencies are noted  consistent with simple cysts. Diffuse degenerative changes thoracic spine.  No acute bony abnormality identified.  Sclerotic density noted left upper posterior rib, unchanged.  Review of the MIP images confirms the above findings.  IMPRESSION: 1. Right lower lobe infiltrate consistent with pneumonia. 2. Sliding hiatal hernia with dilated thoracic esophagus. Thoracic esophagus contains fluid. Gastroesophageal reflux can present in this fashion. To evaluate for obstructing lesion, barium swallow should be considered. 3. Prior CABG.  Cardiomegaly.  No evidence of pulmonary embolus.   Electronically Signed   By: Marcello Moores  Register   On: 12/09/2013 13:25    Oren Binet, MD  [facility] Pager:[phone]  If 7PM-7AM, please contact night-coverage [email or web address] Password [email or web address] 01/03/2014, 11:07 AM   LOS: 2 days   **Disclaimer: This note may have been dictated with voice recognition software. Similar sounding words can inadvertently be transcribed and this note may contain transcription errors which may not have been corrected upon publication of note.**

## 2014-01-04 MED ORDER — AMOXICILLIN-POT CLAVULANATE 875-125 MG PO TABS: 1.0000 | ORAL_TABLET | Freq: Two times a day (BID) | ORAL | Status: DC

## 2014-01-04 MED ORDER — CALCIUM CARBONATE ANTACID 500 MG PO CHEW
1.0000 | CHEWABLE_TABLET | Freq: Four times a day (QID) | ORAL | Status: DC | PRN
  Administered 2014-01-04: 200 mg via ORAL
  Filled 2014-01-04: qty 1

## 2014-01-04 MED ORDER — PREDNISONE 10 MG PO TABS: ORAL_TABLET | ORAL | Status: DC

## 2014-01-04 MED ORDER — PANTOPRAZOLE SODIUM 40 MG PO TBEC: 40.0000 mg | DELAYED_RELEASE_TABLET | Freq: Two times a day (BID) | ORAL | Status: DC

## 2014-01-04 NOTE — Discharge Instructions (Signed)
Gastroesophageal Reflux Disease, Adult °Gastroesophageal reflux disease (GERD) happens when acid from your stomach goes into your food pipe (esophagus). The acid can cause a burning feeling in your chest. Over time, the acid can make small holes (ulcers) in your food pipe.  °HOME CARE °· Ask your doctor for advice about: °¨ Losing weight. °¨ Quitting smoking. °¨ Alcohol use. °· Avoid foods and drinks that make your problems worse. You may want to avoid: °¨ Caffeine and alcohol. °¨ Chocolate. °¨ Mints. °¨ Garlic and onions. °¨ Spicy foods. °¨ Citrus fruits, such as oranges, lemons, or limes. °¨ Foods that contain tomato, such as sauce, chili, salsa, and pizza. °¨ Fried and fatty foods. °· Avoid lying down for 3 hours before you go to bed or before you take a nap. °· Eat small meals often, instead of large meals. °· Wear loose-fitting clothing. Do not wear anything tight around your waist. °· Raise (elevate) the head of your bed 6 to 8 inches with wood blocks. Using extra pillows does not help. °· Only take medicines as told by your doctor. °· Do not take aspirin or ibuprofen. °GET HELP RIGHT AWAY IF:  °· You have pain in your arms, neck, jaw, teeth, or back. °· Your pain gets worse or changes. °· You feel sick to your stomach (nauseous), throw up (vomit), or sweat (diaphoresis). °· You feel short of breath, or you pass out (faint). °· Your throw up is green, yellow, black, or looks like coffee grounds or blood. °· Your poop (stool) is red, bloody, or black. °MAKE SURE YOU:  °· Understand these instructions. °· Will watch your condition. °· Will get help right away if you are not doing well or get worse. °Document Released: 09/13/2007 Document Revised: 06/19/2011 Document Reviewed: 10/14/2010 °[employer]® Patient Information ©2015 [employer]. This information is not intended to replace advice given to you by your health care provider. Make sure you discuss any questions you have with your health care provider. ° °

## 2014-01-04 NOTE — Discharge Summary (Signed)
PATIENT DETAILS Name: [name]. Age: 80 y.o. Sex: male Date of Birth: [birth year] MRN: [id number]. Admitting Physician: Geradine Girt, DO IWL:[id number] [name], MD  Admit Date: 01/01/2014 Discharge date: 01/04/2014  Recommendations for Outpatient Follow-up:  1. Counsel regarding lifestyle modification regarding GERD-given recurrent PNA 2. Will need to referral to GI/Pulmonology if continues to have more episodes of PNA 3. Resp Virus Panel pending at the time of discharge-please follow 4. Please follow blood culture till final  PRIMARY DISCHARGE DIAGNOSIS:  Active Problems:   ESOPHAGEAL STRICTURE   Asthma exacerbation   Pneumonia      PAST MEDICAL HISTORY: Past Medical History  Diagnosis Date  . CORONARY HEART DISEASE     a. s/p CABG;  b.  cath 06/27/10: S-Dx occluded, S-PDA 80-90% (tx with PCI); S-OM ok, L-LAD ok;  EF 65-70%  c.  s/p Promus DES to S-PDA 06/2010;   d. Myoview 8/12: low risk  . FIBRILLATION, ATRIAL     post op; ?documented during hosp. 06/2010  . SLEEP APNEA 09/2001    NPSG AHI 22/HR  . ALLERGIC RHINITIS   . ASTHMA   . Depression   . GERD     with HH, hx esophageal stricture  . DYSLIPIDEMIA   . HYPERTENSION     Echo 3/12: EF 55-60%; mod LVH; mild AS/AI; LAE; PASP 38; mild pulmo HTN  . Personal history of alcoholism   . Diverticulosis   . Benign liver cyst   . Skin cancer     L forearm  . Cataract     surgery to both eyes  . Esophageal stricture   . Hiatal hernia   . Pneumonia 04/2013 hosp  . Shortness of breath     DISCHARGE MEDICATIONS:   Medication List    STOP taking these medications       omeprazole 20 MG capsule  Commonly known as:  PRILOSEC  Replaced by:  pantoprazole 40 MG tablet      TAKE these medications       albuterol 108 (90 BASE) MCG/ACT inhaler  Commonly known as:  PROVENTIL HFA;VENTOLIN HFA  Inhale 2 puffs into the lungs every 6 (six) hours as needed for wheezing or shortness of breath.     amoxicillin-clavulanate 875-125 MG per tablet  Commonly known as:  AUGMENTIN  Take 1 tablet by mouth 2 (two) times daily.     apixaban 5 MG Tabs tablet  Commonly known as:  ELIQUIS  Take 1 tablet (5 mg total) by mouth 2 (two) times daily.     bisoprolol 10 MG tablet  Commonly known as:  ZEBETA  Take 1.5 tablets (15 mg total) by mouth daily.     budesonide 0.25 MG/2ML nebulizer solution  Commonly known as:  PULMICORT  Take 0.25 mg by nebulization 2 (two) times daily as needed. For asthma     calcium-vitamin D 500-200 MG-UNIT per tablet  Commonly known as:  OSCAL WITH D  Take 1 tablet by mouth 2 (two) times daily.     cholestyramine 4 G packet  Commonly known as:  QUESTRAN  Take 4 g by mouth daily.     cloNIDine 0.3 MG tablet  Commonly known as:  CATAPRES  Take 1 tablet (0.3 mg total) by mouth 2 (two) times daily.     denosumab 60 MG/ML Soln injection  Commonly known as:  PROLIA  Inject 60 mg into the skin every 6 (six) months. Administer in upper arm, thigh, or abdomen  diltiazem 120 MG 24 hr capsule  Commonly known as:  CARDIZEM CD  Take 1 capsule (120 mg total) by mouth 2 (two) times daily.     ferrous sulfate 325 (65 FE) MG tablet  Take 325 mg by mouth daily.     ipratropium-albuterol 0.5-2.5 (3) MG/3ML Soln  Commonly known as:  DUONEB  Take 3 mLs by nebulization every 6 (six) hours as needed (for shortness of breath).     isosorbide-hydrALAZINE 20-37.5 MG per tablet  Commonly known as:  BIDIL  Take 1 tablet by mouth 3 (three) times daily.     latanoprost 0.005 % ophthalmic solution  Commonly known as:  XALATAN  Place 1 drop into both eyes at bedtime.     LORazepam 0.5 MG tablet  Commonly known as:  ATIVAN  Take 0.5 mg by mouth 2 (two) times daily.     losartan 100 MG tablet  Commonly known as:  COZAAR  Take 100 mg by mouth daily.     mometasone-formoterol 100-5 MCG/ACT Aero  Commonly known as:  DULERA  Inhale 2 puffs into the lungs 2 (two) times  daily.     montelukast 10 MG tablet  Commonly known as:  SINGULAIR  Take 10 mg by mouth at bedtime.     nitroGLYCERIN 0.4 MG SL tablet  Commonly known as:  NITROSTAT  Place 0.4 mg under the tongue every 5 (five) minutes as needed for chest pain.     pantoprazole 40 MG tablet  Commonly known as:  PROTONIX  Take 1 tablet (40 mg total) by mouth 2 (two) times daily.     PHILLIPS COLON HEALTH PO  Take 1 capsule by mouth daily.     potassium chloride SA 20 MEQ tablet  Commonly known as:  K-DUR,KLOR-CON  Take 20 mEq by mouth 2 (two) times daily.     pravastatin 80 MG tablet  Commonly known as:  PRAVACHOL  Take 80 mg by mouth every evening.     predniSONE 10 MG tablet  Commonly known as:  DELTASONE  - Take 4 tablets (40 mg) daily for 2 days, then,  - Take 3 tablets (30 mg) daily for 2 days, then,  - Take 2 tablets (20 mg) daily for 2 days, then,  - Take 1 tablets (10 mg) daily for 1 days, then stop     torsemide 20 MG tablet  Commonly known as:  DEMADEX  Take 20 mg by mouth daily as needed (for fluid).        ALLERGIES:   Allergies  Allergen Reactions  . Ace Inhibitors Other (See Comments)    Severe asthma (COPD)  . Codeine Nausea And Vomiting  . Lasix [Furosemide] Itching    BRIEF HPI:  See H&P, Labs, Consult and Test reports for all details in brief, patient is a 80 y.o. male with hx of recent PNA/GERD-admitted for fever,cough and SOB.Patient was admitted for further evaluation and treatment  CONSULTATIONS:   None  PERTINENT RADIOLOGIC STUDIES: Dg Chest 2 View  01/01/2014   CLINICAL DATA:  Fever and chills.  History of pneumonia.  EXAM: CHEST  2 VIEW  COMPARISON:  CT 12/09/2013.  Chest x-ray 12/09/2013.  FINDINGS: Mediastinum hilar structures normal. Prior CABG. Stable cardiomegaly, normal pulmonary vascularity. Persistent right lower lobe infiltrate. No prominent pleural effusion. No pneumothorax. No acute osseous abnormality.  IMPRESSION: 1. Persistent right  lower lobe infiltrate. 2. Prior CABG.  Mild cardiomegaly, no pulmonary venous congestion.   Electronically Signed   By:  [place]   On: 01/01/2014 10:25   Dg Chest 2 View  12/09/2013   CLINICAL DATA:  Shortness of breath.  Chest pain on the right.  EXAM: CHEST  2 VIEW  COMPARISON:  PA and lateral chest 06/14/2013 and 03/14/2013. CT chest 03/09/2013.  FINDINGS: The patient is status post CABG. Minimal basilar atelectasis is identified. The lungs are otherwise clear. Heart size is normal. No pneumothorax or pleural effusion.  IMPRESSION: No acute disease.   Electronically Signed   By: Inge Rise M.D.   On: 12/09/2013 11:54   Ct Angio Chest W/cm &/or Wo Cm  12/09/2013   CLINICAL DATA:  Shortness of breath.  EXAM: CT ANGIOGRAPHY CHEST WITH CONTRAST  TECHNIQUE: Multidetector CT imaging of the chest was performed using the standard protocol during bolus administration of intravenous contrast. Multiplanar CT image reconstructions and MIPs were obtained to evaluate the vascular anatomy.  CONTRAST:  177mL OMNIPAQUE IOHEXOL 350 MG/ML SOLN  COMPARISON:  12/09/2013.  FINDINGS: Thoracic aortic atherosclerotic vascular disease. Pulmonary arteries are normal. Cardiomegaly. Coronary artery disease. Prior CABG.  No significant mediastinal adenopathy. Sliding hiatal hernia with dilated esophagus. Esophagus contains fluid. Gastroesophageal reflux could present in this fashion. To evaluate for distal obstructing lesion brain swallow should be considered.  Large airways are patent. Right lower lobe infiltrate consistent with pneumonia.  Multiple hepatic lucencies are noted consistent with simple cysts. Diffuse degenerative changes thoracic spine.  No acute bony abnormality identified. Sclerotic density noted left upper posterior rib, unchanged.  Review of the MIP images confirms the above findings.  IMPRESSION: 1. Right lower lobe infiltrate consistent with pneumonia. 2. Sliding hiatal hernia with dilated thoracic  esophagus. Thoracic esophagus contains fluid. Gastroesophageal reflux can present in this fashion. To evaluate for obstructing lesion, barium swallow should be considered. 3. Prior CABG.  Cardiomegaly.  No evidence of pulmonary embolus.   Electronically Signed   By: Marcello Moores  Register   On: 12/09/2013 13:25   Dg Swallowing Func-speech Pathology  01/02/2014   Orbie Pyo [place], CCC-SLP     01/02/2014  3:12 PM Objective Swallowing Evaluation: Modified Barium Swallowing Study   Patient Details  Name: [name]. MRN: [id number] Date of Birth: 1930/04/28  Today's Date: 01/02/2014 Time: 1340-1400 SLP Time Calculation (min): 20 min  Past Medical History:  Past Medical History  Diagnosis Date  . CORONARY HEART DISEASE     a. s/p CABG;  b.  cath 06/27/10: S-Dx occluded, S-PDA 80-90% (tx  with PCI); S-OM ok, L-LAD ok;  EF 65-70%  c.  s/p Promus DES to  S-PDA 06/2010;   d. Myoview 8/12: low risk  . FIBRILLATION, ATRIAL     post op; ?documented during hosp. 06/2010  . SLEEP APNEA 09/2001    NPSG AHI 22/HR  . ALLERGIC RHINITIS   . ASTHMA   . Depression   . GERD     with HH, hx esophageal stricture  . DYSLIPIDEMIA   . HYPERTENSION     Echo 3/12: EF 55-60%; mod LVH; mild AS/AI; LAE; PASP 38; mild  pulmo HTN  . Personal history of alcoholism   . Diverticulosis   . Benign liver cyst   . Skin cancer     L forearm  . Cataract     surgery to both eyes  . Esophageal stricture   . Hiatal hernia   . Pneumonia 04/2013 hosp  . Shortness of breath    Past Surgical History:  Past Surgical History  Procedure Laterality Date  .  Coronary artery bypass graft  02/2007  . Hernia repair  12/03/07  . Tonsillectomy    . Coronary angioplasty with stent placement  07/2010  . Cataract extraction, bilateral  2007  . Hernia repair  unsure ?60's  . Cholecystectomy  02/21/2011    Procedure: LAPAROSCOPIC CHOLECYSTECTOMY WITH INTRAOPERATIVE  CHOLANGIOGRAM;  Surgeon: Pedro Earls, MD;  Location: [facility];   Service: General;  Laterality: N/A;   HPI:  80 y.o.  with PMH:  CAD, A-fob, asthma, GERD, esophageal  stricture, hiatal hernia, pna admitted with right upper quadrant  pain, cough, fever.  Recent admission 12/09/13 with pna.  Chest x  ray showed right sided PNA- persistant, mild LE.  MD questions  aspiraiotn pna.  CT showed RRL infiltrate consistent with pna,  sliding hernia with dilated thoracic esophagus.  Bedside swallow  06/2013 resulted in functional swallow with regular diet/thin  liquids recommeded.     Assessment / Plan / Recommendation Clinical Impression  Dysphagia Diagnosis: Mild pharyngeal phase dysphagia Clinical impression: Pt. exhibited a mild motor based pharyngeal  dysphagia due to decreased laryngeal elevation and laryngeal  closure resulting in min-mild silent penetration with thin  barium.  A chin tuck posture decreased penetration to flash (in  and out of laryngeal vestibule).  Recurrent pna's may be due to  combination of pharyngeal as well as his diagnosed small hiatal  hernia and dilated thoracic esophagus diagnosed during chest CT  12/09/13.  SLP recommends continuing a regular diet and thin  liquids with chin tuck (liquids only) and esophageal precautions  with ST follow up for reiteration of strategies.       Treatment Recommendation  Therapy as outlined in treatment plan below    Diet Recommendation Regular;Thin liquid   Liquid Administration via: Cup;No straw Medication Administration: Whole meds with puree Supervision: Patient able to self feed Compensations: Slow rate;Small sips/bites Postural Changes and/or Swallow Maneuvers: Chin tuck;Seated  upright 90 degrees;Upright 30-60 min after meal    Other  Recommendations Oral Care Recommendations: Oral care BID   Follow Up Recommendations  None    Frequency and Duration min 1 x/week  2 weeks   Pertinent Vitals/Pain No pain           Reason for Referral Objectively evaluate swallowing function   Oral Phase Oral Preparation/Oral Phase Oral Phase: Impaired Oral - Nectar Oral - Nectar Teaspoon:  Lingual/palatal residue Oral - Nectar Cup: Not tested   Pharyngeal Phase Pharyngeal Phase Pharyngeal Phase: Impaired Pharyngeal - Nectar Pharyngeal - Nectar Teaspoon: Within functional limits Pharyngeal - Thin Pharyngeal - Thin Teaspoon: Within functional limits Pharyngeal - Thin Cup: Reduced laryngeal elevation;Reduced  airway/laryngeal closure;Penetration/Aspiration during swallow Penetration/Aspiration details (thin cup): Material enters  airway, remains ABOVE vocal cords and not ejected out Pharyngeal - Solids Pharyngeal - Regular: Within functional limits  Cervical Esophageal Phase    GO    Cervical Esophageal Phase Cervical Esophageal Phase: Darryll Capers         [place] Siren 01/02/2014, 3:11 PM  Orbie Pyo Colvin Caroli.Ed CCC-SLP Pager [phone]      PERTINENT LAB RESULTS: CBC: No results found for this basename: WBC, HGB, HCT, PLT,  in the last 72 hours CMET CMP     Component Value Date/Time   NA 137 01/01/2014 0915   K 3.7 01/01/2014 0915   CL 101 01/01/2014 0915   CO2 24 01/01/2014 0915   GLUCOSE 94 01/01/2014 0915   BUN 13 01/01/2014 0915   CREATININE 0.77 01/01/2014 0915  CALCIUM 8.7 01/01/2014 0915   PROT 6.2 01/01/2014 0915   ALBUMIN 3.8 01/01/2014 0915   AST 19 01/01/2014 0915   ALT 21 01/01/2014 0915   ALKPHOS 69 01/01/2014 0915   BILITOT 0.6 01/01/2014 0915   GFRNONAA 83* 01/01/2014 0915   GFRAA >90 01/01/2014 0915    GFR Estimated Creatinine Clearance: 61.9 ml/min (by C-G formula based on Cr of 0.77). No results found for this basename: LIPASE, AMYLASE,  in the last 72 hours No results found for this basename: CKTOTAL, CKMB, CKMBINDEX, TROPONINI,  in the last 72 hours No components found with this basename: POCBNP,  No results found for this basename: DDIMER,  in the last 72 hours No results found for this basename: HGBA1C,  in the last 72 hours No results found for this basename: CHOL, HDL, LDLCALC, TRIG, CHOLHDL, LDLDIRECT,  in the last 72 hours No results found for this basename:  TSH, T4TOTAL, FREET3, T3FREE, THYROIDAB,  in the last 72 hours No results found for this basename: VITAMINB12, FOLATE, FERRITIN, TIBC, IRON, RETICCTPCT,  in the last 72 hours Coags: No results found for this basename: PT, INR,  in the last 72 hours Microbiology: Recent Results (from the past 240 hour(s))  CULTURE, BLOOD (ROUTINE X 2)     Status: None   Collection Time    01/01/14 12:00 PM      Result Value Ref Range Status   Specimen Description BLOOD LEFT WRIST   Final   Special Requests BOTTLES DRAWN AEROBIC AND ANAEROBIC 5CC   Final   Culture  Setup Time     Final   Value: 01/01/2014 15:54     Performed at [facility]   Culture     Final   Value:        BLOOD CULTURE RECEIVED NO GROWTH TO DATE CULTURE WILL BE HELD FOR 5 DAYS BEFORE ISSUING A FINAL NEGATIVE REPORT     Performed at [facility]   Report Status PENDING   Incomplete  CULTURE, BLOOD (ROUTINE X 2)     Status: None   Collection Time    01/01/14 12:05 PM      Result Value Ref Range Status   Specimen Description BLOOD LEFT ARM   Final   Special Requests BOTTLES DRAWN AEROBIC AND ANAEROBIC 5CC   Final   Culture  Setup Time     Final   Value: 01/01/2014 15:55     Performed at [facility]   Culture     Final   Value:        BLOOD CULTURE RECEIVED NO GROWTH TO DATE CULTURE WILL BE HELD FOR 5 DAYS BEFORE ISSUING A FINAL NEGATIVE REPORT     Performed at [facility]   Report Status PENDING   Incomplete     [place]:   Active Problems: Recurrent pneumonia  - History of severe chronic asthma-now with numerous episodes of pneumonia this year. X-ray on admission shows right lower lobe pneumonia, recent CT angiogram of the chest done on 9/1 showed right lower lobe infiltrate as well (but no structural pathology) -Patient was admitted, started on empiric vancomycin and cefepime, significantly improved since admission, with decrease in shortness of breath and the right upper quadrant  pain. Remains afebrile since admission.HIV negative. Blood cultures negative so far, since clinically improved will be discharged, will transition to Augmentin on discharge. -Because of recurrent pneumonia-speech therapy evaluation obtained-MBS reviewed-suspect Reflux likely causing recurrent PNA-will . Apparently has a  history of esophageal strictures/GERD-may have reflux causing the recurrent pneumonias.Have educated regarding lifestyle modifications (literature provided to patient), will need GI eval-but can be done as outpatient   Asthma exacerbation -admitted-given Steroids,nebs-significantly improved. Lungs clear, will discharge on tapering prednisone. Continue with prior inhaler regimen  Right upper quadrant pain  - Suspect secondary to right lower lobe pneumonia. Patient is status post cholecystectomy in 2012. On exam-no right upper quadrant pain,abdomen is completely benign on exam.Pain has completely resolved at time of discharge.  GERD with history of esophageal stricture  - Continue with PPI on discharge-will change to BID- see above   History of atrial fibrillation  - Rate controlled  - Continue with bisoprolol and Cardizem, on Eliquis for anticoagulation   History of hypertension  - Controlled-continue with bisoprolol, clonidine, Cardizem, Bidil and losartan   History of coronary disease status post CABG and PCI  - Stable. No chest pain.  - Continue with beta blocker and losartan.  TODAY-DAY OF DISCHARGE:  Subjective:   [name] today has no headache,no chest abdominal pain,no new weakness tingling or numbness, feels much better wants to go home today.   Objective:   Blood pressure 173/64, pulse 71, temperature 97.8 F (36.6 C), temperature source Oral, resp. rate 18, height 5\' 2"  (1.575 m), weight 66.679 kg (147 lb), SpO2 97.00%.  Intake/Output Summary (Last 24 hours) at 01/04/14 1054 Last data filed at 01/04/14 0436  Gross per 24 hour  Intake   1270 ml  Output       0 ml  Net   1270 ml   Filed Weights   01/01/14 1408 01/02/14 1130  Weight: 67.087 kg (147 lb 14.4 oz) 66.679 kg (147 lb)    Exam Awake Alert, Oriented *3, No new F.N deficits, Normal affect [place].AT,PERRAL Supple Neck,No JVD, No cervical lymphadenopathy appriciated.  Symmetrical Chest wall movement, Good air movement bilaterally, CTAB RRR,No Gallops,Rubs or new Murmurs, No Parasternal Heave +ve B.Sounds, Abd Soft, Non tender, No organomegaly appriciated, No rebound -guarding or rigidity. No Cyanosis, Clubbing or edema, No new Rash or bruise  DISCHARGE CONDITION: Stable  DISPOSITION: Home  DISCHARGE INSTRUCTIONS:    Activity:  As tolerated   Diet recommendation: Heart Healthy diet      Discharge Instructions   Call MD for:  difficulty breathing, headache or visual disturbances    Complete by:  As directed      Diet - low sodium heart healthy    Complete by:  As directed      Increase activity slowly    Complete by:  As directed            Follow-up Information   Follow up with Gwendolyn Grant, MD. Schedule an appointment as soon as possible for a visit in 1 week.   Specialty:  Internal Medicine   Contact information:   [address] [place] [address] [phone]       Follow up with Deneise Lever, MD. Schedule an appointment as soon as possible for a visit in 2 weeks.   Specialty:  Pulmonary Disease   Contact information:   [address]  [facility] [place] [address] [phone]       Follow up with Scarlette Shorts, MD. Schedule an appointment as soon as possible for a visit in 2 weeks.   Specialty:  Gastroenterology   Contact information:   [address] [place] [address] [phone]  Total Time spent on discharge equals 45 minutes.  SignedOren Binet 01/04/2014 10:54 AM  **Disclaimer: This note may have been dictated with voice recognition software. Similar  sounding words can inadvertently be transcribed and this note may contain transcription errors which may not have been corrected upon publication of note.**

## 2014-01-04 NOTE — Progress Notes (Signed)
01/04/14 Patient discharged home today, IV sites removed, and discharge instructions reviewed with patient.

## 2014-01-04 NOTE — Progress Notes (Signed)
ANTIBIOTIC CONSULT NOTE - FOLLOW UP  Pharmacy Consult for vancomycin and cefepime Indication: HCAP  Allergies  Allergen Reactions  . Ace Inhibitors Other (See Comments)    Severe asthma (COPD)  . Codeine Nausea And Vomiting  . Lasix [Furosemide] Itching    Patient Measurements: Height: 5\' 2"  (157.5 cm) Weight: 147 lb (66.679 kg) IBW/kg (Calculated) : 54.6  Vital Signs: Temp: 97.8 F (36.6 C) (09/27 0517) Temp src: Oral (09/27 0517) BP: 175/57 mmHg (09/27 0517) Pulse Rate: 71 (09/27 0517) Intake/Output from previous day: 09/26 0701 - 09/27 0700 In: 7902 [P.O.:1545; IV Piggyback:250] Out: -  Intake/Output from this shift:    Labs:  Recent Labs  01/01/14 0915  WBC 10.9*  HGB 13.3  PLT 157  CREATININE 0.77   Estimated Creatinine Clearance: 61.9 ml/min (by C-G formula based on Cr of 0.77). No results found for this basename: VANCOTROUGH, VANCOPEAK, VANCORANDOM, [place], [place], [place], [place], [place], TOBRARND, AMIKACINPEAK, AMIKACINTROU, AMIKACIN,  in the last 72 hours   Microbiology: Recent Results (from the past 720 hour(s))  CULTURE, BLOOD (ROUTINE X 2)     Status: None   Collection Time    12/09/13 11:01 AM      Result Value Ref Range Status   Specimen Description BLOOD ARM RIGHT   Final   Special Requests BOTTLES DRAWN AEROBIC AND ANAEROBIC 5ML   Final   Culture  Setup Time     Final   Value: 12/09/2013 16:14     Performed at [facility]   Culture     Final   Value: NO GROWTH 5 DAYS     Performed at [facility]   Report Status 12/15/2013 FINAL   Final  CULTURE, BLOOD (ROUTINE X 2)     Status: None   Collection Time    12/09/13 11:10 AM      Result Value Ref Range Status   Specimen Description BLOOD RIGHT ANTECUBITAL   Final   Special Requests BOTTLES DRAWN AEROBIC AND ANAEROBIC 10ML   Final   Culture  Setup Time     Final   Value: 12/09/2013 16:13     Performed at [facility]   Culture     Final   Value:  NO GROWTH 5 DAYS     Performed at [facility]   Report Status 12/15/2013 FINAL   Final  CULTURE, BLOOD (ROUTINE X 2)     Status: None   Collection Time    01/01/14 12:00 PM      Result Value Ref Range Status   Specimen Description BLOOD LEFT WRIST   Final   Special Requests BOTTLES DRAWN AEROBIC AND ANAEROBIC 5CC   Final   Culture  Setup Time     Final   Value: 01/01/2014 15:54     Performed at [facility]   Culture     Final   Value:        BLOOD CULTURE RECEIVED NO GROWTH TO DATE CULTURE WILL BE HELD FOR 5 DAYS BEFORE ISSUING A FINAL NEGATIVE REPORT     Performed at [facility]   Report Status PENDING   Incomplete  CULTURE, BLOOD (ROUTINE X 2)     Status: None   Collection Time    01/01/14 12:05 PM      Result Value Ref Range Status   Specimen Description BLOOD LEFT ARM   Final   Special Requests BOTTLES DRAWN AEROBIC AND ANAEROBIC 5CC   Final   Culture  Setup Time     Final   Value: 01/01/2014 15:55     Performed at [facility]   Culture     Final   Value:        BLOOD CULTURE RECEIVED NO GROWTH TO DATE CULTURE WILL BE HELD FOR 5 DAYS BEFORE ISSUING A FINAL NEGATIVE REPORT     Performed at [facility]   Report Status PENDING   Incomplete    Anti-infectives   Start     Dose/Rate Route Frequency Ordered Stop   01/02/14 0000  vancomycin (VANCOCIN) IVPB 750 mg/150 ml premix     750 mg 150 mL/hr over 60 Minutes Intravenous Every 12 hours 01/01/14 1140     01/01/14 2000  ceFEPIme (MAXIPIME) 1 g in dextrose 5 % 50 mL IVPB     1 g 100 mL/hr over 30 Minutes Intravenous Every 8 hours 01/01/14 1142     01/01/14 1230  vancomycin (VANCOCIN) IVPB 1000 mg/200 mL premix     1,000 mg 200 mL/hr over 60 Minutes Intravenous  Once 01/01/14 1140 01/01/14 1356   01/01/14 1200  ceFEPIme (MAXIPIME) 2 g in dextrose 5 % 50 mL IVPB  Status:  Discontinued     2 g 100 mL/hr over 30 Minutes Intravenous Every 8 hours 01/01/14 1137 01/01/14 1142   01/01/14  1200  ceFEPIme (MAXIPIME) 2 g in dextrose 5 % 50 mL IVPB     2 g 100 mL/hr over 30 Minutes Intravenous  Once 01/01/14 1142 01/01/14 1250      Assessment: 82 yoM on vancomycin and cefepime for HCAP (Day 4). Patient was previously diagnosed with PNA several weeks ago.   MD suspects reflux is causing recurrent PNA.  Today he is afebrile and has no complaints (No new labs).  MD plans to D/C patient home today with oral antibiotics.  Goal of Therapy:  Vancomycin trough level 15-20 mcg/ml  Plan:  - Continue vancomycin 750mg  IV q12h - Continue cefepime 1gm IV q8h - Follow-up cultures and transition to oral antibiotics at discharge   Theron Arista, PharmD Clinical Pharmacist - Resident Pager: [phone] 9/27/20159:04 AM

## 2014-01-05 ENCOUNTER — Telehealth: Payer: Self-pay | Admitting: *Deleted

## 2014-01-05 ENCOUNTER — Telehealth: Payer: Self-pay | Admitting: Internal Medicine

## 2014-01-05 LAB — RESPIRATORY VIRUS PANEL
Adenovirus: NOT DETECTED
INFLUENZA A: NOT DETECTED
INFLUENZA B 1: NOT DETECTED
Influenza A H1: NOT DETECTED
Influenza A H3: NOT DETECTED
Metapneumovirus: NOT DETECTED
PARAINFLUENZA 1 A: NOT DETECTED
Parainfluenza 2: NOT DETECTED
Parainfluenza 3: NOT DETECTED
RESPIRATORY SYNCYTIAL VIRUS B: NOT DETECTED
RHINOVIRUS: NOT DETECTED
Respiratory Syncytial Virus A: NOT DETECTED

## 2014-01-05 NOTE — Telephone Encounter (Signed)
Per CY-lets have patient come in on Thursday 01-08-14 at 2:45pm slot for HFU. Thanks.

## 2014-01-05 NOTE — Telephone Encounter (Signed)
Pt was just discharged from the hospital where he was in for pneumonia. Pts wife states they were told to follow up with GI because they think he is having reflux that is being aspirated. Pt scheduled to see Tye Savoy NP 01/07/14@3pm . Pt aware of appt.

## 2014-01-05 NOTE — Telephone Encounter (Signed)
Transition Care Management Follow-up Telephone Call d/c on 01/04/14 How have you been since you were released from the hospital? Pt stated he is feeling a little better   Do you understand why you were in the hospital? YES, wife stated they understood why he was admitted   Do you understand the discharge instrcutions? YES, we went over discharge summary  Items Reviewed:  Medications reviewed: YES, reviewed medications pt is taking everything as rx  Allergies reviewed: YES, updated  Dietary changes reviewed: YES, heart healthy  Referrals reviewed: No referral needed   Functional Questionnaire:   Activities of Daily Living (ADLs):   Wife state he is  independent in the following: bathing and hygiene, feeding & dressing himself, walking She states he doesn't require assistance    Any transportation issues/concerns?: NO   Any patient concerns? NO   Confirmed importance and date/time of follow-up visits scheduled: YES, Appt made for 01/07/14 with Dr. Linna Darner advise pt/wife to keep appt   Confirmed with patient if condition begins to worsen call PCP or go to the ER.  Patient was given the Call-a-Nurse line [phone]: YES

## 2014-01-05 NOTE — Telephone Encounter (Signed)
Spoke with Rod Holler, patient's [facility] Scheduled appt for Oct 1 at 245 for HFU Nothing further needed.

## 2014-01-05 NOTE — Telephone Encounter (Signed)
Katie working on time 

## 2014-01-05 NOTE — Telephone Encounter (Signed)
Per hospital d/c 01/04/14: Follow up with Deneise Lever, MD. Schedule an appointment as soon as possible for a visit in 2 weeks.    Please advise where pt can be worked in at? thanks

## 2014-01-06 ENCOUNTER — Telehealth: Payer: Self-pay | Admitting: Cardiology

## 2014-01-06 NOTE — Telephone Encounter (Signed)
Pt. States yesterday his BP was 198/88 today it is 214/134 I instructed pt to take another 50 mg of losartan and call me back around 4:30 pm and let me know if it comes down, pt. Stated understanding of instructions

## 2014-01-07 ENCOUNTER — Encounter: Payer: Self-pay | Admitting: Nurse Practitioner

## 2014-01-07 ENCOUNTER — Encounter: Payer: Self-pay | Admitting: Internal Medicine

## 2014-01-07 ENCOUNTER — Ambulatory Visit (INDEPENDENT_AMBULATORY_CARE_PROVIDER_SITE_OTHER): Payer: Medicare Other | Admitting: Nurse Practitioner

## 2014-01-07 ENCOUNTER — Ambulatory Visit (INDEPENDENT_AMBULATORY_CARE_PROVIDER_SITE_OTHER): Payer: Medicare Other | Admitting: Internal Medicine

## 2014-01-07 ENCOUNTER — Telehealth: Payer: Self-pay | Admitting: Cardiology

## 2014-01-07 VITALS — BP 190/80 | HR 80 | Temp 98.2°F | Resp 12 | Wt 143.5 lb

## 2014-01-07 VITALS — BP 180/78 | HR 76 | Ht 62.0 in | Wt 143.6 lb

## 2014-01-07 DIAGNOSIS — K219 Gastro-esophageal reflux disease without esophagitis: Secondary | ICD-10-CM

## 2014-01-07 DIAGNOSIS — I10 Essential (primary) hypertension: Secondary | ICD-10-CM

## 2014-01-07 DIAGNOSIS — R933 Abnormal findings on diagnostic imaging of other parts of digestive tract: Secondary | ICD-10-CM

## 2014-01-07 DIAGNOSIS — J690 Pneumonitis due to inhalation of food and vomit: Secondary | ICD-10-CM

## 2014-01-07 DIAGNOSIS — E785 Hyperlipidemia, unspecified: Secondary | ICD-10-CM

## 2014-01-07 LAB — CULTURE, BLOOD (ROUTINE X 2)
CULTURE: NO GROWTH
Culture: NO GROWTH

## 2014-01-07 MED ORDER — DILTIAZEM HCL ER COATED BEADS 240 MG PO CP24: 240.0000 mg | ORAL_CAPSULE | Freq: Every day | ORAL | Status: DC

## 2014-01-07 NOTE — Telephone Encounter (Signed)
Returned call to patient's wife.She stated husband's B/P continues to be elevated ranging 219/104,212/83,214/103,203/93,205/90.Pulse [phone] bpm.Stated patient has been in hospital recently with pneumonia.Stated he has had pneumonia 3 times this year.Stated every time he is in hospital B/P is elevated.Spoke to DOD Dr.Croitoru he advised to increase Cardizem 240 mg twice a day.Advised take another 120 mg now then 240 mg tonight.Advised to continue to monitor B/P and call back if continues to be elevated or is dizzy or feels light headed.

## 2014-01-07 NOTE — Patient Instructions (Addendum)
You have been scheduled for a Barium Esophogram at [facility] (1st floor of the hospital) on 01/08/14 at 10:30 am. Please arrive 15 minutes prior to your appointment for registration. Make certain not to have anything to eat or drink 6 hours prior to your test. If you need to reschedule for any reason, please contact radiology at [phone] to do so. __________________________________________________________________ A barium swallow is an examination that concentrates on views of the esophagus. This tends to be a double contrast exam (barium and two liquids which, when combined, create a gas to distend the wall of the oesophagus) or single contrast (non-ionic iodine based). The study is usually tailored to your symptoms so a good history is essential. Attention is paid during the study to the form, structure and configuration of the esophagus, looking for functional disorders (such as aspiration, dysphagia, achalasia, motility and reflux) EXAMINATION You may be asked to change into a gown, depending on the type of swallow being performed. A radiologist and radiographer will perform the procedure. The radiologist will advise you of the type of contrast selected for your procedure and direct you during the exam. You will be asked to stand, sit or lie in several different positions and to hold a small amount of fluid in your mouth before being asked to swallow while the imaging is performed .In some instances you may be asked to swallow barium coated marshmallows to assess the motility of a solid food bolus. The exam can be recorded as a digital or video fluoroscopy procedure. POST PROCEDURE It will take 1-2 days for the barium to pass through your system. To facilitate this, it is important, unless otherwise directed, to increase your fluids for the next 24-48hrs and to resume your normal diet.  This test typically takes about 30 minutes to  perform. __________________________________________________________________________________

## 2014-01-07 NOTE — Progress Notes (Signed)
   Subjective:    Patient ID: Stephen Door., male    DOB: Jul 08, 1930, 80 y.o.   MRN: [id number]  HPI   He was hospitalized 9/24-9/27/15 with recurrent pneumonia in the setting of esophageal stricture and recurrent aspiration.  Follow up of the blood culture respiratory viral panel was recommended. Both are negative  He is completing his steroids and the Augmentin.  His swallowing test was abnormal. He has a barium motility study scheduled tomorrow.  He also has a followup with Dr. Annamaria Boots, his pulmonologist 10/1.  His blood pressure earlier  was 214/100. He contacted his cardiologist & was told to increase his Cardizem to 240 mg twice a day. He has not initiated this as yet  Review of the labs demonstrated an LDL of 37 on pravastatin 80 mg.  At this time his major symptoms are severe reflux particularly after the evening meal. He states this mimics angina. He'll take nitroglycerin. If he gets no relief he will take up to 5 Rolaids which does help    Review of Systems  He denies any dysphagia at this time. He has no dyspnea, palpitations, or paroxysmal nocturnal dyspnea  He has no fever, chills, or sweats. At this time he has no purulent sputum.      Objective:   Physical Exam   Positive pertinent findings include: He appears much younger than stated age. He is very Physiological scientist He has pattern alopecia; he has a beard and mustache He has an upper dental plate. The mandible is edentulous He has bilateral hearing aids There is a grade 9.0-2 systolic murmur with neck radiation versus carotid bruits Minor rales are noted on auscultation of the chest The thoracic curvature is accentuated. 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  General appearance :adequately nourished; in no distress. Eyes: No conjunctival inflammation or scleral icterus is present. Oral exam: Lips and gums are healthy  appearing.There is no oropharyngeal erythema or exudate noted.  Heart:  Normal rate and regular rhythm. S1 and S2 normal without gallop, click, rub or other extra sounds   Lungs :No increased work of breathing.  Abdomen: bowel sounds normal, soft and non-tender without masses, organomegaly or hernias noted.  No guarding or rebound.  Vascular : all pulses equal Skin:Warm & dry.  Intact without suspicious lesions or rashes ; no jaundice or tenting Lymphatic: No lymphadenopathy is noted about the head, neck, axilla           Assessment & Plan:   #1 recurrent pneumonia in the setting of esophageal stricture. The latter will be assessed with imaging 10/1 At this time he is no evidence of active infection.  #2 dyslipidemia; he has a profoundly low LDL of 37 on a high dose of pravastatin. Perhaps he is a candidate for low-dose statin such as atorvastatin 20. This will be discussed with his cardiologist.  #3 uncontrolled hypertension. His calcium channel blocker has been increased to 240 mg twice a day but this has not been initiated. But has been reinforced

## 2014-01-07 NOTE — Progress Notes (Signed)
History of Present Illness:  Patient is an 80 year old male known to Dr. Henrene Pastor. He has multiple, significant medical problems and is on multiple medications including Apixaban.  Patient was diagnosed with RLL PNA by chest CTA on 12/09/13. After a course of antibiotics he returned to the emergency room for right upper quadrant pain, cough and fever. Chest x-ray showed persistent right sided pneumonia and a dilated, fluid filled esophagus. There was concern for aspiration as cause of  Pneumonia. Patient had a modified barium swallow study which revealed mild pharyngeal phase dysphagia and mild silent aspiration.  Patient has been doing chin tucks with thin liquids. Hospitalist recommended follow up with [place].   Patient has no solid food dysphagia. No unexpected weight loss. He was having belching, bloating and chest tightness, especially at night until making some lifestyle modifications. Patient goes to bed on an empty stomach and GERD symptoms have improved. He is on BID PPI. Last EGD was in 2009 with findings of a hiatal hernia and partial stricture.  Current Medications, Allergies, Past Medical History, Past Surgical History, Family History and Social History were reviewed in [employer] record.  Studies:   Ct Angio Chest W/cm &/or Wo Cm  12/09/2013   CLINICAL DATA:  Shortness of breath.  EXAM: CT ANGIOGRAPHY CHEST WITH CONTRAST  TECHNIQUE: Multidetector CT imaging of the chest was performed using the standard protocol during bolus administration of intravenous contrast. Multiplanar CT image reconstructions and MIPs were obtained to evaluate the vascular anatomy.  CONTRAST:  197mL OMNIPAQUE IOHEXOL 350 MG/ML SOLN  COMPARISON:  12/09/2013.  FINDINGS: Thoracic aortic atherosclerotic vascular disease. Pulmonary arteries are normal. Cardiomegaly. Coronary artery disease. Prior CABG.  No significant mediastinal adenopathy. Sliding hiatal hernia with dilated esophagus. Esophagus  contains fluid. Gastroesophageal reflux could present in this fashion. To evaluate for distal obstructing lesion brain swallow should be considered.  Large airways are patent. Right lower lobe infiltrate consistent with pneumonia.  Multiple hepatic lucencies are noted consistent with simple cysts. Diffuse degenerative changes thoracic spine.  No acute bony abnormality identified. Sclerotic density noted left upper posterior rib, unchanged.  Review of the MIP images confirms the above findings.  IMPRESSION: 1. Right lower lobe infiltrate consistent with pneumonia. 2. Sliding hiatal hernia with dilated thoracic esophagus. Thoracic esophagus contains fluid. Gastroesophageal reflux can present in this fashion. To evaluate for obstructing lesion, barium swallow should be considered. 3. Prior CABG.  Cardiomegaly.  No evidence of pulmonary embolus.   Electronically Signed   By: Marcello Moores  Register   On: 12/09/2013 13:25   Dg Swallowing Func-speech Pathology  01/02/2014   Orbie Pyo [place], CCC-SLP     01/02/2014  3:12 PM Objective Swallowing Evaluation: Modified Barium Swallowing Study   Patient Details  Name: Yannis Broce. MRN: [id number] Date of Birth: 1930-05-16  Today's Date: 01/02/2014 Time: 1340-1400 SLP Time Calculation (min): 20 min  Past Medical History:  Past Medical History  Diagnosis Date  . CORONARY HEART DISEASE     a. s/p CABG;  b.  cath 06/27/10: S-Dx occluded, S-PDA 80-90% (tx  with PCI); S-OM ok, L-LAD ok;  EF 65-70%  c.  s/p Promus DES to  S-PDA 06/2010;   d. Myoview 8/12: low risk  . FIBRILLATION, ATRIAL     post op; ?documented during hosp. 06/2010  . SLEEP APNEA 09/2001    NPSG AHI 22/HR  . ALLERGIC RHINITIS   . ASTHMA   . Depression   . GERD  with HH, hx esophageal stricture  . DYSLIPIDEMIA   . HYPERTENSION     Echo 3/12: EF 55-60%; mod LVH; mild AS/AI; LAE; PASP 38; mild  pulmo HTN  . Personal history of alcoholism   . Diverticulosis   . Benign liver cyst   . Skin cancer     L forearm  . Cataract      surgery to both eyes  . Esophageal stricture   . Hiatal hernia   . Pneumonia 04/2013 hosp  . Shortness of breath    Past Surgical History:  Past Surgical History  Procedure Laterality Date  . Coronary artery bypass graft  02/2007  . Hernia repair  12/03/07  . Tonsillectomy    . Coronary angioplasty with stent placement  07/2010  . Cataract extraction, bilateral  2007  . Hernia repair  unsure ?60's  . Cholecystectomy  02/21/2011    Procedure: LAPAROSCOPIC CHOLECYSTECTOMY WITH INTRAOPERATIVE  CHOLANGIOGRAM;  Surgeon: Pedro Earls, MD;  Location: [facility];   Service: General;  Laterality: N/A;   HPI:  80 y.o. with PMH:  CAD, A-fob, asthma, GERD, esophageal  stricture, hiatal hernia, pna admitted with right upper quadrant  pain, cough, fever.  Recent admission 12/09/13 with pna.  Chest x  ray showed right sided PNA- persistant, mild LE.  MD questions  aspiraiotn pna.  CT showed RRL infiltrate consistent with pna,  sliding hernia with dilated thoracic esophagus.  Bedside swallow  06/2013 resulted in functional swallow with regular diet/thin  liquids recommeded.     Assessment / Plan / Recommendation Clinical Impression  Dysphagia Diagnosis: Mild pharyngeal phase dysphagia Clinical impression: Pt. exhibited a mild motor based pharyngeal  dysphagia due to decreased laryngeal elevation and laryngeal  closure resulting in min-mild silent penetration with thin  barium.  A chin tuck posture decreased penetration to flash (in  and out of laryngeal vestibule).  Recurrent pna's may be due to  combination of pharyngeal as well as his diagnosed small hiatal  hernia and dilated thoracic esophagus diagnosed during chest CT  12/09/13.  SLP recommends continuing a regular diet and thin  liquids with chin tuck (liquids only) and esophageal precautions  with ST follow up for reiteration of strategies.       Treatment Recommendation  Therapy as outlined in treatment plan below    Diet Recommendation Regular;Thin liquid   Liquid  Administration via: Cup;No straw Medication Administration: Whole meds with puree Supervision: Patient able to self feed Compensations: Slow rate;Small sips/bites Postural Changes and/or Swallow Maneuvers: Chin tuck;Seated  upright 90 degrees;Upright 30-60 min after meal    Other  Recommendations Oral Care Recommendations: Oral care BID   Follow Up Recommendations  None    Frequency and Duration min 1 x/week  2 weeks   Pertinent Vitals/Pain No pain           Reason for Referral Objectively evaluate swallowing function   Oral Phase Oral Preparation/Oral Phase Oral Phase: Impaired Oral - Nectar Oral - Nectar Teaspoon: Lingual/palatal residue Oral - Nectar Cup: Not tested   Pharyngeal Phase Pharyngeal Phase Pharyngeal Phase: Impaired Pharyngeal - Nectar Pharyngeal - Nectar Teaspoon: Within functional limits Pharyngeal - Thin Pharyngeal - Thin Teaspoon: Within functional limits Pharyngeal - Thin Cup: Reduced laryngeal elevation;Reduced  airway/laryngeal closure;Penetration/Aspiration during swallow Penetration/Aspiration details (thin cup): Material enters  airway, remains ABOVE vocal cords and not ejected out Pharyngeal - Solids Pharyngeal - Regular: Within functional limits  Cervical Esophageal Phase    GO    Cervical  Esophageal Phase Cervical Esophageal Phase: Darryll Capers         Houston Siren 01/02/2014, 3:11 PM  Orbie Pyo Colvin Caroli.Ed CCC-SLP Pager [phone]    Physical Exam: General: Pleasant, well developed , white male in no acute distress Head: Normocephalic and atraumatic Eyes:  sclerae anicteric, conjunctiva pink  Ears: Normal auditory acuity Lungs: Clear throughout to auscultation Heart: Regular rate and rhythm Abdomen: Soft, non distended, non-tender. No masses, no hepatomegaly. Normal bowel sounds Musculoskeletal: Symmetrical with no gross deformities  Extremities: No edema  Neurological: Alert oriented x 4, grossly nonfocal Psychological:  Alert and cooperative. Normal mood and  affect  Assessment and Recommendations:  #52. 80 year old male with multiple, significant medical problems recently hospitalized with recurrent pneumonia, possibly aspiration. Patient had mild pharyngeal dysphagia on modified barium swallow study. He is doing chin tucks with thin fluids. Though an esophagram was not done,  a chest CTA earlier this month showed a fluid-filled, dilated esophagus. I had a long discussion with patient and his wife regarding the situation. Patient understands that our role is to exclude an esophageal problem and and that he should continue the swallowing precautions recommended by speech therapy. For further evaluation of the esophageal findings on CT scan patient will be scheduled for a barium swallow. It is reassuring that patient has no dysphagia nor any involuntary weight loss  #2. History of iron deficiency anemia. This was addressed at patient's last visit here January 2015. Patient felt to be too high risk for repeat endoscopic procedures. Oral iron and hemoglobin monitoring were recommended. Recent hemoglobin 13.3 on daily iron

## 2014-01-07 NOTE — Telephone Encounter (Signed)
Pt blood pressure is still up.it is 219/104 pulse rate 86.Pt called about this twice yestersay,J C was working with them on this problem.

## 2014-01-07 NOTE — Progress Notes (Signed)
Pre visit review using our clinic review tool, if applicable. No additional management support is needed unless otherwise documented below in the visit note. 

## 2014-01-07 NOTE — Patient Instructions (Addendum)
Triggers for reflux  include stress; the "aspirin family" ; alcohol; peppermint; and caffeine (coffee, tea, cola, and chocolate). The aspirin family would include aspirin and the nonsteroidal agents such as ibuprofen &  Naproxen. Tylenol would not cause reflux. If having symptoms ; food & drink should be avoided for @ least 2 hours before going to bed.   Minimal Blood Pressure Goal= AVERAGE < 150/90;  Ideal is an AVERAGE < 135/85. This AVERAGE should be calculated from @ least 5-7 BP readings taken @ different times of day on different days of week. You should not respond to isolated BP readings , but rather the AVERAGE for that week .Please bring your  blood pressure cuff to office visits to verify that it is reliable.It  can also be checked against the blood pressure device at the pharmacy. Finger or wrist cuffs are not dependable; an arm cuff is.

## 2014-01-08 ENCOUNTER — Other Ambulatory Visit: Payer: Self-pay | Admitting: *Deleted

## 2014-01-08 ENCOUNTER — Ambulatory Visit (HOSPITAL_COMMUNITY)
Admission: RE | Admit: 2014-01-08 | Discharge: 2014-01-08 | Disposition: A | Discharge status: Home / Self Care | Payer: Medicare Other | Source: Ambulatory Visit | Attending: Nurse Practitioner | Admitting: Nurse Practitioner

## 2014-01-08 ENCOUNTER — Encounter: Payer: Self-pay | Admitting: Internal Medicine

## 2014-01-08 ENCOUNTER — Ambulatory Visit (INDEPENDENT_AMBULATORY_CARE_PROVIDER_SITE_OTHER): Payer: Medicare Other | Admitting: Internal Medicine

## 2014-01-08 VITALS — BP 122/70 | HR 65 | Ht 62.0 in

## 2014-01-08 DIAGNOSIS — J189 Pneumonia, unspecified organism: Secondary | ICD-10-CM

## 2014-01-08 DIAGNOSIS — Z8701 Personal history of pneumonia (recurrent): Secondary | ICD-10-CM | POA: Diagnosis not present

## 2014-01-08 DIAGNOSIS — J302 Other seasonal allergic rhinitis: Secondary | ICD-10-CM

## 2014-01-08 DIAGNOSIS — K210 Gastro-esophageal reflux disease with esophagitis: Secondary | ICD-10-CM | POA: Diagnosis not present

## 2014-01-08 DIAGNOSIS — R933 Abnormal findings on diagnostic imaging of other parts of digestive tract: Secondary | ICD-10-CM

## 2014-01-08 DIAGNOSIS — J3089 Other allergic rhinitis: Secondary | ICD-10-CM

## 2014-01-08 DIAGNOSIS — J181 Lobar pneumonia, unspecified organism: Principal | ICD-10-CM

## 2014-01-08 DIAGNOSIS — J449 Chronic obstructive pulmonary disease, unspecified: Secondary | ICD-10-CM

## 2014-01-08 DIAGNOSIS — K219 Gastro-esophageal reflux disease without esophagitis: Secondary | ICD-10-CM

## 2014-01-08 DIAGNOSIS — J309 Allergic rhinitis, unspecified: Secondary | ICD-10-CM

## 2014-01-08 DIAGNOSIS — G4733 Obstructive sleep apnea (adult) (pediatric): Secondary | ICD-10-CM

## 2014-01-08 MED ORDER — DILTIAZEM HCL ER COATED BEADS 240 MG PO CP24: 240.0000 mg | ORAL_CAPSULE | Freq: Two times a day (BID) | ORAL | Status: DC

## 2014-01-08 NOTE — Patient Instructions (Signed)
You have been having repeated aspiration pneumonia from stomach juice back flowing up from your stomach into your lungs.  Suggest you elevate the head of your bed frame with a brick under each head leg  Don't eat for at least an hour before you lie down

## 2014-01-08 NOTE — Progress Notes (Signed)
Patient ID: [name], male    DOB: 1930-07-08, 80 y.o.   MRN: [id number]  HPI 11/16/10- 47 year old man followed for severe chronic asthma, sleep apnea, allergic rhinitis, complicated by HBP CAD history of atrial fibrillation, GERD Last here May 20, 2010 CPAP 10 all night every night- helps sleep. Asks refill Provigil to help with long drives.  Allergy vaccine GO definitely helps- he is convinced.  He notes no asthma and no prednisone since last November- credits frequent handwashing, avoiding people with colds and the allergy vaccine. No new concerns. Continues cardiac rehab.   03/03/11-  37 year old man followed for severe chronic asthma, sleep apnea, allergic rhinitis, complicated by HBP CAD history of atrial fibrillation, GERD. Wife here. He recently had his fourth hospital stay since March, for gallbladder pain, cholecystectomy, cardiac stent. Cholecystectomy was 2 weeks ago. Through these he had shortness of breath and cough which has persisted and keep him awake. His nose runs.  07/03/11- 33 year old man followed for severe chronic asthma, sleep apnea, allergic rhinitis, complicated by HBP, CAD, history of atrial fibrillation, GERD. Wife here. Since last here has been to urgent care in emergency room 4 times for exacerbations of asthma. On prednisone 10 mg/day since February from Dr [name]. Definitely having reflux events and we discussed how that may be the significant issue. Denies sinus infection. Had chest x-rays in January and February which she was told were negative. He asks to try increasing his CPAP pressure because he doesn't feel he is sleeping quite well enough. We discussed this.  07/18/11- 11 year old man followed for severe chronic asthma, sleep apnea, allergic rhinitis, complicated by HBP, CAD, history of atrial fibrillation, GERD. Wife here. Seen by Dr [name]-Increased SOB and wheezing; was put on prednisone to help keep patient out of hospital; still not feeling  much better. Has been on prednisone 60 mg daily for past 4 days but still significant shortness of breath with exertion and wheeze. No excess 2 chest x-rays have been clear and EKG "knot heart". Using home nebulizer with DuoNeb 4 times daily plus his rescue inhaler at least twice per day. Nothing seems infected. He has not recognized heartburn although that has been an issue in the past. Denies chest pain, fever, ankle edema or palpitation. Some nasal congestion without much sneezing. Has been indoors without much exposure to pollen.  08/08/11- 44 year old man followed for severe chronic asthma, sleep apnea, allergic rhinitis, complicated by HBP, CAD, history of atrial fibrillation, GERD. Wife here. Hospitalized April 11-17 with acute exacerbation of his chronic fixed asthma. Anxiety and reflux are thought to be important. He says he is "doing great" now. He feels an anxiety medication would help and we discussed this. Currently on prednisone 20 mg daily.  10/04/11-  22 year old man followed for severe chronic asthma, sleep apnea, allergic rhinitis, complicated by HBP, CAD, history of atrial fibrillation, GERD. Wife here  Pt states increase of sob,wheezing,fatigue since getting out of hospital . No hospitalizations since last here. He stays short of breath especially with exertional dyspnea but is comfortable sitting and when lying in bed. His wife points out he is able to do yard work using a [employer]. We reviewed Dr.Hochrein's cardiology note. Question, , each of his dyspnea is pulmonary versus cardiac. We were asked to draw labs at this visit. He was criticized for using his nebulizer machine too frequently before his hospitalization but has only used it a total of 7 times since then. He had remained on maintenance prednisone  since January. His wife gradually tapered it off so he has had none in the last 3 weeks. He continues, and wants to continue, allergy vaccine at 1:10, well tolerated. CXR 07/22/11-    reviewed with them: Findings:  Grossly unchanged enlarged cardiac silhouette and mediastinal  contours post median sternotomy and CABG. Atherosclerotic  calcification within the thoracic aorta. There is persistent mild  elevation of the right hemidiaphragm. Grossly unchanged bibasilar  heterogeneous opacities favored to represent atelectasis. Grossly  unchanged bones including mild compression deformity of a lower  thoracic vertebral body.  IMPRESSION:  No acute cardiopulmonary disease.  Original Report Authenticated By: [name], M.D.  Office Spirometry: FEV1 1.40/58%, FVC 2.44/78%, FEV1/FVC 0.57/73%, FEF 25-75% 0.56/23%.  01/08/12- 89 year old man followed for severe chronic asthma, sleep apnea, allergic rhinitis, complicated by HBP, CAD, history of atrial fibrillation, GERD. Wife here Needs refill for rescue inhaler as he is leaving for beach in am; breathing is doing pretty well; only one treatment since seeing [place] last. . Asks for an antibiotic to carry.  05/10/2012 Acute OV Complains of increased dyspnea , coughing and wheezing. This woke him up this morning and used his neb without relief then used his inhaler without any relief.  Dry cough and wheezing started last pm.  Patient denies any hemoptysis, orthopnea, PND, leg swelling, or calf pain. Patient reports that wheezes. Started 2-3 days ago and worsened. Last night. Prior to going to bed. He had started on prednisone earlier in the week. at 10 mg and is currently on 5 mg daily .  He had his rescue inhaler this am with some relief.  05/30/12- 36 year old man followed for severe chronic asthma, sleep apnea, allergic rhinitis, complicated by HBP, CAD, history of atrial fibrillation, GERD. Wife here He says he is a well now, off prednisone x2 weeks. Likes Mucinex DM. We discussed steroidsand I suggested bone density assessment. We discussed steroid sparing availability of Daliresp trial is frequency of bronchitic exacerbations.   He continues using CPAP 12/ [facility] with no concerns.  09/05/12- 5 year old man followed for severe chronic asthma, sleep apnea, allergic rhinitis, complicated by HBP, CAD, history of atrial fibrillation, GERD.     Wife here FOLLOWS FOR: had to go to UC on 08-26-12 due to breathing issues. Was given shot, neb tx, and pred taper. Had caught a cold while fishing, leading to exacerbation. Finish prednisone taper 2 days ago. Feels "great" now. Allergy vaccine 1:10 GO CPAP 12/[facility] with good compliance and control CXR 08/26/12 IMPRESSION:  No active disease.  Original Report Authenticated By: Vallery Ridge, M.D.  11/04/16 -20 year old man followed for severe chronic asthma, sleep apnea, allergic rhinitis, complicated by HBP, CAD, history of atrial fibrillation/ pacemaker , GERD.     Wife here( She has mitochondrial myopathy) FOLOWS FOR: reports has been having diff w wheezing denies any chest tightness-- states he has been  seen in [place] clinic every month for breathing and per that MD patient may need some medication readjustment   wheeze and shortness of breath, sometimes green mucus. Gets better with neb been steroid but then relapses each time. Uses home nebulizer 3 times daily but doesn't last. Blamed Advair for 2 episodes of pneumonia-discussed. They feel he is doing well with allergy vaccine 1: 10 GO. OSA- good compliance and control with CPAP 12/ [facility]..  03/12/13- 76 year old man followed for severe chronic asthma, sleep apnea, allergic rhinitis, complicated by HBP, CAD, history of atrial fibrillation/ pacemaker , GERD.     (Wife  has mitochondrial myopathy) FOLLOWS FOR: since being on Dulera 100 has not had any flare ups or had to use nebulizer machine. Recent hospital stay at [facility] for heart issues- AFib/ tachycardia.  Feels "nervous, chilly" but not sick. Little cough or wheeze. Has been able to cut way down on nebulizer and w/o need recent prednisone. Ankles swell. Note med list shows  both diltiazem and Norvasc.  07/29/13- 43 year old M former smoker(120 pk yr) followed for severe chronic asthma/ COPD, OSA/CPAP, allergic rhinitis, complicated by HBP, CAD, history of AFib/ pacemaker , GERD.     (Wife  has mitochondrial myopathy) Allergy vaccine 1:10 GO     CPAP 12/ [facility]    FOLLOWS FOR: has been in hospital 3 times since Dec 2014. Pt has had flare ups with COPD. Breathing now is at baseline. Going to gym for exercise twice weekly. He likes [employer] Would still like to keep a few Nuvigil tablets around for long drives- discussed. He asks about updating pneumonia vaccine. He is at very high risk for pneumonia and complications. We compared Pneumovax-23 with Prevnar 13 and guidelines. CXR 06/14/13 IMPRESSION:  Findings concerning for right perihilar and infrahilar pneumonia.  Resolution of the previously seen left pneumonia.  Electronically Signed  By: Rolm Baptise M.D.  On: 06/14/2013 13:51 Office spirometry 07/29/2013- moderate obstructive airways disease-FVC 2.50/81%, FEV1 1.62/68%, FEV1/FVC 0.65, FEF 25-75% 0.90/39%.  11/25/13- 24 year old M former smoker(120 pk yr) followed for severe chronic asthma/ COPD, OSA/CPAP, allergic rhinitis, complicated by HBP, CAD, history of AFib/ pacemaker , GERD.     (Wife  has mitochondrial myopathy) FOLLOWS JME:[id number] good since on Dulera (in [place]),no cough,no wheezing,sleeps well,CPAP works well Allergy vaccine 1:10 GO     CPAP 12/ [facility]    01/08/14-  80 year old M former smoker(120 pk yr) followed for severe chronic asthma/ COPD, OSA/CPAP, allergic rhinitis, complicated by HBP, CAD/ CABG, history of AFib/ pacemaker , GERD, recurrent aspiration pneumonia.      (Wife here-  has mitochondrial myopathy) Post Hospital-01/01/14 through 01/04/14 for [facility] PNA c/w aspiration Swallowing eval showed mild dysphagia- need chin tuck Ba swallow 01/08/14- confirmed reflux into lower 1/3 esophagus, esophageal stricture  Allergy vaccine 1:10 GO     CPAP  12/ [facility] CXR 01/01/14 IMPRESSION:  1. Persistent right lower lobe infiltrate.  2. Prior CABG. Mild cardiomegaly, no pulmonary venous congestion.  Electronically Signed  By: Marcello Moores Register  On: 01/01/2014 10:25    ROS-see HPI Constitutional:   No-   weight loss, night sweats, fevers, chills, fatigue, lassitude. HEENT:   No-  headaches, difficulty swallowing, tooth/dental problems, sore throat,       No-  sneezing, itching, ear ache, nasal congestion, post nasal drip,  CV:  No-   chest pain, orthopnea, PND, swelling in lower extremities, anasarca, dizziness, palpitations Resp: +shortness of breath with exertion or at rest.              No-   productive cough,  + non-productive cough,  No- coughing up of blood.              No-change in color of mucus.  No- wheezing.   Skin: No-   rash or lesions. GI:  No-   heartburn, indigestion, abdominal pain, nausea, vomiting,  GU:  MS:  No-   joint pain or swelling.   Neuro-     nothing unusual Psych:  No- change in mood or affect. No depression or anxiety.  No memory loss.  Objective:  OBJ- Physical Exam  General- Alert, Oriented, Affect-appropriate, Distress- none acute Skin- rash-none, lesions- none, excoriation- none Lymphadenopathy- none Head- atraumatic            Eyes- Gross vision intact, PERRLA, conjunctivae and secretions clear            Ears- Hearing aid            Nose- Clear, no-Septal dev, mucus, polyps, erosion, perforation             Throat- Mallampati III , mucosa clear , drainage- none, tonsils- atrophic Neck- flexible , trachea midline, no stridor , thyroid nl, carotid no bruit Chest - symmetrical excursion , unlabored           Heart/CV- RRR , no murmur , no gallop  , no rub, nl s1 s2                           - JVD- none , edema-none, stasis changes- none, varices- none           Lung- +Clear, wheeze- none, unlabored, cough- none , dullness-none, rub- none           Chest wall- Hyperinflated barrel chest Abd-  Br/  Gen/ Rectal- Not done, not indicated Extrem- cyanosis- none, clubbing, none, atrophy- none, strength- nl Neuro- grossly intact to observation. No tremor

## 2014-01-08 NOTE — Progress Notes (Signed)
Agree with initial assessment and plans as outlined 

## 2014-01-09 NOTE — Assessment & Plan Note (Signed)
Clinically he has fully resolved pneumonias from summer, 2015. Educated pneumonia vaccines and reflux precautions

## 2014-01-09 NOTE — Assessment & Plan Note (Signed)
He and wife have chosen to continue his vaccine as helpful

## 2014-01-09 NOTE — Assessment & Plan Note (Signed)
Doesn't like to sleep w/o CPAP, fully compliant

## 2014-01-09 NOTE — Assessment & Plan Note (Signed)
Acute episodes associated with viral infections and with reflux events. Educated.

## 2014-01-20 NOTE — Telephone Encounter (Signed)
error 

## 2014-01-23 ENCOUNTER — Other Ambulatory Visit: Payer: Self-pay | Admitting: Internal Medicine

## 2014-02-02 DIAGNOSIS — I10 Essential (primary) hypertension: Secondary | ICD-10-CM

## 2014-02-02 DIAGNOSIS — E785 Hyperlipidemia, unspecified: Secondary | ICD-10-CM

## 2014-02-02 DIAGNOSIS — J690 Pneumonitis due to inhalation of food and vomit: Secondary | ICD-10-CM

## 2014-02-05 ENCOUNTER — Other Ambulatory Visit: Payer: Self-pay | Admitting: Cardiology

## 2014-02-15 ENCOUNTER — Other Ambulatory Visit: Payer: Self-pay | Admitting: Internal Medicine

## 2014-02-16 ENCOUNTER — Encounter: Payer: Self-pay | Admitting: Internal Medicine

## 2014-02-21 ENCOUNTER — Other Ambulatory Visit: Payer: Self-pay | Admitting: Cardiology

## 2014-03-02 ENCOUNTER — Other Ambulatory Visit: Payer: Self-pay | Admitting: Internal Medicine

## 2014-03-03 NOTE — Telephone Encounter (Signed)
Faxed script back to [facility]...lmb 

## 2014-03-13 ENCOUNTER — Ambulatory Visit (INDEPENDENT_AMBULATORY_CARE_PROVIDER_SITE_OTHER): Payer: Medicare Other

## 2014-03-13 DIAGNOSIS — J309 Allergic rhinitis, unspecified: Secondary | ICD-10-CM

## 2014-03-15 ENCOUNTER — Other Ambulatory Visit: Payer: Self-pay | Admitting: Internal Medicine

## 2014-03-18 ENCOUNTER — Telehealth: Payer: Self-pay | Admitting: Internal Medicine

## 2014-03-18 NOTE — Telephone Encounter (Signed)
Wife called to inform us that pt would need Prolia shot at his OV Jan 13th and to order it.

## 2014-03-19 ENCOUNTER — Telehealth: Payer: Self-pay | Admitting: Cardiology

## 2014-03-19 NOTE — Telephone Encounter (Signed)
Advocate from [facility] program calling for summary of last OV and last procedure. Read back results available to pt advocate. She did state that pt's wife expressed concern about the pt's medication compliance being an issue, especially diuretic. No other questions or concerns.

## 2014-03-21 ENCOUNTER — Encounter (HOSPITAL_COMMUNITY): Payer: Self-pay | Admitting: Emergency Medicine

## 2014-03-21 ENCOUNTER — Emergency Department (HOSPITAL_COMMUNITY)
Admission: EM | Admit: 2014-03-21 | Discharge: 2014-03-21 | Disposition: A | Discharge status: Home / Self Care | Payer: Medicare Other | Attending: Emergency Medicine | Admitting: Emergency Medicine

## 2014-03-21 DIAGNOSIS — E785 Hyperlipidemia, unspecified: Secondary | ICD-10-CM | POA: Insufficient documentation

## 2014-03-21 DIAGNOSIS — R0789 Other chest pain: Secondary | ICD-10-CM | POA: Diagnosis not present

## 2014-03-21 DIAGNOSIS — Z85828 Personal history of other malignant neoplasm of skin: Secondary | ICD-10-CM | POA: Insufficient documentation

## 2014-03-21 DIAGNOSIS — J45909 Unspecified asthma, uncomplicated: Secondary | ICD-10-CM | POA: Insufficient documentation

## 2014-03-21 DIAGNOSIS — Z8701 Personal history of pneumonia (recurrent): Secondary | ICD-10-CM | POA: Diagnosis not present

## 2014-03-21 DIAGNOSIS — Z8669 Personal history of other diseases of the nervous system and sense organs: Secondary | ICD-10-CM | POA: Diagnosis not present

## 2014-03-21 DIAGNOSIS — R030 Elevated blood-pressure reading, without diagnosis of hypertension: Secondary | ICD-10-CM

## 2014-03-21 DIAGNOSIS — I4891 Unspecified atrial fibrillation: Secondary | ICD-10-CM | POA: Insufficient documentation

## 2014-03-21 DIAGNOSIS — K219 Gastro-esophageal reflux disease without esophagitis: Secondary | ICD-10-CM | POA: Insufficient documentation

## 2014-03-21 DIAGNOSIS — I10 Essential (primary) hypertension: Secondary | ICD-10-CM | POA: Insufficient documentation

## 2014-03-21 DIAGNOSIS — Z955 Presence of coronary angioplasty implant and graft: Secondary | ICD-10-CM | POA: Diagnosis not present

## 2014-03-21 DIAGNOSIS — Z951 Presence of aortocoronary bypass graft: Secondary | ICD-10-CM | POA: Insufficient documentation

## 2014-03-21 DIAGNOSIS — I2510 Atherosclerotic heart disease of native coronary artery without angina pectoris: Secondary | ICD-10-CM | POA: Insufficient documentation

## 2014-03-21 DIAGNOSIS — Z79899 Other long term (current) drug therapy: Secondary | ICD-10-CM | POA: Diagnosis not present

## 2014-03-21 DIAGNOSIS — Z87891 Personal history of nicotine dependence: Secondary | ICD-10-CM | POA: Diagnosis not present

## 2014-03-21 DIAGNOSIS — IMO0001 Reserved for inherently not codable concepts without codable children: Secondary | ICD-10-CM

## 2014-03-21 DIAGNOSIS — Z8659 Personal history of other mental and behavioral disorders: Secondary | ICD-10-CM | POA: Diagnosis not present

## 2014-03-21 LAB — COMPREHENSIVE METABOLIC PANEL
ALK PHOS: 83 U/L (ref 39–117)
ALT: 25 U/L (ref 0–53)
ANION GAP: 15 (ref 5–15)
AST: 21 U/L (ref 0–37)
Albumin: 4 g/dL (ref 3.5–5.2)
BILIRUBIN TOTAL: 0.3 mg/dL (ref 0.3–1.2)
BUN: 16 mg/dL (ref 6–23)
CHLORIDE: 105 meq/L (ref 96–112)
CO2: 23 mEq/L (ref 19–32)
Calcium: 9.3 mg/dL (ref 8.4–10.5)
Creatinine, Ser: 0.89 mg/dL (ref 0.50–1.35)
GFR calc non Af Amer: 78 mL/min — ABNORMAL LOW (ref >90)
GLUCOSE: 109 mg/dL — AB (ref 70–99)
POTASSIUM: 4.1 meq/L (ref 3.7–5.3)
Sodium: 143 mEq/L (ref 137–147)
Total Protein: 6.9 g/dL (ref 6.0–8.3)

## 2014-03-21 LAB — CBC WITH DIFFERENTIAL/PLATELET
Basophils Absolute: 0 10*3/uL (ref 0.0–0.1)
Basophils Relative: 0 % (ref 0–1)
Eosinophils Absolute: 0.1 10*3/uL (ref 0.0–0.7)
Eosinophils Relative: 2 % (ref 0–5)
HCT: 39 % (ref 39.0–52.0)
Hemoglobin: 13 g/dL (ref 13.0–17.0)
LYMPHS ABS: 1.9 10*3/uL (ref 0.7–4.0)
Lymphocytes Relative: 28 % (ref 12–46)
MCH: 30.1 pg (ref 26.0–34.0)
MCHC: 33.3 g/dL (ref 30.0–36.0)
MCV: 90.3 fL (ref 78.0–100.0)
Monocytes Absolute: 0.6 10*3/uL (ref 0.1–1.0)
Monocytes Relative: 9 % (ref 3–12)
NEUTROS ABS: 4.3 10*3/uL (ref 1.7–7.7)
NEUTROS PCT: 61 % (ref 43–77)
Platelets: 160 10*3/uL (ref 150–400)
RBC: 4.32 MIL/uL (ref 4.22–5.81)
RDW: 14.8 % (ref 11.5–15.5)
WBC: 6.9 10*3/uL (ref 4.0–10.5)

## 2014-03-21 LAB — I-STAT TROPONIN, ED: Troponin i, poc: 0 ng/mL (ref 0.00–0.08)

## 2014-03-21 LAB — TROPONIN I: Troponin I: <0.3 ng/mL (ref <0.30)

## 2014-03-21 MED ORDER — LABETALOL HCL 5 MG/ML IV SOLN
10.0000 mg | Freq: Once | INTRAVENOUS | Status: AC
  Administered 2014-03-21: 10 mg via INTRAVENOUS
  Filled 2014-03-21: qty 4

## 2014-03-21 NOTE — Discharge Instructions (Signed)
Chest Pain (Nonspecific) °It is often hard to give a specific diagnosis for the cause of chest pain. There is always a chance that your pain could be related to something serious, such as a heart attack or a blood clot in the lungs. You need to follow up with your health care provider for further evaluation. °CAUSES  °· Heartburn. °· Pneumonia or bronchitis. °· Anxiety or stress. °· Inflammation around your heart (pericarditis) or lung (pleuritis or pleurisy). °· A blood clot in the lung. °· A collapsed lung (pneumothorax). It can develop suddenly on its own (spontaneous pneumothorax) or from trauma to the chest. °· Shingles infection (herpes zoster virus). °The chest wall is composed of bones, muscles, and cartilage. Any of these can be the source of the pain. °· The bones can be bruised by injury. °· The muscles or cartilage can be strained by coughing or overwork. °· The cartilage can be affected by inflammation and become sore (costochondritis). °DIAGNOSIS  °Lab tests or other studies may be needed to find the cause of your pain. Your health care provider may have you take a test called an ambulatory electrocardiogram (ECG). An ECG records your heartbeat patterns over a 24-hour period. You may also have other tests, such as: °· Transthoracic echocardiogram (TTE). During echocardiography, sound waves are used to evaluate how blood flows through your heart. °· Transesophageal echocardiogram (TEE). °· Cardiac monitoring. This allows your health care provider to monitor your heart rate and rhythm in real time. °· Holter monitor. This is a portable device that records your heartbeat and can help diagnose heart arrhythmias. It allows your health care provider to track your heart activity for several days, if needed. °· Stress tests by exercise or by giving medicine that makes the heart beat faster. °TREATMENT  °· Treatment depends on what may be causing your chest pain. Treatment may include: °· Acid blockers for  heartburn. °· Anti-inflammatory medicine. °· Pain medicine for inflammatory conditions. °· Antibiotics if an infection is present. °· You may be advised to change lifestyle habits. This includes stopping smoking and avoiding alcohol, caffeine, and chocolate. °· You may be advised to keep your head raised (elevated) when sleeping. This reduces the chance of acid going backward from your stomach into your esophagus. °Most of the time, nonspecific chest pain will improve within 2-3 days with rest and mild pain medicine.  °HOME CARE INSTRUCTIONS  °· If antibiotics were prescribed, take them as directed. Finish them even if you start to feel better. °· For the next few days, avoid physical activities that bring on chest pain. Continue physical activities as directed. °· Do not use any tobacco products, including cigarettes, chewing tobacco, or electronic cigarettes. °· Avoid drinking alcohol. °· Only take medicine as directed by your health care provider. °· Follow your health care provider's suggestions for further testing if your chest pain does not go away. °· Keep any follow-up appointments you made. If you do not go to an appointment, you could develop lasting (chronic) problems with pain. If there is any problem keeping an appointment, call to reschedule. °SEEK MEDICAL CARE IF:  °· Your chest pain does not go away, even after treatment. °· You have a rash with blisters on your chest. °· You have a fever. °SEEK IMMEDIATE MEDICAL CARE IF:  °· You have increased chest pain or pain that spreads to your arm, neck, jaw, back, or abdomen. °· You have shortness of breath. °· You have an increasing cough, or you cough   up blood.  You have severe back or abdominal pain.  You feel nauseous or vomit.  You have severe weakness.  You faint.  You have chills. This is an emergency. Do not wait to see if the pain will go away. Get medical help at once. Call your local emergency services (911 in [place] Do not drive  yourself to the hospital. MAKE SURE YOU:   Understand these instructions.  Will watch your condition.  Will get help right away if you are not doing well or get worse. Document Released: 01/04/2005 Document Revised: 04/01/2013 Document Reviewed: 10/31/2007 [employer] Patient Information 2015 [place], [place]. This information is not intended to replace advice given to you by your health care provider. Make sure you discuss any questions you have with your health care provider.  Hypertension Hypertension, commonly called high blood pressure, is when the force of blood pumping through your arteries is too strong. Your arteries are the blood vessels that carry blood from your heart throughout your body. A blood pressure reading consists of a higher number over a lower number, such as 110/72. The higher number (systolic) is the pressure inside your arteries when your heart pumps. The lower number (diastolic) is the pressure inside your arteries when your heart relaxes. Ideally you want your blood pressure below 120/80. Hypertension forces your heart to work harder to pump blood. Your arteries may become narrow or stiff. Having hypertension puts you at risk for heart disease, stroke, and other problems.  RISK FACTORS Some risk factors for high blood pressure are controllable. Others are not.  Risk factors you cannot control include:   Race. You may be at higher risk if you are African American.  Age. Risk increases with age.  Gender. Men are at higher risk than women before age 17 years. After age 24, women are at higher risk than men. Risk factors you can control include:  Not getting enough exercise or physical activity.  Being overweight.  Getting too much fat, sugar, calories, or salt in your diet.  Drinking too much alcohol. SIGNS AND SYMPTOMS Hypertension does not usually cause signs or symptoms. Extremely high blood pressure (hypertensive crisis) may cause headache, anxiety, shortness  of breath, and nosebleed. DIAGNOSIS  To check if you have hypertension, your health care provider will measure your blood pressure while you are seated, with your arm held at the level of your heart. It should be measured at least twice using the same arm. Certain conditions can cause a difference in blood pressure between your right and left arms. A blood pressure reading that is higher than normal on one occasion does not mean that you need treatment. If one blood pressure reading is high, ask your health care provider about having it checked again. TREATMENT  Treating high blood pressure includes making lifestyle changes and possibly taking medicine. Living a healthy lifestyle can help lower high blood pressure. You may need to change some of your habits. Lifestyle changes may include:  Following the DASH diet. This diet is high in fruits, vegetables, and whole grains. It is low in salt, red meat, and added sugars.  Getting at least 2 hours of brisk physical activity every week.  Losing weight if necessary.  Not smoking.  Limiting alcoholic beverages.  Learning ways to reduce stress. If lifestyle changes are not enough to get your blood pressure under control, your health care provider may prescribe medicine. You may need to take more than one. Work closely with your health care provider  to understand the risks and benefits. °HOME CARE INSTRUCTIONS °· Have your blood pressure rechecked as directed by your health care provider.   °· Take medicines only as directed by your health care provider. Follow the directions carefully. Blood pressure medicines must be taken as prescribed. The medicine does not work as well when you skip doses. Skipping doses also puts you at risk for problems.   °· Do not smoke.   °· Monitor your blood pressure at home as directed by your health care provider.  °SEEK MEDICAL CARE IF:  °· You think you are having a reaction to medicines taken. °· You have recurrent  headaches or feel dizzy. °· You have swelling in your ankles. °· You have trouble with your vision. °SEEK IMMEDIATE MEDICAL CARE IF: °· You develop a severe headache or confusion. °· You have unusual weakness, numbness, or feel faint. °· You have severe chest or abdominal pain. °· You vomit repeatedly. °· You have trouble breathing. °MAKE SURE YOU:  °· Understand these instructions. °· Will watch your condition. °· Will get help right away if you are not doing well or get worse. °Document Released: 03/27/2005 Document Revised: 08/11/2013 Document Reviewed: 01/17/2013 °[employer]® Patient Information ©2015 [employer]. This information is not intended to replace advice given to you by your health care provider. Make sure you discuss any questions you have with your health care provider. ° °

## 2014-03-21 NOTE — ED Notes (Signed)
Pt reports missing a dose of b/p medications yesterday morning and has since been unable to get his blood pressure under control. Pt denies any cp, vision changes or headache but does state he has some chills. Pt alert and oriented x 4. Dr. Lita Mains at bedside.

## 2014-03-21 NOTE — ED Provider Notes (Signed)
CSN: [id number]     Arrival date & time 03/21/14  0307 History  This chart was scribed for Julianne Rice, MD by Chester Holstein, ED Scribe. This patient was seen in room A05C/A05C and the patient's care was started at 3:36 AM.     Chief Complaint  Patient presents with  . Hypertension    Patient is a 80 y.o. male presenting with hypertension. The history is provided by the patient and the spouse. No language interpreter was used.  Hypertension Pertinent negatives include no abdominal pain, no headaches and no shortness of breath.    HPI Comments: Stephen Deyton. is a 80 y.o. male who presents to the Emergency Department complaining of elevated BP with onset yesterday morning.  Pt notes associated chills and chest tightness. Pt notes he feels well now. Denies any chest tightness currently. Patient forgot his morning dose of blood pressure management medication and didn't take it until 6 PM. His blood pressure has been difficult to control since. He's had systolics greater than 568 throughout the day. He had mild chest tightness which is now resolved. He denies any visual changes, focal weakness or numbness.  Past Medical History  Diagnosis Date  . CORONARY HEART DISEASE     a. s/p CABG;  b.  cath 06/27/10: S-Dx occluded, S-PDA 80-90% (tx with PCI); S-OM ok, L-LAD ok;  EF 65-70%  c.  s/p Promus DES to S-PDA 06/2010;   d. Myoview 8/12: low risk  . FIBRILLATION, ATRIAL     post op; ?documented during hosp. 06/2010  . SLEEP APNEA 09/2001    NPSG AHI 22/HR  . ALLERGIC RHINITIS   . ASTHMA   . Depression   . GERD     with HH, hx esophageal stricture  . DYSLIPIDEMIA   . HYPERTENSION     Echo 3/12: EF 55-60%; mod LVH; mild AS/AI; LAE; PASP 38; mild pulmo HTN  . Personal history of alcoholism   . Diverticulosis   . Benign liver cyst   . Skin cancer     L forearm  . Cataract     surgery to both eyes  . Esophageal stricture   . Hiatal hernia   . Pneumonia 04/2013 hosp  . Shortness of  breath    Past Surgical History  Procedure Laterality Date  . Coronary artery bypass graft  02/2007  . Tonsillectomy    . Coronary angioplasty with stent placement  07/2010  . Cataract extraction, bilateral  2007  . Hernia repair  unsure ?60's  . Cholecystectomy  02/21/2011    Procedure: LAPAROSCOPIC CHOLECYSTECTOMY WITH INTRAOPERATIVE CHOLANGIOGRAM;  Surgeon: Pedro Earls, MD;  Location: [facility];  Service: General;  Laterality: N/A;   Family History  Problem Relation Age of Onset  . COPD Sister   . Asthma Sister   . Emphysema Sister   . Hypertension Sister   . Hypertension Mother   . Colon cancer Neg Hx   . Heart disease Brother   . Heart disease Mother   . Heart disease Father    History  Substance Use Topics  . Smoking status: Former Smoker -- 3.00 packs/day for 40 years    Types: Cigarettes    Quit date: 04/11/1979  . Smokeless tobacco: Never Used  . Alcohol Use: No    Review of Systems  Constitutional: Positive for chills. Negative for fever.  Eyes: Negative for visual disturbance.  Respiratory: Positive for chest tightness. Negative for cough and shortness of breath.   Cardiovascular:  Negative for palpitations and leg swelling.  Gastrointestinal: Negative for nausea, vomiting and abdominal pain.  Musculoskeletal: Negative for back pain, neck pain and neck stiffness.  Skin: Negative for rash and wound.  Neurological: Negative for dizziness, syncope, weakness, light-headedness, numbness and headaches.  All other systems reviewed and are negative.     Allergies  Ace inhibitors; Codeine; and Lasix  Home Medications   Prior to Admission medications   Medication Sig Start Date End Date Taking? Authorizing Provider  albuterol (PROVENTIL HFA;VENTOLIN HFA) 108 (90 BASE) MCG/ACT inhaler Inhale 2 puffs into the lungs every 6 (six) hours as needed for wheezing or shortness of breath.    Yes Historical Provider, MD  apixaban (ELIQUIS) 5 MG TABS tablet Take 1 tablet  (5 mg total) by mouth 2 (two) times daily. 11/11/13  Yes Minus Breeding, MD  bisoprolol (ZEBETA) 10 MG tablet Take 1.5 tablets (15 mg total) by mouth daily. Patient taking differently: Take 5-10 mg by mouth 2 (two) times daily. Take 10mg  in the morning and 5mg  in the evening. 12/31/13  Yes Minus Breeding, MD  budesonide (PULMICORT) 0.25 MG/2ML nebulizer solution Take 0.25 mg by nebulization 2 (two) times daily as needed. For asthma   Yes Historical Provider, MD  calcium-vitamin D (OSCAL WITH D) 500-200 MG-UNIT per tablet Take 1 tablet by mouth 2 (two) times daily.   Yes Historical Provider, MD  cholestyramine Lucrezia Starch) 4 G packet Take 4 g by mouth daily. 10/30/13  Yes Historical Provider, MD  cloNIDine (CATAPRES) 0.3 MG tablet Take 1 tablet (0.3 mg total) by mouth 2 (two) times daily. 07/24/13  Yes Minus Breeding, MD  denosumab (PROLIA) 60 MG/ML SOLN injection Inject 60 mg into the skin every 6 (six) months. Administer in upper arm, thigh, or abdomen 06/12/12  Yes Rowe Clack, MD  diltiazem (CARDIZEM CD) 240 MG 24 hr capsule Take 1 capsule (240 mg total) by mouth 2 (two) times daily. 01/08/14  Yes Mihai Croitoru, MD  ferrous sulfate 325 (65 FE) MG tablet Take 325 mg by mouth 3 (three) times daily.    Yes Historical Provider, MD  furosemide (LASIX) 40 MG tablet Take 40 mg by mouth daily as needed for fluid.   Yes Historical Provider, MD  ipratropium-albuterol (DUONEB) 0.5-2.5 (3) MG/3ML SOLN Take 3 mLs by nebulization every 6 (six) hours as needed (for shortness of breath). 10/08/13  Yes Deneise Lever, MD  isosorbide-hydrALAZINE (BIDIL) 20-37.5 MG per tablet Take 1 tablet by mouth 3 (three) times daily. 08/13/13  Yes Rowe Clack, MD  latanoprost (XALATAN) 0.005 % ophthalmic solution Place 1 drop into both eyes at bedtime.    Yes Historical Provider, MD  LORazepam (ATIVAN) 0.5 MG tablet Take 1 tablet (0.5 mg total) by mouth 3 (three) times daily. 03/03/14  Yes Rowe Clack, MD  losartan  (COZAAR) 100 MG tablet Take 100 mg by mouth daily.   Yes Historical Provider, MD  mometasone-formoterol (DULERA) 100-5 MCG/ACT AERO Inhale 2 puffs into the lungs 2 (two) times daily.   Yes Historical Provider, MD  montelukast (SINGULAIR) 10 MG tablet Take 10 mg by mouth at bedtime.   Yes Historical Provider, MD  nitroGLYCERIN (NITROSTAT) 0.4 MG SL tablet Place 0.4 mg under the tongue every 5 (five) minutes as needed for chest pain.   Yes Historical Provider, MD  pantoprazole (PROTONIX) 40 MG tablet take 1 tablet by mouth twice a day 02/24/14  Yes Minus Breeding, MD  potassium chloride SA (K-DUR,KLOR-CON) 20 MEQ tablet Take  20 mEq by mouth 2 (two) times daily.   Yes Historical Provider, MD  pravastatin (PRAVACHOL) 80 MG tablet Take 80 mg by mouth every evening.   Yes Historical Provider, MD  Probiotic Product ([place]) Take 1 capsule by mouth daily.   Yes Historical Provider, MD  RA VITAMIN B-12 TR 1000 MCG TBCR take 1 tablet by mouth once daily 01/23/14  Yes Rowe Clack, MD  amoxicillin-clavulanate (AUGMENTIN) 875-125 MG per tablet Take 1 tablet by mouth 2 (two) times daily. Patient not taking: Reported on 03/21/2014 01/04/14   Jonetta Osgood, MD  ferrous sulfate 325 (65 FE) MG tablet take 1 tablet by mouth three times a day with food Patient not taking: Reported on 03/21/2014 02/06/14   Minus Breeding, MD  predniSONE (DELTASONE) 10 MG tablet Take 4 tablets (40 mg) daily for 2 days, then, Take 3 tablets (30 mg) daily for 2 days, then, Take 2 tablets (20 mg) daily for 2 days, then, Take 1 tablets (10 mg) daily for 1 days, then stop Patient not taking: Reported on 03/21/2014 01/04/14   Jonetta Osgood, MD  torsemide (DEMADEX) 20 MG tablet Take 20 mg by mouth daily as needed (for fluid).     Historical Provider, MD   BP 174/71 mmHg  Pulse 79  Temp(Src) 98 F (36.7 C) (Oral)  Resp 13  SpO2 97% Physical Exam  Constitutional: He is oriented to person, place, and time.  He appears well-developed and well-nourished. No distress.  HENT:  Head: Normocephalic and atraumatic.  Mouth/Throat: Oropharynx is clear and moist.  Eyes: EOM are normal. Pupils are equal, round, and reactive to light.  Neck: Normal range of motion. Neck supple.  Cardiovascular: Normal rate and regular rhythm.   Pulmonary/Chest: Effort normal and breath sounds normal. No respiratory distress. He has no wheezes. He has no rales. He exhibits no tenderness.  Abdominal: Soft. Bowel sounds are normal. He exhibits no distension and no mass. There is no tenderness. There is no rebound and no guarding.  Musculoskeletal: Normal range of motion. He exhibits no edema or tenderness.  Neurological: He is alert and oriented to person, place, and time.  5/5 motor in all extremities. Sensation is intact. Clear speech.  Skin: Skin is warm and dry. No rash noted. No erythema.  Psychiatric: He has a normal mood and affect. His behavior is normal.  Nursing note and vitals reviewed.   ED Course  Procedures (including critical care time) DIAGNOSTIC STUDIES: Oxygen Saturation is 96% on room air, normal by my interpretation.    COORDINATION OF CARE: 3:40 AM Discussed treatment plan with patient at beside, the patient agrees with the plan and has no further questions at this time.   Labs Review Labs Reviewed  COMPREHENSIVE METABOLIC PANEL - Abnormal; Notable for the following:    Glucose, Bld 109 (*)    GFR calc non Af Amer 78 (*)    All other components within normal limits  CBC WITH DIFFERENTIAL  TROPONIN I  I-STAT TROPOININ, ED    Imaging Review No results found.   EKG Interpretation None      MDM   Final diagnoses:  Elevated blood pressure  Chest pressure    I personally performed the services described in this documentation, which was scribed in my presence. The recorded information has been reviewed and is accurate.   Patient remained symptom-free in the emergency department.  Blood pressures improved. Troponin 2 is normal. Patient is advised to follow-up with  his primary doctor to monitor blood pressure. Is also advised to take medications as prescribed. Return precautions have been given.  Julianne Rice, MD 03/21/14 [phone]

## 2014-03-21 NOTE — ED Notes (Signed)
Discharge instructions reviewed with pt. Pt verbalized understanding.   

## 2014-03-21 NOTE — ED Notes (Signed)
C/o BP elevated x 24 hours.  Also reports chills- pt states he always gets chills when BP is elevated.  Taking BP medication as instructed.

## 2014-03-23 ENCOUNTER — Ambulatory Visit: Payer: Medicare Other | Admitting: Internal Medicine

## 2014-03-25 ENCOUNTER — Other Ambulatory Visit: Payer: Self-pay

## 2014-03-25 MED ORDER — LOSARTAN POTASSIUM 100 MG PO TABS: 100.0000 mg | ORAL_TABLET | Freq: Every day | ORAL | Status: DC

## 2014-03-26 ENCOUNTER — Ambulatory Visit (INDEPENDENT_AMBULATORY_CARE_PROVIDER_SITE_OTHER)
Admission: RE | Admit: 2014-03-26 | Discharge: 2014-03-26 | Disposition: A | Discharge status: Home / Self Care | Payer: Medicare Other | Source: Ambulatory Visit | Attending: Internal Medicine | Admitting: Internal Medicine

## 2014-03-26 ENCOUNTER — Other Ambulatory Visit: Payer: Self-pay | Admitting: *Deleted

## 2014-03-26 ENCOUNTER — Ambulatory Visit (INDEPENDENT_AMBULATORY_CARE_PROVIDER_SITE_OTHER): Payer: Medicare Other | Admitting: Internal Medicine

## 2014-03-26 ENCOUNTER — Encounter: Payer: Self-pay | Admitting: Internal Medicine

## 2014-03-26 VITALS — BP 128/50 | HR 51 | Temp 97.3°F | Wt 152.0 lb

## 2014-03-26 DIAGNOSIS — I10 Essential (primary) hypertension: Secondary | ICD-10-CM

## 2014-03-26 DIAGNOSIS — R9389 Abnormal findings on diagnostic imaging of other specified body structures: Secondary | ICD-10-CM

## 2014-03-26 DIAGNOSIS — R938 Abnormal findings on diagnostic imaging of other specified body structures: Secondary | ICD-10-CM

## 2014-03-26 DIAGNOSIS — R9431 Abnormal electrocardiogram [ECG] [EKG]: Secondary | ICD-10-CM

## 2014-03-26 MED ORDER — LOSARTAN POTASSIUM 100 MG PO TABS: 100.0000 mg | ORAL_TABLET | Freq: Every day | ORAL | Status: DC

## 2014-03-26 MED ORDER — HYDRALAZINE HCL 25 MG PO TABS: ORAL_TABLET | ORAL | Status: DC

## 2014-03-26 MED ORDER — CLONIDINE HCL 0.3 MG PO TABS: 0.3000 mg | ORAL_TABLET | Freq: Two times a day (BID) | ORAL | Status: DC

## 2014-03-26 NOTE — Progress Notes (Signed)
   Subjective:    Patient ID: Stephen Door., male    DOB: May 16, 1930, 80 y.o.   MRN: [id number]  HPI  Since October he has been in the emergency room at [facility] and most recently here 03/21/14 for accelerated hypertension. He stated his blood pressure was as high as 237/119. This has been associated with headache and one episode of chest tightness.  He denies any constellation of flushing or diarrhea with the hypertension.  ER records were reviewed. EKG revealed incomplete right bundle branch block and poor R-wave in V2-3. There were no ischemic ST-T changes. Troponin was negative. CBC and differential was also normal. He does have history of anemia  GFR was minimally reduced at 78. Random glucose was 109.  His last chest x-ray was 01/01/14; at that time there was a right lower lobe infiltrate and mild cardiomegaly.    Review of Systems   Since emergency room evaluation he's had some edema; his shortness of breath has been stable. Otherwise he denies active cardiopulmonary symptoms.  He denies anxiety but GAD is listed on Problem List.     Objective:   Physical Exam  Pertinent or positive findings include:  He has beard and mustache Alopecia totalis He has an upper plate;he  is not wearing the lower denture There is excess curvature of the thoracic spine. He has bilateral carotid bruits louder on the left than the right Heart sounds are distant. A faint systolic murmurs heard There are minor rales in his bases. He has a large irregular ganglion over the left index finger Dorsalis pedis pulses are decreased.  General appearance :adequately nourished; in no distress. Eyes: No conjunctival inflammation or scleral icterus is present. Oral exam: Lips and gums are healthy appearing.There is no oropharyngeal erythema or exudate noted.  Heart:  Normal rate and regular rhythm. S1 and S2 normal without gallop, click, rub or other extra sounds   Lungs:.No increased work of  breathing.  Abdomen: bowel sounds normal, soft and non-tender without masses, organomegaly or hernias noted.  No guarding or rebound.  Vascular : all pulses equal ; no bruits present. Skin:Warm & dry.  Intact without suspicious lesions or rashes ; no jaundice or tenting Lymphatic: No lymphadenopathy is noted about the head, neck, axilla            Assessment & Plan:  See Current Assessment & Plan in Problem List under specific Diagnosis

## 2014-03-26 NOTE — Patient Instructions (Signed)
Collect all urine for 24 hours to assess increased production of stress hormones (metanephrines and catecholamines) if BP rises again > 160/100. Hydralazine 25 mg every 6 hours as needed for such BP rise.

## 2014-03-26 NOTE — Assessment & Plan Note (Signed)
Pheo evaluation if accelerated HTN recurs PRN hydralazine

## 2014-03-26 NOTE — Telephone Encounter (Signed)
Pt saw Dr. Linna Darner today he is needing refills on catapress, questran and losartan. Inform wife will send to pharmacy.../lmb   Questran already been fill by his GI MD.../lmb

## 2014-03-26 NOTE — Assessment & Plan Note (Signed)
CXray 

## 2014-03-26 NOTE — Progress Notes (Signed)
Pre visit review using our clinic review tool, if applicable. No additional management support is needed unless otherwise documented below in the visit note. 

## 2014-04-06 ENCOUNTER — Encounter: Payer: Self-pay | Admitting: Internal Medicine

## 2014-04-06 ENCOUNTER — Ambulatory Visit (INDEPENDENT_AMBULATORY_CARE_PROVIDER_SITE_OTHER): Payer: Medicare Other | Admitting: Internal Medicine

## 2014-04-06 VITALS — BP 150/102 | HR 61 | Temp 97.6°F | Wt 156.0 lb

## 2014-04-06 DIAGNOSIS — I5032 Chronic diastolic (congestive) heart failure: Secondary | ICD-10-CM

## 2014-04-06 DIAGNOSIS — I10 Essential (primary) hypertension: Secondary | ICD-10-CM

## 2014-04-06 DIAGNOSIS — R609 Edema, unspecified: Secondary | ICD-10-CM

## 2014-04-06 MED ORDER — DILTIAZEM HCL ER COATED BEADS 120 MG PO CP24
120.0000 mg | ORAL_CAPSULE | Freq: Every day | ORAL | Status: DC
Start: 2014-04-06 — End: 2014-04-22

## 2014-04-06 MED ORDER — TORSEMIDE 20 MG PO TABS: 20.0000 mg | ORAL_TABLET | Freq: Every day | ORAL | Status: DC | PRN

## 2014-04-06 MED ORDER — DILTIAZEM HCL ER COATED BEADS 120 MG PO CP24: 120.0000 mg | ORAL_CAPSULE | Freq: Every day | ORAL | Status: DC

## 2014-04-06 NOTE — Patient Instructions (Signed)
Start Cardizem CD 120 mg/day Use Demadex Low salt diet

## 2014-04-06 NOTE — Progress Notes (Signed)
Pre visit review using our clinic review tool, if applicable. No additional management support is needed unless otherwise documented below in the visit note. 

## 2014-04-06 NOTE — Assessment & Plan Note (Signed)
Use demadex NAS diet Reduce Carrdizem CD Consider adding HCTZ

## 2014-04-06 NOTE — Progress Notes (Signed)
Subjective:     HPI  C/o leg swelling - wheeping, SOB, Wt gain  Wt Readings from Last 3 Encounters:  04/06/14 156 lb (70.761 kg)  03/26/14 152 lb (68.947 kg)  01/07/14 143 lb 8 oz (65.091 kg)   BP Readings from Last 3 Encounters:  04/06/14 150/102  03/26/14 128/50  03/21/14 174/71       Since October he has been in the emergency room at [facility] and most recently here 03/21/14 for accelerated hypertension. He stated his blood pressure was as high as 237/119. This has been associated with headache and one episode of chest tightness.  He denies any constellation of flushing or diarrhea with the hypertension.  ER records were reviewed. EKG revealed incomplete right bundle branch block and poor R-wave in V2-3. There were no ischemic ST-T changes. Troponin was negative. CBC and differential was also normal. He does have history of anemia  GFR was minimally reduced at 78. Random glucose was 109.  His last chest x-ray was 01/01/14; at that time there was a right lower lobe infiltrate and mild cardiomegaly.    Review of Systems   Since emergency room evaluation he's had some edema; his shortness of breath has been stable. Otherwise he denies active cardiopulmonary symptoms.   Review of Systems  Constitutional: Negative for appetite change, fatigue and unexpected weight change.  HENT: Negative for congestion, nosebleeds, sneezing, sore throat and trouble swallowing.   Eyes: Negative for itching and visual disturbance.  Respiratory: Positive for shortness of breath. Negative for cough.   Cardiovascular: Positive for leg swelling. Negative for chest pain and palpitations.  Gastrointestinal: Negative for nausea, diarrhea, blood in stool and abdominal distention.  Genitourinary: Negative for frequency and hematuria.  Musculoskeletal: Negative for back pain, joint swelling, gait problem and neck pain.  Skin: Negative for rash.  Neurological: Negative for dizziness, tremors,  speech difficulty and weakness.  Psychiatric/Behavioral: Negative for suicidal ideas, sleep disturbance, dysphoric mood and agitation. The patient is not nervous/anxious.        Objective:   Physical Exam  Constitutional: He is oriented to person, place, and time. He appears well-developed. No distress.  NAD  HENT:  Mouth/Throat: Oropharynx is clear and moist.  Eyes: Conjunctivae are normal. Pupils are equal, round, and reactive to light.  Neck: Normal range of motion. No JVD present. No thyromegaly present.  Cardiovascular: Normal rate, regular rhythm, normal heart sounds and intact distal pulses.  Exam reveals no gallop and no friction rub.   No murmur heard. Pulmonary/Chest: Effort normal and breath sounds normal. No respiratory distress. He has no wheezes. He has no rales. He exhibits no tenderness.  Abdominal: Soft. Bowel sounds are normal. He exhibits no distension and no mass. There is no tenderness. There is no rebound and no guarding.  Musculoskeletal: Normal range of motion. He exhibits edema. He exhibits no tenderness.  Trace edema  Lymphadenopathy:    He has no cervical adenopathy.  Neurological: He is alert and oriented to person, place, and time. He has normal reflexes. No cranial nerve deficit. He exhibits normal muscle tone. He displays a negative Romberg sign. Coordination and gait normal.  No meningeal signs  Skin: Skin is warm and dry. No rash noted.  Psychiatric: He has a normal mood and affect. His behavior is normal. Judgment and thought content normal.    Lab Results  Component Value Date   WBC 6.9 03/21/2014   HGB 13.0 03/21/2014   HCT 39.0 03/21/2014   PLT  160 03/21/2014   GLUCOSE 109* 03/21/2014   CHOL 81 10/24/2013   TRIG 80.0 10/24/2013   HDL 27.80* 10/24/2013   LDLCALC 37 10/24/2013   ALT 25 03/21/2014   AST 21 03/21/2014   NA 143 03/21/2014   K 4.1 03/21/2014   CL 105 03/21/2014   CREATININE 0.89 03/21/2014   BUN 16 03/21/2014   CO2 23  03/21/2014   TSH 1.170 03/09/2013   PSA 0.62 10/10/2010   INR 1.19 05/04/2013   HGBA1C 6.4 10/16/2012         Assessment & Plan:

## 2014-04-06 NOTE — Assessment & Plan Note (Signed)
Use demadex NAS diet Reduce Carrdizem CD dose Consider adding HCTZ Use CPAP

## 2014-04-06 NOTE — Assessment & Plan Note (Signed)
See Rx adjustment

## 2014-04-07 ENCOUNTER — Other Ambulatory Visit: Payer: Self-pay

## 2014-04-07 DIAGNOSIS — I10 Essential (primary) hypertension: Secondary | ICD-10-CM

## 2014-04-07 MED ORDER — CLONIDINE HCL 0.3 MG PO TABS: 0.3000 mg | ORAL_TABLET | Freq: Two times a day (BID) | ORAL | Status: DC

## 2014-04-07 MED ORDER — LOSARTAN POTASSIUM 100 MG PO TABS: 100.0000 mg | ORAL_TABLET | Freq: Every day | ORAL | Status: DC

## 2014-04-15 DIAGNOSIS — L821 Other seborrheic keratosis: Secondary | ICD-10-CM | POA: Diagnosis not present

## 2014-04-15 DIAGNOSIS — L570 Actinic keratosis: Secondary | ICD-10-CM | POA: Diagnosis not present

## 2014-04-16 ENCOUNTER — Telehealth: Payer: Self-pay | Admitting: Internal Medicine

## 2014-04-16 NOTE — Telephone Encounter (Signed)
Spoke to [employer]. She is trying to have isosorbide-hydrALAZINE (BIDIL) 20-37.5 MG per tablet  filled. It is on backorder for the forseeable future and no one can fill it. She is requesting to speak to you to determine an alternative so that patient has BP meds.

## 2014-04-20 ENCOUNTER — Other Ambulatory Visit: Payer: Self-pay | Admitting: Cardiology

## 2014-04-21 NOTE — Telephone Encounter (Signed)
Should this be sent to pcp? I dont see where Dr Percival Spanish has ever checked patients lipids. Please advise. Thanks, MI

## 2014-04-22 ENCOUNTER — Ambulatory Visit (INDEPENDENT_AMBULATORY_CARE_PROVIDER_SITE_OTHER): Payer: Medicare Other | Admitting: Internal Medicine

## 2014-04-22 ENCOUNTER — Encounter: Payer: Self-pay | Admitting: Internal Medicine

## 2014-04-22 VITALS — BP 162/80 | HR 73 | Temp 97.7°F | Ht 62.0 in | Wt 151.0 lb

## 2014-04-22 DIAGNOSIS — M810 Age-related osteoporosis without current pathological fracture: Secondary | ICD-10-CM | POA: Diagnosis not present

## 2014-04-22 DIAGNOSIS — F411 Generalized anxiety disorder: Secondary | ICD-10-CM

## 2014-04-22 DIAGNOSIS — I10 Essential (primary) hypertension: Secondary | ICD-10-CM | POA: Insufficient documentation

## 2014-04-22 DIAGNOSIS — I4891 Unspecified atrial fibrillation: Secondary | ICD-10-CM | POA: Diagnosis not present

## 2014-04-22 MED ORDER — CHOLESTYRAMINE 4 G PO PACK: 4.0000 g | PACK | Freq: Two times a day (BID) | ORAL | Status: DC | PRN

## 2014-04-22 MED ORDER — DENOSUMAB 60 MG/ML ~~LOC~~ SOLN
60.0000 mg | Freq: Once | SUBCUTANEOUS | Status: AC
  Administered 2014-04-22: 60 mg via SUBCUTANEOUS

## 2014-04-22 MED ORDER — BISOPROLOL FUMARATE 10 MG PO TABS: 5.0000 mg | ORAL_TABLET | Freq: Two times a day (BID) | ORAL | Status: DC

## 2014-04-22 MED ORDER — HYDRALAZINE HCL 25 MG PO TABS: 25.0000 mg | ORAL_TABLET | Freq: Four times a day (QID) | ORAL | Status: DC

## 2014-04-22 MED ORDER — DILTIAZEM HCL ER COATED BEADS 120 MG PO CP24
120.0000 mg | ORAL_CAPSULE | Freq: Two times a day (BID) | ORAL | Status: DC
Start: 2014-04-22 — End: 2014-10-12

## 2014-04-22 MED ORDER — TORSEMIDE 20 MG PO TABS: 60.0000 mg | ORAL_TABLET | Freq: Every day | ORAL | Status: DC

## 2014-04-22 MED ORDER — ISOSORBIDE MONONITRATE ER 60 MG PO TB24: 60.0000 mg | ORAL_TABLET | Freq: Every day | ORAL | Status: DC

## 2014-04-22 NOTE — Progress Notes (Signed)
Subjective:    Patient ID: [name]., male    DOB: 09-21-1930, 81 y.o.   MRN: [id number]  HPI  Patient here for follow up Reviewed chronic medical issues and interval medical events  Past Medical History  Diagnosis Date  . CORONARY HEART DISEASE     a. s/p CABG;  b.  cath 06/27/10: S-Dx occluded, S-PDA 80-90% (tx with PCI); S-OM ok, L-LAD ok;  EF 65-70%  c.  s/p Promus DES to S-PDA 06/2010;   d. Myoview 8/12: low risk  . FIBRILLATION, ATRIAL     post op; ?documented during hosp. 06/2010  . SLEEP APNEA 09/2001    NPSG AHI 22/HR  . ALLERGIC RHINITIS   . ASTHMA   . Depression   . GERD     with HH, hx esophageal stricture  . DYSLIPIDEMIA   . HYPERTENSION     Echo 3/12: EF 55-60%; mod LVH; mild AS/AI; LAE; PASP 38; mild pulmo HTN  . Personal history of alcoholism   . Diverticulosis   . Benign liver cyst   . Skin cancer     L forearm  . Cataract     surgery to both eyes  . Esophageal stricture   . Hiatal hernia   . Pneumonia 04/2013 hosp  . Shortness of breath     Review of Systems  Respiratory: Positive for shortness of breath (chronic, slightly worse than "baseline" x 1 week). Negative for cough and chest tightness.   Cardiovascular: Positive for leg swelling (feet). Negative for palpitations.       Objective:   Physical Exam  BP 162/80 mmHg  Pulse 73  Temp(Src) 97.7 F (36.5 C) (Oral)  Ht 5\' 2"  (1.575 m)  Wt 151 lb (68.493 kg)  BMI 27.61 kg/m2  SpO2 96% Wt Readings from Last 3 Encounters:  04/22/14 151 lb (68.493 kg)  04/06/14 156 lb (70.761 kg)  03/26/14 152 lb (68.947 kg)   Constitutional: he is overweight, appears well-developed and well-nourished. No distress. spouse at side Neck: Normal range of motion. Neck supple. No JVD present. No thyromegaly present.  Cardiovascular: Normal rate, regular rhythm and normal heart sounds.  No murmur heard. trace R>L ankle edema. Pulmonary/Chest: Effort normal and breath sounds diminished at bases. No  respiratory distress. he has no wheezes.  Psychiatric: he has a normal mood and affect. His behavior is normal. Judgment and thought content normal.   Lab Results  Component Value Date   WBC 6.9 03/21/2014   HGB 13.0 03/21/2014   HCT 39.0 03/21/2014   PLT 160 03/21/2014   GLUCOSE 109* 03/21/2014   CHOL 81 10/24/2013   TRIG 80.0 10/24/2013   HDL 27.80* 10/24/2013   LDLCALC 37 10/24/2013   ALT 25 03/21/2014   AST 21 03/21/2014   NA 143 03/21/2014   K 4.1 03/21/2014   CL 105 03/21/2014   CREATININE 0.89 03/21/2014   BUN 16 03/21/2014   CO2 23 03/21/2014   TSH 1.170 03/09/2013   PSA 0.62 10/10/2010   INR 1.19 05/04/2013   HGBA1C 6.4 10/16/2012    Dg Chest 2 View  03/26/2014   CLINICAL DATA:  Routine, no complaints, history asthma, hypertension, coronary artery disease post CABG, pneumonia, skin cancer  EXAM: CHEST  2 VIEW  COMPARISON:  01/01/2014  FINDINGS: Enlargement of cardiac silhouette post CABG.  Atherosclerotic calcification aorta.  Mediastinal contours and pulmonary vascularity normal.  Lungs appear emphysematous with minimal atelectasis or scarring at LEFT base.  No acute infiltrate,  pleural effusion or pneumothorax.  Bones unremarkable.  IMPRESSION: Mild enlargement of cardiac silhouette post CABG.  Emphysematous changes with minimal atelectasis or scarring at LEFT base.   Electronically Signed   By: Lavonia Dana M.D.   On: 03/26/2014 18:04       Assessment & Plan:   Problem List Items Addressed This Visit    Accelerated hypertension - Primary    Labile and uncontrolled Exacerbated by anxiety Also exacerbation with hosp for rapid A. Fib 02/2013: Amlodipine discontinued in place of diltiazem 04/2012 hosp for RVR Reviewed episode of bradycardia during 02/2013 hospitalization and cards visit 03/2013 - off Dig and decreaded Dilt because of same Bidil started hospitalization 06/2012 -titrate up now to TID 02/2013 Because of lack of availability of Bidil, will change in 2  separate components: hydralazine and Imdur -new prescriptions done today (04/2014) also on Clonidine 0.2mg  twice daily, max ARB, Zebeta bid Stopped lasix because of itching -but tolerating torsemide -continue at current dosing patient and wife will monitor at home for mgmt of same -  educated on goal: 140s/80s - to check BP only 2x/day! Also addressed anxiety component - see next  BP Readings from Last 3 Encounters:  04/22/14 162/80  04/06/14 150/102  03/26/14 128/50        Relevant Medications   diltiazem (CARDIZEM CD) 24 hr capsule   bisoprolol (ZEBETA) tablet   hydrALAZINE (APRESOLINE) tablet   isosorbide mononitrate (IMDUR) 24 hr tablet   torsemide (DEMADEX) tablet   cholestyramine (QUESTRAN) 4 G packet   Atrial fibrillation with RVR    hosp for same 02/2013, 03/2013 and 06/2013- reviewed highly symptomatic RVR: fatigue and dyspnea (but improved with normalized HR) Would consider tykosin if needed as other med options limitied (cards consult reviewed) reviewed current dilt dose + Zebeta - avoid other beta-blocker due to adv pulm disease Briefly on dig 0.125 prn HR>100 (given lack of other med choices), but stopped due to bradycardia conitnue follow up closely with cards - continue anticoag - eliquis - in place of prior plavix +[id number] (since 03/10/13 DC) Echo 03/09/13: normal LV fx, no cor pulmonale; CT angio neg for PE and no hypoxia By report patient maintaining SR      Relevant Medications   diltiazem (CARDIZEM CD) 24 hr capsule   bisoprolol (ZEBETA) tablet   hydrALAZINE (APRESOLINE) tablet   isosorbide mononitrate (IMDUR) 24 hr tablet   torsemide (DEMADEX) tablet   cholestyramine (QUESTRAN) 4 G packet   Generalized anxiety disorder    Chronic symptoms, exacerbated by fear of uncontrolled blood pressure and symptomatic dyspnea On scheduled benzodiazepine, titrate up to 3 times a day 08/2013 to help blood pressure management Reminded of importance of compliance with same  for optimal blood pressure control      Osteoporosis    No hx fracture, freq pred use due to COPD Discussed med options for tx 05/2012 -  given hx esophageal stricture, recommended to begin Prolia injections -  Enrolled with patient support program -  1st dose done 05/16/13, repeat q 44mo -due today (04/2014) Continue WB exercise activity and take Ca+D BID Education on same provided at length today

## 2014-04-22 NOTE — Assessment & Plan Note (Signed)
Chronic symptoms, exacerbated by fear of uncontrolled blood pressure and symptomatic dyspnea On scheduled benzodiazepine, titrate up to 3 times a day 08/2013 to help blood pressure management Reminded of importance of compliance with same for optimal blood pressure control

## 2014-04-22 NOTE — Patient Instructions (Addendum)
It was good to see you today.  We have reviewed your prior records including labs and tests today  Medications reviewed and updated  See recommendations and changes as listed below. Remember your lorazepam is a blood pressure medicine for you, not just anxiety!  Prolia shots for bones given today  Please schedule followup in 3-4 months, call sooner if problems.

## 2014-04-22 NOTE — Assessment & Plan Note (Signed)
hosp for same 02/2013, 03/2013 and 06/2013- reviewed highly symptomatic RVR: fatigue and dyspnea (but improved with normalized HR) Would consider tykosin if needed as other med options limitied (cards consult reviewed) reviewed current dilt dose + Zebeta - avoid other beta-blocker due to adv pulm disease Briefly on dig 0.125 prn HR>100 (given lack of other med choices), but stopped due to bradycardia conitnue follow up closely with cards - continue anticoag - eliquis - in place of prior plavix +[id number] (since 03/10/13 DC) Echo 03/09/13: normal LV fx, no cor pulmonale; CT angio neg for PE and no hypoxia By report patient maintaining SR

## 2014-04-22 NOTE — Assessment & Plan Note (Signed)
No hx fracture, freq pred use due to COPD Discussed med options for tx 05/2012 -  given hx esophageal stricture, recommended to begin Prolia injections -  Enrolled with patient support program -  1st dose done 05/16/13, repeat q 46mo -due today (04/2014) Continue WB exercise activity and take Ca+D BID Education on same provided at length today

## 2014-04-22 NOTE — Assessment & Plan Note (Signed)
Labile and uncontrolled Exacerbated by anxiety Also exacerbation with hosp for rapid A. Fib 02/2013: Amlodipine discontinued in place of diltiazem 04/2012 hosp for RVR Reviewed episode of bradycardia during 02/2013 hospitalization and cards visit 03/2013 - off Dig and decreaded Dilt because of same Bidil started hospitalization 06/2012 -titrate up now to TID 02/2013 Because of lack of availability of Bidil, will change in 2 separate components: hydralazine and Imdur -new prescriptions done today (04/2014) also on Clonidine 0.2mg  twice daily, max ARB, Zebeta bid Stopped lasix because of itching -but tolerating torsemide -continue at current dosing patient and wife will monitor at home for mgmt of same -  educated on goal: 140s/80s - to check BP only 2x/day! Also addressed anxiety component - see next  BP Readings from Last 3 Encounters:  04/22/14 162/80  04/06/14 150/102  03/26/14 128/50

## 2014-04-22 NOTE — Progress Notes (Signed)
Pre visit review using our clinic review tool, if applicable. No additional management support is needed unless otherwise documented below in the visit note. 

## 2014-04-24 ENCOUNTER — Ambulatory Visit (INDEPENDENT_AMBULATORY_CARE_PROVIDER_SITE_OTHER): Payer: Medicare Other | Admitting: Cardiology

## 2014-04-24 ENCOUNTER — Encounter: Payer: Self-pay | Admitting: Cardiology

## 2014-04-24 VITALS — BP 150/62 | HR 52 | Ht 62.0 in | Wt 154.4 lb

## 2014-04-24 DIAGNOSIS — I2510 Atherosclerotic heart disease of native coronary artery without angina pectoris: Secondary | ICD-10-CM

## 2014-04-24 DIAGNOSIS — I2583 Coronary atherosclerosis due to lipid rich plaque: Principal | ICD-10-CM

## 2014-04-24 MED ORDER — NEBIVOLOL HCL 10 MG PO TABS: 10.0000 mg | ORAL_TABLET | Freq: Every day | ORAL | Status: DC

## 2014-04-24 NOTE — Progress Notes (Signed)
HPI The patient presents for follow up of  CAD, s/p CABG, s/p Promus DES to S-PDA in 06/2010.   He has also had atrial fib AFib.  He returns for routine follow-up. I do see that he was in the emergency room in December with hypertension. I reviewed these records.  Unfortunately he's had a very difficult time with his blood pressure. He he is having trouble getting BiDil.  He says his blood pressures often running 203/101.  He denies any chest pressure, neck or arm discomfort. He's had no new palpitations, presyncope or syncope. He's had no PND or orthopnea. He has had fatigue.   Allergies  Allergen Reactions  . Ace Inhibitors Other (See Comments)    Severe asthma (COPD)  . Codeine Nausea And Vomiting  . Lasix [Furosemide] Itching    Current Outpatient Prescriptions  Medication Sig Dispense Refill  . albuterol (PROVENTIL HFA;VENTOLIN HFA) 108 (90 BASE) MCG/ACT inhaler Inhale 2 puffs into the lungs every 6 (six) hours as needed for wheezing or shortness of breath.     Marland Kitchen apixaban (ELIQUIS) 5 MG TABS tablet Take 1 tablet (5 mg total) by mouth 2 (two) times daily. 60 tablet 6  . bisoprolol (ZEBETA) 10 MG tablet Take 0.5-1 tablets (5-10 mg total) by mouth 2 (two) times daily. Take 10mg  in the morning and 5mg  in the evening. 45 tablet 12  . budesonide (PULMICORT) 0.25 MG/2ML nebulizer solution Take 0.25 mg by nebulization 2 (two) times daily as needed. For asthma    . calcium-vitamin D (OSCAL WITH D) 500-200 MG-UNIT per tablet Take 1 tablet by mouth 2 (two) times daily.    . cholestyramine (QUESTRAN) 4 G packet Take 1 packet (4 g total) by mouth 2 (two) times daily as needed. 60 each 2  . cloNIDine (CATAPRES) 0.3 MG tablet Take 1 tablet (0.3 mg total) by mouth 2 (two) times daily. 60 tablet 11  . denosumab (PROLIA) 60 MG/ML SOLN injection Inject 60 mg into the skin every 6 (six) months. Administer in upper arm, thigh, or abdomen 1 mL 5  . diltiazem (CARDIZEM CD) 120 MG 24 hr capsule Take 1 capsule  (120 mg total) by mouth 2 (two) times daily. 60 capsule 5  . ferrous sulfate 325 (65 FE) MG tablet take 1 tablet by mouth three times a day with food 90 tablet 3  . hydrALAZINE (APRESOLINE) 25 MG tablet Take 1 tablet (25 mg total) by mouth 4 (four) times daily. 120 tablet 5  . ipratropium-albuterol (DUONEB) 0.5-2.5 (3) MG/3ML SOLN Take 3 mLs by nebulization every 6 (six) hours as needed (for shortness of breath). 360 mL 6  . isosorbide mononitrate (IMDUR) 60 MG 24 hr tablet Take 1 tablet (60 mg total) by mouth daily. 90 tablet 3  . latanoprost (XALATAN) 0.005 % ophthalmic solution Place 1 drop into both eyes at bedtime.     Marland Kitchen LORazepam (ATIVAN) 0.5 MG tablet Take 1 tablet (0.5 mg total) by mouth 3 (three) times daily. 90 tablet 5  . losartan (COZAAR) 100 MG tablet Take 1 tablet (100 mg total) by mouth daily. 30 tablet 11  . mometasone-formoterol (DULERA) 100-5 MCG/ACT AERO Inhale 2 puffs into the lungs 2 (two) times daily.    . montelukast (SINGULAIR) 10 MG tablet Take 10 mg by mouth at bedtime.    . nitroGLYCERIN (NITROSTAT) 0.4 MG SL tablet Place 0.4 mg under the tongue every 5 (five) minutes as needed for chest pain.    . pantoprazole (PROTONIX) 40  MG tablet take 1 tablet by mouth twice a day 60 tablet 0  . potassium chloride SA (K-DUR,KLOR-CON) 20 MEQ tablet Take 20 mEq by mouth 2 (two) times daily.    . pravastatin (PRAVACHOL) 80 MG tablet Take 80 mg by mouth every evening.    . Probiotic Product (PHILLIPS COLON HEALTH PO) Take 1 capsule by mouth daily.    Marland Kitchen RA VITAMIN B-12 TR 1000 MCG TBCR take 1 tablet by mouth once daily 30 tablet 11  . torsemide (DEMADEX) 20 MG tablet Take 3 tablets (60 mg total) by mouth daily. 90 tablet 5   No current facility-administered medications for this visit.    Past Medical History  Diagnosis Date  . CORONARY HEART DISEASE     a. s/p CABG;  b.  cath 06/27/10: S-Dx occluded, S-PDA 80-90% (tx with PCI); S-OM ok, L-LAD ok;  EF 65-70%  c.  s/p Promus DES to  S-PDA 06/2010;   d. Myoview 8/12: low risk  . FIBRILLATION, ATRIAL     post op; ?documented during hosp. 06/2010  . SLEEP APNEA 09/2001    NPSG AHI 22/HR  . ALLERGIC RHINITIS   . ASTHMA   . Depression   . GERD     with HH, hx esophageal stricture  . DYSLIPIDEMIA   . HYPERTENSION     Echo 3/12: EF 55-60%; mod LVH; mild AS/AI; LAE; PASP 38; mild pulmo HTN  . Personal history of alcoholism   . Diverticulosis   . Benign liver cyst   . Skin cancer     L forearm  . Cataract     surgery to both eyes  . Esophageal stricture   . Hiatal hernia   . Pneumonia 04/2013 hosp  . Shortness of breath     Past Surgical History  Procedure Laterality Date  . Coronary artery bypass graft  02/2007  . Tonsillectomy    . Coronary angioplasty with stent placement  07/2010  . Cataract extraction, bilateral  2007  . Hernia repair  unsure ?60's  . Cholecystectomy  02/21/2011    Procedure: LAPAROSCOPIC CHOLECYSTECTOMY WITH INTRAOPERATIVE CHOLANGIOGRAM;  Surgeon: Pedro Earls, MD;  Location: [facility];  Service: General;  Laterality: N/A;    ROS:  As stated in the HPI and negative for all other systems.  PHYSICAL EXAM BP 150/62 mmHg  Pulse 52  Ht 5\' 2"  (1.575 m)  Wt 154 lb 6.4 oz (70.035 kg)  BMI 28.23 kg/m2 GENERAL:  Chronically ill appearing but in no acute distress HEENT:  Pupils equal round and reactive, fundi not visualized, oral mucosa unremarkable, dentures NECK:  No jugular venous distention, waveform within normal limits, carotid upstroke brisk and symmetric, left carotid bruits, no thyromegaly LUNGS:  Clear to auscultation bilaterally CHEST:  Well healed sternotomy scar. HEART:  PMI not displaced or sustained,S1 and S2 within normal limits, no S3, no S4, no clicks, no rubs, soft apical, right upper sterna border early peaking murmur ABD:  Flat, positive bowel sounds normal in frequency in pitch, no bruits, no rebound, no guarding, no midline pulsatile mass, no hepatomegaly, no splenomegaly,  healed abdominal scars. EXT:  2 plus pulses throughout, mild bilateral edema, no cyanosis no clubbing  EKG:  Sinus rhythm, rate 52 menisci were in the emergency room and 7 with some blood pressure been high since then, first degree AV block, right bundle branch block incomplete, no acute ST-T wave changes.  04/24/2014  ASSESSMENT AND PLAN  CAD:  The patient has no new  sypmtoms.  No further cardiovascular testing is indicated.  We will continue with aggressive risk reduction and meds as listed.  ATRIAL FIBRILLATION:    He will continue with the meds as listed.   He has no Paroxysmal symptoms.  HTN: His blood pressure is not at target. I'm switching him to Bystolic and change the hydralazine to 50 mg 3 times a day.  I am going to have him wear a 24-hour blood pressure monitor in about 2 weeks.  CAROTID STENOSIS:  I reviewed the results from last year he had 40-59% bilateral stenosis.  He will have follow up in one year.

## 2014-04-24 NOTE — Patient Instructions (Signed)
Your physician recommends that you schedule a follow-up appointment in: 6 weeks with Dr. Percival Spanish  We are going to put in for you to have 24 hr BP Monitoring done in 2 weeks

## 2014-04-27 ENCOUNTER — Other Ambulatory Visit: Payer: Self-pay | Admitting: *Deleted

## 2014-04-27 MED ORDER — PRAVASTATIN SODIUM 80 MG PO TABS: 80.0000 mg | ORAL_TABLET | Freq: Every evening | ORAL | Status: DC

## 2014-04-30 NOTE — Telephone Encounter (Signed)
Ok to fill med.

## 2014-05-08 DIAGNOSIS — H401221 Low-tension glaucoma, left eye, mild stage: Secondary | ICD-10-CM | POA: Diagnosis not present

## 2014-05-08 DIAGNOSIS — H401213 Low-tension glaucoma, right eye, severe stage: Secondary | ICD-10-CM | POA: Diagnosis not present

## 2014-05-11 ENCOUNTER — Telehealth: Payer: Self-pay | Admitting: *Deleted

## 2014-05-11 NOTE — Telephone Encounter (Signed)
[place] Night - Client [place] Call Center Patient Name: [name] Gender: Male DOB: 11-Apr-1930 Age: 80 Y 2 M 11 D Return Phone Number: Address: City/State/Zip: [place] [employer] [facility] Night - Client Client Site [place] - Night Contact Type Call [place] Name [facility] Caller Phone Number n/a Relationship To Patient Spouse Is this call to report lab results? No Call Type General Information Initial Comment caller states she is returning a call from office General Information Type Call Transferred Nurse Assessment Guidelines Guideline Title Affirmed Question Affirmed Notes Nurse Date/Time (Eastern Time) Disp. Time Eilene Ghazi Time) Disposition Final User 05/09/2014 10:35:23 AM General Information Provided Yes [facility] Instructions Given Call Event Type User Date / Time Description

## 2014-05-12 ENCOUNTER — Encounter: Payer: Self-pay | Admitting: Family

## 2014-05-12 ENCOUNTER — Ambulatory Visit (INDEPENDENT_AMBULATORY_CARE_PROVIDER_SITE_OTHER): Payer: Medicare Other | Admitting: Family

## 2014-05-12 VITALS — BP 148/84 | HR 59 | Temp 97.5°F | Resp 18 | Ht 62.0 in | Wt 158.8 lb

## 2014-05-12 DIAGNOSIS — B029 Zoster without complications: Secondary | ICD-10-CM | POA: Diagnosis not present

## 2014-05-12 MED ORDER — VALACYCLOVIR HCL 1 G PO TABS: 1000.0000 mg | ORAL_TABLET | Freq: Three times a day (TID) | ORAL | Status: DC

## 2014-05-12 MED ORDER — GABAPENTIN 100 MG PO CAPS: 100.0000 mg | ORAL_CAPSULE | Freq: Three times a day (TID) | ORAL | Status: DC

## 2014-05-12 NOTE — Progress Notes (Signed)
Pre visit review using our clinic review tool, if applicable. No additional management support is needed unless otherwise documented below in the visit note. 

## 2014-05-12 NOTE — Progress Notes (Signed)
Subjective:    Patient ID: Stephen Door., male    DOB: [birth year], 81 y.o.   MRN: [id number]  Chief Complaint  Patient presents with  . Herpes Zoster    broke out on side itching and burning rash possibly shingle, happend today    HPI:  Stephen Douthat. is a 81 y.o. male who presents today for an acute visit.   Associated symptoms of the of a burning and itch rash on his right side that started today. Denies any modifying factors.   Allergies  Allergen Reactions  . Ace Inhibitors Other (See Comments)    Severe asthma (COPD)  . Codeine Nausea And Vomiting  . Lasix [Furosemide] Itching    Current Outpatient Prescriptions on File Prior to Visit  Medication Sig Dispense Refill  . albuterol (PROVENTIL HFA;VENTOLIN HFA) 108 (90 BASE) MCG/ACT inhaler Inhale 2 puffs into the lungs every 6 (six) hours as needed for wheezing or shortness of breath.     Marland Kitchen apixaban (ELIQUIS) 5 MG TABS tablet Take 1 tablet (5 mg total) by mouth 2 (two) times daily. 60 tablet 6  . budesonide (PULMICORT) 0.25 MG/2ML nebulizer solution Take 0.25 mg by nebulization 2 (two) times daily as needed. For asthma    . calcium-vitamin D (OSCAL WITH D) 500-200 MG-UNIT per tablet Take 1 tablet by mouth 2 (two) times daily.    . cholestyramine (QUESTRAN) 4 G packet Take 1 packet (4 g total) by mouth 2 (two) times daily as needed. 60 each 2  . cloNIDine (CATAPRES) 0.3 MG tablet Take 1 tablet (0.3 mg total) by mouth 2 (two) times daily. 60 tablet 11  . denosumab (PROLIA) 60 MG/ML SOLN injection Inject 60 mg into the skin every 6 (six) months. Administer in upper arm, thigh, or abdomen 1 mL 5  . diltiazem (CARDIZEM CD) 120 MG 24 hr capsule Take 1 capsule (120 mg total) by mouth 2 (two) times daily. 60 capsule 5  . ferrous sulfate 325 (65 FE) MG tablet take 1 tablet by mouth three times a day with food 90 tablet 3  . hydrALAZINE (APRESOLINE) 50 MG tablet Take 50 mg by mouth 3 (three) times daily.    Marland Kitchen  ipratropium-albuterol (DUONEB) 0.5-2.5 (3) MG/3ML SOLN Take 3 mLs by nebulization every 6 (six) hours as needed (for shortness of breath). 360 mL 6  . isosorbide mononitrate (IMDUR) 60 MG 24 hr tablet Take 1 tablet (60 mg total) by mouth daily. 90 tablet 3  . latanoprost (XALATAN) 0.005 % ophthalmic solution Place 1 drop into both eyes at bedtime.     Marland Kitchen LORazepam (ATIVAN) 0.5 MG tablet Take 1 tablet (0.5 mg total) by mouth 3 (three) times daily. 90 tablet 5  . losartan (COZAAR) 100 MG tablet Take 1 tablet (100 mg total) by mouth daily. 30 tablet 11  . mometasone-formoterol (DULERA) 100-5 MCG/ACT AERO Inhale 2 puffs into the lungs 2 (two) times daily.    . montelukast (SINGULAIR) 10 MG tablet Take 10 mg by mouth at bedtime.    . nebivolol (BYSTOLIC) 10 MG tablet Take 1 tablet (10 mg total) by mouth daily. 30 tablet 6  . nitroGLYCERIN (NITROSTAT) 0.4 MG SL tablet Place 0.4 mg under the tongue every 5 (five) minutes as needed for chest pain.    . pantoprazole (PROTONIX) 40 MG tablet take 1 tablet by mouth twice a day 60 tablet 0  . potassium chloride SA (K-DUR,KLOR-CON) 20 MEQ tablet Take 20 mEq by mouth 2 (  two) times daily.    . pravastatin (PRAVACHOL) 80 MG tablet Take 1 tablet (80 mg total) by mouth every evening. 30 tablet 3  . Probiotic Product (PHILLIPS COLON HEALTH PO) Take 1 capsule by mouth daily.    Marland Kitchen RA VITAMIN B-12 TR 1000 MCG TBCR take 1 tablet by mouth once daily 30 tablet 11  . torsemide (DEMADEX) 20 MG tablet Take 3 tablets (60 mg total) by mouth daily. 90 tablet 5   No current facility-administered medications on file prior to visit.    Review of Systems  Constitutional: Negative for fever and chills.  Skin: Positive for rash.      Objective:    BP 148/84 mmHg  Pulse 59  Temp(Src) 97.5 F (36.4 C) (Oral)  Resp 18  Ht 5\' 2"  (1.575 m)  Wt 158 lb 12.8 oz (72.031 kg)  BMI 29.04 kg/m2  SpO2 97% Nursing note and vital signs reviewed.  Physical Exam  Constitutional: He is  oriented to person, place, and time. He appears well-developed and well-nourished. No distress.  Cardiovascular: Normal rate, regular rhythm, normal heart sounds and intact distal pulses.   Pulmonary/Chest: Effort normal and breath sounds normal.  Neurological: He is alert and oriented to person, place, and time.  Skin: Skin is warm and dry. Rash noted. Rash is vesicular.     Psychiatric: He has a normal mood and affect. His behavior is normal. Judgment and thought content normal.       Assessment & Plan:

## 2014-05-12 NOTE — Assessment & Plan Note (Signed)
Symptoms and exam consistent with herpes zoster. Start Valtrex. Start gabapentin as needed for pain. Follow-up if symptoms worsen or fail to improve.

## 2014-05-12 NOTE — Patient Instructions (Signed)
Thank you for choosing [facility].  Summary/Instructions:  Your prescription(s) have been submitted to your pharmacy or been printed and provided for you. Please take as directed and contact our office if you believe you are having problem(s) with the medication(s) or have any questions.  If your symptoms worsen or fail to improve, please contact our office for further instruction, or in case of emergency go directly to the emergency room at the closest medical facility.    Shingles Shingles (herpes zoster) is an infection that is caused by the same virus that causes chickenpox (varicella). The infection causes a painful skin rash and fluid-filled blisters, which eventually break open, crust over, and heal. It may occur in any area of the body, but it usually affects only one side of the body or face. The pain of shingles usually lasts about 1 month. However, some people with shingles may develop long-term (chronic) pain in the affected area of the body. Shingles often occurs many years after the person had chickenpox. It is more common:  In people older than 50 years.  In people with weakened immune systems, such as those with HIV, AIDS, or cancer.  In people taking medicines that weaken the immune system, such as transplant medicines.  In people under great stress. CAUSES  Shingles is caused by the varicella zoster virus (VZV), which also causes chickenpox. After a person is infected with the virus, it can remain in the person's body for years in an inactive state (dormant). To cause shingles, the virus reactivates and breaks out as an infection in a nerve root. The virus can be spread from person to person (contagious) through contact with open blisters of the shingles rash. It will only spread to people who have not had chickenpox. When these people are exposed to the virus, they may develop chickenpox. They will not develop shingles. Once the blisters scab over, the person is no  longer contagious and cannot spread the virus to others. SIGNS AND SYMPTOMS  Shingles shows up in stages. The initial symptoms may be pain, itching, and tingling in an area of the skin. This pain is usually described as burning, stabbing, or throbbing.In a few days or weeks, a painful red rash will appear in the area where the pain, itching, and tingling were felt. The rash is usually on one side of the body in a band or belt-like pattern. Then, the rash usually turns into fluid-filled blisters. They will scab over and dry up in approximately 2-3 weeks. Flu-like symptoms may also occur with the initial symptoms, the rash, or the blisters. These may include:  Fever.  Chills.  Headache.  Upset stomach. DIAGNOSIS  Your health care provider will perform a skin exam to diagnose shingles. Skin scrapings or fluid samples may also be taken from the blisters. This sample will be examined under a microscope or sent to a lab for further testing. TREATMENT  There is no specific cure for shingles. Your health care provider will likely prescribe medicines to help you manage the pain, recover faster, and avoid long-term problems. This may include antiviral drugs, anti-inflammatory drugs, and pain medicines. HOME CARE INSTRUCTIONS   Take a cool bath or apply cool compresses to the area of the rash or blisters as directed. This may help with the pain and itching.   Take medicines only as directed by your health care provider.   Rest as directed by your health care provider.  Keep your rash and blisters clean with mild soap  and cool water or as directed by your health care provider.  Do not pick your blisters or scratch your rash. Apply an anti-itch cream or numbing creams to the affected area as directed by your health care provider.  Keep your shingles rash covered with a loose bandage (dressing).  Avoid skin contact with:  Babies.   Pregnant women.   Children with eczema.   Elderly  people with transplants.   People with chronic illnesses, such as leukemia or AIDS.   Wear loose-fitting clothing to help ease the pain of material rubbing against the rash.  Keep all follow-up visits as directed by your health care provider.If the area involved is on your face, you may receive a referral for a specialist, such as an eye doctor (ophthalmologist) or an ear, nose, and throat (ENT) doctor. Keeping all follow-up visits will help you avoid eye problems, chronic pain, or disability.  SEEK IMMEDIATE MEDICAL CARE IF:   You have facial pain, pain around the eye area, or loss of feeling on one side of your face.  You have ear pain or ringing in your ear.  You have loss of taste.  Your pain is not relieved with prescribed medicines.   Your redness or swelling spreads.   You have more pain and swelling.  Your condition is worsening or has changed.   You have a fever. MAKE SURE YOU:  Understand these instructions.  Will watch your condition.  Will get help right away if you are not doing well or get worse. Document Released: 03/27/2005 Document Revised: 08/11/2013 Document Reviewed: 11/09/2011 [facility] Patient Information 2015 [place], [place]. This information is not intended to replace advice given to you by your health care provider. Make sure you discuss any questions you have with your health care provider.

## 2014-05-13 ENCOUNTER — Encounter: Payer: Self-pay | Admitting: Internal Medicine

## 2014-05-13 ENCOUNTER — Ambulatory Visit (INDEPENDENT_AMBULATORY_CARE_PROVIDER_SITE_OTHER): Payer: Medicare Other | Admitting: Internal Medicine

## 2014-05-13 VITALS — BP 124/64 | HR 69 | Ht 62.0 in | Wt 147.0 lb

## 2014-05-13 DIAGNOSIS — J449 Chronic obstructive pulmonary disease, unspecified: Secondary | ICD-10-CM

## 2014-05-13 NOTE — Patient Instructions (Signed)
We can continue present meds  Sample x 2 Dulera 100  Please call as needed

## 2014-05-13 NOTE — Progress Notes (Signed)
Patient ID: [name], male    DOB: 1930-07-08, 81 y.o.   MRN: [id number]  HPI 11/16/10- 47 year old man followed for severe chronic asthma, sleep apnea, allergic rhinitis, complicated by HBP CAD history of atrial fibrillation, GERD Last here May 20, 2010 CPAP 10 all night every night- helps sleep. Asks refill Provigil to help with long drives.  Allergy vaccine GO definitely helps- he is convinced.  He notes no asthma and no prednisone since last November- credits frequent handwashing, avoiding people with colds and the allergy vaccine. No new concerns. Continues cardiac rehab.   03/03/11-  37 year old man followed for severe chronic asthma, sleep apnea, allergic rhinitis, complicated by HBP CAD history of atrial fibrillation, GERD. Wife here. He recently had his fourth hospital stay since March, for gallbladder pain, cholecystectomy, cardiac stent. Cholecystectomy was 2 weeks ago. Through these he had shortness of breath and cough which has persisted and keep him awake. His nose runs.  07/03/11- 33 year old man followed for severe chronic asthma, sleep apnea, allergic rhinitis, complicated by HBP, CAD, history of atrial fibrillation, GERD. Wife here. Since last here has been to urgent care in emergency room 4 times for exacerbations of asthma. On prednisone 10 mg/day since February from Dr [name]. Definitely having reflux events and we discussed how that may be the significant issue. Denies sinus infection. Had chest x-rays in January and February which she was told were negative. He asks to try increasing his CPAP pressure because he doesn't feel he is sleeping quite well enough. We discussed this.  07/18/11- 11 year old man followed for severe chronic asthma, sleep apnea, allergic rhinitis, complicated by HBP, CAD, history of atrial fibrillation, GERD. Wife here. Seen by Dr Daub-Increased SOB and wheezing; was put on prednisone to help keep patient out of hospital; still not feeling  much better. Has been on prednisone 60 mg daily for past 4 days but still significant shortness of breath with exertion and wheeze. No excess 2 chest x-rays have been clear and EKG "knot heart". Using home nebulizer with DuoNeb 4 times daily plus his rescue inhaler at least twice per day. Nothing seems infected. He has not recognized heartburn although that has been an issue in the past. Denies chest pain, fever, ankle edema or palpitation. Some nasal congestion without much sneezing. Has been indoors without much exposure to pollen.  08/08/11- 44 year old man followed for severe chronic asthma, sleep apnea, allergic rhinitis, complicated by HBP, CAD, history of atrial fibrillation, GERD. Wife here. Hospitalized April 11-17 with acute exacerbation of his chronic fixed asthma. Anxiety and reflux are thought to be important. He says he is "doing great" now. He feels an anxiety medication would help and we discussed this. Currently on prednisone 20 mg daily.  10/04/11-  22 year old man followed for severe chronic asthma, sleep apnea, allergic rhinitis, complicated by HBP, CAD, history of atrial fibrillation, GERD. Wife here  Pt states increase of sob,wheezing,fatigue since getting out of hospital . No hospitalizations since last here. He stays short of breath especially with exertional dyspnea but is comfortable sitting and when lying in bed. His wife points out he is able to do yard work using a [employer]. We reviewed Dr.Hochrein's cardiology note. Question, , each of his dyspnea is pulmonary versus cardiac. We were asked to draw labs at this visit. He was criticized for using his nebulizer machine too frequently before his hospitalization but has only used it a total of 7 times since then. He had remained on maintenance prednisone  since January. His wife gradually tapered it off so he has had none in the last 3 weeks. He continues, and wants to continue, allergy vaccine at 1:10, well tolerated. CXR 07/22/11-    reviewed with them: Findings:  Grossly unchanged enlarged cardiac silhouette and mediastinal  contours post median sternotomy and CABG. Atherosclerotic  calcification within the thoracic aorta. There is persistent mild  elevation of the right hemidiaphragm. Grossly unchanged bibasilar  heterogeneous opacities favored to represent atelectasis. Grossly  unchanged bones including mild compression deformity of a lower  thoracic vertebral body.  IMPRESSION:  No acute cardiopulmonary disease.  Original Report Authenticated By: Rachel Moulds, M.D.  Office Spirometry: FEV1 1.40/58%, FVC 2.44/78%, FEV1/FVC 0.57/73%, FEF 25-75% 0.56/23%.  01/08/12- 89 year old man followed for severe chronic asthma, sleep apnea, allergic rhinitis, complicated by HBP, CAD, history of atrial fibrillation, GERD. Wife here Needs refill for rescue inhaler as he is leaving for beach in am; breathing is doing pretty well; only one treatment since seeing [place] last. . Asks for an antibiotic to carry.  05/10/2012 Acute OV Complains of increased dyspnea , coughing and wheezing. This woke him up this morning and used his neb without relief then used his inhaler without any relief.  Dry cough and wheezing started last pm.  Patient denies any hemoptysis, orthopnea, PND, leg swelling, or calf pain. Patient reports that wheezes. Started 2-3 days ago and worsened. Last night. Prior to going to bed. He had started on prednisone earlier in the week. at 10 mg and is currently on 5 mg daily .  He had his rescue inhaler this am with some relief.  05/30/12- 36 year old man followed for severe chronic asthma, sleep apnea, allergic rhinitis, complicated by HBP, CAD, history of atrial fibrillation, GERD. Wife here He says he is a well now, off prednisone x2 weeks. Likes Mucinex DM. We discussed steroidsand I suggested bone density assessment. We discussed steroid sparing availability of Daliresp trial is frequency of bronchitic exacerbations.   He continues using CPAP 12/ [facility] with no concerns.  09/05/12- 5 year old man followed for severe chronic asthma, sleep apnea, allergic rhinitis, complicated by HBP, CAD, history of atrial fibrillation, GERD.     Wife here FOLLOWS FOR: had to go to UC on 08-26-12 due to breathing issues. Was given shot, neb tx, and pred taper. Had caught a cold while fishing, leading to exacerbation. Finish prednisone taper 2 days ago. Feels "great" now. Allergy vaccine 1:10 GO CPAP 12/[facility] with good compliance and control CXR 08/26/12 IMPRESSION:  No active disease.  Original Report Authenticated By: Vallery Ridge, M.D.  11/04/16 -20 year old man followed for severe chronic asthma, sleep apnea, allergic rhinitis, complicated by HBP, CAD, history of atrial fibrillation/ pacemaker , GERD.     Wife here( She has mitochondrial myopathy) FOLOWS FOR: reports has been having diff w wheezing denies any chest tightness-- states he has been  seen in [place] clinic every month for breathing and per that MD patient may need some medication readjustment   wheeze and shortness of breath, sometimes green mucus. Gets better with neb been steroid but then relapses each time. Uses home nebulizer 3 times daily but doesn't last. Blamed Advair for 2 episodes of pneumonia-discussed. They feel he is doing well with allergy vaccine 1: 10 GO. OSA- good compliance and control with CPAP 12/ [facility]..  03/12/13- 76 year old man followed for severe chronic asthma, sleep apnea, allergic rhinitis, complicated by HBP, CAD, history of atrial fibrillation/ pacemaker , GERD.     (Wife  has mitochondrial myopathy) FOLLOWS FOR: since being on Dulera 100 has not had any flare ups or had to use nebulizer machine. Recent hospital stay at [facility] for heart issues- AFib/ tachycardia.  Feels "nervous, chilly" but not sick. Little cough or wheeze. Has been able to cut way down on nebulizer and w/o need recent prednisone. Ankles swell. Note med list shows  both diltiazem and Norvasc.  07/29/13- 69 year old M former smoker(120 pk yr) followed for severe chronic asthma/ COPD, OSA/CPAP, allergic rhinitis, complicated by HBP, CAD, history of AFib/ pacemaker , GERD.     (Wife  has mitochondrial myopathy) Allergy vaccine 1:10 GO     CPAP 12/ [facility]    FOLLOWS FOR: has been in hospital 3 times since Dec 2014. Pt has had flare ups with COPD. Breathing now is at baseline. Going to gym for exercise twice weekly. He likes [employer] Would still like to keep a few Nuvigil tablets around for long drives- discussed. He asks about updating pneumonia vaccine. He is at very high risk for pneumonia and complications. We compared Pneumovax-23 with Prevnar 13 and guidelines. CXR 06/14/13 IMPRESSION:  Findings concerning for right perihilar and infrahilar pneumonia.  Resolution of the previously seen left pneumonia.  Electronically Signed  By: Rolm Baptise M.D.  On: 06/14/2013 13:51 Office spirometry 07/29/2013- moderate obstructive airways disease-FVC 2.50/81%, FEV1 1.62/68%, FEV1/FVC 0.65, FEF 25-75% 0.90/39%.  11/25/13- 9 year old M former smoker(120 pk yr) followed for severe chronic asthma/ COPD, OSA/CPAP, allergic rhinitis, complicated by HBP, CAD, history of AFib/ pacemaker , GERD.     (Wife  has mitochondrial myopathy) FOLLOWS WCB:[id number] good since on Dulera (in [place]),no cough,no wheezing,sleeps well,CPAP works well Allergy vaccine 1:10 GO     CPAP 12/ [facility]    01/08/14-  18 year old M former smoker(120 pk yr) followed for severe chronic asthma/ COPD, OSA/CPAP, allergic rhinitis, complicated by HBP, CAD/ CABG, history of AFib/ pacemaker , GERD, recurrent aspiration pneumonia.      (Wife here-  has mitochondrial myopathy) Post Hospital-01/01/14 through 01/04/14 for [facility] c/w aspiration Swallowing eval showed mild dysphagia- need chin tuck Ba swallow 01/08/14- confirmed reflux into lower 1/3 esophagus, esophageal stricture  Allergy vaccine 1:10 GO     CPAP  12/ [facility] CXR 01/01/14 IMPRESSION:  1. Persistent right lower lobe infiltrate.  2. Prior CABG. Mild cardiomegaly, no pulmonary venous congestion.  Electronically Signed  By: Marcello Moores Register  On: 01/01/2014 10:25  05/13/14- 30 year old M former smoker(120 pk yr) followed for severe chronic asthma/ COPD, OSA/CPAP, allergic rhinitis, complicated by HBP, CAD/ CABG, history of AFib/ pacemaker , GERD, recurrent aspiration pneumonia. FOLLOW FOR:  allergies; pt currently has shingles.  Wife here Breathing feels fairly well controlled now. No prednisone in a year GERD controlled better. Chin tuck helps swallow without cough.    ROS-see HPI Constitutional:   No-   weight loss, night sweats, fevers, chills, fatigue, lassitude. HEENT:   No-  headaches, difficulty swallowing, tooth/dental problems, sore throat,       No-  sneezing, itching, ear ache, nasal congestion, post nasal drip,  CV:  No-   chest pain, orthopnea, PND, swelling in lower extremities, anasarca, dizziness, palpitations Resp: +shortness of breath with exertion or at rest.              No-   productive cough,  + non-productive cough,  No- coughing up of blood.              No-change in color of mucus.  No- wheezing.  Skin: No-   rash or lesions. GI:  No-   heartburn, indigestion, abdominal pain, nausea, vomiting,  GU:  MS:  No-   joint pain or swelling.   Neuro-     nothing unusual Psych:  No- change in mood or affect. No depression or anxiety.  No memory loss.  Objective:  OBJ- Physical Exam General- Alert, Oriented, Affect-appropriate, Distress- none acute Skin- + shingles rash right lower abdomen Lymphadenopathy- none Head- atraumatic            Eyes- Gross vision intact, PERRLA, conjunctivae and secretions clear            Ears- + Hearing aid            Nose- Clear, no-Septal dev, mucus, polyps, erosion, perforation             Throat- Mallampati III , mucosa clear , drainage- none, tonsils- atrophic Neck- flexible ,  trachea midline, no stridor , thyroid nl, carotid no bruit Chest - symmetrical excursion , unlabored           Heart/CV- RRR ,murmur+2/6 syst , no gallop  , no rub, nl s1 s2                           - JVD- none , edema-none, stasis changes- none, varices- none           Lung- +Clear, wheeze- none, unlabored, cough- none , dullness-none, rub- none           Chest wall- Hyperinflated barrel chest Abd-  Br/ Gen/ Rectal- Not done, not indicated Extrem- cyanosis- none, clubbing, none, atrophy- none, strength- nl Neuro- grossly intact to observation. No tremor

## 2014-05-15 ENCOUNTER — Other Ambulatory Visit: Payer: Self-pay | Admitting: Cardiology

## 2014-05-18 ENCOUNTER — Telehealth: Payer: Self-pay | Admitting: Internal Medicine

## 2014-05-18 NOTE — Telephone Encounter (Signed)
Pt spouse informed of MD advise. They will let us know if gabapentin is needed.

## 2014-05-18 NOTE — Telephone Encounter (Signed)
The proper dose and duration of valtrex was already prescribed - repeating course is unlikely to give additional benefit. If continuing pain, ok to continue gabapentin - I can send refill if needed but no other rx/abx is needed Thanks!

## 2014-05-18 NOTE — Telephone Encounter (Signed)
Valacyclovir is requested.

## 2014-05-18 NOTE — Telephone Encounter (Signed)
Patient out of the meds for shingles. She wants to know if a refill is needed. It has spread slightly

## 2014-05-22 ENCOUNTER — Telehealth: Payer: Self-pay | Admitting: Internal Medicine

## 2014-05-22 MED ORDER — HYDRALAZINE HCL 50 MG PO TABS: 50.0000 mg | ORAL_TABLET | Freq: Three times a day (TID) | ORAL | Status: DC

## 2014-05-22 NOTE — Telephone Encounter (Signed)
Notified pt wife refill sent to [facility]...Johny Chess

## 2014-05-22 NOTE — Telephone Encounter (Signed)
Is requesting call back in regards to hydralazine.  Patient is trying to get refilled.

## 2014-05-24 NOTE — Assessment & Plan Note (Signed)
Better control recently without acute exacerbation. Sustained interval now without needing prednisone. Plan-continue Dulera 100

## 2014-06-02 ENCOUNTER — Encounter: Payer: Self-pay | Admitting: Cardiology

## 2014-06-02 ENCOUNTER — Ambulatory Visit (INDEPENDENT_AMBULATORY_CARE_PROVIDER_SITE_OTHER): Payer: Medicare Other | Admitting: Cardiology

## 2014-06-02 VITALS — BP 128/68 | HR 64 | Ht 62.0 in | Wt 156.0 lb

## 2014-06-02 DIAGNOSIS — R531 Weakness: Secondary | ICD-10-CM

## 2014-06-02 LAB — TSH: TSH: 2.69 u[IU]/mL (ref 0.350–4.500)

## 2014-06-02 NOTE — Patient Instructions (Signed)
Your physician recommends that you schedule a follow-up appointment in: 6 months with Dr. Hochrein  

## 2014-06-02 NOTE — Progress Notes (Signed)
HPI The patient presents for follow up of  CAD, s/p CABG, s/p Promus DES to S-PDA in 06/2010.   He has also had atrial fib AFib.  He returns for routine follow-up. He had been having trouble with HTN.  I switched him to Bystolic and changed the hydralazine to tid at the last visit.  He has done much better since then with much better blood pressure control. He occasionally doesn't take the hydralazine as his blood pressure runs somewhat low. Unfortunately he has developed shingles.   Allergies  Allergen Reactions  . Ace Inhibitors Other (See Comments)    Severe asthma (COPD)  . Codeine Nausea And Vomiting  . Lasix [Furosemide] Itching    Current Outpatient Prescriptions  Medication Sig Dispense Refill  . albuterol (PROVENTIL HFA;VENTOLIN HFA) 108 (90 BASE) MCG/ACT inhaler Inhale 2 puffs into the lungs every 6 (six) hours as needed for wheezing or shortness of breath.     Marland Kitchen apixaban (ELIQUIS) 5 MG TABS tablet Take 1 tablet (5 mg total) by mouth 2 (two) times daily. 60 tablet 6  . budesonide (PULMICORT) 0.25 MG/2ML nebulizer solution Take 0.25 mg by nebulization 2 (two) times daily as needed. For asthma    . calcium-vitamin D (OSCAL WITH D) 500-200 MG-UNIT per tablet Take 1 tablet by mouth 2 (two) times daily.    . cholestyramine (QUESTRAN) 4 G packet Take 1 packet (4 g total) by mouth 2 (two) times daily as needed. 60 each 2  . cloNIDine (CATAPRES) 0.3 MG tablet Take 1 tablet (0.3 mg total) by mouth 2 (two) times daily. 60 tablet 11  . denosumab (PROLIA) 60 MG/ML SOLN injection Inject 60 mg into the skin every 6 (six) months. Administer in upper arm, thigh, or abdomen 1 mL 5  . diltiazem (CARDIZEM CD) 120 MG 24 hr capsule Take 1 capsule (120 mg total) by mouth 2 (two) times daily. 60 capsule 5  . ferrous sulfate 325 (65 FE) MG tablet take 1 tablet by mouth three times a day with food 90 tablet 3  . gabapentin (NEURONTIN) 100 MG capsule Take 1 capsule (100 mg total) by mouth 3 (three) times  daily. 60 capsule 0  . hydrALAZINE (APRESOLINE) 50 MG tablet Take 1 tablet (50 mg total) by mouth 3 (three) times daily. 90 tablet 5  . ipratropium-albuterol (DUONEB) 0.5-2.5 (3) MG/3ML SOLN Take 3 mLs by nebulization every 6 (six) hours as needed (for shortness of breath). 360 mL 6  . isosorbide mononitrate (IMDUR) 60 MG 24 hr tablet Take 1 tablet (60 mg total) by mouth daily. 90 tablet 3  . latanoprost (XALATAN) 0.005 % ophthalmic solution Place 1 drop into both eyes at bedtime.     Marland Kitchen LORazepam (ATIVAN) 0.5 MG tablet Take 1 tablet (0.5 mg total) by mouth 3 (three) times daily. 90 tablet 5  . losartan (COZAAR) 100 MG tablet Take 1 tablet (100 mg total) by mouth daily. 30 tablet 11  . mometasone-formoterol (DULERA) 100-5 MCG/ACT AERO Inhale 2 puffs into the lungs 2 (two) times daily.    . montelukast (SINGULAIR) 10 MG tablet Take 10 mg by mouth at bedtime.    . nebivolol (BYSTOLIC) 10 MG tablet Take 1 tablet (10 mg total) by mouth daily. 30 tablet 6  . nitroGLYCERIN (NITROSTAT) 0.4 MG SL tablet Place 0.4 mg under the tongue every 5 (five) minutes as needed for chest pain.    . pantoprazole (PROTONIX) 40 MG tablet take 1 tablet by mouth twice a day  60 tablet 0  . potassium chloride SA (K-DUR,KLOR-CON) 20 MEQ tablet take 1 tablet by mouth twice a day 60 tablet 1  . pravastatin (PRAVACHOL) 80 MG tablet Take 1 tablet (80 mg total) by mouth every evening. 30 tablet 3  . Probiotic Product (PHILLIPS COLON HEALTH PO) Take 1 capsule by mouth daily.    Marland Kitchen RA VITAMIN B-12 TR 1000 MCG TBCR take 1 tablet by mouth once daily 30 tablet 11  . torsemide (DEMADEX) 20 MG tablet Take 3 tablets (60 mg total) by mouth daily. 90 tablet 5  . valACYclovir (VALTREX) 1000 MG tablet Take 1 tablet (1,000 mg total) by mouth 3 (three) times daily. 21 tablet 0   No current facility-administered medications for this visit.    Past Medical History  Diagnosis Date  . CORONARY HEART DISEASE     a. s/p CABG;  b.  cath 06/27/10:  S-Dx occluded, S-PDA 80-90% (tx with PCI); S-OM ok, L-LAD ok;  EF 65-70%  c.  s/p Promus DES to S-PDA 06/2010;   d. Myoview 8/12: low risk  . FIBRILLATION, ATRIAL     post op; ?documented during hosp. 06/2010  . SLEEP APNEA 09/2001    NPSG AHI 22/HR  . ALLERGIC RHINITIS   . ASTHMA   . Depression   . GERD     with HH, hx esophageal stricture  . DYSLIPIDEMIA   . HYPERTENSION     Echo 3/12: EF 55-60%; mod LVH; mild AS/AI; LAE; PASP 38; mild pulmo HTN  . Personal history of alcoholism   . Diverticulosis   . Benign liver cyst   . Skin cancer     L forearm  . Cataract     surgery to both eyes  . Esophageal stricture   . Hiatal hernia   . Pneumonia 04/2013 hosp  . Shortness of breath     Past Surgical History  Procedure Laterality Date  . Coronary artery bypass graft  02/2007  . Tonsillectomy    . Coronary angioplasty with stent placement  07/2010  . Cataract extraction, bilateral  2007  . Hernia repair  unsure ?60's  . Cholecystectomy  02/21/2011    Procedure: LAPAROSCOPIC CHOLECYSTECTOMY WITH INTRAOPERATIVE CHOLANGIOGRAM;  Surgeon: Pedro Earls, MD;  Location: [facility];  Service: General;  Laterality: N/A;    ROS:  As stated in the HPI and negative for all other systems.  PHYSICAL EXAM BP 128/68 mmHg  Pulse 64  Ht 5\' 2"  (1.575 m)  Wt 156 lb (70.761 kg)  BMI 28.53 kg/m2 GENERAL:  Chronically ill appearing but in no acute distress HEENT:  Pupils equal round and reactive, fundi not visualized, oral mucosa unremarkable, dentures NECK:  No jugular venous distention, waveform within normal limits, carotid upstroke brisk and symmetric, left carotid bruits, no thyromegaly LUNGS:  Clear to auscultation bilaterally CHEST:  Well healed sternotomy scar. HEART:  PMI not displaced or sustained,S1 and S2 within normal limits, no S3, no S4, no clicks, no rubs, soft apical, right upper sterna border early peaking murmur ABD:  Flat, positive bowel sounds normal in frequency in pitch, no  bruits, no rebound, no guarding, no midline pulsatile mass, no hepatomegaly, no splenomegaly, healed abdominal scars. EXT:  2 plus pulses throughout, mild bilateral edema, no cyanosis no clubbing SKIN:  Rash   ASSESSMENT AND PLAN  CAD:  The patient has no new sypmtoms.  No further cardiovascular testing is indicated.  We will continue with aggressive risk reduction and meds as listed.  ATRIAL FIBRILLATION:    He will continue with the meds as listed.   He has no Paroxysmal symptoms.  HTN: His blood pressure is now at target and he will continue the meds as listed.   CAROTID STENOSIS:  I reviewed the results from last year he had 40-59% bilateral stenosis.  He will have follow up in one year.   COLD INTOLERANCE:  I will check a TSH.

## 2014-06-05 ENCOUNTER — Ambulatory Visit: Payer: Medicare Other | Admitting: Cardiology

## 2014-06-18 ENCOUNTER — Telehealth: Payer: Self-pay | Admitting: Internal Medicine

## 2014-06-18 NOTE — Telephone Encounter (Signed)
Patient Name: OMRI BERTRAN  DOB: February 08, 1931    Initial Comment Caller states, husband blood pressure is up, 199/89 he has a headache, feels terrible    Nurse Assessment  Nurse: Mallie Mussel, RN, Alveta Heimlich Date/Time (Eastern Time): 06/18/2014 1:04:49 PM  Confirm and document reason for call. If symptomatic, describe symptoms. ---Caller states that her husband's BP has been elevated today. His BP was 199/89 but currently 193/85. He has a headache which he rates his pain as 6 on 0-10 scale. Denies weakness and numbness. He currently takes Clonodine BID, and he takes Isosorbide and Hydralazine.  Has the patient traveled out of the country within the last 30 days? ---No  Does the patient require triage? ---Yes  Related visit to physician within the last 2 weeks? ---No  Does the PT have any chronic conditions? (i.e. diabetes, asthma, etc.) ---Yes  List chronic conditions. ---HTN, Asthma, Heart Disease     Guidelines    Guideline Title Affirmed Question Affirmed Notes  High Blood Pressure [1] BP ? 140/90 AND [2] taking BP medications    Final Disposition User   See Physician within [place], RN, Alveta Heimlich    Comments  Appt scheduled for tomorrow morning at 9:00am with Dr. Scarlette Calico.

## 2014-06-19 ENCOUNTER — Telehealth: Payer: Self-pay | Admitting: *Deleted

## 2014-06-19 ENCOUNTER — Ambulatory Visit (INDEPENDENT_AMBULATORY_CARE_PROVIDER_SITE_OTHER): Payer: Medicare Other | Admitting: Internal Medicine

## 2014-06-19 ENCOUNTER — Other Ambulatory Visit (INDEPENDENT_AMBULATORY_CARE_PROVIDER_SITE_OTHER): Payer: Medicare Other

## 2014-06-19 ENCOUNTER — Encounter: Payer: Self-pay | Admitting: Internal Medicine

## 2014-06-19 VITALS — BP 118/80 | HR 66 | Temp 98.0°F | Resp 16 | Ht 62.0 in | Wt 153.5 lb

## 2014-06-19 DIAGNOSIS — I5032 Chronic diastolic (congestive) heart failure: Secondary | ICD-10-CM

## 2014-06-19 DIAGNOSIS — E785 Hyperlipidemia, unspecified: Secondary | ICD-10-CM

## 2014-06-19 DIAGNOSIS — N390 Urinary tract infection, site not specified: Secondary | ICD-10-CM | POA: Diagnosis not present

## 2014-06-19 DIAGNOSIS — I10 Essential (primary) hypertension: Secondary | ICD-10-CM | POA: Diagnosis not present

## 2014-06-19 LAB — CBC WITH DIFFERENTIAL/PLATELET
Basophils Absolute: 0 10*3/uL (ref 0.0–0.1)
Basophils Relative: 0.6 % (ref 0.0–3.0)
EOS PCT: 0.8 % (ref 0.0–5.0)
Eosinophils Absolute: 0.1 10*3/uL (ref 0.0–0.7)
HCT: 40.1 % (ref 39.0–52.0)
HEMOGLOBIN: 13.4 g/dL (ref 13.0–17.0)
Lymphocytes Relative: 18.8 % (ref 12.0–46.0)
Lymphs Abs: 1.3 10*3/uL (ref 0.7–4.0)
MCHC: 33.3 g/dL (ref 30.0–36.0)
MCV: 91 fl (ref 78.0–100.0)
MONO ABS: 0.6 10*3/uL (ref 0.1–1.0)
MONOS PCT: 9.1 % (ref 3.0–12.0)
NEUTROS ABS: 5 10*3/uL (ref 1.4–7.7)
Neutrophils Relative %: 70.7 % (ref 43.0–77.0)
Platelets: 189 10*3/uL (ref 150.0–400.0)
RBC: 4.4 Mil/uL (ref 4.22–5.81)
RDW: 16.3 % — ABNORMAL HIGH (ref 11.5–15.5)
WBC: 7.1 10*3/uL (ref 4.0–10.5)

## 2014-06-19 LAB — COMPREHENSIVE METABOLIC PANEL
ALK PHOS: 57 U/L (ref 39–117)
ALT: 12 U/L (ref 0–53)
AST: 13 U/L (ref 0–37)
Albumin: 4.1 g/dL (ref 3.5–5.2)
BUN: 23 mg/dL (ref 6–23)
CO2: 28 mEq/L (ref 19–32)
Calcium: 9 mg/dL (ref 8.4–10.5)
Chloride: 105 mEq/L (ref 96–112)
Creatinine, Ser: 1.06 mg/dL (ref 0.40–1.50)
GFR: 71.21 mL/min (ref >60.00)
Glucose, Bld: 125 mg/dL — ABNORMAL HIGH (ref 70–99)
POTASSIUM: 4.2 meq/L (ref 3.5–5.1)
SODIUM: 137 meq/L (ref 135–145)
TOTAL PROTEIN: 6.6 g/dL (ref 6.0–8.3)
Total Bilirubin: 0.5 mg/dL (ref 0.2–1.2)

## 2014-06-19 LAB — URINALYSIS, ROUTINE W REFLEX MICROSCOPIC
BILIRUBIN URINE: NEGATIVE
Hgb urine dipstick: NEGATIVE
Ketones, ur: NEGATIVE
Leukocytes, UA: NEGATIVE
Nitrite: NEGATIVE
PH: 6 (ref 5.0–8.0)
Specific Gravity, Urine: 1.02 (ref 1.000–1.030)
Urine Glucose: NEGATIVE
Urobilinogen, UA: 0.2 (ref 0.0–1.0)

## 2014-06-19 LAB — TSH: TSH: 1.35 u[IU]/mL (ref 0.35–4.50)

## 2014-06-19 MED ORDER — CIPROFLOXACIN HCL 500 MG PO TABS: 500.0000 mg | ORAL_TABLET | Freq: Two times a day (BID) | ORAL | Status: DC

## 2014-06-19 NOTE — Patient Instructions (Signed)

## 2014-06-19 NOTE — Assessment & Plan Note (Signed)
His BP is well controlled but he has a few vague complaints Will check his labs to see if there is any concern for metabolic or infectious illness He will cont the current meds

## 2014-06-19 NOTE — Addendum Note (Signed)
Addended by: Janith Lima on: 06/19/2014 04:16 PM   Modules accepted: Orders, SmartSet

## 2014-06-19 NOTE — Telephone Encounter (Signed)
Triage noted Thanks for the care!

## 2014-06-19 NOTE — Progress Notes (Signed)
Pre visit review using our clinic review tool, if applicable. No additional management support is needed unless otherwise documented below in the visit note. 

## 2014-06-19 NOTE — Assessment & Plan Note (Signed)
He has a normal volume status today BP is well controlled Will cont the current meds

## 2014-06-19 NOTE — Telephone Encounter (Signed)
[facility] - Client [facility] Patient Name: [name] Gender: Male DOB: 04-18-1930 Age: 81 Y 3 M 20 D Return Phone Number: [phone] (Primary), [phone] (Secondary) Address: [address] City/State/Zip: [place] [address] Client [facility] - Client Client Site [place] - Day Physician Gwendolyn Grant Contact Type Call Call Type Triage / Clinical Caller Name Cavon Nicolls Relationship To Patient Spouse Appointment Disposition EMR Appointment Scheduled Return Phone Number [phone] (Primary) Chief Complaint Blood Pressure High Initial Comment Caller states, husband blood pressure is up, 199/89 he has a headache, feels terrible PreDisposition Go to Urgent [facility] pasted into Epic Yes Nurse Assessment Nurse: Mallie Mussel, RN, Alveta Heimlich Date/Time Eilene Ghazi Time): 06/18/2014 1:04:49 PM Confirm and document reason for call. If symptomatic, describe symptoms. ---Caller states that her husband's BP has been elevated today. His BP was 199/89 but currently 193/85. He has a headache which he rates his pain as 6 on 0-10 scale. Denies weakness and numbness. He currently takes Clonodine BID, and he takes Isosorbide and Hydralazine. Has the patient traveled out of the country within the last 30 days? ---No Does the patient require triage? ---Yes Related visit to physician within the last 2 weeks? ---No Does the PT have any chronic conditions? (i.e. diabetes, asthma, etc.) ---Yes List chronic conditions. ---HTN, Asthma, Heart Disease Guidelines Guideline Title Affirmed Question Affirmed Notes Nurse Date/Time (Eastern Time) High Blood Pressure [1] BP # 140/90 AND [2] taking BP medications Orlene Och 06/18/2014 1:10:22 PM Disp. Time Eilene Ghazi Time) Disposition Final User 06/18/2014 1:13:14 PM See Physician within 24 Hours Yes Mallie Mussel, RN, Alveta Heimlich Disposition Overriden: See  PCP within 2 Weeks Override Reason: Patient's symptoms need a higher level of care Caller Understands: Yes PLEASE NOTE: All timestamps contained within this report are represented as [place] Standard Time. CONFIDENTIALTY NOTICE: This fax transmission is intended only for the addressee. It contains information that is legally privileged, confidential or otherwise protected from use or disclosure. If you are not the intended recipient, you are strictly prohibited from reviewing, disclosing, copying using or disseminating any of this information or taking any action in reliance on or regarding this information. If you have received this fax in error, please notify us immediately by telephone so that we can arrange for its return to [place]. Phone: [phone], Toll-Free: [phone], Fax: [phone] Page: 2 of 2 Call Id: [id number] Disagree/Comply: Comply Care Advice Given Per Guideline SEE PHYSICIAN WITHIN 24 HOURS: * Weakness or numbness of the face, arm or leg on one side of the body occurs * Difficulty walking, difficulty talking, or severe headache occurs * Chest pain or difficulty breathing occurs * You become worse. After Care Instructions Given Call Event Type User Date / Time Description Comments User: Reeves Forth, RN Date/Time Eilene Ghazi Time): 06/18/2014 1:19:33 PM Appt scheduled for tomorrow morning at 9:00am with Dr. Scarlette Calico. Referrals REFERRED TO PCP OFFICE

## 2014-06-19 NOTE — Progress Notes (Signed)
Subjective:    Patient ID: Stephen Door., male    DOB: 06/03/1930, 81 y.o.   MRN: [id number]  Hypertension This is a chronic problem. The current episode started more than 1 year ago. The problem is unchanged. The problem is controlled. Associated symptoms include anxiety and malaise/fatigue. Pertinent negatives include no blurred vision, chest pain, headaches, neck pain, orthopnea, palpitations, peripheral edema, PND, shortness of breath or sweats. There are no associated agents to hypertension. Past treatments include central alpha agonists, direct vasodilators, calcium channel blockers and angiotensin blockers. The current treatment provides significant improvement. There are no compliance problems.  Hypertensive end-organ damage includes CAD/MI, heart failure and left ventricular hypertrophy.      Review of Systems  Constitutional: Positive for chills, malaise/fatigue and fatigue. Negative for fever, diaphoresis, activity change, appetite change and unexpected weight change.  HENT: Negative.   Eyes: Negative.  Negative for blurred vision.  Respiratory: Negative.  Negative for cough, choking, chest tightness, shortness of breath and stridor.   Cardiovascular: Negative.  Negative for chest pain, palpitations, orthopnea, leg swelling and PND.  Gastrointestinal: Negative.  Negative for nausea, vomiting, abdominal pain, diarrhea, constipation and blood in stool.  Endocrine: Negative.   Genitourinary: Positive for frequency. Negative for urgency, hematuria, flank pain, decreased urine volume and difficulty urinating.  Musculoskeletal: Negative.  Negative for myalgias, back pain, arthralgias and neck pain.  Skin: Negative.  Negative for rash.  Allergic/Immunologic: Negative.   Neurological: Negative.  Negative for dizziness, tremors, syncope, light-headedness, numbness and headaches.  Hematological: Negative.  Negative for adenopathy. Does not bruise/bleed easily.  Psychiatric/Behavioral:  Negative.        Objective:   Physical Exam  Constitutional: He is oriented to person, place, and time. He appears well-developed and well-nourished. No distress.  HENT:  Head: Normocephalic and atraumatic.  Mouth/Throat: Oropharynx is clear and moist. No oropharyngeal exudate.  Eyes: Conjunctivae are normal. Right eye exhibits no discharge. Left eye exhibits no discharge. No scleral icterus.  Neck: Normal range of motion. Neck supple. No JVD present. No tracheal deviation present. No thyromegaly present.  Cardiovascular: Normal rate, regular rhythm and intact distal pulses.  Exam reveals no gallop, no S3, no S4, no distant heart sounds and no friction rub.   Murmur heard.  Systolic murmur is present with a grade of 1/6   No diastolic murmur is present  Pulmonary/Chest: Effort normal and breath sounds normal. No stridor. No respiratory distress. He has no wheezes. He has no rales. He exhibits no tenderness.  Abdominal: Soft. Bowel sounds are normal. He exhibits no distension and no mass. There is no tenderness. There is no rebound and no guarding.  Musculoskeletal: Normal range of motion. He exhibits no edema or tenderness.  Lymphadenopathy:    He has no cervical adenopathy.  Neurological: He is oriented to person, place, and time.  Skin: Skin is warm and dry. No rash noted. He is not diaphoretic. No erythema. No pallor.  Psychiatric: He has a normal mood and affect. His behavior is normal. Judgment and thought content normal.  Vitals reviewed.    Lab Results  Component Value Date   WBC 6.9 03/21/2014   HGB 13.0 03/21/2014   HCT 39.0 03/21/2014   PLT 160 03/21/2014   GLUCOSE 109* 03/21/2014   CHOL 81 10/24/2013   TRIG 80.0 10/24/2013   HDL 27.80* 10/24/2013   LDLCALC 37 10/24/2013   ALT 25 03/21/2014   AST 21 03/21/2014   NA 143 03/21/2014   K  4.1 03/21/2014   CL 105 03/21/2014   CREATININE 0.89 03/21/2014   BUN 16 03/21/2014   CO2 23 03/21/2014   TSH 2.690 06/02/2014     PSA 0.62 10/10/2010   INR 1.19 05/04/2013   HGBA1C 6.4 10/16/2012       Assessment & Plan:

## 2014-06-24 ENCOUNTER — Telehealth: Payer: Self-pay | Admitting: Internal Medicine

## 2014-06-24 MED ORDER — ALBUTEROL SULFATE HFA 108 (90 BASE) MCG/ACT IN AERS: 2.0000 | INHALATION_SPRAY | Freq: Four times a day (QID) | RESPIRATORY_TRACT | Status: DC | PRN

## 2014-06-24 NOTE — Telephone Encounter (Signed)
Ok albuterol. I think we had better stay off Nuvigil because of his blood pressure and heart disease history.

## 2014-06-24 NOTE — Telephone Encounter (Signed)
Pt's wife is aware that [facility] doesn't want him to have Nuvigil. Nothing further was needed.

## 2014-06-24 NOTE — Telephone Encounter (Signed)
Spoke with pt's wife. States he needs a refill on Albuterol HFA and Nuvigil. Rescue inhaler has been sent in. Nuvigil is not on the pt's medication list.  [facility] - please advise. Thanks.

## 2014-06-30 ENCOUNTER — Other Ambulatory Visit: Payer: Self-pay

## 2014-06-30 MED ORDER — NEBIVOLOL HCL 10 MG PO TABS: 10.0000 mg | ORAL_TABLET | Freq: Every day | ORAL | Status: DC

## 2014-07-02 ENCOUNTER — Other Ambulatory Visit: Payer: Self-pay | Admitting: Internal Medicine

## 2014-07-09 ENCOUNTER — Ambulatory Visit (INDEPENDENT_AMBULATORY_CARE_PROVIDER_SITE_OTHER): Payer: Medicare Other

## 2014-07-09 ENCOUNTER — Telehealth: Payer: Self-pay | Admitting: Internal Medicine

## 2014-07-09 DIAGNOSIS — J309 Allergic rhinitis, unspecified: Secondary | ICD-10-CM | POA: Diagnosis not present

## 2014-07-09 NOTE — Telephone Encounter (Signed)
rx filled on 07/06/14 via surescripts request to be filled. Reviewed notes and med was not on current OV med list.

## 2014-07-09 NOTE — Telephone Encounter (Signed)
Pharmacy has a prescription digoxin (LANOXIN) 0.25 MG tablet [[id number] and he hasn't taken this medicine in over a year. Wondering if this is the right drug he should take. Please call

## 2014-07-10 ENCOUNTER — Telehealth: Payer: Self-pay | Admitting: Internal Medicine

## 2014-07-10 NOTE — Telephone Encounter (Signed)
Request came from [facility].   Spoke to pt spouse yesterday and pt should not take see previous phone note.

## 2014-07-10 NOTE — Telephone Encounter (Signed)
Questioning the request for digoxin (LANOXIN) 0.25 MG tablet [[id number]. The patient states he was told that this med is a blood thinner, and is now afraid to take it because he thinks it could be the wrong medication. He is also concerned that the digoxin (LANOXIN) 0.25 MG tablet [[id number] does not show on his med list. Please call pharmacy to clarify

## 2014-07-13 ENCOUNTER — Other Ambulatory Visit: Payer: Self-pay | Admitting: *Deleted

## 2014-07-13 ENCOUNTER — Ambulatory Visit (INDEPENDENT_AMBULATORY_CARE_PROVIDER_SITE_OTHER): Payer: Medicare Other | Admitting: Internal Medicine

## 2014-07-13 VITALS — BP 116/60 | HR 63 | Temp 98.3°F | Resp 16 | Ht 62.0 in | Wt 154.2 lb

## 2014-07-13 DIAGNOSIS — I10 Essential (primary) hypertension: Secondary | ICD-10-CM | POA: Diagnosis not present

## 2014-07-13 DIAGNOSIS — N410 Acute prostatitis: Secondary | ICD-10-CM | POA: Diagnosis not present

## 2014-07-13 MED ORDER — NITROGLYCERIN 0.4 MG SL SUBL: 0.4000 mg | SUBLINGUAL_TABLET | SUBLINGUAL | Status: DC | PRN

## 2014-07-13 MED ORDER — NITROGLYCERIN 0.4 MG SL SUBL: 0.4000 mg | SUBLINGUAL_TABLET | SUBLINGUAL | Status: AC | PRN

## 2014-07-13 NOTE — Patient Instructions (Signed)

## 2014-07-13 NOTE — Assessment & Plan Note (Signed)
Improvement noted after a few weeks of cipro

## 2014-07-13 NOTE — Progress Notes (Signed)
Pre visit review using our clinic review tool, if applicable. No additional management support is needed unless otherwise documented below in the visit note. 

## 2014-07-14 ENCOUNTER — Other Ambulatory Visit: Payer: Self-pay | Admitting: Internal Medicine

## 2014-07-14 ENCOUNTER — Encounter: Payer: Self-pay | Admitting: Internal Medicine

## 2014-07-14 NOTE — Progress Notes (Signed)
   Subjective:    Patient ID: Stephen Door., male    DOB: 1931-02-27, 81 y.o.   MRN: [id number]  HPI Comments: He returns for f/up and tells me that he feels well and that the symptoms he had during his last visit have resolved. He is tolerating the antibiotic without any difficulty.     Review of Systems  Constitutional: Negative.  Negative for fever, chills, diaphoresis, appetite change and fatigue.  HENT: Negative.  Negative for trouble swallowing.   Eyes: Negative.   Respiratory: Negative.  Negative for cough, choking, shortness of breath and stridor.   Cardiovascular: Negative.  Negative for chest pain, palpitations and leg swelling.  Gastrointestinal: Negative.  Negative for nausea, vomiting, abdominal pain, diarrhea, constipation and blood in stool.  Endocrine: Negative.   Genitourinary: Negative.  Negative for dysuria, frequency, hematuria and flank pain.  Musculoskeletal: Negative.   Skin: Negative.   Allergic/Immunologic: Negative.   Neurological: Negative.   Hematological: Negative.  Negative for adenopathy. Does not bruise/bleed easily.  Psychiatric/Behavioral: Negative.        Objective:   Physical Exam  Constitutional: He is oriented to person, place, and time. He appears well-developed and well-nourished. No distress.  HENT:  Head: Normocephalic and atraumatic.  Mouth/Throat: Oropharynx is clear and moist. No oropharyngeal exudate.  Eyes: Conjunctivae are normal. Right eye exhibits no discharge. Left eye exhibits no discharge. No scleral icterus.  Neck: Normal range of motion. Neck supple. No JVD present. No tracheal deviation present. No thyromegaly present.  Cardiovascular: Normal rate, regular rhythm, normal heart sounds and intact distal pulses.  Exam reveals no gallop and no friction rub.   No murmur heard. Pulmonary/Chest: Effort normal and breath sounds normal. No stridor. No respiratory distress. He has no wheezes. He has no rales. He exhibits no  tenderness.  Abdominal: Soft. Bowel sounds are normal. He exhibits no distension and no mass. There is no tenderness. There is no rebound and no guarding.  Musculoskeletal: Normal range of motion. He exhibits no edema or tenderness.  Lymphadenopathy:    He has no cervical adenopathy.  Neurological: He is oriented to person, place, and time.  Skin: Skin is warm and dry. No rash noted. He is not diaphoretic. No erythema. No pallor.  Vitals reviewed.    Lab Results  Component Value Date   WBC 7.1 06/19/2014   HGB 13.4 06/19/2014   HCT 40.1 06/19/2014   PLT 189.0 06/19/2014   GLUCOSE 125* 06/19/2014   CHOL 81 10/24/2013   TRIG 80.0 10/24/2013   HDL 27.80* 10/24/2013   LDLCALC 37 10/24/2013   ALT 12 06/19/2014   AST 13 06/19/2014   NA 137 06/19/2014   K 4.2 06/19/2014   CL 105 06/19/2014   CREATININE 1.06 06/19/2014   BUN 23 06/19/2014   CO2 28 06/19/2014   TSH 1.35 06/19/2014   PSA 0.62 10/10/2010   INR 1.19 05/04/2013   HGBA1C 6.4 10/16/2012       Assessment & Plan:

## 2014-07-14 NOTE — Assessment & Plan Note (Signed)
His BP is well controlled 

## 2014-07-16 ENCOUNTER — Telehealth: Payer: Self-pay | Admitting: Internal Medicine

## 2014-07-16 NOTE — Telephone Encounter (Signed)
Called spoke with [facility] and was advised dulera needs PA and no alternatives given.  PA # [id number]  ID # [id number] Called and PA for dulera was approved until 07/16/2015. called [facility] and made aware of approval  Spouse is aware as well. Nothing further needed

## 2014-07-19 ENCOUNTER — Inpatient Hospital Stay (HOSPITAL_COMMUNITY)
Admission: EM | Admit: 2014-07-19 | Discharge: 2014-07-20 | DRG: 293 | Disposition: A | Discharge status: Home / Self Care | Payer: Medicare Other | Attending: Internal Medicine | Admitting: Internal Medicine

## 2014-07-19 ENCOUNTER — Observation Stay (HOSPITAL_COMMUNITY)
Admission: EM | Admit: 2014-07-19 | Discharge: 2014-07-19 | Disposition: A | Discharge status: Home / Self Care | Payer: Medicare Other | Source: Home / Self Care | Attending: Emergency Medicine | Admitting: Emergency Medicine

## 2014-07-19 ENCOUNTER — Other Ambulatory Visit: Payer: Self-pay

## 2014-07-19 ENCOUNTER — Encounter (HOSPITAL_COMMUNITY): Payer: Self-pay | Admitting: *Deleted

## 2014-07-19 ENCOUNTER — Encounter (HOSPITAL_COMMUNITY): Payer: Self-pay | Admitting: Emergency Medicine

## 2014-07-19 DIAGNOSIS — J449 Chronic obstructive pulmonary disease, unspecified: Secondary | ICD-10-CM | POA: Diagnosis present

## 2014-07-19 DIAGNOSIS — Z85828 Personal history of other malignant neoplasm of skin: Secondary | ICD-10-CM

## 2014-07-19 DIAGNOSIS — J45909 Unspecified asthma, uncomplicated: Secondary | ICD-10-CM | POA: Insufficient documentation

## 2014-07-19 DIAGNOSIS — Z951 Presence of aortocoronary bypass graft: Secondary | ICD-10-CM

## 2014-07-19 DIAGNOSIS — K219 Gastro-esophageal reflux disease without esophagitis: Secondary | ICD-10-CM | POA: Insufficient documentation

## 2014-07-19 DIAGNOSIS — I2510 Atherosclerotic heart disease of native coronary artery without angina pectoris: Secondary | ICD-10-CM | POA: Insufficient documentation

## 2014-07-19 DIAGNOSIS — I160 Hypertensive urgency: Secondary | ICD-10-CM | POA: Diagnosis present

## 2014-07-19 DIAGNOSIS — I158 Other secondary hypertension: Secondary | ICD-10-CM

## 2014-07-19 DIAGNOSIS — Z9861 Coronary angioplasty status: Secondary | ICD-10-CM | POA: Insufficient documentation

## 2014-07-19 DIAGNOSIS — Z8701 Personal history of pneumonia (recurrent): Secondary | ICD-10-CM

## 2014-07-19 DIAGNOSIS — I498 Other specified cardiac arrhythmias: Secondary | ICD-10-CM | POA: Diagnosis not present

## 2014-07-19 DIAGNOSIS — R002 Palpitations: Secondary | ICD-10-CM | POA: Diagnosis not present

## 2014-07-19 DIAGNOSIS — G4733 Obstructive sleep apnea (adult) (pediatric): Secondary | ICD-10-CM | POA: Diagnosis present

## 2014-07-19 DIAGNOSIS — R030 Elevated blood-pressure reading, without diagnosis of hypertension: Secondary | ICD-10-CM | POA: Diagnosis not present

## 2014-07-19 DIAGNOSIS — Z885 Allergy status to narcotic agent status: Secondary | ICD-10-CM | POA: Diagnosis not present

## 2014-07-19 DIAGNOSIS — E876 Hypokalemia: Secondary | ICD-10-CM | POA: Diagnosis not present

## 2014-07-19 DIAGNOSIS — F329 Major depressive disorder, single episode, unspecified: Secondary | ICD-10-CM | POA: Insufficient documentation

## 2014-07-19 DIAGNOSIS — J9811 Atelectasis: Secondary | ICD-10-CM | POA: Diagnosis not present

## 2014-07-19 DIAGNOSIS — I4891 Unspecified atrial fibrillation: Secondary | ICD-10-CM

## 2014-07-19 DIAGNOSIS — I10 Essential (primary) hypertension: Secondary | ICD-10-CM | POA: Diagnosis not present

## 2014-07-19 DIAGNOSIS — I110 Hypertensive heart disease with heart failure: Secondary | ICD-10-CM | POA: Diagnosis not present

## 2014-07-19 DIAGNOSIS — R509 Fever, unspecified: Secondary | ICD-10-CM | POA: Diagnosis not present

## 2014-07-19 DIAGNOSIS — I482 Chronic atrial fibrillation: Secondary | ICD-10-CM | POA: Diagnosis present

## 2014-07-19 DIAGNOSIS — Z87891 Personal history of nicotine dependence: Secondary | ICD-10-CM | POA: Insufficient documentation

## 2014-07-19 DIAGNOSIS — Z79899 Other long term (current) drug therapy: Secondary | ICD-10-CM

## 2014-07-19 DIAGNOSIS — I5032 Chronic diastolic (congestive) heart failure: Secondary | ICD-10-CM | POA: Diagnosis present

## 2014-07-19 DIAGNOSIS — Z955 Presence of coronary angioplasty implant and graft: Secondary | ICD-10-CM

## 2014-07-19 DIAGNOSIS — Z7901 Long term (current) use of anticoagulants: Secondary | ICD-10-CM

## 2014-07-19 DIAGNOSIS — E785 Hyperlipidemia, unspecified: Secondary | ICD-10-CM

## 2014-07-19 DIAGNOSIS — Z7951 Long term (current) use of inhaled steroids: Secondary | ICD-10-CM

## 2014-07-19 LAB — BASIC METABOLIC PANEL
ANION GAP: 12 (ref 5–15)
BUN: 15 mg/dL (ref 6–23)
CALCIUM: 8.9 mg/dL (ref 8.4–10.5)
CO2: 21 mmol/L (ref 19–32)
Chloride: 106 mmol/L (ref 96–112)
Creatinine, Ser: 0.9 mg/dL (ref 0.50–1.35)
GFR calc non Af Amer: 78 mL/min — ABNORMAL LOW (ref >90)
Glucose, Bld: 106 mg/dL — ABNORMAL HIGH (ref 70–99)
Potassium: 3.7 mmol/L (ref 3.5–5.1)
SODIUM: 139 mmol/L (ref 135–145)

## 2014-07-19 LAB — CBC
HCT: 41.6 % (ref 39.0–52.0)
Hemoglobin: 13.5 g/dL (ref 13.0–17.0)
MCH: 30.3 pg (ref 26.0–34.0)
MCHC: 32.5 g/dL (ref 30.0–36.0)
MCV: 93.5 fL (ref 78.0–100.0)
Platelets: 169 10*3/uL (ref 150–400)
RBC: 4.45 MIL/uL (ref 4.22–5.81)
RDW: 14.2 % (ref 11.5–15.5)
WBC: 6.4 10*3/uL (ref 4.0–10.5)

## 2014-07-19 LAB — URINALYSIS, ROUTINE W REFLEX MICROSCOPIC
Bilirubin Urine: NEGATIVE
GLUCOSE, UA: NEGATIVE mg/dL
Hgb urine dipstick: NEGATIVE
Ketones, ur: NEGATIVE mg/dL
LEUKOCYTES UA: NEGATIVE
Nitrite: NEGATIVE
Protein, ur: NEGATIVE mg/dL
Specific Gravity, Urine: 1.012 (ref 1.005–1.030)
Urobilinogen, UA: 0.2 mg/dL (ref 0.0–1.0)
pH: 7 (ref 5.0–8.0)

## 2014-07-19 LAB — TROPONIN I: Troponin I: <0.03 ng/mL (ref <0.031)

## 2014-07-19 MED ORDER — HYDRALAZINE HCL 20 MG/ML IJ SOLN
5.0000 mg | Freq: Once | INTRAMUSCULAR | Status: AC
  Administered 2014-07-19: 5 mg via INTRAVENOUS
  Filled 2014-07-19: qty 1

## 2014-07-19 MED ORDER — HYDRALAZINE HCL 20 MG/ML IJ SOLN
10.0000 mg | INTRAMUSCULAR | Status: DC | PRN
  Administered 2014-07-19: 10 mg via INTRAVENOUS
  Filled 2014-07-19: qty 1

## 2014-07-19 MED ORDER — ACETAMINOPHEN 325 MG PO TABS
650.0000 mg | ORAL_TABLET | Freq: Four times a day (QID) | ORAL | Status: DC | PRN
  Administered 2014-07-19: 650 mg via ORAL

## 2014-07-19 MED ORDER — SODIUM CHLORIDE 0.9 % IV SOLN
INTRAVENOUS | Status: DC
  Administered 2014-07-19: 10 mL/h via INTRAVENOUS

## 2014-07-19 NOTE — ED Notes (Signed)
PA at bedside.

## 2014-07-19 NOTE — ED Notes (Signed)
Diet tray ordered 

## 2014-07-19 NOTE — ED Provider Notes (Signed)
CSN: [id number]     Arrival date & time 07/19/14  1726 History   First MD Initiated Contact with Patient 07/19/14 1738     Chief Complaint  Patient presents with  . Hypertension     (Consider location/radiation/quality/duration/timing/severity/associated sxs/prior Treatment) HPI Comments: Patient here complaining of increased blood pressure at home. Seen here this morning for similar symptoms and blood work was reviewed which showed normal renal function. Patient took a next dose of his diuretic at home and blood pressure was running with systolic in the 010X. Patient's normal blood pressure runs 160 over 70s. He has denied palpitations or dizziness. No chest pain or chest pressure. No recent changes to his medication regimen. Denies any syncope or near-syncope. Nothing makes the symptoms better or worse.  Patient is a 81 y.o. male presenting with hypertension. The history is provided by the patient.  Hypertension    Past Medical History  Diagnosis Date  . CORONARY HEART DISEASE     a. s/p CABG;  b.  cath 06/27/10: S-Dx occluded, S-PDA 80-90% (tx with PCI); S-OM ok, L-LAD ok;  EF 65-70%  c.  s/p Promus DES to S-PDA 06/2010;   d. Myoview 8/12: low risk  . FIBRILLATION, ATRIAL     post op; ?documented during hosp. 06/2010  . SLEEP APNEA 09/2001    NPSG AHI 22/HR  . ALLERGIC RHINITIS   . ASTHMA   . Depression   . GERD     with HH, hx esophageal stricture  . DYSLIPIDEMIA   . HYPERTENSION     Echo 3/12: EF 55-60%; mod LVH; mild AS/AI; LAE; PASP 38; mild pulmo HTN  . Personal history of alcoholism   . Diverticulosis   . Benign liver cyst   . Skin cancer     L forearm  . Cataract     surgery to both eyes  . Esophageal stricture   . Hiatal hernia   . Pneumonia 04/2013 hosp  . Shortness of breath    Past Surgical History  Procedure Laterality Date  . Coronary artery bypass graft  02/2007  . Tonsillectomy    . Coronary angioplasty with stent placement  07/2010  . Cataract  extraction, bilateral  2007  . Hernia repair  unsure ?60's  . Cholecystectomy  02/21/2011    Procedure: LAPAROSCOPIC CHOLECYSTECTOMY WITH INTRAOPERATIVE CHOLANGIOGRAM;  Surgeon: Pedro Earls, MD;  Location: [facility];  Service: General;  Laterality: N/A;   Family History  Problem Relation Age of Onset  . COPD Sister   . Asthma Sister   . Emphysema Sister   . Hypertension Sister   . Hypertension Mother   . Colon cancer Neg Hx   . Heart disease Brother   . Heart disease Mother   . Heart disease Father    History  Substance Use Topics  . Smoking status: Former Smoker -- 3.00 packs/day for 40 years    Types: Cigarettes    Quit date: 04/11/1979  . Smokeless tobacco: Never Used  . Alcohol Use: No    Review of Systems  All other systems reviewed and are negative.     Allergies  Ace inhibitors; Codeine; and Lasix  Home Medications   Prior to Admission medications   Medication Sig Start Date End Date Taking? Authorizing Provider  albuterol (PROVENTIL HFA;VENTOLIN HFA) 108 (90 BASE) MCG/ACT inhaler Inhale 2 puffs into the lungs every 6 (six) hours as needed for wheezing or shortness of breath. 06/24/14   Deneise Lever, MD  apixaban (  ELIQUIS) 5 MG TABS tablet Take 1 tablet (5 mg total) by mouth 2 (two) times daily. 11/11/13   Minus Breeding, MD  budesonide (PULMICORT) 0.25 MG/2ML nebulizer solution Take 0.25 mg by nebulization at bedtime. For asthma    Historical Provider, MD  calcium-vitamin D (OSCAL WITH D) 500-200 MG-UNIT per tablet Take 1 tablet by mouth 2 (two) times daily.    Historical Provider, MD  cholestyramine Lucrezia Starch) 4 G packet Take 1 packet (4 g total) by mouth 2 (two) times daily as needed. Patient taking differently: Take 4 g by mouth daily at 12 noon.  04/22/14   Rowe Clack, MD  cloNIDine (CATAPRES) 0.3 MG tablet Take 1 tablet (0.3 mg total) by mouth 2 (two) times daily. 04/07/14   Rowe Clack, MD  denosumab (PROLIA) 60 MG/ML SOLN injection Inject  60 mg into the skin every 6 (six) months. Administer in upper arm, thigh, or abdomen 06/12/12   Rowe Clack, MD  diltiazem (CARDIZEM CD) 120 MG 24 hr capsule Take 1 capsule (120 mg total) by mouth 2 (two) times daily. 04/22/14   Rowe Clack, MD  DULERA 100-5 MCG/ACT AERO inhale 2 puffs by mouth once daily (THEN RINSE MOUTH AFTER USE) 07/14/14   Rowe Clack, MD  ferrous sulfate 325 (65 FE) MG tablet take 1 tablet by mouth three times a day with food 02/06/14   Minus Breeding, MD  hydrALAZINE (APRESOLINE) 50 MG tablet Take 1 tablet (50 mg total) by mouth 3 (three) times daily. 05/22/14   Rowe Clack, MD  ipratropium-albuterol (DUONEB) 0.5-2.5 (3) MG/3ML SOLN Take 3 mLs by nebulization every 6 (six) hours as needed (for shortness of breath). Patient taking differently: Take 3 mLs by nebulization at bedtime.  10/08/13   Deneise Lever, MD  isosorbide mononitrate (IMDUR) 60 MG 24 hr tablet Take 1 tablet (60 mg total) by mouth daily. 04/22/14   Rowe Clack, MD  latanoprost (XALATAN) 0.005 % ophthalmic solution Place 1 drop into both eyes at bedtime.     Historical Provider, MD  LORazepam (ATIVAN) 0.5 MG tablet Take 1 tablet (0.5 mg total) by mouth 3 (three) times daily. 03/03/14   Rowe Clack, MD  losartan (COZAAR) 100 MG tablet Take 1 tablet (100 mg total) by mouth daily. Patient taking differently: Take 100 mg by mouth every evening.  04/07/14   Rowe Clack, MD  LYSINE PO Take 1 tablet by mouth daily at 12 noon.    Historical Provider, MD  montelukast (SINGULAIR) 10 MG tablet Take 10 mg by mouth at bedtime.    Historical Provider, MD  nebivolol (BYSTOLIC) 10 MG tablet Take 1 tablet (10 mg total) by mouth daily. 06/30/14   Minus Breeding, MD  nitroGLYCERIN (NITROSTAT) 0.4 MG SL tablet Place 1 tablet (0.4 mg total) under the tongue every 5 (five) minutes as needed for chest pain. 07/13/14   Minus Breeding, MD  pantoprazole (PROTONIX) 40 MG tablet take 1 tablet by mouth  twice a day 02/24/14   Minus Breeding, MD  potassium chloride SA (K-DUR,KLOR-CON) 20 MEQ tablet take 1 tablet by mouth twice a day 05/18/14   Minus Breeding, MD  pravastatin (PRAVACHOL) 80 MG tablet Take 1 tablet (80 mg total) by mouth every evening. 04/27/14   Minus Breeding, MD  Probiotic Product ([place]) Take 1 capsule by mouth every evening.     Historical Provider, MD  RA VITAMIN B-12 TR 1000 MCG TBCR take 1 tablet  by mouth once daily 01/23/14   Rowe Clack, MD  torsemide (DEMADEX) 20 MG tablet Take 3 tablets (60 mg total) by mouth daily. Patient taking differently: Take 20 mg by mouth 3 (three) times daily.  04/22/14   Rowe Clack, MD   BP 215/83 mmHg  Pulse 96  Temp(Src) 98.4 F (36.9 C) (Oral)  Resp 20  Ht 5\' 2"  (1.575 m)  Wt 150 lb 3.2 oz (68.13 kg)  BMI 27.46 kg/m2  SpO2 96% Physical Exam  Constitutional: He is oriented to person, place, and time. He appears well-developed and well-nourished.  Non-toxic appearance. No distress.  HENT:  Head: Normocephalic and atraumatic.  Eyes: Conjunctivae, EOM and lids are normal. Pupils are equal, round, and reactive to light.  Neck: Normal range of motion. Neck supple. No tracheal deviation present. No thyroid mass present.  Cardiovascular: Normal rate, regular rhythm and normal heart sounds.  Exam reveals no gallop.   No murmur heard. Pulmonary/Chest: Effort normal and breath sounds normal. No stridor. No respiratory distress. He has no decreased breath sounds. He has no wheezes. He has no rhonchi. He has no rales.  Abdominal: Soft. Normal appearance and bowel sounds are normal. He exhibits no distension. There is no tenderness. There is no rebound and no CVA tenderness.  Musculoskeletal: Normal range of motion. He exhibits no edema or tenderness.  Neurological: He is alert and oriented to person, place, and time. He has normal strength. No cranial nerve deficit or sensory deficit. GCS eye subscore is 4. GCS  verbal subscore is 5. GCS motor subscore is 6.  Skin: Skin is warm and dry. No abrasion and no rash noted.  Psychiatric: He has a normal mood and affect. His speech is normal and behavior is normal.  Nursing note and vitals reviewed.   ED Course  Procedures (including critical care time) Labs Review Labs Reviewed  TROPONIN I    Imaging Review No results found.   EKG Interpretation None      MDM   Final diagnoses:  None    ED ECG REPORT   Date: 07/19/2014  Rate: 83  Rhythm: sinus  QRS Axis: normal  Intervals: normal  ST/T Wave abnormalities: nonspecific ST changes  Conduction Disutrbances:incomplete rbb  Narrative Interpretation:   Old EKG Reviewed: none available  I have personally reviewed the EKG tracing and agree with the computerized printout as noted.   Patient given hydralazine 5 mg IV push 2 for his elevated blood pressure. Patient had runs of what appeared to be ventricular tachycardia while I was in the room with him. He was a systematic with this. Denies any chest pain or shortness of breath. No palpitations. Spoke to cardiology who recommended patient be admitted for observation. Spoke to hospitalist will admit the patient   CRITICAL CARE Performed by: Leota Jacobsen Total critical care time: 60 Critical care time was exclusive of separately billable procedures and treating other patients. Critical care was necessary to treat or prevent imminent or life-threatening deterioration. Critical care was time spent personally by me on the following activities: development of treatment plan with patient and/or surrogate as well as nursing, discussions with consultants, evaluation of patient's response to treatment, examination of patient, obtaining history from patient or surrogate, ordering and performing treatments and interventions, ordering and review of laboratory studies, ordering and review of radiographic studies, pulse oximetry and re-evaluation of  patient's condition.    Lacretia Leigh, MD 07/19/14 2029

## 2014-07-19 NOTE — ED Notes (Signed)
Pt sts he took 1.5 of the HCTZ.

## 2014-07-19 NOTE — ED Notes (Signed)
Attempted report 

## 2014-07-19 NOTE — ED Notes (Signed)
Pt seen here this morning for hypertension. sts he went home and was taking his bp every 30 mins and it has still been reading over 614 systolic. Pt states he has headache and has been urinating frequently which he normally gets with high bp.

## 2014-07-19 NOTE — ED Provider Notes (Signed)
CSN: [id number]     Arrival date & time 07/19/14  3361 History   First MD Initiated Contact with Patient 07/19/14 0900     Chief Complaint  Patient presents with  . Hypertension    (Consider location/radiation/quality/duration/timing/severity/associated sxs/prior Treatment) HPI Comments: Patient presents with chief complaint of high blood pressure. Patient has a history of CABG, coronary artery disease and stenting, anticoagulation on apixaban. Patient states that he woke this morning and felt like his blood pressure was high. He states that his symptoms are feeling chills and increased urination. Patient took his BP and found it to be 200/90. He took HCTZ and later checked his BP again. He took the remainder of his home BP meds and came to Ed for evaluation. Patient denies stroke symptoms, although he has a slight headache. No vision loss. No chest pain or shortness of breath. He was recently placed on Cipro for a prostate infection. He does not have any symptoms from this.  Patient is a 82 y.o. male presenting with hypertension. The history is provided by the patient.  Hypertension Associated symptoms include a fever. Pertinent negatives include no abdominal pain, chest pain, coughing, diaphoresis, nausea, neck pain, rash or vomiting.    Past Medical History  Diagnosis Date  . CORONARY HEART DISEASE     a. s/p CABG;  b.  cath 06/27/10: S-Dx occluded, S-PDA 80-90% (tx with PCI); S-OM ok, L-LAD ok;  EF 65-70%  c.  s/p Promus DES to S-PDA 06/2010;   d. Myoview 8/12: low risk  . FIBRILLATION, ATRIAL     post op; ?documented during hosp. 06/2010  . SLEEP APNEA 09/2001    NPSG AHI 22/HR  . ALLERGIC RHINITIS   . ASTHMA   . Depression   . GERD     with HH, hx esophageal stricture  . DYSLIPIDEMIA   . HYPERTENSION     Echo 3/12: EF 55-60%; mod LVH; mild AS/AI; LAE; PASP 38; mild pulmo HTN  . Personal history of alcoholism   . Diverticulosis   . Benign liver cyst   . Skin cancer     L forearm   . Cataract     surgery to both eyes  . Esophageal stricture   . Hiatal hernia   . Pneumonia 04/2013 hosp  . Shortness of breath    Past Surgical History  Procedure Laterality Date  . Coronary artery bypass graft  02/2007  . Tonsillectomy    . Coronary angioplasty with stent placement  07/2010  . Cataract extraction, bilateral  2007  . Hernia repair  unsure ?60's  . Cholecystectomy  02/21/2011    Procedure: LAPAROSCOPIC CHOLECYSTECTOMY WITH INTRAOPERATIVE CHOLANGIOGRAM;  Surgeon: Pedro Earls, MD;  Location: [facility];  Service: General;  Laterality: N/A;   Family History  Problem Relation Age of Onset  . COPD Sister   . Asthma Sister   . Emphysema Sister   . Hypertension Sister   . Hypertension Mother   . Colon cancer Neg Hx   . Heart disease Brother   . Heart disease Mother   . Heart disease Father    History  Substance Use Topics  . Smoking status: Former Smoker -- 3.00 packs/day for 40 years    Types: Cigarettes    Quit date: 04/11/1979  . Smokeless tobacco: Never Used  . Alcohol Use: No    Review of Systems  Constitutional: Positive for fever. Negative for diaphoresis.  Eyes: Negative for redness.  Respiratory: Negative for cough and shortness  of breath.   Cardiovascular: Negative for chest pain, palpitations and leg swelling.  Gastrointestinal: Negative for nausea, vomiting and abdominal pain.  Genitourinary: Positive for frequency. Negative for dysuria.  Musculoskeletal: Negative for back pain and neck pain.  Skin: Negative for rash.  Neurological: Negative for syncope and light-headedness.  Psychiatric/Behavioral: The patient is not nervous/anxious.     Allergies  Ace inhibitors; Codeine; and Lasix  Home Medications   Prior to Admission medications   Medication Sig Start Date End Date Taking? Authorizing Provider  albuterol (PROVENTIL HFA;VENTOLIN HFA) 108 (90 BASE) MCG/ACT inhaler Inhale 2 puffs into the lungs every 6 (six) hours as needed for  wheezing or shortness of breath. 06/24/14   Deneise Lever, MD  apixaban (ELIQUIS) 5 MG TABS tablet Take 1 tablet (5 mg total) by mouth 2 (two) times daily. 11/11/13   Minus Breeding, MD  budesonide (PULMICORT) 0.25 MG/2ML nebulizer solution Take 0.25 mg by nebulization 2 (two) times daily as needed. For asthma    Historical Provider, MD  calcium-vitamin D (OSCAL WITH D) 500-200 MG-UNIT per tablet Take 1 tablet by mouth 2 (two) times daily.    Historical Provider, MD  cholestyramine Lucrezia Starch) 4 G packet Take 1 packet (4 g total) by mouth 2 (two) times daily as needed. 04/22/14   Rowe Clack, MD  cloNIDine (CATAPRES) 0.3 MG tablet Take 1 tablet (0.3 mg total) by mouth 2 (two) times daily. 04/07/14   Rowe Clack, MD  denosumab (PROLIA) 60 MG/ML SOLN injection Inject 60 mg into the skin every 6 (six) months. Administer in upper arm, thigh, or abdomen 06/12/12   Rowe Clack, MD  diltiazem (CARDIZEM CD) 120 MG 24 hr capsule Take 1 capsule (120 mg total) by mouth 2 (two) times daily. 04/22/14   Rowe Clack, MD  DULERA 100-5 MCG/ACT AERO inhale 2 puffs by mouth once daily (THEN RINSE MOUTH AFTER USE) 07/14/14   Rowe Clack, MD  ferrous sulfate 325 (65 FE) MG tablet take 1 tablet by mouth three times a day with food 02/06/14   Minus Breeding, MD  hydrALAZINE (APRESOLINE) 50 MG tablet Take 1 tablet (50 mg total) by mouth 3 (three) times daily. 05/22/14   Rowe Clack, MD  ipratropium-albuterol (DUONEB) 0.5-2.5 (3) MG/3ML SOLN Take 3 mLs by nebulization every 6 (six) hours as needed (for shortness of breath). 10/08/13   Deneise Lever, MD  isosorbide mononitrate (IMDUR) 60 MG 24 hr tablet Take 1 tablet (60 mg total) by mouth daily. 04/22/14   Rowe Clack, MD  latanoprost (XALATAN) 0.005 % ophthalmic solution Place 1 drop into both eyes at bedtime.     Historical Provider, MD  LORazepam (ATIVAN) 0.5 MG tablet Take 1 tablet (0.5 mg total) by mouth 3 (three) times daily.  03/03/14   Rowe Clack, MD  losartan (COZAAR) 100 MG tablet Take 1 tablet (100 mg total) by mouth daily. 04/07/14   Rowe Clack, MD  montelukast (SINGULAIR) 10 MG tablet Take 10 mg by mouth at bedtime.    Historical Provider, MD  nebivolol (BYSTOLIC) 10 MG tablet Take 1 tablet (10 mg total) by mouth daily. 06/30/14   Minus Breeding, MD  nitroGLYCERIN (NITROSTAT) 0.4 MG SL tablet Place 1 tablet (0.4 mg total) under the tongue every 5 (five) minutes as needed for chest pain. 07/13/14   Minus Breeding, MD  pantoprazole (PROTONIX) 40 MG tablet take 1 tablet by mouth twice a day 02/24/14   Minus Breeding,  MD  potassium chloride SA (K-DUR,KLOR-CON) 20 MEQ tablet take 1 tablet by mouth twice a day 05/18/14   Minus Breeding, MD  pravastatin (PRAVACHOL) 80 MG tablet Take 1 tablet (80 mg total) by mouth every evening. 04/27/14   Minus Breeding, MD  Probiotic Product ([place]) Take 1 capsule by mouth daily.    Historical Provider, MD  RA VITAMIN B-12 TR 1000 MCG TBCR take 1 tablet by mouth once daily 01/23/14   Rowe Clack, MD  torsemide (DEMADEX) 20 MG tablet Take 3 tablets (60 mg total) by mouth daily. 04/22/14   Rowe Clack, MD   BP 181/68 mmHg  Pulse 82  Temp(Src) 98.3 F (36.8 C)  Resp 18  SpO2 98%   Physical Exam  Constitutional: He is oriented to person, place, and time. He appears well-developed and well-nourished.  HENT:  Head: Normocephalic and atraumatic.  Right Ear: Tympanic membrane, external ear and ear canal normal.  Left Ear: Tympanic membrane, external ear and ear canal normal.  Nose: Nose normal.  Mouth/Throat: Uvula is midline, oropharynx is clear and moist and mucous membranes are normal. Mucous membranes are not dry.  Eyes: Conjunctivae, EOM and lids are normal. Pupils are equal, round, and reactive to light.  Neck: Trachea normal and normal range of motion. Neck supple. Normal carotid pulses and no JVD present. No muscular tenderness  present. Carotid bruit is not present. No tracheal deviation present.  Cardiovascular: Normal rate, regular rhythm, S1 normal, S2 normal, normal heart sounds and intact distal pulses.  Exam reveals no distant heart sounds and no decreased pulses.   No murmur heard. Pulmonary/Chest: Effort normal and breath sounds normal. No respiratory distress. He has no wheezes. He exhibits no tenderness.  Abdominal: Soft. Normal aorta and bowel sounds are normal. There is no tenderness. There is no rebound and no guarding.  Musculoskeletal: Normal range of motion. He exhibits no edema.       Cervical back: He exhibits normal range of motion, no tenderness and no bony tenderness.  Neurological: He is alert and oriented to person, place, and time. He has normal strength and normal reflexes. No cranial nerve deficit or sensory deficit. He exhibits normal muscle tone. He displays a negative Romberg sign. Coordination and gait normal. GCS eye subscore is 4. GCS verbal subscore is 5. GCS motor subscore is 6.  Skin: Skin is warm and dry. He is not diaphoretic. No cyanosis. No pallor.  Psychiatric: He has a normal mood and affect.  Nursing note and vitals reviewed.   ED Course  Procedures (including critical care time) Labs Review Labs Reviewed - No data to display  Imaging Review No results found.   EKG Interpretation   Date/Time:  Sunday July 19 2014 09:20:27 EDT Ventricular Rate:  83 PR Interval:  236 QRS Duration: 118 QT Interval:  386 QTC Calculation: 453 R Axis:   46 Text Interpretation:  Sinus rhythm Prolonged PR interval Incomplete right  bundle branch block Low voltage, precordial leads No significant change  was found Confirmed by Wyvonnia Dusky  MD, STEPHEN ([id number]) on 07/19/2014 9:32:11  AM      9:16 AM Patient seen and examined. Work-up initiated. Medications ordered.   Vital signs reviewed and are as follows: BP 181/68 mmHg  Pulse 82  Temp(Src) 98.3 F (36.8 C)  Resp 18  SpO2 98%    9:44 AM BP 158/65 mmHg  Pulse 77  Temp(Src) 98.3 F (36.8 C)  Resp 18  SpO2 98%  1:21 PM Delay in UA resulting. Pt informed of results.   Will d/c to home. Encouraged PCP follow-up for discussion of blood pressure management.  Patient encouraged to return with worsening symptoms, chest pain, shortness of breath, or any other concerns. Patient and wife verbalized understanding and are in agreement with the plan.  MDM   Final diagnoses:  Essential hypertension   Patient with hypertension. No signs of end organ damage. Urine checked given history of urinary frequency, recent prostatitis and is normal. Patient appears well. Will discharge to home on his usual blood pressure regimen.   Carlisle Cater, PA-C 07/19/14 [place], MD 07/19/14 [phone]

## 2014-07-19 NOTE — Discharge Instructions (Signed)
Please read and follow all provided instructions.  Your diagnoses today include:  1. Essential hypertension    Your blood pressure was high today (BP): BP 173/56 mmHg   Pulse 71   Temp(Src) 98.3 F (36.8 C)   Resp 14   SpO2 97%  Tests performed today include:  Vital signs. See below for your results today.   Blood counts and electrolytes - no problems  Urine test - no infection  EKG - no problems  Medications prescribed:   None  Home care instructions:  Follow any educational materials contained in this packet.  Follow-up instructions: Please follow-up with your primary care provider in the next 7 days for a recheck of your symptoms and blood pressure.    Return instructions:   Please return to the Emergency Department if you experience worsening symptoms.   Return with severe chest pain, abdominal pain, or shortness of breath.   Return with severe headache, focal weakness, numbness, difficulty with speech or vision.  Return with loss of vision or sudden vision change.  Please return if you have any other emergent concerns.  Additional Information:  Your vital signs today were: BP 173/56 mmHg   Pulse 71   Temp(Src) 98.3 F (36.8 C)   Resp 14   SpO2 97% If your blood pressure (BP) was elevated above 135/85 this visit, please have this repeated by your doctor within one month. --------------

## 2014-07-19 NOTE — ED Notes (Signed)
Pt states that he took his BP medications this morning at 0630 and rechecked at 0830 reports bp increasing and was 638 systolic. Pt states that at that time he had a headache. Denies pain at this time. Pt alert and oriented at this time.

## 2014-07-20 ENCOUNTER — Telehealth: Payer: Self-pay | Admitting: *Deleted

## 2014-07-20 ENCOUNTER — Observation Stay (HOSPITAL_COMMUNITY): Payer: Medicare Other

## 2014-07-20 ENCOUNTER — Encounter (HOSPITAL_COMMUNITY): Payer: Self-pay | Admitting: Internal Medicine

## 2014-07-20 DIAGNOSIS — G4733 Obstructive sleep apnea (adult) (pediatric): Secondary | ICD-10-CM

## 2014-07-20 DIAGNOSIS — J9811 Atelectasis: Secondary | ICD-10-CM | POA: Diagnosis not present

## 2014-07-20 DIAGNOSIS — E876 Hypokalemia: Secondary | ICD-10-CM

## 2014-07-20 DIAGNOSIS — I10 Essential (primary) hypertension: Secondary | ICD-10-CM

## 2014-07-20 DIAGNOSIS — R509 Fever, unspecified: Secondary | ICD-10-CM | POA: Diagnosis not present

## 2014-07-20 DIAGNOSIS — R002 Palpitations: Secondary | ICD-10-CM | POA: Diagnosis not present

## 2014-07-20 DIAGNOSIS — I482 Chronic atrial fibrillation: Secondary | ICD-10-CM | POA: Diagnosis not present

## 2014-07-20 DIAGNOSIS — I160 Hypertensive urgency: Secondary | ICD-10-CM | POA: Diagnosis present

## 2014-07-20 LAB — BASIC METABOLIC PANEL
Anion gap: 7 (ref 5–15)
BUN: 13 mg/dL (ref 6–23)
CO2: 28 mmol/L (ref 19–32)
CREATININE: 0.9 mg/dL (ref 0.50–1.35)
Calcium: 8.8 mg/dL (ref 8.4–10.5)
Chloride: 104 mmol/L (ref 96–112)
GFR, EST NON AFRICAN AMERICAN: 78 mL/min — AB (ref >90)
GLUCOSE: 97 mg/dL (ref 70–99)
Potassium: 3.3 mmol/L — ABNORMAL LOW (ref 3.5–5.1)
Sodium: 139 mmol/L (ref 135–145)

## 2014-07-20 LAB — TROPONIN I
Troponin I: <0.03 ng/mL (ref <0.031)
Troponin I: <0.03 ng/mL (ref <0.031)

## 2014-07-20 LAB — INFLUENZA PANEL BY PCR (TYPE A & B)
H1N1 flu by pcr: NOT DETECTED
Influenza A By PCR: NEGATIVE
Influenza B By PCR: NEGATIVE

## 2014-07-20 LAB — MAGNESIUM: MAGNESIUM: 2.2 mg/dL (ref 1.5–2.5)

## 2014-07-20 LAB — TSH: TSH: 1.472 u[IU]/mL (ref 0.350–4.500)

## 2014-07-20 LAB — T4, FREE: FREE T4: 1.01 ng/dL (ref 0.80–1.80)

## 2014-07-20 LAB — CBC
HEMATOCRIT: 42.3 % (ref 39.0–52.0)
HEMOGLOBIN: 14.1 g/dL (ref 13.0–17.0)
MCH: 30.5 pg (ref 26.0–34.0)
MCHC: 33.3 g/dL (ref 30.0–36.0)
MCV: 91.6 fL (ref 78.0–100.0)
Platelets: 170 10*3/uL (ref 150–400)
RBC: 4.62 MIL/uL (ref 4.22–5.81)
RDW: 14.3 % (ref 11.5–15.5)
WBC: 9.9 10*3/uL (ref 4.0–10.5)

## 2014-07-20 LAB — SEDIMENTATION RATE: SED RATE: 15 mm/h (ref 0–16)

## 2014-07-20 MED ORDER — PANTOPRAZOLE SODIUM 40 MG PO TBEC
40.0000 mg | DELAYED_RELEASE_TABLET | Freq: Two times a day (BID) | ORAL | Status: DC
  Administered 2014-07-20 (×2): 40 mg via ORAL
  Filled 2014-07-20 (×2): qty 1

## 2014-07-20 MED ORDER — MOMETASONE FURO-FORMOTEROL FUM 100-5 MCG/ACT IN AERO
2.0000 | INHALATION_SPRAY | Freq: Two times a day (BID) | RESPIRATORY_TRACT | Status: DC
  Filled 2014-07-20: qty 8.8

## 2014-07-20 MED ORDER — SODIUM CHLORIDE 0.9 % IJ SOLN
3.0000 mL | Freq: Two times a day (BID) | INTRAMUSCULAR | Status: DC
  Administered 2014-07-20 (×2): 3 mL via INTRAVENOUS

## 2014-07-20 MED ORDER — FERROUS SULFATE 325 (65 FE) MG PO TABS
325.0000 mg | ORAL_TABLET | Freq: Every day | ORAL | Status: DC
  Filled 2014-07-20: qty 1

## 2014-07-20 MED ORDER — POTASSIUM CHLORIDE CRYS ER 20 MEQ PO TBCR
20.0000 meq | EXTENDED_RELEASE_TABLET | Freq: Two times a day (BID) | ORAL | Status: DC
  Administered 2014-07-20: 20 meq via ORAL
  Filled 2014-07-20 (×2): qty 1

## 2014-07-20 MED ORDER — VITAMIN B-12 1000 MCG PO TABS
1000.0000 ug | ORAL_TABLET | Freq: Every day | ORAL | Status: DC
  Administered 2014-07-20: 1000 ug via ORAL
  Filled 2014-07-20: qty 1

## 2014-07-20 MED ORDER — ACETAMINOPHEN 650 MG RE SUPP: 650.0000 mg | Freq: Four times a day (QID) | RECTAL | Status: DC | PRN

## 2014-07-20 MED ORDER — FERROUS SULFATE 325 (65 FE) MG PO TABS
325.0000 mg | ORAL_TABLET | Freq: Every day | ORAL | Status: DC
  Administered 2014-07-20: 325 mg via ORAL
  Filled 2014-07-20 (×2): qty 1

## 2014-07-20 MED ORDER — NITROGLYCERIN 0.4 MG SL SUBL: 0.4000 mg | SUBLINGUAL_TABLET | SUBLINGUAL | Status: DC | PRN

## 2014-07-20 MED ORDER — BUDESONIDE 0.25 MG/2ML IN SUSP
0.2500 mg | Freq: Every day | RESPIRATORY_TRACT | Status: DC
  Administered 2014-07-20: 0.25 mg via RESPIRATORY_TRACT
  Filled 2014-07-20 (×2): qty 2

## 2014-07-20 MED ORDER — MONTELUKAST SODIUM 10 MG PO TABS
10.0000 mg | ORAL_TABLET | Freq: Every day | ORAL | Status: DC
  Administered 2014-07-20: 10 mg via ORAL
  Filled 2014-07-20 (×2): qty 1

## 2014-07-20 MED ORDER — MOMETASONE FURO-FORMOTEROL FUM 100-5 MCG/ACT IN AERO
2.0000 | INHALATION_SPRAY | Freq: Two times a day (BID) | RESPIRATORY_TRACT | Status: DC
  Administered 2014-07-20: 2 via RESPIRATORY_TRACT
  Filled 2014-07-20: qty 8.8

## 2014-07-20 MED ORDER — HYDRALAZINE HCL 50 MG PO TABS: 75.0000 mg | ORAL_TABLET | Freq: Three times a day (TID) | ORAL | Status: DC

## 2014-07-20 MED ORDER — LOSARTAN POTASSIUM 50 MG PO TABS
100.0000 mg | ORAL_TABLET | Freq: Every evening | ORAL | Status: DC
  Filled 2014-07-20: qty 2

## 2014-07-20 MED ORDER — LATANOPROST 0.005 % OP SOLN
1.0000 [drp] | Freq: Every day | OPHTHALMIC | Status: DC
  Administered 2014-07-20: 1 [drp] via OPHTHALMIC
  Filled 2014-07-20 (×2): qty 2.5

## 2014-07-20 MED ORDER — TORSEMIDE 20 MG PO TABS
60.0000 mg | ORAL_TABLET | Freq: Every day | ORAL | Status: DC
  Administered 2014-07-20: 60 mg via ORAL
  Filled 2014-07-20: qty 3

## 2014-07-20 MED ORDER — DILTIAZEM HCL ER COATED BEADS 120 MG PO CP24
120.0000 mg | ORAL_CAPSULE | Freq: Two times a day (BID) | ORAL | Status: DC
  Administered 2014-07-20 (×2): 120 mg via ORAL
  Filled 2014-07-20 (×3): qty 1

## 2014-07-20 MED ORDER — NEBIVOLOL HCL 10 MG PO TABS
10.0000 mg | ORAL_TABLET | Freq: Every day | ORAL | Status: DC
  Administered 2014-07-20: 10 mg via ORAL
  Filled 2014-07-20: qty 1

## 2014-07-20 MED ORDER — CALCIUM CARBONATE-VITAMIN D 500-200 MG-UNIT PO TABS
1.0000 | ORAL_TABLET | Freq: Two times a day (BID) | ORAL | Status: DC
  Administered 2014-07-20: 1 via ORAL
  Filled 2014-07-20 (×2): qty 1

## 2014-07-20 MED ORDER — ALBUTEROL SULFATE (2.5 MG/3ML) 0.083% IN NEBU: 3.0000 mL | INHALATION_SOLUTION | Freq: Four times a day (QID) | RESPIRATORY_TRACT | Status: DC | PRN

## 2014-07-20 MED ORDER — HYDRALAZINE HCL 50 MG PO TABS
75.0000 mg | ORAL_TABLET | Freq: Three times a day (TID) | ORAL | Status: DC
  Administered 2014-07-20 (×3): 75 mg via ORAL
  Filled 2014-07-20 (×6): qty 1

## 2014-07-20 MED ORDER — CLONIDINE HCL 0.3 MG PO TABS
0.3000 mg | ORAL_TABLET | Freq: Two times a day (BID) | ORAL | Status: DC
  Administered 2014-07-20 (×2): 0.3 mg via ORAL
  Filled 2014-07-20 (×3): qty 1

## 2014-07-20 MED ORDER — LORAZEPAM 0.5 MG PO TABS
0.5000 mg | ORAL_TABLET | Freq: Three times a day (TID) | ORAL | Status: DC
  Administered 2014-07-20 (×2): 0.5 mg via ORAL
  Filled 2014-07-20 (×2): qty 1

## 2014-07-20 MED ORDER — PRAVASTATIN SODIUM 80 MG PO TABS
80.0000 mg | ORAL_TABLET | Freq: Every evening | ORAL | Status: DC
  Filled 2014-07-20: qty 1

## 2014-07-20 MED ORDER — CHOLESTYRAMINE 4 G PO PACK
4.0000 g | PACK | Freq: Every day | ORAL | Status: DC
  Filled 2014-07-20: qty 1

## 2014-07-20 MED ORDER — APIXABAN 5 MG PO TABS
5.0000 mg | ORAL_TABLET | Freq: Two times a day (BID) | ORAL | Status: DC
  Administered 2014-07-20 (×2): 5 mg via ORAL
  Filled 2014-07-20 (×3): qty 1

## 2014-07-20 MED ORDER — IPRATROPIUM-ALBUTEROL 0.5-2.5 (3) MG/3ML IN SOLN
3.0000 mL | Freq: Every day | RESPIRATORY_TRACT | Status: DC
  Administered 2014-07-20: 3 mL via RESPIRATORY_TRACT
  Filled 2014-07-20: qty 3

## 2014-07-20 MED ORDER — LORAZEPAM 0.5 MG PO TABS: 0.5000 mg | ORAL_TABLET | Freq: Three times a day (TID) | ORAL | Status: DC

## 2014-07-20 MED ORDER — ACETAMINOPHEN 325 MG PO TABS: 650.0000 mg | ORAL_TABLET | Freq: Four times a day (QID) | ORAL | Status: DC | PRN

## 2014-07-20 MED ORDER — ONDANSETRON HCL 4 MG/2ML IJ SOLN: 4.0000 mg | Freq: Four times a day (QID) | INTRAMUSCULAR | Status: DC | PRN

## 2014-07-20 MED ORDER — ONDANSETRON HCL 4 MG PO TABS: 4.0000 mg | ORAL_TABLET | Freq: Four times a day (QID) | ORAL | Status: DC | PRN

## 2014-07-20 MED ORDER — ISOSORBIDE MONONITRATE ER 60 MG PO TB24
60.0000 mg | ORAL_TABLET | Freq: Every day | ORAL | Status: DC
  Administered 2014-07-20: 60 mg via ORAL
  Filled 2014-07-20: qty 1

## 2014-07-20 NOTE — Telephone Encounter (Signed)
Recommend we deny this because of his HBP and Heart history. We can discuss at next ov. Please take it off his med list.

## 2014-07-20 NOTE — ED Provider Notes (Signed)
  ED ECG REPORT   Date: 12/112/15  Rate:85  Rhythm: normal sinus rhythm  QRS Axis: normal  Intervals: PR prolonged  ST/T Wave abnormalities: normal  Conduction Disutrbances:first-degree A-V block   Narrative Interpretation:   Old EKG Reviewed: unchanged  I have personally reviewed the EKG tracing and agree with the computerized printout as noted.   Julianne Rice, MD 07/20/14 [phone]

## 2014-07-20 NOTE — H&P (Signed)
[facility] History and Physical  [name]. VOP:[id number] DOB: Mar 03, 1931 DOA: 07/19/2014  Referring physician: ER physician. PCP: Gwendolyn Grant, MD   Chief Complaint: Elevated blood pressure and headache.  HPI: [name]. is a 81 y.o. male with history of hypertension, asthma, OSA, dyslipidemia, atrial fibrillation presents to the ER because of persistent elevated blood pressure. Patient did come to the ER earlier today since patient blood pressure was more than 638 systolic. Patient states since morning 3 at [facility] patient woke up and found that he was uncomfortable but denies any chest pain or shortness of breath and he had some frontal headache. He checked his blood pressure is more than 200 and he came to the ER. In the morning patient was discharged home after stabilizing but patient came back again after he found that his blood pressure was still persistently elevated. In addition patient also has been complaining of some palpitation off and on for last 2 days and the ER physician noted some nonsustained V. tach since the ER. On call cardiologist Dr. Wynonia Lawman was consulted by the ER physician and at this time Dr. Wynonia Lawman has advised to admit patient for observation. Patient on exam denies any focal deficits dizziness chest pain shortness of breath nausea vomiting abdominal pain diarrhea chills. Patient's headache is mostly in the frontal area.   Review of Systems: As presented in the history of presenting illness, rest negative.  Past Medical History  Diagnosis Date  . CORONARY HEART DISEASE     a. s/p CABG;  b.  cath 06/27/10: S-Dx occluded, S-PDA 80-90% (tx with PCI); S-OM ok, L-LAD ok;  EF 65-70%  c.  s/p Promus DES to S-PDA 06/2010;   d. Myoview 8/12: low risk  . FIBRILLATION, ATRIAL     post op; ?documented during hosp. 06/2010  . SLEEP APNEA 09/2001    NPSG AHI 22/HR  . ALLERGIC RHINITIS   . ASTHMA   . Depression   . GERD     with HH, hx esophageal stricture   . DYSLIPIDEMIA   . HYPERTENSION     Echo 3/12: EF 55-60%; mod LVH; mild AS/AI; LAE; PASP 38; mild pulmo HTN  . Personal history of alcoholism   . Diverticulosis   . Benign liver cyst   . Skin cancer     L forearm  . Cataract     surgery to both eyes  . Esophageal stricture   . Hiatal hernia   . Pneumonia 04/2013 hosp  . Shortness of breath    Past Surgical History  Procedure Laterality Date  . Coronary artery bypass graft  02/2007  . Tonsillectomy    . Coronary angioplasty with stent placement  07/2010  . Cataract extraction, bilateral  2007  . Hernia repair  unsure ?60's  . Cholecystectomy  02/21/2011    Procedure: LAPAROSCOPIC CHOLECYSTECTOMY WITH INTRAOPERATIVE CHOLANGIOGRAM;  Surgeon: Pedro Earls, MD;  Location: [facility] ORS;  Service: General;  Laterality: N/A;   Social History:  reports that he quit smoking about 35 years ago. His smoking use included Cigarettes. He has a 120 pack-year smoking history. He has never used smokeless tobacco. He reports that he does not drink alcohol or use illicit drugs. Where does patient live home. Can patient participate in ADLs? Yes.  Allergies  Allergen Reactions  . Ace Inhibitors Other (See Comments)    Severe asthma (COPD)  . Other Other (See Comments)    Maple trees, allergy symptoms  . Codeine Nausea  And Vomiting  . Lasix [Furosemide] Itching    Family History:  Family History  Problem Relation Age of Onset  . COPD Sister   . Asthma Sister   . Emphysema Sister   . Hypertension Sister   . Hypertension Mother   . Colon cancer Neg Hx   . Heart disease Brother   . Heart disease Mother   . Heart disease Father       Prior to Admission medications   Medication Sig Start Date End Date Taking? Authorizing Provider  albuterol (PROVENTIL HFA;VENTOLIN HFA) 108 (90 BASE) MCG/ACT inhaler Inhale 2 puffs into the lungs every 6 (six) hours as needed for wheezing or shortness of breath. 06/24/14  Yes Deneise Lever, MD  apixaban  (ELIQUIS) 5 MG TABS tablet Take 1 tablet (5 mg total) by mouth 2 (two) times daily. 11/11/13  Yes Minus Breeding, MD  budesonide (PULMICORT) 0.25 MG/2ML nebulizer solution Take 0.25 mg by nebulization at bedtime. For asthma   Yes Historical Provider, MD  calcium-vitamin D (OSCAL WITH D) 500-200 MG-UNIT per tablet Take 1 tablet by mouth 2 (two) times daily.   Yes Historical Provider, MD  cholestyramine Lucrezia Starch) 4 G packet Take 1 packet (4 g total) by mouth 2 (two) times daily as needed. Patient taking differently: Take 4 g by mouth daily at 6 PM.  04/22/14  Yes Rowe Clack, MD  cloNIDine (CATAPRES) 0.3 MG tablet Take 1 tablet (0.3 mg total) by mouth 2 (two) times daily. 04/07/14  Yes Rowe Clack, MD  diltiazem (CARDIZEM CD) 120 MG 24 hr capsule Take 1 capsule (120 mg total) by mouth 2 (two) times daily. 04/22/14  Yes Rowe Clack, MD  DULERA 100-5 MCG/ACT AERO inhale 2 puffs by mouth once daily (THEN RINSE MOUTH AFTER USE) 07/14/14  Yes Rowe Clack, MD  ferrous sulfate 325 (65 FE) MG tablet take 1 tablet by mouth three times a day with food 02/06/14  Yes Minus Breeding, MD  hydrALAZINE (APRESOLINE) 50 MG tablet Take 1 tablet (50 mg total) by mouth 3 (three) times daily. 05/22/14  Yes Rowe Clack, MD  ipratropium-albuterol (DUONEB) 0.5-2.5 (3) MG/3ML SOLN Take 3 mLs by nebulization every 6 (six) hours as needed (for shortness of breath). Patient taking differently: Take 3 mLs by nebulization at bedtime.  10/08/13  Yes Deneise Lever, MD  isosorbide mononitrate (IMDUR) 60 MG 24 hr tablet Take 1 tablet (60 mg total) by mouth daily. 04/22/14  Yes Rowe Clack, MD  latanoprost (XALATAN) 0.005 % ophthalmic solution Place 1 drop into both eyes at bedtime.    Yes Historical Provider, MD  LORazepam (ATIVAN) 0.5 MG tablet Take 1 tablet (0.5 mg total) by mouth 3 (three) times daily. 03/03/14  Yes Rowe Clack, MD  losartan (COZAAR) 100 MG tablet Take 1 tablet (100 mg total)  by mouth daily. Patient taking differently: Take 100 mg by mouth every evening.  04/07/14  Yes Rowe Clack, MD  LYSINE PO Take 1 tablet by mouth daily at 12 noon.   Yes Historical Provider, MD  montelukast (SINGULAIR) 10 MG tablet Take 10 mg by mouth at bedtime.   Yes Historical Provider, MD  nebivolol (BYSTOLIC) 10 MG tablet Take 1 tablet (10 mg total) by mouth daily. 06/30/14  Yes Minus Breeding, MD  pantoprazole (PROTONIX) 40 MG tablet take 1 tablet by mouth twice a day 02/24/14  Yes Minus Breeding, MD  potassium chloride SA (K-DUR,KLOR-CON) 20 MEQ tablet take 1  tablet by mouth twice a day 05/18/14  Yes Minus Breeding, MD  pravastatin (PRAVACHOL) 80 MG tablet Take 1 tablet (80 mg total) by mouth every evening. 04/27/14  Yes Minus Breeding, MD  Probiotic Product ([place]) Take 1 capsule by mouth every evening.    Yes Historical Provider, MD  RA VITAMIN B-12 TR 1000 MCG TBCR take 1 tablet by mouth once daily 01/23/14  Yes Rowe Clack, MD  torsemide (DEMADEX) 20 MG tablet Take 3 tablets (60 mg total) by mouth daily. Patient taking differently: Take 20 mg by mouth 3 (three) times daily.  04/22/14  Yes Rowe Clack, MD  denosumab (PROLIA) 60 MG/ML SOLN injection Inject 60 mg into the skin every 6 (six) months. Administer in upper arm, thigh, or abdomen 06/12/12   Rowe Clack, MD  nitroGLYCERIN (NITROSTAT) 0.4 MG SL tablet Place 1 tablet (0.4 mg total) under the tongue every 5 (five) minutes as needed for chest pain. 07/13/14   Minus Breeding, MD    Physical Exam: Filed Vitals:   07/19/14 2030 07/19/14 2045 07/19/14 2100 07/19/14 2142  BP: 184/61 175/76 178/73 195/65  Pulse: 92 94 95 96  Temp:    98.5 F (36.9 C)  TempSrc:    Oral  Resp: 12 17 12 16   Height:    5\' 2"  (1.575 m)  Weight:    67.3 kg (148 lb 5.9 oz)  SpO2: 97% 97% 97% 94%     General:  Well-developed and nourished.  Eyes: Anicteric no pallor.  ENT: No discharge from the ears eyes nose  and mouth.  Neck: No mass felt.  Cardiovascular: S1-S2 heard.  Respiratory: No rhonchi or crepitations.  Abdomen: Soft nontender bowel sounds present.  Skin: No rash.  Musculoskeletal: No edema.  Psychiatric: Appears normal.  Neurologic: Alert awake oriented to time place and person. Moves all extremities.  Labs on Admission:  Basic Metabolic Panel:  Recent Labs Lab 07/19/14 0936  NA 139  K 3.7  CL 106  CO2 21  GLUCOSE 106*  BUN 15  CREATININE 0.90  CALCIUM 8.9   Liver Function Tests: No results for input(s): AST, ALT, ALKPHOS, BILITOT, PROT, ALBUMIN in the last 168 hours. No results for input(s): LIPASE, AMYLASE in the last 168 hours. No results for input(s): AMMONIA in the last 168 hours. CBC:  Recent Labs Lab 07/19/14 0936  WBC 6.4  HGB 13.5  HCT 41.6  MCV 93.5  PLT 169   Cardiac Enzymes:  Recent Labs Lab 07/19/14 1814  TROPONINI <0.03    BNP (last 3 results) No results for input(s): BNP in the last 8760 hours.  ProBNP (last 3 results) No results for input(s): PROBNP in the last 8760 hours.  CBG: No results for input(s): GLUCAP in the last 168 hours.  Radiological Exams on Admission: No results found.  EKG: Independently reviewed. Normal sinus rhythm with RBBB.  Assessment/Plan Principal Problem:   Hypertensive urgency Active Problems:   Asthma with COPD   Obstructive sleep apnea   Chronic diastolic heart failure   Chronic atrial fibrillation   Palpitations   1. Hypertensive urgency - at this time I have placed patient on when necessary IV hydralazine and increase patient's hydralazine dose 50 mg 3 times a day to 75 mg 3 times daily and continue his other home medications and closely follow blood pressure transfer. 2. Headache - most likely secondary to #1. If patient's headache does not get improved with blood pressure control then may  need further imaging. Check sedimentation rate. 3. Palpitations with nonsustained V. tach in the  ER - check troponin and 2-D echo and closely observe in telemetry. Check magnesium levels and metabolic panel closely. 4. Chronic diastolic heart failure - last evening admission in 2014 was 55-60%. Continue diuretics and potassium. Patient appears compensated. 5. Mild fever - check chest x-ray and influenza PCR. 6. OSA on C Pap. 7. Dyslipidemia. Continue present medications. 8. Chronic atrial fibrillation - presently rate controlled. Continue rate limiting medications and anticoagulants. Patient's chads 2 vasc score is more than 2.   DVT ProphylaxisApixaban.  Code Status: Full code.  Family Communication: None.  Disposition Plan: Admit for observation.    Kuron Docken N. [facility] Pager [phone].  If 7PM-7AM, please contact night-coverage [email or web address] Password [facility] 07/20/2014, 12:11 AM

## 2014-07-20 NOTE — Progress Notes (Signed)
UR completed 

## 2014-07-20 NOTE — Telephone Encounter (Signed)
[facility] submitted refill request for Nuvigil Paper fax submitted to me for refills and refill was ok'd per CY. Read below:  Per phone message in March 2016  Expand All Collapse All   Ok albuterol. I think we had better stay off Nuvigil because of his blood pressure and heart disease history.         Pt was just seen in hospital for elevated bp on yesterday.

## 2014-07-20 NOTE — Discharge Summary (Signed)
Physician Discharge Summary  [name]. SNK:[id number] DOB: 12-16-1930 DOA: 07/19/2014  PCP: Gwendolyn Grant, MD  Admit date: 07/19/2014 Discharge date: 07/20/2014  Time spent: 25 minutes  Recommendations for Outpatient Follow-up:  1. D/c home with PCP follow up in 1 week  Discharge Diagnoses:  Principal Problem:   Hypertensive urgency  Active Problems:   Asthma with COPD   Obstructive sleep apnea   Chronic diastolic heart failure   Chronic atrial fibrillation   Palpitations   Discharge Condition: fair  Diet recommendation: heart healthy with 2gm sodium  Filed Weights   07/19/14 1732 07/19/14 2142 07/20/14 0449  Weight: 68.13 kg (150 lb 3.2 oz) 67.3 kg (148 lb 5.9 oz) 66.8 kg (147 lb 4.3 oz)    History of present illness:  Please refer to admission H&P for details, in brief, 81 y.o. male with history of hypertension, asthma, OSA, dyslipidemia, atrial fibrillation presents to the ER because of persistent elevated blood pressure. Patient did come to the ER earlier today since patient blood pressure was more than 937 systolic. Patient reported that  he  woke up and found that he was uncomfortable and had frontal headache, but denied any chest pain or shortness of breath. He checked his blood pressure and was  more than 200 and he came to the ER. In the morning patient was discharged home after stabilizing but patient came back again after he found that his blood pressure was still persistently elevated. In addition patient also has been complaining of some palpitation off and on for last 2 days and the ER physician noted some nonsustained V. tach since the ER. On call cardiologist Dr. Wynonia Lawman was consulted by the ER physician and at this time Dr. Wynonia Lawman has advised to admit patient for observation. Patient on exam denied any focal deficits dizziness chest pain shortness of breath nausea vomiting abdominal pain diarrhea chills.   Hospital Course:  Hypertensive urgency No clear  cause for his symptoms. Pt on 6 different BP medications and reports compliance. BP reportedly stable at home. On reviewing his chart , pt did have elevated SBP in 160-180 range past few months during outpt follow up for which his hydralazine dose was increased by cardiology.  patient's home meds were continued and hydralazine dose increased to 75 mg every 8 hours. Patient's blood pressure has been fairly stable since admission. His headache symptom which is likely due to uncontrolled hypertension has now resolved.  Palpitations with nonsustained V. Tach in ED Serial troponin negative. Low potassium replenished. No further abnormality on the monitor. EKG reviewed and stable.  Chronic diastolic CHF Euvolemic. Continue diuretics.  OSA  Continue CPAP  Mild fever on admission Resolved. Flu PCR negative.  Chronic A. fib Rate controlled. Continue bystolic and eliquis  Pt is clinically stable to be discharged home. He is instructed to take his blood pressure medications on time, monitor his diet and keep a log of his blood pressure readings and showed to his PCP during outpatient follow-up. He is also instructed to call his PCP or return to ED if his SBP is persistently elevated above 902 or diastolic above 90 and has severe symptoms like chest pain, shortness of breath, headaches, dizziness, blurred vision, hematuria, confusion or weakness.   Procedures:  None  Consultations:  None  Discharge Exam: Filed Vitals:   07/20/14 1355  BP: 100/55  Pulse: 79  Temp: 98.1 F (36.7 C)  Resp:     General: Elderly male in no acute distress HEENT:  No pallor, moist oral mucosa Chest: Clear to auscultation bilaterally, no added sounds CVS: S1 and S2 irregular, no murmurs rub or gallop GI: Soft, nondistended, nontender, bowel sounds present Musculoskeletal: Warm, no edema CNS: Alert and oriented   Discharge Instructions    Current Discharge Medication List    CONTINUE these  medications which have CHANGED   Details  hydrALAZINE (APRESOLINE) 50 MG tablet Take 1.5 tablets (75 mg total) by mouth 3 (three) times daily. Qty: 90 tablet, Refills: 5      CONTINUE these medications which have NOT CHANGED   Details  albuterol (PROVENTIL HFA;VENTOLIN HFA) 108 (90 BASE) MCG/ACT inhaler Inhale 2 puffs into the lungs every 6 (six) hours as needed for wheezing or shortness of breath. Qty: 8 g, Refills: 5    apixaban (ELIQUIS) 5 MG TABS tablet Take 1 tablet (5 mg total) by mouth 2 (two) times daily. Qty: 60 tablet, Refills: 6    budesonide (PULMICORT) 0.25 MG/2ML nebulizer solution Take 0.25 mg by nebulization at bedtime. For asthma    calcium-vitamin D (OSCAL WITH D) 500-200 MG-UNIT per tablet Take 1 tablet by mouth 2 (two) times daily.    cholestyramine (QUESTRAN) 4 G packet Take 1 packet (4 g total) by mouth 2 (two) times daily as needed. Qty: 60 each, Refills: 2    cloNIDine (CATAPRES) 0.3 MG tablet Take 1 tablet (0.3 mg total) by mouth 2 (two) times daily. Qty: 60 tablet, Refills: 11   Associated Diagnoses: Essential hypertension    diltiazem (CARDIZEM CD) 120 MG 24 hr capsule Take 1 capsule (120 mg total) by mouth 2 (two) times daily. Qty: 60 capsule, Refills: 5    DULERA 100-5 MCG/ACT AERO inhale 2 puffs by mouth once daily (THEN RINSE MOUTH AFTER USE) Qty: 17.6 g, Refills: 5    ferrous sulfate 325 (65 FE) MG tablet take 1 tablet by mouth three times a day with food Qty: 90 tablet, Refills: 3    ipratropium-albuterol (DUONEB) 0.5-2.5 (3) MG/3ML SOLN Take 3 mLs by nebulization every 6 (six) hours as needed (for shortness of breath). Qty: 360 mL, Refills: 6    isosorbide mononitrate (IMDUR) 60 MG 24 hr tablet Take 1 tablet (60 mg total) by mouth daily. Qty: 90 tablet, Refills: 3    latanoprost (XALATAN) 0.005 % ophthalmic solution Place 1 drop into both eyes at bedtime.     LORazepam (ATIVAN) 0.5 MG tablet Take 1 tablet (0.5 mg total) by mouth 3 (three)  times daily. Qty: 90 tablet, Refills: 5    losartan (COZAAR) 100 MG tablet Take 1 tablet (100 mg total) by mouth daily. Qty: 30 tablet, Refills: 11    LYSINE PO Take 1 tablet by mouth daily at 12 noon.    montelukast (SINGULAIR) 10 MG tablet Take 10 mg by mouth at bedtime.    nebivolol (BYSTOLIC) 10 MG tablet Take 1 tablet (10 mg total) by mouth daily. Qty: 30 tablet, Refills: 6    pantoprazole (PROTONIX) 40 MG tablet take 1 tablet by mouth twice a day Qty: 60 tablet, Refills: 0    potassium chloride SA (K-DUR,KLOR-CON) 20 MEQ tablet take 1 tablet by mouth twice a day Qty: 60 tablet, Refills: 1    pravastatin (PRAVACHOL) 80 MG tablet Take 1 tablet (80 mg total) by mouth every evening. Qty: 30 tablet, Refills: 3    Probiotic Product ([employer]ON HEALTH PO) Take 1 capsule by mouth every evening.     RA VITAMIN B-12 TR 1000 MCG TBCR take  1 tablet by mouth once daily Qty: 30 tablet, Refills: 11    torsemide (DEMADEX) 20 MG tablet Take 3 tablets (60 mg total) by mouth daily. Qty: 90 tablet, Refills: 5    denosumab (PROLIA) 60 MG/ML SOLN injection Inject 60 mg into the skin every 6 (six) months. Administer in upper arm, thigh, or abdomen Qty: 1 mL, Refills: 5   Associated Diagnoses: Osteoporosis    nitroGLYCERIN (NITROSTAT) 0.4 MG SL tablet Place 1 tablet (0.4 mg total) under the tongue every 5 (five) minutes as needed for chest pain. Qty: 25 tablet, Refills: 0       Allergies  Allergen Reactions  . Ace Inhibitors Other (See Comments)    Severe asthma (COPD)  . Other Other (See Comments)    Maple trees, allergy symptoms  . Codeine Nausea And Vomiting  . Lasix [Furosemide] Itching   Follow-up Information    Follow up with Gwendolyn Grant, MD. Schedule an appointment as soon as possible for a visit in 1 week.   Specialty:  Internal Medicine   Contact information:   [address] [place] [address] [phone]        The results  of significant diagnostics from this hospitalization (including imaging, microbiology, ancillary and laboratory) are listed below for reference.    Significant Diagnostic Studies: Dg Chest Port 1 View  07/20/2014   CLINICAL DATA:  Fever.  EXAM: PORTABLE CHEST - 1 VIEW  COMPARISON:  03/26/2014  FINDINGS: Prior median sternotomy. Numerous leads and wires project over the chest. Midline trachea. Mild cardiomegaly. Atherosclerosis in the transverse aorta. No pleural effusion or pneumothorax. No congestive failure. Suspect mild hyperinflation. Minimal bibasilar atelectasis or scarring.  IMPRESSION: No acute process or explanation for fever.  Mild cardiomegaly with suspicion of hyperinflation.   Electronically Signed   By: Abigail Miyamoto M.D.   On: 07/20/2014 08:06    Microbiology: No results found for this or any previous visit (from the past 240 hour(s)).   Labs: Basic Metabolic Panel:  Recent Labs Lab 07/19/14 0936 07/20/14 0040  NA 139 139  K 3.7 3.3*  CL 106 104  CO2 21 28  GLUCOSE 106* 97  BUN 15 13  CREATININE 0.90 0.90  CALCIUM 8.9 8.8  MG  --  2.2   Liver Function Tests: No results for input(s): AST, ALT, ALKPHOS, BILITOT, PROT, ALBUMIN in the last 168 hours. No results for input(s): LIPASE, AMYLASE in the last 168 hours. No results for input(s): AMMONIA in the last 168 hours. CBC:  Recent Labs Lab 07/19/14 0936 07/20/14 0040  WBC 6.4 9.9  HGB 13.5 14.1  HCT 41.6 42.3  MCV 93.5 91.6  PLT 169 170   Cardiac Enzymes:  Recent Labs Lab 07/19/14 1814 07/20/14 0040 07/20/14 0800 07/20/14 1158  TROPONINI <0.03 <0.03 <0.03 <0.03   BNP: BNP (last 3 results) No results for input(s): BNP in the last 8760 hours.  ProBNP (last 3 results) No results for input(s): PROBNP in the last 8760 hours.  CBG: No results for input(s): GLUCAP in the last 168 hours.     SignedLouellen Molder  [facility] 07/20/2014, 3:40 PM

## 2014-07-21 ENCOUNTER — Telehealth: Payer: Self-pay | Admitting: *Deleted

## 2014-07-21 LAB — T3, FREE: T3, Free: 2.5 pg/mL (ref 2.0–4.4)

## 2014-07-21 NOTE — Telephone Encounter (Signed)
Transition Care Management Follow-up Telephone Call D/C 07/20/14  How have you been since you were released from the hospital? Pt states he feels ok   Do you understand why you were in the hospital? YES   Do you understand the discharge instrcutions? YES  Items Reviewed:  Medications reviewed: YES/NO/WILD [id number]}  Allergies reviewed: YES  Dietary changes reviewed: YES, heart healthy with 2 gm sodium  Referrals reviewed: No referral needed   Functional Questionnaire:   Activities of Daily Living (ADLs):   He states he are independent in the following: ambulation, bathing and hygiene, feeding, continence, grooming, toileting and dressing States he doesn't require assistance w   Any transportation issues/concerns?: NO   Any patient concerns? NO   Confirmed importance and date/time of follow-up visits scheduled: YES, pt had made appt for 07/27/14 w/Dr. Linna Darner advise to keep appt   Confirmed with patient if condition begins to worsen call PCP or go to the ER.

## 2014-07-24 ENCOUNTER — Telehealth: Payer: Self-pay | Admitting: *Deleted

## 2014-07-24 NOTE — Telephone Encounter (Signed)
[facility] [place] [facility] [place] Patient Name: [name] Gender: Male DOB: [birth year] Age: 81 Y 5 M 22 D Return Phone Number: [phone] (Primary) Address: City/State/Zip: [place] Client [place] [facility] - Client Client Site [place] - Night Physician Gwendolyn Grant Contact Type Call Call Type Triage / Clinical Caller Name Rod Holler Relationship To Patient Spouse Return Phone Number [phone] (Primary) Chief Complaint BLOOD PRESSURE HIGH - Systolic (top number) 650 or greater (with symptoms) Initial Comment Caller states husband is being treated for blood pressure. 221/105. Itching and has chills. PreDisposition Go to ED Nurse Assessment Nurse: Martyn Ehrich, RN, [facility] Ice Date/Time Eilene Ghazi Time): 07/19/2014 4:47:46 PM Confirm and document reason for call. If symptomatic, describe symptoms. ---Pt has 221/105 BP. Pt was in ER all morning for this and they sent him home but BP came down at the ER. Now it is high again. They didnt find anything or change medication. Has the patient traveled out of the country within the last 30 days? ---No Does the patient require triage? ---Yes Related visit to physician within the last 2 weeks? ---Yes Does the PT have any chronic conditions? (i.e. diabetes, asthma, etc.) ---Yes List chronic conditions. ---asthma, HTN, CHF Guidelines Guideline Title Affirmed Question Affirmed Notes Nurse Date/Time (Eastern Time) High Blood Pressure [1] BP # 200/120 AND [2] having NO cardiac or neurologic symptoms Martyn Ehrich, RN, [facility] 3/54/6568 1:27:51 PM Disp. Time Eilene Ghazi Time) Disposition Final User 07/19/2014 4:41:21 PM Send to Urgent Queue Dorice Lamas 07/19/2014 4:53:02 PM See Physician within 4 Hours (or PCP triage) Yes Gaddy, RN, Alphia Kava Understands: Yes PLEASE NOTE: All timestamps contained within this report are represented as [place] Standard  Time. CONFIDENTIALTY NOTICE: This fax transmission is intended only for the addressee. It contains information that is legally privileged, confidential or otherwise protected from use or disclosure. If you are not the intended recipient, you are strictly prohibited from reviewing, disclosing, copying using or disseminating any of this information or taking any action in reliance on or regarding this information. If you have received this fax in error, please notify us immediately by telephone so that we can arrange for its return to [place]. Phone: [phone], Toll-Free: [phone], Fax: [phone] Page: 2 of 2 Call Id: [id number] Disagree/Comply: Comply Care Advice Given Per Guideline SEE PHYSICIAN WITHIN 4 HOURS (or PCP triage): * Weakness or numbness of the face, arm or leg on one side of the body occurs * Difficulty walking, difficulty talking, or severe headache occurs * Chest pain or difficulty breathing occurs * You become worse. After Care Instructions Given Call Event Type User Date / Time Description Referrals GO TO FACILITY UNDECIDED

## 2014-07-25 ENCOUNTER — Ambulatory Visit (INDEPENDENT_AMBULATORY_CARE_PROVIDER_SITE_OTHER): Payer: Medicare Other | Admitting: Emergency Medicine

## 2014-07-25 ENCOUNTER — Ambulatory Visit (INDEPENDENT_AMBULATORY_CARE_PROVIDER_SITE_OTHER): Payer: Medicare Other

## 2014-07-25 VITALS — BP 130/50 | HR 69 | Temp 97.9°F | Ht 61.0 in | Wt 154.1 lb

## 2014-07-25 DIAGNOSIS — M25572 Pain in left ankle and joints of left foot: Secondary | ICD-10-CM

## 2014-07-25 DIAGNOSIS — R609 Edema, unspecified: Secondary | ICD-10-CM

## 2014-07-25 DIAGNOSIS — I10 Essential (primary) hypertension: Secondary | ICD-10-CM | POA: Diagnosis not present

## 2014-07-25 LAB — POCT CBC
Granulocyte percent: 66.3 %G (ref 37–80)
HCT, POC: 38.8 % — AB (ref 43.5–53.7)
HEMOGLOBIN: 12.4 g/dL — AB (ref 14.1–18.1)
LYMPH, POC: 2.3 (ref 0.6–3.4)
MCH: 29.3 pg (ref 27–31.2)
MCHC: 32.1 g/dL (ref 31.8–35.4)
MCV: 91.4 fL (ref 80–97)
MID (cbc): 0.7 (ref 0–0.9)
MPV: 6.6 fL (ref 0–99.8)
POC GRANULOCYTE: 5.8 (ref 2–6.9)
POC LYMPH %: 26 % (ref 10–50)
POC MID %: 7.7 %M (ref 0–12)
Platelet Count, POC: 197 10*3/uL (ref 142–424)
RBC: 4.25 M/uL — AB (ref 4.69–6.13)
RDW, POC: 15.3 %
WBC: 8.7 10*3/uL (ref 4.6–10.2)

## 2014-07-25 LAB — BASIC METABOLIC PANEL
BUN: 33 mg/dL — AB (ref 6–23)
CALCIUM: 9 mg/dL (ref 8.4–10.5)
CO2: 26 mEq/L (ref 19–32)
CREATININE: 1.37 mg/dL — AB (ref 0.50–1.35)
Chloride: 104 mEq/L (ref 96–112)
Glucose, Bld: 100 mg/dL — ABNORMAL HIGH (ref 70–99)
Potassium: 4.6 mEq/L (ref 3.5–5.3)
SODIUM: 139 meq/L (ref 135–145)

## 2014-07-25 LAB — URIC ACID: Uric Acid, Serum: 6.6 mg/dL (ref 4.0–7.8)

## 2014-07-25 MED ORDER — DOXYCYCLINE HYCLATE 100 MG PO TABS: 100.0000 mg | ORAL_TABLET | Freq: Two times a day (BID) | ORAL | Status: DC

## 2014-07-25 NOTE — Patient Instructions (Signed)

## 2014-07-25 NOTE — Progress Notes (Signed)
Subjective:    Patient ID: Stephen Door., male    DOB: January 05, 1931, 81 y.o.   MRN: [id number] This chart was scribed for Arlyss Queen, MD by Irene Pap, ED Scribe. This patient was seen in room 6 and patient care was started at 2:33 PM.   HPI HPI Comments: Stephen Gum. is a 81 y.o. male who presents to the [facility] complaining of a swollen left ankle onset 3 days ago. He states that he was unable to walk on it to try to reduce the swelling but to no relief. He states that he works outside a lot but does not remember any injuries from such work. He states that he woke up with the leg swelling. His daughter states that he has been wearing diabetic socks for the swelling and elevating his legs at night when he sleeps to no relief. His daughter states that he has a history of HTN and was seen last Sunday for the BP and heart rate, but was able to have it stabilized by Monday morning. He states that he has not seen his PCP in 3 months. He states that he is seeing Dr. Linna Darner at [facility] on Monday and Dr. Annamaria Boots. His daughter reports a history of quadruple bypass, angina, and congestive heart failure. He is currently on BP and heart medication.   Review of Systems  Constitutional: Negative for fever and chills.  Cardiovascular: Positive for leg swelling.  Musculoskeletal: Negative for myalgias and joint swelling.  Skin: Negative for color change and wound.  Neurological: Negative for weakness and numbness.      Objective:   Physical Exam  Constitutional: He appears well-developed and well-nourished. No distress.  HENT:  Head: Normocephalic and atraumatic.  Eyes: Conjunctivae are normal. Right eye exhibits no discharge. Left eye exhibits no discharge.  Neck: Neck supple.  Cardiovascular: Normal rate and normal heart sounds.  Exam reveals no gallop and no friction rub.   No murmur heard. 2/6 systolic murmur with a slight rhythm irregularity consistent with his  atrial fib with controlled rate  Pulmonary/Chest: Effort normal and breath sounds normal. No respiratory distress.  rhonci in both lungs.    Abdominal: Soft. He exhibits no distension. There is no tenderness.  Musculoskeletal: He exhibits no edema or tenderness.  2+ swelling on lower extremities bilaterally, left worse than right  Neurological: He is alert.  Skin: Skin is warm and dry.  He does have normal filling and circulation to the toes there is some redness around the needle ankle and foot  Psychiatric: He has a normal mood and affect. His behavior is normal. Thought content normal.  Nursing note and vitals reviewed.  Results for orders placed or performed in visit on 07/25/14  POCT CBC  Result Value Ref Range   WBC 8.7 4.6 - 10.2 K/uL   Lymph, poc 2.3 0.6 - 3.4   POC LYMPH PERCENT 26.0 10 - 50 %L   MID (cbc) 0.7 0 - 0.9   POC MID % 7.7 0 - 12 %M   POC Granulocyte 5.8 2 - 6.9   Granulocyte percent 66.3 37 - 80 %G   RBC 4.25 (A) 4.69 - 6.13 M/uL   Hemoglobin 12.4 (A) 14.1 - 18.1 g/dL   HCT, POC 38.8 (A) 43.5 - 53.7 %   MCV 91.4 80 - 97 fL   MCH, POC 29.3 27 - 31.2 pg   MCHC 32.1 31.8 - 35.4 g/dL   RDW, POC  15.3 %   Platelet Count, POC 197 142 - 424 K/uL   MPV 6.6 0 - 99.8 fL   [facility] reading (PRIMARY) by  Dr.Ricahrd Schwager no fracture seen       Assessment & Plan:  I suspect patient swelling is related to the medications he is on. He is on a calcium channel blocker. He's currently on Demadex 60 mg a day. He has a Lasix allergy. He is a already on an anticoagulant.Burnis Medin go ahead with a Doppler tomorrow I advised him to wear support stockings. I advised him to be careful about his salt intake. I did not change his medications. He has a follow-up visit with his PCP on Monday.Samule Ohm is some redness of the foot and ankle. We'll cover with doxycycline for 7 daysI personally performed the services described in this documentation, which was scribed in my presence. The recorded information  has been reviewed and is accurate.

## 2014-07-26 ENCOUNTER — Ambulatory Visit (HOSPITAL_COMMUNITY)
Admission: RE | Admit: 2014-07-26 | Discharge: 2014-07-26 | Disposition: A | Discharge status: Home / Self Care | Payer: Medicare Other | Source: Ambulatory Visit | Attending: Emergency Medicine | Admitting: Emergency Medicine

## 2014-07-26 ENCOUNTER — Telehealth: Payer: Self-pay | Admitting: Radiology

## 2014-07-26 ENCOUNTER — Ambulatory Visit (HOSPITAL_COMMUNITY): Payer: Medicare Other

## 2014-07-26 DIAGNOSIS — M7989 Other specified soft tissue disorders: Secondary | ICD-10-CM | POA: Diagnosis not present

## 2014-07-26 DIAGNOSIS — M25572 Pain in left ankle and joints of left foot: Secondary | ICD-10-CM

## 2014-07-26 DIAGNOSIS — R609 Edema, unspecified: Secondary | ICD-10-CM

## 2014-07-26 DIAGNOSIS — I10 Essential (primary) hypertension: Secondary | ICD-10-CM

## 2014-07-26 NOTE — Addendum Note (Signed)
Addended by: Constance Goltz on: 07/26/2014 08:33 AM   Modules accepted: Orders

## 2014-07-26 NOTE — Telephone Encounter (Signed)
Daughter called back. Cipro 500mg  BID, took last one yesterday-has not taken any doxycycline. Worried about Cipro causing inflammation in the foot. Dr. Everlene Farrier advised her to have pt not take any more Cipro, to not start Doxy and to f/u with his doctor tomorrow.

## 2014-07-26 NOTE — Telephone Encounter (Signed)
I took a call report from [facility] /patients doppler study is neg for DVT. His daughter is advised, and he is instructed to follow up with his primary care physician tomorrow. Dr Everlene Farrier did speak to his daughter regarding the doppler study. His kidney function has decreased some/ so we will not give him increased diuretics. Patient was given doxycycline today, daughter states she will call back to let us know what antibiotic he has been taking. Patient has been on antibiotics for past month, but she is not sure what he is taking. Daughter will call back and ask for you to let you know what medication he has been taking.

## 2014-07-27 ENCOUNTER — Ambulatory Visit (INDEPENDENT_AMBULATORY_CARE_PROVIDER_SITE_OTHER): Payer: Medicare Other | Admitting: Internal Medicine

## 2014-07-27 ENCOUNTER — Encounter: Payer: Self-pay | Admitting: Internal Medicine

## 2014-07-27 ENCOUNTER — Other Ambulatory Visit (INDEPENDENT_AMBULATORY_CARE_PROVIDER_SITE_OTHER): Payer: Medicare Other

## 2014-07-27 VITALS — BP 116/70 | HR 68 | Temp 98.3°F | Ht 62.0 in | Wt 152.0 lb

## 2014-07-27 DIAGNOSIS — R609 Edema, unspecified: Secondary | ICD-10-CM | POA: Insufficient documentation

## 2014-07-27 DIAGNOSIS — D509 Iron deficiency anemia, unspecified: Secondary | ICD-10-CM

## 2014-07-27 LAB — IBC PANEL
IRON: 61 ug/dL (ref 42–165)
Saturation Ratios: 20.8 % (ref 20.0–50.0)
TRANSFERRIN: 209 mg/dL — AB (ref 212.0–360.0)

## 2014-07-27 LAB — FERRITIN: FERRITIN: 106.5 ng/mL (ref 22.0–322.0)

## 2014-07-27 NOTE — Progress Notes (Signed)
Subjective:    Patient ID: [name]., male    DOB: 1930/09/06, 81 y.o.   MRN: [id number]  HPI He was hospitalized 4/10 for hypertensive urgency; despite being on 6 antihypertensive medications.He was found to be hypokalemic with K+ of 3.3; this was corrected.   He was discharged 4/11. Since discharge he has been compliant with his medications without adverse effects. Blood pressure has been responsive to hydralazine tid prn.  He does restrict sodium  He is going to [employer] 3 X/ week.despite his COPD.  He was seen in the urgent care 4/16 for edema of the ankle present since 4/13 despite Demadex 60 mg daily. There had been no specific injury or trigger for the edema; it was present upon awakening. Sodium restriction and support hose were recommended. No changes made in his medications. He was prescribed doxycycline for possible associated cellulitis.  Those labs were reviewed. White count was 8700 ;there was a mild anemia with hemoglobin 12.4 and hematocrit 38.8. Creatinine was 1.37 up from 0.90 in hospital.K+ was 4.6. Films of foot revealed no fracture.  Venous Doppler was negative for deep venous thrombosis.  He previously had been  placed on iron for iron deficiency anemia. His last GI evaluation was colonoscopy in 2009. That study was negative. He had been hospitalized with recurrent aspiration pneumonia. He denies any active GI symptoms or bleeding dyscrasias despite Eliquis.   Review of Systems Chest pain, palpitations, tachycardia, exertional dyspnea, paroxysmal nocturnal dyspnea,or claudication are absent.  Epistaxis, hemoptysis, hematuria, melena, or rectal bleeding denied. No unexplained weight loss, significant dyspepsia,dysphagia, or abdominal pain.  There is no abnormal bruising , bleeding, or difficulty stopping bleeding with injury.      Objective:   Physical Exam Pertinent or positive findings include: He is wearing the upper plate but not the  lower.  He has a beard and mustache. He has bilateral hearing aids.  He has a grade 1 systolic murmur. Heart sounds are somewhat distant.  He has loud bilateral carotid bruits. He has faint rales greater on the right than the left posteriorly. There is accentuation of the upper thoracic spine.  He has  1+ pitting edema of the left lower extremity below the knee.  He has trace edema on the right. Toenails are deformed.  He has a 2 x 2 centimeter irregular growth over the PIP joint of the left index finger.  General appearance :adequately nourished; in no distress. Eyes: No conjunctival inflammation or scleral icterus is present. Oral exam:  Lips and gums are healthy appearing.There is no oropharyngeal erythema or exudate noted.  Heart:  Normal rate and regular rhythm. S1 and S2 normal without gallop, click, rub or other extra sounds   Lungs:No increased work of breathing.  Abdomen: bowel sounds normal, soft and non-tender without masses, organomegaly or hernias noted.  No guarding or rebound.  Vascular : all pulses equal  Skin:Warm & dry.  Intact without suspicious lesions or rashes ; no tenting or jaundice  Lymphatic: No lymphadenopathy is noted about the head, neck, axilla Neuro: Strength, tone normal.        Assessment & Plan:  #1 unilateral edema most likely related to venous insufficiency in the context of remote injury to the left lower extremity  #2 anemia, on iron replacement orally  Plan: The pathophysiology of venous insufficiency was discussed with him and his wife. It was recommended that he wear the support hose to control edema.  The iron will be discontinued  and a CBC and differential checked in 8-12 weeks. Of primary concern is not to miss a gastrointestinal source of bleeding.

## 2014-07-27 NOTE — Progress Notes (Signed)
Pre visit review using our clinic review tool, if applicable. No additional management support is needed unless otherwise documented below in the visit note. 

## 2014-07-27 NOTE — Assessment & Plan Note (Signed)
Ferritin and iron levels will be checked as a prelude to stopping oral iron. Serial monitor of CBC after 8-12 weeks off the iron supplement

## 2014-07-27 NOTE — Patient Instructions (Signed)
Wear over the calf support hose if you are standing for prolonged periods of  time or working on your feet for prolonged periods of time to prevent the swelling.   Your next office appointment will be determined based upon review of your pending labs Those instructions will be transmitted to you by mail for your records.  Critical results will be called.   Followup as needed for any active or acute issue. Please report any significant change in your symptoms.

## 2014-07-27 NOTE — Assessment & Plan Note (Signed)
Support hose!

## 2014-07-28 ENCOUNTER — Other Ambulatory Visit: Payer: Self-pay | Admitting: Internal Medicine

## 2014-07-28 ENCOUNTER — Ambulatory Visit (INDEPENDENT_AMBULATORY_CARE_PROVIDER_SITE_OTHER): Payer: Medicare Other | Admitting: Cardiology

## 2014-07-28 ENCOUNTER — Encounter: Payer: Self-pay | Admitting: Cardiology

## 2014-07-28 VITALS — BP 126/60 | HR 76 | Ht 62.0 in | Wt 155.0 lb

## 2014-07-28 DIAGNOSIS — I2510 Atherosclerotic heart disease of native coronary artery without angina pectoris: Secondary | ICD-10-CM

## 2014-07-28 DIAGNOSIS — D509 Iron deficiency anemia, unspecified: Secondary | ICD-10-CM

## 2014-07-28 NOTE — Progress Notes (Signed)
HPI The patient presents for follow up of  CAD, s/p CABG, s/p Promus DES to S-PDA in 06/2010.   He has also had atrial fib AFib. He returns after having been hospitalized for hypertensive urgency. I reviewed these records.  He did have his hydralazine dose increased. Since then he's done OK although he's had problems with leg swelling and multiple visits with this. I did see that he had a Doppler done which demonstrated no DVT. He's been treated with a compression stocking. He's having some anxiety per his wife. However, his blood pressure is well controlled. He's not having any further presyncope or syncope. He's having no new chest pressure, neck or arm discomfort.   Allergies  Allergen Reactions  . Ace Inhibitors Other (See Comments)    Severe asthma (COPD)  . Other Other (See Comments)    Maple trees, allergy symptoms  . Codeine Nausea And Vomiting  . Lasix [Furosemide] Itching    Current Outpatient Prescriptions  Medication Sig Dispense Refill  . albuterol (PROVENTIL HFA;VENTOLIN HFA) 108 (90 BASE) MCG/ACT inhaler Inhale 2 puffs into the lungs every 6 (six) hours as needed for wheezing or shortness of breath. 8 g 5  . apixaban (ELIQUIS) 5 MG TABS tablet Take 1 tablet (5 mg total) by mouth 2 (two) times daily. 60 tablet 6  . budesonide (PULMICORT) 0.25 MG/2ML nebulizer solution Take 0.25 mg by nebulization at bedtime. For asthma    . calcium-vitamin D (OSCAL WITH D) 500-200 MG-UNIT per tablet Take 1 tablet by mouth 2 (two) times daily.    . cholestyramine (QUESTRAN) 4 G packet Take 1 packet (4 g total) by mouth 2 (two) times daily as needed. (Patient taking differently: Take 4 g by mouth daily at 6 PM. ) 60 each 2  . cloNIDine (CATAPRES) 0.3 MG tablet Take 1 tablet (0.3 mg total) by mouth 2 (two) times daily. 60 tablet 11  . denosumab (PROLIA) 60 MG/ML SOLN injection Inject 60 mg into the skin every 6 (six) months. Administer in upper arm, thigh, or abdomen 1 mL 5  . diltiazem (CARDIZEM  CD) 120 MG 24 hr capsule Take 1 capsule (120 mg total) by mouth 2 (two) times daily. 60 capsule 5  . DULERA 100-5 MCG/ACT AERO inhale 2 puffs by mouth once daily (THEN RINSE MOUTH AFTER USE) 17.6 g 5  . ferrous sulfate 325 (65 FE) MG tablet take 1 tablet by mouth three times a day with food 90 tablet 3  . hydrALAZINE (APRESOLINE) 50 MG tablet Take 1.5 tablets (75 mg total) by mouth 3 (three) times daily. 90 tablet 5  . ipratropium-albuterol (DUONEB) 0.5-2.5 (3) MG/3ML SOLN Take 3 mLs by nebulization every 6 (six) hours as needed (for shortness of breath). (Patient taking differently: Take 3 mLs by nebulization at bedtime. ) 360 mL 6  . isosorbide mononitrate (IMDUR) 60 MG 24 hr tablet Take 1 tablet (60 mg total) by mouth daily. 90 tablet 3  . latanoprost (XALATAN) 0.005 % ophthalmic solution Place 1 drop into both eyes at bedtime.     Marland Kitchen LORazepam (ATIVAN) 0.5 MG tablet Take 1 tablet (0.5 mg total) by mouth 3 (three) times daily. 90 tablet 5  . losartan (COZAAR) 100 MG tablet Take 1 tablet (100 mg total) by mouth daily. (Patient taking differently: Take 100 mg by mouth every evening. ) 30 tablet 11  . LYSINE PO Take 1 tablet by mouth daily at 12 noon.    . montelukast (SINGULAIR) 10 MG tablet  Take 10 mg by mouth at bedtime.    . nebivolol (BYSTOLIC) 10 MG tablet Take 1 tablet (10 mg total) by mouth daily. 30 tablet 6  . nitroGLYCERIN (NITROSTAT) 0.4 MG SL tablet Place 1 tablet (0.4 mg total) under the tongue every 5 (five) minutes as needed for chest pain. 25 tablet 0  . pantoprazole (PROTONIX) 40 MG tablet take 1 tablet by mouth twice a day 60 tablet 0  . potassium chloride SA (K-DUR,KLOR-CON) 20 MEQ tablet take 1 tablet by mouth twice a day 60 tablet 1  . pravastatin (PRAVACHOL) 80 MG tablet Take 1 tablet (80 mg total) by mouth every evening. 30 tablet 3  . Probiotic Product (PHILLIPS COLON HEALTH PO) Take 1 capsule by mouth every evening.     Marland Kitchen RA VITAMIN B-12 TR 1000 MCG TBCR take 1 tablet by  mouth once daily 30 tablet 11  . torsemide (DEMADEX) 20 MG tablet Take 3 tablets (60 mg total) by mouth daily. (Patient taking differently: Take 20 mg by mouth 3 (three) times daily. ) 90 tablet 5   No current facility-administered medications for this visit.    Past Medical History  Diagnosis Date  . CORONARY HEART DISEASE     a. s/p CABG;  b.  cath 06/27/10: S-Dx occluded, S-PDA 80-90% (tx with PCI); S-OM ok, L-LAD ok;  EF 65-70%  c.  s/p Promus DES to S-PDA 06/2010;   d. Myoview 8/12: low risk  . FIBRILLATION, ATRIAL     post op; ?documented during hosp. 06/2010  . SLEEP APNEA 09/2001    NPSG AHI 22/HR  . ALLERGIC RHINITIS   . ASTHMA   . Depression   . GERD     with HH, hx esophageal stricture  . DYSLIPIDEMIA   . HYPERTENSION     Echo 3/12: EF 55-60%; mod LVH; mild AS/AI; LAE; PASP 38; mild pulmo HTN  . Personal history of alcoholism   . Diverticulosis   . Benign liver cyst   . Skin cancer     L forearm  . Cataract     surgery to both eyes  . Esophageal stricture   . Hiatal hernia   . Pneumonia 04/2013 hosp  . Shortness of breath   . Allergy   . Anxiety   . Arthritis   . CHF (congestive heart failure)   . COPD (chronic obstructive pulmonary disease)   . Glaucoma   . Osteoporosis   . History of heart attack     Past Surgical History  Procedure Laterality Date  . Coronary artery bypass graft  02/2007  . Tonsillectomy    . Coronary angioplasty with stent placement  07/2010  . Cataract extraction, bilateral  2007  . Hernia repair  unsure ?60's  . Cholecystectomy  02/21/2011    Procedure: LAPAROSCOPIC CHOLECYSTECTOMY WITH INTRAOPERATIVE CHOLANGIOGRAM;  Surgeon: Pedro Earls, MD;  Location: [facility];  Service: General;  Laterality: N/A;    ROS:  As stated in the HPI and negative for all other systems.  PHYSICAL EXAM BP 126/60 mmHg  Pulse 76  Ht 5\' 2"  (1.575 m)  Wt 155 lb (70.308 kg)  BMI 28.34 kg/m2 GENERAL:  Chronically ill appearing but in no acute  distress HEENT:  Pupils equal round and reactive, fundi not visualized, oral mucosa unremarkable, dentures NECK:  No jugular venous distention, waveform within normal limits, carotid upstroke brisk and symmetric, left carotid bruits, no thyromegaly LUNGS:  Clear to auscultation bilaterally CHEST:  Well healed sternotomy scar.  HEART:  PMI not displaced or sustained,S1 and S2 within normal limits, no S3, no S4, no clicks, no rubs, soft apical, right upper sterna border early peaking murmur ABD:  Flat, positive bowel sounds normal in frequency in pitch, no bruits, no rebound, no guarding, no midline pulsatile mass, no hepatomegaly, no splenomegaly, healed abdominal scars. EXT:  2 plus pulses throughout, mild bilateral edema, no cyanosis no clubbing SKIN:  No nodules   ASSESSMENT AND PLAN  CAD:  The patient has no new sypmtoms.  No further cardiovascular testing is indicated.  We will continue with aggressive risk reduction and meds as listed.  ATRIAL FIBRILLATION:    He will continue with the meds as listed.   He has no Paroxysmal symptoms.  Of note there was some mention of ventricular tachycardia nonsustained in the hospital but I could find no strips of this.  HTN: His blood pressure is now at target.  We had a long discussion about this and he will take 25 mg of when necessary hydralazine and a lorazepam if his blood pressure goes up.  CAROTID STENOSIS:  I reviewed the results from last year he had 40-59% bilateral stenosis.  He will have follow up in next year.   Hospital records reviewed.

## 2014-07-28 NOTE — Patient Instructions (Signed)
Your physician recommends that you schedule a follow-up appointment in: 2 months  

## 2014-07-30 ENCOUNTER — Other Ambulatory Visit: Payer: Self-pay | Admitting: Cardiology

## 2014-08-10 ENCOUNTER — Other Ambulatory Visit: Payer: Self-pay | Admitting: Cardiology

## 2014-08-12 DIAGNOSIS — H401213 Low-tension glaucoma, right eye, severe stage: Secondary | ICD-10-CM | POA: Diagnosis not present

## 2014-08-12 DIAGNOSIS — H26493 Other secondary cataract, bilateral: Secondary | ICD-10-CM | POA: Diagnosis not present

## 2014-08-12 DIAGNOSIS — H3531 Nonexudative age-related macular degeneration: Secondary | ICD-10-CM | POA: Diagnosis not present

## 2014-08-12 DIAGNOSIS — H401221 Low-tension glaucoma, left eye, mild stage: Secondary | ICD-10-CM | POA: Diagnosis not present

## 2014-08-17 DIAGNOSIS — E785 Hyperlipidemia, unspecified: Secondary | ICD-10-CM | POA: Diagnosis not present

## 2014-08-17 DIAGNOSIS — I2510 Atherosclerotic heart disease of native coronary artery without angina pectoris: Secondary | ICD-10-CM | POA: Diagnosis not present

## 2014-08-17 DIAGNOSIS — I10 Essential (primary) hypertension: Secondary | ICD-10-CM | POA: Diagnosis not present

## 2014-08-17 DIAGNOSIS — R112 Nausea with vomiting, unspecified: Secondary | ICD-10-CM | POA: Diagnosis not present

## 2014-08-17 DIAGNOSIS — R6883 Chills (without fever): Secondary | ICD-10-CM | POA: Diagnosis not present

## 2014-08-17 DIAGNOSIS — I509 Heart failure, unspecified: Secondary | ICD-10-CM | POA: Diagnosis not present

## 2014-08-17 DIAGNOSIS — R197 Diarrhea, unspecified: Secondary | ICD-10-CM | POA: Diagnosis not present

## 2014-08-17 DIAGNOSIS — Z87891 Personal history of nicotine dependence: Secondary | ICD-10-CM | POA: Diagnosis not present

## 2014-08-17 DIAGNOSIS — Z951 Presence of aortocoronary bypass graft: Secondary | ICD-10-CM | POA: Diagnosis not present

## 2014-08-17 DIAGNOSIS — J45909 Unspecified asthma, uncomplicated: Secondary | ICD-10-CM | POA: Diagnosis not present

## 2014-08-18 DIAGNOSIS — R6883 Chills (without fever): Secondary | ICD-10-CM | POA: Diagnosis not present

## 2014-08-18 DIAGNOSIS — R197 Diarrhea, unspecified: Secondary | ICD-10-CM | POA: Diagnosis not present

## 2014-08-18 DIAGNOSIS — R112 Nausea with vomiting, unspecified: Secondary | ICD-10-CM | POA: Diagnosis not present

## 2014-08-18 DIAGNOSIS — I10 Essential (primary) hypertension: Secondary | ICD-10-CM | POA: Diagnosis not present

## 2014-08-24 ENCOUNTER — Ambulatory Visit: Payer: Medicare Other | Admitting: Internal Medicine

## 2014-08-26 ENCOUNTER — Other Ambulatory Visit: Payer: Self-pay | Admitting: Cardiology

## 2014-08-31 ENCOUNTER — Ambulatory Visit (INDEPENDENT_AMBULATORY_CARE_PROVIDER_SITE_OTHER): Payer: Medicare Other | Admitting: Cardiology

## 2014-08-31 ENCOUNTER — Encounter: Payer: Self-pay | Admitting: Cardiology

## 2014-08-31 VITALS — BP 160/64 | HR 68 | Ht 62.0 in | Wt 157.8 lb

## 2014-08-31 DIAGNOSIS — I10 Essential (primary) hypertension: Secondary | ICD-10-CM | POA: Diagnosis not present

## 2014-08-31 DIAGNOSIS — Z951 Presence of aortocoronary bypass graft: Secondary | ICD-10-CM

## 2014-08-31 DIAGNOSIS — F418 Other specified anxiety disorders: Secondary | ICD-10-CM

## 2014-08-31 DIAGNOSIS — R011 Cardiac murmur, unspecified: Secondary | ICD-10-CM | POA: Diagnosis not present

## 2014-08-31 DIAGNOSIS — I2510 Atherosclerotic heart disease of native coronary artery without angina pectoris: Secondary | ICD-10-CM | POA: Diagnosis not present

## 2014-08-31 DIAGNOSIS — I739 Peripheral vascular disease, unspecified: Secondary | ICD-10-CM | POA: Insufficient documentation

## 2014-08-31 DIAGNOSIS — J449 Chronic obstructive pulmonary disease, unspecified: Secondary | ICD-10-CM

## 2014-08-31 DIAGNOSIS — Z9861 Coronary angioplasty status: Secondary | ICD-10-CM

## 2014-08-31 MED ORDER — HYDROXYZINE HCL 25 MG PO TABS: 25.0000 mg | ORAL_TABLET | Freq: Three times a day (TID) | ORAL | Status: DC | PRN

## 2014-08-31 NOTE — Assessment & Plan Note (Signed)
moderate obstructive airways disease

## 2014-08-31 NOTE — Assessment & Plan Note (Signed)
Moderate carotid disease

## 2014-08-31 NOTE — Assessment & Plan Note (Signed)
NSR on exam 

## 2014-08-31 NOTE — Assessment & Plan Note (Signed)
No angina 

## 2014-08-31 NOTE — Progress Notes (Signed)
08/31/2014 [place].   [birth year]  [id number]  Primary Physician Gwendolyn Grant, MD Primary Cardiologist: Dr Percival Spanish  HPI:  81 y/o male followed by Dr Percival Spanish with a history of CAD, PVD, COPD, and uncontrolled HTN despite multiple medications. The pt si seen as an add on, its the first time I have seen him. He and his wife are very frustrated with their care "the doctors just don't listen to [place]". He saw Dr Percival Spanish in April and was stable but has since been admitted to the ED in [place] with accelerated HTN.  He says his B/P will go from 536 systolic to 144 systolic. When his B/P is elevated he just doesnt "feel right". He apparently has had several trips to the ED for HTN. He and his wife don't understand what the problem is or why the doctors can't figure it out. The pt's wife had a list of other medical concerns including anemia, skin lesions, and trouble sleeping.    Current Outpatient Prescriptions  Medication Sig Dispense Refill  . albuterol (PROVENTIL HFA;VENTOLIN HFA) 108 (90 BASE) MCG/ACT inhaler Inhale 2 puffs into the lungs every 6 (six) hours as needed for wheezing or shortness of breath. 8 g 5  . budesonide (PULMICORT) 0.25 MG/2ML nebulizer solution Take 0.25 mg by nebulization at bedtime. For asthma    . calcium-vitamin D (OSCAL WITH D) 500-200 MG-UNIT per tablet Take 1 tablet by mouth 2 (two) times daily.    . cholestyramine (QUESTRAN) 4 G packet Take 1 packet (4 g total) by mouth 2 (two) times daily as needed. (Patient taking differently: Take 4 g by mouth daily at 6 PM. ) 60 each 2  . cloNIDine (CATAPRES) 0.3 MG tablet Take 1 tablet (0.3 mg total) by mouth 2 (two) times daily. 60 tablet 11  . denosumab (PROLIA) 60 MG/ML SOLN injection Inject 60 mg into the skin every 6 (six) months. Administer in upper arm, thigh, or abdomen 1 mL 5  . diltiazem (CARDIZEM CD) 120 MG 24 hr capsule Take 1 capsule (120 mg total) by mouth 2 (two) times daily. 60 capsule 5  . DULERA  100-5 MCG/ACT AERO inhale 2 puffs by mouth once daily (THEN RINSE MOUTH AFTER USE) 17.6 g 5  . ELIQUIS 5 MG TABS tablet take 1 tablet by mouth twice a day 60 tablet 5  . ferrous sulfate 325 (65 FE) MG tablet take 1 tablet by mouth three times a day with food 90 tablet 3  . hydrALAZINE (APRESOLINE) 50 MG tablet Take 1.5 tablets (75 mg total) by mouth 3 (three) times daily. 90 tablet 5  . ipratropium-albuterol (DUONEB) 0.5-2.5 (3) MG/3ML SOLN Take 3 mLs by nebulization every 6 (six) hours as needed (for shortness of breath). (Patient taking differently: Take 3 mLs by nebulization at bedtime. ) 360 mL 6  . isosorbide mononitrate (IMDUR) 60 MG 24 hr tablet Take 1 tablet (60 mg total) by mouth daily. 90 tablet 3  . latanoprost (XALATAN) 0.005 % ophthalmic solution Place 1 drop into both eyes at bedtime.     Marland Kitchen LORazepam (ATIVAN) 0.5 MG tablet Take 1 tablet (0.5 mg total) by mouth 3 (three) times daily. 90 tablet 5  . losartan (COZAAR) 100 MG tablet Take 1 tablet (100 mg total) by mouth daily. (Patient taking differently: Take 100 mg by mouth every evening. ) 30 tablet 11  . LYSINE PO Take 1 tablet by mouth daily at 12 noon.    . montelukast (SINGULAIR) 10  MG tablet Take 10 mg by mouth at bedtime.    . nebivolol (BYSTOLIC) 10 MG tablet Take 1 tablet (10 mg total) by mouth daily. 30 tablet 6  . nitroGLYCERIN (NITROSTAT) 0.4 MG SL tablet Place 1 tablet (0.4 mg total) under the tongue every 5 (five) minutes as needed for chest pain. 25 tablet 0  . pantoprazole (PROTONIX) 40 MG tablet take 1 tablet by mouth twice a day 60 tablet 0  . potassium chloride SA (K-DUR,KLOR-CON) 20 MEQ tablet take 1 tablet by mouth twice a day 60 tablet 1  . pravastatin (PRAVACHOL) 80 MG tablet take 1 tablet by mouth every evening 30 tablet 2  . Probiotic Product (PHILLIPS COLON HEALTH PO) Take 1 capsule by mouth every evening.     Marland Kitchen RA VITAMIN B-12 TR 1000 MCG TBCR take 1 tablet by mouth once daily 30 tablet 11  . torsemide  (DEMADEX) 20 MG tablet Take 3 tablets (60 mg total) by mouth daily. (Patient taking differently: Take 20 mg by mouth 3 (three) times daily. ) 90 tablet 5   No current facility-administered medications for this visit.    Allergies  Allergen Reactions  . Ace Inhibitors Other (See Comments)    Severe asthma (COPD)  . Other Other (See Comments)    Maple trees, allergy symptoms  . Codeine Nausea And Vomiting  . Lasix [Furosemide] Itching    History   Social History  . Marital Status: Married    Spouse Name: N/A  . Number of Children: 2  . Years of Education: N/A   Occupational History  . retired     67 years   Social History Main Topics  . Smoking status: Former Smoker -- 3.00 packs/day for 40 years    Types: Cigarettes    Quit date: 04/11/1979  . Smokeless tobacco: Never Used  . Alcohol Use: No     Comment: quit drinking 40+ years  . Drug Use: No  . Sexual Activity: Yes   Other Topics Concern  . 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 Married lives with wife 2 children     Review of Systems: General: negative for chills, fever, night sweats or weight changes.  Cardiovascular: negative for chest pain, dyspnea on exertion, edema, orthopnea, palpitations, paroxysmal nocturnal dyspnea or shortness of breath Dermatological: negative for rash Respiratory: negative for cough or wheezing Urologic: negative for hematuria Abdominal: negative for nausea, vomiting, diarrhea, bright red blood per rectum, melena, or hematemesis Neurologic: negative for visual changes, syncope, or dizziness All other systems reviewed and are otherwise negative except as noted above.    Blood pressure 160/64, pulse 68, height 5\' 2"  (1.575 m), weight 157 lb 12.8 oz (71.578 kg).  General appearance: alert, cooperative, no distress and anxious Neck: no JVD and bruit Lungs: clear to auscultation bilaterally Heart: regular rate and rhythm Extremities: no  edema Skin: multiple areas of bruising and small skin tears where he has been scratching Neurologic: Grossly normal, anxious, NAD  EKG NSR  ASSESSMENT AND PLAN:   Uncontrolled hypertension The pt and his wife are here because the pt's B/P has been uncontrollable.   PAF (paroxysmal atrial fibrillation) NSR on exam   Hx of CABG 2008 .   CAD S/P S-PDA DES 2012 No angina   PVD (peripheral vascular disease) Moderate carotid disease   Asthma with COPD  moderate obstructive airways disease   Murmur MR murmur on exam, previous echo 2014 no significant  MR   Anxiety about health Frustrated, unhappy with his care, multiple questions about several medical issues today.   PLAN  I do not see where he has had a renal doppler done and will order that. He also has MR on exam and previous echo did show MR so I ordered an echo as well. The pt and his wife basically would not unless I gave them something for itching. I tried to defer this to their PCP but they insisted. Apparently he was told he can't take Benadryl secondary to drug interaction (?).  I gave him an Rx of Atarax 25 mg TID prn.  F/U with Dr Percival Spanish  After the above studies. They asked about a referral to a neprhologist for his HTN and I told them this may be an option depending his doppler studies.   Kerin Ransom K PA-C 08/31/2014 4:19 PM

## 2014-08-31 NOTE — Assessment & Plan Note (Signed)
MR murmur on exam, previous echo 2014 no significant MR

## 2014-08-31 NOTE — Patient Instructions (Addendum)
A prescription has been sent in for Atarax 25mg  three times a day for itching.  DO NOT TAKE YOUR ANTI-ANXIETY MEDICATION WITH THIS(LORAZEPAM/ATIVAN).  IF YOU STILL HAVE ITCHING AFTER THESE ARE GONE PLEASE FOLLOW UP WITH YOUR PRIMARY CARE DOCTOR.  Your physician has requested that you have a renal artery duplex. During this test, an ultrasound is used to evaluate blood flow to the kidneys. Allow one hour for this exam. Do not eat after midnight the day before and avoid carbonated beverages. Take your medications as you usually do.  Your physician has requested that you have an echocardiogram. Echocardiography is a painless test that uses sound waves to create images of your heart. It provides your doctor with information about the size and shape of your heart and how well your heart's chambers and valves are working. This procedure takes approximately one hour. There are no restrictions for this procedure.  Your physician recommends that you schedule a follow-up appointment in: AUGUST with Dr. Percival Spanish.

## 2014-08-31 NOTE — Assessment & Plan Note (Signed)
The pt and his wife are here because the pt's B/P has been uncontrollable.

## 2014-08-31 NOTE — Assessment & Plan Note (Signed)
Frustrated, unhappy with his care, multiple questions about several medical issues today.

## 2014-09-02 NOTE — Addendum Note (Signed)
Addended by: Ulice Brilliant T on: 09/02/2014 04:34 PM   Modules accepted: Orders

## 2014-09-08 ENCOUNTER — Other Ambulatory Visit: Payer: Self-pay | Admitting: Internal Medicine

## 2014-09-09 ENCOUNTER — Other Ambulatory Visit: Payer: Self-pay | Admitting: Internal Medicine

## 2014-09-09 ENCOUNTER — Encounter: Payer: Self-pay | Admitting: Internal Medicine

## 2014-09-09 ENCOUNTER — Ambulatory Visit (INDEPENDENT_AMBULATORY_CARE_PROVIDER_SITE_OTHER): Payer: Medicare Other | Admitting: Internal Medicine

## 2014-09-09 VITALS — BP 128/80 | HR 61 | Temp 97.5°F | Ht 62.0 in | Wt 155.2 lb

## 2014-09-09 DIAGNOSIS — I10 Essential (primary) hypertension: Secondary | ICD-10-CM | POA: Diagnosis not present

## 2014-09-09 DIAGNOSIS — F411 Generalized anxiety disorder: Secondary | ICD-10-CM

## 2014-09-09 MED ORDER — TORSEMIDE 20 MG PO TABS: 20.0000 mg | ORAL_TABLET | Freq: Three times a day (TID) | ORAL | Status: DC

## 2014-09-09 MED ORDER — PREDNISONE 10 MG (21) PO TBPK: 10.0000 mg | ORAL_TABLET | Freq: Every day | ORAL | Status: DC

## 2014-09-09 MED ORDER — LOSARTAN POTASSIUM 100 MG PO TABS: 100.0000 mg | ORAL_TABLET | Freq: Every evening | ORAL | Status: DC

## 2014-09-09 MED ORDER — IPRATROPIUM-ALBUTEROL 0.5-2.5 (3) MG/3ML IN SOLN: 3.0000 mL | Freq: Every day | RESPIRATORY_TRACT | Status: DC

## 2014-09-09 NOTE — Progress Notes (Signed)
Pre visit review using our clinic review tool, if applicable. No additional management support is needed unless otherwise documented below in the visit note. 

## 2014-09-09 NOTE — Assessment & Plan Note (Signed)
Labile and uncontrolled for years Exacerbated by anxiety Also exacerbation with hosp for rapid A. Fib 02/2013: Amlodipine discontinued in place of diltiazem 04/2012 hosp for RVR Reviewed episode of bradycardia during 02/2013 hospitalization and cards visit 03/2013 - off Dig and decreaded Dilt because of same Bidil started hospitalization 06/2012 -titrated TID 02/2013 Because of lack of availability of Bidil, changed 04/2014 to 2 separate components: hydralazine and Imdur  also on Clonidine 0.2mg  twice daily, max ARB, Zebeta bid Stopped lasix because of itching -but tolerating torsemide -continue at current dosing patient and wife will monitor at home for mgmt of same -  educated on goal: 140s/80s - to check BP only 2x/day! Follow-up for renal Doppler as scheduled in June 2016 Last cardiology evaluation with M.D., then PA, reviewed Also addressed anxiety component - see next  BP Readings from Last 3 Encounters:  09/09/14 128/80  08/31/14 160/64  07/28/14 126/60

## 2014-09-09 NOTE — Progress Notes (Signed)
Subjective:    Patient ID: [name]., male    DOB: 1930-06-01, 81 y.o.   MRN: [id number]  HPI  Patient here for follow-up. Reviewed interval events and current concerns as well as chronic conditions. Continued issues with labile and extreme blood pressure management  Past Medical History  Diagnosis Date  . CORONARY HEART DISEASE     a. s/p CABG;  b.  cath 06/27/10: S-Dx occluded, S-PDA 80-90% (tx with PCI); S-OM ok, L-LAD ok;  EF 65-70%  c.  s/p Promus DES to S-PDA 06/2010;   d. Myoview 8/12: low risk  . FIBRILLATION, ATRIAL     post op; ?documented during hosp. 06/2010  . SLEEP APNEA 09/2001    NPSG AHI 22/HR  . ALLERGIC RHINITIS   . ASTHMA   . Depression   . GERD     with HH, hx esophageal stricture  . DYSLIPIDEMIA   . HYPERTENSION     Echo 3/12: EF 55-60%; mod LVH; mild AS/AI; LAE; PASP 38; mild pulmo HTN  . Personal history of alcoholism   . Diverticulosis   . Benign liver cyst   . Skin cancer     L forearm  . Cataract     surgery to both eyes  . Esophageal stricture   . Hiatal hernia   . Pneumonia 04/2013 hosp  . Shortness of breath   . Allergy   . Anxiety   . Arthritis   . CHF (congestive heart failure)   . COPD (chronic obstructive pulmonary disease)   . Glaucoma   . Osteoporosis   . History of heart attack     Review of Systems  Constitutional: Positive for fatigue. Negative for fever.  Cardiovascular: Negative for chest pain and leg swelling.  Skin:       Diffuse itch and dry skin  Neurological: Positive for weakness. Negative for tremors and light-headedness.       Objective:    Physical Exam  Constitutional: He appears well-developed and well-nourished. No distress.  Wife at side  Cardiovascular: Normal rate, regular rhythm and normal heart sounds.   No murmur heard. Pulmonary/Chest: Effort normal and breath sounds normal. No respiratory distress.  Skin:  Dry skin with evidence of excoriation on arms  Psychiatric:  Anxious    BP  128/80 mmHg  Pulse 61  Temp(Src) 97.5 F (36.4 C) (Oral)  Ht 5\' 2"  (1.575 m)  Wt 155 lb 4 oz (70.421 kg)  BMI 28.39 kg/m2  SpO2 95% Wt Readings from Last 3 Encounters:  09/09/14 155 lb 4 oz (70.421 kg)  08/31/14 157 lb 12.8 oz (71.578 kg)  07/28/14 155 lb (70.308 kg)     Lab Results  Component Value Date   WBC 8.7 07/25/2014   HGB 12.4* 07/25/2014   HCT 38.8* 07/25/2014   PLT 170 07/20/2014   GLUCOSE 100* 07/25/2014   CHOL 81 10/24/2013   TRIG 80.0 10/24/2013   HDL 27.80* 10/24/2013   LDLCALC 37 10/24/2013   ALT 12 06/19/2014   AST 13 06/19/2014   NA 139 07/25/2014   K 4.6 07/25/2014   CL 104 07/25/2014   CREATININE 1.37* 07/25/2014   BUN 33* 07/25/2014   CO2 26 07/25/2014   TSH 1.472 07/20/2014   PSA 0.62 10/10/2010   INR 1.19 05/04/2013   HGBA1C 6.4 10/16/2012    US Venous Img Lower Bilateral  07/26/2014   CLINICAL DATA:  Bilateral leg swelling for 1 year, left greater than right.  EXAM: BILATERAL  LOWER EXTREMITY VENOUS DOPPLER ULTRASOUND  TECHNIQUE: Gray-scale sonography with graded compression, as well as color Doppler and duplex ultrasound were performed to evaluate the lower extremity deep venous systems from the level of the common femoral vein and including the common femoral, femoral, profunda femoral, popliteal and calf veins including the posterior tibial, peroneal and gastrocnemius veins when visible. The superficial great saphenous vein was also interrogated. Spectral Doppler was utilized to evaluate flow at rest and with distal augmentation maneuvers in the common femoral, femoral and popliteal veins.  COMPARISON:  None.  FINDINGS: RIGHT LOWER EXTREMITY  Common Femoral Vein: No evidence of thrombus. Normal compressibility, respiratory phasicity and response to augmentation.  Saphenofemoral Junction: No evidence of thrombus. Normal compressibility and flow on color Doppler imaging.  Profunda Femoral Vein: No evidence of thrombus. Normal compressibility and flow  on color Doppler imaging.  Femoral Vein: No evidence of thrombus. Normal compressibility, respiratory phasicity and response to augmentation.  Popliteal Vein: No evidence of thrombus. Normal compressibility, respiratory phasicity and response to augmentation.  Calf Veins: No evidence of thrombus. Normal compressibility and flow on color Doppler imaging.  Superficial Great Saphenous Vein: No evidence of thrombus. Normal compressibility and flow on color Doppler imaging.  Venous Reflux:  None.  Other Findings:  5.5 x 1.2 x 1.4 cm popliteal/Baker's cyst is noted.  LEFT LOWER EXTREMITY  Common Femoral Vein: No evidence of thrombus. Normal compressibility, respiratory phasicity and response to augmentation.  Saphenofemoral Junction: No evidence of thrombus. Normal compressibility and flow on color Doppler imaging.  Profunda Femoral Vein: No evidence of thrombus. Normal compressibility and flow on color Doppler imaging.  Femoral Vein: No evidence of thrombus. Normal compressibility, respiratory phasicity and response to augmentation.  Popliteal Vein: No evidence of thrombus. Normal compressibility, respiratory phasicity and response to augmentation.  Calf Veins: No evidence of thrombus. Normal compressibility and flow on color Doppler imaging.  Superficial Great Saphenous Vein: No evidence of thrombus. Normal compressibility and flow on color Doppler imaging.  Venous Reflux:  None.  Other Findings:  None.  IMPRESSION: No evidence of deep venous thrombosis.   Electronically Signed   By: Marijo Sanes M.D.   On: 07/26/2014 13:37       Assessment & Plan:   Itch. 4 prednisone as needed. Encouraged to use hydrating lotion or cream after bathing with topical steroids as needed  Problem List Items Addressed This Visit    Generalized anxiety disorder    Chronic symptoms, exacerbated by fear of uncontrolled blood pressure and symptomatic dyspnea On scheduled benzodiazepine, titrated up to 3 times a day 08/2013 to help  blood pressure management Has been off benzodiazepine at direction of cardiology PA while taking Atarax for itch. As it is unimproved, instructed to resume benzodiazepine as prescribed. Will use prednisone as needed for itch after review of potential side effects and potential benefits profile Reminded of importance of compliance with scheduled benzodiazepine for optimal blood pressure control      Uncontrolled hypertension - Primary    Labile and uncontrolled for years Exacerbated by anxiety Also exacerbation with hosp for rapid A. Fib 02/2013: Amlodipine discontinued in place of diltiazem 04/2012 hosp for RVR Reviewed episode of bradycardia during 02/2013 hospitalization and cards visit 03/2013 - off Dig and decreaded Dilt because of same Bidil started hospitalization 06/2012 -titrated TID 02/2013 Because of lack of availability of Bidil, changed 04/2014 to 2 separate components: hydralazine and Imdur  also on Clonidine 0.2mg  twice daily, max ARB, Zebeta bid Stopped lasix because  of itching -but tolerating torsemide -continue at current dosing patient and wife will monitor at home for mgmt of same -  educated on goal: 140s/80s - to check BP only 2x/day! Follow-up for renal Doppler as scheduled in June 2016 Last cardiology evaluation with M.D., then PA, reviewed Also addressed anxiety component - see next  BP Readings from Last 3 Encounters:  09/09/14 128/80  08/31/14 160/64  07/28/14 126/60        Relevant Medications   torsemide (DEMADEX) 20 MG tablet   losartan (COZAAR) 100 MG tablet       Gwendolyn Grant, MD

## 2014-09-09 NOTE — Assessment & Plan Note (Signed)
Chronic symptoms, exacerbated by fear of uncontrolled blood pressure and symptomatic dyspnea On scheduled benzodiazepine, titrated up to 3 times a day 08/2013 to help blood pressure management Has been off benzodiazepine at direction of cardiology PA while taking Atarax for itch. As it is unimproved, instructed to resume benzodiazepine as prescribed. Will use prednisone as needed for itch after review of potential side effects and potential benefits profile Reminded of importance of compliance with scheduled benzodiazepine for optimal blood pressure control

## 2014-09-09 NOTE — Patient Instructions (Signed)
It was good to see you today.  We have reviewed your prior records including labs and tests today  Medications reviewed and updated,  Prednisone as needed -no changes recommended at this time.  Your prescription(s) have been submitted to your pharmacy. Please take as directed and contact our office if you believe you are having problem(s) with the medication(s).  Continue with tests in June as ordered  Please schedule followup in 4-6 weeks, call sooner if problems.

## 2014-09-11 ENCOUNTER — Ambulatory Visit (INDEPENDENT_AMBULATORY_CARE_PROVIDER_SITE_OTHER): Payer: Medicare Other | Admitting: Internal Medicine

## 2014-09-11 ENCOUNTER — Encounter: Payer: Self-pay | Admitting: Internal Medicine

## 2014-09-11 VITALS — BP 132/78 | HR 60 | Ht 62.0 in | Wt 150.6 lb

## 2014-09-11 DIAGNOSIS — G4733 Obstructive sleep apnea (adult) (pediatric): Secondary | ICD-10-CM

## 2014-09-11 DIAGNOSIS — J449 Chronic obstructive pulmonary disease, unspecified: Secondary | ICD-10-CM | POA: Diagnosis not present

## 2014-09-11 NOTE — Assessment & Plan Note (Signed)
Good compliance and control. Current pressures appropriate. Wife confirms he seems to be doing well.

## 2014-09-11 NOTE — Patient Instructions (Signed)
Ask Dr Katheren Puller guidance on your BP management.  I'm glad your breathing is doing so well currently.   We can continue allergy vaccine at 1:10 GO                                       And CPAP 12/ Huey Romans

## 2014-09-11 NOTE — Progress Notes (Signed)
Patient ID: [name], male    DOB: 1930/11/18, 81 y.o.   MRN: [id number]  HPI 11/16/10- 54 year old man followed for severe chronic asthma, sleep apnea, allergic rhinitis, complicated by HBP CAD history of atrial fibrillation, GERD Last here May 20, 2010 CPAP 10 all night every night- helps sleep. Asks refill Provigil to help with long drives.  Allergy vaccine GO definitely helps- he is convinced.  He notes no asthma and no prednisone since last November- credits frequent handwashing, avoiding people with colds and the allergy vaccine. No new concerns. Continues cardiac rehab.   03/03/11-  39 year old man followed for severe chronic asthma, sleep apnea, allergic rhinitis, complicated by HBP CAD history of atrial fibrillation, GERD. Wife here. He recently had his fourth hospital stay since March, for gallbladder pain, cholecystectomy, cardiac stent. Cholecystectomy was 2 weeks ago. Through these he had shortness of breath and cough which has persisted and keep him awake. His nose runs.  07/03/11- 68 year old man followed for severe chronic asthma, sleep apnea, allergic rhinitis, complicated by HBP, CAD, history of atrial fibrillation, GERD. Wife here. Since last here has been to urgent care in emergency room 4 times for exacerbations of asthma. On prednisone 10 mg/day since February from Dr [name]. Definitely having reflux events and we discussed how that may be the significant issue. Denies sinus infection. Had chest x-rays in January and February which she was told were negative. He asks to try increasing his CPAP pressure because he doesn't feel he is sleeping quite well enough. We discussed this.  07/18/11- 37 year old man followed for severe chronic asthma, sleep apnea, allergic rhinitis, complicated by HBP, CAD, history of atrial fibrillation, GERD. Wife here. Seen by Dr Daub-Increased SOB and wheezing; was put on prednisone to help keep patient out of hospital; still not feeling  much better. Has been on prednisone 60 mg daily for past 4 days but still significant shortness of breath with exertion and wheeze. No excess 2 chest x-rays have been clear and EKG "knot heart". Using home nebulizer with DuoNeb 4 times daily plus his rescue inhaler at least twice per day. Nothing seems infected. He has not recognized heartburn although that has been an issue in the past. Denies chest pain, fever, ankle edema or palpitation. Some nasal congestion without much sneezing. Has been indoors without much exposure to pollen.  08/08/11- 24 year old man followed for severe chronic asthma, sleep apnea, allergic rhinitis, complicated by HBP, CAD, history of atrial fibrillation, GERD. Wife here. Hospitalized April 11-17 with acute exacerbation of his chronic fixed asthma. Anxiety and reflux are thought to be important. He says he is "doing great" now. He feels an anxiety medication would help and we discussed this. Currently on prednisone 20 mg daily.  10/04/11-  4 year old man followed for severe chronic asthma, sleep apnea, allergic rhinitis, complicated by HBP, CAD, history of atrial fibrillation, GERD. Wife here  Pt states increase of sob,wheezing,fatigue since getting out of hospital . No hospitalizations since last here. He stays short of breath especially with exertional dyspnea but is comfortable sitting and when lying in bed. His wife points out he is able to do yard work using a [employer]. We reviewed Dr.Hochrein's cardiology note. Question, , each of his dyspnea is pulmonary versus cardiac. We were asked to draw labs at this visit. He was criticized for using his nebulizer machine too frequently before his hospitalization but has only used it a total of 7 times since then. He had remained on maintenance prednisone  since January. His wife gradually tapered it off so he has had none in the last 3 weeks. He continues, and wants to continue, allergy vaccine at 1:10, well tolerated. CXR 07/22/11-    reviewed with them: Findings:  Grossly unchanged enlarged cardiac silhouette and mediastinal  contours post median sternotomy and CABG. Atherosclerotic  calcification within the thoracic aorta. There is persistent mild  elevation of the right hemidiaphragm. Grossly unchanged bibasilar  heterogeneous opacities favored to represent atelectasis. Grossly  unchanged bones including mild compression deformity of a lower  thoracic vertebral body.  IMPRESSION:  No acute cardiopulmonary disease.  Original Report Authenticated By: Rachel Moulds, M.D.  Office Spirometry: FEV1 1.40/58%, FVC 2.44/78%, FEV1/FVC 0.57/73%, FEF 25-75% 0.56/23%.  01/08/12- 35 year old man followed for severe chronic asthma, sleep apnea, allergic rhinitis, complicated by HBP, CAD, history of atrial fibrillation, GERD. Wife here Needs refill for rescue inhaler as he is leaving for beach in am; breathing is doing pretty well; only one treatment since seeing [place] last. . Asks for an antibiotic to carry.  05/10/2012 Acute OV Complains of increased dyspnea , coughing and wheezing. This woke him up this morning and used his neb without relief then used his inhaler without any relief.  Dry cough and wheezing started last pm.  Patient denies any hemoptysis, orthopnea, PND, leg swelling, or calf pain. Patient reports that wheezes. Started 2-3 days ago and worsened. Last night. Prior to going to bed. He had started on prednisone earlier in the week. at 10 mg and is currently on 5 mg daily .  He had his rescue inhaler this am with some relief.  05/30/12- 29 year old man followed for severe chronic asthma, sleep apnea, allergic rhinitis, complicated by HBP, CAD, history of atrial fibrillation, GERD. Wife here He says he is a well now, off prednisone x2 weeks. Likes Mucinex DM. We discussed steroidsand I suggested bone density assessment. We discussed steroid sparing availability of Daliresp trial is frequency of bronchitic exacerbations.   He continues using CPAP 12/ [facility] with no concerns.  09/05/12- 60 year old man followed for severe chronic asthma, sleep apnea, allergic rhinitis, complicated by HBP, CAD, history of atrial fibrillation, GERD.     Wife here FOLLOWS FOR: had to go to UC on 08-26-12 due to breathing issues. Was given shot, neb tx, and pred taper. Had caught a cold while fishing, leading to exacerbation. Finish prednisone taper 2 days ago. Feels "great" now. Allergy vaccine 1:10 GO CPAP 12/[facility] with good compliance and control CXR 08/26/12 IMPRESSION:  No active disease.  Original Report Authenticated By: Vallery Ridge, M.D.  11/04/16 -57 year old man followed for severe chronic asthma, sleep apnea, allergic rhinitis, complicated by HBP, CAD, history of atrial fibrillation/ pacemaker , GERD.     Wife here( She has mitochondrial myopathy) FOLOWS FOR: reports has been having diff w wheezing denies any chest tightness-- states he has been  seen in [place] [facility] every month for breathing and per that MD patient may need some medication readjustment   wheeze and shortness of breath, sometimes green mucus. Gets better with neb been steroid but then relapses each time. Uses home nebulizer 3 times daily but doesn't last. Blamed Advair for 2 episodes of pneumonia-discussed. They feel he is doing well with allergy vaccine 1: 10 GO. OSA- good compliance and control with CPAP 12/ [facility]..  03/12/13- 43 year old man followed for severe chronic asthma, sleep apnea, allergic rhinitis, complicated by HBP, CAD, history of atrial fibrillation/ pacemaker , GERD.     (Wife  has mitochondrial myopathy) FOLLOWS FOR: since being on Dulera 100 has not had any flare ups or had to use nebulizer machine. Recent hospital stay at [facility] for heart issues- AFib/ tachycardia.  Feels "nervous, chilly" but not sick. Little cough or wheeze. Has been able to cut way down on nebulizer and w/o need recent prednisone. Ankles swell. Note med list shows  both diltiazem and Norvasc.  07/29/13- 89 year old M former smoker(120 pk yr) followed for severe chronic asthma/ COPD, OSA/CPAP, allergic rhinitis, complicated by HBP, CAD, history of AFib/ pacemaker , GERD.     (Wife  has mitochondrial myopathy) Allergy vaccine 1:10 GO     CPAP 12/ [facility]    FOLLOWS FOR: has been in hospital 3 times since Dec 2014. Pt has had flare ups with COPD. Breathing now is at baseline. Going to gym for exercise twice weekly. He likes [employer] Would still like to keep a few Nuvigil tablets around for long drives- discussed. He asks about updating pneumonia vaccine. He is at very high risk for pneumonia and complications. We compared Pneumovax-23 with Prevnar 13 and guidelines. CXR 06/14/13 IMPRESSION:  Findings concerning for right perihilar and infrahilar pneumonia.  Resolution of the previously seen left pneumonia.  Electronically Signed  By: Rolm Baptise M.D.  On: 06/14/2013 13:51 Office spirometry 07/29/2013- moderate obstructive airways disease-FVC 2.50/81%, FEV1 1.62/68%, FEV1/FVC 0.65, FEF 25-75% 0.90/39%.  11/25/13- 25 year old M former smoker(120 pk yr) followed for severe chronic asthma/ COPD, OSA/CPAP, allergic rhinitis, complicated by HBP, CAD, history of AFib/ pacemaker , GERD.     (Wife  has mitochondrial myopathy) FOLLOWS YHC:WCBJSEGBT good since on Dulera (in [place]),no cough,no wheezing,sleeps well,CPAP works well Allergy vaccine 1:10 GO     CPAP 12/ [facility]    01/08/14-  41 year old M former smoker(120 pk yr) followed for severe chronic asthma/ COPD, OSA/CPAP, allergic rhinitis, complicated by HBP, CAD/ CABG, history of AFib/ pacemaker , GERD, recurrent aspiration pneumonia.      (Wife here-  has mitochondrial myopathy) Post Hospital-01/01/14 through 01/04/14 for [facility] PNA c/w aspiration Swallowing eval showed mild dysphagia- need chin tuck Ba swallow 01/08/14- confirmed reflux into lower 1/3 esophagus, esophageal stricture  Allergy vaccine 1:10 GO     CPAP  12/ [facility] CXR 01/01/14 IMPRESSION:  1. Persistent right lower lobe infiltrate.  2. Prior CABG. Mild cardiomegaly, no pulmonary venous congestion.  Electronically Signed  By: Marcello Moores Register  On: 01/01/2014 10:25  05/13/14- 33 year old M former smoker(120 pk yr) followed for severe chronic asthma/ COPD, OSA/CPAP, allergic rhinitis, complicated by HBP, CAD/ CABG, history of AFib/ pacemaker , GERD, recurrent aspiration pneumonia. FOLLOW FOR:  allergies; pt currently has shingles.  Wife here Breathing feels fairly well controlled now. No prednisone in a year GERD controlled better. Chin tuck helps swallow without cough.  09/11/14- 30 year old M former smoker(120 pk yr) followed for severe chronic asthma/ COPD, OSA/CPAP, allergic rhinitis, complicated by HBP, CAD/ CABG, history of AFib/ pacemaker , GERD, recurrent aspiration pneumonia. FOLLOWS FOR: Pt states he is doing well with breathing; has had issues several times with BP.  Wears CPAP 12/ [facility] every night and at nap time-[facility]) no dl recently. Continues allergy vaccine 1:10 GO   wife here His breathing is doing well with no recent exacerbation. Little cough and very little wheeze, no phlegm. Is mostly concerned about elevated blood pressures and is following with cardiology. Some question about renal function which she will discuss with Dr [name].    ROS-see HPI Constitutional:   No-   weight  loss, night sweats, fevers, chills, fatigue, lassitude. HEENT:   No-  headaches, difficulty swallowing, tooth/dental problems, sore throat,       No-  sneezing, itching, ear ache, nasal congestion, post nasal drip,  CV:  No-   chest pain, orthopnea, PND, swelling in lower extremities, anasarca, dizziness, palpitations Resp: +shortness of breath with exertion or at rest.              No-   productive cough,  + non-productive cough,  No- coughing up of blood.              No-change in color of mucus.  No- wheezing.   Skin: No-   rash or lesions. GI:  No-    heartburn, indigestion, abdominal pain, nausea, vomiting,  GU:  MS:  No-   joint pain or swelling.   Neuro-     nothing unusual Psych:  No- change in mood or affect. No depression or anxiety.  No memory loss.  Objective:  OBJ- Physical Exam General- Alert, Oriented, Affect-appropriate, Distress- none acute Skin- + staining on forearms from old ecchymoses Lymphadenopathy- none Head- atraumatic            Eyes- Gross vision intact, PERRLA, conjunctivae and secretions clear            Ears- + Hearing aid            Nose- Clear, no-Septal dev, mucus, polyps, erosion, perforation             Throat- Mallampati III , mucosa clear , drainage- none, tonsils- atrophic Neck- flexible , trachea midline, no stridor , thyroid nl, carotid no bruit Chest - symmetrical excursion , unlabored           Heart/CV- RRR ,murmur+2/6 syst , no gallop  , no rub, nl s1 s2                           - JVD- none , edema-none, stasis changes- none, varices- none           Lung- +Clear/distant, wheeze- none, unlabored, cough- none , dullness-none, rub- none           Chest wall- Hyperinflated barrel chest Abd-  Br/ Gen/ Rectal- Not done, not indicated Extrem- cyanosis- none, clubbing, none, atrophy- none, strength- nl Neuro- grossly intact to observation. No tremor

## 2014-09-11 NOTE — Assessment & Plan Note (Signed)
He continues well controlled. We discussed factors leading to improvement compared with a couple of years ago. Better medication and better compliance, less reflux. There has been a component in the past of heart failure which I think is also better controlled.

## 2014-09-17 ENCOUNTER — Other Ambulatory Visit (HOSPITAL_COMMUNITY): Payer: Self-pay

## 2014-09-17 ENCOUNTER — Observation Stay (HOSPITAL_COMMUNITY)
Admission: EM | Admit: 2014-09-17 | Discharge: 2014-09-18 | Disposition: A | Discharge status: Home / Self Care | Payer: Medicare Other | Attending: Internal Medicine | Admitting: Internal Medicine

## 2014-09-17 ENCOUNTER — Ambulatory Visit (INDEPENDENT_AMBULATORY_CARE_PROVIDER_SITE_OTHER): Payer: Medicare Other | Admitting: Sports Medicine

## 2014-09-17 ENCOUNTER — Encounter (HOSPITAL_COMMUNITY): Payer: Self-pay | Admitting: Emergency Medicine

## 2014-09-17 VITALS — BP 235/90 | HR 105 | Temp 97.9°F | Resp 20 | Ht 61.0 in | Wt 152.5 lb

## 2014-09-17 DIAGNOSIS — G4730 Sleep apnea, unspecified: Secondary | ICD-10-CM | POA: Insufficient documentation

## 2014-09-17 DIAGNOSIS — J449 Chronic obstructive pulmonary disease, unspecified: Secondary | ICD-10-CM | POA: Insufficient documentation

## 2014-09-17 DIAGNOSIS — I509 Heart failure, unspecified: Secondary | ICD-10-CM | POA: Diagnosis not present

## 2014-09-17 DIAGNOSIS — H409 Unspecified glaucoma: Secondary | ICD-10-CM | POA: Insufficient documentation

## 2014-09-17 DIAGNOSIS — Z87891 Personal history of nicotine dependence: Secondary | ICD-10-CM | POA: Diagnosis not present

## 2014-09-17 DIAGNOSIS — R4589 Other symptoms and signs involving emotional state: Secondary | ICD-10-CM | POA: Diagnosis present

## 2014-09-17 DIAGNOSIS — R079 Chest pain, unspecified: Secondary | ICD-10-CM | POA: Insufficient documentation

## 2014-09-17 DIAGNOSIS — H269 Unspecified cataract: Secondary | ICD-10-CM | POA: Diagnosis not present

## 2014-09-17 DIAGNOSIS — Z85828 Personal history of other malignant neoplasm of skin: Secondary | ICD-10-CM | POA: Diagnosis not present

## 2014-09-17 DIAGNOSIS — F418 Other specified anxiety disorders: Secondary | ICD-10-CM | POA: Diagnosis present

## 2014-09-17 DIAGNOSIS — I10 Essential (primary) hypertension: Secondary | ICD-10-CM

## 2014-09-17 DIAGNOSIS — R51 Headache: Secondary | ICD-10-CM | POA: Insufficient documentation

## 2014-09-17 DIAGNOSIS — R509 Fever, unspecified: Secondary | ICD-10-CM | POA: Insufficient documentation

## 2014-09-17 DIAGNOSIS — Z7952 Long term (current) use of systemic steroids: Secondary | ICD-10-CM | POA: Diagnosis not present

## 2014-09-17 DIAGNOSIS — K219 Gastro-esophageal reflux disease without esophagitis: Secondary | ICD-10-CM | POA: Diagnosis not present

## 2014-09-17 DIAGNOSIS — E785 Hyperlipidemia, unspecified: Secondary | ICD-10-CM | POA: Diagnosis not present

## 2014-09-17 DIAGNOSIS — I2510 Atherosclerotic heart disease of native coronary artery without angina pectoris: Secondary | ICD-10-CM | POA: Diagnosis not present

## 2014-09-17 DIAGNOSIS — M810 Age-related osteoporosis without current pathological fracture: Secondary | ICD-10-CM | POA: Insufficient documentation

## 2014-09-17 DIAGNOSIS — F1021 Alcohol dependence, in remission: Secondary | ICD-10-CM | POA: Insufficient documentation

## 2014-09-17 DIAGNOSIS — K5790 Diverticulosis of intestine, part unspecified, without perforation or abscess without bleeding: Secondary | ICD-10-CM | POA: Insufficient documentation

## 2014-09-17 DIAGNOSIS — K7689 Other specified diseases of liver: Secondary | ICD-10-CM | POA: Diagnosis not present

## 2014-09-17 DIAGNOSIS — K222 Esophageal obstruction: Secondary | ICD-10-CM | POA: Insufficient documentation

## 2014-09-17 DIAGNOSIS — M1990 Unspecified osteoarthritis, unspecified site: Secondary | ICD-10-CM | POA: Diagnosis not present

## 2014-09-17 DIAGNOSIS — I160 Hypertensive urgency: Secondary | ICD-10-CM

## 2014-09-17 DIAGNOSIS — Z79899 Other long term (current) drug therapy: Secondary | ICD-10-CM | POA: Diagnosis not present

## 2014-09-17 DIAGNOSIS — I480 Paroxysmal atrial fibrillation: Secondary | ICD-10-CM | POA: Diagnosis present

## 2014-09-17 DIAGNOSIS — F419 Anxiety disorder, unspecified: Secondary | ICD-10-CM | POA: Insufficient documentation

## 2014-09-17 DIAGNOSIS — I4891 Unspecified atrial fibrillation: Secondary | ICD-10-CM | POA: Insufficient documentation

## 2014-09-17 DIAGNOSIS — F329 Major depressive disorder, single episode, unspecified: Secondary | ICD-10-CM | POA: Insufficient documentation

## 2014-09-17 DIAGNOSIS — Z9861 Coronary angioplasty status: Secondary | ICD-10-CM

## 2014-09-17 LAB — BASIC METABOLIC PANEL
Anion gap: 13 (ref 5–15)
BUN: 18 mg/dL (ref 6–20)
CO2: 24 mmol/L (ref 22–32)
CREATININE: 0.95 mg/dL (ref 0.61–1.24)
Calcium: 9.2 mg/dL (ref 8.9–10.3)
Chloride: 100 mmol/L — ABNORMAL LOW (ref 101–111)
GFR calc Af Amer: >60 mL/min (ref >60)
Glucose, Bld: 99 mg/dL (ref 65–99)
Potassium: 4.1 mmol/L (ref 3.5–5.1)
Sodium: 137 mmol/L (ref 135–145)

## 2014-09-17 LAB — CBC WITH DIFFERENTIAL/PLATELET
BASOS ABS: 0 10*3/uL (ref 0.0–0.1)
Basophils Relative: 0 % (ref 0–1)
EOS ABS: 0 10*3/uL (ref 0.0–0.7)
Eosinophils Relative: 0 % (ref 0–5)
HCT: 43.4 % (ref 39.0–52.0)
Hemoglobin: 14.4 g/dL (ref 13.0–17.0)
Lymphocytes Relative: 11 % — ABNORMAL LOW (ref 12–46)
Lymphs Abs: 1.1 10*3/uL (ref 0.7–4.0)
MCH: 29.8 pg (ref 26.0–34.0)
MCHC: 33.2 g/dL (ref 30.0–36.0)
MCV: 89.7 fL (ref 78.0–100.0)
Monocytes Absolute: 0.7 10*3/uL (ref 0.1–1.0)
Monocytes Relative: 7 % (ref 3–12)
Neutro Abs: 8.7 10*3/uL — ABNORMAL HIGH (ref 1.7–7.7)
Neutrophils Relative %: 82 % — ABNORMAL HIGH (ref 43–77)
PLATELETS: 211 10*3/uL (ref 150–400)
RBC: 4.84 MIL/uL (ref 4.22–5.81)
RDW: 15 % (ref 11.5–15.5)
WBC: 10.6 10*3/uL — ABNORMAL HIGH (ref 4.0–10.5)

## 2014-09-17 LAB — URINALYSIS, ROUTINE W REFLEX MICROSCOPIC
Bilirubin Urine: NEGATIVE
Glucose, UA: NEGATIVE mg/dL
HGB URINE DIPSTICK: NEGATIVE
KETONES UR: NEGATIVE mg/dL
Leukocytes, UA: NEGATIVE
Nitrite: NEGATIVE
Protein, ur: NEGATIVE mg/dL
SPECIFIC GRAVITY, URINE: 1.005 (ref 1.005–1.030)
Urobilinogen, UA: 0.2 mg/dL (ref 0.0–1.0)
pH: 7.5 (ref 5.0–8.0)

## 2014-09-17 LAB — I-STAT TROPONIN, ED: Troponin i, poc: 0.03 ng/mL (ref 0.00–0.08)

## 2014-09-17 MED ORDER — HYDRALAZINE HCL 20 MG/ML IJ SOLN
20.0000 mg | Freq: Once | INTRAMUSCULAR | Status: AC
  Administered 2014-09-17: 20 mg via INTRAVENOUS
  Filled 2014-09-17: qty 1

## 2014-09-17 MED ORDER — NITROGLYCERIN IN D5W 200-5 MCG/ML-% IV SOLN
5.0000 ug/min | Freq: Once | INTRAVENOUS | Status: DC
  Filled 2014-09-17: qty 250

## 2014-09-17 NOTE — ED Provider Notes (Signed)
Patient presented to the ER with chest pain and hypertension. Patient was seen in urgent care earlier for elevated blood pressure. He does report that he had chest pain earlier today and he took nitroglycerin. Chest pain resolved, but he continues to be hypertensive. He reports a history of labile hypertension that is difficult to control. He does tell me that his normal blood pressure is around 160/70, has been as high as 423 systolic today. He does not have headache or blurred vision. There is no shortness of breath.  Face to face Exam: HEENT - PERRLA Lungs - CTAB Heart - RRR, no M/R/G Abd - S/NT/ND Neuro - alert, oriented x3  Plan: Check labs, troponin. IV hydralazine for blood pressure control.  Orpah Greek, MD 09/17/14 [id number]

## 2014-09-17 NOTE — Progress Notes (Signed)
I have seen this pt along with DR. Paulla Fore and agree with his assessment and plan of care

## 2014-09-17 NOTE — ED Notes (Signed)
Pt  Sent from [facility] for htn- hx of same and needing IV medications. Pt c.o shaking. Also reports headache earlier today. Pt conscious a&o at present. bp at uc 222/90

## 2014-09-17 NOTE — ED Provider Notes (Signed)
CSN: [id number]     Arrival date & time 09/17/14  2111 History   First MD Initiated Contact with Patient 09/17/14 2141     Chief Complaint  Patient presents with  . Hypertension     (Consider location/radiation/quality/duration/timing/severity/associated sxs/prior Treatment) Patient is a 81 y.o. male presenting with hypertension. The history is provided by the patient and the spouse. No language interpreter was used.  Hypertension Pertinent negatives include no chills, fever, headaches, numbness or weakness.   [name] is an 81 year old male with a history of hypertension, atrial fibrillation, sleep apnea, asthma, hyperlipidemia, CHF, COPD, quadruple bypass who presents for high blood pressure and chest pain. He states that he was at the hospital all night with his wife and went home this morning. He states he checked his blood pressure heart rate like he does daily and his blood pressure was 212/115. He states he had chest pain so he took one nitroglycerin and 5 times which relieved his pain. He states he took his blood pressure medication on time and took an additional blood pressure pill because his blood pressure was so high. He was seen at urgent care prior to coming to the ED and was sent here. He denies any fever, chills, cough, shortness of breath, diaphoresis, abdominal pain, nausea, vomiting, diarrhea, constipation, increased leg swelling. He denies any shortness of breath on exertion. Patient states he does not know what he takes for hypertension.  Past Medical History  Diagnosis Date  . CORONARY HEART DISEASE     a. s/p CABG;  b.  cath 06/27/10: S-Dx occluded, S-PDA 80-90% (tx with PCI); S-OM ok, L-LAD ok;  EF 65-70%  c.  s/p Promus DES to S-PDA 06/2010;   d. Myoview 8/12: low risk  . FIBRILLATION, ATRIAL     post op; ?documented during hosp. 06/2010  . SLEEP APNEA 09/2001    NPSG AHI 22/HR  . ALLERGIC RHINITIS   . ASTHMA   . Depression   . GERD     with HH, hx esophageal stricture   . DYSLIPIDEMIA   . HYPERTENSION     Echo 3/12: EF 55-60%; mod LVH; mild AS/AI; LAE; PASP 38; mild pulmo HTN  . Personal history of alcoholism   . Diverticulosis   . Benign liver cyst   . Skin cancer     L forearm  . Cataract     surgery to both eyes  . Esophageal stricture   . Hiatal hernia   . Pneumonia 04/2013 hosp  . Shortness of breath   . Allergy   . Anxiety   . Arthritis   . CHF (congestive heart failure)   . COPD (chronic obstructive pulmonary disease)   . Glaucoma   . Osteoporosis   . History of heart attack    Past Surgical History  Procedure Laterality Date  . Coronary artery bypass graft  02/2007  . Tonsillectomy    . Coronary angioplasty with stent placement  07/2010  . Cataract extraction, bilateral  2007  . Hernia repair  unsure ?60's  . Cholecystectomy  02/21/2011    Procedure: LAPAROSCOPIC CHOLECYSTECTOMY WITH INTRAOPERATIVE CHOLANGIOGRAM;  Surgeon: Pedro Earls, MD;  Location: [facility];  Service: General;  Laterality: N/A;   Family History  Problem Relation Age of Onset  . COPD Sister   . Asthma Sister   . Emphysema Sister   . Hypertension Sister   . Hyperlipidemia Sister   . Hypertension Mother   . Heart disease Mother   .  Hyperlipidemia Mother   . Colon cancer Neg Hx   . Heart disease Brother   . Heart disease Father   . Hyperlipidemia Father   . Hypertension Father    History  Substance Use Topics  . Smoking status: Former Smoker -- 3.00 packs/day for 40 years    Types: Cigarettes    Quit date: 04/11/1979  . Smokeless tobacco: Never Used  . Alcohol Use: No     Comment: quit drinking 40+ years    Review of Systems  Constitutional: Negative for fever and chills.  Neurological: Negative for dizziness, syncope, speech difficulty, weakness, numbness and headaches.  All other systems reviewed and are negative.      Allergies  Ace inhibitors; Other; Codeine; and Lasix  Home Medications   Prior to Admission medications    Medication Sig Start Date End Date Taking? Authorizing Provider  albuterol (PROVENTIL HFA;VENTOLIN HFA) 108 (90 BASE) MCG/ACT inhaler Inhale 2 puffs into the lungs every 6 (six) hours as needed for wheezing or shortness of breath. 06/24/14  Yes Deneise Lever, MD  budesonide (PULMICORT) 0.25 MG/2ML nebulizer solution Take 0.25 mg by nebulization at bedtime. For asthma   Yes Historical Provider, MD  calcium-vitamin D (OSCAL WITH D) 500-200 MG-UNIT per tablet Take 1 tablet by mouth 2 (two) times daily.   Yes Historical Provider, MD  cholestyramine Lucrezia Starch) 4 G packet take 1 packet by mouth twice a day if needed for LOOSE STOOLS 09/09/14  Yes Irene Shipper, MD  cloNIDine (CATAPRES) 0.3 MG tablet Take 1 tablet (0.3 mg total) by mouth 2 (two) times daily. 04/07/14  Yes Rowe Clack, MD  denosumab (PROLIA) 60 MG/ML SOLN injection Inject 60 mg into the skin every 6 (six) months. Administer in upper arm, thigh, or abdomen 06/12/12  Yes Rowe Clack, MD  diltiazem (CARDIZEM CD) 120 MG 24 hr capsule Take 1 capsule (120 mg total) by mouth 2 (two) times daily. 04/22/14  Yes Rowe Clack, MD  DULERA 100-5 MCG/ACT AERO inhale 2 puffs by mouth once daily (THEN RINSE MOUTH AFTER USE) 07/14/14  Yes Rowe Clack, MD  ELIQUIS 5 MG TABS tablet take 1 tablet by mouth twice a day 08/10/14  Yes Minus Breeding, MD  ferrous sulfate 325 (65 FE) MG tablet take 1 tablet by mouth three times a day with food 02/06/14  Yes Minus Breeding, MD  hydrALAZINE (APRESOLINE) 50 MG tablet Take 1.5 tablets (75 mg total) by mouth 3 (three) times daily. 07/20/14  Yes Nishant Dhungel, MD  ipratropium-albuterol (DUONEB) 0.5-2.5 (3) MG/3ML SOLN Take 3 mLs by nebulization at bedtime. 09/09/14  Yes Rowe Clack, MD  isosorbide mononitrate (IMDUR) 60 MG 24 hr tablet Take 1 tablet (60 mg total) by mouth daily. 04/22/14  Yes Rowe Clack, MD  latanoprost (XALATAN) 0.005 % ophthalmic solution Place 1 drop into both eyes at  bedtime.    Yes Historical Provider, MD  LORazepam (ATIVAN) 0.5 MG tablet Take 1 tablet (0.5 mg total) by mouth 3 (three) times daily. 09/10/14  Yes Rowe Clack, MD  losartan (COZAAR) 100 MG tablet Take 1 tablet (100 mg total) by mouth every evening. 09/09/14  Yes Rowe Clack, MD  LYSINE PO Take 1 tablet by mouth daily at 12 noon.   Yes Historical Provider, MD  montelukast (SINGULAIR) 10 MG tablet Take 10 mg by mouth at bedtime.   Yes Historical Provider, MD  nebivolol (BYSTOLIC) 10 MG tablet Take 1 tablet (10 mg total)  by mouth daily. 06/30/14  Yes Minus Breeding, MD  nitroGLYCERIN (NITROSTAT) 0.4 MG SL tablet Place 1 tablet (0.4 mg total) under the tongue every 5 (five) minutes as needed for chest pain. 07/13/14  Yes Minus Breeding, MD  pantoprazole (PROTONIX) 40 MG tablet take 1 tablet by mouth twice a day 02/24/14  Yes Minus Breeding, MD  potassium chloride SA (K-DUR,KLOR-CON) 20 MEQ tablet take 1 tablet by mouth twice a day 07/31/14  Yes Minus Breeding, MD  pravastatin (PRAVACHOL) 80 MG tablet take 1 tablet by mouth every evening 08/26/14  Yes Minus Breeding, MD  predniSONE (STERAPRED UNI-PAK 21 TAB) 10 MG (21) TBPK tablet Take 1 tablet (10 mg total) by mouth daily. Or taper as directed 09/09/14  Yes Rowe Clack, MD  Probiotic Product ([place] PO) Take 1 capsule by mouth every evening.    Yes Historical Provider, MD  RA VITAMIN B-12 TR 1000 MCG TBCR take 1 tablet by mouth once daily 01/23/14  Yes Rowe Clack, MD  torsemide (DEMADEX) 20 MG tablet Take 1 tablet (20 mg total) by mouth 3 (three) times daily. Patient taking differently: Take 20 mg by mouth 3 (three) times daily as needed (fluid retention).  09/09/14  Yes Rowe Clack, MD   BP 210/78 mmHg  Pulse 90  Temp(Src) 97.7 F (36.5 C) (Oral)  Resp 16  SpO2 97% Physical Exam  Constitutional: He is oriented to person, place, and time. He appears well-developed and well-nourished.  HENT:  Head:  Normocephalic and atraumatic.  Eyes: Conjunctivae are normal.  Neck: Normal range of motion. Neck supple.  Cardiovascular: Normal rate, regular rhythm and normal heart sounds.   Pulmonary/Chest: Effort normal and breath sounds normal. No respiratory distress. He has no wheezes. He has no rales.  Abdominal: Soft. There is no tenderness.  Musculoskeletal: Normal range of motion. He exhibits no edema or tenderness.  Neurological: He is alert and oriented to person, place, and time. He has normal strength. No sensory deficit. GCS eye subscore is 4. GCS verbal subscore is 5. GCS motor subscore is 6.  Cranial nerves III through XII intact  Skin: Skin is warm and dry.  Nursing note and vitals reviewed.   ED Course  Procedures (including critical care time) Labs Review Labs Reviewed  CBC WITH DIFFERENTIAL/PLATELET - Abnormal; Notable for the following:    WBC 10.6 (*)    Neutrophils Relative % 82 (*)    Neutro Abs 8.7 (*)    Lymphocytes Relative 11 (*)    All other components within normal limits  BASIC METABOLIC PANEL - Abnormal; Notable for the following:    Chloride 100 (*)    All other components within normal limits  URINALYSIS, ROUTINE W REFLEX MICROSCOPIC (NOT AT [facility])  BRAIN NATRIURETIC PEPTIDE  I-STAT TROPOININ, ED    Imaging Review No results found.  EKG interpretation 09/17/14 22:39 Vent. rate 92 BPM PR interval 64 ms QRS duration 97 ms QT/QTc 386/477 ms P-R-T axes 0 46 41 Sinus rhythm Short PR interval  I agree with the EKG interpretation, Ottie Glazier, PA-C.  MDM   Final diagnoses:  Uncontrolled hypertension  Chest pain, unspecified chest pain type  Patient will likely need admission due to uncontrolled hypertension and chest pain relieved by nitroglycerin that he took at home. He has a cardiac history of quadruple bypass and stent. He states he hardly has chest pain and rarely takes nitroglycerin. He states his chest pain has since resolved I have not given  him aspirin or nitroglycerin in the ED. I have started him on hydralazine.  He is currently non toxic and well appearing.  He is resting comfortably and in no acute distress. He has no neurological deficits. Filed Vitals:   09/17/14 2322  BP: 210/78  Pulse: 90  Temp:   Resp: 16   I spoke to Dr. Hal Hope who will admit to medsurg and requested a nitro drip.       Ottie Glazier, PA-C 09/17/14 2344  Orpah Greek, MD 09/17/14 2350

## 2014-09-17 NOTE — Assessment & Plan Note (Addendum)
Markedly elevated today compared to his previous baseline.  Has had marked elevation similar to this in the past. These have responded with IV medicines without any evidence of acute end organ damage. We discussed with him today that unfortunately with his labile and markedly elevated blood pressure greater than 220 on recheck further observation and IV blood pressure control is warranted and we recommended evaluation in the emergency department. Due to the unclear etiology and the labile nature of his BP further Inpatient evaluation is likely warranted. His blood pressure has been this high previously but given the fact he is on anticoagulation with Eliquis and has malignant range BPs he is at significant risk for hemorrhagic stroke. Recommended transport to hospital via EMS however patient declined and will transport himself at this time. I will call and discuss with [place] likely suspect he will need further evaluation including renal artery doppler and ECHO especially in light of significant cardiac murmur that is unexplained on prior [facility]

## 2014-09-17 NOTE — Progress Notes (Signed)
[name] - 81 y.o. male MRN [id number]  Date of birth: [birth year]  SUBJECTIVE: Including CC, HPI, ROS Chief Complaint  Patient presents with  . Hypertension   HPI Comments: Worsening systolic and diastolic hypertension over the past 24 hours. Noted to be 210s over 110s this morning. Diastolic response with hydralazine. Of note he has been up the majority of the past 24 hours due to his wife being hospitalized for hypotension secondary to nerve disorder. He has increased anxiety and this been a stressful situation for him.  Hypertension This is a chronic problem. The current episode started today (Markedly elevated over the past 24 hours.). The problem is unchanged. The problem is resistant. Associated symptoms include anxiety, headaches, malaise/fatigue and peripheral edema (chronic unchanged over the past several days). Pertinent negatives include no blurred vision, chest pain, neck pain, PND, shortness of breath or sweats. There is no history of angina.    Review of Systems  Constitutional: Positive for malaise/fatigue.  Eyes: Negative for blurred vision.  Respiratory: Negative for shortness of breath.   Cardiovascular: Negative for chest pain and PND.  Musculoskeletal: Negative for neck pain.  Neurological: Positive for headaches.    OBJECTIVE: Filed Vitals:   09/17/14 2007 09/17/14 2045  BP: 210/79 235/90  Pulse: 96 105  Temp: 97.9 F (36.6 C)   TempSrc: Oral   Resp: 20   Height: 5\' 1"  (1.549 m)   Weight: 152 lb 8 oz (69.174 kg)   SpO2: 97%     Physical Exam  Constitutional: He is well-developed, well-nourished, and in no distress.  HENT:  Head: Normocephalic and atraumatic.  Right Ear: External ear normal.  Left Ear: External ear normal.  Neck: Trachea normal. No tracheal deviation present.  Cardiovascular: Intact distal pulses.  An irregularly irregular rhythm present. Tachycardia present.  PMI is not displaced.  Exam reveals no friction rub.   Murmur  heard.  Systolic murmur is present with a grade of 3/6  Pulmonary/Chest: Effort normal and breath sounds normal. No accessory muscle usage. No tachypnea and no bradypnea. No respiratory distress. He has no decreased breath sounds. He has no wheezes. He has no rhonchi. He has no rales. He exhibits no tenderness.  Abdominal: Soft. He exhibits no distension.  Musculoskeletal: He exhibits edema (2+). He exhibits no tenderness.  Neurological: He is alert.  Moves all 4 extremities spontaneously; no lateralization.  Skin: Skin is warm and dry. He is not diaphoretic.  Psychiatric: Mood, memory, affect and judgment normal.  Vitals reviewed.    DATA REVIEWED & OBTAINED:  EKG: 09/17/2014) Artemisia Auvil and Dr. Lorelei Pont Rate & Rhythm:  sinus rhythm rate of 92,  Axis:   normal unchanged   Intervals:   normal, unchanged incomplete left bundle-branch block,   Other: no evidence of ischemia, unchanged from prior    HISTORY: Complex cardiopulmonary history including CAD, A. fib, sleep apnea, asthma. Followed by Dr. Casimer Lanius &  Dr. Annamaria Boots. 5 hospitalizations/ER visits for malignant hypertension in the past year including 1 in [place] while on vacation.. Often requires IV blood pressure control with hydralazine  Echocardiogram and renal artery ultrasounds pending for later this month due to worsening murmur & potential of  renovascular hypertension. No specialty comments available. Social History   Occupational History  . retired     2 years   Social History Main Topics  . Smoking status: Former Smoker -- 3.00 packs/day for 40 years    Types: Cigarettes    Quit date: 04/11/1979  .  Smokeless tobacco: Never Used  . Alcohol Use: No     Comment: quit drinking 40+ years  . Drug Use: No  . Sexual Activity: Yes     Problem  Hypertensive Urgency      ASSESSMENT & PLAN: See Patient instructions for additional information   ICD-9-CM ICD-10-CM   1. Asymptomatic hypertensive urgency 401.9 I10 EKG 12-Lead    2. Hypertensive urgency 401.9 I10      Hypertensive urgency Markedly elevated today compared to his previous baseline.  Has had marked elevation similar to this in the past. These have responded with IV medicines without any evidence of acute end organ damage. We discussed with him today that unfortunately with his labile and markedly elevated blood pressure greater than 220 on recheck further observation and IV blood pressure control is warranted and we recommended evaluation in the emergency department. Due to the unclear etiology and the labile nature of his BP further Inpatient evaluation is likely warranted. His blood pressure has been this high previously but given the fact he is on anticoagulation with Eliquis and has malignant range BPs he is at significant risk for hemorrhagic stroke. Recommended transport to hospital via EMS however patient declined and will transport himself at this time. I will call and discuss with [place] likely suspect he will need further evaluation including renal artery doppler and ECHO especially in light of significant cardiac murmur that is unexplained on prior [facility]   >50% of this 25 minute visit spent in direct patient counseling and/or coordination of care.   FOLLOW UP:  Return go to ED.

## 2014-09-17 NOTE — ED Notes (Signed)
Pt c/o hypertension; has been compliant with medications. Reports intermittent headaches with increased blood pressure. Also reports increased stress due to wife's health.

## 2014-09-18 ENCOUNTER — Encounter (HOSPITAL_COMMUNITY): Payer: Self-pay | Admitting: Internal Medicine

## 2014-09-18 ENCOUNTER — Observation Stay (HOSPITAL_COMMUNITY): Payer: Medicare Other

## 2014-09-18 DIAGNOSIS — I2510 Atherosclerotic heart disease of native coronary artery without angina pectoris: Secondary | ICD-10-CM

## 2014-09-18 DIAGNOSIS — R079 Chest pain, unspecified: Secondary | ICD-10-CM | POA: Diagnosis not present

## 2014-09-18 DIAGNOSIS — I10 Essential (primary) hypertension: Secondary | ICD-10-CM

## 2014-09-18 DIAGNOSIS — F418 Other specified anxiety disorders: Secondary | ICD-10-CM

## 2014-09-18 DIAGNOSIS — I480 Paroxysmal atrial fibrillation: Secondary | ICD-10-CM

## 2014-09-18 DIAGNOSIS — R0789 Other chest pain: Secondary | ICD-10-CM | POA: Diagnosis not present

## 2014-09-18 DIAGNOSIS — Z9861 Coronary angioplasty status: Secondary | ICD-10-CM | POA: Diagnosis not present

## 2014-09-18 LAB — CBC WITH DIFFERENTIAL/PLATELET
BASOS ABS: 0 10*3/uL (ref 0.0–0.1)
BASOS PCT: 0 % (ref 0–1)
EOS ABS: 0.2 10*3/uL (ref 0.0–0.7)
Eosinophils Relative: 3 % (ref 0–5)
HCT: 40.2 % (ref 39.0–52.0)
HEMOGLOBIN: 13.3 g/dL (ref 13.0–17.0)
LYMPHS ABS: 1.5 10*3/uL (ref 0.7–4.0)
LYMPHS PCT: 20 % (ref 12–46)
MCH: 29.6 pg (ref 26.0–34.0)
MCHC: 33.1 g/dL (ref 30.0–36.0)
MCV: 89.3 fL (ref 78.0–100.0)
Monocytes Absolute: 0.9 10*3/uL (ref 0.1–1.0)
Monocytes Relative: 12 % (ref 3–12)
NEUTROS PCT: 65 % (ref 43–77)
Neutro Abs: 5.1 10*3/uL (ref 1.7–7.7)
Platelets: 179 10*3/uL (ref 150–400)
RBC: 4.5 MIL/uL (ref 4.22–5.81)
RDW: 14.9 % (ref 11.5–15.5)
WBC: 7.8 10*3/uL (ref 4.0–10.5)

## 2014-09-18 LAB — TROPONIN I

## 2014-09-18 LAB — BRAIN NATRIURETIC PEPTIDE: B NATRIURETIC PEPTIDE 5: 249.5 pg/mL — AB (ref 0.0–100.0)

## 2014-09-18 LAB — COMPREHENSIVE METABOLIC PANEL
ALT: 17 U/L (ref 17–63)
ANION GAP: 8 (ref 5–15)
AST: 35 U/L (ref 15–41)
Albumin: 3.5 g/dL (ref 3.5–5.0)
Alkaline Phosphatase: 54 U/L (ref 38–126)
BUN: 14 mg/dL (ref 6–20)
CO2: 26 mmol/L (ref 22–32)
CREATININE: 0.91 mg/dL (ref 0.61–1.24)
Calcium: 8.6 mg/dL — ABNORMAL LOW (ref 8.9–10.3)
Chloride: 106 mmol/L (ref 101–111)
GLUCOSE: 100 mg/dL — AB (ref 65–99)
POTASSIUM: 4.4 mmol/L (ref 3.5–5.1)
SODIUM: 140 mmol/L (ref 135–145)
TOTAL PROTEIN: 5.9 g/dL — AB (ref 6.5–8.1)
Total Bilirubin: 2 mg/dL — ABNORMAL HIGH (ref 0.3–1.2)

## 2014-09-18 MED ORDER — LORAZEPAM 0.5 MG PO TABS: 0.5000 mg | ORAL_TABLET | Freq: Three times a day (TID) | ORAL | Status: DC

## 2014-09-18 MED ORDER — NEBIVOLOL HCL 5 MG PO TABS
10.0000 mg | ORAL_TABLET | Freq: Every day | ORAL | Status: DC
  Administered 2014-09-18: 10 mg via ORAL
  Filled 2014-09-18: qty 2

## 2014-09-18 MED ORDER — ENOXAPARIN SODIUM 40 MG/0.4ML ~~LOC~~ SOLN: 40.0000 mg | SUBCUTANEOUS | Status: DC

## 2014-09-18 MED ORDER — ACETAMINOPHEN 650 MG RE SUPP: 650.0000 mg | Freq: Four times a day (QID) | RECTAL | Status: DC | PRN

## 2014-09-18 MED ORDER — PREDNISONE 10 MG PO TABS
10.0000 mg | ORAL_TABLET | Freq: Every day | ORAL | Status: DC
  Administered 2014-09-18: 10 mg via ORAL
  Filled 2014-09-18: qty 1

## 2014-09-18 MED ORDER — HYDRALAZINE HCL 20 MG/ML IJ SOLN: 10.0000 mg | INTRAMUSCULAR | Status: DC | PRN

## 2014-09-18 MED ORDER — DILTIAZEM HCL ER COATED BEADS 120 MG PO CP24
120.0000 mg | ORAL_CAPSULE | Freq: Two times a day (BID) | ORAL | Status: DC
  Administered 2014-09-18 (×2): 120 mg via ORAL
  Filled 2014-09-18 (×2): qty 1

## 2014-09-18 MED ORDER — ACETAMINOPHEN 325 MG PO TABS: 650.0000 mg | ORAL_TABLET | Freq: Four times a day (QID) | ORAL | Status: DC | PRN

## 2014-09-18 MED ORDER — APIXABAN 5 MG PO TABS
5.0000 mg | ORAL_TABLET | Freq: Two times a day (BID) | ORAL | Status: DC
  Administered 2014-09-18 (×2): 5 mg via ORAL
  Filled 2014-09-18 (×2): qty 1

## 2014-09-18 MED ORDER — TORSEMIDE 20 MG PO TABS: 20.0000 mg | ORAL_TABLET | Freq: Three times a day (TID) | ORAL | Status: DC | PRN

## 2014-09-18 MED ORDER — CLONIDINE HCL 0.3 MG PO TABS: 0.3000 mg | ORAL_TABLET | Freq: Three times a day (TID) | ORAL | Status: DC

## 2014-09-18 MED ORDER — TORSEMIDE 20 MG PO TABS: 20.0000 mg | ORAL_TABLET | Freq: Every day | ORAL | Status: DC

## 2014-09-18 MED ORDER — VITAMIN B-12 1000 MCG PO TABS
1000.0000 ug | ORAL_TABLET | Freq: Every day | ORAL | Status: DC
  Administered 2014-09-18: 1000 ug via ORAL
  Filled 2014-09-18: qty 1

## 2014-09-18 MED ORDER — SODIUM CHLORIDE 0.9 % IJ SOLN
3.0000 mL | Freq: Two times a day (BID) | INTRAMUSCULAR | Status: DC
  Administered 2014-09-18: 3 mL via INTRAVENOUS

## 2014-09-18 MED ORDER — ALBUTEROL SULFATE (2.5 MG/3ML) 0.083% IN NEBU: 2.5000 mg | INHALATION_SOLUTION | Freq: Four times a day (QID) | RESPIRATORY_TRACT | Status: DC | PRN

## 2014-09-18 MED ORDER — PANTOPRAZOLE SODIUM 40 MG PO TBEC
40.0000 mg | DELAYED_RELEASE_TABLET | Freq: Two times a day (BID) | ORAL | Status: DC
  Administered 2014-09-18 (×2): 40 mg via ORAL
  Filled 2014-09-18 (×2): qty 1

## 2014-09-18 MED ORDER — MOMETASONE FURO-FORMOTEROL FUM 100-5 MCG/ACT IN AERO
2.0000 | INHALATION_SPRAY | Freq: Two times a day (BID) | RESPIRATORY_TRACT | Status: DC
  Administered 2014-09-18: 2 via RESPIRATORY_TRACT
  Filled 2014-09-18: qty 8.8

## 2014-09-18 MED ORDER — CLONIDINE HCL 0.2 MG PO TABS
0.2000 mg | ORAL_TABLET | Freq: Three times a day (TID) | ORAL | Status: DC
  Administered 2014-09-18: 0.2 mg via ORAL
  Filled 2014-09-18: qty 1

## 2014-09-18 MED ORDER — NITROGLYCERIN 0.4 MG SL SUBL
0.4000 mg | SUBLINGUAL_TABLET | SUBLINGUAL | Status: DC | PRN
Start: 2014-09-18 — End: 2014-09-18

## 2014-09-18 MED ORDER — PRAVASTATIN SODIUM 40 MG PO TABS: 80.0000 mg | ORAL_TABLET | Freq: Every evening | ORAL | Status: DC

## 2014-09-18 MED ORDER — HYDRALAZINE HCL 50 MG PO TABS
75.0000 mg | ORAL_TABLET | Freq: Three times a day (TID) | ORAL | Status: DC
  Administered 2014-09-18: 75 mg via ORAL
  Filled 2014-09-18 (×2): qty 1

## 2014-09-18 MED ORDER — BUDESONIDE 0.25 MG/2ML IN SUSP: 0.2500 mg | Freq: Every day | RESPIRATORY_TRACT | Status: DC

## 2014-09-18 MED ORDER — LOSARTAN POTASSIUM 50 MG PO TABS: 100.0000 mg | ORAL_TABLET | Freq: Every evening | ORAL | Status: DC

## 2014-09-18 MED ORDER — CHOLESTYRAMINE 4 G PO PACK
4.0000 g | PACK | ORAL | Status: DC
  Filled 2014-09-18: qty 1

## 2014-09-18 MED ORDER — ONDANSETRON HCL 4 MG/2ML IJ SOLN: 4.0000 mg | Freq: Four times a day (QID) | INTRAMUSCULAR | Status: DC | PRN

## 2014-09-18 MED ORDER — MONTELUKAST SODIUM 10 MG PO TABS
10.0000 mg | ORAL_TABLET | Freq: Every day | ORAL | Status: DC
  Administered 2014-09-18: 10 mg via ORAL
  Filled 2014-09-18: qty 1

## 2014-09-18 MED ORDER — LATANOPROST 0.005 % OP SOLN
1.0000 [drp] | Freq: Every day | OPHTHALMIC | Status: DC
  Filled 2014-09-18: qty 2.5

## 2014-09-18 MED ORDER — ONDANSETRON HCL 4 MG PO TABS: 4.0000 mg | ORAL_TABLET | Freq: Four times a day (QID) | ORAL | Status: DC | PRN

## 2014-09-18 MED ORDER — CLONIDINE HCL 0.3 MG PO TABS
0.3000 mg | ORAL_TABLET | Freq: Two times a day (BID) | ORAL | Status: DC
  Administered 2014-09-18: 0.3 mg via ORAL
  Filled 2014-09-18: qty 1

## 2014-09-18 MED ORDER — ISOSORBIDE MONONITRATE ER 60 MG PO TB24
60.0000 mg | ORAL_TABLET | Freq: Every day | ORAL | Status: DC
  Administered 2014-09-18: 60 mg via ORAL
  Filled 2014-09-18: qty 1

## 2014-09-18 MED ORDER — CLONIDINE HCL 0.3 MG PO TABS: 0.3000 mg | ORAL_TABLET | Freq: Two times a day (BID) | ORAL | Status: DC

## 2014-09-18 MED ORDER — IPRATROPIUM-ALBUTEROL 0.5-2.5 (3) MG/3ML IN SOLN: 3.0000 mL | Freq: Every day | RESPIRATORY_TRACT | Status: DC

## 2014-09-18 MED ORDER — POTASSIUM CHLORIDE CRYS ER 20 MEQ PO TBCR
20.0000 meq | EXTENDED_RELEASE_TABLET | Freq: Two times a day (BID) | ORAL | Status: DC
  Administered 2014-09-18: 20 meq via ORAL
  Filled 2014-09-18: qty 1

## 2014-09-18 NOTE — Consult Note (Signed)
CARDIOLOGY CONSULT NOTE   Patient ID: [name]. MRN: [id number] DOB/AGE: 1930/12/24 81 y.o.  Admit Date: 09/17/2014  Primary Physician: Gwendolyn Grant, MD  Primary Cardiologist   [place]   Clinical Summary [name] is a 47 y.o.male. He is followed by Dr. Percival Spanish. He saw [name] in the office in April, 2016. He has high blood pressure that has been difficult to control over time. He has a renal arterial Doppler scheduled in our office on June 21 according to his wife. There is also an echo to be done at that time to follow-up his systolic murmur. Yesterday he felt poorly and his blood pressure was elevated. He was admitted for further evaluation. His blood pressure is now under better control.  The patient has a history of coronary disease. He underwent bypass surgery in 2008. He had a PCI in 2012. With the current admission his troponins are normal. EKG shows no change. There is RSR prime in V1. His rhythm is stable. He is not having any recurrent chest discomfort. He hopes to go home today.  It has been documented that the patient does become anxious about his blood pressure. In addition both he and his wife have been frustrated that he has had such variation in his blood pressure. This is why renal artery Doppler has been ordered. His renal function is normal.  There is a history of paroxysmal atrial fibrillation. He has been in sinus recently. He is anticoagulated. The decision about this will have to be followed carefully due to the increased risk of bleeding from his intermittent blood pressure spikes.   Allergies  Allergen Reactions  . Ace Inhibitors Other (See Comments)    Severe asthma (COPD)  . Other Other (See Comments)    Maple trees, allergy symptoms  . Codeine Nausea And Vomiting  . Lasix [Furosemide] Itching    Medications Scheduled Medications: . apixaban  5 mg Oral BID  . budesonide  0.25 mg Nebulization QHS  . cholestyramine  4 g Oral Q24H  .  cloNIDine  0.3 mg Oral 3 times per day  . diltiazem  120 mg Oral BID  . hydrALAZINE  75 mg Oral TID  . ipratropium-albuterol  3 mL Nebulization QHS  . isosorbide mononitrate  60 mg Oral Daily  . latanoprost  1 drop Both Eyes QHS  . LORazepam  0.5 mg Oral 3 times per day  . losartan  100 mg Oral QPM  . mometasone-formoterol  2 puff Inhalation BID  . montelukast  10 mg Oral QHS  . nebivolol  10 mg Oral Daily  . pantoprazole  40 mg Oral BID  . potassium chloride SA  20 mEq Oral BID  . pravastatin  80 mg Oral QPM  . predniSONE  10 mg Oral Q breakfast  . sodium chloride  3 mL Intravenous Q12H  . vitamin B-12  1,000 mcg Oral Daily     Infusions:     PRN Medications:  acetaminophen **OR** acetaminophen, albuterol, hydrALAZINE, nitroGLYCERIN, ondansetron **OR** ondansetron (ZOFRAN) IV, torsemide   Past Medical History  Diagnosis Date  . CORONARY HEART DISEASE     a. s/p CABG;  b.  cath 06/27/10: S-Dx occluded, S-PDA 80-90% (tx with PCI); S-OM ok, L-LAD ok;  EF 65-70%  c.  s/p Promus DES to S-PDA 06/2010;   d. Myoview 8/12: low risk  . FIBRILLATION, ATRIAL     post op; ?documented during hosp. 06/2010  . SLEEP APNEA 09/2001  NPSG AHI 22/HR  . ALLERGIC RHINITIS   . ASTHMA   . Depression   . GERD     with HH, hx esophageal stricture  . DYSLIPIDEMIA   . HYPERTENSION     Echo 3/12: EF 55-60%; mod LVH; mild AS/AI; LAE; PASP 38; mild pulmo HTN  . Personal history of alcoholism   . Diverticulosis   . Benign liver cyst   . Skin cancer     L forearm  . Cataract     surgery to both eyes  . Esophageal stricture   . Hiatal hernia   . Pneumonia 04/2013 hosp  . Shortness of breath   . Allergy   . Anxiety   . Arthritis   . CHF (congestive heart failure)   . COPD (chronic obstructive pulmonary disease)   . Glaucoma   . Osteoporosis   . History of heart attack     Past Surgical History  Procedure Laterality Date  . Coronary artery bypass graft  02/2007  . Tonsillectomy    .  Coronary angioplasty with stent placement  07/2010  . Cataract extraction, bilateral  2007  . Hernia repair  unsure ?60's  . Cholecystectomy  02/21/2011    Procedure: LAPAROSCOPIC CHOLECYSTECTOMY WITH INTRAOPERATIVE CHOLANGIOGRAM;  Surgeon: Pedro Earls, MD;  Location: [facility];  Service: General;  Laterality: N/A;    Family History  Problem Relation Age of Onset  . COPD Sister   . Asthma Sister   . Emphysema Sister   . Hypertension Sister   . Hyperlipidemia Sister   . Hypertension Mother   . Heart disease Mother   . Hyperlipidemia Mother   . Colon cancer Neg Hx   . Heart disease Brother   . Heart disease Father   . Hyperlipidemia Father   . Hypertension Father     Social History Mr. Ly reports that he quit smoking about 35 years ago. His smoking use included Cigarettes. He has a 120 pack-year smoking history. He has never used smokeless tobacco. Mr. Eid reports that he does not drink alcohol.  Review of Systems  Patient denies fever, chills, headache, sweats, rash, change in vision, change in hearing, cough, nausea or vomiting, urinary symptoms. All other systems are reviewed and are negative.  Physical Examination Blood pressure 167/66, pulse 82, temperature 98.7 F (37.1 C), temperature source Oral, resp. rate 16, height 5\' 1"  (1.549 m), weight 144 lb 9.6 oz (65.59 kg), SpO2 94 %.  Intake/Output Summary (Last 24 hours) at 09/18/14 0823 Last data filed at 09/18/14 0112  Gross per 24 hour  Intake      0 ml  Output   1295 ml  Net  -1295 ml    Patient is oriented to person time and place. Affect is normal. He is here with his wife at his bedside. Head is atraumatic. Sclera and conjunctiva are normal. There is no jugular venous distention. Lungs are clear. Respiratory effort is nonlabored. Cardiac exam reveals an S1 and S2. There is a systolic murmur. The abdomen is soft. There is no peripheral edema. There are no musculoskeletal deformities. There are no skin rashes.  Neurologic is grossly intact.  Prior Cardiac Testing/Procedures  Lab Results  Basic Metabolic Panel:  Recent Labs Lab 09/17/14 2200 09/18/14 0705  NA 137 140  K 4.1 4.4  CL 100* 106  CO2 24 26  GLUCOSE 99 100*  BUN 18 14  CREATININE 0.95 0.91  CALCIUM 9.2 8.6*    Liver Function Tests:  Recent  Labs Lab 09/18/14 0705  AST 35  ALT 17  ALKPHOS 54  BILITOT 2.0*  PROT 5.9*  ALBUMIN 3.5    CBC:  Recent Labs Lab 09/17/14 2200 09/18/14 0705  WBC 10.6* 7.8  NEUTROABS 8.7* 5.1  HGB 14.4 13.3  HCT 43.4 40.2  MCV 89.7 89.3  PLT 211 179    Cardiac Enzymes:  Recent Labs Lab 09/18/14 0212 09/18/14 0705  TROPONINI <0.03 <0.03    BNP: Invalid input(s): POCBNP   Radiology: Dg Chest Port 1 View  09/18/2014   CLINICAL DATA:  Chest pain with hypertension.  EXAM: PORTABLE CHEST - 1 VIEW  COMPARISON:  07/20/2014.  FINDINGS: Cardiomegaly. Prior CABG. No active infiltrates or failure. No effusion or pneumothorax. Negative osseous structures.  IMPRESSION: Stable chest.  No active findings.   Electronically Signed   By: Rolla Flatten M.D.   On: 09/18/2014 00:19     ECG:  I have reviewed current and old EKGs. There is RSR prime in V1. There is no acute change. There is no change from the past.  Telemetry:   I have personally reviewed telemetry today September 18, 2014. There is normal sinus rhythm.   Impression and Recommendations    PAF (paroxysmal atrial fibrillation)     There is history of paroxysmal atrial fibrillation. He is holding sinus. He is anticoagulated. The decision about anticoagulation will have to be reviewed on a regular basis because of his intermittent high blood pressure.    Hypertensive urgency     Blood pressure is improved. I would suggest no further in hospital workup. I would also suggest that he go home on the same medications that he came in on. I would recommend that he have early follow-up in the cardiology office some time during next week.  Rosaria Ferries, PA-C is working in the hospital today and can help coordinate this follow-up office visit. Please contact her to help you with this. The patient should keep his appointment for his scheduled renal ultrasound and echo that his wife says is scheduled for June 21. He is stable to go home during the day today from the cardiology viewpoint.  Chest pain   CAD S/P S-PDA DES 2012     The patient has known CABG in 2008 and an intervention in 2012. At this point there is no evidence of an acute coronary syndrome. No further ischemia workup in the hospital at this time.    Anxiety about health     It is well documented in the record that the patient and his wife have been very concerned about the patient's blood pressure over time. They are frustrated that this problem cannot be solved definitively. We need to continue to work with them to do everything possible to try to help with his evaluation. This is being done.  Daryel November, MD 09/18/2014, 8:23 AM

## 2014-09-18 NOTE — Discharge Summary (Signed)
Physician Discharge Summary  [name]. UMP:[id number] DOB: May 20, 1930 DOA: 09/17/2014  PCP: Gwendolyn Grant, MD  Admit date: 09/17/2014 Discharge date: 09/18/2014  Time spent: 25 minutes  Recommendations for Outpatient Follow-up:  1. Follow up with cardiology in 1 week.  Discharge Diagnoses:  Active Problems:   Hypertensive urgency   PAF (paroxysmal atrial fibrillation)   Chest pain   CAD S/P S-PDA DES 2012   Anxiety about health   Discharge Condition: stable  Diet recommendation: low sodium  Filed Weights   09/18/14 0048  Weight: 65.59 kg (144 lb 9.6 oz)    History of present illness:  81 y.o. male history of CAD status post stenting and CABG last Myoview was done in August 2012, atrial fibrillation, hypertension, OSA, asthma was referred to the ER from the urgent care center after patient was found to have uncontrolled blood pressure. Patient was in the ER last night as patient's wife was in the ER. This morning while walking towards his car patient of shortness of breath and retrosternal chest pressure which lasted for around 15 minutes and got relieved by nitroglycerin. Patient after reaching home was found to have elevated blood pressure which remained sustained. Patient had gone to the urgent care and was found to have elevated blood pressure and was referred to the ER. In the ER patient blood pressure was found to be more than 400 systolic for which patient was given hydralazine and admitted for further management as patient also had some chest pain early in the morning. Cardiac markers and EKG were unremarkable. Patient is chest pain-free at this time. Patient has been having issues with blood pressure for last few months and had been following with cardiologist to strain to have renal Doppler studies done as outpatient.   Hospital Course:  HTN Urgency: - his blood pressure medication were restarted and started on IV hydralazine as needed. His Bp improved significantly  into the 140's, which bring up the question of none complaince. He relates he only take his diuretics as needed, when he is not leaving the house. - change his clonidine to 3 times a  Day and torsemide to daily as he has not been taking it regularly only PRN. - cardiology consulted recommend to cont regimen and follow up with them as an outpatient. - will renal doppler as an outpatient. - I have instructed him not to check his BP 3 times a day, he should check it once a day.  Atypical chest pain: troponin's negative, cardiology consulted recommended follow up with them as an outpatient.'  PAF: - no changes made to his medications.  Procedures:  CXR  Consultations:  cardiology  Discharge Exam: Filed Vitals:   09/18/14 0500  BP: 167/66  Pulse: 82  Temp: 98.7 F (37.1 C)  Resp:     General: A&O x3 Cardiovascular: RRR Respiratory: good air movement CTA B/L  Discharge Instructions   Discharge Instructions    Diet - low sodium heart healthy    Complete by:  As directed      Increase activity slowly    Complete by:  As directed           Current Discharge Medication List    CONTINUE these medications which have CHANGED   Details  torsemide (DEMADEX) 20 MG tablet Take 1 tablet (20 mg total) by mouth daily. Qty: 30 tablet, Refills: 0      CONTINUE these medications which have NOT CHANGED   Details  albuterol (PROVENTIL HFA;VENTOLIN  HFA) 108 (90 BASE) MCG/ACT inhaler Inhale 2 puffs into the lungs every 6 (six) hours as needed for wheezing or shortness of breath. Qty: 8 g, Refills: 5    budesonide (PULMICORT) 0.25 MG/2ML nebulizer solution Take 0.25 mg by nebulization at bedtime. For asthma    calcium-vitamin D (OSCAL WITH D) 500-200 MG-UNIT per tablet Take 1 tablet by mouth 2 (two) times daily.    cholestyramine (QUESTRAN) 4 G packet take 1 packet by mouth twice a day if needed for LOOSE STOOLS Qty: 60 each, Refills: 2    cloNIDine (CATAPRES) 0.3 MG tablet Take  1 tablet (0.3 mg total) by mouth 2 (two) times daily. Qty: 60 tablet, Refills: 11   Associated Diagnoses: Essential hypertension    denosumab (PROLIA) 60 MG/ML SOLN injection Inject 60 mg into the skin every 6 (six) months. Administer in upper arm, thigh, or abdomen Qty: 1 mL, Refills: 5   Associated Diagnoses: Osteoporosis    diltiazem (CARDIZEM CD) 120 MG 24 hr capsule Take 1 capsule (120 mg total) by mouth 2 (two) times daily. Qty: 60 capsule, Refills: 5    DULERA 100-5 MCG/ACT AERO inhale 2 puffs by mouth once daily (THEN RINSE MOUTH AFTER USE) Qty: 17.6 g, Refills: 5    ELIQUIS 5 MG TABS tablet take 1 tablet by mouth twice a day Qty: 60 tablet, Refills: 5    ferrous sulfate 325 (65 FE) MG tablet take 1 tablet by mouth three times a day with food Qty: 90 tablet, Refills: 3    hydrALAZINE (APRESOLINE) 50 MG tablet Take 1.5 tablets (75 mg total) by mouth 3 (three) times daily. Qty: 90 tablet, Refills: 5    ipratropium-albuterol (DUONEB) 0.5-2.5 (3) MG/3ML SOLN Take 3 mLs by nebulization at bedtime. Qty: 360 mL, Refills: 6    isosorbide mononitrate (IMDUR) 60 MG 24 hr tablet Take 1 tablet (60 mg total) by mouth daily. Qty: 90 tablet, Refills: 3    latanoprost (XALATAN) 0.005 % ophthalmic solution Place 1 drop into both eyes at bedtime.     LORazepam (ATIVAN) 0.5 MG tablet Take 1 tablet (0.5 mg total) by mouth 3 (three) times daily. Qty: 90 tablet, Refills: 5    losartan (COZAAR) 100 MG tablet Take 1 tablet (100 mg total) by mouth every evening. Qty: 30 tablet, Refills: 11    LYSINE PO Take 1 tablet by mouth daily at 12 noon.    montelukast (SINGULAIR) 10 MG tablet Take 10 mg by mouth at bedtime.    nebivolol (BYSTOLIC) 10 MG tablet Take 1 tablet (10 mg total) by mouth daily. Qty: 30 tablet, Refills: 6    nitroGLYCERIN (NITROSTAT) 0.4 MG SL tablet Place 1 tablet (0.4 mg total) under the tongue every 5 (five) minutes as needed for chest pain. Qty: 25 tablet, Refills: 0     pantoprazole (PROTONIX) 40 MG tablet take 1 tablet by mouth twice a day Qty: 60 tablet, Refills: 0    potassium chloride SA (K-DUR,KLOR-CON) 20 MEQ tablet take 1 tablet by mouth twice a day Qty: 60 tablet, Refills: 1    pravastatin (PRAVACHOL) 80 MG tablet take 1 tablet by mouth every evening Qty: 30 tablet, Refills: 2    predniSONE (STERAPRED UNI-PAK 21 TAB) 10 MG (21) TBPK tablet Take 1 tablet (10 mg total) by mouth daily. Or taper as directed Qty: 21 tablet, Refills: 0    Probiotic Product (PHILLIPS COLON HEALTH PO) Take 1 capsule by mouth every evening.     RA VITAMIN B-12  TR 1000 MCG TBCR take 1 tablet by mouth once daily Qty: 30 tablet, Refills: 11       Allergies  Allergen Reactions  . Ace Inhibitors Other (See Comments)    Severe asthma (COPD)  . Other Other (See Comments)    Maple trees, allergy symptoms  . Codeine Nausea And Vomiting  . Lasix [Furosemide] Itching   Follow-up Information    Follow up with Minus Breeding, MD In 1 week.   Specialty:  Cardiology   Why:  hospital follow up   Contact information:   [place] [address] [phone]        The results of significant diagnostics from this hospitalization (including imaging, microbiology, ancillary and laboratory) are listed below for reference.    Significant Diagnostic Studies: Dg Chest Port 1 View  09/18/2014   CLINICAL DATA:  Chest pain with hypertension.  EXAM: PORTABLE CHEST - 1 VIEW  COMPARISON:  07/20/2014.  FINDINGS: Cardiomegaly. Prior CABG. No active infiltrates or failure. No effusion or pneumothorax. Negative osseous structures.  IMPRESSION: Stable chest.  No active findings.   Electronically Signed   By: Rolla Flatten M.D.   On: 09/18/2014 00:19    Microbiology: No results found for this or any previous visit (from the past 240 hour(s)).   Labs: Basic Metabolic Panel:  Recent Labs Lab 09/17/14 2200 09/18/14 0705  NA 137 140  K 4.1 4.4  CL 100* 106   CO2 24 26  GLUCOSE 99 100*  BUN 18 14  CREATININE 0.95 0.91  CALCIUM 9.2 8.6*   Liver Function Tests:  Recent Labs Lab 09/18/14 0705  AST 35  ALT 17  ALKPHOS 54  BILITOT 2.0*  PROT 5.9*  ALBUMIN 3.5   No results for input(s): LIPASE, AMYLASE in the last 168 hours. No results for input(s): AMMONIA in the last 168 hours. CBC:  Recent Labs Lab 09/17/14 2200 09/18/14 0705  WBC 10.6* 7.8  NEUTROABS 8.7* 5.1  HGB 14.4 13.3  HCT 43.4 40.2  MCV 89.7 89.3  PLT 211 179   Cardiac Enzymes:  Recent Labs Lab 09/18/14 0212 09/18/14 0705  TROPONINI <0.03 <0.03   BNP: BNP (last 3 results)  Recent Labs  09/17/14 2200  BNP 249.5*    ProBNP (last 3 results) No results for input(s): PROBNP in the last 8760 hours.  CBG: No results for input(s): GLUCAP in the last 168 hours.     Signed:  Charlynne Cousins  [facility] 09/18/2014, 9:16 AM

## 2014-09-18 NOTE — Progress Notes (Signed)
Pt's BP read 148/62 at 0200 and 167/66 this morning, pt's clonidine 0.3mg  held and tab hydralazine 75mg  given this morning, NP Forrest Moron (on call) paged and made aware, pt calm in bed with wife at bedside, will continue to monitor. [place]As[place], Emanuelle Hammerstrom Efe

## 2014-09-18 NOTE — Care Management Note (Signed)
Case Management Note  Patient Details  Name: [name]. MRN: [id number] Date of Birth: 09/17/1930  Subjective/Objective:        HTN           Action/Plan: Home with wife  Expected Discharge Date:   09/18/2014               Expected Discharge Plan:  Home/Self Care  In-House Referral:     Discharge planning Services  CM Consult  Status of Service:  Completed, signed off  Medicare Important Message Given:  Yes Date Medicare IM Given:  09/18/14 Medicare IM give by:  Jonnie Finner RN CCM  Date Additional Medicare IM Given:    Additional Medicare Important Message give by:     If discussed at [place] of Stay Meetings, dates discussed:    Additional Comments: CM review chart. No NCM needs identified. Spoke to pt and Eliquis is not new medication. He has used 30 day free trial in the past.   Erenest Rasher, RN 09/18/2014, 11:36 AM

## 2014-09-18 NOTE — H&P (Signed)
[facility] History and Physical  [name]. BEM:[id number] DOB: [birth year] DOA: 09/17/2014  Referring physician: Dr.Pollino. PCP: Gwendolyn Grant, MD  Specialists: Dr.Hochrein.  Chief Complaint: Elevated blood pressure and chest pain.  HPI: [name]. is a 81 y.o. male history of CAD status post stenting and CABG last Myoview was done in August 2012, atrial fibrillation, hypertension, OSA, asthma was referred to the ER from the urgent care center after patient was found to have uncontrolled blood pressure. Patient was in the ER last night as patient's wife was in the ER. This morning while walking towards his car patient of shortness of breath and retrosternal chest pressure which lasted for around 15 minutes and got relieved by nitroglycerin. Patient after reaching home was found to have elevated blood pressure which remained sustained. Patient had gone to the urgent care and was found to have elevated blood pressure and was referred to the ER. In the ER patient blood pressure was found to be more than 071 systolic for which patient was given hydralazine and admitted for further management as patient also had some chest pain early in the morning. Cardiac markers and EKG were unremarkable. Patient is chest pain-free at this time. Patient has been having issues with blood pressure for last few months and had been following with cardiologist to strain to have renal Doppler studies done as outpatient.   Review of Systems: As presented in the history of presenting illness, rest negative.  Past Medical History  Diagnosis Date  . CORONARY HEART DISEASE     a. s/p CABG;  b.  cath 06/27/10: S-Dx occluded, S-PDA 80-90% (tx with PCI); S-OM ok, L-LAD ok;  EF 65-70%  c.  s/p Promus DES to S-PDA 06/2010;   d. Myoview 8/12: low risk  . FIBRILLATION, ATRIAL     post op; ?documented during hosp. 06/2010  . SLEEP APNEA 09/2001    NPSG AHI 22/HR  . ALLERGIC RHINITIS   . ASTHMA   . Depression    . GERD     with HH, hx esophageal stricture  . DYSLIPIDEMIA   . HYPERTENSION     Echo 3/12: EF 55-60%; mod LVH; mild AS/AI; LAE; PASP 38; mild pulmo HTN  . Personal history of alcoholism   . Diverticulosis   . Benign liver cyst   . Skin cancer     L forearm  . Cataract     surgery to both eyes  . Esophageal stricture   . Hiatal hernia   . Pneumonia 04/2013 hosp  . Shortness of breath   . Allergy   . Anxiety   . Arthritis   . CHF (congestive heart failure)   . COPD (chronic obstructive pulmonary disease)   . Glaucoma   . Osteoporosis   . History of heart attack    Past Surgical History  Procedure Laterality Date  . Coronary artery bypass graft  02/2007  . Tonsillectomy    . Coronary angioplasty with stent placement  07/2010  . Cataract extraction, bilateral  2007  . Hernia repair  unsure ?60's  . Cholecystectomy  02/21/2011    Procedure: LAPAROSCOPIC CHOLECYSTECTOMY WITH INTRAOPERATIVE CHOLANGIOGRAM;  Surgeon: Pedro Earls, MD;  Location: [facility];  Service: General;  Laterality: N/A;   Social History:  reports that he quit smoking about 35 years ago. His smoking use included Cigarettes. He has a 120 pack-year smoking history. He has never used smokeless tobacco. He reports that he does not drink alcohol or  use illicit drugs. Where does patient live at home. Can patient participate in ADLs? Yes.  Allergies  Allergen Reactions  . Ace Inhibitors Other (See Comments)    Severe asthma (COPD)  . Other Other (See Comments)    Maple trees, allergy symptoms  . Codeine Nausea And Vomiting  . Lasix [Furosemide] Itching    Family History:  Family History  Problem Relation Age of Onset  . COPD Sister   . Asthma Sister   . Emphysema Sister   . Hypertension Sister   . Hyperlipidemia Sister   . Hypertension Mother   . Heart disease Mother   . Hyperlipidemia Mother   . Colon cancer Neg Hx   . Heart disease Brother   . Heart disease Father   . Hyperlipidemia Father    . Hypertension Father       Prior to Admission medications   Medication Sig Start Date End Date Taking? Authorizing Provider  albuterol (PROVENTIL HFA;VENTOLIN HFA) 108 (90 BASE) MCG/ACT inhaler Inhale 2 puffs into the lungs every 6 (six) hours as needed for wheezing or shortness of breath. 06/24/14  Yes Deneise Lever, MD  budesonide (PULMICORT) 0.25 MG/2ML nebulizer solution Take 0.25 mg by nebulization at bedtime. For asthma   Yes Historical Provider, MD  calcium-vitamin D (OSCAL WITH D) 500-200 MG-UNIT per tablet Take 1 tablet by mouth 2 (two) times daily.   Yes Historical Provider, MD  cholestyramine Lucrezia Starch) 4 G packet take 1 packet by mouth twice a day if needed for LOOSE STOOLS 09/09/14  Yes Irene Shipper, MD  cloNIDine (CATAPRES) 0.3 MG tablet Take 1 tablet (0.3 mg total) by mouth 2 (two) times daily. 04/07/14  Yes Rowe Clack, MD  denosumab (PROLIA) 60 MG/ML SOLN injection Inject 60 mg into the skin every 6 (six) months. Administer in upper arm, thigh, or abdomen 06/12/12  Yes Rowe Clack, MD  diltiazem (CARDIZEM CD) 120 MG 24 hr capsule Take 1 capsule (120 mg total) by mouth 2 (two) times daily. 04/22/14  Yes Rowe Clack, MD  DULERA 100-5 MCG/ACT AERO inhale 2 puffs by mouth once daily (THEN RINSE MOUTH AFTER USE) 07/14/14  Yes Rowe Clack, MD  ELIQUIS 5 MG TABS tablet take 1 tablet by mouth twice a day 08/10/14  Yes Minus Breeding, MD  ferrous sulfate 325 (65 FE) MG tablet take 1 tablet by mouth three times a day with food 02/06/14  Yes Minus Breeding, MD  hydrALAZINE (APRESOLINE) 50 MG tablet Take 1.5 tablets (75 mg total) by mouth 3 (three) times daily. 07/20/14  Yes Nishant Dhungel, MD  ipratropium-albuterol (DUONEB) 0.5-2.5 (3) MG/3ML SOLN Take 3 mLs by nebulization at bedtime. 09/09/14  Yes Rowe Clack, MD  isosorbide mononitrate (IMDUR) 60 MG 24 hr tablet Take 1 tablet (60 mg total) by mouth daily. 04/22/14  Yes Rowe Clack, MD  latanoprost  (XALATAN) 0.005 % ophthalmic solution Place 1 drop into both eyes at bedtime.    Yes Historical Provider, MD  LORazepam (ATIVAN) 0.5 MG tablet Take 1 tablet (0.5 mg total) by mouth 3 (three) times daily. 09/10/14  Yes Rowe Clack, MD  losartan (COZAAR) 100 MG tablet Take 1 tablet (100 mg total) by mouth every evening. 09/09/14  Yes Rowe Clack, MD  LYSINE PO Take 1 tablet by mouth daily at 12 noon.   Yes Historical Provider, MD  montelukast (SINGULAIR) 10 MG tablet Take 10 mg by mouth at bedtime.   Yes Historical Provider,  MD  nebivolol (BYSTOLIC) 10 MG tablet Take 1 tablet (10 mg total) by mouth daily. 06/30/14  Yes Minus Breeding, MD  nitroGLYCERIN (NITROSTAT) 0.4 MG SL tablet Place 1 tablet (0.4 mg total) under the tongue every 5 (five) minutes as needed for chest pain. 07/13/14  Yes Minus Breeding, MD  pantoprazole (PROTONIX) 40 MG tablet take 1 tablet by mouth twice a day 02/24/14  Yes Minus Breeding, MD  potassium chloride SA (K-DUR,KLOR-CON) 20 MEQ tablet take 1 tablet by mouth twice a day 07/31/14  Yes Minus Breeding, MD  pravastatin (PRAVACHOL) 80 MG tablet take 1 tablet by mouth every evening 08/26/14  Yes Minus Breeding, MD  predniSONE (STERAPRED UNI-PAK 21 TAB) 10 MG (21) TBPK tablet Take 1 tablet (10 mg total) by mouth daily. Or taper as directed 09/09/14  Yes Rowe Clack, MD  Probiotic Product (Wise PO) Take 1 capsule by mouth every evening.    Yes Historical Provider, MD  RA VITAMIN B-12 TR 1000 MCG TBCR take 1 tablet by mouth once daily 01/23/14  Yes Rowe Clack, MD  torsemide (DEMADEX) 20 MG tablet Take 1 tablet (20 mg total) by mouth 3 (three) times daily. Patient taking differently: Take 20 mg by mouth 3 (three) times daily as needed (fluid retention).  09/09/14  Yes Rowe Clack, MD    Physical Exam: Filed Vitals:   09/18/14 0000 09/18/14 0007 09/18/14 0015 09/18/14 0048  BP: 194/58 194/58 194/68 193/63  Pulse: 89  91 85  Temp:    98.2  F (36.8 C)  TempSrc:    Oral  Resp: 19  17 16   Height:    5\' 1"  (1.549 m)  Weight:    65.59 kg (144 lb 9.6 oz)  SpO2: 96%  98% 98%     General:  Moderately built and nourished.  Eyes: Anicteric no pallor.  ENT: No discharge from the ears eyes nose and mouth.  Neck: No mass felt.  Cardiovascular: S1 and S2 heard.  Respiratory: No rhonchi or crepitations.  Abdomen: Soft nontender bowel sounds present.  Skin: No rash.  Musculoskeletal: No edema.  Psychiatric: Appears normal.  Neurologic: Alert awake oriented to time place and person. Moves all extremities 5 x 5.  Labs on Admission:  Basic Metabolic Panel:  Recent Labs Lab 09/17/14 2200  NA 137  K 4.1  CL 100*  CO2 24  GLUCOSE 99  BUN 18  CREATININE 0.95  CALCIUM 9.2   Liver Function Tests: No results for input(s): AST, ALT, ALKPHOS, BILITOT, PROT, ALBUMIN in the last 168 hours. No results for input(s): LIPASE, AMYLASE in the last 168 hours. No results for input(s): AMMONIA in the last 168 hours. CBC:  Recent Labs Lab 09/17/14 2200  WBC 10.6*  NEUTROABS 8.7*  HGB 14.4  HCT 43.4  MCV 89.7  PLT 211   Cardiac Enzymes: No results for input(s): CKTOTAL, CKMB, CKMBINDEX, TROPONINI in the last 168 hours.  BNP (last 3 results)  Recent Labs  09/17/14 2200  BNP 249.5*    ProBNP (last 3 results) No results for input(s): PROBNP in the last 8760 hours.  CBG: No results for input(s): GLUCAP in the last 168 hours.  Radiological Exams on Admission: Dg Chest Port 1 View  09/18/2014   CLINICAL DATA:  Chest pain with hypertension.  EXAM: PORTABLE CHEST - 1 VIEW  COMPARISON:  07/20/2014.  FINDINGS: Cardiomegaly. Prior CABG. No active infiltrates or failure. No effusion or pneumothorax. Negative osseous structures.  IMPRESSION:  Stable chest.  No active findings.   Electronically Signed   By: Rolla Flatten M.D.   On: 09/18/2014 00:19    EKG: Independently reviewed. Normal sinus rhythm with poor R-wave  progression.  Assessment/Plan Active Problems:   PAF (paroxysmal atrial fibrillation)   Hypertensive urgency   CAD S/P S-PDA DES 2012   Chest pain   Pain in the chest   1. Hypertensive urgency - at this time I have placed patient on when necessary IV hydralazine and increase patient's clonidine dose from 0.3 mg twice a day to 3 times a day. Continue other medications. And follow blood pressure trends. 2. Chest pain - presently chest pain-free. May be precipitated by uncontrolled blood pressure but given the history of CAD status post stenting and CABG at this time we will cycle cardiac markers and rule out ACS. Patient is on Apixaban. Continue statins and Cardizem. 3. Atrial fibrillation presents in sinus rhythm - continue liquids and Cardizem. Rate control. 4. OSA on CPAP. 5. History of CHF last EF measured was 55-60% on November 2014 - continue Lasix. 6. Hyperlipidemia on statins. 7. History of asthma present or wheezing continue inhalers.   DVT Prophylaxis Lovenox.  Code Status: Full code.  Family Communication: Discussed patient's family and patient.  Disposition Plan: Admit for observation.    Yoselin Amerman N. [facility] Pager [phone].  If 7PM-7AM, please contact night-coverage [email or web address] Password [email or web address] 09/18/2014, 1:34 AM

## 2014-09-18 NOTE — Progress Notes (Signed)
Pt admitted from ED with c/o of high BP, pt alert and oriented, denies any pain at this time,was settled in bed with call light within patient's reach, admit doc paged for orders, will however continue to monitor. Obasogie-Asidi, Lindora Alviar Efe

## 2014-09-28 ENCOUNTER — Other Ambulatory Visit: Payer: Self-pay | Admitting: Cardiology

## 2014-09-28 DIAGNOSIS — G4730 Sleep apnea, unspecified: Secondary | ICD-10-CM | POA: Diagnosis not present

## 2014-09-28 DIAGNOSIS — G4733 Obstructive sleep apnea (adult) (pediatric): Secondary | ICD-10-CM | POA: Diagnosis not present

## 2014-09-28 NOTE — Telephone Encounter (Signed)
E-sent  pharmacy

## 2014-09-29 ENCOUNTER — Ambulatory Visit (HOSPITAL_COMMUNITY)
Admission: RE | Admit: 2014-09-29 | Discharge: 2014-09-29 | Disposition: A | Discharge status: Home / Self Care | Payer: Medicare Other | Source: Ambulatory Visit | Attending: Cardiovascular Disease | Admitting: Cardiovascular Disease

## 2014-09-29 ENCOUNTER — Ambulatory Visit (HOSPITAL_BASED_OUTPATIENT_CLINIC_OR_DEPARTMENT_OTHER)
Admission: RE | Admit: 2014-09-29 | Discharge: 2014-09-29 | Disposition: A | Discharge status: Home / Self Care | Payer: Medicare Other | Source: Ambulatory Visit | Attending: Cardiovascular Disease | Admitting: Cardiovascular Disease

## 2014-09-29 DIAGNOSIS — I10 Essential (primary) hypertension: Secondary | ICD-10-CM | POA: Diagnosis not present

## 2014-09-29 DIAGNOSIS — R011 Cardiac murmur, unspecified: Secondary | ICD-10-CM | POA: Diagnosis not present

## 2014-10-08 ENCOUNTER — Other Ambulatory Visit: Payer: Self-pay

## 2014-10-08 ENCOUNTER — Telehealth: Payer: Self-pay

## 2014-10-08 MED ORDER — LOSARTAN POTASSIUM 100 MG PO TABS: 100.0000 mg | ORAL_TABLET | Freq: Every evening | ORAL | Status: DC

## 2014-10-08 NOTE — Telephone Encounter (Signed)
TC regarding AWV and spoke with spouse; stated the patient can come in at [place] on 7/11 prior to seeing Dr. [name]

## 2014-10-09 ENCOUNTER — Ambulatory Visit (INDEPENDENT_AMBULATORY_CARE_PROVIDER_SITE_OTHER): Payer: Medicare Other | Admitting: Family Medicine

## 2014-10-09 ENCOUNTER — Ambulatory Visit (INDEPENDENT_AMBULATORY_CARE_PROVIDER_SITE_OTHER): Payer: Medicare Other

## 2014-10-09 VITALS — BP 182/68 | HR 80 | Temp 98.5°F | Resp 20 | Ht 61.5 in | Wt 152.0 lb

## 2014-10-09 DIAGNOSIS — R0600 Dyspnea, unspecified: Secondary | ICD-10-CM | POA: Diagnosis not present

## 2014-10-09 DIAGNOSIS — J441 Chronic obstructive pulmonary disease with (acute) exacerbation: Secondary | ICD-10-CM

## 2014-10-09 DIAGNOSIS — R062 Wheezing: Secondary | ICD-10-CM

## 2014-10-09 DIAGNOSIS — I350 Nonrheumatic aortic (valve) stenosis: Secondary | ICD-10-CM | POA: Diagnosis not present

## 2014-10-09 DIAGNOSIS — J4541 Moderate persistent asthma with (acute) exacerbation: Secondary | ICD-10-CM

## 2014-10-09 DIAGNOSIS — I509 Heart failure, unspecified: Secondary | ICD-10-CM | POA: Diagnosis not present

## 2014-10-09 DIAGNOSIS — I10 Essential (primary) hypertension: Secondary | ICD-10-CM

## 2014-10-09 LAB — POCT CBC
GRANULOCYTE PERCENT: 70.2 % (ref 37–80)
HEMATOCRIT: 35.6 % — AB (ref 43.5–53.7)
Hemoglobin: 11.9 g/dL — AB (ref 14.1–18.1)
Lymph, poc: 1.7 (ref 0.6–3.4)
MCH, POC: 28.5 pg (ref 27–31.2)
MCHC: 33.5 g/dL (ref 31.8–35.4)
MCV: 85 fL (ref 80–97)
MID (CBC): 0.5 (ref 0–0.9)
MPV: 6.1 fL (ref 0–99.8)
POC Granulocyte: 5.3 (ref 2–6.9)
POC LYMPH PERCENT: 23 %L (ref 10–50)
POC MID %: 6.8 %M (ref 0–12)
Platelet Count, POC: 315 10*3/uL (ref 142–424)
RBC: 4.19 M/uL — AB (ref 4.69–6.13)
RDW, POC: 15.2 %
WBC: 7.5 10*3/uL (ref 4.6–10.2)

## 2014-10-09 LAB — BASIC METABOLIC PANEL
BUN: 19 mg/dL (ref 6–23)
CALCIUM: 8.8 mg/dL (ref 8.4–10.5)
CHLORIDE: 105 meq/L (ref 96–112)
CO2: 25 meq/L (ref 19–32)
Creat: 1 mg/dL (ref 0.50–1.35)
GLUCOSE: 113 mg/dL — AB (ref 70–99)
Potassium: 4.5 mEq/L (ref 3.5–5.3)
SODIUM: 141 meq/L (ref 135–145)

## 2014-10-09 LAB — BRAIN NATRIURETIC PEPTIDE: BRAIN NATRIURETIC PEPTIDE: 220 pg/mL — AB (ref 0.0–100.0)

## 2014-10-09 MED ORDER — PREDNISONE 20 MG PO TABS: ORAL_TABLET | ORAL | Status: DC

## 2014-10-09 MED ORDER — AMOXICILLIN 875 MG PO TABS: 875.0000 mg | ORAL_TABLET | Freq: Two times a day (BID) | ORAL | Status: DC

## 2014-10-09 MED ORDER — ALBUTEROL SULFATE (2.5 MG/3ML) 0.083% IN NEBU
2.5000 mg | INHALATION_SOLUTION | Freq: Once | RESPIRATORY_TRACT | Status: AC
  Administered 2014-10-09: 2.5 mg via RESPIRATORY_TRACT

## 2014-10-09 MED ORDER — IPRATROPIUM BROMIDE 0.02 % IN SOLN
0.5000 mg | Freq: Once | RESPIRATORY_TRACT | Status: AC
  Administered 2014-10-09: 0.5 mg via RESPIRATORY_TRACT

## 2014-10-09 NOTE — Patient Instructions (Addendum)
Take the prednisone 3 pills daily for 2 days, then 2 daily for 2 days, then 1 daily for 2 days, then one half daily for 4 days. This is best taken in the morning, after breakfast.  Take the amoxicillin one twice daily for infection  Take the torsemide twice daily for 3 days, then decrease back to your regular once daily  If short of breath go to the emergency room  Return here as needed

## 2014-10-09 NOTE — Progress Notes (Signed)
  Subjective:  Patient ID: [name]., male    DOB: 17-Sep-1930  Age: 81 y.o. MRN: [id number]  81 year old patient who has had a number of episodes of problems during the last year, has had to go to the hospital 5 times. He was in the hospital a couple weeks ago with poorly controlled blood pressure. He has done okay until 3 days ago and he has started breathing worse. He has a history of asthma and COPD and CHF. He says that he knows when he gets his way needs to go to the doctor and get more. In the past usually anabiotic's or steroids and had to be given him to get him clearing up. It has been many years since he quit smoking. He does have a history of aortic stenosis, mild to moderate, and is scheduled to see the cardiologist back in about a month. He has a nebulizer machine at home, but things haven't helped the last couple of days. He is coughing up some phlegm. He is not running fevers. The patient is married and lives with his wife. He drove himself here today. No chest pains.   Objective:   He is audibly wheezing. Throat clear. Neck supple without nodes. No visible JVD. His neck is supple. Chest has soft wheezing bilaterally without rales or rhonchi. Heart was regular has a murmur of aortic stenosis radiating up into the left carotid. Abdomen soft and nontender with no hepatosplenomegaly noted. No ankle edema. O2 saturation on room air is good.  Assessment & Plan:   Assessment: Wheezing Cough Dyspnea Portable controlled hypertension History of asthma Aortic stenosis  Plan: Check chest x-ray, CBC, BNP, BMP. Albuterol plus Atrovent nebulizer treatment  [facility] reading (PRIMARY) by  Dr. Linna Darner Increased markings perihilar and left lung as compared to last film. It has the appearance of exacerbation of COPD. Old heart surgery evidence.  Results for orders placed or performed in visit on 10/09/14  POCT CBC  Result Value Ref Range   WBC 7.5 4.6 - 10.2 K/uL   Lymph, poc 1.7 0.6 -  3.4   POC LYMPH PERCENT 23.0 10 - 50 %L   MID (cbc) 0.5 0 - 0.9   POC MID % 6.8 0 - 12 %M   POC Granulocyte 5.3 2 - 6.9   Granulocyte percent 70.2 37 - 80 %G   RBC 4.19 (A) 4.69 - 6.13 M/uL   Hemoglobin 11.9 (A) 14.1 - 18.1 g/dL   HCT, POC 35.6 (A) 43.5 - 53.7 %   MCV 85.0 80 - 97 fL   MCH, POC 28.5 27 - 31.2 pg   MCHC 33.5 31.8 - 35.4 g/dL   RDW, POC 15.2 %   Platelet Count, POC 315 142 - 424 K/uL   MPV 6.1 0 - 99.8 fL   Will treat for an exacerbation of her COPD. Return if worse..   There are no Patient Instructions on file for this visit.   Sherol Sabas, MD 10/09/2014

## 2014-10-12 ENCOUNTER — Other Ambulatory Visit: Payer: Self-pay | Admitting: Internal Medicine

## 2014-10-14 DIAGNOSIS — L299 Pruritus, unspecified: Secondary | ICD-10-CM | POA: Diagnosis not present

## 2014-10-14 DIAGNOSIS — L570 Actinic keratosis: Secondary | ICD-10-CM | POA: Diagnosis not present

## 2014-10-19 ENCOUNTER — Encounter: Payer: Self-pay | Admitting: Internal Medicine

## 2014-10-19 ENCOUNTER — Ambulatory Visit (INDEPENDENT_AMBULATORY_CARE_PROVIDER_SITE_OTHER): Payer: Medicare Other | Admitting: Internal Medicine

## 2014-10-19 ENCOUNTER — Telehealth: Payer: Self-pay

## 2014-10-19 VITALS — BP 114/84 | HR 78 | Temp 98.4°F | Ht 62.0 in | Wt 152.2 lb

## 2014-10-19 DIAGNOSIS — I10 Essential (primary) hypertension: Secondary | ICD-10-CM

## 2014-10-19 DIAGNOSIS — M810 Age-related osteoporosis without current pathological fracture: Secondary | ICD-10-CM

## 2014-10-19 DIAGNOSIS — Z0000 Encounter for general adult medical examination without abnormal findings: Secondary | ICD-10-CM | POA: Diagnosis not present

## 2014-10-19 DIAGNOSIS — E785 Hyperlipidemia, unspecified: Secondary | ICD-10-CM

## 2014-10-19 MED ORDER — POTASSIUM CHLORIDE CRYS ER 20 MEQ PO TBCR: 20.0000 meq | EXTENDED_RELEASE_TABLET | Freq: Two times a day (BID) | ORAL | Status: DC

## 2014-10-19 MED ORDER — PRAVASTATIN SODIUM 80 MG PO TABS: 80.0000 mg | ORAL_TABLET | Freq: Every evening | ORAL | Status: DC

## 2014-10-19 MED ORDER — APIXABAN 5 MG PO TABS: 5.0000 mg | ORAL_TABLET | Freq: Two times a day (BID) | ORAL | Status: DC

## 2014-10-19 MED ORDER — HYDRALAZINE HCL 50 MG PO TABS: 50.0000 mg | ORAL_TABLET | Freq: Three times a day (TID) | ORAL | Status: DC

## 2014-10-19 NOTE — Assessment & Plan Note (Signed)
Labile and uncontrolled for years Exacerbated by anxiety Also exacerbation with hosp for rapid A. Fib 02/2013: Amlodipine discontinued in place of diltiazem 04/2012 hosp for RVR Reviewed episode of bradycardia during 02/2013 hospitalization and cards visit 03/2013 - off Dig and decreaded Dilt because of same Bidil started hospitalization 06/2012 -titrated TID 02/2013 Because of lack of availability of Bidil, changed 04/2014 to 2 separate components: hydralazine and Imdur  also on Clonidine 0.2mg  twice daily, max ARB, BB (changed Zebeta to [facility] 07/3274) Stopped lasix because of itching -but tolerating torsemide -continue at current dosing patient and wife will monitor at home for mgmt of same -  educated on goal: 140s/80s - to check BP only 2x/day! Renal doppler 09/2014 reviewed Last cardiology evaluation with M.D., then PA, reviewed  BP Readings from Last 3 Encounters:  10/19/14 114/84  10/09/14 182/68  09/18/14 190/80

## 2014-10-19 NOTE — Assessment & Plan Note (Signed)
On statin for overlapping CAD Check lipids annually and titrate as needed follow up with cards on same

## 2014-10-19 NOTE — Progress Notes (Signed)
Pre visit review using our clinic review tool, if applicable. No additional management support is needed unless otherwise documented below in the visit note. 

## 2014-10-19 NOTE — Progress Notes (Signed)
Subjective:    Patient ID: [name], male    DOB: 1930/06/03, 81 y.o.   MRN: [id number]  HPI  Patient here for follow-up. Reviewed chronic medical conditions, interval events and current concerns. Also Medicare annual wellness visit as per Charlean Sanfilippo, health coach prior to visit with me -reviewed documentation and findings with patient today  Past Medical History  Diagnosis Date  . CORONARY HEART DISEASE     a. s/p CABG;  b. cath 06/27/10: S-Dx occluded, S-PDA 80-90% (tx with PCI); S-OM ok, L-LAD ok;  EF 65-70%  c.  s/p Promus DES to S-PDA 06/2010;   d. Myoview 8/12: low risk  . FIBRILLATION, ATRIAL     post op; ?documented during hosp. 06/2010  . SLEEP APNEA 09/2001    NPSG AHI 22/HR  . ALLERGIC RHINITIS   . ASTHMA   . Depression   . GERD     with HH, hx esophageal stricture  . DYSLIPIDEMIA   . HYPERTENSION     Echo 3/12: EF 55-60%; mod LVH; mild AS/AI; LAE; PASP 38; mild pulmo HTN  . Personal history of alcoholism   . Diverticulosis   . Benign liver cyst   . Skin cancer     L forearm  . Cataract     surgery to both eyes  . Esophageal stricture   . Hiatal hernia   . Pneumonia 04/2013 hosp  . Allergy   . Anxiety   . Arthritis   . CHF (congestive heart failure)   . COPD (chronic obstructive pulmonary disease)   . Glaucoma   . Osteoporosis   . History of heart attack     Review of Systems  Constitutional: Negative for fatigue and unexpected weight change.  Respiratory: Negative for cough and shortness of breath.   Cardiovascular: Negative for chest pain and leg swelling.       Objective:    Physical Exam  Constitutional: He appears well-developed and well-nourished. No distress.  Cardiovascular: Normal rate and regular rhythm.   Murmur (2/6 syst, unchanged) heard. Pulmonary/Chest: Effort normal and breath sounds normal. No respiratory distress. He has no wheezes.  Musculoskeletal: He exhibits no edema (wearing BLE compression hose).    BP 114/84 mmHg   Pulse 78  Temp(Src) 98.4 F (36.9 C)  Ht 5\' 2"  (1.575 m)  Wt 152 lb 4 oz (69.06 kg)  BMI 27.84 kg/m2  SpO2 96% Wt Readings from Last 3 Encounters:  10/19/14 152 lb 4 oz (69.06 kg)  10/09/14 152 lb (68.947 kg)  09/18/14 144 lb 9.6 oz (65.59 kg)    Lab Results  Component Value Date   WBC 7.5 10/09/2014   HGB 11.9* 10/09/2014   HCT 35.6* 10/09/2014   PLT 179 09/18/2014   GLUCOSE 113* 10/09/2014   CHOL 81 10/24/2013   TRIG 80.0 10/24/2013   HDL 27.80* 10/24/2013   LDLCALC 37 10/24/2013   ALT 17 09/18/2014   AST 35 09/18/2014   NA 141 10/09/2014   K 4.5 10/09/2014   CL 105 10/09/2014   CREATININE 1.00 10/09/2014   BUN 19 10/09/2014   CO2 25 10/09/2014   TSH 1.472 07/20/2014   PSA 0.62 10/10/2010   INR 1.19 05/04/2013   HGBA1C 6.4 10/16/2012    No results found.     Assessment & Plan:   Problem List Items Addressed This Visit    Accelerated hypertension    Labile and uncontrolled for years Exacerbated by anxiety Also exacerbation with hosp for rapid  A. Fib 02/2013: Amlodipine discontinued in place of diltiazem 04/2012 hosp for RVR Reviewed episode of bradycardia during 02/2013 hospitalization and cards visit 03/2013 - off Dig and decreaded Dilt because of same Bidil started hospitalization 06/2012 -titrated TID 02/2013 Because of lack of availability of Bidil, changed 04/2014 to 2 separate components: hydralazine and Imdur  also on Clonidine 0.2mg  twice daily, max ARB, BB (changed Zebeta to [facility] 09/7542) Stopped lasix because of itching -but tolerating torsemide -continue at current dosing patient and wife will monitor at home for mgmt of same -  educated on goal: 140s/80s - to check BP only 2x/day! Renal doppler 09/2014 reviewed Last cardiology evaluation with M.D., then PA, reviewed  BP Readings from Last 3 Encounters:  10/19/14 114/84  10/09/14 182/68  09/18/14 190/80        Relevant Medications   apixaban (ELIQUIS) 5 MG TABS tablet   hydrALAZINE  (APRESOLINE) 50 MG tablet   pravastatin (PRAVACHOL) 80 MG tablet   Hyperlipidemia with target LDL less than 100    On statin for overlapping CAD Check lipids annually and titrate as needed follow up with cards on same      Relevant Medications   apixaban (ELIQUIS) 5 MG TABS tablet   hydrALAZINE (APRESOLINE) 50 MG tablet   pravastatin (PRAVACHOL) 80 MG tablet   Other Relevant Orders   Lipid panel   Osteoporosis    No hx fracture, freq pred use due to COPD exacerbations Discussed med options for tx 05/2012 -  given hx esophageal stricture, recommended to begin Prolia injections -  Enrolled with patient support program -  1st dose done 05/16/12, repeat q 41mo - Injection dates: 04/2014 #2; #3 due now (10/2014) so will ask CMA to arrange same Also schedule follow up DEXA to monitor effects of tx Continue WB exercise activity and take Ca+D BID Education on same provided at length today      Relevant Orders   DG Bone Density    Other Visit Diagnoses    Routine history and physical examination of adult    -  Primary        Gwendolyn Grant, MD

## 2014-10-19 NOTE — Assessment & Plan Note (Signed)
No hx fracture, freq pred use due to COPD exacerbations Discussed med options for tx 05/2012 -  given hx esophageal stricture, recommended to begin Prolia injections -  Enrolled with patient support program -  1st dose done 05/16/12, repeat q 96mo - Injection dates: 04/2014 #2; #3 due now (10/2014) so will ask CMA to arrange same Also schedule follow up DEXA to monitor effects of tx Continue WB exercise activity and take Ca+D BID Education on same provided at length today

## 2014-10-19 NOTE — Telephone Encounter (Signed)
Pt is due for prolia injection again. Last verification was 07/2013. Is he still covered or does he need to recertified.

## 2014-10-19 NOTE — Patient Instructions (Addendum)
It was good to see you today.  We have reviewed your prior records including labs and tests today  Test(s) ordered today. Return when you are fasting for cholesterol test. Your results will be released to MyChart (or called to you) after review, usually within 72hours after test completion. If any changes need to be made, you will be notified at that same time.  Medications reviewed and updated, no changes recommended at this time.  We will schedule bone density test to follow effects of treatment and will schedule your next Prolia injection for treatment of osteoporosis  Please schedule followup in 3-4 months, call sooner if problems.   ------------------- [name] , Thank you for taking time to come for your Medicare Wellness Visit. I appreciate your ongoing commitment to your health goals. Please review the following plan we discussed and let me know if I can assist you in the future.   These are the goals we discussed: Goals    None      This is a list of the screening recommended for you and due dates:  Health Maintenance  Topic Date Due  . Shingles Vaccine  02/26/1993  . Flu Shot  11/09/2014  . Colon Cancer Screening  10/29/2017  . Tetanus Vaccine  10/06/2021  . Pneumonia vaccines  Completed      Fall Prevention and Home Safety Falls cause injuries and can affect all age groups. It is possible to prevent falls.  HOW TO PREVENT FALLS  Wear shoes with rubber soles that do not have an opening for your toes.  Keep the inside and outside of your house well lit.  Use night lights throughout your home.  Remove clutter from floors.  Clean up floor spills.  Remove throw rugs or fasten them to the floor with carpet tape.  Do not place electrical cords across pathways.  Put grab bars by your tub, shower, and toilet. Do not use towel bars as grab bars.  Put handrails on both sides of the stairway. Fix loose handrails.  Do not climb on stools or stepladders, if  possible.  Do not wax your floors.  Repair uneven or unsafe sidewalks, walkways, or stairs.  Keep items you use a lot within reach.  Be aware of pets.  Keep emergency numbers next to the telephone.  Put smoke detectors in your home and near bedrooms. Ask your doctor what other things you can do to prevent falls. Document Released: 01/21/2009 Document Revised: 09/26/2011 Document Reviewed: 06/27/2011 [facility] Patient Information 2015 [place], [place]. This information is not intended to replace advice given to you by your health care provider. Make sure you discuss any questions you have with your health care provider.  Health Maintenance A healthy lifestyle and preventative care can promote health and wellness.  Maintain regular health, dental, and eye exams.  Eat a healthy diet. Foods like vegetables, fruits, whole grains, low-fat dairy products, and lean protein foods contain the nutrients you need and are low in calories. Decrease your intake of foods high in solid fats, added sugars, and salt. Get information about a proper diet from your health care provider, if necessary.  Regular physical exercise is one of the most important things you can do for your health. Most adults should get at least 150 minutes of moderate-intensity exercise (any activity that increases your heart rate and causes you to sweat) each week. In addition, most adults need muscle-strengthening exercises on 2 or more days a week.   Maintain a healthy  weight. The body mass index (BMI) is a screening tool to identify possible weight problems. It provides an estimate of body fat based on height and weight. Your health care provider can find your BMI and can help you achieve or maintain a healthy weight. For males 20 years and older:  A BMI below 18.5 is considered underweight.  A BMI of 18.5 to 24.9 is normal.  A BMI of 25 to 29.9 is considered overweight.  A BMI of 30 and above is considered  obese.  Maintain normal blood lipids and cholesterol by exercising and minimizing your intake of saturated fat. Eat a balanced diet with plenty of fruits and vegetables. Blood tests for lipids and cholesterol should begin at age 53 and be repeated every 5 years. If your lipid or cholesterol levels are high, you are over age 6, or you are at high risk for heart disease, you may need your cholesterol levels checked more frequently.Ongoing high lipid and cholesterol levels should be treated with medicines if diet and exercise are not working.  If you smoke, find out from your health care provider how to quit. If you do not use tobacco, do not start.  Lung cancer screening is recommended for adults aged 72-80 years who are at high risk for developing lung cancer because of a history of smoking. A yearly low-dose CT scan of the lungs is recommended for people who have at least a 30-pack-year history of smoking and are current smokers or have quit within the past 15 years. A pack year of smoking is smoking an average of 1 pack of cigarettes a day for 1 year (for example, a 30-pack-year history of smoking could mean smoking 1 pack a day for 30 years or 2 packs a day for 15 years). Yearly screening should continue until the smoker has stopped smoking for at least 15 years. Yearly screening should be stopped for people who develop a health problem that would prevent them from having lung cancer treatment.  If you choose to drink alcohol, do not have more than 2 drinks per day. One drink is considered to be 12 oz (360 mL) of beer, 5 oz (150 mL) of wine, or 1.5 oz (45 mL) of liquor.  Avoid the use of street drugs. Do not share needles with anyone. Ask for help if you need support or instructions about stopping the use of drugs.  High blood pressure causes heart disease and increases the risk of stroke. Blood pressure should be checked at least every 1-2 years. Ongoing high blood pressure should be treated with  medicines if weight loss and exercise are not effective.  If you are 34-19 years old, ask your health care provider if you should take aspirin to prevent heart disease.  Diabetes screening involves taking a blood sample to check your fasting blood sugar level. This should be done once every 3 years after age 25 if you are at a normal weight and without risk factors for diabetes. Testing should be considered at a younger age or be carried out more frequently if you are overweight and have at least 1 risk factor for diabetes.  Colorectal cancer can be detected and often prevented. Most routine colorectal cancer screening begins at the age of 88 and continues through age 17. However, your health care provider may recommend screening at an earlier age if you have risk factors for colon cancer. On a yearly basis, your health care provider may provide home test kits to check for  hidden blood in the stool. A small camera at the end of a tube may be used to directly examine the colon (sigmoidoscopy or colonoscopy) to detect the earliest forms of colorectal cancer. Talk to your health care provider about this at age 57 when routine screening begins. A direct exam of the colon should be repeated every 5-10 years through age 27, unless early forms of precancerous polyps or small growths are found.  People who are at an increased risk for hepatitis B should be screened for this virus. You are considered at high risk for hepatitis B if:  You were born in a country where hepatitis B occurs often. Talk with your health care provider about which countries are considered high risk.  Your parents were born in a high-risk country and you have not received a shot to protect against hepatitis B (hepatitis B vaccine).  You have HIV or AIDS.  You use needles to inject street drugs.  You live with, or have sex with, someone who has hepatitis B.  You are a man who has sex with other men (MSM).  You get hemodialysis  treatment.  You take certain medicines for conditions like cancer, organ transplantation, and autoimmune conditions.  Hepatitis C blood testing is recommended for all people born from [birth year] through 1963 and any individual with known risk factors for hepatitis C.  Healthy men should no longer receive prostate-specific antigen (PSA) blood tests as part of routine cancer screening. Talk to your health care provider about prostate cancer screening.  Testicular cancer screening is not recommended for adolescents or adult males who have no symptoms. Screening includes self-exam, a health care provider exam, and other screening tests. Consult with your health care provider about any symptoms you have or any concerns you have about testicular cancer.  Practice safe sex. Use condoms and avoid high-risk sexual practices to reduce the spread of sexually transmitted infections (STIs).  You should be screened for STIs, including gonorrhea and chlamydia if:  You are sexually active and are younger than 24 years.  You are older than 24 years, and your health care provider tells you that you are at risk for this type of infection.  Your sexual activity has changed since you were last screened, and you are at an increased risk for chlamydia or gonorrhea. Ask your health care provider if you are at risk.  If you are at risk of being infected with HIV, it is recommended that you take a prescription medicine daily to prevent HIV infection. This is called pre-exposure prophylaxis (PrEP). You are considered at risk if:  You are a man who has sex with other men (MSM).  You are a heterosexual man who is sexually active with multiple partners.  You take drugs by injection.  You are sexually active with a partner who has HIV.  Talk with your health care provider about whether you are at high risk of being infected with HIV. If you choose to begin PrEP, you should first be tested for HIV. You should then be tested  every 3 months for as long as you are taking PrEP.  Use sunscreen. Apply sunscreen liberally and repeatedly throughout the day. You should seek shade when your shadow is shorter than you. Protect yourself by wearing long sleeves, pants, a wide-brimmed hat, and sunglasses year round whenever you are outdoors.  Tell your health care provider of new moles or changes in moles, especially if there is a change in shape or color. Also,  tell your health care provider if a mole is larger than the size of a pencil eraser.  A one-time screening for abdominal aortic aneurysm (AAA) and surgical repair of large AAAs by ultrasound is recommended for men aged 20-75 years who are current or former smokers.  Stay current with your vaccines (immunizations). Document Released: 09/23/2007 Document Revised: 04/01/2013 Document Reviewed: 08/22/2010 [facility] Patient Information 2015 [place], [place]. This information is not intended to replace advice given to you by your health care provider. Make sure you discuss any questions you have with your health care provider.

## 2014-10-19 NOTE — Progress Notes (Signed)
Subjective:   [name]. is a 81 y.o. male who presents for Medicare Annual/Subsequent preventive examination.  Review of Systems:   Presents with concerns about BP; up and down HRA assessment completed during visit;  Patient is here for Annual Wellness Assessment .   Personalized education given regarding risk on the problem list that are currently being medically managed;  Lifestyle changes reviewed:  Personalized education given regarding risk on the problem list that are currently being medically managed;  Hx of HTN; shingles recently with diastolic heart failure; CAD; hyperlipidemia; EF 55 to 60%   Diet: eats vegetables; meat; chicken and fish; Monitors sodium; Does not eat out much; Frozen vegetables; rarely eats out of a can  Ex[facility] [employer]; works in the yard and does silver sneakers at the [employer]; 60 minutes;  Normally day includes being outside as much as permitted with asthma.   SAFETY [place] [address] [place]; has flower gardens One level;  Generalized Safety in the home reviewed/   Fall prevention; no;  Wife is d/a  Urinary or fecal incontinence reviewed  Educated on prevention falls; Exercise, toning and strengthening; Balance exercises  Home/ safety; removal of throw rugs; bathroom handrails; Unlevel surfaces outside; Smoke detectors;sun protection; seatbelts   Lifeline: http://www.lifelinesys.com/content/home; 814-859-1351 [phone] / states do not need one yet, but knows about lifeline Screenings overdue Ophthalmology exam; Scheduled in August [place]; being directed to a specialist/ Immunizations; up to date Shingles DUE and educated on part D Tetanus due 09/2021 Flu vaccine due August 2016 Colonoscopy; due Juy 2019 EKG June 2016 Hearing: has 2 hearing aids now  Dental: Dentures    Care Team updated in [employer]         Objective:    Vitals: There were no vitals taken for this visit.  Tobacco History  Smoking status    . Former Smoker -- 3.00 packs/day for 40 years  . Types: Cigarettes  . Quit date: 04/11/1979  Smokeless tobacco  . Never Used     Counseling given: Not Answered   Past Medical History  Diagnosis Date  . CORONARY HEART DISEASE     a. s/p CABG;  b.  cath 06/27/10: S-Dx occluded, S-PDA 80-90% (tx with PCI); S-OM ok, L-LAD ok;  EF 65-70%  c.  s/p Promus DES to S-PDA 06/2010;   d. Myoview 8/12: low risk  . FIBRILLATION, ATRIAL     post op; ?documented during hosp. 06/2010  . SLEEP APNEA 09/2001    NPSG AHI 22/HR  . ALLERGIC RHINITIS   . ASTHMA   . Depression   . GERD     with HH, hx esophageal stricture  . DYSLIPIDEMIA   . HYPERTENSION     Echo 3/12: EF 55-60%; mod LVH; mild AS/AI; LAE; PASP 38; mild pulmo HTN  . Personal history of alcoholism   . Diverticulosis   . Benign liver cyst   . Skin cancer     L forearm  . Cataract     surgery to both eyes  . Esophageal stricture   . Hiatal hernia   . Pneumonia 04/2013 hosp  . Shortness of breath   . Allergy   . Anxiety   . Arthritis   . CHF (congestive heart failure)   . COPD (chronic obstructive pulmonary disease)   . Glaucoma   . Osteoporosis   . History of heart attack    Past Surgical History  Procedure Laterality Date  . Coronary artery  bypass graft  02/2007  . Tonsillectomy    . Coronary angioplasty with stent placement  07/2010  . Cataract extraction, bilateral  2007  . Hernia repair  unsure ?60's  . Cholecystectomy  02/21/2011    Procedure: LAPAROSCOPIC CHOLECYSTECTOMY WITH INTRAOPERATIVE CHOLANGIOGRAM;  Surgeon: Pedro Earls, MD;  Location: [facility];  Service: General;  Laterality: N/A;   Family History  Problem Relation Age of Onset  . COPD Sister   . Asthma Sister   . Emphysema Sister   . Hypertension Sister   . Hyperlipidemia Sister   . Hypertension Mother   . Heart disease Mother   . Hyperlipidemia Mother   . Colon cancer Neg Hx   . Heart disease Brother   . Heart disease Father   . Hyperlipidemia  Father   . Hypertension Father    History  Sexual Activity  . Sexual Activity: Yes    Outpatient Encounter Prescriptions as of 10/19/2014  Medication Sig  . albuterol (PROVENTIL HFA;VENTOLIN HFA) 108 (90 BASE) MCG/ACT inhaler Inhale 2 puffs into the lungs every 6 (six) hours as needed for wheezing or shortness of breath.  Marland Kitchen amoxicillin (AMOXIL) 875 MG tablet Take 1 tablet (875 mg total) by mouth 2 (two) times daily.  . budesonide (PULMICORT) 0.25 MG/2ML nebulizer solution Take 0.25 mg by nebulization at bedtime. For asthma  . calcium-vitamin D (OSCAL WITH D) 500-200 MG-UNIT per tablet Take 1 tablet by mouth 2 (two) times daily.  . cholestyramine (QUESTRAN) 4 G packet take 1 packet by mouth twice a day if needed for LOOSE STOOLS  . cloNIDine (CATAPRES) 0.3 MG tablet Take 1 tablet (0.3 mg total) by mouth 2 (two) times daily.  . cloNIDine (CATAPRES) 0.3 MG tablet Take 1 tablet (0.3 mg total) by mouth 2 (two) times daily.  Marland Kitchen denosumab (PROLIA) 60 MG/ML SOLN injection Inject 60 mg into the skin every 6 (six) months. Administer in upper arm, thigh, or abdomen  . diltiazem (CARDIZEM CD) 120 MG 24 hr capsule Take 1 capsule (120 mg total) by mouth 2 (two) times daily.  . DULERA 100-5 MCG/ACT AERO inhale 2 puffs by mouth once daily (THEN RINSE MOUTH AFTER USE)  . ELIQUIS 5 MG TABS tablet take 1 tablet by mouth twice a day  . ferrous sulfate 325 (65 FE) MG tablet take 1 tablet by mouth three times a day with food  . hydrALAZINE (APRESOLINE) 50 MG tablet Take 1.5 tablets (75 mg total) by mouth 3 (three) times daily.  Marland Kitchen ipratropium-albuterol (DUONEB) 0.5-2.5 (3) MG/3ML SOLN Take 3 mLs by nebulization at bedtime.  . isosorbide mononitrate (IMDUR) 60 MG 24 hr tablet Take 1 tablet (60 mg total) by mouth daily.  Marland Kitchen latanoprost (XALATAN) 0.005 % ophthalmic solution Place 1 drop into both eyes at bedtime.   Marland Kitchen LORazepam (ATIVAN) 0.5 MG tablet Take 1 tablet (0.5 mg total) by mouth 3 (three) times daily.  Marland Kitchen  losartan (COZAAR) 100 MG tablet Take 1 tablet (100 mg total) by mouth every evening.  Marland Kitchen LYSINE PO Take 1 tablet by mouth daily at 12 noon.  . montelukast (SINGULAIR) 10 MG tablet Take 10 mg by mouth at bedtime.  . nebivolol (BYSTOLIC) 10 MG tablet Take 1 tablet (10 mg total) by mouth daily.  . nitroGLYCERIN (NITROSTAT) 0.4 MG SL tablet Place 1 tablet (0.4 mg total) under the tongue every 5 (five) minutes as needed for chest pain.  . pantoprazole (PROTONIX) 40 MG tablet take 1 tablet by mouth twice a day  .  potassium chloride SA (K-DUR,KLOR-CON) 20 MEQ tablet take 1 tablet by mouth twice a day  . pravastatin (PRAVACHOL) 80 MG tablet take 1 tablet by mouth every evening  . predniSONE (DELTASONE) 20 MG tablet Take 3 pills daily for 2 days, then 2 daily for 2 days, then 1 daily for 2 days, then one half daily for 4 days for lungs  . predniSONE (STERAPRED UNI-PAK 21 TAB) 10 MG (21) TBPK tablet Take 1 tablet (10 mg total) by mouth daily. Or taper as directed  . Probiotic Product (PHILLIPS COLON HEALTH PO) Take 1 capsule by mouth every evening.   Marland Kitchen RA VITAMIN B-12 TR 1000 MCG TBCR take 1 tablet by mouth once daily  . torsemide (DEMADEX) 20 MG tablet Take 1 tablet (20 mg total) by mouth daily.   No facility-administered encounter medications on file as of 10/19/2014.    Activities of Daily Living In your present state of health, do you have any difficulty performing the following activities: 09/18/2014 07/19/2014  Hearing? N Y  Vision? N N  Difficulty concentrating or making decisions? N N  Walking or climbing stairs? N N  Dressing or bathing? N Y  Doing errands, shopping? N N    Patient Care Team: Rowe Clack, MD as PCP - General Minus Breeding, MD as Consulting Physician (Cardiology) Deneise Lever, MD as Consulting Physician (Pulmonary Disease) Irene Shipper, MD as Consulting Physician (Gastroenterology) Jari Pigg, MD as Consulting Physician (Dermatology) [facility] Grant Fontana., MD  as Consulting Physician (Urology)   Assessment:    Objective:   The goal of the wellness visit is to assist the patient how to close the gaps in care and create a preventative care plan for the patient.  Personalized Education was given regarding:   Pt determined a personalized goal; see patient goals;  Assessment included: smoking (secondary) educated as appropriate; including LDCT if as ppropriate  Bone density scan as appropriate/on prolia Calcium and Vit D as appropriate/ Osteoporosis risk reviewed  Taking meds without issues; no barriers identified Off prednison  Labs were and fup visit noted with MD if labs are due to be re-drawn.  Stress: Recommendations for managing stress if assessed as a factor;   No Risk for hepatitis or high risk social behavior identified via hepatitis screen  Educated on shingles and follow up with insurance company for co-pays or charges applied to Part D benefit. Educated on Vaccines;  Safety issues reviewed;  Cognition assessed by AD8; Score 0 MMSE deferred as the patient stated they had no memory issues; No identified risk were noted; The patient was oriented x 3; appropriate in dress and manner and no objective failures at ADL's or IADL's.   Depression screen negative;   Functional status reviewed; no losses in function x 1 year    End of life planning was discussed; aging in home or other; plans to complete HCPOA were discussed if not completed Declined at t his time      Exercise Activities and Dietary recommendations  Goals are to [facility] and take care of his yard  Goals    None     Fall Risk Fall Risk  07/25/2014 06/25/2013  Falls in the past year? No No   Depression Screen PHQ 2/9 Scores 10/09/2014 09/17/2014 07/25/2014 06/25/2013  PHQ - 2 Score 0 0 0 0    Cognitive Testing No flowsheet data found.  Immunization History  Administered Date(s) Administered  . Influenza Split 12/10/2010, 12/18/2011,  12/23/2013  . Influenza Whole 11/27/1996, 01/08/2001, 12/30/2009  . Influenza, High Dose Seasonal PF 12/10/2012  . Influenza-Unspecified 12/18/2013  . Pneumococcal Conjugate-13 07/29/2013  . Pneumococcal Polysaccharide-23 11/27/1996, 02/20/2001  . Tdap 10/07/2011   Screening Tests Health Maintenance  Topic Date Due  . ZOSTAVAX  02/26/1993  . INFLUENZA VACCINE  11/09/2014  . COLONOSCOPY  10/29/2017  . TETANUS/TDAP  10/06/2021  . PNA vac Low Risk Adult  Completed      Plan:     Fup with Dr.Leschber regarding rencent low Blood pressures During the course of the visit the patient was educated and counseled about the following appropriate screening and preventive services:   Vaccines to include Pneumoccal, Influenza, Hepatitis B, Td, Zostavax, HCV  Electrocardiogram  Cardiovascular Disease  Colorectal cancer screening  Diabetes screening  Prostate Cancer Screening  Glaucoma screening  Nutrition counseling   Smoking cessation counseling  Patient Instructions (the written plan) was given to the patient.    HYQMV,HQION, RN  10/19/2014   I have personally reviewed this case with patient/wife and health coach today. I also personally examined this patient. I agree with history and findings as documented above. I reviewed, discussed and approve of the assessment and plan as listed above. Gwendolyn Grant, MD

## 2014-10-21 ENCOUNTER — Other Ambulatory Visit (INDEPENDENT_AMBULATORY_CARE_PROVIDER_SITE_OTHER): Payer: Medicare Other

## 2014-10-21 DIAGNOSIS — E785 Hyperlipidemia, unspecified: Secondary | ICD-10-CM

## 2014-10-21 LAB — LIPID PANEL
Cholesterol: 139 mg/dL (ref 0–200)
HDL: 45.4 mg/dL (ref >39.00)
LDL Cholesterol: 81 mg/dL (ref 0–99)
NONHDL: 93.6
Total CHOL/HDL Ratio: 3
Triglycerides: 62 mg/dL (ref 0.0–149.0)
VLDL: 12.4 mg/dL (ref 0.0–40.0)

## 2014-10-23 ENCOUNTER — Telehealth (HOSPITAL_COMMUNITY): Payer: Self-pay | Admitting: *Deleted

## 2014-10-26 NOTE — Telephone Encounter (Signed)
I have electronically submitted pt's info for Prolia insurance verification and will notify you once I have a response. Thank you. °

## 2014-10-28 ENCOUNTER — Other Ambulatory Visit: Payer: Self-pay | Admitting: Cardiology

## 2014-10-28 NOTE — Telephone Encounter (Signed)
REFILL 

## 2014-10-29 ENCOUNTER — Telehealth: Payer: Self-pay | Admitting: Internal Medicine

## 2014-10-29 DIAGNOSIS — J449 Chronic obstructive pulmonary disease, unspecified: Secondary | ICD-10-CM

## 2014-10-29 NOTE — Telephone Encounter (Signed)
Called spoke with spouse. Aware will place order to get pt a new neb machine through [facility]. Nothing further needed

## 2014-11-02 DIAGNOSIS — J449 Chronic obstructive pulmonary disease, unspecified: Secondary | ICD-10-CM | POA: Diagnosis not present

## 2014-11-02 NOTE — Telephone Encounter (Signed)
Pt is on schedule for tomorrow for prolia inj. Any info on approval?

## 2014-11-03 ENCOUNTER — Ambulatory Visit (INDEPENDENT_AMBULATORY_CARE_PROVIDER_SITE_OTHER)
Admission: RE | Admit: 2014-11-03 | Discharge: 2014-11-03 | Disposition: A | Discharge status: Home / Self Care | Payer: Medicare Other | Source: Ambulatory Visit | Attending: Internal Medicine | Admitting: Internal Medicine

## 2014-11-03 ENCOUNTER — Telehealth: Payer: Self-pay

## 2014-11-03 ENCOUNTER — Ambulatory Visit: Payer: Medicare Other

## 2014-11-03 DIAGNOSIS — M810 Age-related osteoporosis without current pathological fracture: Secondary | ICD-10-CM | POA: Diagnosis not present

## 2014-11-03 NOTE — Telephone Encounter (Signed)
Phone note is on 7/11 original from [facility]

## 2014-11-03 NOTE — Telephone Encounter (Signed)
I have rec'd pt's insurance verification for Prolia.  Prolia is subject to a 20% co-insurance (approximately $180) plus a $10 co-pay whether an OV is billed or not.  This means pt's estimated responsibility will be $190. Please advise pt this is an estimate and we won't know an exact amt until the insurance has paid. I have sent a copy of the summary of benefits to be scanned into pt's chart.  If pt cannot afford $190 for his injection, please advise him to contact [employer] at [phone] and select option #1 to see if he qualifies for one of their assistance programs.  If he qualifies they will instruct him how to proceed.  If you have any questions, please let me know. Thank you.

## 2014-11-03 NOTE — Telephone Encounter (Signed)
Called pt to notify about $190.00 copay for prolia inj. If pt cannot make that payment there is a number that can be called. See prior phone note

## 2014-11-10 ENCOUNTER — Telehealth: Payer: Self-pay | Admitting: Internal Medicine

## 2014-11-10 ENCOUNTER — Ambulatory Visit (INDEPENDENT_AMBULATORY_CARE_PROVIDER_SITE_OTHER): Payer: Medicare Other

## 2014-11-10 DIAGNOSIS — J309 Allergic rhinitis, unspecified: Secondary | ICD-10-CM

## 2014-11-10 NOTE — Telephone Encounter (Signed)
Date Mixed: 11/10/14 Vial: 1 Strength: 1:10 Here/Mail/Pick Up: mail Mixed By: Laurette Schimke

## 2014-11-11 ENCOUNTER — Other Ambulatory Visit: Payer: Self-pay | Admitting: Internal Medicine

## 2014-11-24 ENCOUNTER — Inpatient Hospital Stay (HOSPITAL_COMMUNITY)
Admission: EM | Admit: 2014-11-24 | Discharge: 2014-11-27 | DRG: 246 | Disposition: A | Discharge status: Home / Self Care | Payer: Medicare Other | Attending: Internal Medicine | Admitting: Internal Medicine

## 2014-11-24 ENCOUNTER — Ambulatory Visit (INDEPENDENT_AMBULATORY_CARE_PROVIDER_SITE_OTHER): Payer: Medicare Other | Admitting: Internal Medicine

## 2014-11-24 ENCOUNTER — Encounter (HOSPITAL_COMMUNITY): Payer: Self-pay | Admitting: *Deleted

## 2014-11-24 ENCOUNTER — Emergency Department (HOSPITAL_COMMUNITY): Payer: Medicare Other

## 2014-11-24 VITALS — BP 192/92 | HR 125 | Temp 98.5°F | Resp 17 | Ht 63.5 in | Wt 147.0 lb

## 2014-11-24 DIAGNOSIS — Z951 Presence of aortocoronary bypass graft: Secondary | ICD-10-CM

## 2014-11-24 DIAGNOSIS — I10 Essential (primary) hypertension: Secondary | ICD-10-CM | POA: Diagnosis not present

## 2014-11-24 DIAGNOSIS — R000 Tachycardia, unspecified: Secondary | ICD-10-CM | POA: Insufficient documentation

## 2014-11-24 DIAGNOSIS — E785 Hyperlipidemia, unspecified: Secondary | ICD-10-CM | POA: Diagnosis present

## 2014-11-24 DIAGNOSIS — Z7901 Long term (current) use of anticoagulants: Secondary | ICD-10-CM

## 2014-11-24 DIAGNOSIS — R51 Headache: Secondary | ICD-10-CM | POA: Diagnosis present

## 2014-11-24 DIAGNOSIS — I209 Angina pectoris, unspecified: Secondary | ICD-10-CM | POA: Diagnosis not present

## 2014-11-24 DIAGNOSIS — Z955 Presence of coronary angioplasty implant and graft: Secondary | ICD-10-CM | POA: Diagnosis not present

## 2014-11-24 DIAGNOSIS — I160 Hypertensive urgency: Secondary | ICD-10-CM

## 2014-11-24 DIAGNOSIS — I480 Paroxysmal atrial fibrillation: Secondary | ICD-10-CM | POA: Diagnosis not present

## 2014-11-24 DIAGNOSIS — F1021 Alcohol dependence, in remission: Secondary | ICD-10-CM | POA: Diagnosis present

## 2014-11-24 DIAGNOSIS — M810 Age-related osteoporosis without current pathological fracture: Secondary | ICD-10-CM | POA: Diagnosis not present

## 2014-11-24 DIAGNOSIS — J45909 Unspecified asthma, uncomplicated: Secondary | ICD-10-CM | POA: Diagnosis present

## 2014-11-24 DIAGNOSIS — R079 Chest pain, unspecified: Secondary | ICD-10-CM | POA: Diagnosis not present

## 2014-11-24 DIAGNOSIS — H409 Unspecified glaucoma: Secondary | ICD-10-CM | POA: Diagnosis not present

## 2014-11-24 DIAGNOSIS — J984 Other disorders of lung: Secondary | ICD-10-CM | POA: Diagnosis not present

## 2014-11-24 DIAGNOSIS — F419 Anxiety disorder, unspecified: Secondary | ICD-10-CM | POA: Diagnosis present

## 2014-11-24 DIAGNOSIS — I5032 Chronic diastolic (congestive) heart failure: Secondary | ICD-10-CM

## 2014-11-24 DIAGNOSIS — R7989 Other specified abnormal findings of blood chemistry: Secondary | ICD-10-CM | POA: Diagnosis not present

## 2014-11-24 DIAGNOSIS — I2510 Atherosclerotic heart disease of native coronary artery without angina pectoris: Secondary | ICD-10-CM | POA: Diagnosis present

## 2014-11-24 DIAGNOSIS — I2582 Chronic total occlusion of coronary artery: Secondary | ICD-10-CM | POA: Diagnosis not present

## 2014-11-24 DIAGNOSIS — I214 Non-ST elevation (NSTEMI) myocardial infarction: Secondary | ICD-10-CM | POA: Diagnosis not present

## 2014-11-24 DIAGNOSIS — I25810 Atherosclerosis of coronary artery bypass graft(s) without angina pectoris: Secondary | ICD-10-CM | POA: Diagnosis not present

## 2014-11-24 DIAGNOSIS — Z87891 Personal history of nicotine dependence: Secondary | ICD-10-CM

## 2014-11-24 DIAGNOSIS — Z825 Family history of asthma and other chronic lower respiratory diseases: Secondary | ICD-10-CM | POA: Diagnosis not present

## 2014-11-24 DIAGNOSIS — K219 Gastro-esophageal reflux disease without esophagitis: Secondary | ICD-10-CM | POA: Diagnosis not present

## 2014-11-24 DIAGNOSIS — J449 Chronic obstructive pulmonary disease, unspecified: Secondary | ICD-10-CM | POA: Diagnosis present

## 2014-11-24 DIAGNOSIS — Z8249 Family history of ischemic heart disease and other diseases of the circulatory system: Secondary | ICD-10-CM | POA: Diagnosis not present

## 2014-11-24 DIAGNOSIS — Z79899 Other long term (current) drug therapy: Secondary | ICD-10-CM | POA: Diagnosis not present

## 2014-11-24 DIAGNOSIS — F329 Major depressive disorder, single episode, unspecified: Secondary | ICD-10-CM | POA: Diagnosis present

## 2014-11-24 DIAGNOSIS — I5033 Acute on chronic diastolic (congestive) heart failure: Secondary | ICD-10-CM | POA: Diagnosis not present

## 2014-11-24 DIAGNOSIS — R0789 Other chest pain: Secondary | ICD-10-CM | POA: Diagnosis not present

## 2014-11-24 LAB — BASIC METABOLIC PANEL
ANION GAP: 11 (ref 5–15)
BUN: 13 mg/dL (ref 6–20)
CHLORIDE: 103 mmol/L (ref 101–111)
CO2: 25 mmol/L (ref 22–32)
Calcium: 9.5 mg/dL (ref 8.9–10.3)
Creatinine, Ser: 1.05 mg/dL (ref 0.61–1.24)
GFR calc non Af Amer: >60 mL/min (ref >60)
Glucose, Bld: 128 mg/dL — ABNORMAL HIGH (ref 65–99)
Potassium: 3.7 mmol/L (ref 3.5–5.1)
SODIUM: 139 mmol/L (ref 135–145)

## 2014-11-24 LAB — CBC
HCT: 47.2 % (ref 39.0–52.0)
Hemoglobin: 15.8 g/dL (ref 13.0–17.0)
MCH: 29.8 pg (ref 26.0–34.0)
MCHC: 33.5 g/dL (ref 30.0–36.0)
MCV: 88.9 fL (ref 78.0–100.0)
Platelets: 166 10*3/uL (ref 150–400)
RBC: 5.31 MIL/uL (ref 4.22–5.81)
RDW: 15.1 % (ref 11.5–15.5)
WBC: 10.1 10*3/uL (ref 4.0–10.5)

## 2014-11-24 LAB — I-STAT TROPONIN, ED: Troponin i, poc: 0.15 ng/mL (ref 0.00–0.08)

## 2014-11-24 LAB — D-DIMER, QUANTITATIVE: D-Dimer, Quant: 0.54 ug/mL-FEU — ABNORMAL HIGH (ref 0.00–0.48)

## 2014-11-24 LAB — BRAIN NATRIURETIC PEPTIDE: B Natriuretic Peptide: 648.9 pg/mL — ABNORMAL HIGH (ref 0.0–100.0)

## 2014-11-24 MED ORDER — ASPIRIN 81 MG PO CHEW
324.0000 mg | CHEWABLE_TABLET | Freq: Once | ORAL | Status: AC
  Administered 2014-11-24: 324 mg via ORAL
  Filled 2014-11-24: qty 4

## 2014-11-24 MED ORDER — SODIUM CHLORIDE 0.9 % IV BOLUS (SEPSIS)
500.0000 mL | Freq: Once | INTRAVENOUS | Status: AC
  Administered 2014-11-25: 500 mL via INTRAVENOUS

## 2014-11-24 MED ORDER — HYDRALAZINE HCL 20 MG/ML IJ SOLN
5.0000 mg | Freq: Once | INTRAMUSCULAR | Status: AC
  Administered 2014-11-24: 5 mg via INTRAVENOUS
  Filled 2014-11-24: qty 1

## 2014-11-24 NOTE — ED Notes (Signed)
Pt to xray

## 2014-11-24 NOTE — ED Notes (Signed)
Patient states he was sent here from Urgent [facility] on [address] for HTN and rapid heart rate

## 2014-11-24 NOTE — ED Notes (Signed)
Elevated tri noted

## 2014-11-24 NOTE — Patient Instructions (Addendum)
-   Please go to the ED immediately. We will not call EMS since you refused but please report to the ED as you may be undergoing acute heart failure.

## 2014-11-24 NOTE — ED Provider Notes (Signed)
CSN: [id number]     Arrival date & time 11/24/14  1944 History   First MD Initiated Contact with Patient 11/24/14 2203     Chief Complaint  Patient presents with  . Hypertension  . Palpitations     (Consider location/radiation/quality/duration/timing/severity/associated sxs/prior Treatment) HPI  Pt presenting with c/o palpitations, heart racing, elevated blood pressure and chest tightness.  He states last night while he was doing some yard work he developed central chest tightness- this was relieved by nitroglycerin.  Today he has been feeling his heart race, did not take his morning dose of blood pressure medications, other than that states he has been taking regularly- states he was told to only take clonidine twice daily instead of 3 times daily by one of his doctor's.  Was seen at urgent care tonigth and advised to come to the ED.  No current chest pain.  deneis shortness of breath or leg swelling. Pt has hx of CAD, CABG, HTN, paroxysmal afib on eliquis.  There are no other associated systemic symptoms, there are no other alleviating or modifying factors.  His wife notes that she feels he is probably having chest pain more often than he tells her about or lets his doctors know.   Past Medical History  Diagnosis Date  . CORONARY HEART DISEASE     a. s/p CABG;  b. cath 06/27/10: S-Dx occluded, S-PDA 80-90% (tx with PCI); S-OM ok, L-LAD ok;  EF 65-70%  c.  s/p Promus DES to S-PDA 06/2010;   d. Myoview 8/12: low risk  . FIBRILLATION, ATRIAL     post op; ?documented during hosp. 06/2010  . SLEEP APNEA 09/2001    NPSG AHI 22/HR  . ALLERGIC RHINITIS   . ASTHMA   . Depression   . GERD     with HH, hx esophageal stricture  . DYSLIPIDEMIA   . HYPERTENSION     Echo 3/12: EF 55-60%; mod LVH; mild AS/AI; LAE; PASP 38; mild pulmo HTN  . Personal history of alcoholism   . Diverticulosis   . Benign liver cyst   . Skin cancer     L forearm  . Cataract     surgery to both eyes  . Esophageal  stricture   . Hiatal hernia   . Pneumonia 04/2013 hosp  . Allergy   . Anxiety   . Arthritis   . CHF (congestive heart failure)   . COPD (chronic obstructive pulmonary disease)   . Glaucoma   . Osteoporosis   . History of heart attack    Past Surgical History  Procedure Laterality Date  . Coronary artery bypass graft  02/2007  . Tonsillectomy    . Coronary angioplasty with stent placement  07/2010  . Cataract extraction, bilateral  2007  . Hernia repair  unsure ?60's  . Cholecystectomy  02/21/2011    Procedure: LAPAROSCOPIC CHOLECYSTECTOMY WITH INTRAOPERATIVE CHOLANGIOGRAM;  Surgeon: Pedro Earls, MD;  Location: [facility];  Service: General;  Laterality: N/A;  . Cardiac catheterization N/A 11/26/2014    Procedure: Left Heart Cath and Cors/Grafts Angiography;  Surgeon: Sherren Mocha, MD;  Location: [facility];  Service: Cardiovascular;  Laterality: N/A;  . Cardiac catheterization N/A 11/26/2014    Procedure: Coronary Stent Intervention;  Surgeon: Sherren Mocha, MD;  Location: [facility];  Service: Cardiovascular;  Laterality: N/A;   Family History  Problem Relation Age of Onset  . COPD Sister   . Asthma Sister   . Emphysema Sister   .  Hypertension Sister   . Hyperlipidemia Sister   . Hypertension Mother   . Heart disease Mother   . Hyperlipidemia Mother   . Colon cancer Neg Hx   . Heart disease Brother   . Heart disease Father   . Hyperlipidemia Father   . Hypertension Father    Social History  Substance Use Topics  . Smoking status: Former Smoker -- 3.00 packs/day for 40 years    Types: Cigarettes    Quit date: 04/11/1979  . Smokeless tobacco: Never Used  . Alcohol Use: No     Comment: quit drinking 40+ years    Review of Systems  ROS reviewed and all otherwise negative except for mentioned in HPI    Allergies  Ace inhibitors; Other; Codeine; and Lasix  Home Medications   Prior to Admission medications   Medication Sig Start Date End Date  Taking? Authorizing Provider  albuterol (PROVENTIL HFA;VENTOLIN HFA) 108 (90 BASE) MCG/ACT inhaler Inhale 2 puffs into the lungs every 6 (six) hours as needed for wheezing or shortness of breath. 06/24/14  Yes Deneise Lever, MD  apixaban (ELIQUIS) 5 MG TABS tablet Take 1 tablet (5 mg total) by mouth 2 (two) times daily. 10/19/14  Yes Rowe Clack, MD  budesonide (PULMICORT) 0.25 MG/2ML nebulizer solution Take 0.25 mg by nebulization at bedtime. For asthma   Yes Historical Provider, MD  calcium-vitamin D (OSCAL WITH D) 500-200 MG-UNIT per tablet Take 1 tablet by mouth 2 (two) times daily.   Yes Historical Provider, MD  cholestyramine Lucrezia Starch) 4 G packet take 1 packet by mouth twice a day if needed for LOOSE STOOLS Patient taking differently: Take 1 packet by mouth daily 09/09/14  Yes Irene Shipper, MD  cloNIDine (CATAPRES) 0.3 MG tablet Take 1 tablet (0.3 mg total) by mouth 2 (two) times daily. 09/18/14  Yes Charlynne Cousins, MD  denosumab (PROLIA) 60 MG/ML SOLN injection Inject 60 mg into the skin every 6 (six) months. Administer in upper arm, thigh, or abdomen 06/12/12  Yes Rowe Clack, MD  diltiazem (CARDIZEM CD) 120 MG 24 hr capsule Take 1 capsule (120 mg total) by mouth 2 (two) times daily. 10/13/14  Yes Rowe Clack, MD  DULERA 100-5 MCG/ACT AERO inhale 2 puffs by mouth once daily (THEN RINSE MOUTH AFTER USE) 07/14/14  Yes Rowe Clack, MD  hydrALAZINE (APRESOLINE) 50 MG tablet Take 1 tablet (50 mg total) by mouth 3 (three) times daily. Or as directed 10/19/14  Yes Rowe Clack, MD  ipratropium-albuterol (DUONEB) 0.5-2.5 (3) MG/3ML SOLN Take 3 mLs by nebulization at bedtime. 09/09/14  Yes Rowe Clack, MD  isosorbide mononitrate (IMDUR) 60 MG 24 hr tablet Take 1 tablet (60 mg total) by mouth daily. 04/22/14  Yes Rowe Clack, MD  latanoprost (XALATAN) 0.005 % ophthalmic solution Place 1 drop into both eyes at bedtime.    Yes Historical Provider, MD  LORazepam  (ATIVAN) 0.5 MG tablet Take 1 tablet (0.5 mg total) by mouth 3 (three) times daily. 09/10/14  Yes Rowe Clack, MD  losartan (COZAAR) 100 MG tablet Take 1 tablet (100 mg total) by mouth every evening. 10/08/14  Yes Minus Breeding, MD  LYSINE PO Take 1 tablet by mouth daily at 12 noon.   Yes Historical Provider, MD  montelukast (SINGULAIR) 10 MG tablet Take 1 tablet (10 mg total) by mouth at bedtime. 11/11/14  Yes Rowe Clack, MD  nebivolol (BYSTOLIC) 10 MG tablet Take 1 tablet (10  mg total) by mouth daily. 06/30/14  Yes Minus Breeding, MD  nitroGLYCERIN (NITROSTAT) 0.4 MG SL tablet Place 1 tablet (0.4 mg total) under the tongue every 5 (five) minutes as needed for chest pain. 07/13/14  Yes Minus Breeding, MD  pantoprazole (PROTONIX) 40 MG tablet take 1 tablet by mouth twice a day 10/28/14  Yes Minus Breeding, MD  potassium chloride SA (K-DUR,KLOR-CON) 20 MEQ tablet Take 1 tablet (20 mEq total) by mouth 2 (two) times daily. 10/19/14  Yes Rowe Clack, MD  pravastatin (PRAVACHOL) 80 MG tablet Take 1 tablet (80 mg total) by mouth every evening. 10/19/14  Yes Rowe Clack, MD  Probiotic Product (Naschitti PO) Take 1 capsule by mouth every evening.    Yes Historical Provider, MD  RA VITAMIN B-12 TR 1000 MCG TBCR take 1 tablet by mouth once daily 01/23/14  Yes Rowe Clack, MD  torsemide (DEMADEX) 20 MG tablet Take 1 tablet (20 mg total) by mouth daily. 09/18/14  Yes Charlynne Cousins, MD   BP 182/60 mmHg  Pulse 83  Temp(Src) 97.6 F (36.4 C) (Oral)  Resp 18  Ht 5\' 2"  (1.575 m)  Wt 151 lb 14.4 oz (68.901 kg)  BMI 27.78 kg/m2  SpO2 98%  Vitals reviewed Physical Exam  Physical Examination: General appearance - alert, well appearing, and in no distress Mental status - alert, oriented to person, place, and time Eyes - pupils equal and reactive, extraocular eye movements intact Mouth - mucous membranes moist, pharynx normal without lesions Neck - supple, no  significant adenopathy Chest - clear to auscultation, no wheezes, rales or rhonchi, symmetric air entry Heart - normal rate, regular rhythm, normal S1, S2, no murmurs, rubs, clicks or gallops Abdomen - soft, nontender, nondistended, no masses or organomegaly Neurological - alert, oriented, normal speech,  Extremities - peripheral pulses normal, no pedal edema, no clubbing or cyanosis Skin - normal coloration and turgor, no rashes  ED Course  Procedures (including critical care time) Labs Review Labs Reviewed  BASIC METABOLIC PANEL - Abnormal; Notable for the following:    Glucose, Bld 128 (*)    All other components within normal limits  BRAIN NATRIURETIC PEPTIDE - Abnormal; Notable for the following:    B Natriuretic Peptide 648.9 (*)    All other components within normal limits  D-DIMER, QUANTITATIVE (NOT AT [facility]) - Abnormal; Notable for the following:    D-Dimer, Quant 0.54 (*)    All other components within normal limits  TROPONIN I - Abnormal; Notable for the following:    Troponin I 0.15 (*)    All other components within normal limits  TROPONIN I - Abnormal; Notable for the following:    Troponin I 0.26 (*)    All other components within normal limits  TROPONIN I - Abnormal; Notable for the following:    Troponin I 0.19 (*)    All other components within normal limits  TROPONIN I - Abnormal; Notable for the following:    Troponin I 0.11 (*)    All other components within normal limits  I-STAT TROPOININ, ED - Abnormal; Notable for the following:    Troponin i, poc 0.15 (*)    All other components within normal limits  CBC  BASIC METABOLIC PANEL  CBC  POCT ACTIVATED CLOTTING TIME    Imaging Review No results found. I have personally reviewed and evaluated these images and lab results as part of my medical decision-making.   EKG Interpretation   Date/Time:  Wednesday November 25 2014 00:13:27 EDT Ventricular Rate:  139 PR Interval:  104 QRS Duration: 95 QT  Interval:  367 QTC Calculation: 558 R Axis:   98 Text Interpretation:  Sinus or ectopic atrial tachycardia Anterior  infarct, old Prolonged QT interval Artifact in lead(s) I II aVR aVL aVF V1  V3 V5 V6 Since previous tracing rate faster Confirmed by [facility]  MD,  Siennah Barrasso [id number]) on 11/25/2014 12:22:19 AM      MDM   Final diagnoses:  Essential hypertension  Tachycardia  Chest pain, unspecified chest pain type    Pt was treated in the ED with IV hydralazine for BP control- he did have an episode of chest pain in the ED, resolved after one nitroglycen.  Troponin mildly elevated at 0.11- d/w cardiology- no recommendations given during phone call- they will see patient.  BP and HR are improved from admission vitals.  CT angio obtained due to possible PE with tachycardia and chest pain, however relatively low suspicion as patient takes eliquis due to paroxysmal afib.    12:09 AM pt now c/o chest tightness, HR increased to 130s, will give nitro and repeat EKG now.  Repeat troponin is pending as well as CT angio chest.   12:47 AM pt is now chest pain free after nitroglycerin x 1  1:13 AM d/w cardiology fellow, Dr. Claiborne Billings, he will consult on the patient and advises medical admission.  Paging triad now.  1:20 AM d/w Dr. Nehemiah Settle, triad for admission to telemetry bed.   Alfonzo Beers, MD 11/27/14 [phone]

## 2014-11-24 NOTE — Progress Notes (Signed)
MRN: [id number] DOB: [birth year]  Subjective:   [name]. is a 81 y.o. male presenting for chief complaint of Hypertension and high pulse  Reports 2 day history of elevated blood pressure. Patient reports missing his BP dose in the morning yesterday. While doing yard work, patient started having mild headache, chest tightness and nausea relieved with rest and SL nitro. He proceeded to try yard work again but symptoms returned and stopped thereafter. He has since taken his BP medication as prescribed but reports that his BP has remained elevated in 190's-200's and still "feels off". Denies chest pain, shob, diaphoresis, neck pain, jaw pain, confusion, double or blurred vision, abdominal pain, weakness. He drinks 2 bottles of water daily, denies smoking. Denies any other aggravating or relieving factors, no other questions or concerns.  [place] has a current medication list which includes the following prescription(s): albuterol, apixaban, budesonide, calcium-vitamin d, cholestyramine, clonidine, denosumab, diltiazem, dulera, hydralazine, ipratropium-albuterol, isosorbide mononitrate, latanoprost, lorazepam, losartan, lysine, montelukast, nebivolol, nitroglycerin, pantoprazole, potassium chloride sa, pravastatin, probiotic product, ra vitamin b-12 tr, and torsemide. Also is allergic to ace inhibitors; other; codeine; and lasix.  [place]un  has a past medical history of CORONARY HEART DISEASE; FIBRILLATION, ATRIAL; SLEEP APNEA (09/2001); ALLERGIC RHINITIS; ASTHMA; Depression; GERD; DYSLIPIDEMIA; HYPERTENSION; Personal history of alcoholism; Diverticulosis; Benign liver cyst; Skin cancer; Cataract; Esophageal stricture; Hiatal hernia; Pneumonia (04/2013 hosp); Allergy; Anxiety; Arthritis; CHF (congestive heart failure); COPD (chronic obstructive pulmonary disease); Glaucoma; Osteoporosis; and History of heart attack. Also  has past surgical history that includes Coronary artery bypass graft (02/2007);  Tonsillectomy; Coronary angioplasty with stent (07/2010); Cataract extraction, bilateral (2007); Hernia repair (unsure ?60's); and Cholecystectomy (02/21/2011).  Objective:   Vitals: BP 192/92 mmHg  Pulse 125  Temp(Src) 98.5 F (36.9 C) (Oral)  Resp 17  Ht 5' 3.5" (1.613 m)  Wt 147 lb (66.679 kg)  BMI 25.63 kg/m2  SpO2 98%  BP Readings from Last 3 Encounters:  11/24/14 192/92  10/19/14 114/84  10/09/14 182/68   Physical Exam  Constitutional: He is oriented to person, place, and time. He appears well-developed and well-nourished.  HENT:  Mouth/Throat: Oropharynx is clear and moist.  Eyes: EOM are normal. Pupils are equal, round, and reactive to light. No scleral icterus.  Cardiovascular: Tachycardia present.  Exam reveals no gallop and no friction rub.   No murmur heard. Pulmonary/Chest: No respiratory distress. He has no wheezes. He has no rales.  Abdominal: Soft. Bowel sounds are normal. He exhibits no distension and no mass. There is no tenderness.  Musculoskeletal: He exhibits no edema (peripheral).  Neurological: He is alert and oriented to person, place, and time. No cranial nerve deficit. Coordination normal.  Skin: Skin is warm and dry. No rash noted. No erythema. No pallor.  Psychiatric: He has a normal mood and affect.   ECG interpretation by Dr. Laney Pastor and PA-Chang Tiggs - sinus tachycardia.  Assessment and Plan :   This case was precepted with Dr. Laney Pastor.  1. Hypertensive urgency 2. Chronic diastolic heart failure 3. Tachycardia 4. Atherosclerosis of native coronary artery of native heart without angina pectoris 5. Hx of CABG 2008 - Patient's pulse is consistently in the 80's-90's per review flowsheets. Given patient's tachycardia today, extensive cardiovascular history, we recommended patient be sent by EMS to ED for emergent evaluation. Differential includes acute on chronic heart failure, dehydration, hypertensive urgency, uncontrolled hypertension secondary  to non-compliance. Patient refused transportation via EMS and contracted for safety with this caretaker who agreed to transport  patient to ED.  Jaynee Eagles, PA-C [facility] [phone] 11/24/2014 7:04 PM  I have participated in the care of this patient with the Advanced Practice Provider and agree with Diagnosis and Plan as documented. The concern here is tachycardia in the face of anginal symptoms yesterday and out of control BP over past 24hrs. He is fragile as octogenarian and has been working in the heat. My exam revealed premature beats not seen on EKG. Shaquil P. Laney Pastor, M.D.

## 2014-11-25 ENCOUNTER — Emergency Department (HOSPITAL_COMMUNITY): Payer: Medicare Other

## 2014-11-25 ENCOUNTER — Encounter (HOSPITAL_COMMUNITY): Payer: Self-pay | Admitting: Radiology

## 2014-11-25 DIAGNOSIS — I2582 Chronic total occlusion of coronary artery: Secondary | ICD-10-CM | POA: Diagnosis not present

## 2014-11-25 DIAGNOSIS — F329 Major depressive disorder, single episode, unspecified: Secondary | ICD-10-CM | POA: Diagnosis present

## 2014-11-25 DIAGNOSIS — I10 Essential (primary) hypertension: Secondary | ICD-10-CM | POA: Diagnosis not present

## 2014-11-25 DIAGNOSIS — R51 Headache: Secondary | ICD-10-CM | POA: Diagnosis present

## 2014-11-25 DIAGNOSIS — J439 Emphysema, unspecified: Secondary | ICD-10-CM | POA: Diagnosis not present

## 2014-11-25 DIAGNOSIS — K219 Gastro-esophageal reflux disease without esophagitis: Secondary | ICD-10-CM | POA: Diagnosis present

## 2014-11-25 DIAGNOSIS — Z825 Family history of asthma and other chronic lower respiratory diseases: Secondary | ICD-10-CM | POA: Diagnosis not present

## 2014-11-25 DIAGNOSIS — R079 Chest pain, unspecified: Secondary | ICD-10-CM | POA: Diagnosis present

## 2014-11-25 DIAGNOSIS — R000 Tachycardia, unspecified: Secondary | ICD-10-CM | POA: Diagnosis not present

## 2014-11-25 DIAGNOSIS — H409 Unspecified glaucoma: Secondary | ICD-10-CM | POA: Diagnosis present

## 2014-11-25 DIAGNOSIS — Z79899 Other long term (current) drug therapy: Secondary | ICD-10-CM | POA: Diagnosis not present

## 2014-11-25 DIAGNOSIS — I480 Paroxysmal atrial fibrillation: Secondary | ICD-10-CM | POA: Diagnosis not present

## 2014-11-25 DIAGNOSIS — E785 Hyperlipidemia, unspecified: Secondary | ICD-10-CM | POA: Diagnosis present

## 2014-11-25 DIAGNOSIS — I2510 Atherosclerotic heart disease of native coronary artery without angina pectoris: Secondary | ICD-10-CM | POA: Diagnosis not present

## 2014-11-25 DIAGNOSIS — F419 Anxiety disorder, unspecified: Secondary | ICD-10-CM | POA: Diagnosis present

## 2014-11-25 DIAGNOSIS — I209 Angina pectoris, unspecified: Secondary | ICD-10-CM | POA: Diagnosis not present

## 2014-11-25 DIAGNOSIS — J449 Chronic obstructive pulmonary disease, unspecified: Secondary | ICD-10-CM | POA: Diagnosis present

## 2014-11-25 DIAGNOSIS — R0789 Other chest pain: Secondary | ICD-10-CM | POA: Diagnosis not present

## 2014-11-25 DIAGNOSIS — J45909 Unspecified asthma, uncomplicated: Secondary | ICD-10-CM | POA: Diagnosis present

## 2014-11-25 DIAGNOSIS — Z7901 Long term (current) use of anticoagulants: Secondary | ICD-10-CM | POA: Diagnosis not present

## 2014-11-25 DIAGNOSIS — Z8249 Family history of ischemic heart disease and other diseases of the circulatory system: Secondary | ICD-10-CM | POA: Diagnosis not present

## 2014-11-25 DIAGNOSIS — I5033 Acute on chronic diastolic (congestive) heart failure: Secondary | ICD-10-CM | POA: Diagnosis not present

## 2014-11-25 DIAGNOSIS — I25810 Atherosclerosis of coronary artery bypass graft(s) without angina pectoris: Secondary | ICD-10-CM | POA: Diagnosis not present

## 2014-11-25 DIAGNOSIS — Z955 Presence of coronary angioplasty implant and graft: Secondary | ICD-10-CM | POA: Diagnosis not present

## 2014-11-25 DIAGNOSIS — F1021 Alcohol dependence, in remission: Secondary | ICD-10-CM | POA: Diagnosis present

## 2014-11-25 DIAGNOSIS — I214 Non-ST elevation (NSTEMI) myocardial infarction: Secondary | ICD-10-CM | POA: Diagnosis not present

## 2014-11-25 DIAGNOSIS — Z951 Presence of aortocoronary bypass graft: Secondary | ICD-10-CM | POA: Diagnosis not present

## 2014-11-25 DIAGNOSIS — Z87891 Personal history of nicotine dependence: Secondary | ICD-10-CM | POA: Diagnosis not present

## 2014-11-25 DIAGNOSIS — M810 Age-related osteoporosis without current pathological fracture: Secondary | ICD-10-CM | POA: Diagnosis present

## 2014-11-25 LAB — TROPONIN I
TROPONIN I: 0.26 ng/mL — AB (ref <0.031)
TROPONIN I: 0.19 ng/mL — AB (ref <0.031)
TROPONIN I: 0.11 ng/mL — AB (ref <0.031)
Troponin I: 0.15 ng/mL — ABNORMAL HIGH (ref <0.031)

## 2014-11-25 MED ORDER — APIXABAN 5 MG PO TABS
5.0000 mg | ORAL_TABLET | Freq: Two times a day (BID) | ORAL | Status: DC
  Filled 2014-11-25 (×2): qty 1

## 2014-11-25 MED ORDER — ALBUTEROL SULFATE (2.5 MG/3ML) 0.083% IN NEBU: 3.0000 mL | INHALATION_SOLUTION | Freq: Four times a day (QID) | RESPIRATORY_TRACT | Status: DC | PRN

## 2014-11-25 MED ORDER — METOPROLOL TARTRATE 25 MG PO TABS
25.0000 mg | ORAL_TABLET | Freq: Three times a day (TID) | ORAL | Status: DC
  Administered 2014-11-25 – 2014-11-27 (×8): 25 mg via ORAL
  Filled 2014-11-25 (×10): qty 1

## 2014-11-25 MED ORDER — SODIUM CHLORIDE 0.9 % IJ SOLN: 3.0000 mL | INTRAMUSCULAR | Status: DC | PRN

## 2014-11-25 MED ORDER — MONTELUKAST SODIUM 10 MG PO TABS
10.0000 mg | ORAL_TABLET | Freq: Every day | ORAL | Status: DC
  Administered 2014-11-25 – 2014-11-26 (×2): 10 mg via ORAL
  Filled 2014-11-25 (×3): qty 1

## 2014-11-25 MED ORDER — PANTOPRAZOLE SODIUM 40 MG PO TBEC
40.0000 mg | DELAYED_RELEASE_TABLET | Freq: Two times a day (BID) | ORAL | Status: DC
  Administered 2014-11-25 – 2014-11-27 (×5): 40 mg via ORAL
  Filled 2014-11-25 (×3): qty 1

## 2014-11-25 MED ORDER — HYDRALAZINE HCL 20 MG/ML IJ SOLN
10.0000 mg | Freq: Once | INTRAMUSCULAR | Status: DC
  Filled 2014-11-25: qty 1

## 2014-11-25 MED ORDER — ISOSORBIDE MONONITRATE ER 60 MG PO TB24
60.0000 mg | ORAL_TABLET | Freq: Every day | ORAL | Status: DC
  Administered 2014-11-25 – 2014-11-27 (×3): 60 mg via ORAL
  Filled 2014-11-25 (×4): qty 1

## 2014-11-25 MED ORDER — LATANOPROST 0.005 % OP SOLN
1.0000 [drp] | Freq: Every day | OPHTHALMIC | Status: DC
  Administered 2014-11-25 – 2014-11-26 (×2): 1 [drp] via OPHTHALMIC
  Filled 2014-11-25: qty 2.5

## 2014-11-25 MED ORDER — ONDANSETRON HCL 4 MG/2ML IJ SOLN: 4.0000 mg | Freq: Four times a day (QID) | INTRAMUSCULAR | Status: DC | PRN

## 2014-11-25 MED ORDER — SODIUM CHLORIDE 0.9 % WEIGHT BASED INFUSION
3.0000 mL/kg/h | INTRAVENOUS | Status: DC
  Administered 2014-11-26: 3 mL/kg/h via INTRAVENOUS

## 2014-11-25 MED ORDER — CALCIUM CARBONATE-VITAMIN D 500-200 MG-UNIT PO TABS
1.0000 | ORAL_TABLET | Freq: Two times a day (BID) | ORAL | Status: DC
  Administered 2014-11-25 – 2014-11-27 (×5): 1 via ORAL
  Filled 2014-11-25 (×6): qty 1

## 2014-11-25 MED ORDER — DENOSUMAB 60 MG/ML ~~LOC~~ SOLN: 60.0000 mg | SUBCUTANEOUS | Status: DC

## 2014-11-25 MED ORDER — DILTIAZEM HCL ER COATED BEADS 120 MG PO CP24
120.0000 mg | ORAL_CAPSULE | Freq: Two times a day (BID) | ORAL | Status: DC
  Administered 2014-11-25 – 2014-11-27 (×5): 120 mg via ORAL
  Filled 2014-11-25 (×7): qty 1

## 2014-11-25 MED ORDER — METOPROLOL TARTRATE 1 MG/ML IV SOLN
5.0000 mg | Freq: Once | INTRAVENOUS | Status: AC
  Administered 2014-11-25: 5 mg via INTRAVENOUS
  Filled 2014-11-25: qty 5

## 2014-11-25 MED ORDER — TORSEMIDE 20 MG PO TABS
20.0000 mg | ORAL_TABLET | Freq: Every day | ORAL | Status: DC
  Administered 2014-11-25 – 2014-11-27 (×2): 20 mg via ORAL
  Filled 2014-11-25 (×5): qty 1

## 2014-11-25 MED ORDER — SODIUM CHLORIDE 0.9 % WEIGHT BASED INFUSION: 3.0000 mL/kg/h | INTRAVENOUS | Status: DC

## 2014-11-25 MED ORDER — BUDESONIDE 0.25 MG/2ML IN SUSP
0.2500 mg | Freq: Every day | RESPIRATORY_TRACT | Status: DC
  Administered 2014-11-25: 0.25 mg via RESPIRATORY_TRACT
  Filled 2014-11-25 (×4): qty 2

## 2014-11-25 MED ORDER — LOSARTAN POTASSIUM 50 MG PO TABS
100.0000 mg | ORAL_TABLET | Freq: Every evening | ORAL | Status: DC
  Administered 2014-11-25 – 2014-11-26 (×2): 100 mg via ORAL
  Filled 2014-11-25 (×4): qty 2

## 2014-11-25 MED ORDER — ACETAMINOPHEN 325 MG PO TABS: 650.0000 mg | ORAL_TABLET | ORAL | Status: DC | PRN

## 2014-11-25 MED ORDER — SODIUM CHLORIDE 0.9 % WEIGHT BASED INFUSION: 1.0000 mL/kg/h | INTRAVENOUS | Status: DC

## 2014-11-25 MED ORDER — VITAMIN B-12 1000 MCG PO TABS
1000.0000 ug | ORAL_TABLET | Freq: Every day | ORAL | Status: DC
  Administered 2014-11-25 – 2014-11-27 (×3): 1000 ug via ORAL
  Filled 2014-11-25 (×3): qty 1

## 2014-11-25 MED ORDER — CHOLESTYRAMINE 4 G PO PACK
4.0000 g | PACK | Freq: Every day | ORAL | Status: DC
  Administered 2014-11-25 – 2014-11-27 (×2): 4 g via ORAL
  Filled 2014-11-25 (×3): qty 1

## 2014-11-25 MED ORDER — GI COCKTAIL ~~LOC~~
30.0000 mL | Freq: Four times a day (QID) | ORAL | Status: DC | PRN
  Filled 2014-11-25: qty 30

## 2014-11-25 MED ORDER — ASPIRIN 81 MG PO CHEW: 81.0000 mg | CHEWABLE_TABLET | ORAL | Status: DC

## 2014-11-25 MED ORDER — MORPHINE SULFATE (PF) 2 MG/ML IV SOLN: 2.0000 mg | INTRAVENOUS | Status: DC | PRN

## 2014-11-25 MED ORDER — SODIUM CHLORIDE 0.9 % IV SOLN: 250.0000 mL | INTRAVENOUS | Status: DC | PRN

## 2014-11-25 MED ORDER — CYANOCOBALAMIN ER 1000 MCG PO TBCR: 1.0000 | EXTENDED_RELEASE_TABLET | Freq: Every day | ORAL | Status: DC

## 2014-11-25 MED ORDER — IPRATROPIUM-ALBUTEROL 0.5-2.5 (3) MG/3ML IN SOLN
3.0000 mL | Freq: Every day | RESPIRATORY_TRACT | Status: DC
  Administered 2014-11-25 – 2014-11-26 (×2): 3 mL via RESPIRATORY_TRACT
  Filled 2014-11-25 (×2): qty 3

## 2014-11-25 MED ORDER — NITROGLYCERIN 0.4 MG SL SUBL
0.4000 mg | SUBLINGUAL_TABLET | SUBLINGUAL | Status: DC | PRN
  Administered 2014-11-25 (×2): 0.4 mg via SUBLINGUAL

## 2014-11-25 MED ORDER — MOMETASONE FURO-FORMOTEROL FUM 100-5 MCG/ACT IN AERO
2.0000 | INHALATION_SPRAY | Freq: Two times a day (BID) | RESPIRATORY_TRACT | Status: DC
  Administered 2014-11-25 – 2014-11-27 (×5): 2 via RESPIRATORY_TRACT
  Filled 2014-11-25 (×2): qty 8.8

## 2014-11-25 MED ORDER — SODIUM CHLORIDE 0.9 % IJ SOLN
3.0000 mL | Freq: Two times a day (BID) | INTRAMUSCULAR | Status: DC
  Administered 2014-11-25: 3 mL via INTRAVENOUS

## 2014-11-25 MED ORDER — POTASSIUM CHLORIDE CRYS ER 20 MEQ PO TBCR
20.0000 meq | EXTENDED_RELEASE_TABLET | Freq: Two times a day (BID) | ORAL | Status: DC
Start: 2014-11-25 — End: 2014-11-27
  Administered 2014-11-25 – 2014-11-27 (×5): 20 meq via ORAL
  Filled 2014-11-25 (×5): qty 1

## 2014-11-25 MED ORDER — CLONIDINE HCL 0.3 MG PO TABS
0.3000 mg | ORAL_TABLET | Freq: Two times a day (BID) | ORAL | Status: DC
  Administered 2014-11-25 – 2014-11-27 (×5): 0.3 mg via ORAL
  Filled 2014-11-25 (×6): qty 1

## 2014-11-25 MED ORDER — ASPIRIN 81 MG PO CHEW
81.0000 mg | CHEWABLE_TABLET | ORAL | Status: AC
  Administered 2014-11-26: 81 mg via ORAL
  Filled 2014-11-25: qty 1

## 2014-11-25 MED ORDER — HYDRALAZINE HCL 25 MG PO TABS
50.0000 mg | ORAL_TABLET | Freq: Three times a day (TID) | ORAL | Status: DC
  Administered 2014-11-25 – 2014-11-27 (×8): 50 mg via ORAL
  Filled 2014-11-25: qty 2
  Filled 2014-11-25 (×3): qty 1
  Filled 2014-11-25: qty 2
  Filled 2014-11-25: qty 1
  Filled 2014-11-25: qty 2
  Filled 2014-11-25 (×2): qty 1

## 2014-11-25 MED ORDER — IOHEXOL 350 MG/ML SOLN
100.0000 mL | Freq: Once | INTRAVENOUS | Status: AC | PRN
  Administered 2014-11-25: 100 mL via INTRAVENOUS

## 2014-11-25 MED ORDER — PRAVASTATIN SODIUM 40 MG PO TABS
80.0000 mg | ORAL_TABLET | Freq: Every evening | ORAL | Status: DC
  Administered 2014-11-25 – 2014-11-27 (×3): 80 mg via ORAL
  Filled 2014-11-25: qty 2
  Filled 2014-11-25 (×2): qty 1
  Filled 2014-11-25: qty 2

## 2014-11-25 MED ORDER — NEBIVOLOL HCL 10 MG PO TABS
10.0000 mg | ORAL_TABLET | Freq: Every day | ORAL | Status: DC
  Administered 2014-11-25: 10 mg via ORAL
  Filled 2014-11-25: qty 1

## 2014-11-25 MED ORDER — LORAZEPAM 0.5 MG PO TABS
0.5000 mg | ORAL_TABLET | Freq: Three times a day (TID) | ORAL | Status: DC
  Administered 2014-11-25 – 2014-11-27 (×7): 0.5 mg via ORAL
  Filled 2014-11-25 (×7): qty 1

## 2014-11-25 NOTE — H&P (Signed)
History and Physical  [name] DJS:[id number] DOB: 10-19-1930 DOA: 11/24/2014  Referring physician: Dr. Canary Brim, ED physician PCP: Gwendolyn Grant, MD   Chief Complaint: Chest pain  HPI: Stephen Wilms. is a 81 y.o. male  With a history of coronary artery disease status post CABG in 2008 with stenting in 2012, PAF on Eliquis, hypertension, dyslipidemia, GERD, asthma. Patient developed left chest tightness, nausea, headache yesterday evening with exertion. These symptoms improved with rest and sublingual nitroglycerin. The symptoms returned around 8 PM. The patient took another dose of nitroglycerin, which improved his discomfort. His blood pressure and heart rate remained high, so the patient presented to the emergency department for evaluation. This is his seventh admission for elevated blood pressure and tachycardia. Of note, the patient did miss his morning medications yesterday morning. He is currently asymptomatic from chest pain, although he remains tachycardic.   Review of Systems:   Pt complains of tachycardia, nausea with tachycardia, headache, easy bruising and chest tightness.  Pt denies any fevers, chills, nausea, vomiting, shortness breath, palpitations, lightheadedness, dizziness, abdominal pain, diarrhea, constipation, melena, rectal bleeding.  Review of systems are otherwise negative  Past Medical History  Diagnosis Date  . CORONARY HEART DISEASE     a. s/p CABG;  b. cath 06/27/10: S-Dx occluded, S-PDA 80-90% (tx with PCI); S-OM ok, L-LAD ok;  EF 65-70%  c.  s/p Promus DES to S-PDA 06/2010;   d. Myoview 8/12: low risk  . FIBRILLATION, ATRIAL     post op; ?documented during hosp. 06/2010  . SLEEP APNEA 09/2001    NPSG AHI 22/HR  . ALLERGIC RHINITIS   . ASTHMA   . Depression   . GERD     with HH, hx esophageal stricture  . DYSLIPIDEMIA   . HYPERTENSION     Echo 3/12: EF 55-60%; mod LVH; mild AS/AI; LAE; PASP 38; mild pulmo HTN  . Personal history of  alcoholism   . Diverticulosis   . Benign liver cyst   . Skin cancer     L forearm  . Cataract     surgery to both eyes  . Esophageal stricture   . Hiatal hernia   . Pneumonia 04/2013 hosp  . Allergy   . Anxiety   . Arthritis   . CHF (congestive heart failure)   . COPD (chronic obstructive pulmonary disease)   . Glaucoma   . Osteoporosis   . History of heart attack    Past Surgical History  Procedure Laterality Date  . Coronary artery bypass graft  02/2007  . Tonsillectomy    . Coronary angioplasty with stent placement  07/2010  . Cataract extraction, bilateral  2007  . Hernia repair  unsure ?60's  . Cholecystectomy  02/21/2011    Procedure: LAPAROSCOPIC CHOLECYSTECTOMY WITH INTRAOPERATIVE CHOLANGIOGRAM;  Surgeon: Pedro Earls, MD;  Location: [facility];  Service: General;  Laterality: N/A;   Social History:  reports that he quit smoking about 35 years ago. His smoking use included Cigarettes. He has a 120 pack-year smoking history. He has never used smokeless tobacco. He reports that he does not drink alcohol or use illicit drugs. Patient lives at home & is able to participate in activities of daily living  Allergies  Allergen Reactions  . Ace Inhibitors Other (See Comments)    Severe asthma (COPD)  . Other Other (See Comments)    Maple trees, allergy symptoms  . Codeine Nausea And Vomiting  . Lasix [Furosemide] Itching  Family History  Problem Relation Age of Onset  . COPD Sister   . Asthma Sister   . Emphysema Sister   . Hypertension Sister   . Hyperlipidemia Sister   . Hypertension Mother   . Heart disease Mother   . Hyperlipidemia Mother   . Colon cancer Neg Hx   . Heart disease Brother   . Heart disease Father   . Hyperlipidemia Father   . Hypertension Father      Prior to Admission medications   Medication Sig Start Date End Date Taking? Authorizing Provider  albuterol (PROVENTIL HFA;VENTOLIN HFA) 108 (90 BASE) MCG/ACT inhaler Inhale 2 puffs into the  lungs every 6 (six) hours as needed for wheezing or shortness of breath. 06/24/14  Yes Deneise Lever, MD  apixaban (ELIQUIS) 5 MG TABS tablet Take 1 tablet (5 mg total) by mouth 2 (two) times daily. 10/19/14  Yes Rowe Clack, MD  budesonide (PULMICORT) 0.25 MG/2ML nebulizer solution Take 0.25 mg by nebulization at bedtime. For asthma   Yes Historical Provider, MD  calcium-vitamin D (OSCAL WITH D) 500-200 MG-UNIT per tablet Take 1 tablet by mouth 2 (two) times daily.   Yes Historical Provider, MD  cholestyramine Lucrezia Starch) 4 G packet take 1 packet by mouth twice a day if needed for LOOSE STOOLS Patient taking differently: Take 1 packet by mouth daily 09/09/14  Yes Irene Shipper, MD  cloNIDine (CATAPRES) 0.3 MG tablet Take 1 tablet (0.3 mg total) by mouth 2 (two) times daily. 09/18/14  Yes Charlynne Cousins, MD  denosumab (PROLIA) 60 MG/ML SOLN injection Inject 60 mg into the skin every 6 (six) months. Administer in upper arm, thigh, or abdomen 06/12/12  Yes Rowe Clack, MD  diltiazem (CARDIZEM CD) 120 MG 24 hr capsule Take 1 capsule (120 mg total) by mouth 2 (two) times daily. 10/13/14  Yes Rowe Clack, MD  DULERA 100-5 MCG/ACT AERO inhale 2 puffs by mouth once daily (THEN RINSE MOUTH AFTER USE) 07/14/14  Yes Rowe Clack, MD  hydrALAZINE (APRESOLINE) 50 MG tablet Take 1 tablet (50 mg total) by mouth 3 (three) times daily. Or as directed 10/19/14  Yes Rowe Clack, MD  ipratropium-albuterol (DUONEB) 0.5-2.5 (3) MG/3ML SOLN Take 3 mLs by nebulization at bedtime. 09/09/14  Yes Rowe Clack, MD  isosorbide mononitrate (IMDUR) 60 MG 24 hr tablet Take 1 tablet (60 mg total) by mouth daily. 04/22/14  Yes Rowe Clack, MD  latanoprost (XALATAN) 0.005 % ophthalmic solution Place 1 drop into both eyes at bedtime.    Yes Historical Provider, MD  LORazepam (ATIVAN) 0.5 MG tablet Take 1 tablet (0.5 mg total) by mouth 3 (three) times daily. 09/10/14  Yes Rowe Clack, MD    losartan (COZAAR) 100 MG tablet Take 1 tablet (100 mg total) by mouth every evening. 10/08/14  Yes Minus Breeding, MD  LYSINE PO Take 1 tablet by mouth daily at 12 noon.   Yes Historical Provider, MD  montelukast (SINGULAIR) 10 MG tablet Take 1 tablet (10 mg total) by mouth at bedtime. 11/11/14  Yes Rowe Clack, MD  nebivolol (BYSTOLIC) 10 MG tablet Take 1 tablet (10 mg total) by mouth daily. 06/30/14  Yes Minus Breeding, MD  nitroGLYCERIN (NITROSTAT) 0.4 MG SL tablet Place 1 tablet (0.4 mg total) under the tongue every 5 (five) minutes as needed for chest pain. 07/13/14  Yes Minus Breeding, MD  pantoprazole (PROTONIX) 40 MG tablet take 1 tablet by mouth twice a  day 10/28/14  Yes Minus Breeding, MD  potassium chloride SA (K-DUR,KLOR-CON) 20 MEQ tablet Take 1 tablet (20 mEq total) by mouth 2 (two) times daily. 10/19/14  Yes Rowe Clack, MD  pravastatin (PRAVACHOL) 80 MG tablet Take 1 tablet (80 mg total) by mouth every evening. 10/19/14  Yes Rowe Clack, MD  Probiotic Product (Cook PO) Take 1 capsule by mouth every evening.    Yes Historical Provider, MD  RA VITAMIN B-12 TR 1000 MCG TBCR take 1 tablet by mouth once daily 01/23/14  Yes Rowe Clack, MD  torsemide (DEMADEX) 20 MG tablet Take 1 tablet (20 mg total) by mouth daily. 09/18/14  Yes Charlynne Cousins, MD    Physical Exam: BP 147/65 mmHg  Pulse 114  Temp(Src) 97.6 F (36.4 C) (Oral)  Resp 20  Ht 5\' 2"  (1.575 m)  Wt 64.9 kg (143 lb 1.3 oz)  BMI 26.16 kg/m2  SpO2 97%  General: Elderly Caucasian male. Awake and alert and oriented x3. No acute cardiopulmonary distress.  Eyes: Pupils equal, round, reactive to light. Extraocular muscles are intact. Sclerae anicteric and noninjected.  ENT:  Moist mucosal membranes. No mucosal lesions.   Neck: Neck supple without lymphadenopathy. No carotid bruits. No masses palpated.  Cardiovascular: Tachycardia with normal S1-S2 sounds. No murmurs, rubs, gallops  auscultated. No JVD.  Respiratory: Good respiratory effort with no wheezes, rales, rhonchi. Lungs clear to auscultation bilaterally.  Abdomen: Soft, nontender, nondistended. Active bowel sounds. No masses or hepatosplenomegaly  Skin: Dry, warm to touch. 2+ dorsalis pedis and radial pulses. Musculoskeletal: No calf or leg pain. All major joints not erythematous nontender.  Psychiatric: Intact judgment and insight.  Neurologic: No focal neurological deficits. Cranial nerves II through XII are grossly intact.           Labs on Admission:  Basic Metabolic Panel:  Recent Labs Lab 11/24/14 2125  NA 139  K 3.7  CL 103  CO2 25  GLUCOSE 128*  BUN 13  CREATININE 1.05  CALCIUM 9.5   Liver Function Tests: No results for input(s): AST, ALT, ALKPHOS, BILITOT, PROT, ALBUMIN in the last 168 hours. No results for input(s): LIPASE, AMYLASE in the last 168 hours. No results for input(s): AMMONIA in the last 168 hours. CBC:  Recent Labs Lab 11/24/14 2125  WBC 10.1  HGB 15.8  HCT 47.2  MCV 88.9  PLT 166   Cardiac Enzymes:  Recent Labs Lab 11/24/14 2125  TROPONINI 0.15*    BNP (last 3 results)  Recent Labs  09/17/14 2200 11/24/14 2125  BNP 249.5* 648.9*    ProBNP (last 3 results) No results for input(s): PROBNP in the last 8760 hours.  CBG: No results for input(s): GLUCAP in the last 168 hours.  Radiological Exams on Admission: Dg Chest 1 View  11/24/2014   CLINICAL DATA:  Nipple markers.  EXAM: CHEST  1 VIEW  COMPARISON:  Chest x-ray from earlier the same day  FINDINGS: Bilateral nodular densities correlate with nipple markers. No evidence of pulmonary nodule.  Mild interstitial coarsening without pneumonia or edema. Normal heart size and mediastinal contours post CABG.  Chronic widening of the left anterior fourth rib.  IMPRESSION: Bilateral nipple shadows.  No evidence of pulmonary nodule.   Electronically Signed   By: Monte Fantasia M.D.   On: 11/24/2014 23:50   Dg  Chest 2 View  11/24/2014   CLINICAL DATA:  Heart palpitations with elevated blood pressure and heart rate  EXAM: CHEST  2 VIEW  COMPARISON:  10/09/2014  FINDINGS: Heart size upper normal status post CABG. Stable aortic calcification. Vascular pattern normal. Mild mid to lower lung zone interstitial change likely scarring, stable. New no pleural effusion. Hyperinflation consistent with COPD.  Pulmonary nodular opacity over the right lung base measures about 7 mm. This may represent nipple shadow.  IMPRESSION: Pulmonary nodule versus nipple shadow. Suggest repeating the PA view with nipple markers.  Chronic interstitial change hyperinflation and aortic atherosclerosis with no other acute findings.   Electronically Signed   By: Skipper Cliche M.D.   On: 11/24/2014 21:58   Ct Angio Chest Pe W/cm &/or Wo Cm  11/25/2014   CLINICAL DATA:  Chest tightness and vomiting.  EXAM: CT ANGIOGRAPHY CHEST WITH CONTRAST  TECHNIQUE: Multidetector CT imaging of the chest was performed using the standard protocol during bolus administration of intravenous contrast. Multiplanar CT image reconstructions and MIPs were obtained to evaluate the vascular anatomy.  CONTRAST:  11mL OMNIPAQUE IOHEXOL 350 MG/ML SOLN  COMPARISON:  12/09/2013  FINDINGS: The extreme apices are excluded from view but this does not affect pulmonary artery evaluation.  THORACIC INLET/BODY WALL:  No acute abnormality.  MEDIASTINUM:  Mild cardiomegaly. No pericardial effusion. Extensive atherosclerosis, status post CABG. Graft evaluation limited due to faint systemic arterial opacification on this PE study. No aortic aneurysm or dissection. There is mild to moderate aortic valve calcifications/ sclerosis.  No pulmonary artery filling defect.  Small sliding hiatal hernia.  LUNG WINDOWS:  No consolidation.  No effusion.  Mild centrilobular emphysema.  No suspicious pulmonary nodule.  UPPER ABDOMEN:  No acute findings.  Scatter low-density liver lesions consistent  with cysts.  OSSEOUS:  No acute fracture.  No suspicious lytic or blastic lesions.  Diffuse spondylosis.  Review of the MIP images confirms the above findings.  IMPRESSION: 1. No evidence of pulmonary embolism or other acute disease. 2. Emphysema and advanced atherosclerosis.   Electronically Signed   By: Monte Fantasia M.D.   On: 11/25/2014 02:11    EKG: Independently reviewed. Sinus tachycardia. Q waves in V1 and V2 suggestive of old anterior infarct. no acute ST changes.  Assessment/Plan Present on Admission:  . Chest pain . Tachycardia . Hypertensive urgency  This patient was discussed with the ED physician, including pertinent vitals, physical exam findings, labs, and imaging.  We also discussed care given by the ED provider.  #1 chest pain  Admit to telemetry  Rule out with serial troponins  Patient currently chest pain-free  nitroglycerin and morphine for chest pain #2 tachycardia  Continue diltiazem and beta blocker now #3 hypertensive urgency  Continue home antihypertensives #4 acute on chronic diastolic heart failure #5 paroxysmal atrial fibrillation  Continue Eliquis #6 dyslipidemia  Continue pravastatin  DVT prophylaxis: On Eliquis  Consultants: Cardiology - appreciate consult  Code Status: Full code  Family Communication: Wife in the room   Disposition Plan: Admit to telemetry  Truett Mainland, DO [facility] Pager [phone]

## 2014-11-25 NOTE — Progress Notes (Signed)
BP 112/62 mmHg  Pulse 86  Temp(Src) 98.1 F (36.7 C) (Oral)  Resp 20  Ht 5\' 2"  (1.575 m)  Wt 64.9 kg (143 lb 1.3 oz)  BMI 26.16 kg/m2  SpO2 98% Agree with plan as stated by Dr. Nehemiah Settle. Cardiology consulted for cardiac cath in am.

## 2014-11-25 NOTE — ED Notes (Signed)
Pt back from CT

## 2014-11-25 NOTE — ED Notes (Signed)
Attempted to give report. Floor refused to take pt until BP dec

## 2014-11-25 NOTE — ED Notes (Signed)
Pt reports no more chest tightness after admin of 1NTG

## 2014-11-25 NOTE — ED Notes (Signed)
Pt to CT

## 2014-11-25 NOTE — Progress Notes (Signed)
Report given from er nurse. Made nurse aware of pt bp being elevated 190/96, pt just received 2nd nitroglycerin per MAR. Troponin elevated pt vomitted after CTS. Asked nurse to call md for medication before sending pt to floor.

## 2014-11-25 NOTE — Consult Note (Addendum)
Reason for Consult: hypertension, mildly elevated troponin, paroxysmal atrial fibrillation Primary Cardiologist: [name]  Referring Physician: Dr. Merrilee Hickman. is an 81 y.o. male with PMH of CAD s/p CABG '08, PCI 12, paroxysmal atrial fibrillation on apixiban who developed headache, chest tightness and nausea that improve with resting and SL yesterday working in the lawn starting at about 6 pm. He tried to work in his yard again and symptoms returned leading him to check his blood pressure and find it to be 190-200 leading him to present to urgent care. He continued to feel "off" leading to his presentation to urgent care. He tells me he stayed awake late in the evening worried about his heart rate and blood pressure trying to get them down. He is accompanied by his wife and it sounds like there was a power surge a week ago and he's been a bit more on edge since then. He doesn't have frequent chest pain at home. He was most recently seen by Dr. Ron Hickman 09/18/14 during his hospitalization for hypertensive urgency. There was concern at that time that he may not be  Compliant all of the time with his blood pressure medications because his blood pressure was quickly controlled. He had clonidine changed to three times daily and torsemide daily.       Past Medical History  Diagnosis Date  . CORONARY HEART DISEASE     a. s/p CABG;  b. cath 06/27/10: S-Dx occluded, S-PDA 80-90% (tx with PCI); S-OM ok, L-LAD ok;  EF 65-70%  c.  s/p Promus DES to S-PDA 06/2010;   d. Myoview 8/12: low risk  . FIBRILLATION, ATRIAL     post op; ?documented during hosp. 06/2010  . SLEEP APNEA 09/2001    NPSG AHI 22/HR  . ALLERGIC RHINITIS   . ASTHMA   . Depression   . GERD     with HH, hx esophageal stricture  . DYSLIPIDEMIA   . HYPERTENSION     Echo 3/12: EF 55-60%; mod LVH; mild AS/AI; LAE; PASP 38; mild pulmo HTN  . Personal history of alcoholism   . Diverticulosis   . Benign liver cyst   . Skin cancer     L forearm  . Cataract     surgery to both eyes  . Esophageal stricture   . Hiatal hernia   . Pneumonia 04/2013 hosp  . Allergy   . Anxiety   . Arthritis   . CHF (congestive heart failure)   . COPD (chronic obstructive pulmonary disease)   . Glaucoma   . Osteoporosis   . History of heart attack     Past Surgical History  Procedure Laterality Date  . Coronary artery bypass graft  02/2007  . Tonsillectomy    . Coronary angioplasty with stent placement  07/2010  . Cataract extraction, bilateral  2007  . Hernia repair  unsure ?60's  . Cholecystectomy  02/21/2011    Procedure: LAPAROSCOPIC CHOLECYSTECTOMY WITH INTRAOPERATIVE CHOLANGIOGRAM;  Surgeon: Stephen Earls, MD;  Location: [facility];  Service: General;  Laterality: N/A;    Family History  Problem Relation Age of Onset  . COPD Sister   . Asthma Sister   . Emphysema Sister   . Hypertension Sister   . Hyperlipidemia Sister   . Hypertension Mother   . Heart disease Mother   . Hyperlipidemia Mother   . Colon cancer Neg Hx   . Heart disease Brother   . Heart disease Father   .  Hyperlipidemia Father   . Hypertension Father     Social History:  reports that he quit smoking about 35 years ago. His smoking use included Cigarettes. He has a 120 pack-year smoking history. He has never used smokeless tobacco. He reports that he does not drink alcohol or use illicit drugs.  Allergies:  Allergies  Allergen Reactions  . Ace Inhibitors Other (See Comments)    Severe asthma (COPD)  . Other Other (See Comments)    Maple trees, allergy symptoms  . Codeine Nausea And Vomiting  . Lasix [Furosemide] Itching    Medications:  I have reviewed the patient's current medications. Prior to Admission:  Prescriptions prior to admission  Medication Sig Dispense Refill Last Dose  . albuterol (PROVENTIL HFA;VENTOLIN HFA) 108 (90 BASE) MCG/ACT inhaler Inhale 2 puffs into the lungs every 6 (six) hours as needed for wheezing or shortness of  breath. 8 g 5 11/24/2014 at Unknown time  . apixaban (ELIQUIS) 5 MG TABS tablet Take 1 tablet (5 mg total) by mouth 2 (two) times daily. 60 tablet 5 11/24/2014 at am/pm  . budesonide (PULMICORT) 0.25 MG/2ML nebulizer solution Take 0.25 mg by nebulization at bedtime. For asthma   11/24/2014 at Unknown time  . calcium-vitamin D (OSCAL WITH D) 500-200 MG-UNIT per tablet Take 1 tablet by mouth 2 (two) times daily.   11/24/2014 at Unknown time  . cholestyramine (QUESTRAN) 4 G packet take 1 packet by mouth twice a day if needed for LOOSE STOOLS (Patient taking differently: Take 1 packet by mouth daily) 60 each 2 11/24/2014 at Unknown time  . cloNIDine (CATAPRES) 0.3 MG tablet Take 1 tablet (0.3 mg total) by mouth 2 (two) times daily. 60 tablet 0 11/24/2014 at Unknown time  . denosumab (PROLIA) 60 MG/ML SOLN injection Inject 60 mg into the skin every 6 (six) months. Administer in upper arm, thigh, or abdomen 1 mL 5 unknown  . diltiazem (CARDIZEM CD) 120 MG 24 hr capsule Take 1 capsule (120 mg total) by mouth 2 (two) times daily. 180 capsule 3 11/24/2014 at Unknown time  . DULERA 100-5 MCG/ACT AERO inhale 2 puffs by mouth once daily (THEN RINSE MOUTH AFTER USE) 17.6 g 5 11/24/2014 at Unknown time  . hydrALAZINE (APRESOLINE) 50 MG tablet Take 1 tablet (50 mg total) by mouth 3 (three) times daily. Or as directed 90 tablet 5 11/24/2014 at Unknown time  . ipratropium-albuterol (DUONEB) 0.5-2.5 (3) MG/3ML SOLN Take 3 mLs by nebulization at bedtime. 360 mL 6 11/24/2014 at Unknown time  . isosorbide mononitrate (IMDUR) 60 MG 24 hr tablet Take 1 tablet (60 mg total) by mouth daily. 90 tablet 3 11/24/2014 at Unknown time  . latanoprost (XALATAN) 0.005 % ophthalmic solution Place 1 drop into both eyes at bedtime.    11/24/2014 at Unknown time  . LORazepam (ATIVAN) 0.5 MG tablet Take 1 tablet (0.5 mg total) by mouth 3 (three) times daily. 90 tablet 5 11/24/2014 at Unknown time  . losartan (COZAAR) 100 MG tablet Take 1 tablet (100 mg  total) by mouth every evening. 90 tablet 3 11/24/2014 at Unknown time  . LYSINE PO Take 1 tablet by mouth daily at 12 noon.   11/24/2014 at Unknown time  . montelukast (SINGULAIR) 10 MG tablet Take 1 tablet (10 mg total) by mouth at bedtime. 90 tablet 3 11/24/2014 at Unknown time  . nebivolol (BYSTOLIC) 10 MG tablet Take 1 tablet (10 mg total) by mouth daily. 30 tablet 6 11/24/2014 at Unknown time  .  nitroGLYCERIN (NITROSTAT) 0.4 MG SL tablet Place 1 tablet (0.4 mg total) under the tongue every 5 (five) minutes as needed for chest pain. 25 tablet 0 unknown  . pantoprazole (PROTONIX) 40 MG tablet take 1 tablet by mouth twice a day 60 tablet 1 11/24/2014 at Unknown time  . potassium chloride SA (K-DUR,KLOR-CON) 20 MEQ tablet Take 1 tablet (20 mEq total) by mouth 2 (two) times daily. 60 tablet 6 11/24/2014 at Unknown time  . pravastatin (PRAVACHOL) 80 MG tablet Take 1 tablet (80 mg total) by mouth every evening. 30 tablet 2 11/24/2014 at Unknown time  . Probiotic Product (PHILLIPS COLON HEALTH PO) Take 1 capsule by mouth every evening.    11/24/2014 at Unknown time  . RA VITAMIN B-12 TR 1000 MCG TBCR take 1 tablet by mouth once daily 30 tablet 11 11/24/2014 at Unknown time  . torsemide (DEMADEX) 20 MG tablet Take 1 tablet (20 mg total) by mouth daily. 30 tablet 0 11/24/2014 at Unknown time   Scheduled: . hydrALAZINE  10 mg Intravenous Once    Results for orders placed or performed during the hospital encounter of 11/24/14 (from the past 48 hour(s))  Basic metabolic panel     Status: Abnormal   Collection Time: 11/24/14  9:25 PM  Result Value Ref Range   Sodium 139 135 - 145 mmol/L   Potassium 3.7 3.5 - 5.1 mmol/L   Chloride 103 101 - 111 mmol/L   CO2 25 22 - 32 mmol/L   Glucose, Bld 128 (H) 65 - 99 mg/dL   BUN 13 6 - 20 mg/dL   Creatinine, Ser 1.05 0.61 - 1.24 mg/dL   Calcium 9.5 8.9 - 10.3 mg/dL   GFR calc non Af Amer >60 >60 mL/min   GFR calc Af Amer >60 >60 mL/min    Comment: (NOTE) The eGFR has  been calculated using the CKD EPI equation. This calculation has not been validated in all clinical situations. eGFR's persistently <60 mL/min signify possible Chronic Kidney Disease.    Anion gap 11 5 - 15  CBC     Status: None   Collection Time: 11/24/14  9:25 PM  Result Value Ref Range   WBC 10.1 4.0 - 10.5 K/uL   RBC 5.31 4.22 - 5.81 MIL/uL   Hemoglobin 15.8 13.0 - 17.0 g/dL   HCT 47.2 39.0 - 52.0 %   MCV 88.9 78.0 - 100.0 fL   MCH 29.8 26.0 - 34.0 pg   MCHC 33.5 30.0 - 36.0 g/dL   RDW 15.1 11.5 - 15.5 %   Platelets 166 150 - 400 K/uL  Brain natriuretic peptide     Status: Abnormal   Collection Time: 11/24/14  9:25 PM  Result Value Ref Range   B Natriuretic Peptide 648.9 (H) 0.0 - 100.0 pg/mL  Troponin I     Status: Abnormal   Collection Time: 11/24/14  9:25 PM  Result Value Ref Range   Troponin I 0.15 (H) <0.031 ng/mL    Comment:        PERSISTENTLY INCREASED TROPONIN VALUES IN THE RANGE OF 0.04-0.49 ng/mL CAN BE SEEN IN:       -UNSTABLE ANGINA       -CONGESTIVE HEART FAILURE       -MYOCARDITIS       -CHEST TRAUMA       -ARRYHTHMIAS       -LATE PRESENTING MYOCARDIAL INFARCTION       -COPD   CLINICAL FOLLOW-UP RECOMMENDED.   I-stat troponin,  ED     Status: Abnormal   Collection Time: 11/24/14  9:30 PM  Result Value Ref Range   Troponin i, poc 0.15 (HH) 0.00 - 0.08 ng/mL   Comment NOTIFIED PHYSICIAN    Comment 3            Comment: Due to the release kinetics of cTnI, a negative result within the first hours of the onset of symptoms does not rule out myocardial infarction with certainty. If myocardial infarction is still suspected, repeat the test at appropriate intervals.   D-dimer, quantitative (not at [facility])     Status: Abnormal   Collection Time: 11/24/14 11:23 PM  Result Value Ref Range   D-Dimer, Quant 0.54 (H) 0.00 - 0.48 ug/mL-FEU    Comment:        AT THE INHOUSE ESTABLISHED CUTOFF VALUE OF 0.48 ug/mL FEU, THIS ASSAY HAS BEEN DOCUMENTED IN THE  LITERATURE TO HAVE A SENSITIVITY AND NEGATIVE PREDICTIVE VALUE OF AT LEAST 98 TO 99%.  THE TEST RESULT SHOULD BE CORRELATED WITH AN ASSESSMENT OF THE CLINICAL PROBABILITY OF DVT / VTE.     Dg Chest 1 View  11/24/2014   CLINICAL DATA:  Nipple markers.  EXAM: CHEST  1 VIEW  COMPARISON:  Chest x-ray from earlier the same day  FINDINGS: Bilateral nodular densities correlate with nipple markers. No evidence of pulmonary nodule.  Mild interstitial coarsening without pneumonia or edema. Normal heart size and mediastinal contours post CABG.  Chronic widening of the left anterior fourth rib.  IMPRESSION: Bilateral nipple shadows.  No evidence of pulmonary nodule.   Electronically Signed   By: Monte Fantasia M.D.   On: 11/24/2014 23:50   Dg Chest 2 View  11/24/2014   CLINICAL DATA:  Heart palpitations with elevated blood pressure and heart rate  EXAM: CHEST  2 VIEW  COMPARISON:  10/09/2014  FINDINGS: Heart size upper normal status post CABG. Stable aortic calcification. Vascular pattern normal. Mild mid to lower lung zone interstitial change likely scarring, stable. New no pleural effusion. Hyperinflation consistent with COPD.  Pulmonary nodular opacity over the right lung base measures about 7 mm. This may represent nipple shadow.  IMPRESSION: Pulmonary nodule versus nipple shadow. Suggest repeating the PA view with nipple markers.  Chronic interstitial change hyperinflation and aortic atherosclerosis with no other acute findings.   Electronically Signed   By: Skipper Cliche M.D.   On: 11/24/2014 21:58   Ct Angio Chest Pe W/cm &/or Wo Cm  11/25/2014   CLINICAL DATA:  Chest tightness and vomiting.  EXAM: CT ANGIOGRAPHY CHEST WITH CONTRAST  TECHNIQUE: Multidetector CT imaging of the chest was performed using the standard protocol during bolus administration of intravenous contrast. Multiplanar CT image reconstructions and MIPs were obtained to evaluate the vascular anatomy.  CONTRAST:  149m OMNIPAQUE  IOHEXOL 350 MG/ML SOLN  COMPARISON:  12/09/2013  FINDINGS: The extreme apices are excluded from view but this does not affect pulmonary artery evaluation.  THORACIC INLET/BODY WALL:  No acute abnormality.  MEDIASTINUM:  Mild cardiomegaly. No pericardial effusion. Extensive atherosclerosis, status post CABG. Graft evaluation limited due to faint systemic arterial opacification on this PE study. No aortic aneurysm or dissection. There is mild to moderate aortic valve calcifications/ sclerosis.  No pulmonary artery filling defect.  Small sliding hiatal hernia.  LUNG WINDOWS:  No consolidation.  No effusion.  Mild centrilobular emphysema.  No suspicious pulmonary nodule.  UPPER ABDOMEN:  No acute findings.  Scatter low-density liver lesions consistent with cysts.  OSSEOUS:  No acute fracture.  No suspicious lytic or blastic lesions.  Diffuse spondylosis.  Review of the MIP images confirms the above findings.  IMPRESSION: 1. No evidence of pulmonary embolism or other acute disease. 2. Emphysema and advanced atherosclerosis.   Electronically Signed   By: Monte Fantasia M.D.   On: 11/25/2014 02:11    Review of Systems  Constitutional: Negative for fever and chills.  HENT: Positive for hearing loss. Negative for ear pain and tinnitus.   Eyes: Negative for double vision and photophobia.  Respiratory: Negative for cough and shortness of breath.   Cardiovascular: Positive for chest pain. Negative for orthopnea, claudication and leg swelling.  Gastrointestinal: Positive for nausea. Negative for vomiting and abdominal pain.  Genitourinary: Negative for dysuria and urgency.  Musculoskeletal: Negative for myalgias and neck pain.  Skin: Negative for rash.  Neurological: Positive for headaches. Negative for dizziness, tingling and tremors.  Endo/Heme/Allergies: Negative for polydipsia. Bruises/bleeds easily.  Psychiatric/Behavioral: Negative for depression, suicidal ideas and substance abuse. The patient is  nervous/anxious.    Blood pressure 147/65, pulse 114, temperature 97.6 F (36.4 C), temperature source Oral, resp. rate 20, height 5' 2"  (1.575 m), weight 64.9 kg (143 lb 1.3 oz), SpO2 97 %. Physical Exam  Nursing note and vitals reviewed. Constitutional: He is oriented to person, place, and time. He appears well-nourished. No distress.  HENT:  Head: Normocephalic and atraumatic.  Nose: Nose normal.  Mouth/Throat: Oropharynx is clear and moist. No oropharyngeal exudate.  Eyes: Conjunctivae and EOM are normal. Pupils are equal, round, and reactive to light. No scleral icterus.  Neck: Normal range of motion. Neck supple. JVD present. No tracheal deviation present.  JVP 2 cm above clavicle  Cardiovascular: Regular rhythm, normal heart sounds and intact distal pulses.  Exam reveals no gallop.   No murmur heard. tachycardia  Respiratory: Effort normal and breath sounds normal. No respiratory distress. He has no wheezes. He has no rales.  GI: Soft. Bowel sounds are normal. He exhibits no distension. There is no tenderness. There is no rebound.  Musculoskeletal: Normal range of motion. He exhibits no edema or tenderness.  Neurological: He is alert and oriented to person, place, and time. No cranial nerve deficit. Coordination normal.  Skin: Skin is warm and dry. No rash noted. He is not diaphoretic. No erythema.  Psychiatric: His behavior is normal. Thought content normal.  Mildly anxious   Labs reviewed; ECG afib + RVR, anterior infarct, second ECG ST vs. EAT with anterior infarct age indeterminate 6/16 Echo with EF 55-60%, grade II DD, mils AS, moderate TR, PASP 48 mmHg Trop 0.15, CTA negative PE, BNP 649, K 3.7, Cr 1.05 Assessment/Plan:  [name]. is an 81 y.o. male with PMH of CAD s/p CABG '08, PCI 12, paroxysmal atrial fibrillation on apixiban who developed headache, chest tightness and nausea that improve with resting and SL yesterday. Differential for his chest discomfort is  hypertension, acs, musculoskeletal pain among others. He appears to have an anxiety component and I'm not sure he is taking all of his medications. His wife said they had been titrating off his hydralazine and his blood pressure has normalized. He and she also acknowledge he gets amped up pretty easily as do many folks in his family. He feels great when I chatted with him and the entire time he's been in the hospital. Will plan to observe on telemetry, trend troponins at least once and restart medications. Needs counseling, anxiety control most likely  and good medicine compliance. Mild tachycardia may also be clonidine withdrawal.  Problem List Hypertensive Urgency Chest Pain CAD with previous CABG Acute on chronic diastolic heart failure Paroxysmal atrial fibrillation Dyslipidemia  Plan 1. Continue home diltiazem 120 mg bid, imdur 60 mg daily, losartan 100 mg daily, nebivolol 10 mg daily 2. Continue apixaban 5 mg bid 3. Take 81 mg daily asa  4. Continue pravastatin  5. Consider stress management  6. Telemetry, trend cardiac biomarkers   Hadlea Furuya 11/25/2014, 5:34 AM

## 2014-11-25 NOTE — ED Notes (Signed)
Pt started reporting chest tightness and began vomiting. EDP notified and at bedside.

## 2014-11-25 NOTE — Progress Notes (Signed)
Patient ID: Stephen Door., male   DOB: 1930/08/02, 81 y.o.   MRN: [id number]    Subjective:  Still on edge no chest pain   Objective:  Filed Vitals:   11/25/14 0221 11/25/14 0300 11/25/14 0344 11/25/14 0728  BP: 145/68 196/89 147/65   Pulse: 113 124 114   Temp:  97.6 F (36.4 C)    TempSrc:  Oral    Resp: 16 20    Height:  5\' 2"  (1.575 m)    Weight:  64.9 kg (143 lb 1.3 oz)    SpO2: 95% 97%  97%    Intake/Output from previous day:  Intake/Output Summary (Last 24 hours) at 11/25/14 4967 Last data filed at 11/25/14 5916  Gross per 24 hour  Intake    220 ml  Output    750 ml  Net   -530 ml    Physical Exam: Affect appropriate Elderly anxious male  HEENT: normal Neck supple with no adenopathy JVP normal no bruits no thyromegaly Lungs clear with no wheezing and good diaphragmatic motion Heart:  S1/S2 SEM murmur, no rub, gallop or click PMI normal Abdomen: benighn, BS positve, no tenderness, no AAA no bruit.  No HSM or HJR Distal pulses intact with no bruits No edema Neuro non-focal Skin warm and dry No muscular weakness   Lab Results: Basic Metabolic Panel:  Recent Labs  11/24/14 2125  NA 139  K 3.7  CL 103  CO2 25  GLUCOSE 128*  BUN 13  CREATININE 1.05  CALCIUM 9.5   CBC:  Recent Labs  11/24/14 2125  WBC 10.1  HGB 15.8  HCT 47.2  MCV 88.9  PLT 166   Cardiac Enzymes:  Recent Labs  11/24/14 2125 11/25/14 0625  TROPONINI 0.15* 0.26*   BNP: Invalid input(s): POCBNP D-Dimer:  Recent Labs  11/24/14 2323  DDIMER 0.54*    Imaging: Dg Chest 1 View  11/24/2014   CLINICAL DATA:  Nipple markers.  EXAM: CHEST  1 VIEW  COMPARISON:  Chest x-ray from earlier the same day  FINDINGS: Bilateral nodular densities correlate with nipple markers. No evidence of pulmonary nodule.  Mild interstitial coarsening without pneumonia or edema. Normal heart size and mediastinal contours post CABG.  Chronic widening of the left anterior fourth rib.   IMPRESSION: Bilateral nipple shadows.  No evidence of pulmonary nodule.   Electronically Signed   By: Monte Fantasia M.D.   On: 11/24/2014 23:50   Dg Chest 2 View  11/24/2014   CLINICAL DATA:  Heart palpitations with elevated blood pressure and heart rate  EXAM: CHEST  2 VIEW  COMPARISON:  10/09/2014  FINDINGS: Heart size upper normal status post CABG. Stable aortic calcification. Vascular pattern normal. Mild mid to lower lung zone interstitial change likely scarring, stable. New no pleural effusion. Hyperinflation consistent with COPD.  Pulmonary nodular opacity over the right lung base measures about 7 mm. This may represent nipple shadow.  IMPRESSION: Pulmonary nodule versus nipple shadow. Suggest repeating the PA view with nipple markers.  Chronic interstitial change hyperinflation and aortic atherosclerosis with no other acute findings.   Electronically Signed   By: Skipper Cliche M.D.   On: 11/24/2014 21:58   Ct Angio Chest Pe W/cm &/or Wo Cm  11/25/2014   CLINICAL DATA:  Chest tightness and vomiting.  EXAM: CT ANGIOGRAPHY CHEST WITH CONTRAST  TECHNIQUE: Multidetector CT imaging of the chest was performed using the standard protocol during bolus administration of intravenous contrast. Multiplanar CT image reconstructions  and MIPs were obtained to evaluate the vascular anatomy.  CONTRAST:  143mL OMNIPAQUE IOHEXOL 350 MG/ML SOLN  COMPARISON:  12/09/2013  FINDINGS: The extreme apices are excluded from view but this does not affect pulmonary artery evaluation.  THORACIC INLET/BODY WALL:  No acute abnormality.  MEDIASTINUM:  Mild cardiomegaly. No pericardial effusion. Extensive atherosclerosis, status post CABG. Graft evaluation limited due to faint systemic arterial opacification on this PE study. No aortic aneurysm or dissection. There is mild to moderate aortic valve calcifications/ sclerosis.  No pulmonary artery filling defect.  Small sliding hiatal hernia.  LUNG WINDOWS:  No consolidation.  No  effusion.  Mild centrilobular emphysema.  No suspicious pulmonary nodule.  UPPER ABDOMEN:  No acute findings.  Scatter low-density liver lesions consistent with cysts.  OSSEOUS:  No acute fracture.  No suspicious lytic or blastic lesions.  Diffuse spondylosis.  Review of the MIP images confirms the above findings.  IMPRESSION: 1. No evidence of pulmonary embolism or other acute disease. 2. Emphysema and advanced atherosclerosis.   Electronically Signed   By: Monte Fantasia M.D.   On: 11/25/2014 02:11    Cardiac Studies:  ECG:    Telemetry:  SR  ST  11/25/2014   Echo: 09/29/14    Impressions:  - Normal LV size with mild LV hypertrophy. EF 55-60% with moderate diastolic dysfunction. Normal RV size and systolic function. Mild pulmonary hypertension. There was mild to moderate aortic stenosis (mild by mean gradient, moderate by calculated valve area.  Medications:   . apixaban  5 mg Oral BID  . budesonide  0.25 mg Nebulization QHS  . calcium-vitamin D  1 tablet Oral BID  . cholestyramine  4 g Oral Daily  . cloNIDine  0.3 mg Oral BID  . diltiazem  120 mg Oral BID  . hydrALAZINE  50 mg Oral TID  . ipratropium-albuterol  3 mL Nebulization QHS  . isosorbide mononitrate  60 mg Oral Daily  . latanoprost  1 drop Both Eyes QHS  . LORazepam  0.5 mg Oral TID  . losartan  100 mg Oral QPM  . mometasone-formoterol  2 puff Inhalation BID  . montelukast  10 mg Oral QHS  . nebivolol  10 mg Oral Daily  . pantoprazole  40 mg Oral BID  . potassium chloride SA  20 mEq Oral BID  . pravastatin  80 mg Oral QPM  . torsemide  20 mg Oral Daily  . vitamin B-12  1,000 mcg Oral Daily       Assessment/Plan:  Chest Pain:  CABG with PCI SVG 2012.  R/O HTN/Tachycardic.  Discussed with patient Given distant CABG and history of failed graft with stenting favor repeat cath On Eliquis held have arranged cath with Dr Burt Knack for tomorrow afternoon.  Orders written.   HTN:  Continue current meds on bystolic  change to metoprolol as still tachycardic Anxiety:  On lorazepam consider adding SSRI per primary service Chol:  On statin PAF:  NSR eliquis on hold for cath  Jenkins Rouge 11/25/2014, 8:14 AM

## 2014-11-25 NOTE — ED Notes (Signed)
Upon arrival back to unit pt reported chest tightness again and had another episode of N/V. Gave 1NTG, pt then reported no more chest tightness.

## 2014-11-26 ENCOUNTER — Encounter (HOSPITAL_COMMUNITY)
Admission: EM | Disposition: A | Discharge status: Home / Self Care | Payer: Medicare Other | Source: Home / Self Care | Attending: Internal Medicine

## 2014-11-26 ENCOUNTER — Encounter (HOSPITAL_COMMUNITY): Payer: Self-pay | Admitting: Cardiovascular Disease

## 2014-11-26 DIAGNOSIS — I10 Essential (primary) hypertension: Secondary | ICD-10-CM

## 2014-11-26 DIAGNOSIS — I214 Non-ST elevation (NSTEMI) myocardial infarction: Secondary | ICD-10-CM | POA: Insufficient documentation

## 2014-11-26 DIAGNOSIS — R0789 Other chest pain: Secondary | ICD-10-CM

## 2014-11-26 DIAGNOSIS — I2510 Atherosclerotic heart disease of native coronary artery without angina pectoris: Secondary | ICD-10-CM

## 2014-11-26 DIAGNOSIS — R000 Tachycardia, unspecified: Secondary | ICD-10-CM

## 2014-11-26 HISTORY — PX: CARDIAC CATHETERIZATION: SHX172

## 2014-11-26 LAB — POCT ACTIVATED CLOTTING TIME: Activated Clotting Time: 392 seconds

## 2014-11-26 SURGERY — LEFT HEART CATH AND CORS/GRAFTS ANGIOGRAPHY

## 2014-11-26 MED ORDER — HEPARIN (PORCINE) IN NACL 2-0.9 UNIT/ML-% IJ SOLN
INTRAMUSCULAR | Status: AC
  Filled 2014-11-26: qty 1500

## 2014-11-26 MED ORDER — SODIUM CHLORIDE 0.9 % IJ SOLN
3.0000 mL | Freq: Two times a day (BID) | INTRAMUSCULAR | Status: DC
  Administered 2014-11-26 – 2014-11-27 (×2): 3 mL via INTRAVENOUS

## 2014-11-26 MED ORDER — FENTANYL CITRATE (PF) 100 MCG/2ML IJ SOLN
INTRAMUSCULAR | Status: AC
  Filled 2014-11-26: qty 4

## 2014-11-26 MED ORDER — CLOPIDOGREL BISULFATE 75 MG PO TABS
75.0000 mg | ORAL_TABLET | Freq: Every day | ORAL | Status: DC
  Administered 2014-11-27: 10:00:00 75 mg via ORAL
  Filled 2014-11-26: qty 1

## 2014-11-26 MED ORDER — MIDAZOLAM HCL 2 MG/2ML IJ SOLN
INTRAMUSCULAR | Status: DC | PRN
  Administered 2014-11-26: 1 mg via INTRAVENOUS

## 2014-11-26 MED ORDER — FENTANYL CITRATE (PF) 100 MCG/2ML IJ SOLN
INTRAMUSCULAR | Status: DC | PRN
  Administered 2014-11-26: 25 ug via INTRAVENOUS

## 2014-11-26 MED ORDER — BIVALIRUDIN 250 MG IV SOLR
INTRAVENOUS | Status: AC
  Filled 2014-11-26: qty 250

## 2014-11-26 MED ORDER — SODIUM CHLORIDE 0.9 % IJ SOLN: 3.0000 mL | INTRAMUSCULAR | Status: DC | PRN

## 2014-11-26 MED ORDER — SODIUM CHLORIDE 0.9 % IV SOLN
INTRAVENOUS | Status: AC
  Administered 2014-11-26: 14:00:00 via INTRAVENOUS

## 2014-11-26 MED ORDER — BIVALIRUDIN BOLUS VIA INFUSION - CUPID
INTRAVENOUS | Status: DC | PRN
  Administered 2014-11-26: 48.675 mg via INTRAVENOUS

## 2014-11-26 MED ORDER — SODIUM CHLORIDE 0.9 % IV SOLN
250.0000 mg | INTRAVENOUS | Status: DC | PRN
  Administered 2014-11-26: 1.75 mg/kg/h via INTRAVENOUS

## 2014-11-26 MED ORDER — MIDAZOLAM HCL 2 MG/2ML IJ SOLN
INTRAMUSCULAR | Status: AC
  Filled 2014-11-26: qty 4

## 2014-11-26 MED ORDER — NITROGLYCERIN 1 MG/10 ML FOR IR/CATH LAB
INTRA_ARTERIAL | Status: AC
  Filled 2014-11-26: qty 10

## 2014-11-26 MED ORDER — CLOPIDOGREL BISULFATE 75 MG PO TABS
ORAL_TABLET | ORAL | Status: DC | PRN
  Administered 2014-11-26: 600 mg via ORAL

## 2014-11-26 MED ORDER — CLOPIDOGREL BISULFATE 300 MG PO TABS
ORAL_TABLET | ORAL | Status: AC
  Filled 2014-11-26: qty 2

## 2014-11-26 MED ORDER — SODIUM CHLORIDE 0.9 % IV SOLN
INTRAVENOUS | Status: DC | PRN
  Administered 2014-11-26: 999 mL via INTRAVENOUS

## 2014-11-26 MED ORDER — LIDOCAINE HCL (PF) 1 % IJ SOLN
INTRAMUSCULAR | Status: DC | PRN
  Administered 2014-11-26: 15 mL

## 2014-11-26 MED ORDER — LIDOCAINE HCL (PF) 1 % IJ SOLN
INTRAMUSCULAR | Status: AC
  Filled 2014-11-26: qty 30

## 2014-11-26 MED ORDER — IOHEXOL 350 MG/ML SOLN
INTRAVENOUS | Status: DC | PRN
  Administered 2014-11-26: 125 mL via INTRAVENOUS
  Administered 2014-11-26: 100 mL via INTRAVENOUS

## 2014-11-26 MED ORDER — SODIUM CHLORIDE 0.9 % IV SOLN: 250.0000 mL | INTRAVENOUS | Status: DC | PRN

## 2014-11-26 SURGICAL SUPPLY — 19 items
BALLN TREK RX 2.5X15 (BALLOONS) ×3
BALLN ~~LOC~~ EMERGE MR 3.25X12 (BALLOONS) ×3
BALLOON TREK RX 2.5X15 (BALLOONS) IMPLANT
BALLOON ~~LOC~~ EMERGE MR 3.25X12 (BALLOONS) IMPLANT
CATH INFINITI 5FR MULTPACK ANG (CATHETERS) ×2 IMPLANT
CATH VISTA GUIDE RCB (CATHETERS) ×2 IMPLANT
DEVICE CLOSURE PERCLS PRGLD 6F (VASCULAR PRODUCTS) IMPLANT
KIT ENCORE 26 ADVANTAGE (KITS) ×2 IMPLANT
KIT HEART LEFT (KITS) ×3 IMPLANT
PACK CARDIAC CATHETERIZATION (CUSTOM PROCEDURE TRAY) ×3 IMPLANT
PERCLOSE PROGLIDE 6F (VASCULAR PRODUCTS) ×3
SHEATH PINNACLE 5F 10CM (SHEATH) ×2 IMPLANT
SHEATH PINNACLE 6F 10CM (SHEATH) ×2 IMPLANT
STENT SYNERGY DES 3X16 (Permanent Stent) ×2 IMPLANT
SYR MEDRAD MARK V 150ML (SYRINGE) ×3 IMPLANT
TRANSDUCER W/STOPCOCK (MISCELLANEOUS) ×3 IMPLANT
TUBING CIL FLEX 10 FLL-RA (TUBING) ×3 IMPLANT
WIRE COUGAR XT STRL 190CM (WIRE) ×2 IMPLANT
WIRE EMERALD 3MM-J .035X150CM (WIRE) ×2 IMPLANT

## 2014-11-26 NOTE — H&P (View-Only) (Signed)
Patient Name: [name]. Date of Encounter: 11/26/2014     Active Problems:   Hypertensive urgency   Tachycardia   Chest pain    SUBJECTIVE  Awaiting cath. No further CP.   CURRENT MEDS . budesonide  0.25 mg Nebulization QHS  . calcium-vitamin D  1 tablet Oral BID  . cholestyramine  4 g Oral Daily  . cloNIDine  0.3 mg Oral BID  . diltiazem  120 mg Oral BID  . hydrALAZINE  50 mg Oral TID  . ipratropium-albuterol  3 mL Nebulization QHS  . isosorbide mononitrate  60 mg Oral Daily  . latanoprost  1 drop Both Eyes QHS  . LORazepam  0.5 mg Oral TID  . losartan  100 mg Oral QPM  . metoprolol tartrate  25 mg Oral TID  . mometasone-formoterol  2 puff Inhalation BID  . montelukast  10 mg Oral QHS  . pantoprazole  40 mg Oral BID  . potassium chloride SA  20 mEq Oral BID  . pravastatin  80 mg Oral QPM  . sodium chloride  3 mL Intravenous Q12H  . sodium chloride  3 mL Intravenous Q12H  . torsemide  20 mg Oral Daily  . vitamin B-12  1,000 mcg Oral Daily    OBJECTIVE  Filed Vitals:   11/25/14 1945 11/25/14 2140 11/26/14 0520 11/26/14 0655  BP:  173/71 88/40 152/56  Pulse:   77 75  Temp:  97.1 F (36.2 C) 98.4 F (36.9 C)   TempSrc:  Oral Oral   Resp:  20 20   Height:      Weight:   143 lb 1.3 oz (64.9 kg)   SpO2: 99% 99% 97%     Intake/Output Summary (Last 24 hours) at 11/26/14 0835 Last data filed at 11/26/14 0658  Gross per 24 hour  Intake 859.54 ml  Output    925 ml  Net -65.46 ml   Filed Weights   11/24/14 2110 11/25/14 0300 11/26/14 0520  Weight: 147 lb (66.679 kg) 143 lb 1.3 oz (64.9 kg) 143 lb 1.3 oz (64.9 kg)    PHYSICAL EXAM  General: Pleasant, NAD. Neuro: Alert and oriented X 3. Moves all extremities spontaneously. Psych: Normal affect. HEENT:  Normal  Neck: Supple without bruits or JVD. Lungs:  Resp regular and unlabored, CTA. Heart: RRR no s3, s4, S1/S2 SEM murmur Abdomen: Soft, non-tender, non-distended, BS + x 4.  Extremities: No  clubbing, cyanosis or edema. DP/PT/Radials 2+ and equal bilaterally.  Accessory Clinical Findings  CBC  Recent Labs  11/24/14 2125  WBC 10.1  HGB 15.8  HCT 47.2  MCV 88.9  PLT 026   Basic Metabolic Panel  Recent Labs  11/24/14 2125  NA 139  K 3.7  CL 103  CO2 25  GLUCOSE 128*  BUN 13  CREATININE 1.05  CALCIUM 9.5   Liver Function Tests No results for input(s): AST, ALT, ALKPHOS, BILITOT, PROT, ALBUMIN in the last 72 hours. No results for input(s): LIPASE, AMYLASE in the last 72 hours. Cardiac Enzymes  Recent Labs  11/25/14 0625 11/25/14 1145 11/25/14 1911  TROPONINI 0.26* 0.19* 0.11*   BNP Invalid input(s): POCBNP D-Dimer  Recent Labs  11/24/14 2323  DDIMER 0.54*    TELE  NSR with one missed beat.   Radiology/Studies  Dg Chest 1 View  11/24/2014   CLINICAL DATA:  Nipple markers.  EXAM: CHEST  1 VIEW  COMPARISON:  Chest x-ray from earlier the same day  FINDINGS:  Bilateral nodular densities correlate with nipple markers. No evidence of pulmonary nodule.  Mild interstitial coarsening without pneumonia or edema. Normal heart size and mediastinal contours post CABG.  Chronic widening of the left anterior fourth rib.  IMPRESSION: Bilateral nipple shadows.  No evidence of pulmonary nodule.   Electronically Signed   By: Monte Fantasia M.D.   On: 11/24/2014 23:50   Dg Chest 2 View  11/24/2014   CLINICAL DATA:  Heart palpitations with elevated blood pressure and heart rate  EXAM: CHEST  2 VIEW  COMPARISON:  10/09/2014  FINDINGS: Heart size upper normal status post CABG. Stable aortic calcification. Vascular pattern normal. Mild mid to lower lung zone interstitial change likely scarring, stable. New no pleural effusion. Hyperinflation consistent with COPD.  Pulmonary nodular opacity over the right lung base measures about 7 mm. This may represent nipple shadow.  IMPRESSION: Pulmonary nodule versus nipple shadow. Suggest repeating the PA view with nipple markers.   Chronic interstitial change hyperinflation and aortic atherosclerosis with no other acute findings.   Electronically Signed   By: Skipper Cliche M.D.   On: 11/24/2014 21:58   Ct Angio Chest Pe W/cm &/or Wo Cm  11/25/2014   CLINICAL DATA:  Chest tightness and vomiting.  EXAM: CT ANGIOGRAPHY CHEST WITH CONTRAST  TECHNIQUE: Multidetector CT imaging of the chest was performed using the standard protocol during bolus administration of intravenous contrast. Multiplanar CT image reconstructions and MIPs were obtained to evaluate the vascular anatomy.  CONTRAST:  143mL OMNIPAQUE IOHEXOL 350 MG/ML SOLN  COMPARISON:  12/09/2013  FINDINGS: The extreme apices are excluded from view but this does not affect pulmonary artery evaluation.  THORACIC INLET/BODY WALL:  No acute abnormality.  MEDIASTINUM:  Mild cardiomegaly. No pericardial effusion. Extensive atherosclerosis, status post CABG. Graft evaluation limited due to faint systemic arterial opacification on this PE study. No aortic aneurysm or dissection. There is mild to moderate aortic valve calcifications/ sclerosis.  No pulmonary artery filling defect.  Small sliding hiatal hernia.  LUNG WINDOWS:  No consolidation.  No effusion.  Mild centrilobular emphysema.  No suspicious pulmonary nodule.  UPPER ABDOMEN:  No acute findings.  Scatter low-density liver lesions consistent with cysts.  OSSEOUS:  No acute fracture.  No suspicious lytic or blastic lesions.  Diffuse spondylosis.  Review of the MIP images confirms the above findings.  IMPRESSION: 1. No evidence of pulmonary embolism or other acute disease. 2. Emphysema and advanced atherosclerosis.   Electronically Signed   By: Monte Fantasia M.D.   On: 11/25/2014 02:11   Dg Bone Density  11/08/2014   Date of study: 11/03/14 Exam: DUAL X-RAY ABSORPTIOMETRY (DXA) FOR BONE MINERAL DENSITY (BMD) Instrument: [employer] Requesting Provider: PCP Indication: follow up for low BMD Comparison: none (please note that it is  not possible to compare data from  different instruments) Clinical data: Pt is a postmenopausal 81 y.o. male with previous h/o  fracture. On calcium and vitamin D.  Results:  Lumbar spine (L1-L4) Femoral neck (FN) 33% distal radius  T-score -1.1 RFN:-2.3 LFN:-1.9 n/a  Change in BMD from previous DXA test (%) n/a n/a n/a  (*) statistically significant  Assessment: the BMD is low according to the [facility] classification for  osteoporosis (see below). Fracture risk: moderate FRAX score: not supplied  Comments: the technical quality of the study is good. Evaluation for secondary causes should be considered if clinically  indicated.  Recommend optimizing calcium (1200 mg/day) and vitamin D (800 IU/day)  intake.  Followup:  Repeat BMD is appropriate after 2 years or after 1-2 years if  starting treatment.  WHO criteria for diagnosis of osteoporosis in postmenopausal women and in  men 41 y/o or older:  - normal: T-score -1.0 to + 1.0 - osteopenia/low bone density: T-score between -2.5 and -1.0 - osteoporosis: T-score below -2.5 - severe osteoporosis: T-score below -2.5 with history of fragility  fracture Note: although not part of the WHO classification, the presence of a  fragility fracture, regardless of the T-score, should be considered  diagnostic of osteoporosis, provided other causes for the fracture have  been excluded.  Treatment: The National Osteoporosis Foundation recommends that treatment  be considered in postmenopausal women and men age 50 or older with: 1. Hip or vertebral (clinical or morphometric) fracture 2. T-score of - 2.5 or lower at the spine or hip 3. 10-year fracture probability by FRAX of at least 20% for a major  osteoporotic fracture and 3% for a hip fracture  Loura Pardon MD        ASSESSMENT AND PLAN  Christop Hippert. is an 81 y.o. male with PMH of CAD s/p CABG '08, PCI 12, paroxysmal atrial fibrillation on apixiban who presented to [facility] ED on 11/25/14 with headache, chest tightness and nausea  that improve with resting and SL while working in the yard.   Chest Pain:  -- Troponin slightly elevated and trending downwards. 0.15--> 0.26--> 0.19--> 0.11.  -- He has hx of CABG with PCI SVG 2012. Given distant CABG and history of failed graft with stenting favor repeat cath. This is planned for this afternoon. Eliquis held  HTN: Labile. Continue current meds.   Tachycardia- improved with bystolic changed to metoprolol yesterday  Anxiety: On lorazepam consider adding SSRI per primary service  Chol: On statin  PAF: NSR. eliquis on hold for cath  Judy Pimple PA-C  Pager [phone]  No pain for cath today.  Eliquis held since yesterday Maint NSR  History of distant CABG with stenting of SVG Chest pain with elevated troponin.  May be able to be done from left radial.    Jenkins Rouge

## 2014-11-26 NOTE — Progress Notes (Signed)
In addition to recommendations from cardiac catheterization note, I would recommend peripheral vascular consultation by either Dr. Gwenlyn Found or Dr. Fletcher Anon for evaluation of left subclavian artery stenosis in this patient with a patent LIMA graft. Will arrange.  Sherren Mocha 11/26/2014 3:57 PM

## 2014-11-26 NOTE — Progress Notes (Signed)
Patient Name: [name]. Date of Encounter: 11/26/2014     Active Problems:   Hypertensive urgency   Tachycardia   Chest pain    SUBJECTIVE  Awaiting cath. No further CP.   CURRENT MEDS . budesonide  0.25 mg Nebulization QHS  . calcium-vitamin D  1 tablet Oral BID  . cholestyramine  4 g Oral Daily  . cloNIDine  0.3 mg Oral BID  . diltiazem  120 mg Oral BID  . hydrALAZINE  50 mg Oral TID  . ipratropium-albuterol  3 mL Nebulization QHS  . isosorbide mononitrate  60 mg Oral Daily  . latanoprost  1 drop Both Eyes QHS  . LORazepam  0.5 mg Oral TID  . losartan  100 mg Oral QPM  . metoprolol tartrate  25 mg Oral TID  . mometasone-formoterol  2 puff Inhalation BID  . montelukast  10 mg Oral QHS  . pantoprazole  40 mg Oral BID  . potassium chloride SA  20 mEq Oral BID  . pravastatin  80 mg Oral QPM  . sodium chloride  3 mL Intravenous Q12H  . sodium chloride  3 mL Intravenous Q12H  . torsemide  20 mg Oral Daily  . vitamin B-12  1,000 mcg Oral Daily    OBJECTIVE  Filed Vitals:   11/25/14 1945 11/25/14 2140 11/26/14 0520 11/26/14 0655  BP:  173/71 88/40 152/56  Pulse:   77 75  Temp:  97.1 F (36.2 C) 98.4 F (36.9 C)   TempSrc:  Oral Oral   Resp:  20 20   Height:      Weight:   143 lb 1.3 oz (64.9 kg)   SpO2: 99% 99% 97%     Intake/Output Summary (Last 24 hours) at 11/26/14 0835 Last data filed at 11/26/14 0658  Gross per 24 hour  Intake 859.54 ml  Output    925 ml  Net -65.46 ml   Filed Weights   11/24/14 2110 11/25/14 0300 11/26/14 0520  Weight: 147 lb (66.679 kg) 143 lb 1.3 oz (64.9 kg) 143 lb 1.3 oz (64.9 kg)    PHYSICAL EXAM  General: Pleasant, NAD. Neuro: Alert and oriented X 3. Moves all extremities spontaneously. Psych: Normal affect. HEENT:  Normal  Neck: Supple without bruits or JVD. Lungs:  Resp regular and unlabored, CTA. Heart: RRR no s3, s4, S1/S2 SEM murmur Abdomen: Soft, non-tender, non-distended, BS + x 4.  Extremities: No  clubbing, cyanosis or edema. DP/PT/Radials 2+ and equal bilaterally.  Accessory Clinical Findings  CBC  Recent Labs  11/24/14 2125  WBC 10.1  HGB 15.8  HCT 47.2  MCV 88.9  PLT 741   Basic Metabolic Panel  Recent Labs  11/24/14 2125  NA 139  K 3.7  CL 103  CO2 25  GLUCOSE 128*  BUN 13  CREATININE 1.05  CALCIUM 9.5   Liver Function Tests No results for input(s): AST, ALT, ALKPHOS, BILITOT, PROT, ALBUMIN in the last 72 hours. No results for input(s): LIPASE, AMYLASE in the last 72 hours. Cardiac Enzymes  Recent Labs  11/25/14 0625 11/25/14 1145 11/25/14 1911  TROPONINI 0.26* 0.19* 0.11*   BNP Invalid input(s): POCBNP D-Dimer  Recent Labs  11/24/14 2323  DDIMER 0.54*    TELE  NSR with one missed beat.   Radiology/Studies  Dg Chest 1 View  11/24/2014   CLINICAL DATA:  Nipple markers.  EXAM: CHEST  1 VIEW  COMPARISON:  Chest x-ray from earlier the same day  FINDINGS:  Bilateral nodular densities correlate with nipple markers. No evidence of pulmonary nodule.  Mild interstitial coarsening without pneumonia or edema. Normal heart size and mediastinal contours post CABG.  Chronic widening of the left anterior fourth rib.  IMPRESSION: Bilateral nipple shadows.  No evidence of pulmonary nodule.   Electronically Signed   By: Monte Fantasia M.D.   On: 11/24/2014 23:50   Dg Chest 2 View  11/24/2014   CLINICAL DATA:  Heart palpitations with elevated blood pressure and heart rate  EXAM: CHEST  2 VIEW  COMPARISON:  10/09/2014  FINDINGS: Heart size upper normal status post CABG. Stable aortic calcification. Vascular pattern normal. Mild mid to lower lung zone interstitial change likely scarring, stable. New no pleural effusion. Hyperinflation consistent with COPD.  Pulmonary nodular opacity over the right lung base measures about 7 mm. This may represent nipple shadow.  IMPRESSION: Pulmonary nodule versus nipple shadow. Suggest repeating the PA view with nipple markers.   Chronic interstitial change hyperinflation and aortic atherosclerosis with no other acute findings.   Electronically Signed   By: Skipper Cliche M.D.   On: 11/24/2014 21:58   Ct Angio Chest Pe W/cm &/or Wo Cm  11/25/2014   CLINICAL DATA:  Chest tightness and vomiting.  EXAM: CT ANGIOGRAPHY CHEST WITH CONTRAST  TECHNIQUE: Multidetector CT imaging of the chest was performed using the standard protocol during bolus administration of intravenous contrast. Multiplanar CT image reconstructions and MIPs were obtained to evaluate the vascular anatomy.  CONTRAST:  126mL OMNIPAQUE IOHEXOL 350 MG/ML SOLN  COMPARISON:  12/09/2013  FINDINGS: The extreme apices are excluded from view but this does not affect pulmonary artery evaluation.  THORACIC INLET/BODY WALL:  No acute abnormality.  MEDIASTINUM:  Mild cardiomegaly. No pericardial effusion. Extensive atherosclerosis, status post CABG. Graft evaluation limited due to faint systemic arterial opacification on this PE study. No aortic aneurysm or dissection. There is mild to moderate aortic valve calcifications/ sclerosis.  No pulmonary artery filling defect.  Small sliding hiatal hernia.  LUNG WINDOWS:  No consolidation.  No effusion.  Mild centrilobular emphysema.  No suspicious pulmonary nodule.  UPPER ABDOMEN:  No acute findings.  Scatter low-density liver lesions consistent with cysts.  OSSEOUS:  No acute fracture.  No suspicious lytic or blastic lesions.  Diffuse spondylosis.  Review of the MIP images confirms the above findings.  IMPRESSION: 1. No evidence of pulmonary embolism or other acute disease. 2. Emphysema and advanced atherosclerosis.   Electronically Signed   By: Monte Fantasia M.D.   On: 11/25/2014 02:11   Dg Bone Density  11/08/2014   Date of study: 11/03/14 Exam: DUAL X-RAY ABSORPTIOMETRY (DXA) FOR BONE MINERAL DENSITY (BMD) Instrument: [employer] Requesting Provider: PCP Indication: follow up for low BMD Comparison: none (please note that it is  not possible to compare data from  different instruments) Clinical data: Pt is a postmenopausal 81 y.o. male with previous h/o  fracture. On calcium and vitamin D.  Results:  Lumbar spine (L1-L4) Femoral neck (FN) 33% distal radius  T-score -1.1 RFN:-2.3 LFN:-1.9 n/a  Change in BMD from previous DXA test (%) n/a n/a n/a  (*) statistically significant  Assessment: the BMD is low according to the [facility] classification for  osteoporosis (see below). Fracture risk: moderate FRAX score: not supplied  Comments: the technical quality of the study is good. Evaluation for secondary causes should be considered if clinically  indicated.  Recommend optimizing calcium (1200 mg/day) and vitamin D (800 IU/day)  intake.  Followup:  Repeat BMD is appropriate after 2 years or after 1-2 years if  starting treatment.  WHO criteria for diagnosis of osteoporosis in postmenopausal women and in  men 8 y/o or older:  - normal: T-score -1.0 to + 1.0 - osteopenia/low bone density: T-score between -2.5 and -1.0 - osteoporosis: T-score below -2.5 - severe osteoporosis: T-score below -2.5 with history of fragility  fracture Note: although not part of the WHO classification, the presence of a  fragility fracture, regardless of the T-score, should be considered  diagnostic of osteoporosis, provided other causes for the fracture have  been excluded.  Treatment: The National Osteoporosis Foundation recommends that treatment  be considered in postmenopausal women and men age 16 or older with: 1. Hip or vertebral (clinical or morphometric) fracture 2. T-score of - 2.5 or lower at the spine or hip 3. 10-year fracture probability by FRAX of at least 20% for a major  osteoporotic fracture and 3% for a hip fracture  Loura Pardon MD        ASSESSMENT AND PLAN  Hunner Garcon. is an 81 y.o. male with PMH of CAD s/p CABG '08, PCI 12, paroxysmal atrial fibrillation on apixiban who presented to [facility] ED on 11/25/14 with headache, chest tightness and nausea  that improve with resting and SL while working in the yard.   Chest Pain:  -- Troponin slightly elevated and trending downwards. 0.15--> 0.26--> 0.19--> 0.11.  -- He has hx of CABG with PCI SVG 2012. Given distant CABG and history of failed graft with stenting favor repeat cath. This is planned for this afternoon. Eliquis held  HTN: Labile. Continue current meds.   Tachycardia- improved with bystolic changed to metoprolol yesterday  Anxiety: On lorazepam consider adding SSRI per primary service  Chol: On statin  PAF: NSR. eliquis on hold for cath  Judy Pimple PA-C  Pager [phone]  No pain for cath today.  Eliquis held since yesterday Maint NSR  History of distant CABG with stenting of SVG Chest pain with elevated troponin.  May be able to be done from left radial.    Jenkins Rouge

## 2014-11-26 NOTE — Progress Notes (Signed)
UR Completed. Uyen Eichholz, RN, BSN.  [phone] 

## 2014-11-26 NOTE — Progress Notes (Signed)
Telemetry shows rare non-conducted p waves.

## 2014-11-26 NOTE — Interval H&P Note (Signed)
History and Physical Interval Note:  11/26/2014 12:08 PM  [name].  has presented today for surgery, with the diagnosis of cp  The various methods of treatment have been discussed with the patient and family. After consideration of risks, benefits and other options for treatment, the patient has consented to  Procedure(s): Left Heart Cath and Cors/Grafts Angiography (N/A) as a surgical intervention .  The patient's history has been reviewed, patient examined, no change in status, stable for surgery.  I have reviewed the patient's chart and labs.  Questions were answered to the patient's satisfaction.    Cath Lab Visit (complete for each Cath Lab visit)  Clinical Evaluation Leading to the Procedure:   ACS: Yes.    Non-ACS:    Anginal Classification: CCS IV  Anti-ischemic medical therapy: Maximal Therapy (2 or more classes of medications)  Non-Invasive Test Results: No non-invasive testing performed  Prior CABG: Previous CABG       Sherren Mocha

## 2014-11-26 NOTE — Progress Notes (Signed)
[facility] PROGRESS NOTE  Assessment/Plan: Hypertensive urgency Blood pressure significantly improved with current management.  Chest pain: With troponin slightly elevated with history of CABG and PCI in 2012, cardiology was consulted they recommended to hold Eliquis and a cardiac cath results of cardiac catheter pending at this time.  Tachycardia Improve with metoprolol.    DVT prophylaxis: On Eliquis  Consultants: Cardiology - appreciate consult  Code Status: Full code  Family Communication: Wife in the room   Disposition Plan: Admit to telemetry   Consultants:  Cardiology*  Procedures:  Cardiac cath  Antibiotics:  None  HPI/Subjective: No complains.  Objective: Filed Vitals:   11/26/14 0520 11/26/14 0655 11/26/14 1010 11/26/14 1210  BP: 88/40 152/56 136/66   Pulse: 77 75 77   Temp: 98.4 F (36.9 C)  97.7 F (36.5 C)   TempSrc: Oral  Oral   Resp: 20     Height:      Weight: 64.9 kg (143 lb 1.3 oz)     SpO2: 97%  98% 97%    Intake/Output Summary (Last 24 hours) at 11/26/14 1214 Last data filed at 11/26/14 0658  Gross per 24 hour  Intake 619.54 ml  Output    625 ml  Net  -5.46 ml   Filed Weights   11/24/14 2110 11/25/14 0300 11/26/14 0520  Weight: 66.679 kg (147 lb) 64.9 kg (143 lb 1.3 oz) 64.9 kg (143 lb 1.3 oz)    Exam:  General: Alert, awake, oriented x3, in no acute distress.  HEENT: No bruits, no goiter.  Heart: Regular rate and rhythm. Lungs: Good air movement, clear Abdomen: Soft, nontender, nondistended, positive bowel sounds.  Neuro: Grossly intact, nonfocal.   Data Reviewed: Basic Metabolic Panel:  Recent Labs Lab 11/24/14 2125  NA 139  K 3.7  CL 103  CO2 25  GLUCOSE 128*  BUN 13  CREATININE 1.05  CALCIUM 9.5   Liver Function Tests: No results for input(s): AST, ALT, ALKPHOS, BILITOT, PROT, ALBUMIN in the last 168 hours. No results for input(s): LIPASE, AMYLASE in the last 168 hours. No results for  input(s): AMMONIA in the last 168 hours. CBC:  Recent Labs Lab 11/24/14 2125  WBC 10.1  HGB 15.8  HCT 47.2  MCV 88.9  PLT 166   Cardiac Enzymes:  Recent Labs Lab 11/24/14 2125 11/25/14 0625 11/25/14 1145 11/25/14 1911  TROPONINI 0.15* 0.26* 0.19* 0.11*   BNP (last 3 results)  Recent Labs  09/17/14 2200 11/24/14 2125  BNP 249.5* 648.9*    ProBNP (last 3 results) No results for input(s): PROBNP in the last 8760 hours.  CBG: No results for input(s): GLUCAP in the last 168 hours.  No results found for this or any previous visit (from the past 240 hour(s)).   Studies: Dg Chest 1 View  11/24/2014   CLINICAL DATA:  Nipple markers.  EXAM: CHEST  1 VIEW  COMPARISON:  Chest x-ray from earlier the same day  FINDINGS: Bilateral nodular densities correlate with nipple markers. No evidence of pulmonary nodule.  Mild interstitial coarsening without pneumonia or edema. Normal heart size and mediastinal contours post CABG.  Chronic widening of the left anterior fourth rib.  IMPRESSION: Bilateral nipple shadows.  No evidence of pulmonary nodule.   Electronically Signed   By: Monte Fantasia M.D.   On: 11/24/2014 23:50   Dg Chest 2 View  11/24/2014   CLINICAL DATA:  Heart palpitations with elevated blood pressure and heart rate  EXAM: CHEST  2  VIEW  COMPARISON:  10/09/2014  FINDINGS: Heart size upper normal status post CABG. Stable aortic calcification. Vascular pattern normal. Mild mid to lower lung zone interstitial change likely scarring, stable. New no pleural effusion. Hyperinflation consistent with COPD.  Pulmonary nodular opacity over the right lung base measures about 7 mm. This may represent nipple shadow.  IMPRESSION: Pulmonary nodule versus nipple shadow. Suggest repeating the PA view with nipple markers.  Chronic interstitial change hyperinflation and aortic atherosclerosis with no other acute findings.   Electronically Signed   By: Skipper Cliche M.D.   On: 11/24/2014 21:58    Ct Angio Chest Pe W/cm &/or Wo Cm  11/25/2014   CLINICAL DATA:  Chest tightness and vomiting.  EXAM: CT ANGIOGRAPHY CHEST WITH CONTRAST  TECHNIQUE: Multidetector CT imaging of the chest was performed using the standard protocol during bolus administration of intravenous contrast. Multiplanar CT image reconstructions and MIPs were obtained to evaluate the vascular anatomy.  CONTRAST:  155mL OMNIPAQUE IOHEXOL 350 MG/ML SOLN  COMPARISON:  12/09/2013  FINDINGS: The extreme apices are excluded from view but this does not affect pulmonary artery evaluation.  THORACIC INLET/BODY WALL:  No acute abnormality.  MEDIASTINUM:  Mild cardiomegaly. No pericardial effusion. Extensive atherosclerosis, status post CABG. Graft evaluation limited due to faint systemic arterial opacification on this PE study. No aortic aneurysm or dissection. There is mild to moderate aortic valve calcifications/ sclerosis.  No pulmonary artery filling defect.  Small sliding hiatal hernia.  LUNG WINDOWS:  No consolidation.  No effusion.  Mild centrilobular emphysema.  No suspicious pulmonary nodule.  UPPER ABDOMEN:  No acute findings.  Scatter low-density liver lesions consistent with cysts.  OSSEOUS:  No acute fracture.  No suspicious lytic or blastic lesions.  Diffuse spondylosis.  Review of the MIP images confirms the above findings.  IMPRESSION: 1. No evidence of pulmonary embolism or other acute disease. 2. Emphysema and advanced atherosclerosis.   Electronically Signed   By: Monte Fantasia M.D.   On: 11/25/2014 02:11    Scheduled Meds: . budesonide  0.25 mg Nebulization QHS  . calcium-vitamin D  1 tablet Oral BID  . cholestyramine  4 g Oral Daily  . cloNIDine  0.3 mg Oral BID  . diltiazem  120 mg Oral BID  . hydrALAZINE  50 mg Oral TID  . ipratropium-albuterol  3 mL Nebulization QHS  . isosorbide mononitrate  60 mg Oral Daily  . latanoprost  1 drop Both Eyes QHS  . LORazepam  0.5 mg Oral TID  . losartan  100 mg Oral QPM  .  metoprolol tartrate  25 mg Oral TID  . mometasone-formoterol  2 puff Inhalation BID  . montelukast  10 mg Oral QHS  . pantoprazole  40 mg Oral BID  . potassium chloride SA  20 mEq Oral BID  . pravastatin  80 mg Oral QPM  . sodium chloride  3 mL Intravenous Q12H  . sodium chloride  3 mL Intravenous Q12H  . torsemide  20 mg Oral Daily  . vitamin B-12  1,000 mcg Oral Daily   Continuous Infusions: . sodium chloride    . sodium chloride 1 mL/kg/hr (11/26/14 0726)    Time Spent:15 min   Charlynne Cousins  [facility] Pager [phone]. If 7PM-7AM, please contact night-coverage at [email or web address], password [facility] 11/26/2014, 12:14 PM  LOS: 1 day

## 2014-11-27 DIAGNOSIS — I214 Non-ST elevation (NSTEMI) myocardial infarction: Principal | ICD-10-CM

## 2014-11-27 DIAGNOSIS — I480 Paroxysmal atrial fibrillation: Secondary | ICD-10-CM

## 2014-11-27 LAB — BASIC METABOLIC PANEL
Anion gap: 7 (ref 5–15)
BUN: 16 mg/dL (ref 6–20)
CHLORIDE: 110 mmol/L (ref 101–111)
CO2: 24 mmol/L (ref 22–32)
CREATININE: 1.03 mg/dL (ref 0.61–1.24)
Calcium: 8.6 mg/dL — ABNORMAL LOW (ref 8.9–10.3)
GFR calc non Af Amer: >60 mL/min (ref >60)
GLUCOSE: 114 mg/dL — AB (ref 65–99)
Potassium: 4.3 mmol/L (ref 3.5–5.1)
Sodium: 141 mmol/L (ref 135–145)

## 2014-11-27 LAB — CBC
HCT: 38.9 % — ABNORMAL LOW (ref 39.0–52.0)
HEMOGLOBIN: 12.8 g/dL — AB (ref 13.0–17.0)
MCH: 29.7 pg (ref 26.0–34.0)
MCHC: 32.9 g/dL (ref 30.0–36.0)
MCV: 90.3 fL (ref 78.0–100.0)
PLATELETS: 153 10*3/uL (ref 150–400)
RBC: 4.31 MIL/uL (ref 4.22–5.81)
RDW: 15.3 % (ref 11.5–15.5)
WBC: 9 10*3/uL (ref 4.0–10.5)

## 2014-11-27 MED ORDER — METOPROLOL TARTRATE 25 MG PO TABS: 25.0000 mg | ORAL_TABLET | Freq: Three times a day (TID) | ORAL | Status: DC

## 2014-11-27 MED ORDER — HYDRALAZINE HCL 50 MG PO TABS: 100.0000 mg | ORAL_TABLET | Freq: Three times a day (TID) | ORAL | Status: DC

## 2014-11-27 MED ORDER — APIXABAN 5 MG PO TABS
5.0000 mg | ORAL_TABLET | Freq: Two times a day (BID) | ORAL | Status: DC
  Administered 2014-11-27: 5 mg via ORAL
  Filled 2014-11-27: qty 1

## 2014-11-27 MED ORDER — CLOPIDOGREL BISULFATE 75 MG PO TABS: 75.0000 mg | ORAL_TABLET | Freq: Every day | ORAL | Status: DC

## 2014-11-27 MED FILL — Clopidogrel Bisulfate Tab 300 MG (Base Equiv): ORAL | Qty: 2 | Status: AC

## 2014-11-27 MED FILL — Heparin Sodium (Porcine) 2 Unit/ML in Sodium Chloride 0.9%: INTRAMUSCULAR | Qty: 1500 | Status: AC

## 2014-11-27 NOTE — Discharge Summary (Signed)
Physician Discharge Summary  [name]. ZDG:[id number] DOB: [birth year] DOA: 11/24/2014  PCP: Gwendolyn Grant, MD  Admit date: 11/24/2014 Discharge date: 11/27/2014  Time spent: 25 minutes  Recommendations for Outpatient Follow-up:  1. Follow up with cardiology in 2 weeks  Discharge Diagnoses:  Active Problems:   Hypertensive urgency   PAF (paroxysmal atrial fibrillation)   Tachycardia   Chest pain   NSTEMI (non-ST elevated myocardial infarction)   Discharge Condition: stable  Diet recommendation: heart healthy  Filed Weights   11/25/14 0300 11/26/14 0520 11/27/14 0333  Weight: 64.9 kg (143 lb 1.3 oz) 64.9 kg (143 lb 1.3 oz) 68.901 kg (151 lb 14.4 oz)    History of present illness:  81 y.o. male With a history of coronary artery disease status post CABG in 2008 with stenting in 2012, PAF on Eliquis, hypertension, dyslipidemia, GERD, asthma. Patient developed left chest tightness, nausea, headache yesterday evening with exertion. These symptoms improved with rest and sublingual nitroglycerin  Hospital Course:  Hypertensive urgency: He was restart on his BP medication. Blood pressure significantly improved with management. His hydralazine was increase to 100 mg.  NSTEMI: With troponin slightly elevated with history of CABG and PCI in 2012, cardiology was consulted they recommended to hold Eliquis and a cardiac cath with Successful PCI of the native RCA with a Synergy DES Placed on plavix for at least 3 months. Resumed Eliquis today as groin site is stable. Plan to continue plavix/eliquis and then transition plavix to ASA after 3 months  Tachycardia: Improve with metoprolol.    Procedures:  Cardiac cath  Consultations:  cardiology  Discharge Exam: Filed Vitals:   11/27/14 0803  BP: 182/60  Pulse: 83  Temp: 97.6 F (36.4 C)  Resp: 18    General: A&O x3 Cardiovascular: RRR Respiratory: good air movement CTA B/L  Discharge  Instructions   Discharge Instructions    Diet - low sodium heart healthy    Complete by:  As directed      Increase activity slowly    Complete by:  As directed           Current Discharge Medication List    START taking these medications   Details  clopidogrel (PLAVIX) 75 MG tablet Take 1 tablet (75 mg total) by mouth daily with breakfast. Qty: 30 tablet, Refills: 03    metoprolol tartrate (LOPRESSOR) 25 MG tablet Take 1 tablet (25 mg total) by mouth 3 (three) times daily. Qty: 90 tablet, Refills: 3      CONTINUE these medications which have NOT CHANGED   Details  albuterol (PROVENTIL HFA;VENTOLIN HFA) 108 (90 BASE) MCG/ACT inhaler Inhale 2 puffs into the lungs every 6 (six) hours as needed for wheezing or shortness of breath. Qty: 8 g, Refills: 5    apixaban (ELIQUIS) 5 MG TABS tablet Take 1 tablet (5 mg total) by mouth 2 (two) times daily. Qty: 60 tablet, Refills: 5    budesonide (PULMICORT) 0.25 MG/2ML nebulizer solution Take 0.25 mg by nebulization at bedtime. For asthma    calcium-vitamin D (OSCAL WITH D) 500-200 MG-UNIT per tablet Take 1 tablet by mouth 2 (two) times daily.    cholestyramine (QUESTRAN) 4 G packet take 1 packet by mouth twice a day if needed for LOOSE STOOLS Qty: 60 each, Refills: 2    cloNIDine (CATAPRES) 0.3 MG tablet Take 1 tablet (0.3 mg total) by mouth 2 (two) times daily. Qty: 60 tablet, Refills: 0    denosumab (PROLIA) 60 MG/ML SOLN injection  Inject 60 mg into the skin every 6 (six) months. Administer in upper arm, thigh, or abdomen Qty: 1 mL, Refills: 5   Associated Diagnoses: Osteoporosis    diltiazem (CARDIZEM CD) 120 MG 24 hr capsule Take 1 capsule (120 mg total) by mouth 2 (two) times daily. Qty: 180 capsule, Refills: 3    DULERA 100-5 MCG/ACT AERO inhale 2 puffs by mouth once daily (THEN RINSE MOUTH AFTER USE) Qty: 17.6 g, Refills: 5    hydrALAZINE (APRESOLINE) 50 MG tablet Take 1 tablet (50 mg total) by mouth 3 (three) times  daily. Or as directed Qty: 90 tablet, Refills: 5    ipratropium-albuterol (DUONEB) 0.5-2.5 (3) MG/3ML SOLN Take 3 mLs by nebulization at bedtime. Qty: 360 mL, Refills: 6    isosorbide mononitrate (IMDUR) 60 MG 24 hr tablet Take 1 tablet (60 mg total) by mouth daily. Qty: 90 tablet, Refills: 3    latanoprost (XALATAN) 0.005 % ophthalmic solution Place 1 drop into both eyes at bedtime.     LORazepam (ATIVAN) 0.5 MG tablet Take 1 tablet (0.5 mg total) by mouth 3 (three) times daily. Qty: 90 tablet, Refills: 5    losartan (COZAAR) 100 MG tablet Take 1 tablet (100 mg total) by mouth every evening. Qty: 90 tablet, Refills: 3    LYSINE PO Take 1 tablet by mouth daily at 12 noon.    montelukast (SINGULAIR) 10 MG tablet Take 1 tablet (10 mg total) by mouth at bedtime. Qty: 90 tablet, Refills: 3    nitroGLYCERIN (NITROSTAT) 0.4 MG SL tablet Place 1 tablet (0.4 mg total) under the tongue every 5 (five) minutes as needed for chest pain. Qty: 25 tablet, Refills: 0    pantoprazole (PROTONIX) 40 MG tablet take 1 tablet by mouth twice a day Qty: 60 tablet, Refills: 1    potassium chloride SA (K-DUR,KLOR-CON) 20 MEQ tablet Take 1 tablet (20 mEq total) by mouth 2 (two) times daily. Qty: 60 tablet, Refills: 6    pravastatin (PRAVACHOL) 80 MG tablet Take 1 tablet (80 mg total) by mouth every evening. Qty: 30 tablet, Refills: 2    Probiotic Product (PHILLIPS COLON HEALTH PO) Take 1 capsule by mouth every evening.     RA VITAMIN B-12 TR 1000 MCG TBCR take 1 tablet by mouth once daily Qty: 30 tablet, Refills: 11    torsemide (DEMADEX) 20 MG tablet Take 1 tablet (20 mg total) by mouth daily. Qty: 30 tablet, Refills: 0      STOP taking these medications     nebivolol (BYSTOLIC) 10 MG tablet        Allergies  Allergen Reactions  . Ace Inhibitors Other (See Comments)    Severe asthma (COPD)  . Other Other (See Comments)    Maple trees, allergy symptoms  . Codeine Nausea And Vomiting  .  Lasix [Furosemide] Itching   Follow-up Information    Follow up with Quay Burow, MD.   Specialties:  Cardiology, Radiology   Why:  The office will call you to make an appoinment., If you do not hear from them, please contact them.   Contact information:   [address] [place] [address] [phone]       Follow up with Minus Breeding, MD On 12/03/2014.   Specialty:  Cardiology   Why:  2:15pm    Contact information:   [address] [place] [address] [phone]       Follow up with Gwendolyn Grant, MD In 2 weeks.  Specialty:  Internal Medicine   Why:  hospital follow up   Contact information:   [address] [place] [address] [phone]        The results of significant diagnostics from this hospitalization (including imaging, microbiology, ancillary and laboratory) are listed below for reference.    Significant Diagnostic Studies: Dg Chest 1 View  11/24/2014   CLINICAL DATA:  Nipple markers.  EXAM: CHEST  1 VIEW  COMPARISON:  Chest x-ray from earlier the same day  FINDINGS: Bilateral nodular densities correlate with nipple markers. No evidence of pulmonary nodule.  Mild interstitial coarsening without pneumonia or edema. Normal heart size and mediastinal contours post CABG.  Chronic widening of the left anterior fourth rib.  IMPRESSION: Bilateral nipple shadows.  No evidence of pulmonary nodule.   Electronically Signed   By: Monte Fantasia M.D.   On: 11/24/2014 23:50   Dg Chest 2 View  11/24/2014   CLINICAL DATA:  Heart palpitations with elevated blood pressure and heart rate  EXAM: CHEST  2 VIEW  COMPARISON:  10/09/2014  FINDINGS: Heart size upper normal status post CABG. Stable aortic calcification. Vascular pattern normal. Mild mid to lower lung zone interstitial change likely scarring, stable. New no pleural effusion. Hyperinflation consistent with COPD.  Pulmonary nodular opacity over the right  lung base measures about 7 mm. This may represent nipple shadow.  IMPRESSION: Pulmonary nodule versus nipple shadow. Suggest repeating the PA view with nipple markers.  Chronic interstitial change hyperinflation and aortic atherosclerosis with no other acute findings.   Electronically Signed   By: Skipper Cliche M.D.   On: 11/24/2014 21:58   Ct Angio Chest Pe W/cm &/or Wo Cm  11/25/2014   CLINICAL DATA:  Chest tightness and vomiting.  EXAM: CT ANGIOGRAPHY CHEST WITH CONTRAST  TECHNIQUE: Multidetector CT imaging of the chest was performed using the standard protocol during bolus administration of intravenous contrast. Multiplanar CT image reconstructions and MIPs were obtained to evaluate the vascular anatomy.  CONTRAST:  18mL OMNIPAQUE IOHEXOL 350 MG/ML SOLN  COMPARISON:  12/09/2013  FINDINGS: The extreme apices are excluded from view but this does not affect pulmonary artery evaluation.  THORACIC INLET/BODY WALL:  No acute abnormality.  MEDIASTINUM:  Mild cardiomegaly. No pericardial effusion. Extensive atherosclerosis, status post CABG. Graft evaluation limited due to faint systemic arterial opacification on this PE study. No aortic aneurysm or dissection. There is mild to moderate aortic valve calcifications/ sclerosis.  No pulmonary artery filling defect.  Small sliding hiatal hernia.  LUNG WINDOWS:  No consolidation.  No effusion.  Mild centrilobular emphysema.  No suspicious pulmonary nodule.  UPPER ABDOMEN:  No acute findings.  Scatter low-density liver lesions consistent with cysts.  OSSEOUS:  No acute fracture.  No suspicious lytic or blastic lesions.  Diffuse spondylosis.  Review of the MIP images confirms the above findings.  IMPRESSION: 1. No evidence of pulmonary embolism or other acute disease. 2. Emphysema and advanced atherosclerosis.   Electronically Signed   By: Monte Fantasia M.D.   On: 11/25/2014 02:11   Dg Bone Density  11/08/2014   Date of study: 11/03/14 Exam: DUAL X-RAY ABSORPTIOMETRY  (DXA) FOR BONE MINERAL DENSITY (BMD) Instrument: [employer] Requesting Provider: PCP Indication: follow up for low BMD Comparison: none (please note that it is not possible to compare data from  different instruments) Clinical data: Pt is a postmenopausal 81 y.o. male with previous h/o  fracture. On calcium and vitamin  D.  Results:  Lumbar spine (L1-L4) Femoral neck (FN) 33% distal radius  T-score -1.1 RFN:-2.3 LFN:-1.9 n/a  Change in BMD from previous DXA test (%) n/a n/a n/a  (*) statistically significant  Assessment: the BMD is low according to the [facility] classification for  osteoporosis (see below). Fracture risk: moderate FRAX score: not supplied  Comments: the technical quality of the study is good. Evaluation for secondary causes should be considered if clinically  indicated.  Recommend optimizing calcium (1200 mg/day) and vitamin D (800 IU/day)  intake.  Followup: Repeat BMD is appropriate after 2 years or after 1-2 years if  starting treatment.  WHO criteria for diagnosis of osteoporosis in postmenopausal women and in  men 60 y/o or older:  - normal: T-score -1.0 to + 1.0 - osteopenia/low bone density: T-score between -2.5 and -1.0 - osteoporosis: T-score below -2.5 - severe osteoporosis: T-score below -2.5 with history of fragility  fracture Note: although not part of the WHO classification, the presence of a  fragility fracture, regardless of the T-score, should be considered  diagnostic of osteoporosis, provided other causes for the fracture have  been excluded.  Treatment: The National Osteoporosis Foundation recommends that treatment  be considered in postmenopausal women and men age 31 or older with: 1. Hip or vertebral (clinical or morphometric) fracture 2. T-score of - 2.5 or lower at the spine or hip 3. 10-year fracture probability by FRAX of at least 20% for a major  osteoporotic fracture and 3% for a hip fracture  Loura Pardon MD        Microbiology: No results found for this or any  previous visit (from the past 240 hour(s)).   Labs: Basic Metabolic Panel:  Recent Labs Lab 11/24/14 2125 11/27/14 1024  NA 139 141  K 3.7 4.3  CL 103 110  CO2 25 24  GLUCOSE 128* 114*  BUN 13 16  CREATININE 1.05 1.03  CALCIUM 9.5 8.6*   Liver Function Tests: No results for input(s): AST, ALT, ALKPHOS, BILITOT, PROT, ALBUMIN in the last 168 hours. No results for input(s): LIPASE, AMYLASE in the last 168 hours. No results for input(s): AMMONIA in the last 168 hours. CBC:  Recent Labs Lab 11/24/14 2125 11/27/14 1024  WBC 10.1 9.0  HGB 15.8 12.8*  HCT 47.2 38.9*  MCV 88.9 90.3  PLT 166 153   Cardiac Enzymes:  Recent Labs Lab 11/24/14 2125 11/25/14 0625 11/25/14 1145 11/25/14 1911  TROPONINI 0.15* 0.26* 0.19* 0.11*   BNP: BNP (last 3 results)  Recent Labs  09/17/14 2200 11/24/14 2125  BNP 249.5* 648.9*    ProBNP (last 3 results) No results for input(s): PROBNP in the last 8760 hours.  CBG: No results for input(s): GLUCAP in the last 168 hours.     Signed:  Charlynne Cousins  [facility] 11/27/2014, 1:16 PM

## 2014-11-27 NOTE — Research (Signed)
AVIATOR Informed Consent   Subject Name: [name].  Subject met inclusion and exclusion criteria.  The informed consent form, study requirements and expectations were reviewed with the subject and questions and concerns were addressed prior to the signing of the consent form.  The subject verbalized understanding of the trial requirements.  The subject agreed to participate in the AVIATOR trial and signed the informed consent.  The informed consent was obtained prior to performance of any protocol-specific procedures for the subject.  A copy of the signed informed consent was given to the subject and a copy was placed in the subject's medical record.  Oletta Cohn. 11/26/2014  4:50 PM

## 2014-11-27 NOTE — Progress Notes (Signed)
CARDIAC REHAB PHASE I   PRE:  Rate/Rhythm: 84 SR  BP:  Supine: 151/55 left arm  Sitting:   Standing:    SaO2:   MODE:  Ambulation: 500 ft   POST:  Rate/Rhythm: 92 SR  BP:  Supine: 190/52 right arm  Sitting:   Standing:    SaO2:  0825-0920 Wrote order for Phase 1 as order not received and pt with stent and NSTEMI. Pt walked 500 ft with steady gait. No CP. Tolerated well. Education completed with pt stressing importance of plavix. Pt follows a heart healthy diet and watches sodium. Stated he had not had BP med this morning and that was one reason BP elevated. He keeps close monitoring of it at home. Pt could recite correct use on NTG. Has done CRP 2 before and doing Silver Sneakers now. He is not interested in Phase 2 as he stated he likes to exercise on his own and his wife needs close supervision and he does not like to be away from her.  Gave pt walking instructions and MI restrictions.  Graylon Good, RN BSN  11/27/2014 9:16 AM

## 2014-11-27 NOTE — Progress Notes (Signed)
Patient Name: [name]. Date of Encounter: 11/27/2014     Active Problems:   PAF (paroxysmal atrial fibrillation)   Hypertensive urgency   Tachycardia   Chest pain   NSTEMI (non-ST elevated myocardial infarction)    SUBJECTIVE  Feeling great. Ready to go home.   CURRENT MEDS . budesonide  0.25 mg Nebulization QHS  . calcium-vitamin D  1 tablet Oral BID  . cholestyramine  4 g Oral Daily  . cloNIDine  0.3 mg Oral BID  . clopidogrel  75 mg Oral Q breakfast  . diltiazem  120 mg Oral BID  . hydrALAZINE  50 mg Oral TID  . ipratropium-albuterol  3 mL Nebulization QHS  . isosorbide mononitrate  60 mg Oral Daily  . latanoprost  1 drop Both Eyes QHS  . LORazepam  0.5 mg Oral TID  . losartan  100 mg Oral QPM  . metoprolol tartrate  25 mg Oral TID  . mometasone-formoterol  2 puff Inhalation BID  . montelukast  10 mg Oral QHS  . pantoprazole  40 mg Oral BID  . potassium chloride SA  20 mEq Oral BID  . pravastatin  80 mg Oral QPM  . sodium chloride  3 mL Intravenous Q12H  . torsemide  20 mg Oral Daily  . vitamin B-12  1,000 mcg Oral Daily    OBJECTIVE  Filed Vitals:   11/26/14 2050 11/27/14 0333 11/27/14 0700 11/27/14 0803  BP:  141/50 143/46 182/60  Pulse:  72 66 83  Temp:  97.4 F (36.3 C)  97.6 F (36.4 C)  TempSrc:  Oral  Oral  Resp:  20  18  Height:      Weight:  68.901 kg (151 lb 14.4 oz)    SpO2: 97% 96% 98% 98%    Intake/Output Summary (Last 24 hours) at 11/27/14 0841 Last data filed at 11/27/14 0802  Gross per 24 hour  Intake   1040 ml  Output    475 ml  Net    565 ml   Filed Weights   11/25/14 0300 11/26/14 0520 11/27/14 0333  Weight: 64.9 kg (143 lb 1.3 oz) 64.9 kg (143 lb 1.3 oz) 68.901 kg (151 lb 14.4 oz)    PHYSICAL EXAM  General: Pleasant, NAD. Neuro: Alert and oriented X 3. Moves all extremities spontaneously. Psych: Normal affect. HEENT:  Normal  Neck: Supple without bruits or JVD. Lungs:  Resp regular and unlabored,  CTA. Heart: RRR no s3, s4, S1/S2 SEM murmur Abdomen: Soft, non-tender, non-distended, BS + x 4.  Extremities: No clubbing, cyanosis or edema. DP/PT/Radials 2+ and equal bilaterally. Mild ecchymosis at groin site. No hematoma or bruit  Accessory Clinical Findings  CBC  Recent Labs  11/24/14 2125  WBC 10.1  HGB 15.8  HCT 47.2  MCV 88.9  PLT 010   Basic Metabolic Panel  Recent Labs  11/24/14 2125  NA 139  K 3.7  CL 103  CO2 25  GLUCOSE 128*  BUN 13  CREATININE 1.05  CALCIUM 9.5   Liver Function Tests No results for input(s): AST, ALT, ALKPHOS, BILITOT, PROT, ALBUMIN in the last 72 hours. No results for input(s): LIPASE, AMYLASE in the last 72 hours. Cardiac Enzymes  Recent Labs  11/25/14 0625 11/25/14 1145 11/25/14 1911  TROPONINI 0.26* 0.19* 0.11*   BNP Invalid input(s): POCBNP D-Dimer  Recent Labs  11/24/14 2323  DDIMER 0.54*    TELE  NSR with a few missed beats.   Radiology/Studies  Dg Chest 1 View  11/24/2014   CLINICAL DATA:  Nipple markers.  EXAM: CHEST  1 VIEW  COMPARISON:  Chest x-ray from earlier the same day  FINDINGS: Bilateral nodular densities correlate with nipple markers. No evidence of pulmonary nodule.  Mild interstitial coarsening without pneumonia or edema. Normal heart size and mediastinal contours post CABG.  Chronic widening of the left anterior fourth rib.  IMPRESSION: Bilateral nipple shadows.  No evidence of pulmonary nodule.   Electronically Signed   By: Monte Fantasia M.D.   On: 11/24/2014 23:50   Dg Chest 2 View  11/24/2014   CLINICAL DATA:  Heart palpitations with elevated blood pressure and heart rate  EXAM: CHEST  2 VIEW  COMPARISON:  10/09/2014  FINDINGS: Heart size upper normal status post CABG. Stable aortic calcification. Vascular pattern normal. Mild mid to lower lung zone interstitial change likely scarring, stable. New no pleural effusion. Hyperinflation consistent with COPD.  Pulmonary nodular opacity over the right  lung base measures about 7 mm. This may represent nipple shadow.  IMPRESSION: Pulmonary nodule versus nipple shadow. Suggest repeating the PA view with nipple markers.  Chronic interstitial change hyperinflation and aortic atherosclerosis with no other acute findings.   Electronically Signed   By: Skipper Cliche M.D.   On: 11/24/2014 21:58   Ct Angio Chest Pe W/cm &/or Wo Cm  11/25/2014   CLINICAL DATA:  Chest tightness and vomiting.  EXAM: CT ANGIOGRAPHY CHEST WITH CONTRAST  TECHNIQUE: Multidetector CT imaging of the chest was performed using the standard protocol during bolus administration of intravenous contrast. Multiplanar CT image reconstructions and MIPs were obtained to evaluate the vascular anatomy.  CONTRAST:  151mL OMNIPAQUE IOHEXOL 350 MG/ML SOLN  COMPARISON:  12/09/2013  FINDINGS: The extreme apices are excluded from view but this does not affect pulmonary artery evaluation.  THORACIC INLET/BODY WALL:  No acute abnormality.  MEDIASTINUM:  Mild cardiomegaly. No pericardial effusion. Extensive atherosclerosis, status post CABG. Graft evaluation limited due to faint systemic arterial opacification on this PE study. No aortic aneurysm or dissection. There is mild to moderate aortic valve calcifications/ sclerosis.  No pulmonary artery filling defect.  Small sliding hiatal hernia.  LUNG WINDOWS:  No consolidation.  No effusion.  Mild centrilobular emphysema.  No suspicious pulmonary nodule.  UPPER ABDOMEN:  No acute findings.  Scatter low-density liver lesions consistent with cysts.  OSSEOUS:  No acute fracture.  No suspicious lytic or blastic lesions.  Diffuse spondylosis.  Review of the MIP images confirms the above findings.  IMPRESSION: 1. No evidence of pulmonary embolism or other acute disease. 2. Emphysema and advanced atherosclerosis.   Electronically Signed   By: Monte Fantasia M.D.   On: 11/25/2014 02:11   Dg Bone Density  11/08/2014   Date of study: 11/03/14 Exam: DUAL X-RAY ABSORPTIOMETRY  (DXA) FOR BONE MINERAL DENSITY (BMD) Instrument: [employer] Requesting Provider: PCP Indication: follow up for low BMD Comparison: none (please note that it is not possible to compare data from  different instruments) Clinical data: Pt is a postmenopausal 81 y.o. male with previous h/o  fracture. On calcium and vitamin D.  Results:  Lumbar spine (L1-L4) Femoral neck (FN) 33% distal radius  T-score -1.1 RFN:-2.3 LFN:-1.9 n/a  Change in BMD from previous DXA test (%) n/a n/a n/a  (*) statistically significant  Assessment: the BMD is low according to the [facility] classification for  osteoporosis (see below). Fracture risk: moderate FRAX score: not supplied  Comments: the technical quality  of the study is good. Evaluation for secondary causes should be considered if clinically  indicated.  Recommend optimizing calcium (1200 mg/day) and vitamin D (800 IU/day)  intake.  Followup: Repeat BMD is appropriate after 2 years or after 1-2 years if  starting treatment.  WHO criteria for diagnosis of osteoporosis in postmenopausal women and in  men 65 y/o or older:  - normal: T-score -1.0 to + 1.0 - osteopenia/low bone density: T-score between -2.5 and -1.0 - osteoporosis: T-score below -2.5 - severe osteoporosis: T-score below -2.5 with history of fragility  fracture Note: although not part of the WHO classification, the presence of a  fragility fracture, regardless of the T-score, should be considered  diagnostic of osteoporosis, provided other causes for the fracture have  been excluded.  Treatment: The National Osteoporosis Foundation recommends that treatment  be considered in postmenopausal women and men age 63 or older with: 1. Hip or vertebral (clinical or morphometric) fracture 2. T-score of - 2.5 or lower at the spine or hip 3. 10-year fracture probability by FRAX of at least 20% for a major  osteoporotic fracture and 3% for a hip fracture  Loura Pardon MD       Conclusion    1. SVG . 2. Prox Graft lesion, 99%  stenosed. The lesion was previously treated with a stent (unknown type) . 3. Ost RPDA lesion, 50% stenosed. 4. Ost LM to LM lesion, 70% stenosed. 5. LIMA . 6. The LIMA is patent. However, the left subclavian proximal to the LIMA and vertebral is severely stenosed with 75% calcific stenosis. There is retrograde flow visualized in the vertebral and the LIMA flow does not reach the heart 7. SVG . 8. Prox Graft lesion, 100% stenosed. 9. On the native RCA injection, there is antegrade flow into the PLA and retrograde flow seen back into the SVG 10. Prox LAD lesion, 50% stenosed. 11. Mid LAD lesion, 70% stenosed. 12. SVG . 13. Widely patent 14. Mid RCA lesion, 80% stenosed. There is a 0% residual stenosis post intervention. 15. A drug-eluting stent was placed. 16. There is hyperdynamic left ventricular systolic function.  FINAL CONCLUSIONS:  Severe multivessel CAD with 70% left main, 70% LAD, and 80% RCA stenosis  Preserved (normal) LV systolic function with LVEF 70-75%  S/P CABG with continued patency of the LIMA-LAD, SVG-OM  Subtotal occlusion of the SVG-PLA and chronic occlusion of the SVG-diagonal  Successful PCI of the native RCA with a Synergy DES RECOMMENDATIONS: Plavix 75 mg daily at least 3 months Resume Eliquis tomorrow as long as groin site stable Probably would avoid ASA while on plavix/Eliquis, but transition plavix to ASA at 3 months    ASSESSMENT AND PLAN  [name]. is an 81 y.o. male with PMH of CAD s/p CABG '08, PCI 12, paroxysmal atrial fibrillation on apixiban who presented to [facility] ED on 11/25/14 with headache, chest tightness and nausea that improved with resting and SL while working in the yard.   NSTEMI:  -- Troponin slightly elevated and trending downwards. 0.15--> 0.26--> 0.19--> 0.11.  -- He has hx of CABG with PCI SVG 2012.  -- s/p LHC 11/27/14 which revealed   Severe multivessel CAD with 70% left main, 70% LAD, and 80% RCA stenosis  Preserved  (normal) LV systolic function with LVEF 70-75%  S/P CABG with continued patency of the LIMA-LAD, SVG-OM  Subtotal occlusion of the SVG-PLA and chronic occlusion of the SVG-diagonal  Successful PCI of the native RCA with a  Synergy DES -- Placed on plavix for at least 3 months. Resumed Eliquis today as groin site is stable. Plan to continue plavix/eliquis and then transition plavix to ASA after 3 months -- Continue BB and statin. -- He has a previously scheduled outpatient appointment with Dr. Percival Spanish on 8/25 which he will keep. Likely okay for discharge today. Dr Johnsie Cancel to see.   Possible subclavian stenosis- patient denies any dizziness with UE movement. Outpatient follow up is being arranged with Dr Gwenlyn Found for further investigation  HTN: Labile. Continue current meds.  -- BP high this AM, but he has not received his AM meds yet. Continue to monitor   Tachycardia- improved with bystolic changed to metoprolol yesterday  Anxiety: On lorazepam consider   Chol: On statin  PAF: NSR. Resume Eliquis  Signed, Eileen Stanford PA-C  Pager [phone]  Pain free  Post DES to RCA.  R groin with no hematoma.  Continue current meds D/C home today will arrange outpatient f/u Dr Gwenlyn Found   Resume eliquis tomorrow  Jenkins Rouge

## 2014-12-03 ENCOUNTER — Ambulatory Visit (INDEPENDENT_AMBULATORY_CARE_PROVIDER_SITE_OTHER): Payer: Medicare Other | Admitting: Cardiology

## 2014-12-03 ENCOUNTER — Encounter: Payer: Self-pay | Admitting: Cardiology

## 2014-12-03 ENCOUNTER — Encounter: Payer: Self-pay | Admitting: Internal Medicine

## 2014-12-03 VITALS — BP 132/56 | HR 67 | Ht 62.0 in | Wt 149.3 lb

## 2014-12-03 DIAGNOSIS — I480 Paroxysmal atrial fibrillation: Secondary | ICD-10-CM

## 2014-12-03 DIAGNOSIS — I2510 Atherosclerotic heart disease of native coronary artery without angina pectoris: Secondary | ICD-10-CM | POA: Diagnosis not present

## 2014-12-03 NOTE — Patient Instructions (Signed)
Your physician wants you to follow-up in: 3 Months with Dr Percival Spanish and Next Available with Dr Gwenlyn Found.  You will receive a reminder letter in the mail two months in advance. If you don't receive a letter, please call our office to schedule the follow-up appointment.

## 2014-12-03 NOTE — Progress Notes (Signed)
HPI The patient presents for follow up of  CAD, s/p CABG, s/p Promus DES to S-PDA in 06/2010.  He was hospitalized again recently with hypertensive urgency and NSTEMI.  He had subtotal stenosis of the vein graft to posterior lateral off the right coronary. He had stenting of the native right coronary.  The patient also was found to have subclavian stenosis. I reviewed extensive hospital records. Since going home he's not had any further cardiovascular complaints. He denies chest pressure, neck or arm discomfort. He has had no new shortness of breath, PND or orthopnea. He's had no weight gain or edema.  Allergies  Allergen Reactions  . Ace Inhibitors Other (See Comments)    Severe asthma (COPD)  . Other Other (See Comments)    Maple trees, allergy symptoms  . Codeine Nausea And Vomiting  . Lasix [Furosemide] Itching    Current Outpatient Prescriptions  Medication Sig Dispense Refill  . albuterol (PROVENTIL HFA;VENTOLIN HFA) 108 (90 BASE) MCG/ACT inhaler Inhale 2 puffs into the lungs every 6 (six) hours as needed for wheezing or shortness of breath. 8 g 5  . apixaban (ELIQUIS) 5 MG TABS tablet Take 1 tablet (5 mg total) by mouth 2 (two) times daily. 60 tablet 5  . budesonide (PULMICORT) 0.25 MG/2ML nebulizer solution Take 0.25 mg by nebulization at bedtime. For asthma    . calcium-vitamin D (OSCAL WITH D) 500-200 MG-UNIT per tablet Take 1 tablet by mouth 2 (two) times daily.    . cholestyramine (QUESTRAN) 4 G packet take 1 packet by mouth twice a day if needed for LOOSE STOOLS (Patient taking differently: Take 1 packet by mouth daily) 60 each 2  . cloNIDine (CATAPRES) 0.3 MG tablet Take 1 tablet (0.3 mg total) by mouth 2 (two) times daily. 60 tablet 0  . clopidogrel (PLAVIX) 75 MG tablet Take 1 tablet (75 mg total) by mouth daily with breakfast. 30 tablet 03  . denosumab (PROLIA) 60 MG/ML SOLN injection Inject 60 mg into the skin every 6 (six) months. Administer in upper arm, thigh, or abdomen  1 mL 5  . diltiazem (CARDIZEM CD) 120 MG 24 hr capsule Take 1 capsule (120 mg total) by mouth 2 (two) times daily. 180 capsule 3  . DULERA 100-5 MCG/ACT AERO inhale 2 puffs by mouth once daily (THEN RINSE MOUTH AFTER USE) 17.6 g 5  . hydrALAZINE (APRESOLINE) 50 MG tablet Take 2 tablets (100 mg total) by mouth 3 (three) times daily. Or as directed (Patient taking differently: Take 100 mg by mouth 2 (two) times daily. Or as directed) 90 tablet 5  . ipratropium-albuterol (DUONEB) 0.5-2.5 (3) MG/3ML SOLN Take 3 mLs by nebulization at bedtime. 360 mL 6  . isosorbide mononitrate (IMDUR) 60 MG 24 hr tablet Take 1 tablet (60 mg total) by mouth daily. 90 tablet 3  . latanoprost (XALATAN) 0.005 % ophthalmic solution Place 1 drop into both eyes at bedtime.     Marland Kitchen LORazepam (ATIVAN) 0.5 MG tablet Take 1 tablet (0.5 mg total) by mouth 3 (three) times daily. 90 tablet 5  . losartan (COZAAR) 100 MG tablet Take 1 tablet (100 mg total) by mouth every evening. 90 tablet 3  . metoprolol tartrate (LOPRESSOR) 25 MG tablet Take 1 tablet (25 mg total) by mouth 3 (three) times daily. 90 tablet 3  . montelukast (SINGULAIR) 10 MG tablet Take 1 tablet (10 mg total) by mouth at bedtime. 90 tablet 3  . nitroGLYCERIN (NITROSTAT) 0.4 MG SL tablet Place 1 tablet (  0.4 mg total) under the tongue every 5 (five) minutes as needed for chest pain. 25 tablet 0  . NONFORMULARY OR COMPOUNDED ITEM Allergy Vaccine 1:10 Given at Home    . pantoprazole (PROTONIX) 40 MG tablet take 1 tablet by mouth twice a day 60 tablet 1  . potassium chloride SA (K-DUR,KLOR-CON) 20 MEQ tablet Take 1 tablet (20 mEq total) by mouth 2 (two) times daily. 60 tablet 6  . pravastatin (PRAVACHOL) 80 MG tablet Take 1 tablet (80 mg total) by mouth every evening. 30 tablet 2  . Probiotic Product (PHILLIPS COLON HEALTH PO) Take 1 capsule by mouth every evening.     Marland Kitchen RA VITAMIN B-12 TR 1000 MCG TBCR take 1 tablet by mouth once daily 30 tablet 11  . torsemide (DEMADEX)  20 MG tablet Take 1 tablet (20 mg total) by mouth daily. 30 tablet 0   No current facility-administered medications for this visit.    Past Medical History  Diagnosis Date  . CORONARY HEART DISEASE     a. s/p CABG;  b. cath 06/27/10: S-Dx occluded, S-PDA 80-90% (tx with PCI); S-OM ok, L-LAD ok;  EF 65-70%  c.  s/p Promus DES to S-PDA 06/2010;   d. Cath 8 2016 LIMA to the LAD patent, SVG to posterior lateral subtotal, SVG to diagonal occluded chronically. The patient had stenting of the native right vessel.  Marland Kitchen FIBRILLATION, ATRIAL   . SLEEP APNEA 09/2001    NPSG AHI 22/HR  . ASTHMA   . Depression   . GERD     with HH, hx esophageal stricture  . DYSLIPIDEMIA   . HYPERTENSION     Echo 3/12: EF 55-60%; mod LVH; mild AS/AI; LAE; PASP 38; mild pulmo HTN  . Personal history of alcoholism   . Diverticulosis   . Benign liver cyst   . Skin cancer     L forearm  . Cataract     surgery to both eyes  . Esophageal stricture   . Hiatal hernia   . Pneumonia 04/2013 hosp  . Anxiety   . Arthritis   . CHF (congestive heart failure)   . COPD (chronic obstructive pulmonary disease)   . Glaucoma   . Osteoporosis   . Subclavian arterial stenosis     Past Surgical History  Procedure Laterality Date  . Coronary artery bypass graft  02/2007  . Tonsillectomy    . Coronary angioplasty with stent placement  07/2010  . Cataract extraction, bilateral  2007  . Hernia repair  unsure ?60's  . Cholecystectomy  02/21/2011    Procedure: LAPAROSCOPIC CHOLECYSTECTOMY WITH INTRAOPERATIVE CHOLANGIOGRAM;  Surgeon: Pedro Earls, MD;  Location: [facility];  Service: General;  Laterality: N/A;  . Cardiac catheterization N/A 11/26/2014    Procedure: Left Heart Cath and Cors/Grafts Angiography;  Surgeon: Sherren Mocha, MD;  Location: [place] [facility];  Service: Cardiovascular;  Laterality: N/A;  . Cardiac catheterization N/A 11/26/2014    Procedure: Coronary Stent Intervention;  Surgeon: Sherren Mocha, MD;   Location: [place] [facility];  Service: Cardiovascular;  Laterality: N/A;    ROS:  As stated in the HPI and negative for all other systems.  PHYSICAL EXAM BP 132/56 mmHg  Pulse 67  Ht 5\' 2"  (1.575 m)  Wt 149 lb 4.8 oz (67.722 kg)  BMI 27.30 kg/m2 GENERAL:  Chronically ill appearing but in no acute distress HEENT:  Pupils equal round and reactive, fundi not visualized, oral mucosa unremarkable, dentures NECK:  No jugular  venous distention, waveform within normal limits, carotid upstroke brisk and symmetric, left carotid bruits, no thyromegaly LUNGS:  Clear to auscultation bilaterally CHEST:  Well healed sternotomy scar. HEART:  PMI not displaced or sustained,S1 and S2 within normal limits, no S3, no S4, no clicks, no rubs, soft apical, right upper sterna border early peaking murmur ABD:  Flat, positive bowel sounds normal in frequency in pitch, no bruits, no rebound, no guarding, no midline pulsatile mass, no hepatomegaly, no splenomegaly, healed abdominal scars. EXT:  2 plus pulses throughout, mild bilateral edema, no cyanosis no clubbing SKIN:  No nodules  EKG:  Sinus rhythm, rate 67, axis within normal limits, poor anterior R wave progression, no acute ST-T wave changes.  ASSESSMENT AND PLAN  CAD:  The patient has no new sypmtoms.  He will continue the Plavix for 3 months. I will likely stop this when I see him back and restart aspirin. Because of the subclavian stenosis which is compromising the LIMA I will set him up to see Dr. Gwenlyn Found  ATRIAL FIBRILLATION:    He has no Paroxysmal symptoms.  He will continue with Eliquis  HTN: His blood pressure is now at target.  CAROTID STENOSIS:  I reviewed the results from last year he had 40-59% bilateral stenosis.  I will defer further imaging to Dr. Gwenlyn Found.  AS:  This was moderate on echo earlier this year. We will follow this clinically.

## 2014-12-05 ENCOUNTER — Other Ambulatory Visit: Payer: Self-pay | Admitting: Cardiology

## 2014-12-10 ENCOUNTER — Other Ambulatory Visit: Payer: Self-pay | Admitting: Cardiology

## 2014-12-10 DIAGNOSIS — I6523 Occlusion and stenosis of bilateral carotid arteries: Secondary | ICD-10-CM

## 2014-12-16 ENCOUNTER — Other Ambulatory Visit: Payer: Self-pay | Admitting: Internal Medicine

## 2014-12-17 ENCOUNTER — Ambulatory Visit (HOSPITAL_COMMUNITY)
Admission: RE | Admit: 2014-12-17 | Discharge: 2014-12-17 | Disposition: A | Discharge status: Home / Self Care | Payer: Medicare Other | Source: Ambulatory Visit | Attending: Cardiovascular Disease | Admitting: Cardiovascular Disease

## 2014-12-17 DIAGNOSIS — E785 Hyperlipidemia, unspecified: Secondary | ICD-10-CM | POA: Insufficient documentation

## 2014-12-17 DIAGNOSIS — I10 Essential (primary) hypertension: Secondary | ICD-10-CM | POA: Insufficient documentation

## 2014-12-17 DIAGNOSIS — I6523 Occlusion and stenosis of bilateral carotid arteries: Secondary | ICD-10-CM | POA: Diagnosis not present

## 2014-12-17 DIAGNOSIS — F17200 Nicotine dependence, unspecified, uncomplicated: Secondary | ICD-10-CM | POA: Diagnosis not present

## 2014-12-17 DIAGNOSIS — I2510 Atherosclerotic heart disease of native coronary artery without angina pectoris: Secondary | ICD-10-CM | POA: Diagnosis not present

## 2014-12-23 ENCOUNTER — Ambulatory Visit (INDEPENDENT_AMBULATORY_CARE_PROVIDER_SITE_OTHER): Payer: Medicare Other | Admitting: Cardiovascular Disease

## 2014-12-23 ENCOUNTER — Encounter: Payer: Self-pay | Admitting: Cardiovascular Disease

## 2014-12-23 DIAGNOSIS — I6523 Occlusion and stenosis of bilateral carotid arteries: Secondary | ICD-10-CM | POA: Diagnosis not present

## 2014-12-23 DIAGNOSIS — R0989 Other specified symptoms and signs involving the circulatory and respiratory systems: Secondary | ICD-10-CM | POA: Diagnosis not present

## 2014-12-23 DIAGNOSIS — I771 Stricture of artery: Secondary | ICD-10-CM | POA: Insufficient documentation

## 2014-12-23 NOTE — Progress Notes (Signed)
12/23/2014 [place].   [birth year]  [id number]  Primary Physician Gwendolyn Grant, MD Primary Cardiologist: Lorretta Harp MD Renae Gloss   HPI:  [name] is a 81 year old ill-appearing married Caucasian male father of 2 children copy by his wife today. He is a patient of Dr. Percival Spanish and recently underwent cardiac catheterization by Dr. Burt Knack. He has a history of hypertension and hyperlipidemia. He has not had a stroke before. He has paroxysmal atrial fibrillation on oral anticoagulation.[name] is referred to me by Dr. Burt Knack for evaluation and potential treatment of left subclavian artery stenosis. He has a history of remote cornea artery bypass grafting by Dr. Prescott Gum  approximately 16 years ago.he also has a history of bilateral internal carotid artery stenosis. He recently had a non-STEMI and underwent cardiac catheterization by Dr. Burt Knack on 11/26/14 revealing a subtotally occluded PDA vein graft. Dr. Burt Knack intervened on the native right coronary artery. He did demonstrate a 75% proximal left subclavian artery stenosis with underfilling of his LIMA graft and with retrograde filling of the LIMA graft with left main injection. The patient does have a differential blood pressures upper extremities and duplex performed 12/17/14 revealed apparent flow in the left vertebral, high-grade left subclavian artery stenosis. He denies subclavian steel work upper extremity claudication although clearly hemodynamically this is a significant lesion proximal to the LIMA graft. He will benefit from left to prevent artery stenting. He wishes to delay this until early November which I'm okay with. He will stop his Eliquis 2 days prior to intervention.   Current Outpatient Prescriptions  Medication Sig Dispense Refill  . albuterol (PROVENTIL HFA;VENTOLIN HFA) 108 (90 BASE) MCG/ACT inhaler Inhale 2 puffs into the lungs every 6 (six) hours as needed for wheezing or shortness of breath. 8 g  5  . apixaban (ELIQUIS) 5 MG TABS tablet Take 1 tablet (5 mg total) by mouth 2 (two) times daily. 60 tablet 5  . budesonide (PULMICORT) 0.25 MG/2ML nebulizer solution Take 0.25 mg by nebulization at bedtime. For asthma    . calcium-vitamin D (OSCAL WITH D) 500-200 MG-UNIT per tablet Take 1 tablet by mouth 2 (two) times daily.    . cholestyramine (QUESTRAN) 4 G packet take 1 packet by mouth twice a day if needed for LOOSE STOOLS (Patient taking differently: Take 1 packet by mouth daily) 60 each 2  . cloNIDine (CATAPRES) 0.3 MG tablet Take 1 tablet (0.3 mg total) by mouth 2 (two) times daily. 60 tablet 0  . clopidogrel (PLAVIX) 75 MG tablet Take 1 tablet (75 mg total) by mouth daily with breakfast. 30 tablet 03  . denosumab (PROLIA) 60 MG/ML SOLN injection Inject 60 mg into the skin every 6 (six) months. Administer in upper arm, thigh, or abdomen 1 mL 5  . diltiazem (CARDIZEM CD) 120 MG 24 hr capsule Take 1 capsule (120 mg total) by mouth 2 (two) times daily. 180 capsule 3  . DULERA 100-5 MCG/ACT AERO inhale 2 puffs by mouth once daily (THEN RINSE MOUTH AFTER USE) 17.6 g 5  . FLUZONE HIGH-DOSE 0.5 ML SUSY inject 0.5 milliliter intramuscularly  0  . hydrALAZINE (APRESOLINE) 50 MG tablet Take 2 tablets (100 mg total) by mouth 3 (three) times daily. Or as directed (Patient taking differently: Take 100 mg by mouth 2 (two) times daily. Or as directed) 90 tablet 5  . ipratropium-albuterol (DUONEB) 0.5-2.5 (3) MG/3ML SOLN USE ONE VIAL VIA NEBULIZER EVERY 6 HOURS AS NEEDED FOR SHORTNESS OF  BREATH 360 mL 5  . isosorbide mononitrate (IMDUR) 60 MG 24 hr tablet Take 1 tablet (60 mg total) by mouth daily. 90 tablet 3  . latanoprost (XALATAN) 0.005 % ophthalmic solution Place 1 drop into both eyes at bedtime.     Marland Kitchen LORazepam (ATIVAN) 0.5 MG tablet Take 1 tablet (0.5 mg total) by mouth 3 (three) times daily. 90 tablet 5  . losartan (COZAAR) 100 MG tablet Take 1 tablet (100 mg total) by mouth every evening. 90 tablet  3  . metoprolol tartrate (LOPRESSOR) 25 MG tablet Take 1 tablet (25 mg total) by mouth 3 (three) times daily. 90 tablet 3  . montelukast (SINGULAIR) 10 MG tablet Take 1 tablet (10 mg total) by mouth at bedtime. 90 tablet 3  . nitroGLYCERIN (NITROSTAT) 0.4 MG SL tablet Place 1 tablet (0.4 mg total) under the tongue every 5 (five) minutes as needed for chest pain. 25 tablet 0  . NONFORMULARY OR COMPOUNDED ITEM Allergy Vaccine 1:10 Given at Home    . pantoprazole (PROTONIX) 40 MG tablet take 1 tablet by mouth twice a day 60 tablet 1  . potassium chloride SA (K-DUR,KLOR-CON) 20 MEQ tablet Take 1 tablet (20 mEq total) by mouth 2 (two) times daily. 60 tablet 6  . pravastatin (PRAVACHOL) 80 MG tablet take 1 tablet by mouth every morning 30 tablet 2  . Probiotic Product (PHILLIPS COLON HEALTH PO) Take 1 capsule by mouth every evening.     Marland Kitchen RA VITAMIN B-12 TR 1000 MCG TBCR take 1 tablet by mouth once daily 30 tablet 11  . torsemide (DEMADEX) 20 MG tablet Take 1 tablet (20 mg total) by mouth daily. 30 tablet 0   No current facility-administered medications for this visit.    Allergies  Allergen Reactions  . Ace Inhibitors Other (See Comments)    Severe asthma (COPD)  . Other Other (See Comments)    Maple trees, allergy symptoms  . Codeine Nausea And Vomiting  . Lasix [Furosemide] Itching    Social History   Social History  . Marital Status: Married    Spouse Name: N/A  . Number of Children: 2  . Years of Education: N/A   Occupational History  . retired     60 years   Social History Main Topics  . Smoking status: Former Smoker -- 3.00 packs/day for 40 years    Types: Cigarettes    Quit date: 04/11/1979  . Smokeless tobacco: Never Used  . Alcohol Use: No     Comment: quit drinking 40+ years  . Drug Use: No  . Sexual Activity: Yes   Other Topics Concern  . 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 Married lives with wife 2 children      Review of Systems: General: negative for chills, fever, night sweats or weight changes.  Cardiovascular: negative for chest pain, dyspnea on exertion, edema, orthopnea, palpitations, paroxysmal nocturnal dyspnea or shortness of breath Dermatological: negative for rash Respiratory: negative for cough or wheezing Urologic: negative for hematuria Abdominal: negative for nausea, vomiting, diarrhea, bright red blood per rectum, melena, or hematemesis Neurologic: negative for visual changes, syncope, or dizziness All other systems reviewed and are otherwise negative except as noted above.    Blood pressure 138/60, pulse 60, height 5\' 2"  (1.575 m), weight 154 lb (69.854 kg).  General appearance: alert and no distress Neck: no adenopathy, no JVD, supple, symmetrical, trachea midline, thyroid not enlarged, symmetric, no tenderness/mass/nodules and  bilateral carotid bruits and left subclavian bruit Lungs: clear to auscultation bilaterally Heart: 2/6 outflow tract murmur Extremities: extremities normal, atraumatic, no cyanosis or edema  EKG not performed today  ASSESSMENT AND PLAN:   Subclavian artery stenosis, left [name] is referred to me by Dr. Burt Knack for evaluation and potential treatment of left subclavian artery stenosis. He has a history of remote cornea artery bypass grafting by Dr. Prescott Gum  approximately 16 years ago.he also has a history of bilateral internal carotid artery stenosis. He recently had a non-STEMI and underwent cardiac catheterization by Dr. Burt Knack on 11/26/14 revealing a subtotally occluded PDA vein graft. Dr. Burt Knack intervened on the native right coronary artery. He did demonstrate a 75% proximal left subclavian artery stenosis with underfilling of his LIMA graft and with retrograde filling of the LIMA graft with left main injection. The patient does have a differential blood pressures upper extremities and duplex performed 12/17/14 revealed apparent flow in the left  vertebral, high-grade left subclavian artery stenosis. He denies subclavian steel work upper extremity claudication although clearly hemodynamically this is a significant lesion proximal to the LIMA graft. He will benefit from left to prevent artery stenting. He wishes to delay this until early November which I'm okay with. He will stop his Eliquis 2 days prior to intervention.      Lorretta Harp MD FACP,FACC,FAHA, [facility] 12/23/2014 3:02 PM

## 2014-12-23 NOTE — Patient Instructions (Addendum)
Medication Instructions:  Your physician recommends that you continue on your current medications as directed. Please refer to the Current Medication list given to you today.   Labwork: Your physician recommends that you return for lab work in: 7 days before your procedure (cbc, bmet, tsh, pt/inr, ptt) The lab can be found on the FIRST FLOOR of out building in [address]   Testing/Procedures: Dr. Gwenlyn Found has ordered a Left Subclavian angiogram to be done at [facility].  This procedure is going to look at the bloodflow in your lower extremities.  If Dr. Gwenlyn Found is able to open up the arteries, you will have to spend one night in the hospital.  If he is not able to open the arteries, you will be able to go home that same day.  SCHEDULE FOR THE FIRST WEEK OF November; HOLD ELIQUIS FOR 2 DAYS BEFORE SCHEDULED PROCEDURE  After the procedure, you will not be allowed to drive for 3 days or push, pull, or lift anything greater than 10 lbs for one week.    You will be required to have the following tests prior to the procedure:  1. Blood work-the blood work can be done no more than 7 days prior to the procedure.  It can be done at any [facility] lab.  There is one downstairs on the first floor of this building and one in the [facility] building [phone] [address])    *REPS: NONE  Puncture site Right Groin   Follow-Up: Your follow up will be setup up all after your procedure.  Any Other Special Instructions Will Be Listed Below (If Applicable).

## 2014-12-23 NOTE — Assessment & Plan Note (Signed)
Mr. Mundis is referred to me by Dr. Burt Knack for evaluation and potential treatment of left subclavian artery stenosis. He has a history of remote cornea artery bypass grafting by Dr. Prescott Gum  approximately 16 years ago.he also has a history of bilateral internal carotid artery stenosis. He recently had a non-STEMI and underwent cardiac catheterization by Dr. Burt Knack on 11/26/14 revealing a subtotally occluded PDA vein graft. Dr. Burt Knack intervened on the native right coronary artery. He did demonstrate a 75% proximal left subclavian artery stenosis with underfilling of his LIMA graft and with retrograde filling of the LIMA graft with left main injection. The patient does have a differential blood pressures upper extremities and duplex performed 12/17/14 revealed apparent flow in the left vertebral, high-grade left subclavian artery stenosis. He denies subclavian steel work upper extremity claudication although clearly hemodynamically this is a significant lesion proximal to the LIMA graft. He will benefit from left to prevent artery stenting. He wishes to delay this until early November which I'm okay with. He will stop his Eliquis 2 days prior to intervention.

## 2014-12-24 ENCOUNTER — Other Ambulatory Visit: Payer: Self-pay

## 2014-12-24 DIAGNOSIS — Z01812 Encounter for preprocedural laboratory examination: Secondary | ICD-10-CM

## 2014-12-24 DIAGNOSIS — I739 Peripheral vascular disease, unspecified: Secondary | ICD-10-CM

## 2014-12-24 DIAGNOSIS — Z01818 Encounter for other preprocedural examination: Secondary | ICD-10-CM

## 2014-12-25 ENCOUNTER — Telehealth: Payer: Self-pay | Admitting: Cardiology

## 2014-12-25 NOTE — Telephone Encounter (Signed)
Follow Up ° ° °Pt returning call from earlier. Please call back. °

## 2014-12-28 ENCOUNTER — Encounter: Payer: Self-pay | Admitting: *Deleted

## 2014-12-28 DIAGNOSIS — Z006 Encounter for examination for normal comparison and control in clinical research program: Secondary | ICD-10-CM

## 2014-12-28 NOTE — Progress Notes (Signed)
[facility] 30 day follow up phone call : No changes in medication at this point but patient states he is having "Stent in Novemeber". Next follow up  Phone call due 2/17

## 2015-01-01 ENCOUNTER — Other Ambulatory Visit: Payer: Self-pay | Admitting: Cardiology

## 2015-01-01 NOTE — Telephone Encounter (Signed)
REFILL 

## 2015-01-04 ENCOUNTER — Ambulatory Visit (INDEPENDENT_AMBULATORY_CARE_PROVIDER_SITE_OTHER): Payer: Medicare Other | Admitting: Internal Medicine

## 2015-01-04 ENCOUNTER — Encounter: Payer: Self-pay | Admitting: Internal Medicine

## 2015-01-04 VITALS — BP 136/58 | HR 59 | Ht 65.0 in | Wt 153.4 lb

## 2015-01-04 DIAGNOSIS — J309 Allergic rhinitis, unspecified: Secondary | ICD-10-CM

## 2015-01-04 DIAGNOSIS — G4733 Obstructive sleep apnea (adult) (pediatric): Secondary | ICD-10-CM | POA: Diagnosis not present

## 2015-01-04 DIAGNOSIS — J449 Chronic obstructive pulmonary disease, unspecified: Secondary | ICD-10-CM | POA: Diagnosis not present

## 2015-01-04 DIAGNOSIS — J302 Other seasonal allergic rhinitis: Secondary | ICD-10-CM

## 2015-01-04 DIAGNOSIS — J3089 Other allergic rhinitis: Secondary | ICD-10-CM

## 2015-01-04 NOTE — Progress Notes (Signed)
Patient ID: THAILAND DUBE, male    DOB: 1930-07-08, 81 y.o.   MRN: [id number]  HPI 11/16/10- 47 year old man followed for severe chronic asthma, sleep apnea, allergic rhinitis, complicated by HBP CAD history of atrial fibrillation, GERD Last here May 20, 2010 CPAP 10 all night every night- helps sleep. Asks refill Provigil to help with long drives.  Allergy vaccine GO definitely helps- he is convinced.  He notes no asthma and no prednisone since last November- credits frequent handwashing, avoiding people with colds and the allergy vaccine. No new concerns. Continues cardiac rehab.   03/03/11-  37 year old man followed for severe chronic asthma, sleep apnea, allergic rhinitis, complicated by HBP CAD history of atrial fibrillation, GERD. Wife here. He recently had his fourth hospital stay since March, for gallbladder pain, cholecystectomy, cardiac stent. Cholecystectomy was 2 weeks ago. Through these he had shortness of breath and cough which has persisted and keep him awake. His nose runs.  07/03/11- 33 year old man followed for severe chronic asthma, sleep apnea, allergic rhinitis, complicated by HBP, CAD, history of atrial fibrillation, GERD. Wife here. Since last here has been to urgent care in emergency room 4 times for exacerbations of asthma. On prednisone 10 mg/day since February from Dr [name]. Definitely having reflux events and we discussed how that may be the significant issue. Denies sinus infection. Had chest x-rays in January and February which she was told were negative. He asks to try increasing his CPAP pressure because he doesn't feel he is sleeping quite well enough. We discussed this.  07/18/11- 11 year old man followed for severe chronic asthma, sleep apnea, allergic rhinitis, complicated by HBP, CAD, history of atrial fibrillation, GERD. Wife here. Seen by Dr Daub-Increased SOB and wheezing; was put on prednisone to help keep patient out of hospital; still not feeling  much better. Has been on prednisone 60 mg daily for past 4 days but still significant shortness of breath with exertion and wheeze. No excess 2 chest x-rays have been clear and EKG "knot heart". Using home nebulizer with DuoNeb 4 times daily plus his rescue inhaler at least twice per day. Nothing seems infected. He has not recognized heartburn although that has been an issue in the past. Denies chest pain, fever, ankle edema or palpitation. Some nasal congestion without much sneezing. Has been indoors without much exposure to pollen.  08/08/11- 44 year old man followed for severe chronic asthma, sleep apnea, allergic rhinitis, complicated by HBP, CAD, history of atrial fibrillation, GERD. Wife here. Hospitalized April 11-17 with acute exacerbation of his chronic fixed asthma. Anxiety and reflux are thought to be important. He says he is "doing great" now. He feels an anxiety medication would help and we discussed this. Currently on prednisone 20 mg daily.  10/04/11-  22 year old man followed for severe chronic asthma, sleep apnea, allergic rhinitis, complicated by HBP, CAD, history of atrial fibrillation, GERD. Wife here  Pt states increase of sob,wheezing,fatigue since getting out of hospital . No hospitalizations since last here. He stays short of breath especially with exertional dyspnea but is comfortable sitting and when lying in bed. His wife points out he is able to do yard work using a [employer]. We reviewed Dr.Hochrein's cardiology note. Question, , each of his dyspnea is pulmonary versus cardiac. We were asked to draw labs at this visit. He was criticized for using his nebulizer machine too frequently before his hospitalization but has only used it a total of 7 times since then. He had remained on maintenance prednisone  since January. His wife gradually tapered it off so he has had none in the last 3 weeks. He continues, and wants to continue, allergy vaccine at 1:10, well tolerated. CXR 07/22/11-    reviewed with them: Findings:  Grossly unchanged enlarged cardiac silhouette and mediastinal  contours post median sternotomy and CABG. Atherosclerotic  calcification within the thoracic aorta. There is persistent mild  elevation of the right hemidiaphragm. Grossly unchanged bibasilar  heterogeneous opacities favored to represent atelectasis. Grossly  unchanged bones including mild compression deformity of a lower  thoracic vertebral body.  IMPRESSION:  No acute cardiopulmonary disease.  Original Report Authenticated By: Rachel Moulds, M.D.  Office Spirometry: FEV1 1.40/58%, FVC 2.44/78%, FEV1/FVC 0.57/73%, FEF 25-75% 0.56/23%.  01/08/12- 89 year old man followed for severe chronic asthma, sleep apnea, allergic rhinitis, complicated by HBP, CAD, history of atrial fibrillation, GERD. Wife here Needs refill for rescue inhaler as he is leaving for beach in am; breathing is doing pretty well; only one treatment since seeing [place] last. . Asks for an antibiotic to carry.  05/10/2012 Acute OV Complains of increased dyspnea , coughing and wheezing. This woke him up this morning and used his neb without relief then used his inhaler without any relief.  Dry cough and wheezing started last pm.  Patient denies any hemoptysis, orthopnea, PND, leg swelling, or calf pain. Patient reports that wheezes. Started 2-3 days ago and worsened. Last night. Prior to going to bed. He had started on prednisone earlier in the week. at 10 mg and is currently on 5 mg daily .  He had his rescue inhaler this am with some relief.  05/30/12- 36 year old man followed for severe chronic asthma, sleep apnea, allergic rhinitis, complicated by HBP, CAD, history of atrial fibrillation, GERD. Wife here He says he is a well now, off prednisone x2 weeks. Likes Mucinex DM. We discussed steroidsand I suggested bone density assessment. We discussed steroid sparing availability of Daliresp trial is frequency of bronchitic exacerbations.   He continues using CPAP 12/ [facility] with no concerns.  09/05/12- 5 year old man followed for severe chronic asthma, sleep apnea, allergic rhinitis, complicated by HBP, CAD, history of atrial fibrillation, GERD.     Wife here FOLLOWS FOR: had to go to UC on 08-26-12 due to breathing issues. Was given shot, neb tx, and pred taper. Had caught a cold while fishing, leading to exacerbation. Finish prednisone taper 2 days ago. Feels "great" now. Allergy vaccine 1:10 GO CPAP 12/[facility] with good compliance and control CXR 08/26/12 IMPRESSION:  No active disease.  Original Report Authenticated By: Vallery Ridge, M.D.  11/04/16 -20 year old man followed for severe chronic asthma, sleep apnea, allergic rhinitis, complicated by HBP, CAD, history of atrial fibrillation/ pacemaker , GERD.     Wife here( She has mitochondrial myopathy) FOLOWS FOR: reports has been having diff w wheezing denies any chest tightness-- states he has been  seen in [place] clinic every month for breathing and per that MD patient may need some medication readjustment   wheeze and shortness of breath, sometimes green mucus. Gets better with neb been steroid but then relapses each time. Uses home nebulizer 3 times daily but doesn't last. Blamed Advair for 2 episodes of pneumonia-discussed. They feel he is doing well with allergy vaccine 1: 10 GO. OSA- good compliance and control with CPAP 12/ [facility]..  03/12/13- 76 year old man followed for severe chronic asthma, sleep apnea, allergic rhinitis, complicated by HBP, CAD, history of atrial fibrillation/ pacemaker , GERD.     (Wife  has mitochondrial myopathy) FOLLOWS FOR: since being on Dulera 100 has not had any flare ups or had to use nebulizer machine. Recent hospital stay at [facility] for heart issues- AFib/ tachycardia.  Feels "nervous, chilly" but not sick. Little cough or wheeze. Has been able to cut way down on nebulizer and w/o need recent prednisone. Ankles swell. Note med list shows  both diltiazem and Norvasc.  07/29/13- 78 year old M former smoker(120 pk yr) followed for severe chronic asthma/ COPD, OSA/CPAP, allergic rhinitis, complicated by HBP, CAD, history of AFib/ pacemaker , GERD.     (Wife  has mitochondrial myopathy) Allergy vaccine 1:10 GO     CPAP 12/ [facility]    FOLLOWS FOR: has been in hospital 3 times since Dec 2014. Pt has had flare ups with COPD. Breathing now is at baseline. Going to gym for exercise twice weekly. He likes [employer] Would still like to keep a few Nuvigil tablets around for long drives- discussed. He asks about updating pneumonia vaccine. He is at very high risk for pneumonia and complications. We compared Pneumovax-23 with Prevnar 13 and guidelines. CXR 06/14/13 IMPRESSION:  Findings concerning for right perihilar and infrahilar pneumonia.  Resolution of the previously seen left pneumonia.  Electronically Signed  By: Rolm Baptise M.D.  On: 06/14/2013 13:51 Office spirometry 07/29/2013- moderate obstructive airways disease-FVC 2.50/81%, FEV1 1.62/68%, FEV1/FVC 0.65, FEF 25-75% 0.90/39%.  11/25/13- 14 year old M former smoker(120 pk yr) followed for severe chronic asthma/ COPD, OSA/CPAP, allergic rhinitis, complicated by HBP, CAD, history of AFib/ pacemaker , GERD.     (Wife  has mitochondrial myopathy) FOLLOWS WUJ:[id number] good since on Dulera (in [place]),no cough,no wheezing,sleeps well,CPAP works well Allergy vaccine 1:10 GO     CPAP 12/ [facility]    01/08/14-  48 year old M former smoker(120 pk yr) followed for severe chronic asthma/ COPD, OSA/CPAP, allergic rhinitis, complicated by HBP, CAD/ CABG, history of AFib/ pacemaker , GERD, recurrent aspiration pneumonia.      (Wife here-  has mitochondrial myopathy) Post Hospital-01/01/14 through 01/04/14 for [facility] PNA c/w aspiration Swallowing eval showed mild dysphagia- need chin tuck Ba swallow 01/08/14- confirmed reflux into lower 1/3 esophagus, esophageal stricture  Allergy vaccine 1:10 GO     CPAP  12/ [facility] CXR 01/01/14 IMPRESSION:  1. Persistent right lower lobe infiltrate.  2. Prior CABG. Mild cardiomegaly, no pulmonary venous congestion.  Electronically Signed  By: Marcello Moores Register  On: 01/01/2014 10:25  05/13/14- 87 year old M former smoker(120 pk yr) followed for severe chronic asthma/ COPD, OSA/CPAP, allergic rhinitis, complicated by HBP, CAD/ CABG, history of AFib/ pacemaker , GERD, recurrent aspiration pneumonia. FOLLOW FOR:  allergies; pt currently has shingles.  Wife here Breathing feels fairly well controlled now. No prednisone in a year GERD controlled better. Chin tuck helps swallow without cough.  09/11/14- 51 year old M former smoker(120 pk yr) followed for severe chronic asthma/ COPD, OSA/CPAP, allergic rhinitis, complicated by HBP, CAD/ CABG, history of AFib/ pacemaker , GERD, recurrent aspiration pneumonia. FOLLOWS FOR: Pt states he is doing well with breathing; has had issues several times with BP.  Wears CPAP 12/ [facility] every night and at nap time-[facility]) no dl recently. Continues allergy vaccine 1:10 GO   wife here His breathing is doing well with no recent exacerbation. Little cough and very little wheeze, no phlegm. Is mostly concerned about elevated blood pressures and is following with cardiology. Some question about renal function which she will discuss with Dr [name].  01/04/15- 30 year old M former smoker(120 pk yr) followed for severe chronic  asthma/ COPD, OSA/CPAP, allergic rhinitis, complicated by HBP, CAD/ CABG/ stent,  history of AFib/ pacemaker , GERD, recurrent aspiration pneumonia. Allergy vaccine 1:10 GO    CPAP 12/ [facility] FOLLOWS FOR Pt states breathing unchanged since last visit. Wears CPAP every night and at nap times. Continues with allergy vaccine    Wife here Another stent palced. CT chest reviewed CT a chest 11/25/14 IMPRESSION: 1. No evidence of pulmonary embolism or other acute disease. 2. Emphysema and advanced atherosclerosis. Electronically  Signed  By: Monte Fantasia M.D.  On: 11/25/2014 02:11    ROS-see HPI Constitutional:   No-   weight loss, night sweats, fevers, chills, fatigue, lassitude. HEENT:   No-  headaches, difficulty swallowing, tooth/dental problems, sore throat,       No-  sneezing, itching, ear ache, nasal congestion, post nasal drip,  CV:  No-   chest pain, orthopnea, PND, swelling in lower extremities, anasarca, dizziness, palpitations Resp: +shortness of breath with exertion or at rest.              No-   productive cough,  + non-productive cough,  No- coughing up of blood.              No-change in color of mucus.  No- wheezing.   Skin: No-   rash or lesions. GI:  No-   heartburn, indigestion, abdominal pain, nausea, vomiting,  GU:  MS:  No-   joint pain or swelling.   Neuro-     nothing unusual Psych:  No- change in mood or affect. No depression or anxiety.  No memory loss.  Objective:  OBJ- Physical Exam General- Alert, Oriented, Affect-appropriate, Distress- none acute Skin- + staining on forearms from old ecchymoses Lymphadenopathy- none Head- atraumatic            Eyes- Gross vision intact, PERRLA, conjunctivae and secretions clear            Ears- + Hearing aid            Nose- Clear, no-Septal dev, mucus, polyps, erosion, perforation             Throat- Mallampati III , mucosa clear , drainage- none, tonsils- atrophic Neck- flexible , trachea midline, no stridor , thyroid nl, carotid no bruit Chest - symmetrical excursion , unlabored           Heart/CV- RRR ,murmur+2/6 syst , no gallop  , no rub, nl s1 s2                           - JVD- none , edema-none, stasis changes- none, varices- none           Lung- +Clear/distant, wheeze- none, unlabored, cough- none , dullness-none, rub- none           Chest wall- Hyperinflated barrel chest Abd-  Br/ Gen/ Rectal- Not done, not indicated Extrem- cyanosis- none, clubbing, none, atrophy- none, strength- nl Neuro- grossly intact to observation.  No tremor

## 2015-01-04 NOTE — Patient Instructions (Addendum)
Sample Dulera 100   Continue 2 puffs, then rinse mouth, twice daily  Please call as needed

## 2015-01-05 NOTE — Assessment & Plan Note (Addendum)
Currently controlled. Anticipate seasonal changes. Discussed meds. Sample Dulera 100

## 2015-01-05 NOTE — Assessment & Plan Note (Signed)
Good compliance and control. Depends on CPAP. Sleeping well. Continue pressure 12

## 2015-01-05 NOTE — Assessment & Plan Note (Signed)
Continues allergy vaccine without problems Suggest continue anothyer year, then consider stopping for observation.

## 2015-01-23 DIAGNOSIS — M25531 Pain in right wrist: Secondary | ICD-10-CM | POA: Diagnosis not present

## 2015-02-12 ENCOUNTER — Other Ambulatory Visit: Payer: Self-pay | Admitting: Internal Medicine

## 2015-02-15 DIAGNOSIS — I6523 Occlusion and stenosis of bilateral carotid arteries: Secondary | ICD-10-CM | POA: Diagnosis not present

## 2015-02-15 DIAGNOSIS — R0989 Other specified symptoms and signs involving the circulatory and respiratory systems: Secondary | ICD-10-CM | POA: Diagnosis not present

## 2015-02-15 DIAGNOSIS — I6529 Occlusion and stenosis of unspecified carotid artery: Secondary | ICD-10-CM | POA: Diagnosis not present

## 2015-02-15 LAB — CBC WITH DIFFERENTIAL/PLATELET
BASOS PCT: 0 % (ref 0–1)
Basophils Absolute: 0 10*3/uL (ref 0.0–0.1)
EOS ABS: 0.1 10*3/uL (ref 0.0–0.7)
Eosinophils Relative: 2 % (ref 0–5)
HCT: 42.8 % (ref 39.0–52.0)
HEMOGLOBIN: 14.1 g/dL (ref 13.0–17.0)
Lymphocytes Relative: 29 % (ref 12–46)
Lymphs Abs: 1.8 10*3/uL (ref 0.7–4.0)
MCH: 29.7 pg (ref 26.0–34.0)
MCHC: 32.9 g/dL (ref 30.0–36.0)
MCV: 90.1 fL (ref 78.0–100.0)
MONO ABS: 0.6 10*3/uL (ref 0.1–1.0)
MONOS PCT: 10 % (ref 3–12)
MPV: 9.2 fL (ref 8.6–12.4)
Neutro Abs: 3.6 10*3/uL (ref 1.7–7.7)
Neutrophils Relative %: 59 % (ref 43–77)
PLATELETS: 184 10*3/uL (ref 150–400)
RBC: 4.75 MIL/uL (ref 4.22–5.81)
RDW: 14.1 % (ref 11.5–15.5)
WBC: 6.1 10*3/uL (ref 4.0–10.5)

## 2015-02-16 ENCOUNTER — Telehealth: Payer: Self-pay | Admitting: Cardiology

## 2015-02-16 LAB — BASIC METABOLIC PANEL
BUN: 18 mg/dL (ref 7–25)
CALCIUM: 9.2 mg/dL (ref 8.6–10.3)
CO2: 29 mmol/L (ref 20–31)
CREATININE: 0.94 mg/dL (ref 0.70–1.11)
Chloride: 99 mmol/L (ref 98–110)
GLUCOSE: 95 mg/dL (ref 65–99)
Potassium: 4.3 mmol/L (ref 3.5–5.3)
Sodium: 136 mmol/L (ref 135–146)

## 2015-02-16 LAB — APTT: aPTT: 34 seconds (ref 24–37)

## 2015-02-16 LAB — PROTIME-INR
INR: 1.2 (ref <1.50)
Prothrombin Time: 15.4 seconds — ABNORMAL HIGH (ref 11.6–15.2)

## 2015-02-16 LAB — TSH: TSH: 2.287 u[IU]/mL (ref 0.350–4.500)

## 2015-02-16 MED ORDER — DILTIAZEM HCL ER COATED BEADS 120 MG PO CP24: 120.0000 mg | ORAL_CAPSULE | Freq: Two times a day (BID) | ORAL | Status: DC

## 2015-02-16 NOTE — Telephone Encounter (Signed)
Refill for cardizem submitted, pt aware.

## 2015-02-16 NOTE — Telephone Encounter (Signed)
She wants to know why his Diltiazem was denied.Does Dr Warren Lacy want him to stop taking it?

## 2015-02-22 ENCOUNTER — Encounter (HOSPITAL_COMMUNITY)
Admission: AD | Disposition: A | Discharge status: Home Health | Payer: Self-pay | Source: Ambulatory Visit | Attending: Cardiology

## 2015-02-22 ENCOUNTER — Encounter (HOSPITAL_COMMUNITY): Payer: Self-pay | Admitting: Cardiovascular Disease

## 2015-02-22 ENCOUNTER — Inpatient Hospital Stay (HOSPITAL_COMMUNITY)
Admission: AD | Admit: 2015-02-22 | Discharge: 2015-02-25 | DRG: 253 | Disposition: A | Discharge status: Home Health | Payer: Medicare Other | Source: Ambulatory Visit | Attending: Cardiology | Admitting: Cardiology

## 2015-02-22 DIAGNOSIS — I739 Peripheral vascular disease, unspecified: Secondary | ICD-10-CM | POA: Diagnosis not present

## 2015-02-22 DIAGNOSIS — R112 Nausea with vomiting, unspecified: Secondary | ICD-10-CM | POA: Diagnosis not present

## 2015-02-22 DIAGNOSIS — T402X5A Adverse effect of other opioids, initial encounter: Secondary | ICD-10-CM | POA: Diagnosis not present

## 2015-02-22 DIAGNOSIS — I2510 Atherosclerotic heart disease of native coronary artery without angina pectoris: Secondary | ICD-10-CM | POA: Diagnosis present

## 2015-02-22 DIAGNOSIS — Z91048 Other nonmedicinal substance allergy status: Secondary | ICD-10-CM

## 2015-02-22 DIAGNOSIS — K567 Ileus, unspecified: Secondary | ICD-10-CM | POA: Diagnosis not present

## 2015-02-22 DIAGNOSIS — I771 Stricture of artery: Secondary | ICD-10-CM | POA: Diagnosis present

## 2015-02-22 DIAGNOSIS — Z87891 Personal history of nicotine dependence: Secondary | ICD-10-CM | POA: Diagnosis not present

## 2015-02-22 DIAGNOSIS — Z885 Allergy status to narcotic agent status: Secondary | ICD-10-CM

## 2015-02-22 DIAGNOSIS — E785 Hyperlipidemia, unspecified: Secondary | ICD-10-CM | POA: Diagnosis present

## 2015-02-22 DIAGNOSIS — I10 Essential (primary) hypertension: Secondary | ICD-10-CM | POA: Diagnosis present

## 2015-02-22 DIAGNOSIS — I480 Paroxysmal atrial fibrillation: Secondary | ICD-10-CM | POA: Diagnosis not present

## 2015-02-22 DIAGNOSIS — Z7902 Long term (current) use of antithrombotics/antiplatelets: Secondary | ICD-10-CM

## 2015-02-22 DIAGNOSIS — Z888 Allergy status to other drugs, medicaments and biological substances status: Secondary | ICD-10-CM

## 2015-02-22 DIAGNOSIS — Z7951 Long term (current) use of inhaled steroids: Secondary | ICD-10-CM

## 2015-02-22 DIAGNOSIS — Z955 Presence of coronary angioplasty implant and graft: Secondary | ICD-10-CM | POA: Diagnosis not present

## 2015-02-22 DIAGNOSIS — J449 Chronic obstructive pulmonary disease, unspecified: Secondary | ICD-10-CM | POA: Diagnosis present

## 2015-02-22 DIAGNOSIS — I252 Old myocardial infarction: Secondary | ICD-10-CM | POA: Diagnosis not present

## 2015-02-22 DIAGNOSIS — Z01818 Encounter for other preprocedural examination: Secondary | ICD-10-CM

## 2015-02-22 DIAGNOSIS — Z7901 Long term (current) use of anticoagulants: Secondary | ICD-10-CM

## 2015-02-22 DIAGNOSIS — Z79899 Other long term (current) drug therapy: Secondary | ICD-10-CM | POA: Diagnosis not present

## 2015-02-22 DIAGNOSIS — G4733 Obstructive sleep apnea (adult) (pediatric): Secondary | ICD-10-CM | POA: Diagnosis not present

## 2015-02-22 DIAGNOSIS — Z951 Presence of aortocoronary bypass graft: Secondary | ICD-10-CM

## 2015-02-22 DIAGNOSIS — I708 Atherosclerosis of other arteries: Secondary | ICD-10-CM | POA: Diagnosis not present

## 2015-02-22 DIAGNOSIS — Z4659 Encounter for fitting and adjustment of other gastrointestinal appliance and device: Secondary | ICD-10-CM

## 2015-02-22 DIAGNOSIS — R0789 Other chest pain: Secondary | ICD-10-CM

## 2015-02-22 DIAGNOSIS — R1110 Vomiting, unspecified: Secondary | ICD-10-CM

## 2015-02-22 HISTORY — PX: PERIPHERAL VASCULAR CATHETERIZATION: SHX172C

## 2015-02-22 LAB — POCT ACTIVATED CLOTTING TIME
Activated Clotting Time: 196 seconds
Activated Clotting Time: 147 seconds

## 2015-02-22 SURGERY — UPPER EXTREMITY ANGIOGRAPHY
Anesthesia: LOCAL

## 2015-02-22 MED ORDER — ACETAMINOPHEN 325 MG PO TABS
650.0000 mg | ORAL_TABLET | ORAL | Status: DC | PRN
  Administered 2015-02-23 – 2015-02-24 (×2): 650 mg via ORAL
  Filled 2015-02-22 (×2): qty 2

## 2015-02-22 MED ORDER — CLOPIDOGREL BISULFATE 75 MG PO TABS: 75.0000 mg | ORAL_TABLET | Freq: Every day | ORAL | Status: DC

## 2015-02-22 MED ORDER — CLONIDINE HCL 0.3 MG PO TABS
0.3000 mg | ORAL_TABLET | Freq: Two times a day (BID) | ORAL | Status: DC
  Administered 2015-02-22 – 2015-02-25 (×5): 0.3 mg via ORAL
  Filled 2015-02-22 (×2): qty 3
  Filled 2015-02-22: qty 1
  Filled 2015-02-22: qty 3
  Filled 2015-02-22 (×2): qty 1

## 2015-02-22 MED ORDER — ONDANSETRON HCL 4 MG/2ML IJ SOLN
4.0000 mg | Freq: Once | INTRAMUSCULAR | Status: AC
  Administered 2015-02-22: 4 mg via INTRAVENOUS

## 2015-02-22 MED ORDER — LATANOPROST 0.005 % OP SOLN
1.0000 [drp] | Freq: Every day | OPHTHALMIC | Status: DC
  Administered 2015-02-23 – 2015-02-24 (×3): 1 [drp] via OPHTHALMIC
  Filled 2015-02-22: qty 2.5

## 2015-02-22 MED ORDER — LABETALOL HCL 5 MG/ML IV SOLN
20.0000 mg | Freq: Once | INTRAVENOUS | Status: AC
  Administered 2015-02-22: 20 mg via INTRAVENOUS
  Filled 2015-02-22: qty 4

## 2015-02-22 MED ORDER — MONTELUKAST SODIUM 10 MG PO TABS
10.0000 mg | ORAL_TABLET | Freq: Every day | ORAL | Status: DC
  Administered 2015-02-23 – 2015-02-24 (×2): 10 mg via ORAL
  Filled 2015-02-22 (×3): qty 1

## 2015-02-22 MED ORDER — CLOPIDOGREL BISULFATE 75 MG PO TABS
75.0000 mg | ORAL_TABLET | Freq: Every day | ORAL | Status: DC
  Administered 2015-02-24 – 2015-02-25 (×2): 75 mg via ORAL
  Filled 2015-02-22 (×2): qty 1

## 2015-02-22 MED ORDER — APIXABAN 5 MG PO TABS: 5.0000 mg | ORAL_TABLET | Freq: Two times a day (BID) | ORAL | Status: DC

## 2015-02-22 MED ORDER — NITROGLYCERIN IN D5W 200-5 MCG/ML-% IV SOLN
INTRAVENOUS | Status: AC
  Filled 2015-02-22: qty 250

## 2015-02-22 MED ORDER — LORAZEPAM 0.5 MG PO TABS
0.5000 mg | ORAL_TABLET | Freq: Three times a day (TID) | ORAL | Status: DC
  Administered 2015-02-22 – 2015-02-25 (×6): 0.5 mg via ORAL
  Filled 2015-02-22 (×6): qty 1

## 2015-02-22 MED ORDER — ONDANSETRON HCL 4 MG/2ML IJ SOLN
4.0000 mg | Freq: Four times a day (QID) | INTRAMUSCULAR | Status: DC | PRN
  Administered 2015-02-22 – 2015-02-23 (×2): 4 mg via INTRAVENOUS
  Filled 2015-02-22 (×4): qty 2

## 2015-02-22 MED ORDER — TORSEMIDE 20 MG PO TABS
20.0000 mg | ORAL_TABLET | Freq: Every day | ORAL | Status: DC
  Administered 2015-02-24 – 2015-02-25 (×2): 20 mg via ORAL
  Filled 2015-02-22 (×3): qty 1

## 2015-02-22 MED ORDER — RISAQUAD PO CAPS
1.0000 | ORAL_CAPSULE | Freq: Every day | ORAL | Status: DC
  Administered 2015-02-23 – 2015-02-24 (×2): 1 via ORAL
  Filled 2015-02-22 (×4): qty 1

## 2015-02-22 MED ORDER — PROMETHAZINE HCL 25 MG/ML IJ SOLN
12.5000 mg | Freq: Once | INTRAMUSCULAR | Status: AC
  Administered 2015-02-22: 21:00:00 12.5 mg via INTRAVENOUS
  Filled 2015-02-22: qty 1

## 2015-02-22 MED ORDER — DILTIAZEM HCL ER COATED BEADS 120 MG PO CP24
120.0000 mg | ORAL_CAPSULE | Freq: Two times a day (BID) | ORAL | Status: DC
  Administered 2015-02-23 – 2015-02-24 (×3): 120 mg via ORAL
  Filled 2015-02-22 (×5): qty 1

## 2015-02-22 MED ORDER — ACETAMINOPHEN 325 MG PO TABS: 325.0000 mg | ORAL_TABLET | Freq: Four times a day (QID) | ORAL | Status: DC | PRN

## 2015-02-22 MED ORDER — ASPIRIN EC 325 MG PO TBEC: 325.0000 mg | DELAYED_RELEASE_TABLET | Freq: Every day | ORAL | Status: DC

## 2015-02-22 MED ORDER — HEPARIN SODIUM (PORCINE) 1000 UNIT/ML IJ SOLN
INTRAMUSCULAR | Status: AC
  Filled 2015-02-22: qty 1

## 2015-02-22 MED ORDER — BUDESONIDE 0.25 MG/2ML IN SUSP
0.2500 mg | Freq: Every day | RESPIRATORY_TRACT | Status: DC
  Administered 2015-02-22 – 2015-02-24 (×3): 0.25 mg via RESPIRATORY_TRACT
  Filled 2015-02-22 (×5): qty 2

## 2015-02-22 MED ORDER — PANTOPRAZOLE SODIUM 40 MG PO TBEC
40.0000 mg | DELAYED_RELEASE_TABLET | Freq: Two times a day (BID) | ORAL | Status: DC
  Administered 2015-02-22: 17:00:00 40 mg via ORAL
  Filled 2015-02-22: qty 1

## 2015-02-22 MED ORDER — HEPARIN SODIUM (PORCINE) 1000 UNIT/ML IJ SOLN
INTRAMUSCULAR | Status: DC | PRN
  Administered 2015-02-22: 6000 [IU] via INTRAVENOUS
  Administered 2015-02-22: 2000 [IU] via INTRAVENOUS

## 2015-02-22 MED ORDER — LIDOCAINE HCL (PF) 1 % IJ SOLN
INTRAMUSCULAR | Status: DC | PRN
  Administered 2015-02-22: 09:00:00

## 2015-02-22 MED ORDER — IPRATROPIUM-ALBUTEROL 0.5-2.5 (3) MG/3ML IN SOLN
3.0000 mL | Freq: Four times a day (QID) | RESPIRATORY_TRACT | Status: DC | PRN
  Administered 2015-02-22 – 2015-02-23 (×2): 3 mL via RESPIRATORY_TRACT
  Filled 2015-02-22 (×2): qty 3

## 2015-02-22 MED ORDER — HEPARIN (PORCINE) IN NACL 2-0.9 UNIT/ML-% IJ SOLN
INTRAMUSCULAR | Status: AC
  Filled 2015-02-22: qty 1000

## 2015-02-22 MED ORDER — POTASSIUM CHLORIDE CRYS ER 20 MEQ PO TBCR
20.0000 meq | EXTENDED_RELEASE_TABLET | Freq: Two times a day (BID) | ORAL | Status: DC
  Administered 2015-02-22 – 2015-02-25 (×5): 20 meq via ORAL
  Filled 2015-02-22 (×5): qty 1

## 2015-02-22 MED ORDER — ASPIRIN 81 MG PO CHEW: 81.0000 mg | CHEWABLE_TABLET | ORAL | Status: DC

## 2015-02-22 MED ORDER — SODIUM CHLORIDE 0.9 % IJ SOLN: 3.0000 mL | INTRAMUSCULAR | Status: DC | PRN

## 2015-02-22 MED ORDER — PRAVASTATIN SODIUM 40 MG PO TABS
80.0000 mg | ORAL_TABLET | Freq: Every day | ORAL | Status: DC
  Administered 2015-02-23 – 2015-02-24 (×2): 80 mg via ORAL
  Filled 2015-02-22 (×2): qty 2

## 2015-02-22 MED ORDER — METOPROLOL TARTRATE 25 MG PO TABS
25.0000 mg | ORAL_TABLET | Freq: Three times a day (TID) | ORAL | Status: DC
  Administered 2015-02-22: 25 mg via ORAL
  Filled 2015-02-22: qty 1

## 2015-02-22 MED ORDER — ISOSORBIDE MONONITRATE ER 60 MG PO TB24
60.0000 mg | ORAL_TABLET | Freq: Every day | ORAL | Status: DC
  Administered 2015-02-24 – 2015-02-25 (×2): 60 mg via ORAL
  Filled 2015-02-22 (×2): qty 1

## 2015-02-22 MED ORDER — MORPHINE SULFATE (PF) 2 MG/ML IV SOLN
2.0000 mg | INTRAVENOUS | Status: DC | PRN
  Administered 2015-02-22: 19:00:00 2 mg via INTRAVENOUS
  Filled 2015-02-22: qty 1

## 2015-02-22 MED ORDER — IODIXANOL 320 MG/ML IV SOLN
INTRAVENOUS | Status: DC | PRN
  Administered 2015-02-22: 95 mL via INTRA_ARTERIAL

## 2015-02-22 MED ORDER — PHILLIPS COLON HEALTH PO CAPS: ORAL_CAPSULE | Freq: Every evening | ORAL | Status: DC

## 2015-02-22 MED ORDER — HYDRALAZINE HCL 20 MG/ML IJ SOLN
10.0000 mg | INTRAMUSCULAR | Status: DC | PRN
  Administered 2015-02-22 – 2015-02-23 (×4): 10 mg via INTRAVENOUS
  Filled 2015-02-22 (×5): qty 1

## 2015-02-22 MED ORDER — LIDOCAINE HCL (PF) 1 % IJ SOLN
INTRAMUSCULAR | Status: AC
  Filled 2015-02-22: qty 30

## 2015-02-22 MED ORDER — SODIUM CHLORIDE 0.9 % IV SOLN
INTRAVENOUS | Status: AC
  Administered 2015-02-22: 10:00:00 via INTRAVENOUS

## 2015-02-22 MED ORDER — HYDRALAZINE HCL 50 MG PO TABS
100.0000 mg | ORAL_TABLET | Freq: Three times a day (TID) | ORAL | Status: DC
  Administered 2015-02-22 – 2015-02-24 (×6): 100 mg via ORAL
  Filled 2015-02-22 (×7): qty 2

## 2015-02-22 MED ORDER — CALCIUM CARBONATE-VITAMIN D 500-200 MG-UNIT PO TABS
1.0000 | ORAL_TABLET | Freq: Two times a day (BID) | ORAL | Status: DC
  Administered 2015-02-23 – 2015-02-25 (×4): 1 via ORAL
  Filled 2015-02-22 (×4): qty 1

## 2015-02-22 MED ORDER — LOSARTAN POTASSIUM 50 MG PO TABS
100.0000 mg | ORAL_TABLET | Freq: Every evening | ORAL | Status: DC
  Administered 2015-02-22 – 2015-02-24 (×3): 100 mg via ORAL
  Filled 2015-02-22 (×3): qty 2

## 2015-02-22 MED ORDER — SODIUM CHLORIDE 0.9 % WEIGHT BASED INFUSION: 1.0000 mL/kg/h | INTRAVENOUS | Status: DC

## 2015-02-22 MED ORDER — ALBUTEROL SULFATE (2.5 MG/3ML) 0.083% IN NEBU
3.0000 mL | INHALATION_SOLUTION | Freq: Four times a day (QID) | RESPIRATORY_TRACT | Status: DC | PRN
  Administered 2015-02-24: 3 mL via RESPIRATORY_TRACT
  Filled 2015-02-22: qty 3

## 2015-02-22 MED ORDER — NITROGLYCERIN IN D5W 200-5 MCG/ML-% IV SOLN
5.0000 ug/min | INTRAVENOUS | Status: DC
  Administered 2015-02-22 (×2): 5 ug/min via INTRAVENOUS
  Administered 2015-02-23: 150 ug/min via INTRAVENOUS
  Administered 2015-02-23: 100 ug/min via INTRAVENOUS
  Administered 2015-02-23: 125 ug/min via INTRAVENOUS
  Filled 2015-02-22 (×6): qty 250

## 2015-02-22 MED ORDER — LABETALOL HCL 5 MG/ML IV SOLN
20.0000 mg | Freq: Once | INTRAVENOUS | Status: AC
  Administered 2015-02-22: 18:00:00 20 mg via INTRAVENOUS
  Filled 2015-02-22: qty 4

## 2015-02-22 MED ORDER — SODIUM CHLORIDE 0.9 % WEIGHT BASED INFUSION
3.0000 mL/kg/h | INTRAVENOUS | Status: DC
  Administered 2015-02-22: 3 mL/kg/h via INTRAVENOUS

## 2015-02-22 MED ORDER — NITROGLYCERIN 0.4 MG SL SUBL: 0.4000 mg | SUBLINGUAL_TABLET | SUBLINGUAL | Status: DC | PRN

## 2015-02-22 MED ORDER — HYDRALAZINE HCL 20 MG/ML IJ SOLN
10.0000 mg | Freq: Once | INTRAMUSCULAR | Status: AC
  Administered 2015-02-22: 10 mg via INTRAVENOUS

## 2015-02-22 SURGICAL SUPPLY — 16 items
BALLN MUSTANG 5X20X135 (BALLOONS) ×2
BALLOON MUSTANG 5X20X135 (BALLOONS) IMPLANT
CATH ANGIO 5F JB1 100CM (CATHETERS) ×1 IMPLANT
DEVICE CONTINUOUS FLUSH (MISCELLANEOUS) ×1 IMPLANT
KIT ENCORE 26 ADVANTAGE (KITS) ×1 IMPLANT
KIT PV (KITS) ×2 IMPLANT
SHEATH BRITE TIP 7FR 90CM (SHEATH) ×1 IMPLANT
SHEATH PINNACLE 5F 10CM (SHEATH) ×1 IMPLANT
SHEATH PINNACLE 7F 10CM (SHEATH) ×1 IMPLANT
STENT OMNILINK ELITE 7X19X135 (Permanent Stent) ×1 IMPLANT
STOPCOCK MORSE 400PSI 3WAY (MISCELLANEOUS) ×1 IMPLANT
SYR MEDRAD MARK V 150ML (SYRINGE) ×2 IMPLANT
TRANSDUCER W/STOPCOCK (MISCELLANEOUS) ×2 IMPLANT
TRAY PV CATH (CUSTOM PROCEDURE TRAY) ×2 IMPLANT
WIRE HITORQ VERSACORE ST 145CM (WIRE) ×1 IMPLANT
WIRE VERSACORE LOC 115CM (WIRE) ×1 IMPLANT

## 2015-02-22 NOTE — Progress Notes (Signed)
Pt's BP has been difficult to control, he has IV hydralazine several times, IV NTG to control BP and IV labetalol - able to pull the sheath.  Will go ahead and give 2200 clonidine now.  He stated he took all his meds this AM.  Looking at previous office visit notes the BP is controlled.  His IV NTG will be weaned off tonight.

## 2015-02-22 NOTE — Progress Notes (Signed)
Patient still has poor BP control on high dose of ntg drip. Patient was also given morphine for back pain and developed nausea and vomiting. MD two times for orders and update. Sheath still in place unable to remove due to BP site WNL.  I will continue to monitor and pull sheath when able.

## 2015-02-22 NOTE — Progress Notes (Signed)
Per discussion with family and patient, blood pressure control at home has been less than ideal.  Wife using PO hydralazine prn; perhaps not effectively or with proper parameters.  Recommend a review of home meds and discussion between PCP and wife.

## 2015-02-22 NOTE — Interval H&P Note (Signed)
History and Physical Interval Note:  02/22/2015 7:34 AM  [name].  has presented today for surgery, with the diagnosis of subclavian stenosis  The various methods of treatment have been discussed with the patient and family. After consideration of risks, benefits and other options for treatment, the patient has consented to  Procedure(s): Upper Extremity Angiography (N/A) as a surgical intervention .  The patient's history has been reviewed, patient examined, no change in status, stable for surgery.  I have reviewed the patient's chart and labs.  Questions were answered to the patient's satisfaction.     Quay Burow

## 2015-02-22 NOTE — Progress Notes (Signed)
Patient arrived on unit approx. 1030 from cath holding.  Medicated eight times (not including multiple nitro drip titrations) throughout the day in an attempt to decrease SBP to < 150 for right femoral arterial sheath pull.  Working with Cecilie Kicks, NP.  New parameter for sheath pull set at SBP < 160.  Hand off to night shift at 1900 to continue the effort.

## 2015-02-22 NOTE — H&P (Signed)
Admission note  Primary Physician Gwendolyn Grant, MD Primary Cardiologist: Lorretta Harp MD Renae Gloss   HPI: [name] is a 81 year old ill-appearing married Caucasian male father of 2 children copy by his wife today. He is a patient of Dr. Percival Spanish and recently underwent cardiac catheterization by Dr. Burt Knack. He has a history of hypertension and hyperlipidemia. He has not had a stroke before. He has paroxysmal atrial fibrillation on oral anticoagulation.[name] is referred to me by Dr. Burt Knack for evaluation and potential treatment of left subclavian artery stenosis. He has a history of remote cornea artery bypass grafting by Dr. Prescott Gum approximately 16 years ago.he also has a history of bilateral internal carotid artery stenosis. He recently had a non-STEMI and underwent cardiac catheterization by Dr. Burt Knack on 11/26/14 revealing a subtotally occluded PDA vein graft. Dr. Burt Knack intervened on the native right coronary artery. He did demonstrate a 75% proximal left subclavian artery stenosis with underfilling of his LIMA graft and with retrograde filling of the LIMA graft with left main injection. The patient does have a differential blood pressures upper extremities and duplex performed 12/17/14 revealed apparent flow in the left vertebral, high-grade left subclavian artery stenosis. He denies subclavian steel work upper extremity claudication although clearly hemodynamically this is a significant lesion proximal to the LIMA graft. He will benefit from left to prevent artery stenting. He wishes to delay this until early November which I'm okay with. He will stop his Eliquis 2 days prior to intervention.   Current Outpatient Prescriptions  Medication Sig Dispense Refill  . albuterol (PROVENTIL HFA;VENTOLIN HFA) 108 (90 BASE) MCG/ACT inhaler Inhale 2 puffs into the lungs every 6 (six) hours as needed for wheezing or shortness of breath. 8 g 5  . apixaban (ELIQUIS) 5 MG  TABS tablet Take 1 tablet (5 mg total) by mouth 2 (two) times daily. 60 tablet 5  . budesonide (PULMICORT) 0.25 MG/2ML nebulizer solution Take 0.25 mg by nebulization at bedtime. For asthma    . calcium-vitamin D (OSCAL WITH D) 500-200 MG-UNIT per tablet Take 1 tablet by mouth 2 (two) times daily.    . cholestyramine (QUESTRAN) 4 G packet take 1 packet by mouth twice a day if needed for LOOSE STOOLS (Patient taking differently: Take 1 packet by mouth daily) 60 each 2  . cloNIDine (CATAPRES) 0.3 MG tablet Take 1 tablet (0.3 mg total) by mouth 2 (two) times daily. 60 tablet 0  . clopidogrel (PLAVIX) 75 MG tablet Take 1 tablet (75 mg total) by mouth daily with breakfast. 30 tablet 03  . denosumab (PROLIA) 60 MG/ML SOLN injection Inject 60 mg into the skin every 6 (six) months. Administer in upper arm, thigh, or abdomen 1 mL 5  . diltiazem (CARDIZEM CD) 120 MG 24 hr capsule Take 1 capsule (120 mg total) by mouth 2 (two) times daily. 180 capsule 3  . DULERA 100-5 MCG/ACT AERO inhale 2 puffs by mouth once daily (THEN RINSE MOUTH AFTER USE) 17.6 g 5  . FLUZONE HIGH-DOSE 0.5 ML SUSY inject 0.5 milliliter intramuscularly  0  . hydrALAZINE (APRESOLINE) 50 MG tablet Take 2 tablets (100 mg total) by mouth 3 (three) times daily. Or as directed (Patient taking differently: Take 100 mg by mouth 2 (two) times daily. Or as directed) 90 tablet 5  . ipratropium-albuterol (DUONEB) 0.5-2.5 (3) MG/3ML SOLN USE ONE VIAL VIA NEBULIZER EVERY 6 HOURS AS NEEDED FOR SHORTNESS OF BREATH 360 mL 5  . isosorbide mononitrate (IMDUR) 60 MG 24 hr  tablet Take 1 tablet (60 mg total) by mouth daily. 90 tablet 3  . latanoprost (XALATAN) 0.005 % ophthalmic solution Place 1 drop into both eyes at bedtime.     Marland Kitchen LORazepam (ATIVAN) 0.5 MG tablet Take 1 tablet (0.5 mg total) by mouth 3 (three) times daily. 90 tablet 5  . losartan (COZAAR) 100 MG tablet Take 1 tablet  (100 mg total) by mouth every evening. 90 tablet 3  . metoprolol tartrate (LOPRESSOR) 25 MG tablet Take 1 tablet (25 mg total) by mouth 3 (three) times daily. 90 tablet 3  . montelukast (SINGULAIR) 10 MG tablet Take 1 tablet (10 mg total) by mouth at bedtime. 90 tablet 3  . nitroGLYCERIN (NITROSTAT) 0.4 MG SL tablet Place 1 tablet (0.4 mg total) under the tongue every 5 (five) minutes as needed for chest pain. 25 tablet 0  . NONFORMULARY OR COMPOUNDED ITEM Allergy Vaccine 1:10 Given at Home    . pantoprazole (PROTONIX) 40 MG tablet take 1 tablet by mouth twice a day 60 tablet 1  . potassium chloride SA (K-DUR,KLOR-CON) 20 MEQ tablet Take 1 tablet (20 mEq total) by mouth 2 (two) times daily. 60 tablet 6  . pravastatin (PRAVACHOL) 80 MG tablet take 1 tablet by mouth every morning 30 tablet 2  . Probiotic Product (PHILLIPS COLON HEALTH PO) Take 1 capsule by mouth every evening.     Marland Kitchen RA VITAMIN B-12 TR 1000 MCG TBCR take 1 tablet by mouth once daily 30 tablet 11  . torsemide (DEMADEX) 20 MG tablet Take 1 tablet (20 mg total) by mouth daily. 30 tablet 0   No current facility-administered medications for this visit.    Allergies  Allergen Reactions  . Ace Inhibitors Other (See Comments)    Severe asthma (COPD)  . Other Other (See Comments)    Maple trees, allergy symptoms  . Codeine Nausea And Vomiting  . Lasix [Furosemide] Itching    Social History   Social History  . Marital Status: Married    Spouse Name: N/A  . Number of Children: 2  . Years of Education: N/A   Occupational History  . retired     107 years   Social History Main Topics  . Smoking status: Former Smoker -- 3.00 packs/day for 40 years    Types: Cigarettes    Quit date: 04/11/1979  . Smokeless tobacco: Never Used  . Alcohol Use: No     Comment: quit drinking 40+ years  . Drug Use:  No  . Sexual Activity: Yes   Other Topics Concern  . 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 Married lives with wife 2 children     Review of Systems: General: negative for chills, fever, night sweats or weight changes.  Cardiovascular: negative for chest pain, dyspnea on exertion, edema, orthopnea, palpitations, paroxysmal nocturnal dyspnea or shortness of breath Dermatological: negative for rash Respiratory: negative for cough or wheezing Urologic: negative for hematuria Abdominal: negative for nausea, vomiting, diarrhea, bright red blood per rectum, melena, or hematemesis Neurologic: negative for visual changes, syncope, or dizziness All other systems reviewed and are otherwise negative except as noted above.    Blood pressure 138/60, pulse 60, height 5\' 2"  (1.575 m), weight 154 lb (69.854 kg).  General appearance: alert and no distress Neck: no adenopathy, no JVD, supple, symmetrical, trachea midline, thyroid not enlarged, symmetric, no tenderness/mass/nodules and bilateral carotid bruits and left subclavian bruit Lungs: clear to auscultation bilaterally Heart:  2/6 outflow tract murmur Extremities: extremities normal, atraumatic, no cyanosis or edema  EKG not performed today  ASSESSMENT AND PLAN:   Subclavian artery stenosis, left Mr. Sholler is referred to me by Dr. Burt Knack for evaluation and potential treatment of left subclavian artery stenosis. He has a history of remote cornea artery bypass grafting by Dr. Prescott Gum approximately 16 years ago.he also has a history of bilateral internal carotid artery stenosis. He recently had a non-STEMI and underwent cardiac catheterization by Dr. Burt Knack on 11/26/14 revealing a subtotally occluded PDA vein graft. Dr. Burt Knack intervened on the native right coronary artery. He did demonstrate a 75% proximal left subclavian artery stenosis with underfilling of his LIMA graft and with retrograde  filling of the LIMA graft with left main injection. The patient does have a differential blood pressures upper extremities and duplex performed 12/17/14 revealed apparent flow in the left vertebral, high-grade left subclavian artery stenosis. He denies subclavian steel work upper extremity claudication although clearly hemodynamically this is a significant lesion proximal to the LIMA graft. He will benefit from left to prevent artery stenting. He wishes to delay this until early November which I'm okay with. He will stop his Eliquis 2 days prior to intervention. He presents now for LSCA PTA/Stent  Lorretta Harp, M.D., [place], [facility], [place], [place] [address] [place], [place]  [id number]  [phone] 02/22/2015 6:29 AM

## 2015-02-23 ENCOUNTER — Ambulatory Visit: Payer: Medicare Other | Admitting: Internal Medicine

## 2015-02-23 ENCOUNTER — Ambulatory Visit (HOSPITAL_COMMUNITY): Payer: Medicare Other

## 2015-02-23 DIAGNOSIS — R112 Nausea with vomiting, unspecified: Secondary | ICD-10-CM | POA: Diagnosis not present

## 2015-02-23 DIAGNOSIS — R140 Abdominal distension (gaseous): Secondary | ICD-10-CM | POA: Diagnosis not present

## 2015-02-23 DIAGNOSIS — Z4682 Encounter for fitting and adjustment of non-vascular catheter: Secondary | ICD-10-CM | POA: Diagnosis not present

## 2015-02-23 DIAGNOSIS — G4733 Obstructive sleep apnea (adult) (pediatric): Secondary | ICD-10-CM | POA: Diagnosis present

## 2015-02-23 DIAGNOSIS — Z91048 Other nonmedicinal substance allergy status: Secondary | ICD-10-CM | POA: Diagnosis not present

## 2015-02-23 DIAGNOSIS — I10 Essential (primary) hypertension: Secondary | ICD-10-CM | POA: Diagnosis not present

## 2015-02-23 DIAGNOSIS — Z87891 Personal history of nicotine dependence: Secondary | ICD-10-CM | POA: Diagnosis not present

## 2015-02-23 DIAGNOSIS — Z7901 Long term (current) use of anticoagulants: Secondary | ICD-10-CM | POA: Diagnosis not present

## 2015-02-23 DIAGNOSIS — Z7902 Long term (current) use of antithrombotics/antiplatelets: Secondary | ICD-10-CM | POA: Diagnosis not present

## 2015-02-23 DIAGNOSIS — J449 Chronic obstructive pulmonary disease, unspecified: Secondary | ICD-10-CM | POA: Diagnosis not present

## 2015-02-23 DIAGNOSIS — I252 Old myocardial infarction: Secondary | ICD-10-CM | POA: Diagnosis not present

## 2015-02-23 DIAGNOSIS — I2510 Atherosclerotic heart disease of native coronary artery without angina pectoris: Secondary | ICD-10-CM

## 2015-02-23 DIAGNOSIS — Z888 Allergy status to other drugs, medicaments and biological substances status: Secondary | ICD-10-CM | POA: Diagnosis not present

## 2015-02-23 DIAGNOSIS — E785 Hyperlipidemia, unspecified: Secondary | ICD-10-CM | POA: Diagnosis present

## 2015-02-23 DIAGNOSIS — I739 Peripheral vascular disease, unspecified: Secondary | ICD-10-CM | POA: Diagnosis not present

## 2015-02-23 DIAGNOSIS — I708 Atherosclerosis of other arteries: Secondary | ICD-10-CM | POA: Diagnosis not present

## 2015-02-23 DIAGNOSIS — Z951 Presence of aortocoronary bypass graft: Secondary | ICD-10-CM | POA: Diagnosis not present

## 2015-02-23 DIAGNOSIS — Z955 Presence of coronary angioplasty implant and graft: Secondary | ICD-10-CM | POA: Diagnosis not present

## 2015-02-23 DIAGNOSIS — Z79899 Other long term (current) drug therapy: Secondary | ICD-10-CM | POA: Diagnosis not present

## 2015-02-23 DIAGNOSIS — Z885 Allergy status to narcotic agent status: Secondary | ICD-10-CM | POA: Diagnosis not present

## 2015-02-23 DIAGNOSIS — T402X5A Adverse effect of other opioids, initial encounter: Secondary | ICD-10-CM | POA: Diagnosis not present

## 2015-02-23 DIAGNOSIS — R079 Chest pain, unspecified: Secondary | ICD-10-CM | POA: Diagnosis not present

## 2015-02-23 DIAGNOSIS — Z7951 Long term (current) use of inhaled steroids: Secondary | ICD-10-CM | POA: Diagnosis not present

## 2015-02-23 DIAGNOSIS — K567 Ileus, unspecified: Secondary | ICD-10-CM | POA: Diagnosis not present

## 2015-02-23 DIAGNOSIS — I480 Paroxysmal atrial fibrillation: Secondary | ICD-10-CM | POA: Diagnosis not present

## 2015-02-23 LAB — CBC
HCT: 36.1 % — ABNORMAL LOW (ref 39.0–52.0)
HEMOGLOBIN: 11.9 g/dL — AB (ref 13.0–17.0)
MCH: 30.1 pg (ref 26.0–34.0)
MCHC: 33 g/dL (ref 30.0–36.0)
MCV: 91.2 fL (ref 78.0–100.0)
Platelets: 171 10*3/uL (ref 150–400)
RBC: 3.96 MIL/uL — AB (ref 4.22–5.81)
RDW: 14.6 % (ref 11.5–15.5)
WBC: 9.7 10*3/uL (ref 4.0–10.5)

## 2015-02-23 LAB — BASIC METABOLIC PANEL
ANION GAP: 12 (ref 5–15)
BUN: 16 mg/dL (ref 6–20)
CALCIUM: 8.8 mg/dL — AB (ref 8.9–10.3)
CO2: 21 mmol/L — AB (ref 22–32)
Chloride: 104 mmol/L (ref 101–111)
Creatinine, Ser: 1.07 mg/dL (ref 0.61–1.24)
Glucose, Bld: 177 mg/dL — ABNORMAL HIGH (ref 65–99)
Potassium: 3.8 mmol/L (ref 3.5–5.1)
Sodium: 137 mmol/L (ref 135–145)

## 2015-02-23 LAB — PHOSPHORUS: PHOSPHORUS: 2.7 mg/dL (ref 2.5–4.6)

## 2015-02-23 LAB — HEPATIC FUNCTION PANEL
ALBUMIN: 3.7 g/dL (ref 3.5–5.0)
ALT: 17 U/L (ref 17–63)
AST: 19 U/L (ref 15–41)
Alkaline Phosphatase: 61 U/L (ref 38–126)
Bilirubin, Direct: 0.2 mg/dL (ref 0.1–0.5)
Indirect Bilirubin: 0.7 mg/dL (ref 0.3–0.9)
TOTAL PROTEIN: 5.8 g/dL — AB (ref 6.5–8.1)
Total Bilirubin: 0.9 mg/dL (ref 0.3–1.2)

## 2015-02-23 LAB — POCT ACTIVATED CLOTTING TIME
Activated Clotting Time: 233 seconds
Activated Clotting Time: 257 seconds

## 2015-02-23 LAB — MAGNESIUM: MAGNESIUM: 2 mg/dL (ref 1.7–2.4)

## 2015-02-23 LAB — GLUCOSE, CAPILLARY: Glucose-Capillary: 116 mg/dL — ABNORMAL HIGH (ref 65–99)

## 2015-02-23 LAB — MRSA PCR SCREENING: MRSA BY PCR: NEGATIVE

## 2015-02-23 MED ORDER — METOPROLOL TARTRATE 1 MG/ML IV SOLN
10.0000 mg | Freq: Four times a day (QID) | INTRAVENOUS | Status: DC
  Administered 2015-02-23: 10 mg via INTRAVENOUS

## 2015-02-23 MED ORDER — HYDRALAZINE HCL 20 MG/ML IJ SOLN: 20.0000 mg | INTRAMUSCULAR | Status: DC | PRN

## 2015-02-23 MED ORDER — SPIRONOLACTONE 25 MG PO TABS
25.0000 mg | ORAL_TABLET | Freq: Every day | ORAL | Status: DC
  Administered 2015-02-24 – 2015-02-25 (×2): 25 mg via ORAL
  Filled 2015-02-23 (×2): qty 1

## 2015-02-23 MED ORDER — PROMETHAZINE HCL 25 MG/ML IJ SOLN
12.5000 mg | Freq: Once | INTRAMUSCULAR | Status: DC
  Filled 2015-02-23: qty 1

## 2015-02-23 MED ORDER — METOPROLOL TARTRATE 50 MG PO TABS
100.0000 mg | ORAL_TABLET | Freq: Two times a day (BID) | ORAL | Status: DC
Start: 2015-02-23 — End: 2015-02-23

## 2015-02-23 MED ORDER — METOPROLOL TARTRATE 50 MG PO TABS: 50.0000 mg | ORAL_TABLET | Freq: Two times a day (BID) | ORAL | Status: DC

## 2015-02-23 MED ORDER — METOPROLOL TARTRATE 1 MG/ML IV SOLN
10.0000 mg | Freq: Four times a day (QID) | INTRAVENOUS | Status: DC
Start: 2015-02-23 — End: 2015-02-24
  Administered 2015-02-23: 10 mg via INTRAVENOUS
  Filled 2015-02-23: qty 10

## 2015-02-23 MED ORDER — METOPROLOL TARTRATE 1 MG/ML IV SOLN
INTRAVENOUS | Status: AC
  Filled 2015-02-23: qty 10

## 2015-02-23 MED ORDER — NICARDIPINE HCL IN NACL 20-0.86 MG/200ML-% IV SOLN
5.0000 mg/h | INTRAVENOUS | Status: DC
  Administered 2015-02-23: 5 mg/h via INTRAVENOUS
  Filled 2015-02-23: qty 200

## 2015-02-23 MED ORDER — LABETALOL HCL 5 MG/ML IV SOLN
20.0000 mg | INTRAVENOUS | Status: DC | PRN
  Administered 2015-02-23 (×2): 20 mg via INTRAVENOUS
  Filled 2015-02-23 (×2): qty 4

## 2015-02-23 MED ORDER — HYDRALAZINE HCL 20 MG/ML IJ SOLN: 10.0000 mg | Freq: Four times a day (QID) | INTRAMUSCULAR | Status: DC | PRN

## 2015-02-23 MED ORDER — APIXABAN 5 MG PO TABS
5.0000 mg | ORAL_TABLET | Freq: Two times a day (BID) | ORAL | Status: DC
  Administered 2015-02-24 – 2015-02-25 (×3): 5 mg via ORAL
  Filled 2015-02-23 (×3): qty 1

## 2015-02-23 MED ORDER — METOPROLOL TARTRATE 1 MG/ML IV SOLN
5.0000 mg | Freq: Once | INTRAVENOUS | Status: AC
  Administered 2015-02-23: 5 mg via INTRAVENOUS
  Filled 2015-02-23: qty 5

## 2015-02-23 MED ORDER — BISACODYL 10 MG RE SUPP
10.0000 mg | Freq: Once | RECTAL | Status: AC
  Administered 2015-02-23: 10 mg via RECTAL
  Filled 2015-02-23: qty 1

## 2015-02-23 MED ORDER — PANTOPRAZOLE SODIUM 40 MG IV SOLR
40.0000 mg | Freq: Two times a day (BID) | INTRAVENOUS | Status: DC
  Administered 2015-02-23 – 2015-02-25 (×5): 40 mg via INTRAVENOUS
  Filled 2015-02-23 (×5): qty 40

## 2015-02-23 MED ORDER — METOPROLOL TARTRATE 100 MG PO TABS
100.0000 mg | ORAL_TABLET | Freq: Two times a day (BID) | ORAL | Status: DC
  Administered 2015-02-24 – 2015-02-25 (×3): 100 mg via ORAL
  Filled 2015-02-23 (×3): qty 1
  Filled 2015-02-23: qty 2

## 2015-02-23 NOTE — Progress Notes (Signed)
SUBJECTIVE Family at bedside. Concerned he has not improved since yesterday. Patient denies any nausea, vomiting, abdominal pain though complaining of shaking. Feels warm/cold intermittently. 500cc bilious output over 12 hours by NG tube.  OBJECTIVE Filed Vitals:   02/23/15 1830  BP: 163/71  Pulse: 111  Temp:   Resp: 18   General: elderly Caucasian male, resting in bed, shaking, NG tube in place HEENT: PERRL, EOMI, no scleral icterus, dry mucous membranes Cardiac: tachycardic, no rubs, murmurs or gallops Abd: soft, nontender, nondistended, BS present Ext: warm and well perfused, no pedal or tibial edema Neuro: responds to questions appropriately; moving all extremities freely  ASSESSMENT Suspect he may be hypoglycemic though ileus may have resolved by now in the absence of symptoms. Per family, he did take Lomotil in the 24 hours prior to the procedure.  Post-procedural infection less likely though possible he may have had a viral infection prior to admission which is now declaring itself as CXR findings with possible bronchitis.    PLAN -Give dulcolax suppository and pull NG tube following BM -Advance diet as tolerated -Reassess tomorrow morning

## 2015-02-23 NOTE — Progress Notes (Signed)
Pt shivering and c/o chills. Axillary temp 99.9 degrees. Denies chest pain/tightness, denies SOB, denies dizziness. Denies nausea. Dr. Charlott Rakes suggested CBG to r/o hypoglycemia; BG in normal range. Discussed plan of care at bedside with Dr. Posey Pronto. Plan is to remove NG tube once pt has a BM since NG output only 450cc all shift. Once removed, will progress diet as tolerated. Will give dulcolax suppository per order.   Will continue to monitor. Family at bedside and updated by Dr. Posey Pronto about plan.   Henreitta Leber, RN 6:42 PM 02/23/2015

## 2015-02-23 NOTE — Progress Notes (Signed)
MD notified again about BP and throwing up not yet resolved. New orders received. Sheath still in place WNL.

## 2015-02-23 NOTE — Progress Notes (Addendum)
Subjective:  This AM, his daughter and wife are at bedside. He developed nausea, vomiting yesterday several hours after the procedure which worsened throughout the night after he received morphine. He reports feeling hungry without any abdominal pain or chest discomfort.  Objective:  Vital Signs in the last 24 hours: Temp:  [98 F (36.7 C)-99 F (37.2 C)] 99 F (37.2 C) (11/14 1945) Pulse Rate:  [0-117] 117 (11/15 0700) Resp:  [0-24] 20 (11/15 0700) BP: (128-202)/(43-87) 175/68 mmHg (11/15 0700) SpO2:  [0 %-100 %] 96 % (11/15 0700)  Intake/Output from previous day: 11/14 0701 - 11/15 0700 In: 781.3 [P.O.:120; I.V.:661.3] Out: 1025 [Urine:1025]  Physical Exam: General: elderly Caucasian male, resting in bed, NG tube in place HEENT: PERRL, EOMI, no scleral icterus, oropharynx with dry mucous membranes, no JVD Cardiac: RRR, no rubs, murmurs or gallops Pulm: mild wheezing noted in the L anterior lung field Abd: soft, nontender, nondistended, minimal BS present Ext: warm and well perfused, no pedal edema Neuro: responds to questions appropriately; moving all extremities freely    Lab Results:  Recent Labs  02/23/15 0436  WBC 9.7  HGB 11.9*  PLT 171    Recent Labs  02/23/15 0436  NA 137  K 3.8  CL 104  CO2 21*  GLUCOSE 177*  BUN 16  CREATININE 1.07   No results for input(s): TROPONINI in the last 72 hours.  Invalid input(s): CK, MB  Tele: Occasional PVCs.   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   Possible ileus: NG tube placed overnight per abdominal XR findings suggestive of mild ileus or obstruction. Prior history of cholecystectomy  and codeine intolerance which may have been worsened his symptoms overnight when he received morphine IV. LFTs reassuring this AM. No improvement with Zofran 4mg  IV or Phenergan 12.5mg  IV. No symptoms of nausea or vomiting this AM which is reassuring. -Continue NG tube for now -Avoid additional opiates or medications that might delay GI transit -Check Mg/P to see if contributory  Hypertension: BP trending 170s-190s/50-60s overnight on nitroglycerin and nicardipine gtt though improved this AM. Some concern for confusion regarding the medications at home as wife noted he has had persistently elevated BP in 200s for the past 2-3 weeks. Home medications include Cozaar 100mg , Lopressor 25mg  three times daily, Imdur 60mg , diltiazem 120mg  three times daily, clonidine 0.3mg  twice daily, torsemide 20mg .  -Increase metoprolol to 100mg  twice daily -Start spironolactone 25mg   -Hold clonidine and hydralazine if systolic BP [id number] -Continue other home meds    Chronic paroxysmal atrial fibrillation: Continue Eliquis 5mg  twice daily.   Severe multivessel CAD s/p CABG and DES of native RCA: Continue Plavix along with Eliquis. Defer ASA.  Hyperlipidemia: Continue pravastatin 80mg .  OSA: Wears CPAP machine at home though defer for now.  COPD: Home meds include albuterol, Singulair, Duonebs. -Continue home meds  Charlott Rakes, [facility] Pager: [phone]  02/23/2015, 8:21 AM    Personally seen and examined. Agree with above. Will add spironolactone 25mg  (occasionally will help with refractory HTN).   Increase metoprolol 100 BID (from 25 TID)  Family discussion. Patient has had years of difficult to control hypertension.   Nausea last night (3am). Quite severe.  Still producing bile from  OG tube  Will consult GI  Holding Eliquis until tomorrow.   Candee Furbish, MD

## 2015-02-23 NOTE — Progress Notes (Signed)
Patient has NGT in place with green bowel 100cc removed since 7am. NGT  clamped per Dr Posey Pronto. Patient tolerated for 10-15 minutes then c/o SOB nausea and chills HR 130 . NGT opened small amount of bowel removed. nausea relieved. See flow and medication sheet. Dr Posey Pronto at bedside. EKG obtained due to c/o chest heaviness. All am medications held due to nausea. MD aware

## 2015-02-23 NOTE — Progress Notes (Signed)
Spoke with PA Luz Brazen regarding NG tube placement and management for mild ileus. Patient had put out 100cc of mostly bilious output since 4am. Instructed to clamp tube after 6 hours and to reassess. Can try Reglan 5mg  or Dulcolax suppository as well. If no better, we can reconsult.   Called by RN regarding patient's flushing/chills and chest tightness. HR in the 130s. Gave metoprolol and hydralazine IV. HR improved to the 110s thereafter. Will order CXR to r/o any pulmonary process given symptoms. No abdominal guarding/distension. Unclamp tube for now and will reassess in a few hours.

## 2015-02-23 NOTE — Plan of Care (Signed)
Received multiple notifications from RN regarding persistent N/V despite zofran as well as uncontrolled hypertension despite home medications, NTG drip and prn boluses.  1 dose of phenergan IV ordered in addition to repeat boluses of hydralazine and labetalol.  Pt's BP remained above the parameters ordered for femoral arterial sheath removal.  Will move to ICU for more aggressive BP control for sheath removal and anti-emetic regimen.

## 2015-02-23 NOTE — Plan of Care (Signed)
Mr May was started on nicardipine drip with improvement in blood pressure.  However, he continues to have significant N/V.  Furthermore, he has started to complain of some mild chest pressure which I suspect is due to his tachycardia.  ECG confirms sinus tach, likely due to nicardipine.  However, given cath and hypertension will check CBC to make sure there has not been a significant drop in Hg.  On exam his BS are absent but abdomen is soft and nontender.  KUB reveals gaseous distention, however, CTA a few months ago appears to have a significant amount of stool and gas on scouting image as well as few CT slices of upper abdomen.  Last, he complains of some back pain that he has had since earlier today and attributes this to laying on his back for so long.  Given cath today along with marked hypertension, dissection would be in differential.  However, given description of symptoms and other findings I believe this to be unlikely at this time.   - Stop nicardipine - metoprolol x 1 for sinus tach - NGT with sucction for refractory N/V and KUB findings, abdominal exam appears benign at this time - check AM labs in addition to LFTs, not checking lipase as patient denies any pain and is non-tender

## 2015-02-23 NOTE — Progress Notes (Signed)
Site area: rt Contractor Prior to Removal: no hematoma   Level 0 Pressure Applied For: 45 min Manual: Manual hold   Patient Status During Pull:  A/O Post Pull Site: rt groin  Level 0 Post Pull Instructions Given:  Pt understands post sheath pull instructions.  Post Pull Pulses Present: Rt dp/pt 2+ Dressing Applied:  4x4 and medipore pressure dressing Bedrest begins @ 10:45:00 Comments: Penny-RN in to evaluate rt groin. Level 0.  Bp 174/69  Hr 120-130 sinus tach

## 2015-02-24 DIAGNOSIS — I739 Peripheral vascular disease, unspecified: Secondary | ICD-10-CM

## 2015-02-24 DIAGNOSIS — I480 Paroxysmal atrial fibrillation: Secondary | ICD-10-CM

## 2015-02-24 LAB — BASIC METABOLIC PANEL
Anion gap: 8 (ref 5–15)
BUN: 14 mg/dL (ref 6–20)
CALCIUM: 8.6 mg/dL — AB (ref 8.9–10.3)
CHLORIDE: 106 mmol/L (ref 101–111)
CO2: 27 mmol/L (ref 22–32)
CREATININE: 1.11 mg/dL (ref 0.61–1.24)
GFR calc non Af Amer: >60 mL/min (ref >60)
GLUCOSE: 111 mg/dL — AB (ref 65–99)
Potassium: 3.6 mmol/L (ref 3.5–5.1)
Sodium: 141 mmol/L (ref 135–145)

## 2015-02-24 LAB — CBC
HCT: 35.7 % — ABNORMAL LOW (ref 39.0–52.0)
Hemoglobin: 11.7 g/dL — ABNORMAL LOW (ref 13.0–17.0)
MCH: 30.5 pg (ref 26.0–34.0)
MCHC: 32.8 g/dL (ref 30.0–36.0)
MCV: 93.2 fL (ref 78.0–100.0)
PLATELETS: 152 10*3/uL (ref 150–400)
RBC: 3.83 MIL/uL — AB (ref 4.22–5.81)
RDW: 15.1 % (ref 11.5–15.5)
WBC: 9 10*3/uL (ref 4.0–10.5)

## 2015-02-24 MED ORDER — MAGNESIUM HYDROXIDE 400 MG/5ML PO SUSP
30.0000 mL | Freq: Every day | ORAL | Status: DC | PRN
  Filled 2015-02-24: qty 30

## 2015-02-24 MED ORDER — ASPIRIN 81 MG PO CHEW
81.0000 mg | CHEWABLE_TABLET | Freq: Every day | ORAL | Status: DC
  Administered 2015-02-24 – 2015-02-25 (×2): 81 mg via ORAL
  Filled 2015-02-24 (×2): qty 1

## 2015-02-24 MED ORDER — ALUM & MAG HYDROXIDE-SIMETH 200-200-20 MG/5ML PO SUSP
30.0000 mL | Freq: Four times a day (QID) | ORAL | Status: DC | PRN
  Administered 2015-02-24: 30 mL via ORAL
  Filled 2015-02-24: qty 30

## 2015-02-24 MED ORDER — SIMETHICONE 80 MG PO CHEW
160.0000 mg | CHEWABLE_TABLET | Freq: Once | ORAL | Status: AC
  Administered 2015-02-24: 160 mg via ORAL
  Filled 2015-02-24: qty 2

## 2015-02-24 NOTE — Progress Notes (Signed)
Subjective:  Overnight, he had a BM and NG tube was pulled. This AM, he reports feeling better and was sitting in a chair while eating breakfast. Nitro gtt was stopped overnight for improved BP.  Objective:  Vital Signs in the last 24 hours: Temp:  [98.4 F (36.9 C)-99.9 F (37.7 C)] 98.7 F (37.1 C) (11/16 0700) Pulse Rate:  [87-142] 96 (11/16 0700) Resp:  [10-26] 14 (11/16 0700) BP: (83-198)/(32-86) 153/58 mmHg (11/16 0700) SpO2:  [87 %-100 %] 94 % (11/16 0700)  Intake/Output from previous day: 11/15 0701 - 11/16 0700 In: 737.6 [P.O.:60; I.V.:547.6; NG/GT:120] Out: 1700 [Urine:1050; Emesis/NG output:650]    Physical Exam: General: elderly Caucasian male, sitting in chair HEENT: PERRL, EOMI, no scleral icterus, oropharynx with clear mucous membranes, no JVD Cardiac: RRR, ?early to mid systolic RUSB  Pulm: clear to auscultation bilaterally Abd: soft, nontender, nondistended, normoactive BS Ext: warm and well perfused, no pedal or tibial edema Neuro: responds to questions appropriately; moving all extremities freely    Lab Results:  Recent Labs  02/23/15 0436 02/24/15 0218  WBC 9.7 9.0  HGB 11.9* 11.7*  PLT 171 152    Recent Labs  02/23/15 0436 02/24/15 0218  NA 137 141  K 3.8 3.6  CL 104 106  CO2 21* 27  GLUCOSE 177* 111*  BUN 16 14  CREATININE 1.07 1.11   No results for input(s): TROPONINI in the last 72 hours.  Invalid input(s): CK, MB  Tele: Occasional PVCs, stable from yesterday.   Assessment/Plan:  Mr. 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   Possible ileus: Resolved this AM. Suspect it may be related to having taken Immodium prior to his hospital  stay and then undergoing procedure. -Encourage mobilization out of bed -Transfer to telemetry  Hypertension: BP trending 150s-170s/40-60s overnight for which nitroglycerin gtt was stopped. Concern for confusion regarding the medications at home as expressed by patient and family. Home medications include Cozaar 100mg , Lopressor 25mg  three times daily, Imdur 60mg , diltiazem 120mg  three times daily, clonidine 0.3mg  twice daily, torsemide 20mg . Prior office visits with this regimen show controlled BP.  -Continue metoprolol 100mg  twice daily, spironolactone 25mg  along with other home meds to assess BP  Chronic paroxysmal atrial fibrillation: Continue Eliquis 5mg  twice daily.   Severe multivessel CAD s/p CABG and DES of native RCA: Continue Plavix, Eliquis, ASA 81mg .   Hyperlipidemia: Continue pravastatin 80mg .  OSA: Wears CPAP machine at home though defer for now.  COPD: Home meds include albuterol, Singulair, Duonebs. -Continue home meds  Charlott Rakes, [facility] Pager: [phone]  02/24/2015, 9:38 AM   Personally seen and examined. Agree with above. 81 year old with complex medical history including uncontrolled/difficult to control hypertension for several years with peripheral vascular disease status post subclavian artery stent to supply better blood flow to LIMA bypass graft who post intervention developed severe nausea and vomiting, NG tube was placed, severe hypertension was noted.  Sheath was pulled successfully yesterday. OG tube is no longer in place. He tolerated breakfast this morning.  In review of Dr. Kennon Holter note, we will continue aspirin, Plavix, Elliquis for one month post subclavian stent, and after one month, we will discontinue his aspirin and continue both Plavix and Eliquis.  No signs of  bleeding.  Unsure what his intermittent shaking is from. Afebrile. Chest x-ray unremarkable. No cough.  In regards to his hypertension starting spironolactone 25 mg  once a day. Watch potassium and creatinine. Increased his metoprolol.  We will transfer to telemetry bed today.  Candee Furbish, MD

## 2015-02-24 NOTE — Care Management Note (Signed)
Case Management Note  Patient Details  Name: [name]. MRN: [id number] Date of Birth: [birth year]  Subjective/Objective:    Adm w subl arter stenosis                Action/Plan: lives w wife, pcp dr [name], no hx of hhc   Expected Discharge Date:                  Expected Discharge Plan:  [place]  In-House Referral:     Discharge planning Services  CM Consult  Post Acute Care Choice:    Choice offered to:  Patient  DME Arranged:    DME Agency:     HH Arranged:  RN [place] Agency:  [facility]  Status of Service:     Medicare Important Message Given:    Date Medicare IM Given:    Medicare IM give by:    Date Additional Medicare IM Given:    Additional Medicare Important Message give by:     If discussed at [place] of Stay Meetings, dates discussed:    Additional Comments:went over hhc agency list and no pref. Ref to [facility] for hhrn. Ref to donna w [facility].  Lacretia Leigh, RN 02/24/2015, 11:19 AM

## 2015-02-24 NOTE — Progress Notes (Signed)
NG tube was removed after patient had a large bowel movement and was able to take PO meds without nausea. Will continue to monitor.

## 2015-02-24 NOTE — Progress Notes (Signed)
Discussed with Dr. Posey Pronto concerns over pt's medication administration at home. Wife manages medications for pt, but states she does not administer it as prescribed. She give him his pills depending on "what she thinks he needs" and what his blood pressure is at that time. She appeared very overwhelmed with having to manage his medications. Care management was consulted for assistance with home health services.

## 2015-02-24 NOTE — Progress Notes (Signed)
After PO medications were given tonight, the patient's BP is now 126/50 and he is off the Nitro drip. MD paged about 10mg  IV metoprolol scheduled for 00:00. MD ordered to hold scheduled metoprolol. Will continue to monitor.

## 2015-02-25 ENCOUNTER — Telehealth: Payer: Self-pay | Admitting: Physician Assistant

## 2015-02-25 LAB — BASIC METABOLIC PANEL
ANION GAP: 8 (ref 5–15)
BUN: 29 mg/dL — AB (ref 6–20)
CHLORIDE: 106 mmol/L (ref 101–111)
CO2: 27 mmol/L (ref 22–32)
Calcium: 8.8 mg/dL — ABNORMAL LOW (ref 8.9–10.3)
Creatinine, Ser: 1.37 mg/dL — ABNORMAL HIGH (ref 0.61–1.24)
GFR calc Af Amer: 54 mL/min — ABNORMAL LOW (ref >60)
GFR, EST NON AFRICAN AMERICAN: 47 mL/min — AB (ref >60)
Glucose, Bld: 127 mg/dL — ABNORMAL HIGH (ref 65–99)
POTASSIUM: 3.5 mmol/L (ref 3.5–5.1)
SODIUM: 141 mmol/L (ref 135–145)

## 2015-02-25 LAB — CBC
HEMATOCRIT: 36.9 % — AB (ref 39.0–52.0)
HEMOGLOBIN: 12.3 g/dL — AB (ref 13.0–17.0)
MCH: 30.9 pg (ref 26.0–34.0)
MCHC: 33.3 g/dL (ref 30.0–36.0)
MCV: 92.7 fL (ref 78.0–100.0)
Platelets: 157 10*3/uL (ref 150–400)
RBC: 3.98 MIL/uL — ABNORMAL LOW (ref 4.22–5.81)
RDW: 14.6 % (ref 11.5–15.5)
WBC: 16.1 10*3/uL — AB (ref 4.0–10.5)

## 2015-02-25 MED ORDER — METOPROLOL TARTRATE 100 MG PO TABS: 100.0000 mg | ORAL_TABLET | Freq: Two times a day (BID) | ORAL | Status: DC

## 2015-02-25 MED ORDER — SPIRONOLACTONE 25 MG PO TABS: 25.0000 mg | ORAL_TABLET | Freq: Every day | ORAL | Status: DC

## 2015-02-25 MED ORDER — ASPIRIN 81 MG PO CHEW: 81.0000 mg | CHEWABLE_TABLET | Freq: Every day | ORAL | Status: DC

## 2015-02-25 NOTE — Care Management Note (Signed)
Case Management Note Previous CM note initiated by Donne Anon RN CM   Patient Details  Name: [name]. MRN: [id number] Date of Birth: 21-Nov-1930  Subjective/Objective:    Adm w subl arter stenosis                Action/Plan: lives w wife, pcp dr [name], no hx of hhc   Expected Discharge Date:     02/25/15             Expected Discharge Plan:  [place]  In-House Referral:     Discharge planning Services  CM Consult  Post Acute Care Choice:  Home Health Choice offered to:  Patient  DME Arranged:    DME Agency:     HH Arranged:  RN [place] Agency:  [place]  Status of Service:  Completed, signed off  Medicare Important Message Given:    Date Medicare IM Given:    Medicare IM give by:    Date Additional Medicare IM Given:    Additional Medicare Important Message give by:     If discussed at [place] of Stay Meetings, dates discussed:    Additional Comments:went over hhc agency list and no pref. Ref to adv homecare for hhrn. Ref to donna w ahc.   02/25/15- Marvetta Gibbons RN, BSN- pt for d/c home today with [facility] [facility]- notified Tiffany with [facility] of pt's discharge with [facility].   Dawayne Patricia, RN 02/25/2015, 2:16 PM

## 2015-02-25 NOTE — Discharge Summary (Signed)
Discharge Summary   Patient ID: Stephen Cong.,  MRN: [id number], DOB/AGE: [birth year] 81 y.o.  Admit date: 02/22/2015 Discharge date: 02/25/2015  Primary Care Provider: Gwendolyn Grant Primary Cardiologist: Dr. Percival Spanish PV: Dr. Gwenlyn Found  Discharge Diagnoses   Subclavian artery stenosis, left   PAD (peripheral artery disease) ([place])   Hyperlipidemia with target LDL less than 100   Coronary atherosclerosis   Obstructive sleep apnea   PAF (paroxysmal atrial fibrillation) (HCC)   Accelerated hypertension   Loose stools/nausea/vomiting   Increased WBC   Possible ileus    COPD  Allergies Allergies  Allergen Reactions  . Ace Inhibitors Other (See Comments)    Severe asthma (COPD)  . Morphine And Related Nausea And Vomiting  . Codeine Nausea And Vomiting  . Lasix [Furosemide] Itching  . Other Other (See Comments)    Maple trees, allergy symptoms    Consultant: None  Procedures  PV angiogram 02/22/2015 Pre Procedure Diagnosis: left subclavian artery stenosis Post Procedure Diagnosis: left subclavian artery stenosis  Operators: Dr. Quay Burow  Procedures Performed: 1. Left subclavian artery PTA and stent  Angiographic Data:   1: Left subclavian artery-80% proximal left subclavian artery stenosis  IMPRESSION: [name] has a high-grade proximal left subclavian artery stenosis, mycin blood flow to his left internal mammary artery. At his previous cath performed by Dr. Burt Knack he had retrograde flow up his LIMA graft from left main injection. We will proceed with PTA and stenting for "LIMA steel".  Final Impression: successful left subclavian artery PTA and stenting for "LIMA Steele using a balloon expandable stent. The patient will be treated with aspirin and Plavix. He'll restart his Eliquis in 2 days. He'll be discharged home in the morning and obtain upper extremity Doppler studies in our [place] office next week. He will see me back in the office  3-4 weeks thereafter after which he will follow-up with Dr. Burt Knack.  History of Present Illness  [name] is a 80 year old ill-appearing married Caucasian male with hx of CAD, s/p CABG, s/p Promus DES to S-PDA in 06/2010, HTN, HLD, PAF on Eliquis, bilateral internal carotid artery stenosis, COPD and OSA who presented 02/22/15 for scheduled PV Angiogram/Intervention for left subclavian artery stenosis.   He has a history of remote coonary artery bypass grafting by Dr. Prescott Gum approximately 16 years ago. He recently had a non-STEMI and underwent cardiac catheterization by Dr. Burt Knack on 11/26/14 revealing a subtotally occluded PDA vein graft. Dr. Burt Knack intervened on the native right coronary artery. He did demonstrate a 75% proximal left subclavian artery stenosis with underfilling of his LIMA graft and with retrograde filling of the LIMA graft with left main injection. The patient does have a differential blood pressures upper extremities and duplex performed 12/17/14 revealed apparent flow in the left vertebral, high-grade left subclavian artery stenosis. He denies subclavian steel work upper extremity claudication although clearly hemodynamically this is a significant lesion proximal to the LIMA graft. He was referred to Dr. Gwenlyn Found and evaluated by him 12/23/14 and flet that he will benefit from left to prevent artery stenting. He wished to delay this until early November. He will stop his Eliquis 2 days prior to intervention.  Hospital Course  He was presented for schedule procedure. S/p successful left subclavian artery PTA and stenting for "LIMA Steele using a balloon expandable stent. Hospital course was complicated by ileus and poorly controlled hypertension post procedure.  Prior history of cholecystectomy and codeine intolerance which may have been worsened his symptoms  when he received morphine IV. No improvement with Zofran IV or Phenergan IV. NG tube placed with bilious output. Luz Brazen, PA  from GI advised (no formal consult) to clamp tube first. Patient tolerated for 10-15 minutes then c/o SOB nausea and chills HR 130. NGT opened with small amount of bowel removed and with relief of nausea. His symptoms improved afterwards. He had BM and NG tube pulled. Suspeceted it may be related to having taken Immodium prior to his hospital stay and then undergoing procedure.  It was felt that there was a confusion regarding the BP medications at home as wife noted he has had persistently elevated BP in 200s for the past 2-3 weeks. Antihypertensive meds were adjusted. Nitro gtt discontinued with improvement of BP.   She has been seen by Dr. Gillian Shields today and deemed ready for discharge home. All follow-up appointments have been scheduled. Discharge medications are listed below.   He was discharged on spironolactone 25 mg once a day (new during this admission), metoprolol 100 mg twice a day (newly increased), Cozaar 100 mg a day,  isosorbide 60 mg a day, clonidine 0.3 mg twice a day for now, and torsemide 20 mg daily.  Stop hydralazine 100 mg 3 times a day and diltiazem 120 mg 2 times a day. Heart rate improved after increasing metoprolol. Mild increase in creatinine from 1.1 up to 1.37 after administration of spironolactone. Recheck BMET in 1 week during TCM appointment.  He was advised to bring all medication to appointment.   - Plan to Continue Aspirin 81 mg, Plavix 75 mg, Eliquis 5 mg twice a day - Stop aspirin after one month, continue Plavix and Eliquis for the remainder of the 11 months and then stop Plavix after one year total post stent placement.  Discharge Vitals Blood pressure 110/41, pulse 81, temperature 99.4 F (37.4 C), temperature source Oral, resp. rate 18, height 5\' 2"  (1.575 m), weight 139 lb (63.05 kg), SpO2 95 %.  Filed Weights   02/22/15 0544 02/24/15 1441 02/25/15 0402  Weight: 146 lb (66.225 kg) 143 lb 12.8 oz (65.227 kg) 139 lb (63.05 kg)    Labs  CBC  Recent Labs   02/24/15 0218 02/25/15 0513  WBC 9.0 16.1*  HGB 11.7* 12.3*  HCT 35.7* 36.9*  MCV 93.2 92.7  PLT 152 [id number]   Basic Metabolic Panel  Recent Labs  02/23/15 0436 02/24/15 0218 02/25/15 0513  NA 137 141 141  K 3.8 3.6 3.5  CL 104 106 106  CO2 21* 27 27  GLUCOSE 177* 111* 127*  BUN 16 14 29*  CREATININE 1.07 1.11 1.37*  CALCIUM 8.8* 8.6* 8.8*  MG 2.0  --   --   PHOS 2.7  --   --    Liver Function Tests  Recent Labs  02/23/15 0436  AST 19  ALT 17  ALKPHOS 61  BILITOT 0.9  PROT 5.8*  ALBUMIN 3.7    Disposition  Pt is being discharged home today in good condition.  Follow-up Plans & Appointments  Follow-up Information    Follow up with [facility].   Why:  they will call after disch to set up appt   Contact information:   [address] [place] [address] [phone]       Follow up with Charlie Pitter, PA-C On 03/02/2015.   Specialties:  Cardiology, Radiology   Why:  @3 :30pm for TCM appoinment - Bring all medication during appoinment   Contact information:  [address] [place] [address] [phone]           Discharge Instructions    Call MD for:  redness, tenderness, or signs of infection (pain, swelling, redness, odor or green/yellow discharge around incision site)    Complete by:  As directed      Diet - low sodium heart healthy    Complete by:  As directed      Discharge instructions    Complete by:  As directed   No driving for 48 hours. No lifting over 5 lbs for 1 week. No sexual activity for 1 week.  Keep procedure site clean & dry. If you notice increased pain, swelling, bleeding or pus, call/return!  You may shower, but no soaking baths/hot tubs/pools for 1 week.     Increase activity slowly    Complete by:  As directed          F/u Labs/Studies: Bmet during post hospital appoinment  Discharge Medications    Medication List    STOP taking these medications        diltiazem  120 MG 24 hr capsule  Commonly known as:  CARDIZEM CD     hydrALAZINE 50 MG tablet  Commonly known as:  APRESOLINE      TAKE these medications        acetaminophen 325 MG tablet  Commonly known as:  TYLENOL  Take 325 mg by mouth every 6 (six) hours as needed for moderate pain or headache.     albuterol 108 (90 BASE) MCG/ACT inhaler  Commonly known as:  PROVENTIL HFA;VENTOLIN HFA  Inhale 2 puffs into the lungs every 6 (six) hours as needed for wheezing or shortness of breath.     apixaban 5 MG Tabs tablet  Commonly known as:  ELIQUIS  Take 1 tablet (5 mg total) by mouth 2 (two) times daily.     aspirin 81 MG chewable tablet  Chew 1 tablet (81 mg total) by mouth daily.     budesonide 0.25 MG/2ML nebulizer solution  Commonly known as:  PULMICORT  Take 0.25 mg by nebulization at bedtime. For asthma     calcium-vitamin D 500-200 MG-UNIT tablet  Commonly known as:  OSCAL WITH D  Take 1 tablet by mouth 2 (two) times daily.     cholestyramine 4 G packet  Commonly known as:  QUESTRAN  take 1 packet by mouth twice a day if needed for LOOSE STOOLS     cloNIDine 0.3 MG tablet  Commonly known as:  CATAPRES  Take 1 tablet (0.3 mg total) by mouth 2 (two) times daily.     clopidogrel 75 MG tablet  Commonly known as:  PLAVIX  Take 1 tablet (75 mg total) by mouth daily with breakfast.     DULERA 100-5 MCG/ACT Aero  Generic drug:  mometasone-formoterol  inhale 2 puffs by mouth once daily (THEN RINSE MOUTH AFTER USE)     ipratropium-albuterol 0.5-2.5 (3) MG/3ML Soln  Commonly known as:  DUONEB  USE ONE VIAL VIA NEBULIZER EVERY 6 HOURS AS NEEDED FOR SHORTNESS OF BREATH     isosorbide mononitrate 60 MG 24 hr tablet  Commonly known as:  IMDUR  Take 1 tablet (60 mg total) by mouth daily.     latanoprost 0.005 % ophthalmic solution  Commonly known as:  XALATAN  Place 1 drop into both eyes at bedtime.     LORazepam 0.5 MG tablet  Commonly known as:  ATIVAN  Take  1 tablet (0.5 mg  total) by mouth 3 (three) times daily.     losartan 100 MG tablet  Commonly known as:  COZAAR  Take 1 tablet (100 mg total) by mouth every evening.     metoprolol 100 MG tablet  Commonly known as:  LOPRESSOR  Take 1 tablet (100 mg total) by mouth 2 (two) times daily.     montelukast 10 MG tablet  Commonly known as:  SINGULAIR  Take 1 tablet (10 mg total) by mouth at bedtime.     nitroGLYCERIN 0.4 MG SL tablet  Commonly known as:  NITROSTAT  Place 1 tablet (0.4 mg total) under the tongue every 5 (five) minutes as needed for chest pain.     NONFORMULARY OR COMPOUNDED ITEM  Allergy Vaccine 1:10 Given at Home     pantoprazole 40 MG tablet  Commonly known as:  PROTONIX  take 1 tablet by mouth twice a day     PHILLIPS COLON HEALTH PO  Take 1 capsule by mouth every evening.     potassium chloride SA 20 MEQ tablet  Commonly known as:  K-DUR,KLOR-CON  Take 1 tablet (20 mEq total) by mouth 2 (two) times daily.     pravastatin 80 MG tablet  Commonly known as:  PRAVACHOL  take 1 tablet by mouth every morning     RA VITAMIN B-12 TR 1000 MCG Tbcr  Generic drug:  Cyanocobalamin  take 1 tablet by mouth once daily     spironolactone 25 MG tablet  Commonly known as:  ALDACTONE  Take 1 tablet (25 mg total) by mouth daily.     torsemide 20 MG tablet  Commonly known as:  DEMADEX  Take 1 tablet (20 mg total) by mouth daily.        Duration of Discharge Encounter   Greater than 30 minutes including physician time.  Jarrett Soho PA-C 02/25/2015, 1:23 PM    81 year old male with complex medical history, severe multivessel coronary disease status post bypass and DES of native RCA, recent subclavian stent with postoperative nausea or vomiting possible minimal ileus now resolved with long-standing uncontrolled hypertension, paroxysmal atrial fibrillation on chronic anticoagulation, hyperlipidemia, sleep apnea, COPD.  Essential hypertension - Much better control here  in the hospital. His family relayed to me that this seems to be the case usually. Nursing staff and other notes relate that there may be some issues with medication adherence at home. His wife may only given his medicines if she feels as though he needs them. I think that we need to cut back on some of his medications and see how he does. - New medication was spironolactone 25 mg once a day. - I'm going to continue his metoprolol 100 mg twice a day (newly increased) - Continue with Cozaar 100 mg a day. - Continue isosorbide 60 mg a day - I'm going to stop hydralazine 100 mg 3 times a day and diltiazem 120 mg 2 times a day. Heart rate improved after increasing metoprolol. - Continue clonidine 0.3 mg twice a day for now. - Continue torsemide 20 mg daily. - Mild increase in creatinine from 1.1 up to 1.37 after administration of spironolactone. - We'll check basic metabolic profile in one week  Subclavian stent - Stable - Aspirin 81 mg, Plavix any 5 mg, Eliquis 5 mg twice a day - Stop aspirin after one month, continue Plavix and Eliquis for the remainder of the 11 months and then stop Plavix after one year total post stent  placement.  Loose stools/nausea/vomiting - Improved. Originally had a G-tube in place. Decompression. Doing much better. Appreciate phone assistance from GI yesterday. - Loose stools partially iatrogenic from laxative as well as increase fruit.  Increased white blood cell count - No evidence of fever - Chest x-ray was unremarkable, no cough, no further shakes of unknown significance - Could be demarginalization, recent stress reaction   He requests to go home today and I think that this is reasonable. Close follow-up within 1 week Dr. Percival Spanish or APP.    Celica Kotowski

## 2015-02-25 NOTE — Telephone Encounter (Signed)
New message      TCM appt scheduled for 03-02-15 with [name]

## 2015-02-25 NOTE — Progress Notes (Signed)
Pt walked around unit with no complications

## 2015-02-25 NOTE — Progress Notes (Signed)
Subjective:  I'm ready to go home. Had a bout of loose stools last night (ate peaches, got laxative earlier). No further shakes. AF. WBC up from prior.   Objective:  Vital Signs in the last 24 hours: Temp:  [98.4 F (36.9 C)-99.4 F (37.4 C)] 99.4 F (37.4 C) (11/17 0402) Pulse Rate:  [74-110] 81 (11/17 0402) Resp:  [15-21] 18 (11/17 0402) BP: (110-179)/(41-67) 110/41 mmHg (11/17 0402) SpO2:  [90 %-99 %] 95 % (11/17 0402) Weight:  [139 lb (63.05 kg)-143 lb 12.8 oz (65.227 kg)] 139 lb (63.05 kg) (11/17 0402)  Intake/Output from previous day: 11/16 0701 - 11/17 0700 In: 240 [P.O.:240] Out: 30 [Emesis/NG output:30]   Physical Exam: General: Well developed, well nourished, in no acute distress. Head:  Normocephalic and atraumatic. Lungs: Clear to auscultation and percussion. Heart: Normal S1 and S2.  No murmur, rubs or gallops.  Abdomen: soft, non-tender, positive bowel sounds. Extremities: No clubbing or cyanosis. No edema. Neurologic: Alert and oriented x 3.    Lab Results:  Recent Labs  02/24/15 0218 02/25/15 0513  WBC 9.0 16.1*  HGB 11.7* 12.3*  PLT 152 157    Recent Labs  02/24/15 0218 02/25/15 0513  NA 141 141  K 3.6 3.5  CL 106 106  CO2 27 27  GLUCOSE 111* 127*  BUN 14 29*  CREATININE 1.11 1.37*   No results for input(s): TROPONINI in the last 72 hours.  Invalid input(s): CK, MB Hepatic Function Panel  Recent Labs  02/23/15 0436  PROT 5.8*  ALBUMIN 3.7  AST 19  ALT 17  ALKPHOS 61  BILITOT 0.9  BILIDIR 0.2  IBILI 0.7   Telemetry: Rare PAC Personally viewed.    Cardiac Studies:  Left subclavian stent  Assessment/Plan:  Active Problems:   Hyperlipidemia with target LDL less than 100   Coronary atherosclerosis   Obstructive sleep apnea   PAF (paroxysmal atrial fibrillation) (HCC)   Accelerated hypertension   Subclavian artery stenosis, left   PAD (peripheral artery disease) ([place])   81 year old male with complex medical  history, severe multivessel coronary disease status post bypass and DES of native RCA, recent subclavian stent with postoperative nausea or vomiting possible minimal ileus now resolved with long-standing uncontrolled hypertension, paroxysmal atrial fibrillation on chronic anticoagulation, hyperlipidemia, sleep apnea, COPD.  Essential hypertension  - Much better control here in the hospital. His family relayed to me that this seems to be the case usually. Nursing staff and other notes relate that there may be some issues with medication adherence at home. His wife may only given his medicines if she feels as though he needs them. I think that we need to cut back on some of his medications and see how he does.  - New medication was spironolactone 25 mg once a day.  - I'm going to continue his metoprolol 100 mg twice a day (newly increased)  - Continue with Cozaar 100 mg a day.  - Continue isosorbide 60 mg a day  - I'm going to stop hydralazine 100 mg 3 times a day and diltiazem 120 mg 2 times a day. Heart rate improved after increasing metoprolol.  - Continue clonidine 0.3 mg twice a day for now.  - Continue torsemide 20 mg daily.  - Mild increase in creatinine from 1.1 up to 1.37 after administration of spironolactone.  - We'll check basic metabolic profile in one week  Subclavian stent  - Stable  - Aspirin 81 mg, Plavix any 5 mg,  Eliquis 5 mg twice a day  - Stop aspirin after one month, continue Plavix and Eliquis for the remainder of the 11 months and then stop Plavix after one year total post stent placement.  Loose stools/nausea/vomiting  - Improved. Originally had a G-tube in place. Decompression. Doing much better. Appreciate phone assistance from GI yesterday.  - Loose stools partially iatrogenic from laxative as well as increase fruit.  Increased white blood cell count  - No evidence of fever  - Chest x-ray was unremarkable, no cough, no further shakes of unknown significance  -  Could be demarginalization, recent stress reaction   He requests to go home today and I think that this is reasonable. Close follow-up within 1 week Dr. Percival Spanish or APP.    Janee Ureste, [place] 02/25/2015, 11:24 AM

## 2015-02-26 DIAGNOSIS — I2510 Atherosclerotic heart disease of native coronary artery without angina pectoris: Secondary | ICD-10-CM | POA: Diagnosis not present

## 2015-02-26 DIAGNOSIS — I10 Essential (primary) hypertension: Secondary | ICD-10-CM | POA: Diagnosis not present

## 2015-02-26 DIAGNOSIS — I480 Paroxysmal atrial fibrillation: Secondary | ICD-10-CM | POA: Diagnosis not present

## 2015-02-26 DIAGNOSIS — I771 Stricture of artery: Secondary | ICD-10-CM | POA: Diagnosis not present

## 2015-02-26 NOTE — Telephone Encounter (Signed)
Patient contacted regarding discharge from [facility] on 02/25/15.  Patient understands to follow up with provider J Hochrein on Tuesday 11/22 at 2 pm at [facility]. Patient understands discharge instructions? yes Patient understands medications and regiment? yes Patient understands to bring all medications to this visit? Yes  Pt denies any questions.  Wife going today to pick up meds since he didn't get home until after 6 last night.  States he feels about the same today as he did in the hospital.  Instructed pt to call with any questions or concerns prior to his appt on Tuesday.

## 2015-03-01 ENCOUNTER — Telehealth: Payer: Self-pay | Admitting: Cardiology

## 2015-03-01 NOTE — Telephone Encounter (Signed)
[name] called in stating that she needs his most recent EJ from his Echo and she will need confirmation of a CHF diagnosis faxed to [phone]. Thanks

## 2015-03-01 NOTE — Telephone Encounter (Signed)
Faxed last echo result and office notes to HF management.

## 2015-03-02 ENCOUNTER — Encounter: Payer: Self-pay | Admitting: Cardiology

## 2015-03-02 ENCOUNTER — Encounter: Payer: Medicare Other | Admitting: Physician Assistant

## 2015-03-02 ENCOUNTER — Ambulatory Visit (INDEPENDENT_AMBULATORY_CARE_PROVIDER_SITE_OTHER): Payer: Medicare Other | Admitting: Cardiology

## 2015-03-02 VITALS — BP 160/82 | HR 99 | Ht 62.0 in | Wt 141.9 lb

## 2015-03-02 DIAGNOSIS — R109 Unspecified abdominal pain: Secondary | ICD-10-CM

## 2015-03-02 DIAGNOSIS — I480 Paroxysmal atrial fibrillation: Secondary | ICD-10-CM

## 2015-03-02 MED ORDER — DILTIAZEM HCL ER COATED BEADS 120 MG PO CP24: 120.0000 mg | ORAL_CAPSULE | Freq: Every day | ORAL | Status: DC

## 2015-03-02 NOTE — Progress Notes (Signed)
HPI The patient presents for follow up of  CAD, s/p CABG, s/p Promus DES to S-PDA in 06/2010.  He was hospitalized again recently with hypertensive urgency and NSTEMI.  He had subtotal stenosis of the vein graft to posterior lateral off the right coronary. He had stenting of the native right coronary.  The patient also was found to have subclavian stenosis.   He has since been admitted for PCI of the subclavian.  He did have medication changes to include the addition of spiral lactone and increased metoprolol. Diltiazem and hydralazine were stopped.  Of note he did have poorly controlled hypertension which was why the meds were changed. He had an ileus postprocedure as well.  He is now returning and is on aspirin and Plavix and Eliquis  Since I last saw him he's continued to not feel well with diarrhea. He's lost about 8 pounds with all these gone through. He still does get some chest discomfort. However, this seems to be an epigastric discomfort. He has not gone away in the past with nitroglycerin. It does go away with Tums and Rolaids and he's getting a little bit better with Mylanta. He's had no new shortness of breath, PND or orthopnea. He's had no weight gain or edema.   Allergies  Allergen Reactions  . Ace Inhibitors Other (See Comments)    Severe asthma (COPD)  . Morphine And Related Nausea And Vomiting  . Codeine Nausea And Vomiting  . Lasix [Furosemide] Itching  . Other Other (See Comments)    Maple trees, allergy symptoms    Current Outpatient Prescriptions  Medication Sig Dispense Refill  . acetaminophen (TYLENOL) 325 MG tablet Take 325 mg by mouth every 6 (six) hours as needed for moderate pain or headache.    . albuterol (PROVENTIL HFA;VENTOLIN HFA) 108 (90 BASE) MCG/ACT inhaler Inhale 2 puffs into the lungs every 6 (six) hours as needed for wheezing or shortness of breath. 8 g 5  . apixaban (ELIQUIS) 5 MG TABS tablet Take 1 tablet (5 mg total) by mouth 2 (two) times daily. 60  tablet 5  . aspirin 81 MG chewable tablet Chew 1 tablet (81 mg total) by mouth daily.    . budesonide (PULMICORT) 0.25 MG/2ML nebulizer solution Take 0.25 mg by nebulization at bedtime. For asthma    . calcium-vitamin D (OSCAL WITH D) 500-200 MG-UNIT per tablet Take 1 tablet by mouth 2 (two) times daily.    . cholestyramine (QUESTRAN) 4 G packet take 1 packet by mouth twice a day if needed for LOOSE STOOLS (Patient taking differently: Take 1 packet by mouth daily) 60 each 2  . cloNIDine (CATAPRES) 0.3 MG tablet Take 1 tablet (0.3 mg total) by mouth 2 (two) times daily. 60 tablet 0  . clopidogrel (PLAVIX) 75 MG tablet Take 1 tablet (75 mg total) by mouth daily with breakfast. 30 tablet 03  . DULERA 100-5 MCG/ACT AERO inhale 2 puffs by mouth once daily (THEN RINSE MOUTH AFTER USE) 17.6 g 5  . ipratropium-albuterol (DUONEB) 0.5-2.5 (3) MG/3ML SOLN USE ONE VIAL VIA NEBULIZER EVERY 6 HOURS AS NEEDED FOR SHORTNESS OF BREATH 360 mL 5  . isosorbide mononitrate (IMDUR) 60 MG 24 hr tablet Take 1 tablet (60 mg total) by mouth daily. 90 tablet 3  . latanoprost (XALATAN) 0.005 % ophthalmic solution Place 1 drop into both eyes at bedtime.     Marland Kitchen LORazepam (ATIVAN) 0.5 MG tablet Take 1 tablet (0.5 mg total) by mouth 3 (three) times daily.  90 tablet 5  . losartan (COZAAR) 100 MG tablet Take 1 tablet (100 mg total) by mouth every evening. 90 tablet 3  . metoprolol tartrate (LOPRESSOR) 100 MG tablet Take 1 tablet (100 mg total) by mouth 2 (two) times daily. 60 tablet 11  . montelukast (SINGULAIR) 10 MG tablet Take 1 tablet (10 mg total) by mouth at bedtime. 90 tablet 3  . nitroGLYCERIN (NITROSTAT) 0.4 MG SL tablet Place 1 tablet (0.4 mg total) under the tongue every 5 (five) minutes as needed for chest pain. 25 tablet 0  . NONFORMULARY OR COMPOUNDED ITEM Allergy Vaccine 1:10 Given at Home    . pantoprazole (PROTONIX) 40 MG tablet take 1 tablet by mouth twice a day (Patient taking differently: take 40 mg by mouth  twice a day) 60 tablet 11  . potassium chloride SA (K-DUR,KLOR-CON) 20 MEQ tablet Take 1 tablet (20 mEq total) by mouth 2 (two) times daily. 60 tablet 6  . pravastatin (PRAVACHOL) 80 MG tablet take 1 tablet by mouth every morning (Patient taking differently: take 80 mg by mouth every morning) 30 tablet 2  . Probiotic Product (PHILLIPS COLON HEALTH PO) Take 1 capsule by mouth every evening.     Marland Kitchen RA VITAMIN B-12 TR 1000 MCG TBCR take 1 tablet by mouth once daily (Patient taking differently: take 1000 mcg by mouth once daily) 30 tablet 11  . spironolactone (ALDACTONE) 25 MG tablet Take 1 tablet (25 mg total) by mouth daily. 30 tablet 11  . torsemide (DEMADEX) 20 MG tablet Take 1 tablet (20 mg total) by mouth daily. 30 tablet 0  . diltiazem (CARDIZEM CD) 120 MG 24 hr capsule Take 1 capsule by mouth 2 (two) times daily.  0   No current facility-administered medications for this visit.    Past Medical History  Diagnosis Date  . CORONARY HEART DISEASE     a. s/p CABG;  b. cath 06/27/10: S-Dx occluded, S-PDA 80-90% (tx with PCI); S-OM ok, L-LAD ok;  EF 65-70%  c.  s/p Promus DES to S-PDA 06/2010;   d. Cath 8 2016 LIMA to the LAD patent, SVG to posterior lateral subtotal, SVG to diagonal occluded chronically. The patient had stenting of the native right vessel.  Marland Kitchen FIBRILLATION, ATRIAL   . SLEEP APNEA 09/2001    NPSG AHI 22/HR  . ASTHMA   . Depression   . GERD     with HH, hx esophageal stricture  . DYSLIPIDEMIA   . HYPERTENSION     Echo 3/12: EF 55-60%; mod LVH; mild AS/AI; LAE; PASP 38; mild pulmo HTN  . Personal history of alcoholism ([place])   . Diverticulosis   . Benign liver cyst   . Skin cancer     L forearm  . Cataract     surgery to both eyes  . Esophageal stricture   . Hiatal hernia   . Pneumonia 04/2013 hosp  . Anxiety   . Arthritis   . CHF (congestive heart failure) ([place])   . COPD (chronic obstructive pulmonary disease) ([place])   . Glaucoma   . Osteoporosis   . Subclavian arterial  stenosis [facility])     Past Surgical History  Procedure Laterality Date  . Coronary artery bypass graft  02/2007  . Tonsillectomy    . Coronary angioplasty with stent placement  07/2010  . Cataract extraction, bilateral  2007  . Hernia repair  unsure ?60's  . Cholecystectomy  02/21/2011    Procedure: LAPAROSCOPIC CHOLECYSTECTOMY WITH INTRAOPERATIVE CHOLANGIOGRAM;  Surgeon: Pedro Earls, MD;  Location: [facility];  Service: General;  Laterality: N/A;  . Cardiac catheterization N/A 11/26/2014    Procedure: Left Heart Cath and Cors/Grafts Angiography;  Surgeon: Sherren Mocha, MD;  Location: [place] [facility];  Service: Cardiovascular;  Laterality: N/A;  . Cardiac catheterization N/A 11/26/2014    Procedure: Coronary Stent Intervention;  Surgeon: Sherren Mocha, MD;  Location: [place] [facility];  Service: Cardiovascular;  Laterality: N/A;  . Peripheral vascular catheterization N/A 02/22/2015    Procedure: Upper Extremity Angiography;  Surgeon: Lorretta Harp, MD;  Location: [place] [facility];  Service: Cardiovascular;  Laterality: N/A;    ROS:  As stated in the HPI and negative for all other systems.  PHYSICAL EXAM BP 160/82 mmHg  Pulse 99  Ht 5\' 2"  (1.575 m)  Wt 141 lb 14.4 oz (64.365 kg)  BMI 25.95 kg/m2 GENERAL:  Chronically ill appearing but in no acute distress HEENT:  Pupils equal round and reactive, fundi not visualized, oral mucosa unremarkable, dentures NECK:  No jugular venous distention, waveform within normal limits, carotid upstroke brisk and symmetric, left carotid bruits, no thyromegaly LUNGS:  Clear to auscultation bilaterally CHEST:  Well healed sternotomy scar. HEART:  PMI not displaced or sustained,S1 and S2 within normal limits, no S3, no S4, no clicks, no rubs, soft apical, right upper sterna border early peaking murmur ABD:  Flat, positive bowel sounds normal in frequency in pitch, no bruits, no rebound, no guarding, no midline pulsatile mass, no hepatomegaly, no  splenomegaly, healed abdominal scars. EXT:  2 plus pulses throughout, mild bilateral edema, no cyanosis no clubbing, right bruit, right ecchymosis with slight hematoma but no pulsatile mass SKIN:  No nodules  EKG:  Sinus rhythm, rate 99, axis within normal limits, intervals within normal limits, poor anterior R wave progression  .03/02/2015  ASSESSMENT AND PLAN  CAD:  The patient has no new sypmtoms.  No further cardiovascular testing is indicated.  We will continue with aggressive risk reduction and meds as listed.  Of note she will be stopping his aspirin on the 14th of next month and continuing the Plavix and Eliquis for an ill-defined period.  I will look at this again in 3 months and decide whether I want to continue it beyond that.  ATRIAL FIBRILLATION:    He has no Paroxysmal symptoms.  He will continue with Eliqus as above  HTN: His blood pressure is elevated. I will add Cardizem 120 mg daily. I reviewed his blood pressure diary.  CAROTID STENOSIS:  I reviewed the results from last year he had 40-59% bilateral stenosis.  I will follow-up in about one year.  AS:  This was moderate on echo earlier this year. We will follow this clinically.  DIARRHEA:  I will check a sample of stool for C. Difficile.

## 2015-03-02 NOTE — Patient Instructions (Addendum)
Your physician recommends that you schedule a follow-up appointment in: [place] has recommended you make the following change in your medication: START Cardizem 120 mg daily and STOP Aspirin in 2 weeks  Your physician recommends that you return for lab work in: [place]!!!!

## 2015-03-03 DIAGNOSIS — I2510 Atherosclerotic heart disease of native coronary artery without angina pectoris: Secondary | ICD-10-CM | POA: Diagnosis not present

## 2015-03-03 DIAGNOSIS — I480 Paroxysmal atrial fibrillation: Secondary | ICD-10-CM | POA: Diagnosis not present

## 2015-03-03 DIAGNOSIS — I771 Stricture of artery: Secondary | ICD-10-CM | POA: Diagnosis not present

## 2015-03-03 DIAGNOSIS — I10 Essential (primary) hypertension: Secondary | ICD-10-CM | POA: Diagnosis not present

## 2015-03-05 ENCOUNTER — Telehealth: Payer: Self-pay | Admitting: Nurse Practitioner

## 2015-03-05 DIAGNOSIS — I771 Stricture of artery: Secondary | ICD-10-CM | POA: Diagnosis not present

## 2015-03-05 DIAGNOSIS — I2510 Atherosclerotic heart disease of native coronary artery without angina pectoris: Secondary | ICD-10-CM | POA: Diagnosis not present

## 2015-03-05 DIAGNOSIS — I480 Paroxysmal atrial fibrillation: Secondary | ICD-10-CM | POA: Diagnosis not present

## 2015-03-05 DIAGNOSIS — I10 Essential (primary) hypertension: Secondary | ICD-10-CM | POA: Diagnosis not present

## 2015-03-05 NOTE — Telephone Encounter (Signed)
Phone call from [place] today.  Patient's BP 205/66 before taking medicines. Down to 210/70 about 15 minutes after medicines but while talking with me on the phone - had come down to 122/65.   Will continue to monitor. No change with current medical regimen for now.   Burtis Junes, RN, [facility] [address] [place], [place]  [address] [phone]

## 2015-03-08 ENCOUNTER — Other Ambulatory Visit: Payer: Self-pay | Admitting: Cardiology

## 2015-03-08 DIAGNOSIS — R109 Unspecified abdominal pain: Secondary | ICD-10-CM | POA: Diagnosis not present

## 2015-03-09 DIAGNOSIS — I2510 Atherosclerotic heart disease of native coronary artery without angina pectoris: Secondary | ICD-10-CM | POA: Diagnosis not present

## 2015-03-09 DIAGNOSIS — I10 Essential (primary) hypertension: Secondary | ICD-10-CM | POA: Diagnosis not present

## 2015-03-09 DIAGNOSIS — I480 Paroxysmal atrial fibrillation: Secondary | ICD-10-CM | POA: Diagnosis not present

## 2015-03-09 DIAGNOSIS — I771 Stricture of artery: Secondary | ICD-10-CM | POA: Diagnosis not present

## 2015-03-09 LAB — C. DIFFICILE GDH AND TOXIN A/B
C. DIFF TOXIN A/B: NOT DETECTED
C. difficile GDH: NOT DETECTED

## 2015-03-12 ENCOUNTER — Telehealth: Payer: Self-pay | Admitting: Cardiology

## 2015-03-12 DIAGNOSIS — I480 Paroxysmal atrial fibrillation: Secondary | ICD-10-CM | POA: Diagnosis not present

## 2015-03-12 DIAGNOSIS — I10 Essential (primary) hypertension: Secondary | ICD-10-CM | POA: Diagnosis not present

## 2015-03-12 DIAGNOSIS — I771 Stricture of artery: Secondary | ICD-10-CM | POA: Diagnosis not present

## 2015-03-12 DIAGNOSIS — I2510 Atherosclerotic heart disease of native coronary artery without angina pectoris: Secondary | ICD-10-CM | POA: Diagnosis not present

## 2015-03-12 NOTE — Telephone Encounter (Signed)
Mrs. Rowlette states they have not received patient's test results and he is still having problems.

## 2015-03-12 NOTE — Telephone Encounter (Signed)
Results on C Diff culture communicated to pt. Instructed to f/u w/ PCP.  Understanding verbalized.  Wife informs me BPs which had been fluctuating last week are much improved. I advised to call if further concerns.

## 2015-03-19 DIAGNOSIS — I771 Stricture of artery: Secondary | ICD-10-CM | POA: Diagnosis not present

## 2015-03-19 DIAGNOSIS — I480 Paroxysmal atrial fibrillation: Secondary | ICD-10-CM | POA: Diagnosis not present

## 2015-03-19 DIAGNOSIS — I10 Essential (primary) hypertension: Secondary | ICD-10-CM | POA: Diagnosis not present

## 2015-03-19 DIAGNOSIS — I2510 Atherosclerotic heart disease of native coronary artery without angina pectoris: Secondary | ICD-10-CM | POA: Diagnosis not present

## 2015-03-22 ENCOUNTER — Other Ambulatory Visit: Payer: Self-pay | Admitting: Cardiology

## 2015-03-22 NOTE — Telephone Encounter (Signed)
Rx request sent to pharmacy.  

## 2015-03-30 ENCOUNTER — Ambulatory Visit (INDEPENDENT_AMBULATORY_CARE_PROVIDER_SITE_OTHER): Payer: Medicare Other | Admitting: Cardiology

## 2015-03-30 ENCOUNTER — Encounter: Payer: Self-pay | Admitting: Cardiology

## 2015-03-30 VITALS — BP 124/64 | HR 70 | Ht 62.0 in | Wt 143.5 lb

## 2015-03-30 DIAGNOSIS — Z79899 Other long term (current) drug therapy: Secondary | ICD-10-CM

## 2015-03-30 DIAGNOSIS — R197 Diarrhea, unspecified: Secondary | ICD-10-CM | POA: Diagnosis not present

## 2015-03-30 NOTE — Progress Notes (Signed)
HPI The patient presents for follow up of  CAD, s/p CABG, s/p Promus DES to S-PDA in 06/2010.  He was hospitalized again recently with hypertensive urgency and NSTEMI.  He had subtotal stenosis of the vein graft to posterior lateral off the right coronary. He had stenting of the native right coronary.  The patient also was found to have subclavian stenosis.   He has since been admitted for PCI of the subclavian.  He did have medication changes to include the addition of spironolactone and increased metoprolol.  He had an ileus postprocedure as well.  He is now returning and is on aspirin and Plavix and Eliquis.  His ASA was stopped at the last visit.  I did send  C diff stool study because he was having continuing diarrhea.  This came back negative  Studies continue to have some frequent loose stools but it is improved. He otherwise feels much better than he did. He's not having any palpitations, presyncope or syncope. He's not having any PND or orthopnea. He's not having any chest pressure, neck or arm discomfort.  Allergies  Allergen Reactions  . Ace Inhibitors Other (See Comments)    Severe asthma (COPD)  . Morphine And Related Nausea And Vomiting  . Codeine Nausea And Vomiting  . Lasix [Furosemide] Itching  . Other Other (See Comments)    Maple trees, allergy symptoms    Current Outpatient Prescriptions  Medication Sig Dispense Refill  . acetaminophen (TYLENOL) 325 MG tablet Take 325 mg by mouth every 6 (six) hours as needed for moderate pain or headache.    . albuterol (PROVENTIL HFA;VENTOLIN HFA) 108 (90 BASE) MCG/ACT inhaler Inhale 2 puffs into the lungs every 6 (six) hours as needed for wheezing or shortness of breath. 8 g 5  . apixaban (ELIQUIS) 5 MG TABS tablet Take 1 tablet (5 mg total) by mouth 2 (two) times daily. 60 tablet 5  . budesonide (PULMICORT) 0.25 MG/2ML nebulizer solution Take 0.25 mg by nebulization at bedtime. For asthma    . calcium-vitamin D (OSCAL WITH D) 500-200  MG-UNIT per tablet Take 1 tablet by mouth 2 (two) times daily.    . cholestyramine (QUESTRAN) 4 G packet take 1 packet by mouth twice a day if needed for LOOSE STOOLS (Patient taking differently: Take 1 packet by mouth daily) 60 each 2  . cloNIDine (CATAPRES) 0.3 MG tablet Take 1 tablet (0.3 mg total) by mouth 2 (two) times daily. 60 tablet 0  . clopidogrel (PLAVIX) 75 MG tablet Take 1 tablet (75 mg total) by mouth daily with breakfast. 30 tablet 03  . DULERA 100-5 MCG/ACT AERO inhale 2 puffs by mouth once daily (THEN RINSE MOUTH AFTER USE) 17.6 g 5  . ipratropium-albuterol (DUONEB) 0.5-2.5 (3) MG/3ML SOLN USE ONE VIAL VIA NEBULIZER EVERY 6 HOURS AS NEEDED FOR SHORTNESS OF BREATH 360 mL 5  . isosorbide mononitrate (IMDUR) 60 MG 24 hr tablet Take 1 tablet (60 mg total) by mouth daily. 90 tablet 3  . latanoprost (XALATAN) 0.005 % ophthalmic solution Place 1 drop into both eyes at bedtime.     Marland Kitchen LORazepam (ATIVAN) 0.5 MG tablet Take 1 tablet (0.5 mg total) by mouth 3 (three) times daily. 90 tablet 5  . losartan (COZAAR) 100 MG tablet Take 1 tablet (100 mg total) by mouth every evening. 90 tablet 3  . metoprolol tartrate (LOPRESSOR) 100 MG tablet Take 1 tablet (100 mg total) by mouth 2 (two) times daily. 60 tablet 11  .  montelukast (SINGULAIR) 10 MG tablet Take 1 tablet (10 mg total) by mouth at bedtime. 90 tablet 3  . nitroGLYCERIN (NITROSTAT) 0.4 MG SL tablet Place 1 tablet (0.4 mg total) under the tongue every 5 (five) minutes as needed for chest pain. 25 tablet 0  . NONFORMULARY OR COMPOUNDED ITEM Allergy Vaccine 1:10 Given at Home    . pantoprazole (PROTONIX) 40 MG tablet take 1 tablet by mouth twice a day (Patient taking differently: take 40 mg by mouth twice a day) 60 tablet 11  . potassium chloride SA (K-DUR,KLOR-CON) 20 MEQ tablet Take 1 tablet (20 mEq total) by mouth 2 (two) times daily. 60 tablet 6  . pravastatin (PRAVACHOL) 80 MG tablet take 1 tablet by mouth every morning 30 tablet 0  .  Probiotic Product (PHILLIPS COLON HEALTH PO) Take 1 capsule by mouth every evening.     Marland Kitchen RA VITAMIN B-12 TR 1000 MCG TBCR take 1 tablet by mouth once daily (Patient taking differently: take 1000 mcg by mouth once daily) 30 tablet 11  . spironolactone (ALDACTONE) 25 MG tablet Take 1 tablet (25 mg total) by mouth daily. 30 tablet 11   No current facility-administered medications for this visit.    Past Medical History  Diagnosis Date  . CORONARY HEART DISEASE     a. s/p CABG;  b. cath 06/27/10: S-Dx occluded, S-PDA 80-90% (tx with PCI); S-OM ok, L-LAD ok;  EF 65-70%  c.  s/p Promus DES to S-PDA 06/2010;   d. Cath 8 2016 LIMA to the LAD patent, SVG to posterior lateral subtotal, SVG to diagonal occluded chronically. The patient had stenting of the native right vessel.  Marland Kitchen FIBRILLATION, ATRIAL   . SLEEP APNEA 09/2001    NPSG AHI 22/HR  . ASTHMA   . Depression   . GERD     with HH, hx esophageal stricture  . DYSLIPIDEMIA   . HYPERTENSION     Echo 3/12: EF 55-60%; mod LVH; mild AS/AI; LAE; PASP 38; mild pulmo HTN  . Personal history of alcoholism ([place])   . Diverticulosis   . Benign liver cyst   . Skin cancer     L forearm  . Cataract     surgery to both eyes  . Esophageal stricture   . Hiatal hernia   . Pneumonia 04/2013 hosp  . Anxiety   . Arthritis   . CHF (congestive heart failure) ([place])   . COPD (chronic obstructive pulmonary disease) ([place])   . Glaucoma   . Osteoporosis   . Subclavian arterial stenosis [facility])     Past Surgical History  Procedure Laterality Date  . Coronary artery bypass graft  02/2007  . Tonsillectomy    . Coronary angioplasty with stent placement  07/2010  . Cataract extraction, bilateral  2007  . Hernia repair  unsure ?60's  . Cholecystectomy  02/21/2011    Procedure: LAPAROSCOPIC CHOLECYSTECTOMY WITH INTRAOPERATIVE CHOLANGIOGRAM;  Surgeon: Pedro Earls, MD;  [facility];  Service: General;  Laterality: N/A;  . Cardiac catheterization N/A 11/26/2014     Procedure: Left Heart Cath and Cors/Grafts Angiography;  Surgeon: Sherren Mocha, MD;  Location: [place] [facility];  Service: Cardiovascular;  Laterality: N/A;  . Cardiac catheterization N/A 11/26/2014    Procedure: Coronary Stent Intervention;  Surgeon: Sherren Mocha, MD;  Location: [place] [facility];  Service: Cardiovascular;  Laterality: N/A;  . Peripheral vascular catheterization N/A 02/22/2015    Procedure: Upper Extremity Angiography;  Surgeon: Lorretta Harp, MD;  Location: [place] [facility];  Service: Cardiovascular;  Laterality: N/A;    ROS:  As stated in the HPI and negative for all other systems.  PHYSICAL EXAM BP 124/64 mmHg  Pulse 70  Ht 5\' 2"  (1.575 m)  Wt 143 lb 8 oz (65.091 kg)  BMI 26.24 kg/m2  SpO2 95% GENERAL:  Chronically ill appearing but in no acute distress HEENT:  Pupils equal round and reactive, fundi not visualized, oral mucosa unremarkable, dentures NECK:  No jugular venous distention, waveform within normal limits, carotid upstroke brisk and symmetric, left carotid bruits, no thyromegaly LUNGS:  Clear to auscultation bilaterally CHEST:  Well healed sternotomy scar. HEART:  PMI not displaced or sustained,S1 and S2 within normal limits, no S3, no S4, no clicks, no rubs, soft apical, right upper sterna border early peaking murmur ABD:  Flat, positive bowel sounds normal in frequency in pitch, no bruits, no rebound, no guarding, no midline pulsatile mass, no hepatomegaly, no splenomegaly, healed abdominal scars. EXT:  2 plus pulses throughout, mild bilateral edema, no cyanosis no clubbing, right bruit, right ecchymosis with slight hematoma but no pulsatile mass SKIN:  No nodules  EKG:  Sinus rhythm, rate 99, axis within normal limits, intervals within normal limits, poor anterior R wave progression  .03/30/2015  ASSESSMENT AND PLAN  CAD:  The patient has no new sypmtoms.  No further cardiovascular testing is indicated.  We will continue with aggressive  risk reduction and meds as listed.  For now he will continue Plavix and Eliquis.  I will look at this again in 3 months and decide whether I want to continue it beyond that.  ATRIAL FIBRILLATION:    He has no Paroxysmal symptoms.  He will continue with Eliqus as above  HTN: His blood pressure was elevated. I added Cardizem 120 mg daily.  His blood pressure is now much better no change in therapy is indicated.  CAROTID STENOSIS:  I reviewed the results from last year he had 40-59% bilateral stenosis.  I will follow-up in about one year.  AS:  This was moderate on echo earlier this year. We will follow this clinically.  DIARRHEA:  I will send him to Dr. Henrene Pastor for further evaluation of this.

## 2015-03-30 NOTE — Patient Instructions (Signed)
Your physician recommends that you schedule a follow-up appointment in: 2 Months  Your physician recommends that you return for lab work in: [name] have been referred to Dr Henrene Pastor, GI

## 2015-03-31 LAB — COMPLETE METABOLIC PANEL WITH GFR
ALBUMIN: 4 g/dL (ref 3.6–5.1)
ALK PHOS: 63 U/L (ref 40–115)
ALT: 16 U/L (ref 9–46)
AST: 20 U/L (ref 10–35)
BUN: 24 mg/dL (ref 7–25)
CO2: 25 mmol/L (ref 20–31)
Calcium: 9.2 mg/dL (ref 8.6–10.3)
Chloride: 103 mmol/L (ref 98–110)
Creat: 1.1 mg/dL (ref 0.70–1.11)
GFR, Est African American: 72 mL/min (ref >=60)
GFR, Est Non African American: 62 mL/min (ref >=60)
GLUCOSE: 107 mg/dL — AB (ref 65–99)
POTASSIUM: 4.6 mmol/L (ref 3.5–5.3)
SODIUM: 132 mmol/L — AB (ref 135–146)
TOTAL PROTEIN: 6.6 g/dL (ref 6.1–8.1)
Total Bilirubin: 0.5 mg/dL (ref 0.2–1.2)

## 2015-04-02 ENCOUNTER — Telehealth: Payer: Self-pay | Admitting: *Deleted

## 2015-04-02 ENCOUNTER — Other Ambulatory Visit: Payer: Self-pay | Admitting: Internal Medicine

## 2015-04-02 DIAGNOSIS — Z79899 Other long term (current) drug therapy: Secondary | ICD-10-CM

## 2015-04-02 NOTE — Telephone Encounter (Signed)
-----   Message from Minus Breeding, MD sent at 04/01/2015  9:58 AM EST ----- Na is slightly low.  Please repeat a BMET in one month.  Call Mr. Ence with the results and send results to Gwendolyn Grant, MD

## 2015-04-02 NOTE — Telephone Encounter (Signed)
Spoke with pt wife about his blood work, she voice understanding  Labs order to get done in 1 month  Result send to pt PCP via epic

## 2015-04-13 ENCOUNTER — Telehealth: Payer: Self-pay | Admitting: Internal Medicine

## 2015-04-13 ENCOUNTER — Ambulatory Visit: Payer: Medicare Other | Admitting: Internal Medicine

## 2015-04-13 ENCOUNTER — Ambulatory Visit (INDEPENDENT_AMBULATORY_CARE_PROVIDER_SITE_OTHER)
Admission: RE | Admit: 2015-04-13 | Discharge: 2015-04-13 | Disposition: A | Discharge status: Home / Self Care | Payer: Medicare Other | Source: Ambulatory Visit | Attending: Internal Medicine | Admitting: Internal Medicine

## 2015-04-13 ENCOUNTER — Ambulatory Visit (INDEPENDENT_AMBULATORY_CARE_PROVIDER_SITE_OTHER): Payer: Medicare Other | Admitting: Internal Medicine

## 2015-04-13 ENCOUNTER — Encounter: Payer: Self-pay | Admitting: Internal Medicine

## 2015-04-13 VITALS — BP 162/60 | HR 67 | Temp 97.9°F | Ht 62.0 in | Wt 147.0 lb

## 2015-04-13 DIAGNOSIS — I5032 Chronic diastolic (congestive) heart failure: Secondary | ICD-10-CM

## 2015-04-13 DIAGNOSIS — R05 Cough: Secondary | ICD-10-CM

## 2015-04-13 DIAGNOSIS — R062 Wheezing: Secondary | ICD-10-CM

## 2015-04-13 DIAGNOSIS — Z0189 Encounter for other specified special examinations: Secondary | ICD-10-CM | POA: Diagnosis not present

## 2015-04-13 DIAGNOSIS — Z79899 Other long term (current) drug therapy: Secondary | ICD-10-CM | POA: Diagnosis not present

## 2015-04-13 DIAGNOSIS — Z0000 Encounter for general adult medical examination without abnormal findings: Secondary | ICD-10-CM

## 2015-04-13 DIAGNOSIS — R059 Cough, unspecified: Secondary | ICD-10-CM | POA: Insufficient documentation

## 2015-04-13 MED ORDER — PREDNISONE 10 MG PO TABS: ORAL_TABLET | ORAL | Status: DC

## 2015-04-13 MED ORDER — HYDROCODONE-HOMATROPINE 5-1.5 MG/5ML PO SYRP: 5.0000 mL | ORAL_SOLUTION | Freq: Four times a day (QID) | ORAL | Status: DC | PRN

## 2015-04-13 MED ORDER — LEVOFLOXACIN 250 MG PO TABS: 250.0000 mg | ORAL_TABLET | Freq: Every day | ORAL | Status: DC

## 2015-04-13 MED ORDER — METHYLPREDNISOLONE ACETATE 80 MG/ML IJ SUSP
80.0000 mg | Freq: Once | INTRAMUSCULAR | Status: AC
  Administered 2015-04-13: 80 mg via INTRAMUSCULAR

## 2015-04-13 NOTE — Telephone Encounter (Signed)
Unfortunately, I'm not able to accept any more new patients at this time.  I'm sorry! Thank you!  

## 2015-04-13 NOTE — Progress Notes (Signed)
Subjective:    Patient ID: [name]., male    DOB: [birth year], 82 y.o.   MRN: [id number]  HPI  Here with acute onset mild to mod 2-3 days ST, HA, general weakness and malaise, with prod cough greenish sputum, but Pt denies chest pain, increased sob or doe, wheezing, orthopnea, PND, increased LE swelling, palpitations, dizziness or syncope, except for onset wheezing and brownish prod cough x 1 day, with mild sob.  Pt denies new neurological symptoms such as new headache, or facial or extremity weakness or numbness   Pt denies polydipsia, polyuria, Past Medical History  Diagnosis Date  . CORONARY HEART DISEASE     a. s/p CABG;  b. cath 06/27/10: S-Dx occluded, S-PDA 80-90% (tx with PCI); S-OM ok, L-LAD ok;  EF 65-70%  c.  s/p Promus DES to S-PDA 06/2010;   d. Cath 8 2016 LIMA to the LAD patent, SVG to posterior lateral subtotal, SVG to diagonal occluded chronically. The patient had stenting of the native right vessel.  Marland Kitchen FIBRILLATION, ATRIAL   . SLEEP APNEA 09/2001    NPSG AHI 22/HR  . ASTHMA   . Depression   . GERD     with HH, hx esophageal stricture  . DYSLIPIDEMIA   . HYPERTENSION     Echo 3/12: EF 55-60%; mod LVH; mild AS/AI; LAE; PASP 38; mild pulmo HTN  . Personal history of alcoholism ([place])   . Diverticulosis   . Benign liver cyst   . Skin cancer     L forearm  . Cataract     surgery to both eyes  . Esophageal stricture   . Hiatal hernia   . Pneumonia 04/2013 hosp  . Anxiety   . Arthritis   . CHF (congestive heart failure) ([place])   . COPD (chronic obstructive pulmonary disease) ([place])   . Glaucoma   . Osteoporosis   . Subclavian arterial stenosis [facility])    Past Surgical History  Procedure Laterality Date  . Coronary artery bypass graft  02/2007  . Tonsillectomy    . Coronary angioplasty with stent placement  07/2010  . Cataract extraction, bilateral  2007  . Hernia repair  unsure ?60's  . Cholecystectomy  02/21/2011    Procedure: LAPAROSCOPIC CHOLECYSTECTOMY WITH  INTRAOPERATIVE CHOLANGIOGRAM;  Surgeon: Pedro Earls, MD;  Location: [facility];  Service: General;  Laterality: N/A;  . Cardiac catheterization N/A 11/26/2014    Procedure: Left Heart Cath and Cors/Grafts Angiography;  Surgeon: Sherren Mocha, MD;  Location: [place] [facility];  Service: Cardiovascular;  Laterality: N/A;  . Cardiac catheterization N/A 11/26/2014    Procedure: Coronary Stent Intervention;  Surgeon: Sherren Mocha, MD;  Location: [place] [facility];  Service: Cardiovascular;  Laterality: N/A;  . Peripheral vascular catheterization N/A 02/22/2015    Procedure: Upper Extremity Angiography;  Surgeon: Lorretta Harp, MD;  Location: [place] [facility];  Service: Cardiovascular;  Laterality: N/A;    reports that he quit smoking about 36 years ago. His smoking use included Cigarettes. He has a 120 pack-year smoking history. He has never used smokeless tobacco. He reports that he does not drink alcohol or use illicit drugs. family history includes Asthma in his sister; COPD in his sister; Emphysema in his sister; Heart disease in his brother, father, and mother; Hyperlipidemia in his father, mother, and sister; Hypertension in his father, mother, and sister. There is no history of Colon cancer. Allergies  Allergen Reactions  . Ace Inhibitors Other (See Comments)  Severe asthma (COPD)  . Morphine And Related Nausea And Vomiting  . Codeine Nausea And Vomiting  . Lasix [Furosemide] Itching  . Other Other (See Comments)    Maple trees, allergy symptoms   Current Outpatient Prescriptions on File Prior to Visit  Medication Sig Dispense Refill  . acetaminophen (TYLENOL) 325 MG tablet Take 325 mg by mouth every 6 (six) hours as needed for moderate pain or headache.    . albuterol (PROVENTIL HFA;VENTOLIN HFA) 108 (90 BASE) MCG/ACT inhaler Inhale 2 puffs into the lungs every 6 (six) hours as needed for wheezing or shortness of breath. 8 g 5  . apixaban (ELIQUIS) 5 MG TABS tablet Take 1 tablet  (5 mg total) by mouth 2 (two) times daily. 60 tablet 5  . budesonide (PULMICORT) 0.25 MG/2ML nebulizer solution Take 0.25 mg by nebulization at bedtime. For asthma    . calcium-vitamin D (OSCAL WITH D) 500-200 MG-UNIT per tablet Take 1 tablet by mouth 2 (two) times daily.    . cholestyramine (QUESTRAN) 4 G packet MIX AND DRINK 1 PACKET BY MOUTH TWICE A DAY IF NEEDED FOR LOOSE STOOLS 60 packet 0  . cloNIDine (CATAPRES) 0.3 MG tablet Take 1 tablet (0.3 mg total) by mouth 2 (two) times daily. 60 tablet 0  . clopidogrel (PLAVIX) 75 MG tablet Take 1 tablet (75 mg total) by mouth daily with breakfast. 30 tablet 03  . DULERA 100-5 MCG/ACT AERO inhale 2 puffs by mouth once daily (THEN RINSE MOUTH AFTER USE) 17.6 g 5  . ipratropium-albuterol (DUONEB) 0.5-2.5 (3) MG/3ML SOLN USE ONE VIAL VIA NEBULIZER EVERY 6 HOURS AS NEEDED FOR SHORTNESS OF BREATH 360 mL 5  . isosorbide mononitrate (IMDUR) 60 MG 24 hr tablet Take 1 tablet (60 mg total) by mouth daily. 90 tablet 3  . latanoprost (XALATAN) 0.005 % ophthalmic solution Place 1 drop into both eyes at bedtime.     Marland Kitchen LORazepam (ATIVAN) 0.5 MG tablet Take 1 tablet (0.5 mg total) by mouth 3 (three) times daily. 90 tablet 5  . losartan (COZAAR) 100 MG tablet Take 1 tablet (100 mg total) by mouth every evening. 90 tablet 3  . metoprolol tartrate (LOPRESSOR) 100 MG tablet Take 1 tablet (100 mg total) by mouth 2 (two) times daily. 60 tablet 11  . montelukast (SINGULAIR) 10 MG tablet Take 1 tablet (10 mg total) by mouth at bedtime. 90 tablet 3  . nitroGLYCERIN (NITROSTAT) 0.4 MG SL tablet Place 1 tablet (0.4 mg total) under the tongue every 5 (five) minutes as needed for chest pain. 25 tablet 0  . NONFORMULARY OR COMPOUNDED ITEM Allergy Vaccine 1:10 Given at Home    . pantoprazole (PROTONIX) 40 MG tablet take 1 tablet by mouth twice a day (Patient taking differently: take 40 mg by mouth twice a day) 60 tablet 11  . potassium chloride SA (K-DUR,KLOR-CON) 20 MEQ tablet  Take 1 tablet (20 mEq total) by mouth 2 (two) times daily. 60 tablet 6  . pravastatin (PRAVACHOL) 80 MG tablet take 1 tablet by mouth every morning 30 tablet 0  . Probiotic Product (PHILLIPS COLON HEALTH PO) Take 1 capsule by mouth every evening.     Marland Kitchen RA VITAMIN B-12 TR 1000 MCG TBCR take 1 tablet by mouth once daily (Patient taking differently: take 1000 mcg by mouth once daily) 30 tablet 11  . spironolactone (ALDACTONE) 25 MG tablet Take 1 tablet (25 mg total) by mouth daily. 30 tablet 11   No current facility-administered medications on file  prior to visit.   Review of Systems  Constitutional: Negative for unusual diaphoresis or night sweats HENT: Negative for ringing in ear or discharge Eyes: Negative for double vision or worsening visual disturbance.  Respiratory: Negative for choking and stridor.   Gastrointestinal: Negative for vomiting or other signifcant bowel change Genitourinary: Negative for hematuria or change in urine volume.  Musculoskeletal: Negative for other MSK pain or swelling Skin: Negative for color change and worsening wound.  Neurological: Negative for tremors and numbness other than noted  Psychiatric/Behavioral: Negative for decreased concentration or agitation other than above       Objective:   Physical Exam BP 162/60 mmHg  Pulse 67  Temp(Src) 97.9 F (36.6 C) (Oral)  Ht 5\' 2"  (1.575 m)  Wt 147 lb (66.679 kg)  BMI 26.88 kg/m2  SpO2 95% VS noted,  Constitutional: Pt appears in no significant distress HENT: Head: NCAT.  Right Ear: External ear normal.  Left Ear: External ear normal.  Eyes: . Pupils are equal, round, and reactive to light. Conjunctivae and EOM are normal Neck: Normal range of motion. Neck supple.  Cardiovascular: Normal rate and regular rhythm.   Pulmonary/Chest: Effort normal and breath sounds with Bilat insp and exp wheezes, mild, with few RLL rales and decreased BS.  Neurological: Pt is alert. Not confused , motor grossly  intact Skin: Skin is warm. No rash, no LE edema Psychiatric: Pt behavior is normal. No agitation.     Assessment & Plan:

## 2015-04-13 NOTE — Progress Notes (Signed)
Pre visit review using our clinic review tool, if applicable. No additional management support is needed unless otherwise documented below in the visit note. 

## 2015-04-13 NOTE — Telephone Encounter (Signed)
Patient is requesting to become a patient of yours. Is this something that you can do ?

## 2015-04-13 NOTE — Patient Instructions (Addendum)
You had the steroid shot today  Please take all new medication as prescribed - the antibiotic, cough medicine, and prednisone  Please continue all other medications as before, and refills have been done if requested.  Please have the pharmacy call with any other refills you may need.  Please keep your appointments with your specialists as you may have planned  Please go to the XRAY Department in the Basement (go straight as you get off the elevator) for the x-ray testing  You will be contacted by phone if any changes need to be made immediately.  Otherwise, you will receive a letter about your results with an explanation, but please check with MyChart first.  Please return in 3 weeks or sooner if needed, with Lab testing done 3-5 days before

## 2015-04-14 ENCOUNTER — Telehealth: Payer: Self-pay | Admitting: *Deleted

## 2015-04-14 ENCOUNTER — Other Ambulatory Visit: Payer: Self-pay | Admitting: Internal Medicine

## 2015-04-14 ENCOUNTER — Encounter: Payer: Self-pay | Admitting: Internal Medicine

## 2015-04-14 DIAGNOSIS — J690 Pneumonitis due to inhalation of food and vomit: Secondary | ICD-10-CM

## 2015-04-14 DIAGNOSIS — E875 Hyperkalemia: Secondary | ICD-10-CM

## 2015-04-14 LAB — BASIC METABOLIC PANEL
BUN: 27 mg/dL — ABNORMAL HIGH (ref 7–25)
CALCIUM: 8.9 mg/dL (ref 8.6–10.3)
CO2: 24 mmol/L (ref 20–31)
Chloride: 96 mmol/L — ABNORMAL LOW (ref 98–110)
Creat: 1.24 mg/dL — ABNORMAL HIGH (ref 0.70–1.11)
Glucose, Bld: 99 mg/dL (ref 65–99)
POTASSIUM: 5.4 mmol/L — AB (ref 3.5–5.3)
SODIUM: 129 mmol/L — AB (ref 135–146)

## 2015-04-14 NOTE — Assessment & Plan Note (Signed)
Mild to mod, c/w bronchitis vs pna, for cxr, for antibx course, cough med prn,  to f/u any worsening symptoms or concerns 

## 2015-04-14 NOTE — Assessment & Plan Note (Signed)
Mild to mod, for depomedrol IM, predpac asd, to f/u any worsening symptoms or concerns 

## 2015-04-14 NOTE — Telephone Encounter (Addendum)
-----   Message from Minus Breeding, MD sent at 04/14/2015  2:05 PM EST ----- Na continues to fall.  Potassium is increased.  Have him hold his KCL and repeat a BMET in one week.  Send results to Gwendolyn Grant, MD  Left message for pt to call

## 2015-04-14 NOTE — Assessment & Plan Note (Signed)
Compensated,o/w stable overall by history and exam,  and pt to continue medical treatment as before,  to f/u any worsening symptoms or concerns

## 2015-04-20 ENCOUNTER — Other Ambulatory Visit: Payer: Self-pay | Admitting: Cardiology

## 2015-04-20 NOTE — Telephone Encounter (Signed)
Rx request sent to pharmacy.  

## 2015-04-21 ENCOUNTER — Ambulatory Visit: Payer: Medicare Other | Admitting: Internal Medicine

## 2015-04-21 NOTE — Telephone Encounter (Signed)
Pt's wife is returning Debra's call about some lab results. Please f/u   Thanks

## 2015-04-21 NOTE — Telephone Encounter (Signed)
Received a call from patient's wife.04/13/14 lab results given.Advised to stay off potassium.Repeat potassium in 1 week.Advised keep appointment with PCP Billey Gosling 05/05/15 at 2:30 pm.

## 2015-04-22 ENCOUNTER — Telehealth: Payer: Self-pay | Admitting: Internal Medicine

## 2015-04-22 NOTE — Telephone Encounter (Signed)
Allergy Serum Extract Date Mixed: 04/22/15 Vial: 1 Strength: 1:10 Here/Mail/Pick Up: mail Mixed By: tbs Last OV: 01/04/15 Pending OV: 05/07/15

## 2015-04-23 DIAGNOSIS — J309 Allergic rhinitis, unspecified: Secondary | ICD-10-CM | POA: Diagnosis not present

## 2015-04-26 ENCOUNTER — Ambulatory Visit (INDEPENDENT_AMBULATORY_CARE_PROVIDER_SITE_OTHER): Payer: Medicare Other | Admitting: Internal Medicine

## 2015-04-26 ENCOUNTER — Encounter: Payer: Self-pay | Admitting: Internal Medicine

## 2015-04-26 VITALS — BP 154/66 | HR 62 | Temp 98.2°F | Resp 18 | Wt 144.0 lb

## 2015-04-26 DIAGNOSIS — J441 Chronic obstructive pulmonary disease with (acute) exacerbation: Secondary | ICD-10-CM | POA: Diagnosis not present

## 2015-04-26 DIAGNOSIS — J189 Pneumonia, unspecified organism: Secondary | ICD-10-CM

## 2015-04-26 MED ORDER — MOXIFLOXACIN HCL 400 MG PO TABS: 400.0000 mg | ORAL_TABLET | Freq: Every day | ORAL | Status: DC

## 2015-04-26 MED ORDER — PREDNISONE 10 MG PO TABS: ORAL_TABLET | ORAL | Status: DC

## 2015-04-26 NOTE — Patient Instructions (Signed)
  Medications reviewed and updated. We will start another antibiotic and prednisone taper.   Your prescription(s) have been submitted to your pharmacy. Please take as directed and contact our office if you believe you are having problem(s) with the medication(s).

## 2015-04-26 NOTE — Progress Notes (Signed)
Pre visit review using our clinic review tool, if applicable. No additional management support is needed unless otherwise documented below in the visit note. 

## 2015-04-26 NOTE — Progress Notes (Signed)
Subjective:    Patient ID: Stephen Door., male    DOB: 1931/03/06, 82 y.o.   MRN: [id number]  HPI He is here because he is not seeing significant improvement of his pneumonia.  He was here earlier this month was diagnosed with probable pneumonia. He was prescribed Levaquin 250 mg twice daily for 10 days, prednisone taper, received a steroid injection in the office and was prescribed cough syrup with codeine. He has taken the medication as prescribed.  Physical slightly better, but he is still coughing. His cough is productive of light green-brown phlegm. He is still wheezing and feel short of breath. He continues to have excessive rhinorrhea, sore throat and hoarseness.  He denies fevers, headaches, lightheadedness, sinus pain, ear pain, abdominal pain, nausea and diarrhea.  Overall he does not feel better.  He knows his body well and feels he still has pneumonia.  He has history of asthma and COPD.   Medications and allergies reviewed with patient and updated if appropriate.  Patient Active Problem List   Diagnosis Date Noted  . Cough 04/13/2015  . Wheezing 04/13/2015  . PAD (peripheral artery disease) ([place]) 02/22/2015  . Subclavian artery stenosis, left 12/23/2014  . NSTEMI (non-ST elevated myocardial infarction) ([place])   . Chest pain 11/25/2014  . Tachycardia 11/24/2014  . Hx of CABG 2008 08/31/2014  . CAD S/P S-PDA DES 2012 08/31/2014  . PVD (peripheral vascular disease) ([place]) 08/31/2014  . Murmur 08/31/2014  . Anxiety about health 08/31/2014  . Edema 07/27/2014  . Hypertensive urgency 07/20/2014  . Accelerated hypertension 04/22/2014  . Abnormal chest x-ray 03/26/2014  . Abnormal CT scan, esophagus 01/07/2014  . Generalized anxiety disorder   . Anemia, iron deficiency   . PAF (paroxysmal atrial fibrillation) ([place]) 03/08/2013  . Chronic diastolic heart failure ([place]) 10/30/2012  . Osteoporosis 06/12/2012  . Bruit of left carotid artery 09/27/2011  . GERD  11/19/2009  . Hyperlipidemia with target LDL less than 100 10/04/2009  . ESOPHAGEAL STRICTURE 12/03/2007  . Coronary atherosclerosis 05/10/2007  . Seasonal and perennial allergic rhinitis 05/10/2007  . Asthma with COPD ([place]) 05/10/2007  . Obstructive sleep apnea 05/10/2007    Current Outpatient Prescriptions on File Prior to Visit  Medication Sig Dispense Refill  . acetaminophen (TYLENOL) 325 MG tablet Take 325 mg by mouth every 6 (six) hours as needed for moderate pain or headache.    . albuterol (PROVENTIL HFA;VENTOLIN HFA) 108 (90 BASE) MCG/ACT inhaler Inhale 2 puffs into the lungs every 6 (six) hours as needed for wheezing or shortness of breath. 8 g 5  . apixaban (ELIQUIS) 5 MG TABS tablet Take 1 tablet (5 mg total) by mouth 2 (two) times daily. 60 tablet 5  . budesonide (PULMICORT) 0.25 MG/2ML nebulizer solution Take 0.25 mg by nebulization at bedtime. For asthma    . calcium-vitamin D (OSCAL WITH D) 500-200 MG-UNIT per tablet Take 1 tablet by mouth 2 (two) times daily.    . cholestyramine (QUESTRAN) 4 G packet MIX AND DRINK 1 PACKET BY MOUTH TWICE A DAY IF NEEDED FOR LOOSE STOOLS 60 packet 0  . cloNIDine (CATAPRES) 0.3 MG tablet Take 1 tablet (0.3 mg total) by mouth 2 (two) times daily. 60 tablet 0  . clopidogrel (PLAVIX) 75 MG tablet Take 1 tablet (75 mg total) by mouth daily with breakfast. 30 tablet 03  . DULERA 100-5 MCG/ACT AERO inhale 2 puffs by mouth once daily (THEN RINSE MOUTH AFTER USE) 17.6 g 5  .  ipratropium-albuterol (DUONEB) 0.5-2.5 (3) MG/3ML SOLN USE ONE VIAL VIA NEBULIZER EVERY 6 HOURS AS NEEDED FOR SHORTNESS OF BREATH 360 mL 5  . isosorbide mononitrate (IMDUR) 60 MG 24 hr tablet Take 1 tablet (60 mg total) by mouth daily. 90 tablet 3  . latanoprost (XALATAN) 0.005 % ophthalmic solution Place 1 drop into both eyes at bedtime.     Marland Kitchen LORazepam (ATIVAN) 0.5 MG tablet Take 1 tablet (0.5 mg total) by mouth 3 (three) times daily. 90 tablet 5  . losartan (COZAAR) 100 MG  tablet Take 1 tablet (100 mg total) by mouth every evening. 90 tablet 3  . metoprolol tartrate (LOPRESSOR) 100 MG tablet Take 1 tablet (100 mg total) by mouth 2 (two) times daily. 60 tablet 11  . montelukast (SINGULAIR) 10 MG tablet Take 1 tablet (10 mg total) by mouth at bedtime. 90 tablet 3  . nitroGLYCERIN (NITROSTAT) 0.4 MG SL tablet Place 1 tablet (0.4 mg total) under the tongue every 5 (five) minutes as needed for chest pain. 25 tablet 0  . NONFORMULARY OR COMPOUNDED ITEM Allergy Vaccine 1:10 Given at Home    . pantoprazole (PROTONIX) 40 MG tablet take 1 tablet by mouth twice a day (Patient taking differently: take 40 mg by mouth twice a day) 60 tablet 11  . potassium chloride SA (K-DUR,KLOR-CON) 20 MEQ tablet Take 1 tablet (20 mEq total) by mouth 2 (two) times daily. 60 tablet 6  . pravastatin (PRAVACHOL) 80 MG tablet take 1 tablet by mouth every morning 30 tablet 2  . predniSONE (DELTASONE) 10 MG tablet 3 tabs by mouth per day for 3 days,2tabs per day for 3 days,1tab per day for 3 days 18 tablet 0  . Probiotic Product (PHILLIPS COLON HEALTH PO) Take 1 capsule by mouth every evening.     Marland Kitchen RA VITAMIN B-12 TR 1000 MCG TBCR take 1 tablet by mouth once daily (Patient taking differently: take 1000 mcg by mouth once daily) 30 tablet 11  . spironolactone (ALDACTONE) 25 MG tablet Take 1 tablet (25 mg total) by mouth daily. 30 tablet 11   No current facility-administered medications on file prior to visit.    Past Medical History  Diagnosis Date  . CORONARY HEART DISEASE     a. s/p CABG;  b. cath 06/27/10: S-Dx occluded, S-PDA 80-90% (tx with PCI); S-OM ok, L-LAD ok;  EF 65-70%  c.  s/p Promus DES to S-PDA 06/2010;   d. Cath 8 2016 LIMA to the LAD patent, SVG to posterior lateral subtotal, SVG to diagonal occluded chronically. The patient had stenting of the native right vessel.  Marland Kitchen FIBRILLATION, ATRIAL   . SLEEP APNEA 09/2001    NPSG AHI 22/HR  . ASTHMA   . Depression   . GERD     with HH, hx  esophageal stricture  . DYSLIPIDEMIA   . HYPERTENSION     Echo 3/12: EF 55-60%; mod LVH; mild AS/AI; LAE; PASP 38; mild pulmo HTN  . Personal history of alcoholism ([place])   . Diverticulosis   . Benign liver cyst   . Skin cancer     L forearm  . Cataract     surgery to both eyes  . Esophageal stricture   . Hiatal hernia   . Pneumonia 04/2013 hosp  . Anxiety   . Arthritis   . CHF (congestive heart failure) ([place])   . COPD (chronic obstructive pulmonary disease) ([place])   . Glaucoma   . Osteoporosis   . Subclavian arterial  stenosis [facility])     Past Surgical History  Procedure Laterality Date  . Coronary artery bypass graft  02/2007  . Tonsillectomy    . Coronary angioplasty with stent placement  07/2010  . Cataract extraction, bilateral  2007  . Hernia repair  unsure ?60's  . Cholecystectomy  02/21/2011    Procedure: LAPAROSCOPIC CHOLECYSTECTOMY WITH INTRAOPERATIVE CHOLANGIOGRAM;  Surgeon: Pedro Earls, MD;  [facility];  Service: General;  Laterality: N/A;  . Cardiac catheterization N/A 11/26/2014    Procedure: Left Heart Cath and Cors/Grafts Angiography;  Surgeon: Sherren Mocha, MD;  Location: [place] [facility];  Service: Cardiovascular;  Laterality: N/A;  . Cardiac catheterization N/A 11/26/2014    Procedure: Coronary Stent Intervention;  Surgeon: Sherren Mocha, MD;  Location: [place] [facility];  Service: Cardiovascular;  Laterality: N/A;  . Peripheral vascular catheterization N/A 02/22/2015    Procedure: Upper Extremity Angiography;  Surgeon: Lorretta Harp, MD;  Location: [place] [facility];  Service: Cardiovascular;  Laterality: N/A;    Social History   Social History  . Marital Status: Married    Spouse Name: N/A  . Number of Children: 2  . Years of Education: N/A   Occupational History  . retired     19 years   Social History Main Topics  . Smoking status: Former Smoker -- 3.00 packs/day for 40 years    Types: Cigarettes    Quit date: 04/11/1979  .  Smokeless tobacco: Never Used  . Alcohol Use: No     Comment: quit drinking 40+ years  . Drug Use: No  . Sexual Activity: Yes   Other Topics Concern  . 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 Married lives with wife 2 children    Family History  Problem Relation Age of Onset  . COPD Sister   . Asthma Sister   . Emphysema Sister   . Hypertension Sister   . Hyperlipidemia Sister   . Hypertension Mother   . Heart disease Mother   . Hyperlipidemia Mother   . Colon cancer Neg Hx   . Heart disease Brother   . Heart disease Father   . Hyperlipidemia Father   . Hypertension Father     Review of Systems See history of present illness    Objective:   Filed Vitals:   04/26/15 1610  BP: 154/66  Pulse: 62  Temp: 98.2 F (36.8 C)  Resp: 18   Filed Weights   04/26/15 1610  Weight: 144 lb (65.318 kg)   Body mass index is 26.33 kg/(m^2).   Physical Exam GENERAL APPEARANCE: Appears stated age, moderately ill appearing, NAD EYES: conjunctiva clear, no icterus HEENT: bilateral tympanic membranes and ear canals normal, oropharynx with moderate erythema, no thyromegaly, trachea midline, no cervical or supraclavicular lymphadenopathy LUNGS: unlabored breathing, good air entry bilaterally, diffuse expiratory wheeze, no crackles HEART: Normal S1,S2 without murmurs EXTREMITIES: Without clubbing, cyanosis, or edema      Assessment & Plan:   Pneumonia, asthma exacerbation, COPD exacerbation He still has significant wheezing on exam and has significant symptoms to make me think that his infection has not been completely her successfully treated Avelox 400 mg daily for 14 days Prednisone taper Continue inhalers Continue increased rest and fluids Over-the-counter cold medications as tolerated We will push back his chest x-ray-repeat in about 4 weeks from today He will follow-up with me next week and has a follow-up appointment with pulmonary

## 2015-04-29 ENCOUNTER — Telehealth: Payer: Self-pay | Admitting: Cardiology

## 2015-04-29 DIAGNOSIS — Z79899 Other long term (current) drug therapy: Secondary | ICD-10-CM

## 2015-04-29 DIAGNOSIS — E875 Hyperkalemia: Secondary | ICD-10-CM | POA: Diagnosis not present

## 2015-04-29 NOTE — Telephone Encounter (Signed)
Please call,conerning blood work he is suppose to have.

## 2015-04-29 NOTE — Telephone Encounter (Signed)
labwork ordered for [address] location at pt request.

## 2015-04-29 NOTE — Telephone Encounter (Signed)
Error did not need to open encounter

## 2015-04-30 ENCOUNTER — Other Ambulatory Visit: Payer: Self-pay | Admitting: *Deleted

## 2015-04-30 DIAGNOSIS — E875 Hyperkalemia: Secondary | ICD-10-CM

## 2015-04-30 LAB — BASIC METABOLIC PANEL
BUN: 46 mg/dL — ABNORMAL HIGH (ref 7–25)
CALCIUM: 9.1 mg/dL (ref 8.6–10.3)
CO2: 28 mmol/L (ref 20–31)
CREATININE: 0.81 mg/dL (ref 0.70–1.11)
Chloride: 95 mmol/L — ABNORMAL LOW (ref 98–110)
Glucose, Bld: 121 mg/dL — ABNORMAL HIGH (ref 65–99)
Potassium: 6 mmol/L — ABNORMAL HIGH (ref 3.5–5.3)
Sodium: 129 mmol/L — ABNORMAL LOW (ref 135–146)

## 2015-04-30 MED ORDER — SODIUM POLYSTYRENE SULFONATE 15 GM/60ML PO SUSP: 15.0000 g | Freq: Once | ORAL | Status: DC

## 2015-05-03 ENCOUNTER — Other Ambulatory Visit (INDEPENDENT_AMBULATORY_CARE_PROVIDER_SITE_OTHER): Payer: Medicare Other | Admitting: *Deleted

## 2015-05-03 DIAGNOSIS — E875 Hyperkalemia: Secondary | ICD-10-CM

## 2015-05-03 LAB — BASIC METABOLIC PANEL
BUN: 37 mg/dL — AB (ref 7–25)
CO2: 27 mmol/L (ref 20–31)
CREATININE: 1.04 mg/dL (ref 0.70–1.11)
Calcium: 8.8 mg/dL (ref 8.6–10.3)
Chloride: 93 mmol/L — ABNORMAL LOW (ref 98–110)
Glucose, Bld: 95 mg/dL (ref 65–99)
Potassium: 4.9 mmol/L (ref 3.5–5.3)
Sodium: 129 mmol/L — ABNORMAL LOW (ref 135–146)

## 2015-05-04 ENCOUNTER — Other Ambulatory Visit: Payer: Medicare Other

## 2015-05-05 ENCOUNTER — Encounter: Payer: Self-pay | Admitting: Internal Medicine

## 2015-05-05 ENCOUNTER — Ambulatory Visit: Payer: Medicare Other | Admitting: Internal Medicine

## 2015-05-05 ENCOUNTER — Ambulatory Visit (INDEPENDENT_AMBULATORY_CARE_PROVIDER_SITE_OTHER): Payer: Medicare Other | Admitting: Internal Medicine

## 2015-05-05 VITALS — BP 164/70 | HR 60 | Temp 98.1°F | Resp 16 | Wt 143.0 lb

## 2015-05-05 DIAGNOSIS — I2510 Atherosclerotic heart disease of native coronary artery without angina pectoris: Secondary | ICD-10-CM | POA: Diagnosis not present

## 2015-05-05 DIAGNOSIS — M810 Age-related osteoporosis without current pathological fracture: Secondary | ICD-10-CM | POA: Diagnosis not present

## 2015-05-05 DIAGNOSIS — K219 Gastro-esophageal reflux disease without esophagitis: Secondary | ICD-10-CM | POA: Diagnosis not present

## 2015-05-05 DIAGNOSIS — D509 Iron deficiency anemia, unspecified: Secondary | ICD-10-CM

## 2015-05-05 DIAGNOSIS — I10 Essential (primary) hypertension: Secondary | ICD-10-CM

## 2015-05-05 DIAGNOSIS — F411 Generalized anxiety disorder: Secondary | ICD-10-CM | POA: Diagnosis not present

## 2015-05-05 DIAGNOSIS — Z9861 Coronary angioplasty status: Secondary | ICD-10-CM

## 2015-05-05 NOTE — Patient Instructions (Addendum)
Make sure you are taking calcium citrate not calcium carbonate (which is not absorbed when taking pantoprazole).  Start walking daily.   Follow up in 4 months.  Have blood work done one week prior - you do need to fast.

## 2015-05-05 NOTE — Assessment & Plan Note (Addendum)
Takes ativan three times a day controlled Continue same

## 2015-05-05 NOTE — Assessment & Plan Note (Addendum)
2016 dexa showed severe osteopenia, which is an improvement since 2014 (osteoporosis) Taking calcium and vitamin D, but unsure of its calcium citrate or calcium carbonate. Advised to make sure he is taking calcium citrate since he is on pantoprazole twice daily Start walking-stressed the importance of walking We'll hold off on any medication at this time DEXA due in 2018

## 2015-05-05 NOTE — Assessment & Plan Note (Addendum)
Taking protonix twice daily Controlled Continue twice daily for now given his history of esophageal stricture and heartburn symptoms with once a day treatment

## 2015-05-05 NOTE — Assessment & Plan Note (Signed)
Asymptomatic Following with cardiology Continue current medications 

## 2015-05-05 NOTE — Progress Notes (Signed)
Subjective:    Patient ID: [name]., male    DOB: Apr 28, 1930, 82 y.o.   MRN: [id number]  HPI He is here to establish with a new pcp and follow-up on his recent infection/COPD exacerbation.     Copd, uri:  He is still taking the antibiotic and steroids and has 5 days left.  He has had improvement.  He denies coughing and wheezing at this point.  He denies shortness of breath and his lungs are at his baseline.  His energy level is still low.  He is using his inhalers.  He has been experiencing gum pain since starting the antibiotic, but wants to remain at her next few days.  CAD s/p MI, AFib: He is following with cardiology. He denies any chest pain, palpitations, leg edema, shortness of breath, headaches and lightheadedness. He is taking HIS medication as prescribed. He is currently not exercising because of his upper respiratory infection and prior to that was exercising minimally. He was not doing any cardio.  Hypertension: He is taking his medication daily. He is compliant with a low sodium diet.  He denies chest pain, palpitations, edema, shortness of breath and regular headaches. He is not exercising regularly.  His BP is not controlled and cardiology has tried several medications, but nothing has helped.    Osteoporosis: Taking calcium and vitamin d daily.  He does exercise regularly (silver sneakers), but not since been sick. He is not walking.  He took prolia and his copay was $50 and then second time he was going to have it the copay was over $400 so he did not get it.  He is not taking medication since then. His last bone density was 2016 and it showed severe osteopenia, so technically he does not have osteoporosis now.  Medications and allergies reviewed with patient and updated if appropriate.  Patient Active Problem List   Diagnosis Date Noted  . Cough 04/13/2015  . Wheezing 04/13/2015  . PAD (peripheral artery disease) ([place]) 02/22/2015  . Subclavian artery stenosis,  left 12/23/2014  . NSTEMI (non-ST elevated myocardial infarction) ([place])   . Chest pain 11/25/2014  . Tachycardia 11/24/2014  . Hx of CABG 2008 08/31/2014  . CAD S/P S-PDA DES 2012 08/31/2014  . PVD (peripheral vascular disease) ([place]) 08/31/2014  . Murmur 08/31/2014  . Anxiety about health 08/31/2014  . Edema 07/27/2014  . Hypertensive urgency 07/20/2014  . Accelerated hypertension 04/22/2014  . Abnormal chest x-ray 03/26/2014  . Abnormal CT scan, esophagus 01/07/2014  . Generalized anxiety disorder   . Anemia, iron deficiency   . PAF (paroxysmal atrial fibrillation) ([place]) 03/08/2013  . Chronic diastolic heart failure ([place]) 10/30/2012  . Osteoporosis 06/12/2012  . Bruit of left carotid artery 09/27/2011  . GERD 11/19/2009  . Hyperlipidemia with target LDL less than 100 10/04/2009  . ESOPHAGEAL STRICTURE 12/03/2007  . Coronary atherosclerosis 05/10/2007  . Seasonal and perennial allergic rhinitis 05/10/2007  . Asthma with COPD ([place]) 05/10/2007  . Obstructive sleep apnea 05/10/2007    Current Outpatient Prescriptions on File Prior to Visit  Medication Sig Dispense Refill  . acetaminophen (TYLENOL) 325 MG tablet Take 325 mg by mouth every 6 (six) hours as needed for moderate pain or headache.    . albuterol (PROVENTIL HFA;VENTOLIN HFA) 108 (90 BASE) MCG/ACT inhaler Inhale 2 puffs into the lungs every 6 (six) hours as needed for wheezing or shortness of breath. 8 g 5  . apixaban (ELIQUIS) 5 MG TABS tablet Take  1 tablet (5 mg total) by mouth 2 (two) times daily. 60 tablet 5  . budesonide (PULMICORT) 0.25 MG/2ML nebulizer solution Take 0.25 mg by nebulization at bedtime. For asthma    . calcium-vitamin D (OSCAL WITH D) 500-200 MG-UNIT per tablet Take 1 tablet by mouth 2 (two) times daily.    . cholestyramine (QUESTRAN) 4 G packet MIX AND DRINK 1 PACKET BY MOUTH TWICE A DAY IF NEEDED FOR LOOSE STOOLS 60 packet 0  . cloNIDine (CATAPRES) 0.3 MG tablet Take 1 tablet (0.3 mg total) by mouth  2 (two) times daily. 60 tablet 0  . clopidogrel (PLAVIX) 75 MG tablet Take 1 tablet (75 mg total) by mouth daily with breakfast. 30 tablet 03  . DULERA 100-5 MCG/ACT AERO inhale 2 puffs by mouth once daily (THEN RINSE MOUTH AFTER USE) 17.6 g 5  . ipratropium-albuterol (DUONEB) 0.5-2.5 (3) MG/3ML SOLN USE ONE VIAL VIA NEBULIZER EVERY 6 HOURS AS NEEDED FOR SHORTNESS OF BREATH 360 mL 5  . isosorbide mononitrate (IMDUR) 60 MG 24 hr tablet Take 1 tablet (60 mg total) by mouth daily. 90 tablet 3  . latanoprost (XALATAN) 0.005 % ophthalmic solution Place 1 drop into both eyes at bedtime.     Marland Kitchen LORazepam (ATIVAN) 0.5 MG tablet Take 1 tablet (0.5 mg total) by mouth 3 (three) times daily. 90 tablet 5  . losartan (COZAAR) 100 MG tablet Take 1 tablet (100 mg total) by mouth every evening. 90 tablet 3  . metoprolol tartrate (LOPRESSOR) 100 MG tablet Take 1 tablet (100 mg total) by mouth 2 (two) times daily. 60 tablet 11  . montelukast (SINGULAIR) 10 MG tablet Take 1 tablet (10 mg total) by mouth at bedtime. 90 tablet 3  . moxifloxacin (AVELOX) 400 MG tablet Take 1 tablet (400 mg total) by mouth daily. 14 tablet 0  . nitroGLYCERIN (NITROSTAT) 0.4 MG SL tablet Place 1 tablet (0.4 mg total) under the tongue every 5 (five) minutes as needed for chest pain. 25 tablet 0  . NONFORMULARY OR COMPOUNDED ITEM Allergy Vaccine 1:10 Given at Home    . pantoprazole (PROTONIX) 40 MG tablet take 1 tablet by mouth twice a day (Patient taking differently: take 40 mg by mouth twice a day) 60 tablet 11  . pravastatin (PRAVACHOL) 80 MG tablet take 1 tablet by mouth every morning 30 tablet 2  . predniSONE (DELTASONE) 10 MG tablet Take 4 tabs po qd x 3 days, then 3 tabs po qd x 3 days, then 2 tabs po qd x 3 days, then 1 tab po qd x 3 days 30 tablet 0  . Probiotic Product (PHILLIPS COLON HEALTH PO) Take 1 capsule by mouth every evening.     Marland Kitchen RA VITAMIN B-12 TR 1000 MCG TBCR take 1 tablet by mouth once daily (Patient taking  differently: take 1000 mcg by mouth once daily) 30 tablet 11  . sodium polystyrene (KAYEXALATE) 15 GM/60ML suspension Take 60 mLs (15 g total) by mouth once. 60 mL 1   No current facility-administered medications on file prior to visit.    Past Medical History  Diagnosis Date  . CORONARY HEART DISEASE     a. s/p CABG;  b. cath 06/27/10: S-Dx occluded, S-PDA 80-90% (tx with PCI); S-OM ok, L-LAD ok;  EF 65-70%  c.  s/p Promus DES to S-PDA 06/2010;   d. Cath 8 2016 LIMA to the LAD patent, SVG to posterior lateral subtotal, SVG to diagonal occluded chronically. The patient had stenting of the  native right vessel.  Marland Kitchen FIBRILLATION, ATRIAL   . SLEEP APNEA 09/2001    NPSG AHI 22/HR  . ASTHMA   . Depression   . GERD     with HH, hx esophageal stricture  . DYSLIPIDEMIA   . HYPERTENSION     Echo 3/12: EF 55-60%; mod LVH; mild AS/AI; LAE; PASP 38; mild pulmo HTN  . Personal history of alcoholism ([place])   . Diverticulosis   . Benign liver cyst   . Skin cancer     L forearm  . Cataract     surgery to both eyes  . Esophageal stricture   . Hiatal hernia   . Pneumonia 04/2013 hosp  . Anxiety   . Arthritis   . CHF (congestive heart failure) ([place])   . COPD (chronic obstructive pulmonary disease) ([place])   . Glaucoma   . Osteoporosis   . Subclavian arterial stenosis [facility])     Past Surgical History  Procedure Laterality Date  . Coronary artery bypass graft  02/2007  . Tonsillectomy    . Coronary angioplasty with stent placement  07/2010  . Cataract extraction, bilateral  2007  . Hernia repair  unsure ?60's  . Cholecystectomy  02/21/2011    Procedure: LAPAROSCOPIC CHOLECYSTECTOMY WITH INTRAOPERATIVE CHOLANGIOGRAM;  Surgeon: Pedro Earls, MD;  Location: [facility];  Service: General;  Laterality: N/A;  . Cardiac catheterization N/A 11/26/2014    Procedure: Left Heart Cath and Cors/Grafts Angiography;  Surgeon: Sherren Mocha, MD;  Location: [place] [facility];  Service: Cardiovascular;  Laterality:  N/A;  . Cardiac catheterization N/A 11/26/2014    Procedure: Coronary Stent Intervention;  Surgeon: Sherren Mocha, MD;  Location: [place] [facility];  Service: Cardiovascular;  Laterality: N/A;  . Peripheral vascular catheterization N/A 02/22/2015    Procedure: Upper Extremity Angiography;  Surgeon: Lorretta Harp, MD;  Location: [place] [facility];  Service: Cardiovascular;  Laterality: N/A;    Social History   Social History  . Marital Status: Married    Spouse Name: N/A  . Number of Children: 2  . Years of Education: N/A   Occupational History  . retired     78 years   Social History Main Topics  . Smoking status: Former Smoker -- 3.00 packs/day for 40 years    Types: Cigarettes    Quit date: 04/11/1979  . Smokeless tobacco: Never Used  . Alcohol Use: No     Comment: quit drinking 40+ years  . Drug Use: No  . Sexual Activity: Yes   Other Topics Concern  . 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 Married lives with wife 2 children    Family History  Problem Relation Age of Onset  . COPD Sister   . Asthma Sister   . Emphysema Sister   . Hypertension Sister   . Hyperlipidemia Sister   . Hypertension Mother   . Heart disease Mother   . Hyperlipidemia Mother   . Colon cancer Neg Hx   . Heart disease Brother   . Heart disease Father   . Hyperlipidemia Father   . Hypertension Father     Review of Systems  Constitutional: Positive for fatigue. Negative for fever and appetite change.  Respiratory: Negative for cough, shortness of breath and wheezing.   Cardiovascular: Negative for chest pain, palpitations and leg swelling.  Gastrointestinal: Positive for diarrhea (chronic, taking cholestyramine). Negative for nausea, abdominal pain, constipation and blood in stool.  GERD controlled  Neurological: Positive for dizziness (medication related). Negative for light-headedness and headaches.       Objective:   Filed Vitals:     05/05/15 1425  BP: 164/70  Pulse: 60  Temp: 98.1 F (36.7 C)  Resp: 16   Filed Weights   05/05/15 1425  Weight: 143 lb (64.864 kg)   Body mass index is 26.15 kg/(m^2).   Physical Exam Constitutional: Appears well-developed and well-nourished. No distress.  Neck: Neck supple. No tracheal deviation present. No thyromegaly present.  No carotid bruit. No cervical adenopathy.   Cardiovascular: Normal rate, regular rhythm and normal heart sounds.   3/6 systolic murmur heard.  mild edema Pulmonary/Chest: Effort normal and breath sounds normal. No respiratory distress. No wheezes.           Assessment & Plan:   URI/COPD exacerbation: He is 5 days left of prednisone and antibiotic Symptoms at baseline except for energy level Complete medications-he does want to complete the antibiotic despite possible side effects  See Problem List for Assessment and Plan of chronic medical problems.  Follow-up in 4 months-blood work done prior

## 2015-05-05 NOTE — Assessment & Plan Note (Signed)
Difficult to control and cardiology is trying to get blood pressure better controlled Continue current medications-since cardiologyand better I will let them revised medications as they see fit Start walking regularly

## 2015-05-05 NOTE — Progress Notes (Signed)
Pre visit review using our clinic review tool, if applicable. No additional management support is needed unless otherwise documented below in the visit note. 

## 2015-05-07 ENCOUNTER — Ambulatory Visit (INDEPENDENT_AMBULATORY_CARE_PROVIDER_SITE_OTHER)
Admission: RE | Admit: 2015-05-07 | Discharge: 2015-05-07 | Disposition: A | Discharge status: Home / Self Care | Payer: Medicare Other | Source: Ambulatory Visit | Attending: Internal Medicine | Admitting: Internal Medicine

## 2015-05-07 ENCOUNTER — Encounter: Payer: Self-pay | Admitting: Internal Medicine

## 2015-05-07 ENCOUNTER — Other Ambulatory Visit: Payer: Self-pay | Admitting: Internal Medicine

## 2015-05-07 ENCOUNTER — Ambulatory Visit (INDEPENDENT_AMBULATORY_CARE_PROVIDER_SITE_OTHER): Payer: Medicare Other | Admitting: Internal Medicine

## 2015-05-07 VITALS — BP 116/72 | HR 48 | Ht 61.0 in | Wt 147.6 lb

## 2015-05-07 DIAGNOSIS — J449 Chronic obstructive pulmonary disease, unspecified: Secondary | ICD-10-CM

## 2015-05-07 DIAGNOSIS — J309 Allergic rhinitis, unspecified: Secondary | ICD-10-CM | POA: Diagnosis not present

## 2015-05-07 DIAGNOSIS — G4733 Obstructive sleep apnea (adult) (pediatric): Secondary | ICD-10-CM

## 2015-05-07 DIAGNOSIS — J45909 Unspecified asthma, uncomplicated: Secondary | ICD-10-CM | POA: Diagnosis not present

## 2015-05-07 DIAGNOSIS — J302 Other seasonal allergic rhinitis: Secondary | ICD-10-CM

## 2015-05-07 DIAGNOSIS — J189 Pneumonia, unspecified organism: Secondary | ICD-10-CM | POA: Diagnosis not present

## 2015-05-07 DIAGNOSIS — J690 Pneumonitis due to inhalation of food and vomit: Secondary | ICD-10-CM

## 2015-05-07 DIAGNOSIS — J3089 Other allergic rhinitis: Secondary | ICD-10-CM

## 2015-05-07 NOTE — Assessment & Plan Note (Signed)
He continues to benefit from CPAP used every night

## 2015-05-07 NOTE — Assessment & Plan Note (Signed)
Acute bronchitis exacerbation seems resolved now. He is having distinct malaise and weakness lasting several hours after each Avelox dose and has mostly completed his course. Plan-stop Avelox now for observation.

## 2015-05-07 NOTE — Progress Notes (Signed)
Patient ID: [name], male    DOB: 1930-07-08, 82 y.o.   MRN: [id number]  HPI 11/16/10- 47 year old man followed for severe chronic asthma, sleep apnea, allergic rhinitis, complicated by HBP CAD history of atrial fibrillation, GERD Last here May 20, 2010 CPAP 10 all night every night- helps sleep. Asks refill Provigil to help with long drives.  Allergy vaccine GO definitely helps- he is convinced.  He notes no asthma and no prednisone since last November- credits frequent handwashing, avoiding people with colds and the allergy vaccine. No new concerns. Continues cardiac rehab.   03/03/11-  37 year old man followed for severe chronic asthma, sleep apnea, allergic rhinitis, complicated by HBP CAD history of atrial fibrillation, GERD. Wife here. He recently had his fourth hospital stay since March, for gallbladder pain, cholecystectomy, cardiac stent. Cholecystectomy was 2 weeks ago. Through these he had shortness of breath and cough which has persisted and keep him awake. His nose runs.  07/03/11- 33 year old man followed for severe chronic asthma, sleep apnea, allergic rhinitis, complicated by HBP, CAD, history of atrial fibrillation, GERD. Wife here. Since last here has been to urgent care in emergency room 4 times for exacerbations of asthma. On prednisone 10 mg/day since February from Dr [name]. Definitely having reflux events and we discussed how that may be the significant issue. Denies sinus infection. Had chest x-rays in January and February which she was told were negative. He asks to try increasing his CPAP pressure because he doesn't feel he is sleeping quite well enough. We discussed this.  07/18/11- 11 year old man followed for severe chronic asthma, sleep apnea, allergic rhinitis, complicated by HBP, CAD, history of atrial fibrillation, GERD. Wife here. Seen by Dr Daub-Increased SOB and wheezing; was put on prednisone to help keep patient out of hospital; still not feeling  much better. Has been on prednisone 60 mg daily for past 4 days but still significant shortness of breath with exertion and wheeze. No excess 2 chest x-rays have been clear and EKG "knot heart". Using home nebulizer with DuoNeb 4 times daily plus his rescue inhaler at least twice per day. Nothing seems infected. He has not recognized heartburn although that has been an issue in the past. Denies chest pain, fever, ankle edema or palpitation. Some nasal congestion without much sneezing. Has been indoors without much exposure to pollen.  08/08/11- 44 year old man followed for severe chronic asthma, sleep apnea, allergic rhinitis, complicated by HBP, CAD, history of atrial fibrillation, GERD. Wife here. Hospitalized April 11-17 with acute exacerbation of his chronic fixed asthma. Anxiety and reflux are thought to be important. He says he is "doing great" now. He feels an anxiety medication would help and we discussed this. Currently on prednisone 20 mg daily.  10/04/11-  22 year old man followed for severe chronic asthma, sleep apnea, allergic rhinitis, complicated by HBP, CAD, history of atrial fibrillation, GERD. Wife here  Pt states increase of sob,wheezing,fatigue since getting out of hospital . No hospitalizations since last here. He stays short of breath especially with exertional dyspnea but is comfortable sitting and when lying in bed. His wife points out he is able to do yard work using a [employer]. We reviewed Dr.Hochrein's cardiology note. Question, , each of his dyspnea is pulmonary versus cardiac. We were asked to draw labs at this visit. He was criticized for using his nebulizer machine too frequently before his hospitalization but has only used it a total of 7 times since then. He had remained on maintenance prednisone  since January. His wife gradually tapered it off so he has had none in the last 3 weeks. He continues, and wants to continue, allergy vaccine at 1:10, well tolerated. CXR 07/22/11-    reviewed with them: Findings:  Grossly unchanged enlarged cardiac silhouette and mediastinal  contours post median sternotomy and CABG. Atherosclerotic  calcification within the thoracic aorta. There is persistent mild  elevation of the right hemidiaphragm. Grossly unchanged bibasilar  heterogeneous opacities favored to represent atelectasis. Grossly  unchanged bones including mild compression deformity of a lower  thoracic vertebral body.  IMPRESSION:  No acute cardiopulmonary disease.  Original Report Authenticated By: Rachel Moulds, M.D.  Office Spirometry: FEV1 1.40/58%, FVC 2.44/78%, FEV1/FVC 0.57/73%, FEF 25-75% 0.56/23%.  01/08/12- 89 year old man followed for severe chronic asthma, sleep apnea, allergic rhinitis, complicated by HBP, CAD, history of atrial fibrillation, GERD. Wife here Needs refill for rescue inhaler as he is leaving for beach in am; breathing is doing pretty well; only one treatment since seeing [place] last. . Asks for an antibiotic to carry.  05/10/2012 Acute OV Complains of increased dyspnea , coughing and wheezing. This woke him up this morning and used his neb without relief then used his inhaler without any relief.  Dry cough and wheezing started last pm.  Patient denies any hemoptysis, orthopnea, PND, leg swelling, or calf pain. Patient reports that wheezes. Started 2-3 days ago and worsened. Last night. Prior to going to bed. He had started on prednisone earlier in the week. at 10 mg and is currently on 5 mg daily .  He had his rescue inhaler this am with some relief.  05/30/12- 36 year old man followed for severe chronic asthma, sleep apnea, allergic rhinitis, complicated by HBP, CAD, history of atrial fibrillation, GERD. Wife here He says he is a well now, off prednisone x2 weeks. Likes Mucinex DM. We discussed steroidsand I suggested bone density assessment. We discussed steroid sparing availability of Daliresp trial is frequency of bronchitic exacerbations.   He continues using CPAP 12/ [facility] with no concerns.  09/05/12- 5 year old man followed for severe chronic asthma, sleep apnea, allergic rhinitis, complicated by HBP, CAD, history of atrial fibrillation, GERD.     Wife here FOLLOWS FOR: had to go to UC on 08-26-12 due to breathing issues. Was given shot, neb tx, and pred taper. Had caught a cold while fishing, leading to exacerbation. Finish prednisone taper 2 days ago. Feels "great" now. Allergy vaccine 1:10 GO CPAP 12/[facility] with good compliance and control CXR 08/26/12 IMPRESSION:  No active disease.  Original Report Authenticated By: Vallery Ridge, M.D.  11/04/16 -20 year old man followed for severe chronic asthma, sleep apnea, allergic rhinitis, complicated by HBP, CAD, history of atrial fibrillation/ pacemaker , GERD.     Wife here( She has mitochondrial myopathy) FOLOWS FOR: reports has been having diff w wheezing denies any chest tightness-- states he has been  seen in [place] clinic every month for breathing and per that MD patient may need some medication readjustment   wheeze and shortness of breath, sometimes green mucus. Gets better with neb been steroid but then relapses each time. Uses home nebulizer 3 times daily but doesn't last. Blamed Advair for 2 episodes of pneumonia-discussed. They feel he is doing well with allergy vaccine 1: 10 GO. OSA- good compliance and control with CPAP 12/ [facility]..  03/12/13- 76 year old man followed for severe chronic asthma, sleep apnea, allergic rhinitis, complicated by HBP, CAD, history of atrial fibrillation/ pacemaker , GERD.     (Wife  has mitochondrial myopathy) FOLLOWS FOR: since being on Dulera 100 has not had any flare ups or had to use nebulizer machine. Recent hospital stay at [facility] for heart issues- AFib/ tachycardia.  Feels "nervous, chilly" but not sick. Little cough or wheeze. Has been able to cut way down on nebulizer and w/o need recent prednisone. Ankles swell. Note med list shows  both diltiazem and Norvasc.  07/29/13- 78 year old M former smoker(120 pk yr) followed for severe chronic asthma/ COPD, OSA/CPAP, allergic rhinitis, complicated by HBP, CAD, history of AFib/ pacemaker , GERD.     (Wife  has mitochondrial myopathy) Allergy vaccine 1:10 GO     CPAP 12/ [facility]    FOLLOWS FOR: has been in hospital 3 times since Dec 2014. Pt has had flare ups with COPD. Breathing now is at baseline. Going to gym for exercise twice weekly. He likes [employer] Would still like to keep a few Nuvigil tablets around for long drives- discussed. He asks about updating pneumonia vaccine. He is at very high risk for pneumonia and complications. We compared Pneumovax-23 with Prevnar 13 and guidelines. CXR 06/14/13 IMPRESSION:  Findings concerning for right perihilar and infrahilar pneumonia.  Resolution of the previously seen left pneumonia.  Electronically Signed  By: Rolm Baptise M.D.  On: 06/14/2013 13:51 Office spirometry 07/29/2013- moderate obstructive airways disease-FVC 2.50/81%, FEV1 1.62/68%, FEV1/FVC 0.65, FEF 25-75% 0.90/39%.  11/25/13- 14 year old M former smoker(120 pk yr) followed for severe chronic asthma/ COPD, OSA/CPAP, allergic rhinitis, complicated by HBP, CAD, history of AFib/ pacemaker , GERD.     (Wife  has mitochondrial myopathy) FOLLOWS WUJ:[id number] good since on Dulera (in [place]),no cough,no wheezing,sleeps well,CPAP works well Allergy vaccine 1:10 GO     CPAP 12/ [facility]    01/08/14-  48 year old M former smoker(120 pk yr) followed for severe chronic asthma/ COPD, OSA/CPAP, allergic rhinitis, complicated by HBP, CAD/ CABG, history of AFib/ pacemaker , GERD, recurrent aspiration pneumonia.      (Wife here-  has mitochondrial myopathy) Post Hospital-01/01/14 through 01/04/14 for [facility] PNA c/w aspiration Swallowing eval showed mild dysphagia- need chin tuck Ba swallow 01/08/14- confirmed reflux into lower 1/3 esophagus, esophageal stricture  Allergy vaccine 1:10 GO     CPAP  12/ [facility] CXR 01/01/14 IMPRESSION:  1. Persistent right lower lobe infiltrate.  2. Prior CABG. Mild cardiomegaly, no pulmonary venous congestion.  Electronically Signed  By: Marcello Moores Register  On: 01/01/2014 10:25  05/13/14- 87 year old M former smoker(120 pk yr) followed for severe chronic asthma/ COPD, OSA/CPAP, allergic rhinitis, complicated by HBP, CAD/ CABG, history of AFib/ pacemaker , GERD, recurrent aspiration pneumonia. FOLLOW FOR:  allergies; pt currently has shingles.  Wife here Breathing feels fairly well controlled now. No prednisone in a year GERD controlled better. Chin tuck helps swallow without cough.  09/11/14- 51 year old M former smoker(120 pk yr) followed for severe chronic asthma/ COPD, OSA/CPAP, allergic rhinitis, complicated by HBP, CAD/ CABG, history of AFib/ pacemaker , GERD, recurrent aspiration pneumonia. FOLLOWS FOR: Pt states he is doing well with breathing; has had issues several times with BP.  Wears CPAP 12/ [facility] every night and at nap time-[facility]) no dl recently. Continues allergy vaccine 1:10 GO   wife here His breathing is doing well with no recent exacerbation. Little cough and very little wheeze, no phlegm. Is mostly concerned about elevated blood pressures and is following with cardiology. Some question about renal function which she will discuss with Dr [name].  01/04/15- 30 year old M former smoker(120 pk yr) followed for severe chronic  asthma/ COPD, OSA/CPAP, allergic rhinitis, complicated by HBP, CAD/ CABG/ stent,  history of AFib/ pacemaker , GERD, recurrent aspiration pneumonia. Allergy vaccine 1:10 GO    CPAP 12/ [facility] FOLLOWS FOR Pt states breathing unchanged since last visit. Wears CPAP every night and at nap times. Continues with allergy vaccine    Wife here Another stent palced. CT chest reviewed CT a chest 11/25/14 IMPRESSION: 1. No evidence of pulmonary embolism or other acute disease. 2. Emphysema and advanced atherosclerosis. Electronically  Signed  By: Monte Fantasia M.D.  On: 11/25/2014 02:11  05/07/2015-82 year old male former smoker Pred's is 120 pack year) followed for severe chronic asthma/COPD, OSA/CPAP, allergic rhinitis, complicated by HBP, CAD/CABG/stent, history A. fib/pacemaker, GERD, recurrent aspiration pneumonia Allergy vaccine 1:10 GO    CPAP 12/ [facility] FOLLOWS FOR: Pt has been on abx's and prednisone several times for about 1 month.  Still on avelox, just finished prednisone today Bronchitis after Christmas-started antibiotic and prednisone then changed Avelox 3 weeks ago with 3 more days to go. He has felt very clear for days, but every day when he takes Avelox he feels bad for an hour or 2-malaise. CXR 04/13/2015- I reviewed images and note follow-up chest x-ray is already pending IMPRESSION: Patchy interstitial infiltrate versus subsegmental atelectasis at both lung bases. Followup PA and lateral chest X-ray is recommended in 3-4 weeks following trial of antibiotic therapy to ensure resolution and exclude underlying malignancy. Electronically Signed  By: David Martinique M.D.  On: 04/13/2015 16:02    ROS-see HPI Constitutional:   No-   weight loss, night sweats, fevers, chills, fatigue, lassitude. HEENT:   No-  headaches, difficulty swallowing, tooth/dental problems, sore throat,       No-  sneezing, itching, ear ache, nasal congestion, post nasal drip,  CV:  No-   chest pain, orthopnea, PND, swelling in lower extremities, anasarca, dizziness, palpitations Resp: +shortness of breath with exertion or at rest.              No-   productive cough,  + non-productive cough,  No- coughing up of blood.              No-change in color of mucus.  No- wheezing.   Skin: No-   rash or lesions. GI:  No-   heartburn, indigestion, abdominal pain, nausea, vomiting,  GU:  MS:  No-   joint pain or swelling.   Neuro-     nothing unusual Psych:  No- change in mood or affect. No depression or anxiety.  No memory  loss.  Objective:  OBJ- Physical Exam General- Alert, Oriented, Affect-appropriate, Distress- none acute Skin- + staining on forearms from old ecchymoses Lymphadenopathy- none Head- atraumatic            Eyes- Gross vision intact, PERRLA, conjunctivae and secretions clear            Ears- + Hearing aid            Nose- Clear, no-Septal dev, mucus, polyps, erosion, perforation             Throat- Mallampati III , mucosa clear , drainage- none, tonsils- atrophic Neck- flexible , trachea midline, no stridor , thyroid nl, carotid no bruit Chest - symmetrical excursion , unlabored           Heart/CV- RRR ,murmur+2/6 syst , no gallop  , no rub, nl s1 s2                           -  JVD- none , edema-none, stasis changes- none, varices- none           Lung- +Clear/distant, wheeze- none, unlabored, cough- none , dullness-none, rub- none           Chest wall- Hyperinflated barrel chest Abd-  Br/ Gen/ Rectal- Not done, not indicated Extrem- cyanosis- none, clubbing, none, atrophy- none, strength- nl Neuro- grossly intact to observation. No tremor

## 2015-05-07 NOTE — Patient Instructions (Addendum)
Ok to stop Avelox now  Order- CXR  Dx COPD, recent pneumonia

## 2015-05-07 NOTE — Assessment & Plan Note (Signed)
Continuing allergy vaccine. Denies related problems ahead of spring pollen season.

## 2015-05-11 ENCOUNTER — Ambulatory Visit (INDEPENDENT_AMBULATORY_CARE_PROVIDER_SITE_OTHER): Payer: Medicare Other | Admitting: Internal Medicine

## 2015-05-11 ENCOUNTER — Encounter: Payer: Self-pay | Admitting: *Deleted

## 2015-05-11 VITALS — BP 164/84 | HR 93 | Temp 98.7°F | Resp 16 | Ht 61.0 in | Wt 147.0 lb

## 2015-05-11 DIAGNOSIS — K121 Other forms of stomatitis: Secondary | ICD-10-CM | POA: Diagnosis not present

## 2015-05-11 MED ORDER — LIDOCAINE VISCOUS 2 % MT SOLN: 20.0000 mL | OROMUCOSAL | Status: DC | PRN

## 2015-05-11 NOTE — Progress Notes (Signed)
[facility] [address] [place] [address] [phone]  Date:  05/11/2015   Name:  [name].   DOB:  08-Mar-1931   MRN:  [id number]  PCP:  Binnie Rail, MD    Chief Complaint: gums ache   History of Present Illness:  This is a 82 y.o. male with PMH MI, CHF, CAD, PAF, COPD, HLD, PAD who is presenting stating his lower gums hurt x 1 month. States pain started after being put on an antibiotic for pneumonia. He can't remember the name of the antibiotic. He was on this for 2 weeks and eventually switched to moxi d/t ineffectiveness. He was on this for another 2 weeks. He stopped this 5 days ago. Only left and right lower sides hurt. Bottom front does not hurt. He states the pain is starting to make him depressed, esp since he can't eat what he wants to eat. Pain is staying the same, no better no worse. He has been applying oragel and helps the pain temporarily. He has top dentures. He is edentulous on the bottom and does not use dentures - states he was told he does not have enough gums on the bottom for dentures. He is not taking medication for osteoporosis. He is otherwise feeling ok.  BP elevated to 186/80. Pt states his BP is always elevated. A recent note from his PCP Dr. Billey Gosling states: "He is taking his medication daily. He is compliant with a low sodium diet. He denies chest pain, palpitations, edema, shortness of breath and regular headaches. He is not exercising regularly. His BP is not controlled and cardiology has tried several medications, but nothing has helped."  Review of Systems:  Review of Systems See HPI  Patient Active Problem List   Diagnosis Date Noted  . Cough 04/13/2015  . PAD (peripheral artery disease) ([place]) 02/22/2015  . Subclavian artery stenosis, left 12/23/2014  . NSTEMI (non-ST elevated myocardial infarction) ([place])   . Chest pain 11/25/2014  . Tachycardia 11/24/2014  . Hx of CABG 2008 08/31/2014  . CAD S/P S-PDA DES  2012 08/31/2014  . PVD (peripheral vascular disease) ([place]) 08/31/2014  . Murmur 08/31/2014  . Anxiety about health 08/31/2014  . Edema 07/27/2014  . Hypertensive urgency 07/20/2014  . Accelerated hypertension 04/22/2014  . Abnormal chest x-ray 03/26/2014  . Abnormal CT scan, esophagus 01/07/2014  . Generalized anxiety disorder   . Anemia, iron deficiency   . PAF (paroxysmal atrial fibrillation) ([place]) 03/08/2013  . Chronic diastolic heart failure ([place]) 10/30/2012  . Osteoporosis/severe osteopenia 06/12/2012  . Bruit of left carotid artery 09/27/2011  . GERD 11/19/2009  . Hyperlipidemia with target LDL less than 100 10/04/2009  . ESOPHAGEAL STRICTURE 12/03/2007  . Coronary atherosclerosis 05/10/2007  . Seasonal and perennial allergic rhinitis 05/10/2007  . Asthma with COPD ([place]) 05/10/2007  . Obstructive sleep apnea 05/10/2007    Prior to Admission medications   Medication Sig Start Date End Date Taking? Authorizing Provider  acetaminophen (TYLENOL) 325 MG tablet Take 325 mg by mouth every 6 (six) hours as needed for moderate pain or headache.   Yes Historical Provider, MD  albuterol (PROVENTIL HFA;VENTOLIN HFA) 108 (90 BASE) MCG/ACT inhaler Inhale 2 puffs into the lungs every 6 (six) hours as needed for wheezing or shortness of breath. 06/24/14  Yes Deneise Lever, MD  apixaban (ELIQUIS) 5 MG TABS tablet Take 1 tablet (5 mg total) by mouth 2 (two) times daily. 10/19/14  Yes Mateo Flow  A Leschber, MD  budesonide (PULMICORT) 0.25 MG/2ML nebulizer solution Take 0.25 mg by nebulization at bedtime. For asthma   Yes Historical Provider, MD  calcium-vitamin D (OSCAL WITH D) 500-200 MG-UNIT per tablet Take 1 tablet by mouth 2 (two) times daily.   Yes Historical Provider, MD  cholestyramine (QUESTRAN) 4 G packet [place] 1 PACKET BY MOUTH TWICE A DAY IF NEEDED FOR LOOSE STOOLS 04/02/15  Yes Irene Shipper, MD  cloNIDine (CATAPRES) 0.3 MG tablet take 1 tablet by mouth twice a day 05/07/15  Yes  Binnie Rail, MD  clopidogrel (PLAVIX) 75 MG tablet Take 1 tablet (75 mg total) by mouth daily with breakfast. 11/27/14  Yes Charlynne Cousins, MD  DULERA 100-5 MCG/ACT AERO inhale 2 puffs by mouth once daily (THEN RINSE MOUTH AFTER USE) 07/14/14  Yes Rowe Clack, MD  ipratropium-albuterol (DUONEB) 0.5-2.5 (3) MG/3ML SOLN USE ONE VIAL VIA NEBULIZER EVERY 6 HOURS AS NEEDED FOR SHORTNESS OF BREATH 12/17/14  Yes Deneise Lever, MD  isosorbide mononitrate (IMDUR) 60 MG 24 hr tablet Take 1 tablet (60 mg total) by mouth daily. 04/22/14  Yes Rowe Clack, MD  latanoprost (XALATAN) 0.005 % ophthalmic solution Place 1 drop into both eyes at bedtime.    Yes Historical Provider, MD  LORazepam (ATIVAN) 0.5 MG tablet Take 1 tablet (0.5 mg total) by mouth 3 (three) times daily. 09/10/14  Yes Rowe Clack, MD  losartan (COZAAR) 100 MG tablet Take 1 tablet (100 mg total) by mouth every evening. 10/08/14  Yes Minus Breeding, MD  metoprolol tartrate (LOPRESSOR) 100 MG tablet Take 1 tablet (100 mg total) by mouth 2 (two) times daily. 02/25/15  Yes Bhavinkumar Bhagat, PA  montelukast (SINGULAIR) 10 MG tablet Take 1 tablet (10 mg total) by mouth at bedtime. 11/11/14  Yes Rowe Clack, MD         nitroGLYCERIN (NITROSTAT) 0.4 MG SL tablet Place 1 tablet (0.4 mg total) under the tongue every 5 (five) minutes as needed for chest pain. 07/13/14  Yes Minus Breeding, MD  NONFORMULARY OR COMPOUNDED ITEM Allergy Vaccine 1:10 Given at Home   Yes Historical Provider, MD  pantoprazole (PROTONIX) 40 MG tablet take 1 tablet by mouth twice a day Patient taking differently: take 40 mg by mouth twice a day 01/01/15  Yes Minus Breeding, MD  pravastatin (PRAVACHOL) 80 MG tablet take 1 tablet by mouth every morning 04/20/15  Yes Minus Breeding, MD  Probiotic Product ([place]) Take 1 capsule by mouth every evening.    Yes Historical Provider, MD  RA VITAMIN B-12 TR 1000 MCG TBCR take 1 tablet by mouth once  daily Patient taking differently: take 1000 mcg by mouth once daily 01/23/14  Yes Rowe Clack, MD    Allergies  Allergen Reactions  . Ace Inhibitors Other (See Comments)    Severe asthma (COPD)  . Avelox [Moxifloxacin Hcl In Nacl] Other (See Comments)    Gum pain  . Morphine And Related Nausea And Vomiting  . Codeine Nausea And Vomiting  . Lasix [Furosemide] Itching  . Other Other (See Comments)    Maple trees, allergy symptoms    Past Surgical History  Procedure Laterality Date  . Coronary artery bypass graft  02/2007  . Tonsillectomy    . Coronary angioplasty with stent placement  07/2010  . Cataract extraction, bilateral  2007  . Hernia repair  unsure ?60's  . Cholecystectomy  02/21/2011    Procedure: LAPAROSCOPIC  CHOLECYSTECTOMY WITH INTRAOPERATIVE CHOLANGIOGRAM;  Surgeon: Pedro Earls, MD;  Location: [facility];  Service: General;  Laterality: N/A;  . Cardiac catheterization N/A 11/26/2014    Procedure: Left Heart Cath and Cors/Grafts Angiography;  Surgeon: Sherren Mocha, MD;  Location: [facility];  Service: Cardiovascular;  Laterality: N/A;  . Cardiac catheterization N/A 11/26/2014    Procedure: Coronary Stent Intervention;  Surgeon: Sherren Mocha, MD;  Location: [place];  Service: Cardiovascular;  Laterality: N/A;  . Peripheral vascular catheterization N/A 02/22/2015    Procedure: Upper Extremity Angiography;  Surgeon: Lorretta Harp, MD;  Location: [place] [facility];  Service: Cardiovascular;  Laterality: N/A;    Social History  Substance Use Topics  . Smoking status: Former Smoker -- 3.00 packs/day for 40 years    Types: Cigarettes    Quit date: 04/11/1979  . Smokeless tobacco: Never Used  . Alcohol Use: No     Comment: quit drinking 40+ years    Family History  Problem Relation Age of Onset  . COPD Sister   . Asthma Sister   . Emphysema Sister   . Hypertension Sister   . Hyperlipidemia Sister   . Hypertension Mother   . Heart  disease Mother   . Hyperlipidemia Mother   . Colon cancer Neg Hx   . Heart disease Brother   . Heart disease Father   . Hyperlipidemia Father   . Hypertension Father     Medication list has been reviewed and updated.  Physical Examination:  Physical Exam  Constitutional: He is oriented to person, place, and time. He appears well-developed and well-nourished. No distress.  HENT:  Head: Normocephalic and atraumatic.  Right Ear: External ear normal.  Left Ear: External ear normal.  Nose: Nose normal.  Mouth/Throat: Uvula is midline, oropharynx is clear and moist and mucous membranes are normal.  Hearing aids in place  Upper dentures in place. Edentulous on lower jaw. 1 cm linear ulceration posterior left inner gingiva. Annular ulceration mid right sided gingiva. Exquisitely tender over these areas. General tenderness on right and left gingiva. No tenderness over lower anterior gingiva. Tongue and oropharynx clear.  No facial or exterior jaw tenderness.  Eyes: Conjunctivae and lids are normal. Right eye exhibits no discharge. Left eye exhibits no discharge. No scleral icterus.  Cardiovascular: Normal rate, regular rhythm and normal pulses.   Murmur present  Pulmonary/Chest: Effort normal and breath sounds normal. No respiratory distress.  Musculoskeletal: Normal range of motion.  Lymphadenopathy:       Head (right side): No submental, no submandibular and no tonsillar adenopathy present.       Head (left side): No submental, no submandibular and no tonsillar adenopathy present.    He has no cervical adenopathy.  Neurological: He is alert and oriented to person, place, and time.  Skin: Skin is warm, dry and intact. No lesion and no rash noted.  Psychiatric: He has a normal mood and affect. His speech is normal and behavior is normal. Thought content normal.    BP 186/80 mmHg  Pulse 93  Temp(Src) 98.7 F (37.1 C) (Oral)  Resp 16  Ht 5\' 1"  (1.549 m)  Wt 147 lb (66.679 kg)   BMI 27.79 kg/m2  SpO2 97%  Assessment and Plan:  1. Mouth ulcer Ulcerations on left and right lower gums. This has been going on for 1 month and not getting better. Will prescribe viscous lidocaine for symptomatic relief. Since ulcers not getting better, suggested he make  appt with periodontist for further evaluation. Referred to [facility]. Advised he call their office to make appt. Return as needed. - lidocaine (XYLOCAINE) 2 % solution; Use as directed 20 mLs in the mouth or throat as needed for mouth pain.  Dispense: 100 mL; Refill: 0 - Ambulatory referral to Periodontics  Discussed case with Dr. Laney Pastor.  Benjaman Pott Drenda Freeze, MHS [facility]  05/14/2015 I have participated in the care of this patient with the Advanced Practice Provider and agree with Diagnosis and Plan as documented. Eustacio P. Laney Pastor, M.D.

## 2015-05-11 NOTE — Patient Instructions (Signed)
(  [phone] [id number] [facility] I will place referral  Use xylocaine every couple hours as needed for pain.

## 2015-05-13 ENCOUNTER — Telehealth: Payer: Self-pay | Admitting: Emergency Medicine

## 2015-05-13 MED ORDER — ISOSORBIDE MONONITRATE ER 60 MG PO TB24: 120.0000 mg | ORAL_TABLET | Freq: Every day | ORAL | Status: DC

## 2015-05-13 MED ORDER — VALSARTAN 320 MG PO TABS: 320.0000 mg | ORAL_TABLET | Freq: Every day | ORAL | Status: DC

## 2015-05-13 NOTE — Telephone Encounter (Signed)
Pts wife called in with the concern of pt's BP. After pts visit on the 27th. His BP is running 204/104 in the morning and will go down to 170/93 after taking morning med. His BP sometimes spikes in the afternoon and will have to take his night time meds early. Pt has refused to go to hospital about this issue. I have stressed to him that it is important to go to the hospital if his BP continues to run this high over the weekend. Please advise if meds need to be changed or increased and if he needs another office visit soon?

## 2015-05-13 NOTE — Telephone Encounter (Signed)
Discontinue losartan 100 mg daily  And start valsartan 320 mg daily (sent to [facility])  Also  Increase imdur to 120mg  daily (double dose)  (new rx sent to [facility])  Continue all other medications.  Call early next week with an update.  F/u with me after I return from vacation.

## 2015-05-14 NOTE — Telephone Encounter (Signed)
Spoke with pts wife to inform. Appt has been made for the end of the month.

## 2015-05-17 ENCOUNTER — Emergency Department (HOSPITAL_COMMUNITY): Payer: Medicare Other

## 2015-05-17 ENCOUNTER — Inpatient Hospital Stay (HOSPITAL_COMMUNITY)
Admission: EM | Admit: 2015-05-17 | Discharge: 2015-05-19 | DRG: 302 | Disposition: A | Discharge status: Home Health | Payer: Medicare Other | Attending: Internal Medicine | Admitting: Internal Medicine

## 2015-05-17 ENCOUNTER — Encounter (HOSPITAL_COMMUNITY): Payer: Self-pay | Admitting: *Deleted

## 2015-05-17 ENCOUNTER — Telehealth: Payer: Self-pay | Admitting: Internal Medicine

## 2015-05-17 ENCOUNTER — Inpatient Hospital Stay (HOSPITAL_COMMUNITY): Payer: Medicare Other

## 2015-05-17 DIAGNOSIS — F329 Major depressive disorder, single episode, unspecified: Secondary | ICD-10-CM | POA: Diagnosis present

## 2015-05-17 DIAGNOSIS — E785 Hyperlipidemia, unspecified: Secondary | ICD-10-CM | POA: Diagnosis present

## 2015-05-17 DIAGNOSIS — Z885 Allergy status to narcotic agent status: Secondary | ICD-10-CM | POA: Diagnosis not present

## 2015-05-17 DIAGNOSIS — J45909 Unspecified asthma, uncomplicated: Secondary | ICD-10-CM | POA: Diagnosis not present

## 2015-05-17 DIAGNOSIS — G9340 Encephalopathy, unspecified: Secondary | ICD-10-CM | POA: Diagnosis not present

## 2015-05-17 DIAGNOSIS — I25119 Atherosclerotic heart disease of native coronary artery with unspecified angina pectoris: Principal | ICD-10-CM | POA: Diagnosis present

## 2015-05-17 DIAGNOSIS — Z951 Presence of aortocoronary bypass graft: Secondary | ICD-10-CM

## 2015-05-17 DIAGNOSIS — I639 Cerebral infarction, unspecified: Secondary | ICD-10-CM | POA: Diagnosis not present

## 2015-05-17 DIAGNOSIS — I480 Paroxysmal atrial fibrillation: Secondary | ICD-10-CM | POA: Diagnosis present

## 2015-05-17 DIAGNOSIS — J449 Chronic obstructive pulmonary disease, unspecified: Secondary | ICD-10-CM | POA: Diagnosis not present

## 2015-05-17 DIAGNOSIS — I252 Old myocardial infarction: Secondary | ICD-10-CM | POA: Diagnosis not present

## 2015-05-17 DIAGNOSIS — I350 Nonrheumatic aortic (valve) stenosis: Secondary | ICD-10-CM | POA: Diagnosis not present

## 2015-05-17 DIAGNOSIS — Z955 Presence of coronary angioplasty implant and graft: Secondary | ICD-10-CM | POA: Diagnosis not present

## 2015-05-17 DIAGNOSIS — G4733 Obstructive sleep apnea (adult) (pediatric): Secondary | ICD-10-CM | POA: Diagnosis present

## 2015-05-17 DIAGNOSIS — R200 Anesthesia of skin: Secondary | ICD-10-CM

## 2015-05-17 DIAGNOSIS — Z79899 Other long term (current) drug therapy: Secondary | ICD-10-CM

## 2015-05-17 DIAGNOSIS — I708 Atherosclerosis of other arteries: Secondary | ICD-10-CM | POA: Diagnosis not present

## 2015-05-17 DIAGNOSIS — M810 Age-related osteoporosis without current pathological fracture: Secondary | ICD-10-CM | POA: Diagnosis not present

## 2015-05-17 DIAGNOSIS — I6340 Cerebral infarction due to embolism of unspecified cerebral artery: Secondary | ICD-10-CM | POA: Insufficient documentation

## 2015-05-17 DIAGNOSIS — I5032 Chronic diastolic (congestive) heart failure: Secondary | ICD-10-CM | POA: Diagnosis not present

## 2015-05-17 DIAGNOSIS — I771 Stricture of artery: Secondary | ICD-10-CM | POA: Diagnosis present

## 2015-05-17 DIAGNOSIS — K222 Esophageal obstruction: Secondary | ICD-10-CM | POA: Diagnosis not present

## 2015-05-17 DIAGNOSIS — H409 Unspecified glaucoma: Secondary | ICD-10-CM | POA: Diagnosis present

## 2015-05-17 DIAGNOSIS — Z85828 Personal history of other malignant neoplasm of skin: Secondary | ICD-10-CM

## 2015-05-17 DIAGNOSIS — Z87891 Personal history of nicotine dependence: Secondary | ICD-10-CM

## 2015-05-17 DIAGNOSIS — Z888 Allergy status to other drugs, medicaments and biological substances status: Secondary | ICD-10-CM

## 2015-05-17 DIAGNOSIS — Z825 Family history of asthma and other chronic lower respiratory diseases: Secondary | ICD-10-CM | POA: Diagnosis not present

## 2015-05-17 DIAGNOSIS — Z7902 Long term (current) use of antithrombotics/antiplatelets: Secondary | ICD-10-CM | POA: Diagnosis not present

## 2015-05-17 DIAGNOSIS — E871 Hypo-osmolality and hyponatremia: Secondary | ICD-10-CM | POA: Diagnosis not present

## 2015-05-17 DIAGNOSIS — F411 Generalized anxiety disorder: Secondary | ICD-10-CM | POA: Diagnosis present

## 2015-05-17 DIAGNOSIS — R410 Disorientation, unspecified: Secondary | ICD-10-CM | POA: Diagnosis present

## 2015-05-17 DIAGNOSIS — Z7951 Long term (current) use of inhaled steroids: Secondary | ICD-10-CM | POA: Diagnosis not present

## 2015-05-17 DIAGNOSIS — I25118 Atherosclerotic heart disease of native coronary artery with other forms of angina pectoris: Secondary | ICD-10-CM | POA: Diagnosis not present

## 2015-05-17 DIAGNOSIS — D509 Iron deficiency anemia, unspecified: Secondary | ICD-10-CM | POA: Diagnosis not present

## 2015-05-17 DIAGNOSIS — K449 Diaphragmatic hernia without obstruction or gangrene: Secondary | ICD-10-CM | POA: Diagnosis present

## 2015-05-17 DIAGNOSIS — F1021 Alcohol dependence, in remission: Secondary | ICD-10-CM | POA: Diagnosis present

## 2015-05-17 DIAGNOSIS — R079 Chest pain, unspecified: Secondary | ICD-10-CM | POA: Diagnosis not present

## 2015-05-17 DIAGNOSIS — I10 Essential (primary) hypertension: Secondary | ICD-10-CM | POA: Insufficient documentation

## 2015-05-17 DIAGNOSIS — Z7901 Long term (current) use of anticoagulants: Secondary | ICD-10-CM

## 2015-05-17 DIAGNOSIS — R011 Cardiac murmur, unspecified: Secondary | ICD-10-CM | POA: Diagnosis present

## 2015-05-17 DIAGNOSIS — R202 Paresthesia of skin: Secondary | ICD-10-CM | POA: Diagnosis present

## 2015-05-17 DIAGNOSIS — I2510 Atherosclerotic heart disease of native coronary artery without angina pectoris: Secondary | ICD-10-CM | POA: Diagnosis not present

## 2015-05-17 DIAGNOSIS — K219 Gastro-esophageal reflux disease without esophagitis: Secondary | ICD-10-CM | POA: Diagnosis not present

## 2015-05-17 DIAGNOSIS — R0789 Other chest pain: Secondary | ICD-10-CM | POA: Diagnosis not present

## 2015-05-17 DIAGNOSIS — I214 Non-ST elevation (NSTEMI) myocardial infarction: Secondary | ICD-10-CM | POA: Diagnosis present

## 2015-05-17 DIAGNOSIS — I739 Peripheral vascular disease, unspecified: Secondary | ICD-10-CM | POA: Diagnosis present

## 2015-05-17 DIAGNOSIS — R0989 Other specified symptoms and signs involving the circulatory and respiratory systems: Secondary | ICD-10-CM | POA: Diagnosis present

## 2015-05-17 DIAGNOSIS — Z9861 Coronary angioplasty status: Secondary | ICD-10-CM

## 2015-05-17 DIAGNOSIS — J4489 Other specified chronic obstructive pulmonary disease: Secondary | ICD-10-CM | POA: Diagnosis present

## 2015-05-17 DIAGNOSIS — I63131 Cerebral infarction due to embolism of right carotid artery: Secondary | ICD-10-CM | POA: Diagnosis not present

## 2015-05-17 LAB — CBC
HCT: 36.8 % — ABNORMAL LOW (ref 39.0–52.0)
Hemoglobin: 12.6 g/dL — ABNORMAL LOW (ref 13.0–17.0)
MCH: 31.4 pg (ref 26.0–34.0)
MCHC: 34.2 g/dL (ref 30.0–36.0)
MCV: 91.8 fL (ref 78.0–100.0)
Platelets: 163 10*3/uL (ref 150–400)
RBC: 4.01 MIL/uL — ABNORMAL LOW (ref 4.22–5.81)
RDW: 14.6 % (ref 11.5–15.5)
WBC: 6.5 10*3/uL (ref 4.0–10.5)

## 2015-05-17 LAB — BASIC METABOLIC PANEL
Anion gap: 9 (ref 5–15)
BUN: 18 mg/dL (ref 6–20)
CO2: 25 mmol/L (ref 22–32)
Calcium: 8.8 mg/dL — ABNORMAL LOW (ref 8.9–10.3)
Chloride: 98 mmol/L — ABNORMAL LOW (ref 101–111)
Creatinine, Ser: 0.93 mg/dL (ref 0.61–1.24)
GFR calc Af Amer: >60 mL/min (ref >60)
GFR calc non Af Amer: >60 mL/min (ref >60)
Glucose, Bld: 111 mg/dL — ABNORMAL HIGH (ref 65–99)
Potassium: 3.8 mmol/L (ref 3.5–5.1)
Sodium: 132 mmol/L — ABNORMAL LOW (ref 135–145)

## 2015-05-17 LAB — I-STAT TROPONIN, ED: Troponin i, poc: 0.02 ng/mL (ref 0.00–0.08)

## 2015-05-17 LAB — URINALYSIS, ROUTINE W REFLEX MICROSCOPIC
BILIRUBIN URINE: NEGATIVE
GLUCOSE, UA: NEGATIVE mg/dL
HGB URINE DIPSTICK: NEGATIVE
Ketones, ur: NEGATIVE mg/dL
Leukocytes, UA: NEGATIVE
Nitrite: NEGATIVE
Protein, ur: NEGATIVE mg/dL
SPECIFIC GRAVITY, URINE: 1.007 (ref 1.005–1.030)
pH: 7 (ref 5.0–8.0)

## 2015-05-17 LAB — TROPONIN I: TROPONIN I: 0.03 ng/mL (ref <0.031)

## 2015-05-17 LAB — APTT: APTT: 29 s (ref 24–37)

## 2015-05-17 LAB — PROTIME-INR
INR: 1.11 (ref 0.00–1.49)
Prothrombin Time: 14.4 seconds (ref 11.6–15.2)

## 2015-05-17 MED ORDER — IRBESARTAN 300 MG PO TABS
300.0000 mg | ORAL_TABLET | Freq: Every day | ORAL | Status: DC
  Administered 2015-05-18 – 2015-05-19 (×2): 300 mg via ORAL
  Filled 2015-05-17 (×2): qty 1
  Filled 2015-05-17 (×2): qty 2

## 2015-05-17 MED ORDER — PROBIOTIC DAILY PO CAPS: 1.0000 | ORAL_CAPSULE | Freq: Every day | ORAL | Status: DC

## 2015-05-17 MED ORDER — BUDESONIDE 0.25 MG/2ML IN SUSP
0.2500 mg | Freq: Every day | RESPIRATORY_TRACT | Status: DC
  Administered 2015-05-17 – 2015-05-18 (×2): 0.25 mg via RESPIRATORY_TRACT
  Filled 2015-05-17 (×2): qty 2

## 2015-05-17 MED ORDER — ASPIRIN EC 81 MG PO TBEC
81.0000 mg | DELAYED_RELEASE_TABLET | Freq: Every day | ORAL | Status: DC
  Administered 2015-05-18: 81 mg via ORAL
  Filled 2015-05-17: qty 1

## 2015-05-17 MED ORDER — LIDOCAINE VISCOUS 2 % MT SOLN
20.0000 mL | OROMUCOSAL | Status: DC | PRN
  Filled 2015-05-17: qty 20

## 2015-05-17 MED ORDER — NITROGLYCERIN 0.4 MG SL SUBL: 0.4000 mg | SUBLINGUAL_TABLET | SUBLINGUAL | Status: DC | PRN

## 2015-05-17 MED ORDER — CHOLESTYRAMINE LIGHT 4 G PO PACK: 4.0000 g | PACK | Freq: Two times a day (BID) | ORAL | Status: DC | PRN

## 2015-05-17 MED ORDER — PRAVASTATIN SODIUM 40 MG PO TABS
80.0000 mg | ORAL_TABLET | Freq: Every morning | ORAL | Status: DC
  Administered 2015-05-18 – 2015-05-19 (×2): 80 mg via ORAL
  Filled 2015-05-17 (×2): qty 2

## 2015-05-17 MED ORDER — HYDRALAZINE HCL 20 MG/ML IJ SOLN: 5.0000 mg | INTRAMUSCULAR | Status: DC | PRN

## 2015-05-17 MED ORDER — ALBUTEROL SULFATE (2.5 MG/3ML) 0.083% IN NEBU: 2.5000 mg | INHALATION_SOLUTION | RESPIRATORY_TRACT | Status: DC | PRN

## 2015-05-17 MED ORDER — SODIUM CHLORIDE 0.9 % IV SOLN
INTRAVENOUS | Status: DC
  Administered 2015-05-17: 23:00:00 via INTRAVENOUS

## 2015-05-17 MED ORDER — MOMETASONE FURO-FORMOTEROL FUM 100-5 MCG/ACT IN AERO
2.0000 | INHALATION_SPRAY | Freq: Two times a day (BID) | RESPIRATORY_TRACT | Status: DC
  Administered 2015-05-18 – 2015-05-19 (×3): 2 via RESPIRATORY_TRACT
  Filled 2015-05-17: qty 8.8

## 2015-05-17 MED ORDER — CALCIUM CARBONATE-VITAMIN D 500-200 MG-UNIT PO TABS
1.0000 | ORAL_TABLET | Freq: Two times a day (BID) | ORAL | Status: DC
  Administered 2015-05-17 – 2015-05-19 (×4): 1 via ORAL
  Filled 2015-05-17 (×4): qty 1

## 2015-05-17 MED ORDER — STROKE: EARLY STAGES OF RECOVERY BOOK
Freq: Once | Status: AC
  Administered 2015-05-18: 05:00:00
  Filled 2015-05-17: qty 1

## 2015-05-17 MED ORDER — AMLODIPINE BESYLATE 5 MG PO TABS
5.0000 mg | ORAL_TABLET | Freq: Every day | ORAL | Status: DC
  Administered 2015-05-17 – 2015-05-19 (×3): 5 mg via ORAL
  Filled 2015-05-17 (×3): qty 1

## 2015-05-17 MED ORDER — CLOPIDOGREL BISULFATE 75 MG PO TABS
75.0000 mg | ORAL_TABLET | Freq: Every day | ORAL | Status: DC
  Administered 2015-05-18 – 2015-05-19 (×2): 75 mg via ORAL
  Filled 2015-05-17 (×2): qty 1

## 2015-05-17 MED ORDER — PANTOPRAZOLE SODIUM 40 MG PO TBEC
40.0000 mg | DELAYED_RELEASE_TABLET | Freq: Two times a day (BID) | ORAL | Status: DC
  Administered 2015-05-17 – 2015-05-19 (×4): 40 mg via ORAL
  Filled 2015-05-17 (×4): qty 1

## 2015-05-17 MED ORDER — METOPROLOL TARTRATE 100 MG PO TABS
100.0000 mg | ORAL_TABLET | Freq: Two times a day (BID) | ORAL | Status: DC
  Administered 2015-05-17 – 2015-05-19 (×4): 100 mg via ORAL
  Filled 2015-05-17 (×4): qty 1

## 2015-05-17 MED ORDER — VITAMIN B-12 1000 MCG PO TABS
1000.0000 ug | ORAL_TABLET | Freq: Every day | ORAL | Status: DC
  Administered 2015-05-17 – 2015-05-19 (×3): 1000 ug via ORAL
  Filled 2015-05-17 (×3): qty 1

## 2015-05-17 MED ORDER — CLONIDINE HCL 0.3 MG PO TABS
0.3000 mg | ORAL_TABLET | Freq: Two times a day (BID) | ORAL | Status: DC
  Administered 2015-05-17 – 2015-05-19 (×4): 0.3 mg via ORAL
  Filled 2015-05-17 (×4): qty 1

## 2015-05-17 MED ORDER — RISAQUAD PO CAPS
1.0000 | ORAL_CAPSULE | Freq: Every evening | ORAL | Status: DC
  Administered 2015-05-17 – 2015-05-18 (×2): 1 via ORAL
  Filled 2015-05-17 (×2): qty 1

## 2015-05-17 MED ORDER — ACETAMINOPHEN 325 MG PO TABS: 650.0000 mg | ORAL_TABLET | ORAL | Status: DC | PRN

## 2015-05-17 MED ORDER — LATANOPROST 0.005 % OP SOLN
1.0000 [drp] | Freq: Every day | OPHTHALMIC | Status: DC
  Administered 2015-05-17 – 2015-05-18 (×2): 1 [drp] via OPHTHALMIC
  Filled 2015-05-17: qty 2.5

## 2015-05-17 MED ORDER — IPRATROPIUM-ALBUTEROL 0.5-2.5 (3) MG/3ML IN SOLN: 3.0000 mL | Freq: Four times a day (QID) | RESPIRATORY_TRACT | Status: DC | PRN

## 2015-05-17 MED ORDER — LORAZEPAM 0.5 MG PO TABS
0.5000 mg | ORAL_TABLET | Freq: Three times a day (TID) | ORAL | Status: DC
  Administered 2015-05-17 – 2015-05-19 (×5): 0.5 mg via ORAL
  Filled 2015-05-17 (×5): qty 1

## 2015-05-17 MED ORDER — ISOSORBIDE MONONITRATE ER 60 MG PO TB24
120.0000 mg | ORAL_TABLET | Freq: Every day | ORAL | Status: DC
  Administered 2015-05-18: 120 mg via ORAL
  Filled 2015-05-17 (×2): qty 2

## 2015-05-17 MED ORDER — MONTELUKAST SODIUM 10 MG PO TABS
10.0000 mg | ORAL_TABLET | Freq: Every day | ORAL | Status: DC
  Administered 2015-05-17 – 2015-05-18 (×2): 10 mg via ORAL
  Filled 2015-05-17 (×2): qty 1

## 2015-05-17 MED ORDER — ASPIRIN 81 MG PO CHEW
324.0000 mg | CHEWABLE_TABLET | Freq: Once | ORAL | Status: AC
  Administered 2015-05-17: 324 mg via ORAL
  Filled 2015-05-17: qty 4

## 2015-05-17 MED ORDER — ONDANSETRON HCL 4 MG/2ML IJ SOLN
4.0000 mg | Freq: Three times a day (TID) | INTRAMUSCULAR | Status: DC | PRN
Start: 2015-05-17 — End: 2015-05-19

## 2015-05-17 NOTE — Progress Notes (Signed)
RN text paged Ivor Costa, MD via Amion to notify him that patient's MRI results have resulted.

## 2015-05-17 NOTE — ED Notes (Signed)
MRI to come get patient soon. Report called to [address], will send patient upstairs once he is back from MRI.

## 2015-05-17 NOTE — ED Provider Notes (Signed)
CSN: [id number]     Arrival date & time 05/17/15  1237 History   First MD Initiated Contact with Patient 05/17/15 1734     Chief Complaint  Patient presents with  . Chest Pain  . Hypertension     (Consider location/radiation/quality/duration/timing/severity/associated sxs/prior Treatment) HPI Comments: 82 year old male with extensive past medical history including CAD status post CABG, atrial fibrillation on Eliquis, hypertension, hyperlipidemia, CHF, COPD who presents with chest pain and hypertension. The patient and his wife report that his blood pressure has been elevated at home recently and his PCP has a been adjusting his medications. Recently, he was switched from Losartan to Valsartan and his imdur was doubled. He reports that 2 nights ago while sitting, he had a gradual onset of central chest pain that lasted a few minutes and was relieved by nitroglycerin. He was not sure whether it was heartburn as he also took Rolaids. The chest pain recurred last night and again improved with nitroglycerin. This morning around 4 AM, he began having central chest pain that lasted for a longer period of time but it spontaneously resolved. He denies any associated shortness of breath, nausea, or diaphoresis. He denies any vomiting or recent illness. He states that it is been a long time since he has had chest pain requiring nitroglycerin.  Wife also notes that he has intermittently had some confusion. She is concerned that it may be related to a topical medication that he has used for pain on his gumline. He denies any headaches, visual changes, or focal weakness.  Patient is a 82 y.o. male presenting with chest pain and hypertension. The history is provided by the patient.  Chest Pain Hypertension Associated symptoms include chest pain.    Past Medical History  Diagnosis Date  . CORONARY HEART DISEASE     a. s/p CABG;  b. cath 06/27/10: S-Dx occluded, S-PDA 80-90% (tx with PCI); S-OM ok, L-LAD ok;   EF 65-70%  c.  s/p Promus DES to S-PDA 06/2010;   d. Cath 8 2016 LIMA to the LAD patent, SVG to posterior lateral subtotal, SVG to diagonal occluded chronically. The patient had stenting of the native right vessel.  Marland Kitchen FIBRILLATION, ATRIAL   . SLEEP APNEA 09/2001    NPSG AHI 22/HR  . ASTHMA   . Depression   . GERD     with HH, hx esophageal stricture  . DYSLIPIDEMIA   . HYPERTENSION     Echo 3/12: EF 55-60%; mod LVH; mild AS/AI; LAE; PASP 38; mild pulmo HTN  . Personal history of alcoholism ([place])   . Diverticulosis   . Benign liver cyst   . Skin cancer     L forearm  . Cataract     surgery to both eyes  . Esophageal stricture   . Hiatal hernia   . Pneumonia 04/2013 hosp  . Anxiety   . Arthritis   . CHF (congestive heart failure) ([place])   . COPD (chronic obstructive pulmonary disease) ([place])   . Glaucoma   . Osteoporosis   . Subclavian arterial stenosis [facility])    Past Surgical History  Procedure Laterality Date  . Coronary artery bypass graft  02/2007  . Tonsillectomy    . Coronary angioplasty with stent placement  07/2010  . Cataract extraction, bilateral  2007  . Hernia repair  unsure ?60's  . Cholecystectomy  02/21/2011    Procedure: LAPAROSCOPIC CHOLECYSTECTOMY WITH INTRAOPERATIVE CHOLANGIOGRAM;  Surgeon: Pedro Earls, MD;  Location: [facility];  Service: General;  Laterality: N/A;  . Cardiac catheterization N/A 11/26/2014    Procedure: Left Heart Cath and Cors/Grafts Angiography;  Surgeon: Sherren Mocha, MD;  Location: [place] [facility];  Service: Cardiovascular;  Laterality: N/A;  . Cardiac catheterization N/A 11/26/2014    Procedure: Coronary Stent Intervention;  Surgeon: Sherren Mocha, MD;  Location: [facility];  Service: Cardiovascular;  Laterality: N/A;  . Peripheral vascular catheterization N/A 02/22/2015    Procedure: Upper Extremity Angiography;  Surgeon: Lorretta Harp, MD;  Location: [place] [facility];  Service: Cardiovascular;  Laterality: N/A;   Family  History  Problem Relation Age of Onset  . COPD Sister   . Asthma Sister   . Emphysema Sister   . Hypertension Sister   . Hyperlipidemia Sister   . Hypertension Mother   . Heart disease Mother   . Hyperlipidemia Mother   . Colon cancer Neg Hx   . Heart disease Brother   . Heart disease Father   . Hyperlipidemia Father   . Hypertension Father    Social History  Substance Use Topics  . Smoking status: Former Smoker -- 3.00 packs/day for 40 years    Types: Cigarettes    Quit date: 04/11/1979  . Smokeless tobacco: Never Used  . Alcohol Use: No     Comment: quit drinking 40+ years    Review of Systems  Cardiovascular: Positive for chest pain.   10 Systems reviewed and are negative for acute change except as noted in the HPI.    Allergies  Ace inhibitors; Avelox; Morphine and related; Codeine; Lasix; and Other  Home Medications   Prior to Admission medications   Medication Sig Start Date End Date Taking? Authorizing Provider  acetaminophen (TYLENOL) 325 MG tablet Take 325 mg by mouth every 6 (six) hours as needed for moderate pain or headache.    Historical Provider, MD  albuterol (PROVENTIL HFA;VENTOLIN HFA) 108 (90 BASE) MCG/ACT inhaler Inhale 2 puffs into the lungs every 6 (six) hours as needed for wheezing or shortness of breath. 06/24/14   Deneise Lever, MD  apixaban (ELIQUIS) 5 MG TABS tablet Take 1 tablet (5 mg total) by mouth 2 (two) times daily. 10/19/14   Rowe Clack, MD  budesonide (PULMICORT) 0.25 MG/2ML nebulizer solution Take 0.25 mg by nebulization at bedtime. For asthma    Historical Provider, MD  calcium-vitamin D (OSCAL WITH D) 500-200 MG-UNIT per tablet Take 1 tablet by mouth 2 (two) times daily.    Historical Provider, MD  cholestyramine (QUESTRAN) 4 G packet [place] 1 PACKET BY MOUTH TWICE A DAY IF NEEDED FOR LOOSE STOOLS 04/02/15   Irene Shipper, MD  cloNIDine (CATAPRES) 0.3 MG tablet take 1 tablet by mouth twice a day 05/07/15   Binnie Rail,  MD  clopidogrel (PLAVIX) 75 MG tablet Take 1 tablet (75 mg total) by mouth daily with breakfast. 11/27/14   Charlynne Cousins, MD  DULERA 100-5 MCG/ACT AERO inhale 2 puffs by mouth once daily (THEN RINSE MOUTH AFTER USE) 07/14/14   Rowe Clack, MD  ipratropium-albuterol (DUONEB) 0.5-2.5 (3) MG/3ML SOLN USE ONE VIAL VIA NEBULIZER EVERY 6 HOURS AS NEEDED FOR SHORTNESS OF BREATH 12/17/14   Deneise Lever, MD  isosorbide mononitrate (IMDUR) 60 MG 24 hr tablet Take 2 tablets (120 mg total) by mouth daily. 05/13/15   Binnie Rail, MD  latanoprost (XALATAN) 0.005 % ophthalmic solution Place 1 drop into both eyes at bedtime.     Historical  Provider, MD  lidocaine (XYLOCAINE) 2 % solution Use as directed 20 mLs in the mouth or throat as needed for mouth pain. 05/11/15   Ezekiel Slocumb, PA-C  LORazepam (ATIVAN) 0.5 MG tablet Take 1 tablet (0.5 mg total) by mouth 3 (three) times daily. 09/10/14   Rowe Clack, MD  metoprolol tartrate (LOPRESSOR) 100 MG tablet Take 1 tablet (100 mg total) by mouth 2 (two) times daily. 02/25/15   Bhavinkumar Bhagat, PA  montelukast (SINGULAIR) 10 MG tablet Take 1 tablet (10 mg total) by mouth at bedtime. 11/11/14   Rowe Clack, MD  moxifloxacin (AVELOX) 400 MG tablet Take 1 tablet (400 mg total) by mouth daily. 04/26/15   Binnie Rail, MD  nitroGLYCERIN (NITROSTAT) 0.4 MG SL tablet Place 1 tablet (0.4 mg total) under the tongue every 5 (five) minutes as needed for chest pain. 07/13/14   Minus Breeding, MD  NONFORMULARY OR COMPOUNDED ITEM Allergy Vaccine 1:10 Given at [place] Provider, MD  pantoprazole (PROTONIX) 40 MG tablet take 1 tablet by mouth twice a day Patient taking differently: take 40 mg by mouth twice a day 01/01/15   Minus Breeding, MD  pravastatin (PRAVACHOL) 80 MG tablet take 1 tablet by mouth every morning 04/20/15   Minus Breeding, MD  Probiotic Product ([place]) Take 1 capsule by mouth every evening.     Historical Provider,  MD  RA VITAMIN B-12 TR 1000 MCG TBCR take 1 tablet by mouth once daily Patient taking differently: take 1000 mcg by mouth once daily 01/23/14   Rowe Clack, MD  valsartan (DIOVAN) 320 MG tablet Take 1 tablet (320 mg total) by mouth daily. 05/13/15   Binnie Rail, MD   BP 145/62 mmHg  Pulse 85  Temp(Src) 98.3 F (36.8 C) (Oral)  Resp 16  SpO2 98% Physical Exam  Constitutional: He is oriented to person, place, and time. He appears well-developed and well-nourished. No distress.  Awake, alert  HENT:  Head: Normocephalic and atraumatic.  Eyes: Conjunctivae and EOM are normal. Pupils are equal, round, and reactive to light.  Neck: Neck supple.  Cardiovascular: Normal rate.   Irregularly irregular rhythm, 3/6 systolic murmur  Pulmonary/Chest: Effort normal and breath sounds normal. No respiratory distress.  Abdominal: Soft. Bowel sounds are normal. He exhibits no distension.  Musculoskeletal: He exhibits no edema.  Neurological: He is alert and oriented to person, place, and time. He has normal reflexes. No cranial nerve deficit. He exhibits normal muscle tone.  Fluent speech, normal finger-to-nose testing, negative pronator drift; mild tremor b/l hands; 5/5 strength and normal sensation x all 4 ext  Skin: Skin is warm and dry.  Psychiatric: He has a normal mood and affect. Judgment and thought content normal.  Nursing note and vitals reviewed.   ED Course  Procedures (including critical care time) Labs Review Labs Reviewed  BASIC METABOLIC PANEL - Abnormal; Notable for the following:    Sodium 132 (*)    Chloride 98 (*)    Glucose, Bld 111 (*)    Calcium 8.8 (*)    All other components within normal limits  CBC - Abnormal; Notable for the following:    RBC 4.01 (*)    Hemoglobin 12.6 (*)    HCT 36.8 (*)    All other components within normal limits  I-STAT TROPOININ, ED    Imaging Review Dg Chest 2 View  05/17/2015  CLINICAL DATA:  Chest pain 2 nights ago. EXAM:  CHEST   2 VIEW COMPARISON:  05/07/2015 FINDINGS: Prior CABG. Heart is upper limits normal in size. No confluent airspace opacities or effusions. Calcifications in the aorta. No acute bony abnormality. IMPRESSION: No active cardiopulmonary disease. Electronically Signed   By: Rolm Baptise M.D.   On: 05/17/2015 13:34   I have personally reviewed and evaluated these lab results as part of my medical decision-making.   EKG Interpretation   Date/Time:  Monday May 17 2015 12:47:08 EST Ventricular Rate:  107 PR Interval:    QRS Duration: 94 QT Interval:  346 QTC Calculation: 461 R Axis:   44 Text Interpretation:  Atrial fibrillation with rapid ventricular response  Septal infarct , age undetermined Abnormal ECG No significant change since  last tracing Confirmed by LITTLE MD, RACHEL [phone]) on 05/17/2015 5:35:18 PM     Medications  aspirin chewable tablet 324 mg (324 mg Oral Given 05/17/15 1804)     MDM   Final diagnoses:  None   Pt w/ extensive cardiac hx presents with several episodes of chest pain that occurred at rest while at home, responsive to nitroglycerin, as well as ongoing problems with hypertension. On exam, he was comfortable and in no acute distress. Initial vital signs notable for heart rate 110, his heart rate during my examination was normal, atrial fibrillation noted. Blood pressure elevated, which improved during ED stay. No obvious neurologic deficits on exam. EKG shows A. fib, no signal changes from previous. Chest x-ray unremarkable. Gave the patient aspirin and obtained above lab work including troponin. Labwork shows stable anemia and otherwise unremarkable.   Regarding whites reported intermittent confusion, the patient has no neurologic deficits on exam and is mentating appropriately during my examination. I will inform admitting team about this concern for consideration of imaging if the patient develops any other symptoms.  Given the patient's extensive cardiac history  and recurrent chest pain, I discussed with cardiology, Dr.Skanes. He will see the patient in consultation and recommended medicine admission for assistance with his other complaints. I discussed with medicine, Blaine Hamper, appreciate assistance. Pt admitted to medicine for further care.   Sharlett Iles, MD 05/17/15 [phone]

## 2015-05-17 NOTE — Telephone Encounter (Signed)
[facility]  Patient Name: [name]  DOB: 07-14-1930    Initial Comment Caller states her husband was having issues with his bp. She followed nurse direction and bp went down. Heart rate is up now. Ranges from 80-100   Nurse Assessment  Nurse: Arnetha Courser, RN, Susie Date/Time (Eastern Time): 05/17/2015 9:54:39 AM  Confirm and document reason for call. If symptomatic, describe symptoms. You must click the next button to save text entered. ---Caller states her husband was having issues with his b/p and on Friday - confused about which medications was told to take away; Heart rate is up and doesn't feel well today; Hospitalized in November for surgical procedure and had subsequent issues with hypertension; had episode with chest tightness;  Has the patient traveled out of the country within the last 30 days? ---Not Applicable  Does the patient have any new or worsening symptoms? ---Yes  Will a triage be completed? ---Yes  Related visit to physician within the last 2 weeks? ---N/A  Does the PT have any chronic conditions? (i.e. diabetes, asthma, etc.) ---No  Is this a behavioral health or substance abuse call? ---No     Guidelines    Guideline Title Affirmed Question Affirmed Notes  Chest Pain Chest pain lasts > 5 minutes (Exceptions: chest pain occurring > 3 days ago and now asymptomatic; same as previously diagnosed heartburn and has accompanying sour taste in mouth)    Final Disposition User   Go to ED Now (or PCP triage) Wisdom, RN, Susie    Comments  he states that when his heart rate increases that is when he has chest pain; heart rate increased during the call;   Referrals  [facility] - ED   Disagree/Comply: Comply

## 2015-05-17 NOTE — H&P (Addendum)
[facility] History and Physical  San Mansberger. RK:[id number] DOB: May 06, 1930 DOA: 05/17/2015  Referring physician: ED physician PCP: Binnie Rail, MD  Specialists:   Chief Complaint: chest pain, intermittent confusion and right hand numbness  HPI: [name]. is a 82 y.o. male with PMH of CAD, s/p of CABG 2008 and stent, hyperlipidemia, COPD, asthma, atrial fibrillation on Eliquis, OSA on CPAP, skin cancer, formal alcohol user, diastolic congestive heart failure, glaucoma, subclavian artery stenosis, carotid artery stenosis, who presents with chest pain, intermittent confusion and the right hand numbness.  Patient reports that he had 2 episodes of chest pain, one in yesterday, and another one happened in this morning at about 4 AM. The chest place was located in the substernal area, severe, nonradiating, nonpleuritic. Lasted for about 10 minutes. It was alleviated by nitroglycerin. Currently no chest pain. Patient has chronic mild cough and mild shortness of breath due to COPD and asthma, which have not changed. No tenderness over calf areas. Patient also reports that he has intermittent right hand numbness, which has been going on for several months. Sometimes he has right thumb contracture, feels like his right thumb is locked. He does not have weakness, numbness or tingling sensations in other extremities. No vision change or hearing loss. Per his wife, patient has intermittent confusion recently. Currently patient's mental status is okay. Patient does not have abdominal pain, diarrhea, symptoms of UTI, hematuria, hematochezia.  In ED, patient was found to have negative troponin, WBC 6.5, temperature normal, heart rate 80-110, sodium 132, potassium normal, renal function okay, negative chest x-ray for acute abnormalities. Patient's admitted to inpatient for further evaluation and treatment. Cardiology was consulted by EDP.   EKG: Independently reviewed. QTC 461, tachycardia,  atrial fibrillation, old septal infarction pattern.  Where does patient live?   At home    Can patient participate in ADLs?  some   Review of Systems:   General: no fevers, chills, no changes in body weight, has fatigue HEENT: no blurry vision, hearing changes or sore throat Pulm: has chronic mild dyspnea, coughing, no wheezing CV: has chest pain, no palpitations Abd: no nausea, vomiting, abdominal pain, diarrhea, constipation GU: no dysuria, burning on urination, increased urinary frequency, hematuria  Ext: no leg edema Neuro: has right hand numbness. No vision change or hearing loss Skin: no rash MSK: No muscle spasm, no deformity, no limitation of range of movement in spin Heme: No easy bruising.  Travel history: No recent long distant travel.  Allergy:  Allergies  Allergen Reactions  . Ace Inhibitors Other (See Comments)    Severe asthma (COPD)  . Avelox [Moxifloxacin Hcl In Nacl] Other (See Comments)    Gum pain  . Tape Other (See Comments)    Adhesive and tape reaction-tears skin off   . Lidocaine Other (See Comments)    Possible reaction? Dizziness, confusion, syncope  . Morphine And Related Nausea And Vomiting  . Codeine Nausea And Vomiting  . Lasix [Furosemide] Itching and Swelling  . Other Other (See Comments)    Maple trees, allergy symptoms    Past Medical History  Diagnosis Date  . CORONARY HEART DISEASE     a. s/p CABG;  b. cath 06/27/10: S-Dx occluded, S-PDA 80-90% (tx with PCI); S-OM ok, L-LAD ok;  EF 65-70%  c.  s/p Promus DES to S-PDA 06/2010;   d. Cath 8 2016 LIMA to the LAD patent, SVG to posterior lateral subtotal, SVG to diagonal occluded chronically. The patient  had stenting of the native right vessel.  Marland Kitchen FIBRILLATION, ATRIAL   . SLEEP APNEA 09/2001    NPSG AHI 22/HR  . ASTHMA   . Depression   . GERD     with HH, hx esophageal stricture  . DYSLIPIDEMIA   . HYPERTENSION     Echo 3/12: EF 55-60%; mod LVH; mild AS/AI; LAE; PASP 38; mild pulmo HTN   . Personal history of alcoholism ([place])   . Diverticulosis   . Benign liver cyst   . Skin cancer     L forearm  . Cataract     surgery to both eyes  . Esophageal stricture   . Hiatal hernia   . Pneumonia 04/2013 hosp  . Anxiety   . Arthritis   . CHF (congestive heart failure) ([place])   . COPD (chronic obstructive pulmonary disease) ([place])   . Glaucoma   . Osteoporosis   . Subclavian arterial stenosis [facility])     Past Surgical History  Procedure Laterality Date  . Coronary artery bypass graft  02/2007  . Tonsillectomy    . Coronary angioplasty with stent placement  07/2010  . Cataract extraction, bilateral  2007  . Hernia repair  unsure ?60's  . Cholecystectomy  02/21/2011    Procedure: LAPAROSCOPIC CHOLECYSTECTOMY WITH INTRAOPERATIVE CHOLANGIOGRAM;  Surgeon: Pedro Earls, MD;  Location: [facility];  Service: General;  Laterality: N/A;  . Cardiac catheterization N/A 11/26/2014    Procedure: Left Heart Cath and Cors/Grafts Angiography;  Surgeon: Sherren Mocha, MD;  Location: [facility];  Service: Cardiovascular;  Laterality: N/A;  . Cardiac catheterization N/A 11/26/2014    Procedure: Coronary Stent Intervention;  Surgeon: Sherren Mocha, MD;  Location: [facility];  Service: Cardiovascular;  Laterality: N/A;  . Peripheral vascular catheterization N/A 02/22/2015    Procedure: Upper Extremity Angiography;  Surgeon: Lorretta Harp, MD;  Location: [facility];  Service: Cardiovascular;  Laterality: N/A;    Social History:  reports that he quit smoking about 36 years ago. His smoking use included Cigarettes. He has a 120 pack-year smoking history. He has never used smokeless tobacco. He reports that he does not drink alcohol or use illicit drugs.  Family History:  Family History  Problem Relation Age of Onset  . COPD Sister   . Asthma Sister   . Emphysema Sister   . Hypertension Sister   . Hyperlipidemia Sister   . Hypertension Mother   . Heart disease Mother    . Hyperlipidemia Mother   . Colon cancer Neg Hx   . Heart disease Brother   . Heart disease Father   . Hyperlipidemia Father   . Hypertension Father      Prior to Admission medications   Medication Sig Start Date End Date Taking? Authorizing Provider  acetaminophen (TYLENOL) 325 MG tablet Take 325 mg by mouth every 6 (six) hours as needed for moderate pain or headache.    Historical Provider, MD  albuterol (PROVENTIL HFA;VENTOLIN HFA) 108 (90 BASE) MCG/ACT inhaler Inhale 2 puffs into the lungs every 6 (six) hours as needed for wheezing or shortness of breath. 06/24/14   Deneise Lever, MD  apixaban (ELIQUIS) 5 MG TABS tablet Take 1 tablet (5 mg total) by mouth 2 (two) times daily. 10/19/14   Rowe Clack, MD  budesonide (PULMICORT) 0.25 MG/2ML nebulizer solution Take 0.25 mg by nebulization at bedtime. For asthma    Historical Provider, MD  calcium-vitamin D (OSCAL WITH D) 500-200 MG-UNIT  per tablet Take 1 tablet by mouth 2 (two) times daily.    Historical Provider, MD  cholestyramine (QUESTRAN) 4 G packet [place] 1 PACKET BY MOUTH TWICE A DAY IF NEEDED FOR LOOSE STOOLS 04/02/15   Irene Shipper, MD  cloNIDine (CATAPRES) 0.3 MG tablet take 1 tablet by mouth twice a day 05/07/15   Binnie Rail, MD  clopidogrel (PLAVIX) 75 MG tablet Take 1 tablet (75 mg total) by mouth daily with breakfast. 11/27/14   Charlynne Cousins, MD  DULERA 100-5 MCG/ACT AERO inhale 2 puffs by mouth once daily (THEN RINSE MOUTH AFTER USE) 07/14/14   Rowe Clack, MD  ipratropium-albuterol (DUONEB) 0.5-2.5 (3) MG/3ML SOLN USE ONE VIAL VIA NEBULIZER EVERY 6 HOURS AS NEEDED FOR SHORTNESS OF BREATH 12/17/14   Deneise Lever, MD  isosorbide mononitrate (IMDUR) 60 MG 24 hr tablet Take 2 tablets (120 mg total) by mouth daily. 05/13/15   Binnie Rail, MD  latanoprost (XALATAN) 0.005 % ophthalmic solution Place 1 drop into both eyes at bedtime.     Historical Provider, MD  lidocaine (XYLOCAINE) 2 % solution Use as  directed 20 mLs in the mouth or throat as needed for mouth pain. 05/11/15   Ezekiel Slocumb, PA-C  LORazepam (ATIVAN) 0.5 MG tablet Take 1 tablet (0.5 mg total) by mouth 3 (three) times daily. 09/10/14   Rowe Clack, MD  metoprolol tartrate (LOPRESSOR) 100 MG tablet Take 1 tablet (100 mg total) by mouth 2 (two) times daily. 02/25/15   Bhavinkumar Bhagat, PA  montelukast (SINGULAIR) 10 MG tablet Take 1 tablet (10 mg total) by mouth at bedtime. 11/11/14   Rowe Clack, MD  moxifloxacin (AVELOX) 400 MG tablet Take 1 tablet (400 mg total) by mouth daily. 04/26/15   Binnie Rail, MD  nitroGLYCERIN (NITROSTAT) 0.4 MG SL tablet Place 1 tablet (0.4 mg total) under the tongue every 5 (five) minutes as needed for chest pain. 07/13/14   Minus Breeding, MD  NONFORMULARY OR COMPOUNDED ITEM Allergy Vaccine 1:10 Given at [place] Provider, MD  pantoprazole (PROTONIX) 40 MG tablet take 1 tablet by mouth twice a day Patient taking differently: take 40 mg by mouth twice a day 01/01/15   Minus Breeding, MD  pravastatin (PRAVACHOL) 80 MG tablet take 1 tablet by mouth every morning 04/20/15   Minus Breeding, MD  Probiotic Product ([place]) Take 1 capsule by mouth every evening.     Historical Provider, MD  RA VITAMIN B-12 TR 1000 MCG TBCR take 1 tablet by mouth once daily Patient taking differently: take 1000 mcg by mouth once daily 01/23/14   Rowe Clack, MD  valsartan (DIOVAN) 320 MG tablet Take 1 tablet (320 mg total) by mouth daily. 05/13/15   Binnie Rail, MD    Physical Exam: Filed Vitals:   05/17/15 1540 05/17/15 1815 05/17/15 1830 05/17/15 1845  BP: 212/73 172/68 164/68 145/62  Pulse: 99 90 85 85  Temp:      TempSrc:      Resp: 16 13 16 16   SpO2: 98% 99% 98% 98%   General: Not in acute distress HEENT:       Eyes: PERRL, EOMI, no scleral icterus.       ENT: No discharge from the ears and nose, no pharynx injection, no tonsillar enlargement.        Neck: No JVD, no  bruit, no mass felt. Heme: No neck lymph node  enlargement. Cardiac: [id number], RRR, 3/6 systolic murmurs, No gallops or rubs. Pulm: No rales, wheezing, rhonchi or rubs. Abd: Soft, nondistended, nontender, no rebound pain, no organomegaly, BS present. Ext: No pitting leg edema bilaterally. 2+DP/PT pulse bilaterally. Musculoskeletal: No joint deformities, No joint redness or warmth, no limitation of ROM in spin. Skin: No rashes.  Neuro: Alert, oriented X3, cranial nerves II-XII grossly intact, muscle strength 5/5 in all extremities, sensation to light touch intact. Knee reflex 1+ bilaterally. Negative Babinski's sign. Normal finger to nose test. Psych: Patient is not psychotic, no suicidal or hemocidal ideation.  Labs on Admission:  Basic Metabolic Panel:  Recent Labs Lab 05/17/15 1258  NA 132*  K 3.8  CL 98*  CO2 25  GLUCOSE 111*  BUN 18  CREATININE 0.93  CALCIUM 8.8*   Liver Function Tests: No results for input(s): AST, ALT, ALKPHOS, BILITOT, PROT, ALBUMIN in the last 168 hours. No results for input(s): LIPASE, AMYLASE in the last 168 hours. No results for input(s): AMMONIA in the last 168 hours. CBC:  Recent Labs Lab 05/17/15 1258  WBC 6.5  HGB 12.6*  HCT 36.8*  MCV 91.8  PLT 163   Cardiac Enzymes: No results for input(s): CKTOTAL, CKMB, CKMBINDEX, TROPONINI in the last 168 hours.  BNP (last 3 results)  Recent Labs  09/17/14 2200 11/24/14 2125  BNP 249.5* 648.9*    ProBNP (last 3 results) No results for input(s): PROBNP in the last 8760 hours.  CBG: No results for input(s): GLUCAP in the last 168 hours.  Radiological Exams on Admission: Dg Chest 2 View  05/17/2015  CLINICAL DATA:  Chest pain 2 nights ago. EXAM: CHEST  2 VIEW COMPARISON:  05/07/2015 FINDINGS: Prior CABG. Heart is upper limits normal in size. No confluent airspace opacities or effusions. Calcifications in the aorta. No acute bony abnormality. IMPRESSION: No active cardiopulmonary disease.  Electronically Signed   By: Rolm Baptise M.D.   On: 05/17/2015 13:34    Assessment/Plan Principal Problem:   Chest pain Active Problems:   Hyperlipidemia with target LDL less than 100   Coronary atherosclerosis   Asthma with COPD ([place])   ESOPHAGEAL STRICTURE   GERD   Obstructive sleep apnea   Bruit of left carotid artery   Osteoporosis/severe osteopenia   Chronic diastolic heart failure (H[facility])   PAF (paroxysmal atrial fibrillation) ([facility])   Anemia, iron deficiency   Generalized anxiety disorder   Hx of CABG 2008   CAD S/P S-PDA DES 2012   Murmur   NSTEMI (non-ST elevated myocardial infarction) ([place])   Subclavian artery stenosis, left   PAD (peripheral artery disease) ([facility])   Acute encephalopathy   Numbness and tingling in right hand   Hyponatremia   Chest pain and Hx of CAD: s/p CABG 2008 and stent. CXR has PNA. Patient is on Eliquis, less likely to have pulmonary embolism. Given his significant cardiac history and risk factors, cardiology was consulted by EDP. Dr. Marlou Porch evaluated pt. He recommended to continue medical management unless symptoms worsen in which case repeat diagnostic angiography could take place. Currently chest pain-free.  - will admit to Tele bed  - Appreciate cardiology's consultation and follow-up recommendations - cycle CE q6 x3 and repeat her EKG in the am  - prn Nitroglycerin and tylenol (pt is allergic to Morphine) - Per Dr. Marlou Porch, will hold anticoagulation, Eliquis in case intervention is warranted - pt received 1 dose of aspirin 324 mg in emergency room, will continue 81 mg daily while his Eliquis  is on hold (pt was not on ASA at home) - Patient is also on Plavix -Continue isosorbide and metoprolol - Risk factor stratification: will check FLP and A1C  - 2d echo  Right hand numbness: pt may have trigger thumb, but he also reports right hand numbness. Given his significant risk factors, will need to rule out stroke/TIA.  -MRI-brain -prn tylenol  for pain  Addendum: MRI showed Punctate probable acute ischemia RIGHT frontal lobe deep white matter. - Consulted to Neurology. Dr. Silverio Decamp will see pt. - MRA of the brain without contrast  - PT consult, OT consult - SLP - 2 d Echocardiogram  - Carotid dopplers  I spoke with Dr. Silverio Decamp. He agreed that pt is on Plavix and ASA now while Eliquis is on hold. Per Dr. Silverio Decamp, after competed chest pain workup, pt should be only on Eliquis (no plabvix and ASA when Eliquis is restarted). Per Dr. Silverio Decamp, pt is OK to start IV heparin if his trop is elevated significantly).  Acute encephalopathy: Patient has intermittent confusion recently per his wife. Currently mental status is normal. Etiology is not clear. Differential diagnosis include UTI, delirium, TIA or stroke. Seems not related to hyponatremia since patient's mental status is normal currently. -check urinalysis -Follow-up MRI -PT/OT  Atrial Fibrillation: CHA2DS2-VASc Score is 4, needs oral anticoagulation. Patient is on Eliquis at home. INR is pending on admission. Heart rate is controlled. - Per Dr. Marlou Porch, will hold anticoagulation, Eliquis in case intervention is warranted -Continue metoprolol  Essential hypertension: his blood pressure was initially elevated at 212/73, which improved spontaneously in the emergency room. Currently blood pressure is 163/72. Patient reports that his blood pressure has not been controlled well recently. His medication has been adjusted by his doctor. -continue home metoroplol, clonidine, Imdur - switch Diovan to Irbesartan in hospital - Add amlodipine 5 mg daily per Dr. Marlou Porch - IV hydralazine.  Chronic diastolic congestive heart failure: 2d echo on 09/29/14 showed EF 55-60 percent with grade 2 diastolic dysfunction. Patient reports that he is taking Torsemidm 20 mg daily. No leg edema or JVD. CHF is compensated -Hold Torsemide while pt is on NPO -continue metoprolol -check BNP -Newly started  aspirin while his Eliquis is on hold  Hyponatremia: Appears chronic. Last 3 sodium's were 129 since 04/13/15. May be due to diuretics0 .-torsemide is on hold on admission -f/u by BMP  OSA: -CPAP  GERD: -Protonix  HLD: Last LDL was 81 10/21/14 -Continue home medications: Pravastatin -f /u FLP  Hx of COPD and asthma: stable. No signs of acute exacerbation. -Continue Pulmicort, DuoNeb,Durlera, Singulair -prn Albuterol nebs   DVT ppx: SCD  Code Status: Full code Family Communication: Yes, patient's wife at bed side Disposition Plan: Admit to inpatient   Date of Service 05/17/2015    Ivor Costa [facility] Pager [phone]  If 7PM-7AM, please contact night-coverage [email or web address] Password [email or web address] 05/17/2015, 8:43 PM

## 2015-05-17 NOTE — Progress Notes (Signed)
Patient refused CPAP stating that he does not like the hospital unit.  He admitted that the CPAP unit brought by RT looks like his at home, but still refused.  Patient verbalizes understanding that he can call for CPAP at later time if wanted.

## 2015-05-17 NOTE — Consult Note (Signed)
Admit date: 05/17/2015 Referring Physician  Dr. Rex Kras (ER) Primary Physician Binnie Rail, MD Primary Cardiologist  Dr. Percival Spanish Reason for Consultation  Chest pain  HPI: 82 year old male with coronary artery disease status post bypass surgery as well as Promus DES to S-PDA and 06/2010 here with chest discomfort. He had multiple other complaints according to triage nurse with episode of chest pain 2 nights ago relieved with nitroglycerin. He is also having hypertension and recent med changes as well as contractures to his right hand. He also recently had complaints of his gums hurting, pain started after taking an antibiotic for pneumonia and he stated he was on for 2 weeks and eventually switched to quinolone for another 2 weeks. He stopped this at the end of January. Many medications have been tried for his blood pressure but "nothing has helped ".  On 05/13/15 telephone note stated to discontinue losartan 100 mg a day and start valsartan 320 mg daily. He was also instructed to increase his isosorbide to 120 mg daily.  His wife thought that he perhaps was supposed to take 60 mg of isosorbide twice in the morning and twice in the evening but I clarified this.  He has been back and forth to the ER before twice in one day at one point and would just like to make sure that everything is stable.  In 2016 he was hospitalized with hypertensive urgency and non-ST elevation myocardial infarction. He had subtotal stenosis of the vein graft to the posterior lateral off of the right coronary and stenting to the native right coronary artery to place. He was also found to have subclavian stenosis. He was admitted for PCI of the subclavian. During that hospitalization medication changes included addition of spironolactone as well as increased metoprolol. Following the procedure, he developed an ileus. He had been on Eliquis for atrial fibrillation. C. difficile stool studies were sent because of continued  diarrhea. This was negative back in December 2016.  He has been taking both Plavix as well as Eliquis given his recent interventions.   Currently on telemetry as well, he appears to be in sinus rhythm. He was worried because at home occasionally his heart rate will increase into the 110s.     PMH:   Past Medical History  Diagnosis Date  . CORONARY HEART DISEASE     a. s/p CABG;  b. cath 06/27/10: S-Dx occluded, S-PDA 80-90% (tx with PCI); S-OM ok, L-LAD ok;  EF 65-70%  c.  s/p Promus DES to S-PDA 06/2010;   d. Cath 8 2016 LIMA to the LAD patent, SVG to posterior lateral subtotal, SVG to diagonal occluded chronically. The patient had stenting of the native right vessel.  Marland Kitchen FIBRILLATION, ATRIAL   . SLEEP APNEA 09/2001    NPSG AHI 22/HR  . ASTHMA   . Depression   . GERD     with HH, hx esophageal stricture  . DYSLIPIDEMIA   . HYPERTENSION     Echo 3/12: EF 55-60%; mod LVH; mild AS/AI; LAE; PASP 38; mild pulmo HTN  . Personal history of alcoholism ([place])   . Diverticulosis   . Benign liver cyst   . Skin cancer     L forearm  . Cataract     surgery to both eyes  . Esophageal stricture   . Hiatal hernia   . Pneumonia 04/2013 hosp  . Anxiety   . Arthritis   . CHF (congestive heart failure) ([place])   . COPD (chronic  obstructive pulmonary disease) ([place])   . Glaucoma   . Osteoporosis   . Subclavian arterial stenosis (HCC)     PSH:   Past Surgical History  Procedure Laterality Date  . Coronary artery bypass graft  02/2007  . Tonsillectomy    . Coronary angioplasty with stent placement  07/2010  . Cataract extraction, bilateral  2007  . Hernia repair  unsure ?60's  . Cholecystectomy  02/21/2011    Procedure: LAPAROSCOPIC CHOLECYSTECTOMY WITH INTRAOPERATIVE CHOLANGIOGRAM;  Surgeon: Pedro Earls, MD;  Location: [facility];  Service: General;  Laterality: N/A;  . Cardiac catheterization N/A 11/26/2014    Procedure: Left Heart Cath and Cors/Grafts Angiography;  Surgeon: Sherren Mocha, MD;   Location: [facility];  Service: Cardiovascular;  Laterality: N/A;  . Cardiac catheterization N/A 11/26/2014    Procedure: Coronary Stent Intervention;  Surgeon: Sherren Mocha, MD;  Location: [facility];  Service: Cardiovascular;  Laterality: N/A;  . Peripheral vascular catheterization N/A 02/22/2015    Procedure: Upper Extremity Angiography;  Surgeon: Lorretta Harp, MD;  Location: [facility];  Service: Cardiovascular;  Laterality: N/A;   Allergies:  Ace inhibitors; Avelox; Morphine and related; Codeine; Lasix; and Other Prior to Admit Meds:   Prior to Admission medications   Medication Sig Start Date End Date Taking? Authorizing Provider  acetaminophen (TYLENOL) 325 MG tablet Take 325 mg by mouth every 6 (six) hours as needed for moderate pain or headache.    Historical Provider, MD  albuterol (PROVENTIL HFA;VENTOLIN HFA) 108 (90 BASE) MCG/ACT inhaler Inhale 2 puffs into the lungs every 6 (six) hours as needed for wheezing or shortness of breath. 06/24/14   Deneise Lever, MD  apixaban (ELIQUIS) 5 MG TABS tablet Take 1 tablet (5 mg total) by mouth 2 (two) times daily. 10/19/14   Rowe Clack, MD  budesonide (PULMICORT) 0.25 MG/2ML nebulizer solution Take 0.25 mg by nebulization at bedtime. For asthma    Historical Provider, MD  calcium-vitamin D (OSCAL WITH D) 500-200 MG-UNIT per tablet Take 1 tablet by mouth 2 (two) times daily.    Historical Provider, MD  cholestyramine (QUESTRAN) 4 G packet [place] 1 PACKET BY MOUTH TWICE A DAY IF NEEDED FOR LOOSE STOOLS 04/02/15   Irene Shipper, MD  cloNIDine (CATAPRES) 0.3 MG tablet take 1 tablet by mouth twice a day 05/07/15   Binnie Rail, MD  clopidogrel (PLAVIX) 75 MG tablet Take 1 tablet (75 mg total) by mouth daily with breakfast. 11/27/14   Charlynne Cousins, MD  DULERA 100-5 MCG/ACT AERO inhale 2 puffs by mouth once daily (THEN RINSE MOUTH AFTER USE) 07/14/14   Rowe Clack, MD  ipratropium-albuterol (DUONEB)  0.5-2.5 (3) MG/3ML SOLN USE ONE VIAL VIA NEBULIZER EVERY 6 HOURS AS NEEDED FOR SHORTNESS OF BREATH 12/17/14   Deneise Lever, MD  isosorbide mononitrate (IMDUR) 60 MG 24 hr tablet Take 2 tablets (120 mg total) by mouth daily. 05/13/15   Binnie Rail, MD  latanoprost (XALATAN) 0.005 % ophthalmic solution Place 1 drop into both eyes at bedtime.     Historical Provider, MD  lidocaine (XYLOCAINE) 2 % solution Use as directed 20 mLs in the mouth or throat as needed for mouth pain. 05/11/15   Ezekiel Slocumb, PA-C  LORazepam (ATIVAN) 0.5 MG tablet Take 1 tablet (0.5 mg total) by mouth 3 (three) times daily. 09/10/14   Rowe Clack, MD  metoprolol tartrate (LOPRESSOR) 100 MG tablet Take  1 tablet (100 mg total) by mouth 2 (two) times daily. 02/25/15   Bhavinkumar Bhagat, PA  montelukast (SINGULAIR) 10 MG tablet Take 1 tablet (10 mg total) by mouth at bedtime. 11/11/14   Rowe Clack, MD  moxifloxacin (AVELOX) 400 MG tablet Take 1 tablet (400 mg total) by mouth daily. 04/26/15   Binnie Rail, MD  nitroGLYCERIN (NITROSTAT) 0.4 MG SL tablet Place 1 tablet (0.4 mg total) under the tongue every 5 (five) minutes as needed for chest pain. 07/13/14   Minus Breeding, MD  NONFORMULARY OR COMPOUNDED ITEM Allergy Vaccine 1:10 Given at [place] Provider, MD  pantoprazole (PROTONIX) 40 MG tablet take 1 tablet by mouth twice a day Patient taking differently: take 40 mg by mouth twice a day 01/01/15   Minus Breeding, MD  pravastatin (PRAVACHOL) 80 MG tablet take 1 tablet by mouth every morning 04/20/15   Minus Breeding, MD  Probiotic Product ([place]) Take 1 capsule by mouth every evening.     Historical Provider, MD  RA VITAMIN B-12 TR 1000 MCG TBCR take 1 tablet by mouth once daily Patient taking differently: take 1000 mcg by mouth once daily 01/23/14   Rowe Clack, MD  valsartan (DIOVAN) 320 MG tablet Take 1 tablet (320 mg total) by mouth daily. 05/13/15   Binnie Rail, MD   Fam HX:      Family History  Problem Relation Age of Onset  . COPD Sister   . Asthma Sister   . Emphysema Sister   . Hypertension Sister   . Hyperlipidemia Sister   . Hypertension Mother   . Heart disease Mother   . Hyperlipidemia Mother   . Colon cancer Neg Hx   . Heart disease Brother   . Heart disease Father   . Hyperlipidemia Father   . Hypertension Father    Social HX:    Social History   Social History  . Marital Status: Married    Spouse Name: N/A  . Number of Children: 2  . Years of Education: N/A   Occupational History  . retired     110 years   Social History Main Topics  . Smoking status: Former Smoker -- 3.00 packs/day for 40 years    Types: Cigarettes    Quit date: 04/11/1979  . Smokeless tobacco: Never Used  . Alcohol Use: No     Comment: quit drinking 40+ years  . Drug Use: No  . Sexual Activity: Yes   Other Topics Concern  . 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 Married lives with wife 2 children     ROS:  Denies any syncope, bleeding, orthopnea, PND All 11 ROS were addressed and are negative except what is stated in the HPI   Physical Exam: Blood pressure 212/73, pulse 99, temperature 98.3 F (36.8 C), temperature source Oral, resp. rate 16, SpO2 98 %.   General: Well developed, short in stature, well nourished, in no acute distress Head: Eyes PERRLA, No xanthomas.   Normal cephalic and atramatic  Lungs:   Clear bilaterally to auscultation and percussion. Normal respiratory effort. No wheezes, no rales. Heart:   HRRR S1 S2 Pulses are 2+ & equal. 2/6 systolic ejection murmur right upper sternal border, rubs, gallops.  No carotid bruit. No JVD.  No abdominal bruits.  Abdomen: Bowel sounds are positive, abdomen soft and non-tender without masses. No hepatosplenomegaly. Msk:  Back normal. Normal  strength and tone for age. Extremities:  No clubbing, cyanosis or edema.  DP +1 Neuro: Alert and oriented X 3, non-focal, MAE  x 4 GU: Deferred Rectal: Deferred Psych:  Good affect, responds appropriately      Labs: Lab Results  Component Value Date   WBC 6.5 05/17/2015   HGB 12.6* 05/17/2015   HCT 36.8* 05/17/2015   MCV 91.8 05/17/2015   PLT 163 05/17/2015     Recent Labs Lab 05/17/15 1258  NA 132*  K 3.8  CL 98*  CO2 25  BUN 18  CREATININE 0.93  CALCIUM 8.8*  GLUCOSE 111*   No results for input(s): CKTOTAL, CKMB, TROPONINI in the last 72 hours. Lab Results  Component Value Date   CHOL 139 10/21/2014   HDL 45.40 10/21/2014   LDLCALC 81 10/21/2014   TRIG 62.0 10/21/2014   Lab Results  Component Value Date   DDIMER 0.54* 11/24/2014     Radiology:  Dg Chest 2 View  05/17/2015  CLINICAL DATA:  Chest pain 2 nights ago. EXAM: CHEST  2 VIEW COMPARISON:  05/07/2015 FINDINGS: Prior CABG. Heart is upper limits normal in size. No confluent airspace opacities or effusions. Calcifications in the aorta. No acute bony abnormality. IMPRESSION: No active cardiopulmonary disease. Electronically Signed   By: Rolm Baptise M.D.   On: 05/17/2015 13:34   Personally viewed.  EKG:  05/17/15 at 1247-atrial fibrillation mildly tachycardic with heart rate 107 with nonspecific ST-T wave changes/artifactual baseline. No obvious dramatic ST segment changes. Personally viewed. Prior EKG on 03/02/15 demonstrated sinus rhythm with first-degree AV block.  Cardiac catheterization 11/26/14  Severe multivessel CAD with 70% left main, 70% LAD, and 80% RCA stenosis  Preserved (normal) LV systolic function with LVEF 70-75%  S/P CABG with continued patency of the LIMA-LAD, SVG-OM  Subtotal occlusion of the SVG-PLA and chronic occlusion of the SVG-diagonal  Successful PCI of the native RCA with a Synergy DES  Echocardiogram 09/29/14 - Normal LV size with mild LV hypertrophy. EF 55-60% with moderatediastolic dysfunction. Normal RV size and systolic function. Mildpulmonary hypertension. There was mild to moderate  aorticstenosis (mild by mean gradient, moderate by calculated valvearea.  ASSESSMENT/PLAN:    82 year old male with known coronary artery disease status post bypass surgery with recent native right coronary artery intervention as well as subclavian intervention here with chest discomfort, possible unstable angina, currently negative troponin. Thankfully, currently chest pain-free. More concerned now with his hypertension which has been a long-standing issue challenging to control.  Angina  - Continue to cycle troponin.  - If troponin remains normal and EKG continues to remain unremarkable, I would advocate continued medical management unless symptoms worsen in which case repeat diagnostic angiography could take place. Holding anticoagulation, Eliquis in case intervention is warranted is a reasonable plan.  - Continue isosorbide 120, high-dose metoprolol  - Consider addition of amlodipine 5 mg  - Currently chest pain-free.  Atrial fibrillation  - Paroxysmal. CHADS-VASc (at least 4), currently appears to be sinus rhythm on telemetry.  - Hold Eliquis in case intervention is  Warranted  - Would only start heparin if troponin becomes elevated.  Aortic stenosis  - Moderate on echo.  Essential hypertension  - elevated today. He has previously had hypertensive urgency.  - He is on several antihypertensives including clonidine.  - Would consider addition of amlodipine 5 mg not only his antianginal but also as antihypertensive.  Coronary artery disease  - As above  Hyponatremia  - Appears chronic. Last 3  sodium's were 129.  We will follow along.  Candee Furbish, MD  05/17/2015  6:39 PM

## 2015-05-17 NOTE — ED Notes (Signed)
Pt has multiple complaints. Reports episode of chest pain two nights ago, was relieved with nitro. Also having HTN, recent med changes and contractures to right hand. ekg done at triage, no acute resp distress noted.

## 2015-05-18 ENCOUNTER — Other Ambulatory Visit (HOSPITAL_COMMUNITY): Payer: Medicare Other

## 2015-05-18 ENCOUNTER — Encounter (HOSPITAL_COMMUNITY): Payer: Medicare Other

## 2015-05-18 DIAGNOSIS — I2510 Atherosclerotic heart disease of native coronary artery without angina pectoris: Secondary | ICD-10-CM | POA: Insufficient documentation

## 2015-05-18 DIAGNOSIS — I63131 Cerebral infarction due to embolism of right carotid artery: Secondary | ICD-10-CM

## 2015-05-18 DIAGNOSIS — I6340 Cerebral infarction due to embolism of unspecified cerebral artery: Secondary | ICD-10-CM | POA: Insufficient documentation

## 2015-05-18 DIAGNOSIS — I639 Cerebral infarction, unspecified: Secondary | ICD-10-CM

## 2015-05-18 DIAGNOSIS — I25118 Atherosclerotic heart disease of native coronary artery with other forms of angina pectoris: Secondary | ICD-10-CM

## 2015-05-18 LAB — LIPID PANEL
CHOL/HDL RATIO: 2.6 ratio
Cholesterol: 122 mg/dL (ref 0–200)
HDL: 47 mg/dL (ref >40)
LDL CALC: 67 mg/dL (ref 0–99)
TRIGLYCERIDES: 41 mg/dL (ref <150)
VLDL: 8 mg/dL (ref 0–40)

## 2015-05-18 LAB — TROPONIN I
TROPONIN I: 0.03 ng/mL (ref <0.031)
Troponin I: <0.03 ng/mL (ref <0.031)

## 2015-05-18 LAB — GLUCOSE, CAPILLARY: Glucose-Capillary: 87 mg/dL (ref 65–99)

## 2015-05-18 LAB — BRAIN NATRIURETIC PEPTIDE: B NATRIURETIC PEPTIDE 5: 357.4 pg/mL — AB (ref 0.0–100.0)

## 2015-05-18 MED ORDER — APIXABAN 5 MG PO TABS
5.0000 mg | ORAL_TABLET | Freq: Two times a day (BID) | ORAL | Status: DC
  Administered 2015-05-18 – 2015-05-19 (×3): 5 mg via ORAL
  Filled 2015-05-18 (×3): qty 1

## 2015-05-18 NOTE — Progress Notes (Addendum)
[facility] PROGRESS NOTE  [name]. [id number] DOB: 1930/08/07 DOA: 05/17/2015 PCP: Binnie Rail, MD  Summary 82 -year-old white male with history of CAD, HTN, PAF on eliquis presented with anginal pain as well as intermittent confusion and right hand numbness. Cardiology consulted and recommended continued medical management unless symptoms or troponins were sent. Incidentally found was tiny punctate infarct on MRI.  Neurology consulted and infarct felt to be clinically significant or the etiology for his presenting symptoms.   Assessment/Plan:  Principal Problem:   Resting angina: MI ruled out. Will resume eliquis Active Problems:  Incidental right frontal white matter infarct, embolic secondary to known atrial fibrillation. Neurology has canceled stroke workup and signed off as not associated with presenting symptoms of right hand numbness. Essential hypertension: Cardiology has started amlodipine.   Hyperlipidemia: LDL 67   Chronic diastolic heart failure (HCC), compensated    PAF (paroxysmal atrial fibrillation) (HCC) CHADSVASC 4.  on anticoagulation    Numbness and tingling in right hand: Resolved. Unlikely related to MRI findings.   Code Status:  full Family Communication:  Patient is lucid Disposition Plan:  Home tomorrow if stable  Consultants:  [facility]  Procedures:     Antibiotics:    HPI/Subjective: No CP. No confusion, numbness, tingling  Objective: Filed Vitals:   05/18/15 0905 05/18/15 1105  BP: 169/65 106/66  Pulse: 83 80  Temp:  97.6 F (36.4 C)  Resp:  18    Intake/Output Summary (Last 24 hours) at 05/18/15 1440 Last data filed at 05/18/15 1230  Gross per 24 hour  Intake 1196.63 ml  Output   1025 ml  Net 171.63 ml   Filed Weights   05/17/15 2136 05/18/15 0330  Weight: 63.912 kg (140 lb 14.4 oz) 63.549 kg (140 lb 1.6 oz)    Exam:   General:  A and o.  Cardiovascular: RRR with  murmur  Respiratory: CTA without WRR  Abdomen: S, NT, ND  Ext: no CCE  Neuro: nonfocal  Basic Metabolic Panel:  Recent Labs Lab 05/17/15 1258  NA 132*  K 3.8  CL 98*  CO2 25  GLUCOSE 111*  BUN 18  CREATININE 0.93  CALCIUM 8.8*   Liver Function Tests: No results for input(s): AST, ALT, ALKPHOS, BILITOT, PROT, ALBUMIN in the last 168 hours. No results for input(s): LIPASE, AMYLASE in the last 168 hours. No results for input(s): AMMONIA in the last 168 hours. CBC:  Recent Labs Lab 05/17/15 1258  WBC 6.5  HGB 12.6*  HCT 36.8*  MCV 91.8  PLT 163   Cardiac Enzymes:  Recent Labs Lab 05/17/15 2014 05/18/15 0302 05/18/15 0728  TROPONINI 0.03 0.03 <0.03   BNP (last 3 results)  Recent Labs  09/17/14 2200 11/24/14 2125 05/18/15 0302  BNP 249.5* 648.9* 357.4*    ProBNP (last 3 results) No results for input(s): PROBNP in the last 8760 hours.  CBG:  Recent Labs Lab 05/18/15 0934  GLUCAP 87    No results found for this or any previous visit (from the past 240 hour(s)).   Studies: Dg Chest 2 View  05/17/2015  CLINICAL DATA:  Chest pain 2 nights ago. EXAM: CHEST  2 VIEW COMPARISON:  05/07/2015 FINDINGS: Prior CABG. Heart is upper limits normal in size. No confluent airspace opacities or effusions. Calcifications in the aorta. No acute bony abnormality. IMPRESSION: No active cardiopulmonary disease. Electronically Signed   By: Rolm Baptise M.D.   On: 05/17/2015 13:34   Mr  Brain Wo Contrast  05/17/2015  CLINICAL DATA:  Intermittent chest pain beginning yesterday. Cough. Intermittent RIGHT hand numbness for several months with thumb contracture. Intermittent confusion. History of atrial fibrillation, hypertension, dyslipidemia. EXAM: MRI HEAD WITHOUT CONTRAST TECHNIQUE: Multiplanar, multiecho pulse sequences of the brain and surrounding structures were obtained without intravenous contrast. COMPARISON:  CT head October 07, 2011 and MRI of the brain December 15, 2007  FINDINGS: The ventricles and sulci are normal for patient's age. No abnormal parenchymal signal, mass lesions, mass effect. Patchy supratentorial white matter FLAIR T2 hyperintensities. Punctate focus of reduced diffusion RIGHT frontal lobe deep white matter, due to small size, difficult to evaluate on ADC map. Tiny foci of susceptibility artifact biparietal lobes are nonspecific, can be seen with chronic hypertension. No susceptibility artifact to suggest lobar hematoma. No abnormal extra-axial fluid collections. No extra-axial masses though, contrast enhanced sequences would be more sensitive. Normal major intracranial vascular flow voids seen at the skull base. Status post bilateral ocular lens implants. No abnormal sellar expansion. No suspicious calvarial bone marrow signal. Craniocervical junction maintained. Visualized paranasal sinuses and mastoid air cells are well-aerated. Patient appears edentulous. IMPRESSION: Punctate probable acute ischemia RIGHT frontal lobe deep white matter. Involutional changes. Mild to moderate chronic small vessel ischemic disease. Electronically Signed   By: Elon Alas M.D.   On: 05/17/2015 21:06    Scheduled Meds: . acidophilus  1 capsule Oral QPM  . amLODipine  5 mg Oral Daily  . apixaban  5 mg Oral BID  . budesonide  0.25 mg Nebulization QHS  . calcium-vitamin D  1 tablet Oral BID  . cloNIDine  0.3 mg Oral BID  . clopidogrel  75 mg Oral Q breakfast  . irbesartan  300 mg Oral Daily  . isosorbide mononitrate  120 mg Oral Daily  . latanoprost  1 drop Both Eyes QHS  . LORazepam  0.5 mg Oral TID  . metoprolol tartrate  100 mg Oral BID  . mometasone-formoterol  2 puff Inhalation BID  . montelukast  10 mg Oral QHS  . pantoprazole  40 mg Oral BID  . pravastatin  80 mg Oral q morning - 10a  . vitamin B-12  1,000 mcg Oral Daily   Continuous Infusions:   Time spent: 35 minutes  [facility] [email or web address], password  [facility] 05/18/2015, 2:40 PM  LOS: 1 day

## 2015-05-18 NOTE — Consult Note (Addendum)
Requesting Physician: Dr.  Blaine Hamper    Reason for consultation: Acute punctate stroke on MRI  HPI:                                                                                                                                         Stephen Doto. is an 82 y.o. male patient with Known A. fib, on eliquis and Plavix at home, poorly controlled hypertension, admitted for further cardiac evaluation due to chest pain. He also reported some confusion and right hand numbness at admission. Hence brain MRI study was done as part of workup by the hospitalist team. It showed a tiny punctate acute infarcts in the right frontal white matter. Based on the size of this punctate infarct, likely to be asymptomatic incidental finding secondary to atrial fibrillation causing cerebral microemboli. Eliquis was held by cardiology planning for cardiac catheterization procedure. His been placed now on dual antiplatelet aspirin and Plavix the hospitalist.   Patient denies any neurological symptoms. No vision speech problems or focal sensorimotor symptoms  Date last known well:  Unknown Time last known well:  Unknown tPA Given: No: Unknown time of onset,   Stroke Risk Factors - atrial fibrillation, poorly controlled hypertension  Past Medical History: Past Medical History  Diagnosis Date  . CORONARY HEART DISEASE     a. s/p CABG;  b. cath 06/27/10: S-Dx occluded, S-PDA 80-90% (tx with PCI); S-OM ok, L-LAD ok;  EF 65-70%  c.  s/p Promus DES to S-PDA 06/2010;   d. Cath 8 2016 LIMA to the LAD patent, SVG to posterior lateral subtotal, SVG to diagonal occluded chronically. The patient had stenting of the native right vessel.  Marland Kitchen FIBRILLATION, ATRIAL   . SLEEP APNEA 09/2001    NPSG AHI 22/HR  . ASTHMA   . Depression   . GERD     with HH, hx esophageal stricture  . DYSLIPIDEMIA   . HYPERTENSION     Echo 3/12: EF 55-60%; mod LVH; mild AS/AI; LAE; PASP 38; mild pulmo HTN  . Personal history of alcoholism ([place])   .  Diverticulosis   . Benign liver cyst   . Skin cancer     L forearm  . Cataract     surgery to both eyes  . Esophageal stricture   . Hiatal hernia   . Pneumonia 04/2013 hosp  . Anxiety   . Arthritis   . CHF (congestive heart failure) ([place])   . COPD (chronic obstructive pulmonary disease) ([place])   . Glaucoma   . Osteoporosis   . Subclavian arterial stenosis [facility])     Past Surgical History  Procedure Laterality Date  . Coronary artery bypass graft  02/2007  . Tonsillectomy    . Coronary angioplasty with stent placement  07/2010  . Cataract extraction, bilateral  2007  . Hernia repair  unsure ?60's  . Cholecystectomy  02/21/2011  Procedure: LAPAROSCOPIC CHOLECYSTECTOMY WITH INTRAOPERATIVE CHOLANGIOGRAM;  Surgeon: Pedro Earls, MD;  Location: [facility];  Service: General;  Laterality: N/A;  . Cardiac catheterization N/A 11/26/2014    Procedure: Left Heart Cath and Cors/Grafts Angiography;  Surgeon: Sherren Mocha, MD;  Location: [place] [facility];  Service: Cardiovascular;  Laterality: N/A;  . Cardiac catheterization N/A 11/26/2014    Procedure: Coronary Stent Intervention;  Surgeon: Sherren Mocha, MD;  Location: [place] [facility];  Service: Cardiovascular;  Laterality: N/A;  . Peripheral vascular catheterization N/A 02/22/2015    Procedure: Upper Extremity Angiography;  Surgeon: Lorretta Harp, MD;  Location: [place] [facility];  Service: Cardiovascular;  Laterality: N/A;    Family History: Family History  Problem Relation Age of Onset  . COPD Sister   . Asthma Sister   . Emphysema Sister   . Hypertension Sister   . Hyperlipidemia Sister   . Hypertension Mother   . Heart disease Mother   . Hyperlipidemia Mother   . Colon cancer Neg Hx   . Heart disease Brother   . Heart disease Father   . Hyperlipidemia Father   . Hypertension Father     Social History:   reports that he quit smoking about 36 years ago. His smoking use included Cigarettes. He has a 120 pack-year  smoking history. He has never used smokeless tobacco. He reports that he does not drink alcohol or use illicit drugs.  Allergies:  Allergies  Allergen Reactions  . Ace Inhibitors Other (See Comments)    Severe asthma (COPD)  . Avelox [Moxifloxacin Hcl In Nacl] Other (See Comments)    Gum pain  . Tape Other (See Comments)    Adhesive and tape reaction-tears skin off   . Lidocaine Other (See Comments)    Possible reaction? Dizziness, confusion, syncope  . Morphine And Related Nausea And Vomiting  . Codeine Nausea And Vomiting  . Lasix [Furosemide] Itching and Swelling  . Other Other (See Comments)    Maple trees, allergy symptoms     Medications:                                                                                                                         Current facility-administered medications:  .   stroke: mapping our early stages of recovery book, , Does not apply, Once, Ivor Costa, MD .  0.9 %  sodium chloride infusion, , Intravenous, Continuous, Ivor Costa, MD, Last Rate: 50 mL/hr at 05/17/15 2235 .  acetaminophen (TYLENOL) tablet 650 mg, 650 mg, Oral, Q4H PRN, Ivor Costa, MD .  acidophilus (RISAQUAD) capsule 1 capsule, 1 capsule, Oral, QPM, Ivor Costa, MD, 1 capsule at 05/17/15 2214 .  albuterol (PROVENTIL) (2.5 MG/3ML) 0.083% nebulizer solution 2.5 mg, 2.5 mg, Nebulization, Q4H PRN, Ivor Costa, MD .  amLODipine (NORVASC) tablet 5 mg, 5 mg, Oral, Daily, Ivor Costa, MD, 5 mg at 05/17/15 2214 .  aspirin EC tablet 81 mg, 81 mg, Oral, Daily, Ivor Costa,  MD .  budesonide (PULMICORT) nebulizer solution 0.25 mg, 0.25 mg, Nebulization, QHS, Ivor Costa, MD, 0.25 mg at 05/17/15 2148 .  calcium-vitamin D (OSCAL WITH D) 500-200 MG-UNIT per tablet 1 tablet, 1 tablet, Oral, BID, Ivor Costa, MD, 1 tablet at 05/17/15 2214 .  cholestyramine light (PREVALITE) packet 4 g, 4 g, Oral, BID PRN, Ivor Costa, MD .  cloNIDine (CATAPRES) tablet 0.3 mg, 0.3 mg, Oral, BID, Ivor Costa, MD, 0.3 mg at 05/17/15  2214 .  clopidogrel (PLAVIX) tablet 75 mg, 75 mg, Oral, Q breakfast, Ivor Costa, MD .  hydrALAZINE (APRESOLINE) injection 5 mg, 5 mg, Intravenous, Q2H PRN, Ivor Costa, MD .  ipratropium-albuterol (DUONEB) 0.5-2.5 (3) MG/3ML nebulizer solution 3 mL, 3 mL, Nebulization, Q6H PRN, Ivor Costa, MD .  irbesartan (AVAPRO) tablet 300 mg, 300 mg, Oral, Daily, Ivor Costa, MD .  isosorbide mononitrate (IMDUR) 24 hr tablet 120 mg, 120 mg, Oral, Daily, Ivor Costa, MD .  latanoprost (XALATAN) 0.005 % ophthalmic solution 1 drop, 1 drop, Both Eyes, QHS, Ivor Costa, MD, 1 drop at 05/17/15 2215 .  lidocaine (XYLOCAINE) 2 % viscous mouth solution 20 mL, 20 mL, Mouth/Throat, PRN, Ivor Costa, MD .  LORazepam (ATIVAN) tablet 0.5 mg, 0.5 mg, Oral, TID, Ivor Costa, MD, 0.5 mg at 05/17/15 2215 .  metoprolol tartrate (LOPRESSOR) tablet 100 mg, 100 mg, Oral, BID, Ivor Costa, MD, 100 mg at 05/17/15 2215 .  mometasone-formoterol (DULERA) 100-5 MCG/ACT inhaler 2 puff, 2 puff, Inhalation, BID, Ivor Costa, MD, 2 puff at 05/17/15 2000 .  montelukast (SINGULAIR) tablet 10 mg, 10 mg, Oral, QHS, Ivor Costa, MD, 10 mg at 05/17/15 2214 .  nitroGLYCERIN (NITROSTAT) SL tablet 0.4 mg, 0.4 mg, Sublingual, Q5 min PRN, Ivor Costa, MD .  ondansetron [facility]) injection 4 mg, 4 mg, Intravenous, Q8H PRN, Ivor Costa, MD .  pantoprazole (PROTONIX) EC tablet 40 mg, 40 mg, Oral, BID, Ivor Costa, MD, 40 mg at 05/17/15 2215 .  pravastatin (PRAVACHOL) tablet 80 mg, 80 mg, Oral, q morning - 10a, Ivor Costa, MD .  vitamin B-12 (CYANOCOBALAMIN) tablet 1,000 mcg, 1,000 mcg, Oral, Daily, Ivor Costa, MD, 1,000 mcg at 05/17/15 2220   ROS:                                                                                                                                       History obtained from the patient  General ROS: negative for - chills, fatigue, fever, night sweats, weight gain or weight loss Psychological ROS: negative for - behavioral disorder, hallucinations,  memory difficulties, mood swings or suicidal ideation Ophthalmic ROS: negative for - blurry vision, double vision, eye pain or loss of vision ENT ROS: negative for - epistaxis, nasal discharge, oral lesions, sore throat, tinnitus or vertigo Allergy and Immunology ROS: negative for - hives or itchy/watery eyes Hematological and Lymphatic ROS: negative for - bleeding problems, bruising or swollen lymph nodes  Endocrine ROS: negative for - galactorrhea, hair pattern changes, polydipsia/polyuria or temperature intolerance Respiratory ROS: negative for - cough, hemoptysis, shortness of breath or wheezing Cardiovascular ROS: negative for - chest pain, dyspnea on exertion, edema or irregular heartbeat Gastrointestinal ROS: negative for - abdominal pain, diarrhea, hematemesis, nausea/vomiting or stool incontinence Genito-Urinary ROS: negative for - dysuria, hematuria, incontinence or urinary frequency/urgency Musculoskeletal ROS: negative for - joint swelling or muscular weakness Neurological ROS: as noted in HPI Dermatological ROS: negative for rash and skin lesion changes  Neurologic Examination:                                                                                                      Blood pressure 113/62, pulse 72, temperature 97.5 F (36.4 C), temperature source Oral, resp. rate 18, height 5\' 3"  (1.6 m), weight 63.912 kg (140 lb 14.4 oz), SpO2 97 %.  Evaluation of higher integrative functions including: Level of alertness: Alert,  Oriented to time, place and person Speech: fluent, no evidence of dysarthria or aphasia noted.  Test the following cranial nerves: 2-12 grossly intact Motor examination: Normal tone, bulk, full 5/5 motor strength in all 4 extremities Examination of sensation : symmetric sensation to pinprick in all 4 extremities and on face Examination of deep tendon reflexes: 2+, normal and symmetric in all extremities, normal plantars bilaterally Test coordination: Normal  finger nose testing, with no evidence of limb appendicular ataxia  Gait: Deferred   Lab Results: Basic Metabolic Panel:  Recent Labs Lab 05/17/15 1258  NA 132*  K 3.8  CL 98*  CO2 25  GLUCOSE 111*  BUN 18  CREATININE 0.93  CALCIUM 8.8*    Liver Function Tests: No results for input(s): AST, ALT, ALKPHOS, BILITOT, PROT, ALBUMIN in the last 168 hours. No results for input(s): LIPASE, AMYLASE in the last 168 hours. No results for input(s): AMMONIA in the last 168 hours.  CBC:  Recent Labs Lab 05/17/15 1258  WBC 6.5  HGB 12.6*  HCT 36.8*  MCV 91.8  PLT 163    Cardiac Enzymes:  Recent Labs Lab 05/17/15 2014  TROPONINI 0.03    Lipid Panel: No results for input(s): CHOL, TRIG, HDL, CHOLHDL, VLDL, LDLCALC in the last 168 hours.  CBG: No results for input(s): GLUCAP in the last 168 hours.  Microbiology: Results for orders placed or performed during the hospital encounter of 02/22/15  MRSA PCR Screening     Status: None   Collection Time: 02/23/15  2:42 AM  Result Value Ref Range Status   MRSA by PCR NEGATIVE NEGATIVE Final    Comment:        The GeneXpert MRSA Assay (FDA approved for NASAL specimens only), is one component of a comprehensive MRSA colonization surveillance program. It is not intended to diagnose MRSA infection nor to guide or monitor treatment for MRSA infections.      Imaging: Dg Chest 2 View  05/17/2015  CLINICAL DATA:  Chest pain 2 nights ago. EXAM: CHEST  2 VIEW COMPARISON:  05/07/2015 FINDINGS: Prior CABG. Heart is upper limits normal in size.  No confluent airspace opacities or effusions. Calcifications in the aorta. No acute bony abnormality. IMPRESSION: No active cardiopulmonary disease. Electronically Signed   By: Rolm Baptise M.D.   On: 05/17/2015 13:34   Mr Brain Wo Contrast  05/17/2015  CLINICAL DATA:  Intermittent chest pain beginning yesterday. Cough. Intermittent RIGHT hand numbness for several months with thumb  contracture. Intermittent confusion. History of atrial fibrillation, hypertension, dyslipidemia. EXAM: MRI HEAD WITHOUT CONTRAST TECHNIQUE: Multiplanar, multiecho pulse sequences of the brain and surrounding structures were obtained without intravenous contrast. COMPARISON:  CT head October 07, 2011 and MRI of the brain December 15, 2007 FINDINGS: The ventricles and sulci are normal for patient's age. No abnormal parenchymal signal, mass lesions, mass effect. Patchy supratentorial white matter FLAIR T2 hyperintensities. Punctate focus of reduced diffusion RIGHT frontal lobe deep white matter, due to small size, difficult to evaluate on ADC map. Tiny foci of susceptibility artifact biparietal lobes are nonspecific, can be seen with chronic hypertension. No susceptibility artifact to suggest lobar hematoma. No abnormal extra-axial fluid collections. No extra-axial masses though, contrast enhanced sequences would be more sensitive. Normal major intracranial vascular flow voids seen at the skull base. Status post bilateral ocular lens implants. No abnormal sellar expansion. No suspicious calvarial bone marrow signal. Craniocervical junction maintained. Visualized paranasal sinuses and mastoid air cells are well-aerated. Patient appears edentulous. IMPRESSION: Punctate probable acute ischemia RIGHT frontal lobe deep white matter. Involutional changes. Mild to moderate chronic small vessel ischemic disease. Electronically Signed   By: Elon Alas M.D.   On: 05/17/2015 21:06    Assessment and plan:   Stephen Dacanay. is an 82 y.o. male patient who presented with chest pain, admitted for cardiac evaluation. He has a history of atrial fibrillation, anticoagulated at home with eliquis, and reportedly was also on Plavix 75 mg daily. The tiny punctate acute infarcts seen on the brain MRI in the right frontal white matter is likely an incidental asymptomatic finding, secondary to cerebral microemboli from atrial  fibrillation, which is not surprising. Eliquis is currently held by cardiology, to plan for a cardiac catheterization procedure. He is on dual antiplatelet therapy with aspirin and Plavix at this time. Clinically, no focal neurological deficit noted. For completion of acute stroke w/u, MRA head, Carotid USG and TTE are ordered, pending.  After completion of the cardiac catheterization procedure, recommend to restart eliquis and at that time discontinue both aspirin and Plavix to reduce the risk of bleeding associated with combination of such multiple therapies. His blood pressure has been better controlled now with medications. He reports good compliance with medicines.  Of note, given the tiny punctate size of the acute stroke, if cardiology service plans to to start heparin drip for anticoagulation indicated for cardiac event, there is no contraindication at this time to do so from neurology standpoint.   Discussed with primary hospitalist, Dr. Blaine Hamper.

## 2015-05-18 NOTE — Plan of Care (Signed)
Problem: Safety: Goal: Ability to remain free from injury will improve Outcome: Progressing Safe environment being provided per RN and patient care tech thus far this shift.

## 2015-05-18 NOTE — Care Management Note (Addendum)
Case Management Note  Patient Details  Name: [name]. MRN: [id number] Date of Birth: 01-22-1931  Subjective/Objective: Pt admitted for chest pain and found to be positive for Stroke. Pt is from home with wife.                     Action/Plan: CM will continue to monitor for disposition needs.    Expected Discharge Date:                  Expected Discharge Plan:  [place]  In-House Referral:     Discharge planning Services  CM Consult  Post Acute Care Choice:    Choice offered to:     DME Arranged:   N/A DME Agency:   N/A  HH Arranged:   N/A HH Agency:    N/A Status of Service: Completed. Medicare Important Message Given:    Date Medicare IM Given:    Medicare IM give by:    Date Additional Medicare IM Given:    Additional Medicare Important Message give by:     If discussed at [place] of Stay Meetings, dates discussed:    Additional Comments: 1119 05-19-15 Jacqlyn Krauss, RN, BSN [phone] CM did speak with pt in regards to [place]. Pt is from home with wife. Pt states he has telephonic CM call him for weights. Pt states he will not need any HH PT at this time. Pt refusing all services. No further needs from CM at this time.   Bethena Roys, RN 05/18/2015, 11:33 AM

## 2015-05-18 NOTE — Evaluation (Signed)
Physical Therapy Evaluation Patient Details Name: [name]. MRN: [id number] DOB: June 11, 1930 Today's Date: 05/18/2015   History of Present Illness  Pt admitted with chest pain, confusion, R hand numbness. Small acute ischemic CVA in R frontal lobe on MRI. PMH: afib, HTN, COPD, CAD with CABG, sleep apnea.  Clinical Impression  Pt admitted with above diagnosis. Pt currently with functional limitations due to the deficits listed below (see PT Problem List). [name] Ind PTA and goes to [employer] 2x/wk.  Currently presents w/ balance deficits w/ high level balance activities.  Pt will benefit from skilled PT to increase their independence and safety with mobility to allow discharge to the venue listed below.      Follow Up Recommendations Home health PT;Supervision for mobility/OOB (w/ focus on balance)    Equipment Recommendations  None recommended by PT    Recommendations for Other Services       Precautions / Restrictions Precautions Precautions: Fall Restrictions Weight Bearing Restrictions: No      Mobility  Bed Mobility Overal bed mobility: Modified Independent             General bed mobility comments: Pt sitting in recliner chair upon PT arrival  Transfers Overall transfer level: Needs assistance Equipment used: None Transfers: Sit to/from Stand Sit to Stand: Min guard         General transfer comment: No instability noted w/ sit<>stand, min guard provided for safety.  No cues or physical assist needed.  Ambulation/Gait Ambulation/Gait assistance: Min guard Ambulation Distance (Feet): 200 Feet Assistive device: None Gait Pattern/deviations: Step-through pattern;Decreased stride length;Wide base of support   Gait velocity interpretation: at or above normal speed for age/gender General Gait Details: Wide BOS but no instability noted, min instability noted w/ high level balance activities.  Min guard for pt's safety.    Stairs             Wheelchair Mobility    Modified Rankin (Stroke Patients Only)       Balance Overall balance assessment: Needs assistance;History of Falls (1 fall ~ 8 days ago, otherwise no falls for 5 years) Sitting-balance support: No upper extremity supported;Feet supported Sitting balance-Leahy Scale: Good     Standing balance support: No upper extremity supported;During functional activity Standing balance-Leahy Scale: Fair                               Pertinent Vitals/Pain Pain Assessment: No/denies pain    Home Living Family/patient expects to be discharged to:: Private residence Living Arrangements: Spouse/significant other Available Help at Discharge: Family;Available 24 hours/day (wife is limited assist due to her own heatlh issues) Type of Home: House Home Access: Stairs to enter Entrance Stairs-Rails: Right (wall on L) Entrance Stairs-Number of Steps: 2 Home Layout: One level Home Equipment: None      Prior Function Level of Independence: Independent         Comments: pt drives, likes to do yard work     Journalist, newspaper   Dominant Hand: Right    Extremity/Trunk Assessment   Upper Extremity Assessment: Defer to OT evaluation           Lower Extremity Assessment: RLE deficits/detail;LLE deficits/detail RLE Deficits / Details: strength grossly 4+/5 LLE Deficits / Details: strength grossly 4/5  Cervical / Trunk Assessment: Kyphotic  Communication   Communication: HOH (hearing aids are at home)  Cognition Arousal/Alertness: Awake/alert Behavior During Therapy: [facility] for tasks  assessed/performed Overall Cognitive Status: Within Functional Limits for tasks assessed                      General Comments      Exercises Other Exercises Other Exercises: sit<>Stand x 2      Assessment/Plan    PT Assessment Patient needs continued PT services  PT Diagnosis Acute pain;Difficulty walking   PT Problem List Decreased strength;Decreased  balance;Decreased safety awareness;Decreased knowledge of use of DME  PT Treatment Interventions DME instruction;Gait training;Stair training;Functional mobility training;Therapeutic activities;Therapeutic exercise;Balance training;Neuromuscular re-education;Patient/family education   PT Goals (Current goals can be found in the Care Plan section) Acute Rehab PT Goals Patient Stated Goal: go home  PT Goal Formulation: With patient Time For Goal Achievement: 05/28/15 Potential to Achieve Goals: Good    Frequency Min 3X/week   Barriers to discharge Inaccessible home environment 2 steps to enter home    Co-evaluation               End of Session Equipment Utilized During Treatment: Gait belt Activity Tolerance: Patient tolerated treatment well Patient left: in chair;with call bell/phone within reach;with nursing/sitter in room Nurse Communication: Mobility status         Time: 1043-1106 PT Time Calculation (min) (ACUTE ONLY): 23 min   Charges:   PT Evaluation $PT Eval Low Complexity: 1 Procedure PT Treatments $Gait Training: 8-22 mins   PT G CodesJoslyn Hy PT, DPT [phone] Pager: [phone] 05/18/2015, 11:17 AM

## 2015-05-18 NOTE — Plan of Care (Signed)
Problem: Education: Goal: Knowledge of disease or condition will improve Outcome: Progressing Detailed education given to patietn and family on Strokes, Afib, reasons he is taking certain medications. Patient able to teach back information. Goal: Knowledge of secondary prevention will improve Outcome: Progressing Educated patient on signs and symptoms of Stroke. Educated patient on risk factors of stroke. Educated patient on stroke prevention. Patient and family state better understanding.

## 2015-05-18 NOTE — Progress Notes (Signed)
    Subjective:  No CP or dyspnea this am. No palpitations.   Objective:  Vital Signs in the last 24 hours: Temp:  [97.5 F (36.4 C)-98.6 F (37 C)] 98.5 F (36.9 C) (02/07 0705) Pulse Rate:  [71-110] 83 (02/07 0905) Resp:  [13-20] 18 (02/07 0705) BP: (113-212)/(60-78) 169/65 mmHg (02/07 0905) SpO2:  [96 %-99 %] 99 % (02/07 0905) Weight:  [63.549 kg (140 lb 1.6 oz)-63.912 kg (140 lb 14.4 oz)] 63.549 kg (140 lb 1.6 oz) (02/07 0330)  Intake/Output from previous day: 02/06 0701 - 02/07 0700 In: 580.8 [P.O.:160; I.V.:420.8] Out: 725 [Urine:725]  Physical Exam: Pt is alert and oriented, pleasant elderly male in NAD HEENT: normal Neck: JVP - normal Lungs: CTA bilaterally CV: RRR with 2/6 harsh systolic murmur at the RUSB Abd: soft, NT, Positive BS, no hepatomegaly Ext: no C/C/E, distal pulses intact and equal Skin: warm/dry no rash   Lab Results:  Recent Labs  05/17/15 1258  WBC 6.5  HGB 12.6*  PLT 163    Recent Labs  05/17/15 1258  NA 132*  K 3.8  CL 98*  CO2 25  GLUCOSE 111*  BUN 18  CREATININE 0.93    Recent Labs  05/18/15 0302 05/18/15 0728  TROPONINI 0.03 <0.03    Tele: Sinus rhythm  Assessment/Plan:  1. Chest pain, resting angina: There is no real accelerating pattern. I do not think the patient has unstable angina. His cardiac markers are negative. I would favor ongoing medical therapy and I do not anticipate proceeding with an invasive evaluation. The patient can resume Eliquis.   2. PAF: Recent small stroke noted. Felt to be an incidental finding on MRI that does not correlate with clinical symptoms. Regardless of this, he requires ongoing anticoagulation as he is at high-risk for recurrent stroke.  3. Moderate aortic stenosis: This is been stable by echo findings.  4. Labile hypertension: In my opinion this is the major issue that has prompted the patient's hospitalization. We had a lengthy discussion today. He is monitoring his vital signs  excessively at home. He's checking his blood pressure and heart rate many times per day. When he sees an elevated blood pressure, he checks it hourly. We discussed the importance of reducing his home monitoring as it creates anxiety and perpetuates problems with his blood pressure. He has now been started on amlodipine in the hospital. I carefully reviewed his medications from outpatient visits. He's on a complex regimen with multi drug therapy. He has recently been on spur on lactone but in review of his labs his potassium has been as high as 6.0. This is appropriately been discontinued. Would continue current medicines and will keep on amlodipine which can be titrated from 5-10 mg if needed. I think he would benefit from another 24 hours in the hospital and anticipate discharge tomorrow as long as his blood pressure is stable.   Sherren Mocha, M.D. 05/18/2015, 9:57 AM

## 2015-05-18 NOTE — Progress Notes (Signed)
Utilization review completed. Jhania Etherington, RN, BSN. 

## 2015-05-18 NOTE — Progress Notes (Signed)
ANTICOAGULATION CONSULT NOTE - Initial Consult  Pharmacy Consult for Apixaban Indication: atrial fibrillation  Allergies  Allergen Reactions  . Ace Inhibitors Other (See Comments)    Severe asthma (COPD)  . Avelox [Moxifloxacin Hcl In Nacl] Other (See Comments)    Gum pain  . Tape Other (See Comments)    Adhesive and tape reaction-tears skin off   . Lidocaine Other (See Comments)    Possible reaction? Dizziness, confusion, syncope  . Morphine And Related Nausea And Vomiting  . Codeine Nausea And Vomiting  . Lasix [Furosemide] Itching and Swelling  . Other Other (See Comments)    Maple trees, allergy symptoms    Patient Measurements: Height: 5\' 3"  (160 cm) Weight: 140 lb 1.6 oz (63.549 kg) IBW/kg (Calculated) : 56.9 Heparin Dosing Weight:   Vital Signs: Temp: 98.5 F (36.9 C) (02/07 0705) Temp Source: Oral (02/07 0705) BP: 169/65 mmHg (02/07 0905) Pulse Rate: 83 (02/07 0905)  Labs:  Recent Labs  05/17/15 1258 05/17/15 2014 05/18/15 0302 05/18/15 0728  HGB 12.6*  --   --   --   HCT 36.8*  --   --   --   PLT 163  --   --   --   APTT  --  29  --   --   LABPROT  --  14.4  --   --   INR  --  1.11  --   --   CREATININE 0.93  --   --   --   TROPONINI  --  0.03 0.03 <0.03    Estimated Creatinine Clearance: 49.3 mL/min (by C-G formula based on Cr of 0.93).   Medical History: Past Medical History  Diagnosis Date  . CORONARY HEART DISEASE     a. s/p CABG;  b. cath 06/27/10: S-Dx occluded, S-PDA 80-90% (tx with PCI); S-OM ok, L-LAD ok;  EF 65-70%  c.  s/p Promus DES to S-PDA 06/2010;   d. Cath 8 2016 LIMA to the LAD patent, SVG to posterior lateral subtotal, SVG to diagonal occluded chronically. The patient had stenting of the native right vessel.  Marland Kitchen FIBRILLATION, ATRIAL   . SLEEP APNEA 09/2001    NPSG AHI 22/HR  . ASTHMA   . Depression   . GERD     with HH, hx esophageal stricture  . DYSLIPIDEMIA   . HYPERTENSION     Echo 3/12: EF 55-60%; mod LVH; mild AS/AI;  LAE; PASP 38; mild pulmo HTN  . Personal history of alcoholism ([place])   . Diverticulosis   . Benign liver cyst   . Skin cancer     L forearm  . Cataract     surgery to both eyes  . Esophageal stricture   . Hiatal hernia   . Pneumonia 04/2013 hosp  . Anxiety   . Arthritis   . CHF (congestive heart failure) ([place])   . COPD (chronic obstructive pulmonary disease) ([place])   . Glaucoma   . Osteoporosis   . Subclavian arterial stenosis ([facility])     Medications:  Prescriptions prior to admission  Medication Sig Dispense Refill Last Dose  . acetaminophen (TYLENOL) 325 MG tablet Take 650 mg by mouth every 6 (six) hours as needed for moderate pain or headache.    05/16/2015 at Unknown time  . albuterol (PROVENTIL HFA;VENTOLIN HFA) 108 (90 BASE) MCG/ACT inhaler Inhale 2 puffs into the lungs every 6 (six) hours as needed for wheezing or shortness of breath. 8 g 5 05/16/2015 at Unknown time  .  apixaban (ELIQUIS) 5 MG TABS tablet Take 1 tablet (5 mg total) by mouth 2 (two) times daily. 60 tablet 5 05/17/2015 at am  . budesonide (PULMICORT) 0.25 MG/2ML nebulizer solution Take 0.25 mg by nebulization 4 (four) times daily as needed (for SOB). For asthma   05/16/2015 at Unknown time  . calcium-vitamin D (OSCAL WITH D) 500-200 MG-UNIT per tablet Take 1 tablet by mouth 2 (two) times daily.   05/17/2015 at Unknown time  . cholestyramine (QUESTRAN) 4 G packet MIX AND DRINK 1 PACKET BY MOUTH TWICE A DAY IF NEEDED FOR LOOSE STOOLS (Patient taking differently: MIX AND DRINK 1.5 PACKET BY MOUTH ONCE DAILY) 60 packet 0 05/17/2015 at Unknown time  . cloNIDine (CATAPRES) 0.3 MG tablet take 1 tablet by mouth twice a day 60 tablet 6 05/17/2015 at AM  . clopidogrel (PLAVIX) 75 MG tablet Take 1 tablet (75 mg total) by mouth daily with breakfast. 30 tablet 03 05/17/2015 at Unknown time  . DULERA 100-5 MCG/ACT AERO inhale 2 puffs by mouth once daily (THEN RINSE MOUTH AFTER USE) (Patient taking differently: inhale 2 puffs by mouth in morning  (THEN RINSE MOUTH AFTER USE)) 17.6 g 5 05/16/2015 at Unknown time  . ipratropium-albuterol (DUONEB) 0.5-2.5 (3) MG/3ML SOLN USE ONE VIAL VIA NEBULIZER EVERY 6 HOURS AS NEEDED FOR SHORTNESS OF BREATH 360 mL 5 not used  . isosorbide mononitrate (IMDUR) 60 MG 24 hr tablet Take 2 tablets (120 mg total) by mouth daily. (Patient taking differently: Take 60 mg by mouth 2 (two) times daily. ) 180 tablet 3 05/17/2015 at am  . latanoprost (XALATAN) 0.005 % ophthalmic solution Place 1 drop into both eyes at bedtime.    05/16/2015 at Unknown time  . LORazepam (ATIVAN) 0.5 MG tablet Take 1 tablet (0.5 mg total) by mouth 3 (three) times daily. 90 tablet 5 05/17/2015 at Unknown time  . metoprolol tartrate (LOPRESSOR) 100 MG tablet Take 1 tablet (100 mg total) by mouth 2 (two) times daily. 60 tablet 11 05/17/2015 at 0400  . montelukast (SINGULAIR) 10 MG tablet Take 1 tablet (10 mg total) by mouth at bedtime. 90 tablet 3 05/16/2015 at Unknown time  . nitroGLYCERIN (NITROSTAT) 0.4 MG SL tablet Place 1 tablet (0.4 mg total) under the tongue every 5 (five) minutes as needed for chest pain. 25 tablet 0 05/17/2015 at Unknown time  . NONFORMULARY OR COMPOUNDED ITEM Allergy Vaccine 1:10 Given at [facility]   05/17/2015 at Unknown time  . pantoprazole (PROTONIX) 40 MG tablet take 1 tablet by mouth twice a day (Patient taking differently: take 40 mg by mouth twice a day) 60 tablet 11 05/17/2015 at Unknown time  . pravastatin (PRAVACHOL) 80 MG tablet take 1 tablet by mouth every morning 30 tablet 2 05/17/2015 at Unknown time  . Probiotic Product (PROBIOTIC DAILY) CAPS Take 1 capsule by mouth at bedtime.   05/16/2015 at Unknown time  . RA VITAMIN B-12 TR 1000 MCG TBCR take 1 tablet by mouth once daily (Patient taking differently: take 1000 mcg by mouth once daily) 30 tablet 11 05/17/2015 at Unknown time  . torsemide (DEMADEX) 20 MG tablet Take 20 mg by mouth daily.   05/17/2015 at Unknown time  . valsartan (DIOVAN) 320 MG tablet Take 1 tablet (320 mg total)  by mouth daily. (Patient taking differently: Take 320 mg by mouth every morning. ) 90 tablet 0 05/17/2015 at Unknown time   Scheduled:  . acidophilus  1 capsule Oral QPM  . amLODipine  5 mg  Oral Daily  . budesonide  0.25 mg Nebulization QHS  . calcium-vitamin D  1 tablet Oral BID  . cloNIDine  0.3 mg Oral BID  . clopidogrel  75 mg Oral Q breakfast  . irbesartan  300 mg Oral Daily  . isosorbide mononitrate  120 mg Oral Daily  . latanoprost  1 drop Both Eyes QHS  . LORazepam  0.5 mg Oral TID  . metoprolol tartrate  100 mg Oral BID  . mometasone-formoterol  2 puff Inhalation BID  . montelukast  10 mg Oral QHS  . pantoprazole  40 mg Oral BID  . pravastatin  80 mg Oral q morning - 10a  . vitamin B-12  1,000 mcg Oral Daily   Infusions:  . sodium chloride 50 mL/hr at 05/17/15 2235    Assessment: 82yo male with history of Afib on apixaban PTA presents with CP and acute punctate stroke. Pharmacy is consulted to dose apixaban for atrial fibrillation.   Goal of Therapy:  Monitor platelets by anticoagulation protocol: Yes   Plan:  Apixaban 5mg  PO BID Monitor s/sx of bleeding  Andrey Cota. Diona Foley, PharmD, BCPS Clinical Pharmacist Pager [phone] 05/18/2015,10:19 AM

## 2015-05-18 NOTE — Evaluation (Signed)
Occupational Therapy Evaluation Patient Details Name: [name]. MRN: [id number] DOB: 01-Jun-1930 Today's Date: 05/18/2015    History of Present Illness Pt admitted with chest pain, confusion, R hand numbness. Small acute ischemic CVA in R frontal lobe on MRI. PMH: afib, HTN, COPD, CAD with CABG, sleep apnea.   Clinical Impression   Pt was independent and active prior to admission. Presents with some balance deficits interfering with safety and requiring supervision for ADL and ADL transfers. R hand numbness, CP and confusion have resolved. Pt has a claw-foot tub at home. Will follow acutely. Do not anticipate pt will need follow up OT.   Follow Up Recommendations  No OT follow up    Equipment Recommendations       Recommendations for Other Services       Precautions / Restrictions Precautions Precautions: Fall      Mobility Bed Mobility Overal bed mobility: Modified Independent             General bed mobility comments: increased time, no assistance  Transfers   Equipment used: None                  Balance                                            ADL Overall ADL's : Needs assistance/impaired Eating/Feeding: Independent;Sitting   Grooming: Supervision/safety;Standing   Upper Body Bathing: Set up;Sitting   Lower Body Bathing: Supervison/ safety;Sit to/from stand   Upper Body Dressing : Set up;Sitting   Lower Body Dressing: Supervision/safety;Sit to/from stand   Toilet Transfer: Artist Details (indicate cue type and reason): simulated to chair Toileting- Clothing Manipulation and Hygiene: Supervision/safety       Functional mobility during ADLs: Supervision/safety       Vision     Perception     Praxis      Pertinent Vitals/Pain Pain Assessment: No/denies pain     Hand Dominance Right   Extremity/Trunk Assessment Upper Extremity Assessment Upper Extremity  Assessment: Overall WFL for tasks assessed (has trigger finger--R thumb, arthritic changes)   Lower Extremity Assessment Lower Extremity Assessment: Defer to PT evaluation       Communication Communication Communication: HOH (hearing aids are at home)   Cognition Arousal/Alertness: Awake/alert Behavior During Therapy: WFL for tasks assessed/performed Overall Cognitive Status: Within Functional Limits for tasks assessed                     General Comments       Exercises       Shoulder Instructions      Home Living Family/patient expects to be discharged to:: Private residence Living Arrangements: Spouse/significant other Available Help at Discharge: Family;Available 24 hours/day (wife is limited assist due to her own heatlh issues) Type of Home: House Home Access: Stairs to enter [employer] of Steps: 2 Entrance Stairs-Rails: Right (wall on L) Home Layout: One level     Bathroom Shower/Tub: Teacher, early years/pre: Standard     Home Equipment: None          Prior Functioning/Environment Level of Independence: Independent        Comments: pt drives, likes to do yard work    OT Diagnosis: Generalized weakness   OT Problem List: Impaired balance (sitting and/or standing);Decreased knowledge of use of DME or AE  OT Treatment/Interventions: Self-care/ADL training;DME and/or AE instruction    OT Goals(Current goals can be found in the care plan section) Acute Rehab OT Goals Patient Stated Goal: go home  OT Goal Formulation: With patient Time For Goal Achievement: 05/25/15 Potential to Achieve Goals: Good ADL Goals Pt Will Perform Grooming: Independently;standing Pt Will Transfer to Toilet: Independently;ambulating Pt Will Perform Toileting - Clothing Manipulation and hygiene: Independently;sit to/from stand Pt Will Perform Tub/Shower Transfer: Tub transfer;with modified independence;ambulating (pt will be aware of grab bars  and shower seat for safety )  OT Frequency: Min 2X/week   Barriers to D/C:            Co-evaluation              End of Session Equipment Utilized During Treatment: Gait belt Nurse Communication:  (ok to feed)  Activity Tolerance: Patient tolerated treatment well Patient left: in chair;with nursing/sitter in room   Time: 1016-1046 OT Time Calculation (min): 30 min Charges:  OT General Charges $OT Visit: 1 Procedure OT Evaluation $OT Eval Moderate Complexity: 1 Procedure OT Treatments $Self Care/Home Management : 8-22 mins G-Codes:    Malka So 05/18/2015, 11:06 AM  [phone]

## 2015-05-18 NOTE — Progress Notes (Signed)
STROKE TEAM PROGRESS NOTE   HISTORY OF PRESENT ILLNESS [name]. is an 82 y.o. male patient with Known A. fib, on eliquis and Plavix at home, poorly controlled hypertension, admitted for further cardiac evaluation due to chest pain. He also reported some confusion and right hand numbness at admission. Hence brain MRI study was done as part of workup by the hospitalist team. It showed a tiny punctate acute infarcts in the right frontal white matter. Based on the size of this punctate infarct, likely to be asymptomatic incidental finding secondary to atrial fibrillation causing cerebral microemboli. Eliquis was held by cardiology planning for cardiac catheterization procedure. His been placed now on dual antiplatelet aspirin and Plavix the hospitalist. Patient denies any neurological symptoms. No vision speech problems or focal sensorimotor symptoms. Last known well is unknown. Patient was not administered TPA secondary to unknown last known well.    SUBJECTIVE (INTERVAL HISTORY) His friend who works at the hospital is at the bedside.  Overall he feels his condition is completely resolved.    OBJECTIVE Temp:  [97.5 F (36.4 C)-98.6 F (37 C)] 98.5 F (36.9 C) (02/07 0705) Pulse Rate:  [71-110] 83 (02/07 0905) Cardiac Rhythm:  [-] Heart block;Normal sinus rhythm (02/07 0943) Resp:  [13-20] 18 (02/07 0705) BP: (113-212)/(60-78) 169/65 mmHg (02/07 0905) SpO2:  [96 %-99 %] 99 % (02/07 0905) Weight:  [63.549 kg (140 lb 1.6 oz)-63.912 kg (140 lb 14.4 oz)] 63.549 kg (140 lb 1.6 oz) (02/07 0330)  CBC:   Recent Labs Lab 05/17/15 1258  WBC 6.5  HGB 12.6*  HCT 36.8*  MCV 91.8  PLT [id number]    Basic Metabolic Panel:   Recent Labs Lab 05/17/15 1258  NA 132*  K 3.8  CL 98*  CO2 25  GLUCOSE 111*  BUN 18  CREATININE 0.93  CALCIUM 8.8*    Lipid Panel:     Component Value Date/Time   CHOL 122 05/18/2015 0302   TRIG 41 05/18/2015 0302   TRIG 53 07/12/2009   HDL 47 05/18/2015 0302    CHOLHDL 2.6 05/18/2015 0302   VLDL 8 05/18/2015 0302   LDLCALC 67 05/18/2015 0302   HgbA1c:  Lab Results  Component Value Date   HGBA1C 6.4 10/16/2012   Urine Drug Screen:     Component Value Date/Time   LABOPIA NONE DETECTED 03/09/2013 0046   COCAINSCRNUR NONE DETECTED 03/09/2013 0046   LABBENZ NONE DETECTED 03/09/2013 0046   AMPHETMU NONE DETECTED 03/09/2013 0046   THCU NONE DETECTED 03/09/2013 0046   LABBARB NONE DETECTED 03/09/2013 0046      IMAGING  Dg Chest 2 View  05/17/2015  CLINICAL DATA:  Chest pain 2 nights ago. EXAM: CHEST  2 VIEW COMPARISON:  05/07/2015 FINDINGS: Prior CABG. Heart is upper limits normal in size. No confluent airspace opacities or effusions. Calcifications in the aorta. No acute bony abnormality. IMPRESSION: No active cardiopulmonary disease. Electronically Signed   By: Rolm Baptise M.D.   On: 05/17/2015 13:34   Mr Brain Wo Contrast  05/17/2015  CLINICAL DATA:  Intermittent chest pain beginning yesterday. Cough. Intermittent RIGHT hand numbness for several months with thumb contracture. Intermittent confusion. History of atrial fibrillation, hypertension, dyslipidemia. EXAM: MRI HEAD WITHOUT CONTRAST TECHNIQUE: Multiplanar, multiecho pulse sequences of the brain and surrounding structures were obtained without intravenous contrast. COMPARISON:  CT head October 07, 2011 and MRI of the brain December 15, 2007 FINDINGS: The ventricles and sulci are normal for patient's age. No abnormal parenchymal signal, mass  lesions, mass effect. Patchy supratentorial white matter FLAIR T2 hyperintensities. Punctate focus of reduced diffusion RIGHT frontal lobe deep white matter, due to small size, difficult to evaluate on ADC map. Tiny foci of susceptibility artifact biparietal lobes are nonspecific, can be seen with chronic hypertension. No susceptibility artifact to suggest lobar hematoma. No abnormal extra-axial fluid collections. No extra-axial masses though, contrast  enhanced sequences would be more sensitive. Normal major intracranial vascular flow voids seen at the skull base. Status post bilateral ocular lens implants. No abnormal sellar expansion. No suspicious calvarial bone marrow signal. Craniocervical junction maintained. Visualized paranasal sinuses and mastoid air cells are well-aerated. Patient appears edentulous. IMPRESSION: Punctate probable acute ischemia RIGHT frontal lobe deep white matter. Involutional changes. Mild to moderate chronic small vessel ischemic disease. Electronically Signed   By: Elon Alas M.D.   On: 05/17/2015 21:06       PHYSICAL EXAM Pleasant frail elderly Caucasian male currently not in distress. . Afebrile. Head is nontraumatic. Neck is supple without bruit.    Cardiac exam no murmur or gallop. Lungs are clear to auscultation. Distal pulses are well felt. Neurological Exam :  Level of alertness: Alert,  Oriented to time, place and person Speech: fluent, no evidence of dysarthria or aphasia noted.  Test the following cranial nerves: 2-12 grossly intact Motor examination: Normal tone, bulk, full 5/5 motor strength in all 4 extremities Examination of sensation : symmetric sensation to pinprick in all 4 extremities and on face Examination of deep tendon reflexes: 2+, normal and symmetric in all extremities, normal plantars bilaterally Test coordination: Normal finger nose testing, with no evidence of limb appendicular ataxia  Gait: Deferred ASSESSMENT/PLAN [name]. is a 82 y.o. male with history of atrial fibrillation on Eliquis and Plavix, poorly controlled hypertension, admitted for cardiac evaluation due to chest pain. MRI done as part of workup showed tiny punctate right frontal white matter infarct. He was not considered for IV t-PA due to unknown last known well.   Stroke:  Incidental right frontal white matter infarct, embolic secondary to known atrial fibrillation  Resultant  Incidental stroke  found not associated with presenting sx of right hand numbness  MRI  Tiny right frontal lobe deep white matter infarct  Cancel stroke workup  LDL 67  HgbA1c pending  eliquis for VTE prophylaxis Diet Heart Room service appropriate?: Yes; Fluid consistency:: Thin  clopidogrel 75 mg daily and Eliquis (apixaban) daily prior to admission, now on aspirin 81 mg daily and clopidogrel 75 mg daily. Caridiology held Eliquis for likely cath, now will not pursue cath, so okayed to resume Eliquis.  Patient has not missed any doses of anticoagulation at home  Ongoing aggressive stroke risk factor management  Therapy recommendations:  No therapy needs  Disposition:  Return home  Atrial Fibrillation  Home anticoagulation:  Eliquis (apixaban) daily , currently on hold for cardiac cath  Okay for IV heparin from neurology standpoint if needed  Continue Eliquis at discharge   Essential Hypertension  Stable  Hyperlipidemia  Home meds:  Pravachol 80 mg daily, resumed in hospital  LDL 67, goal < 70  Continue statin at discharge  Other Stroke Risk Factors  Advanced age  Former Cigarette smoker, quit smoking 36 years ago   Hx ETOH abuse  Coronary artery disease with chest pain this admission status post CABG 2008 and stent  Obstructive sleep apnea, on CPAP at home, refused CPAP in-hospital  Other Active Problems  Pneumonia  Right hand numbness thought  to have a trigger thumb   Acute encephalopathy  Chronic diastolic congestive heart failure  Hyponatremia  GERD  NOTHING FURTHER TO ADD FROM THE STROKE STANDPOINT  Ongoing risk factor control by Primary Care Physician  Stroke Service will sign off. Please call should any needs arise.  No neurology followup recommended at this time. [name] Kitchen   Hospital day # [place] for Pager information 05/18/2015 10:35 AM  I have personally examined this patient, reviewed notes, independently  viewed imaging studies, participated in medical decision making and plan of care. I have made any additions or clarifications directly to the above note. Agree with note above. He was admitted for chest pain evaluation but developed some transient confusion in right hand numbness and MRI scan of the brain shows tiny incidental right frontal white matter infarct which cannot explain his symptoms. He does have atrial fibrillation and this likely represents a silent infarct. He remains at risk for neurological worsening, recurrent stroke, TIA and continue anticoagulation with eliquis and aggressive risk factor control. Antony Contras, MD Medical Director [facility] Pager: [phone] 05/18/2015 2:46 PM    To contact Stroke Continuity provider, please refer to [email or web address] After hours, contact General Neurology

## 2015-05-18 NOTE — Plan of Care (Signed)
Problem: Education: Goal: Knowledge of [facility] Education information/materials will improve Outcome: Progressing RN explained to patient how to use his call light.  Patient at high risk for falls per assessment.  Fall risk measures in place.  RN explained to patient the use of the yellow socks identifying him as a high fall risk and the implementation of bed alarm.  RN also explained to patient that he needed to call and wait for assistance before getting out of bed.  Patient stated understanding and has made no attempts thus far to get out of bed unassisted.  Review of plan discussed with patient.  Patient stated understanding.  RN reviewed all medications with patient and patient's spouse earlier prior to administering the medications.  Patient has denied pain thus far this shift.  RN explained to patient if he started to have pain to notify immediately.  Patient stated understanding.

## 2015-05-19 DIAGNOSIS — E785 Hyperlipidemia, unspecified: Secondary | ICD-10-CM

## 2015-05-19 LAB — HEMOGLOBIN A1C
HEMOGLOBIN A1C: 5.9 % — AB (ref 4.8–5.6)
Mean Plasma Glucose: 123 mg/dL

## 2015-05-19 LAB — GLUCOSE, CAPILLARY: Glucose-Capillary: 96 mg/dL (ref 65–99)

## 2015-05-19 MED ORDER — ISOSORBIDE MONONITRATE ER 60 MG PO TB24
60.0000 mg | ORAL_TABLET | Freq: Two times a day (BID) | ORAL | Status: DC
  Administered 2015-05-19: 60 mg via ORAL

## 2015-05-19 MED ORDER — AMLODIPINE BESYLATE 5 MG PO TABS: 5.0000 mg | ORAL_TABLET | Freq: Every day | ORAL | Status: DC

## 2015-05-19 MED ORDER — ISOSORBIDE MONONITRATE ER 60 MG PO TB24: 60.0000 mg | ORAL_TABLET | Freq: Two times a day (BID) | ORAL | Status: AC

## 2015-05-19 NOTE — Plan of Care (Signed)
Problem: Tissue Perfusion: Goal: Complications of Ischemic Stroke will be minimized. Outcome: Progressing Neurological exam remains at baseline for patient.  No nausea or vomiting thus far this shift.  Patient has denied pain thus far this shift.

## 2015-05-19 NOTE — Discharge Summary (Signed)
PATIENT DETAILS Name: [name]. Age: 82 y.o. Sex: male Date of Birth: 09-06-1930 MRN: [id number]. Admitting Physician: Ivor Costa, MD [id number] J Burns, MD  Admit Date: 05/17/2015 Discharge date: 05/19/2015  Recommendations for Outpatient Follow-up:  1. Optimize anti-hypertensive 2. Ensure follow up with cardiology 3. Please repeat CBC/BMET at next visit  PRIMARY DISCHARGE DIAGNOSIS:  Principal Problem:   Chest pain Active Problems:   Hyperlipidemia with target LDL less than 100   Coronary atherosclerosis   Asthma with COPD ([place])   ESOPHAGEAL STRICTURE   GERD   Obstructive sleep apnea   Bruit of left carotid artery   Osteoporosis/severe osteopenia   Chronic diastolic heart failure (HCC)   PAF (paroxysmal atrial fibrillation) (HCC)   Anemia, iron deficiency   Generalized anxiety disorder   Hx of CABG 2008   CAD S/P S-PDA DES 2012   Murmur   NSTEMI (non-ST elevated myocardial infarction) ([place])   Subclavian artery stenosis, left   PAD (peripheral artery disease) (HCC)   Acute encephalopathy   Numbness and tingling in right hand   Hyponatremia   Coronary artery disease involving native coronary artery of native heart   Cerebral embolism with cerebral infarction      PAST MEDICAL HISTORY: Past Medical History  Diagnosis Date  . CORONARY HEART DISEASE     a. s/p CABG;  b. cath 06/27/10: S-Dx occluded, S-PDA 80-90% (tx with PCI); S-OM ok, L-LAD ok;  EF 65-70%  c.  s/p Promus DES to S-PDA 06/2010;   d. Cath 8 2016 LIMA to the LAD patent, SVG to posterior lateral subtotal, SVG to diagonal occluded chronically. The patient had stenting of the native right vessel.  Marland Kitchen FIBRILLATION, ATRIAL   . SLEEP APNEA 09/2001    NPSG AHI 22/HR  . ASTHMA   . Depression   . GERD     with HH, hx esophageal stricture  . DYSLIPIDEMIA   . HYPERTENSION     Echo 3/12: EF 55-60%; mod LVH; mild AS/AI; LAE; PASP 38; mild pulmo HTN  .  Personal history of alcoholism ([place])   . Diverticulosis   . Benign liver cyst   . Skin cancer     L forearm  . Cataract     surgery to both eyes  . Esophageal stricture   . Hiatal hernia   . Pneumonia 04/2013 hosp  . Anxiety   . Arthritis   . CHF (congestive heart failure) ([place])   . COPD (chronic obstructive pulmonary disease) ([place])   . Glaucoma   . Osteoporosis   . Subclavian arterial stenosis (HCC)     DISCHARGE MEDICATIONS: Current Discharge Medication List    START taking these medications   Details  amLODipine (NORVASC) 5 MG tablet Take 1 tablet (5 mg total) by mouth daily. Qty: 30 tablet, Refills: 0      CONTINUE these medications which have CHANGED   Details  isosorbide mononitrate (IMDUR) 60 MG 24 hr tablet Take 1 tablet (60 mg total) by mouth 2 (two) times daily. Qty: 180 tablet, Refills: 0      CONTINUE these medications which have NOT CHANGED   Details  acetaminophen (TYLENOL) 325 MG tablet Take 650 mg by mouth every 6 (six) hours as needed for moderate pain or headache.     albuterol (PROVENTIL HFA;VENTOLIN HFA) 108 (90 BASE) MCG/ACT inhaler Inhale 2 puffs into the lungs every 6 (six) hours as needed for wheezing or shortness of breath. Qty: 8 g, Refills: 5  apixaban (ELIQUIS) 5 MG TABS tablet Take 1 tablet (5 mg total) by mouth 2 (two) times daily. Qty: 60 tablet, Refills: 5    budesonide (PULMICORT) 0.25 MG/2ML nebulizer solution Take 0.25 mg by nebulization 4 (four) times daily as needed (for SOB). For asthma    calcium-vitamin D (OSCAL WITH D) 500-200 MG-UNIT per tablet Take 1 tablet by mouth 2 (two) times daily.    cholestyramine (QUESTRAN) 4 G packet MIX AND DRINK 1 PACKET BY MOUTH TWICE A DAY IF NEEDED FOR LOOSE STOOLS Qty: 60 packet, Refills: 0    cloNIDine (CATAPRES) 0.3 MG tablet take 1 tablet by mouth twice a day Qty: 60 tablet, Refills: 6    clopidogrel (PLAVIX) 75 MG tablet Take 1 tablet (75 mg total) by mouth daily with breakfast. Qty:  30 tablet, Refills: 03    DULERA 100-5 MCG/ACT AERO inhale 2 puffs by mouth once daily (THEN RINSE MOUTH AFTER USE) Qty: 17.6 g, Refills: 5    ipratropium-albuterol (DUONEB) 0.5-2.5 (3) MG/3ML SOLN USE ONE VIAL VIA NEBULIZER EVERY 6 HOURS AS NEEDED FOR SHORTNESS OF BREATH Qty: 360 mL, Refills: 5    latanoprost (XALATAN) 0.005 % ophthalmic solution Place 1 drop into both eyes at bedtime.     LORazepam (ATIVAN) 0.5 MG tablet Take 1 tablet (0.5 mg total) by mouth 3 (three) times daily. Qty: 90 tablet, Refills: 5    metoprolol tartrate (LOPRESSOR) 100 MG tablet Take 1 tablet (100 mg total) by mouth 2 (two) times daily. Qty: 60 tablet, Refills: 11    montelukast (SINGULAIR) 10 MG tablet Take 1 tablet (10 mg total) by mouth at bedtime. Qty: 90 tablet, Refills: 3    nitroGLYCERIN (NITROSTAT) 0.4 MG SL tablet Place 1 tablet (0.4 mg total) under the tongue every 5 (five) minutes as needed for chest pain. Qty: 25 tablet, Refills: 0    NONFORMULARY OR COMPOUNDED ITEM Allergy Vaccine 1:10 Given at Home    pantoprazole (PROTONIX) 40 MG tablet take 1 tablet by mouth twice a day Qty: 60 tablet, Refills: 11    pravastatin (PRAVACHOL) 80 MG tablet take 1 tablet by mouth every morning Qty: 30 tablet, Refills: 2    Probiotic Product (PROBIOTIC DAILY) CAPS Take 1 capsule by mouth at bedtime.    RA VITAMIN B-12 TR 1000 MCG TBCR take 1 tablet by mouth once daily Qty: 30 tablet, Refills: 11    torsemide (DEMADEX) 20 MG tablet Take 20 mg by mouth daily.    valsartan (DIOVAN) 320 MG tablet Take 1 tablet (320 mg total) by mouth daily. Qty: 90 tablet, Refills: 0      STOP taking these medications     lidocaine (XYLOCAINE) 2 % solution         ALLERGIES:   Allergies  Allergen Reactions  . Ace Inhibitors Other (See Comments)    Severe asthma (COPD)  . Avelox [Moxifloxacin Hcl In Nacl] Other (See Comments)    Gum pain  . Tape Other (See Comments)    Adhesive and tape reaction-tears skin  off   . Lidocaine Other (See Comments)    Possible reaction? Dizziness, confusion, syncope  . Morphine And Related Nausea And Vomiting  . Codeine Nausea And Vomiting  . Lasix [Furosemide] Itching and Swelling  . Other Other (See Comments)    Maple trees, allergy symptoms    BRIEF HPI:  See H&P, Labs, Consult and Test reports for all details in brief, 27 -year-old white male with history of CAD, HTN, PAF on  eliquis presented with anginal pain as well as intermittent confusion and right hand numbness  CONSULTATIONS:   cardiology   Neurology  PERTINENT RADIOLOGIC STUDIES: Dg Chest 2 View  05/17/2015  CLINICAL DATA:  Chest pain 2 nights ago. EXAM: CHEST  2 VIEW COMPARISON:  05/07/2015 FINDINGS: Prior CABG. Heart is upper limits normal in size. No confluent airspace opacities or effusions. Calcifications in the aorta. No acute bony abnormality. IMPRESSION: No active cardiopulmonary disease. Electronically Signed   By: Rolm Baptise M.D.   On: 05/17/2015 13:34   Dg Chest 2 View  05/07/2015  CLINICAL DATA:  Aspiration pneumonia. EXAM: CHEST  2 VIEW COMPARISON:  April 13, 2015. FINDINGS: The heart size and mediastinal contours are within normal limits. Both lungs are clear. No pneumothorax or pleural effusion is noted. Status post coronary artery bypass graft. Atherosclerosis of descending thoracic aorta is noted. The visualized skeletal structures are unremarkable. IMPRESSION: No active cardiopulmonary disease. Electronically Signed   By: Marijo Conception, M.D.   On: 05/07/2015 13:36   Mr Brain Wo Contrast  05/17/2015  CLINICAL DATA:  Intermittent chest pain beginning yesterday. Cough. Intermittent RIGHT hand numbness for several months with thumb contracture. Intermittent confusion. History of atrial fibrillation, hypertension, dyslipidemia. EXAM: MRI HEAD WITHOUT CONTRAST TECHNIQUE: Multiplanar, multiecho pulse sequences of the brain and surrounding structures were obtained without intravenous  contrast. COMPARISON:  CT head October 07, 2011 and MRI of the brain December 15, 2007 FINDINGS: The ventricles and sulci are normal for patient's age. No abnormal parenchymal signal, mass lesions, mass effect. Patchy supratentorial white matter FLAIR T2 hyperintensities. Punctate focus of reduced diffusion RIGHT frontal lobe deep white matter, due to small size, difficult to evaluate on ADC map. Tiny foci of susceptibility artifact biparietal lobes are nonspecific, can be seen with chronic hypertension. No susceptibility artifact to suggest lobar hematoma. No abnormal extra-axial fluid collections. No extra-axial masses though, contrast enhanced sequences would be more sensitive. Normal major intracranial vascular flow voids seen at the skull base. Status post bilateral ocular lens implants. No abnormal sellar expansion. No suspicious calvarial bone marrow signal. Craniocervical junction maintained. Visualized paranasal sinuses and mastoid air cells are well-aerated. Patient appears edentulous. IMPRESSION: Punctate probable acute ischemia RIGHT frontal lobe deep white matter. Involutional changes. Mild to moderate chronic small vessel ischemic disease. Electronically Signed   By: Elon Alas M.D.   On: 05/17/2015 21:06     PERTINENT LAB RESULTS: CBC:  Recent Labs  05/17/15 1258  WBC 6.5  HGB 12.6*  HCT 36.8*  PLT 163   CMET CMP     Component Value Date/Time   NA 132* 05/17/2015 1258   K 3.8 05/17/2015 1258   CL 98* 05/17/2015 1258   CO2 25 05/17/2015 1258   GLUCOSE 111* 05/17/2015 1258   BUN 18 05/17/2015 1258   CREATININE 0.93 05/17/2015 1258   CREATININE 1.04 05/03/2015 0750   CALCIUM 8.8* 05/17/2015 1258   PROT 6.6 03/30/2015 0832   ALBUMIN 4.0 03/30/2015 0832   AST 20 03/30/2015 0832   ALT 16 03/30/2015 0832   ALKPHOS 63 03/30/2015 0832   BILITOT 0.5 03/30/2015 0832   GFRNONAA >60 05/17/2015 1258   GFRNONAA 62 03/30/2015 0832   GFRAA >60 05/17/2015 1258   GFRAA 72  03/30/2015 0832    GFR Estimated Creatinine Clearance: 49.3 mL/min (by C-G formula based on Cr of 0.93). No results for input(s): LIPASE, AMYLASE in the last 72 hours.  Recent Labs  05/17/15 2014 05/18/15 0302 05/18/15  0728  TROPONINI 0.03 0.03 <0.03   Invalid input(s): POCBNP No results for input(s): DDIMER in the last 72 hours.  Recent Labs  05/18/15 0302  HGBA1C 5.9*    Recent Labs  05/18/15 0302  CHOL 122  HDL 47  LDLCALC 67  TRIG 41  CHOLHDL 2.6   No results for input(s): TSH, T4TOTAL, T3FREE, THYROIDAB in the last 72 hours.  Invalid input(s): FREET3 No results for input(s): VITAMINB12, FOLATE, FERRITIN, TIBC, IRON, RETICCTPCT in the last 72 hours. Coags:  Recent Labs  05/17/15 2014  INR 1.11   Microbiology: No results found for this or any previous visit (from the past 240 hour(s)).   BRIEF HOSPITAL COURSE:   Principal Problem: Chest pain:Cardiac enzymes were negative, seen by cardiology-no further inpatient recommendations. Stable for discharge today. Continue Plavix and beta blocker and statins. Last PCI to RCA in August 2016.  Active Problems:  Acute CVA: Seen by neurology, felt to be mostly a incidental finding-her eyes does not correlate with symptoms. Recommendations are to continue liquids. No further workup recommended by neurology.  Paroxysmal atrial fibrillation:CHADSVASC of at least 4. Continue Eliquis  Hypertension: Blood pressure continues to fluctuate-but much better over the past few days. Amlodipine added, continue metoprolol, Imdur, hydralazine, clonidine, Demadex on discharge. Will need gradual optimization in the outpatient setting.  History of CAD status post CABG 2008, status post recent PCI August 2016: See above.Spoke with Dr Bennett Scrape continue Plavix on discharge. No bleeding complications.  Mild hyponatremia: Continue Demadex. Follow closely in the outpatient setting.  Dyslipidemia: Continue statin-LDL 67.  Chronic  diastolic heart failure: Clinically compensated, continue diuretics and metoprolol. Follow.   TODAY-DAY OF DISCHARGE:  Subjective:   [name] today has no headache,no chest abdominal pain,no new weakness tingling or numbness, feels much better wants to go home today.   Objective:   Blood pressure 159/51, pulse 87, temperature 97.8 F (36.6 C), temperature source Oral, resp. rate 18, height 5\' 3"  (1.6 m), weight 63.095 kg (139 lb 1.6 oz), SpO2 100 %.  Intake/Output Summary (Last 24 hours) at 05/19/15 1109 Last data filed at 05/18/15 2241  Gross per 24 hour  Intake   1020 ml  Output      0 ml  Net   1020 ml   Filed Weights   05/17/15 2136 05/18/15 0330 05/19/15 0502  Weight: 63.912 kg (140 lb 14.4 oz) 63.549 kg (140 lb 1.6 oz) 63.095 kg (139 lb 1.6 oz)    Exam Awake Alert, Oriented *3, No new F.N deficits, Normal affect [place].AT,PERRAL Supple Neck,No JVD, No cervical lymphadenopathy appriciated.  Symmetrical Chest wall movement, Good air movement bilaterally, CTAB RRR,No Gallops,Rubs or new Murmurs, No Parasternal Heave +ve B.Sounds, Abd Soft, Non tender, No organomegaly appriciated, No rebound -guarding or rigidity. No Cyanosis, Clubbing or edema, No new Rash or bruise  DISCHARGE CONDITION: Stable  DISPOSITION: Home  DISCHARGE INSTRUCTIONS:    Activity:  As tolerated   Get Medicines reviewed and adjusted: Please take all your medications with you for your next visit with your Primary MD  Please request your Primary MD to go over all hospital tests and procedure/radiological results at the follow up, please ask your Primary MD to get all Hospital records sent to his/her office.  If you experience worsening of your admission symptoms, develop shortness of breath, life threatening emergency, suicidal or homicidal thoughts you must seek medical attention immediately by calling 911 or calling your MD immediately  if symptoms less severe.  You must  read complete  instructions/literature along with all the possible adverse reactions/side effects for all the Medicines you take and that have been prescribed to you. Take any new Medicines after you have completely understood and accpet all the possible adverse reactions/side effects.   Do not drive when taking Pain medications.   Do not take more than prescribed Pain, Sleep and Anxiety Medications  Special Instructions: If you have smoked or chewed Tobacco  in the last 2 yrs please stop smoking, stop any regular Alcohol  and or any Recreational drug use.  Wear Seat belts while driving.  Please note  You were cared for by a hospitalist during your hospital stay. Once you are discharged, your primary care physician will handle any further medical issues. Please note that NO REFILLS for any discharge medications will be authorized once you are discharged, as it is imperative that you return to your primary care physician (or establish a relationship with a primary care physician if you do not have one) for your aftercare needs so that they can reassess your need for medications and monitor your lab values.   Diet recommendation: Heart Healthy diet  Discharge Instructions    Diet - low sodium heart healthy    Complete by:  As directed      Increase activity slowly    Complete by:  As directed            Follow-up Information    Follow up with Binnie Rail, MD. Schedule an appointment as soon as possible for a visit in 1 week.   Specialty:  Internal Medicine   Why:  Hospital follow up   Contact information:   [place] [address] [phone]       Follow up with Sherren Mocha, MD. Schedule an appointment as soon as possible for a visit in 2 weeks.   Specialty:  Cardiology   Why:  Hospital follow up   Contact information:   [id number] [place]  [address] [phone]      Total Time spent on discharge equals  45  minutes.  SignedOren Binet 05/19/2015 11:09 AM

## 2015-05-19 NOTE — Progress Notes (Signed)
SLP Cancellation Note  Patient Details Name: [name]. MRN: [id number] DOB: [birth year]   Cancelled treatment:       Reason Eval/Treat Not Completed: SLP screened, no needs identified, will sign off   Raneem Mendolia, Katherene Ponto 05/19/2015, 10:28 AM

## 2015-05-19 NOTE — Plan of Care (Signed)
Problem: Tissue Perfusion: Goal: Cerebral tissue perfusion will improve Outcome: Progressing No signs or symptoms of increased intracranial pressure noted.  Patient has been alert and oriented x4 thus far this shift.

## 2015-05-19 NOTE — Progress Notes (Signed)
Pt given all d/c instructions and scripts, verbalized understanding, d/c w family at main entrance

## 2015-05-19 NOTE — Consult Note (Signed)
   [facility] CM Inpatient Consult   05/19/2015  [name]. 07-Apr-1931 [id number] Patient was screened for [facility] services.  Spoke with inpatient RNCM, Hassan Rowan who thought the patient could benefit.  Patient admitted with Stroke with a history of COPD and HX of HF.  Went by to see patient who was sound asleep and no family available to speak with in the room.  Will follow up for Care Management needs. Please contact: Natividad Brood, RN BSN [facility] Liaison  [phone] business mobile phone Toll free office [phone]

## 2015-05-19 NOTE — Progress Notes (Signed)
Occupational Therapy Treatment Patient Details Name: [name]. MRN: [id number] DOB: Jan 21, 1931 Today's Date: 05/19/2015    History of present illness Pt admitted with chest pain, confusion, R hand numbness. Small acute ischemic CVA in R frontal lobe on MRI. PMH: afib, HTN, COPD, CAD with CABG, sleep apnea.   OT comments  Focus of session on educating pt in fall prevention during ADL. Pt agreeable to grab bar and shower seat for his clawfoot tub at home, friend in room stating he will help pt set up. Educated pt in general fall prevention.  Pt is eager to discharge home today.  Follow Up Recommendations  No OT follow up    Equipment Recommendations       Recommendations for Other Services      Precautions / Restrictions Precautions Precautions: Fall       Mobility Bed Mobility               General bed mobility comments: pt seated EOB  Transfers   Equipment used: None Transfers: Sit to/from Stand Sit to Stand: Modified independent (Device/Increase time)         General transfer comment: from bed and toilet    Balance                                   ADL Overall ADL's : Needs assistance/impaired     Grooming: Standing;Independent                   Toilet Transfer: Modified Independent;Ambulation;Comfort height toilet   Toileting- Clothing Manipulation and Hygiene: Modified independent;Sit to/from stand   Tub/ Shower Transfer: Min guard;Ambulation   Functional mobility during ADLs: Modified independent (within room) General ADL Comments: Recommended pt purchase tub seat (preferably [employer]x type) and grab bar for edge of tub and showed pt pictures.  Pt in agreement.  Friend in room and will help pt set up. Educated pt in home safety and fall prevention.      Vision                     Perception     Praxis      Cognition   Behavior During Therapy: WFL for tasks assessed/performed Overall Cognitive Status:  Within Functional Limits for tasks assessed                       Extremity/Trunk Assessment               Exercises     Shoulder Instructions       General Comments      Pertinent Vitals/ Pain       Pain Assessment: No/denies pain  Home Living                                          Prior Functioning/Environment              Frequency Min 2X/week     Progress Toward Goals  OT Goals(current goals can now be found in the care plan section)  Progress towards OT goals: Progressing toward goals  Acute Rehab OT Goals Patient Stated Goal: go home   Plan Discharge plan remains appropriate    Co-evaluation  End of Session Equipment Utilized During Treatment: Gait belt   Activity Tolerance Patient tolerated treatment well   Patient Left in bed;with restraints reapplied;with call bell/phone within reach (EOB)   Nurse Communication          Time: [id number] OT Time Calculation (min): 31 min  Charges: OT General Charges $OT Visit: 1 Procedure OT Treatments $Self Care/Home Management : 23-37 mins  Malka So 05/19/2015, 10:56 AM  [phone]

## 2015-05-19 NOTE — Progress Notes (Signed)
    Subjective:  Resting comfortably. Denies chest pain or shortness of breath.  Objective:  Vital Signs in the last 24 hours: Temp:  [97.4 F (36.3 C)-98.6 F (37 C)] 97.8 F (36.6 C) (02/08 0900) Pulse Rate:  [70-87] 87 (02/08 0920) Resp:  [17-19] 18 (02/08 0900) BP: (127-175)/(47-74) 159/51 mmHg (02/08 0920) SpO2:  [96 %-100 %] 100 % (02/08 0900) Weight:  [63.095 kg (139 lb 1.6 oz)] 63.095 kg (139 lb 1.6 oz) (02/08 0502)  Intake/Output from previous day: 02/07 0701 - 02/08 0700 In: 1395.8 [P.O.:1260; I.V.:135.8] Out: 300 [Urine:300]  Physical Exam: Pt is alert and oriented,Elderly male in NAD HEENT: normal Neck: JVP - normal Lungs: CTA bilaterally CV: RRR with 2/6 harsh mid-peaking systolic murmur at the right upper sternal border Abd: soft, NT, Positive BS, no hepatomegaly Ext: no C/C/E, distal pulses intact and equal Skin: warm/dry no rash   Lab Results:  Recent Labs  05/17/15 1258  WBC 6.5  HGB 12.6*  PLT 163    Recent Labs  05/17/15 1258  NA 132*  K 3.8  CL 98*  CO2 25  GLUCOSE 111*  BUN 18  CREATININE 0.93    Recent Labs  05/18/15 0302 05/18/15 0728  TROPONINI 0.03 <0.03    Tele: Personally reviewed: Normal sinus rhythm heart rate about 60 bpm  Assessment/Plan:  1. Chest pain, resolved: No evidence of unstable angina. Cardiac markers negative. Ongoing medical therapy is advisable. Amlodipine has been added to his regimen. He will remain on a combination of Eliquis for chronic oral anticoagulation in the setting of paroxysmal atrial fibrillation, and antiplatelet therapy with clopidogrel considering his history of saphenous vein graft stenting. He's had no bleeding events over the last 6 months of this combination of medication.  2. Paroxysmal atrial fibrillation: Continue anticoagulation as above. Maintaining sinus rhythm.  3. Moderate aortic stenosis: Stable exam and findings by echo studies.  4. Labile hypertension: Amlodipine has been  added. The patient has been counseled about reducing the frequency of home blood pressure monitoring. He will bring readings in to his follow-up visit with Dr. Percival Spanish.  Sherren Mocha, M.D. 05/19/2015, 11:54 AM

## 2015-05-20 ENCOUNTER — Telehealth: Payer: Self-pay | Admitting: *Deleted

## 2015-05-20 ENCOUNTER — Encounter: Payer: Self-pay | Admitting: *Deleted

## 2015-05-20 ENCOUNTER — Telehealth: Payer: Self-pay | Admitting: Internal Medicine

## 2015-05-20 ENCOUNTER — Other Ambulatory Visit: Payer: Self-pay | Admitting: *Deleted

## 2015-05-20 DIAGNOSIS — Z006 Encounter for examination for normal comparison and control in clinical research program: Secondary | ICD-10-CM

## 2015-05-20 MED ORDER — CHOLESTYRAMINE 4 G PO PACK: PACK | ORAL | Status: DC

## 2015-05-20 MED ORDER — APIXABAN 5 MG PO TABS: 5.0000 mg | ORAL_TABLET | Freq: Two times a day (BID) | ORAL | Status: AC

## 2015-05-20 NOTE — Telephone Encounter (Signed)
Transition Care Management Follow-up Telephone Call   Date discharged? 05/19/15   How have you been since you were released from the hospital? Spoke with wife/pt she states he is doing alright   Do you understand why you were in the hospital? YES   Do you understand the discharge instructions? YES  Where were you discharged to? Home   Items Reviewed:  Medications reviewed: YES  Allergies reviewed: YES  Dietary changes reviewed: NO  Referrals reviewed: No referral needed   Functional Questionnaire:   Activities of Daily Living (ADLs):   She states he are independent in the following: ambulation, bathing and hygiene, feeding, continence, grooming, toileting and dressing States he doesn't require assistance   Any transportation issues/concerns?: NO   Any patient concerns? NO   Confirmed importance and date/time of follow-up visits scheduled YES, hosp had made appt for 05/28/15, but pt rather wait to see Dr. Quay Burow on 06/02/15  Provider Appointment booked with Dr. Quay Burow  Confirmed with patient if condition begins to worsen call PCP or go to the ER.  Patient was given the office number and encouraged to call back with question or concerns.  : YES

## 2015-05-20 NOTE — Telephone Encounter (Signed)
Refilled Questran

## 2015-05-20 NOTE — Progress Notes (Signed)
AVIATOR II research 6 month follow up obtained by recent hospital records.

## 2015-05-20 NOTE — Telephone Encounter (Signed)
Just wanted to make sure you got this.

## 2015-05-26 ENCOUNTER — Other Ambulatory Visit: Payer: Self-pay | Admitting: Internal Medicine

## 2015-05-28 ENCOUNTER — Inpatient Hospital Stay: Payer: Medicare Other | Admitting: Internal Medicine

## 2015-06-02 ENCOUNTER — Encounter: Payer: Self-pay | Admitting: Internal Medicine

## 2015-06-02 ENCOUNTER — Other Ambulatory Visit (INDEPENDENT_AMBULATORY_CARE_PROVIDER_SITE_OTHER): Payer: Medicare Other

## 2015-06-02 ENCOUNTER — Ambulatory Visit (INDEPENDENT_AMBULATORY_CARE_PROVIDER_SITE_OTHER): Payer: Medicare Other | Admitting: Internal Medicine

## 2015-06-02 VITALS — BP 174/78 | HR 82 | Temp 98.2°F | Resp 16 | Wt 150.0 lb

## 2015-06-02 DIAGNOSIS — D509 Iron deficiency anemia, unspecified: Secondary | ICD-10-CM

## 2015-06-02 DIAGNOSIS — Z9861 Coronary angioplasty status: Secondary | ICD-10-CM | POA: Diagnosis not present

## 2015-06-02 DIAGNOSIS — I2510 Atherosclerotic heart disease of native coronary artery without angina pectoris: Secondary | ICD-10-CM | POA: Diagnosis not present

## 2015-06-02 DIAGNOSIS — F411 Generalized anxiety disorder: Secondary | ICD-10-CM

## 2015-06-02 DIAGNOSIS — M810 Age-related osteoporosis without current pathological fracture: Secondary | ICD-10-CM

## 2015-06-02 DIAGNOSIS — K219 Gastro-esophageal reflux disease without esophagitis: Secondary | ICD-10-CM

## 2015-06-02 DIAGNOSIS — I63131 Cerebral infarction due to embolism of right carotid artery: Secondary | ICD-10-CM

## 2015-06-02 DIAGNOSIS — I10 Essential (primary) hypertension: Secondary | ICD-10-CM

## 2015-06-02 LAB — CBC WITH DIFFERENTIAL/PLATELET
BASOS ABS: 0 10*3/uL (ref 0.0–0.1)
Basophils Relative: 0.5 % (ref 0.0–3.0)
EOS PCT: 0.4 % (ref 0.0–5.0)
Eosinophils Absolute: 0 10*3/uL (ref 0.0–0.7)
HCT: 38.1 % — ABNORMAL LOW (ref 39.0–52.0)
HEMOGLOBIN: 13.1 g/dL (ref 13.0–17.0)
LYMPHS ABS: 2 10*3/uL (ref 0.7–4.0)
Lymphocytes Relative: 21.8 % (ref 12.0–46.0)
MCHC: 34.5 g/dL (ref 30.0–36.0)
MCV: 90.2 fl (ref 78.0–100.0)
MONOS PCT: 8.4 % (ref 3.0–12.0)
Monocytes Absolute: 0.8 10*3/uL (ref 0.1–1.0)
NEUTROS PCT: 68.9 % (ref 43.0–77.0)
Neutro Abs: 6.4 10*3/uL (ref 1.4–7.7)
Platelets: 220 10*3/uL (ref 150.0–400.0)
RBC: 4.23 Mil/uL (ref 4.22–5.81)
RDW: 14.6 % (ref 11.5–15.5)
WBC: 9.4 10*3/uL (ref 4.0–10.5)

## 2015-06-02 LAB — HEMOGLOBIN A1C: HEMOGLOBIN A1C: 5.7 % (ref 4.6–6.5)

## 2015-06-02 NOTE — Patient Instructions (Signed)
Hold the norvasc for now.  Continue your other medications as prescribed.  Check your BP every 4 hrs and write down every number.    Have blood work done.  Follow up in 1 week.  Call with any questions.

## 2015-06-02 NOTE — Assessment & Plan Note (Signed)
Blood pressure not sufficiently controlled on current regimen His blood pressure seems to be dropping too low at times causing him to skip taking his medication, resulting in spikes in his blood pressure He feels this started after the Norvasc was added to his regimen - hold norvasc for now.  He may need this to be added back at a lower dose, 2.5 mg He will continue his other medication as scheduled He will monitor his blood pressure every 4 hours during the day and record it every reading Check CMP today Follow up in one week

## 2015-06-02 NOTE — Progress Notes (Signed)
Pre visit review using our clinic review tool, if applicable. No additional management support is needed unless otherwise documented below in the visit note. 

## 2015-06-02 NOTE — Addendum Note (Signed)
Addended by: Binnie Rail on: 06/02/2015 08:05 PM   Modules accepted: Level of Service

## 2015-06-02 NOTE — Assessment & Plan Note (Signed)
05/2015: Presented with intermittent confusion, right hand numbness. No treatment at presentation MRI showed infarct, but did not match symptoms most likely incidental At hospital follow up if some intermittent confusion, but no weakness/numbness or other symptoms

## 2015-06-02 NOTE — Progress Notes (Signed)
Subjective:    Patient ID: Stephen Door., male    DOB: [birth year], 82 y.o.   MRN: [id number]  HPI He is here for follow-up from his recent hospitalization.  He went to [facility] with chest pain,  intermittent confusion and right hand numbness.  He saw cardio and neuro.  He did have a stroke, but it was thought to be an incidental finding because his current symptoms did not really have much where his stroke was.  His blood pressure was elevated while he was in the hospital and amlodipine was added to his regimen.  Since being home from the hospital he denies any numbness or weakness in his arms or legs. He denies any episodes of chest pain, palpitations or shortness of breath. He has not had any lower extremity edema. He still feels there is some confusion, which his wife agrees.  His blood pressure at home has remained variable:152/79, 172/83, 175/80, 149/80, 195/73. He has been taking his blood pressure approximately every 2 hours and has not brought all of his readings. He states there has been several times where his blood pressure has been low-111/? Or 96/?Marland Kitchen Typically he states this happens at night. As a result of this he does not take any of his nighttime medications. If his blood pressure is low in the morning when he wakes he will not take his morning medication until the evening before taking his morning medication.  He thinks in the past week he has skipped his medication at least 3 times. By the time he does take his medication his blood pressure is very elevated.   He currently takes his medication as follows:   Morning:  norvasc 5 mg Clonidine 0.3 Imdur 60 mg  Lopressor 100mg   diovan 320 mg torsemid 20 mg  Night: Clonidine 0.3 imdur 60 mg Lopressor 100 mg  He is compliant with a low-sodium diet. He is trying to increase his activity. Since being in the hospital a few times over the last few months he feels he has become much weaker and therefore sedentary.   Medications  and allergies reviewed with patient and updated if appropriate.  Patient Active Problem List   Diagnosis Date Noted  . Cerebral embolism with cerebral infarction 05/18/2015  . Coronary artery disease involving native coronary artery of native heart   . Acute encephalopathy 05/17/2015  . Numbness and tingling in right hand 05/17/2015  . Hyponatremia 05/17/2015  . Pain in the chest   . Essential hypertension   . Intermittent confusion   . Cough 04/13/2015  . PAD (peripheral artery disease) ([place]) 02/22/2015  . Subclavian artery stenosis, left 12/23/2014  . NSTEMI (non-ST elevated myocardial infarction) ([place])   . Chest pain 11/25/2014  . Tachycardia 11/24/2014  . Hx of CABG 2008 08/31/2014  . CAD S/P S-PDA DES 2012 08/31/2014  . PVD (peripheral vascular disease) ([place]) 08/31/2014  . Murmur 08/31/2014  . Anxiety about health 08/31/2014  . Edema 07/27/2014  . Hypertensive urgency 07/20/2014  . Accelerated hypertension 04/22/2014  . Abnormal chest x-ray 03/26/2014  . Abnormal CT scan, esophagus 01/07/2014  . Generalized anxiety disorder   . Anemia, iron deficiency   . PAF (paroxysmal atrial fibrillation) ([place]) 03/08/2013  . Chronic diastolic heart failure ([place]) 10/30/2012  . Osteoporosis/severe osteopenia 06/12/2012  . Bruit of left carotid artery 09/27/2011  . GERD 11/19/2009  . Hyperlipidemia with target LDL less than 100 10/04/2009  . ESOPHAGEAL STRICTURE 12/03/2007  . Coronary atherosclerosis 05/10/2007  .  Seasonal and perennial allergic rhinitis 05/10/2007  . Asthma with COPD ([place]) 05/10/2007  . Obstructive sleep apnea 05/10/2007    Current Outpatient Prescriptions on File Prior to Visit  Medication Sig Dispense Refill  . acetaminophen (TYLENOL) 325 MG tablet Take 650 mg by mouth every 6 (six) hours as needed for moderate pain or headache.     . albuterol (PROVENTIL HFA;VENTOLIN HFA) 108 (90 BASE) MCG/ACT inhaler Inhale 2 puffs into the lungs every 6 (six) hours as needed  for wheezing or shortness of breath. 8 g 5  . amLODipine (NORVASC) 5 MG tablet Take 1 tablet (5 mg total) by mouth daily. 30 tablet 0  . apixaban (ELIQUIS) 5 MG TABS tablet Take 1 tablet (5 mg total) by mouth 2 (two) times daily. 60 tablet 11  . budesonide (PULMICORT) 0.25 MG/2ML nebulizer solution Take 0.25 mg by nebulization 4 (four) times daily as needed (for SOB). For asthma    . calcium-vitamin D (OSCAL WITH D) 500-200 MG-UNIT per tablet Take 1 tablet by mouth 2 (two) times daily.    . cholestyramine (QUESTRAN) 4 g packet MIX AND DRINK 1 PACKET BY MOUTH TWICE A DAY IF NEEDED FOR LOOSE STOOLS 60 packet 0  . cloNIDine (CATAPRES) 0.3 MG tablet take 1 tablet by mouth twice a day 60 tablet 6  . clopidogrel (PLAVIX) 75 MG tablet Take 1 tablet (75 mg total) by mouth daily with breakfast. 30 tablet 03  . DULERA 100-5 MCG/ACT AERO inhale 2 puffs by mouth once daily (THEN RINSE MOUTH AFTER USE) (Patient taking differently: inhale 2 puffs by mouth in morning (THEN RINSE MOUTH AFTER USE)) 17.6 g 5  . ipratropium-albuterol (DUONEB) 0.5-2.5 (3) MG/3ML SOLN USE ONE VIAL VIA NEBULIZER EVERY 6 HOURS AS NEEDED FOR SHORTNESS OF BREATH 360 mL 5  . isosorbide mononitrate (IMDUR) 60 MG 24 hr tablet Take 1 tablet (60 mg total) by mouth 2 (two) times daily. 180 tablet 0  . latanoprost (XALATAN) 0.005 % ophthalmic solution Place 1 drop into both eyes at bedtime.     Marland Kitchen LORazepam (ATIVAN) 0.5 MG tablet take 1 tablet by mouth three times a day 90 tablet 0  . metoprolol tartrate (LOPRESSOR) 100 MG tablet Take 1 tablet (100 mg total) by mouth 2 (two) times daily. 60 tablet 11  . montelukast (SINGULAIR) 10 MG tablet Take 1 tablet (10 mg total) by mouth at bedtime. 90 tablet 3  . nitroGLYCERIN (NITROSTAT) 0.4 MG SL tablet Place 1 tablet (0.4 mg total) under the tongue every 5 (five) minutes as needed for chest pain. 25 tablet 0  . NONFORMULARY OR COMPOUNDED ITEM Allergy Vaccine 1:10 Given at Home    . pantoprazole  (PROTONIX) 40 MG tablet take 1 tablet by mouth twice a day (Patient taking differently: take 40 mg by mouth twice a day) 60 tablet 11  . pravastatin (PRAVACHOL) 80 MG tablet take 1 tablet by mouth every morning 30 tablet 2  . Probiotic Product (PROBIOTIC DAILY) CAPS Take 1 capsule by mouth at bedtime.    Marland Kitchen RA VITAMIN B-12 TR 1000 MCG TBCR take 1 tablet by mouth once daily (Patient taking differently: take 1000 mcg by mouth once daily) 30 tablet 11  . torsemide (DEMADEX) 20 MG tablet Take 20 mg by mouth daily.    . valsartan (DIOVAN) 320 MG tablet Take 1 tablet (320 mg total) by mouth daily. (Patient taking differently: Take 320 mg by mouth every morning. ) 90 tablet 0   No current facility-administered medications  on file prior to visit.    Past Medical History  Diagnosis Date  . CORONARY HEART DISEASE     a. s/p CABG;  b. cath 06/27/10: S-Dx occluded, S-PDA 80-90% (tx with PCI); S-OM ok, L-LAD ok;  EF 65-70%  c.  s/p Promus DES to S-PDA 06/2010;   d. Cath 8 2016 LIMA to the LAD patent, SVG to posterior lateral subtotal, SVG to diagonal occluded chronically. The patient had stenting of the native right vessel.  Marland Kitchen FIBRILLATION, ATRIAL   . SLEEP APNEA 09/2001    NPSG AHI 22/HR  . ASTHMA   . Depression   . GERD     with HH, hx esophageal stricture  . DYSLIPIDEMIA   . HYPERTENSION     Echo 3/12: EF 55-60%; mod LVH; mild AS/AI; LAE; PASP 38; mild pulmo HTN  . Personal history of alcoholism ([place])   . Diverticulosis   . Benign liver cyst   . Skin cancer     L forearm  . Cataract     surgery to both eyes  . Esophageal stricture   . Hiatal hernia   . Pneumonia 04/2013 hosp  . Anxiety   . Arthritis   . CHF (congestive heart failure) ([place])   . COPD (chronic obstructive pulmonary disease) ([place])   . Glaucoma   . Osteoporosis   . Subclavian arterial stenosis [facility])     Past Surgical History  Procedure Laterality Date  . Coronary artery bypass graft  02/2007  . Tonsillectomy    . Coronary  angioplasty with stent placement  07/2010  . Cataract extraction, bilateral  2007  . Hernia repair  unsure ?60's  . Cholecystectomy  02/21/2011    Procedure: LAPAROSCOPIC CHOLECYSTECTOMY WITH INTRAOPERATIVE CHOLANGIOGRAM;  Surgeon: Pedro Earls, MD;  Location: [facility];  Service: General;  Laterality: N/A;  . Cardiac catheterization N/A 11/26/2014    Procedure: Left Heart Cath and Cors/Grafts Angiography;  Surgeon: Sherren Mocha, MD;  Location: [place] [facility];  Service: Cardiovascular;  Laterality: N/A;  . Cardiac catheterization N/A 11/26/2014    Procedure: Coronary Stent Intervention;  Surgeon: Sherren Mocha, MD;  Location: [place] CV LAB;  Service: Cardiovascular;  Laterality: N/A;  . Peripheral vascular catheterization N/A 02/22/2015    Procedure: Upper Extremity Angiography;  Surgeon: Lorretta Harp, MD;  Location: [place] CV LAB;  Service: Cardiovascular;  Laterality: N/A;    Social History   Social History  . Marital Status: Married    Spouse Name: N/A  . Number of Children: 2  . Years of Education: N/A   Occupational History  . retired     12 years   Social History Main Topics  . Smoking status: Former Smoker -- 3.00 packs/day for 40 years    Types: Cigarettes    Quit date: 04/11/1979  . Smokeless tobacco: Never Used  . Alcohol Use: No     Comment: quit drinking 40+ years  . Drug Use: No  . Sexual Activity: Yes   Other Topics Concern  . 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 Married lives with wife 2 children    Family History  Problem Relation Age of Onset  . COPD Sister   . Asthma Sister   . Emphysema Sister   . Hypertension Sister   . Hyperlipidemia Sister   . Hypertension Mother   . Heart disease Mother   . Hyperlipidemia Mother   . Colon cancer Neg Hx   .  Heart disease Brother   . Heart disease Father   . Hyperlipidemia Father   . Hypertension Father     Review of Systems  Constitutional: Negative for  fever and chills.  HENT: Negative for trouble swallowing.   Respiratory: Negative for cough, shortness of breath and wheezing.   Cardiovascular: Negative for chest pain, palpitations and leg swelling.  Neurological: Positive for headaches (with high BP). Negative for dizziness, speech difficulty, weakness (no localized), light-headedness and numbness.  Psychiatric/Behavioral: Positive for confusion.       Objective:   Filed Vitals:   06/02/15 1611  BP: 174/78  Pulse: 82  Temp: 98.2 F (36.8 C)  Resp: 16   Filed Weights   06/02/15 1611  Weight: 150 lb (68.04 kg)   Body mass index is 26.58 kg/(m^2).   Physical Exam Constitutional: Appears well-developed and well-nourished. No distress.  Neck: Neck supple. No tracheal deviation present. No thyromegaly present.  No carotid bruit. No cervical adenopathy.   Cardiovascular: Normal rate, regular rhythm and normal heart sounds.   No murmur heard.  No edema Pulmonary/Chest: Effort normal and breath sounds normal. No respiratory distress. No wheezes.  Neuro:  Normal strength all extremities, normal sensation in all extremities, cranial nerves without deficit        Assessment & Plan:   See Problem List for Assessment and Plan of chronic medical problems.  Recheck CMP, CBC given slight abnormalities in the hospital and he will have this done today  Follow-up in one week, call sooner if any questions

## 2015-06-03 LAB — LIPID PANEL
CHOL/HDL RATIO: 3
CHOLESTEROL: 128 mg/dL (ref 0–200)
HDL: 37 mg/dL — AB (ref >39.00)
LDL Cholesterol: 72 mg/dL (ref 0–99)
NonHDL: 90.69
TRIGLYCERIDES: 95 mg/dL (ref 0.0–149.0)
VLDL: 19 mg/dL (ref 0.0–40.0)

## 2015-06-03 LAB — COMPREHENSIVE METABOLIC PANEL
ALBUMIN: 4.3 g/dL (ref 3.5–5.2)
ALK PHOS: 59 U/L (ref 39–117)
ALT: 16 U/L (ref 0–53)
AST: 21 U/L (ref 0–37)
BILIRUBIN TOTAL: 0.7 mg/dL (ref 0.2–1.2)
BUN: 19 mg/dL (ref 6–23)
CO2: 33 mEq/L — ABNORMAL HIGH (ref 19–32)
Calcium: 9.7 mg/dL (ref 8.4–10.5)
Chloride: 93 mEq/L — ABNORMAL LOW (ref 96–112)
Creatinine, Ser: 1.01 mg/dL (ref 0.40–1.50)
GFR: 75.12 mL/min (ref >60.00)
GLUCOSE: 102 mg/dL — AB (ref 70–99)
Potassium: 4.4 mEq/L (ref 3.5–5.1)
SODIUM: 131 meq/L — AB (ref 135–145)
TOTAL PROTEIN: 7.3 g/dL (ref 6.0–8.3)

## 2015-06-03 LAB — IRON: IRON: 69 ug/dL (ref 42–165)

## 2015-06-03 LAB — TSH: TSH: 1.77 u[IU]/mL (ref 0.35–4.50)

## 2015-06-03 LAB — FERRITIN: FERRITIN: 69.4 ng/mL (ref 22.0–322.0)

## 2015-06-03 NOTE — Progress Notes (Signed)
HPI The patient presents for follow up of  CAD, s/p CABG, s/p Promus DES to S-PDA in 06/2010.  He was hospitalizedlast year with hypertensive urgency and NSTEMI.  He had subtotal stenosis of the vein graft to posterior lateral off the right coronary. He had stenting of the native right coronary.  The patient also was found to have subclavian stenosis.   He was later admitted for PCI of the subclavian.  He did have medication changes to include the addition of spironolactone and increased metoprolol.  He had an ileus postprocedure as well.  He was hospitalized a few weeks ago with chest pain. Enzymes were negative. There was a question of him having a CVA that he was seen by neurology and it was not clear that it had an acute event. He did have mild hyponatremia which has since been followed up. His sodium was 131 a couple of days ago. Of note his blood pressure was elevated and amlodipine was added at discharge. However, he reports he's had some low blood pressures. His primary care doctor held his beta blocker the other day because of these. However, his blood pressure readings that he brings today have actually been elevated. He has had no cardiovascular symptoms. He denies chest pressure, neck or arm discomfort. He's not had any new palpitations, presyncope or syncope. He   Allergies  Allergen Reactions  . Ace Inhibitors Other (See Comments)    Severe asthma (COPD)  . Avelox [Moxifloxacin Hcl In Nacl] Other (See Comments)    Gum pain  . Tape Other (See Comments)    Adhesive and tape reaction-tears skin off   . Lidocaine Other (See Comments)    Possible reaction? Dizziness, confusion, syncope  . Morphine And Related Nausea And Vomiting  . Codeine Nausea And Vomiting  . Lasix [Furosemide] Itching and Swelling  . Other Other (See Comments)    Maple trees, allergy symptoms    Current Outpatient Prescriptions  Medication Sig Dispense Refill  . acetaminophen (TYLENOL) 325 MG tablet Take 650 mg  by mouth every 6 (six) hours as needed for moderate pain or headache.     . albuterol (PROVENTIL HFA;VENTOLIN HFA) 108 (90 BASE) MCG/ACT inhaler Inhale 2 puffs into the lungs every 6 (six) hours as needed for wheezing or shortness of breath. 8 g 5  . apixaban (ELIQUIS) 5 MG TABS tablet Take 1 tablet (5 mg total) by mouth 2 (two) times daily. 60 tablet 11  . budesonide (PULMICORT) 0.25 MG/2ML nebulizer solution Take 0.25 mg by nebulization 4 (four) times daily as needed (for SOB). For asthma    . calcium-vitamin D (OSCAL WITH D) 500-200 MG-UNIT per tablet Take 1 tablet by mouth 2 (two) times daily.    . cholestyramine (QUESTRAN) 4 g packet MIX AND DRINK 1 PACKET BY MOUTH TWICE A DAY IF NEEDED FOR LOOSE STOOLS 60 packet 0  . cloNIDine (CATAPRES) 0.3 MG tablet take 1 tablet by mouth twice a day 60 tablet 6  . clopidogrel (PLAVIX) 75 MG tablet Take 1 tablet (75 mg total) by mouth daily with breakfast. 30 tablet 03  . DULERA 100-5 MCG/ACT AERO inhale 2 puffs by mouth once daily (THEN RINSE MOUTH AFTER USE) (Patient taking differently: inhale 2 puffs by mouth in morning (THEN RINSE MOUTH AFTER USE)) 17.6 g 5  . ipratropium-albuterol (DUONEB) 0.5-2.5 (3) MG/3ML SOLN USE ONE VIAL VIA NEBULIZER EVERY 6 HOURS AS NEEDED FOR SHORTNESS OF BREATH 360 mL 5  . isosorbide mononitrate (IMDUR) 60  MG 24 hr tablet Take 1 tablet (60 mg total) by mouth 2 (two) times daily. 180 tablet 0  . latanoprost (XALATAN) 0.005 % ophthalmic solution Place 1 drop into both eyes at bedtime.     Marland Kitchen LORazepam (ATIVAN) 0.5 MG tablet take 1 tablet by mouth three times a day 90 tablet 0  . metoprolol tartrate (LOPRESSOR) 100 MG tablet Take 1 tablet (100 mg total) by mouth 2 (two) times daily. 60 tablet 11  . montelukast (SINGULAIR) 10 MG tablet Take 1 tablet (10 mg total) by mouth at bedtime. 90 tablet 3  . nitroGLYCERIN (NITROSTAT) 0.4 MG SL tablet Place 1 tablet (0.4 mg total) under the tongue every 5 (five) minutes as needed for chest pain.  25 tablet 0  . NONFORMULARY OR COMPOUNDED ITEM Allergy Vaccine 1:10 Given at Home    . pantoprazole (PROTONIX) 40 MG tablet take 1 tablet by mouth twice a day (Patient taking differently: take 40 mg by mouth twice a day) 60 tablet 11  . pravastatin (PRAVACHOL) 80 MG tablet take 1 tablet by mouth every morning 30 tablet 2  . Probiotic Product (PROBIOTIC DAILY) CAPS Take 1 capsule by mouth at bedtime.    Marland Kitchen RA VITAMIN B-12 TR 1000 MCG TBCR take 1 tablet by mouth once daily (Patient taking differently: take 1000 mcg by mouth once daily) 30 tablet 11  . torsemide (DEMADEX) 20 MG tablet Take 20 mg by mouth daily.    . valsartan (DIOVAN) 320 MG tablet Take 1 tablet (320 mg total) by mouth daily. (Patient taking differently: Take 320 mg by mouth every morning. ) 90 tablet 0  . amLODipine (NORVASC) 2.5 MG tablet Take 1 tablet (2.5 mg total) by mouth daily. 30 tablet 11   No current facility-administered medications for this visit.    Past Medical History  Diagnosis Date  . CORONARY HEART DISEASE     a. s/p CABG;  b. cath 06/27/10: S-Dx occluded, S-PDA 80-90% (tx with PCI); S-OM ok, L-LAD ok;  EF 65-70%  c.  s/p Promus DES to S-PDA 06/2010;   d. Cath 8 2016 LIMA to the LAD patent, SVG to posterior lateral subtotal, SVG to diagonal occluded chronically. The patient had stenting of the native right vessel.  Marland Kitchen FIBRILLATION, ATRIAL   . SLEEP APNEA 09/2001    NPSG AHI 22/HR  . ASTHMA   . Depression   . GERD     with HH, hx esophageal stricture  . DYSLIPIDEMIA   . HYPERTENSION     Echo 3/12: EF 55-60%; mod LVH; mild AS/AI; LAE; PASP 38; mild pulmo HTN  . Personal history of alcoholism ([place])   . Diverticulosis   . Benign liver cyst   . Skin cancer     L forearm  . Cataract     surgery to both eyes  . Esophageal stricture   . Hiatal hernia   . Pneumonia 04/2013 hosp  . Anxiety   . Arthritis   . CHF (congestive heart failure) ([place])   . COPD (chronic obstructive pulmonary disease) ([place])   .  Glaucoma   . Osteoporosis   . Subclavian arterial stenosis [facility])     Past Surgical History  Procedure Laterality Date  . Coronary artery bypass graft  02/2007  . Tonsillectomy    . Coronary angioplasty with stent placement  07/2010  . Cataract extraction, bilateral  2007  . Hernia repair  unsure ?60's  . Cholecystectomy  02/21/2011    Procedure: LAPAROSCOPIC CHOLECYSTECTOMY WITH  INTRAOPERATIVE CHOLANGIOGRAM;  Surgeon: Pedro Earls, MD;  [facility];  Service: General;  Laterality: N/A;  . Cardiac catheterization N/A 11/26/2014    Procedure: Left Heart Cath and Cors/Grafts Angiography;  Surgeon: Sherren Mocha, MD;  Location: [place] CV LAB;  Service: Cardiovascular;  Laterality: N/A;  . Cardiac catheterization N/A 11/26/2014    Procedure: Coronary Stent Intervention;  Surgeon: Sherren Mocha, MD;  Location: [place] CV LAB;  Service: Cardiovascular;  Laterality: N/A;  . Peripheral vascular catheterization N/A 02/22/2015    Procedure: Upper Extremity Angiography;  Surgeon: Lorretta Harp, MD;  Location: [place] CV LAB;  Service: Cardiovascular;  Laterality: N/A;    ROS:  As stated in the HPI and negative for all other systems.  PHYSICAL EXAM BP 126/64 mmHg  Pulse 64  Ht 5\' 2"  (1.575 m)  Wt 149 lb 8 oz (67.813 kg)  BMI 27.34 kg/m2 GENERAL:  Chronically ill appearing but in no acute distress HEENT:  Pupils equal round and reactive, fundi not visualized, oral mucosa unremarkable, dentures NECK:  No jugular venous distention, waveform within normal limits, carotid upstroke brisk and symmetric, left carotid bruits, no thyromegaly LUNGS:  Clear to auscultation bilaterally CHEST:  Well healed sternotomy scar. HEART:  PMI not displaced or sustained,S1 and S2 within normal limits, no S3, no S4, no clicks, no rubs, soft apical, right upper sterna border early peaking murmur ABD:  Flat, positive bowel sounds normal in frequency in pitch, no bruits, no rebound, no guarding,  no midline pulsatile mass, no hepatomegaly, no splenomegaly, healed abdominal scars. EXT:  2 plus pulses throughout, mild bilateral edema, no cyanosis no clubbing, right bruit, right ecchymosis with slight hematoma but no pulsatile mass SKIN:  No nodules  ASSESSMENT AND PLAN  CAD:  The patient has no new sypmtoms.  There was no objective evidence of ischemia when he was hospitalized recently. No change in therapy is indicated. He is now on Plavix as well as Eliquis  ATRIAL FIBRILLATION:    He has no Paroxysmal symptoms.  He will continue with Eliqus as above  HTN: His blood pressure is for the most part elevated. I will have him start taking his Norvasc 2.5 mg and check his blood pressure 4 times a day for the next couple of weeks. Further adjustments will be based on this. I did give them instructions about when to hold the medication as they were concerned about this.  HYPONATREMIA:  This is stable. We talked about free water restriction.  CAROTID STENOSIS:  I reviewed the results from last year he had 40-59% bilateral stenosis.  I will follow-up on this.   AS:  This was moderate on echo earlier this year. We will follow this clinically.  Hospital records reviewed.

## 2015-06-04 ENCOUNTER — Encounter: Payer: Self-pay | Admitting: Cardiology

## 2015-06-04 ENCOUNTER — Ambulatory Visit (INDEPENDENT_AMBULATORY_CARE_PROVIDER_SITE_OTHER): Payer: Medicare Other | Admitting: Cardiology

## 2015-06-04 VITALS — BP 126/64 | HR 64 | Ht 62.0 in | Wt 149.5 lb

## 2015-06-04 DIAGNOSIS — R072 Precordial pain: Secondary | ICD-10-CM

## 2015-06-04 MED ORDER — AMLODIPINE BESYLATE 2.5 MG PO TABS: 2.5000 mg | ORAL_TABLET | Freq: Every day | ORAL | Status: DC

## 2015-06-04 NOTE — Patient Instructions (Signed)
Dr Percival Spanish has recommended making the following medication changes: START Amlodipine (Norvasc) 2.5 mg - take 1 tablet by mouth daily  >>A new prescription has been sent to your pharmacy electronically  Dr Percival Spanish recommends that you schedule a follow-up appointment in 1 month.  If you need a refill on your cardiac medications before your next appointment, please call your pharmacy.

## 2015-06-05 LAB — VITAMIN D 1,25 DIHYDROXY
VITAMIN D3 1, 25 (OH): 45 pg/mL
Vitamin D 1, 25 (OH)2 Total: 45 pg/mL (ref 18–72)

## 2015-06-09 DIAGNOSIS — L570 Actinic keratosis: Secondary | ICD-10-CM | POA: Diagnosis not present

## 2015-06-09 DIAGNOSIS — Z08 Encounter for follow-up examination after completed treatment for malignant neoplasm: Secondary | ICD-10-CM | POA: Diagnosis not present

## 2015-06-09 DIAGNOSIS — Z85828 Personal history of other malignant neoplasm of skin: Secondary | ICD-10-CM | POA: Diagnosis not present

## 2015-06-14 ENCOUNTER — Ambulatory Visit (INDEPENDENT_AMBULATORY_CARE_PROVIDER_SITE_OTHER): Payer: Medicare Other | Admitting: Internal Medicine

## 2015-06-14 ENCOUNTER — Encounter: Payer: Self-pay | Admitting: Internal Medicine

## 2015-06-14 VITALS — BP 170/80 | HR 56 | Temp 97.7°F | Resp 16 | Wt 149.0 lb

## 2015-06-14 DIAGNOSIS — R2689 Other abnormalities of gait and mobility: Secondary | ICD-10-CM

## 2015-06-14 DIAGNOSIS — I10 Essential (primary) hypertension: Secondary | ICD-10-CM

## 2015-06-14 MED ORDER — AMLODIPINE BESYLATE 2.5 MG PO TABS: 2.5000 mg | ORAL_TABLET | Freq: Two times a day (BID) | ORAL | Status: DC

## 2015-06-14 NOTE — Assessment & Plan Note (Signed)
Blood pressure elevated in the mornings and better controlled throughout the day He is able to take his medication and has not needed to hold any doses like he was before Continue current medications, add 2.5 mg amlodipine to his nighttime medication Continue to monitor medication 4 times a day Call with any questions or concerns He has a follow-up with cardiology in 2 weeks

## 2015-06-14 NOTE — Progress Notes (Signed)
Pre visit review using our clinic review tool, if applicable. No additional management support is needed unless otherwise documented below in the visit note. 

## 2015-06-14 NOTE — Progress Notes (Signed)
Subjective:    Patient ID: [name]., male    DOB: [birth year], 82 y.o.   MRN: [id number]  HPI He is here for follow up of his hypertension.  He saw cardiology two weeks ago and he started on norvasc 2.5 mg daily. He is taking his medication daily and has been monitoring his blood pressure 4 times a day at home. His blood pressure has remained elevated in the morning, but has been much better controlled this afternoon and evening. It is less variable. He has been able to take his medication consistently and has not needed to hold any of his doses.  In the mornings he has gotten systolic readings of [id number], 171, 180, 176, 157, then it is better the rest of the day 133/72, 136/68, 149/79.    He denies any shortness of breath, chest pain, leg swelling, palpitations, headaches and lightheadedness. He does feel fatigued. He is sedentary. He feels since his hospitalization he has been more sedentary. His wife thinks he would benefit from physical therapy. He states he would be willing to do that. He is able to drive on his own and his wife goes to physical therapy and he would like to go to the same place. He has fallen because of this and balance.    Medications and allergies reviewed with patient and updated if appropriate.  Patient Active Problem List   Diagnosis Date Noted  . Cerebral embolism with cerebral infarction 05/18/2015  . Coronary artery disease involving native coronary artery of native heart   . Acute encephalopathy 05/17/2015  . Numbness and tingling in right hand 05/17/2015  . Hyponatremia 05/17/2015  . Pain in the chest   . Essential hypertension   . Intermittent confusion   . Cough 04/13/2015  . PAD (peripheral artery disease) ([place]) 02/22/2015  . Subclavian artery stenosis, left 12/23/2014  . NSTEMI (non-ST elevated myocardial infarction) ([place])   . Chest pain 11/25/2014  . Tachycardia 11/24/2014  . Hx of CABG 2008 08/31/2014  . CAD S/P S-PDA DES 2012 08/31/2014    . PVD (peripheral vascular disease) ([place]) 08/31/2014  . Murmur 08/31/2014  . Anxiety about health 08/31/2014  . Edema 07/27/2014  . Hypertensive urgency 07/20/2014  . Accelerated hypertension 04/22/2014  . Abnormal chest x-ray 03/26/2014  . Abnormal CT scan, esophagus 01/07/2014  . Generalized anxiety disorder   . Anemia, iron deficiency   . PAF (paroxysmal atrial fibrillation) ([place]) 03/08/2013  . Chronic diastolic heart failure ([place]) 10/30/2012  . Osteoporosis/severe osteopenia 06/12/2012  . Bruit of left carotid artery 09/27/2011  . GERD 11/19/2009  . Hyperlipidemia with target LDL less than 100 10/04/2009  . ESOPHAGEAL STRICTURE 12/03/2007  . Coronary atherosclerosis 05/10/2007  . Seasonal and perennial allergic rhinitis 05/10/2007  . Asthma with COPD ([place]) 05/10/2007  . Obstructive sleep apnea 05/10/2007    Current Outpatient Prescriptions on File Prior to Visit  Medication Sig Dispense Refill  . acetaminophen (TYLENOL) 325 MG tablet Take 650 mg by mouth every 6 (six) hours as needed for moderate pain or headache.     . albuterol (PROVENTIL HFA;VENTOLIN HFA) 108 (90 BASE) MCG/ACT inhaler Inhale 2 puffs into the lungs every 6 (six) hours as needed for wheezing or shortness of breath. 8 g 5  . amLODipine (NORVASC) 2.5 MG tablet Take 1 tablet (2.5 mg total) by mouth daily. 30 tablet 11  . apixaban (ELIQUIS) 5 MG TABS tablet Take 1 tablet (5 mg total) by mouth 2 (two) times daily.  60 tablet 11  . budesonide (PULMICORT) 0.25 MG/2ML nebulizer solution Take 0.25 mg by nebulization 4 (four) times daily as needed (for SOB). For asthma    . calcium-vitamin D (OSCAL WITH D) 500-200 MG-UNIT per tablet Take 1 tablet by mouth 2 (two) times daily.    . cholestyramine (QUESTRAN) 4 g packet MIX AND DRINK 1 PACKET BY MOUTH TWICE A DAY IF NEEDED FOR LOOSE STOOLS 60 packet 0  . cloNIDine (CATAPRES) 0.3 MG tablet take 1 tablet by mouth twice a day 60 tablet 6  . clopidogrel (PLAVIX) 75 MG tablet  Take 1 tablet (75 mg total) by mouth daily with breakfast. 30 tablet 03  . DULERA 100-5 MCG/ACT AERO inhale 2 puffs by mouth once daily (THEN RINSE MOUTH AFTER USE) (Patient taking differently: inhale 2 puffs by mouth in morning (THEN RINSE MOUTH AFTER USE)) 17.6 g 5  . ipratropium-albuterol (DUONEB) 0.5-2.5 (3) MG/3ML SOLN USE ONE VIAL VIA NEBULIZER EVERY 6 HOURS AS NEEDED FOR SHORTNESS OF BREATH 360 mL 5  . isosorbide mononitrate (IMDUR) 60 MG 24 hr tablet Take 1 tablet (60 mg total) by mouth 2 (two) times daily. 180 tablet 0  . latanoprost (XALATAN) 0.005 % ophthalmic solution Place 1 drop into both eyes at bedtime.     Marland Kitchen LORazepam (ATIVAN) 0.5 MG tablet take 1 tablet by mouth three times a day 90 tablet 0  . metoprolol tartrate (LOPRESSOR) 100 MG tablet Take 1 tablet (100 mg total) by mouth 2 (two) times daily. 60 tablet 11  . montelukast (SINGULAIR) 10 MG tablet Take 1 tablet (10 mg total) by mouth at bedtime. 90 tablet 3  . nitroGLYCERIN (NITROSTAT) 0.4 MG SL tablet Place 1 tablet (0.4 mg total) under the tongue every 5 (five) minutes as needed for chest pain. 25 tablet 0  . NONFORMULARY OR COMPOUNDED ITEM Allergy Vaccine 1:10 Given at Home    . pantoprazole (PROTONIX) 40 MG tablet take 1 tablet by mouth twice a day (Patient taking differently: take 40 mg by mouth twice a day) 60 tablet 11  . pravastatin (PRAVACHOL) 80 MG tablet take 1 tablet by mouth every morning 30 tablet 2  . Probiotic Product (PROBIOTIC DAILY) CAPS Take 1 capsule by mouth at bedtime.    Marland Kitchen RA VITAMIN B-12 TR 1000 MCG TBCR take 1 tablet by mouth once daily (Patient taking differently: take 1000 mcg by mouth once daily) 30 tablet 11  . torsemide (DEMADEX) 20 MG tablet Take 20 mg by mouth daily.    . valsartan (DIOVAN) 320 MG tablet Take 1 tablet (320 mg total) by mouth daily. (Patient taking differently: Take 320 mg by mouth every morning. ) 90 tablet 0   No current facility-administered medications on file prior to visit.     Past Medical History  Diagnosis Date  . CORONARY HEART DISEASE     a. s/p CABG;  b. cath 06/27/10: S-Dx occluded, S-PDA 80-90% (tx with PCI); S-OM ok, L-LAD ok;  EF 65-70%  c.  s/p Promus DES to S-PDA 06/2010;   d. Cath 8 2016 LIMA to the LAD patent, SVG to posterior lateral subtotal, SVG to diagonal occluded chronically. The patient had stenting of the native right vessel.  Marland Kitchen FIBRILLATION, ATRIAL   . SLEEP APNEA 09/2001    NPSG AHI 22/HR  . ASTHMA   . Depression   . GERD     with HH, hx esophageal stricture  . DYSLIPIDEMIA   . HYPERTENSION     Echo 3/12:  EF 55-60%; mod LVH; mild AS/AI; LAE; PASP 38; mild pulmo HTN  . Personal history of alcoholism ([place])   . Diverticulosis   . Benign liver cyst   . Skin cancer     L forearm  . Cataract     surgery to both eyes  . Esophageal stricture   . Hiatal hernia   . Pneumonia 04/2013 hosp  . Anxiety   . Arthritis   . CHF (congestive heart failure) ([place])   . COPD (chronic obstructive pulmonary disease) ([place])   . Glaucoma   . Osteoporosis   . Subclavian arterial stenosis [facility])     Past Surgical History  Procedure Laterality Date  . Coronary artery bypass graft  02/2007  . Tonsillectomy    . Coronary angioplasty with stent placement  07/2010  . Cataract extraction, bilateral  2007  . Hernia repair  unsure ?60's  . Cholecystectomy  02/21/2011    Procedure: LAPAROSCOPIC CHOLECYSTECTOMY WITH INTRAOPERATIVE CHOLANGIOGRAM;  Surgeon: Pedro Earls, MD;  Location: [facility];  Service: General;  Laterality: N/A;  . Cardiac catheterization N/A 11/26/2014    Procedure: Left Heart Cath and Cors/Grafts Angiography;  Surgeon: Sherren Mocha, MD;  Location: [place] [facility];  Service: Cardiovascular;  Laterality: N/A;  . Cardiac catheterization N/A 11/26/2014    Procedure: Coronary Stent Intervention;  Surgeon: Sherren Mocha, MD;  Location: [place] [facility];  Service: Cardiovascular;  Laterality: N/A;  . Peripheral vascular catheterization N/A  02/22/2015    Procedure: Upper Extremity Angiography;  Surgeon: Lorretta Harp, MD;  Location: [place] [facility];  Service: Cardiovascular;  Laterality: N/A;    Social History   Social History  . Marital Status: Married    Spouse Name: N/A  . Number of Children: 2  . Years of Education: N/A   Occupational History  . retired     23 years   Social History Main Topics  . Smoking status: Former Smoker -- 3.00 packs/day for 40 years    Types: Cigarettes    Quit date: 04/11/1979  . Smokeless tobacco: Never Used  . Alcohol Use: No     Comment: quit drinking 40+ years  . Drug Use: No  . Sexual Activity: Yes   Other Topics Concern  . 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 Married lives with wife 2 children    Family History  Problem Relation Age of Onset  . COPD Sister   . Asthma Sister   . Emphysema Sister   . Hypertension Sister   . Hyperlipidemia Sister   . Hypertension Mother   . Heart disease Mother   . Hyperlipidemia Mother   . Colon cancer Neg Hx   . Heart disease Brother   . Heart disease Father   . Hyperlipidemia Father   . Hypertension Father     Review of Systems  Constitutional: Positive for fatigue.  Respiratory: Negative for shortness of breath.   Cardiovascular: Negative for chest pain, palpitations and leg swelling.  Neurological: Negative for dizziness, light-headedness and headaches.       Poor balance       Objective:   Filed Vitals:   06/14/15 1113  BP: 170/80  Pulse: 56  Temp: 97.7 F (36.5 C)  Resp: 16   Filed Weights   06/14/15 1113  Weight: 149 lb (67.586 kg)   Body mass index is 27.25 kg/(m^2).   Physical Exam Constitutional: Appears well-developed and well-nourished. No distress.  Neck: Neck supple. No tracheal deviation present. No thyromegaly present.  carotid bruit. No cervical adenopathy.   Cardiovascular: Normal rate, regular rhythm and normal heart sounds.   3/6 systolic  murmur.  No edema Pulmonary/Chest: Effort normal and breath sounds normal. No respiratory distress. No wheezes.       Assessment & Plan:    Physical therapy referral placed for poor balance, high risk of falls  See Problem List for Assessment and Plan of chronic medical problems.  Follow-up in May already scheduled. Follow up sooner if needed

## 2015-06-14 NOTE — Patient Instructions (Addendum)
Start amlodipine 2.5 mg twice daily.   Continue all of your current medications.  A referral was ordered for physical therapy.

## 2015-06-15 ENCOUNTER — Telehealth: Payer: Self-pay | Admitting: Internal Medicine

## 2015-06-15 NOTE — Telephone Encounter (Signed)
Referral ordered yesterday. 

## 2015-06-15 NOTE — Telephone Encounter (Signed)
Patient's wife came in to advise that she went to the hand specialist as mentioned in the previous visit. They advised her that they would need a "prescription for balance therapy"

## 2015-06-16 ENCOUNTER — Telehealth: Payer: Self-pay | Admitting: Internal Medicine

## 2015-06-16 DIAGNOSIS — R000 Tachycardia, unspecified: Secondary | ICD-10-CM | POA: Diagnosis not present

## 2015-06-16 NOTE — Telephone Encounter (Signed)
Spoke with pt to inform.  

## 2015-06-16 NOTE — Telephone Encounter (Signed)
[place] [facility] - Client [place] Call Center  Patient Name: [name]  DOB: March 18, 1931    Initial Comment caller states husband's BP is 200/103 and HR 104, flushed in face and anxious feeling   Nurse Assessment  Nurse: Wayne Sever, RN, Tillie Rung Date/Time (Eastern Time): 06/16/2015 2:58:29 PM  Confirm and document reason for call. If symptomatic, describe symptoms. You must click the next button to save text entered. ---Wife states his BP 200/103 and heart rate is 104. He is flushed and anxious.  Has the patient traveled out of the country within the last 30 days? ---Not Applicable  Does the patient have any new or worsening symptoms? ---Yes  Will a triage be completed? ---Yes  Related visit to physician within the last 2 weeks? ---No  Does the PT have any chronic conditions? (i.e. diabetes, asthma, etc.) ---Yes  List chronic conditions. ---Asthma, HTN, Heart Issues  Is this a behavioral health or substance abuse call? ---No     Guidelines    Guideline Title Affirmed Question Affirmed Notes  High Blood Pressure BP ? 180/110    Final Disposition User   See Physician within 24 Hours [place], RN, Tillie Rung    Comments  No appointments today or tomorrow at office. They will go to [facility] today   Referrals  REFERRED TO PCP OFFICE  GO TO FACILITY OTHER - SPECIFY   Disagree/Comply: Comply

## 2015-06-22 DIAGNOSIS — R2681 Unsteadiness on feet: Secondary | ICD-10-CM | POA: Diagnosis not present

## 2015-06-22 DIAGNOSIS — M6281 Muscle weakness (generalized): Secondary | ICD-10-CM | POA: Diagnosis not present

## 2015-06-22 DIAGNOSIS — I639 Cerebral infarction, unspecified: Secondary | ICD-10-CM | POA: Diagnosis not present

## 2015-06-22 DIAGNOSIS — R262 Difficulty in walking, not elsewhere classified: Secondary | ICD-10-CM | POA: Diagnosis not present

## 2015-06-23 ENCOUNTER — Telehealth: Payer: Self-pay | Admitting: Internal Medicine

## 2015-06-23 MED ORDER — AMLODIPINE BESYLATE 2.5 MG PO TABS: 2.5000 mg | ORAL_TABLET | Freq: Two times a day (BID) | ORAL | Status: DC

## 2015-06-23 NOTE — Telephone Encounter (Signed)
Yes 2.5 mg twice daily.  rx resent to [facility].

## 2015-06-23 NOTE — Telephone Encounter (Signed)
Please advise on directions? 

## 2015-06-23 NOTE — Telephone Encounter (Signed)
Pt's wife called stating [facility] on [address]. Doesn't have pts prescription for amLODipine (NORVASC) 2.5 MG tablet [id number] She also wants to be sure that he should be taking 1 tablet 2 times a day that equals a total of 5 mg a day.

## 2015-06-24 DIAGNOSIS — R2681 Unsteadiness on feet: Secondary | ICD-10-CM | POA: Diagnosis not present

## 2015-06-24 DIAGNOSIS — M6281 Muscle weakness (generalized): Secondary | ICD-10-CM | POA: Diagnosis not present

## 2015-06-24 DIAGNOSIS — R262 Difficulty in walking, not elsewhere classified: Secondary | ICD-10-CM | POA: Diagnosis not present

## 2015-06-24 DIAGNOSIS — I639 Cerebral infarction, unspecified: Secondary | ICD-10-CM | POA: Diagnosis not present

## 2015-06-24 NOTE — Progress Notes (Signed)
HPI The patient presents for follow up of  CAD, s/p CABG, s/p Promus DES to S-PDA in 06/2010.  He was hospitalized last year with hypertensive urgency and NSTEMI.  He had subtotal stenosis of the vein graft to posterior lateral off the right coronary. He had stenting of the native right coronary.  The patient also was found to have subclavian stenosis.   He was later admitted for PCI of the subclavian.  He did have medication changes to include the addition of spironolactone and increased metoprolol.  He had an ileus postprocedure as well.  He was most recently with chest pain. Enzymes were negative. There was a question of him having a CVA that he was seen by neurology and it was not clear that it had an acute event. He did have mild hyponatremia. At the last visit I restarted Norvasc. He's been doing well. He did have one day where his heart rate was elevated and he is tachycardic. He actually called EMS. He said his heart rate was in the 120s. They did not suggest that he needed to be transported to the hospital. Slowly came down. Otherwise his blood pressures have been excellent and he brings a blood pressure diary today. He has no acute complaints. He's not having any new shortness of breath, PND or orthopnea. He's had no new palpitations, presyncope or syncope.  Allergies  Allergen Reactions  . Ace Inhibitors Other (See Comments)    Severe asthma (COPD)  . Avelox [Moxifloxacin Hcl In Nacl] Other (See Comments)    Gum pain  . Tape Other (See Comments)    Adhesive and tape reaction-tears skin off   . Lidocaine Other (See Comments)    Possible reaction? Dizziness, confusion, syncope  . Morphine And Related Nausea And Vomiting  . Codeine Nausea And Vomiting  . Lasix [Furosemide] Itching and Swelling  . Other Other (See Comments)    Maple trees, allergy symptoms    Current Outpatient Prescriptions  Medication Sig Dispense Refill  . acetaminophen (TYLENOL) 325 MG tablet Take 650 mg by mouth  every 6 (six) hours as needed for moderate pain or headache.     . albuterol (PROVENTIL HFA;VENTOLIN HFA) 108 (90 BASE) MCG/ACT inhaler Inhale 2 puffs into the lungs every 6 (six) hours as needed for wheezing or shortness of breath. 8 g 5  . amLODipine (NORVASC) 2.5 MG tablet Take 1 tablet (2.5 mg total) by mouth 2 (two) times daily. 60 tablet 11  . apixaban (ELIQUIS) 5 MG TABS tablet Take 1 tablet (5 mg total) by mouth 2 (two) times daily. 60 tablet 11  . budesonide (PULMICORT) 0.25 MG/2ML nebulizer solution Take 0.25 mg by nebulization 4 (four) times daily as needed (for SOB). For asthma    . calcium-vitamin D (OSCAL WITH D) 500-200 MG-UNIT per tablet Take 1 tablet by mouth 2 (two) times daily.    . cholestyramine (QUESTRAN) 4 g packet MIX AND DRINK 1 PACKET BY MOUTH TWICE A DAY IF NEEDED FOR LOOSE STOOLS 60 packet 0  . cloNIDine (CATAPRES) 0.3 MG tablet take 1 tablet by mouth twice a day 60 tablet 6  . clopidogrel (PLAVIX) 75 MG tablet Take 1 tablet (75 mg total) by mouth daily with breakfast. 30 tablet 03  . DULERA 100-5 MCG/ACT AERO inhale 2 puffs by mouth once daily (THEN RINSE MOUTH AFTER USE) (Patient taking differently: inhale 2 puffs by mouth in morning (THEN RINSE MOUTH AFTER USE)) 17.6 g 5  . ipratropium-albuterol (DUONEB) 0.5-2.5 (3)  MG/3ML SOLN USE ONE VIAL VIA NEBULIZER EVERY 6 HOURS AS NEEDED FOR SHORTNESS OF BREATH 360 mL 5  . isosorbide mononitrate (IMDUR) 60 MG 24 hr tablet Take 1 tablet (60 mg total) by mouth 2 (two) times daily. 180 tablet 0  . latanoprost (XALATAN) 0.005 % ophthalmic solution Place 1 drop into both eyes at bedtime.     Marland Kitchen LORazepam (ATIVAN) 0.5 MG tablet take 1 tablet by mouth three times a day 90 tablet 0  . metoprolol tartrate (LOPRESSOR) 100 MG tablet Take 1 tablet (100 mg total) by mouth 2 (two) times daily. 60 tablet 11  . montelukast (SINGULAIR) 10 MG tablet Take 1 tablet (10 mg total) by mouth at bedtime. 90 tablet 3  . nitroGLYCERIN (NITROSTAT) 0.4 MG SL  tablet Place 1 tablet (0.4 mg total) under the tongue every 5 (five) minutes as needed for chest pain. 25 tablet 0  . NONFORMULARY OR COMPOUNDED ITEM Allergy Vaccine 1:10 Given at Home    . pantoprazole (PROTONIX) 40 MG tablet take 1 tablet by mouth twice a day (Patient taking differently: take 40 mg by mouth twice a day) 60 tablet 11  . pravastatin (PRAVACHOL) 80 MG tablet take 1 tablet by mouth every morning 30 tablet 2  . Probiotic Product (PROBIOTIC DAILY) CAPS Take 1 capsule by mouth at bedtime.    Marland Kitchen RA VITAMIN B-12 TR 1000 MCG TBCR take 1 tablet by mouth once daily (Patient taking differently: take 1000 mcg by mouth once daily) 30 tablet 11  . torsemide (DEMADEX) 20 MG tablet Take 20 mg by mouth daily.    . valsartan (DIOVAN) 320 MG tablet Take 1 tablet (320 mg total) by mouth daily. (Patient taking differently: Take 320 mg by mouth every morning. ) 90 tablet 0   No current facility-administered medications for this visit.    Past Medical History  Diagnosis Date  . CORONARY HEART DISEASE     a. s/p CABG;  b. cath 06/27/10: S-Dx occluded, S-PDA 80-90% (tx with PCI); S-OM ok, L-LAD ok;  EF 65-70%  c.  s/p Promus DES to S-PDA 06/2010;   d. Cath 8 2016 LIMA to the LAD patent, SVG to posterior lateral subtotal, SVG to diagonal occluded chronically. The patient had stenting of the native right vessel.  Marland Kitchen FIBRILLATION, ATRIAL   . SLEEP APNEA 09/2001    NPSG AHI 22/HR  . ASTHMA   . Depression   . GERD     with HH, hx esophageal stricture  . DYSLIPIDEMIA   . HYPERTENSION     Echo 3/12: EF 55-60%; mod LVH; mild AS/AI; LAE; PASP 38; mild pulmo HTN  . Personal history of alcoholism ([place])   . Diverticulosis   . Benign liver cyst   . Skin cancer     L forearm  . Cataract     surgery to both eyes  . Esophageal stricture   . Hiatal hernia   . Pneumonia 04/2013 hosp  . Anxiety   . Arthritis   . CHF (congestive heart failure) ([place])   . COPD (chronic obstructive pulmonary disease) ([place])   .  Glaucoma   . Osteoporosis   . Subclavian arterial stenosis [facility])     Past Surgical History  Procedure Laterality Date  . Coronary artery bypass graft  02/2007  . Tonsillectomy    . Coronary angioplasty with stent placement  07/2010  . Cataract extraction, bilateral  2007  . Hernia repair  unsure ?60's  . Cholecystectomy  02/21/2011  Procedure: LAPAROSCOPIC CHOLECYSTECTOMY WITH INTRAOPERATIVE CHOLANGIOGRAM;  Surgeon: Pedro Earls, MD;  [facility]: [facility];  Service: General;  Laterality: N/A;  . Cardiac catheterization N/A 11/26/2014    Procedure: Left Heart Cath and Cors/Grafts Angiography;  Surgeon: Sherren Mocha, MD;  Location: [place] [facility];  Service: Cardiovascular;  Laterality: N/A;  . Cardiac catheterization N/A 11/26/2014    Procedure: Coronary Stent Intervention;  Surgeon: Sherren Mocha, MD;  Location: [place] [facility];  Service: Cardiovascular;  Laterality: N/A;  . Peripheral vascular catheterization N/A 02/22/2015    Procedure: Upper Extremity Angiography;  Surgeon: Lorretta Harp, MD;  Location: [place] [facility];  Service: Cardiovascular;  Laterality: N/A;    ROS:  As stated in the HPI and negative for all other systems.  PHYSICAL EXAM BP 122/54 mmHg  Pulse 67  Ht 5\' 2"  (1.575 m)  Wt 150 lb (68.04 kg)  BMI 27.43 kg/m2 GENERAL:  Chronically ill appearing but in no acute distress HEENT:  Pupils equal round and reactive, fundi not visualized, oral mucosa unremarkable, dentures NECK:  No jugular venous distention, waveform within normal limits, carotid upstroke brisk and symmetric, left carotid bruits, no thyromegaly LUNGS:  Clear to auscultation bilaterally CHEST:  Well healed sternotomy scar. HEART:  PMI not displaced or sustained,S1 and S2 within normal limits, no S3, no S4, no clicks, no rubs, soft apical, right upper sterna border early peaking murmur ABD:  Flat, positive bowel sounds normal in frequency in pitch, no bruits, no rebound, no guarding, no  midline pulsatile mass, no hepatomegaly, no splenomegaly, healed abdominal scars. EXT:  2 plus pulses throughout, mild bilateral edema, no cyanosis no clubbing, right bruit.   SKIN:  No nodules, bruising  ASSESSMENT AND PLAN  CAD:  The patient has no new sypmtoms.  There was no objective evidence of ischemia when he was hospitalized recently. No change in therapy is indicated. He is now on Plavix as well as Eliquis.  He will remain on dual therapy until this summer at the one-year anniversary of his last intervention.  ATRIAL FIBRILLATION:    He has no Paroxysmal symptoms.  He will continue with Eliqus as above  HTN: His blood pressure seems to be better controlled. He will continue the meds as listed.  We talked about when necessary dosing of his metoprolol should he have any hypertensive urgencies such as he had on 3/7.  CAROTID STENOSIS:  I reviewed the results from last year he had 40-59% bilateral stenosis.  He will have follow-up in September.  AS:  This was moderate on echo earlier this year. We will follow this clinically.

## 2015-06-24 NOTE — Telephone Encounter (Signed)
Spoke with pt to inform.  

## 2015-06-25 ENCOUNTER — Ambulatory Visit (INDEPENDENT_AMBULATORY_CARE_PROVIDER_SITE_OTHER): Payer: Medicare Other | Admitting: Cardiology

## 2015-06-25 ENCOUNTER — Encounter: Payer: Self-pay | Admitting: Cardiology

## 2015-06-25 VITALS — BP 122/54 | HR 67 | Ht 62.0 in | Wt 150.0 lb

## 2015-06-25 DIAGNOSIS — I25700 Atherosclerosis of coronary artery bypass graft(s), unspecified, with unstable angina pectoris: Secondary | ICD-10-CM

## 2015-06-25 NOTE — Patient Instructions (Signed)
Your physician recommends that you schedule a follow-up appointment in: 2 Months  

## 2015-06-29 DIAGNOSIS — I639 Cerebral infarction, unspecified: Secondary | ICD-10-CM | POA: Diagnosis not present

## 2015-06-29 DIAGNOSIS — M6281 Muscle weakness (generalized): Secondary | ICD-10-CM | POA: Diagnosis not present

## 2015-06-29 DIAGNOSIS — R2681 Unsteadiness on feet: Secondary | ICD-10-CM | POA: Diagnosis not present

## 2015-06-29 DIAGNOSIS — R262 Difficulty in walking, not elsewhere classified: Secondary | ICD-10-CM | POA: Diagnosis not present

## 2015-07-02 DIAGNOSIS — M6281 Muscle weakness (generalized): Secondary | ICD-10-CM | POA: Diagnosis not present

## 2015-07-02 DIAGNOSIS — R2681 Unsteadiness on feet: Secondary | ICD-10-CM | POA: Diagnosis not present

## 2015-07-02 DIAGNOSIS — I639 Cerebral infarction, unspecified: Secondary | ICD-10-CM | POA: Diagnosis not present

## 2015-07-02 DIAGNOSIS — R262 Difficulty in walking, not elsewhere classified: Secondary | ICD-10-CM | POA: Diagnosis not present

## 2015-07-06 DIAGNOSIS — R2681 Unsteadiness on feet: Secondary | ICD-10-CM | POA: Diagnosis not present

## 2015-07-06 DIAGNOSIS — M6281 Muscle weakness (generalized): Secondary | ICD-10-CM | POA: Diagnosis not present

## 2015-07-06 DIAGNOSIS — I639 Cerebral infarction, unspecified: Secondary | ICD-10-CM | POA: Diagnosis not present

## 2015-07-06 DIAGNOSIS — R262 Difficulty in walking, not elsewhere classified: Secondary | ICD-10-CM | POA: Diagnosis not present

## 2015-07-09 DIAGNOSIS — M6281 Muscle weakness (generalized): Secondary | ICD-10-CM | POA: Diagnosis not present

## 2015-07-09 DIAGNOSIS — I639 Cerebral infarction, unspecified: Secondary | ICD-10-CM | POA: Diagnosis not present

## 2015-07-09 DIAGNOSIS — R262 Difficulty in walking, not elsewhere classified: Secondary | ICD-10-CM | POA: Diagnosis not present

## 2015-07-09 DIAGNOSIS — R2681 Unsteadiness on feet: Secondary | ICD-10-CM | POA: Diagnosis not present

## 2015-07-12 ENCOUNTER — Other Ambulatory Visit: Payer: Self-pay | Admitting: Internal Medicine

## 2015-07-13 DIAGNOSIS — I639 Cerebral infarction, unspecified: Secondary | ICD-10-CM | POA: Diagnosis not present

## 2015-07-13 DIAGNOSIS — M6281 Muscle weakness (generalized): Secondary | ICD-10-CM | POA: Diagnosis not present

## 2015-07-13 DIAGNOSIS — R262 Difficulty in walking, not elsewhere classified: Secondary | ICD-10-CM | POA: Diagnosis not present

## 2015-07-13 DIAGNOSIS — R2681 Unsteadiness on feet: Secondary | ICD-10-CM | POA: Diagnosis not present

## 2015-07-13 MED ORDER — CHOLESTYRAMINE 4 G PO PACK: PACK | ORAL | Status: DC

## 2015-07-13 NOTE — Telephone Encounter (Signed)
Med sent to pharmacy.

## 2015-07-14 ENCOUNTER — Telehealth: Payer: Self-pay | Admitting: Internal Medicine

## 2015-07-14 MED ORDER — LORAZEPAM 0.5 MG PO TABS: 0.5000 mg | ORAL_TABLET | Freq: Three times a day (TID) | ORAL | Status: DC

## 2015-07-14 NOTE — Telephone Encounter (Signed)
Faxed to [facility] 

## 2015-07-14 NOTE — Telephone Encounter (Signed)
Patients wife is calling to request refill of LORazepam (ATIVAN) 0.5 MG tablet [id number]  To [facility] [address]

## 2015-07-16 DIAGNOSIS — I639 Cerebral infarction, unspecified: Secondary | ICD-10-CM | POA: Diagnosis not present

## 2015-07-16 DIAGNOSIS — M6281 Muscle weakness (generalized): Secondary | ICD-10-CM | POA: Diagnosis not present

## 2015-07-16 DIAGNOSIS — R262 Difficulty in walking, not elsewhere classified: Secondary | ICD-10-CM | POA: Diagnosis not present

## 2015-07-16 DIAGNOSIS — R2681 Unsteadiness on feet: Secondary | ICD-10-CM | POA: Diagnosis not present

## 2015-07-18 ENCOUNTER — Ambulatory Visit (INDEPENDENT_AMBULATORY_CARE_PROVIDER_SITE_OTHER): Payer: Medicare Other | Admitting: Physician Assistant

## 2015-07-18 ENCOUNTER — Encounter: Payer: Self-pay | Admitting: Family Medicine

## 2015-07-18 VITALS — BP 160/80 | HR 86 | Temp 97.4°F | Resp 24 | Ht 60.0 in | Wt 147.0 lb

## 2015-07-18 DIAGNOSIS — Z8709 Personal history of other diseases of the respiratory system: Secondary | ICD-10-CM

## 2015-07-18 DIAGNOSIS — R062 Wheezing: Secondary | ICD-10-CM | POA: Diagnosis not present

## 2015-07-18 DIAGNOSIS — J441 Chronic obstructive pulmonary disease with (acute) exacerbation: Secondary | ICD-10-CM | POA: Diagnosis not present

## 2015-07-18 LAB — POCT CBC
GRANULOCYTE PERCENT: 65.9 % (ref 37–80)
HCT, POC: 34.2 % — AB (ref 43.5–53.7)
Hemoglobin: 12.3 g/dL — AB (ref 14.1–18.1)
Lymph, poc: 1.7 (ref 0.6–3.4)
MCH, POC: 31.6 pg — AB (ref 27–31.2)
MCHC: 36 g/dL — AB (ref 31.8–35.4)
MCV: 87.8 fL (ref 80–97)
MID (cbc): 0.5 (ref 0–0.9)
MPV: 6.5 fL (ref 0–99.8)
PLATELET COUNT, POC: 227 10*3/uL (ref 142–424)
POC Granulocyte: 4.3 (ref 2–6.9)
POC LYMPH %: 26.6 % (ref 10–50)
POC MID %: 7.5 %M (ref 0–12)
RBC: 3.89 M/uL — AB (ref 4.69–6.13)
RDW, POC: 14.4 %
WBC: 6.5 10*3/uL (ref 4.6–10.2)

## 2015-07-18 MED ORDER — ALBUTEROL SULFATE (2.5 MG/3ML) 0.083% IN NEBU
2.5000 mg | INHALATION_SOLUTION | Freq: Once | RESPIRATORY_TRACT | Status: AC
  Administered 2015-07-18: 2.5 mg via RESPIRATORY_TRACT

## 2015-07-18 MED ORDER — PREDNISONE 20 MG PO TABS: ORAL_TABLET | ORAL | Status: AC

## 2015-07-18 MED ORDER — IPRATROPIUM BROMIDE 0.02 % IN SOLN
0.5000 mg | Freq: Once | RESPIRATORY_TRACT | Status: AC
  Administered 2015-07-18: 0.5 mg via RESPIRATORY_TRACT

## 2015-07-18 MED ORDER — METHYLPREDNISOLONE SODIUM SUCC 125 MG IJ SOLR
125.0000 mg | Freq: Once | INTRAMUSCULAR | Status: AC
  Administered 2015-07-18: 125 mg via INTRAMUSCULAR

## 2015-07-18 MED ORDER — IPRATROPIUM BROMIDE 0.02 % IN SOLN
0.5000 mg | Freq: Three times a day (TID) | RESPIRATORY_TRACT | Status: DC
Start: 2015-07-18 — End: 2015-10-21

## 2015-07-18 MED ORDER — DOXYCYCLINE HYCLATE 100 MG PO CAPS: 100.0000 mg | ORAL_CAPSULE | Freq: Two times a day (BID) | ORAL | Status: AC

## 2015-07-18 NOTE — Progress Notes (Deleted)
Subjective:    Patient ID: [name]., male    DOB: 27-Jul-1930, 82 y.o.   MRN: [id number]  07/18/2015  Shortness of Breath   HPI  Review of Systems  Constitutional: Negative for fever, chills, diaphoresis, activity change, appetite change and fatigue.  Respiratory: Negative for cough and shortness of breath.   Cardiovascular: Negative for chest pain, palpitations and leg swelling.  Gastrointestinal: Negative for nausea, vomiting, abdominal pain and diarrhea.  Endocrine: Negative for cold intolerance, heat intolerance, polydipsia, polyphagia and polyuria.  Skin: Negative for color change, rash and wound.  Neurological: Negative for dizziness, tremors, seizures, syncope, facial asymmetry, speech difficulty, weakness, light-headedness, numbness and headaches.  Psychiatric/Behavioral: Negative for sleep disturbance and dysphoric mood. The patient is not nervous/anxious.     Past Medical History  Diagnosis Date  . CORONARY HEART DISEASE     a. s/p CABG;  b. cath 06/27/10: S-Dx occluded, S-PDA 80-90% (tx with PCI); S-OM ok, L-LAD ok;  EF 65-70%  c.  s/p Promus DES to S-PDA 06/2010;   d. Cath 8 2016 LIMA to the LAD patent, SVG to posterior lateral subtotal, SVG to diagonal occluded chronically. The patient had stenting of the native right vessel.  Marland Kitchen FIBRILLATION, ATRIAL   . SLEEP APNEA 09/2001    NPSG AHI 22/HR  . ASTHMA   . Depression   . GERD     with HH, hx esophageal stricture  . DYSLIPIDEMIA   . HYPERTENSION     Echo 3/12: EF 55-60%; mod LVH; mild AS/AI; LAE; PASP 38; mild pulmo HTN  . Personal history of alcoholism ([place])   . Diverticulosis   . Benign liver cyst   . Skin cancer     L forearm  . Cataract     surgery to both eyes  . Esophageal stricture   . Hiatal hernia   . Pneumonia 04/2013 hosp  . Anxiety   . Arthritis   . CHF (congestive heart failure) ([place])   . COPD (chronic obstructive pulmonary disease) ([place])   . Glaucoma   . Osteoporosis   . Subclavian  arterial stenosis [facility])    Past Surgical History  Procedure Laterality Date  . Coronary artery bypass graft  02/2007  . Tonsillectomy    . Coronary angioplasty with stent placement  07/2010  . Cataract extraction, bilateral  2007  . Hernia repair  unsure ?60's  . Cholecystectomy  02/21/2011    Procedure: LAPAROSCOPIC CHOLECYSTECTOMY WITH INTRAOPERATIVE CHOLANGIOGRAM;  Surgeon: Pedro Earls, MD;  Location: [facility];  Service: General;  Laterality: N/A;  . Cardiac catheterization N/A 11/26/2014    Procedure: Left Heart Cath and Cors/Grafts Angiography;  Surgeon: Sherren Mocha, MD;  Location: [place] [facility];  Service: Cardiovascular;  Laterality: N/A;  . Cardiac catheterization N/A 11/26/2014    Procedure: Coronary Stent Intervention;  Surgeon: Sherren Mocha, MD;  Location: [place] [facility];  Service: Cardiovascular;  Laterality: N/A;  . Peripheral vascular catheterization N/A 02/22/2015    Procedure: Upper Extremity Angiography;  Surgeon: Lorretta Harp, MD;  Location: [place] [facility];  Service: Cardiovascular;  Laterality: N/A;   Allergies  Allergen Reactions  . Ace Inhibitors Other (See Comments)    Severe asthma (COPD)  . Avelox [Moxifloxacin Hcl In Nacl] Other (See Comments)    Gum pain  . Tape Other (See Comments)    Adhesive and tape reaction-tears skin off   . Lidocaine Other (See Comments)    Possible reaction? Dizziness, confusion, syncope  .  Morphine And Related Nausea And Vomiting  . Codeine Nausea And Vomiting  . Lasix [Furosemide] Itching and Swelling  . Other Other (See Comments)    Maple trees, allergy symptoms   Current Outpatient Prescriptions  Medication Sig Dispense Refill  . acetaminophen (TYLENOL) 325 MG tablet Take 650 mg by mouth every 6 (six) hours as needed for moderate pain or headache.     . albuterol (PROVENTIL HFA;VENTOLIN HFA) 108 (90 BASE) MCG/ACT inhaler Inhale 2 puffs into the lungs every 6 (six) hours as needed for wheezing or shortness  of breath. 8 g 5  . amLODipine (NORVASC) 2.5 MG tablet Take 1 tablet (2.5 mg total) by mouth 2 (two) times daily. 60 tablet 11  . apixaban (ELIQUIS) 5 MG TABS tablet Take 1 tablet (5 mg total) by mouth 2 (two) times daily. 60 tablet 11  . budesonide (PULMICORT) 0.25 MG/2ML nebulizer solution Take 0.25 mg by nebulization 4 (four) times daily as needed (for SOB). For asthma    . calcium-vitamin D (OSCAL WITH D) 500-200 MG-UNIT per tablet Take 1 tablet by mouth 2 (two) times daily.    . cholestyramine (QUESTRAN) 4 g packet MIX AND DRINK 1 PACKET BY MOUTH TWICE A DAY IF NEEDED FOR LOOSE STOOLS 60 packet 2  . cloNIDine (CATAPRES) 0.3 MG tablet take 1 tablet by mouth twice a day 60 tablet 6  . clopidogrel (PLAVIX) 75 MG tablet Take 1 tablet (75 mg total) by mouth daily with breakfast. 30 tablet 03  . DULERA 100-5 MCG/ACT AERO inhale 2 puffs by mouth once daily (THEN RINSE MOUTH AFTER USE) (Patient taking differently: inhale 2 puffs by mouth in morning (THEN RINSE MOUTH AFTER USE)) 17.6 g 5  . ipratropium-albuterol (DUONEB) 0.5-2.5 (3) MG/3ML SOLN USE ONE VIAL VIA NEBULIZER EVERY 6 HOURS AS NEEDED FOR SHORTNESS OF BREATH 360 mL 5  . isosorbide mononitrate (IMDUR) 60 MG 24 hr tablet Take 1 tablet (60 mg total) by mouth 2 (two) times daily. 180 tablet 0  . latanoprost (XALATAN) 0.005 % ophthalmic solution Place 1 drop into both eyes at bedtime.     Marland Kitchen LORazepam (ATIVAN) 0.5 MG tablet Take 1 tablet (0.5 mg total) by mouth 3 (three) times daily. 90 tablet 0  . metoprolol tartrate (LOPRESSOR) 100 MG tablet Take 1 tablet (100 mg total) by mouth 2 (two) times daily. 60 tablet 11  . montelukast (SINGULAIR) 10 MG tablet Take 1 tablet (10 mg total) by mouth at bedtime. 90 tablet 3  . nitroGLYCERIN (NITROSTAT) 0.4 MG SL tablet Place 1 tablet (0.4 mg total) under the tongue every 5 (five) minutes as needed for chest pain. 25 tablet 0  . NONFORMULARY OR COMPOUNDED ITEM Allergy Vaccine 1:10 Given at Home    .  pantoprazole (PROTONIX) 40 MG tablet take 1 tablet by mouth twice a day (Patient taking differently: take 40 mg by mouth twice a day) 60 tablet 11  . pravastatin (PRAVACHOL) 80 MG tablet take 1 tablet by mouth every morning 30 tablet 2  . Probiotic Product (PROBIOTIC DAILY) CAPS Take 1 capsule by mouth at bedtime.    Marland Kitchen RA VITAMIN B-12 TR 1000 MCG TBCR take 1 tablet by mouth once daily (Patient taking differently: take 1000 mcg by mouth once daily) 30 tablet 11  . torsemide (DEMADEX) 20 MG tablet Take 20 mg by mouth daily.    . valsartan (DIOVAN) 320 MG tablet Take 1 tablet (320 mg total) by mouth daily. (Patient taking differently: Take 320 mg by  mouth every morning. ) 90 tablet 0   No current facility-administered medications for this visit.   Social History   Social History  . Marital Status: Married    Spouse Name: N/A  . Number of Children: 2  . Years of Education: N/A   Occupational History  . retired     69 years   Social History Main Topics  . Smoking status: Former Smoker -- 3.00 packs/day for 40 years    Types: Cigarettes    Quit date: 04/11/1979  . Smokeless tobacco: Never Used  . Alcohol Use: No     Comment: quit drinking 40+ years  . Drug Use: No  . Sexual Activity: Yes   Other Topics Concern  . 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 Married lives with wife 2 children   Family History  Problem Relation Age of Onset  . COPD Sister   . Asthma Sister   . Emphysema Sister   . Hypertension Sister   . Hyperlipidemia Sister   . Hypertension Mother   . Heart disease Mother   . Hyperlipidemia Mother   . Colon cancer Neg Hx   . Heart disease Brother   . Heart disease Father   . Hyperlipidemia Father   . Hypertension Father        Objective:    BP 160/80 mmHg  Pulse 86  Temp(Src) 97.4 F (36.3 C)  Resp 24  Ht 5' (1.524 m)  Wt 147 lb (66.679 kg)  BMI 28.71 kg/m2  SpO2 98% Physical Exam  Constitutional: He is  oriented to person, place, and time. He appears well-developed and well-nourished. No distress.  HENT:  Head: Normocephalic and atraumatic.  Right Ear: External ear normal.  Left Ear: External ear normal.  Nose: Nose normal.  Mouth/Throat: Oropharynx is clear and moist.  Eyes: Conjunctivae and EOM are normal. Pupils are equal, round, and reactive to light.  Neck: Normal range of motion. Neck supple. Carotid bruit is not present. No thyromegaly present.  Cardiovascular: Normal rate, regular rhythm, normal heart sounds and intact distal pulses.  Exam reveals no gallop and no friction rub.   No murmur heard. Pulmonary/Chest: Effort normal and breath sounds normal. He has no wheezes. He has no rales.  Abdominal: Soft. Bowel sounds are normal. He exhibits no distension and no mass. There is no tenderness. There is no rebound and no guarding.  Lymphadenopathy:    He has no cervical adenopathy.  Neurological: He is alert and oriented to person, place, and time. No cranial nerve deficit.  Skin: Skin is warm and dry. No rash noted. He is not diaphoretic.  Psychiatric: He has a normal mood and affect. His behavior is normal.  Nursing note and vitals reviewed.  Results for orders placed or performed in visit on 06/02/15  Lipid panel  Result Value Ref Range   Cholesterol 128 0 - 200 mg/dL   Triglycerides 95.0 0.0 - 149.0 mg/dL   HDL 37.00 (L) >39.00 mg/dL   VLDL 19.0 0.0 - 40.0 mg/dL   LDL Cholesterol 72 0 - 99 mg/dL   Total CHOL/HDL Ratio 3    NonHDL 90.69   TSH  Result Value Ref Range   TSH 1.77 0.35 - 4.50 uIU/mL  Vitamin D 1,25 dihydroxy  Result Value Ref Range   Vitamin D 1, 25 (OH)2 Total 45 18 - 72 pg/mL   Vitamin D3 1, 25 (OH)2 45 pg/mL   Vitamin D2 1,  25 (OH)2 <8 pg/mL  Ferritin  Result Value Ref Range   Ferritin 69.4 22.0 - 322.0 ng/mL  Iron  Result Value Ref Range   Iron 69 42 - 165 ug/dL  CBC with Differential/Platelet  Result Value Ref Range   WBC 9.4 4.0 - 10.5 K/uL    RBC 4.23 4.22 - 5.81 Mil/uL   Hemoglobin 13.1 13.0 - 17.0 g/dL   HCT 38.1 (L) 39.0 - 52.0 %   MCV 90.2 78.0 - 100.0 fl   MCHC 34.5 30.0 - 36.0 g/dL   RDW 14.6 11.5 - 15.5 %   Platelets 220.0 150.0 - 400.0 K/uL   Neutrophils Relative % 68.9 43.0 - 77.0 %   Lymphocytes Relative 21.8 12.0 - 46.0 %   Monocytes Relative 8.4 3.0 - 12.0 %   Eosinophils Relative 0.4 0.0 - 5.0 %   Basophils Relative 0.5 0.0 - 3.0 %   Neutro Abs 6.4 1.4 - 7.7 K/uL   Lymphs Abs 2.0 0.7 - 4.0 K/uL   Monocytes Absolute 0.8 0.1 - 1.0 K/uL   Eosinophils Absolute 0.0 0.0 - 0.7 K/uL   Basophils Absolute 0.0 0.0 - 0.1 K/uL  Comprehensive metabolic panel  Result Value Ref Range   Sodium 131 (L) 135 - 145 mEq/L   Potassium 4.4 3.5 - 5.1 mEq/L   Chloride 93 (L) 96 - 112 mEq/L   CO2 33 (H) 19 - 32 mEq/L   Glucose, Bld 102 (H) 70 - 99 mg/dL   BUN 19 6 - 23 mg/dL   Creatinine, Ser 1.01 0.40 - 1.50 mg/dL   Total Bilirubin 0.7 0.2 - 1.2 mg/dL   Alkaline Phosphatase 59 39 - 117 U/L   AST 21 0 - 37 U/L   ALT 16 0 - 53 U/L   Total Protein 7.3 6.0 - 8.3 g/dL   Albumin 4.3 3.5 - 5.2 g/dL   Calcium 9.7 8.4 - 10.5 mg/dL   GFR 75.12 >60.00 mL/min  Hemoglobin A1c  Result Value Ref Range   Hgb A1c MFr Bld 5.7 4.6 - 6.5 %       Assessment & Plan:  No diagnosis found.  No orders of the defined types were placed in this encounter.   No orders of the defined types were placed in this encounter.    No Follow-up on file.    Kristi Elayne Guerin, M.D. [facility] [address] [place], [place]  [address] [phone] phone [phone] fax

## 2015-07-18 NOTE — Patient Instructions (Signed)
     IF you received an x-ray today, you will receive an invoice from [facility]. Please contact [facility] at [phone] with questions or concerns regarding your invoice.   IF you received labwork today, you will receive an invoice from [facility]. Please contact [facility] at [phone] with questions or concerns regarding your invoice.   Our billing staff will not be able to assist you with questions regarding bills from these companies.  You will be contacted with the lab results as soon as they are available. The fastest way to get your results is to activate your My Chart account. Instructions are located on the last page of this paperwork. If you have not heard from us regarding the results in 2 weeks, please contact this office.      

## 2015-07-18 NOTE — Progress Notes (Signed)
07/18/2015 1:08 PM   DOB: 1931-02-13 / MRN: [id number]  SUBJECTIVE:  [name]. is a 82 y.o. male presenting for wheezing and cough that started today.  He reports a history of asthma and COPD. He has albuterol inhalers, nebs, and takes pulmicort.  He has tried his nebs today and the did not provide relief today.  Per [facility] he has a 120 pack year history of smoking, however per the patient's wife he did smoke for about 40 years and two packs a day.   He denies chest pain, diaphoresis, leg swelling, presyncope.    He is allergic to ace inhibitors; avelox; tape; lidocaine; morphine and related; codeine; lasix; and other.   He  has a past medical history of CORONARY HEART DISEASE; FIBRILLATION, ATRIAL; SLEEP APNEA (09/2001); ASTHMA; Depression; GERD; DYSLIPIDEMIA; HYPERTENSION; Personal history of alcoholism ([place]); Diverticulosis; Benign liver cyst; Skin cancer; Cataract; Esophageal stricture; Hiatal hernia; Pneumonia (04/2013 hosp); Anxiety; Arthritis; CHF (congestive heart failure) ([place]); COPD (chronic obstructive pulmonary disease) ([place]); Glaucoma; Osteoporosis; and Subclavian arterial stenosis ([place]).    He  reports that he quit smoking about 36 years ago. His smoking use included Cigarettes. He has a 120 pack-year smoking history. He has never used smokeless tobacco. He reports that he does not drink alcohol or use illicit drugs. He  reports that he currently engages in sexual activity. The patient  has past surgical history that includes Coronary artery bypass graft (02/2007); Tonsillectomy; Coronary angioplasty with stent (07/2010); Cataract extraction, bilateral (2007); Hernia repair (unsure ?60's); Cholecystectomy (02/21/2011); Cardiac catheterization (N/A, 11/26/2014); Cardiac catheterization (N/A, 11/26/2014); and Cardiac catheterization (N/A, 02/22/2015).  His family history includes Asthma in his sister; COPD in his sister; Emphysema in his sister; Heart disease in his brother, father, and  mother; Hyperlipidemia in his father, mother, and sister; Hypertension in his father, mother, and sister. There is no history of Colon cancer.  Review of Systems  Constitutional: Negative for fever and chills.  Respiratory: Positive for cough, shortness of breath and wheezing. Negative for hemoptysis and sputum production.   Musculoskeletal: Negative for myalgias.  Skin: Negative for rash.  Neurological: Negative for dizziness and headaches.    Problem list and medications reviewed and updated by myself where necessary, and exist elsewhere in the encounter.   OBJECTIVE:  BP 160/80 mmHg  Pulse 86  Temp(Src) 97.4 F (36.3 C)  Resp 24  Ht 5' (1.524 m)  Wt 147 lb (66.679 kg)  BMI 28.71 kg/m2  SpO2 98%  Physical Exam  Constitutional: He is oriented to person, place, and time. He appears well-developed. He does not appear ill.  Eyes: Conjunctivae and EOM are normal. Pupils are equal, round, and reactive to light.  Cardiovascular: Normal rate and regular rhythm.   Pulmonary/Chest: Effort normal. No respiratory distress. He has wheezes. He has no rales. He exhibits no tenderness.  Abdominal: He exhibits no distension.  Musculoskeletal: Normal range of motion.  Neurological: He is alert and oriented to person, place, and time. No cranial nerve deficit. Coordination normal.  Skin: Skin is warm and dry. He is not diaphoretic.  Psychiatric: He has a normal mood and affect.  Nursing note and vitals reviewed.   Results for orders placed or performed in visit on 07/18/15 (from the past 72 hour(s))  POCT CBC     Status: Abnormal   Collection Time: 07/18/15 12:33 PM  Result Value Ref Range   WBC 6.5 4.6 - 10.2 K/uL   Lymph, poc 1.7 0.6 - 3.4  POC LYMPH PERCENT 26.6 10 - 50 %L   MID (cbc) 0.5 0 - 0.9   POC MID % 7.5 0 - 12 %M   POC Granulocyte 4.3 2 - 6.9   Granulocyte percent 65.9 37 - 80 %G   RBC 3.89 (A) 4.69 - 6.13 M/uL   Hemoglobin 12.3 (A) 14.1 - 18.1 g/dL   HCT, POC 34.2 (A)  43.5 - 53.7 %   MCV 87.8 80 - 97 fL   MCH, POC 31.6 (A) 27 - 31.2 pg   MCHC 36.0 (A) 31.8 - 35.4 g/dL   RDW, POC 14.4 %   Platelet Count, POC 227 142 - 424 K/uL   MPV 6.5 0 - 99.8 fL    No results found.  ASSESSMENT AND PLAN  [place] was seen today for shortness of breath.  Diagnoses and all orders for this visit:  Wheezing: His wheezing cleared with one round of duonebs. BP somewhat elevated today but not surprising given he is having an flare. Given that he is doing everything right at home with regard to COPD maintenance therapy and is still flaring I have given him 125 solumedrol, as I am concerned that doing less may lead to a hospital admission.  He will start doxy today given problem 3, and will start a dose pack tomorrow.  I have given him ipratropium nebs to use at home as this worked so well for him today.  Advised he may only use this every 8 hours in conjunction with his albuterol.  He was advised to return tomorrow or the next day if he is not better.   -     POCT CBC -     albuterol (PROVENTIL) (2.5 MG/3ML) 0.083% nebulizer solution 2.5 mg; Take 3 mLs (2.5 mg total) by nebulization once. -     ipratropium (ATROVENT) nebulizer solution 0.5 mg; Take 2.5 mLs (0.5 mg total) by nebulization once. -     methylPREDNISolone sodium succinate (SOLU-MEDROL) 125 mg/2 mL injection 125 mg; Inject 2 mLs (125 mg total) into the muscle once.  History of asthma -     ipratropium (ATROVENT) 0.02 % nebulizer solution; Take 2.5 mLs (0.5 mg total) by nebulization every 8 (eight) hours.  COPD exacerbation (HCC) -     doxycycline (VIBRAMYCIN) 100 MG capsule; Take 1 capsule (100 mg total) by mouth 2 (two) times daily. -     predniSONE (DELTASONE) 20 MG tablet; Take 3 in the morning for 3 days, then 2 in the morning for 3 days, and then 1 in the morning for 3 days.    The patient was advised to call or return to clinic if he does not see an improvement in symptoms or to seek the care of the  closest emergency department if he worsens with the above plan.   Philis Fendt, MHS, PA-C [facility] 07/18/2015 1:08 PM

## 2015-07-20 DIAGNOSIS — I639 Cerebral infarction, unspecified: Secondary | ICD-10-CM | POA: Diagnosis not present

## 2015-07-20 DIAGNOSIS — M6281 Muscle weakness (generalized): Secondary | ICD-10-CM | POA: Diagnosis not present

## 2015-07-20 DIAGNOSIS — R262 Difficulty in walking, not elsewhere classified: Secondary | ICD-10-CM | POA: Diagnosis not present

## 2015-07-20 DIAGNOSIS — R2681 Unsteadiness on feet: Secondary | ICD-10-CM | POA: Diagnosis not present

## 2015-07-21 DIAGNOSIS — G4733 Obstructive sleep apnea (adult) (pediatric): Secondary | ICD-10-CM | POA: Diagnosis not present

## 2015-07-23 DIAGNOSIS — M6281 Muscle weakness (generalized): Secondary | ICD-10-CM | POA: Diagnosis not present

## 2015-07-23 DIAGNOSIS — I639 Cerebral infarction, unspecified: Secondary | ICD-10-CM | POA: Diagnosis not present

## 2015-07-23 DIAGNOSIS — R262 Difficulty in walking, not elsewhere classified: Secondary | ICD-10-CM | POA: Diagnosis not present

## 2015-07-23 DIAGNOSIS — R2681 Unsteadiness on feet: Secondary | ICD-10-CM | POA: Diagnosis not present

## 2015-07-26 DIAGNOSIS — I639 Cerebral infarction, unspecified: Secondary | ICD-10-CM | POA: Diagnosis not present

## 2015-07-26 DIAGNOSIS — R2681 Unsteadiness on feet: Secondary | ICD-10-CM | POA: Diagnosis not present

## 2015-07-26 DIAGNOSIS — R262 Difficulty in walking, not elsewhere classified: Secondary | ICD-10-CM | POA: Diagnosis not present

## 2015-07-26 DIAGNOSIS — M6281 Muscle weakness (generalized): Secondary | ICD-10-CM | POA: Diagnosis not present

## 2015-07-28 DIAGNOSIS — R262 Difficulty in walking, not elsewhere classified: Secondary | ICD-10-CM | POA: Diagnosis not present

## 2015-07-28 DIAGNOSIS — M6281 Muscle weakness (generalized): Secondary | ICD-10-CM | POA: Diagnosis not present

## 2015-07-28 DIAGNOSIS — R2681 Unsteadiness on feet: Secondary | ICD-10-CM | POA: Diagnosis not present

## 2015-07-28 DIAGNOSIS — I639 Cerebral infarction, unspecified: Secondary | ICD-10-CM | POA: Diagnosis not present

## 2015-07-29 ENCOUNTER — Other Ambulatory Visit: Payer: Self-pay

## 2015-07-29 MED ORDER — PRAVASTATIN SODIUM 80 MG PO TABS: 80.0000 mg | ORAL_TABLET | Freq: Every morning | ORAL | Status: DC

## 2015-08-03 DIAGNOSIS — R2681 Unsteadiness on feet: Secondary | ICD-10-CM | POA: Diagnosis not present

## 2015-08-03 DIAGNOSIS — M6281 Muscle weakness (generalized): Secondary | ICD-10-CM | POA: Diagnosis not present

## 2015-08-03 DIAGNOSIS — R262 Difficulty in walking, not elsewhere classified: Secondary | ICD-10-CM | POA: Diagnosis not present

## 2015-08-03 DIAGNOSIS — I639 Cerebral infarction, unspecified: Secondary | ICD-10-CM | POA: Diagnosis not present

## 2015-08-06 DIAGNOSIS — R2681 Unsteadiness on feet: Secondary | ICD-10-CM | POA: Diagnosis not present

## 2015-08-06 DIAGNOSIS — I639 Cerebral infarction, unspecified: Secondary | ICD-10-CM | POA: Diagnosis not present

## 2015-08-06 DIAGNOSIS — R262 Difficulty in walking, not elsewhere classified: Secondary | ICD-10-CM | POA: Diagnosis not present

## 2015-08-06 DIAGNOSIS — M6281 Muscle weakness (generalized): Secondary | ICD-10-CM | POA: Diagnosis not present

## 2015-08-09 ENCOUNTER — Telehealth: Payer: Self-pay | Admitting: Internal Medicine

## 2015-08-09 DIAGNOSIS — H401121 Primary open-angle glaucoma, left eye, mild stage: Secondary | ICD-10-CM | POA: Diagnosis not present

## 2015-08-09 DIAGNOSIS — J309 Allergic rhinitis, unspecified: Secondary | ICD-10-CM | POA: Diagnosis not present

## 2015-08-09 DIAGNOSIS — H401113 Primary open-angle glaucoma, right eye, severe stage: Secondary | ICD-10-CM | POA: Diagnosis not present

## 2015-08-09 DIAGNOSIS — H47011 Ischemic optic neuropathy, right eye: Secondary | ICD-10-CM | POA: Diagnosis not present

## 2015-08-09 DIAGNOSIS — Z961 Presence of intraocular lens: Secondary | ICD-10-CM | POA: Diagnosis not present

## 2015-08-09 NOTE — Telephone Encounter (Signed)
Allergy Serum Extract Date Mixed: 08/09/2015 Vial: A Strength: 1:10 Here/Mail/Pick Up: Mail Mixed By: Desmond Dike, CMA Last OV: 05/07/2015 Pending OV: 11/05/2015

## 2015-08-10 ENCOUNTER — Other Ambulatory Visit: Payer: Self-pay | Admitting: Emergency Medicine

## 2015-08-10 DIAGNOSIS — I639 Cerebral infarction, unspecified: Secondary | ICD-10-CM | POA: Diagnosis not present

## 2015-08-10 DIAGNOSIS — R2681 Unsteadiness on feet: Secondary | ICD-10-CM | POA: Diagnosis not present

## 2015-08-10 DIAGNOSIS — M6281 Muscle weakness (generalized): Secondary | ICD-10-CM | POA: Diagnosis not present

## 2015-08-10 DIAGNOSIS — R262 Difficulty in walking, not elsewhere classified: Secondary | ICD-10-CM | POA: Diagnosis not present

## 2015-08-10 MED ORDER — LORAZEPAM 0.5 MG PO TABS: 0.5000 mg | ORAL_TABLET | Freq: Three times a day (TID) | ORAL | Status: DC

## 2015-08-10 MED ORDER — VALSARTAN 320 MG PO TABS: 320.0000 mg | ORAL_TABLET | Freq: Every day | ORAL | Status: AC

## 2015-08-10 NOTE — Telephone Encounter (Signed)
Refill faxed to local [facility]

## 2015-08-12 NOTE — Progress Notes (Signed)
No show

## 2015-08-13 ENCOUNTER — Encounter: Payer: Medicare Other | Admitting: Cardiology

## 2015-08-13 DIAGNOSIS — R2681 Unsteadiness on feet: Secondary | ICD-10-CM | POA: Diagnosis not present

## 2015-08-13 DIAGNOSIS — I639 Cerebral infarction, unspecified: Secondary | ICD-10-CM | POA: Diagnosis not present

## 2015-08-13 DIAGNOSIS — R262 Difficulty in walking, not elsewhere classified: Secondary | ICD-10-CM | POA: Diagnosis not present

## 2015-08-13 DIAGNOSIS — M6281 Muscle weakness (generalized): Secondary | ICD-10-CM | POA: Diagnosis not present

## 2015-08-16 ENCOUNTER — Other Ambulatory Visit: Payer: Self-pay | Admitting: Internal Medicine

## 2015-08-16 ENCOUNTER — Telehealth: Payer: Self-pay | Admitting: Internal Medicine

## 2015-08-16 MED ORDER — ALBUTEROL SULFATE HFA 108 (90 BASE) MCG/ACT IN AERS: 2.0000 | INHALATION_SPRAY | Freq: Four times a day (QID) | RESPIRATORY_TRACT | Status: AC | PRN

## 2015-08-16 NOTE — Telephone Encounter (Signed)
Patient took an extra metoprolol and norvasc in the afternoon and his BP has been high. Patient is on clonidine and the Ms. Beissel has been worried about his fluctuating blood pressure. She was told to follow up with the PCP and possibly be started on labetalol/coreg etc.

## 2015-08-17 DIAGNOSIS — I639 Cerebral infarction, unspecified: Secondary | ICD-10-CM | POA: Diagnosis not present

## 2015-08-17 DIAGNOSIS — M6281 Muscle weakness (generalized): Secondary | ICD-10-CM | POA: Diagnosis not present

## 2015-08-17 DIAGNOSIS — R262 Difficulty in walking, not elsewhere classified: Secondary | ICD-10-CM | POA: Diagnosis not present

## 2015-08-17 DIAGNOSIS — R2681 Unsteadiness on feet: Secondary | ICD-10-CM | POA: Diagnosis not present

## 2015-08-20 ENCOUNTER — Telehealth: Payer: Self-pay | Admitting: Internal Medicine

## 2015-08-20 DIAGNOSIS — I639 Cerebral infarction, unspecified: Secondary | ICD-10-CM | POA: Diagnosis not present

## 2015-08-20 DIAGNOSIS — R262 Difficulty in walking, not elsewhere classified: Secondary | ICD-10-CM | POA: Diagnosis not present

## 2015-08-20 DIAGNOSIS — M6281 Muscle weakness (generalized): Secondary | ICD-10-CM | POA: Diagnosis not present

## 2015-08-20 DIAGNOSIS — R2681 Unsteadiness on feet: Secondary | ICD-10-CM | POA: Diagnosis not present

## 2015-08-20 NOTE — Telephone Encounter (Signed)
Attempted to call pt. Someone picked up the phone but never spoke then hung up. WCB.

## 2015-08-24 DIAGNOSIS — I639 Cerebral infarction, unspecified: Secondary | ICD-10-CM | POA: Diagnosis not present

## 2015-08-24 DIAGNOSIS — R2681 Unsteadiness on feet: Secondary | ICD-10-CM | POA: Diagnosis not present

## 2015-08-24 DIAGNOSIS — R262 Difficulty in walking, not elsewhere classified: Secondary | ICD-10-CM | POA: Diagnosis not present

## 2015-08-24 DIAGNOSIS — M6281 Muscle weakness (generalized): Secondary | ICD-10-CM | POA: Diagnosis not present

## 2015-08-24 MED ORDER — MOMETASONE FURO-FORMOTEROL FUM 100-5 MCG/ACT IN AERO: INHALATION_SPRAY | RESPIRATORY_TRACT | Status: DC

## 2015-08-24 NOTE — Telephone Encounter (Signed)
Spoke with pt, refill sent to verified pharmacy for [place].  Nothing further needed.

## 2015-08-30 ENCOUNTER — Telehealth: Payer: Self-pay

## 2015-08-30 NOTE — Telephone Encounter (Signed)
Left message advising patient that prolia injection is due----his copay has dropped since last time---insurance verified and he has estimated copay of $210---assistance programs could be available for him if he still wants injection and can't afford copay---if patient calls back, let Kasen Adduci know if patient wants assistance program

## 2015-08-31 ENCOUNTER — Encounter: Payer: Self-pay | Admitting: *Deleted

## 2015-09-03 ENCOUNTER — Ambulatory Visit: Payer: Medicare Other | Admitting: Internal Medicine

## 2015-09-08 ENCOUNTER — Ambulatory Visit (INDEPENDENT_AMBULATORY_CARE_PROVIDER_SITE_OTHER): Payer: Medicare Other | Admitting: Internal Medicine

## 2015-09-08 ENCOUNTER — Encounter: Payer: Self-pay | Admitting: Internal Medicine

## 2015-09-08 VITALS — BP 116/58 | HR 64 | Temp 98.4°F | Resp 18 | Wt 143.0 lb

## 2015-09-08 DIAGNOSIS — K219 Gastro-esophageal reflux disease without esophagitis: Secondary | ICD-10-CM

## 2015-09-08 DIAGNOSIS — G479 Sleep disorder, unspecified: Secondary | ICD-10-CM | POA: Diagnosis not present

## 2015-09-08 DIAGNOSIS — I10 Essential (primary) hypertension: Secondary | ICD-10-CM

## 2015-09-08 DIAGNOSIS — R600 Localized edema: Secondary | ICD-10-CM | POA: Diagnosis not present

## 2015-09-08 MED ORDER — METOPROLOL TARTRATE 100 MG PO TABS: 100.0000 mg | ORAL_TABLET | Freq: Two times a day (BID) | ORAL | Status: DC

## 2015-09-08 MED ORDER — AMLODIPINE BESYLATE 2.5 MG PO TABS: ORAL_TABLET | ORAL | Status: DC

## 2015-09-08 MED ORDER — TRAZODONE HCL 50 MG PO TABS: 25.0000 mg | ORAL_TABLET | Freq: Every evening | ORAL | Status: DC | PRN

## 2015-09-08 NOTE — Assessment & Plan Note (Signed)
Fatigued because he is not sleeping enough at night Trial of trazodone - will titrate if needed Avoid naps during the day

## 2015-09-08 NOTE — Assessment & Plan Note (Signed)
Taking an extra metoprolol most days Taking his other medication daily as prescribed Will try increasing his nighttime norvasc to 5 mg to see if that helps Continue extra metoprolol if needed Will schedule a follow up with cardiology F/u with me in 3 months sooner if needed

## 2015-09-08 NOTE — Assessment & Plan Note (Signed)
B/l legs Controlled Compression stockings nightly Taking torsemide daily

## 2015-09-08 NOTE — Progress Notes (Signed)
Pre visit review using our clinic review tool, if applicable. No additional management support is needed unless otherwise documented below in the visit note. 

## 2015-09-08 NOTE — Assessment & Plan Note (Signed)
GERD controlled Continue daily medication  

## 2015-09-08 NOTE — Progress Notes (Signed)
Subjective:    Patient ID: [name]., male    DOB: [birth year], 82 y.o.   MRN: [id number]  HPI He is here for follow up.  Hypertension: He is taking his medication daily. He is compliant with a low sodium diet.  He denies chest pain, palpitations, shortness of breath and regular headaches. He is exercising regularly with PT.     His BP is high in the morning, 177/88 this morning.  He will take his blood pressure medication as normal.   He checks his BP again at 3-4 pm 156/66 - 170's/? and then he takes an extra dose of metoprolol if his SBP > 167.  He will check his BP at night and take his regular medications.  He is taking an extra metoprolol most days.    He was instructed to take an extra metoprolol if his BP was elevated by cardiology. the medication does work well.  GERD:  He is taking his medication daily as prescribed.  He denies any GERD symptoms and feels his GERD is well controlled.   Edema:  He has chronic leg edema.  He is taking the water pill daily and wears his compression stockings daily.    He does not sleep well.  He used to work shift work and only sleeps 4 hours a night.  He wakes after that and can not sleep more.  He often is tired during the day and he sometimes naps.  If he sits down he will often fall asleep.  He is concerned about falling asleep while driving.   Medications and allergies reviewed with patient and updated if appropriate.  Patient Active Problem List   Diagnosis Date Noted  . Cerebral embolism with cerebral infarction 05/18/2015  . Coronary artery disease involving native coronary artery of native heart   . Acute encephalopathy 05/17/2015  . Numbness and tingling in right hand 05/17/2015  . Hyponatremia 05/17/2015  . Pain in the chest   . Essential hypertension   . Intermittent confusion   . Cough 04/13/2015  . PAD (peripheral artery disease) ([place]) 02/22/2015  . Subclavian artery stenosis, left 12/23/2014  . NSTEMI (non-ST  elevated myocardial infarction) ([place])   . Chest pain 11/25/2014  . Tachycardia 11/24/2014  . Hx of CABG 2008 08/31/2014  . CAD S/P S-PDA DES 2012 08/31/2014  . PVD (peripheral vascular disease) ([place]) 08/31/2014  . Murmur 08/31/2014  . Anxiety about health 08/31/2014  . Edema 07/27/2014  . Hypertensive urgency 07/20/2014  . Accelerated hypertension 04/22/2014  . Abnormal chest x-ray 03/26/2014  . Abnormal CT scan, esophagus 01/07/2014  . Generalized anxiety disorder   . Anemia, iron deficiency   . PAF (paroxysmal atrial fibrillation) ([place]) 03/08/2013  . Chronic diastolic heart failure ([place]) 10/30/2012  . Osteoporosis/severe osteopenia 06/12/2012  . Bruit of left carotid artery 09/27/2011  . GERD 11/19/2009  . Hyperlipidemia with target LDL less than 100 10/04/2009  . ESOPHAGEAL STRICTURE 12/03/2007  . Coronary atherosclerosis 05/10/2007  . Seasonal and perennial allergic rhinitis 05/10/2007  . Asthma with COPD ([place]) 05/10/2007  . Obstructive sleep apnea 05/10/2007    Current Outpatient Prescriptions on File Prior to Visit  Medication Sig Dispense Refill  . acetaminophen (TYLENOL) 325 MG tablet Take 650 mg by mouth every 6 (six) hours as needed for moderate pain or headache.     . albuterol (PROAIR HFA) 108 (90 Base) MCG/ACT inhaler Inhale 2 puffs into the lungs every 6 (six) hours as needed for wheezing  or shortness of breath. 1 Inhaler 12  . amLODipine (NORVASC) 2.5 MG tablet Take 1 tablet (2.5 mg total) by mouth 2 (two) times daily. 60 tablet 11  . apixaban (ELIQUIS) 5 MG TABS tablet Take 1 tablet (5 mg total) by mouth 2 (two) times daily. 60 tablet 11  . budesonide (PULMICORT) 0.25 MG/2ML nebulizer solution Take 0.25 mg by nebulization 4 (four) times daily as needed (for SOB). For asthma    . calcium-vitamin D (OSCAL WITH D) 500-200 MG-UNIT per tablet Take 1 tablet by mouth 2 (two) times daily.    . cholestyramine (QUESTRAN) 4 g packet MIX AND DRINK 1 PACKET BY MOUTH TWICE A DAY  IF NEEDED FOR LOOSE STOOLS 60 packet 2  . cloNIDine (CATAPRES) 0.3 MG tablet take 1 tablet by mouth twice a day 60 tablet 6  . clopidogrel (PLAVIX) 75 MG tablet Take 1 tablet (75 mg total) by mouth daily with breakfast. 30 tablet 03  . ipratropium (ATROVENT) 0.02 % nebulizer solution Take 2.5 mLs (0.5 mg total) by nebulization every 8 (eight) hours. 75 mL 12  . ipratropium-albuterol (DUONEB) 0.5-2.5 (3) MG/3ML SOLN USE ONE VIAL VIA NEBULIZER EVERY 6 HOURS AS NEEDED FOR SHORTNESS OF BREATH 360 mL 5  . isosorbide mononitrate (IMDUR) 60 MG 24 hr tablet Take 1 tablet (60 mg total) by mouth 2 (two) times daily. 180 tablet 0  . latanoprost (XALATAN) 0.005 % ophthalmic solution Place 1 drop into both eyes at bedtime.     Marland Kitchen LORazepam (ATIVAN) 0.5 MG tablet Take 1 tablet (0.5 mg total) by mouth 3 (three) times daily. 90 tablet 0  . metoprolol tartrate (LOPRESSOR) 100 MG tablet Take 1 tablet (100 mg total) by mouth 2 (two) times daily. 60 tablet 11  . mometasone-formoterol (DULERA) 100-5 MCG/ACT AERO inhale 2 puffs by mouth once daily (THEN RINSE MOUTH AFTER USE) 17.6 g 5  . montelukast (SINGULAIR) 10 MG tablet Take 1 tablet (10 mg total) by mouth at bedtime. 90 tablet 3  . nitroGLYCERIN (NITROSTAT) 0.4 MG SL tablet Place 1 tablet (0.4 mg total) under the tongue every 5 (five) minutes as needed for chest pain. 25 tablet 0  . NONFORMULARY OR COMPOUNDED ITEM Allergy Vaccine 1:10 Given at Home    . pantoprazole (PROTONIX) 40 MG tablet take 1 tablet by mouth twice a day (Patient taking differently: take 40 mg by mouth twice a day) 60 tablet 11  . pravastatin (PRAVACHOL) 80 MG tablet Take 1 tablet (80 mg total) by mouth every morning. 30 tablet 2  . Probiotic Product (PROBIOTIC DAILY) CAPS Take 1 capsule by mouth at bedtime.    Marland Kitchen RA VITAMIN B-12 TR 1000 MCG TBCR take 1 tablet by mouth once daily (Patient taking differently: take 1000 mcg by mouth once daily) 30 tablet 11  . torsemide (DEMADEX) 20 MG tablet Take  20 mg by mouth daily.    . valsartan (DIOVAN) 320 MG tablet Take 1 tablet (320 mg total) by mouth daily. 90 tablet 3   No current facility-administered medications on file prior to visit.    Past Medical History  Diagnosis Date  . CORONARY HEART DISEASE     a. s/p CABG;  b. cath 06/27/10: S-Dx occluded, S-PDA 80-90% (tx with PCI); S-OM ok, L-LAD ok;  EF 65-70%  c.  s/p Promus DES to S-PDA 06/2010;   d. Cath 8 2016 LIMA to the LAD patent, SVG to posterior lateral subtotal, SVG to diagonal occluded chronically. The patient had stenting of  the native right vessel.  Marland Kitchen FIBRILLATION, ATRIAL   . SLEEP APNEA 09/2001    NPSG AHI 22/HR  . ASTHMA   . Depression   . GERD     with HH, hx esophageal stricture  . DYSLIPIDEMIA   . HYPERTENSION     Echo 3/12: EF 55-60%; mod LVH; mild AS/AI; LAE; PASP 38; mild pulmo HTN  . Personal history of alcoholism ([place])   . Diverticulosis   . Benign liver cyst   . Skin cancer     L forearm  . Cataract     surgery to both eyes  . Esophageal stricture   . Hiatal hernia   . Pneumonia 04/2013 hosp  . Anxiety   . Arthritis   . CHF (congestive heart failure) ([place])   . COPD (chronic obstructive pulmonary disease) ([place])   . Glaucoma   . Osteoporosis   . Subclavian arterial stenosis [facility])     Past Surgical History  Procedure Laterality Date  . Coronary artery bypass graft  02/2007  . Tonsillectomy    . Coronary angioplasty with stent placement  07/2010  . Cataract extraction, bilateral  2007  . Hernia repair  unsure ?60's  . Cholecystectomy  02/21/2011    Procedure: LAPAROSCOPIC CHOLECYSTECTOMY WITH INTRAOPERATIVE CHOLANGIOGRAM;  Surgeon: Pedro Earls, MD;  Location: [facility];  Service: General;  Laterality: N/A;  . Cardiac catheterization N/A 11/26/2014    Procedure: Left Heart Cath and Cors/Grafts Angiography;  Surgeon: Sherren Mocha, MD;  Location: [place] [facility];  Service: Cardiovascular;  Laterality: N/A;  . Cardiac catheterization N/A 11/26/2014     Procedure: Coronary Stent Intervention;  Surgeon: Sherren Mocha, MD;  Location: [place] [facility];  Service: Cardiovascular;  Laterality: N/A;  . Peripheral vascular catheterization N/A 02/22/2015    Procedure: Upper Extremity Angiography;  Surgeon: Lorretta Harp, MD;  Location: [place] [facility];  Service: Cardiovascular;  Laterality: N/A;    Social History   Social History  . Marital Status: Married    Spouse Name: N/A  . Number of Children: 2  . Years of Education: N/A   Occupational History  . retired     56 years   Social History Main Topics  . Smoking status: Former Smoker -- 3.00 packs/day for 40 years    Types: Cigarettes    Quit date: 04/11/1979  . Smokeless tobacco: Never Used  . Alcohol Use: No     Comment: quit drinking 40+ years  . Drug Use: No  . Sexual Activity: Yes   Other Topics Concern  . 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 Married lives with wife 2 children    Family History  Problem Relation Age of Onset  . COPD Sister   . Asthma Sister   . Emphysema Sister   . Hypertension Sister   . Hyperlipidemia Sister   . Hypertension Mother   . Heart disease Mother   . Hyperlipidemia Mother   . Colon cancer Neg Hx   . Heart disease Brother   . Heart disease Father   . Hyperlipidemia Father   . Hypertension Father     Review of Systems  Constitutional: Negative for fever.  Respiratory: Positive for wheezing (occasional). Negative for cough and shortness of breath.   Cardiovascular: Positive for leg swelling (controlled iwth compression stockings and water pill). Negative for chest pain and palpitations.  Neurological: Positive for headaches (occasionally with very high BP). Negative for dizziness and  light-headedness.       Objective:   Filed Vitals:   09/08/15 1556  BP: 116/58  Pulse: 64  Temp: 98.4 F (36.9 C)  Resp: 18   Filed Weights   09/08/15 1556  Weight: 143 lb (64.864 kg)   Body mass index  is 27.93 kg/(m^2).   Physical Exam Constitutional: Appears well-developed and well-nourished. No distress.  Neck: Neck supple. No tracheal deviation present. No thyromegaly present.  No carotid bruit. No cervical adenopathy.   Cardiovascular: Normal rate, regular rhythm and normal heart sounds.   3/6 systolic murmur heard.  No edema - wearing compression stockings Pulmonary/Chest: Effort normal and breath sounds normal. No respiratory distress. No wheezes.         Assessment & Plan:   See Problem List for Assessment and Plan of chronic medical problems.

## 2015-09-08 NOTE — Patient Instructions (Addendum)
  Medications reviewed and updated.  Changes include trying trazodone for sleep.  Take as directed.  We will increase the norvasc to two pills at night and see if that help control your blood pressure.    Your prescription(s) have been submitted to your pharmacy. Please take as directed and contact our office if you believe you are having problem(s) with the medication(s).  Please followup in 3 months, sooner if needed

## 2015-09-09 ENCOUNTER — Telehealth: Payer: Self-pay | Admitting: Emergency Medicine

## 2015-09-09 MED ORDER — METOPROLOL TARTRATE 100 MG PO TABS: 100.0000 mg | ORAL_TABLET | Freq: Two times a day (BID) | ORAL | Status: DC

## 2015-09-10 DIAGNOSIS — I639 Cerebral infarction, unspecified: Secondary | ICD-10-CM | POA: Diagnosis not present

## 2015-09-10 DIAGNOSIS — M6281 Muscle weakness (generalized): Secondary | ICD-10-CM | POA: Diagnosis not present

## 2015-09-10 DIAGNOSIS — R2681 Unsteadiness on feet: Secondary | ICD-10-CM | POA: Diagnosis not present

## 2015-09-10 DIAGNOSIS — R262 Difficulty in walking, not elsewhere classified: Secondary | ICD-10-CM | POA: Diagnosis not present

## 2015-09-14 DIAGNOSIS — M6281 Muscle weakness (generalized): Secondary | ICD-10-CM | POA: Diagnosis not present

## 2015-09-14 DIAGNOSIS — R262 Difficulty in walking, not elsewhere classified: Secondary | ICD-10-CM | POA: Diagnosis not present

## 2015-09-14 DIAGNOSIS — R2681 Unsteadiness on feet: Secondary | ICD-10-CM | POA: Diagnosis not present

## 2015-09-14 DIAGNOSIS — I639 Cerebral infarction, unspecified: Secondary | ICD-10-CM | POA: Diagnosis not present

## 2015-09-16 ENCOUNTER — Telehealth: Payer: Self-pay | Admitting: *Deleted

## 2015-09-16 ENCOUNTER — Telehealth: Payer: Self-pay

## 2015-09-16 MED ORDER — LORAZEPAM 0.5 MG PO TABS: 0.5000 mg | ORAL_TABLET | Freq: Three times a day (TID) | ORAL | Status: DC

## 2015-09-16 NOTE — Telephone Encounter (Signed)
printed

## 2015-09-16 NOTE — Telephone Encounter (Signed)
Rec'd fax pt requesting refill on Lorazepam 0.5mg  last filled 08/13/15...Johny Chess

## 2015-09-16 NOTE — Telephone Encounter (Signed)
Patients wife called and is confused about how he should be taken the medication. Its different on the bottle than how they have took it in the past. Could you please follow up. Thank you isosorbide mononitrate (IMDUR) 60 MG 24 hr tablet

## 2015-09-17 DIAGNOSIS — I639 Cerebral infarction, unspecified: Secondary | ICD-10-CM | POA: Diagnosis not present

## 2015-09-17 DIAGNOSIS — M6281 Muscle weakness (generalized): Secondary | ICD-10-CM | POA: Diagnosis not present

## 2015-09-17 DIAGNOSIS — R262 Difficulty in walking, not elsewhere classified: Secondary | ICD-10-CM | POA: Diagnosis not present

## 2015-09-17 DIAGNOSIS — R2681 Unsteadiness on feet: Secondary | ICD-10-CM | POA: Diagnosis not present

## 2015-09-17 NOTE — Telephone Encounter (Signed)
Please advise 

## 2015-09-17 NOTE — Telephone Encounter (Signed)
We did not make any changes to the below medication and it was last prescribed by cardiology so they may need to follow up with them.  I am not sure if cardiology instructed him to do anything different

## 2015-09-17 NOTE — Telephone Encounter (Signed)
RX faxed to [facility] 

## 2015-09-17 NOTE — Telephone Encounter (Signed)
Spoke with pts wife, advised to call [facility] to verify

## 2015-09-20 ENCOUNTER — Ambulatory Visit (INDEPENDENT_AMBULATORY_CARE_PROVIDER_SITE_OTHER): Payer: Medicare Other | Admitting: Physician Assistant

## 2015-09-20 VITALS — BP 182/100 | HR 88 | Temp 97.5°F | Resp 18 | Ht 60.0 in

## 2015-09-20 DIAGNOSIS — R030 Elevated blood-pressure reading, without diagnosis of hypertension: Secondary | ICD-10-CM

## 2015-09-20 DIAGNOSIS — IMO0001 Reserved for inherently not codable concepts without codable children: Secondary | ICD-10-CM

## 2015-09-20 LAB — POCT URINALYSIS DIP (MANUAL ENTRY)
BILIRUBIN UA: NEGATIVE
GLUCOSE UA: NEGATIVE
Ketones, POC UA: NEGATIVE
Leukocytes, UA: NEGATIVE
NITRITE UA: NEGATIVE
Protein Ur, POC: 100 — AB
SPEC GRAV UA: 1.015
Urobilinogen, UA: 0.2
pH, UA: 7

## 2015-09-20 LAB — POCT CBC
GRANULOCYTE PERCENT: 65.8 % (ref 37–80)
HEMATOCRIT: 42.8 % — AB (ref 43.5–53.7)
Hemoglobin: 15.6 g/dL (ref 14.1–18.1)
Lymph, poc: 2.9 (ref 0.6–3.4)
MCH: 30.5 pg (ref 27–31.2)
MCHC: 36.4 g/dL — AB (ref 31.8–35.4)
MCV: 83.6 fL (ref 80–97)
MID (CBC): 0.6 (ref 0–0.9)
MPV: 6.5 fL (ref 0–99.8)
POC GRANULOCYTE: 6.8 (ref 2–6.9)
POC LYMPH PERCENT: 28.3 %L (ref 10–50)
POC MID %: 5.9 % (ref 0–12)
Platelet Count, POC: 224 10*3/uL (ref 142–424)
RBC: 5.12 M/uL (ref 4.69–6.13)
RDW, POC: 15.1 %
WBC: 10.4 10*3/uL — AB (ref 4.6–10.2)

## 2015-09-20 NOTE — Progress Notes (Signed)
[facility] [address]  [place] [address] [phone]  Date:  09/20/2015   Name:  [name].   DOB:  Sep 20, 1930   MRN:  [id number]  PCP:  Binnie Rail, MD    History of Present Illness:  [name]. is a 82 y.o. male patient who presents to [facility] for elevated bp.     This morning 3 am, blood pressure elevated at 198/84.  Yelling this morning, from his bed shaking and cold.  He was given one metoprolol.  Rechecked bp at 7 am and found to be 194/94.  Took 1 of the clonidine.  He had another metoprolol.  No chest pains, dizziness.  He has some trouble breathing.  No vision changes.  No confusion or disoriented.  He is urinating a lot. He has not taken any of his diretic today.  Hochrein, cardiology and and [place] followed. 7/22.  He has not taken any of the torsemide today.  He states that he attempted to not take it, to avoid urinating as much today.   Patient Active Problem List   Diagnosis Date Noted  . Sleeping difficulties 09/08/2015  . Cerebral embolism with cerebral infarction 05/18/2015  . Coronary artery disease involving native coronary artery of native heart   . Acute encephalopathy 05/17/2015  . Numbness and tingling in right hand 05/17/2015  . Hyponatremia 05/17/2015  . Pain in the chest   . Essential hypertension   . Intermittent confusion   . Cough 04/13/2015  . PAD (peripheral artery disease) ([place]) 02/22/2015  . Subclavian artery stenosis, left 12/23/2014  . NSTEMI (non-ST elevated myocardial infarction) ([place])   . Chest pain 11/25/2014  . Tachycardia 11/24/2014  . Hx of CABG 2008 08/31/2014  . CAD S/P S-PDA DES 2012 08/31/2014  . PVD (peripheral vascular disease) ([place]) 08/31/2014  . Murmur 08/31/2014  . Anxiety about health 08/31/2014  . Edema 07/27/2014  . Hypertensive urgency 07/20/2014  . Accelerated hypertension 04/22/2014  . Abnormal chest x-ray 03/26/2014  . Abnormal CT scan, esophagus 01/07/2014  . Generalized  anxiety disorder   . Anemia, iron deficiency   . PAF (paroxysmal atrial fibrillation) ([place]) 03/08/2013  . Chronic diastolic heart failure ([place]) 10/30/2012  . Osteoporosis/severe osteopenia 06/12/2012  . Bruit of left carotid artery 09/27/2011  . GERD 11/19/2009  . Hyperlipidemia with target LDL less than 100 10/04/2009  . ESOPHAGEAL STRICTURE 12/03/2007  . Coronary atherosclerosis 05/10/2007  . Seasonal and perennial allergic rhinitis 05/10/2007  . Asthma with COPD ([place]) 05/10/2007  . Obstructive sleep apnea 05/10/2007    Past Medical History  Diagnosis Date  . CORONARY HEART DISEASE     a. s/p CABG;  b. cath 06/27/10: S-Dx occluded, S-PDA 80-90% (tx with PCI); S-OM ok, L-LAD ok;  EF 65-70%  c.  s/p Promus DES to S-PDA 06/2010;   d. Cath 8 2016 LIMA to the LAD patent, SVG to posterior lateral subtotal, SVG to diagonal occluded chronically. The patient had stenting of the native right vessel.  Marland Kitchen FIBRILLATION, ATRIAL   . SLEEP APNEA 09/2001    NPSG AHI 22/HR  . ASTHMA   . Depression   . GERD     with HH, hx esophageal stricture  . DYSLIPIDEMIA   . HYPERTENSION     Echo 3/12: EF 55-60%; mod LVH; mild AS/AI; LAE; PASP 38; mild pulmo HTN  . Personal history of alcoholism ([place])   . Diverticulosis   . Benign liver cyst   .  Skin cancer     L forearm  . Cataract     surgery to both eyes  . Esophageal stricture   . Hiatal hernia   . Pneumonia 04/2013 hosp  . Anxiety   . Arthritis   . CHF (congestive heart failure) ([place])   . COPD (chronic obstructive pulmonary disease) ([place])   . Glaucoma   . Osteoporosis   . Subclavian arterial stenosis [facility])     Past Surgical History  Procedure Laterality Date  . Coronary artery bypass graft  02/2007  . Tonsillectomy    . Coronary angioplasty with stent placement  07/2010  . Cataract extraction, bilateral  2007  . Hernia repair  unsure ?60's  . Cholecystectomy  02/21/2011    Procedure: LAPAROSCOPIC CHOLECYSTECTOMY WITH INTRAOPERATIVE  CHOLANGIOGRAM;  Surgeon: Pedro Earls, MD;  [facility];  Service: General;  Laterality: N/A;  . Cardiac catheterization N/A 11/26/2014    Procedure: Left Heart Cath and Cors/Grafts Angiography;  Surgeon: Sherren Mocha, MD;  Location: [place] [facility];  Service: Cardiovascular;  Laterality: N/A;  . Cardiac catheterization N/A 11/26/2014    Procedure: Coronary Stent Intervention;  Surgeon: Sherren Mocha, MD;  Location: [place] [facility];  Service: Cardiovascular;  Laterality: N/A;  . Peripheral vascular catheterization N/A 02/22/2015    Procedure: Upper Extremity Angiography;  Surgeon: Lorretta Harp, MD;  Location: [place] [facility];  Service: Cardiovascular;  Laterality: N/A;    Social History  Substance Use Topics  . Smoking status: Former Smoker -- 3.00 packs/day for 40 years    Types: Cigarettes    Quit date: 04/11/1979  . Smokeless tobacco: Never Used  . Alcohol Use: No     Comment: quit drinking 40+ years    Family History  Problem Relation Age of Onset  . COPD Sister   . Asthma Sister   . Emphysema Sister   . Hypertension Sister   . Hyperlipidemia Sister   . Hypertension Mother   . Heart disease Mother   . Hyperlipidemia Mother   . Colon cancer Neg Hx   . Heart disease Brother   . Heart disease Father   . Hyperlipidemia Father   . Hypertension Father     Allergies  Allergen Reactions  . Ace Inhibitors Other (See Comments)    Severe asthma (COPD)  . Avelox [Moxifloxacin Hcl In Nacl] Other (See Comments)    Gum pain  . Tape Other (See Comments)    Adhesive and tape reaction-tears skin off   . Lidocaine Other (See Comments)    Possible reaction? Dizziness, confusion, syncope  . Morphine And Related Nausea And Vomiting  . Codeine Nausea And Vomiting  . Lasix [Furosemide] Itching and Swelling  . Other Other (See Comments)    Maple trees, allergy symptoms    Medication list has been reviewed and updated.  Current Outpatient Prescriptions on File  Prior to Visit  Medication Sig Dispense Refill  . acetaminophen (TYLENOL) 325 MG tablet Take 650 mg by mouth every 6 (six) hours as needed for moderate pain or headache.     . albuterol (PROAIR HFA) 108 (90 Base) MCG/ACT inhaler Inhale 2 puffs into the lungs every 6 (six) hours as needed for wheezing or shortness of breath. 1 Inhaler 12  . amLODipine (NORVASC) 2.5 MG tablet Take one tab daily in the morning and two tabs in the evening 90 tablet 11  . apixaban (ELIQUIS) 5 MG TABS tablet Take 1 tablet (5 mg total) by mouth 2 (  two) times daily. 60 tablet 11  . budesonide (PULMICORT) 0.25 MG/2ML nebulizer solution Take 0.25 mg by nebulization 4 (four) times daily as needed (for SOB). For asthma    . calcium-vitamin D (OSCAL WITH D) 500-200 MG-UNIT per tablet Take 1 tablet by mouth 2 (two) times daily.    . cholestyramine (QUESTRAN) 4 g packet MIX AND DRINK 1 PACKET BY MOUTH TWICE A DAY IF NEEDED FOR LOOSE STOOLS 60 packet 2  . cloNIDine (CATAPRES) 0.3 MG tablet take 1 tablet by mouth twice a day 60 tablet 6  . clopidogrel (PLAVIX) 75 MG tablet Take 1 tablet (75 mg total) by mouth daily with breakfast. 30 tablet 03  . ipratropium (ATROVENT) 0.02 % nebulizer solution Take 2.5 mLs (0.5 mg total) by nebulization every 8 (eight) hours. 75 mL 12  . ipratropium-albuterol (DUONEB) 0.5-2.5 (3) MG/3ML SOLN USE ONE VIAL VIA NEBULIZER EVERY 6 HOURS AS NEEDED FOR SHORTNESS OF BREATH 360 mL 5  . isosorbide mononitrate (IMDUR) 60 MG 24 hr tablet Take 1 tablet (60 mg total) by mouth 2 (two) times daily. 180 tablet 0  . latanoprost (XALATAN) 0.005 % ophthalmic solution Place 1 drop into both eyes at bedtime.     Marland Kitchen LORazepam (ATIVAN) 0.5 MG tablet Take 1 tablet (0.5 mg total) by mouth 3 (three) times daily. 90 tablet 2  . metoprolol (LOPRESSOR) 100 MG tablet Take 1 tablet (100 mg total) by mouth 2 (two) times daily. Take an extra dose of metoprolol daily if needed for elevated blood pressure as instructed 90 tablet 11  .  mometasone-formoterol (DULERA) 100-5 MCG/ACT AERO inhale 2 puffs by mouth once daily (THEN RINSE MOUTH AFTER USE) 17.6 g 5  . montelukast (SINGULAIR) 10 MG tablet Take 1 tablet (10 mg total) by mouth at bedtime. 90 tablet 3  . nitroGLYCERIN (NITROSTAT) 0.4 MG SL tablet Place 1 tablet (0.4 mg total) under the tongue every 5 (five) minutes as needed for chest pain. 25 tablet 0  . NONFORMULARY OR COMPOUNDED ITEM Allergy Vaccine 1:10 Given at Home    . pantoprazole (PROTONIX) 40 MG tablet take 1 tablet by mouth twice a day (Patient taking differently: take 40 mg by mouth twice a day) 60 tablet 11  . pravastatin (PRAVACHOL) 80 MG tablet Take 1 tablet (80 mg total) by mouth every morning. 30 tablet 2  . Probiotic Product (PROBIOTIC DAILY) CAPS Take 1 capsule by mouth at bedtime.    Marland Kitchen RA VITAMIN B-12 TR 1000 MCG TBCR take 1 tablet by mouth once daily (Patient taking differently: take 1000 mcg by mouth once daily) 30 tablet 11  . torsemide (DEMADEX) 20 MG tablet Take 20 mg by mouth daily.    . traZODone (DESYREL) 50 MG tablet Take 0.5-1 tablets (25-50 mg total) by mouth at bedtime as needed for sleep. 30 tablet 3  . valsartan (DIOVAN) 320 MG tablet Take 1 tablet (320 mg total) by mouth daily. 90 tablet 3   No current facility-administered medications on file prior to visit.    ROS ROS otherwise unremarkable unless listed above.   Physical Examination: BP 182/100 mmHg  Pulse 88  Temp(Src) 97.5 F (36.4 C) (Oral)  Resp 18  Ht 5' (1.524 m)  SpO2 95% Ideal Body Weight: Weight in (lb) to have BMI = 25: 127.7  Physical Exam  Constitutional: He is oriented to person, place, and time. He appears well-developed and well-nourished. No distress.  HENT:  Head: Normocephalic and atraumatic.  Eyes: Conjunctivae and EOM are  normal. Pupils are equal, round, and reactive to light.  Cardiovascular: Normal rate and regular rhythm.  Exam reveals no gallop and no friction rub.   Murmur  heard. Pulmonary/Chest: Effort normal and breath sounds normal. No respiratory distress.  Neurological: He is alert and oriented to person, place, and time.  Skin: Skin is warm and dry. He is not diaphoretic.  Psychiatric: He has a normal mood and affect. His behavior is normal.     Assessment and Plan: [name]. is a 82 y.o. male who is here today for cc of  Elevated bp. This appears to be an ongoing effort.  I have advised that he restart his torsemide tomorrow morning.  Check bp three times per day.  If he develops alarming sxs , immediate medical seeking.   Will follow up with cardiologist for sooner visit.   Physical exam, and hx discussed and performed by Dr. Linna Darner.    Elevated blood pressure - Plan: EKG 12-Lead, POCT CBC, POCT urinalysis dipstick, Basic Metabolic Panel  Ivar Drape, PA-C [facility] 09/20/2015 6:45 PM

## 2015-09-20 NOTE — Patient Instructions (Addendum)
     IF you received an x-ray today, you will receive an invoice from [facility]. Please contact [facility] at [phone] with questions or concerns regarding your invoice.   IF you received labwork today, you will receive an invoice from [employer]. Please contact [facility] at [phone] with questions or concerns regarding your invoice.   Our billing staff will not be able to assist you with questions regarding bills from these companies.  You will be contacted with the lab results as soon as they are available. The fastest way to get your results is to activate your My Chart account. Instructions are located on the last page of this paperwork. If you have not heard from [place] regarding the results in 2 weeks, please contact this office.    Please take the torsemide first thing tomorrow morning.   Please check your blood pressure three times tomorrow. Please await contact for appointment with Hochrein.  i am going to try to get you in for follow up asap.

## 2015-09-21 ENCOUNTER — Telehealth: Payer: Self-pay

## 2015-09-21 ENCOUNTER — Telehealth: Payer: Self-pay | Admitting: Cardiology

## 2015-09-21 LAB — BASIC METABOLIC PANEL
BUN: 16 mg/dL (ref 7–25)
CALCIUM: 9.5 mg/dL (ref 8.6–10.3)
CO2: 26 mmol/L (ref 20–31)
Chloride: 97 mmol/L — ABNORMAL LOW (ref 98–110)
Creat: 0.89 mg/dL (ref 0.70–1.11)
GLUCOSE: 112 mg/dL — AB (ref 65–99)
POTASSIUM: 3.9 mmol/L (ref 3.5–5.3)
SODIUM: 132 mmol/L — AB (ref 135–146)

## 2015-09-21 MED ORDER — CARVEDILOL 25 MG PO TABS: 25.0000 mg | ORAL_TABLET | Freq: Two times a day (BID) | ORAL | Status: DC

## 2015-09-21 NOTE — Telephone Encounter (Signed)
New message      Pt c/o BP issue: STAT if pt c/o blurred vision, one-sided weakness or slurred speech  1. What are your last 5 BP readings?  214/194 last night.  Pt went to urgent care at [facility] last evening.  Now bp is 183/92 2. Are you having any other symptoms (ex. Dizziness, headache, blurred vision, passed out)?  headache 3. What is your BP issue? Pt went to urgent care last evening because of high bp.  Urgent care told pt they would call us to see what we suggested.  As on now, no one called pt so wife is calling to see what Dr Percival Spanish would like to do

## 2015-09-21 NOTE — Telephone Encounter (Signed)
Returned call to patient and spoke with wife, ok per DPR. She said pt went to urgent care last evening for BP elevated, urgent care has recordings of 198/94 and 182/100, HR 88. Pt c/o nausea, headache, chills, coughing, nose running, and lines running across left eye. Denies CP, palpitations or dizziness. Pt's wife said he took an extra dose of metoprolol last evening.  Urgent care doctor said to follow up with Dr Percival Spanish today. BP today at home with pt are 179/94, HR 96 and 172/88, HR 83.  Will confer with Dr Oval Linsey, [facility] concerning pt.

## 2015-09-21 NOTE — Telephone Encounter (Signed)
[name] wife  States patients blood pressure is still around 200 - wants to know if he should go to hospital, or if [name] set up a referral.  He is in bad shape.    Notified Jonelle Sidle  [phone]

## 2015-09-21 NOTE — Telephone Encounter (Signed)
Spoke with Dr Oval Linsey and gave findings as recorded. She recommended to stop metoprolol and change pt to carvedilol 25 mg by mouth twice a day. Then have pt scheduled to follow up in BP clinic.  Returned call to pt's wife. Gave her Dr Blenda Mounts med change to carvedilol. She verbalized understanding to stop metoprolol and begin carvedilol 25 mg twice a day. Appt scheduled with BP clinic for 09/28/15. Pt's wife verbalized understanding to go to the emergency room or call 911 if the pt develops new or worsening symptoms, such as CP, SOB, nausea, vomiting, or sweating and if BP goes above 180/100.

## 2015-09-22 ENCOUNTER — Telehealth: Payer: Self-pay | Admitting: Cardiology

## 2015-09-22 ENCOUNTER — Observation Stay (HOSPITAL_COMMUNITY)
Admission: EM | Admit: 2015-09-22 | Discharge: 2015-09-26 | Disposition: A | Discharge status: Home / Self Care | Payer: Medicare Other | Attending: Internal Medicine | Admitting: Internal Medicine

## 2015-09-22 ENCOUNTER — Encounter (HOSPITAL_COMMUNITY): Payer: Self-pay | Admitting: Physical Medicine and Rehabilitation

## 2015-09-22 ENCOUNTER — Emergency Department (HOSPITAL_COMMUNITY): Payer: Medicare Other

## 2015-09-22 DIAGNOSIS — R000 Tachycardia, unspecified: Secondary | ICD-10-CM | POA: Diagnosis present

## 2015-09-22 DIAGNOSIS — H409 Unspecified glaucoma: Secondary | ICD-10-CM | POA: Insufficient documentation

## 2015-09-22 DIAGNOSIS — Z79899 Other long term (current) drug therapy: Secondary | ICD-10-CM | POA: Diagnosis not present

## 2015-09-22 DIAGNOSIS — F329 Major depressive disorder, single episode, unspecified: Secondary | ICD-10-CM | POA: Diagnosis not present

## 2015-09-22 DIAGNOSIS — I2510 Atherosclerotic heart disease of native coronary artery without angina pectoris: Secondary | ICD-10-CM | POA: Diagnosis not present

## 2015-09-22 DIAGNOSIS — J4489 Other specified chronic obstructive pulmonary disease: Secondary | ICD-10-CM | POA: Diagnosis present

## 2015-09-22 DIAGNOSIS — J449 Chronic obstructive pulmonary disease, unspecified: Secondary | ICD-10-CM | POA: Diagnosis not present

## 2015-09-22 DIAGNOSIS — Z87891 Personal history of nicotine dependence: Secondary | ICD-10-CM | POA: Insufficient documentation

## 2015-09-22 DIAGNOSIS — J441 Chronic obstructive pulmonary disease with (acute) exacerbation: Secondary | ICD-10-CM | POA: Diagnosis present

## 2015-09-22 DIAGNOSIS — I480 Paroxysmal atrial fibrillation: Secondary | ICD-10-CM | POA: Diagnosis present

## 2015-09-22 DIAGNOSIS — I739 Peripheral vascular disease, unspecified: Secondary | ICD-10-CM | POA: Diagnosis present

## 2015-09-22 DIAGNOSIS — Z7901 Long term (current) use of anticoagulants: Secondary | ICD-10-CM | POA: Insufficient documentation

## 2015-09-22 DIAGNOSIS — Z951 Presence of aortocoronary bypass graft: Secondary | ICD-10-CM | POA: Insufficient documentation

## 2015-09-22 DIAGNOSIS — I509 Heart failure, unspecified: Secondary | ICD-10-CM | POA: Insufficient documentation

## 2015-09-22 DIAGNOSIS — M1990 Unspecified osteoarthritis, unspecified site: Secondary | ICD-10-CM | POA: Diagnosis not present

## 2015-09-22 DIAGNOSIS — R0789 Other chest pain: Secondary | ICD-10-CM | POA: Diagnosis not present

## 2015-09-22 DIAGNOSIS — I5032 Chronic diastolic (congestive) heart failure: Secondary | ICD-10-CM | POA: Diagnosis present

## 2015-09-22 DIAGNOSIS — G4733 Obstructive sleep apnea (adult) (pediatric): Secondary | ICD-10-CM | POA: Diagnosis present

## 2015-09-22 DIAGNOSIS — I10 Essential (primary) hypertension: Secondary | ICD-10-CM | POA: Diagnosis present

## 2015-09-22 DIAGNOSIS — I110 Hypertensive heart disease with heart failure: Secondary | ICD-10-CM | POA: Diagnosis not present

## 2015-09-22 DIAGNOSIS — R0602 Shortness of breath: Secondary | ICD-10-CM | POA: Diagnosis not present

## 2015-09-22 DIAGNOSIS — Z9861 Coronary angioplasty status: Secondary | ICD-10-CM

## 2015-09-22 DIAGNOSIS — E871 Hypo-osmolality and hyponatremia: Secondary | ICD-10-CM | POA: Diagnosis present

## 2015-09-22 DIAGNOSIS — R079 Chest pain, unspecified: Secondary | ICD-10-CM | POA: Diagnosis present

## 2015-09-22 DIAGNOSIS — R0600 Dyspnea, unspecified: Secondary | ICD-10-CM

## 2015-09-22 LAB — COMPREHENSIVE METABOLIC PANEL
ALK PHOS: 72 U/L (ref 38–126)
ALT: 21 U/L (ref 17–63)
AST: 37 U/L (ref 15–41)
Albumin: 3.6 g/dL (ref 3.5–5.0)
Anion gap: 12 (ref 5–15)
BILIRUBIN TOTAL: 1.4 mg/dL — AB (ref 0.3–1.2)
BUN: 22 mg/dL — AB (ref 6–20)
CHLORIDE: 94 mmol/L — AB (ref 101–111)
CO2: 25 mmol/L (ref 22–32)
CREATININE: 1.07 mg/dL (ref 0.61–1.24)
Calcium: 9.2 mg/dL (ref 8.9–10.3)
GFR calc Af Amer: >60 mL/min (ref >60)
Glucose, Bld: 122 mg/dL — ABNORMAL HIGH (ref 65–99)
Potassium: 3.6 mmol/L (ref 3.5–5.1)
Sodium: 131 mmol/L — ABNORMAL LOW (ref 135–145)
Total Protein: 6.1 g/dL — ABNORMAL LOW (ref 6.5–8.1)

## 2015-09-22 LAB — CBC WITH DIFFERENTIAL/PLATELET
Basophils Absolute: 0 10*3/uL (ref 0.0–0.1)
Basophils Relative: 0 %
EOS ABS: 0.3 10*3/uL (ref 0.0–0.7)
EOS PCT: 3 %
HCT: 39.4 % (ref 39.0–52.0)
Hemoglobin: 13.3 g/dL (ref 13.0–17.0)
LYMPHS ABS: 2.1 10*3/uL (ref 0.7–4.0)
Lymphocytes Relative: 27 %
MCH: 28.2 pg (ref 26.0–34.0)
MCHC: 33.8 g/dL (ref 30.0–36.0)
MCV: 83.5 fL (ref 78.0–100.0)
MONOS PCT: 10 %
Monocytes Absolute: 0.8 10*3/uL (ref 0.1–1.0)
Neutro Abs: 4.5 10*3/uL (ref 1.7–7.7)
Neutrophils Relative %: 60 %
PLATELETS: 181 10*3/uL (ref 150–400)
RBC: 4.72 MIL/uL (ref 4.22–5.81)
RDW: 14.2 % (ref 11.5–15.5)
WBC: 7.6 10*3/uL (ref 4.0–10.5)

## 2015-09-22 LAB — TROPONIN I: Troponin I: <0.03 ng/mL (ref <0.031)

## 2015-09-22 LAB — BRAIN NATRIURETIC PEPTIDE: B NATRIURETIC PEPTIDE 5: 69.2 pg/mL (ref 0.0–100.0)

## 2015-09-22 MED ORDER — ALBUTEROL SULFATE (2.5 MG/3ML) 0.083% IN NEBU
5.0000 mg | INHALATION_SOLUTION | Freq: Once | RESPIRATORY_TRACT | Status: AC
  Administered 2015-09-22: 5 mg via RESPIRATORY_TRACT
  Filled 2015-09-22: qty 6

## 2015-09-22 NOTE — ED Notes (Addendum)
Pt to department via [facility] from home. Reports issues with BP over the past several days. States he recently started taking Metoprolol. Pt is alert and oriented x4 upon arrival to ED. 20g L forearm. Denies pain.

## 2015-09-22 NOTE — Telephone Encounter (Signed)
Spoke to patient and wife.   She reports pt switched from metoprolol to carvedilol yesterday at instruction of Dr. Oval Linsey ([facility]). Still feeling poorly, having many of the same symptoms. His BP when checked was 174/94. HR has not gone below 100 today, was 112 most recently.  As discussed yesterday, they were advised to go to the ER if pt having any CP, palpitations, SOB, etc. Patient feels "worn out", "tired", and "feeling pressure or discomfort in chest". Pt feels like he needs to go to ED - I affirmed recommendation for ER evaluation.  Wife informs me they will call 911, I acknowledged.   Left Trish, Cardmaster, message regarding patient - noted to call if questions.

## 2015-09-22 NOTE — ED Provider Notes (Signed)
CSN: [id number]     Arrival date & time 09/22/15  1837 History   First MD Initiated Contact with Patient 09/22/15 1854     Chief Complaint  Patient presents with  . Hypertension     Patient is a 82 y.o. male presenting with hypertension. The history is provided by the patient.  Hypertension Associated symptoms include chest pain and shortness of breath. Pertinent negatives include no abdominal pain.  Patient presents with shortness of breath and high blood pressure. Blood pressures been running high for the last 4 days. Up to [id number] systolic. Medications been adjusted twice by primary care doctor's and his cardiology. States that he also has had some dull chest pain. States he has horrible lungs his been feeling worse. States they have switched from metoprolol to Coreg. No relief with certain annular home. Chest pain is dull. Is in his upper abdomen. Does not feel like his previous MI. Occasional cough with white sputum production. Family states that he had previously had an MI while he was in the waiting room.  Past Medical History  Diagnosis Date  . CORONARY HEART DISEASE     a. s/p CABG;  b. cath 06/27/10: S-Dx occluded, S-PDA 80-90% (tx with PCI); S-OM ok, L-LAD ok;  EF 65-70%  c.  s/p Promus DES to S-PDA 06/2010;   d. Cath 8 2016 LIMA to the LAD patent, SVG to posterior lateral subtotal, SVG to diagonal occluded chronically. The patient had stenting of the native right vessel.  Marland Kitchen FIBRILLATION, ATRIAL   . SLEEP APNEA 09/2001    NPSG AHI 22/HR  . ASTHMA   . Depression   . GERD     with HH, hx esophageal stricture  . DYSLIPIDEMIA   . HYPERTENSION     Echo 3/12: EF 55-60%; mod LVH; mild AS/AI; LAE; PASP 38; mild pulmo HTN  . Personal history of alcoholism ([place])   . Diverticulosis   . Benign liver cyst   . Skin cancer     L forearm  . Cataract     surgery to both eyes  . Esophageal stricture   . Hiatal hernia   . Pneumonia 04/2013 hosp  . Anxiety   . Arthritis   . CHF (congestive  heart failure) ([place])   . COPD (chronic obstructive pulmonary disease) ([place])   . Glaucoma   . Osteoporosis   . Subclavian arterial stenosis [facility])    Past Surgical History  Procedure Laterality Date  . Coronary artery bypass graft  02/2007  . Tonsillectomy    . Coronary angioplasty with stent placement  07/2010  . Cataract extraction, bilateral  2007  . Hernia repair  unsure ?60's  . Cholecystectomy  02/21/2011    Procedure: LAPAROSCOPIC CHOLECYSTECTOMY WITH INTRAOPERATIVE CHOLANGIOGRAM;  Surgeon: Pedro Earls, MD;  Location: [facility];  Service: General;  Laterality: N/A;  . Cardiac catheterization N/A 11/26/2014    Procedure: Left Heart Cath and Cors/Grafts Angiography;  Surgeon: Sherren Mocha, MD;  Location: [place] CV LAB;  Service: Cardiovascular;  Laterality: N/A;  . Cardiac catheterization N/A 11/26/2014    Procedure: Coronary Stent Intervention;  Surgeon: Sherren Mocha, MD;  Location: [place] CV LAB;  Service: Cardiovascular;  Laterality: N/A;  . Peripheral vascular catheterization N/A 02/22/2015    Procedure: Upper Extremity Angiography;  Surgeon: Lorretta Harp, MD;  Location: [place] CV LAB;  Service: Cardiovascular;  Laterality: N/A;   Family History  Problem Relation Age of Onset  . COPD Sister   .  Asthma Sister   . Emphysema Sister   . Hypertension Sister   . Hyperlipidemia Sister   . Hypertension Mother   . Heart disease Mother   . Hyperlipidemia Mother   . Colon cancer Neg Hx   . Heart disease Brother   . Heart disease Father   . Hyperlipidemia Father   . Hypertension Father    Social History  Substance Use Topics  . Smoking status: Former Smoker -- 3.00 packs/day for 40 years    Types: Cigarettes    Quit date: 04/11/1979  . Smokeless tobacco: Never Used  . Alcohol Use: No     Comment: quit drinking 40+ years    Review of Systems  Constitutional: Positive for appetite change.  Respiratory: Positive for shortness of breath.    Cardiovascular: Positive for chest pain.  Gastrointestinal: Negative for abdominal pain.  Genitourinary: Negative for flank pain.  Musculoskeletal: Negative for back pain.  Skin: Negative for wound.  Neurological: Negative for seizures.      Allergies  Ace inhibitors; Avelox; Tape; Lidocaine; Morphine and related; Codeine; Lasix; and Other  Home Medications   Prior to Admission medications   Medication Sig Start Date End Date Taking? Authorizing Provider  acetaminophen (TYLENOL) 325 MG tablet Take 650 mg by mouth every 6 (six) hours as needed for moderate pain or headache.    Yes Historical Provider, MD  albuterol (PROAIR HFA) 108 (90 Base) MCG/ACT inhaler Inhale 2 puffs into the lungs every 6 (six) hours as needed for wheezing or shortness of breath. 08/16/15  Yes Deneise Lever, MD  amLODipine (NORVASC) 2.5 MG tablet Take one tab daily in the morning and two tabs in the evening 09/08/15  Yes Binnie Rail, MD  apixaban (ELIQUIS) 5 MG TABS tablet Take 1 tablet (5 mg total) by mouth 2 (two) times daily. 05/20/15  Yes Minus Breeding, MD  calcium-vitamin D (OSCAL WITH D) 500-200 MG-UNIT per tablet Take 1 tablet by mouth 2 (two) times daily.   Yes Historical Provider, MD  carvedilol (COREG) 25 MG tablet Take 1 tablet (25 mg total) by mouth 2 (two) times daily. 09/21/15  Yes Skeet Latch, MD  cholestyramine (QUESTRAN) 4 g packet MIX AND DRINK 1 PACKET BY MOUTH TWICE A DAY IF NEEDED FOR LOOSE STOOLS 07/13/15  Yes Irene Shipper, MD  cloNIDine (CATAPRES) 0.3 MG tablet take 1 tablet by mouth twice a day 05/07/15  Yes Binnie Rail, MD  clopidogrel (PLAVIX) 75 MG tablet Take 1 tablet (75 mg total) by mouth daily with breakfast. 11/27/14  Yes Charlynne Cousins, MD  ipratropium (ATROVENT) 0.02 % nebulizer solution Take 2.5 mLs (0.5 mg total) by nebulization every 8 (eight) hours. 07/18/15  Yes Tereasa Coop, PA-C  ipratropium-albuterol (DUONEB) 0.5-2.5 (3) MG/3ML SOLN USE ONE VIAL VIA NEBULIZER EVERY 6  HOURS AS NEEDED FOR SHORTNESS OF BREATH 12/17/14  Yes Deneise Lever, MD  isosorbide mononitrate (IMDUR) 60 MG 24 hr tablet Take 1 tablet (60 mg total) by mouth 2 (two) times daily. 05/19/15  Yes Shanker Kristeen Mans, MD  latanoprost (XALATAN) 0.005 % ophthalmic solution Place 1 drop into both eyes at bedtime.    Yes Historical Provider, MD  LORazepam (ATIVAN) 0.5 MG tablet Take 1 tablet (0.5 mg total) by mouth 3 (three) times daily. 09/16/15  Yes Binnie Rail, MD  mometasone-formoterol (DULERA) 100-5 MCG/ACT AERO inhale 2 puffs by mouth once daily (THEN RINSE MOUTH AFTER USE) 08/24/15  Yes Deneise Lever, MD  montelukast (SINGULAIR) 10 MG tablet Take 1 tablet (10 mg total) by mouth at bedtime. 11/11/14  Yes Rowe Clack, MD  nitroGLYCERIN (NITROSTAT) 0.4 MG SL tablet Place 1 tablet (0.4 mg total) under the tongue every 5 (five) minutes as needed for chest pain. 07/13/14  Yes Minus Breeding, MD  pantoprazole (PROTONIX) 40 MG tablet take 1 tablet by mouth twice a day Patient taking differently: take 40 mg by mouth twice a day 01/01/15  Yes Minus Breeding, MD  pravastatin (PRAVACHOL) 80 MG tablet Take 1 tablet (80 mg total) by mouth every morning. 07/29/15  Yes Minus Breeding, MD  Probiotic Product (PROBIOTIC DAILY) CAPS Take 1 capsule by mouth at bedtime.   Yes Historical Provider, MD  RA VITAMIN B-12 TR 1000 MCG TBCR take 1 tablet by mouth once daily Patient taking differently: take 1000 mcg by mouth once daily 01/23/14  Yes Rowe Clack, MD  torsemide (DEMADEX) 20 MG tablet Take 20 mg by mouth daily.   Yes Historical Provider, MD  traZODone (DESYREL) 50 MG tablet Take 0.5-1 tablets (25-50 mg total) by mouth at bedtime as needed for sleep. 09/08/15  Yes Binnie Rail, MD  valsartan (DIOVAN) 320 MG tablet Take 1 tablet (320 mg total) by mouth daily. 08/10/15  Yes Binnie Rail, MD   BP 177/76 mmHg  Pulse 111  Temp(Src) 97.9 F (36.6 C) (Oral)  Resp 17  SpO2 97% Physical Exam  Constitutional: He  appears well-developed.  HENT:  Head: Atraumatic.  Neck: Neck supple.  Cardiovascular:  Mild tachycardia.  Pulmonary/Chest:  Diffuse wheezes, worse on the right.  Abdominal: Soft. There is no tenderness.  Musculoskeletal: He exhibits no edema or tenderness.  Neurological: He is alert.  Skin: Skin is warm.    ED Course  Procedures (including critical care time) Labs Review Labs Reviewed  COMPREHENSIVE METABOLIC PANEL - Abnormal; Notable for the following:    Sodium 131 (*)    Chloride 94 (*)    Glucose, Bld 122 (*)    BUN 22 (*)    Total Protein 6.1 (*)    Total Bilirubin 1.4 (*)    All other components within normal limits  CBC WITH DIFFERENTIAL/PLATELET  BRAIN NATRIURETIC PEPTIDE  TROPONIN I    Imaging Review Dg Chest 2 View  09/22/2015  CLINICAL DATA:  Shortness of breath, wheezing, tachycardia EXAM: CHEST  2 VIEW COMPARISON:  05/17/2015 FINDINGS: Cardiomediastinal silhouette is stable. Status post CABG. No acute infiltrate or pleural effusion. No pulmonary edema. Osteopenia and mild degenerative changes thoracic spine. IMPRESSION: No active cardiopulmonary disease. Electronically Signed   By: Lahoma Crocker M.D.   On: 09/22/2015 20:05   I have personally reviewed and evaluated these images and lab results as part of my medical decision-making.   EKG Interpretation   Date/Time:  Wednesday September 22 2015 19:06:18 EDT Ventricular Rate:  112 PR Interval:  170 QRS Duration: 102 QT Interval:  344 QTC Calculation: 469 R Axis:   51 Text Interpretation:  Sinus tachycardia Anterior infarct, old Confirmed by  Abrahan Fulmore  MD, Alano Blasco [phone]) on 09/22/2015 7:15:45 PM      MDM   Final diagnoses:  Essential hypertension  Chest pain, unspecified chest pain type    Patient with shortness of breath and chest pain. Medications been adjusted by primary care doctor. Continue tachycardia. Still feels somewhat short of breath after breathing treatment. Discussed possibly going home with  outpatient follow-up versus inpatient mission patient and family member would be much more  comfortable coming as an inpatient. They're very anxious about his high blood pressure and fast heart rate.     Davonna Belling, MD 09/22/15 2352

## 2015-09-22 NOTE — Telephone Encounter (Signed)
NewMEssage  Pt wife requested to speak w/ rN- stated that since med change from yesterday his BP went down "too low" and HR went up to 103. Pt wife requested to speak w/ RN- Please call back and discuss.

## 2015-09-22 NOTE — ED Notes (Signed)
Pt reports lower chest pressure x4 days as well.

## 2015-09-23 ENCOUNTER — Telehealth: Payer: Self-pay

## 2015-09-23 ENCOUNTER — Encounter (HOSPITAL_COMMUNITY): Payer: Self-pay | Admitting: Internal Medicine

## 2015-09-23 DIAGNOSIS — I10 Essential (primary) hypertension: Secondary | ICD-10-CM | POA: Diagnosis not present

## 2015-09-23 DIAGNOSIS — J441 Chronic obstructive pulmonary disease with (acute) exacerbation: Secondary | ICD-10-CM | POA: Diagnosis present

## 2015-09-23 DIAGNOSIS — I2510 Atherosclerotic heart disease of native coronary artery without angina pectoris: Secondary | ICD-10-CM | POA: Diagnosis not present

## 2015-09-23 DIAGNOSIS — I739 Peripheral vascular disease, unspecified: Secondary | ICD-10-CM

## 2015-09-23 DIAGNOSIS — E871 Hypo-osmolality and hyponatremia: Secondary | ICD-10-CM

## 2015-09-23 DIAGNOSIS — I480 Paroxysmal atrial fibrillation: Secondary | ICD-10-CM

## 2015-09-23 DIAGNOSIS — J449 Chronic obstructive pulmonary disease, unspecified: Secondary | ICD-10-CM | POA: Diagnosis not present

## 2015-09-23 DIAGNOSIS — I5032 Chronic diastolic (congestive) heart failure: Secondary | ICD-10-CM | POA: Diagnosis not present

## 2015-09-23 LAB — URINALYSIS, ROUTINE W REFLEX MICROSCOPIC
BILIRUBIN URINE: NEGATIVE
GLUCOSE, UA: NEGATIVE mg/dL
KETONES UR: NEGATIVE mg/dL
LEUKOCYTES UA: NEGATIVE
NITRITE: NEGATIVE
PROTEIN: NEGATIVE mg/dL
Specific Gravity, Urine: 1.013 (ref 1.005–1.030)
pH: 6 (ref 5.0–8.0)

## 2015-09-23 LAB — CBC
HCT: 38.9 % — ABNORMAL LOW (ref 39.0–52.0)
HEMATOCRIT: 52.7 % — AB (ref 39.0–52.0)
HEMOGLOBIN: 18.2 g/dL — AB (ref 13.0–17.0)
HEMOGLOBIN: 13 g/dL (ref 13.0–17.0)
MCH: 29.3 pg (ref 26.0–34.0)
MCH: 28.1 pg (ref 26.0–34.0)
MCHC: 34.5 g/dL (ref 30.0–36.0)
MCHC: 33.4 g/dL (ref 30.0–36.0)
MCV: 84.9 fL (ref 78.0–100.0)
MCV: 84.2 fL (ref 78.0–100.0)
PLATELETS: 185 10*3/uL (ref 150–400)
Platelets: 94 10*3/uL — ABNORMAL LOW (ref 150–400)
RBC: 6.21 MIL/uL — ABNORMAL HIGH (ref 4.22–5.81)
RBC: 4.62 MIL/uL (ref 4.22–5.81)
RDW: 14.5 % (ref 11.5–15.5)
RDW: 14.3 % (ref 11.5–15.5)
WBC: 5.2 10*3/uL (ref 4.0–10.5)
WBC: 6.2 10*3/uL (ref 4.0–10.5)

## 2015-09-23 LAB — COMPREHENSIVE METABOLIC PANEL
ALT: 19 U/L (ref 17–63)
ANION GAP: 10 (ref 5–15)
AST: 23 U/L (ref 15–41)
Albumin: 3.5 g/dL (ref 3.5–5.0)
Alkaline Phosphatase: 72 U/L (ref 38–126)
BILIRUBIN TOTAL: 1.1 mg/dL (ref 0.3–1.2)
BUN: 16 mg/dL (ref 6–20)
CO2: 28 mmol/L (ref 22–32)
Calcium: 9.1 mg/dL (ref 8.9–10.3)
Chloride: 97 mmol/L — ABNORMAL LOW (ref 101–111)
Creatinine, Ser: 0.99 mg/dL (ref 0.61–1.24)
GFR calc Af Amer: >60 mL/min (ref >60)
Glucose, Bld: 100 mg/dL — ABNORMAL HIGH (ref 65–99)
POTASSIUM: 3.2 mmol/L — AB (ref 3.5–5.1)
Sodium: 135 mmol/L (ref 135–145)
TOTAL PROTEIN: 6.3 g/dL — AB (ref 6.5–8.1)

## 2015-09-23 LAB — PHOSPHORUS: PHOSPHORUS: 3.6 mg/dL (ref 2.5–4.6)

## 2015-09-23 LAB — URINE MICROSCOPIC-ADD ON

## 2015-09-23 LAB — MAGNESIUM: MAGNESIUM: 2 mg/dL (ref 1.7–2.4)

## 2015-09-23 LAB — OSMOLALITY, URINE: OSMOLALITY UR: 344 mosm/kg (ref 300–900)

## 2015-09-23 LAB — TSH: TSH: 1.179 u[IU]/mL (ref 0.350–4.500)

## 2015-09-23 LAB — TROPONIN I
TROPONIN I: 0.03 ng/mL (ref <0.031)
Troponin I: <0.03 ng/mL (ref <0.031)

## 2015-09-23 LAB — CREATININE, URINE, RANDOM: Creatinine, Urine: 71.26 mg/dL

## 2015-09-23 LAB — D-DIMER, QUANTITATIVE (NOT AT ARMC): D DIMER QUANT: 0.3 ug{FEU}/mL (ref 0.00–0.50)

## 2015-09-23 LAB — SODIUM, URINE, RANDOM: Sodium, Ur: 25 mmol/L

## 2015-09-23 MED ORDER — IPRATROPIUM-ALBUTEROL 0.5-2.5 (3) MG/3ML IN SOLN: 3.0000 mL | Freq: Four times a day (QID) | RESPIRATORY_TRACT | Status: DC | PRN

## 2015-09-23 MED ORDER — MONTELUKAST SODIUM 10 MG PO TABS
10.0000 mg | ORAL_TABLET | Freq: Every day | ORAL | Status: DC
  Administered 2015-09-23 – 2015-09-25 (×3): 10 mg via ORAL
  Filled 2015-09-23 (×3): qty 1

## 2015-09-23 MED ORDER — IRBESARTAN 150 MG PO TABS
300.0000 mg | ORAL_TABLET | Freq: Every day | ORAL | Status: DC
  Administered 2015-09-24 – 2015-09-26 (×3): 300 mg via ORAL
  Filled 2015-09-23 (×3): qty 2

## 2015-09-23 MED ORDER — PREDNISONE 20 MG PO TABS
40.0000 mg | ORAL_TABLET | Freq: Two times a day (BID) | ORAL | Status: DC
  Administered 2015-09-23 – 2015-09-24 (×4): 40 mg via ORAL
  Filled 2015-09-23 (×4): qty 2

## 2015-09-23 MED ORDER — ACETAMINOPHEN 650 MG RE SUPP: 650.0000 mg | Freq: Four times a day (QID) | RECTAL | Status: DC | PRN

## 2015-09-23 MED ORDER — ACETAMINOPHEN 325 MG PO TABS: 650.0000 mg | ORAL_TABLET | Freq: Four times a day (QID) | ORAL | Status: DC | PRN

## 2015-09-23 MED ORDER — IPRATROPIUM BROMIDE 0.02 % IN SOLN
0.5000 mg | Freq: Three times a day (TID) | RESPIRATORY_TRACT | Status: DC
  Administered 2015-09-23 – 2015-09-24 (×5): 0.5 mg via RESPIRATORY_TRACT
  Filled 2015-09-23 (×6): qty 2.5

## 2015-09-23 MED ORDER — SODIUM CHLORIDE 0.9 % IV SOLN
INTRAVENOUS | Status: DC
  Administered 2015-09-23: 12:00:00 via INTRAVENOUS

## 2015-09-23 MED ORDER — TRAZODONE HCL 50 MG PO TABS: 25.0000 mg | ORAL_TABLET | Freq: Every evening | ORAL | Status: DC | PRN

## 2015-09-23 MED ORDER — HYDROCODONE-ACETAMINOPHEN 5-325 MG PO TABS: 1.0000 | ORAL_TABLET | ORAL | Status: DC | PRN

## 2015-09-23 MED ORDER — DOXYCYCLINE HYCLATE 100 MG PO TABS
100.0000 mg | ORAL_TABLET | Freq: Two times a day (BID) | ORAL | Status: DC
  Administered 2015-09-23 – 2015-09-26 (×6): 100 mg via ORAL
  Filled 2015-09-23 (×6): qty 1

## 2015-09-23 MED ORDER — NITROGLYCERIN 0.4 MG SL SUBL: 0.4000 mg | SUBLINGUAL_TABLET | SUBLINGUAL | Status: DC | PRN

## 2015-09-23 MED ORDER — PRAVASTATIN SODIUM 40 MG PO TABS
80.0000 mg | ORAL_TABLET | Freq: Every morning | ORAL | Status: DC
  Administered 2015-09-23 – 2015-09-26 (×4): 80 mg via ORAL
  Filled 2015-09-23 (×4): qty 2

## 2015-09-23 MED ORDER — LEVALBUTEROL HCL 1.25 MG/0.5ML IN NEBU
1.2500 mg | INHALATION_SOLUTION | Freq: Once | RESPIRATORY_TRACT | Status: AC
  Administered 2015-09-23: 1.25 mg via RESPIRATORY_TRACT
  Filled 2015-09-23: qty 0.5

## 2015-09-23 MED ORDER — GUAIFENESIN ER 600 MG PO TB12
600.0000 mg | ORAL_TABLET | Freq: Two times a day (BID) | ORAL | Status: DC
  Administered 2015-09-23 – 2015-09-26 (×7): 600 mg via ORAL
  Filled 2015-09-23 (×7): qty 1

## 2015-09-23 MED ORDER — AMLODIPINE BESYLATE 5 MG PO TABS
2.5000 mg | ORAL_TABLET | Freq: Every day | ORAL | Status: DC
  Administered 2015-09-23: 2.5 mg via ORAL
  Filled 2015-09-23: qty 1

## 2015-09-23 MED ORDER — ISOSORBIDE MONONITRATE ER 60 MG PO TB24
60.0000 mg | ORAL_TABLET | Freq: Two times a day (BID) | ORAL | Status: DC
  Administered 2015-09-23 – 2015-09-26 (×7): 60 mg via ORAL
  Filled 2015-09-23 (×7): qty 1

## 2015-09-23 MED ORDER — SPIRONOLACTONE 25 MG PO TABS
25.0000 mg | ORAL_TABLET | Freq: Every day | ORAL | Status: DC
  Administered 2015-09-24 – 2015-09-25 (×2): 25 mg via ORAL
  Filled 2015-09-23: qty 1

## 2015-09-23 MED ORDER — ONDANSETRON HCL 4 MG PO TABS: 4.0000 mg | ORAL_TABLET | Freq: Four times a day (QID) | ORAL | Status: DC | PRN

## 2015-09-23 MED ORDER — AMLODIPINE BESYLATE 5 MG PO TABS: 10.0000 mg | ORAL_TABLET | Freq: Every day | ORAL | Status: DC

## 2015-09-23 MED ORDER — MOMETASONE FURO-FORMOTEROL FUM 100-5 MCG/ACT IN AERO
2.0000 | INHALATION_SPRAY | Freq: Two times a day (BID) | RESPIRATORY_TRACT | Status: DC
  Filled 2015-09-23: qty 8.8

## 2015-09-23 MED ORDER — IPRATROPIUM-ALBUTEROL 0.5-2.5 (3) MG/3ML IN SOLN
3.0000 mL | Freq: Four times a day (QID) | RESPIRATORY_TRACT | Status: DC
  Administered 2015-09-23 (×2): 3 mL via RESPIRATORY_TRACT
  Filled 2015-09-23 (×2): qty 3

## 2015-09-23 MED ORDER — LEVALBUTEROL HCL 1.25 MG/0.5ML IN NEBU
1.2500 mg | INHALATION_SOLUTION | RESPIRATORY_TRACT | Status: DC | PRN
  Filled 2015-09-23: qty 0.5

## 2015-09-23 MED ORDER — LORAZEPAM 0.5 MG PO TABS
0.5000 mg | ORAL_TABLET | Freq: Three times a day (TID) | ORAL | Status: DC
  Administered 2015-09-23 – 2015-09-26 (×10): 0.5 mg via ORAL
  Filled 2015-09-23 (×10): qty 1

## 2015-09-23 MED ORDER — LATANOPROST 0.005 % OP SOLN
1.0000 [drp] | Freq: Every day | OPHTHALMIC | Status: DC
  Administered 2015-09-23 – 2015-09-25 (×3): 1 [drp] via OPHTHALMIC
  Filled 2015-09-23 (×2): qty 2.5

## 2015-09-23 MED ORDER — DOXYCYCLINE HYCLATE 100 MG IV SOLR
100.0000 mg | Freq: Two times a day (BID) | INTRAVENOUS | Status: DC
  Administered 2015-09-23: 100 mg via INTRAVENOUS
  Filled 2015-09-23 (×3): qty 100

## 2015-09-23 MED ORDER — PANTOPRAZOLE SODIUM 40 MG PO TBEC
40.0000 mg | DELAYED_RELEASE_TABLET | Freq: Two times a day (BID) | ORAL | Status: DC
  Administered 2015-09-23 – 2015-09-26 (×7): 40 mg via ORAL
  Filled 2015-09-23 (×7): qty 1

## 2015-09-23 MED ORDER — CLOPIDOGREL BISULFATE 75 MG PO TABS
75.0000 mg | ORAL_TABLET | Freq: Every day | ORAL | Status: DC
  Administered 2015-09-23 – 2015-09-26 (×4): 75 mg via ORAL
  Filled 2015-09-23 (×4): qty 1

## 2015-09-23 MED ORDER — ARFORMOTEROL TARTRATE 15 MCG/2ML IN NEBU
15.0000 ug | INHALATION_SOLUTION | Freq: Two times a day (BID) | RESPIRATORY_TRACT | Status: DC
  Administered 2015-09-23 – 2015-09-26 (×6): 15 ug via RESPIRATORY_TRACT
  Filled 2015-09-23 (×6): qty 2

## 2015-09-23 MED ORDER — APIXABAN 5 MG PO TABS
5.0000 mg | ORAL_TABLET | Freq: Two times a day (BID) | ORAL | Status: DC
  Administered 2015-09-23 – 2015-09-26 (×7): 5 mg via ORAL
  Filled 2015-09-23 (×7): qty 1

## 2015-09-23 MED ORDER — CLONIDINE HCL 0.1 MG PO TABS: 0.1000 mg | ORAL_TABLET | Freq: Two times a day (BID) | ORAL | Status: DC

## 2015-09-23 MED ORDER — BUDESONIDE 0.5 MG/2ML IN SUSP
0.5000 mg | Freq: Two times a day (BID) | RESPIRATORY_TRACT | Status: DC
  Administered 2015-09-23 – 2015-09-26 (×6): 0.5 mg via RESPIRATORY_TRACT
  Filled 2015-09-23 (×6): qty 2

## 2015-09-23 MED ORDER — CARVEDILOL 25 MG PO TABS
25.0000 mg | ORAL_TABLET | Freq: Two times a day (BID) | ORAL | Status: DC
Start: 2015-09-23 — End: 2015-09-23
  Administered 2015-09-23: 25 mg via ORAL
  Filled 2015-09-23: qty 2

## 2015-09-23 MED ORDER — CLONIDINE HCL 0.2 MG PO TABS
0.3000 mg | ORAL_TABLET | Freq: Two times a day (BID) | ORAL | Status: DC
  Administered 2015-09-23: 0.3 mg via ORAL
  Filled 2015-09-23: qty 1

## 2015-09-23 MED ORDER — SODIUM CHLORIDE 0.9% FLUSH
3.0000 mL | Freq: Two times a day (BID) | INTRAVENOUS | Status: DC
  Administered 2015-09-23 – 2015-09-26 (×6): 3 mL via INTRAVENOUS

## 2015-09-23 MED ORDER — ONDANSETRON HCL 4 MG/2ML IJ SOLN
4.0000 mg | Freq: Four times a day (QID) | INTRAMUSCULAR | Status: DC | PRN
  Administered 2015-09-23: 4 mg via INTRAVENOUS
  Filled 2015-09-23: qty 2

## 2015-09-23 MED ORDER — BISOPROLOL FUMARATE 5 MG PO TABS: 20.0000 mg | ORAL_TABLET | Freq: Every day | ORAL | Status: DC

## 2015-09-23 NOTE — Progress Notes (Signed)
PROGRESS NOTE  Stephen Door.  [id number] DOB: 05-Jan-1931 DOA: 09/22/2015 PCP: Binnie Rail, MD  Brief Narrative:   Stephen Door. is a 82 y.o. male with medical history significant of CAD status post CABG, OSA, HTN, PVD,Subclavian stenosis, sinus post PCI recurrent hyponatremia intermittent tachycardia.  Presented with 2 week hx of elevated blood pressure and HR worse over past few days. He has been wheezing and more Jamorris Ndiaye of breath.  Family checks blood pressure on a regular basis and adjusts medications frequently. Patient's wife called cardiology and was told to stop the Toprol and change to carvedilol 25 twice a day.  Has had labile blood pressures, both high and low.  IN ER: Afebrile heart rate initially up to 123 respirations 18 blood pressure 141/82 Patient was given albuterol secondary to wheezing WBC 10.4 hemoglobin 14.3 sodium down to 131 creatinine 1.07 troponin unremarkable BNP within normal limits EKG nonacute chest x-ray show no acute findings  Assessment & Plan:   Active Problems:   Asthma with COPD ([place])   Obstructive sleep apnea   Chronic diastolic heart failure ([facility])   PAF (paroxysmal atrial fibrillation) ([facility])   Accelerated hypertension   CAD S/P S-PDA DES 2012   Tachycardia   Chest pain   PAD (peripheral artery disease) ([facility])   Hyponatremia   Essential hypertension   COPD (chronic obstructive pulmonary disease) ([facility])   COPD exacerbation ([facility])  Labile blood pressures, I suspect this is due to his clonidine.  After his morning clonidine today, his blood pressure dropped to the 0000000 systolic.  Also, I suspect his increased wheeze may be related to changing to a less selective beta blocker.  -  Stop clonidine -  Stop carvedilol -  Increase norvasc -  Start bisoprolol -  Start spironolactone -  Resume ARB -  Will avoid hydralazine due to tachycardia.    Sinus tachycardia -  TSH wnl -  D-dimer, negative -  Will try to minimize beta  agonists/anticholinergics (xopenex) -  May have increased tachycardia after stopping clonidine  Dyspnea likely due to acute COPD exacerbation -  Agree with steroids -  Continue doxycycline -  Change to budesonide and brovana until breathing more comfortably -  Continue atrovent and xopenex nebs -  Continue BID PPI  Paroxysmal atrial fibrillation, CHADs2vasc = 4   -  Sinus tachycardia on telemetry -  Continue eliquis -  Change to bisoprolol  OSA, offer CPAP  Hyponatremia, resolved with IVF  CAD with episode of chest pain this morning.  -  ECG unchanged -  troponins negative -  Suspect chest tightness is related to copd -  No arrhythmias on telemetry at time of event  PAD, stable, continue eliquis  Chronic diastolic heart failure -  Monitor for signs of hypervolemia  Anxiety -  Continue scheduled ativan   DVT prophylaxis:  eliquis Code Status:  Full Family Communication:  Patient and wife at bedside Disposition Plan:  Pending blood pressures less labile and better controlled.  Family should not have to check blood pressures so often during the day.  Ongoing treatment for COPD.     Consultants:   none  Procedures:  ECHO pending  Antimicrobials:   Doxycycline     Subjective: Had chest pain associated with blacking out of his vision this morning, now resolved.  Did not take any NTG.  BP dropped dramatically after receiving clonidine this morning.  Cough productive of thick sputum with chest tightness and SOB  Objective: Filed Vitals:   09/23/15 1250 09/23/15 1300 09/23/15 1345 09/23/15 1459  BP: 84/53 122/64 100/51   Pulse: 79 86 88   Temp:   98.6 F (37 C)   TempSrc:   Oral   Resp: 20 12 16    Height:   5\' 2"  (1.575 m)   Weight:   63.866 kg (140 lb 12.8 oz)   SpO2: 93% 95% 93% 93%   No intake or output data in the 24 hours ending 09/23/15 1753 Filed Weights   09/23/15 1345  Weight: 63.866 kg (140 lb 12.8 oz)    Examination:  General exam:  Adult  male.  No acute distress.  HEENT:  NCAT, MMM Respiratory system:  Diminished bilateral breath sounds with high pitched wheeze and prolonged exp phase Cardiovascular system: Regular rate and rhythm, normal S1/S2. No murmurs, rubs, gallops or clicks.  Warm extremities Gastrointestinal system: Normal active bowel sounds, soft, nondistended, nontender. MSK:  Normal tone and bulk, no lower extremity edema Neuro:  Grossly intact    Data Reviewed: I have personally reviewed following labs and imaging studies  CBC:  Recent Labs Lab 09/20/15 1945 09/22/15 1933 09/23/15 0540 09/23/15 1140  WBC 10.4* 7.6 5.2 6.2  NEUTROABS  --  4.5  --   --   HGB 15.6 13.3 18.2* 13.0  HCT 42.8* 39.4 52.7* 38.9*  MCV 83.6 83.5 84.9 84.2  PLT  --  181 94* [id number]   Basic Metabolic Panel:  Recent Labs Lab 09/20/15 1955 09/22/15 1933 09/23/15 0540  NA 132* 131* 135  K 3.9 3.6 3.2*  CL 97* 94* 97*  CO2 26 25 28   GLUCOSE 112* 122* 100*  BUN 16 22* 16  CREATININE 0.89 1.07 0.99  CALCIUM 9.5 9.2 9.1  MG  --   --  2.0  PHOS  --   --  3.6   GFR: Estimated Creatinine Clearance: 44.4 mL/min (by C-G formula based on Cr of 0.99). Liver Function Tests:  Recent Labs Lab 09/22/15 1933 09/23/15 0540  AST 37 23  ALT 21 19  ALKPHOS 72 72  BILITOT 1.4* 1.1  PROT 6.1* 6.3*  ALBUMIN 3.6 3.5   No results for input(s): LIPASE, AMYLASE in the last 168 hours. No results for input(s): AMMONIA in the last 168 hours. Coagulation Profile: No results for input(s): INR, PROTIME in the last 168 hours. Cardiac Enzymes:  Recent Labs Lab 09/22/15 1933 09/23/15 0540 09/23/15 1140 09/23/15 1625  TROPONINI <0.03 <0.03 <0.03 0.03   BNP (last 3 results) No results for input(s): PROBNP in the last 8760 hours. HbA1C: No results for input(s): HGBA1C in the last 72 hours. CBG: No results for input(s): GLUCAP in the last 168 hours. Lipid Profile: No results for input(s): CHOL, HDL, LDLCALC, TRIG, CHOLHDL,  LDLDIRECT in the last 72 hours. Thyroid Function Tests:  Recent Labs  09/23/15 0540  TSH 1.179   Anemia Panel: No results for input(s): VITAMINB12, FOLATE, FERRITIN, TIBC, IRON, RETICCTPCT in the last 72 hours. Urine analysis:    Component Value Date/Time   COLORURINE YELLOW 09/23/2015 0605   APPEARANCEUR CLEAR 09/23/2015 0605   LABSPEC 1.013 09/23/2015 0605   PHURINE 6.0 09/23/2015 0605   GLUCOSEU NEGATIVE 09/23/2015 0605   GLUCOSEU NEGATIVE 06/19/2014 0941   HGBUR MODERATE* 09/23/2015 0605   BILIRUBINUR NEGATIVE 09/23/2015 0605   BILIRUBINUR negative 09/20/2015 1946   KETONESUR NEGATIVE 09/23/2015 0605   KETONESUR negative 09/20/2015 1946   PROTEINUR NEGATIVE 09/23/2015 0605   PROTEINUR =100* 09/20/2015 1946  UROBILINOGEN 0.2 09/20/2015 1946   UROBILINOGEN 0.2 09/17/2014 2215   NITRITE NEGATIVE 09/23/2015 0605   NITRITE Negative 09/20/2015 1946   LEUKOCYTESUR NEGATIVE 09/23/2015 0605   Sepsis Labs: @LABRCNTIP (procalcitonin:4,lacticidven:4)  )No results found for this or any previous visit (from the past 240 hour(s)).    Radiology Studies: Dg Chest 2 View  09/22/2015  CLINICAL DATA:  Shortness of breath, wheezing, tachycardia EXAM: CHEST  2 VIEW COMPARISON:  05/17/2015 FINDINGS: Cardiomediastinal silhouette is stable. Status post CABG. No acute infiltrate or pleural effusion. No pulmonary edema. Osteopenia and mild degenerative changes thoracic spine. IMPRESSION: No active cardiopulmonary disease. Electronically Signed   By: Lahoma Crocker M.D.   On: 09/22/2015 20:05     Scheduled Meds: . [START ON 09/24/2015] amLODipine  10 mg Oral Daily  . apixaban  5 mg Oral BID  . carvedilol  25 mg Oral BID  . clopidogrel  75 mg Oral Q breakfast  . doxycycline (VIBRAMYCIN) IV  100 mg Intravenous Q12H  . guaiFENesin  600 mg Oral BID  . ipratropium  0.5 mg Nebulization Q8H  . ipratropium-albuterol  3 mL Nebulization Q6H  . isosorbide mononitrate  60 mg Oral BID  . latanoprost  1  drop Both Eyes QHS  . LORazepam  0.5 mg Oral TID  . mometasone-formoterol  2 puff Inhalation BID  . montelukast  10 mg Oral QHS  . pantoprazole  40 mg Oral BID  . pravastatin  80 mg Oral q morning - 10a  . predniSONE  40 mg Oral BID  . sodium chloride flush  3 mL Intravenous Q12H   Continuous Infusions:    LOS: 0 days    Time spent: 30 min    Janece Canterbury, MD [facility] Pager [phone]  If 7PM-7AM, please contact night-coverage [email or web address] Password [facility] 09/23/2015, 5:53 PM

## 2015-09-23 NOTE — H&P (Signed)
[name]. [id number] DOB: 10-Aug-1930 DOA: 09/22/2015     PCP: Binnie Rail, MD   Outpatient Specialists: Cardiology [facility] Patient coming from:    home Lives  With family    Chief Complaint: elevated blood pressure and heart rate  HPI: [name]. is a 82 y.o. male with medical history significant of CAD status post CABG, OSA, HTN, PVD,   Subclavian stenosis, sinus post PCI recurrent hyponatremia intermittent tachycardia  Presented with 2 week hx of elevated blood pressure and HR worse over past few days. He has been wheezing more at night. He has been more short of breath. No leg swelling he has been using his stockings. He has been eating and drinking ok. Reports drinking a large amount of water. He has had trouble with hypertension and elevated heart rate for the past few weeks. Had shaking chills this morning wife checked blood pressure was 198/94 this was not associated chest pain dizziness or lightheadedness. Patient was given metoprolol by his wife at that time. Family checks blood pressure on a regular basis and adjusts medications frequently. Patient's wife called cardiology and was told to stop the Toprol and change to current medial 25 twice a day patient's wife gave a dose and   noted that the blood pressure dropped she was afraid to give him anymore and start to every hour 12.5 twice a day this point his heart rate went up He has had epigastric discomfort for the past 1 week, intermittent cough, runny nose. No true chest pain. He has not needed to take nitro.    Regarding pertinent Chronic problems: Known history of coronary artery disease status post CABG cardiac catheterization 2012 showed obstructive coronary artery disease treated with PCI status post DES to PDA had repeat cardiac catheterization 2016 showing subtotal occlusion of SVG to posterior lateral subtotal. His history of asthma family states does not true to have COPD. Has  history of hypertension echogram in 2012 showed moderate LVH and mild point hypertension She has history of anxiety and has been taken regularly lorazepam Of note patient had history of tachycardia and past which has spontaneously cut down by itself.  IN ER: Afebrile heart rate initially up to 123 respirations 18 blood pressure 141/82 Patient was given albuterol secondary to wheezing WBC 10.4 hemoglobin 14.3 sodium down to 131 creatinine 1.07 troponin unremarkable BNP within normal limits  EKG nonacute Yes x-ray show no acute findings Hospitalist was called for admission for asthma/COPD exacerbation and tachycardia  Review of Systems:    Pertinent positives include:  chills,.abdominal pain, nausea, shortness of breath at rest dyspnea on exertion,  excess mucus, non-productive cough wheezing. Fatigue, nasal congestion, post nasal drip,  Constitutional:  No weight loss, night sweats, Fevers,  weight loss  HEENT:  No headaches, Difficulty swallowing,Tooth/dental problems,Sore throat,  No sneezing, itching, ear ache,  Cardio-vascular:  No chest pain, Orthopnea, PND, anasarca, dizziness, palpitations.no Bilateral lower extremity swelling  GI:  No heartburn, indigestion,  vomiting, diarrhea, change in bowel habits, loss of appetite, melena, blood in stool, hematemesis Resp:   no productive cough, No, No coughing up of blood.No change in color of mucus.No  Skin:  no rash or lesions. No jaundice GU:  no dysuria, change in color of urine, no urgency or frequency. No straining to urinate.  No flank pain.  Musculoskeletal:  No joint pain or no joint swelling. No decreased range of motion. No back pain.  Psych:  No  change in mood or affect. No depression or anxiety. No memory loss.  Neuro: no localizing neurological complaints, no tingling, no weakness, no double vision, no gait abnormality, no slurred speech, no confusion  As per HPI otherwise 10 point review of systems negative.   Past  Medical History: Past Medical History  Diagnosis Date  . CORONARY HEART DISEASE     a. s/p CABG;  b. cath 06/27/10: S-Dx occluded, S-PDA 80-90% (tx with PCI); S-OM ok, L-LAD ok;  EF 65-70%  c.  s/p Promus DES to S-PDA 06/2010;   d. Cath 8 2016 LIMA to the LAD patent, SVG to posterior lateral subtotal, SVG to diagonal occluded chronically. The patient had stenting of the native right vessel.  Marland Kitchen FIBRILLATION, ATRIAL   . SLEEP APNEA 09/2001    NPSG AHI 22/HR  . ASTHMA   . Depression   . GERD     with HH, hx esophageal stricture  . DYSLIPIDEMIA   . HYPERTENSION     Echo 3/12: EF 55-60%; mod LVH; mild AS/AI; LAE; PASP 38; mild pulmo HTN  . Personal history of alcoholism ([place])   . Diverticulosis   . Benign liver cyst   . Skin cancer     L forearm  . Cataract     surgery to both eyes  . Esophageal stricture   . Hiatal hernia   . Pneumonia 04/2013 hosp  . Anxiety   . Arthritis   . CHF (congestive heart failure) ([place])   . COPD (chronic obstructive pulmonary disease) ([place])   . Glaucoma   . Osteoporosis   . Subclavian arterial stenosis [facility])    Past Surgical History  Procedure Laterality Date  . Coronary artery bypass graft  02/2007  . Tonsillectomy    . Coronary angioplasty with stent placement  07/2010  . Cataract extraction, bilateral  2007  . Hernia repair  unsure ?60's  . Cholecystectomy  02/21/2011    Procedure: LAPAROSCOPIC CHOLECYSTECTOMY WITH INTRAOPERATIVE CHOLANGIOGRAM;  Surgeon: Pedro Earls, MD;  Location: [facility] ORS;  Service: General;  Laterality: N/A;  . Cardiac catheterization N/A 11/26/2014    Procedure: Left Heart Cath and Cors/Grafts Angiography;  Surgeon: Sherren Mocha, MD;  Location: [place] [facility];  Service: Cardiovascular;  Laterality: N/A;  . Cardiac catheterization N/A 11/26/2014    Procedure: Coronary Stent Intervention;  Surgeon: Sherren Mocha, MD;  Location: [place] [facility];  Service: Cardiovascular;  Laterality: N/A;  . Peripheral vascular  catheterization N/A 02/22/2015    Procedure: Upper Extremity Angiography;  Surgeon: Lorretta Harp, MD;  Location: [place] [facility];  Service: Cardiovascular;  Laterality: N/A;     Social History:  Ambulatory  Independently     reports that he quit smoking about 36 years ago. His smoking use included Cigarettes. He has a 120 pack-year smoking history. He has never used smokeless tobacco. He reports that he does not drink alcohol or use illicit drugs.  Allergies:   Allergies  Allergen Reactions  . Ace Inhibitors Other (See Comments)    Severe asthma (COPD)  . Avelox [Moxifloxacin Hcl In Nacl] Other (See Comments)    Gum pain  . Tape Other (See Comments)    Adhesive and tape reaction-tears skin off   . Lidocaine Other (See Comments)    Possible reaction? Dizziness, confusion, syncope  . Morphine And Related Nausea And Vomiting  . Codeine Nausea And Vomiting  . Lasix [Furosemide] Itching and Swelling  . Other Other (See Comments)  Maple trees, allergy symptoms       Family History:    Family History  Problem Relation Age of Onset  . COPD Sister   . Asthma Sister   . Emphysema Sister   . Hypertension Sister   . Hyperlipidemia Sister   . Hypertension Mother   . Heart disease Mother   . Hyperlipidemia Mother   . Colon cancer Neg Hx   . Heart disease Brother   . Heart disease Father   . Hyperlipidemia Father   . Hypertension Father     Medications: Prior to Admission medications   Medication Sig Start Date End Date Taking? Authorizing Provider  acetaminophen (TYLENOL) 325 MG tablet Take 650 mg by mouth every 6 (six) hours as needed for moderate pain or headache.    Yes Historical Provider, MD  albuterol (PROAIR HFA) 108 (90 Base) MCG/ACT inhaler Inhale 2 puffs into the lungs every 6 (six) hours as needed for wheezing or shortness of breath. 08/16/15  Yes Deneise Lever, MD  amLODipine (NORVASC) 2.5 MG tablet Take one tab daily in the morning and two tabs in the  evening 09/08/15  Yes Binnie Rail, MD  apixaban (ELIQUIS) 5 MG TABS tablet Take 1 tablet (5 mg total) by mouth 2 (two) times daily. 05/20/15  Yes Minus Breeding, MD  calcium-vitamin D (OSCAL WITH D) 500-200 MG-UNIT per tablet Take 1 tablet by mouth 2 (two) times daily.   Yes Historical Provider, MD  carvedilol (COREG) 25 MG tablet Take 1 tablet (25 mg total) by mouth 2 (two) times daily. 09/21/15  Yes Skeet Latch, MD  cholestyramine (QUESTRAN) 4 g packet MIX AND DRINK 1 PACKET BY MOUTH TWICE A DAY IF NEEDED FOR LOOSE STOOLS 07/13/15  Yes Irene Shipper, MD  cloNIDine (CATAPRES) 0.3 MG tablet take 1 tablet by mouth twice a day 05/07/15  Yes Binnie Rail, MD  clopidogrel (PLAVIX) 75 MG tablet Take 1 tablet (75 mg total) by mouth daily with breakfast. 11/27/14  Yes Charlynne Cousins, MD  ipratropium (ATROVENT) 0.02 % nebulizer solution Take 2.5 mLs (0.5 mg total) by nebulization every 8 (eight) hours. 07/18/15  Yes Tereasa Coop, PA-C  ipratropium-albuterol (DUONEB) 0.5-2.5 (3) MG/3ML SOLN USE ONE VIAL VIA NEBULIZER EVERY 6 HOURS AS NEEDED FOR SHORTNESS OF BREATH 12/17/14  Yes Deneise Lever, MD  isosorbide mononitrate (IMDUR) 60 MG 24 hr tablet Take 1 tablet (60 mg total) by mouth 2 (two) times daily. 05/19/15  Yes Shanker Kristeen Mans, MD  latanoprost (XALATAN) 0.005 % ophthalmic solution Place 1 drop into both eyes at bedtime.    Yes Historical Provider, MD  LORazepam (ATIVAN) 0.5 MG tablet Take 1 tablet (0.5 mg total) by mouth 3 (three) times daily. 09/16/15  Yes Binnie Rail, MD  mometasone-formoterol (DULERA) 100-5 MCG/ACT AERO inhale 2 puffs by mouth once daily (THEN RINSE MOUTH AFTER USE) 08/24/15  Yes Deneise Lever, MD  montelukast (SINGULAIR) 10 MG tablet Take 1 tablet (10 mg total) by mouth at bedtime. 11/11/14  Yes Rowe Clack, MD  nitroGLYCERIN (NITROSTAT) 0.4 MG SL tablet Place 1 tablet (0.4 mg total) under the tongue every 5 (five) minutes as needed for chest pain. 07/13/14  Yes Minus Breeding, MD  pantoprazole (PROTONIX) 40 MG tablet take 1 tablet by mouth twice a day Patient taking differently: take 40 mg by mouth twice a day 01/01/15  Yes Minus Breeding, MD  pravastatin (PRAVACHOL) 80 MG tablet Take 1 tablet (80 mg  total) by mouth every morning. 07/29/15  Yes Minus Breeding, MD  Probiotic Product (PROBIOTIC DAILY) CAPS Take 1 capsule by mouth at bedtime.   Yes Historical Provider, MD  RA VITAMIN B-12 TR 1000 MCG TBCR take 1 tablet by mouth once daily Patient taking differently: take 1000 mcg by mouth once daily 01/23/14  Yes Rowe Clack, MD  torsemide (DEMADEX) 20 MG tablet Take 20 mg by mouth daily.   Yes Historical Provider, MD  traZODone (DESYREL) 50 MG tablet Take 0.5-1 tablets (25-50 mg total) by mouth at bedtime as needed for sleep. 09/08/15  Yes Binnie Rail, MD  valsartan (DIOVAN) 320 MG tablet Take 1 tablet (320 mg total) by mouth daily. 08/10/15  Yes Binnie Rail, MD    Physical Exam: Patient Vitals for the past 24 hrs:  BP Temp Temp src Pulse Resp SpO2  09/22/15 2345 171/84 mmHg - - 109 19 92 %  09/22/15 2315 149/75 mmHg - - 107 14 92 %  09/22/15 2300 151/87 mmHg - - 105 15 94 %  09/22/15 2152 - - - - - 97 %  09/22/15 2130 177/76 mmHg - - 111 17 95 %  09/22/15 2115 156/90 mmHg - - (!) 123 20 96 %  09/22/15 2100 172/81 mmHg - - 110 18 96 %  09/22/15 2045 170/86 mmHg - - 109 20 96 %  09/22/15 2030 149/65 mmHg - - 93 24 94 %  09/22/15 2015 139/84 mmHg - - 106 20 95 %  09/22/15 1900 154/72 mmHg - - 114 19 95 %  09/22/15 1852 156/79 mmHg 97.9 F (36.6 C) Oral 113 17 96 %    1. General:  in No Acute distress 2. Psychological: Alert and   Oriented 3. Head/ENT:     Dry Mucous Membranes                          Head Non traumatic, neck supple                            Poor Dentition 4. SKIN:   decreased Skin turgor,  Skin clean Dry and intact no rash 5. Heart: Regular rate and rhythm no Murmur, Rub or gallop 6. Lungs: occasionally wheezes some  crackles   7. Abdomen: Soft, non-tender, Non distended 8. Lower extremities: no clubbing, cyanosis, or edema 9. Neurologically Grossly intact, moving all 4 extremities equally 10. MSK: Normal range of motion   body mass index is unknown because there is no weight on file.  Labs on Admission:   Labs on Admission: I have personally reviewed following labs and imaging studies  CBC:  Recent Labs Lab 09/20/15 1945 09/22/15 1933  WBC 10.4* 7.6  NEUTROABS  --  4.5  HGB 15.6 13.3  HCT 42.8* 39.4  MCV 83.6 83.5  PLT  --  0000000   Basic Metabolic Panel:  Recent Labs Lab 09/20/15 1955 09/22/15 1933  NA 132* 131*  K 3.9 3.6  CL 97* 94*  CO2 26 25  GLUCOSE 112* 122*  BUN 16 22*  CREATININE 0.89 1.07  CALCIUM 9.5 9.2   GFR: CrCl cannot be calculated (Unknown ideal weight.). Liver Function Tests:  Recent Labs Lab 09/22/15 1933  AST 37  ALT 21  ALKPHOS 72  BILITOT 1.4*  PROT 6.1*  ALBUMIN 3.6   No results for input(s): LIPASE, AMYLASE in the last 168 hours. No results  for input(s): AMMONIA in the last 168 hours. Coagulation Profile: No results for input(s): INR, PROTIME in the last 168 hours. Cardiac Enzymes:  Recent Labs Lab 09/22/15 1933  TROPONINI <0.03   BNP (last 3 results) No results for input(s): PROBNP in the last 8760 hours. HbA1C: No results for input(s): HGBA1C in the last 72 hours. CBG: No results for input(s): GLUCAP in the last 168 hours. Lipid Profile: No results for input(s): CHOL, HDL, LDLCALC, TRIG, CHOLHDL, LDLDIRECT in the last 72 hours. Thyroid Function Tests: No results for input(s): TSH, T4TOTAL, FREET4, T3FREE, THYROIDAB in the last 72 hours. Anemia Panel: No results for input(s): VITAMINB12, FOLATE, FERRITIN, TIBC, IRON, RETICCTPCT in the last 72 hours. Urine analysis:    Component Value Date/Time   COLORURINE STRAW* 05/17/2015 2241   APPEARANCEUR CLEAR 05/17/2015 2241   LABSPEC 1.007 05/17/2015 2241   PHURINE 7.0 05/17/2015  2241   GLUCOSEU NEGATIVE 05/17/2015 2241   GLUCOSEU NEGATIVE 06/19/2014 0941   HGBUR NEGATIVE 05/17/2015 2241   BILIRUBINUR negative 09/20/2015 1946   BILIRUBINUR NEGATIVE 05/17/2015 2241   KETONESUR negative 09/20/2015 1946   KETONESUR NEGATIVE 05/17/2015 2241   PROTEINUR =100* 09/20/2015 1946   PROTEINUR NEGATIVE 05/17/2015 2241   UROBILINOGEN 0.2 09/20/2015 1946   UROBILINOGEN 0.2 09/17/2014 2215   NITRITE Negative 09/20/2015 1946   NITRITE NEGATIVE 05/17/2015 2241   LEUKOCYTESUR Negative 09/20/2015 1946   Sepsis Labs: @LABRCNTIP (procalcitonin:4,lacticidven:4) )No results found for this or any previous visit (from the past 240 hour(s)).     UA Ordered  Lab Results  Component Value Date   HGBA1C 5.7 06/02/2015    CrCl cannot be calculated (Unknown ideal weight.).  BNP (last 3 results) No results for input(s): PROBNP in the last 8760 hours.   ECG REPORT  Independently reviewed Rate:112  Rhythm: sinus tachycardia ST&T Change: No acute ischemic changes  QTC 469  There were no vitals filed for this visit.   Cultures:    Component Value Date/Time   SDES BLOOD LEFT ARM 01/01/2014 1205   SPECREQUEST BOTTLES DRAWN AEROBIC AND ANAEROBIC 5CC 01/01/2014 1205   CULT  01/01/2014 1205    NO GROWTH 5 DAYS Performed at [facility] 01/07/2014 FINAL 01/01/2014 1205     Radiological Exams on Admission: Dg Chest 2 View  09/22/2015  CLINICAL DATA:  Shortness of breath, wheezing, tachycardia EXAM: CHEST  2 VIEW COMPARISON:  05/17/2015 FINDINGS: Cardiomediastinal silhouette is stable. Status post CABG. No acute infiltrate or pleural effusion. No pulmonary edema. Osteopenia and mild degenerative changes thoracic spine. IMPRESSION: No active cardiopulmonary disease. Electronically Signed   By: Lahoma Crocker M.D.   On: 09/22/2015 20:05    Chart has been reviewed    Assessment/Plan  82 y.o. male with medical history significant of CAD status post CABG, OSA,  HTN, PVD,   Subclavian stenosis, sinus post PCI recurrent hyponatremia intermittent tachycardia here with asthma/COPD exacerbation, tachycardia mild dehydration  Present on Admission:  . COPD exacerbation ([place]) -  - Will initiate Steroid taper, antibiotics, Albuterol PRN, scheduled Atrovent, Dulera and Mucinex. Titrate O2 to saturation >90%. Follow patients respiratory status.  . Accelerated hypertension blood pressure currently improving we will make sure he restart home medications holding parameters  . Chronic diastolic heart failure (HCC) currently appears to be somewhat fluid down will rehydrate gently  . Essential hypertension restart home medications will need for close follow-up with cardiology  . PAF (paroxysmal atrial fibrillation) (HCC) continue anticoagulation and currently appears to be regulating  sinus tachycardia  . PAD (peripheral artery disease) ([place]) continue home medications  . Tachycardia patient has episodes of this in the past. I suspect this is due to mild dehydration as well as adjustment of his medications will make sure we restart Coreg and give gentle fluid  . Obstructive sleep apnea prefers not to use hospital C Pap will bring his own  . Hyponatremia chronic recurrent patient drinks large amount of free water. Currently appears to be slightly dehydrated side will give gentle fluids and monitor  . history of coronary artery disease - currently with atypical symptoms of dyspnea suspect more pulmonary related. But given extensive history of cycle cardiac enzymes Other plan as per orders.  DVT prophylaxis:   eliquis Code Status:  FULL CODE  as per patient    Family Communication:   Family   at  Bedside  plan of care was discussed with   Wife   Disposition Plan:     To home once workup is complete and patient is stable   Consults called: none   Admission status:  obs    Level of care     tele        [place] 09/23/2015, 2:18 AM    [facility]    Pager [phone]   after 2 AM please page floor coverage PA If 7AM-7PM, please contact the day team taking care of the patient  [email or web address]  Password [email or web address]

## 2015-09-23 NOTE — Care Management Note (Signed)
Case Management Note  Patient Details  Name: [name]. MRN: [id number] Date of Birth: [birth year]  Subjective/Objective:                  82 yo male win ER with elevated blood pressure and heart rate. /From home with spouse.  Action/Plan: Follow for disposition needs.   Expected Discharge Date:  09/25/15               Expected Discharge Plan:  [place]  In-House Referral:     Discharge planning Services  CM Consult  Post Acute Care Choice:    Choice offered to:     DME Arranged:    DME Agency:     HH Arranged:    HH Agency:     Status of Service:  In process, will continue to follow  Medicare Important Message Given:    Date Medicare IM Given:    Medicare IM give by:    Date Additional Medicare IM Given:    Additional Medicare Important Message give by:     If discussed at [place] of Stay Meetings, dates discussed:    Additional Comments:  Fuller Mandril, RN 09/23/2015, 9:19 AM

## 2015-09-23 NOTE — ED Notes (Signed)
Pt stated he felt like he was having a heart attack. Pt also stated that he felt like he had seen stars and the pain was all across his chest. Nurse was notified. Repeat EKG done and given to nurse.

## 2015-09-23 NOTE — ED Notes (Addendum)
Pt reports episodes of htn and hotn at home, BP 171/145 at 1100, BP 94/50 at 1130. Pt a&o x4, talking with RN, no pain. Will notify admitting.

## 2015-09-23 NOTE — Progress Notes (Signed)
Pt wears CPAP at home but decline to use during this visit.

## 2015-09-23 NOTE — Telephone Encounter (Signed)
Left message asking patient if he would like to restart prolia injections----can talk with tamara if any questions, or if he wants to restart injections, insurance will need to be reverfied and i can call patient back with insurance rules for prolia injections

## 2015-09-23 NOTE — ED Notes (Signed)
Paged Dr. Sheran Fava - pt reported CP across chest and seeing stars. EKG done. VSS. CP has since resolved. No new orders at this time. Will continue to monitor pt via cardiac monitor, pulse ox, and bp cuff.

## 2015-09-24 ENCOUNTER — Inpatient Hospital Stay (HOSPITAL_BASED_OUTPATIENT_CLINIC_OR_DEPARTMENT_OTHER): Payer: Medicare Other

## 2015-09-24 ENCOUNTER — Telehealth: Payer: Self-pay | Admitting: Physician Assistant

## 2015-09-24 DIAGNOSIS — J441 Chronic obstructive pulmonary disease with (acute) exacerbation: Secondary | ICD-10-CM | POA: Diagnosis not present

## 2015-09-24 DIAGNOSIS — J449 Chronic obstructive pulmonary disease, unspecified: Secondary | ICD-10-CM | POA: Diagnosis not present

## 2015-09-24 DIAGNOSIS — R000 Tachycardia, unspecified: Secondary | ICD-10-CM

## 2015-09-24 DIAGNOSIS — I2510 Atherosclerotic heart disease of native coronary artery without angina pectoris: Secondary | ICD-10-CM | POA: Diagnosis not present

## 2015-09-24 DIAGNOSIS — G4733 Obstructive sleep apnea (adult) (pediatric): Secondary | ICD-10-CM

## 2015-09-24 DIAGNOSIS — I10 Essential (primary) hypertension: Secondary | ICD-10-CM | POA: Diagnosis not present

## 2015-09-24 DIAGNOSIS — R079 Chest pain, unspecified: Secondary | ICD-10-CM | POA: Diagnosis not present

## 2015-09-24 DIAGNOSIS — I5032 Chronic diastolic (congestive) heart failure: Secondary | ICD-10-CM

## 2015-09-24 LAB — BASIC METABOLIC PANEL
Anion gap: 9 (ref 5–15)
BUN: 25 mg/dL — AB (ref 6–20)
CALCIUM: 9.3 mg/dL (ref 8.9–10.3)
CO2: 25 mmol/L (ref 22–32)
Chloride: 101 mmol/L (ref 101–111)
Creatinine, Ser: 1.19 mg/dL (ref 0.61–1.24)
GFR calc Af Amer: >60 mL/min (ref >60)
GFR, EST NON AFRICAN AMERICAN: 55 mL/min — AB (ref >60)
Glucose, Bld: 207 mg/dL — ABNORMAL HIGH (ref 65–99)
Potassium: 3.5 mmol/L (ref 3.5–5.1)
Sodium: 135 mmol/L (ref 135–145)

## 2015-09-24 LAB — CBC
HEMATOCRIT: 39.9 % (ref 39.0–52.0)
Hemoglobin: 13.3 g/dL (ref 13.0–17.0)
MCH: 28.4 pg (ref 26.0–34.0)
MCHC: 33.3 g/dL (ref 30.0–36.0)
MCV: 85.1 fL (ref 78.0–100.0)
Platelets: 190 10*3/uL (ref 150–400)
RBC: 4.69 MIL/uL (ref 4.22–5.81)
RDW: 14.4 % (ref 11.5–15.5)
WBC: 5.9 10*3/uL (ref 4.0–10.5)

## 2015-09-24 LAB — URINE CULTURE: SPECIAL REQUESTS: NORMAL

## 2015-09-24 LAB — ECHOCARDIOGRAM COMPLETE
AOPV: 0.38 m/s
AV Mean grad: 17 mmHg
AV Peak grad: 31 mmHg
AV pk vel: 278 cm/s
AV vel: 0.99
AVAREAMEANV: 0.89 cm2
AVAREAMEANVIN: 0.54 cm2/m2
AVAREAVTI: 0.96 cm2
AVAREAVTIIND: 0.6 cm2/m2
AVCELMEANRAT: 0.35
CHL CUP AV PEAK INDEX: 0.58
CHL CUP AV VALUE AREA INDEX: 0.6
CHL CUP DOP CALC LVOT VTI: 21.6 cm
CHL CUP MV M VEL: 80.5
CHL CUP STROKE VOLUME: 28 mL
DOP CAL AO MEAN VELOCITY: 189 cm/s
E decel time: 144 msec
EERAT: 21.8
FS: 25 % — AB (ref 28–44)
HEIGHTINCHES: 62 in
IV/PV OW: 1.15
LA diam end sys: 50 mm
LA diam index: 3.03 cm/m2
LA vol: 59.2 mL
LASIZE: 50 mm
LAVOLA4C: 48.9 mL
LAVOLIN: 35.9 mL/m2
LDCA: 2.54 cm2
LV E/e'average: 21.8
LV dias vol index: 29 mL/m2
LV dias vol: 48 mL — AB (ref 62–150)
LV e' LATERAL: 5 cm/s
LV sys vol index: 13 mL/m2
LV sys vol: 21 mL (ref 21–61)
LVEEMED: 21.8
LVOT MV VTI INDEX: 1.09 cm2/m2
LVOT MV VTI: 1.8
LVOT SV: 55 mL
LVOT peak VTI: 0.39 cm
LVOT peak grad rest: 4 mmHg
LVOT peak vel: 105 cm/s
LVOTD: 18 mm
MV Annulus VTI: 30.5 cm
MV Dec: 144
MV pk A vel: 140 m/s
MVG: 3 mmHg
MVPG: 5 mmHg
MVPKEVEL: 109 m/s
PW: 10.7 mm — AB (ref 0.6–1.1)
RV TAPSE: 18.3 mm
Reg peak vel: 270 cm/s
Simpson's disk: 57
TDI e' lateral: 5
TR max vel: 270 cm/s
VTI: 55.4 cm
Valve area: 0.99 cm2
WEIGHTICAEL: 2257.6 [oz_av]

## 2015-09-24 LAB — HEMOGLOBIN A1C
Hgb A1c MFr Bld: 5.9 % — ABNORMAL HIGH (ref 4.8–5.6)
Mean Plasma Glucose: 123 mg/dL

## 2015-09-24 MED ORDER — DILTIAZEM HCL 30 MG PO TABS
30.0000 mg | ORAL_TABLET | Freq: Four times a day (QID) | ORAL | Status: DC
Start: 2015-09-24 — End: 2015-09-25
  Administered 2015-09-24 – 2015-09-25 (×5): 30 mg via ORAL
  Filled 2015-09-24 (×4): qty 1

## 2015-09-24 MED ORDER — SODIUM CHLORIDE 0.9 % IV BOLUS (SEPSIS): 500.0000 mL | Freq: Once | INTRAVENOUS | Status: DC

## 2015-09-24 MED ORDER — SENNA 8.6 MG PO TABS: 2.0000 | ORAL_TABLET | Freq: Every evening | ORAL | Status: DC | PRN

## 2015-09-24 MED ORDER — DOCUSATE SODIUM 100 MG PO CAPS
100.0000 mg | ORAL_CAPSULE | Freq: Two times a day (BID) | ORAL | Status: DC
  Administered 2015-09-24 – 2015-09-26 (×4): 100 mg via ORAL
  Filled 2015-09-24 (×4): qty 1

## 2015-09-24 MED ORDER — BISOPROLOL FUMARATE 5 MG PO TABS
20.0000 mg | ORAL_TABLET | Freq: Every day | ORAL | Status: DC
  Administered 2015-09-24 – 2015-09-26 (×3): 20 mg via ORAL
  Filled 2015-09-24 (×3): qty 4

## 2015-09-24 MED ORDER — LORAZEPAM 0.5 MG PO TABS: 0.5000 mg | ORAL_TABLET | Freq: Once | ORAL | Status: DC

## 2015-09-24 NOTE — Care Management Obs Status (Signed)
[facility] NOTIFICATION   Patient Details  Name: [name]. MRN: [id number] Date of Birth: 1931/01/04   Medicare Observation Status Notification Given:  Yes    Bethena Roys, RN 09/24/2015, 4:16 PM

## 2015-09-24 NOTE — Progress Notes (Signed)
Paged Internal medicine on call. Patient's HR has been 120s-138s for the past hour. Last BP 164/81. Patient is on 2 liters of oxygen per nasal cannula. Complains of headache when HR goes up high. Resting at the moment but HR is still consistently high. Waiting for a call back. Will continue to monitor patient.

## 2015-09-24 NOTE — Evaluation (Signed)
Physical Therapy Evaluation Patient Details Name: [name]. MRN: [id number] DOB: 1930/10/13 Today's Date: 09/24/2015   History of Present Illness  82 y.o. male with medical history significant of CAD status post CABG, OSA, HTN, PVD,Subclavian stenosis, sinus post PCI recurrent hyponatremia intermittent tachycardia. Presented with 2 week hx of elevated blood pressure and HR worse over past few days. He has been wheezing and more short of breath  Clinical Impression  Patient seen for evaluation. Mobilizing well. Ambulated in hall increased distance with HR reaching mid 120s, modest SOB not significant LOB. Educated patient on mobility expectations and safety. Recommend resumption of outpatient PT services upon discharge. No further acute PT needs. Will sign off. Nsg aware    Follow Up Recommendations Outpatient PT (resume outpatient PT services)    Equipment Recommendations  None recommended by PT    Recommendations for Other Services       Precautions / Restrictions Precautions Precautions: Fall Restrictions Weight Bearing Restrictions: No      Mobility  Bed Mobility Overal bed mobility: Modified Independent             General bed mobility comments: using of bed rail  Transfers Overall transfer level: Modified independent Equipment used: None             General transfer comment: increased time to elevate to standing  Ambulation/Gait Ambulation/Gait assistance: Modified independent (Device/Increase time) Ambulation Distance (Feet): 380 Feet Assistive device: None Gait Pattern/deviations: Step-through pattern;Decreased stride length;Shuffle;Drifts right/left Gait velocity: decreased Gait velocity interpretation: Below normal speed for age/gender General Gait Details: modest instbaility noted but no need for cues or physical assist  Stairs            Wheelchair Mobility    Modified Rankin (Stroke Patients Only)       Balance Overall  balance assessment: History of Falls                                           Pertinent Vitals/Pain Pain Assessment: No/denies pain    Home Living Family/patient expects to be discharged to:: Private residence Living Arrangements: Spouse/significant other Available Help at Discharge: Family;Available 24 hours/day (wife is limited assist due to her own heatlh issues) Type of Home: House Home Access: Stairs to enter Entrance Stairs-Rails: Right (wall on L) Entrance Stairs-Number of Steps: 2 Home Layout: One level Home Equipment: None      Prior Function Level of Independence: Independent         Comments: pt drives, likes to do yard work     Journalist, newspaper   Dominant Hand: Right    Extremity/Trunk Assessment   Upper Extremity Assessment: Overall WFL for tasks assessed           Lower Extremity Assessment: Overall WFL for tasks assessed (history of baseline strength/coorindation deficts from CVA)         Communication   Communication: HOH  Cognition Arousal/Alertness: Awake/alert Behavior During Therapy: WFL for tasks assessed/performed Overall Cognitive Status: History of cognitive impairments - at baseline Area of Impairment: Memory;Safety/judgement;Problem solving     Memory: Decreased short-term memory   Safety/Judgement: Decreased awareness of deficits   Problem Solving: Slow processing General Comments: Per daughter patient with some memory issues since CVA    General Comments General comments (skin integrity, edema, etc.): HR elevated to mid 120s    Exercises  Assessment/Plan    PT Assessment All further PT needs can be met in the next venue of care  PT Diagnosis Difficulty walking   PT Problem List Decreased activity tolerance;Decreased balance;Decreased mobility;Cardiopulmonary status limiting activity  PT Treatment Interventions     PT Goals (Current goals can be found in the Care Plan section) Acute Rehab PT  Goals Patient Stated Goal: to get this figured out PT Goal Formulation: All assessment and education complete, DC therapy    Frequency     Barriers to discharge        Co-evaluation               End of Session Equipment Utilized During Treatment: Gait belt Activity Tolerance: Patient tolerated treatment well Patient left: in bed;with call bell/phone within reach;with family/visitor present Nurse Communication: Mobility status    Functional Assessment Tool Used: clinical judgement Functional Limitation: Mobility: Walking and moving around Mobility: Walking and Moving Around Current Status (Y6168): At least 1 percent but less than 20 percent impaired, limited or restricted Mobility: Walking and Moving Around Goal Status [phone]): At least 1 percent but less than 20 percent impaired, limited or restricted Mobility: Walking and Moving Around Discharge Status [phone]): At least 1 percent but less than 20 percent impaired, limited or restricted    Time: 1047-1105 PT Time Calculation (min) (ACUTE ONLY): 18 min   Charges:   PT Evaluation $PT Eval Low Complexity: 1 Procedure     PT G Codes:   PT G-Codes **NOT FOR INPATIENT CLASS** Functional Assessment Tool Used: clinical judgement Functional Limitation: Mobility: Walking and moving around Mobility: Walking and Moving Around Current Status ([id number]): At least 1 percent but less than 20 percent impaired, limited or restricted Mobility: Walking and Moving Around Goal Status [phone]): At least 1 percent but less than 20 percent impaired, limited or restricted Mobility: Walking and Moving Around Discharge Status [phone]): At least 1 percent but less than 20 percent impaired, limited or restricted    Duncan Dull 09/24/2015, 1:11 PM Alben Deeds, [place] DPT  [phone]

## 2015-09-24 NOTE — Telephone Encounter (Signed)
Discussed with Alyse Low, need for sooner appt.  Scheduling with PA, in 5 days.  She states she will contact patient regarding this sooner visit.

## 2015-09-24 NOTE — Progress Notes (Signed)
PROGRESS NOTE  [name].  DH:[id number] DOB: 01-Feb-1931 DOA: 09/22/2015 PCP: Binnie Rail, MD  Brief Narrative:   [name]. is a 82 y.o. male with medical history significant of CAD status post CABG, OSA, HTN, PVD,Subclavian stenosis, sinus post PCI recurrent hyponatremia intermittent tachycardia.  Presented with 2 week hx of elevated blood pressure and HR worse over past few days. He has been wheezing and more Geordan Xu of breath.  Family checks blood pressure on a regular basis and adjusts medications frequently. Patient's wife called cardiology and was told to stop the Toprol and change to carvedilol 25 twice a day.  Has had labile blood pressures, both high and low.  IN ER: Afebrile heart rate initially up to 123 respirations 18 blood pressure 141/82 Patient was given albuterol secondary to wheezing WBC 10.4 hemoglobin 14.3 sodium down to 131 creatinine 1.07 troponin unremarkable BNP within normal limits EKG nonacute chest x-ray show no acute findings  Assessment & Plan:   Active Problems:   Asthma with COPD ([place])   Obstructive sleep apnea   Chronic diastolic heart failure (HCC)   PAF (paroxysmal atrial fibrillation) (HCC)   Accelerated hypertension   CAD S/P S-PDA DES 2012   Tachycardia   Chest pain   PAD (peripheral artery disease) (HCC)   Hyponatremia   Essential hypertension   COPD (chronic obstructive pulmonary disease) (HCC)   COPD exacerbation (HCC)  Labile blood pressures, I suspect this is due to his clonidine.  After his morning clonidine, his blood pressure dropped to the 0000000 systolic.  Also, I suspect his increased wheeze may be related to changing to a less selective beta blocker.  Tachycardic to 140s this morning.  May be rebound from clonidine. -  D/c norvasc -  Start dilt 30mg  po q6h and plan to transition to XR tomorrow if BP and HR stable -  Continue bisoprolol -  Continue spironolactone -  Continue ARB -  BP still elevated but markedly  better than what they had been at home -  Continue to trend heart rate overnight  -  IF heart rate and blood pressure continue in this range or are improved, plan to discharge to home tomorrow  Sinus tachycardia -  TSH wnl -  D-dimer, negative -  Will try to minimize beta agonists/anticholinergics (xopenex) -  May have increased tachycardia after stopping clonidine  Dyspnea likely due to acute COPD exacerbation -  continue steroids -  Continue doxycycline -  Change to budesonide and brovana until breathing more comfortably -  Continue atrovent and xopenex nebs -  Continue BID PPI -  ECHO:  Diastolic heart failure (consistent with history of uncontrolled hypertension)  Paroxysmal atrial fibrillation, CHADs2vasc = 4   -  Sinus tachycardia on telemetry -  Continue eliquis -  Change to bisoprolol  OSA, offer CPAP  Hyponatremia, resolved with IVF  CAD with episode of chest pain this morning.  -  ECG unchanged -  troponins negative -  Suspect chest tightness is related to copd -  No arrhythmias on telemetry at time of event  PAD, stable, continue eliquis  Chronic diastolic heart failure -  Monitor for signs of hypervolemia  Anxiety -  Continue scheduled ativan   DVT prophylaxis:  eliquis Code Status:  Full Family Communication:  Patient and wife at bedside Disposition Plan:  Probably home tomorrow   Consultants:   none  Procedures:  ECHO pending  Antimicrobials:   Doxycycline 6/15   Subjective:  Feeling much better after seeing that his heart rate and blood pressure have come down.  Still feeling Tyson Masin of breath.  Needs something for constipation.    Objective: Filed Vitals:   09/24/15 0500 09/24/15 0924 09/24/15 1430 09/24/15 1432  BP: 164/81   147/53  Pulse: 114 116 88 89  Temp: 98 F (36.7 C)   98.1 F (36.7 C)  TempSrc: Oral   Oral  Resp: 22 20 18    Height:      Weight: 64.003 kg (141 lb 1.6 oz)     SpO2: 93% 99% 99% 89%    Intake/Output  Summary (Last 24 hours) at 09/24/15 1845 Last data filed at 09/24/15 1300  Gross per 24 hour  Intake    603 ml  Output      0 ml  Net    603 ml   Filed Weights   09/23/15 1345 09/24/15 0500  Weight: 63.866 kg (140 lb 12.8 oz) 64.003 kg (141 lb 1.6 oz)    Examination:  General exam:  Adult male.  No acute distress.  HEENT:  NCAT, MMM Respiratory system:  Diminished bilateral breath sounds, wheeze has improved.  Persistent prolonged exp phase Cardiovascular system: tachycardic to 120s on exam, normal rhythm, normal S1/S2. No murmurs, rubs, gallops or clicks.  Warm extremities Gastrointestinal system: Normal active bowel sounds, soft, nondistended, nontender. MSK:  Normal tone and bulk, no lower extremity edema Neuro:  Grossly intact    Data Reviewed: I have personally reviewed following labs and imaging studies  CBC:  Recent Labs Lab 09/20/15 1945 09/22/15 1933 09/23/15 0540 09/23/15 1140 09/24/15 0445  WBC 10.4* 7.6 5.2 6.2 5.9  NEUTROABS  --  4.5  --   --   --   HGB 15.6 13.3 18.2* 13.0 13.3  HCT 42.8* 39.4 52.7* 38.9* 39.9  MCV 83.6 83.5 84.9 84.2 85.1  PLT  --  181 94* [phone]   Basic Metabolic Panel:  Recent Labs Lab 09/20/15 1955 09/22/15 1933 09/23/15 0540 09/24/15 0445  NA 132* 131* 135 135  K 3.9 3.6 3.2* 3.5  CL 97* 94* 97* 101  CO2 26 25 28 25   GLUCOSE 112* 122* 100* 207*  BUN 16 22* 16 25*  CREATININE 0.89 1.07 0.99 1.19  CALCIUM 9.5 9.2 9.1 9.3  MG  --   --  2.0  --   PHOS  --   --  3.6  --    GFR: Estimated Creatinine Clearance: 37 mL/min (by C-G formula based on Cr of 1.19). Liver Function Tests:  Recent Labs Lab 09/22/15 1933 09/23/15 0540  AST 37 23  ALT 21 19  ALKPHOS 72 72  BILITOT 1.4* 1.1  PROT 6.1* 6.3*  ALBUMIN 3.6 3.5   No results for input(s): LIPASE, AMYLASE in the last 168 hours. No results for input(s): AMMONIA in the last 168 hours. Coagulation Profile: No results for input(s): INR, PROTIME in the last 168  hours. Cardiac Enzymes:  Recent Labs Lab 09/22/15 1933 09/23/15 0540 09/23/15 1140 09/23/15 1625  TROPONINI <0.03 <0.03 <0.03 0.03   BNP (last 3 results) No results for input(s): PROBNP in the last 8760 hours. HbA1C:  Recent Labs  09/23/15 0541  HGBA1C 5.9*   CBG: No results for input(s): GLUCAP in the last 168 hours. Lipid Profile: No results for input(s): CHOL, HDL, LDLCALC, TRIG, CHOLHDL, LDLDIRECT in the last 72 hours. Thyroid Function Tests:  Recent Labs  09/23/15 0540  TSH 1.179   Anemia Panel: No  results for input(s): VITAMINB12, FOLATE, FERRITIN, TIBC, IRON, RETICCTPCT in the last 72 hours. Urine analysis:    Component Value Date/Time   COLORURINE YELLOW 09/23/2015 0605   APPEARANCEUR CLEAR 09/23/2015 0605   LABSPEC 1.013 09/23/2015 0605   PHURINE 6.0 09/23/2015 0605   GLUCOSEU NEGATIVE 09/23/2015 0605   GLUCOSEU NEGATIVE 06/19/2014 0941   HGBUR MODERATE* 09/23/2015 0605   BILIRUBINUR NEGATIVE 09/23/2015 0605   BILIRUBINUR negative 09/20/2015 1946   KETONESUR NEGATIVE 09/23/2015 0605   KETONESUR negative 09/20/2015 1946   PROTEINUR NEGATIVE 09/23/2015 0605   PROTEINUR =100* 09/20/2015 1946   UROBILINOGEN 0.2 09/20/2015 1946   UROBILINOGEN 0.2 09/17/2014 2215   NITRITE NEGATIVE 09/23/2015 0605   NITRITE Negative 09/20/2015 1946   LEUKOCYTESUR NEGATIVE 09/23/2015 0605   Sepsis Labs: @LABRCNTIP (procalcitonin:4,lacticidven:4)  ) Recent Results (from the past 240 hour(s))  Urine culture     Status: Abnormal   Collection Time: 09/23/15  6:05 AM  Result Value Ref Range Status   Specimen Description URINE, RANDOM  Final   Special Requests Normal  Final   Culture MULTIPLE SPECIES PRESENT, SUGGEST RECOLLECTION (A)  Final   Report Status 09/24/2015 FINAL  Final      Radiology Studies: Dg Chest 2 View  09/22/2015  CLINICAL DATA:  Shortness of breath, wheezing, tachycardia EXAM: CHEST  2 VIEW COMPARISON:  05/17/2015 FINDINGS: Cardiomediastinal  silhouette is stable. Status post CABG. No acute infiltrate or pleural effusion. No pulmonary edema. Osteopenia and mild degenerative changes thoracic spine. IMPRESSION: No active cardiopulmonary disease. Electronically Signed   By: Lahoma Crocker M.D.   On: 09/22/2015 20:05     Scheduled Meds: . apixaban  5 mg Oral BID  . arformoterol  15 mcg Nebulization BID  . bisoprolol  20 mg Oral Daily  . budesonide (PULMICORT) nebulizer solution  0.5 mg Nebulization BID  . clopidogrel  75 mg Oral Q breakfast  . diltiazem  30 mg Oral Q6H  . doxycycline  100 mg Oral Q12H  . guaiFENesin  600 mg Oral BID  . ipratropium  0.5 mg Nebulization Q8H  . irbesartan  300 mg Oral Daily  . isosorbide mononitrate  60 mg Oral BID  . latanoprost  1 drop Both Eyes QHS  . LORazepam  0.5 mg Oral TID  . LORazepam  0.5 mg Oral Once  . montelukast  10 mg Oral QHS  . pantoprazole  40 mg Oral BID  . pravastatin  80 mg Oral q morning - 10a  . predniSONE  40 mg Oral BID  . sodium chloride  500 mL Intravenous Once  . sodium chloride flush  3 mL Intravenous Q12H  . spironolactone  25 mg Oral Daily   Continuous Infusions:    LOS: 1 day    Time spent: 30 min    Janece Canterbury, MD [facility] Pager [phone]  If 7PM-7AM, please contact night-coverage [email or web address] Password [facility] 09/24/2015, 6:45 PM

## 2015-09-24 NOTE — Progress Notes (Signed)
Echocardiogram 2D Echocardiogram has been performed.  Joelene Millin 09/24/2015, 1:52 PM

## 2015-09-24 NOTE — Plan of Care (Signed)
Problem: Activity: Goal: Risk for activity intolerance will decrease Outcome: Progressing Pt walked with PT today, sats remained WNL , encouraged pt to get up to chair for meals, ambulates to restroom with minimal assist.

## 2015-09-24 NOTE — Procedures (Signed)
Pt does not wish to wear cpap, will inform RT if anything changes. 

## 2015-09-25 ENCOUNTER — Observation Stay (HOSPITAL_COMMUNITY): Payer: Medicare Other

## 2015-09-25 DIAGNOSIS — I2510 Atherosclerotic heart disease of native coronary artery without angina pectoris: Secondary | ICD-10-CM

## 2015-09-25 DIAGNOSIS — I5032 Chronic diastolic (congestive) heart failure: Secondary | ICD-10-CM | POA: Diagnosis not present

## 2015-09-25 DIAGNOSIS — Z9861 Coronary angioplasty status: Secondary | ICD-10-CM

## 2015-09-25 DIAGNOSIS — J449 Chronic obstructive pulmonary disease, unspecified: Secondary | ICD-10-CM

## 2015-09-25 DIAGNOSIS — I10 Essential (primary) hypertension: Secondary | ICD-10-CM | POA: Diagnosis not present

## 2015-09-25 DIAGNOSIS — R0600 Dyspnea, unspecified: Secondary | ICD-10-CM | POA: Diagnosis not present

## 2015-09-25 DIAGNOSIS — J45909 Unspecified asthma, uncomplicated: Secondary | ICD-10-CM

## 2015-09-25 LAB — BRAIN NATRIURETIC PEPTIDE: B NATRIURETIC PEPTIDE 5: 345.5 pg/mL — AB (ref 0.0–100.0)

## 2015-09-25 MED ORDER — DILTIAZEM HCL ER COATED BEADS 180 MG PO CP24: 180.0000 mg | ORAL_CAPSULE | Freq: Every day | ORAL | Status: DC

## 2015-09-25 MED ORDER — PREDNISONE 20 MG PO TABS
40.0000 mg | ORAL_TABLET | Freq: Every day | ORAL | Status: DC
  Administered 2015-09-25 – 2015-09-26 (×2): 40 mg via ORAL
  Filled 2015-09-25 (×2): qty 2

## 2015-09-25 MED ORDER — IPRATROPIUM-ALBUTEROL 0.5-2.5 (3) MG/3ML IN SOLN
3.0000 mL | Freq: Four times a day (QID) | RESPIRATORY_TRACT | Status: DC
  Administered 2015-09-25 (×3): 3 mL via RESPIRATORY_TRACT
  Filled 2015-09-25 (×3): qty 3

## 2015-09-25 MED ORDER — DILTIAZEM HCL ER COATED BEADS 240 MG PO CP24
240.0000 mg | ORAL_CAPSULE | Freq: Every day | ORAL | Status: DC
  Administered 2015-09-25 – 2015-09-26 (×2): 240 mg via ORAL
  Filled 2015-09-25 (×2): qty 1

## 2015-09-25 MED ORDER — SPIRONOLACTONE 25 MG PO TABS
50.0000 mg | ORAL_TABLET | Freq: Every day | ORAL | Status: DC
  Administered 2015-09-26: 50 mg via ORAL
  Filled 2015-09-25: qty 2

## 2015-09-25 MED ORDER — TORSEMIDE 20 MG PO TABS
40.0000 mg | ORAL_TABLET | Freq: Two times a day (BID) | ORAL | Status: DC
  Administered 2015-09-25 – 2015-09-26 (×2): 40 mg via ORAL
  Filled 2015-09-25 (×2): qty 2

## 2015-09-25 MED ORDER — PREDNISONE 20 MG PO TABS: 40.0000 mg | ORAL_TABLET | Freq: Every day | ORAL | Status: DC

## 2015-09-25 NOTE — Progress Notes (Signed)
Patient sat maintained around 95-96 % on Room air, Patient stated he felt short of breath and dizzy while walking. We did about 150 feet total.

## 2015-09-25 NOTE — Progress Notes (Signed)
Patient refused to have  Bed alarm turned on despite educating him on the fall and safety plan. [facility] [employer]

## 2015-09-25 NOTE — Progress Notes (Signed)
PROGRESS NOTE  [name].  DH:[id number] DOB: [birth year] DOA: 09/22/2015 PCP: Binnie Rail, MD  Brief Narrative:   [name]. is a 82 y.o. male with medical history significant of CAD status post CABG, OSA, HTN, PVD,Subclavian stenosis, sinus post PCI recurrent hyponatremia intermittent tachycardia.  Presented with 2 week hx of elevated blood pressure and HR worse over past few days. He has been wheezing and more Esias Mory of breath.  Family checks blood pressure on a regular basis and adjusts medications frequently.  Had labile blood pressures, both high and low.  Assessment & Plan:   Active Problems:   Asthma with COPD ([place])   Obstructive sleep apnea   Chronic diastolic heart failure (HCC)   PAF (paroxysmal atrial fibrillation) (HCC)   Accelerated hypertension   CAD S/P S-PDA DES 2012   Tachycardia   Chest pain   PAD (peripheral artery disease) (HCC)   Hyponatremia   Essential hypertension   COPD (chronic obstructive pulmonary disease) (HCC)   COPD exacerbation (HCC)  Labile blood pressures, improving after stopping clonidine but still high.  Heart rate improved. -  Change to long acting diltiazem  -  Continue bisoprolol -  Increase spironolactone -  Continue ARB  Sinus tachycardia, improved -  TSH wnl -  D-dimer, negative -  Okay to resume beta agonists/anticholinergics (xopenex)  Dyspnea likely due to acute COPD exacerbation with increased wheeze and dyspnea today.  May have some mild acute on chronic diastolic heart failure.   -  continue steroids -  Continue doxycycline -  Change to budesonide and brovana until breathing more comfortably -  Resume duonebs -  Continue BID PPI -  ECHO:  Diastolic heart failure (consistent with history of uncontrolled hypertension) -  CXR:  Appears to have some vascular congestion to my eye, although radiologist reports it looks stable -  Start torsemide 40mg  BID -  Daily weights and strict I/O  Paroxysmal atrial  fibrillation, CHADs2vasc = 4, sinus rhythm on tele. -  Continue eliquis -  Cont bisoprolol  -  Continue dilt  OSA, offer CPAP  Hyponatremia, resolved with IVF  CAD with episode of chest pain on morning of admission -  ECG unchanged -  troponins negative -  Suspect chest tightness is related to copd -  No arrhythmias on telemetry at time of event  PAD, stable, continue eliquis  Chronic diastolic heart failure -  Monitor for signs of hypervolemia  Anxiety -  Continue scheduled ativan   DVT prophylaxis:  eliquis Code Status:  Full Family Communication:  Patient and wife at bedside Disposition Plan:  Probably home tomorrow if breathing improves  Consultants:   none  Procedures:  ECHO pending  Antimicrobials:   Doxycycline 6/15   Subjective:  Heart rate and blood pressure have come down.  worsening [name] of breath and wheeze.  Constipation resolved. Objective: Filed Vitals:   09/25/15 1000 09/25/15 1148 09/25/15 1159 09/25/15 1340  BP:    144/52  Pulse:    62  Temp:    98.2 F (36.8 C)  TempSrc:    Oral  Resp:    18  Height:      Weight:      SpO2: 95% 95% 95% 96%    Intake/Output Summary (Last 24 hours) at 09/25/15 1514 Last data filed at 09/25/15 0900  Gross per 24 hour  Intake    723 ml  Output      0 ml  Net  723 ml   Filed Weights   09/23/15 1345 09/24/15 0500 09/25/15 0535  Weight: 63.866 kg (140 lb 12.8 oz) 64.003 kg (141 lb 1.6 oz) 64.411 kg (142 lb)    Examination:  General exam:  Adult male.  SCM retractions and tachypnea today HEENT:  NCAT, MMM Respiratory system:  Diminished bilateral breath sounds, prominent wheeze.  Persistent prolonged exp phase Cardiovascular system:  RRR normal S1/S2. No murmurs, rubs, gallops or clicks.  Warm extremities Gastrointestinal system: Normal active bowel sounds, soft, nondistended, nontender. MSK:  Normal tone and bulk, no lower extremity edema Neuro:  Grossly intact    Data Reviewed: I have  personally reviewed following labs and imaging studies  CBC:  Recent Labs Lab 09/20/15 1945 09/22/15 1933 09/23/15 0540 09/23/15 1140 09/24/15 0445  WBC 10.4* 7.6 5.2 6.2 5.9  NEUTROABS  --  4.5  --   --   --   HGB 15.6 13.3 18.2* 13.0 13.3  HCT 42.8* 39.4 52.7* 38.9* 39.9  MCV 83.6 83.5 84.9 84.2 85.1  PLT  --  181 94* 185 [id number]   Basic Metabolic Panel:  Recent Labs Lab 09/20/15 1955 09/22/15 1933 09/23/15 0540 09/24/15 0445  NA 132* 131* 135 135  K 3.9 3.6 3.2* 3.5  CL 97* 94* 97* 101  CO2 26 25 28 25   GLUCOSE 112* 122* 100* 207*  BUN 16 22* 16 25*  CREATININE 0.89 1.07 0.99 1.19  CALCIUM 9.5 9.2 9.1 9.3  MG  --   --  2.0  --   PHOS  --   --  3.6  --    GFR: Estimated Creatinine Clearance: 37 mL/min (by C-G formula based on Cr of 1.19). Liver Function Tests:  Recent Labs Lab 09/22/15 1933 09/23/15 0540  AST 37 23  ALT 21 19  ALKPHOS 72 72  BILITOT 1.4* 1.1  PROT 6.1* 6.3*  ALBUMIN 3.6 3.5   No results for input(s): LIPASE, AMYLASE in the last 168 hours. No results for input(s): AMMONIA in the last 168 hours. Coagulation Profile: No results for input(s): INR, PROTIME in the last 168 hours. Cardiac Enzymes:  Recent Labs Lab 09/22/15 1933 09/23/15 0540 09/23/15 1140 09/23/15 1625  TROPONINI <0.03 <0.03 <0.03 0.03   BNP (last 3 results) No results for input(s): PROBNP in the last 8760 hours. HbA1C:  Recent Labs  09/23/15 0541  HGBA1C 5.9*   CBG: No results for input(s): GLUCAP in the last 168 hours. Lipid Profile: No results for input(s): CHOL, HDL, LDLCALC, TRIG, CHOLHDL, LDLDIRECT in the last 72 hours. Thyroid Function Tests:  Recent Labs  09/23/15 0540  TSH 1.179   Anemia Panel: No results for input(s): VITAMINB12, FOLATE, FERRITIN, TIBC, IRON, RETICCTPCT in the last 72 hours. Urine analysis:    Component Value Date/Time   COLORURINE YELLOW 09/23/2015 0605   APPEARANCEUR CLEAR 09/23/2015 0605   LABSPEC 1.013 09/23/2015 0605     PHURINE 6.0 09/23/2015 0605   GLUCOSEU NEGATIVE 09/23/2015 0605   GLUCOSEU NEGATIVE 06/19/2014 0941   HGBUR MODERATE* 09/23/2015 0605   BILIRUBINUR NEGATIVE 09/23/2015 0605   BILIRUBINUR negative 09/20/2015 1946   KETONESUR NEGATIVE 09/23/2015 0605   KETONESUR negative 09/20/2015 1946   PROTEINUR NEGATIVE 09/23/2015 0605   PROTEINUR =100* 09/20/2015 1946   UROBILINOGEN 0.2 09/20/2015 1946   UROBILINOGEN 0.2 09/17/2014 2215   NITRITE NEGATIVE 09/23/2015 0605   NITRITE Negative 09/20/2015 1946   LEUKOCYTESUR NEGATIVE 09/23/2015 0605   Sepsis Labs: @LABRCNTIP (procalcitonin:4,lacticidven:4)  ) Recent Results (from the past  240 hour(s))  Urine culture     Status: Abnormal   Collection Time: 09/23/15  6:05 AM  Result Value Ref Range Status   Specimen Description URINE, RANDOM  Final   Special Requests Normal  Final   Culture MULTIPLE SPECIES PRESENT, SUGGEST RECOLLECTION (A)  Final   Report Status 09/24/2015 FINAL  Final      Radiology Studies: Dg Chest Port 1 View  09/25/2015  CLINICAL DATA:  Dyspnea. EXAM: PORTABLE CHEST 1 VIEW COMPARISON:  Radiograph of September 22, 2015. FINDINGS: The heart size and mediastinal contours are within normal limits. No pneumothorax or pleural effusion is noted. Status post coronary artery bypass graft. Both lungs are clear. The visualized skeletal structures are unremarkable. IMPRESSION: No acute cardiopulmonary abnormality seen. Electronically Signed   By: Marijo Conception, M.D.   On: 09/25/2015 09:51     Scheduled Meds: . apixaban  5 mg Oral BID  . arformoterol  15 mcg Nebulization BID  . bisoprolol  20 mg Oral Daily  . budesonide (PULMICORT) nebulizer solution  0.5 mg Nebulization BID  . clopidogrel  75 mg Oral Q breakfast  . diltiazem  240 mg Oral Daily  . docusate sodium  100 mg Oral BID  . doxycycline  100 mg Oral Q12H  . guaiFENesin  600 mg Oral BID  . ipratropium-albuterol  3 mL Nebulization QID  . irbesartan  300 mg Oral Daily  .  isosorbide mononitrate  60 mg Oral BID  . latanoprost  1 drop Both Eyes QHS  . LORazepam  0.5 mg Oral TID  . LORazepam  0.5 mg Oral Once  . montelukast  10 mg Oral QHS  . pantoprazole  40 mg Oral BID  . pravastatin  80 mg Oral q morning - 10a  . predniSONE  40 mg Oral QAC breakfast  . sodium chloride  500 mL Intravenous Once  . sodium chloride flush  3 mL Intravenous Q12H  . [START ON 09/26/2015] spironolactone  50 mg Oral Daily  . torsemide  40 mg Oral BID   Continuous Infusions:    LOS: 2 days    Time spent: 30 min    Janece Canterbury, MD [facility] Pager [phone]  If 7PM-7AM, please contact night-coverage [email or web address] Password [facility] 09/25/2015, 3:14 PM

## 2015-09-25 NOTE — Progress Notes (Signed)
Patient walked 200 feet on room air. o2 SATmAINTAINED FROM 92-95 %, rESP RATE WENT UP TO 47. PATIENT FELT SHORT OF BREATH. Heart rate stayed between 69- 84 during ambulating. Will continue to monitor.

## 2015-09-26 DIAGNOSIS — I2510 Atherosclerotic heart disease of native coronary artery without angina pectoris: Secondary | ICD-10-CM | POA: Diagnosis not present

## 2015-09-26 DIAGNOSIS — I10 Essential (primary) hypertension: Secondary | ICD-10-CM | POA: Diagnosis not present

## 2015-09-26 DIAGNOSIS — I5032 Chronic diastolic (congestive) heart failure: Secondary | ICD-10-CM | POA: Diagnosis not present

## 2015-09-26 DIAGNOSIS — J441 Chronic obstructive pulmonary disease with (acute) exacerbation: Secondary | ICD-10-CM | POA: Diagnosis not present

## 2015-09-26 LAB — BASIC METABOLIC PANEL
ANION GAP: 10 (ref 5–15)
BUN: 28 mg/dL — ABNORMAL HIGH (ref 6–20)
CHLORIDE: 97 mmol/L — AB (ref 101–111)
CO2: 28 mmol/L (ref 22–32)
Calcium: 9 mg/dL (ref 8.9–10.3)
Creatinine, Ser: 1.13 mg/dL (ref 0.61–1.24)
GFR calc non Af Amer: 59 mL/min — ABNORMAL LOW (ref >60)
Glucose, Bld: 116 mg/dL — ABNORMAL HIGH (ref 65–99)
POTASSIUM: 3.1 mmol/L — AB (ref 3.5–5.1)
SODIUM: 135 mmol/L (ref 135–145)

## 2015-09-26 MED ORDER — PREDNISONE 20 MG PO TABS: 40.0000 mg | ORAL_TABLET | Freq: Every day | ORAL | Status: DC

## 2015-09-26 MED ORDER — DOXYCYCLINE HYCLATE 100 MG PO TABS: 100.0000 mg | ORAL_TABLET | Freq: Two times a day (BID) | ORAL | Status: DC

## 2015-09-26 MED ORDER — DILTIAZEM HCL ER COATED BEADS 240 MG PO CP24: 240.0000 mg | ORAL_CAPSULE | Freq: Every day | ORAL | Status: DC

## 2015-09-26 MED ORDER — SPIRONOLACTONE 50 MG PO TABS: 50.0000 mg | ORAL_TABLET | Freq: Every day | ORAL | Status: DC

## 2015-09-26 MED ORDER — IPRATROPIUM-ALBUTEROL 0.5-2.5 (3) MG/3ML IN SOLN
3.0000 mL | Freq: Two times a day (BID) | RESPIRATORY_TRACT | Status: DC
  Administered 2015-09-26: 3 mL via RESPIRATORY_TRACT
  Filled 2015-09-26 (×2): qty 3

## 2015-09-26 MED ORDER — BISOPROLOL FUMARATE 10 MG PO TABS: 20.0000 mg | ORAL_TABLET | Freq: Every day | ORAL | Status: DC

## 2015-09-26 NOTE — Progress Notes (Signed)
Patient being discharged home per Md order. All discharge instructions given to patient verbally. A copy was also sent home with patient.

## 2015-09-26 NOTE — Discharge Summary (Signed)
Physician Discharge Summary  [name]. [id number] DOB: 1930-09-14 DOA: 09/22/2015  PCP: Binnie Rail, MD  Admit date: 09/22/2015 Discharge date: 09/26/2015  Recommendations for Outpatient Follow-up:  1.  Patient and wife have been obsessing over blood pressures, checking 15 times per day and repeatedly waking up in the middle of the night to check.  Told them to stop checking blood pressures and give away their blood pressure cuffs. 2.  Follow up with PCP in 1 week for COPD exac and for heart rate/blood pressure check  Discharge Diagnoses:  Principal Problem:   COPD exacerbation ([place]) Active Problems:   Asthma with COPD ([place])   Obstructive sleep apnea   Chronic diastolic heart failure ([facility])   PAF (paroxysmal atrial fibrillation) ([facility])   Accelerated hypertension   CAD S/P S-PDA DES 2012   Tachycardia   Chest pain   PAD (peripheral artery disease) ([facility])   Hyponatremia   Essential hypertension   COPD (chronic obstructive pulmonary disease) ([place])   Discharge Condition: stable, improved  Diet recommendation: healthy heart  Wt Readings from Last 3 Encounters:  09/26/15 65 kg (143 lb 4.8 oz)  09/08/15 64.864 kg (143 lb)  07/18/15 66.679 kg (147 lb)    History of present illness:   [name]. is a 82 y.o. male with medical history significant of CAD status post CABG, OSA, HTN, PVD,Subclavian stenosis, sinus post PCI recurrent hyponatremia intermittent tachycardia. Presented with 2 week hx of elevated blood pressure and HR worse over past few days. He has been wheezing and more [place] of breath. Family checks blood pressure on a regular basis and adjusts medications frequently. Had labile blood pressures, both high and low.  Hospital Course:   Labile blood pressures, went from 200s/100s to [id number] systolic after taking clonidine.  Swings in BP resolved after stopping clonidine.  Blood pressure was high but gradually trended down with medications below.   Prednisone also likely effecting blood pressure currently.   -  Stopped carvedilol due to increased wheezing.   -  Stopped clonidine due to blood pressure swings - started long acting diltiazem - started bisoprolol - started spironolactone - Continued ARB  Paroxysmal atrial fibrillation with sinus tachycardia, improved on diltiazem.  CHADs2vasc = 4 - TSH wnl - D-dimer, negative - tolerating beta agonists/anticholinergics (xopenex) at time of discharge without significant tachycardia - Continued eliquis - started bisoprolol and diltiazem -  HR in 50s-60s at time of discharge  Dyspnea likely due to acute COPD exacerbation. May have some mild acute on chronic diastolic heart failure. ECHO: Diastolic heart failure (consistent with history of uncontrolled hypertension) - started prednisone burst with doxycycline - given budesonide and brovana in hospital but may resume dulera at discharge -  Advised duonebs four times daily until he sees his PCP later this week - CXR: Appears to have some vascular congestion to my eye, although radiologist reports it looks stable - Given torsemide 40mg  for a few doses -  Resume torsemide 20mg  at discharge  OSA, resume CPAP  Hyponatremia, resolved with IVF  CAD with episode of chest pain on morning of admission - ECG unchanged - troponins negative - Suspect chest tightness is related to copd - No arrhythmias on telemetry at time of event  PAD, stable, continue eliquis  Anxiety, uncontrolled, continued ativan and defer to PCP  Consultants:   none  Procedures:  ECHO   Antimicrobials:   Doxycycline 6/15 Discharge Exam: Filed Vitals:   09/26/15 0500 09/26/15 [id number]  BP: 146/67 171/75  Pulse: 70   Temp: 98.2 F (36.8 C)   Resp: 18 23   Filed Vitals:   09/25/15 2006 09/25/15 2047 09/26/15 0500 09/26/15 0822  BP:  164/52 146/67 171/75  Pulse:  63 70   Temp:  98 F (36.7 C) 98.2 F (36.8 C)   TempSrc:  Oral Oral  Oral  Resp:  15 18 23   Height:      Weight:   65 kg (143 lb 4.8 oz)   SpO2: 97% 97% 95% 95%    General exam: Adult male, NAD HEENT: NCAT, MMM Respiratory system: improved aeration, wheeze greatly improved, no focal rales.  Cardiovascular system: RRR normal S1/S2. No murmurs, rubs, gallops or clicks. Warm extremities Gastrointestinal system: Normal active bowel sounds, soft, nondistended, nontender. MSK: Normal tone and bulk, no lower extremity edema Neuro: Grossly intact  Discharge Instructions      Discharge Instructions    Call MD for:  difficulty breathing, headache or visual disturbances    Complete by:  As directed      Call MD for:  extreme fatigue    Complete by:  As directed      Call MD for:  hives    Complete by:  As directed      Call MD for:  persistant dizziness or light-headedness    Complete by:  As directed      Call MD for:  persistant nausea and vomiting    Complete by:  As directed      Call MD for:  severe uncontrolled pain    Complete by:  As directed      Call MD for:  temperature >100.4    Complete by:  As directed      Diet - low sodium heart healthy    Complete by:  As directed      Discharge instructions    Complete by:  As directed   Your blood pressures will continue to gradually trend down over the next week.  Your blood pressures are no longer swinging from high to low.  You do not need to check your blood pressures at home for now.  Let your doctor check them at your next visit.  Your heart rate has been much better.  Please continue to take prednisone and doxycycline for your breathing for the next three days.  Do not go into the sun for the next two weeks because you can get a chemical burn from the doxycycline.  Take your duoneb treatments four times a day until you see your primary care doctor late this week.     Increase activity slowly    Complete by:  As directed             Medication List    STOP taking these medications         amLODipine 2.5 MG tablet  Commonly known as:  NORVASC     carvedilol 25 MG tablet  Commonly known as:  COREG     cloNIDine 0.3 MG tablet  Commonly known as:  CATAPRES      TAKE these medications        acetaminophen 325 MG tablet  Commonly known as:  TYLENOL  Take 650 mg by mouth every 6 (six) hours as needed for moderate pain or headache.     albuterol 108 (90 Base) MCG/ACT inhaler  Commonly known as:  PROAIR HFA  Inhale 2 puffs into the lungs every 6 (six) hours as  needed for wheezing or shortness of breath.     apixaban 5 MG Tabs tablet  Commonly known as:  ELIQUIS  Take 1 tablet (5 mg total) by mouth 2 (two) times daily.     bisoprolol 10 MG tablet  Commonly known as:  ZEBETA  Take 2 tablets (20 mg total) by mouth daily.     calcium-vitamin D 500-200 MG-UNIT tablet  Commonly known as:  OSCAL WITH D  Take 1 tablet by mouth 2 (two) times daily.     cholestyramine 4 g packet  Commonly known as:  QUESTRAN  MIX AND DRINK 1 PACKET BY MOUTH TWICE A DAY IF NEEDED FOR LOOSE STOOLS     clopidogrel 75 MG tablet  Commonly known as:  PLAVIX  Take 1 tablet (75 mg total) by mouth daily with breakfast.     diltiazem 240 MG 24 hr capsule  Commonly known as:  CARDIZEM CD  Take 1 capsule (240 mg total) by mouth daily.     doxycycline 100 MG tablet  Commonly known as:  VIBRA-TABS  Take 1 tablet (100 mg total) by mouth every 12 (twelve) hours.     ipratropium 0.02 % nebulizer solution  Commonly known as:  ATROVENT  Take 2.5 mLs (0.5 mg total) by nebulization every 8 (eight) hours.     ipratropium-albuterol 0.5-2.5 (3) MG/3ML Soln  Commonly known as:  DUONEB  USE ONE VIAL VIA NEBULIZER EVERY 6 HOURS AS NEEDED FOR SHORTNESS OF BREATH     isosorbide mononitrate 60 MG 24 hr tablet  Commonly known as:  IMDUR  Take 1 tablet (60 mg total) by mouth 2 (two) times daily.     latanoprost 0.005 % ophthalmic solution  Commonly known as:  XALATAN  Place 1 drop into both eyes at  bedtime.     LORazepam 0.5 MG tablet  Commonly known as:  ATIVAN  Take 1 tablet (0.5 mg total) by mouth 3 (three) times daily.     mometasone-formoterol 100-5 MCG/ACT Aero  Commonly known as:  DULERA  inhale 2 puffs by mouth once daily (THEN RINSE MOUTH AFTER USE)     montelukast 10 MG tablet  Commonly known as:  SINGULAIR  Take 1 tablet (10 mg total) by mouth at bedtime.     nitroGLYCERIN 0.4 MG SL tablet  Commonly known as:  NITROSTAT  Place 1 tablet (0.4 mg total) under the tongue every 5 (five) minutes as needed for chest pain.     pantoprazole 40 MG tablet  Commonly known as:  PROTONIX  take 1 tablet by mouth twice a day     pravastatin 80 MG tablet  Commonly known as:  PRAVACHOL  Take 1 tablet (80 mg total) by mouth every morning.     predniSONE 20 MG tablet  Commonly known as:  DELTASONE  Take 2 tablets (40 mg total) by mouth daily before breakfast.     PROBIOTIC DAILY Caps  Take 1 capsule by mouth at bedtime.     RA VITAMIN B-12 TR 1000 MCG Tbcr  Generic drug:  Cyanocobalamin  take 1 tablet by mouth once daily     spironolactone 50 MG tablet  Commonly known as:  ALDACTONE  Take 1 tablet (50 mg total) by mouth daily.     torsemide 20 MG tablet  Commonly known as:  DEMADEX  Take 20 mg by mouth daily.     traZODone 50 MG tablet  Commonly known as:  DESYREL  Take 0.5-1 tablets (25-50 mg  total) by mouth at bedtime as needed for sleep.     valsartan 320 MG tablet  Commonly known as:  DIOVAN  Take 1 tablet (320 mg total) by mouth daily.       Follow-up Information    Follow up with Binnie Rail, MD. Schedule an appointment as soon as possible for a visit in 1 week.   Specialty:  Internal Medicine   Contact information:   [address] [place] [id number] [phone]        The results of significant diagnostics from this hospitalization (including imaging, microbiology, ancillary and laboratory) are listed below for reference.    Significant  Diagnostic Studies: Dg Chest 2 View  09/22/2015  CLINICAL DATA:  Shortness of breath, wheezing, tachycardia EXAM: CHEST  2 VIEW COMPARISON:  05/17/2015 FINDINGS: Cardiomediastinal silhouette is stable. Status post CABG. No acute infiltrate or pleural effusion. No pulmonary edema. Osteopenia and mild degenerative changes thoracic spine. IMPRESSION: No active cardiopulmonary disease. Electronically Signed   By: Lahoma Crocker M.D.   On: 09/22/2015 20:05   Dg Chest Port 1 View  09/25/2015  CLINICAL DATA:  Dyspnea. EXAM: PORTABLE CHEST 1 VIEW COMPARISON:  Radiograph of September 22, 2015. FINDINGS: The heart size and mediastinal contours are within normal limits. No pneumothorax or pleural effusion is noted. Status post coronary artery bypass graft. Both lungs are clear. The visualized skeletal structures are unremarkable. IMPRESSION: No acute cardiopulmonary abnormality seen. Electronically Signed   By: Marijo Conception, M.D.   On: 09/25/2015 09:51    Microbiology: Recent Results (from the past 240 hour(s))  Urine culture     Status: Abnormal   Collection Time: 09/23/15  6:05 AM  Result Value Ref Range Status   Specimen Description URINE, RANDOM  Final   Special Requests Normal  Final   Culture MULTIPLE SPECIES PRESENT, SUGGEST RECOLLECTION (A)  Final   Report Status 09/24/2015 FINAL  Final     Labs: Basic Metabolic Panel:  Recent Labs Lab 09/20/15 1955 09/22/15 1933 09/23/15 0540 09/24/15 0445 09/26/15 0302  NA 132* 131* 135 135 135  K 3.9 3.6 3.2* 3.5 3.1*  CL 97* 94* 97* 101 97*  CO2 26 25 28 25 28   GLUCOSE 112* 122* 100* 207* 116*  BUN 16 22* 16 25* 28*  CREATININE 0.89 1.07 0.99 1.19 1.13  CALCIUM 9.5 9.2 9.1 9.3 9.0  MG  --   --  2.0  --   --   PHOS  --   --  3.6  --   --    Liver Function Tests:  Recent Labs Lab 09/22/15 1933 09/23/15 0540  AST 37 23  ALT 21 19  ALKPHOS 72 72  BILITOT 1.4* 1.1  PROT 6.1* 6.3*  ALBUMIN 3.6 3.5   No results for input(s): LIPASE, AMYLASE  in the last 168 hours. No results for input(s): AMMONIA in the last 168 hours. CBC:  Recent Labs Lab 09/20/15 1945 09/22/15 1933 09/23/15 0540 09/23/15 1140 09/24/15 0445  WBC 10.4* 7.6 5.2 6.2 5.9  NEUTROABS  --  4.5  --   --   --   HGB 15.6 13.3 18.2* 13.0 13.3  HCT 42.8* 39.4 52.7* 38.9* 39.9  MCV 83.6 83.5 84.9 84.2 85.1  PLT  --  181 94* 185 190   Cardiac Enzymes:  Recent Labs Lab 09/22/15 1933 09/23/15 0540 09/23/15 1140 09/23/15 1625  TROPONINI <0.03 <0.03 <0.03 0.03   BNP: BNP (last 3 results)  Recent Labs  05/18/15 0302 09/22/15 1933 09/25/15 0953  BNP 357.4* 69.2 345.5*    ProBNP (last 3 results) No results for input(s): PROBNP in the last 8760 hours.  CBG: No results for input(s): GLUCAP in the last 168 hours.  Time coordinating discharge: 35 minutes  Signed:  Sadiel Mota  [facility] 09/26/2015, 3:10 PM

## 2015-09-26 NOTE — Progress Notes (Signed)
Patient ambulated in the halls 200 ft, Resp sats maintained around 95%, Pt stated he felt a little short of breath, but better then he did on Saturday walking. Will continue to monitor.

## 2015-09-27 ENCOUNTER — Telehealth: Payer: Self-pay | Admitting: Cardiology

## 2015-09-27 MED ORDER — CLOPIDOGREL BISULFATE 75 MG PO TABS: 75.0000 mg | ORAL_TABLET | Freq: Every day | ORAL | Status: DC

## 2015-09-27 NOTE — Telephone Encounter (Signed)
rx refilled.

## 2015-09-27 NOTE — Telephone Encounter (Signed)
New message      *STAT* If patient is at the pharmacy, call can be transferred to refill team.   1. Which medications need to be refilled? (please list name of each medication and dose if known) Plavix  75 mg po daily  2. Which pharmacy/location (including street and city if local pharmacy) is medication to be sent to?[facility] on [address]  3. Do they need a 30 day or 90 day supply? 30 day

## 2015-09-28 ENCOUNTER — Ambulatory Visit: Payer: Medicare Other

## 2015-09-28 ENCOUNTER — Ambulatory Visit: Payer: Medicare Other | Admitting: Physician Assistant

## 2015-10-01 DIAGNOSIS — R2681 Unsteadiness on feet: Secondary | ICD-10-CM | POA: Diagnosis not present

## 2015-10-01 DIAGNOSIS — I639 Cerebral infarction, unspecified: Secondary | ICD-10-CM | POA: Diagnosis not present

## 2015-10-01 DIAGNOSIS — M6281 Muscle weakness (generalized): Secondary | ICD-10-CM | POA: Diagnosis not present

## 2015-10-01 DIAGNOSIS — R262 Difficulty in walking, not elsewhere classified: Secondary | ICD-10-CM | POA: Diagnosis not present

## 2015-10-05 ENCOUNTER — Ambulatory Visit (INDEPENDENT_AMBULATORY_CARE_PROVIDER_SITE_OTHER): Payer: Medicare Other | Admitting: Internal Medicine

## 2015-10-05 ENCOUNTER — Encounter: Payer: Self-pay | Admitting: Internal Medicine

## 2015-10-05 VITALS — BP 120/70 | HR 53 | Temp 97.6°F | Wt 148.0 lb

## 2015-10-05 DIAGNOSIS — I10 Essential (primary) hypertension: Secondary | ICD-10-CM | POA: Diagnosis not present

## 2015-10-05 DIAGNOSIS — Z5189 Encounter for other specified aftercare: Secondary | ICD-10-CM

## 2015-10-05 DIAGNOSIS — J441 Chronic obstructive pulmonary disease with (acute) exacerbation: Secondary | ICD-10-CM

## 2015-10-05 DIAGNOSIS — I480 Paroxysmal atrial fibrillation: Secondary | ICD-10-CM

## 2015-10-05 DIAGNOSIS — G9340 Encephalopathy, unspecified: Secondary | ICD-10-CM

## 2015-10-05 MED ORDER — PREDNISONE 10 MG PO TABS: ORAL_TABLET | ORAL | Status: DC

## 2015-10-05 MED ORDER — METHYLPREDNISOLONE ACETATE 80 MG/ML IJ SUSP
80.0000 mg | Freq: Once | INTRAMUSCULAR | Status: AC
  Administered 2015-10-05: 80 mg via INTRAMUSCULAR

## 2015-10-05 NOTE — Assessment & Plan Note (Signed)
Improving O2 sats are ok

## 2015-10-05 NOTE — Assessment & Plan Note (Addendum)
6/17  Updated meds reviewed

## 2015-10-05 NOTE — Assessment & Plan Note (Addendum)
Prednisone 10 mg: take 4 tabs a day x 3 days; then 3 tabs a day x 4 days; then 2 tabs a day x 4 days, then 1 tab a day x 6 days, then stop. Take pc. IM Depomedrol HHN qid CXR was ok [place]

## 2015-10-05 NOTE — Progress Notes (Signed)
Subjective:  Patient ID: Stephen Door., male    DOB: 12-Feb-1931  Age: 82 y.o. MRN: [id number]  CC: No chief complaint on file.   HPI Stephen Tumolo. presents for a post-hosp visit d/c'd on 6/18. He was hospitalized with COPD exacerbation and a very high BP 200's/100's. Hospital records reviewed. He was confused yesterday am, coughing, SOB. C/o nausea, weakness. He finished the abx  Outpatient Prescriptions Prior to Visit  Medication Sig Dispense Refill  . acetaminophen (TYLENOL) 325 MG tablet Take 650 mg by mouth every 6 (six) hours as needed for moderate pain or headache.     . albuterol (PROAIR HFA) 108 (90 Base) MCG/ACT inhaler Inhale 2 puffs into the lungs every 6 (six) hours as needed for wheezing or shortness of breath. 1 Inhaler 12  . apixaban (ELIQUIS) 5 MG TABS tablet Take 1 tablet (5 mg total) by mouth 2 (two) times daily. 60 tablet 11  . bisoprolol (ZEBETA) 10 MG tablet Take 2 tablets (20 mg total) by mouth daily. 60 tablet 0  . calcium-vitamin D (OSCAL WITH D) 500-200 MG-UNIT per tablet Take 1 tablet by mouth 2 (two) times daily.    . cholestyramine (QUESTRAN) 4 g packet MIX AND DRINK 1 PACKET BY MOUTH TWICE A DAY IF NEEDED FOR LOOSE STOOLS 60 packet 2  . clopidogrel (PLAVIX) 75 MG tablet Take 1 tablet (75 mg total) by mouth daily with breakfast. 30 tablet 8  . diltiazem (CARDIZEM CD) 240 MG 24 hr capsule Take 1 capsule (240 mg total) by mouth daily. 30 capsule 0  . doxycycline (VIBRA-TABS) 100 MG tablet Take 1 tablet (100 mg total) by mouth every 12 (twelve) hours. 6 tablet 0  . ipratropium (ATROVENT) 0.02 % nebulizer solution Take 2.5 mLs (0.5 mg total) by nebulization every 8 (eight) hours. 75 mL 12  . ipratropium-albuterol (DUONEB) 0.5-2.5 (3) MG/3ML SOLN USE ONE VIAL VIA NEBULIZER EVERY 6 HOURS AS NEEDED FOR SHORTNESS OF BREATH 360 mL 5  . isosorbide mononitrate (IMDUR) 60 MG 24 hr tablet Take 1 tablet (60 mg total) by mouth 2 (two) times daily. 180 tablet 0  .  latanoprost (XALATAN) 0.005 % ophthalmic solution Place 1 drop into both eyes at bedtime.     Marland Kitchen LORazepam (ATIVAN) 0.5 MG tablet Take 1 tablet (0.5 mg total) by mouth 3 (three) times daily. 90 tablet 2  . mometasone-formoterol (DULERA) 100-5 MCG/ACT AERO inhale 2 puffs by mouth once daily (THEN RINSE MOUTH AFTER USE) 17.6 g 5  . montelukast (SINGULAIR) 10 MG tablet Take 1 tablet (10 mg total) by mouth at bedtime. 90 tablet 3  . nitroGLYCERIN (NITROSTAT) 0.4 MG SL tablet Place 1 tablet (0.4 mg total) under the tongue every 5 (five) minutes as needed for chest pain. 25 tablet 0  . pantoprazole (PROTONIX) 40 MG tablet take 1 tablet by mouth twice a day (Patient taking differently: take 40 mg by mouth twice a day) 60 tablet 11  . pravastatin (PRAVACHOL) 80 MG tablet Take 1 tablet (80 mg total) by mouth every morning. 30 tablet 2  . predniSONE (DELTASONE) 20 MG tablet Take 2 tablets (40 mg total) by mouth daily before breakfast. 6 tablet 0  . Probiotic Product (PROBIOTIC DAILY) CAPS Take 1 capsule by mouth at bedtime.    Marland Kitchen RA VITAMIN B-12 TR 1000 MCG TBCR take 1 tablet by mouth once daily (Patient taking differently: take 1000 mcg by mouth once daily) 30 tablet 11  . spironolactone (ALDACTONE) 50  MG tablet Take 1 tablet (50 mg total) by mouth daily. 30 tablet 0  . torsemide (DEMADEX) 20 MG tablet Take 20 mg by mouth daily.    . traZODone (DESYREL) 50 MG tablet Take 0.5-1 tablets (25-50 mg total) by mouth at bedtime as needed for sleep. 30 tablet 3  . valsartan (DIOVAN) 320 MG tablet Take 1 tablet (320 mg total) by mouth daily. 90 tablet 3   No facility-administered medications prior to visit.    ROS Review of Systems  Constitutional: Positive for fatigue. Negative for fever, appetite change and unexpected weight change.  HENT: Negative for congestion, nosebleeds, sneezing, sore throat and trouble swallowing.   Eyes: Negative for itching and visual disturbance.  Respiratory: Positive for cough and  shortness of breath.   Cardiovascular: Negative for chest pain, palpitations and leg swelling.  Gastrointestinal: Positive for nausea. Negative for diarrhea, blood in stool and abdominal distention.  Genitourinary: Negative for frequency, hematuria and decreased urine volume.  Musculoskeletal: Negative for back pain, joint swelling, gait problem and neck pain.  Skin: Negative for rash.  Neurological: Negative for dizziness, tremors, speech difficulty and weakness.  Psychiatric/Behavioral: Positive for decreased concentration. Negative for sleep disturbance, dysphoric mood and agitation. The patient is not nervous/anxious.     Objective:  BP 160/88 mmHg  Pulse 53  Temp(Src) 97.6 F (36.4 C) (Oral)  Wt 148 lb (67.132 kg)  SpO2 97%  BP Readings from Last 3 Encounters:  10/05/15 160/88  09/26/15 171/75  09/20/15 182/100    Wt Readings from Last 3 Encounters:  10/05/15 148 lb (67.132 kg)  09/26/15 143 lb 4.8 oz (65 kg)  09/08/15 143 lb (64.864 kg)    Physical Exam  Constitutional: He is oriented to person, place, and time. He appears well-developed. No distress.  NAD  HENT:  Mouth/Throat: Oropharynx is clear and moist.  Eyes: Conjunctivae are normal. Pupils are equal, round, and reactive to light.  Neck: Normal range of motion. No JVD present. No thyromegaly present.  Cardiovascular: Normal rate, regular rhythm and intact distal pulses.  Exam reveals no gallop and no friction rub.   Murmur heard. Pulmonary/Chest: Effort normal. No respiratory distress. He has wheezes. He has no rales. He exhibits no tenderness.  Abdominal: Soft. Bowel sounds are normal. He exhibits no distension and no mass. There is no tenderness. There is no rebound and no guarding.  Musculoskeletal: Normal range of motion. He exhibits no edema or tenderness.  Lymphadenopathy:    He has no cervical adenopathy.  Neurological: He is alert and oriented to person, place, and time. He has normal reflexes. No  cranial nerve deficit. He exhibits normal muscle tone. He displays a negative Romberg sign. Coordination and gait normal.  Skin: Skin is warm and dry. No rash noted.  Psychiatric: He has a normal mood and affect. His behavior is normal. Judgment and thought content normal.    Lab Results  Component Value Date   WBC 5.9 09/24/2015   HGB 13.3 09/24/2015   HCT 39.9 09/24/2015   PLT 190 09/24/2015   GLUCOSE 116* 09/26/2015   CHOL 128 06/02/2015   TRIG 95.0 06/02/2015   HDL 37.00* 06/02/2015   LDLCALC 72 06/02/2015   ALT 19 09/23/2015   AST 23 09/23/2015   NA 135 09/26/2015   K 3.1* 09/26/2015   CL 97* 09/26/2015   CREATININE 1.13 09/26/2015   BUN 28* 09/26/2015   CO2 28 09/26/2015   TSH 1.179 09/23/2015   PSA 0.62 10/10/2010  INR 1.11 05/17/2015   HGBA1C 5.9* 09/23/2015    Dg Chest 2 View  09/22/2015  CLINICAL DATA:  Shortness of breath, wheezing, tachycardia EXAM: CHEST  2 VIEW COMPARISON:  05/17/2015 FINDINGS: Cardiomediastinal silhouette is stable. Status post CABG. No acute infiltrate or pleural effusion. No pulmonary edema. Osteopenia and mild degenerative changes thoracic spine. IMPRESSION: No active cardiopulmonary disease. Electronically Signed   By: Lahoma Crocker M.D.   On: 09/22/2015 20:05    Assessment & Plan:   There are no diagnoses linked to this encounter. I am having [name] maintain his latanoprost, calcium-vitamin D, RA VITAMIN B-12 TR, nitroGLYCERIN, montelukast, ipratropium-albuterol, pantoprazole, acetaminophen, PROBIOTIC DAILY, torsemide, isosorbide mononitrate, apixaban, cholestyramine, ipratropium, pravastatin, valsartan, albuterol, mometasone-formoterol, traZODone, LORazepam, bisoprolol, diltiazem, doxycycline, predniSONE, spironolactone, and clopidogrel.  No orders of the defined types were placed in this encounter.     Follow-up: No Follow-up on file.  Walker Kehr, MD

## 2015-10-05 NOTE — Progress Notes (Signed)
Pre visit review using our clinic review tool, if applicable. No additional management support is needed unless otherwise documented below in the visit note. 

## 2015-10-05 NOTE — Assessment & Plan Note (Signed)
On Eliquis

## 2015-10-06 ENCOUNTER — Telehealth: Payer: Self-pay | Admitting: Internal Medicine

## 2015-10-06 NOTE — Telephone Encounter (Signed)
Called spoke with spouse. She reports pt saw PCP yesterday for increase HR, wheezing, cough. He was given depo medrol and a prednisone taper. Pt started the taper this morning. She thinks pt needs to be seen sooner than October by Dr. Annamaria Boots. She is aware Dr. Annamaria Boots has left for the day and is fine with a call back in the morning for an appt. Aware if pt worsens overnight to seek emergency care. Please advise Dr. Annamaria Boots thanks  Allergies  Allergen Reactions  . Ace Inhibitors Other (See Comments)    Severe asthma (COPD)  . Avelox [Moxifloxacin Hcl In Nacl] Other (See Comments)    Gum pain  . Tape Other (See Comments)    Adhesive and tape reaction-tears skin off   . Lidocaine Other (See Comments)    Possible reaction? Dizziness, confusion, syncope  . Morphine And Related Nausea And Vomiting  . Codeine Nausea And Vomiting  . Lasix [Furosemide] Itching and Swelling  . Other Other (See Comments)    Maple trees, allergy symptoms     Current Outpatient Prescriptions on File Prior to Visit  Medication Sig Dispense Refill  . acetaminophen (TYLENOL) 325 MG tablet Take 650 mg by mouth every 6 (six) hours as needed for moderate pain or headache.     . albuterol (PROAIR HFA) 108 (90 Base) MCG/ACT inhaler Inhale 2 puffs into the lungs every 6 (six) hours as needed for wheezing or shortness of breath. 1 Inhaler 12  . apixaban (ELIQUIS) 5 MG TABS tablet Take 1 tablet (5 mg total) by mouth 2 (two) times daily. 60 tablet 11  . bisoprolol (ZEBETA) 10 MG tablet Take 2 tablets (20 mg total) by mouth daily. 60 tablet 0  . calcium-vitamin D (OSCAL WITH D) 500-200 MG-UNIT per tablet Take 1 tablet by mouth 2 (two) times daily.    . cholestyramine (QUESTRAN) 4 g packet MIX AND DRINK 1 PACKET BY MOUTH TWICE A DAY IF NEEDED FOR LOOSE STOOLS 60 packet 2  . clopidogrel (PLAVIX) 75 MG tablet Take 1 tablet (75 mg total) by mouth daily with breakfast. 30 tablet 8  . diltiazem (CARDIZEM CD) 240 MG 24 hr capsule Take 1  capsule (240 mg total) by mouth daily. 30 capsule 0  . ipratropium (ATROVENT) 0.02 % nebulizer solution Take 2.5 mLs (0.5 mg total) by nebulization every 8 (eight) hours. 75 mL 12  . ipratropium-albuterol (DUONEB) 0.5-2.5 (3) MG/3ML SOLN USE ONE VIAL VIA NEBULIZER EVERY 6 HOURS AS NEEDED FOR SHORTNESS OF BREATH 360 mL 5  . isosorbide mononitrate (IMDUR) 60 MG 24 hr tablet Take 1 tablet (60 mg total) by mouth 2 (two) times daily. 180 tablet 0  . latanoprost (XALATAN) 0.005 % ophthalmic solution Place 1 drop into both eyes at bedtime.     Marland Kitchen LORazepam (ATIVAN) 0.5 MG tablet Take 1 tablet (0.5 mg total) by mouth 3 (three) times daily. 90 tablet 2  . mometasone-formoterol (DULERA) 100-5 MCG/ACT AERO inhale 2 puffs by mouth once daily (THEN RINSE MOUTH AFTER USE) 17.6 g 5  . montelukast (SINGULAIR) 10 MG tablet Take 1 tablet (10 mg total) by mouth at bedtime. 90 tablet 3  . nitroGLYCERIN (NITROSTAT) 0.4 MG SL tablet Place 1 tablet (0.4 mg total) under the tongue every 5 (five) minutes as needed for chest pain. 25 tablet 0  . pantoprazole (PROTONIX) 40 MG tablet take 1 tablet by mouth twice a day (Patient taking differently: take 40 mg by mouth twice a day) 60 tablet 11  .  pravastatin (PRAVACHOL) 80 MG tablet Take 1 tablet (80 mg total) by mouth every morning. 30 tablet 2  . predniSONE (DELTASONE) 10 MG tablet Prednisone 10 mg: take 4 tabs a day x 3 days; then 3 tabs a day x 4 days; then 2 tabs a day x 4 days, then 1 tab a day x 6 days, then stop. Take pc. 38 tablet 1  . Probiotic Product (PROBIOTIC DAILY) CAPS Take 1 capsule by mouth at bedtime.    Marland Kitchen RA VITAMIN B-12 TR 1000 MCG TBCR take 1 tablet by mouth once daily (Patient taking differently: take 1000 mcg by mouth once daily) 30 tablet 11  . spironolactone (ALDACTONE) 50 MG tablet Take 1 tablet (50 mg total) by mouth daily. 30 tablet 0  . torsemide (DEMADEX) 20 MG tablet Take 20 mg by mouth daily.    . traZODone (DESYREL) 50 MG tablet Take 0.5-1 tablets  (25-50 mg total) by mouth at bedtime as needed for sleep. 30 tablet 3  . valsartan (DIOVAN) 320 MG tablet Take 1 tablet (320 mg total) by mouth daily. 90 tablet 3   No current facility-administered medications on file prior to visit.

## 2015-10-07 NOTE — Telephone Encounter (Signed)
Recommend work-in with an NP as available

## 2015-10-07 NOTE — Telephone Encounter (Signed)
LMTCB

## 2015-10-07 NOTE — Telephone Encounter (Signed)
Called spoke with pt's wife. I scheduled her for an ov with TP on 10/26/15. She voiced understanding and had no further questions.

## 2015-10-11 ENCOUNTER — Telehealth: Payer: Self-pay | Admitting: Emergency Medicine

## 2015-10-11 NOTE — Telephone Encounter (Signed)
Patients wife called and needs a refill on torsemide (DEMADEX) 20 MG tablet. Patient only has 2 days worth left. Pharmacy is [facility]

## 2015-10-12 ENCOUNTER — Other Ambulatory Visit: Payer: Self-pay | Admitting: Internal Medicine

## 2015-10-13 NOTE — Telephone Encounter (Signed)
RX sent to [facility] 

## 2015-10-14 ENCOUNTER — Encounter: Payer: Self-pay | Admitting: Physician Assistant

## 2015-10-14 ENCOUNTER — Ambulatory Visit (INDEPENDENT_AMBULATORY_CARE_PROVIDER_SITE_OTHER): Payer: Medicare Other | Admitting: Physician Assistant

## 2015-10-14 VITALS — BP 122/58 | HR 64 | Ht 62.0 in | Wt 144.2 lb

## 2015-10-14 DIAGNOSIS — I5032 Chronic diastolic (congestive) heart failure: Secondary | ICD-10-CM | POA: Diagnosis not present

## 2015-10-14 DIAGNOSIS — I2510 Atherosclerotic heart disease of native coronary artery without angina pectoris: Secondary | ICD-10-CM

## 2015-10-14 DIAGNOSIS — Z9861 Coronary angioplasty status: Secondary | ICD-10-CM | POA: Diagnosis not present

## 2015-10-14 DIAGNOSIS — I480 Paroxysmal atrial fibrillation: Secondary | ICD-10-CM

## 2015-10-14 DIAGNOSIS — I10 Essential (primary) hypertension: Secondary | ICD-10-CM | POA: Diagnosis not present

## 2015-10-14 DIAGNOSIS — R197 Diarrhea, unspecified: Secondary | ICD-10-CM

## 2015-10-14 MED ORDER — METRONIDAZOLE 500 MG PO TABS: 500.0000 mg | ORAL_TABLET | Freq: Three times a day (TID) | ORAL | Status: DC

## 2015-10-14 NOTE — Patient Instructions (Addendum)
Your physician has recommended you make the following change in your medication: START Flagyl - take as directed -- make sure you take your probiotic while on this medication  Call your primary care MD on Monday July 10 if your symptoms have not improved  Your physician recommends that you weigh daily, at the same time every day, and in the same amount of clothing. Please record your daily weights on the handout provided and bring it to your next appointment.  Please keep your scheduled follow up with Dr. Percival Spanish

## 2015-10-14 NOTE — Progress Notes (Signed)
Cardiology Office Note   Date:  10/14/2015   ID:  [name]., DOB 1930-12-16, MRN [id number]  PCP:  Binnie Rail, MD  Cardiologist:  Dr Mardene Celeste, PA-C   Chief Complaint  Patient presents with  . Fatigue  . C-diff    ??  . Diarrhea    14 days un controllable  . Depression    History of Present Illness: Shawnell Gadaleta. is a 82 y.o. male with a history of CABG 2008, DES SVG-PDA 2012, NSTEMI 2016 w/ SVG-PL 99% (s/p stent RCA), PAD w/ PCI subclavian 2016, atrial fib on Eliquis, OSA, GERD, HTN, HLD, COPD, hyponatremia, D-CHF.  D/c 09/26/2015 after admit for COPD exacerbation, had mild AS w/ EF 60-65% by echo 09/24/2015.  [name]. presents for Post hospital follow-up.  Since discharge from the hospital, his respiratory status has been good. He tracks his weight and has scales from his insurance company which are monitored by them. This is assisted him in maintaining his weight. He feels his volume status is good. Shortness of breath is at baseline. He's had no palpitations and no chest pain. He wears compression socks consistently and they really help the lower extremity edema, even though they're difficult to get on and off.  Since discharge from the hospital he has had diarrhea. It is watery and he is having multiple stools a day. He is taking cholestyramine and probiotics without any improvement. Getting worse every day. He feels weak from this. He has not seen blood in his stools. He's not had fevers or chills. He is increasing his water intake a little bit because he knows he is losing fluid through the diarrhea. He is not getting lightheaded or dizzy when he changes position.   Past Medical History  Diagnosis Date  . CORONARY HEART DISEASE     a. s/p CABG;  b. cath 06/27/10: S-Dx occluded, S-PDA 80-90% (tx with PCI); S-OM ok, L-LAD ok;  EF 65-70%  c.  s/p Promus DES to S-PDA 06/2010;   d. Cath 8 2016 LIMA to the LAD patent, SVG to posterior  lateral subtotal, SVG to diagonal occluded chronically. The patient had stenting of the native right vessel.  Marland Kitchen FIBRILLATION, ATRIAL   . SLEEP APNEA 09/2001    NPSG AHI 22/HR  . ASTHMA   . Depression   . GERD     with HH, hx esophageal stricture  . DYSLIPIDEMIA   . HYPERTENSION     Echo 3/12: EF 55-60%; mod LVH; mild AS/AI; LAE; PASP 38; mild pulmo HTN  . Personal history of alcoholism ([place])   . Diverticulosis   . Benign liver cyst   . Skin cancer     L forearm  . Cataract     surgery to both eyes  . Esophageal stricture   . Hiatal hernia   . Pneumonia 04/2013 hosp  . Anxiety   . Arthritis   . CHF (congestive heart failure) ([place])   . COPD (chronic obstructive pulmonary disease) ([place])   . Glaucoma   . Osteoporosis   . Subclavian arterial stenosis [facility])     Past Surgical History  Procedure Laterality Date  . Coronary artery bypass graft  02/2007  . Tonsillectomy    . Coronary angioplasty with stent placement  07/2010  . Cataract extraction, bilateral  2007  . Hernia repair  unsure ?60's  . Cholecystectomy  02/21/2011    Procedure: LAPAROSCOPIC CHOLECYSTECTOMY WITH INTRAOPERATIVE  CHOLANGIOGRAM;  Surgeon: Pedro Earls, MD;  Location: [facility];  Service: General;  Laterality: N/A;  . Cardiac catheterization N/A 11/26/2014    Procedure: Left Heart Cath and Cors/Grafts Angiography;  Surgeon: Sherren Mocha, MD;  Location: [place] [facility];  Service: Cardiovascular;  Laterality: N/A;  . Cardiac catheterization N/A 11/26/2014    Procedure: Coronary Stent Intervention;  Surgeon: Sherren Mocha, MD;  Location: [place] [facility];  Service: Cardiovascular;  Laterality: N/A;  . Peripheral vascular catheterization N/A 02/22/2015    Procedure: Upper Extremity Angiography;  Surgeon: Lorretta Harp, MD;  Location: [place] [facility];  Service: Cardiovascular;  Laterality: N/A;    Current Outpatient Prescriptions  Medication Sig Dispense Refill  . acetaminophen (TYLENOL) 325 MG tablet  Take 650 mg by mouth every 6 (six) hours as needed for moderate pain or headache.     . albuterol (PROAIR HFA) 108 (90 Base) MCG/ACT inhaler Inhale 2 puffs into the lungs every 6 (six) hours as needed for wheezing or shortness of breath. 1 Inhaler 12  . apixaban (ELIQUIS) 5 MG TABS tablet Take 1 tablet (5 mg total) by mouth 2 (two) times daily. 60 tablet 11  . bisoprolol (ZEBETA) 10 MG tablet Take 2 tablets (20 mg total) by mouth daily. 60 tablet 0  . calcium-vitamin D (OSCAL WITH D) 500-200 MG-UNIT per tablet Take 1 tablet by mouth 2 (two) times daily.    . cholestyramine (QUESTRAN) 4 g packet MIX AND DRINK 1 PACKET BY MOUTH TWICE A DAY IF NEEDED FOR LOOSE STOOLS 60 packet 2  . clopidogrel (PLAVIX) 75 MG tablet Take 1 tablet (75 mg total) by mouth daily with breakfast. 30 tablet 8  . diltiazem (CARDIZEM CD) 240 MG 24 hr capsule Take 1 capsule (240 mg total) by mouth daily. 30 capsule 0  . ipratropium (ATROVENT) 0.02 % nebulizer solution Take 2.5 mLs (0.5 mg total) by nebulization every 8 (eight) hours. 75 mL 12  . ipratropium-albuterol (DUONEB) 0.5-2.5 (3) MG/3ML SOLN USE ONE VIAL VIA NEBULIZER EVERY 6 HOURS AS NEEDED FOR SHORTNESS OF BREATH 360 mL 5  . isosorbide mononitrate (IMDUR) 60 MG 24 hr tablet Take 1 tablet (60 mg total) by mouth 2 (two) times daily. 180 tablet 0  . latanoprost (XALATAN) 0.005 % ophthalmic solution Place 1 drop into both eyes at bedtime.     Marland Kitchen LORazepam (ATIVAN) 0.5 MG tablet Take 1 tablet (0.5 mg total) by mouth 3 (three) times daily. 90 tablet 2  . mometasone-formoterol (DULERA) 100-5 MCG/ACT AERO inhale 2 puffs by mouth once daily (THEN RINSE MOUTH AFTER USE) 17.6 g 5  . montelukast (SINGULAIR) 10 MG tablet Take 1 tablet (10 mg total) by mouth at bedtime. 90 tablet 3  . nitroGLYCERIN (NITROSTAT) 0.4 MG SL tablet Place 1 tablet (0.4 mg total) under the tongue every 5 (five) minutes as needed for chest pain. 25 tablet 0  . pantoprazole (PROTONIX) 40 MG tablet take 1 tablet  by mouth twice a day (Patient taking differently: take 40 mg by mouth twice a day) 60 tablet 11  . pravastatin (PRAVACHOL) 80 MG tablet Take 1 tablet (80 mg total) by mouth every morning. 30 tablet 2  . predniSONE (DELTASONE) 10 MG tablet Prednisone 10 mg: take 4 tabs a day x 3 days; then 3 tabs a day x 4 days; then 2 tabs a day x 4 days, then 1 tab a day x 6 days, then stop. Take pc. 38 tablet 1  . Probiotic  Product (PROBIOTIC DAILY) CAPS Take 1 capsule by mouth at bedtime.    Marland Kitchen RA VITAMIN B-12 TR 1000 MCG TBCR take 1 tablet by mouth once daily (Patient taking differently: take 1000 mcg by mouth once daily) 30 tablet 11  . spironolactone (ALDACTONE) 50 MG tablet Take 1 tablet (50 mg total) by mouth daily. 30 tablet 0  . torsemide (DEMADEX) 20 MG tablet Take 20 mg by mouth daily.    Marland Kitchen torsemide (DEMADEX) 20 MG tablet take 1 tablet by mouth three times a day 90 tablet 5  . traZODone (DESYREL) 50 MG tablet Take 0.5-1 tablets (25-50 mg total) by mouth at bedtime as needed for sleep. 30 tablet 3  . valsartan (DIOVAN) 320 MG tablet Take 1 tablet (320 mg total) by mouth daily. 90 tablet 3   No current facility-administered medications for this visit.    Allergies:   Ace inhibitors; Avelox; Tape; Lidocaine; Morphine and related; Codeine; Lasix; and Other    Social History:  The patient  reports that he quit smoking about 36 years ago. His smoking use included Cigarettes. He has a 120 pack-year smoking history. He has never used smokeless tobacco. He reports that he does not drink alcohol or use illicit drugs.   Family History:  The patient's family history includes Asthma in his sister; COPD in his sister; Emphysema in his sister; Heart disease in his brother, father, and mother; Hyperlipidemia in his father, mother, and sister; Hypertension in his father, mother, and sister. There is no history of Colon cancer.    ROS:  Please see the history of present illness. All other systems are reviewed and  negative.    PHYSICAL EXAM: VS:  BP 122/58 mmHg  Pulse 64  Ht 5\' 2"  (1.575 m)  Wt 144 lb 3.2 oz (65.409 kg)  BMI 26.37 kg/m2 , BMI Body mass index is 26.37 kg/(m^2). GEN: Well nourished, well developed, male in no acute distress HEENT: normal for age  Neck: mild JVD L>R, no carotid bruit, no masses Cardiac: RRR; 2/6 murmur, no rubs, or gallops Respiratory:  Scattered rales bilaterally, normal work of breathing GI: soft, nontender, nondistended, + BS MS: no deformity or atrophy; no edema; distal pulses are 2+ in all 4 extremities  Skin: warm and dry, no rash Neuro:  Strength and sensation are intact Psych: euthymic mood, full affect   EKG:  EKG is ordered today. The ekg ordered today demonstrates sinus rhythm, no acute ischemic changes   Recent Labs: 09/23/2015: ALT 19; Magnesium 2.0; TSH 1.179 09/24/2015: Hemoglobin 13.3; Platelets 190 09/25/2015: B Natriuretic Peptide 345.5* 09/26/2015: BUN 28*; Creatinine, Ser 1.13; Potassium 3.1*; Sodium 135    Lipid Panel    Component Value Date/Time   CHOL 128 06/02/2015 1713   TRIG 95.0 06/02/2015 1713   TRIG 53 07/12/2009   HDL 37.00* 06/02/2015 1713   CHOLHDL 3 06/02/2015 1713   VLDL 19.0 06/02/2015 1713   LDLCALC 72 06/02/2015 1713     Wt Readings from Last 3 Encounters:  10/14/15 144 lb 3.2 oz (65.409 kg)  10/05/15 148 lb (67.132 kg)  09/26/15 143 lb 4.8 oz (65 kg)     Other studies Reviewed: Additional studies/ records that were reviewed today include: Hospital records, office notes, labs and testing.  ASSESSMENT AND PLAN:  1.  CAD: He is having no ongoing ischemic symptoms. He is not on aspirin because he is on Eliquis. He is on Plavix, bisoprolol, Imdur, Diovan, Pravachol, and sublingual nitroglycerin when necessary. Continue current  therapy with no med changes.  2. PAF: His CHADS2VASC=5 (CAD, CHF, HTN, age x 2). He is having no bleeding issues with Eliquis. It is expensive but preferable for him when compared to  Coumadin.  3. Hypertension: According to he and his wife, his systolic blood pressure can be greater than 164 can be 110. It is currently well controlled. Orthostatic vital signs were checked systolic dropped from [id number] down to 119 when he went from sitting to standing. His heart rate went from 65-69. His heart rate and blood pressure came back to previous values within 3 minutes. No med changes at this time.  4. Chronic diastolic CHF: He is taking his torsemide every day but he is managing with fluid to replace his losses from the diarrhea and his weight is remaining stable. No changes  5. Diarrhea: During his recent hospitalization, he was on antibiotics and was given steroids. He would be at high risk to have C. difficile. I will start empiric treatment with metronidazole. He is to follow-up with his primary care physician if symptoms do not improve by Monday.   Current medicines are reviewed at length with the patient today.  The patient does not have concerns regarding medicines.  The following changes have been made:  Add metronidazole  Labs/ tests ordered today include:   Orders Placed This Encounter  Procedures  . EKG 12-Lead     Disposition:   FU with Dr. [place]  Signed, Rosaria Ferries, PA-C  10/14/2015 4:38 PM    [facility] Phone: [phone]; Fax: [phone]  This note was written with the assistance of speech recognition software. Please excuse any transcriptional errors.

## 2015-10-17 ENCOUNTER — Emergency Department (HOSPITAL_COMMUNITY): Payer: Medicare Other

## 2015-10-17 ENCOUNTER — Encounter (HOSPITAL_COMMUNITY): Payer: Self-pay

## 2015-10-17 ENCOUNTER — Inpatient Hospital Stay (HOSPITAL_COMMUNITY)
Admission: EM | Admit: 2015-10-17 | Discharge: 2015-10-21 | DRG: 309 | Disposition: A | Discharge status: Home / Self Care | Payer: Medicare Other | Attending: Cardiology | Admitting: Cardiology

## 2015-10-17 DIAGNOSIS — I252 Old myocardial infarction: Secondary | ICD-10-CM | POA: Diagnosis not present

## 2015-10-17 DIAGNOSIS — E878 Other disorders of electrolyte and fluid balance, not elsewhere classified: Secondary | ICD-10-CM | POA: Diagnosis present

## 2015-10-17 DIAGNOSIS — N179 Acute kidney failure, unspecified: Secondary | ICD-10-CM | POA: Diagnosis present

## 2015-10-17 DIAGNOSIS — H409 Unspecified glaucoma: Secondary | ICD-10-CM | POA: Diagnosis not present

## 2015-10-17 DIAGNOSIS — E861 Hypovolemia: Secondary | ICD-10-CM | POA: Diagnosis not present

## 2015-10-17 DIAGNOSIS — I2510 Atherosclerotic heart disease of native coronary artery without angina pectoris: Secondary | ICD-10-CM | POA: Diagnosis present

## 2015-10-17 DIAGNOSIS — G4733 Obstructive sleep apnea (adult) (pediatric): Secondary | ICD-10-CM | POA: Diagnosis present

## 2015-10-17 DIAGNOSIS — Z955 Presence of coronary angioplasty implant and graft: Secondary | ICD-10-CM | POA: Diagnosis not present

## 2015-10-17 DIAGNOSIS — K219 Gastro-esophageal reflux disease without esophagitis: Secondary | ICD-10-CM | POA: Diagnosis not present

## 2015-10-17 DIAGNOSIS — I5032 Chronic diastolic (congestive) heart failure: Secondary | ICD-10-CM | POA: Diagnosis present

## 2015-10-17 DIAGNOSIS — I130 Hypertensive heart and chronic kidney disease with heart failure and stage 1 through stage 4 chronic kidney disease, or unspecified chronic kidney disease: Secondary | ICD-10-CM | POA: Diagnosis not present

## 2015-10-17 DIAGNOSIS — E871 Hypo-osmolality and hyponatremia: Secondary | ICD-10-CM | POA: Diagnosis present

## 2015-10-17 DIAGNOSIS — I499 Cardiac arrhythmia, unspecified: Secondary | ICD-10-CM | POA: Diagnosis not present

## 2015-10-17 DIAGNOSIS — R001 Bradycardia, unspecified: Principal | ICD-10-CM | POA: Diagnosis present

## 2015-10-17 DIAGNOSIS — Z87891 Personal history of nicotine dependence: Secondary | ICD-10-CM | POA: Diagnosis not present

## 2015-10-17 DIAGNOSIS — Z951 Presence of aortocoronary bypass graft: Secondary | ICD-10-CM | POA: Diagnosis not present

## 2015-10-17 DIAGNOSIS — Z85828 Personal history of other malignant neoplasm of skin: Secondary | ICD-10-CM

## 2015-10-17 DIAGNOSIS — D649 Anemia, unspecified: Secondary | ICD-10-CM | POA: Diagnosis not present

## 2015-10-17 DIAGNOSIS — I25118 Atherosclerotic heart disease of native coronary artery with other forms of angina pectoris: Secondary | ICD-10-CM | POA: Diagnosis not present

## 2015-10-17 DIAGNOSIS — Z7901 Long term (current) use of anticoagulants: Secondary | ICD-10-CM

## 2015-10-17 DIAGNOSIS — I480 Paroxysmal atrial fibrillation: Secondary | ICD-10-CM | POA: Diagnosis present

## 2015-10-17 DIAGNOSIS — R197 Diarrhea, unspecified: Secondary | ICD-10-CM | POA: Diagnosis not present

## 2015-10-17 DIAGNOSIS — E875 Hyperkalemia: Secondary | ICD-10-CM | POA: Diagnosis present

## 2015-10-17 DIAGNOSIS — J441 Chronic obstructive pulmonary disease with (acute) exacerbation: Secondary | ICD-10-CM | POA: Diagnosis not present

## 2015-10-17 DIAGNOSIS — D72829 Elevated white blood cell count, unspecified: Secondary | ICD-10-CM | POA: Diagnosis not present

## 2015-10-17 DIAGNOSIS — J449 Chronic obstructive pulmonary disease, unspecified: Secondary | ICD-10-CM | POA: Diagnosis not present

## 2015-10-17 DIAGNOSIS — Z7902 Long term (current) use of antithrombotics/antiplatelets: Secondary | ICD-10-CM

## 2015-10-17 DIAGNOSIS — I10 Essential (primary) hypertension: Secondary | ICD-10-CM | POA: Diagnosis present

## 2015-10-17 DIAGNOSIS — N289 Disorder of kidney and ureter, unspecified: Secondary | ICD-10-CM | POA: Diagnosis not present

## 2015-10-17 DIAGNOSIS — E785 Hyperlipidemia, unspecified: Secondary | ICD-10-CM | POA: Diagnosis present

## 2015-10-17 DIAGNOSIS — Z79899 Other long term (current) drug therapy: Secondary | ICD-10-CM

## 2015-10-17 DIAGNOSIS — I4891 Unspecified atrial fibrillation: Secondary | ICD-10-CM | POA: Diagnosis not present

## 2015-10-17 DIAGNOSIS — I440 Atrioventricular block, first degree: Secondary | ICD-10-CM | POA: Diagnosis not present

## 2015-10-17 DIAGNOSIS — N182 Chronic kidney disease, stage 2 (mild): Secondary | ICD-10-CM | POA: Diagnosis present

## 2015-10-17 DIAGNOSIS — M810 Age-related osteoporosis without current pathological fracture: Secondary | ICD-10-CM | POA: Diagnosis present

## 2015-10-17 DIAGNOSIS — Z8249 Family history of ischemic heart disease and other diseases of the circulatory system: Secondary | ICD-10-CM

## 2015-10-17 DIAGNOSIS — R531 Weakness: Secondary | ICD-10-CM | POA: Diagnosis not present

## 2015-10-17 DIAGNOSIS — Z825 Family history of asthma and other chronic lower respiratory diseases: Secondary | ICD-10-CM

## 2015-10-17 HISTORY — DX: Chronic kidney disease, stage 2 (mild): N18.2

## 2015-10-17 HISTORY — DX: Essential (primary) hypertension: I10

## 2015-10-17 HISTORY — DX: Paroxysmal atrial fibrillation: I48.0

## 2015-10-17 HISTORY — DX: Atherosclerotic heart disease of native coronary artery without angina pectoris: I25.10

## 2015-10-17 LAB — CBC WITH DIFFERENTIAL/PLATELET
BASOS PCT: 0 %
Basophils Absolute: 0 10*3/uL (ref 0.0–0.1)
EOS PCT: 0 %
Eosinophils Absolute: 0 10*3/uL (ref 0.0–0.7)
HEMATOCRIT: 33.7 % — AB (ref 39.0–52.0)
Hemoglobin: 11.6 g/dL — ABNORMAL LOW (ref 13.0–17.0)
LYMPHS PCT: 17 %
Lymphs Abs: 3.3 10*3/uL (ref 0.7–4.0)
MCH: 29.7 pg (ref 26.0–34.0)
MCHC: 34.4 g/dL (ref 30.0–36.0)
MCV: 86.4 fL (ref 78.0–100.0)
MONO ABS: 1.6 10*3/uL — AB (ref 0.1–1.0)
MONOS PCT: 8 %
NEUTROS ABS: 14.8 10*3/uL — AB (ref 1.7–7.7)
Neutrophils Relative %: 75 %
Platelets: 226 10*3/uL (ref 150–400)
RBC: 3.9 MIL/uL — ABNORMAL LOW (ref 4.22–5.81)
RDW: 15.2 % (ref 11.5–15.5)
WBC: 19.6 10*3/uL — ABNORMAL HIGH (ref 4.0–10.5)

## 2015-10-17 LAB — COMPREHENSIVE METABOLIC PANEL
ALT: 15 U/L — AB (ref 17–63)
AST: 16 U/L (ref 15–41)
Albumin: 3 g/dL — ABNORMAL LOW (ref 3.5–5.0)
Alkaline Phosphatase: 35 U/L — ABNORMAL LOW (ref 38–126)
Anion gap: 7 (ref 5–15)
BILIRUBIN TOTAL: 1 mg/dL (ref 0.3–1.2)
BUN: 68 mg/dL — ABNORMAL HIGH (ref 6–20)
CALCIUM: 9.1 mg/dL (ref 8.9–10.3)
CO2: 26 mmol/L (ref 22–32)
Chloride: 86 mmol/L — ABNORMAL LOW (ref 101–111)
Creatinine, Ser: 2 mg/dL — ABNORMAL HIGH (ref 0.61–1.24)
GFR calc Af Amer: 34 mL/min — ABNORMAL LOW (ref >60)
GFR, EST NON AFRICAN AMERICAN: 29 mL/min — AB (ref >60)
GLUCOSE: 115 mg/dL — AB (ref 65–99)
Potassium: 5.8 mmol/L — ABNORMAL HIGH (ref 3.5–5.1)
Sodium: 119 mmol/L — CL (ref 135–145)
TOTAL PROTEIN: 5.2 g/dL — AB (ref 6.5–8.1)

## 2015-10-17 LAB — I-STAT TROPONIN, ED: Troponin i, poc: 0.01 ng/mL (ref 0.00–0.08)

## 2015-10-17 LAB — LIPASE, BLOOD: LIPASE: 44 U/L (ref 11–51)

## 2015-10-17 LAB — BASIC METABOLIC PANEL
ANION GAP: 9 (ref 5–15)
BUN: 71 mg/dL — ABNORMAL HIGH (ref 6–20)
CALCIUM: 8.8 mg/dL — AB (ref 8.9–10.3)
CHLORIDE: 91 mmol/L — AB (ref 101–111)
CO2: 19 mmol/L — AB (ref 22–32)
CREATININE: 1.87 mg/dL — AB (ref 0.61–1.24)
GFR calc Af Amer: 37 mL/min — ABNORMAL LOW (ref >60)
GFR calc non Af Amer: 32 mL/min — ABNORMAL LOW (ref >60)
GLUCOSE: 140 mg/dL — AB (ref 65–99)
Potassium: 5.4 mmol/L — ABNORMAL HIGH (ref 3.5–5.1)
Sodium: 119 mmol/L — CL (ref 135–145)

## 2015-10-17 LAB — OSMOLALITY: OSMOLALITY: 274 mosm/kg — AB (ref 275–295)

## 2015-10-17 LAB — MAGNESIUM: Magnesium: 2.2 mg/dL (ref 1.7–2.4)

## 2015-10-17 LAB — MRSA PCR SCREENING: MRSA by PCR: NEGATIVE

## 2015-10-17 LAB — BRAIN NATRIURETIC PEPTIDE: B Natriuretic Peptide: 150.2 pg/mL — ABNORMAL HIGH (ref 0.0–100.0)

## 2015-10-17 MED ORDER — GLUCAGON HCL RDNA (DIAGNOSTIC) 1 MG IJ SOLR
5.0000 mg | Freq: Once | INTRAMUSCULAR | Status: AC
  Administered 2015-10-17: 5 mg via INTRAVENOUS

## 2015-10-17 MED ORDER — PANTOPRAZOLE SODIUM 40 MG PO TBEC: 40.0000 mg | DELAYED_RELEASE_TABLET | Freq: Every day | ORAL | Status: DC

## 2015-10-17 MED ORDER — PREDNISONE 10 MG PO TABS
10.0000 mg | ORAL_TABLET | Freq: Every day | ORAL | Status: DC
  Administered 2015-10-18: 10 mg via ORAL
  Filled 2015-10-17: qty 1

## 2015-10-17 MED ORDER — APIXABAN 2.5 MG PO TABS
2.5000 mg | ORAL_TABLET | Freq: Two times a day (BID) | ORAL | Status: DC
  Administered 2015-10-18 – 2015-10-19 (×4): 2.5 mg via ORAL
  Filled 2015-10-17 (×4): qty 1

## 2015-10-17 MED ORDER — SODIUM CHLORIDE 0.9 % IV BOLUS (SEPSIS)
500.0000 mL | Freq: Once | INTRAVENOUS | Status: AC
  Administered 2015-10-17: 500 mL via INTRAVENOUS

## 2015-10-17 MED ORDER — ACETAMINOPHEN 325 MG PO TABS
650.0000 mg | ORAL_TABLET | ORAL | Status: DC | PRN
  Filled 2015-10-17: qty 2

## 2015-10-17 MED ORDER — METRONIDAZOLE 500 MG PO TABS
500.0000 mg | ORAL_TABLET | Freq: Three times a day (TID) | ORAL | Status: DC
  Administered 2015-10-18: 500 mg via ORAL
  Filled 2015-10-17: qty 1

## 2015-10-17 MED ORDER — ONDANSETRON HCL 4 MG/2ML IJ SOLN
4.0000 mg | Freq: Four times a day (QID) | INTRAMUSCULAR | Status: DC | PRN
  Administered 2015-10-17 – 2015-10-18 (×2): 4 mg via INTRAVENOUS
  Filled 2015-10-17 (×2): qty 2

## 2015-10-17 MED ORDER — DOPAMINE-DEXTROSE 3.2-5 MG/ML-% IV SOLN
INTRAVENOUS | Status: AC
  Filled 2015-10-17: qty 250

## 2015-10-17 MED ORDER — ONDANSETRON HCL 4 MG/2ML IJ SOLN
4.0000 mg | Freq: Once | INTRAMUSCULAR | Status: AC
  Administered 2015-10-17: 4 mg via INTRAVENOUS
  Filled 2015-10-17: qty 2

## 2015-10-17 MED ORDER — LATANOPROST 0.005 % OP SOLN
1.0000 [drp] | Freq: Every day | OPHTHALMIC | Status: DC
Start: 2015-10-17 — End: 2015-10-21
  Administered 2015-10-18: 1 [drp] via OPHTHALMIC
  Filled 2015-10-17 (×2): qty 2.5

## 2015-10-17 MED ORDER — ATROPINE SULFATE 0.4 MG/ML IJ SOLN: 0.4000 mg | Freq: Once | INTRAMUSCULAR | Status: DC

## 2015-10-17 MED ORDER — SODIUM CHLORIDE 0.9 % IV SOLN
INTRAVENOUS | Status: AC
  Administered 2015-10-17: 21:00:00 via INTRAVENOUS

## 2015-10-17 MED ORDER — ATROPINE SULFATE 1 MG/10ML IJ SOSY
PREFILLED_SYRINGE | INTRAMUSCULAR | Status: AC
  Administered 2015-10-17: 0.5 mg
  Filled 2015-10-17: qty 10

## 2015-10-17 MED ORDER — PROMETHAZINE HCL 25 MG/ML IJ SOLN
6.2500 mg | Freq: Once | INTRAMUSCULAR | Status: AC
  Administered 2015-10-17: 6.25 mg via INTRAVENOUS
  Filled 2015-10-17: qty 1

## 2015-10-17 MED ORDER — SODIUM CHLORIDE 0.9 % IV SOLN
1.0000 g | Freq: Once | INTRAVENOUS | Status: AC
  Administered 2015-10-17: 1 g via INTRAVENOUS
  Filled 2015-10-17: qty 10

## 2015-10-17 MED ORDER — DOPAMINE-DEXTROSE 3.2-5 MG/ML-% IV SOLN
2.0000 ug/kg/min | INTRAVENOUS | Status: DC
  Administered 2015-10-17: 5 ug/kg/min via INTRAVENOUS

## 2015-10-17 MED ORDER — ALBUTEROL SULFATE (2.5 MG/3ML) 0.083% IN NEBU
2.5000 mg | INHALATION_SOLUTION | Freq: Four times a day (QID) | RESPIRATORY_TRACT | Status: DC | PRN
  Administered 2015-10-18: 2.5 mg via RESPIRATORY_TRACT
  Filled 2015-10-17: qty 3

## 2015-10-17 MED ORDER — TRAZODONE HCL 50 MG PO TABS
50.0000 mg | ORAL_TABLET | Freq: Every evening | ORAL | Status: DC | PRN
  Administered 2015-10-19 – 2015-10-20 (×2): 50 mg via ORAL
  Filled 2015-10-17 (×2): qty 1

## 2015-10-17 MED ORDER — NITROGLYCERIN 0.4 MG SL SUBL: 0.4000 mg | SUBLINGUAL_TABLET | SUBLINGUAL | Status: DC | PRN

## 2015-10-17 MED ORDER — ACETAMINOPHEN 325 MG PO TABS: 650.0000 mg | ORAL_TABLET | Freq: Four times a day (QID) | ORAL | Status: DC | PRN

## 2015-10-17 MED ORDER — PRAVASTATIN SODIUM 40 MG PO TABS
80.0000 mg | ORAL_TABLET | Freq: Every day | ORAL | Status: DC
  Administered 2015-10-18 – 2015-10-20 (×3): 80 mg via ORAL
  Filled 2015-10-17 (×3): qty 2

## 2015-10-17 MED ORDER — CLOPIDOGREL BISULFATE 75 MG PO TABS
75.0000 mg | ORAL_TABLET | Freq: Every day | ORAL | Status: DC
  Administered 2015-10-18 – 2015-10-21 (×4): 75 mg via ORAL
  Filled 2015-10-17 (×4): qty 1

## 2015-10-17 NOTE — ED Provider Notes (Signed)
CSN: [id number]     Arrival date & time 10/17/15  1542 History   First MD Initiated Contact with Patient 10/17/15 1543     Chief Complaint  Patient presents with  . Bradycardia   (Consider location/radiation/quality/duration/timing/severity/associated sxs/prior Treatment) Patient is a 82 y.o. male presenting with weakness. The history is provided by the patient.  Weakness This is a new problem. The current episode started today. The problem occurs constantly. The problem has been gradually improving. Associated symptoms include abdominal pain and weakness. Pertinent negatives include no chest pain, chills, congestion, coughing, fever, nausea, neck pain, numbness, rash, sore throat or vomiting. Nothing aggravates the symptoms. Treatments tried: Glucagon, atropine.    Past Medical History  Diagnosis Date  . CORONARY HEART DISEASE     a. s/p CABG;  b. cath 06/27/10: S-Dx occluded, S-PDA 80-90% (tx with PCI); S-OM ok, L-LAD ok;  EF 65-70%  c.  s/p Promus DES to S-PDA 06/2010;   d. Cath 8 2016 LIMA to the LAD patent, SVG to posterior lateral subtotal, SVG to diagonal occluded chronically. The patient had stenting of the native right vessel.  Marland Kitchen FIBRILLATION, ATRIAL   . SLEEP APNEA 09/2001    NPSG AHI 22/HR  . ASTHMA   . Depression   . GERD     with HH, hx esophageal stricture  . DYSLIPIDEMIA   . HYPERTENSION     Echo 3/12: EF 55-60%; mod LVH; mild AS/AI; LAE; PASP 38; mild pulmo HTN  . Personal history of alcoholism ([place])   . Diverticulosis   . Benign liver cyst   . Skin cancer     L forearm  . Cataract     surgery to both eyes  . Esophageal stricture   . Hiatal hernia   . Pneumonia 04/2013 hosp  . Anxiety   . Arthritis   . Chronic diastolic CHF (congestive heart failure), NYHA class 2 ([place])   . COPD (chronic obstructive pulmonary disease) ([place])   . Glaucoma   . Osteoporosis   . Subclavian arterial stenosis [facility])    Past Surgical History  Procedure Laterality Date  . Coronary  artery bypass graft  02/2007  . Tonsillectomy    . Coronary angioplasty with stent placement  07/2010  . Cataract extraction, bilateral  2007  . Hernia repair  unsure ?60's  . Cholecystectomy  02/21/2011    Procedure: LAPAROSCOPIC CHOLECYSTECTOMY WITH INTRAOPERATIVE CHOLANGIOGRAM;  Surgeon: Pedro Earls, MD;  Location: [facility];  Service: General;  Laterality: N/A;  . Cardiac catheterization N/A 11/26/2014    Procedure: Left Heart Cath and Cors/Grafts Angiography;  Surgeon: Sherren Mocha, MD;  Location: [facility];  Service: Cardiovascular;  Laterality: N/A;  . Cardiac catheterization N/A 11/26/2014    Procedure: Coronary Stent Intervention;  Surgeon: Sherren Mocha, MD;  Location: [facility];  Service: Cardiovascular;  Laterality: N/A;  . Peripheral vascular catheterization N/A 02/22/2015    Procedure: Upper Extremity Angiography;  Surgeon: Lorretta Harp, MD;  Location: [facility];  Service: Cardiovascular;  Laterality: N/A;   Family History  Problem Relation Age of Onset  . COPD Sister   . Asthma Sister   . Emphysema Sister   . Hypertension Sister   . Hyperlipidemia Sister   . Hypertension Mother   . Heart disease Mother   . Hyperlipidemia Mother   . Colon cancer Neg Hx   . Heart disease Brother   . Heart disease Father   . Hyperlipidemia Father   .  Hypertension Father    Social History  Substance Use Topics  . Smoking status: Former Smoker -- 3.00 packs/day for 40 years    Types: Cigarettes    Quit date: 04/11/1979  . Smokeless tobacco: Never Used  . Alcohol Use: No     Comment: quit drinking 40+ years    Review of Systems  Constitutional: Negative for fever and chills.  HENT: Negative for congestion and sore throat.   Eyes: Negative for pain.  Respiratory: Negative for cough and shortness of breath.   Cardiovascular: Negative for chest pain, palpitations and leg swelling.  Gastrointestinal: Positive for abdominal pain. Negative for nausea,  vomiting and diarrhea.  Endocrine: Negative.   Genitourinary: Negative for flank pain.  Musculoskeletal: Negative for back pain and neck pain.  Skin: Negative for rash.  Allergic/Immunologic: Negative.   Neurological: Positive for weakness. Negative for dizziness, syncope, light-headedness and numbness.  Psychiatric/Behavioral: Negative for confusion.    Allergies  Ace inhibitors; Avelox; Tape; Lidocaine; Morphine and related; Codeine; Lasix; and Other  Home Medications   Prior to Admission medications   Medication Sig Start Date End Date Taking? Authorizing Provider  acetaminophen (TYLENOL) 325 MG tablet Take 650 mg by mouth every 6 (six) hours as needed for moderate pain or headache.    Yes Historical Provider, MD  albuterol (PROAIR HFA) 108 (90 Base) MCG/ACT inhaler Inhale 2 puffs into the lungs every 6 (six) hours as needed for wheezing or shortness of breath. 08/16/15  Yes Deneise Lever, MD  apixaban (ELIQUIS) 5 MG TABS tablet Take 1 tablet (5 mg total) by mouth 2 (two) times daily. 05/20/15  Yes Minus Breeding, MD  bisoprolol (ZEBETA) 10 MG tablet Take 2 tablets (20 mg total) by mouth daily. 09/26/15  Yes Janece Canterbury, MD  calcium-vitamin D (OSCAL WITH D) 500-200 MG-UNIT per tablet Take 1 tablet by mouth 2 (two) times daily.   Yes Historical Provider, MD  cholestyramine (QUESTRAN) 4 g packet MIX AND DRINK 1 PACKET BY MOUTH TWICE A DAY IF NEEDED FOR LOOSE STOOLS Patient taking differently: Take 8 g by mouth daily after lunch. 2 packets 07/13/15  Yes Irene Shipper, MD  clopidogrel (PLAVIX) 75 MG tablet Take 1 tablet (75 mg total) by mouth daily with breakfast. 09/27/15  Yes Minus Breeding, MD  diltiazem (CARDIZEM CD) 240 MG 24 hr capsule Take 1 capsule (240 mg total) by mouth daily. 09/26/15  Yes Janece Canterbury, MD  Esomeprazole Magnesium (NEXIUM PO) Take 22.3 mg by mouth daily.   Yes Historical Provider, MD  ipratropium-albuterol (DUONEB) 0.5-2.5 (3) MG/3ML SOLN USE ONE VIAL VIA NEBULIZER  EVERY 6 HOURS AS NEEDED FOR SHORTNESS OF BREATH Patient taking differently: USE ONE VIAL VIA NEBULIZER THREE TIMES DAILY 12/17/14  Yes Deneise Lever, MD  isosorbide mononitrate (IMDUR) 60 MG 24 hr tablet Take 1 tablet (60 mg total) by mouth 2 (two) times daily. 05/19/15  Yes Shanker Kristeen Mans, MD  latanoprost (XALATAN) 0.005 % ophthalmic solution Place 1 drop into both eyes at bedtime.    Yes Historical Provider, MD  LORazepam (ATIVAN) 0.5 MG tablet Take 1 tablet (0.5 mg total) by mouth 3 (three) times daily. Patient taking differently: Take 0.5 mg by mouth 2 (two) times daily.  09/16/15  Yes Binnie Rail, MD  metroNIDAZOLE (FLAGYL) 500 MG tablet Take 1 tablet (500 mg total) by mouth 3 (three) times daily. Take for 10 days Patient taking differently: Take 500 mg by mouth 3 (three) times daily. 10 day course  started 10/15/15 10/14/15  Yes Rhonda G Barrett, PA-C  mometasone-formoterol (DULERA) 100-5 MCG/ACT AERO inhale 2 puffs by mouth once daily (THEN RINSE MOUTH AFTER USE) Patient taking differently: Inhale 2 puffs into the lungs 2 (two) times daily. RINSE MOUTH AFTER USE 08/24/15  Yes Deneise Lever, MD  montelukast (SINGULAIR) 10 MG tablet Take 1 tablet (10 mg total) by mouth at bedtime. 11/11/14  Yes Rowe Clack, MD  nitroGLYCERIN (NITROSTAT) 0.4 MG SL tablet Place 1 tablet (0.4 mg total) under the tongue every 5 (five) minutes as needed for chest pain. 07/13/14  Yes Minus Breeding, MD  pravastatin (PRAVACHOL) 80 MG tablet Take 1 tablet (80 mg total) by mouth every morning. Patient taking differently: Take 80 mg by mouth daily.  07/29/15  Yes Minus Breeding, MD  predniSONE (DELTASONE) 10 MG tablet Take 10-40 mg by mouth daily. Tapered course filled 10/05/15: take 4 tablets (40 mg) by mouth daily for 3 days, then take 3 tablets (30 mg) daily for 4 days, then take 2 tablets (20 mg) daily for 4 days, then take 1 tablet (10 mg) daily for 6 days 10/05/15  Yes Historical Provider, MD  [place] into the lungs at bedtime. CPAP/ 12   Yes Historical Provider, MD  PRESCRIPTION MEDICATION Inject 1 each into the skin once a week. Allergy shots from Dr. Annamaria Boots (1:10) - self-administered - usually Thursday or Friday   Yes Historical Provider, MD  Probiotic Product (PROBIOTIC DAILY) CAPS Take 1 capsule by mouth 2 (two) times daily.    Yes Historical Provider, MD  RA VITAMIN B-12 TR 1000 MCG TBCR take 1 tablet by mouth once daily 01/23/14  Yes Rowe Clack, MD  spironolactone (ALDACTONE) 50 MG tablet Take 1 tablet (50 mg total) by mouth daily. 09/26/15  Yes Janece Canterbury, MD  torsemide (DEMADEX) 20 MG tablet take 1 tablet by mouth three times a day Patient taking differently: take 1 tablet by mouth daily 10/13/15  Yes Binnie Rail, MD  traZODone (DESYREL) 50 MG tablet Take 0.5-1 tablets (25-50 mg total) by mouth at bedtime as needed for sleep. Patient taking differently: Take 25 mg by mouth at bedtime as needed for sleep.  09/08/15  Yes Binnie Rail, MD  valsartan (DIOVAN) 320 MG tablet Take 1 tablet (320 mg total) by mouth daily. 08/10/15  Yes Binnie Rail, MD  ipratropium (ATROVENT) 0.02 % nebulizer solution Take 2.5 mLs (0.5 mg total) by nebulization every 8 (eight) hours. Patient not taking: Reported on 10/17/2015 07/18/15   Tereasa Coop, PA-C  pantoprazole (PROTONIX) 40 MG tablet take 1 tablet by mouth twice a day Patient not taking: Reported on 10/17/2015 01/01/15   Minus Breeding, MD   BP 127/73 mmHg  Pulse 42  Temp(Src) 97.7 F (36.5 C) (Oral)  Resp 19  Ht 5\' 2"  (1.575 m)  Wt 66.34 kg  BMI 26.74 kg/m2  SpO2 99% Physical Exam  Constitutional: He is oriented to person, place, and time. He appears well-developed and well-nourished.  HENT:  Head: Normocephalic and atraumatic.  Eyes: Conjunctivae and EOM are normal. Pupils are equal, round, and reactive to light.  Neck: Normal range of motion. Neck supple.  Cardiovascular: Normal heart sounds and intact distal pulses.  An  irregularly irregular rhythm present. Bradycardia present.   Pulses:      Radial pulses are 2+ on the right side, and 2+ on the left side.  Pulmonary/Chest: Effort normal and breath sounds normal. No respiratory distress.  Abdominal: Soft. Bowel sounds are normal. There is no tenderness. There is no CVA tenderness.  Musculoskeletal: Normal range of motion.  Neurological: He is alert and oriented to person, place, and time. He has normal reflexes. No cranial nerve deficit.  Skin: Skin is warm and dry.    ED Course  Procedures (including critical care time) Labs Review Labs Reviewed  CBC WITH DIFFERENTIAL/PLATELET - Abnormal; Notable for the following:    WBC 19.6 (*)    RBC 3.90 (*)    Hemoglobin 11.6 (*)    HCT 33.7 (*)    Neutro Abs 14.8 (*)    Monocytes Absolute 1.6 (*)    All other components within normal limits  BRAIN NATRIURETIC PEPTIDE - Abnormal; Notable for the following:    B Natriuretic Peptide 150.2 (*)    All other components within normal limits  COMPREHENSIVE METABOLIC PANEL - Abnormal; Notable for the following:    Sodium 119 (*)    Potassium 5.8 (*)    Chloride 86 (*)    Glucose, Bld 115 (*)    BUN 68 (*)    Creatinine, Ser 2.00 (*)    Total Protein 5.2 (*)    Albumin 3.0 (*)    ALT 15 (*)    Alkaline Phosphatase 35 (*)    GFR calc non Af Amer 29 (*)    GFR calc Af Amer 34 (*)    All other components within normal limits  OSMOLALITY - Abnormal; Notable for the following:    Osmolality 274 (*)    All other components within normal limits  BASIC METABOLIC PANEL - Abnormal; Notable for the following:    Sodium 119 (*)    Potassium 5.4 (*)    Chloride 91 (*)    CO2 19 (*)    Glucose, Bld 140 (*)    BUN 71 (*)    Creatinine, Ser 1.87 (*)    Calcium 8.8 (*)    GFR calc non Af Amer 32 (*)    GFR calc Af Amer 37 (*)    All other components within normal limits  BLOOD GAS, ARTERIAL - Abnormal; Notable for the following:    pO2, Arterial 76.8 (*)     Acid-base deficit 3.6 (*)    All other components within normal limits  MRSA PCR SCREENING  C DIFFICILE QUICK SCREEN W PCR REFLEX  CULTURE, BLOOD (ROUTINE X 2)  CULTURE, BLOOD (ROUTINE X 2)  LIPASE, BLOOD  TSH  MAGNESIUM  SODIUM, URINE, RANDOM  CREATININE, URINE, RANDOM  OSMOLALITY, URINE  CBC  URINALYSIS, ROUTINE W REFLEX MICROSCOPIC (NOT AT [facility])  BASIC METABOLIC PANEL  BASIC METABOLIC PANEL  I-STAT TROPOININ, ED    Imaging Review Dg Chest Portable 1 View  10/17/2015  CLINICAL DATA:  Severe bradycardia today.  Weakness. EXAM: PORTABLE CHEST 1 VIEW COMPARISON:  09/25/2015 FINDINGS: External pacer paddles are noted. The cardiac silhouette, mediastinal and hilar contours are within normal limits the and stable, given the AP projection and portable technique. Stable surgical changes from triple bypass surgery. The lungs are clear. No pleural effusion. The bony thorax is intact. IMPRESSION: No acute cardiopulmonary findings. Electronically Signed   By: Marijo Sanes M.D.   On: 10/17/2015 16:13   I have personally reviewed and evaluated these images and lab results as part of my medical decision-making.   EKG Interpretation   Date/Time:  Sunday October 17 2015 15:56:26 EDT Ventricular Rate:  48 PR Interval:    QRS Duration: 139 QT Interval:  429 QTC Calculation: 384 R Axis:   44 Text Interpretation:  Atrial fibrillation IVCD, consider atypical RBBB no  discernable P waves,  narrow complex bradycardia new from previous sinus  tachycardia  Confirmed by LITTLE MD, RACHEL [id number]) on 10/17/2015 4:15:23 PM      MDM   Final diagnoses:  Bradycardia   The pt is a 82 yo male presenting with bradycardia.  EMS called to scene for generalized fatigue, weakness, and abdominal pain starting after taking medications this morning.  On arrival HR in the 20s but mentating normally with normal BP.  EMS reports possible new medication prescribed. Given glucagon with HR briefly increased to 40 but  returned to 20s.  Given 0.5 mg atropine and HR increasing to 60s.  ECG per EMS with irregularly irregular HR with no defined P waves and bradycardia.   On exam pt bradycardic at 40-50s with normal mentation and BP WNL.  Review of med list with clonidine and metoprolol. Pt with no abdominal pain, CP, or SOB one exam.  Latest cardiology note on 10/14/15 indicates no new med changes. ECG with likely a-fib with delayed AV conduction.   CBC unremarkable.  BMP with elevated CR, BUN, and K elevated.  Hyponatremia and hypochloremia.  Given atropine with mild alleviation.  Given additional glucagon without alleviation.    Discussed with cardiology and agree to admission.  Discussed with poison control and recommend possible high dose insulin pending echo for reduced EF.  Reports increased diarrhea today and given small IV bolus challenge.   Labs were viewed by myself and incorporated into medical decision making.  Discussed pertinent finding with patient or caregiver prior to admission with no further questions.  Pt care supervised by my attending Dr. Rex Kras.   Geronimo Boot, MD PGY-3 Emergency Medicine         Geronimo Boot, MD 10/18/15 [place], MD 10/18/15 [phone]

## 2015-10-17 NOTE — Progress Notes (Signed)
Pts resting HR in the 30-40s. Other VS stable and pt alert and oriented. MD aware of bradycardia and will continue to monitor.

## 2015-10-17 NOTE — ED Notes (Signed)
X-Ray at the bedside 

## 2015-10-17 NOTE — ED Notes (Signed)
MD Made aware of patient's HR. Verbalized understanding and reported to just continue to monitor at this time.

## 2015-10-17 NOTE — ED Notes (Signed)
Per EMS, Pt was found to be bradycardic after a recent change in medications. Pt had not taken all medications together until this point. Pt reported not feeling well and the EMS was called. EMS gave Glucagon with an increase in HR for one minute, pt was then given Atropine with some success. HR originally in 20s, currently 56 HR SB. Vitals per EMS: 110/40.

## 2015-10-17 NOTE — ED Notes (Signed)
Attempted Report x1.   

## 2015-10-17 NOTE — H&P (Signed)
Cardiologist: Minus Breeding, MD  CC: Weakness, symptomatic bradycardia  HPI: 82 y.o. male with a history of CAD, CABG 2008, PCI to SVG-PDA in 2012, PAD w/ PCI subclavian 2016, paroxysmal AF with high CHA2DS2-VASc score 5 (on Eliquis), normal LVEF by echo in 09/2015, was admitted with COPD exacerbation in 09/2015, was seen in the cardiology office on 10/14/2015 when reported diarrhea and due to concerns for possible c diff, was started on Metronidazole.   He reports diarrhea for many days but worse in the past couple days. No blood or fever or Vomiting but does c/o nausea. Now his main complaint is severe generalized weakness for the past few days. No syncope, angina, orthopnea, PND.   Today wife called EMS because she was unable to pick him up after he fell due to weakness (no LOC). Per report, EMS noted HR in late 20's, gave Glucagon and Atropine. In the ER also given Atropine x1 for HR in 30's at one point but otherwise staying in 40's.   Na 119, K 5.8, BUN/Cr 68/2.  WBC 19.6, H/H 11/33.     Review of Systems:  10 systems reviewed unremarkable except as noted in HPI    Past Medical History  Diagnosis Date  . CORONARY HEART DISEASE     a. s/p CABG;  b. cath 06/27/10: S-Dx occluded, S-PDA 80-90% (tx with PCI); S-OM ok, L-LAD ok;  EF 65-70%  c.  s/p Promus DES to S-PDA 06/2010;   d. Cath 8 2016 LIMA to the LAD patent, SVG to posterior lateral subtotal, SVG to diagonal occluded chronically. The patient had stenting of the native right vessel.  Marland Kitchen FIBRILLATION, ATRIAL   . SLEEP APNEA 09/2001    NPSG AHI 22/HR  . ASTHMA   . Depression   . GERD     with HH, hx esophageal stricture  . DYSLIPIDEMIA   . HYPERTENSION     Echo 3/12: EF 55-60%; mod LVH; mild AS/AI; LAE; PASP 38; mild pulmo HTN  . Personal history of alcoholism ([place])   . Diverticulosis   . Benign liver cyst   . Skin cancer     L forearm  . Cataract     surgery to both eyes  . Esophageal stricture   . Hiatal hernia   .  Pneumonia 04/2013 hosp  . Anxiety   . Arthritis   . Chronic diastolic CHF (congestive heart failure), NYHA class 2 ([place])   . COPD (chronic obstructive pulmonary disease) ([place])   . Glaucoma   . Osteoporosis   . Subclavian arterial stenosis (HCC)     No current facility-administered medications on file prior to encounter.   Current Outpatient Prescriptions on File Prior to Encounter  Medication Sig Dispense Refill  . acetaminophen (TYLENOL) 325 MG tablet Take 650 mg by mouth every 6 (six) hours as needed for moderate pain or headache.     . albuterol (PROAIR HFA) 108 (90 Base) MCG/ACT inhaler Inhale 2 puffs into the lungs every 6 (six) hours as needed for wheezing or shortness of breath. 1 Inhaler 12  . apixaban (ELIQUIS) 5 MG TABS tablet Take 1 tablet (5 mg total) by mouth 2 (two) times daily. 60 tablet 11  . bisoprolol (ZEBETA) 10 MG tablet Take 2 tablets (20 mg total) by mouth daily. 60 tablet 0  . calcium-vitamin D (OSCAL WITH D) 500-200 MG-UNIT per tablet Take 1 tablet by mouth 2 (two) times daily.    . cholestyramine (QUESTRAN) 4 g packet [place]  1 PACKET BY MOUTH TWICE A DAY IF NEEDED FOR LOOSE STOOLS (Patient taking differently: Take 8 g by mouth daily after lunch. 2 packets) 60 packet 2  . clopidogrel (PLAVIX) 75 MG tablet Take 1 tablet (75 mg total) by mouth daily with breakfast. 30 tablet 8  . diltiazem (CARDIZEM CD) 240 MG 24 hr capsule Take 1 capsule (240 mg total) by mouth daily. 30 capsule 0  . ipratropium-albuterol (DUONEB) 0.5-2.5 (3) MG/3ML SOLN USE ONE VIAL VIA NEBULIZER EVERY 6 HOURS AS NEEDED FOR SHORTNESS OF BREATH (Patient taking differently: USE ONE VIAL VIA NEBULIZER THREE TIMES DAILY) 360 mL 5  . isosorbide mononitrate (IMDUR) 60 MG 24 hr tablet Take 1 tablet (60 mg total) by mouth 2 (two) times daily. 180 tablet 0  . latanoprost (XALATAN) 0.005 % ophthalmic solution Place 1 drop into both eyes at bedtime.     Marland Kitchen LORazepam (ATIVAN) 0.5 MG tablet Take 1 tablet (0.5  mg total) by mouth 3 (three) times daily. (Patient taking differently: Take 0.5 mg by mouth 2 (two) times daily. ) 90 tablet 2  . metroNIDAZOLE (FLAGYL) 500 MG tablet Take 1 tablet (500 mg total) by mouth 3 (three) times daily. Take for 10 days (Patient taking differently: Take 500 mg by mouth 3 (three) times daily. 10 day course started 10/15/15) 30 tablet 0  . mometasone-formoterol (DULERA) 100-5 MCG/ACT AERO inhale 2 puffs by mouth once daily (THEN RINSE MOUTH AFTER USE) (Patient taking differently: Inhale 2 puffs into the lungs 2 (two) times daily. RINSE MOUTH AFTER USE) 17.6 g 5  . montelukast (SINGULAIR) 10 MG tablet Take 1 tablet (10 mg total) by mouth at bedtime. 90 tablet 3  . nitroGLYCERIN (NITROSTAT) 0.4 MG SL tablet Place 1 tablet (0.4 mg total) under the tongue every 5 (five) minutes as needed for chest pain. 25 tablet 0  . pravastatin (PRAVACHOL) 80 MG tablet Take 1 tablet (80 mg total) by mouth every morning. (Patient taking differently: Take 80 mg by mouth daily. ) 30 tablet 2  . Probiotic Product (PROBIOTIC DAILY) CAPS Take 1 capsule by mouth 2 (two) times daily.     Marland Kitchen RA VITAMIN B-12 TR 1000 MCG TBCR take 1 tablet by mouth once daily 30 tablet 11  . spironolactone (ALDACTONE) 50 MG tablet Take 1 tablet (50 mg total) by mouth daily. 30 tablet 0  . torsemide (DEMADEX) 20 MG tablet take 1 tablet by mouth three times a day (Patient taking differently: take 1 tablet by mouth daily) 90 tablet 5  . traZODone (DESYREL) 50 MG tablet Take 0.5-1 tablets (25-50 mg total) by mouth at bedtime as needed for sleep. (Patient taking differently: Take 25 mg by mouth at bedtime as needed for sleep. ) 30 tablet 3  . valsartan (DIOVAN) 320 MG tablet Take 1 tablet (320 mg total) by mouth daily. 90 tablet 3  . ipratropium (ATROVENT) 0.02 % nebulizer solution Take 2.5 mLs (0.5 mg total) by nebulization every 8 (eight) hours. (Patient not taking: Reported on 10/17/2015) 75 mL 12  . pantoprazole (PROTONIX) 40 MG  tablet take 1 tablet by mouth twice a day (Patient not taking: Reported on 10/17/2015) 60 tablet 11     Allergies  Allergen Reactions  . Ace Inhibitors Other (See Comments)    Severe asthma (COPD)  . Avelox [Moxifloxacin Hcl In Nacl] Other (See Comments)    Gum pain  . Tape Other (See Comments)    Adhesive and tape reaction-tears skin off   . Lidocaine  Other (See Comments)    Possible reaction? Dizziness, confusion, syncope  . Morphine And Related Nausea And Vomiting  . Codeine Nausea And Vomiting  . Lasix [Furosemide] Itching and Swelling  . Other Other (See Comments)    Maple trees, allergy symptoms    Social History   Social History  . Marital Status: Married    Spouse Name: N/A  . Number of Children: 2  . Years of Education: N/A   Occupational History  . retired     74 years   Social History Main Topics  . Smoking status: Former Smoker -- 3.00 packs/day for 40 years    Types: Cigarettes    Quit date: 04/11/1979  . Smokeless tobacco: Never Used  . Alcohol Use: No     Comment: quit drinking 40+ years  . Drug Use: No  . Sexual Activity: Yes   Other Topics Concern  . 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 Married lives with wife 2 children    Family History  Problem Relation Age of Onset  . COPD Sister   . Asthma Sister   . Emphysema Sister   . Hypertension Sister   . Hyperlipidemia Sister   . Hypertension Mother   . Heart disease Mother   . Hyperlipidemia Mother   . Colon cancer Neg Hx   . Heart disease Brother   . Heart disease Father   . Hyperlipidemia Father   . Hypertension Father     PHYSICAL EXAM: Filed Vitals:   10/17/15 1920 10/17/15 1930  BP: 111/42 112/37  Pulse: 45 50  Temp:    Resp: 22 16   General:  Frail appearing. No respiratory difficulty HEENT: normal Neck: supple. no JVD. Carotids 2+ bilat; no bruits. No lymphadenopathy or thryomegaly appreciated. Cor: PMI nondisplaced. Irregular rate &  rhythm. No rubs, gallops. 2/6 syst ejection murmur at the aortic area Lungs: clear Abdomen: soft, nontender, nondistended. No hepatosplenomegaly. No bruits or masses. Good bowel sounds. Extremities: no cyanosis, clubbing, rash, edema Neuro: alert & oriented x 3, cranial nerves grossly intact. moves all 4 extremities w/o difficulty. Affect pleasant.  ECG: AF V rate 48 bpm  Results for orders placed or performed during the hospital encounter of 10/17/15 (from the past 24 hour(s))  CBC with Differential     Status: Abnormal   Collection Time: 10/17/15  5:40 PM  Result Value Ref Range   WBC 19.6 (H) 4.0 - 10.5 K/uL   RBC 3.90 (L) 4.22 - 5.81 MIL/uL   Hemoglobin 11.6 (L) 13.0 - 17.0 g/dL   HCT 33.7 (L) 39.0 - 52.0 %   MCV 86.4 78.0 - 100.0 fL   MCH 29.7 26.0 - 34.0 pg   MCHC 34.4 30.0 - 36.0 g/dL   RDW 15.2 11.5 - 15.5 %   Platelets 226 150 - 400 K/uL   Neutrophils Relative % 75 %   Neutro Abs 14.8 (H) 1.7 - 7.7 K/uL   Lymphocytes Relative 17 %   Lymphs Abs 3.3 0.7 - 4.0 K/uL   Monocytes Relative 8 %   Monocytes Absolute 1.6 (H) 0.1 - 1.0 K/uL   Eosinophils Relative 0 %   Eosinophils Absolute 0.0 0.0 - 0.7 K/uL   Basophils Relative 0 %   Basophils Absolute 0.0 0.0 - 0.1 K/uL  Brain natriuretic peptide     Status: Abnormal   Collection Time: 10/17/15  5:40 PM  Result Value Ref Range   B Natriuretic Peptide  150.2 (H) 0.0 - 100.0 pg/mL  Comprehensive metabolic panel     Status: Abnormal   Collection Time: 10/17/15  5:40 PM  Result Value Ref Range   Sodium 119 (LL) 135 - 145 mmol/L   Potassium 5.8 (H) 3.5 - 5.1 mmol/L   Chloride 86 (L) 101 - 111 mmol/L   CO2 26 22 - 32 mmol/L   Glucose, Bld 115 (H) 65 - 99 mg/dL   BUN 68 (H) 6 - 20 mg/dL   Creatinine, Ser 2.00 (H) 0.61 - 1.24 mg/dL   Calcium 9.1 8.9 - 10.3 mg/dL   Total Protein 5.2 (L) 6.5 - 8.1 g/dL   Albumin 3.0 (L) 3.5 - 5.0 g/dL   AST 16 15 - 41 U/L   ALT 15 (L) 17 - 63 U/L   Alkaline Phosphatase 35 (L) 38 - 126 U/L    Total Bilirubin 1.0 0.3 - 1.2 mg/dL   GFR calc non Af Amer 29 (L) >60 mL/min   GFR calc Af Amer 34 (L) >60 mL/min   Anion gap 7 5 - 15  Lipase, blood     Status: None   Collection Time: 10/17/15  5:40 PM  Result Value Ref Range   Lipase 44 11 - 51 U/L  I-Stat Troponin, ED - 0, 3, 6 hours (not at [facility])     Status: None   Collection Time: 10/17/15  5:51 PM  Result Value Ref Range   Troponin i, poc 0.01 0.00 - 0.08 ng/mL   Comment 3           Dg Chest Portable 1 View  10/17/2015  CLINICAL DATA:  Severe bradycardia today.  Weakness. EXAM: PORTABLE CHEST 1 VIEW COMPARISON:  09/25/2015 FINDINGS: External pacer paddles are noted. The cardiac silhouette, mediastinal and hilar contours are within normal limits the and stable, given the AP projection and portable technique. Stable surgical changes from triple bypass surgery. The lungs are clear. No pleural effusion. The bony thorax is intact. IMPRESSION: No acute cardiopulmonary findings. Electronically Signed   By: Marijo Sanes M.D.   On: 10/17/2015 16:13     ASSESSMENT:  1. Symptomatic bradycardia in the setting of AF and diarrhea with severe metabolic abnormalities (AKI, hyperkalemia, hyponatremia) - stable/normal BP at this time  - no evidence of acute HF or ACS - He was also on Bisoprolol 20 mg po qd and Dilt 240 mg po qd at home along with Demadex 20 m,g po tid and Aldactone 50 mg po qd   PLAN/DISCUSSION:   - Admit to cardiology, step down  - Hold Bisoprolol, Dilt and all diuretics - Monitor lytes and renal fx - IVF NS - Check UA - Due to being on diuretics, urine lytes may not be very helpful - Pacer pads on  - Dopamine infusion 2-10 mcg for bradycardia. Will monitor for the need for temp pacemaker. He may need a permanent pacemaker in future if bradycardia persists after improvement in metabolic abnormalities - Continue Eliquis   Please see orders for details    Wandra Mannan, MD

## 2015-10-17 NOTE — Progress Notes (Signed)
Dopamine infusion ordered, consulted with pharmacy who recommends running through a large vein if central access is not available. IV team consulted and dopamine infusion held until better IV access available

## 2015-10-17 NOTE — ED Notes (Signed)
Called Pharmacy. Glucagon sent was only 1 mg. MD ordered 5 mg. Pharmacy made aware and stated they would reevaluate.

## 2015-10-17 NOTE — Progress Notes (Signed)
ANTICOAGULATION CONSULT NOTE - Initial Consult  Pharmacy Consult for Apixaban Indication: atrial fibrillation  Allergies  Allergen Reactions  . Ace Inhibitors Other (See Comments)    Severe asthma (COPD)  . Avelox [Moxifloxacin Hcl In Nacl] Other (See Comments)    Gum pain  . Tape Other (See Comments)    Adhesive and tape reaction-tears skin off   . Lidocaine Other (See Comments)    Possible reaction? Dizziness, confusion, syncope  . Morphine And Related Nausea And Vomiting  . Codeine Nausea And Vomiting  . Lasix [Furosemide] Itching and Swelling  . Other Other (See Comments)    Maple trees, allergy symptoms    Patient Measurements: Weight: 145 lb (65.772 kg)   Vital Signs: Temp: 97.7 F (36.5 C) (07/09 2021) Temp Source: Oral (07/09 2021) BP: 116/39 mmHg (07/09 2021) Pulse Rate: 50 (07/09 1930)  Labs:  Recent Labs  10/17/15 1740  HGB 11.6*  HCT 33.7*  PLT 226  CREATININE 2.00*    Estimated Creatinine Clearance: 23.8 mL/min (by C-G formula based on Cr of 2).   Medical History: Past Medical History  Diagnosis Date  . CORONARY HEART DISEASE     a. s/p CABG;  b. cath 06/27/10: S-Dx occluded, S-PDA 80-90% (tx with PCI); S-OM ok, L-LAD ok;  EF 65-70%  c.  s/p Promus DES to S-PDA 06/2010;   d. Cath 8 2016 LIMA to the LAD patent, SVG to posterior lateral subtotal, SVG to diagonal occluded chronically. The patient had stenting of the native right vessel.  Marland Kitchen FIBRILLATION, ATRIAL   . SLEEP APNEA 09/2001    NPSG AHI 22/HR  . ASTHMA   . Depression   . GERD     with HH, hx esophageal stricture  . DYSLIPIDEMIA   . HYPERTENSION     Echo 3/12: EF 55-60%; mod LVH; mild AS/AI; LAE; PASP 38; mild pulmo HTN  . Personal history of alcoholism ([place])   . Diverticulosis   . Benign liver cyst   . Skin cancer     L forearm  . Cataract     surgery to both eyes  . Esophageal stricture   . Hiatal hernia   . Pneumonia 04/2013 hosp  . Anxiety   . Arthritis   . Chronic diastolic  CHF (congestive heart failure), NYHA class 2 ([place])   . COPD (chronic obstructive pulmonary disease) ([place])   . Glaucoma   . Osteoporosis   . Subclavian arterial stenosis (HCC)     Assessment: RM is 82 yo M presenting with bradycardia and weakness, started on dopamine gtt. Pt currently on apixaban for AFib, home dose reduced due to advanced age and SCr elevated on admission to 2.0 from baseline ~1.10.   Goal of Therapy:  Monitor platelets by anticoagulation protocol: Yes   Plan:  -Apixaban 2.5 mg BID, PM dose held 7/9. -Monitor SCr, S/Sx bleeding, Plt, H/H, CBC.  Arrie Senate, PharmD PGY-1 Pharmacy Resident Pager: [phone]  Bonnita Nasuti, PharmD Clinical Pharmacist

## 2015-10-17 NOTE — ED Notes (Signed)
Pt bradycardic, resident to the bedside, atropine adminsitered.

## 2015-10-17 NOTE — ED Notes (Signed)
Patient awake and alert. Remains on the monitor. Waiting for further treatment.

## 2015-10-17 NOTE — ED Notes (Signed)
Called Pharmacy to obtain Glucagon.

## 2015-10-17 NOTE — ED Notes (Signed)
Phelbotomy at the bedside.  

## 2015-10-18 DIAGNOSIS — I5032 Chronic diastolic (congestive) heart failure: Secondary | ICD-10-CM

## 2015-10-18 DIAGNOSIS — R001 Bradycardia, unspecified: Principal | ICD-10-CM

## 2015-10-18 DIAGNOSIS — N189 Chronic kidney disease, unspecified: Secondary | ICD-10-CM

## 2015-10-18 DIAGNOSIS — N289 Disorder of kidney and ureter, unspecified: Secondary | ICD-10-CM

## 2015-10-18 DIAGNOSIS — D72829 Elevated white blood cell count, unspecified: Secondary | ICD-10-CM

## 2015-10-18 DIAGNOSIS — I2510 Atherosclerotic heart disease of native coronary artery without angina pectoris: Secondary | ICD-10-CM

## 2015-10-18 DIAGNOSIS — R197 Diarrhea, unspecified: Secondary | ICD-10-CM

## 2015-10-18 DIAGNOSIS — I480 Paroxysmal atrial fibrillation: Secondary | ICD-10-CM

## 2015-10-18 DIAGNOSIS — J441 Chronic obstructive pulmonary disease with (acute) exacerbation: Secondary | ICD-10-CM

## 2015-10-18 LAB — BLOOD GAS, ARTERIAL
ACID-BASE DEFICIT: 3.6 mmol/L — AB (ref 0.0–2.0)
Bicarbonate: 20.8 mEq/L (ref 20.0–24.0)
DRAWN BY: 236041
FIO2: 0.21
O2 Saturation: 94.3 %
PATIENT TEMPERATURE: 98.6
PCO2 ART: 37.1 mmHg (ref 35.0–45.0)
PH ART: 7.368 (ref 7.350–7.450)
TCO2: 22 mmol/L (ref 0–100)
pO2, Arterial: 76.8 mmHg — ABNORMAL LOW (ref 80.0–100.0)

## 2015-10-18 LAB — URINALYSIS, ROUTINE W REFLEX MICROSCOPIC
Bilirubin Urine: NEGATIVE
Glucose, UA: NEGATIVE mg/dL
Hgb urine dipstick: NEGATIVE
Ketones, ur: NEGATIVE mg/dL
LEUKOCYTES UA: NEGATIVE
NITRITE: NEGATIVE
PROTEIN: NEGATIVE mg/dL
Specific Gravity, Urine: 1.02 (ref 1.005–1.030)
pH: 5 (ref 5.0–8.0)

## 2015-10-18 LAB — BASIC METABOLIC PANEL
Anion gap: 8 (ref 5–15)
Anion gap: 8 (ref 5–15)
BUN: 71 mg/dL — ABNORMAL HIGH (ref 6–20)
BUN: 63 mg/dL — AB (ref 6–20)
CHLORIDE: 93 mmol/L — AB (ref 101–111)
CHLORIDE: 94 mmol/L — AB (ref 101–111)
CO2: 19 mmol/L — ABNORMAL LOW (ref 22–32)
CO2: 21 mmol/L — ABNORMAL LOW (ref 22–32)
CREATININE: 1.93 mg/dL — AB (ref 0.61–1.24)
CREATININE: 1.58 mg/dL — AB (ref 0.61–1.24)
Calcium: 8.9 mg/dL (ref 8.9–10.3)
Calcium: 8.9 mg/dL (ref 8.9–10.3)
GFR, EST AFRICAN AMERICAN: 36 mL/min — AB (ref >60)
GFR, EST AFRICAN AMERICAN: 45 mL/min — AB (ref >60)
GFR, EST NON AFRICAN AMERICAN: 31 mL/min — AB (ref >60)
GFR, EST NON AFRICAN AMERICAN: 39 mL/min — AB (ref >60)
Glucose, Bld: 123 mg/dL — ABNORMAL HIGH (ref 65–99)
Glucose, Bld: 113 mg/dL — ABNORMAL HIGH (ref 65–99)
POTASSIUM: 5.9 mmol/L — AB (ref 3.5–5.1)
Potassium: 5.9 mmol/L — ABNORMAL HIGH (ref 3.5–5.1)
SODIUM: 120 mmol/L — AB (ref 135–145)
SODIUM: 123 mmol/L — AB (ref 135–145)

## 2015-10-18 LAB — CBC
HCT: 35.6 % — ABNORMAL LOW (ref 39.0–52.0)
Hemoglobin: 12.4 g/dL — ABNORMAL LOW (ref 13.0–17.0)
MCH: 29.8 pg (ref 26.0–34.0)
MCHC: 34.8 g/dL (ref 30.0–36.0)
MCV: 85.6 fL (ref 78.0–100.0)
PLATELETS: 224 10*3/uL (ref 150–400)
RBC: 4.16 MIL/uL — AB (ref 4.22–5.81)
RDW: 15.1 % (ref 11.5–15.5)
WBC: 20.8 10*3/uL — AB (ref 4.0–10.5)

## 2015-10-18 LAB — CREATININE, URINE, RANDOM: Creatinine, Urine: 69.64 mg/dL

## 2015-10-18 LAB — TSH: TSH: 1.764 u[IU]/mL (ref 0.350–4.500)

## 2015-10-18 LAB — GLUCOSE, CAPILLARY: Glucose-Capillary: 165 mg/dL — ABNORMAL HIGH (ref 65–99)

## 2015-10-18 LAB — OSMOLALITY, URINE: Osmolality, Ur: 385 mOsm/kg (ref 300–900)

## 2015-10-18 LAB — SODIUM, URINE, RANDOM: Sodium, Ur: 12 mmol/L

## 2015-10-18 MED ORDER — ALUM & MAG HYDROXIDE-SIMETH 200-200-20 MG/5ML PO SUSP
30.0000 mL | Freq: Once | ORAL | Status: AC
Start: 2015-10-18 — End: 2015-10-18
  Administered 2015-10-18: 30 mL via ORAL
  Filled 2015-10-18: qty 30

## 2015-10-18 MED ORDER — IPRATROPIUM-ALBUTEROL 0.5-2.5 (3) MG/3ML IN SOLN
3.0000 mL | Freq: Three times a day (TID) | RESPIRATORY_TRACT | Status: DC
  Administered 2015-10-18 – 2015-10-21 (×7): 3 mL via RESPIRATORY_TRACT
  Filled 2015-10-18 (×8): qty 3

## 2015-10-18 MED ORDER — PREDNISONE 5 MG PO TABS
5.0000 mg | ORAL_TABLET | Freq: Every day | ORAL | Status: DC
  Administered 2015-10-19 – 2015-10-20 (×2): 5 mg via ORAL
  Filled 2015-10-18 (×2): qty 1

## 2015-10-18 NOTE — Progress Notes (Signed)
Pt has not voided since admission. Bladder scan reveals 350-500 mL urine in bladder. Will re-scan and in-and-out cath per MD.

## 2015-10-18 NOTE — Discharge Instructions (Addendum)

## 2015-10-18 NOTE — Progress Notes (Addendum)
Patient Name: [name]. Date of Encounter: 10/18/2015  Active Problems:   Symptomatic bradycardia   Bradycardia   Length of Stay: 1  SUBJECTIVE  Feeling much better. Alert, no complaints. HR in 80s (NSR, 1st deg AV block) on IV dopamine at 10 mcg. BP rising. Has not had a BM since admission, despite reports of >10 stools a day at home. The diarrhea improved after metronidazole was started, returned on Sunday afternoon. None since. No angina or dyspnea. Had some problems with urinary rtention last night, requiring in and out cath, now resolved - able to urinate freely.  CURRENT MEDS . apixaban  2.5 mg Oral BID  . clopidogrel  75 mg Oral Q breakfast  . latanoprost  1 drop Both Eyes QHS  . metroNIDAZOLE  500 mg Oral TID  . pravastatin  80 mg Oral q1800  . predniSONE  10 mg Oral Q breakfast    OBJECTIVE   Intake/Output Summary (Last 24 hours) at 10/18/15 1209 Last data filed at 10/18/15 1208  Gross per 24 hour  Intake 1557.93 ml  Output   2550 ml  Net -992.07 ml   Filed Weights   10/17/15 1545 10/18/15 0000  Weight: 65.772 kg (145 lb) 66.34 kg (146 lb 4.1 oz)    PHYSICAL EXAM Filed Vitals:   10/18/15 0350 10/18/15 0400 10/18/15 0804 10/18/15 1151  BP: 125/53 148/35 145/49   Pulse: 46 50 51 88  Temp: 97 F (36.1 C)  97.7 F (36.5 C)   TempSrc: Axillary  Oral   Resp: 16 24 21 17   Height:      Weight:      SpO2: 97% 96% 96% 94%   General: Alert, oriented x3, no distress Head: no evidence of trauma, PERRL, EOMI, no exophtalmos or lid lag, no myxedema, no xanthelasma; normal ears, nose and oropharynx Neck: normal jugular venous pulsations and no hepatojugular reflux; brisk carotid pulses without delay and no carotid bruits Chest: clear to auscultation, no signs of consolidation by percussion or palpation, normal fremitus, symmetrical and full respiratory excursions Cardiovascular: normal position and quality of the apical impulse, regular rhythm, normal  first and second heart sounds, no rubs or gallops, no murmur Abdomen: no tenderness or distention, no masses by palpation, no abnormal pulsatility or arterial bruits, normal bowel sounds, no hepatosplenomegaly Extremities: no clubbing, cyanosis or edema; 2+ radial, ulnar and brachial pulses bilaterally; 2+ right femoral, posterior tibial and dorsalis pedis pulses; 2+ left femoral, posterior tibial and dorsalis pedis pulses; no subclavian or femoral bruits Neurological: grossly nonfocal  LABS  CBC  Recent Labs  10/17/15 1740 10/18/15 0228  WBC 19.6* 20.8*  NEUTROABS 14.8*  --   HGB 11.6* 12.4*  HCT 33.7* 35.6*  MCV 86.4 85.6  PLT 226 [id number]   Basic Metabolic Panel  Recent Labs  10/17/15 2154  10/18/15 0228 10/18/15 0637  NA  --   < > 120* 123*  K  --   < > 5.9* 5.9*  CL  --   < > 93* 94*  CO2  --   < > 19* 21*  GLUCOSE  --   < > 123* 113*  BUN  --   < > 71* 63*  CREATININE  --   < > 1.93* 1.58*  CALCIUM  --   < > 8.9 8.9  MG 2.2  --   --   --   < > = values in this interval not displayed. Liver Function Tests  Recent Labs  10/17/15 1740  AST 16  ALT 15*  ALKPHOS 35*  BILITOT 1.0  PROT 5.2*  ALBUMIN 3.0*    Recent Labs  10/17/15 1740  LIPASE 44    Thyroid Function Tests  Recent Labs  10/17/15 2154  TSH 1.764    Radiology Studies Imaging results have been reviewed and Dg Chest Portable 1 View  10/17/2015  CLINICAL DATA:  Severe bradycardia today.  Weakness. EXAM: PORTABLE CHEST 1 VIEW COMPARISON:  09/25/2015 FINDINGS: External pacer paddles are noted. The cardiac silhouette, mediastinal and hilar contours are within normal limits the and stable, given the AP projection and portable technique. Stable surgical changes from triple bypass surgery. The lungs are clear. No pleural effusion. The bony thorax is intact. IMPRESSION: No acute cardiopulmonary findings. Electronically Signed   By: Marijo Sanes M.D.   On: 10/17/2015 16:13    TELE NSR with 1st deg AV  block.  ECG NSR with 1st deg AV block.  ASSESSMENT AND PLAN   1. Severe bradycardia - has resolved; suspect due to major electrolyte imbalances in the setting of hypovolemia and continued treatment with beta blockers and diltiazem. Wean off dopamine. He does not need a pacemaker.  2. History of atrial fibrillation - not seen on current admission. Plan to continue Eliquis. Renal function is rapidly improving, will be back on 5 mg BID dose. CHADS2VASC=5 (CAD, CHF, HTN, age x 2).   3. Chronic diastolic heart failure - he appears dry rather than overloaded. Hold diuretics.  4. CAD s/p CABG and DES August 2016 - continue clopidogrel, probably lifelong.  5. HTN - gradually reintroduce meds. Try to avoid concomitant use of beta blocker and diltiazem in this elderly gentleman with evidence of AV node disease (he may have required both due to AF with RVR? Will have to look through older records).  6. Diarrhea - the diagnosis of C.diff is uncertain. He has not had a BM since he came to the hospital almost 24 hours ago. That would be unusual even for C.  Diff treated for a few days. Hold off metronidazole and reassess. Discussed with Dr. Tommy Medal by phone.  7. COPD, recent acute exacerbation - sounds fairly clear today, no wheezing  8. Leukocytosis - possibly related to steroid use. CXR clear, normal UA.  9. Acute on chronic renal failure - due to hypovolemia, improving  10. Hyponatremia, hyperkalemia - improving with improved volume status. Avoid excessively rapid correction of sodium level. (Doubt adrenal insufficiency, more likely due to diarrhea and continued diuretic use. Has not been on steroids long/frequently. Keep this possible diagnosis in mind).     Sanda Klein, MD, [facility] [phone] office [phone] pager 10/18/2015 12:09 PM

## 2015-10-18 NOTE — Progress Notes (Signed)
In-and-out cath drained 650 mL urine from bladder

## 2015-10-19 DIAGNOSIS — E871 Hypo-osmolality and hyponatremia: Secondary | ICD-10-CM

## 2015-10-19 DIAGNOSIS — I25118 Atherosclerotic heart disease of native coronary artery with other forms of angina pectoris: Secondary | ICD-10-CM

## 2015-10-19 LAB — BASIC METABOLIC PANEL
Anion gap: 6 (ref 5–15)
BUN: 32 mg/dL — ABNORMAL HIGH (ref 6–20)
CALCIUM: 8.9 mg/dL (ref 8.9–10.3)
CHLORIDE: 94 mmol/L — AB (ref 101–111)
CO2: 29 mmol/L (ref 22–32)
CREATININE: 1.18 mg/dL (ref 0.61–1.24)
GFR calc non Af Amer: 56 mL/min — ABNORMAL LOW (ref >60)
GLUCOSE: 102 mg/dL — AB (ref 65–99)
Potassium: 4.9 mmol/L (ref 3.5–5.1)
Sodium: 129 mmol/L — ABNORMAL LOW (ref 135–145)

## 2015-10-19 LAB — GLUCOSE, CAPILLARY: Glucose-Capillary: 114 mg/dL — ABNORMAL HIGH (ref 65–99)

## 2015-10-19 MED ORDER — IRBESARTAN 300 MG PO TABS
300.0000 mg | ORAL_TABLET | Freq: Every day | ORAL | Status: DC
  Administered 2015-10-19 – 2015-10-21 (×3): 300 mg via ORAL
  Filled 2015-10-19 (×4): qty 1

## 2015-10-19 MED ORDER — BISOPROLOL FUMARATE 5 MG PO TABS
10.0000 mg | ORAL_TABLET | Freq: Every day | ORAL | Status: DC
  Administered 2015-10-19 – 2015-10-20 (×2): 10 mg via ORAL
  Filled 2015-10-19 (×2): qty 2

## 2015-10-19 NOTE — Progress Notes (Signed)
Received call from [facility]. They are active w pt. Will update them at dsich.

## 2015-10-19 NOTE — Progress Notes (Signed)
Attempted report x 2 

## 2015-10-19 NOTE — Progress Notes (Signed)
   Subjective: Pt states he is feeling well, his fatigue and abdominal pain complaints have resolved. He still has not had a bowel movement despite receiving a laxative yesterday, he says that this is usually what happens when he is in the hospital. He was unable to urinate on admission but has since been able to void. He denies chest pain or sob.   Objective: Vital signs in last 24 hours: Filed Vitals:   10/19/15 0500 10/19/15 0602 10/19/15 0800 10/19/15 0817  BP:  177/66 149/82   Pulse:  82 78   Temp:    97.9 F (36.6 C)  TempSrc:    Oral  Resp:  16 13   Height:      Weight: 139 lb (63.05 kg)     SpO2:  100% 98%    Physical Exam General: Vital signs reviewed. Patient in no acute distress. He is sitting up in bed eating breakfast  Cardiovascular: RRR, normal S1 and S2 no murmur, no JVD  Pulmonary/Chest: Clear to auscultation bilaterally, small wheeze in left upper lobe, rales, or rhonchi. Abdominal: Soft, non-tender, non-distended, BS + Extremities: No lower extremity edema, pulses symmetric and intact bilaterally. Skin: Warm, dry and intact. No rashes or erythema.  Assessment/Plan: Active Problems:   PAF (paroxysmal atrial fibrillation) (HCC)   Hx of CABG 2008   Hyponatremia   Coronary artery disease involving native coronary artery of native heart   COPD exacerbation (HCC)   Chronic diastolic CHF (congestive heart failure), NYHA class 2 (HCC)   Symptomatic bradycardia   Bradycardia  Bradycardia HR stable, currently in the 80s. May have been dt electrolyte imbalances in the setting of hypovolemia   - continue to monitor   History of atrial fibrillation currently NSR, HR in the 80s. CHA2DS2 VASc = 5  - continue eliquis   History of diastolic CHF No peripheral edema, Net I/O -2000   - hold diuretic    CAD s/p CABG 2012   - continue clopidogrel   HTN BP current 150/82   - start home bisoprolol   Diarrhea improving- no BM since admission, Cdiff negative   - hold  metronidazole   History of COPD slight wheeze in L upper lobe today, denies SOB    - continue albuterol nebs   Acute renal failure dt hypovolemia on admission BUN 68, SCr 2.0 > improved to VUN 63, SCr 1.58  - repeat CMP today   Leukocytosis and anemia pt has remained afebrile, possibly related to steroid use   - repeat CBC today   FEN IV NS 104ml/h  Hyponatremia, Hyperkalemia improving with volume status, could be dt adrenal insufficiency but more likely dt  diarrhea and diuretics   Cardiac diet  DVT PPX on Eliquis and Plavix  LOS: 2 days   Ledell Noss, MD 10/19/2015, 10:32 AM Pager: [phone]  I have seen and examined the patient along with Ledell Noss, MD.  I have reviewed the chart, notes and new data.  I agree with her note.  Key new complaints: no BM since admission, no further urinary retention Key examination changes: no overt hypervolemia, BP now high, bradycardia resolved Key new findings / data: labs pending   PLAN: Restart beta blocker and ARB. Avoid diltiazem unless he has AF with RVR not controlled by beta blocker alone. If calcium channel blocker needed for BP, prefer amlodipine. Hold diuretic and spironolactone until electrolytes normalize. Transfer to telemetry.  Sanda Klein, MD, [place] [phone] 10/19/2015, 11:44 AM

## 2015-10-20 DIAGNOSIS — Z951 Presence of aortocoronary bypass graft: Secondary | ICD-10-CM

## 2015-10-20 LAB — CBC
HEMATOCRIT: 40.3 % (ref 39.0–52.0)
HEMOGLOBIN: 13.3 g/dL (ref 13.0–17.0)
MCH: 29.4 pg (ref 26.0–34.0)
MCHC: 33 g/dL (ref 30.0–36.0)
MCV: 89 fL (ref 78.0–100.0)
Platelets: 187 10*3/uL (ref 150–400)
RBC: 4.53 MIL/uL (ref 4.22–5.81)
RDW: 15.5 % (ref 11.5–15.5)
WBC: 11.8 10*3/uL — AB (ref 4.0–10.5)

## 2015-10-20 LAB — BASIC METABOLIC PANEL
ANION GAP: 6 (ref 5–15)
BUN: 31 mg/dL — AB (ref 6–20)
CHLORIDE: 93 mmol/L — AB (ref 101–111)
CO2: 31 mmol/L (ref 22–32)
Calcium: 9 mg/dL (ref 8.9–10.3)
Creatinine, Ser: 1.12 mg/dL (ref 0.61–1.24)
GFR, EST NON AFRICAN AMERICAN: 59 mL/min — AB (ref >60)
Glucose, Bld: 117 mg/dL — ABNORMAL HIGH (ref 65–99)
POTASSIUM: 4.7 mmol/L (ref 3.5–5.1)
SODIUM: 130 mmol/L — AB (ref 135–145)

## 2015-10-20 MED ORDER — APIXABAN 5 MG PO TABS
5.0000 mg | ORAL_TABLET | Freq: Two times a day (BID) | ORAL | Status: DC
  Administered 2015-10-20 – 2015-10-21 (×3): 5 mg via ORAL
  Filled 2015-10-20 (×3): qty 1

## 2015-10-20 MED ORDER — ALUM & MAG HYDROXIDE-SIMETH 200-200-20 MG/5ML PO SUSP
30.0000 mL | ORAL | Status: DC | PRN
  Administered 2015-10-20: 30 mL via ORAL
  Filled 2015-10-20: qty 30

## 2015-10-20 NOTE — Progress Notes (Signed)
Subjective: Pt states that he is feeling well and free of complaints. He denies fatigue, abdominal pain, chest pain, sob or palpitations. He has not had a bowel movement since admission but it urinating normally. He states that he has been in and out of bed and really wishes that he could have a sponge bath.    Objective: Vital signs in last 24 hours: Filed Vitals:   10/19/15 2037 10/19/15 2104 10/20/15 0415 10/20/15 0908  BP:  166/74    Pulse:  77    Temp:  98 F (36.7 C)    TempSrc:  Oral    Resp:  20    Height:      Weight:   138 lb 3.2 oz (62.687 kg)   SpO2: 98% 98%  98%   Physical Exam  Physical Exam  Constitutional: He is well-developed, well-nourished, and in no distress.  Cardiovascular: Normal rate and regular rhythm.   No murmur heard. Pulmonary/Chest: Effort normal and breath sounds normal. No respiratory distress. He has no wheezes.  Abdominal: Soft. Bowel sounds are normal. He exhibits no distension. There is no tenderness.  Extremities: no peripheral edema, distal pulses 2+   CBC  Recent Labs  10/17/15 1740 10/18/15 0228 10/20/15 0215  WBC 19.6* 20.8* 11.8*  NEUTROABS 14.8*  --   --   HGB 11.6* 12.4* 13.3  HCT 33.7* 35.6* 40.3  MCV 86.4 85.6 89.0  PLT 226 224 [id number]   Basic Metabolic Panel  Recent Labs  10/17/15 2154  10/19/15 1211 10/20/15 0215  NA  --   < > 129* 130*  K  --   < > 4.9 4.7  CL  --   < > 94* 93*  CO2  --   < > 29 31  GLUCOSE  --   < > 102* 117*  BUN  --   < > 32* 31*  CREATININE  --   < > 1.18 1.12  CALCIUM  --   < > 8.9 9.0  MG 2.2  --   --   --   < > = values in this interval not displayed. Liver Function Tests  Recent Labs  10/17/15 1740  AST 16  ALT 15*  ALKPHOS 35*  BILITOT 1.0  PROT 5.2*  ALBUMIN 3.0*    Recent Labs  10/17/15 1740  LIPASE 44     Recent Labs  10/17/15 2154  TSH 1.764    Assessment/Plan: Active Problems:   PAF (paroxysmal atrial fibrillation) (HCC)   Hx of CABG 2008  Hyponatremia   Coronary artery disease involving native coronary artery of native heart   COPD exacerbation (HCC)   Chronic diastolic CHF (congestive heart failure), NYHA class 2 (HCC)   Symptomatic bradycardia   Bradycardia  Bradycardia HR stable, currently in the 80-90s.   -Continue telemetry monitoring   History of pAfib was in afib from 4:30-7am this morning. Remained asymptomatic and currently he has NSR. CHA2DS2 VASc =5   Continue eliquis   History of diastolic CHF no peripheral edema, Net I/O -4264. Appears to be euvolemic   - hold diuretics   CAD s/p CABG 2012   -Continue clopidogrel   HTN BP currently   Diarrhea electrolyte abnormalities are trending toward normal. Pt has not had bm since admission so we have been unable to test for cdiff but think it is unlikely with history.  - hold metronidazole   History of COPD no wheeze today, denies SOB  -Continue albuterol nebs  Acute renal failure most likely dt hypovolemia. On admission BUN 68, SCr 2.0 has improved to BUN 31, SCr 1.58  Leukocytosis and anemia pt has remained afebrile, possibly related to steroid use    FEN Hyponatremia improving with volume status.  DVT Ppx  On eliquis and plavix   LOS: 3 days   Ledell Noss, MD 10/20/2015, 11:25 AM Pager: [phone]   I have seen and examined the patient along with Ledell Noss, MD.  I have reviewed the chart, notes and new data.  I agree with her note.  Key new complaints: no dyspnea or angina. Slept well lying flat. No diarrhea - in fact still no BM since admission. Key examination changes: appears euvolemic; now in atrial fibrillation with controlled rate, but asymptomatic and well rate controlled Key new findings / data: K and renal function back to normal/baseline, Na and WBC almost normal.  PLAN: Rate control is adequate on beta blocker alone. I would avoid combining with diltiazem in the future. Good BP control on the current regimen. Had marked diuresis over last  48 hours without diuretics. Will not restart diuretics until DC and plan to DC on torsemide 20 mg daily and spironolactone 25 mg daily rather than the higher doses he was taking before, with plan for additional outpatient titration. Possible DC tomorrow.  Sanda Klein, MD, [place] [phone] 10/20/2015, 4:19 PM

## 2015-10-20 NOTE — Progress Notes (Signed)
1000 seen by cardiologist and IMRes . Aware of afib  As EKG strips reviewed.No orders left . Continue monitoring done

## 2015-10-20 NOTE — Consult Note (Signed)
   [facility] CM Inpatient Consult   10/20/2015  [place]. Aug 29, 1930 [id number]  Referral received to assess for care management services for re-admission assessment. The patient is an 82 y.o. male with a history of CAD, HX of CABG,  paroxysmal AF  was admitted with COPD exacerbation in 09/2015, was seen in the cardiology office on 10/14/2015 when reported diarrhea and due to concerns for possible c diff. per chart review.     Met with the patient regarding the benefits of [facility] services with his [facility].  Patient endorses Dr. Billey Gosling as his primary care provider.  He states he is still not feeling well but is hopeful to discharge home.  States he had a home health agency but they had never made it to his home.  Explained that [facility] is a covered benefit of insurance and could assist with community resources, health monitoring and management.  Patient states, "I think I'll be okay with what I have right now.  I have the scale that I weigh on everyday from my insurance and it will let a nurse know if something is wrong."   Review information for [facility] and a folder was provided with contact information.  Explained that [facility] does not interfere with or replace any services arranged by the inpatient care management staff.  Patient declined services with [facility] at this time.  Patient did accept a brochure and encouraged to call if he finds he needs more assistance.  Patient verbalized he would.   For questions or referrals, please contact:  Natividad Brood, RN BSN [facility] Liaison  [phone] business mobile phone Toll free office [phone]

## 2015-10-20 NOTE — Care Management Important Message (Signed)
Important Message  Patient Details  Name: [name]. MRN: [id number] Date of Birth: [birth year]   Medicare Important Message Given:  Yes    Loann Quill 10/20/2015, 8:41 AM

## 2015-10-20 NOTE — Progress Notes (Signed)
CCMD has now notified this RN that the patient converted to A fib since 04:30. Report passed to day shift RN. Will continue to monitor.

## 2015-10-21 ENCOUNTER — Encounter (HOSPITAL_COMMUNITY): Payer: Self-pay | Admitting: Physician Assistant

## 2015-10-21 ENCOUNTER — Other Ambulatory Visit: Payer: Self-pay | Admitting: Physician Assistant

## 2015-10-21 DIAGNOSIS — N189 Chronic kidney disease, unspecified: Principal | ICD-10-CM

## 2015-10-21 DIAGNOSIS — N179 Acute kidney failure, unspecified: Secondary | ICD-10-CM

## 2015-10-21 DIAGNOSIS — E875 Hyperkalemia: Secondary | ICD-10-CM

## 2015-10-21 DIAGNOSIS — N182 Chronic kidney disease, stage 2 (mild): Secondary | ICD-10-CM

## 2015-10-21 DIAGNOSIS — R197 Diarrhea, unspecified: Secondary | ICD-10-CM

## 2015-10-21 LAB — BASIC METABOLIC PANEL
ANION GAP: 7 (ref 5–15)
BUN: 31 mg/dL — ABNORMAL HIGH (ref 6–20)
CALCIUM: 9.1 mg/dL (ref 8.9–10.3)
CO2: 28 mmol/L (ref 22–32)
Chloride: 97 mmol/L — ABNORMAL LOW (ref 101–111)
Creatinine, Ser: 0.98 mg/dL (ref 0.61–1.24)
Glucose, Bld: 102 mg/dL — ABNORMAL HIGH (ref 65–99)
POTASSIUM: 4.8 mmol/L (ref 3.5–5.1)
Sodium: 132 mmol/L — ABNORMAL LOW (ref 135–145)

## 2015-10-21 MED ORDER — TORSEMIDE 20 MG PO TABS
20.0000 mg | ORAL_TABLET | Freq: Every day | ORAL | Status: DC
  Administered 2015-10-21: 20 mg via ORAL
  Filled 2015-10-21: qty 1

## 2015-10-21 MED ORDER — SPIRONOLACTONE 25 MG PO TABS: 25.0000 mg | ORAL_TABLET | Freq: Every day | ORAL | Status: DC

## 2015-10-21 MED ORDER — FAMOTIDINE 20 MG PO TABS: 20.0000 mg | ORAL_TABLET | Freq: Every day | ORAL | Status: DC

## 2015-10-21 MED ORDER — SPIRONOLACTONE 25 MG PO TABS
25.0000 mg | ORAL_TABLET | Freq: Every day | ORAL | Status: DC
  Administered 2015-10-21: 25 mg via ORAL
  Filled 2015-10-21: qty 1

## 2015-10-21 MED ORDER — ISOSORBIDE MONONITRATE ER 60 MG PO TB24
60.0000 mg | ORAL_TABLET | Freq: Two times a day (BID) | ORAL | Status: DC
  Administered 2015-10-21: 60 mg via ORAL
  Filled 2015-10-21: qty 1

## 2015-10-21 MED ORDER — MAGNESIUM HYDROXIDE 400 MG/5ML PO SUSP: 15.0000 mL | Freq: Every day | ORAL | Status: DC | PRN

## 2015-10-21 MED ORDER — BISOPROLOL FUMARATE 5 MG PO TABS
20.0000 mg | ORAL_TABLET | Freq: Every day | ORAL | Status: DC
  Administered 2015-10-21: 20 mg via ORAL
  Filled 2015-10-21: qty 4

## 2015-10-21 MED ORDER — PANTOPRAZOLE SODIUM 40 MG PO TBEC: 40.0000 mg | DELAYED_RELEASE_TABLET | Freq: Two times a day (BID) | ORAL | Status: DC

## 2015-10-21 MED ORDER — ALBUTEROL SULFATE (2.5 MG/3ML) 0.083% IN NEBU: 2.5000 mg | INHALATION_SOLUTION | RESPIRATORY_TRACT | Status: DC | PRN

## 2015-10-21 MED ORDER — TORSEMIDE 20 MG PO TABS: 20.0000 mg | ORAL_TABLET | Freq: Every day | ORAL | Status: DC

## 2015-10-21 MED ORDER — FAMOTIDINE 20 MG PO TABS
20.0000 mg | ORAL_TABLET | Freq: Every day | ORAL | Status: DC
  Administered 2015-10-21: 20 mg via ORAL
  Filled 2015-10-21: qty 1

## 2015-10-21 MED ORDER — IPRATROPIUM-ALBUTEROL 0.5-2.5 (3) MG/3ML IN SOLN: 3.0000 mL | Freq: Two times a day (BID) | RESPIRATORY_TRACT | Status: DC

## 2015-10-21 NOTE — Care Management Note (Signed)
Case Management Note  Patient Details  Name: [name]. MRN: [id number] Date of Birth: 1930/11/24  Subjective/Objective:            Admitted with Symptomatic Bradycardia        Action/Plan: Patient lives at home with his spouse, PCP is Dr Quay Burow; private insurance with [facility]; Talked to [place] with [facility]- which was arranged at his PCP office; patient does not want any HHC at this time; He is connected to the [facility] program, goes to Outpatient physical therapy twice a week and Silver Sneakers at the [facility]; He also takes care of his spouse at home.  Expected Discharge Date:     10/21/2015             Expected Discharge Plan:  Home/Self Care  Discharge planning Services  CM Consult Choice offered to:  Patient  HH Arranged:  Patient Refused  Sherrilyn Rist [phone] 10/21/2015, 10:24 AM

## 2015-10-21 NOTE — Progress Notes (Signed)
Patient: [name]. / Admit Date: 10/17/2015 / Date of Encounter: 10/21/2015, 9:29 AM   Subjective: Feeling much better. No dizziness or weakness. Had his typical sx of acid reflux last night, relieved with Maalox. This typically occurs after eating. His protonix has been on hold.   Objective: Telemetry: PAF- currently afib, HR 70s-80s. Occasionally up to the 120s in spikes Physical Exam: Blood pressure 121/65, pulse 80, temperature 97.8 F (36.6 C), temperature source Oral, resp. rate 18, height 5\' 2"  (1.575 m), weight 137 lb (62.143 kg), SpO2 99 %. General: Well developed, well nourished WM in no acute distress. Head: Normocephalic, atraumatic, sclera non-icteric, no xanthomas, nares are without discharge. Neck: Negative for carotid bruits. JVP not elevated. Lungs: Clear bilaterally to auscultation without wheezes, rales, or rhonchi. Breathing is unlabored. Heart: RRR S1 S2 without rubs or gallops. + soft SEM RUSB. Abdomen: Soft, non-tender, non-distended with normoactive bowel sounds. No rebound/guarding. Extremities: No clubbing or cyanosis. No edema. Distal pedal pulses are 2+ and equal bilaterally. Neuro: Alert and oriented X 3. Moves all extremities spontaneously. Psych:  Responds to questions appropriately with a normal affect.   Intake/Output Summary (Last 24 hours) at 10/21/15 0929 Last data filed at 10/21/15 0929  Gross per 24 hour  Intake    700 ml  Output   1175 ml  Net   -475 ml    Inpatient Medications:  . apixaban  5 mg Oral BID  . bisoprolol  10 mg Oral Daily  . clopidogrel  75 mg Oral Q breakfast  . ipratropium-albuterol  3 mL Nebulization TID  . irbesartan  300 mg Oral Daily  . latanoprost  1 drop Both Eyes QHS  . pantoprazole  40 mg Oral BID  . pravastatin  80 mg Oral q1800  . predniSONE  5 mg Oral Q breakfast   Infusions:    Labs:  Recent Labs  10/20/15 0215 10/21/15 0502  NA 130* 132*  K 4.7 4.8  CL 93* 97*  CO2 31 28  GLUCOSE 117* 102*   BUN 31* 31*  CREATININE 1.12 0.98  CALCIUM 9.0 9.1   No results for input(s): AST, ALT, ALKPHOS, BILITOT, PROT, ALBUMIN in the last 72 hours.  Recent Labs  10/20/15 0215  WBC 11.8*  HGB 13.3  HCT 40.3  MCV 89.0  PLT 187   No results for input(s): CKTOTAL, CKMB, TROPONINI in the last 72 hours. Invalid input(s): POCBNP No results for input(s): HGBA1C in the last 72 hours.   Radiology/Studies:  Dg Chest 2 View  09/22/2015  CLINICAL DATA:  Shortness of breath, wheezing, tachycardia EXAM: CHEST  2 VIEW COMPARISON:  05/17/2015 FINDINGS: Cardiomediastinal silhouette is stable. Status post CABG. No acute infiltrate or pleural effusion. No pulmonary edema. Osteopenia and mild degenerative changes thoracic spine. IMPRESSION: No active cardiopulmonary disease. Electronically Signed   By: Lahoma Crocker M.D.   On: 09/22/2015 20:05   Dg Chest Portable 1 View  10/17/2015  CLINICAL DATA:  Severe bradycardia today.  Weakness. EXAM: PORTABLE CHEST 1 VIEW COMPARISON:  09/25/2015 FINDINGS: External pacer paddles are noted. The cardiac silhouette, mediastinal and hilar contours are within normal limits the and stable, given the AP projection and portable technique. Stable surgical changes from triple bypass surgery. The lungs are clear. No pleural effusion. The bony thorax is intact. IMPRESSION: No acute cardiopulmonary findings. Electronically Signed   By: Marijo Sanes M.D.   On: 10/17/2015 16:13   Dg Chest Port 1 View  09/25/2015  CLINICAL  DATA:  Dyspnea. EXAM: PORTABLE CHEST 1 VIEW COMPARISON:  Radiograph of September 22, 2015. FINDINGS: The heart size and mediastinal contours are within normal limits. No pneumothorax or pleural effusion is noted. Status post coronary artery bypass graft. Both lungs are clear. The visualized skeletal structures are unremarkable. IMPRESSION: No acute cardiopulmonary abnormality seen. Electronically Signed   By: Marijo Conception, M.D.   On: 09/25/2015 09:51     Assessment and  Plan  58M with CAD (CABG 2008, PCI to SVG-PDA in 2012, NSTEMI s/p DES to native RCA 11/2014), PAD w/ PCI subclavian 2016, paroxysmal AF with high CHA2DS2-VASc score 5 (on Eliquis), chronic diastolic CHF, suspected CKD stage II, normal LVEF by echo in 09/2015, mild AS by echo 09/2014, OSA, asthma, depression, GERD, dyslipidemia, HTN, personal history of alcoholism, h/o esophageal stricture, anxiety, hiatal hernia, COPD who was admitted 10/17/2015 with weakness and bradycardia. He was recently admitted with COPD exacerbation 09/2015. He was seen in the cardiology office 10/14/15 with diarrhea concerning for C diff and was started on metronidazole. He presented with continued diarrhea and weakness without LOC - per EMS HR was in the high 20's, received glucagon and atropine; also given atropine x 1 in the ER for HR in the 30s at one point. Labs notable for hyponatremia, hyperkalemia, acute kidney injury, marked leukocytosis. Tx with IVF,  IV dopamine, cessation of AVN blockers with improvement. Tele with intermittent PAF. TSH wnl.  1. Severe bradycardia - suspect due to major electrolyte imbalances (hyperkalemia) in the setting of hypovolemia and continued treatment with beta blockers and diltiazem. Does not require pacemaker at this time. Rate control back on board with BB alone - would not combine with diltiazem in the future.  2. Paroxysmal atrial fibrillation - asymptomatic. Continue Eliquis, BB. Will review consideration with MD whether to increase bisoprolol back to 20mg  daily.  3. Chronic diastolic heart failure - will review plan for diuretics with MD.  4. CAD s/p CABG and DES August 2016 - continue clopidogrel. Duration per primary cardiologist.  5. HTN - adequate control.  6. Diarrhea - the diagnosis of C.diff is uncertain. He has not had a BM since he came to the hospital. That would be unusual even for C. Diff treated for a few days. Hold off metronidazole and follow for now. He has since had normal  stooling.  7. AKI on CKD stage II with profound electrolyte disturbances including hyponatremia, hyperkalemia - due to hypovolemia. Improved back to baseline.  8. GERD - r/t meals, improved with GI cocktail. Resume home protonix.  Has f/u 11/04/15 with Dr. Percival Spanish already.    Signed, Melina Copa PA-C Pager: [phone]    I have seen and examined the patient along with Melina Copa PA-C.  I have reviewed the chart, notes and new data.  I agree with PA/NP's note.  Key new complaints: improved except GERD symptoms. Had solid BM Key examination changes: No overt CHF, rate well controlled most of the time, occ RVR Key new findings / data: Na [id number], other metabolic parameters corrected.  PLAN: Resume diuretics in lower doses. Stop prednisone. Increase bisoprolol, keep off diltiazem. Try H2 blocker instead of PPI, as this may be contributing to previous diarrhea. Note his decrease in weight - if this continues may have to consider a reduction in his Eliquis dose.  Sanda Klein, MD, [place] [phone] 10/21/2015, 10:28 AM

## 2015-10-21 NOTE — Progress Notes (Signed)
Pt refusing to have bed alarm turned on.  States "I will call for help when I am ready to get up".  Karie Kirks, Therapist, sports.

## 2015-10-21 NOTE — Progress Notes (Signed)
Pt stated mild indigestion last evening. Maalox ordered per Cardiology PRN standing orders and given. Will continue to monitor.

## 2015-10-21 NOTE — Discharge Summary (Signed)
Discharge Summary    Patient ID: [name].,  MRN: [id number], DOB/AGE: 80-22-1932 80 y.o.  Admit date: 10/17/2015 Discharge date: 10/21/2015  Primary Care Provider: Binnie Rail Primary Cardiologist: Hochrein  Discharge Diagnoses    Principal Problem:   Symptomatic bradycardia Active Problems:   GERD   Obstructive sleep apnea   PAF (paroxysmal atrial fibrillation) (HCC)   Hx of CABG 2008   Hyponatremia   Essential hypertension   Coronary artery disease involving native coronary artery of native heart   COPD (chronic obstructive pulmonary disease) (HCC)   Chronic diastolic CHF (congestive heart failure), NYHA class 2 (HCC)   Acute kidney injury superimposed on chronic kidney disease (HCC)   Hyperkalemia   Diarrhea   Diagnostic Studies/Procedures    N/A _____________   History of Present Illness & Hospital Course    [name] is an 82M with CAD (CABG 2008, PCI to SVG-PDA in 2012, NSTEMI s/p DES to native RCA 11/2014), PAD w/ PCI subclavian 2016, paroxysmal AF with high CHA2DS2-VASc score 5 (on Eliquis), chronic diastolic CHF, suspected CKD stage II, normal LVEF by echo in 09/2015, mild AS by echo 09/2014, OSA, asthma, depression, GERD, dyslipidemia, HTN, personal history of alcoholism, h/o esophageal stricture, anxiety, hiatal hernia, COPD who was admitted 10/17/2015 with weakness and bradycardia.   To recap recent hx, he was recently admitted with COPD exacerbation 09/2015. He was seen in the cardiology office 10/14/15 with diarrhea concerning for C diff and was started on metronidazole. He reported >10 stools a day at home. The diarrhea improved after metronidazole was started then returned on Sunday afternoon (7/9). The day of admission, he presented with weakness without LOC. He fell due to weakness and his wife was unable to pick him up so she called 911. Per EMS HR was in the high 20's, received glucagon and atropine; also given atropine x 1 in the ER for HR in the 30s  at one point. His HR was in the 40s at time of cardiology evaluation. Labs notable for hyponatremia Na 119, hyperkalemia K 5.8, acute kidney injury BUN/Cr 68/2 (prev 1.1), marked leukocytosis 19.6k. He was admitted and was treated with IVF andIV dopamine drip. His bradycardia was felt likely due to electrolyte derangement rather than adrenal insufficiency in the setting of recent steroid taper use. His antihypertensives, diuretics, and AVN blocking agents were held. TSH was WNL. Over the next several days he continued to improve clinically. Interestingly he did not have a single BM in the hospital until yesterday 7/12 - Dr. Sallyanne Kuster had discussed this with Dr. Tommy Medal who felt the dx of C Diff thus was uncertain and unlikely in presentation. Metronidazole was held. He had a normal stool caliber yesterday and this AM without further diarrhea. While on telemetry, telemetry showed paroxysmal afib for which the patient was asymptomatic. His beta blocker was restarted and titrated back to home dose - Dr. Loletha Grayer recommended to avoid diltiazem unless he were to have AF RVR in the future, since beta blocker seemed to provide adequate control. Today we will restart his torsemide at 20mg  daily (this is what he was taking at home despite prior rx of TID) and half of his home dose of spironolactone (25mg  daily). He was also started on Zantac daily for GERD sx (improved with Maalox as inpatient) - PPI was stopped due to diarrhea. By day of discharge his lab indices are much improved - WBC down to 11.8, Na 132, K 4.8, BUN  31, Cr 0.98. Weight is 137lb. Dr. Sallyanne Kuster has seen and examined the patient today and feels he is stable for discharge. He feels the patient has adequately completed prednisone course and has discontinued further dosing. We will obtain a BMET on Monday 7/17 at [facility] and keep his planned follow-up 11/04/15 with Dr. Percival Spanish. We have also asked him to f/u with his PCP within 1 week of discharge to monitor for  further bowel issues.  It should be noted that he is on the border of Eliquis dosing so if his weight drops further <60kg, would consider dose reduction to 2.5mg  BID.  Med changes this admission outlined to the patient on AVS: STOP taking diltiazem, esomeprazole, metronidazole, prednisone, and Protonix. The doses of your spironolactone and torsemide have changed - NEW PRESCRIPTION. Start taking famotidine for acid reflux.  Consultants: N/A _____________  Discharge Vitals Blood pressure 121/65, pulse 80, temperature 97.8 F (36.6 C), temperature source Oral, resp. rate 18, height 5\' 2"  (1.575 m), weight 137 lb (62.143 kg), SpO2 99 %.  Filed Weights   10/19/15 1657 10/20/15 0415 10/21/15 0439  Weight: 139 lb 5.3 oz (63.2 kg) 138 lb 3.2 oz (62.687 kg) 137 lb (62.143 kg)    Labs & Radiologic Studies    CBC  Recent Labs  10/20/15 0215  WBC 11.8*  HGB 13.3  HCT 40.3  MCV 89.0  PLT 123XX123   Basic Metabolic Panel  Recent Labs  10/20/15 0215 10/21/15 0502  NA 130* 132*  K 4.7 4.8  CL 93* 97*  CO2 31 28  GLUCOSE 117* 102*  BUN 31* 31*  CREATININE 1.12 0.98  CALCIUM 9.0 9.1   ____________  Dg Chest 2 View  09/22/2015  CLINICAL DATA:  Shortness of breath, wheezing, tachycardia EXAM: CHEST  2 VIEW COMPARISON:  05/17/2015 FINDINGS: Cardiomediastinal silhouette is stable. Status post CABG. No acute infiltrate or pleural effusion. No pulmonary edema. Osteopenia and mild degenerative changes thoracic spine. IMPRESSION: No active cardiopulmonary disease. Electronically Signed   By: Lahoma Crocker M.D.   On: 09/22/2015 20:05   Dg Chest Portable 1 View  10/17/2015  CLINICAL DATA:  Severe bradycardia today.  Weakness. EXAM: PORTABLE CHEST 1 VIEW COMPARISON:  09/25/2015 FINDINGS: External pacer paddles are noted. The cardiac silhouette, mediastinal and hilar contours are within normal limits the and stable, given the AP projection and portable technique. Stable surgical changes from triple  bypass surgery. The lungs are clear. No pleural effusion. The bony thorax is intact. IMPRESSION: No acute cardiopulmonary findings. Electronically Signed   By: Marijo Sanes M.D.   On: 10/17/2015 16:13   Dg Chest Port 1 View  09/25/2015  CLINICAL DATA:  Dyspnea. EXAM: PORTABLE CHEST 1 VIEW COMPARISON:  Radiograph of September 22, 2015. FINDINGS: The heart size and mediastinal contours are within normal limits. No pneumothorax or pleural effusion is noted. Status post coronary artery bypass graft. Both lungs are clear. The visualized skeletal structures are unremarkable. IMPRESSION: No acute cardiopulmonary abnormality seen. Electronically Signed   By: Marijo Conception, M.D.   On: 09/25/2015 09:51   Disposition   Pt is being discharged home today in good condition.  Follow-up Plans & Appointments    Follow-up Information    Follow up with [facility].   Why:  Please go to the lab on Monday 10/25/15 to redraw a BMET (electrolytes, kidney function) - they are open from 8am-12:30pm and 1-4:30pm.   Contact information:   [id number] [place], [place] [address]  [phone] (Phone)      Follow up with Minus Breeding, MD.   Specialty:  Cardiology   Why:  [facility] - 11/04/15 at 3:45pm - [place] office   Contact information:   [address] [place] [address] [phone]       Follow up with Binnie Rail, MD.   Specialty:  Internal Medicine   Why:  Please follow up with primary care doctor within 1 week.   Contact information:   [address] [place] [address] [phone]      Discharge Instructions    Diet - low sodium heart healthy    Complete by:  As directed      Increase activity slowly    Complete by:  As directed   STOP taking diltiazem, esomeprazole (Nexium), metronidazole, prednisone, and Protonix. The doses of your spironolactone and torsemide have changed - NEW PRESCRIPTION. Start taking famotidine for acid reflux.  No driving until  cleared by your doctor.           Discharge Medications   Current Discharge Medication List    START taking these medications   Details  famotidine (PEPCID) 20 MG tablet Take 1 tablet (20 mg total) by mouth daily. Qty: 30 tablet, Refills: 1      CONTINUE these medications which have CHANGED   Details  spironolactone (ALDACTONE) 25 MG tablet Take 1 tablet (25 mg total) by mouth daily. Qty: 30 tablet, Refills: 2    torsemide (DEMADEX) 20 MG tablet Take 1 tablet (20 mg total) by mouth daily. Qty: 30 tablet, Refills: 2      CONTINUE these medications which have NOT CHANGED   Details  acetaminophen (TYLENOL) 325 MG tablet Take 650 mg by mouth every 6 (six) hours as needed for moderate pain or headache.     albuterol (PROAIR HFA) 108 (90 Base) MCG/ACT inhaler Inhale 2 puffs into the lungs every 6 (six) hours as needed for wheezing or shortness of breath.     apixaban (ELIQUIS) 5 MG TABS tablet Take 1 tablet (5 mg total) by mouth 2 (two) times daily.     bisoprolol (ZEBETA) 10 MG tablet Take 2 tablets (20 mg total) by mouth daily.     calcium-vitamin D (OSCAL WITH D) 500-200 MG-UNIT per tablet Take 1 tablet by mouth 2 (two) times daily.    cholestyramine (QUESTRAN) 4 g packet MIX AND DRINK 1 PACKET BY MOUTH TWICE A DAY IF NEEDED FOR LOOSE STOOLS     clopidogrel (PLAVIX) 75 MG tablet Take 1 tablet (75 mg total) by mouth daily with breakfast.     ipratropium-albuterol (DUONEB) 0.5-2.5 (3) MG/3ML SOLN USE ONE VIAL VIA NEBULIZER EVERY 6 HOURS AS NEEDED FOR SHORTNESS OF BREATH     isosorbide mononitrate (IMDUR) 60 MG 24 hr tablet Take 1 tablet (60 mg total) by mouth 2 (two) times daily.     latanoprost (XALATAN) 0.005 % ophthalmic solution Place 1 drop into both eyes at bedtime.     LORazepam (ATIVAN) 0.5 MG tablet Take 0.5 mg by mouth 2 (two) times daily.    mometasone-formoterol (DULERA) 100-5 MCG/ACT AERO Inhale 2 puffs into the lungs 2 (two) times daily. Rinse mouth  after use    montelukast (SINGULAIR) 10 MG tablet Take 1 tablet (10 mg total) by mouth at bedtime.     nitroGLYCERIN (NITROSTAT) 0.4 MG SL tablet Place 1 tablet (0.4 mg total) under the tongue every 5 (five) minutes as needed for chest pain.  pravastatin (PRAVACHOL) 80 MG tablet Take 1 tablet (80 mg total) by mouth every morning.     !! PRESCRIPTION MEDICATION Inhale into the lungs at bedtime. CPAP/ 12    !! PRESCRIPTION MEDICATION Inject 1 each into the skin once a week. Allergy shots from Dr. Annamaria Boots (1:10) - self-administered - usually Thursday or Friday    Probiotic Product (PROBIOTIC DAILY) CAPS Take 1 capsule by mouth 2 (two) times daily.     RA VITAMIN B-12 TR 1000 MCG TBCR take 1 tablet by mouth once daily     traZODone (DESYREL) 50 MG tablet Take 0.5-1 tablets (25-50 mg total) by mouth at bedtime as needed for sleep.     valsartan (DIOVAN) 320 MG tablet Take 1 tablet (320 mg total) by mouth daily.      !! - Potential duplicate medications found. Please discuss with provider.    STOP taking these medications     diltiazem (CARDIZEM CD) 240 MG 24 hr capsule      Esomeprazole Magnesium (NEXIUM PO)      metroNIDAZOLE (FLAGYL) 500 MG tablet      predniSONE (DELTASONE) 10 MG tablet      pantoprazole (PROTONIX) 40 MG tablet          Allergies:  Allergies  Allergen Reactions  . Ace Inhibitors Other (See Comments)    Severe asthma (COPD)  . Avelox [Moxifloxacin Hcl In Nacl] Other (See Comments)    Gum pain  . Tape Other (See Comments)    Adhesive and tape reaction-tears skin off   . Lidocaine Other (See Comments)    Possible reaction? Dizziness, confusion, syncope  . Morphine And Related Nausea And Vomiting  . Codeine Nausea And Vomiting  . Lasix [Furosemide] Itching and Swelling  . Other Other (See Comments)    Maple trees, allergy symptoms    Outstanding Labs/Studies   BMET on 10/25/15  Duration of Discharge Encounter   Greater than 30 minutes  including physician time.  Signed, Nedra Hai Dunn PA-C 10/21/2015, 10:12 AM

## 2015-10-22 ENCOUNTER — Telehealth: Payer: Self-pay | Admitting: *Deleted

## 2015-10-22 LAB — CULTURE, BLOOD (ROUTINE X 2)
CULTURE: NO GROWTH
Culture: NO GROWTH

## 2015-10-22 NOTE — Telephone Encounter (Signed)
A user error has taken place.

## 2015-10-25 DIAGNOSIS — N189 Chronic kidney disease, unspecified: Secondary | ICD-10-CM | POA: Diagnosis not present

## 2015-10-25 DIAGNOSIS — N179 Acute kidney failure, unspecified: Secondary | ICD-10-CM | POA: Diagnosis not present

## 2015-10-25 LAB — BASIC METABOLIC PANEL
BUN: 26 mg/dL — ABNORMAL HIGH (ref 7–25)
CALCIUM: 8.8 mg/dL (ref 8.6–10.3)
CHLORIDE: 92 mmol/L — AB (ref 98–110)
CO2: 29 mmol/L (ref 20–31)
Creat: 1.25 mg/dL — ABNORMAL HIGH (ref 0.70–1.11)
Glucose, Bld: 98 mg/dL (ref 65–99)
Potassium: 4.7 mmol/L (ref 3.5–5.3)
SODIUM: 130 mmol/L — AB (ref 135–146)

## 2015-10-26 ENCOUNTER — Encounter: Payer: Self-pay | Admitting: Adult Health

## 2015-10-26 ENCOUNTER — Ambulatory Visit (INDEPENDENT_AMBULATORY_CARE_PROVIDER_SITE_OTHER): Payer: Medicare Other | Admitting: Adult Health

## 2015-10-26 ENCOUNTER — Telehealth: Payer: Self-pay | Admitting: Internal Medicine

## 2015-10-26 VITALS — BP 134/62 | HR 78 | Temp 98.0°F | Ht 62.0 in | Wt 144.0 lb

## 2015-10-26 DIAGNOSIS — J449 Chronic obstructive pulmonary disease, unspecified: Secondary | ICD-10-CM

## 2015-10-26 DIAGNOSIS — G4733 Obstructive sleep apnea (adult) (pediatric): Secondary | ICD-10-CM | POA: Diagnosis not present

## 2015-10-26 NOTE — Assessment & Plan Note (Signed)
Continue on CPAP  

## 2015-10-26 NOTE — Telephone Encounter (Signed)
Katie please advise of an appt slot that we can use for this pt.  CY schedule is a little full.  Thanks.

## 2015-10-26 NOTE — Assessment & Plan Note (Signed)
COPD/Chronic Asthma w /recent flare now improving   Plan  Continue on Dulera 2 puffs Twice daily   Use Duoneb As needed  For wheezing .  Increase Pepcid 20mg  Twice daily   GERD diet.  Continue on CPAP At bedtime  .  follow up Dr. Annamaria Boots  In 2 months and As needed

## 2015-10-26 NOTE — Progress Notes (Signed)
Subjective:    Patient ID: Stephen Door., male    DOB: 12/27/1930, 82 y.o.   MRN: [id number]  HPI 82 yo male with severe chronic asthma , sleep apnea on CPAP , [place]   10/26/2015 Post hospital follow up  Pt returns For a post hospital follow-up. Patient has had several hospitalizations over the last few months. He was admitted last month for COPD exacerbation treated with antibiotics and steroids. Re- Admitted last week for hypovolemia w/ diarrhea , electrolyte imbalance and symptomatic bradycardia He was treated with IVF hydration. He did require atropine. And electrolyte correction. PPI was stopped due to diarrehea. He is feeling better . Remains weak. Breathing is at baseline. Gets winded easily with minimal activity . Has daily dry cough. No increased wheezing. He is on [place] Twice daily  . Uses Duoneb -3 times a day. Denies chest pain, orthopnea,edema or fever.   PVX and Prevnar are utd.   Past Medical History  Diagnosis Date  . CAD (coronary artery disease)     a. s/p CABG;  b. cath 06/27/10: S-Dx occluded, S-PDA 80-90% (tx with PCI); S-OM ok, L-LAD ok;  EF 65-70%  c.  s/p Promus DES to S-PDA 06/2010;   d. Cath 8 2016 LIMA to the LAD patent, SVG to posterior lateral subtotal, SVG to diagonal occluded chronically. The patient had stenting of the native right vessel.  Marland Kitchen PAF (paroxysmal atrial fibrillation) ([place])   . SLEEP APNEA 09/2001    NPSG AHI 22/HR  . ASTHMA   . Depression   . GERD     with HH, hx esophageal stricture  . DYSLIPIDEMIA   . Essential hypertension     Echo 3/12: EF 55-60%; mod LVH; mild AS/AI; LAE; PASP 38; mild pulmo HTN  . Personal history of alcoholism ([place])   . Diverticulosis   . Benign liver cyst   . Skin cancer     L forearm  . Cataract     surgery to both eyes  . Esophageal stricture   . Hiatal hernia   . Pneumonia 04/2013 hosp  . Anxiety   . Arthritis   . Chronic diastolic CHF (congestive heart failure), NYHA class 2 ([place])   . COPD (chronic  obstructive pulmonary disease) ([place])   . Glaucoma   . Osteoporosis   . Subclavian arterial stenosis (HCC)     a. s/p PTCA of subclavian 2016.  Marland Kitchen CKD (chronic kidney disease), stage II    Current Outpatient Prescriptions on File Prior to Visit  Medication Sig Dispense Refill  . acetaminophen (TYLENOL) 325 MG tablet Take 650 mg by mouth every 6 (six) hours as needed for moderate pain or headache.     . albuterol (PROAIR HFA) 108 (90 Base) MCG/ACT inhaler Inhale 2 puffs into the lungs every 6 (six) hours as needed for wheezing or shortness of breath. 1 Inhaler 12  . apixaban (ELIQUIS) 5 MG TABS tablet Take 1 tablet (5 mg total) by mouth 2 (two) times daily. 60 tablet 11  . bisoprolol (ZEBETA) 10 MG tablet Take 2 tablets (20 mg total) by mouth daily. 60 tablet 0  . calcium-vitamin D (OSCAL WITH D) 500-200 MG-UNIT per tablet Take 1 tablet by mouth 2 (two) times daily.    . cholestyramine (QUESTRAN) 4 g packet MIX AND DRINK 1 PACKET BY MOUTH TWICE A DAY IF NEEDED FOR LOOSE STOOLS (Patient taking differently: Take 8 g by mouth daily after lunch. 2 packets) 60 packet 2  . clopidogrel (  PLAVIX) 75 MG tablet Take 1 tablet (75 mg total) by mouth daily with breakfast. 30 tablet 8  . famotidine (PEPCID) 20 MG tablet Take 1 tablet (20 mg total) by mouth daily. 30 tablet 1  . ipratropium-albuterol (DUONEB) 0.5-2.5 (3) MG/3ML SOLN USE ONE VIAL VIA NEBULIZER EVERY 6 HOURS AS NEEDED FOR SHORTNESS OF BREATH (Patient taking differently: USE ONE VIAL VIA NEBULIZER THREE TIMES DAILY) 360 mL 5  . isosorbide mononitrate (IMDUR) 60 MG 24 hr tablet Take 1 tablet (60 mg total) by mouth 2 (two) times daily. 180 tablet 0  . latanoprost (XALATAN) 0.005 % ophthalmic solution Place 1 drop into both eyes at bedtime.     Marland Kitchen LORazepam (ATIVAN) 0.5 MG tablet Take 0.5 mg by mouth 2 (two) times daily.    . mometasone-formoterol (DULERA) 100-5 MCG/ACT AERO Inhale 2 puffs into the lungs 2 (two) times daily. Rinse mouth after use    .  montelukast (SINGULAIR) 10 MG tablet Take 1 tablet (10 mg total) by mouth at bedtime. 90 tablet 3  . nitroGLYCERIN (NITROSTAT) 0.4 MG SL tablet Place 1 tablet (0.4 mg total) under the tongue every 5 (five) minutes as needed for chest pain. 25 tablet 0  . pravastatin (PRAVACHOL) 80 MG tablet Take 1 tablet (80 mg total) by mouth every morning. (Patient taking differently: Take 80 mg by mouth daily. ) 30 tablet 2  . PRESCRIPTION MEDICATION Inhale into the lungs at bedtime. CPAP/ 12    . PRESCRIPTION MEDICATION Inject 1 each into the skin once a week. Allergy shots from Dr. Annamaria Boots (1:10) - self-administered - usually Thursday or Friday    . Probiotic Product (PROBIOTIC DAILY) CAPS Take 1 capsule by mouth 2 (two) times daily.     Marland Kitchen RA VITAMIN B-12 TR 1000 MCG TBCR take 1 tablet by mouth once daily 30 tablet 11  . spironolactone (ALDACTONE) 25 MG tablet Take 1 tablet (25 mg total) by mouth daily. 30 tablet 2  . torsemide (DEMADEX) 20 MG tablet Take 1 tablet (20 mg total) by mouth daily. 30 tablet 2  . traZODone (DESYREL) 50 MG tablet Take 0.5-1 tablets (25-50 mg total) by mouth at bedtime as needed for sleep. (Patient taking differently: Take 25 mg by mouth at bedtime as needed for sleep. ) 30 tablet 3  . valsartan (DIOVAN) 320 MG tablet Take 1 tablet (320 mg total) by mouth daily. 90 tablet 3   No current facility-administered medications on file prior to visit.      Review of Systems Constitutional:   No  weight loss, night sweats,  Fevers, chills, + fatigue, or  lassitude.  HEENT:   No headaches,  Difficulty swallowing,  Tooth/dental problems, or  Sore throat,                No sneezing, itching, ear ache, nasal congestion, post nasal drip,   CV:  No chest pain,  Orthopnea, PND, swelling in lower extremities, anasarca, dizziness, palpitations, syncope.   GI  No heartburn, indigestion, abdominal pain, nausea, vomiting, diarrhea, change in bowel habits, loss of appetite, bloody stools.   Resp:   .  No chest wall deformity  Skin: no rash or lesions.  GU: no dysuria, change in color of urine, no urgency or frequency.  No flank pain, no hematuria   MS:  No joint pain or swelling.  No decreased range of motion.  No back pain.  Psych:  No change in mood or affect. No depression or anxiety.  No  memory loss.         Objective:   Physical Exam Filed Vitals:   10/26/15 1543  BP: 134/62  Pulse: 78  Temp: 98 F (36.7 C)  TempSrc: Oral  Height: 5\' 2"  (1.575 m)  Weight: 144 lb (65.318 kg)  SpO2: 98%    GEN: A/Ox3; pleasant , NAD frail and elderly   HEENT:  [place]/AT,  EACs-clear, TMs-wnl, NOSE-clear, THROAT-clear, no lesions, no postnasal drip or exudate noted.   NECK:  Supple w/ fair ROM; no JVD; normal carotid impulses w/o bruits; no thyromegaly or nodules palpated; no lymphadenopathy.  RESP  Decreased BS in bases , .no accessory muscle use, no dullness to percussion  CARD:  RRR, no m/r/g  , no peripheral edema, pulses intact, no cyanosis or clubbing.  GI:   Soft & nt; nml bowel sounds; no organomegaly or masses detected.  Musco: Warm bil, no deformities or joint swelling noted.   Neuro: alert, no focal deficits noted.    Skin: Warm, no lesions or rashes  Dandria Griego NP-C  [facility]  10/26/2015        Assessment & Plan:

## 2015-10-26 NOTE — Patient Instructions (Addendum)
Continue on Dulera 2 puffs Twice daily   Use Duoneb As needed  For wheezing .  Increase Pepcid 20mg  Twice daily   GERD diet.  Continue on CPAP At bedtime  .  follow up Dr. Annamaria Boots  In 2 months and As needed

## 2015-10-27 NOTE — Telephone Encounter (Signed)
Pt can be seen on 01-05-16 at 11:00am for 30 minute visit. Thanks.

## 2015-10-27 NOTE — Telephone Encounter (Signed)
LMOMTCB x 1 

## 2015-10-28 NOTE — Telephone Encounter (Signed)
Called and spoke with pts wife and appt has been scheduled for 9/27 at 10.

## 2015-11-03 ENCOUNTER — Telehealth: Payer: Self-pay | Admitting: Emergency Medicine

## 2015-11-03 ENCOUNTER — Observation Stay (HOSPITAL_COMMUNITY)
Admission: EM | Admit: 2015-11-03 | Discharge: 2015-11-07 | Disposition: A | Discharge status: Home / Self Care | Payer: Medicare Other | Attending: Internal Medicine | Admitting: Internal Medicine

## 2015-11-03 ENCOUNTER — Ambulatory Visit (INDEPENDENT_AMBULATORY_CARE_PROVIDER_SITE_OTHER): Payer: Medicare Other | Admitting: Internal Medicine

## 2015-11-03 ENCOUNTER — Encounter (HOSPITAL_COMMUNITY): Payer: Self-pay | Admitting: Emergency Medicine

## 2015-11-03 ENCOUNTER — Other Ambulatory Visit (INDEPENDENT_AMBULATORY_CARE_PROVIDER_SITE_OTHER): Payer: Medicare Other

## 2015-11-03 ENCOUNTER — Other Ambulatory Visit: Payer: Self-pay | Admitting: *Deleted

## 2015-11-03 ENCOUNTER — Encounter: Payer: Self-pay | Admitting: Internal Medicine

## 2015-11-03 VITALS — BP 160/66 | HR 70 | Temp 98.0°F | Resp 18 | Wt 148.0 lb

## 2015-11-03 DIAGNOSIS — E871 Hypo-osmolality and hyponatremia: Secondary | ICD-10-CM | POA: Diagnosis not present

## 2015-11-03 DIAGNOSIS — Z951 Presence of aortocoronary bypass graft: Secondary | ICD-10-CM | POA: Insufficient documentation

## 2015-11-03 DIAGNOSIS — R197 Diarrhea, unspecified: Secondary | ICD-10-CM | POA: Insufficient documentation

## 2015-11-03 DIAGNOSIS — E785 Hyperlipidemia, unspecified: Secondary | ICD-10-CM | POA: Diagnosis not present

## 2015-11-03 DIAGNOSIS — G4733 Obstructive sleep apnea (adult) (pediatric): Secondary | ICD-10-CM | POA: Diagnosis not present

## 2015-11-03 DIAGNOSIS — F411 Generalized anxiety disorder: Secondary | ICD-10-CM | POA: Insufficient documentation

## 2015-11-03 DIAGNOSIS — M810 Age-related osteoporosis without current pathological fracture: Secondary | ICD-10-CM | POA: Insufficient documentation

## 2015-11-03 DIAGNOSIS — J449 Chronic obstructive pulmonary disease, unspecified: Secondary | ICD-10-CM | POA: Diagnosis not present

## 2015-11-03 DIAGNOSIS — N390 Urinary tract infection, site not specified: Secondary | ICD-10-CM | POA: Insufficient documentation

## 2015-11-03 DIAGNOSIS — Z7902 Long term (current) use of antithrombotics/antiplatelets: Secondary | ICD-10-CM | POA: Diagnosis not present

## 2015-11-03 DIAGNOSIS — F418 Other specified anxiety disorders: Secondary | ICD-10-CM | POA: Insufficient documentation

## 2015-11-03 DIAGNOSIS — K5730 Diverticulosis of large intestine without perforation or abscess without bleeding: Secondary | ICD-10-CM | POA: Insufficient documentation

## 2015-11-03 DIAGNOSIS — Z955 Presence of coronary angioplasty implant and graft: Secondary | ICD-10-CM | POA: Insufficient documentation

## 2015-11-03 DIAGNOSIS — R05 Cough: Secondary | ICD-10-CM

## 2015-11-03 DIAGNOSIS — I480 Paroxysmal atrial fibrillation: Secondary | ICD-10-CM | POA: Diagnosis not present

## 2015-11-03 DIAGNOSIS — R059 Cough, unspecified: Secondary | ICD-10-CM

## 2015-11-03 DIAGNOSIS — Z87891 Personal history of nicotine dependence: Secondary | ICD-10-CM | POA: Insufficient documentation

## 2015-11-03 DIAGNOSIS — I5032 Chronic diastolic (congestive) heart failure: Secondary | ICD-10-CM | POA: Diagnosis present

## 2015-11-03 DIAGNOSIS — K219 Gastro-esophageal reflux disease without esophagitis: Secondary | ICD-10-CM | POA: Diagnosis not present

## 2015-11-03 DIAGNOSIS — I2510 Atherosclerotic heart disease of native coronary artery without angina pectoris: Secondary | ICD-10-CM | POA: Insufficient documentation

## 2015-11-03 DIAGNOSIS — Z7901 Long term (current) use of anticoagulants: Secondary | ICD-10-CM | POA: Insufficient documentation

## 2015-11-03 DIAGNOSIS — N182 Chronic kidney disease, stage 2 (mild): Secondary | ICD-10-CM | POA: Insufficient documentation

## 2015-11-03 DIAGNOSIS — F1021 Alcohol dependence, in remission: Secondary | ICD-10-CM | POA: Diagnosis not present

## 2015-11-03 DIAGNOSIS — I10 Essential (primary) hypertension: Secondary | ICD-10-CM | POA: Diagnosis present

## 2015-11-03 DIAGNOSIS — I252 Old myocardial infarction: Secondary | ICD-10-CM | POA: Diagnosis not present

## 2015-11-03 DIAGNOSIS — J45909 Unspecified asthma, uncomplicated: Secondary | ICD-10-CM | POA: Diagnosis not present

## 2015-11-03 DIAGNOSIS — I130 Hypertensive heart and chronic kidney disease with heart failure and stage 1 through stage 4 chronic kidney disease, or unspecified chronic kidney disease: Secondary | ICD-10-CM | POA: Insufficient documentation

## 2015-11-03 DIAGNOSIS — F329 Major depressive disorder, single episode, unspecified: Secondary | ICD-10-CM | POA: Diagnosis present

## 2015-11-03 DIAGNOSIS — Z79899 Other long term (current) drug therapy: Secondary | ICD-10-CM | POA: Insufficient documentation

## 2015-11-03 DIAGNOSIS — Z7951 Long term (current) use of inhaled steroids: Secondary | ICD-10-CM | POA: Insufficient documentation

## 2015-11-03 DIAGNOSIS — H409 Unspecified glaucoma: Secondary | ICD-10-CM | POA: Insufficient documentation

## 2015-11-03 DIAGNOSIS — D509 Iron deficiency anemia, unspecified: Secondary | ICD-10-CM | POA: Diagnosis present

## 2015-11-03 DIAGNOSIS — F32A Depression, unspecified: Secondary | ICD-10-CM | POA: Diagnosis present

## 2015-11-03 DIAGNOSIS — I739 Peripheral vascular disease, unspecified: Secondary | ICD-10-CM | POA: Diagnosis present

## 2015-11-03 DIAGNOSIS — K529 Noninfective gastroenteritis and colitis, unspecified: Secondary | ICD-10-CM

## 2015-11-03 LAB — COMPREHENSIVE METABOLIC PANEL
ALK PHOS: 53 U/L (ref 38–126)
ALT: 14 U/L (ref 0–53)
ALT: 17 U/L (ref 17–63)
ANION GAP: 9 (ref 5–15)
AST: 15 U/L (ref 0–37)
AST: 18 U/L (ref 15–41)
Albumin: 3.7 g/dL (ref 3.5–5.2)
Albumin: 3.2 g/dL — ABNORMAL LOW (ref 3.5–5.0)
Alkaline Phosphatase: 49 U/L (ref 39–117)
BILIRUBIN TOTAL: 0.5 mg/dL (ref 0.2–1.2)
BILIRUBIN TOTAL: 0.5 mg/dL (ref 0.3–1.2)
BUN: 17 mg/dL (ref 6–23)
BUN: 18 mg/dL (ref 6–20)
CALCIUM: 9 mg/dL (ref 8.4–10.5)
CALCIUM: 9 mg/dL (ref 8.9–10.3)
CHLORIDE: 85 meq/L — AB (ref 96–112)
CO2: 29 meq/L (ref 19–32)
CO2: 24 mmol/L (ref 22–32)
CREATININE: 1.06 mg/dL (ref 0.40–1.50)
Chloride: 87 mmol/L — ABNORMAL LOW (ref 101–111)
Creatinine, Ser: 1 mg/dL (ref 0.61–1.24)
GFR calc non Af Amer: >60 mL/min (ref >60)
GFR: 70.97 mL/min (ref >60.00)
GLUCOSE: 101 mg/dL — AB (ref 70–99)
Glucose, Bld: 102 mg/dL — ABNORMAL HIGH (ref 65–99)
POTASSIUM: 4.5 mmol/L (ref 3.5–5.1)
Potassium: 4.5 mEq/L (ref 3.5–5.1)
SODIUM: 119 meq/L — AB (ref 135–145)
SODIUM: 120 mmol/L — AB (ref 135–145)
TOTAL PROTEIN: 6.3 g/dL — AB (ref 6.5–8.1)
Total Protein: 6.6 g/dL (ref 6.0–8.3)

## 2015-11-03 LAB — CBC WITH DIFFERENTIAL/PLATELET
BASOS ABS: 0.1 10*3/uL (ref 0.0–0.1)
BASOS PCT: 0.5 % (ref 0.0–3.0)
Basophils Absolute: 0 10*3/uL (ref 0.0–0.1)
Basophils Relative: 0 %
EOS ABS: 0 10*3/uL (ref 0.0–0.7)
EOS ABS: 0.1 10*3/uL (ref 0.0–0.7)
Eosinophils Relative: 0.3 % (ref 0.0–5.0)
Eosinophils Relative: 0 %
HCT: 33.8 % — ABNORMAL LOW (ref 39.0–52.0)
HCT: 32.2 % — ABNORMAL LOW (ref 39.0–52.0)
HEMOGLOBIN: 11.3 g/dL — AB (ref 13.0–17.0)
Hemoglobin: 11.6 g/dL — ABNORMAL LOW (ref 13.0–17.0)
LYMPHS ABS: 1.6 10*3/uL (ref 0.7–4.0)
LYMPHS ABS: 1.9 10*3/uL (ref 0.7–4.0)
Lymphocytes Relative: 14 % (ref 12.0–46.0)
Lymphocytes Relative: 16 %
MCH: 30.2 pg (ref 26.0–34.0)
MCHC: 34.4 g/dL (ref 30.0–36.0)
MCHC: 35.1 g/dL (ref 30.0–36.0)
MCV: 88 fl (ref 78.0–100.0)
MCV: 86.1 fL (ref 78.0–100.0)
MONO ABS: 1 10*3/uL (ref 0.1–1.0)
MONO ABS: 1.2 10*3/uL — AB (ref 0.1–1.0)
MONOS PCT: 10 %
Monocytes Relative: 8.6 % (ref 3.0–12.0)
NEUTROS ABS: 8.7 10*3/uL — AB (ref 1.4–7.7)
NEUTROS PCT: 76.6 % (ref 43.0–77.0)
NEUTROS PCT: 74 %
Neutro Abs: 8.6 10*3/uL — ABNORMAL HIGH (ref 1.7–7.7)
PLATELETS: 259 10*3/uL (ref 150.0–400.0)
Platelets: 220 10*3/uL (ref 150–400)
RBC: 3.85 Mil/uL — ABNORMAL LOW (ref 4.22–5.81)
RBC: 3.74 MIL/uL — ABNORMAL LOW (ref 4.22–5.81)
RDW: 16.9 % — AB (ref 11.5–15.5)
RDW: 15.8 % — AB (ref 11.5–15.5)
WBC: 11.4 10*3/uL — ABNORMAL HIGH (ref 4.0–10.5)
WBC: 11.7 10*3/uL — ABNORMAL HIGH (ref 4.0–10.5)

## 2015-11-03 LAB — OSMOLALITY: OSMOLALITY: 258 mosm/kg — AB (ref 275–295)

## 2015-11-03 LAB — URINE MICROSCOPIC-ADD ON
RBC / HPF: NONE SEEN RBC/hpf (ref 0–5)
SQUAMOUS EPITHELIAL / LPF: NONE SEEN

## 2015-11-03 LAB — SODIUM, URINE, RANDOM: SODIUM UR: 33 mmol/L

## 2015-11-03 LAB — URINALYSIS, ROUTINE W REFLEX MICROSCOPIC
Bilirubin Urine: NEGATIVE
Glucose, UA: NEGATIVE mg/dL
KETONES UR: NEGATIVE mg/dL
NITRITE: POSITIVE — AB
PROTEIN: NEGATIVE mg/dL
Specific Gravity, Urine: <1.005 — ABNORMAL LOW (ref 1.005–1.030)
pH: 7 (ref 5.0–8.0)

## 2015-11-03 LAB — BRAIN NATRIURETIC PEPTIDE: B Natriuretic Peptide: 245.7 pg/mL — ABNORMAL HIGH (ref 0.0–100.0)

## 2015-11-03 LAB — CREATININE, URINE, RANDOM: Creatinine, Urine: 83.44 mg/dL

## 2015-11-03 MED ORDER — ONDANSETRON HCL 4 MG/2ML IJ SOLN: 4.0000 mg | Freq: Four times a day (QID) | INTRAMUSCULAR | Status: DC | PRN

## 2015-11-03 MED ORDER — NITROGLYCERIN 0.4 MG SL SUBL: 0.4000 mg | SUBLINGUAL_TABLET | SUBLINGUAL | Status: DC | PRN

## 2015-11-03 MED ORDER — CLOPIDOGREL BISULFATE 75 MG PO TABS
75.0000 mg | ORAL_TABLET | Freq: Every day | ORAL | Status: DC
  Administered 2015-11-04 – 2015-11-07 (×4): 75 mg via ORAL
  Filled 2015-11-03 (×4): qty 1

## 2015-11-03 MED ORDER — BISOPROLOL FUMARATE 5 MG PO TABS
20.0000 mg | ORAL_TABLET | Freq: Every day | ORAL | Status: DC
  Administered 2015-11-04 – 2015-11-07 (×4): 20 mg via ORAL
  Filled 2015-11-03 (×2): qty 2
  Filled 2015-11-03 (×2): qty 4

## 2015-11-03 MED ORDER — DEXTROSE 5 % IV SOLN
1.0000 g | Freq: Every day | INTRAVENOUS | Status: DC
  Administered 2015-11-04 – 2015-11-06 (×4): 1 g via INTRAVENOUS
  Filled 2015-11-03 (×5): qty 10

## 2015-11-03 MED ORDER — SODIUM CHLORIDE 0.9 % IV BOLUS (SEPSIS)
1000.0000 mL | Freq: Once | INTRAVENOUS | Status: AC
Start: 2015-11-03 — End: 2015-11-03
  Administered 2015-11-03: 1000 mL via INTRAVENOUS

## 2015-11-03 MED ORDER — PRAVASTATIN SODIUM 40 MG PO TABS
80.0000 mg | ORAL_TABLET | Freq: Every day | ORAL | Status: DC
  Administered 2015-11-04 – 2015-11-06 (×3): 80 mg via ORAL
  Filled 2015-11-03 (×4): qty 2

## 2015-11-03 MED ORDER — HYDRALAZINE HCL 20 MG/ML IJ SOLN: 5.0000 mg | INTRAMUSCULAR | Status: DC | PRN

## 2015-11-03 MED ORDER — SODIUM CHLORIDE 0.9 % IV SOLN
INTRAVENOUS | Status: DC
  Administered 2015-11-04 – 2015-11-07 (×4): via INTRAVENOUS

## 2015-11-03 MED ORDER — ONDANSETRON HCL 4 MG PO TABS: 4.0000 mg | ORAL_TABLET | Freq: Four times a day (QID) | ORAL | Status: DC | PRN

## 2015-11-03 MED ORDER — ALBUTEROL SULFATE HFA 108 (90 BASE) MCG/ACT IN AERS: 2.0000 | INHALATION_SPRAY | Freq: Four times a day (QID) | RESPIRATORY_TRACT | Status: DC | PRN

## 2015-11-03 MED ORDER — BISOPROLOL FUMARATE 10 MG PO TABS: 20.0000 mg | ORAL_TABLET | Freq: Every day | ORAL | 5 refills | Status: DC

## 2015-11-03 MED ORDER — DEXLANSOPRAZOLE 60 MG PO CPDR: 60.0000 mg | DELAYED_RELEASE_CAPSULE | Freq: Two times a day (BID) | ORAL | 3 refills | Status: DC

## 2015-11-03 MED ORDER — ALBUTEROL SULFATE (2.5 MG/3ML) 0.083% IN NEBU: 2.5000 mg | INHALATION_SOLUTION | RESPIRATORY_TRACT | Status: DC | PRN

## 2015-11-03 MED ORDER — CHOLESTYRAMINE 4 G PO PACK
8.0000 g | PACK | Freq: Every day | ORAL | Status: DC
  Administered 2015-11-04 – 2015-11-06 (×2): 8 g via ORAL
  Filled 2015-11-03 (×4): qty 2

## 2015-11-03 MED ORDER — MOMETASONE FURO-FORMOTEROL FUM 100-5 MCG/ACT IN AERO
2.0000 | INHALATION_SPRAY | Freq: Two times a day (BID) | RESPIRATORY_TRACT | Status: DC
  Administered 2015-11-04 – 2015-11-07 (×7): 2 via RESPIRATORY_TRACT
  Filled 2015-11-03: qty 8.8

## 2015-11-03 MED ORDER — CALCIUM CARBONATE-VITAMIN D 500-200 MG-UNIT PO TABS
1.0000 | ORAL_TABLET | Freq: Two times a day (BID) | ORAL | Status: DC
  Administered 2015-11-04 – 2015-11-07 (×7): 1 via ORAL
  Filled 2015-11-03 (×7): qty 1

## 2015-11-03 MED ORDER — TRAZODONE HCL 50 MG PO TABS: 50.0000 mg | ORAL_TABLET | Freq: Every evening | ORAL | Status: DC | PRN

## 2015-11-03 MED ORDER — APIXABAN 5 MG PO TABS
5.0000 mg | ORAL_TABLET | Freq: Two times a day (BID) | ORAL | Status: DC
  Administered 2015-11-03 – 2015-11-07 (×8): 5 mg via ORAL
  Filled 2015-11-03 (×8): qty 1

## 2015-11-03 MED ORDER — SODIUM CHLORIDE 0.9% FLUSH
3.0000 mL | Freq: Two times a day (BID) | INTRAVENOUS | Status: DC
  Administered 2015-11-04 – 2015-11-06 (×2): 3 mL via INTRAVENOUS

## 2015-11-03 MED ORDER — TRAZODONE HCL 50 MG PO TABS: 50.0000 mg | ORAL_TABLET | Freq: Every evening | ORAL | 5 refills | Status: DC | PRN

## 2015-11-03 MED ORDER — MONTELUKAST SODIUM 10 MG PO TABS
10.0000 mg | ORAL_TABLET | Freq: Every day | ORAL | Status: DC
  Administered 2015-11-04 – 2015-11-06 (×4): 10 mg via ORAL
  Filled 2015-11-03 (×4): qty 1

## 2015-11-03 MED ORDER — LORAZEPAM 0.5 MG PO TABS
0.5000 mg | ORAL_TABLET | Freq: Two times a day (BID) | ORAL | Status: DC
  Administered 2015-11-04 – 2015-11-07 (×8): 0.5 mg via ORAL
  Filled 2015-11-03 (×8): qty 1

## 2015-11-03 MED ORDER — DM-GUAIFENESIN ER 30-600 MG PO TB12: 1.0000 | ORAL_TABLET | Freq: Two times a day (BID) | ORAL | Status: DC | PRN

## 2015-11-03 MED ORDER — ACETAMINOPHEN 325 MG PO TABS: 650.0000 mg | ORAL_TABLET | Freq: Four times a day (QID) | ORAL | Status: DC | PRN

## 2015-11-03 MED ORDER — IRBESARTAN 300 MG PO TABS
300.0000 mg | ORAL_TABLET | Freq: Every day | ORAL | Status: DC
  Administered 2015-11-04 – 2015-11-07 (×4): 300 mg via ORAL
  Filled 2015-11-03 (×4): qty 1

## 2015-11-03 MED ORDER — PROBIOTIC DAILY PO CAPS: 1.0000 | ORAL_CAPSULE | Freq: Two times a day (BID) | ORAL | Status: DC

## 2015-11-03 MED ORDER — ISOSORBIDE MONONITRATE ER 60 MG PO TB24
60.0000 mg | ORAL_TABLET | Freq: Two times a day (BID) | ORAL | Status: DC
  Administered 2015-11-03 – 2015-11-07 (×8): 60 mg via ORAL
  Filled 2015-11-03 (×8): qty 1

## 2015-11-03 MED ORDER — CYANOCOBALAMIN ER 1000 MCG PO TBCR: 1.0000 | EXTENDED_RELEASE_TABLET | Freq: Every day | ORAL | Status: DC

## 2015-11-03 MED ORDER — LATANOPROST 0.005 % OP SOLN
1.0000 [drp] | Freq: Every day | OPHTHALMIC | Status: DC
Start: 2015-11-04 — End: 2015-11-07
  Administered 2015-11-04 – 2015-11-06 (×4): 1 [drp] via OPHTHALMIC
  Filled 2015-11-03: qty 2.5

## 2015-11-03 MED ORDER — FAMOTIDINE 20 MG PO TABS
20.0000 mg | ORAL_TABLET | Freq: Two times a day (BID) | ORAL | Status: DC
  Administered 2015-11-04 – 2015-11-07 (×8): 20 mg via ORAL
  Filled 2015-11-03 (×8): qty 1

## 2015-11-03 NOTE — Telephone Encounter (Signed)
Called all numbers listed, LVM informing pt, MD would like him to go to the ED for sodium levels

## 2015-11-03 NOTE — H&P (Signed)
History and Physical    [name]. RK:[id number] DOB: 29-May-1930 DOA: 11/03/2015  Referring MD/NP/PA:   PCP: Stephen Rail, MD   Patient coming from:  The patient is coming from home.  At baseline, pt is independent for most of ADL.   Chief Complaint: Dysuria, diarrhea, hyponatremia  HPI: [name]. is a 82 y.o. male with medical history significant of hypertension, hyperlipidemia, COPD, asthma, CAD, s/p of stent, OSA on CPAP, PAF on Eliquis, CKD-II, dCHF, PVD, who presents with dysuria, diarrhea, hyponatremia.  Patient reports that he has been having diarrhea for almost 3 weeks. He has 5-6 watery bowel movements each day. He has mild dizziness. No nausea, vomiting or abdominal pain. No fever or chills. He also has some mild dysuria, no burning on urination. Patient has mild cough due to asthma and COPD, which is at baseline. No chest pain or SOB. Patient does not have unilateral weakness, hematuria or rash. Pt was seen by his PCP and was found to have "very low sodium" and was seen here for further evaluation and treatment.  ED Course: pt was found to have sodium 120, positive urinalysis with moderate amount of leukocytes and positive nitrates, WBC 7.7, temperature normal, no tachycardia, no tachypnea, potassium and creatinine normal. Patient is placed on telemetry bed for observation.  Review of Systems:   General: no fevers, chills, no changes in body weight, has fatigue HEENT: no blurry vision, hearing changes or sore throat Pulm: no dyspnea, has coughing, no wheezing CV: no chest pain, no palpitations Abd: no nausea, vomiting, abdominal pain, has diarrhea, no constipation GU: has dysuria, no burning on urination, increased urinary frequency, hematuria  Ext: no leg edema Neuro: no unilateral weakness, numbness, or tingling, no vision change or hearing loss Skin: no rash MSK: No muscle spasm, no deformity, no limitation of range of movement in spin Heme: No easy  bruising.  Travel history: No recent long distant travel.  Allergy:  Allergies  Allergen Reactions  . Ace Inhibitors Other (See Comments)    Severe asthma (COPD)  . Avelox [Moxifloxacin Hcl In Nacl] Other (See Comments)    Gum pain  . Tape Other (See Comments)    Adhesive and tape reaction-tears skin off   . Lidocaine Other (See Comments)    Possible reaction? Dizziness, confusion, syncope  . Morphine And Related Nausea And Vomiting  . Codeine Nausea And Vomiting  . Lasix [Furosemide] Itching and Swelling  . Other Other (See Comments)    Maple trees, allergy symptoms    Past Medical History:  Diagnosis Date  . Anxiety   . Arthritis   . ASTHMA   . Benign liver cyst   . CAD (coronary artery disease)    a. s/p CABG;  b. cath 06/27/10: S-Dx occluded, S-PDA 80-90% (tx with PCI); S-OM ok, L-LAD ok;  EF 65-70%  c.  s/p Promus DES to S-PDA 06/2010;   d. Cath 8 2016 LIMA to the LAD patent, SVG to posterior lateral subtotal, SVG to diagonal occluded chronically. The patient had stenting of the native right vessel.  . Cataract    surgery to both eyes  . Chronic diastolic CHF (congestive heart failure), NYHA class 2 ([place])   . CKD (chronic kidney disease), stage II   . COPD (chronic obstructive pulmonary disease) ([place])   . Depression   . Diverticulosis   . DYSLIPIDEMIA   . Esophageal stricture   . Essential hypertension    Echo 3/12: EF 55-60%;  mod LVH; mild AS/AI; LAE; PASP 38; mild pulmo HTN  . GERD    with HH, hx esophageal stricture  . Glaucoma   . Hiatal hernia   . Osteoporosis   . PAF (paroxysmal atrial fibrillation) ([place])   . Personal history of alcoholism ([place])   . Pneumonia 04/2013 hosp  . Skin cancer    L forearm  . SLEEP APNEA 09/2001   NPSG AHI 22/HR  . Subclavian arterial stenosis ([facility])    a. s/p PTCA of subclavian 2016.    Past Surgical History:  Procedure Laterality Date  . CARDIAC CATHETERIZATION N/A 11/26/2014   Procedure: Left Heart Cath and Cors/Grafts  Angiography;  Surgeon: Sherren Mocha, MD;  Location: [facility];  Service: Cardiovascular;  Laterality: N/A;  . CARDIAC CATHETERIZATION N/A 11/26/2014   Procedure: Coronary Stent Intervention;  Surgeon: Sherren Mocha, MD;  Location: [facility];  Service: Cardiovascular;  Laterality: N/A;  . CATARACT EXTRACTION, BILATERAL  2007  . CHOLECYSTECTOMY  02/21/2011   Procedure: LAPAROSCOPIC CHOLECYSTECTOMY WITH INTRAOPERATIVE CHOLANGIOGRAM;  Surgeon: Pedro Earls, MD;  Location: [facility];  Service: General;  Laterality: N/A;  . CORONARY ANGIOPLASTY WITH STENT PLACEMENT  07/2010  . CORONARY ARTERY BYPASS GRAFT  02/2007  . HERNIA REPAIR  unsure ?60's  . PERIPHERAL VASCULAR CATHETERIZATION N/A 02/22/2015   Procedure: Upper Extremity Angiography;  Surgeon: Lorretta Harp, MD;  Location: [facility];  Service: Cardiovascular;  Laterality: N/A;  . TONSILLECTOMY      Social History:  reports that he quit smoking about 36 years ago. His smoking use included Cigarettes. He has a 120.00 pack-year smoking history. He has never used smokeless tobacco. He reports that he does not drink alcohol or use drugs.  Family History:  Family History  Problem Relation Age of Onset  . COPD Sister   . Asthma Sister   . Emphysema Sister   . Hypertension Sister   . Hyperlipidemia Sister   . Hypertension Mother   . Heart disease Mother   . Hyperlipidemia Mother   . Heart disease Father   . Hyperlipidemia Father   . Hypertension Father   . Heart disease Brother   . Colon cancer Neg Hx      Prior to Admission medications   Medication Sig Start Date End Date Taking? Authorizing Provider  acetaminophen (TYLENOL) 325 MG tablet Take 650 mg by mouth every 6 (six) hours as needed for moderate pain or headache.     Historical Provider, MD  albuterol (PROAIR HFA) 108 (90 Base) MCG/ACT inhaler Inhale 2 puffs into the lungs every 6 (six) hours as needed for wheezing or shortness of breath. 08/16/15    Deneise Lever, MD  apixaban (ELIQUIS) 5 MG TABS tablet Take 1 tablet (5 mg total) by mouth 2 (two) times daily. 05/20/15   Minus Breeding, MD  bisoprolol (ZEBETA) 10 MG tablet Take 2 tablets (20 mg total) by mouth daily. 11/03/15   Stephen Rail, MD  calcium-vitamin D (OSCAL WITH D) 500-200 MG-UNIT per tablet Take 1 tablet by mouth 2 (two) times daily.    Historical Provider, MD  cholestyramine (QUESTRAN) 4 g packet MIX AND DRINK 1 PACKET BY MOUTH TWICE A DAY IF NEEDED FOR LOOSE STOOLS Patient taking differently: Take 8 g by mouth daily after lunch. 2 packets 07/13/15   Irene Shipper, MD  clopidogrel (PLAVIX) 75 MG tablet Take 1 tablet (75 mg total) by mouth daily with breakfast. 09/27/15   Jeneen Rinks  Hochrein, MD  dexlansoprazole (DEXILANT) 60 MG capsule Take 1 capsule (60 mg total) by mouth 2 (two) times daily before a meal. 11/03/15   Stephen Rail, MD  famotidine (PEPCID) 20 MG tablet Take 1 tablet (20 mg total) by mouth daily. 10/21/15   Dayna N Dunn, PA-C  ipratropium-albuterol (DUONEB) 0.5-2.5 (3) MG/3ML SOLN USE ONE VIAL VIA NEBULIZER EVERY 6 HOURS AS NEEDED FOR SHORTNESS OF BREATH Patient taking differently: USE ONE VIAL VIA NEBULIZER THREE TIMES DAILY 12/17/14   Deneise Lever, MD  isosorbide mononitrate (IMDUR) 60 MG 24 hr tablet Take 1 tablet (60 mg total) by mouth 2 (two) times daily. 05/19/15   Shanker Kristeen Mans, MD  latanoprost (XALATAN) 0.005 % ophthalmic solution Place 1 drop into both eyes at bedtime.     Historical Provider, MD  LORazepam (ATIVAN) 0.5 MG tablet Take 0.5 mg by mouth 2 (two) times daily.    Historical Provider, MD  mometasone-formoterol (DULERA) 100-5 MCG/ACT AERO Inhale 2 puffs into the lungs 2 (two) times daily. Rinse mouth after use    Historical Provider, MD  montelukast (SINGULAIR) 10 MG tablet Take 1 tablet (10 mg total) by mouth at bedtime. 11/11/14   Rowe Clack, MD  nitroGLYCERIN (NITROSTAT) 0.4 MG SL tablet Place 1 tablet (0.4 mg total) under the tongue every 5  (five) minutes as needed for chest pain. 07/13/14   Minus Breeding, MD  pravastatin (PRAVACHOL) 80 MG tablet Take 1 tablet (80 mg total) by mouth every morning. Patient taking differently: Take 80 mg by mouth daily.  07/29/15   Minus Breeding, MD  PRESCRIPTION MEDICATION Inhale into the lungs at bedtime. CPAP/ 12    Historical Provider, MD  PRESCRIPTION MEDICATION Inject 1 each into the skin once a week. Allergy shots from Dr. Annamaria Boots (1:10) - self-administered - usually Thursday or Friday    Historical Provider, MD  Probiotic Product (PROBIOTIC DAILY) CAPS Take 1 capsule by mouth 2 (two) times daily.     Historical Provider, MD  RA VITAMIN B-12 TR 1000 MCG TBCR take 1 tablet by mouth once daily 01/23/14   Rowe Clack, MD  spironolactone (ALDACTONE) 25 MG tablet Take 1 tablet (25 mg total) by mouth daily. 10/21/15   Dayna N Dunn, PA-C  torsemide (DEMADEX) 20 MG tablet Take 1 tablet (20 mg total) by mouth daily. 10/21/15   Dayna N Dunn, PA-C  traZODone (DESYREL) 50 MG tablet Take 1 tablet (50 mg total) by mouth at bedtime as needed for sleep. 11/03/15   Stephen Rail, MD  valsartan (DIOVAN) 320 MG tablet Take 1 tablet (320 mg total) by mouth daily. 08/10/15   Stephen Rail, MD    Physical Exam: Vitals:   11/03/15 1912 11/03/15 2115 11/03/15 2130  BP: 165/88 161/73 155/70  Pulse: 82 77 81  Resp: 19 19 15   Temp: 98.8 F (37.1 C)    TempSrc: Oral    SpO2: 98% 99% 100%  Weight: 67.6 kg (149 lb)    Height: 5\' 2"  (1.575 m)     General: Not in acute distress HEENT:       Eyes: PERRL, EOMI, no scleral icterus.       ENT: No discharge from the ears and nose, no pharynx injection, no tonsillar enlargement.        Neck: No JVD, no bruit, no mass felt. Heme: No neck lymph node enlargement. Cardiac: S1/S2, RRR, 2/6 murmurs, No gallops or rubs. Pulm: No rales, wheezing, rhonchi or rubs.  Abd: Soft, nondistended, nontender, no rebound pain, no organomegaly, BS present. GU: No hematuria Ext: No  pitting leg edema bilaterally. 2+DP/PT pulse bilaterally. Musculoskeletal: No joint deformities, No joint redness or warmth, no limitation of ROM in spin. Skin: No rashes.  Neuro: Alert, oriented X3, cranial nerves II-XII grossly intact, moves all extremities normally.  Psych: Patient is not psychotic, no suicidal or hemocidal ideation.  Labs on Admission: I have personally reviewed following labs and imaging studies  CBC:  Recent Labs Lab 11/03/15 1420 11/03/15 1926  WBC 11.4* 11.7*  NEUTROABS 8.7* 8.6*  HGB 11.6* 11.3*  HCT 33.8* 32.2*  MCV 88.0 86.1  PLT 259.0 [id number]   Basic Metabolic Panel:  Recent Labs Lab 11/03/15 1420 11/03/15 1926  NA 119* 120*  K 4.5 4.5  CL 85* 87*  CO2 29 24  GLUCOSE 101* 102*  BUN 17 18  CREATININE 1.06 1.00  CALCIUM 9.0 9.0   GFR: Estimated Creatinine Clearance: 48.2 mL/min (by C-G formula based on SCr of 1 mg/dL). Liver Function Tests:  Recent Labs Lab 11/03/15 1420 11/03/15 1926  AST 15 18  ALT 14 17  ALKPHOS 49 53  BILITOT 0.5 0.5  PROT 6.6 6.3*  ALBUMIN 3.7 3.2*   No results for input(s): LIPASE, AMYLASE in the last 168 hours. No results for input(s): AMMONIA in the last 168 hours. Coagulation Profile: No results for input(s): INR, PROTIME in the last 168 hours. Cardiac Enzymes: No results for input(s): CKTOTAL, CKMB, CKMBINDEX, TROPONINI in the last 168 hours. BNP (last 3 results) No results for input(s): PROBNP in the last 8760 hours. HbA1C: No results for input(s): HGBA1C in the last 72 hours. CBG: No results for input(s): GLUCAP in the last 168 hours. Lipid Profile: No results for input(s): CHOL, HDL, LDLCALC, TRIG, CHOLHDL, LDLDIRECT in the last 72 hours. Thyroid Function Tests: No results for input(s): TSH, T4TOTAL, FREET4, T3FREE, THYROIDAB in the last 72 hours. Anemia Panel: No results for input(s): VITAMINB12, FOLATE, FERRITIN, TIBC, IRON, RETICCTPCT in the last 72 hours. Urine analysis:    Component Value  Date/Time   COLORURINE YELLOW 11/03/2015 1934   APPEARANCEUR CLEAR 11/03/2015 1934   LABSPEC <1.005 (L) 11/03/2015 1934   PHURINE 7.0 11/03/2015 1934   GLUCOSEU NEGATIVE 11/03/2015 1934   GLUCOSEU NEGATIVE 06/19/2014 0941   HGBUR TRACE (A) 11/03/2015 1934   BILIRUBINUR NEGATIVE 11/03/2015 1934   BILIRUBINUR negative 09/20/2015 1946   KETONESUR NEGATIVE 11/03/2015 1934   PROTEINUR NEGATIVE 11/03/2015 1934   UROBILINOGEN 0.2 09/20/2015 1946   UROBILINOGEN 0.2 09/17/2014 2215   NITRITE POSITIVE (A) 11/03/2015 1934   LEUKOCYTESUR MODERATE (A) 11/03/2015 1934   Sepsis Labs: @LABRCNTIP (procalcitonin:4,lacticidven:4) )No results found for this or any previous visit (from the past 240 hour(s)).   Radiological Exams on Admission: No results found.   EKG: Not done in ED, will get one.   Assessment/Plan Principal Problem:   Hyponatremia Active Problems:   Hyperlipidemia with target LDL less than 100   Coronary atherosclerosis   Asthma with COPD ([place])   GERD   Chronic diastolic heart failure ([facility])   PAF (paroxysmal atrial fibrillation) ([facility])   Anemia, iron deficiency   PVD (peripheral vascular disease) ([place])   Essential hypertension   Diarrhea   UTI (lower urinary tract infection)    Hyponatremia: Na=120 on admission. This is most likely due to diarrhea and continuation of diuretics. Mental status normal.   -will place on tele bed for obs - check urinalysis, urine sodium, urine osmolality, serum  osmolality. - will not hold his ARB since we have reasonable explanation for his hyponatremia - IVF: 1L NS was given in ED, will continue with IV normal saline at 75 mL/h - BMP q6h  Diarrhea: pt has 5 to 6 watery bowel movements each day. Etiology is not clear. Patient is not taking laxatives except for cholestyramine, but he has been taking this med chronically.  -f/u C diff pcr (ordered by EDP) -IVF as above  UTI: pt has dysuria and a positive urinalysis, consistent with UTI.  Patient is not septic on admission. Hemodynamically stable. -IV rocephin -f/u Bx and Ux  PAF: CHA2DS2-VASc Score is 5, needs oral anticoagulation. Patient is on Eliquis at home. Heart rate is well controlled. -continue Eliquis and Zebeta  HLD: Last LDL was  72 on 2/10 and 2/17  -Continue home medications:  pravastatin  Coronary atherosclerosis: s/p of stent 2012. No CP -continue Plavix, Zebeta, pravastatin, Imdur, when necessary nitroglycerin  Asthma with COPD ([place]): stable -prn albuterol nebulizer -Singulair, Dulera inhaler -prn Mucinex for cough  GERD: - hold Protonix until C diff pcr negative -change Pepcid for daily to 20 mg bid  Chronic diastolic heart failure ([place]): 2-D echo on 09/24/15 showed EF 60-65 percent. Patient does not have leg edema or JVD. CHF is compensated. -Hold torsemide and spironolactone due to hyponatremia -Check BNP  Essential hypertension: bp 155/70 -Irbesartan and zebeta -IV hydralazine when necessary    DVT ppx: on Eliquis Code Status: Full code Family Communication: Yes, patient's wife at bed side Disposition Plan:  Anticipate discharge back to previous home environment Consults called:  none Admission status: Obs / tele   Date of Service 11/03/2015    Ivor Costa [facility] Pager [phone]  If 7PM-7AM, please contact night-coverage [email or web address] Password [facility] 11/03/2015, 10:27 PM

## 2015-11-03 NOTE — Progress Notes (Signed)
Pre visit review using our clinic review tool, if applicable. No additional management support is needed unless otherwise documented below in the visit note. 

## 2015-11-03 NOTE — ED Provider Notes (Signed)
[facility] DEPT Provider Note   CSN: [id number] Arrival date & time: 11/03/15  [id number]  First Provider Contact:  First MD Initiated Contact with Patient 11/03/15 2044        History   Chief Complaint Chief Complaint  Patient presents with  . Abnormal Lab    HPI [name]. is a 82 y.o. male.  60 yo M with a cc of weakness. Going on for past couple days.  Having diarrhea for past week or so.  Recently admitted for diarrhea and weakness though cdiff.  Had multiple complications.  Saw PCP for follow up had labs drawn and told to come to ED due to low sodium.    The history is provided by the patient.  Abnormal Lab  Weakness  This is a new problem. The current episode started more than 2 days ago. The problem occurs constantly. The problem has not changed since onset.Pertinent negatives include no chest pain, no abdominal pain, no headaches and no shortness of breath. Nothing aggravates the symptoms. Nothing relieves the symptoms. He has tried nothing for the symptoms. The treatment provided no relief.    Past Medical History:  Diagnosis Date  . Anxiety   . Arthritis   . ASTHMA   . Benign liver cyst   . CAD (coronary artery disease)    a. s/p CABG;  b. cath 06/27/10: S-Dx occluded, S-PDA 80-90% (tx with PCI); S-OM ok, L-LAD ok;  EF 65-70%  c.  s/p Promus DES to S-PDA 06/2010;   d. Cath 8 2016 LIMA to the LAD patent, SVG to posterior lateral subtotal, SVG to diagonal occluded chronically. The patient had stenting of the native right vessel.  . Cataract    surgery to both eyes  . Chronic diastolic CHF (congestive heart failure), NYHA class 2 ([place])   . CKD (chronic kidney disease), stage II   . COPD (chronic obstructive pulmonary disease) ([place])   . Depression   . Diverticulosis   . DYSLIPIDEMIA   . Esophageal stricture   . Essential hypertension    Echo 3/12: EF 55-60%; mod LVH; mild AS/AI; LAE; PASP 38; mild pulmo HTN  . GERD    with HH, hx esophageal stricture  .  Glaucoma   . Hiatal hernia   . Osteoporosis   . PAF (paroxysmal atrial fibrillation) ([place])   . Personal history of alcoholism ([place])   . Pneumonia 04/2013 hosp  . Skin cancer    L forearm  . SLEEP APNEA 09/2001   NPSG AHI 22/HR  . Subclavian arterial stenosis ([facility])    a. s/p PTCA of subclavian 2016.    Patient Active Problem List   Diagnosis Date Noted  . UTI (lower urinary tract infection) 11/03/2015  . CKD (chronic kidney disease), stage II 10/21/2015  . Diarrhea 10/21/2015  . Symptomatic bradycardia 10/17/2015  . Chronic diastolic CHF (congestive heart failure), NYHA class 2 ([place])   . COPD (chronic obstructive pulmonary disease) ([place]) 09/23/2015  . COPD exacerbation ([place]) 09/23/2015  . Sleeping difficulties 09/08/2015  . Cerebral embolism with cerebral infarction 05/18/2015  . Coronary artery disease involving native coronary artery of native heart   . Numbness and tingling in right hand 05/17/2015  . Hyponatremia 05/17/2015  . Essential hypertension   . Intermittent confusion   . Cough 04/13/2015  . PAD (peripheral artery disease) ([place]) 02/22/2015  . Subclavian artery stenosis, left 12/23/2014  . NSTEMI (non-ST elevated myocardial infarction) ([place])   . Hx of CABG 2008 08/31/2014  .  CAD S/P S-PDA DES 2012 08/31/2014  . PVD (peripheral vascular disease) ([place]) 08/31/2014  . Murmur 08/31/2014  . Anxiety about health 08/31/2014  . Edema 07/27/2014  . Hypertensive urgency 07/20/2014  . Abnormal chest x-ray 03/26/2014  . Abnormal CT scan, esophagus 01/07/2014  . Generalized anxiety disorder   . Anemia, iron deficiency   . PAF (paroxysmal atrial fibrillation) ([place]) 03/08/2013  . Chronic diastolic heart failure ([place]) 10/30/2012  . Osteoporosis/severe osteopenia 06/12/2012  . Bruit of left carotid artery 09/27/2011  . GERD 11/19/2009  . Hyperlipidemia with target LDL less than 100 10/04/2009  . ESOPHAGEAL STRICTURE 12/03/2007  . Coronary atherosclerosis 05/10/2007  .  Seasonal and perennial allergic rhinitis 05/10/2007  . Asthma with COPD ([place]) 05/10/2007  . Obstructive sleep apnea 05/10/2007    Past Surgical History:  Procedure Laterality Date  . CARDIAC CATHETERIZATION N/A 11/26/2014   Procedure: Left Heart Cath and Cors/Grafts Angiography;  Surgeon: Sherren Mocha, MD;  Location: [facility];  Service: Cardiovascular;  Laterality: N/A;  . CARDIAC CATHETERIZATION N/A 11/26/2014   Procedure: Coronary Stent Intervention;  Surgeon: Sherren Mocha, MD;  Location: [facility];  Service: Cardiovascular;  Laterality: N/A;  . CATARACT EXTRACTION, BILATERAL  2007  . CHOLECYSTECTOMY  02/21/2011   Procedure: LAPAROSCOPIC CHOLECYSTECTOMY WITH INTRAOPERATIVE CHOLANGIOGRAM;  Surgeon: Pedro Earls, MD;  Location: [facility];  Service: General;  Laterality: N/A;  . CORONARY ANGIOPLASTY WITH STENT PLACEMENT  07/2010  . CORONARY ARTERY BYPASS GRAFT  02/2007  . HERNIA REPAIR  unsure ?60's  . PERIPHERAL VASCULAR CATHETERIZATION N/A 02/22/2015   Procedure: Upper Extremity Angiography;  Surgeon: Lorretta Harp, MD;  Location: [facility];  Service: Cardiovascular;  Laterality: N/A;  . TONSILLECTOMY         Home Medications    Prior to Admission medications   Medication Sig Start Date End Date Taking? Authorizing Provider  acetaminophen (TYLENOL) 325 MG tablet Take 650 mg by mouth every 6 (six) hours as needed for moderate pain or headache.    Yes Historical Provider, MD  albuterol (PROAIR HFA) 108 (90 Base) MCG/ACT inhaler Inhale 2 puffs into the lungs every 6 (six) hours as needed for wheezing or shortness of breath. 08/16/15  Yes Deneise Lever, MD  apixaban (ELIQUIS) 5 MG TABS tablet Take 1 tablet (5 mg total) by mouth 2 (two) times daily. 05/20/15  Yes Minus Breeding, MD  bisoprolol (ZEBETA) 10 MG tablet Take 2 tablets (20 mg total) by mouth daily. 11/03/15  Yes Binnie Rail, MD  calcium-vitamin D (OSCAL WITH D) 500-200 MG-UNIT per tablet Take 1  tablet by mouth 2 (two) times daily.   Yes Historical Provider, MD  cholestyramine (QUESTRAN) 4 g packet MIX AND DRINK 1 PACKET BY MOUTH TWICE A DAY IF NEEDED FOR LOOSE STOOLS Patient taking differently: Take 8 g by mouth daily after lunch. 2 packets 07/13/15  Yes Irene Shipper, MD  clopidogrel (PLAVIX) 75 MG tablet Take 1 tablet (75 mg total) by mouth daily with breakfast. 09/27/15  Yes Minus Breeding, MD  dexlansoprazole (DEXILANT) 60 MG capsule Take 1 capsule (60 mg total) by mouth 2 (two) times daily before a meal. 11/03/15  Yes Binnie Rail, MD  famotidine (PEPCID) 20 MG tablet Take 1 tablet (20 mg total) by mouth daily. 10/21/15  Yes Dayna N Dunn, PA-C  ipratropium-albuterol (DUONEB) 0.5-2.5 (3) MG/3ML SOLN USE ONE VIAL VIA NEBULIZER EVERY 6 HOURS AS NEEDED FOR SHORTNESS OF BREATH Patient taking differently:  USE ONE VIAL VIA NEBULIZER THREE TIMES DAILY 12/17/14  Yes Deneise Lever, MD  isosorbide mononitrate (IMDUR) 60 MG 24 hr tablet Take 1 tablet (60 mg total) by mouth 2 (two) times daily. 05/19/15  Yes Shanker Kristeen Mans, MD  latanoprost (XALATAN) 0.005 % ophthalmic solution Place 1 drop into both eyes at bedtime.    Yes Historical Provider, MD  LORazepam (ATIVAN) 0.5 MG tablet Take 0.5 mg by mouth 2 (two) times daily.   Yes Historical Provider, MD  mometasone-formoterol (DULERA) 100-5 MCG/ACT AERO Inhale 2 puffs into the lungs 2 (two) times daily. Rinse mouth after use   Yes Historical Provider, MD  montelukast (SINGULAIR) 10 MG tablet Take 1 tablet (10 mg total) by mouth at bedtime. 11/11/14  Yes Rowe Clack, MD  nitroGLYCERIN (NITROSTAT) 0.4 MG SL tablet Place 1 tablet (0.4 mg total) under the tongue every 5 (five) minutes as needed for chest pain. 07/13/14  Yes Minus Breeding, MD  pravastatin (PRAVACHOL) 80 MG tablet Take 1 tablet (80 mg total) by mouth every morning. Patient taking differently: Take 80 mg by mouth daily.  07/29/15  Yes Minus Breeding, MD  Probiotic Product (PROBIOTIC DAILY)  CAPS Take 1 capsule by mouth 2 (two) times daily.    Yes Historical Provider, MD  RA VITAMIN B-12 TR 1000 MCG TBCR take 1 tablet by mouth once daily 01/23/14  Yes Rowe Clack, MD  spironolactone (ALDACTONE) 25 MG tablet Take 1 tablet (25 mg total) by mouth daily. 10/21/15  Yes Dayna N Dunn, PA-C  torsemide (DEMADEX) 20 MG tablet Take 1 tablet (20 mg total) by mouth daily. 10/21/15  Yes Dayna N Dunn, PA-C  traZODone (DESYREL) 50 MG tablet Take 1 tablet (50 mg total) by mouth at bedtime as needed for sleep. 11/03/15  Yes Binnie Rail, MD  valsartan (DIOVAN) 320 MG tablet Take 1 tablet (320 mg total) by mouth daily. 08/10/15  Yes Binnie Rail, MD  PRESCRIPTION MEDICATION Inhale into the lungs at bedtime. CPAP/ 12    Historical Provider, MD  PRESCRIPTION MEDICATION Inject 1 each into the skin once a week. Allergy shots from Dr. Annamaria Boots (1:10) - self-administered - usually Thursday or Friday    Historical Provider, MD    Family History Family History  Problem Relation Age of Onset  . COPD Sister   . Asthma Sister   . Emphysema Sister   . Hypertension Sister   . Hyperlipidemia Sister   . Hypertension Mother   . Heart disease Mother   . Hyperlipidemia Mother   . Heart disease Father   . Hyperlipidemia Father   . Hypertension Father   . Heart disease Brother   . Colon cancer Neg Hx     Social History Social History  Substance Use Topics  . Smoking status: Former Smoker    Packs/day: 3.00    Years: 40.00    Types: Cigarettes    Quit date: 04/11/1979  . Smokeless tobacco: Never Used  . Alcohol use No     Comment: quit drinking 40+ years     Allergies   Ace inhibitors; Avelox [moxifloxacin hcl in nacl]; Tape; Lidocaine; Morphine and related; Codeine; Lasix [furosemide]; and Other   Review of Systems Review of Systems  Constitutional: Negative for chills and fever.  HENT: Negative for congestion and facial swelling.   Eyes: Negative for discharge and visual disturbance.    Respiratory: Negative for shortness of breath.   Cardiovascular: Negative for chest pain and palpitations.  Gastrointestinal: Positive for diarrhea. Negative for abdominal pain and vomiting.  Musculoskeletal: Negative for arthralgias and myalgias.  Skin: Negative for color change and rash.  Neurological: Positive for weakness. Negative for tremors, syncope and headaches.  Psychiatric/Behavioral: Negative for confusion and dysphoric mood.     Physical Exam Updated Vital Signs BP 187/82   Pulse 86   Temp 98.8 F (37.1 C) (Oral)   Resp 14   Ht 5\' 2"  (1.575 m)   Wt 149 lb (67.6 kg)   SpO2 100%   BMI 27.25 kg/m   Physical Exam  Constitutional: He is oriented to person, place, and time. He appears well-developed and well-nourished.  HENT:  Head: Normocephalic and atraumatic.  Eyes: Conjunctivae and EOM are normal. Pupils are equal, round, and reactive to light.  Neck: Normal range of motion. No JVD present.  Cardiovascular: Normal rate and regular rhythm.   Pulmonary/Chest: Effort normal. No stridor. No respiratory distress.  Abdominal: He exhibits no distension. There is no tenderness. There is no guarding.  Musculoskeletal: Normal range of motion. He exhibits no edema.  Neurological: He is alert and oriented to person, place, and time.  Skin: Skin is warm and dry.  Psychiatric: He has a normal mood and affect. His behavior is normal.     ED Treatments / Results  Labs (all labs ordered are listed, but only abnormal results are displayed) Labs Reviewed  CBC WITH DIFFERENTIAL/PLATELET - Abnormal; Notable for the following:       Result Value   WBC 11.7 (*)    RBC 3.74 (*)    Hemoglobin 11.3 (*)    HCT 32.2 (*)    RDW 15.8 (*)    Neutro Abs 8.6 (*)    Monocytes Absolute 1.2 (*)    All other components within normal limits  COMPREHENSIVE METABOLIC PANEL - Abnormal; Notable for the following:    Sodium 120 (*)    Chloride 87 (*)    Glucose, Bld 102 (*)    Total  Protein 6.3 (*)    Albumin 3.2 (*)    All other components within normal limits  URINALYSIS, ROUTINE W REFLEX MICROSCOPIC (NOT AT [facility]) - Abnormal; Notable for the following:    Specific Gravity, Urine <1.005 (*)    Hgb urine dipstick TRACE (*)    Nitrite POSITIVE (*)    Leukocytes, UA MODERATE (*)    All other components within normal limits  URINE MICROSCOPIC-ADD ON - Abnormal; Notable for the following:    Bacteria, UA FEW (*)    All other components within normal limits  OSMOLALITY - Abnormal; Notable for the following:    Osmolality 258 (*)    All other components within normal limits  C DIFFICILE QUICK SCREEN W PCR REFLEX  URINE CULTURE  SODIUM, URINE, RANDOM  CREATININE, URINE, RANDOM  BASIC METABOLIC PANEL  BASIC METABOLIC PANEL  BRAIN NATRIURETIC PEPTIDE  OSMOLALITY, URINE  CBC    EKG  EKG Interpretation None       Radiology No results found.  Procedures Procedures (including critical care time)  Medications Ordered in ED Medications  bisoprolol (ZEBETA) tablet 20 mg (not administered)  traZODone (DESYREL) tablet 50 mg (not administered)  famotidine (PEPCID) tablet 20 mg (not administered)  LORazepam (ATIVAN) tablet 0.5 mg (not administered)  mometasone-formoterol (DULERA) 100-5 MCG/ACT inhaler 2 puff (2 puffs Inhalation Not Given 11/03/15 2230)  clopidogrel (PLAVIX) tablet 75 mg (not administered)  irbesartan (AVAPRO) tablet 300 mg (not administered)  pravastatin (PRAVACHOL) tablet 80 mg (  not administered)  cholestyramine (QUESTRAN) packet 8 g (not administered)  apixaban (ELIQUIS) tablet 5 mg (5 mg Oral Given 11/03/15 2313)  isosorbide mononitrate (IMDUR) 24 hr tablet 60 mg (60 mg Oral Given 11/03/15 2313)  PROBIOTIC DAILY CAPS 1 capsule (not administered)  acetaminophen (TYLENOL) tablet 650 mg (not administered)  montelukast (SINGULAIR) tablet 10 mg (not administered)  nitroGLYCERIN (NITROSTAT) SL tablet 0.4 mg (not administered)  Cyanocobalamin TBCR  1,000 mcg (not administered)  calcium-vitamin D (OSCAL WITH D) 500-200 MG-UNIT per tablet 1 tablet (not administered)  latanoprost (XALATAN) 0.005 % ophthalmic solution 1 drop (not administered)  0.9 %  sodium chloride infusion (not administered)  cefTRIAXone (ROCEPHIN) 1 g in dextrose 5 % 50 mL IVPB (not administered)  dextromethorphan-guaiFENesin (MUCINEX DM) 30-600 MG per 12 hr tablet 1 tablet (not administered)  sodium chloride flush (NS) 0.9 % injection 3 mL (not administered)  ondansetron (ZOFRAN) tablet 4 mg (not administered)    Or  ondansetron (ZOFRAN) injection 4 mg (not administered)  albuterol (PROVENTIL) (2.5 MG/3ML) 0.083% nebulizer solution 2.5 mg (not administered)  sodium chloride 0.9 % bolus 1,000 mL (0 mLs Intravenous Stopped 11/03/15 2221)     Initial Impression / Assessment and Plan / ED Course  I have reviewed the triage vital signs and the nursing notes.  Pertinent labs & imaging results that were available during my care of the patient were reviewed by me and considered in my medical decision making (see chart for details).  Clinical Course    82 yo M With a chief complaint of hyponatremia. Patient's found to have a sodium of 119. Suspected secondary to diarrhea. Was given a bolus of IV fluids. Place in observation.  CRITICAL CARE Performed by: Cecilio Asper   Total critical care time: 35 minutes  Critical care time was exclusive of separately billable procedures and treating other patients.  Critical care was necessary to treat or prevent imminent or life-threatening deterioration.  Critical care was time spent personally by me on the following activities: development of treatment plan with patient and/or surrogate as well as nursing, discussions with consultants, evaluation of patient's response to treatment, examination of patient, obtaining history from patient or surrogate, ordering and performing treatments and interventions, ordering and review  of laboratory studies, ordering and review of radiographic studies, pulse oximetry and re-evaluation of patient's condition.  The patients results and plan were reviewed and discussed.   Any x-rays performed were independently reviewed by myself.   Differential diagnosis were considered with the presenting HPI.  Medications  bisoprolol (ZEBETA) tablet 20 mg (not administered)  traZODone (DESYREL) tablet 50 mg (not administered)  famotidine (PEPCID) tablet 20 mg (not administered)  LORazepam (ATIVAN) tablet 0.5 mg (not administered)  mometasone-formoterol (DULERA) 100-5 MCG/ACT inhaler 2 puff (2 puffs Inhalation Not Given 11/03/15 2230)  clopidogrel (PLAVIX) tablet 75 mg (not administered)  irbesartan (AVAPRO) tablet 300 mg (not administered)  pravastatin (PRAVACHOL) tablet 80 mg (not administered)  cholestyramine (QUESTRAN) packet 8 g (not administered)  apixaban (ELIQUIS) tablet 5 mg (5 mg Oral Given 11/03/15 2313)  isosorbide mononitrate (IMDUR) 24 hr tablet 60 mg (60 mg Oral Given 11/03/15 2313)  PROBIOTIC DAILY CAPS 1 capsule (not administered)  acetaminophen (TYLENOL) tablet 650 mg (not administered)  montelukast (SINGULAIR) tablet 10 mg (not administered)  nitroGLYCERIN (NITROSTAT) SL tablet 0.4 mg (not administered)  Cyanocobalamin TBCR 1,000 mcg (not administered)  calcium-vitamin D (OSCAL WITH D) 500-200 MG-UNIT per tablet 1 tablet (not administered)  latanoprost (XALATAN) 0.005 % ophthalmic  solution 1 drop (not administered)  0.9 %  sodium chloride infusion (not administered)  cefTRIAXone (ROCEPHIN) 1 g in dextrose 5 % 50 mL IVPB (not administered)  dextromethorphan-guaiFENesin (MUCINEX DM) 30-600 MG per 12 hr tablet 1 tablet (not administered)  sodium chloride flush (NS) 0.9 % injection 3 mL (not administered)  ondansetron (ZOFRAN) tablet 4 mg (not administered)    Or  ondansetron (ZOFRAN) injection 4 mg (not administered)  albuterol (PROVENTIL) (2.5 MG/3ML) 0.083% nebulizer  solution 2.5 mg (not administered)  sodium chloride 0.9 % bolus 1,000 mL (0 mLs Intravenous Stopped 11/03/15 2221)    Vitals:   11/03/15 2130 11/03/15 2145 11/03/15 2200 11/03/15 2215  BP: 155/70 183/74 199/86 187/82  Pulse: 81 86 85 86  Resp: 15 13 12 14   Temp:      TempSrc:      SpO2: 100% 100% 100% 100%  Weight:      Height:        Final diagnoses:  Hyponatremia    Admission/ observation were discussed with the admitting physician, patient and/or family and they are comfortable with the plan.     Final Clinical Impressions(s) / ED Diagnoses   Final diagnoses:  Hyponatremia    New Prescriptions New Prescriptions   No medications on file     Deno Etienne, DO 11/03/15 2324

## 2015-11-03 NOTE — Progress Notes (Deleted)
HPI The patient presents for follow up of  CAD, s/p CABG, s/p Promus DES to S-PDA in 06/2010.  He was hospitalized last year with hypertensive urgency and NSTEMI.  He had subtotal stenosis of the vein graft to posterior lateral off the right coronary. He had stenting of the native right coronary.  The patient also was found to have subclavian stenosis.   He was later admitted for PCI of the subclavian.  He did have medication changes to include the addition of spironolactone and increased metoprolol.  He had an ileus postprocedure as well.  He was in the hospital again in Feb with chest pain. Enzymes were negative. There was a question of him having a CVA that he was seen by neurology and it was not clear that he had an acute event. He did have mild hyponatremia.  He was in the hospital again in June with COPD flair.  Most recently he was in the hospital with bradycardia.  I have reviewed the last to admission/progress and discharge notes for this clinic visit.   The bradycardia was significant and thought to be related to multiple electrolyte abnormalities and follow some episodes of diarrhea and treatment for possible C. difficile.   He did eventually recover from this.  Medication changes following that hospitalization included stopping diltiazem and reducing the dose of spironolactone and torsemide.    Med changes this admission outlined to the patient on AVS: STOP taking diltiazem, esomeprazole, metronidazole, prednisone, and Protonix. The doses of your spironolactone and torsemide have changed - NEW PRESCRIPTION. Start taking famotidine for acid reflux.  At the last visit I restarted Norvasc. He's been doing well. He did have one day where his heart rate was elevated and he is tachycardic. He actually called EMS. He said his heart rate was in the 120s. They did not suggest that he needed to be transported to the hospital. Slowly came down. Otherwise his blood pressures have been excellent and he  brings a blood pressure diary today. He has no acute complaints. He's not having any new shortness of breath, PND or orthopnea. He's had no new palpitations, presyncope or syncope.  Allergies  Allergen Reactions  . Ace Inhibitors Other (See Comments)    Severe asthma (COPD)  . Avelox [Moxifloxacin Hcl In Nacl] Other (See Comments)    Gum pain  . Tape Other (See Comments)    Adhesive and tape reaction-tears skin off   . Lidocaine Other (See Comments)    Possible reaction? Dizziness, confusion, syncope  . Morphine And Related Nausea And Vomiting  . Codeine Nausea And Vomiting  . Lasix [Furosemide] Itching and Swelling  . Other Other (See Comments)    Maple trees, allergy symptoms    Current Outpatient Prescriptions  Medication Sig Dispense Refill  . acetaminophen (TYLENOL) 325 MG tablet Take 650 mg by mouth every 6 (six) hours as needed for moderate pain or headache.     . albuterol (PROAIR HFA) 108 (90 Base) MCG/ACT inhaler Inhale 2 puffs into the lungs every 6 (six) hours as needed for wheezing or shortness of breath. 1 Inhaler 12  . apixaban (ELIQUIS) 5 MG TABS tablet Take 1 tablet (5 mg total) by mouth 2 (two) times daily. 60 tablet 11  . bisoprolol (ZEBETA) 10 MG tablet Take 2 tablets (20 mg total) by mouth daily. 60 tablet 5  . calcium-vitamin D (OSCAL WITH D) 500-200 MG-UNIT per tablet Take 1 tablet by mouth 2 (two) times daily.    Marland Kitchen  cholestyramine (QUESTRAN) 4 g packet MIX AND DRINK 1 PACKET BY MOUTH TWICE A DAY IF NEEDED FOR LOOSE STOOLS (Patient taking differently: Take 8 g by mouth daily after lunch. 2 packets) 60 packet 2  . clopidogrel (PLAVIX) 75 MG tablet Take 1 tablet (75 mg total) by mouth daily with breakfast. 30 tablet 8  . dexlansoprazole (DEXILANT) 60 MG capsule Take 1 capsule (60 mg total) by mouth 2 (two) times daily before a meal. 60 capsule 3  . famotidine (PEPCID) 20 MG tablet Take 1 tablet (20 mg total) by mouth daily. 30 tablet 1  . ipratropium-albuterol  (DUONEB) 0.5-2.5 (3) MG/3ML SOLN USE ONE VIAL VIA NEBULIZER EVERY 6 HOURS AS NEEDED FOR SHORTNESS OF BREATH (Patient taking differently: USE ONE VIAL VIA NEBULIZER THREE TIMES DAILY) 360 mL 5  . isosorbide mononitrate (IMDUR) 60 MG 24 hr tablet Take 1 tablet (60 mg total) by mouth 2 (two) times daily. 180 tablet 0  . latanoprost (XALATAN) 0.005 % ophthalmic solution Place 1 drop into both eyes at bedtime.     Marland Kitchen LORazepam (ATIVAN) 0.5 MG tablet Take 0.5 mg by mouth 2 (two) times daily.    . mometasone-formoterol (DULERA) 100-5 MCG/ACT AERO Inhale 2 puffs into the lungs 2 (two) times daily. Rinse mouth after use    . montelukast (SINGULAIR) 10 MG tablet Take 1 tablet (10 mg total) by mouth at bedtime. 90 tablet 3  . nitroGLYCERIN (NITROSTAT) 0.4 MG SL tablet Place 1 tablet (0.4 mg total) under the tongue every 5 (five) minutes as needed for chest pain. 25 tablet 0  . pravastatin (PRAVACHOL) 80 MG tablet Take 1 tablet (80 mg total) by mouth every morning. (Patient taking differently: Take 80 mg by mouth daily. ) 30 tablet 2  . PRESCRIPTION MEDICATION Inhale into the lungs at bedtime. CPAP/ 12    . PRESCRIPTION MEDICATION Inject 1 each into the skin once a week. Allergy shots from Dr. Annamaria Boots (1:10) - self-administered - usually Thursday or Friday    . Probiotic Product (PROBIOTIC DAILY) CAPS Take 1 capsule by mouth 2 (two) times daily.     Marland Kitchen RA VITAMIN B-12 TR 1000 MCG TBCR take 1 tablet by mouth once daily 30 tablet 11  . spironolactone (ALDACTONE) 25 MG tablet Take 1 tablet (25 mg total) by mouth daily. 30 tablet 2  . torsemide (DEMADEX) 20 MG tablet Take 1 tablet (20 mg total) by mouth daily. 30 tablet 2  . traZODone (DESYREL) 50 MG tablet Take 1 tablet (50 mg total) by mouth at bedtime as needed for sleep. 30 tablet 5  . valsartan (DIOVAN) 320 MG tablet Take 1 tablet (320 mg total) by mouth daily. 90 tablet 3   No current facility-administered medications for this visit.     Past Medical History:    Diagnosis Date  . Anxiety   . Arthritis   . ASTHMA   . Benign liver cyst   . CAD (coronary artery disease)    a. s/p CABG;  b. cath 06/27/10: S-Dx occluded, S-PDA 80-90% (tx with PCI); S-OM ok, L-LAD ok;  EF 65-70%  c.  s/p Promus DES to S-PDA 06/2010;   d. Cath 8 2016 LIMA to the LAD patent, SVG to posterior lateral subtotal, SVG to diagonal occluded chronically. The patient had stenting of the native right vessel.  . Cataract    surgery to both eyes  . Chronic diastolic CHF (congestive heart failure), NYHA class 2 ([place])   . CKD (chronic kidney disease),  stage II   . COPD (chronic obstructive pulmonary disease) ([place])   . Depression   . Diverticulosis   . DYSLIPIDEMIA   . Esophageal stricture   . Essential hypertension    Echo 3/12: EF 55-60%; mod LVH; mild AS/AI; LAE; PASP 38; mild pulmo HTN  . GERD    with HH, hx esophageal stricture  . Glaucoma   . Hiatal hernia   . Osteoporosis   . PAF (paroxysmal atrial fibrillation) ([place])   . Personal history of alcoholism ([place])   . Pneumonia 04/2013 hosp  . Skin cancer    L forearm  . SLEEP APNEA 09/2001   NPSG AHI 22/HR  . Subclavian arterial stenosis ([facility])    a. s/p PTCA of subclavian 2016.    Past Surgical History:  Procedure Laterality Date  . CARDIAC CATHETERIZATION N/A 11/26/2014   Procedure: Left Heart Cath and Cors/Grafts Angiography;  Surgeon: Sherren Mocha, MD;  Location: [facility];  Service: Cardiovascular;  Laterality: N/A;  . CARDIAC CATHETERIZATION N/A 11/26/2014   Procedure: Coronary Stent Intervention;  Surgeon: Sherren Mocha, MD;  Location: [place] [facility] [facility];  Service: Cardiovascular;  Laterality: N/A;  . CATARACT EXTRACTION, BILATERAL  2007  . CHOLECYSTECTOMY  02/21/2011   Procedure: LAPAROSCOPIC CHOLECYSTECTOMY WITH INTRAOPERATIVE CHOLANGIOGRAM;  Surgeon: Pedro Earls, MD;  Location: [facility];  Service: General;  Laterality: N/A;  . CORONARY ANGIOPLASTY WITH STENT PLACEMENT  07/2010  . CORONARY ARTERY  BYPASS GRAFT  02/2007  . HERNIA REPAIR  unsure ?60's  . PERIPHERAL VASCULAR CATHETERIZATION N/A 02/22/2015   Procedure: Upper Extremity Angiography;  Surgeon: Lorretta Harp, MD;  Location: [facility];  Service: Cardiovascular;  Laterality: N/A;  . TONSILLECTOMY      ROS:  As stated in the HPI and negative for all other systems.  PHYSICAL EXAM There were no vitals taken for this visit. GENERAL:  Chronically ill appearing but in no acute distress HEENT:  Pupils equal round and reactive, fundi not visualized, oral mucosa unremarkable, dentures NECK:  No jugular venous distention, waveform within normal limits, carotid upstroke brisk and symmetric, left carotid bruits, no thyromegaly LUNGS:  Clear to auscultation bilaterally CHEST:  Well healed sternotomy scar. HEART:  PMI not displaced or sustained,S1 and S2 within normal limits, no S3, no S4, no clicks, no rubs, soft apical, right upper sterna border early peaking murmur ABD:  Flat, positive bowel sounds normal in frequency in pitch, no bruits, no rebound, no guarding, no midline pulsatile mass, no hepatomegaly, no splenomegaly, healed abdominal scars. EXT:  2 plus pulses throughout, mild bilateral edema, no cyanosis no clubbing, right bruit.   SKIN:  No nodules, bruising  ASSESSMENT AND PLAN  CAD:  The patient has no new sypmtoms.  There was no objective evidence of ischemia when he was hospitalized recently. No change in therapy is indicated. He is now on Plavix as well as Eliquis.  He will remain on dual therapy until this summer at the one-year anniversary of his last intervention.  ATRIAL FIBRILLATION:    He has no Paroxysmal symptoms.  He will continue with Eliqus as above  HTN: His blood pressure seems to be better controlled. He will continue the meds as listed.  We talked about when necessary dosing of his metoprolol should he have any hypertensive urgencies such as he had on 3/7.  CAROTID STENOSIS:  I reviewed the  results from last year he had 40-59% bilateral stenosis.  He will have follow-up in September.  AS:  This was moderate on echo earlier this year. We will follow this clinically.  Extensive hospital charts reviewed.

## 2015-11-03 NOTE — ED Notes (Signed)
Attempted to call report. RN unavailable due to patient care

## 2015-11-03 NOTE — Telephone Encounter (Signed)
Wife call needing refill on pt Bisoprolol she states pharmacy has been trying to get refill, but haven't receive response. Per chart no request has came through sent refill to [facility]...Johny Chess

## 2015-11-03 NOTE — Assessment & Plan Note (Addendum)
Empirically treated with flagyl 7/6 for 10 days and symptoms improved, but have recurred Concern for cdiff Check stool cultures, cdiff Will start flagyl if cdiff is needed May need to see GI depending on stool results Check cbc, cmp today

## 2015-11-03 NOTE — Assessment & Plan Note (Signed)
Not controlled Has taken pepcid, omeprazole 40mg  and pantoprazole 40 mg BID without improvement Will try dexilant BID given severity - will see if it is covered Can also try prevacid May need to see GI

## 2015-11-03 NOTE — Assessment & Plan Note (Signed)
Was in the hospital and treated and has seen pulm Having cough and wheeze at times - may be from uncontrolled GERD Continue inhalers

## 2015-11-03 NOTE — Patient Instructions (Addendum)
We will check blood work and stool cultures.   Test(s) ordered today. Your results will be released to [place] (or called to you) after review, usually within 72hours after test completion. If any changes need to be made, you will be notified at that same time.  Medications reviewed and updated.  Changes include starting dexilant for your heartburn.    Your prescription(s) have been submitted to your pharmacy. Please take as directed and contact our office if you believe you are having problem(s) with the medication(s).  Please followup in 3 months

## 2015-11-03 NOTE — ED Triage Notes (Addendum)
Pt. advised by his PCP to go to ER due to abnormal blood test results ( hyponatremia) taken this morning . Denies any pain or discomfort , respirations unlabored . Pt. stated slight dizziness.

## 2015-11-03 NOTE — Progress Notes (Signed)
Subjective:    Patient ID: [name]., male    DOB: 1931-01-27, 82 y.o.   MRN: [id number]  HPI He is here for follow up.  CABG 2008, DES SVG-PDA 2012, NSTEMI 2016 w/ SVG-PL 99% (s/p stent RCA), PAD w/ PCI subclavian 2016, atrial fib on Eliquis, OSA, GERD, HTN, HLD, COPD, hyponatremia, D-CHF.  He has been in the hospital a couple of times since he was here last.  He was admitted for a COPD exacerbation treated with steroids and antibiotics. He was admitted for hypovolemia with diarrhea, electrolyte imbalance and symptomatic bradycardia.  He was treated with IVF, atropine and electrolyte correction.  PPI was stopped.   Diarrhea:  After his first hospitalization (09/22/15) he started having diarrhea.  He was having multiple stools a day that were non-bloody.  He was prescribed flagyl on 10/14/15 for empiric treatment of cdiff.  He took it for 10 days and it did help, it stopped for a couple of days, but returned and is not as bad. His stool is slightly formed at times.  He has diarrhea 4-6 times a day, no abdominal pain, no fever, no blood in the stool.  His appetite is good. His weight is stable.    GERD:  He is taking pepcid daily and he has daily uncontrolled GERD.  He was on protonix twice daily and his heartburn was not controlled - this was d/c'd in the hospital.  He has taken  omeprazole 20 mg 1-2 times a day, 40 mg daily and pantoprazole twice daily and none have helped.   Medications and allergies reviewed with patient and updated if appropriate.  Patient Active Problem List   Diagnosis Date Noted  . Acute kidney injury superimposed on chronic kidney disease ([place]) 10/21/2015  . Hyperkalemia 10/21/2015  . Diarrhea 10/21/2015  . Symptomatic bradycardia 10/17/2015  . Chronic diastolic CHF (congestive heart failure), NYHA class 2 ([place])   . COPD (chronic obstructive pulmonary disease) ([place]) 09/23/2015  . COPD exacerbation ([place]) 09/23/2015  . Sleeping difficulties 09/08/2015  .  Cerebral embolism with cerebral infarction 05/18/2015  . Coronary artery disease involving native coronary artery of native heart   . Numbness and tingling in right hand 05/17/2015  . Hyponatremia 05/17/2015  . Essential hypertension   . Intermittent confusion   . Cough 04/13/2015  . PAD (peripheral artery disease) ([place]) 02/22/2015  . Subclavian artery stenosis, left 12/23/2014  . NSTEMI (non-ST elevated myocardial infarction) ([place])   . Tachycardia 11/24/2014  . Hx of CABG 2008 08/31/2014  . CAD S/P S-PDA DES 2012 08/31/2014  . PVD (peripheral vascular disease) ([place]) 08/31/2014  . Murmur 08/31/2014  . Anxiety about health 08/31/2014  . Edema 07/27/2014  . Hypertensive urgency 07/20/2014  . Abnormal chest x-ray 03/26/2014  . Abnormal CT scan, esophagus 01/07/2014  . Generalized anxiety disorder   . Anemia, iron deficiency   . PAF (paroxysmal atrial fibrillation) ([place]) 03/08/2013  . Chronic diastolic heart failure ([place]) 10/30/2012  . Osteoporosis/severe osteopenia 06/12/2012  . Bruit of left carotid artery 09/27/2011  . GERD 11/19/2009  . Hyperlipidemia with target LDL less than 100 10/04/2009  . ESOPHAGEAL STRICTURE 12/03/2007  . Coronary atherosclerosis 05/10/2007  . Seasonal and perennial allergic rhinitis 05/10/2007  . Asthma with COPD ([place]) 05/10/2007  . Obstructive sleep apnea 05/10/2007    Current Outpatient Prescriptions on File Prior to Visit  Medication Sig Dispense Refill  . acetaminophen (TYLENOL) 325 MG tablet Take 650 mg by mouth every 6 (six)  hours as needed for moderate pain or headache.     . albuterol (PROAIR HFA) 108 (90 Base) MCG/ACT inhaler Inhale 2 puffs into the lungs every 6 (six) hours as needed for wheezing or shortness of breath. 1 Inhaler 12  . apixaban (ELIQUIS) 5 MG TABS tablet Take 1 tablet (5 mg total) by mouth 2 (two) times daily. 60 tablet 11  . calcium-vitamin D (OSCAL WITH D) 500-200 MG-UNIT per tablet Take 1 tablet by mouth 2 (two) times  daily.    . cholestyramine (QUESTRAN) 4 g packet MIX AND DRINK 1 PACKET BY MOUTH TWICE A DAY IF NEEDED FOR LOOSE STOOLS (Patient taking differently: Take 8 g by mouth daily after lunch. 2 packets) 60 packet 2  . clopidogrel (PLAVIX) 75 MG tablet Take 1 tablet (75 mg total) by mouth daily with breakfast. 30 tablet 8  . famotidine (PEPCID) 20 MG tablet Take 1 tablet (20 mg total) by mouth daily. 30 tablet 1  . ipratropium-albuterol (DUONEB) 0.5-2.5 (3) MG/3ML SOLN USE ONE VIAL VIA NEBULIZER EVERY 6 HOURS AS NEEDED FOR SHORTNESS OF BREATH (Patient taking differently: USE ONE VIAL VIA NEBULIZER THREE TIMES DAILY) 360 mL 5  . isosorbide mononitrate (IMDUR) 60 MG 24 hr tablet Take 1 tablet (60 mg total) by mouth 2 (two) times daily. 180 tablet 0  . latanoprost (XALATAN) 0.005 % ophthalmic solution Place 1 drop into both eyes at bedtime.     Marland Kitchen LORazepam (ATIVAN) 0.5 MG tablet Take 0.5 mg by mouth 2 (two) times daily.    . mometasone-formoterol (DULERA) 100-5 MCG/ACT AERO Inhale 2 puffs into the lungs 2 (two) times daily. Rinse mouth after use    . montelukast (SINGULAIR) 10 MG tablet Take 1 tablet (10 mg total) by mouth at bedtime. 90 tablet 3  . nitroGLYCERIN (NITROSTAT) 0.4 MG SL tablet Place 1 tablet (0.4 mg total) under the tongue every 5 (five) minutes as needed for chest pain. 25 tablet 0  . pravastatin (PRAVACHOL) 80 MG tablet Take 1 tablet (80 mg total) by mouth every morning. (Patient taking differently: Take 80 mg by mouth daily. ) 30 tablet 2  . PRESCRIPTION MEDICATION Inhale into the lungs at bedtime. CPAP/ 12    . PRESCRIPTION MEDICATION Inject 1 each into the skin once a week. Allergy shots from Dr. Annamaria Boots (1:10) - self-administered - usually Thursday or Friday    . Probiotic Product (PROBIOTIC DAILY) CAPS Take 1 capsule by mouth 2 (two) times daily.     Marland Kitchen RA VITAMIN B-12 TR 1000 MCG TBCR take 1 tablet by mouth once daily 30 tablet 11  . spironolactone (ALDACTONE) 25 MG tablet Take 1 tablet (25  mg total) by mouth daily. 30 tablet 2  . torsemide (DEMADEX) 20 MG tablet Take 1 tablet (20 mg total) by mouth daily. 30 tablet 2  . traZODone (DESYREL) 50 MG tablet Take 0.5-1 tablets (25-50 mg total) by mouth at bedtime as needed for sleep. (Patient taking differently: Take 25 mg by mouth at bedtime as needed for sleep. ) 30 tablet 3  . valsartan (DIOVAN) 320 MG tablet Take 1 tablet (320 mg total) by mouth daily. 90 tablet 3   No current facility-administered medications on file prior to visit.     Past Medical History:  Diagnosis Date  . Anxiety   . Arthritis   . ASTHMA   . Benign liver cyst   . CAD (coronary artery disease)    a. s/p CABG;  b. cath 06/27/10: S-Dx occluded,  S-PDA 80-90% (tx with PCI); S-OM ok, L-LAD ok;  EF 65-70%  c.  s/p Promus DES to S-PDA 06/2010;   d. Cath 8 2016 LIMA to the LAD patent, SVG to posterior lateral subtotal, SVG to diagonal occluded chronically. The patient had stenting of the native right vessel.  . Cataract    surgery to both eyes  . Chronic diastolic CHF (congestive heart failure), NYHA class 2 ([place])   . CKD (chronic kidney disease), stage II   . COPD (chronic obstructive pulmonary disease) ([place])   . Depression   . Diverticulosis   . DYSLIPIDEMIA   . Esophageal stricture   . Essential hypertension    Echo 3/12: EF 55-60%; mod LVH; mild AS/AI; LAE; PASP 38; mild pulmo HTN  . GERD    with HH, hx esophageal stricture  . Glaucoma   . Hiatal hernia   . Osteoporosis   . PAF (paroxysmal atrial fibrillation) ([place])   . Personal history of alcoholism ([place])   . Pneumonia 04/2013 hosp  . Skin cancer    L forearm  . SLEEP APNEA 09/2001   NPSG AHI 22/HR  . Subclavian arterial stenosis ([facility])    a. s/p PTCA of subclavian 2016.    Past Surgical History:  Procedure Laterality Date  . CARDIAC CATHETERIZATION N/A 11/26/2014   Procedure: Left Heart Cath and Cors/Grafts Angiography;  Surgeon: Sherren Mocha, MD;  Location: [facility];  Service:  Cardiovascular;  Laterality: N/A;  . CARDIAC CATHETERIZATION N/A 11/26/2014   Procedure: Coronary Stent Intervention;  Surgeon: Sherren Mocha, MD;  Location: [place] [facility];  Service: Cardiovascular;  Laterality: N/A;  . CATARACT EXTRACTION, BILATERAL  2007  . CHOLECYSTECTOMY  02/21/2011   Procedure: LAPAROSCOPIC CHOLECYSTECTOMY WITH INTRAOPERATIVE CHOLANGIOGRAM;  Surgeon: Pedro Earls, MD;  Location: [facility];  Service: General;  Laterality: N/A;  . CORONARY ANGIOPLASTY WITH STENT PLACEMENT  07/2010  . CORONARY ARTERY BYPASS GRAFT  02/2007  . HERNIA REPAIR  unsure ?60's  . PERIPHERAL VASCULAR CATHETERIZATION N/A 02/22/2015   Procedure: Upper Extremity Angiography;  Surgeon: Lorretta Harp, MD;  Location: [facility];  Service: Cardiovascular;  Laterality: N/A;  . TONSILLECTOMY      Social History   Social History  . Marital status: Married    Spouse name: N/A  . Number of children: 2  . Years of education: N/A   Occupational History  . retired Retired    47 years   Social History Main Topics  . Smoking status: Former Smoker    Packs/day: 3.00    Years: 40.00    Types: Cigarettes    Quit date: 04/11/1979  . Smokeless tobacco: Never Used  . Alcohol use No     Comment: quit drinking 40+ years  . Drug use: No  . Sexual activity: Yes   Other Topics Concern  . 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 Married lives with wife 2 children    Family History  Problem Relation Age of Onset  . COPD Sister   . Asthma Sister   . Emphysema Sister   . Hypertension Sister   . Hyperlipidemia Sister   . Hypertension Mother   . Heart disease Mother   . Hyperlipidemia Mother   . Colon cancer Neg Hx   . Heart disease Brother   . Heart disease Father   . Hyperlipidemia Father   . Hypertension Father     Review of Systems  Constitutional: Negative for fever.  HENT: Negative for trouble swallowing.   Respiratory: Positive for cough  (dry, occasional white phlegm) and wheezing (little). Negative for shortness of breath.   Cardiovascular: Negative for chest pain, palpitations and leg swelling.  Gastrointestinal: Positive for diarrhea. Negative for abdominal pain, blood in stool and nausea.       GERD  Neurological: Negative for light-headedness and headaches.       Objective:   Vitals:   11/03/15 1332  BP: (!) 160/66  Pulse: 70  Resp: 18  Temp: 98 F (36.7 C)   Filed Weights   11/03/15 1332  Weight: 148 lb (67.1 kg)   Body mass index is 27.07 kg/m.   Physical Exam Constitutional: She appears well-developed and well-nourished. No distress.  HENT:  Head: Normocephalic and atraumatic.  Neck: Neck supple. No tracheal deviation present. No thyromegaly present.  Cardiovascular: Normal rate, regular rhythm and normal heart sounds.   2/6 systolic murmur heard. No carotid bruit  Pulmonary/Chest: Effort normal and breath sounds normal. No respiratory distress. She has no wheezes. She has no rales.  Musculoskeletal: he exhibits no edema - wearing compression socks.  Lymphadenopathy:    She has no cervical adenopathy.  Skin: Skin is warm and dry. She is not diaphoretic.  Psychiatric: She has a normal mood and affect. Her behavior is normal.           Assessment & Plan:   See Problem List for Assessment and Plan of chronic medical problems.

## 2015-11-03 NOTE — ED Notes (Signed)
Dr. Niu ( admitting MD ) at bedside .  

## 2015-11-04 ENCOUNTER — Observation Stay (HOSPITAL_COMMUNITY): Payer: Medicare Other

## 2015-11-04 ENCOUNTER — Encounter (HOSPITAL_COMMUNITY): Payer: Self-pay | Admitting: Physician Assistant

## 2015-11-04 ENCOUNTER — Ambulatory Visit: Payer: Medicare Other | Admitting: Cardiology

## 2015-11-04 DIAGNOSIS — E871 Hypo-osmolality and hyponatremia: Secondary | ICD-10-CM

## 2015-11-04 DIAGNOSIS — J449 Chronic obstructive pulmonary disease, unspecified: Secondary | ICD-10-CM | POA: Diagnosis not present

## 2015-11-04 DIAGNOSIS — E785 Hyperlipidemia, unspecified: Secondary | ICD-10-CM

## 2015-11-04 DIAGNOSIS — K529 Noninfective gastroenteritis and colitis, unspecified: Secondary | ICD-10-CM | POA: Diagnosis not present

## 2015-11-04 DIAGNOSIS — K219 Gastro-esophageal reflux disease without esophagitis: Secondary | ICD-10-CM

## 2015-11-04 DIAGNOSIS — I10 Essential (primary) hypertension: Secondary | ICD-10-CM

## 2015-11-04 DIAGNOSIS — R079 Chest pain, unspecified: Secondary | ICD-10-CM | POA: Diagnosis not present

## 2015-11-04 DIAGNOSIS — J45909 Unspecified asthma, uncomplicated: Secondary | ICD-10-CM

## 2015-11-04 DIAGNOSIS — R197 Diarrhea, unspecified: Secondary | ICD-10-CM | POA: Diagnosis not present

## 2015-11-04 DIAGNOSIS — F32A Depression, unspecified: Secondary | ICD-10-CM | POA: Diagnosis present

## 2015-11-04 DIAGNOSIS — I5032 Chronic diastolic (congestive) heart failure: Secondary | ICD-10-CM | POA: Diagnosis not present

## 2015-11-04 DIAGNOSIS — F329 Major depressive disorder, single episode, unspecified: Secondary | ICD-10-CM | POA: Diagnosis present

## 2015-11-04 DIAGNOSIS — I2510 Atherosclerotic heart disease of native coronary artery without angina pectoris: Secondary | ICD-10-CM

## 2015-11-04 DIAGNOSIS — R05 Cough: Secondary | ICD-10-CM | POA: Diagnosis not present

## 2015-11-04 DIAGNOSIS — N390 Urinary tract infection, site not specified: Secondary | ICD-10-CM

## 2015-11-04 DIAGNOSIS — D509 Iron deficiency anemia, unspecified: Secondary | ICD-10-CM | POA: Diagnosis not present

## 2015-11-04 DIAGNOSIS — I739 Peripheral vascular disease, unspecified: Secondary | ICD-10-CM

## 2015-11-04 LAB — BASIC METABOLIC PANEL
ANION GAP: 6 (ref 5–15)
ANION GAP: 5 (ref 5–15)
Anion gap: 8 (ref 5–15)
Anion gap: 6 (ref 5–15)
BUN: 14 mg/dL (ref 6–20)
BUN: 11 mg/dL (ref 6–20)
BUN: 10 mg/dL (ref 6–20)
BUN: 11 mg/dL (ref 6–20)
CALCIUM: 8.4 mg/dL — AB (ref 8.9–10.3)
CALCIUM: 8.5 mg/dL — AB (ref 8.9–10.3)
CO2: 24 mmol/L (ref 22–32)
CO2: 25 mmol/L (ref 22–32)
CO2: 25 mmol/L (ref 22–32)
CO2: 25 mmol/L (ref 22–32)
CREATININE: 1.04 mg/dL (ref 0.61–1.24)
CREATININE: 0.86 mg/dL (ref 0.61–1.24)
Calcium: 8.2 mg/dL — ABNORMAL LOW (ref 8.9–10.3)
Calcium: 8.5 mg/dL — ABNORMAL LOW (ref 8.9–10.3)
Chloride: 91 mmol/L — ABNORMAL LOW (ref 101–111)
Chloride: 93 mmol/L — ABNORMAL LOW (ref 101–111)
Chloride: 96 mmol/L — ABNORMAL LOW (ref 101–111)
Chloride: 95 mmol/L — ABNORMAL LOW (ref 101–111)
Creatinine, Ser: 0.95 mg/dL (ref 0.61–1.24)
Creatinine, Ser: 0.97 mg/dL (ref 0.61–1.24)
GFR calc Af Amer: >60 mL/min (ref >60)
GFR calc Af Amer: >60 mL/min (ref >60)
GLUCOSE: 87 mg/dL (ref 65–99)
GLUCOSE: 100 mg/dL — AB (ref 65–99)
Glucose, Bld: 144 mg/dL — ABNORMAL HIGH (ref 65–99)
Glucose, Bld: 129 mg/dL — ABNORMAL HIGH (ref 65–99)
Potassium: 4 mmol/L (ref 3.5–5.1)
Potassium: 4.2 mmol/L (ref 3.5–5.1)
Potassium: 4 mmol/L (ref 3.5–5.1)
Potassium: 4.7 mmol/L (ref 3.5–5.1)
SODIUM: 123 mmol/L — AB (ref 135–145)
SODIUM: 124 mmol/L — AB (ref 135–145)
SODIUM: 127 mmol/L — AB (ref 135–145)
Sodium: 125 mmol/L — ABNORMAL LOW (ref 135–145)

## 2015-11-04 LAB — CBC
HCT: 33.6 % — ABNORMAL LOW (ref 39.0–52.0)
Hemoglobin: 11.4 g/dL — ABNORMAL LOW (ref 13.0–17.0)
MCH: 29.5 pg (ref 26.0–34.0)
MCHC: 33.9 g/dL (ref 30.0–36.0)
MCV: 86.8 fL (ref 78.0–100.0)
Platelets: 216 10*3/uL (ref 150–400)
RBC: 3.87 MIL/uL — ABNORMAL LOW (ref 4.22–5.81)
RDW: 15.8 % — AB (ref 11.5–15.5)
WBC: 8.7 10*3/uL (ref 4.0–10.5)

## 2015-11-04 LAB — C DIFFICILE QUICK SCREEN W PCR REFLEX
C DIFFICILE (CDIFF) INTERP: NOT DETECTED
C Diff antigen: NEGATIVE
C Diff toxin: NEGATIVE

## 2015-11-04 LAB — OSMOLALITY, URINE: OSMOLALITY UR: 384 mosm/kg (ref 300–900)

## 2015-11-04 MED ORDER — VITAMIN B-12 1000 MCG PO TABS
1000.0000 ug | ORAL_TABLET | Freq: Every day | ORAL | Status: DC
  Administered 2015-11-04 – 2015-11-07 (×4): 1000 ug via ORAL
  Filled 2015-11-04 (×4): qty 1

## 2015-11-04 MED ORDER — SACCHAROMYCES BOULARDII 250 MG PO CAPS
250.0000 mg | ORAL_CAPSULE | Freq: Two times a day (BID) | ORAL | Status: DC
  Administered 2015-11-04 – 2015-11-07 (×8): 250 mg via ORAL
  Filled 2015-11-04 (×8): qty 1

## 2015-11-04 NOTE — Discharge Instructions (Signed)

## 2015-11-04 NOTE — Progress Notes (Signed)
PROGRESS NOTE    [name].  [id number] DOB: 10/08/1930 DOA: 11/03/2015 PCP: Binnie Rail, MD   Brief Narrative:  HPI on 11/03/2015 by Dr. Delbert Phenix. is a 82 y.o. male with medical history significant of hypertension, hyperlipidemia, COPD, asthma, CAD, s/p of stent, OSA on CPAP, PAF on Eliquis, CKD-II, dCHF, PVD, who presents with dysuria, diarrhea, hyponatremia.  Patient reports that he has been having diarrhea for almost 3 weeks. He has 5-6 watery bowel movements each day. He has mild dizziness. No nausea, vomiting or abdominal pain. No fever or chills. He also has some mild dysuria, no burning on urination. Patient has mild cough due to asthma and COPD, which is at baseline. No chest pain or SOB. Patient does not have unilateral weakness, hematuria or rash. Pt was seen by his PCP and was found to have "very low sodium" and was seen here for further evaluation and treatment.  Assessment & Plan    Hyponatremia -Sodium upon admission 120  -This is most likely due to diarrhea and continuation of diuretics vs acute on chronic.  After reviewing records, patient has had low sodium in the past. Mental status normal.  -Continue IVF and monitor BMP -TSH 1.764 (10/17/2015)  Diarrhea  -patient has 5 to 6 watery bowel movements each day.  -Etiology is not clear.  -Patient is not taking laxatives  -Cdiff negative -Gastroenterology consulted and appreciated  UTI -patient has dysuria and a positive urinalysis, consistent with UTI. Patient is not septic on admission. Hemodynamically stable. -pending urine and blood cultures -Continue ceftriaxone  PAF -CHA2DS2-VASc 5  -Continue Eliquis -Heart rate controlled, continue Zebeta  Hyperlipidemia -Last LDL was 72 on 2/10 and 2/17  -Continue statin  Coronary atherosclerosis  -s/p of stent 2012 -Currently no chest pain -continue Plavix, Zebeta, pravastatin, Imdur  Asthma with COPD ([place]) -stable, no wheezing,  however, does have cough -Continue Albuterol nebulizer PRN, singulair, Dulera inhaler, prn Mucinex for cough  GERD -Protonix held due to diarrhea -Per wife protonix has not really helped  -continue Pepcid for daily to 20 mg bid  Chronic diastolic heart failure ([place]) -Echocardiogram 09/24/15 showed EF 60-65 percent.  -Currently euvolemic/compensated -Hold torsemide and spironolactone due to hyponatremia -BNP 245.7  Essential hypertension  -continue Irbesartan and zebeta -IV hydralazine when necessary  DVT Prophylaxis  Eliquis  Code Status: Full  Family Communication: Wife at bedside  Disposition Plan: Admitted for observation  Consultants Gastroenterology  Procedures  None  Antibiotics   Anti-infectives    Start     Dose/Rate Route Frequency Ordered Stop   11/03/15 2300  cefTRIAXone (ROCEPHIN) 1 g in dextrose 5 % 50 mL IVPB     1 g 100 mL/hr over 30 Minutes Intravenous Daily at bedtime 11/03/15 2222        Subjective:   [name] seen and examined today.  Complains of diarrhea, and feeling weak. Feels he is always in the hospital.  Denies chest pain, shortness of breath, abdominal pain  Objective:   Vitals:   11/04/15 0425 11/04/15 0843 11/04/15 0905 11/04/15 1131  BP: 138/63 (!) 115/46  132/81  Pulse: 92 87  79  Resp: 17 16  16   Temp: 98 F (36.7 C) 98.4 F (36.9 C)  98.5 F (36.9 C)  TempSrc: Oral Oral  Oral  SpO2: 99% 98% 97% 97%  Weight:      Height:        Intake/Output Summary (Last 24 hours) at 11/04/15  1258 Last data filed at 11/04/15 1123  Gross per 24 hour  Intake           938.25 ml  Output             1051 ml  Net          -112.75 ml   Filed Weights   11/03/15 1912 11/04/15 0022  Weight: 67.6 kg (149 lb) 65.2 kg (143 lb 12.8 oz)    Exam  General: Well developed, well nourished, NAD, appears stated age  HEENT: NCAT, mucous membranes moist.   Cardiovascular: S1 S2 auscultated, no rubs, murmurs or gallops. Regular rate and  rhythm.  Respiratory: Clear to auscultation bilaterally with equal chest rise, occ cough  Abdomen: Soft, nontender, nondistended, + bowel sounds  Extremities: warm dry without cyanosis clubbing or edema  Neuro: AAOx3, nonfocal  Psych: Normal affect and demeanor with intact judgement and insight  Data Reviewed: I have personally reviewed following labs and imaging studies  CBC:  Recent Labs Lab 11/03/15 1420 11/03/15 1926 11/04/15 0827  WBC 11.4* 11.7* 8.7  NEUTROABS 8.7* 8.6*  --   HGB 11.6* 11.3* 11.4*  HCT 33.8* 32.2* 33.6*  MCV 88.0 86.1 86.8  PLT 259.0 220 [id number]   Basic Metabolic Panel:  Recent Labs Lab 11/03/15 1420 11/03/15 1926 11/04/15 0237 11/04/15 0827  NA 119* 120* 123* 124*  K 4.5 4.5 4.0 4.2  CL 85* 87* 91* 93*  CO2 29 24 24 25   GLUCOSE 101* 102* 144* 129*  BUN 17 18 14 11   CREATININE 1.06 1.00 1.04 0.86  CALCIUM 9.0 9.0 8.2* 8.5*   GFR: Estimated Creatinine Clearance: 51.1 mL/min (by C-G formula based on SCr of 0.86 mg/dL). Liver Function Tests:  Recent Labs Lab 11/03/15 1420 11/03/15 1926  AST 15 18  ALT 14 17  ALKPHOS 49 53  BILITOT 0.5 0.5  PROT 6.6 6.3*  ALBUMIN 3.7 3.2*   No results for input(s): LIPASE, AMYLASE in the last 168 hours. No results for input(s): AMMONIA in the last 168 hours. Coagulation Profile: No results for input(s): INR, PROTIME in the last 168 hours. Cardiac Enzymes: No results for input(s): CKTOTAL, CKMB, CKMBINDEX, TROPONINI in the last 168 hours. BNP (last 3 results) No results for input(s): PROBNP in the last 8760 hours. HbA1C: No results for input(s): HGBA1C in the last 72 hours. CBG: No results for input(s): GLUCAP in the last 168 hours. Lipid Profile: No results for input(s): CHOL, HDL, LDLCALC, TRIG, CHOLHDL, LDLDIRECT in the last 72 hours. Thyroid Function Tests: No results for input(s): TSH, T4TOTAL, FREET4, T3FREE, THYROIDAB in the last 72 hours. Anemia Panel: No results for input(s):  VITAMINB12, FOLATE, FERRITIN, TIBC, IRON, RETICCTPCT in the last 72 hours. Urine analysis:    Component Value Date/Time   COLORURINE YELLOW 11/03/2015 1934   APPEARANCEUR CLEAR 11/03/2015 1934   LABSPEC <1.005 (L) 11/03/2015 1934   PHURINE 7.0 11/03/2015 1934   GLUCOSEU NEGATIVE 11/03/2015 1934   GLUCOSEU NEGATIVE 06/19/2014 0941   HGBUR TRACE (A) 11/03/2015 1934   BILIRUBINUR NEGATIVE 11/03/2015 1934   BILIRUBINUR negative 09/20/2015 1946   KETONESUR NEGATIVE 11/03/2015 1934   PROTEINUR NEGATIVE 11/03/2015 1934   UROBILINOGEN 0.2 09/20/2015 1946   UROBILINOGEN 0.2 09/17/2014 2215   NITRITE POSITIVE (A) 11/03/2015 1934   LEUKOCYTESUR MODERATE (A) 11/03/2015 1934   Sepsis Labs: @LABRCNTIP (procalcitonin:4,lacticidven:4)  ) Recent Results (from the past 240 hour(s))  C difficile quick scan w PCR reflex     Status: None  Collection Time: 11/04/15  2:07 AM  Result Value Ref Range Status   C Diff antigen NEGATIVE NEGATIVE Final   C Diff toxin NEGATIVE NEGATIVE Final   C Diff interpretation No C. difficile detected.  Final      Radiology Studies: No results found.   Scheduled Meds: . apixaban  5 mg Oral BID  . bisoprolol  20 mg Oral Daily  . calcium-vitamin D  1 tablet Oral BID WC  . cefTRIAXone (ROCEPHIN)  IV  1 g Intravenous QHS  . cholestyramine  8 g Oral QPC lunch  . clopidogrel  75 mg Oral Q breakfast  . famotidine  20 mg Oral BID  . irbesartan  300 mg Oral Daily  . isosorbide mononitrate  60 mg Oral BID  . latanoprost  1 drop Both Eyes QHS  . LORazepam  0.5 mg Oral BID  . mometasone-formoterol  2 puff Inhalation BID  . montelukast  10 mg Oral QHS  . pravastatin  80 mg Oral q1800  . saccharomyces boulardii  250 mg Oral BID  . sodium chloride flush  3 mL Intravenous Q12H  . vitamin B-12  1,000 mcg Oral Daily   Continuous Infusions: . sodium chloride 75 mL/hr at 11/04/15 0051     LOS: 0 days   Time Spent in minutes   30 minutes  Yifan Auker D.O. on  11/04/2015 at 12:58 PM  Between 7am to 7pm - Pager - [phone]  After 7pm go to [email or web address] - password [email or web address]  And look for the night coverage person covering for me after hours  [facility] Office  [phone]

## 2015-11-04 NOTE — Consult Note (Signed)
[facility] Consult: 2:24 PM 11/04/2015  LOS: 0 days    Referring Provider: Dr Ree Kida  Primary Care Physician:  Binnie Rail, MD Primary Gastroenterologist:  Dr. Henrene Pastor   Reason for Consultation:  Diarrhea.     HPI: [name]. is a 82 y.o. male.  Multiple chronic and acute medical issues.  CAD. CABG 2000.  Latest cath 11/2014: severe multivessel CAD, patent LIMA-LAD and SVG-OM grafts, subtotal occlusion SVG-PLA and chronic occlusion SVG-diagonal.  S/p PCI of RCA, DES stent placed.  LVEF 60 to 65%, elevated end-diastolic pressures by 2 D echo 09/2015.    Chronic Eliquis and Plavix (BID Protonix). PVD.  Left subclavian stenosis, s/p PTA and stent 02/2015.  Htn, hyperlipidemia.  CKD-2.  OSA on CPAP.  Asthma.  Emphysema.  Hx ETOH abuse.  Spinal spondylosis.  02/2011 gallstone pancreatitis and lap chole.  Squamous cell skin cancer.  Hyponatremia dating back to 03/2015.  Remote hx ETOH abuse, quit 42 yrs ago. Polypharmacy.    10/2007 EGD. For dark, FOBT + stool.  Found HH, partially constricting distal esophageal stricture, was not dilated.   10/2007 Colonoscopy for FOBT +.  Sigmoid diverticulosis  02/2011 abdominal ultrasound for abd pain, n/v: gallstones, 5 mm CBD, multiple cysts in liver.  01/2014 Esophagram.  For recurrent PNA. Study positive for GERD into lower 1/3rd of esophagus.  Mild dysmotility and patulous esophagus.  As of GI office follow up 12/2013 (for possible aspiration and recent PNA), was felt too high risk for endoscopic procedures.    Admission 09/22/2015 (5 days) for COPD flare, labile blood pressures, PAF.   Treated with steroid burst and Doxycycline. At [facility] 7/6 was c/o diarrhea, > 10 stools per day, treated empirically with po Flagyl and improved briefly but then returned along with weakness even  with flagyl still in place.  Na low of 131.  Admission 10/17/15 (5 days) for bradycardia, weakness.   + AKI.  Treated with Dopamine drip.  Loletha Grayer attributed to electrolyte derangement, not adrenal insufficiency.  Diltiazem was discontinued.  Never had diarrhea while inpt and had no BM until HD # 4: solid, formed. C diff was negative and Flagyl discontinued (per ID).  Protonix also discontinued as possible source of diarrhea, substituted with Ranitindine.  Na low of 119, up to 132 at discharge.       .   Admission 11/03/15.   Na low at 119 (same as on 10/17/15), now up to 124.  Stable renal function (improved c/w previous admission), GFR > 60.  Potassiums are normal.     CT angio negative for PE.    Diarrhea started after 6/18 discharge.  Consists of 5 to 6 BMs in AM, 1 or 2 during afternoon/evening and sometimes at night.  Short warning as to need to defecate resulting in fecal incontinence for which he has been wearing Depends. No rectal or abdominal pain or bloody stool.  Though stools are often loose or watery, he also passes formed balls of stool.  Overall volume of stool is not large. No nausea or  anorexia.  Weight is stable.  c/o+ pyrosis and reflux which triggers a frothy/white cough and his asthma.  Sleep with torso elevated by a foam wedge.  No dysphagia.  Has not noticed increased frequency of GERD sxs with switch from Protonix to Famotidine.  Treats breakthrough GERD with Rolaids.   In 2012-2013 several GI OVs with Dr Henrene Pastor for diarrhea and fecal incontinence, presumed secondary to bile salts.  Responded well to Questran and also improved temporarily with course of Metronidazole (C diff and multiple stool studies were negative).  There was ? Of SB bacterial overgrowth.  At some point pt was told his anal sphincter was weak and this was part of the reason for incontinence (though do not see this documented in DR Perry's notes).   Current troubles with diarrhea only present since late June 2017.   Questran previously effectively controlled sxs but not effective of late even with his increasing the dose to BID.   Immodium also at best checks sxs for a few hours.       Past Medical History:  Diagnosis Date  . Arthritis   . ASTHMA   . Benign liver cyst   . CAD (coronary artery disease)    a. s/p CABG;  b. cath 06/27/10: S-Dx occluded, S-PDA 80-90% (tx with PCI); S-OM ok, L-LAD ok;  EF 65-70%  c.  s/p Promus DES to S-PDA 06/2010;   d. Cath 8 2016 LIMA to the LAD patent, SVG to posterior lateral subtotal, SVG to diagonal occluded chronically. The patient had stenting of the native right vessel.  . Cataract    surgery to both eyes  . Chronic diastolic CHF (congestive heart failure), NYHA class 2 ([place])   . CKD (chronic kidney disease), stage II   . COPD (chronic obstructive pulmonary disease) ([place])   . Depression with anxiety   . Diverticulosis   . DYSLIPIDEMIA   . Esophageal stricture   . Essential hypertension    Echo 3/12: EF 55-60%; mod LVH; mild AS/AI; LAE; PASP 38; mild pulmo HTN  . GERD    with HH, hx esophageal stricture  . Glaucoma   . Hiatal hernia   . Osteoporosis   . PAF (paroxysmal atrial fibrillation) ([place])   . Personal history of alcoholism ([place])    quit etoh and abstinent since age 36.   Marland Kitchen Pneumonia 04/2013 hosp  . Skin cancer    L forearm  . SLEEP APNEA 09/2001   NPSG AHI 22/HR  . Subclavian arterial stenosis ([facility])    a. s/p PTCA of subclavian 2016.    Past Surgical History:  Procedure Laterality Date  . CARDIAC CATHETERIZATION N/A 11/26/2014   Procedure: Left Heart Cath and Cors/Grafts Angiography;  Surgeon: Sherren Mocha, MD;  Location: [place] [facility];  Service: Cardiovascular;  Laterality: N/A;  . CARDIAC CATHETERIZATION N/A 11/26/2014   Procedure: Coronary Stent Intervention;  Surgeon: Sherren Mocha, MD;  Location: [place] [facility];  Service: Cardiovascular;  Laterality: N/A;  . CATARACT EXTRACTION, BILATERAL  2007  . CHOLECYSTECTOMY  02/21/2011     Procedure: LAPAROSCOPIC CHOLECYSTECTOMY WITH INTRAOPERATIVE CHOLANGIOGRAM;  Surgeon: Pedro Earls, MD;  Location: [facility];  Service: General;  Laterality: N/A;  . CORONARY ANGIOPLASTY WITH STENT PLACEMENT  07/2010  . CORONARY ARTERY BYPASS GRAFT  02/2007  . HERNIA REPAIR  unsure ?60's  . PERIPHERAL VASCULAR CATHETERIZATION N/A 02/22/2015   Procedure: Upper Extremity Angiography;  Surgeon: Lorretta Harp, MD;  Location: [place] [facility];  Service:  Cardiovascular;  Laterality: N/A;  . TONSILLECTOMY      Prior to Admission medications   Medication Sig Start Date End Date Taking? Authorizing Provider  acetaminophen (TYLENOL) 325 MG tablet Take 650 mg by mouth every 6 (six) hours as needed for moderate pain or headache.    Yes Historical Provider, MD  albuterol (PROAIR HFA) 108 (90 Base) MCG/ACT inhaler Inhale 2 puffs into the lungs every 6 (six) hours as needed for wheezing or shortness of breath. 08/16/15  Yes Deneise Lever, MD  apixaban (ELIQUIS) 5 MG TABS tablet Take 1 tablet (5 mg total) by mouth 2 (two) times daily. 05/20/15  Yes Minus Breeding, MD  bisoprolol (ZEBETA) 10 MG tablet Take 2 tablets (20 mg total) by mouth daily. 11/03/15  Yes Binnie Rail, MD  calcium-vitamin D (OSCAL WITH D) 500-200 MG-UNIT per tablet Take 1 tablet by mouth 2 (two) times daily.   Yes Historical Provider, MD  cholestyramine (QUESTRAN) 4 g packet MIX AND DRINK 1 PACKET BY MOUTH TWICE A DAY IF NEEDED FOR LOOSE STOOLS Patient taking differently: Take 8 g by mouth daily after lunch. 2 packets 07/13/15  Yes Irene Shipper, MD  clopidogrel (PLAVIX) 75 MG tablet Take 1 tablet (75 mg total) by mouth daily with breakfast. 09/27/15  Yes Minus Breeding, MD         famotidine (PEPCID) 20 MG tablet Take 1 tablet (20 mg total) by mouth daily. 10/21/15  Yes Dayna N Dunn, PA-C  ipratropium-albuterol (DUONEB) 0.5-2.5 (3) MG/3ML SOLN USE ONE VIAL VIA NEBULIZER EVERY 6 HOURS AS NEEDED FOR SHORTNESS OF BREATH Patient taking  differently: USE ONE VIAL VIA NEBULIZER THREE TIMES DAILY 12/17/14  Yes Deneise Lever, MD  isosorbide mononitrate (IMDUR) 60 MG 24 hr tablet Take 1 tablet (60 mg total) by mouth 2 (two) times daily. 05/19/15  Yes Shanker Kristeen Mans, MD  latanoprost (XALATAN) 0.005 % ophthalmic solution Place 1 drop into both eyes at bedtime.    Yes Historical Provider, MD  LORazepam (ATIVAN) 0.5 MG tablet Take 0.5 mg by mouth 2 (two) times daily.   Yes Historical Provider, MD  mometasone-formoterol (DULERA) 100-5 MCG/ACT AERO Inhale 2 puffs into the lungs 2 (two) times daily. Rinse mouth after use   Yes Historical Provider, MD  montelukast (SINGULAIR) 10 MG tablet Take 1 tablet (10 mg total) by mouth at bedtime. 11/11/14  Yes Rowe Clack, MD  nitroGLYCERIN (NITROSTAT) 0.4 MG SL tablet Place 1 tablet (0.4 mg total) under the tongue every 5 (five) minutes as needed for chest pain. 07/13/14  Yes Minus Breeding, MD  pravastatin (PRAVACHOL) 80 MG tablet Take 1 tablet (80 mg total) by mouth every morning. Patient taking differently: Take 80 mg by mouth daily.  07/29/15  Yes Minus Breeding, MD  Probiotic Product (PROBIOTIC DAILY) CAPS Take 1 capsule by mouth 2 (two) times daily.    Yes Historical Provider, MD  RA VITAMIN B-12 TR 1000 MCG TBCR take 1 tablet by mouth once daily 01/23/14  Yes Rowe Clack, MD  spironolactone (ALDACTONE) 25 MG tablet Take 1 tablet (25 mg total) by mouth daily. 10/21/15  Yes Dayna N Dunn, PA-C  torsemide (DEMADEX) 20 MG tablet Take 1 tablet (20 mg total) by mouth daily. 10/21/15  Yes Dayna N Dunn, PA-C  traZODone (DESYREL) 50 MG tablet Take 1 tablet (50 mg total) by mouth at bedtime as needed for sleep. 11/03/15  Yes Binnie Rail, MD  valsartan (DIOVAN) 320 MG tablet Take  1 tablet (320 mg total) by mouth daily. 08/10/15  Yes Binnie Rail, MD  PRESCRIPTION MEDICATION Inhale into the lungs at bedtime. CPAP/ 12    Historical Provider, MD  PRESCRIPTION MEDICATION Inject 1 each into the skin once a  week. Allergy shots from Dr. Annamaria Boots (1:10) - self-administered - usually Thursday or Friday    Historical Provider, MD    Scheduled Meds: . apixaban  5 mg Oral BID  . bisoprolol  20 mg Oral Daily  . calcium-vitamin D  1 tablet Oral BID WC  . cefTRIAXone (ROCEPHIN)  IV  1 g Intravenous QHS  . cholestyramine  8 g Oral QPC lunch  . clopidogrel  75 mg Oral Q breakfast  . famotidine  20 mg Oral BID  . irbesartan  300 mg Oral Daily  . isosorbide mononitrate  60 mg Oral BID  . latanoprost  1 drop Both Eyes QHS  . LORazepam  0.5 mg Oral BID  . mometasone-formoterol  2 puff Inhalation BID  . montelukast  10 mg Oral QHS  . pravastatin  80 mg Oral q1800  . saccharomyces boulardii  250 mg Oral BID  . sodium chloride flush  3 mL Intravenous Q12H  . vitamin B-12  1,000 mcg Oral Daily   Infusions: . sodium chloride 75 mL/hr at 11/04/15 0051   PRN Meds: acetaminophen, albuterol, dextromethorphan-guaiFENesin, hydrALAZINE, nitroGLYCERIN, ondansetron **OR** ondansetron (ZOFRAN) IV, traZODone   Allergies as of 11/03/2015 - Review Complete 11/03/2015  Allergen Reaction Noted  . Ace inhibitors Other (See Comments) 05/19/2013  . Avelox [moxifloxacin hcl in nacl] Other (See Comments) 05/05/2015  . Tape Other (See Comments) 05/17/2015  . Lidocaine Other (See Comments) 05/17/2015  . Morphine and related Nausea And Vomiting 02/23/2015  . Codeine Nausea And Vomiting   . Lasix [furosemide] Itching and Swelling 03/28/2013  . Other Other (See Comments) 07/19/2014    Family History  Problem Relation Age of Onset  . COPD Sister   . Asthma Sister   . Emphysema Sister   . Hypertension Sister   . Hyperlipidemia Sister   . Hypertension Mother   . Heart disease Mother   . Hyperlipidemia Mother   . Heart disease Father   . Hyperlipidemia Father   . Hypertension Father   . Heart disease Brother   . Colon cancer Neg Hx     Social History   Social History  . Marital status: Married    Spouse name:  N/A  . Number of children: 2  . Years of education: N/A   Occupational History  . retired Retired    17 years   Social History Main Topics  . Smoking status: Former Smoker    Packs/day: 3.00    Years: 40.00    Types: Cigarettes    Quit date: 04/11/1979  . Smokeless tobacco: Never Used  . Alcohol use No     Comment: quit drinking 40+ years  . Drug use: No  . Sexual activity: Yes   Other Topics Concern  . 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 Married lives with wife 2 children   Served 3 yrs in [employer], enterred service age 89, deployed to [place] age 74.      REVIEW OF SYSTEMS: Constitutional:  + weakness,  Has felt poorly since hospital stay # 2 in 10/2015 ENT:  No nose bleeds Pulm:  Per HPI.  No increased SOB of late.  Sleep with head elevated to  prevent cough.   CV:  No palpitations, no LE edema.  GU:  No hematuria, no frequency GI:  Per HPI Heme:  Bleeds easily but not excessively.  Bruises easily   Transfusions:  None per his recall.   Neuro:  No headaches, no peripheral tingling or numbness.  Previous issues with dbl vision have reslved Derm:  No itching, no rash or sores.  Endocrine:  No sweats or chills.  No polyuria or dysuria Immunization:  Not queried.  Reviewed.  Travel:  None beyond local counties in last few months.    PHYSICAL EXAM: Vital signs in last 24 hours: Vitals:   11/04/15 0843 11/04/15 1131  BP: (!) 115/46 132/81  Pulse: 87 79  Resp: 16 16  Temp: 98.4 F (36.9 C) 98.5 F (36.9 C)   Wt Readings from Last 3 Encounters:  11/04/15 65.2 kg (143 lb 12.8 oz)  11/03/15 67.1 kg (148 lb)  10/26/15 65.3 kg (144 lb)    General: pleasant, somewhat frail looking but expected him to look worse than he does. comfortable Head:  No asymmetry, trauma or swelling  Eyes:  No icterus.  + conj pallor.  EOMI.  Lens of OD bifocal is covered with fine geometric grid (for dbl vision) Ears:  HOH.  Nose:  No congestion or  discharge Mouth:  Edentulous, only upper denture in place.  Moist and clear oral MM.  Tongue midline.  Neck:  No mass or JVD.  No TMG Lungs:  Clear bil. + cough and slightly wet vocal quality Heart: RRR.  No mrg.  S1/S2 present. Abdomen:  Soft, NT, ND.  BS active.  No hernia, masses, bruits or HSM.   Rectal: external sphincter tone is slightly reduced but by no means absent.  Scant stool in empty vault is FOBT negative Musc/Skeltl: no joint redness, swelling or contracture.  Arthritic changes in hands Extremities:  No CCE  Neurologic:  Oriented x 3.  Good historian.  Fully alert.  Skin:  No telangectasia.  + UE purpura. No open sores or rashes.  Tattoos:   Faded/blurred tatoos on both forearms (age 27 or so while in [employer])    Psych:  Calm, pleasant, relaxed.   Intake/Output from previous day: 07/26 0701 - 07/27 0700 In: 698.3 [P.O.:462; I.V.:236.3] Out: 200 [Urine:200] Intake/Output this shift: Total I/O In: 240 [P.O.:240] Out: 851 [Urine:850; Stool:1]  LAB RESULTS:  Recent Labs  11/03/15 1420 11/03/15 1926 11/04/15 0827  WBC 11.4* 11.7* 8.7  HGB 11.6* 11.3* 11.4*  HCT 33.8* 32.2* 33.6*  PLT 259.0 220 216   BMET Lab Results  Component Value Date   NA 124 (L) 11/04/2015   NA 123 (L) 11/04/2015   NA 120 (L) 11/03/2015   K 4.2 11/04/2015   K 4.0 11/04/2015   K 4.5 11/03/2015   CL 93 (L) 11/04/2015   CL 91 (L) 11/04/2015   CL 87 (L) 11/03/2015   CO2 25 11/04/2015   CO2 24 11/04/2015   CO2 24 11/03/2015   GLUCOSE 129 (H) 11/04/2015   GLUCOSE 144 (H) 11/04/2015   GLUCOSE 102 (H) 11/03/2015   BUN 11 11/04/2015   BUN 14 11/04/2015   BUN 18 11/03/2015   CREATININE 0.86 11/04/2015   CREATININE 1.04 11/04/2015   CREATININE 1.00 11/03/2015   CALCIUM 8.5 (L) 11/04/2015   CALCIUM 8.2 (L) 11/04/2015   CALCIUM 9.0 11/03/2015   LFT  Recent Labs  11/03/15 1420 11/03/15 1926  PROT 6.6 6.3*  ALBUMIN 3.7 3.2*  AST 15 18  ALT 14 17  ALKPHOS 49 53  BILITOT  0.5 0.5   PT/INR Lab Results  Component Value Date   INR 1.11 05/17/2015   INR 1.20 02/15/2015   INR 1.19 05/04/2013   Hepatitis Panel No results for input(s): HEPBSAG, HCVAB, HEPAIGM, HEPBIGM in the last 72 hours. C-Diff No components found for: CDIFF Lipase     Component Value Date/Time   LIPASE 44 10/17/2015 1740    RADIOLOGY STUDIES: No results found.  ENDOSCOPIC STUDIES: Per HPI  IMPRESSION:   *  Diarrhea, actually sounds more due to fecal incontinence than actual large volume diarrhea.  Issue dates back to several years ago.  Dr Henrene Pastor attributed it to bile salts.  C diff negative.   Do not think this is infectious.  ? Microscopic colitis.   *  Hyponatremia. ? If this is entirely due to excessive stools?   Pt has remote hx of etoh abuse. Only cysts found on liver ultrasound in 02/2011.  Coags normal in 05/2015.  So low likelihood cirrhosis could be causing hyponatremia.      *  ? UTI.  U/A with + nitrite, moderate leukocytes, 6 to 30 WBCs. Rocephin in place. Clx in process. Pt not having dysuria.    *  GERD.  Sxs stable off PPI and on H2 blocker.  While on PPI had, and continues to have, frequent reflux and pyrosis.  Reflux felt to be trigger for cough and asthma.  Stopping PPI also has not significantly improved the "diarrhea".   *  CAD.  Previous CABG and subsequent stenting to graft. Diastolic heart failure. On Eliquis and Plavix. No chest pain.     PLAN:     *  ? Flex sig or colonoscopy with biopsy to r/o microscopic colitis?   *  Check stool osmolality, fecal fat?    Azucena Freed  11/04/2015, 2:24 PM Pager: [phone]

## 2015-11-04 NOTE — Evaluation (Signed)
Physical Therapy Evaluation Patient Details Name: [name]. MRN: [id number] DOB: 1930/10/18 Today's Date: 11/04/2015   History of Present Illness  82 y.o. male was admitted with diarrhea leading to hyponatremia and generalized weakness.  Home with wife and was noted to have UTI diagnosed recently.  PMHx:  CAD, CABG, OSA, HTN, PVD, Subclavian stenosis, hyponatremia, tachycardia.   Clinical Impression  Pt is demonstrating some good effort to walk and work on balance, and will need likely a [facility] which he owns.  He is sensitive on feet and may benefit from HHPT before returning to outpatient therapy.  Will persist acutely with gait and balance work and ROM to LE's to increase control of both.    Follow Up Recommendations Home health PT;Other (comment) (then back to outpatient as before admission)    Equipment Recommendations  Kasandra Knudsen (pt has one at home)    Recommendations for Other Services Rehab consult     Precautions / Restrictions Precautions Precautions: Fall (telemetry) Restrictions Weight Bearing Restrictions: No      Mobility  Bed Mobility Overal bed mobility: Needs Assistance Bed Mobility: Supine to Sit     Supine to sit: Min guard;Min assist     General bed mobility comments: minor help to slide to bedside  Transfers Overall transfer level: Needs assistance Equipment used: 1 person hand held assist (IV pole to ambulate) Transfers: Sit to/from [facility] Sit to Stand: Min guard Stand pivot transfers: Min guard          Ambulation/Gait Ambulation/Gait assistance: Min assist;Min guard Ambulation Distance (Feet): 100 Feet Assistive device: 1 person hand held assist (IV pole) Gait Pattern/deviations: Step-through pattern;Shuffle;Wide base of support;Trunk flexed Gait velocity: reduced Gait velocity interpretation: Below normal speed for age/gender General Gait Details: has stiffness in LE's contributing to difficulty  walking  Stairs Stairs: Yes Stairs assistance: Min guard Stair Management: One rail Right;Alternating pattern;Forwards (slower in controlling steps down ) Number of Stairs: 4 General stair comments: Pt is ambitious and went up more than were needed for his evaluation  Wheelchair Mobility    Modified Rankin (Stroke Patients Only)       Balance Overall balance assessment: Needs assistance Sitting-balance support: Feet supported Sitting balance-Leahy Scale: Fair   Postural control: Posterior lean Standing balance support: Bilateral upper extremity supported Standing balance-Leahy Scale: Poor Standing balance comment: IV pole is good support for controlling standing balance                             Pertinent Vitals/Pain Pain Assessment: No/denies pain    Home Living Family/patient expects to be discharged to:: Private residence Living Arrangements: Spouse/significant other Available Help at Discharge: Family;Available 24 hours/day Type of Home: House Home Access: Stairs to enter Entrance Stairs-Rails: Right Entrance Stairs-Number of Steps: 2 Home Layout: One level Home Equipment: Cane - single point;Walker - 2 wheels (belong to wife)      Prior Function Level of Independence: Independent               Hand Dominance   Dominant Hand: Right    Extremity/Trunk Assessment   Upper Extremity Assessment: Overall WFL for tasks assessed           Lower Extremity Assessment: Generalized weakness      Cervical / Trunk Assessment: Normal  Communication   Communication: HOH  Cognition Arousal/Alertness: Awake/alert Behavior During Therapy: WFL for tasks assessed/performed Overall Cognitive Status: Within Functional Limits for tasks  assessed                      General Comments General comments (skin integrity, edema, etc.): pt is up to walk and into BR with PT assisting his efforts, assisted to stand and manage at sink, wife in  attendance to ask questions and help with care decisions    Exercises        Assessment/Plan    PT Assessment Patient needs continued PT services  PT Diagnosis Generalized weakness;Difficulty walking   PT Problem List Decreased strength;Decreased range of motion;Decreased activity tolerance;Decreased balance;Decreased mobility;Decreased coordination;Decreased knowledge of use of DME;Decreased safety awareness;Cardiopulmonary status limiting activity;Decreased skin integrity  PT Treatment Interventions Gait training;DME instruction;Stair training;Functional mobility training;Therapeutic activities;Therapeutic exercise;Balance training;Neuromuscular re-education;Patient/family education   PT Goals (Current goals can be found in the Care Plan section) Acute Rehab PT Goals Patient Stated Goal: to feel better and get home PT Goal Formulation: With patient/family Time For Goal Achievement: 11/11/15 Potential to Achieve Goals: Good    Frequency Min 3X/week   Barriers to discharge   wife can assist and pt can climb steps    Co-evaluation               End of Session Equipment Utilized During Treatment: Gait belt Activity Tolerance: Patient tolerated treatment well;Patient limited by fatigue;Patient limited by lethargy Patient left: in chair;with call bell/phone within reach;with family/visitor present;with chair alarm set Nurse Communication: Mobility status    Functional Assessment Tool Used: clinical judgment Functional Limitation: Mobility: Walking and moving around Mobility: Walking and Moving Around Current Status [id number]): At least 20 percent but less than 40 percent impaired, limited or restricted Mobility: Walking and Moving Around Goal Status [phone]): At least 1 percent but less than 20 percent impaired, limited or restricted    Time: 0856-0927 PT Time Calculation (min) (ACUTE ONLY): 31 min   Charges:   PT Evaluation $PT Eval Moderate Complexity: 1 Procedure PT  Treatments $Gait Training: 8-22 mins   PT G Codes:   PT G-Codes **NOT FOR INPATIENT CLASS** Functional Assessment Tool Used: clinical judgment Functional Limitation: Mobility: Walking and moving around Mobility: Walking and Moving Around Current Status [id number]): At least 20 percent but less than 40 percent impaired, limited or restricted Mobility: Walking and Moving Around Goal Status [phone]): At least 1 percent but less than 20 percent impaired, limited or restricted    Ramond Dial 11/04/2015, 11:35 AM    Mee Hives, PT MS Acute Rehab Dept. Number: [employer]

## 2015-11-05 ENCOUNTER — Encounter (HOSPITAL_COMMUNITY)
Admission: EM | Disposition: A | Discharge status: Home / Self Care | Payer: Self-pay | Source: Home / Self Care | Attending: Emergency Medicine

## 2015-11-05 ENCOUNTER — Ambulatory Visit: Payer: Medicare Other | Admitting: Internal Medicine

## 2015-11-05 ENCOUNTER — Encounter (HOSPITAL_COMMUNITY): Payer: Self-pay

## 2015-11-05 ENCOUNTER — Observation Stay (HOSPITAL_COMMUNITY): Payer: Medicare Other

## 2015-11-05 ENCOUNTER — Other Ambulatory Visit: Payer: Self-pay

## 2015-11-05 DIAGNOSIS — I5032 Chronic diastolic (congestive) heart failure: Secondary | ICD-10-CM | POA: Diagnosis not present

## 2015-11-05 DIAGNOSIS — R197 Diarrhea, unspecified: Secondary | ICD-10-CM | POA: Diagnosis not present

## 2015-11-05 DIAGNOSIS — K5730 Diverticulosis of large intestine without perforation or abscess without bleeding: Secondary | ICD-10-CM | POA: Diagnosis not present

## 2015-11-05 DIAGNOSIS — D509 Iron deficiency anemia, unspecified: Secondary | ICD-10-CM | POA: Diagnosis not present

## 2015-11-05 DIAGNOSIS — K529 Noninfective gastroenteritis and colitis, unspecified: Secondary | ICD-10-CM

## 2015-11-05 DIAGNOSIS — J449 Chronic obstructive pulmonary disease, unspecified: Secondary | ICD-10-CM | POA: Diagnosis not present

## 2015-11-05 DIAGNOSIS — E871 Hypo-osmolality and hyponatremia: Secondary | ICD-10-CM | POA: Diagnosis not present

## 2015-11-05 HISTORY — PX: FLEXIBLE SIGMOIDOSCOPY: SHX5431

## 2015-11-05 LAB — CBC
HEMATOCRIT: 30.4 % — AB (ref 39.0–52.0)
HEMOGLOBIN: 10.4 g/dL — AB (ref 13.0–17.0)
MCH: 29.7 pg (ref 26.0–34.0)
MCHC: 34.2 g/dL (ref 30.0–36.0)
MCV: 86.9 fL (ref 78.0–100.0)
Platelets: 203 10*3/uL (ref 150–400)
RBC: 3.5 MIL/uL — ABNORMAL LOW (ref 4.22–5.81)
RDW: 16.1 % — ABNORMAL HIGH (ref 11.5–15.5)
WBC: 7.3 10*3/uL (ref 4.0–10.5)

## 2015-11-05 LAB — BASIC METABOLIC PANEL
Anion gap: 7 (ref 5–15)
BUN: 8 mg/dL (ref 6–20)
CHLORIDE: 97 mmol/L — AB (ref 101–111)
CO2: 24 mmol/L (ref 22–32)
CREATININE: 0.78 mg/dL (ref 0.61–1.24)
Calcium: 8.8 mg/dL — ABNORMAL LOW (ref 8.9–10.3)
GFR calc Af Amer: >60 mL/min (ref >60)
GFR calc non Af Amer: >60 mL/min (ref >60)
Glucose, Bld: 100 mg/dL — ABNORMAL HIGH (ref 65–99)
Potassium: 4.4 mmol/L (ref 3.5–5.1)
Sodium: 128 mmol/L — ABNORMAL LOW (ref 135–145)

## 2015-11-05 SURGERY — SIGMOIDOSCOPY, FLEXIBLE
Anesthesia: Moderate Sedation

## 2015-11-05 MED ORDER — IOPAMIDOL (ISOVUE-300) INJECTION 61%
INTRAVENOUS | Status: AC
  Administered 2015-11-05: 100 mL
  Filled 2015-11-05: qty 100

## 2015-11-05 MED ORDER — FENTANYL CITRATE (PF) 100 MCG/2ML IJ SOLN
INTRAMUSCULAR | Status: DC | PRN
  Administered 2015-11-05 (×2): 25 ug via INTRAVENOUS

## 2015-11-05 MED ORDER — FENTANYL CITRATE (PF) 100 MCG/2ML IJ SOLN
INTRAMUSCULAR | Status: AC
  Filled 2015-11-05: qty 2

## 2015-11-05 MED ORDER — DIPHENOXYLATE-ATROPINE 2.5-0.025 MG PO TABS
1.0000 | ORAL_TABLET | Freq: Four times a day (QID) | ORAL | Status: DC
  Administered 2015-11-05 – 2015-11-06 (×5): 1 via ORAL
  Filled 2015-11-05 (×6): qty 1

## 2015-11-05 MED ORDER — SODIUM CHLORIDE 0.9 % IV SOLN: INTRAVENOUS | Status: DC

## 2015-11-05 MED ORDER — DIPHENOXYLATE-ATROPINE 2.5-0.025 MG PO TABS: 1.0000 | ORAL_TABLET | Freq: Four times a day (QID) | ORAL | Status: DC | PRN

## 2015-11-05 MED ORDER — MIDAZOLAM HCL 10 MG/2ML IJ SOLN
INTRAMUSCULAR | Status: DC | PRN
  Administered 2015-11-05: 1 mg via INTRAVENOUS
  Administered 2015-11-05: 2 mg via INTRAVENOUS
  Administered 2015-11-05: 1 mg via INTRAVENOUS

## 2015-11-05 MED ORDER — MIDAZOLAM HCL 5 MG/ML IJ SOLN
INTRAMUSCULAR | Status: AC
  Filled 2015-11-05: qty 2

## 2015-11-05 NOTE — Progress Notes (Signed)
Pt has ROV with GI, Dr Henrene Pastor, on 12/20/15 at 2:45.  Info entered under Provider navigator discharge outline.

## 2015-11-05 NOTE — Progress Notes (Signed)
Currently refused bed alarm. Will continue to monitor patient.

## 2015-11-05 NOTE — Consult Note (Signed)
   [facility] CM Inpatient Consult   11/05/2015  [name]. 1930-10-12 [id number]  Patient evaluated for community based chronic disease management services with [facility] as a benefit of patient's [employer].  Chart review reveals the patient is a 82 y.o.malewith medical history significant of hypertension, hyperlipidemia, COPD, asthma, CAD, s/p of stent, OSA on CPAP, PAF on Eliquis, CKD-II, HF, PVD, who presents with dysuria, diarrhea, hyponatremia.  Spoke with patient, and daughter at bedside to explain [place] Management services. Consent form signed.  Patient will receive post hospital discharge call and will be evaluated for monthly home visits for assessments and disease process education.  Left contact information and [facility] literature at bedside. Made Inpatient Case Manager aware that [place] Management following. Of note, [facility] Care Management services does not replace or interfere with any services that are arranged by inpatient case management or social work.  For additional questions or referrals please contact:    Natividad Brood, RN BSN [facility] Liaison  [phone] business mobile phone Toll free office [phone]

## 2015-11-05 NOTE — Care Management Obs Status (Signed)
[facility] NOTIFICATION   Patient Details  Name: [name]. MRN: [id number] Date of Birth: 12-04-1930   Medicare Observation Status Notification Given:  Yes    Royston Bake, RN 11/05/2015, 3:59 PM

## 2015-11-05 NOTE — Progress Notes (Signed)
Physical Therapy Treatment Patient Details Name: [name]. MRN: [id number] DOB: [birth year] Today's Date: 11/05/2015    History of Present Illness 82 y.o. male was admitted with diarrhea leading to hyponatremia and generalized weakness.  Home with wife and was noted to have UTI diagnosed recently.  PMHx:  CAD, CABG, OSA, HTN, PVD, Subclavian stenosis, hyponatremia, tachycardia.     PT Comments    Pt is willing to work today, has plan to get home with his family and is demonstrating good effort toward this end goal.  He mainly has low endurance, but is definitely improved with all activity today.  Will follow acutely as pt needs to become more independent for gait, needs to mitigate fall risk when returning home.  ROM to ankles is a priority to decrease fall risk.  Follow Up Recommendations  Home health PT;Supervision for mobility/OOB (then outpatient)     Equipment Recommendations  Cane    Recommendations for Other Services Rehab consult     Precautions / Restrictions Precautions Precautions: Fall (telemetry) Restrictions Weight Bearing Restrictions: No    Mobility  Bed Mobility               General bed mobility comments: up when PT entered  Transfers Overall transfer level: Needs assistance Equipment used: 1 person hand held assist (with IV pole) Transfers: Sit to/from Stand;Stand Pivot Transfers Sit to Stand: Min guard Stand pivot transfers: Min guard          Ambulation/Gait Ambulation/Gait assistance: Min guard Ambulation Distance (Feet): 300 Feet Assistive device: 1 person hand held assist (IV pole) Gait Pattern/deviations: Step-through pattern;Wide base of support;Shuffle;Decreased stride length Gait velocity: reduced Gait velocity interpretation: Below normal speed for age/gender General Gait Details: ankles are stiff and hips in flexion contractures still contributing to step and stride reduction in length   Stairs             Wheelchair Mobility    Modified Rankin (Stroke Patients Only)       Balance Overall balance assessment: Needs assistance Sitting-balance support: Feet supported Sitting balance-Leahy Scale: Good     Standing balance support: Bilateral upper extremity supported Standing balance-Leahy Scale: Fair                      Cognition Arousal/Alertness: Awake/alert Behavior During Therapy: WFL for tasks assessed/performed Overall Cognitive Status: Within Functional Limits for tasks assessed                      Exercises General Exercises - Lower Extremity Ankle Circles/Pumps: AROM;Both;Strengthening;15 reps Quad Sets: AROM;Both;15 reps Hip ABduction/ADduction: Strengthening;Both;15 reps    General Comments General comments (skin integrity, edema, etc.): Pt was able to walk with less assistance today, has family in to visit during his therapy session      Pertinent Vitals/Pain Pain Assessment: No/denies pain    Home Living                      Prior Function            PT Goals (current goals can now be found in the care plan section) Acute Rehab PT Goals Patient Stated Goal: to feel better and get home Progress towards PT goals: Progressing toward goals    Frequency  Min 3X/week    PT Plan Current plan remains appropriate    Co-evaluation             End of Session  Equipment Utilized During Treatment: Gait belt Activity Tolerance: Patient tolerated treatment well;Patient limited by fatigue Patient left: in chair;with call bell/phone within reach;with chair alarm set;with family/visitor present     Time: [id number] PT Time Calculation (min) (ACUTE ONLY): 19 min  Charges:  $Gait Training: 8-22 mins                    G Codes:      Ramond Dial 11-10-15, 12:53 PM    Mee Hives, PT MS Acute Rehab Dept. Number: [place]

## 2015-11-05 NOTE — Progress Notes (Signed)
PROGRESS NOTE    [name].  [id number] DOB: December 23, 1930 DOA: 11/03/2015 PCP: Binnie Rail, MD   Brief Narrative:  HPI on 11/03/2015 by Dr. Delbert Phenix. is a 82 y.o. male with medical history significant of hypertension, hyperlipidemia, COPD, asthma, CAD, s/p of stent, OSA on CPAP, PAF on Eliquis, CKD-II, dCHF, PVD, who presents with dysuria, diarrhea, hyponatremia.  Patient reports that he has been having diarrhea for almost 3 weeks. He has 5-6 watery bowel movements each day. He has mild dizziness. No nausea, vomiting or abdominal pain. No fever or chills. He also has some mild dysuria, no burning on urination. Patient has mild cough due to asthma and COPD, which is at baseline. No chest pain or SOB. Patient does not have unilateral weakness, hematuria or rash. Pt was seen by his PCP and was found to have "very low sodium" and was seen here for further evaluation and treatment.  Assessment & Plan    Hyponatremia -Sodium upon admission 120 , currently 128 -This is most likely due to diarrhea and continuation of diuretics vs acute on chronic.  After reviewing records, patient has had low sodium in the past. Mental status normal.  -Continue IVF and monitor BMP -TSH 1.764 (10/17/2015)  Diarrhea  -patient has 5 to 6 watery bowel movements each day.  -Etiology is not clear.  -Patient is not taking laxatives  -Cdiff negative -Gastroenterology consulted and appreciated -CT abd/pelvis: sigmoid colon thickening, no acute findings -Plan for flex sig today  UTI -patient has dysuria and a positive urinalysis, consistent with UTI. Patient is not septic on admission. Hemodynamically stable. -Urine culture: >100K GNR -Continue ceftriaxone  PAF -CHA2DS2-VASc 5  -Continue Eliquis -Heart rate controlled, continue Zebeta  Hyperlipidemia -Last LDL was 72 on 2/10 and 2/17  -Continue statin  Coronary atherosclerosis  -s/p of stent 2012 -Currently no chest  pain -continue Plavix, Zebeta, pravastatin, Imdur  Asthma with COPD ([place]) -stable, no wheezing, however, does have cough -Continue Albuterol nebulizer PRN, singulair, Dulera inhaler, prn Mucinex for cough -CXR unremarkable for infection  GERD -Protonix held due to diarrhea -Per wife protonix has not really helped  -continue Pepcid for daily to 20 mg bid  Chronic diastolic heart failure ([place]) -Echocardiogram 09/24/15 showed EF 60-65 percent.  -Currently euvolemic/compensated -Hold torsemide and spironolactone due to hyponatremia -BNP 245.7  Essential hypertension  -continue Irbesartan and zebeta -IV hydralazine when necessary  DVT Prophylaxis  Eliquis  Code Status: Full  Family Communication: None at bedside  Disposition Plan: Admitted for observation, pending Flex sig today  Consultants Gastroenterology  Procedures  None  Antibiotics   Anti-infectives    Start     Dose/Rate Route Frequency Ordered Stop   11/03/15 2300  cefTRIAXone (ROCEPHIN) 1 g in dextrose 5 % 50 mL IVPB     1 g 100 mL/hr over 30 Minutes Intravenous Daily at bedtime 11/03/15 2222        Subjective:   [name] seen and examined today.  Complains of diarrhea, and feeling weak. Feels he is always in the hospital.  Denies chest pain, shortness of breath, abdominal pain  Objective:   Vitals:   11/04/15 1131 11/04/15 1950 11/04/15 2102 11/05/15 1130  BP: 132/81  (!) 156/67 (!) 159/71  Pulse: 79  84 78  Resp: 16  16 16   Temp: 98.5 F (36.9 C)  98 F (36.7 C) 97.6 F (36.4 C)  TempSrc: Oral  Oral Oral  SpO2: 97% 96% 99%  100%  Weight:      Height:        Intake/Output Summary (Last 24 hours) at 11/05/15 1137 Last data filed at 11/05/15 0903  Gross per 24 hour  Intake             1542 ml  Output             1876 ml  Net             -334 ml   Filed Weights   11/03/15 1912 11/04/15 0022  Weight: 67.6 kg (149 lb) 65.2 kg (143 lb 12.8 oz)    Exam  General: Well developed, well  nourished, NAD, appears stated age  HEENT: NCAT, mucous membranes moist.   Cardiovascular: S1 S2 auscultated, no murmurs, RRR  Respiratory: Clear to auscultation bilaterally with equal chest rise, occ cough  Abdomen: Soft, nontender, nondistended, + bowel sounds  Extremities: warm dry without cyanosis clubbing or edema  Neuro: AAOx3, nonfocal  Psych: Normal affect and demeanor with intact judgement and insight, pleasant  Data Reviewed: I have personally reviewed following labs and imaging studies  CBC:  Recent Labs Lab 11/03/15 1420 11/03/15 1926 11/04/15 0827 11/05/15 0532  WBC 11.4* 11.7* 8.7 7.3  NEUTROABS 8.7* 8.6*  --   --   HGB 11.6* 11.3* 11.4* 10.4*  HCT 33.8* 32.2* 33.6* 30.4*  MCV 88.0 86.1 86.8 86.9  PLT 259.0 220 216 [id number]   Basic Metabolic Panel:  Recent Labs Lab 11/04/15 0237 11/04/15 0827 11/04/15 1347 11/04/15 1952 11/05/15 0740  NA 123* 124* 127* 125* 128*  K 4.0 4.2 4.0 4.7 4.4  CL 91* 93* 96* 95* 97*  CO2 24 25 25 25 24   GLUCOSE 144* 129* 87 100* 100*  BUN 14 11 10 11 8   CREATININE 1.04 0.86 0.95 0.97 0.78  CALCIUM 8.2* 8.5* 8.4* 8.5* 8.8*   GFR: Estimated Creatinine Clearance: 55 mL/min (by C-G formula based on SCr of 0.8 mg/dL). Liver Function Tests:  Recent Labs Lab 11/03/15 1420 11/03/15 1926  AST 15 18  ALT 14 17  ALKPHOS 49 53  BILITOT 0.5 0.5  PROT 6.6 6.3*  ALBUMIN 3.7 3.2*   No results for input(s): LIPASE, AMYLASE in the last 168 hours. No results for input(s): AMMONIA in the last 168 hours. Coagulation Profile: No results for input(s): INR, PROTIME in the last 168 hours. Cardiac Enzymes: No results for input(s): CKTOTAL, CKMB, CKMBINDEX, TROPONINI in the last 168 hours. BNP (last 3 results) No results for input(s): PROBNP in the last 8760 hours. HbA1C: No results for input(s): HGBA1C in the last 72 hours. CBG: No results for input(s): GLUCAP in the last 168 hours. Lipid Profile: No results for input(s): CHOL,  HDL, LDLCALC, TRIG, CHOLHDL, LDLDIRECT in the last 72 hours. Thyroid Function Tests: No results for input(s): TSH, T4TOTAL, FREET4, T3FREE, THYROIDAB in the last 72 hours. Anemia Panel: No results for input(s): VITAMINB12, FOLATE, FERRITIN, TIBC, IRON, RETICCTPCT in the last 72 hours. Urine analysis:    Component Value Date/Time   COLORURINE YELLOW 11/03/2015 1934   APPEARANCEUR CLEAR 11/03/2015 1934   LABSPEC <1.005 (L) 11/03/2015 1934   PHURINE 7.0 11/03/2015 1934   GLUCOSEU NEGATIVE 11/03/2015 1934   GLUCOSEU NEGATIVE 06/19/2014 0941   HGBUR TRACE (A) 11/03/2015 1934   BILIRUBINUR NEGATIVE 11/03/2015 1934   BILIRUBINUR negative 09/20/2015 1946   KETONESUR NEGATIVE 11/03/2015 1934   PROTEINUR NEGATIVE 11/03/2015 1934   UROBILINOGEN 0.2 09/20/2015 1946   UROBILINOGEN 0.2 09/17/2014 2215  NITRITE POSITIVE (A) 11/03/2015 1934   LEUKOCYTESUR MODERATE (A) 11/03/2015 1934   Sepsis Labs: @LABRCNTIP (procalcitonin:4,lacticidven:4)  ) Recent Results (from the past 240 hour(s))  Urine culture     Status: Abnormal (Preliminary result)   Collection Time: 11/03/15  7:34 PM  Result Value Ref Range Status   Specimen Description URINE, RANDOM  Final   Special Requests NONE  Final   Culture >=100,000 COLONIES/mL GRAM NEGATIVE RODS (A)  Final   Report Status PENDING  Incomplete  C difficile quick scan w PCR reflex     Status: None   Collection Time: 11/04/15  2:07 AM  Result Value Ref Range Status   C Diff antigen NEGATIVE NEGATIVE Final   C Diff toxin NEGATIVE NEGATIVE Final   C Diff interpretation No C. difficile detected.  Final      Radiology Studies: Dg Chest 2 View  Result Date: 11/04/2015 CLINICAL DATA:  Cough and SOB x1 month. Pt denies chest pain. Hx of 3 cardiac stents and open heart surgery, pt is unsure when these surgeries occurred. Hx of asthma, CAD, COPD, CHF. Ex-smoker, pt unsure how long ago he quit. EXAM: CHEST  2 VIEW COMPARISON:  10/17/2015 FINDINGS: Status post  median sternotomy and CABG. The heart is upper limits normal in size. There are no focal consolidations or pleural effusions. No pulmonary edema. IMPRESSION: No active cardiopulmonary disease. Electronically Signed   By: Nolon Nations M.D.   On: 11/04/2015 14:40  Ct Abdomen Pelvis W Contrast  Result Date: 11/05/2015 CLINICAL DATA:  82 year old male with diarrhea and hyponatremia. EXAM: CT ABDOMEN AND PELVIS WITH CONTRAST TECHNIQUE: Multidetector CT imaging of the abdomen and pelvis was performed using the standard protocol following bolus administration of intravenous contrast. CONTRAST:  179mL ISOVUE-300 IOPAMIDOL (ISOVUE-300) INJECTION 61% COMPARISON:  Abdominal radiograph dated 02/23/2015 FINDINGS: The visualized lung bases are clear. There is coronary vascular calcification. No intra-abdominal free air or free fluid. Multiple hepatic hypodense lesions with fluid attenuation measuring up to 2 cm in the left lobe of the liver appear to have been present on the chest CT dated 12/09/2013. These lesions are incompletely characterized on the CT but likely represent cysts. MRI may provide better characterization. Cholecystectomy. There aretwo small calcification along the extrahepatic biliary tree measuring up to 6 mm which may represent chronic calcification versus less likely retained stone within the CBD. There is no biliary ductal dilatation. The pancreas, spleen, adrenal glands, appear unremarkable. There is mild bilateral renal atrophy. There is a 2.5 cm right renal cyst. Right renal inferior pole scarring noted. There is no hydronephrosis on either side. The visualized ureters and urinary bladder appear unremarkable. The prostate and seminal vesicles are grossly unremarkable. There is sigmoid diverticulosis with muscular hypertrophy. Mild thickened appearance of the sigmoid colon without surrounding inflammatory changes, may be chronic. Clinical correlation is recommended to exclude mild colitis. There is  no evidence of bowel obstruction or definite active inflammation. There is a small hiatal hernia. The appendix herniates into the right inguinal canal and appears unremarkable. There is advanced aortoiliac atherosclerotic disease. There is atherosclerotic calcification of the origins of the celiac axis, SMA, IMA with severe narrowing of the origins of the SMA and celiac axis. These vessels however remain patent. The splenic vein, SMV, and main portal vein are patent. No portal venous gas identified. There is no adenopathy. There are bilateral fat containing inguinal hernias, right greater than left. The right inguinal hernia also contains the appendix. There is a small fat containing umbilical  hernia. The abdominal wall soft tissues are otherwise unremarkable. No fluid collection. There is osteopenia with degenerative changes of the spine. Multilevel disc desiccation with vacuum phenomena. No acute fracture. Focal sclerotic lesion in the right iliac bone most likely benign and a bone island. IMPRESSION: No definite acute intra-abdominal or pelvic pathology. Sigmoid diverticulosis. Thickened appearance of the distal descending colon and sigmoid, may be chronic. Clinical correlation is recommended to evaluate for mild colitis. No bowel obstruction. The appendix is located in the right inguinal hernia and appears unremarkable. Nonacute findings as described above. Electronically Signed   By: Anner Crete M.D.   On: 11/05/2015 04:19    Scheduled Meds: . apixaban  5 mg Oral BID  . bisoprolol  20 mg Oral Daily  . calcium-vitamin D  1 tablet Oral BID WC  . cefTRIAXone (ROCEPHIN)  IV  1 g Intravenous QHS  . cholestyramine  8 g Oral QPC lunch  . clopidogrel  75 mg Oral Q breakfast  . famotidine  20 mg Oral BID  . irbesartan  300 mg Oral Daily  . isosorbide mononitrate  60 mg Oral BID  . latanoprost  1 drop Both Eyes QHS  . LORazepam  0.5 mg Oral BID  . mometasone-formoterol  2 puff Inhalation BID  .  montelukast  10 mg Oral QHS  . pravastatin  80 mg Oral q1800  . saccharomyces boulardii  250 mg Oral BID  . sodium chloride flush  3 mL Intravenous Q12H  . vitamin B-12  1,000 mcg Oral Daily   Continuous Infusions: . sodium chloride 75 mL/hr at 11/04/15 0051     LOS: 0 days   Time Spent in minutes   30 minutes  Raine Elsass D.O. on 11/05/2015 at 11:37 AM  Between 7am to 7pm - Pager - [phone]  After 7pm go to [email or web address] - password [email or web address]  And look for the night coverage person covering for me after hours  [facility] Office  [phone]

## 2015-11-05 NOTE — Progress Notes (Signed)
Daily Rounding Note  11/05/2015, 8:41 AM  LOS: 0 days   SUBJECTIVE:   Chief complaint: loose stools   Loose, moderately large BM this AM.  No pain.  Had clears for breakfast.   OBJECTIVE:         Vital signs in last 24 hours:    Temp:  [98 F (36.7 C)-98.5 F (36.9 C)] 98 F (36.7 C) (07/27 2102) Pulse Rate:  [79-87] 84 (07/27 2102) Resp:  [16] 16 (07/27 2102) BP: (115-156)/(46-81) 156/67 (07/27 2102) SpO2:  [96 %-99 %] 99 % (07/27 2102) Last BM Date: 11/04/15 Filed Weights   11/03/15 1912 11/04/15 0022  Weight: 67.6 kg (149 lb) 65.2 kg (143 lb 12.8 oz)   General: pleasant, somewhat frail but not acutely ill   Heart: RRR with 2/6 murmer Chest: clear bil, though reduced BS bil.  Abdomen: soft, NT, ND.  Active BS  Extremities: no CCE Neuro/Psych:  Calm, pleasant, no gross deficits or weakness  Intake/Output from previous day: 07/27 0701 - 07/28 0700 In: 1542 [P.O.:1442; IV Piggyback:100] Out: 2476 [Urine:2475; Stool:1]  Intake/Output this shift: Total I/O In: 240 [P.O.:240] Out: 250 [Urine:250]  Lab Results:  Recent Labs  11/03/15 1926 11/04/15 0827 11/05/15 0532  WBC 11.7* 8.7 7.3  HGB 11.3* 11.4* 10.4*  HCT 32.2* 33.6* 30.4*  PLT 220 216 203   BMET  Recent Labs  11/04/15 0827 11/04/15 1347 11/04/15 1952  NA 124* 127* 125*  K 4.2 4.0 4.7  CL 93* 96* 95*  CO2 25 25 25   GLUCOSE 129* 87 100*  BUN 11 10 11   CREATININE 0.86 0.95 0.97  CALCIUM 8.5* 8.4* 8.5*   LFT  Recent Labs  11/03/15 1420 11/03/15 1926  PROT 6.6 6.3*  ALBUMIN 3.7 3.2*  AST 15 18  ALT 14 17  ALKPHOS 49 53  BILITOT 0.5 0.5   PT/INR No results for input(s): LABPROT, INR in the last 72 hours. Hepatitis Panel No results for input(s): HEPBSAG, HCVAB, HEPAIGM, HEPBIGM in the last 72 hours.  Studies/Results: Dg Chest 2 View  Result Date: 11/04/2015 CLINICAL DATA:  Cough and SOB x1 month. Pt denies chest pain. Hx  of 3 cardiac stents and open heart surgery, pt is unsure when these surgeries occurred. Hx of asthma, CAD, COPD, CHF. Ex-smoker, pt unsure how long ago he quit. EXAM: CHEST  2 VIEW COMPARISON:  10/17/2015 FINDINGS: Status post median sternotomy and CABG. The heart is upper limits normal in size. There are no focal consolidations or pleural effusions. No pulmonary edema. IMPRESSION: No active cardiopulmonary disease. Electronically Signed   By: Nolon Nations M.D.   On: 11/04/2015 14:40  Ct Abdomen Pelvis W Contrast  Result Date: 11/05/2015 CLINICAL DATA:  82 year old male with diarrhea and hyponatremia. EXAM: CT ABDOMEN AND PELVIS WITH CONTRAST TECHNIQUE: Multidetector CT imaging of the abdomen and pelvis was performed using the standard protocol following bolus administration of intravenous contrast. CONTRAST:  114mL ISOVUE-300 IOPAMIDOL (ISOVUE-300) INJECTION 61% COMPARISON:  Abdominal radiograph dated 02/23/2015 FINDINGS: The visualized lung bases are clear. There is coronary vascular calcification. No intra-abdominal free air or free fluid. Multiple hepatic hypodense lesions with fluid attenuation measuring up to 2 cm in the left lobe of the liver appear to have been present on the chest CT dated 12/09/2013. These lesions are incompletely characterized on the CT but likely represent cysts. MRI may provide better characterization. Cholecystectomy. There aretwo small calcification along the extrahepatic biliary tree measuring  up to 6 mm which may represent chronic calcification versus less likely retained stone within the CBD. There is no biliary ductal dilatation. The pancreas, spleen, adrenal glands, appear unremarkable. There is mild bilateral renal atrophy. There is a 2.5 cm right renal cyst. Right renal inferior pole scarring noted. There is no hydronephrosis on either side. The visualized ureters and urinary bladder appear unremarkable. The prostate and seminal vesicles are grossly unremarkable. There  is sigmoid diverticulosis with muscular hypertrophy. Mild thickened appearance of the sigmoid colon without surrounding inflammatory changes, may be chronic. Clinical correlation is recommended to exclude mild colitis. There is no evidence of bowel obstruction or definite active inflammation. There is a small hiatal hernia. The appendix herniates into the right inguinal canal and appears unremarkable. There is advanced aortoiliac atherosclerotic disease. There is atherosclerotic calcification of the origins of the celiac axis, SMA, IMA with severe narrowing of the origins of the SMA and celiac axis. These vessels however remain patent. The splenic vein, SMV, and main portal vein are patent. No portal venous gas identified. There is no adenopathy. There are bilateral fat containing inguinal hernias, right greater than left. The right inguinal hernia also contains the appendix. There is a small fat containing umbilical hernia. The abdominal wall soft tissues are otherwise unremarkable. No fluid collection. There is osteopenia with degenerative changes of the spine. Multilevel disc desiccation with vacuum phenomena. No acute fracture. Focal sclerotic lesion in the right iliac bone most likely benign and a bone island. IMPRESSION: No definite acute intra-abdominal or pelvic pathology. Sigmoid diverticulosis. Thickened appearance of the distal descending colon and sigmoid, may be chronic. Clinical correlation is recommended to evaluate for mild colitis. No bowel obstruction. The appendix is located in the right inguinal hernia and appears unremarkable. Nonacute findings as described above. Electronically Signed   By: Anner Crete M.D.   On: 11/05/2015 04:19  Scheduled Meds: . apixaban  5 mg Oral BID  . bisoprolol  20 mg Oral Daily  . calcium-vitamin D  1 tablet Oral BID WC  . cefTRIAXone (ROCEPHIN)  IV  1 g Intravenous QHS  . cholestyramine  8 g Oral QPC lunch  . clopidogrel  75 mg Oral Q breakfast  .  famotidine  20 mg Oral BID  . irbesartan  300 mg Oral Daily  . isosorbide mononitrate  60 mg Oral BID  . latanoprost  1 drop Both Eyes QHS  . LORazepam  0.5 mg Oral BID  . mometasone-formoterol  2 puff Inhalation BID  . montelukast  10 mg Oral QHS  . pravastatin  80 mg Oral q1800  . saccharomyces boulardii  250 mg Oral BID  . sodium chloride flush  3 mL Intravenous Q12H  . vitamin B-12  1,000 mcg Oral Daily   Continuous Infusions: . sodium chloride 75 mL/hr at 11/04/15 0051   PRN Meds:.acetaminophen, albuterol, dextromethorphan-guaiFENesin, hydrALAZINE, nitroGLYCERIN, ondansetron **OR** ondansetron (ZOFRAN) IV, traZODone   ASSESMENT:   *  Diarrhea, actually sounds more due to fecal incontinence than actual large volume diarrhea.  Issue dates back to several years ago.  Dr Henrene Pastor attributed it to bile salts.  C diff negative.   Do not think this is infectious.   R/o Microscopic colitis. Sigmoid thickening on CT, may be due to the diverticulosis vs real finding. On Florastor.   *  Hyponatremia. ? If this is entirely due to excessive stools?   Pt has remote hx of etoh abuse.  Liver cysts but no cirrhosis per CT, thus  liver disease not driving hyponatremia.      *  ? UTI.  U/A with + nitrite, moderate leukocytes, 6 to 30 WBCs. Rocephin in place. Clx in process. Pt not having dysuria.    *  GERD.  Sxs stable off PPI and on H2 blocker.  While on PPI had, and continues to have, frequent reflux and pyrosis.  Reflux felt to be trigger for cough and asthma.  Stopping PPI also has not significantly improved the "diarrhea".   *  CAD.  Previous CABG and subsequent stenting to graft. Diastolic heart failure. On Eliquis and Plavix. No chest pain.     PLAN   *  Flex sig this afternoon on plavix and Eliquis. .  Enema prep.  Risks of bleeding d/w pt who agrees to proceed.     [name]  11/05/2015, 8:41 AM Pager: [phone]

## 2015-11-05 NOTE — Op Note (Signed)
[facility] Patient Name: [name] Procedure Date : 11/05/2015 MRN: [id number] Attending MD: Jerene Bears , MD Date of Birth: 1930-08-24 CSN: [id number] Age: 82 Admit Type: Inpatient Procedure:                Flexible Sigmoidoscopy Indications:              Clinically significant diarrhea of unexplained                            origin Providers:                Lajuan Lines. Hilarie Fredrickson, MD, Malka So, RN, William Dalton, Technician Referring MD:             [facility] Medicines:                Fentanyl 50 micrograms IV, Midazolam 4 mg IV Complications:            No immediate complications. Estimated Blood Loss:     Estimated blood loss was minimal. Procedure:                Pre-Anesthesia Assessment:                           - Prior to the procedure, a History and Physical                            was performed, and patient medications and                            allergies were reviewed. The patient's tolerance of                            previous anesthesia was also reviewed. The risks                            and benefits of the procedure and the sedation                            options and risks were discussed with the patient.                            All questions were answered, and informed consent                            was obtained. Prior Anticoagulants: The patient has                            taken Plavix (clopidogrel) and Eliquis, last dose                            was day of procedure. ASA Grade Assessment: III - A  patient with severe systemic disease. After                            reviewing the risks and benefits, the patient was                            deemed in satisfactory condition to undergo the                            procedure.                           After obtaining informed consent, the scope was                            passed under direct  vision. The [id number]                            scope was introduced through the anus and advanced                            to the the descending colon. The flexible                            sigmoidoscopy was accomplished without difficulty.                            The patient tolerated the procedure well. The                            quality of the bowel preparation was good. Scope In: 2:24:27 PM Scope Out: 2:29:40 PM Total Procedure Duration: 0 hours 5 minutes 13 seconds  Findings:      The digital rectal exam was normal. Anorectal tone normal.      Multiple small and large-mouthed diverticula were found in the sigmoid       colon and descending colon.      Normal mucosa was found in the rectum, in the sigmoid colon and in the       distal descending colon. Biopsies for histology were taken with a cold       forceps from the descending colon and sigmoid colon for evaluation of       microscopic colitis.      The retroflexed view of the distal rectum and anal verge was normal and       showed no anal or rectal abnormalities. Impression:               - Moderate diverticulosis in the sigmoid colon and                            in the descending colon.                           - Normal mucosa in the rectum, in the sigmoid colon  and in the distal descending colon. Biopsied. Moderate Sedation:      Moderate (conscious) sedation was administered by the endoscopy nurse       and supervised by the endoscopist. The following parameters were       monitored: oxygen saturation, heart rate, blood pressure, and response       to care. Total physician intraservice time was 15 minutes. Recommendation:           - Return patient to hospital ward for ongoing care.                           - Resume previous diet.                           - Await pathology results.                           - Continue cholestyramine 4 g daily. Add lomotil 1                             tab 3-4 times daily as needed for loose                            stools/diarrhea.                           - Office follow-up if loose stools or diarrhea                            persist. Procedure Code(s):        --- Professional ---                           [phone], Sigmoidoscopy, flexible; with biopsy, single                            or multiple                           G0500, Moderate sedation services provided by the                            same physician or other qualified health care                            professional performing a gastrointestinal                            endoscopic service that sedation supports,                            requiring the presence of an independent trained                            observer to assist in the monitoring of the  patient's level of consciousness and physiological                            status; initial 15 minutes of intra-service time;                            patient age 21 years or older (additional time may                            be reported with [phone], as appropriate) Diagnosis Code(s):        --- Professional ---                           R19.7, Diarrhea, unspecified                           K57.30, Diverticulosis of large intestine without                            perforation or abscess without bleeding CPT copyright 2016 [employer]. All rights reserved. The codes documented in this report are preliminary and upon coder review may  be revised to meet current compliance requirements. Jerene Bears, MD 11/05/2015 2:37:11 PM This report has been signed electronically. Number of Addenda: 0

## 2015-11-06 ENCOUNTER — Encounter (HOSPITAL_COMMUNITY): Payer: Self-pay | Admitting: Internal Medicine

## 2015-11-06 DIAGNOSIS — K529 Noninfective gastroenteritis and colitis, unspecified: Secondary | ICD-10-CM | POA: Diagnosis not present

## 2015-11-06 DIAGNOSIS — J449 Chronic obstructive pulmonary disease, unspecified: Secondary | ICD-10-CM | POA: Diagnosis not present

## 2015-11-06 DIAGNOSIS — D509 Iron deficiency anemia, unspecified: Secondary | ICD-10-CM | POA: Diagnosis not present

## 2015-11-06 DIAGNOSIS — I5032 Chronic diastolic (congestive) heart failure: Secondary | ICD-10-CM | POA: Diagnosis not present

## 2015-11-06 DIAGNOSIS — E871 Hypo-osmolality and hyponatremia: Secondary | ICD-10-CM | POA: Diagnosis not present

## 2015-11-06 DIAGNOSIS — I480 Paroxysmal atrial fibrillation: Secondary | ICD-10-CM

## 2015-11-06 LAB — CBC
HEMATOCRIT: 30.3 % — AB (ref 39.0–52.0)
Hemoglobin: 9.9 g/dL — ABNORMAL LOW (ref 13.0–17.0)
MCH: 29.1 pg (ref 26.0–34.0)
MCHC: 32.7 g/dL (ref 30.0–36.0)
MCV: 89.1 fL (ref 78.0–100.0)
PLATELETS: 219 10*3/uL (ref 150–400)
RBC: 3.4 MIL/uL — ABNORMAL LOW (ref 4.22–5.81)
RDW: 16.4 % — AB (ref 11.5–15.5)
WBC: 5.2 10*3/uL (ref 4.0–10.5)

## 2015-11-06 LAB — BASIC METABOLIC PANEL
Anion gap: 6 (ref 5–15)
BUN: 8 mg/dL (ref 6–20)
CHLORIDE: 99 mmol/L — AB (ref 101–111)
CO2: 25 mmol/L (ref 22–32)
CREATININE: 0.78 mg/dL (ref 0.61–1.24)
Calcium: 8.5 mg/dL — ABNORMAL LOW (ref 8.9–10.3)
GFR calc Af Amer: >60 mL/min (ref >60)
GFR calc non Af Amer: >60 mL/min (ref >60)
Glucose, Bld: 102 mg/dL — ABNORMAL HIGH (ref 65–99)
POTASSIUM: 4.1 mmol/L (ref 3.5–5.1)
SODIUM: 130 mmol/L — AB (ref 135–145)

## 2015-11-06 LAB — URINE CULTURE: Culture: >=100000 — AB

## 2015-11-06 MED ORDER — TORSEMIDE 20 MG PO TABS
20.0000 mg | ORAL_TABLET | Freq: Every day | ORAL | Status: DC
  Administered 2015-11-06: 20 mg via ORAL
  Filled 2015-11-06 (×2): qty 1

## 2015-11-06 MED ORDER — SPIRONOLACTONE 25 MG PO TABS
25.0000 mg | ORAL_TABLET | Freq: Every day | ORAL | Status: DC
  Administered 2015-11-06 – 2015-11-07 (×2): 25 mg via ORAL
  Filled 2015-11-06 (×2): qty 1

## 2015-11-06 NOTE — Progress Notes (Signed)
PROGRESS NOTE    [name].  RK:[id number] DOB: [birth year] DOA: 11/03/2015 PCP: Binnie Rail, MD   Brief Narrative:  HPI on 11/03/2015 by Dr. Delbert Phenix. is a 82 y.o. male with medical history significant of hypertension, hyperlipidemia, COPD, asthma, CAD, s/p of stent, OSA on CPAP, PAF on Eliquis, CKD-II, dCHF, PVD, who presents with dysuria, diarrhea, hyponatremia.  Patient reports that he has been having diarrhea for almost 3 weeks. He has 5-6 watery bowel movements each day. He has mild dizziness. No nausea, vomiting or abdominal pain. No fever or chills. He also has some mild dysuria, no burning on urination. Patient has mild cough due to asthma and COPD, which is at baseline. No chest pain or SOB. Patient does not have unilateral weakness, hematuria or rash. Pt was seen by his PCP and was found to have "very low sodium" and was seen here for further evaluation and treatment.  Assessment & Plan    Hyponatremia -Sodium upon admission 120 , currently 130 -This is most likely due to diarrhea and continuation of diuretics vs acute on chronic.  After reviewing records, patient has had low sodium in the past. Mental status normal.  -Continue IVF and monitor BMP -TSH 1.764 (10/17/2015)  Diarrhea  -patient has 5 to 6 watery bowel movements each day.  -Etiology is not clear.  -Patient is not taking laxatives  -Cdiff negative -Gastroenterology consulted and appreciated -CT abd/pelvis: sigmoid colon thickening, no acute findings -Flex sig 11/05/15: moderate diverticulosis in sigmoid and descending colon, biopsy taken and pending -Recommended lomotil 3-4 times per day PRN  UTI -patient has dysuria and a positive urinalysis, consistent with UTI. Patient is not septic on admission. Hemodynamically stable. -Urine culture: >100K GNR- Klebsiella pneumoniae  -Continue ceftriaxone  PAF -CHA2DS2-VASc 5  -Continue Eliquis -Heart rate controlled, continue  Zebeta  Hyperlipidemia -Last LDL was 72 on 2/10 and 2/17  -Continue statin  Coronary atherosclerosis  -s/p of stent 2012 -Currently no chest pain -continue Plavix, Zebeta, pravastatin, Imdur  Asthma with COPD ([place]) -stable, no wheezing, however, does have cough -Continue Albuterol nebulizer PRN, singulair, Dulera inhaler, prn Mucinex for cough -CXR unremarkable for infection  GERD -Protonix held due to diarrhea -Per wife protonix has not really helped  -continue Pepcid for daily to 20 mg bid  Chronic diastolic heart failure ([place]) -Echocardiogram 09/24/15 showed EF 60-65 percent.  -Currently euvolemic/compensated -torsemide and spironolactone held due to hyponatremia, will restart today and monitor closely  -BNP 245.7  Essential hypertension  -continue Irbesartan and zebeta -IV hydralazine when necessary  DVT Prophylaxis  Eliquis  Code Status: Full  Family Communication: None at bedside  Disposition Plan: Admitted for observation, continue to monitor sodium, possibly discharge within 24 hours  Consultants Gastroenterology  Procedures  None  Antibiotics   Anti-infectives    Start     Dose/Rate Route Frequency Ordered Stop   11/03/15 2300  cefTRIAXone (ROCEPHIN) 1 g in dextrose 5 % 50 mL IVPB     1 g 100 mL/hr over 30 Minutes Intravenous Daily at bedtime 11/03/15 2222        Subjective:   [name] seen and examined today.  Feels improved as compared to previous days.  Feels diarrhea is improving. Denies chest pain, shortness of breath, abdominal pain  Objective:   Vitals:   11/05/15 1441 11/05/15 2208 11/06/15 0421 11/06/15 0846  BP: (!) 155/64 (!) 158/71 (!) 158/70   Pulse: 73 73 78   Resp:  16 17 18    Temp:  98.3 F (36.8 C) 98.3 F (36.8 C)   TempSrc: Oral Oral Oral   SpO2: 99% 97% 97% 98%  Weight:   64.5 kg (142 lb 4.8 oz)   Height:        Intake/Output Summary (Last 24 hours) at 11/06/15 1035 Last data filed at 11/06/15 0948  Gross  per 24 hour  Intake              732 ml  Output             1250 ml  Net             -518 ml   Filed Weights   11/03/15 1912 11/04/15 0022 11/06/15 0421  Weight: 67.6 kg (149 lb) 65.2 kg (143 lb 12.8 oz) 64.5 kg (142 lb 4.8 oz)    Exam  General: Well developed, well nourished, NAD, appears stated age  46: NCAT, mucous membranes moist.   Cardiovascular: S1 S2 auscultated, no murmurs, RRR  Respiratory: Clear to auscultation bilaterally with equal chest rise, occ cough  Abdomen: Soft, nontender, nondistended, + bowel sounds  Extremities: warm dry without cyanosis clubbing or edema  Neuro: AAOx3, nonfocal  Psych: Normal affect and demeanor with intact judgement and insight, pleasant  Data Reviewed: I have personally reviewed following labs and imaging studies  CBC:  Recent Labs Lab 11/03/15 1420 11/03/15 1926 11/04/15 0827 11/05/15 0532 11/06/15 0403  WBC 11.4* 11.7* 8.7 7.3 5.2  NEUTROABS 8.7* 8.6*  --   --   --   HGB 11.6* 11.3* 11.4* 10.4* 9.9*  HCT 33.8* 32.2* 33.6* 30.4* 30.3*  MCV 88.0 86.1 86.8 86.9 89.1  PLT 259.0 220 216 203 [id number]   Basic Metabolic Panel:  Recent Labs Lab 11/04/15 0827 11/04/15 1347 11/04/15 1952 11/05/15 0740 11/06/15 0403  NA 124* 127* 125* 128* 130*  K 4.2 4.0 4.7 4.4 4.1  CL 93* 96* 95* 97* 99*  CO2 25 25 25 24 25   GLUCOSE 129* 87 100* 100* 102*  BUN 11 10 11 8 8   CREATININE 0.86 0.95 0.97 0.78 0.78  CALCIUM 8.5* 8.4* 8.5* 8.8* 8.5*   GFR: Estimated Creatinine Clearance: 55 mL/min (by C-G formula based on SCr of 0.8 mg/dL). Liver Function Tests:  Recent Labs Lab 11/03/15 1420 11/03/15 1926  AST 15 18  ALT 14 17  ALKPHOS 49 53  BILITOT 0.5 0.5  PROT 6.6 6.3*  ALBUMIN 3.7 3.2*   No results for input(s): LIPASE, AMYLASE in the last 168 hours. No results for input(s): AMMONIA in the last 168 hours. Coagulation Profile: No results for input(s): INR, PROTIME in the last 168 hours. Cardiac Enzymes: No results for  input(s): CKTOTAL, CKMB, CKMBINDEX, TROPONINI in the last 168 hours. BNP (last 3 results) No results for input(s): PROBNP in the last 8760 hours. HbA1C: No results for input(s): HGBA1C in the last 72 hours. CBG: No results for input(s): GLUCAP in the last 168 hours. Lipid Profile: No results for input(s): CHOL, HDL, LDLCALC, TRIG, CHOLHDL, LDLDIRECT in the last 72 hours. Thyroid Function Tests: No results for input(s): TSH, T4TOTAL, FREET4, T3FREE, THYROIDAB in the last 72 hours. Anemia Panel: No results for input(s): VITAMINB12, FOLATE, FERRITIN, TIBC, IRON, RETICCTPCT in the last 72 hours. Urine analysis:    Component Value Date/Time   COLORURINE YELLOW 11/03/2015 1934   APPEARANCEUR CLEAR 11/03/2015 1934   LABSPEC <1.005 (L) 11/03/2015 1934   PHURINE 7.0 11/03/2015 1934   GLUCOSEU NEGATIVE  11/03/2015 1934   GLUCOSEU NEGATIVE 06/19/2014 0941   HGBUR TRACE (A) 11/03/2015 1934   BILIRUBINUR NEGATIVE 11/03/2015 1934   BILIRUBINUR negative 09/20/2015 1946   KETONESUR NEGATIVE 11/03/2015 1934   PROTEINUR NEGATIVE 11/03/2015 1934   UROBILINOGEN 0.2 09/20/2015 1946   UROBILINOGEN 0.2 09/17/2014 2215   NITRITE POSITIVE (A) 11/03/2015 1934   LEUKOCYTESUR MODERATE (A) 11/03/2015 1934   Sepsis Labs: @LABRCNTIP (procalcitonin:4,lacticidven:4)  ) Recent Results (from the past 240 hour(s))  Urine culture     Status: Abnormal   Collection Time: 11/03/15  7:34 PM  Result Value Ref Range Status   Specimen Description URINE, RANDOM  Final   Special Requests NONE  Final   Culture >=100,000 COLONIES/mL KLEBSIELLA PNEUMONIAE (A)  Final   Report Status 11/06/2015 FINAL  Final   Organism ID, Bacteria KLEBSIELLA PNEUMONIAE (A)  Final      Susceptibility   Klebsiella pneumoniae - MIC*    AMPICILLIN >=32 RESISTANT Resistant     CEFAZOLIN <=4 SENSITIVE Sensitive     CEFTRIAXONE <=1 SENSITIVE Sensitive     CIPROFLOXACIN <=0.25 SENSITIVE Sensitive     GENTAMICIN <=1 SENSITIVE Sensitive      IMIPENEM <=0.25 SENSITIVE Sensitive     NITROFURANTOIN 64 INTERMEDIATE Intermediate     TRIMETH/SULFA <=20 SENSITIVE Sensitive     AMPICILLIN/SULBACTAM 4 SENSITIVE Sensitive     PIP/TAZO <=4 SENSITIVE Sensitive     * >=100,000 COLONIES/mL KLEBSIELLA PNEUMONIAE  C difficile quick scan w PCR reflex     Status: None   Collection Time: 11/04/15  2:07 AM  Result Value Ref Range Status   C Diff antigen NEGATIVE NEGATIVE Final   C Diff toxin NEGATIVE NEGATIVE Final   C Diff interpretation No C. difficile detected.  Final      Radiology Studies: Dg Chest 2 View  Result Date: 11/04/2015 CLINICAL DATA:  Cough and SOB x1 month. Pt denies chest pain. Hx of 3 cardiac stents and open heart surgery, pt is unsure when these surgeries occurred. Hx of asthma, CAD, COPD, CHF. Ex-smoker, pt unsure how long ago he quit. EXAM: CHEST  2 VIEW COMPARISON:  10/17/2015 FINDINGS: Status post median sternotomy and CABG. The heart is upper limits normal in size. There are no focal consolidations or pleural effusions. No pulmonary edema. IMPRESSION: No active cardiopulmonary disease. Electronically Signed   By: Nolon Nations M.D.   On: 11/04/2015 14:40  Ct Abdomen Pelvis W Contrast  Result Date: 11/05/2015 CLINICAL DATA:  82 year old male with diarrhea and hyponatremia. EXAM: CT ABDOMEN AND PELVIS WITH CONTRAST TECHNIQUE: Multidetector CT imaging of the abdomen and pelvis was performed using the standard protocol following bolus administration of intravenous contrast. CONTRAST:  163mL ISOVUE-300 IOPAMIDOL (ISOVUE-300) INJECTION 61% COMPARISON:  Abdominal radiograph dated 02/23/2015 FINDINGS: The visualized lung bases are clear. There is coronary vascular calcification. No intra-abdominal free air or free fluid. Multiple hepatic hypodense lesions with fluid attenuation measuring up to 2 cm in the left lobe of the liver appear to have been present on the chest CT dated 12/09/2013. These lesions are incompletely  characterized on the CT but likely represent cysts. MRI may provide better characterization. Cholecystectomy. There aretwo small calcification along the extrahepatic biliary tree measuring up to 6 mm which may represent chronic calcification versus less likely retained stone within the CBD. There is no biliary ductal dilatation. The pancreas, spleen, adrenal glands, appear unremarkable. There is mild bilateral renal atrophy. There is a 2.5 cm right renal cyst. Right renal inferior pole scarring noted.  There is no hydronephrosis on either side. The visualized ureters and urinary bladder appear unremarkable. The prostate and seminal vesicles are grossly unremarkable. There is sigmoid diverticulosis with muscular hypertrophy. Mild thickened appearance of the sigmoid colon without surrounding inflammatory changes, may be chronic. Clinical correlation is recommended to exclude mild colitis. There is no evidence of bowel obstruction or definite active inflammation. There is a small hiatal hernia. The appendix herniates into the right inguinal canal and appears unremarkable. There is advanced aortoiliac atherosclerotic disease. There is atherosclerotic calcification of the origins of the celiac axis, SMA, IMA with severe narrowing of the origins of the SMA and celiac axis. These vessels however remain patent. The splenic vein, SMV, and main portal vein are patent. No portal venous gas identified. There is no adenopathy. There are bilateral fat containing inguinal hernias, right greater than left. The right inguinal hernia also contains the appendix. There is a small fat containing umbilical hernia. The abdominal wall soft tissues are otherwise unremarkable. No fluid collection. There is osteopenia with degenerative changes of the spine. Multilevel disc desiccation with vacuum phenomena. No acute fracture. Focal sclerotic lesion in the right iliac bone most likely benign and a bone island. IMPRESSION: No definite acute  intra-abdominal or pelvic pathology. Sigmoid diverticulosis. Thickened appearance of the distal descending colon and sigmoid, may be chronic. Clinical correlation is recommended to evaluate for mild colitis. No bowel obstruction. The appendix is located in the right inguinal hernia and appears unremarkable. Nonacute findings as described above. Electronically Signed   By: Anner Crete M.D.   On: 11/05/2015 04:19    Scheduled Meds: . apixaban  5 mg Oral BID  . bisoprolol  20 mg Oral Daily  . calcium-vitamin D  1 tablet Oral BID WC  . cefTRIAXone (ROCEPHIN)  IV  1 g Intravenous QHS  . cholestyramine  8 g Oral QPC lunch  . clopidogrel  75 mg Oral Q breakfast  . diphenoxylate-atropine  1 tablet Oral QID  . famotidine  20 mg Oral BID  . irbesartan  300 mg Oral Daily  . isosorbide mononitrate  60 mg Oral BID  . latanoprost  1 drop Both Eyes QHS  . LORazepam  0.5 mg Oral BID  . mometasone-formoterol  2 puff Inhalation BID  . montelukast  10 mg Oral QHS  . pravastatin  80 mg Oral q1800  . saccharomyces boulardii  250 mg Oral BID  . sodium chloride flush  3 mL Intravenous Q12H  . vitamin B-12  1,000 mcg Oral Daily   Continuous Infusions: . sodium chloride 75 mL/hr at 11/06/15 0300     LOS: 0 days   Time Spent in minutes   30 minutes  [name] D.O. on 11/06/2015 at 10:35 AM  Between 7am to 7pm - Pager - [phone]  After 7pm go to [email or web address] - password [email or web address]  And look for the night coverage person covering for me after hours  [facility] Office  [phone]

## 2015-11-07 DIAGNOSIS — I5032 Chronic diastolic (congestive) heart failure: Secondary | ICD-10-CM | POA: Diagnosis not present

## 2015-11-07 DIAGNOSIS — F329 Major depressive disorder, single episode, unspecified: Secondary | ICD-10-CM

## 2015-11-07 DIAGNOSIS — E871 Hypo-osmolality and hyponatremia: Secondary | ICD-10-CM | POA: Diagnosis not present

## 2015-11-07 DIAGNOSIS — D509 Iron deficiency anemia, unspecified: Secondary | ICD-10-CM | POA: Diagnosis not present

## 2015-11-07 DIAGNOSIS — K529 Noninfective gastroenteritis and colitis, unspecified: Secondary | ICD-10-CM | POA: Diagnosis not present

## 2015-11-07 DIAGNOSIS — J449 Chronic obstructive pulmonary disease, unspecified: Secondary | ICD-10-CM | POA: Diagnosis not present

## 2015-11-07 LAB — BASIC METABOLIC PANEL
Anion gap: 7 (ref 5–15)
BUN: 12 mg/dL (ref 6–20)
CHLORIDE: 96 mmol/L — AB (ref 101–111)
CO2: 30 mmol/L (ref 22–32)
CREATININE: 0.99 mg/dL (ref 0.61–1.24)
Calcium: 8.9 mg/dL (ref 8.9–10.3)
GFR calc Af Amer: >60 mL/min (ref >60)
GFR calc non Af Amer: >60 mL/min (ref >60)
Glucose, Bld: 98 mg/dL (ref 65–99)
POTASSIUM: 3.9 mmol/L (ref 3.5–5.1)
SODIUM: 133 mmol/L — AB (ref 135–145)

## 2015-11-07 MED ORDER — DIPHENOXYLATE-ATROPINE 2.5-0.025 MG PO TABS: 1.0000 | ORAL_TABLET | Freq: Four times a day (QID) | ORAL | 0 refills | Status: DC | PRN

## 2015-11-07 MED ORDER — CEFUROXIME AXETIL 250 MG PO TABS: 250.0000 mg | ORAL_TABLET | Freq: Two times a day (BID) | ORAL | 0 refills | Status: DC

## 2015-11-07 NOTE — Discharge Summary (Signed)
Physician Discharge Summary  [name]. [id number] DOB: 12-23-1930 DOA: 11/03/2015  PCP: Binnie Rail, MD  Admit date: 11/03/2015 Discharge date: 11/07/2015  Time spent: 45 minutes  Recommendations for Outpatient Follow-up:  Patient will be discharged to home.  Patient will need to follow up with primary care provider within one week of discharge, repeat BMP in one week. Follow up with gastroenterology.  Patient should continue medications as prescribed.  Patient should follow a heart healthy diet.   Discharge Diagnoses:  Hyponatremia Diarrhea  UTI PAF Hyperlipidemia Coronary atherosclerosis Asthma with COPD ([place]) GERD Chronic diastolic heart failure [name]) Essential hypertension   Discharge Condition: Stable  Diet recommendation: Heart healhty  Filed Weights   11/04/15 0022 11/06/15 0421 11/07/15 0401  Weight: 65.2 kg (143 lb 12.8 oz) 64.5 kg (142 lb 4.8 oz) 63.9 kg (140 lb 12.8 oz)    History of present illness:  on 11/03/2015 by Dr. Caleb Popp Stephen Hickmanis a 82 y.o.malewith medical history significant of hypertension, hyperlipidemia, COPD, asthma, CAD, s/p of stent, OSA on CPAP, PAF on Eliquis, CKD-II, dCHF, PVD, who presents with dysuria, diarrhea, hyponatremia.  Patient reports that he has been having diarrhea for almost 3 weeks. He has 5-6 watery bowel movements each day. He has mild dizziness. No nausea, vomiting or abdominal pain. No fever or chills. He also has some mild dysuria, no burning on urination. Patient has mild cough due to asthma and COPD, which is at baseline. No chest pain or SOB. Patient does not have unilateral weakness, hematuria or rash. Pt was seen by his PCP and was found to have "very low sodium" and was seen here for further evaluation and treatment.  Hickman Course:  Hyponatremia -Sodium upon admission 120 , currently 130 -This is most likely due to diarrhea and continuation of diuretics vs acute on chronic.  After  reviewing records, patient has had low sodium in the past. Mental status normal.  -Continue IVF and monitor BMP -TSH 1.764 (10/17/2015)  Diarrhea  -patient has 5 to 6 watery bowel movements each day.  -Etiology is not clear.  -Patient is not taking laxatives  -Cdiff negative -Gastroenterology consulted and appreciated -CT abd/pelvis: sigmoid colon thickening, no acute findings -Flex sig 11/05/15: moderate diverticulosis in sigmoid and descending colon, biopsy taken and pending -Recommended lomotil 3-4 times per day PRN -Follow with Gastroenterology in one month  UTI -patient has dysuria and a positive urinalysis, consistent with UTI. Patient is not septic on admission. Hemodynamically stable. -Urine culture: >100K GNR- Klebsiella pneumoniae  -Initially placed on ceftriaxone -Will discharge with Ceftin for additional 3 days (for total of 7 days of antibiotics)  PAF -CHA2DS2-VASc 5  -Continue Eliquis -Heart rate controlled, continue Zebeta  Hyperlipidemia -Last LDL was72 on 2/10 and 2/17  -Continue statin  Coronary atherosclerosis -s/p of stent 2012 -Currently no chest pain -continue Plavix, Zebeta, pravastatin, Imdur  Asthma with COPD ([place]) -stable, no wheezing, however, does have cough -Continue Albuterol nebulizer PRN, singulair, Dulerainhaler, prn Mucinex for cough -CXR unremarkable for infection  GERD -Protonix held due to diarrhea, but can continue on discharge -Per wife protonix has not really helped  -continue Pepcid for daily to 20 mg bid  Chronic diastolic heart failure ([place]) -Echocardiogram 09/24/15 showed EF 60-65 percent.  -Currently euvolemic/compensated -torsemide and spironolactone held due to hyponatremia, will restart today and monitor closely  -BNP 245.7  Essential hypertension  -continue Irbesartan and zebeta -IV hydralazine when necessary   Consultants Gastroenterology  Procedures  Flexible Sigmoidoscopy  Discharge  Exam: Vitals:   11/06/15 2200 11/07/15 0401  BP: (!) 158/74 (!) 141/62  Pulse: 74 79  Resp: 18   Temp: 98.2 F (36.8 C) 97.8 F (36.6 C)    Exam  General: Well developed, well nourished, NAD, appears stated age  HEENT: NCAT, mucous membranes moist.   Cardiovascular: S1 S2 auscultated, no murmurs, RRR  Respiratory: Clear to auscultation bilaterally with equal chest rise, occ cough  Abdomen: Soft, nontender, nondistended, + bowel sounds  Extremities: warm dry without cyanosis clubbing or edema  Neuro: AAOx3, nonfocal  Psych: Normal affect and demeanor with intact judgement and insight, pleasant  Discharge Instructions  Discharge Instructions    AMB Referral to [facility] Management    Complete by:  As directed   Reason for consult:  Frequent Hickman visits in past month;  Post Hickman monitoring   Diagnoses of:  Heart Failure   Expected date of contact:  1-3 days (reserved for Hickman discharges)   Please assign to community nurse for transition of care calls and assess for home visits. Questions please call:   Natividad Brood, RN BSN [facility] Liaison  [phone] business mobile phone Toll free office [phone]   Discharge instructions    Complete by:  As directed   Patient will be discharged to home.  Patient will need to follow up with primary care provider within one week of discharge, repeat BMP in one week. Follow up with gastroenterology.  Patient should continue medications as prescribed.  Patient should follow a heart healthy diet.       Medication List    TAKE these medications   acetaminophen 325 MG tablet Commonly known as:  TYLENOL Take 650 mg by mouth every 6 (six) hours as needed for moderate pain or headache.   albuterol 108 (90 Base) MCG/ACT inhaler Commonly known as:  PROAIR HFA Inhale 2 puffs into the lungs every 6 (six) hours as needed for wheezing or shortness of breath.   apixaban 5 MG Tabs tablet Commonly known  as:  ELIQUIS Take 1 tablet (5 mg total) by mouth 2 (two) times daily.   bisoprolol 10 MG tablet Commonly known as:  ZEBETA Take 2 tablets (20 mg total) by mouth daily.   calcium-vitamin D 500-200 MG-UNIT tablet Commonly known as:  OSCAL WITH D Take 1 tablet by mouth 2 (two) times daily.   cefUROXime 250 MG tablet Commonly known as:  CEFTIN Take 1 tablet (250 mg total) by mouth 2 (two) times daily with a meal.   cholestyramine 4 g packet Commonly known as:  QUESTRAN MIX AND DRINK 1 PACKET BY MOUTH TWICE A DAY IF NEEDED FOR LOOSE STOOLS What changed:  how much to take  how to take this  when to take this  additional instructions   clopidogrel 75 MG tablet Commonly known as:  PLAVIX Take 1 tablet (75 mg total) by mouth daily with breakfast.   dexlansoprazole 60 MG capsule Commonly known as:  DEXILANT Take 1 capsule (60 mg total) by mouth 2 (two) times daily before a meal.   diphenoxylate-atropine 2.5-0.025 MG tablet Commonly known as:  LOMOTIL Take 1 tablet by mouth 4 (four) times daily as needed for diarrhea or loose stools. Only take if you are having diarrhea or loose stools.   famotidine 20 MG tablet Commonly known as:  PEPCID Take 1 tablet (20 mg total) by mouth daily.   ipratropium-albuterol 0.5-2.5 (3) MG/3ML Soln Commonly known as:  DUONEB  USE ONE VIAL VIA NEBULIZER EVERY 6 HOURS AS NEEDED FOR SHORTNESS OF BREATH What changed:  See the new instructions.   isosorbide mononitrate 60 MG 24 hr tablet Commonly known as:  IMDUR Take 1 tablet (60 mg total) by mouth 2 (two) times daily.   latanoprost 0.005 % ophthalmic solution Commonly known as:  XALATAN Place 1 drop into both eyes at bedtime.   LORazepam 0.5 MG tablet Commonly known as:  ATIVAN Take 0.5 mg by mouth 2 (two) times daily.   mometasone-formoterol 100-5 MCG/ACT Aero Commonly known as:  DULERA Inhale 2 puffs into the lungs 2 (two) times daily. Rinse mouth after use   montelukast 10 MG  tablet Commonly known as:  SINGULAIR Take 1 tablet (10 mg total) by mouth at bedtime.   nitroGLYCERIN 0.4 MG SL tablet Commonly known as:  NITROSTAT Place 1 tablet (0.4 mg total) under the tongue every 5 (five) minutes as needed for chest pain.   pravastatin 80 MG tablet Commonly known as:  PRAVACHOL Take 1 tablet (80 mg total) by mouth every morning. What changed:  when to take this   [place] into the lungs at bedtime. CPAP/ 12   PRESCRIPTION MEDICATION Inject 1 each into the skin once a week. Allergy shots from Dr. Annamaria Boots (1:10) - self-administered - usually Thursday or Friday   PROBIOTIC DAILY Caps Take 1 capsule by mouth 2 (two) times daily.   RA VITAMIN B-12 TR 1000 MCG Tbcr Generic drug:  Cyanocobalamin take 1 tablet by mouth once daily   spironolactone 25 MG tablet Commonly known as:  ALDACTONE Take 1 tablet (25 mg total) by mouth daily.   torsemide 20 MG tablet Commonly known as:  DEMADEX Take 1 tablet (20 mg total) by mouth daily.   traZODone 50 MG tablet Commonly known as:  DESYREL Take 1 tablet (50 mg total) by mouth at bedtime as needed for sleep.   valsartan 320 MG tablet Commonly known as:  DIOVAN Take 1 tablet (320 mg total) by mouth daily.      Allergies  Allergen Reactions  . Ace Inhibitors Other (See Comments)    Severe asthma (COPD)  . Avelox [Moxifloxacin Hcl In Nacl] Other (See Comments)    Gum pain  . Tape Other (See Comments)    Adhesive and tape reaction-tears skin off   . Lidocaine Other (See Comments)    Possible reaction? Dizziness, confusion, syncope  . Morphine And Related Nausea And Vomiting  . Codeine Nausea And Vomiting  . Lasix [Furosemide] Itching and Swelling  . Other Other (See Comments)    Maple trees, allergy symptoms   Follow-up Information    Scarlette Shorts, MD Follow up on 12/20/2015.   Specialty:  Gastroenterology Why:  2:45 PM.  for follow up of diarrhea with GI doctor.  Contact  information: [address] [place] [address] [phone]        Binnie Rail, MD. Schedule an appointment as soon as possible for a visit in 1 week(s).   Specialty:  Internal Medicine Why:  Hickman follow up Contact information: [place] [address] [phone]            The results of significant diagnostics from this hospitalization (including imaging, microbiology, ancillary and laboratory) are listed below for reference.    Significant Diagnostic Studies: Dg Chest 2 View  Result Date: 11/04/2015 CLINICAL DATA:  Cough and SOB x1 month. Pt denies chest pain. Hx of 3 cardiac stents  and open heart surgery, pt is unsure when these surgeries occurred. Hx of asthma, CAD, COPD, CHF. Ex-smoker, pt unsure how long ago he quit. EXAM: CHEST  2 VIEW COMPARISON:  10/17/2015 FINDINGS: Status post median sternotomy and CABG. The heart is upper limits normal in size. There are no focal consolidations or pleural effusions. No pulmonary edema. IMPRESSION: No active cardiopulmonary disease. Electronically Signed   By: Nolon Nations M.D.   On: 11/04/2015 14:40  Ct Abdomen Pelvis W Contrast  Result Date: 11/05/2015 CLINICAL DATA:  82 year old male with diarrhea and hyponatremia. EXAM: CT ABDOMEN AND PELVIS WITH CONTRAST TECHNIQUE: Multidetector CT imaging of the abdomen and pelvis was performed using the standard protocol following bolus administration of intravenous contrast. CONTRAST:  122mL ISOVUE-300 IOPAMIDOL (ISOVUE-300) INJECTION 61% COMPARISON:  Abdominal radiograph dated 02/23/2015 FINDINGS: The visualized lung bases are clear. There is coronary vascular calcification. No intra-abdominal free air or free fluid. Multiple hepatic hypodense lesions with fluid attenuation measuring up to 2 cm in the left lobe of the liver appear to have been present on the chest CT dated 12/09/2013. These lesions are incompletely characterized on the CT but likely represent cysts. MRI  may provide better characterization. Cholecystectomy. There aretwo small calcification along the extrahepatic biliary tree measuring up to 6 mm which may represent chronic calcification versus less likely retained stone within the CBD. There is no biliary ductal dilatation. The pancreas, spleen, adrenal glands, appear unremarkable. There is mild bilateral renal atrophy. There is a 2.5 cm right renal cyst. Right renal inferior pole scarring noted. There is no hydronephrosis on either side. The visualized ureters and urinary bladder appear unremarkable. The prostate and seminal vesicles are grossly unremarkable. There is sigmoid diverticulosis with muscular hypertrophy. Mild thickened appearance of the sigmoid colon without surrounding inflammatory changes, may be chronic. Clinical correlation is recommended to exclude mild colitis. There is no evidence of bowel obstruction or definite active inflammation. There is a small hiatal hernia. The appendix herniates into the right inguinal canal and appears unremarkable. There is advanced aortoiliac atherosclerotic disease. There is atherosclerotic calcification of the origins of the celiac axis, SMA, IMA with severe narrowing of the origins of the SMA and celiac axis. These vessels however remain patent. The splenic vein, SMV, and main portal vein are patent. No portal venous gas identified. There is no adenopathy. There are bilateral fat containing inguinal hernias, right greater than left. The right inguinal hernia also contains the appendix. There is a small fat containing umbilical hernia. The abdominal wall soft tissues are otherwise unremarkable. No fluid collection. There is osteopenia with degenerative changes of the spine. Multilevel disc desiccation with vacuum phenomena. No acute fracture. Focal sclerotic lesion in the right iliac bone most likely benign and a bone island. IMPRESSION: No definite acute intra-abdominal or pelvic pathology. Sigmoid diverticulosis.  Thickened appearance of the distal descending colon and sigmoid, may be chronic. Clinical correlation is recommended to evaluate for mild colitis. No bowel obstruction. The appendix is located in the right inguinal hernia and appears unremarkable. Nonacute findings as described above. Electronically Signed   By: Anner Crete M.D.   On: 11/05/2015 04:19  Dg Chest Portable 1 View  Result Date: 10/17/2015 CLINICAL DATA:  Severe bradycardia today.  Weakness. EXAM: PORTABLE CHEST 1 VIEW COMPARISON:  09/25/2015 FINDINGS: External pacer paddles are noted. The cardiac silhouette, mediastinal and hilar contours are within normal limits the and stable, given the AP projection and portable technique. Stable surgical changes from triple bypass surgery. The  lungs are clear. No pleural effusion. The bony thorax is intact. IMPRESSION: No acute cardiopulmonary findings. Electronically Signed   By: Marijo Sanes M.D.   On: 10/17/2015 16:13    Microbiology: Recent Results (from the past 240 hour(s))  Urine culture     Status: Abnormal   Collection Time: 11/03/15  7:34 PM  Result Value Ref Range Status   Specimen Description URINE, RANDOM  Final   Special Requests NONE  Final   Culture >=100,000 COLONIES/mL KLEBSIELLA PNEUMONIAE (A)  Final   Report Status 11/06/2015 FINAL  Final   Organism ID, Bacteria KLEBSIELLA PNEUMONIAE (A)  Final      Susceptibility   Klebsiella pneumoniae - MIC*    AMPICILLIN >=32 RESISTANT Resistant     CEFAZOLIN <=4 SENSITIVE Sensitive     CEFTRIAXONE <=1 SENSITIVE Sensitive     CIPROFLOXACIN <=0.25 SENSITIVE Sensitive     GENTAMICIN <=1 SENSITIVE Sensitive     IMIPENEM <=0.25 SENSITIVE Sensitive     NITROFURANTOIN 64 INTERMEDIATE Intermediate     TRIMETH/SULFA <=20 SENSITIVE Sensitive     AMPICILLIN/SULBACTAM 4 SENSITIVE Sensitive     PIP/TAZO <=4 SENSITIVE Sensitive     * >=100,000 COLONIES/mL KLEBSIELLA PNEUMONIAE  C difficile quick scan w PCR reflex     Status: None    Collection Time: 11/04/15  2:07 AM  Result Value Ref Range Status   C Diff antigen NEGATIVE NEGATIVE Final   C Diff toxin NEGATIVE NEGATIVE Final   C Diff interpretation No C. difficile detected.  Final     Labs: Basic Metabolic Panel:  Recent Labs Lab 11/04/15 1347 11/04/15 1952 11/05/15 0740 11/06/15 0403 11/07/15 0222  NA 127* 125* 128* 130* 133*  K 4.0 4.7 4.4 4.1 3.9  CL 96* 95* 97* 99* 96*  CO2 25 25 24 25 30   GLUCOSE 87 100* 100* 102* 98  BUN 10 11 8 8 12   CREATININE 0.95 0.97 0.78 0.78 0.99  CALCIUM 8.4* 8.5* 8.8* 8.5* 8.9   Liver Function Tests:  Recent Labs Lab 11/03/15 1420 11/03/15 1926  AST 15 18  ALT 14 17  ALKPHOS 49 53  BILITOT 0.5 0.5  PROT 6.6 6.3*  ALBUMIN 3.7 3.2*   No results for input(s): LIPASE, AMYLASE in the last 168 hours. No results for input(s): AMMONIA in the last 168 hours. CBC:  Recent Labs Lab 11/03/15 1420 11/03/15 1926 11/04/15 0827 11/05/15 0532 11/06/15 0403  WBC 11.4* 11.7* 8.7 7.3 5.2  NEUTROABS 8.7* 8.6*  --   --   --   HGB 11.6* 11.3* 11.4* 10.4* 9.9*  HCT 33.8* 32.2* 33.6* 30.4* 30.3*  MCV 88.0 86.1 86.8 86.9 89.1  PLT 259.0 220 216 203 219   Cardiac Enzymes: No results for input(s): CKTOTAL, CKMB, CKMBINDEX, TROPONINI in the last 168 hours. BNP: BNP (last 3 results)  Recent Labs  09/25/15 0953 10/17/15 1740 11/03/15 2253  BNP 345.5* 150.2* 245.7*    ProBNP (last 3 results) No results for input(s): PROBNP in the last 8760 hours.  CBG: No results for input(s): GLUCAP in the last 168 hours.     SignedCristal Ford  [facility] 11/07/2015, 10:26 AM

## 2015-11-07 NOTE — Discharge Summary (Signed)
Pt got discharged to home, discharge instructions provided and patient showed understanding to it, IV taken out,Telemonitor DC,pt left unit in wheelchair with all of the belongings accompanied with a family member (children)

## 2015-11-08 ENCOUNTER — Telehealth: Payer: Self-pay | Admitting: *Deleted

## 2015-11-08 ENCOUNTER — Encounter: Payer: Self-pay | Admitting: Internal Medicine

## 2015-11-08 ENCOUNTER — Other Ambulatory Visit: Payer: Self-pay | Admitting: *Deleted

## 2015-11-08 NOTE — Patient Outreach (Signed)
[facility]) Care Management  11/08/2015  Stephen Macadams [place] Jr. 07-28-1930 [id number]   Transition of care  RN spoke with pt's wife [name]) who is consented due to pt is very [facility] and can not hear over the telephone. RN introduced the [facility] program and services and completed the [facility] template. Due discussed the pt's medical issues and available benefits of the [facility] program and services. Completed a review and confirmed pt's current discharge medications and sufficient transportation to all scheduled appointments review and mentioned today via EPIC verification. Also verified pt has an upcoming appointments with his primary provider on this week.  Wife indicated pt currently having issues with his GI concerning diarrhea and has a pending appointment to consult a spiecalist soon. Currently indicates pt awaiting approval for a new GI medication not approved by his insurance. Therefore pt's provider is working on Catering manager for possible approval (Teir issues). Currently pt is taking the medication Pepcid until approval on the pending medication. RN offered to assist if this process is delayed.  Breifly discussed other medical issues that pt has COPD and HF and how pt is managing his medical issues. Also confirmed pt/spouse knowledge base related to pt's medications such as his diuretics and any fluid retention that may affect the pt's breathing. Discussed early intervention as a prevention measure to avoid re-admission related to the above medical issues. Wife with understanding of the above as a plan of care was discussed and generated. RN offered community home visit to education pt and wife further concerning some of pt's medical issues such as COPD and HF. Wife declined however receptive to the importance of weekly transition of care calls over the next few weeks. Will re-evaluate pt's adherence with managing his COPD/ HF and attending all appointments and taking all medications. Will scheduled a  follow up call next week as agreed for ongoing transition of care.   Patient was recently discharged from hospital and all medications have been reviewed.  Stephen Mina, RN Care Management Coordinator [place] Office [phone]

## 2015-11-08 NOTE — Telephone Encounter (Signed)
Transition Care Management Follow-up Telephone Call   Date discharged? 11/07/15  How have you been since you were released from the hospital? spoke w/pt wife she states he seem to doing alright   Do you understand why you were in the hospital? YES   Do you understand the discharge instructions? YES   Where were you discharged to? Home   Items Reviewed:  Medications reviewed: YES  Allergies reviewed: YES  Dietary changes reviewed: YES, heart healthy  Referrals reviewed: No referral needed    Functional Questionnaire:   Activities of Daily Living (ADLs):   She states he are independent in the following: ambulation, bathing and hygiene, feeding, continence, grooming, toileting and dressing States he require assistance with the following: ambulation   Any transportation issues/concerns?: NO   Any patient concerns? YES   Confirmed importance and date/time of follow-up visits scheduled YES, appt 11/11/15  Provider Appointment booked with Dr. Quay Burow  Confirmed with patient if condition begins to worsen call PCP or go to the ER.  Patient was given the office number and encouraged to call back with question or concerns.  : YES

## 2015-11-11 ENCOUNTER — Other Ambulatory Visit (INDEPENDENT_AMBULATORY_CARE_PROVIDER_SITE_OTHER): Payer: Medicare Other

## 2015-11-11 ENCOUNTER — Ambulatory Visit (INDEPENDENT_AMBULATORY_CARE_PROVIDER_SITE_OTHER): Payer: Medicare Other | Admitting: Internal Medicine

## 2015-11-11 ENCOUNTER — Encounter: Payer: Self-pay | Admitting: Internal Medicine

## 2015-11-11 ENCOUNTER — Telehealth: Payer: Self-pay | Admitting: *Deleted

## 2015-11-11 VITALS — BP 178/80 | HR 100 | Temp 98.2°F | Ht 62.0 in | Wt 149.4 lb

## 2015-11-11 DIAGNOSIS — E871 Hypo-osmolality and hyponatremia: Secondary | ICD-10-CM

## 2015-11-11 DIAGNOSIS — K4020 Bilateral inguinal hernia, without obstruction or gangrene, not specified as recurrent: Secondary | ICD-10-CM | POA: Diagnosis not present

## 2015-11-11 DIAGNOSIS — N390 Urinary tract infection, site not specified: Secondary | ICD-10-CM

## 2015-11-11 DIAGNOSIS — R32 Unspecified urinary incontinence: Secondary | ICD-10-CM

## 2015-11-11 DIAGNOSIS — R197 Diarrhea, unspecified: Secondary | ICD-10-CM

## 2015-11-11 DIAGNOSIS — K219 Gastro-esophageal reflux disease without esophagitis: Secondary | ICD-10-CM

## 2015-11-11 DIAGNOSIS — K4090 Unilateral inguinal hernia, without obstruction or gangrene, not specified as recurrent: Secondary | ICD-10-CM | POA: Insufficient documentation

## 2015-11-11 LAB — COMPREHENSIVE METABOLIC PANEL
ALBUMIN: 3.8 g/dL (ref 3.5–5.2)
ALK PHOS: 47 U/L (ref 39–117)
ALT: 13 U/L (ref 0–53)
AST: 18 U/L (ref 0–37)
BILIRUBIN TOTAL: 0.6 mg/dL (ref 0.2–1.2)
BUN: 12 mg/dL (ref 6–23)
CALCIUM: 9.3 mg/dL (ref 8.4–10.5)
CHLORIDE: 90 meq/L — AB (ref 96–112)
CO2: 31 mEq/L (ref 19–32)
CREATININE: 0.77 mg/dL (ref 0.40–1.50)
GFR: 102.62 mL/min (ref >60.00)
Glucose, Bld: 99 mg/dL (ref 70–99)
Potassium: 3.8 mEq/L (ref 3.5–5.1)
SODIUM: 126 meq/L — AB (ref 135–145)
TOTAL PROTEIN: 6.6 g/dL (ref 6.0–8.3)

## 2015-11-11 LAB — POC URINALSYSI DIPSTICK (AUTOMATED)
BILIRUBIN UA: NEGATIVE
GLUCOSE UA: NEGATIVE
Ketones, UA: NEGATIVE
LEUKOCYTES UA: NEGATIVE
NITRITE UA: NEGATIVE
Protein, UA: NEGATIVE
RBC UA: NEGATIVE
Spec Grav, UA: 1.015
UROBILINOGEN UA: 0.2
pH, UA: 6.5

## 2015-11-11 LAB — PANCREATIC ELASTASE, FECAL: Pancreatic Elastase-1, Stool: >500 ug Elast./g (ref >200)

## 2015-11-11 MED ORDER — RANITIDINE HCL 150 MG PO TABS: 150.0000 mg | ORAL_TABLET | Freq: Two times a day (BID) | ORAL | 3 refills | Status: DC

## 2015-11-11 MED ORDER — DEXLANSOPRAZOLE 60 MG PO CPDR: 60.0000 mg | DELAYED_RELEASE_CAPSULE | Freq: Every day | ORAL | 3 refills | Status: DC

## 2015-11-11 NOTE — Telephone Encounter (Signed)
Rec'd call from lab stating tht someone brought down a urine specimen, but there was not any orders put in. Per chart MD check UA up here and was adding urine culture, Inform lab will place order...[name]

## 2015-11-11 NOTE — Assessment & Plan Note (Signed)
C. difficile was negative Flex sigmoidoscopy done in the hospital Continue cholestyramine Has not needed Lomotil Has GI follow-up

## 2015-11-11 NOTE — Progress Notes (Signed)
Pre visit review using our clinic review tool, if applicable. No additional management support is needed unless otherwise documented below in the visit note. 

## 2015-11-11 NOTE — Assessment & Plan Note (Signed)
Urine dip here today negative for infection We'll check culture Explained to him that his symptoms are not likely related to infection and could be related to an enlarged prostate more left inguinal hernia Declined Flomax or urology referral at this time Will monitor symptoms and if they do not resolve or get worse he will need further evaluation or treatment

## 2015-11-11 NOTE — Assessment & Plan Note (Signed)
Seen on CT scan He does have some left-sided inguinal discomfort Consider surgery evaluation. He will monitor symptoms for now

## 2015-11-11 NOTE — Assessment & Plan Note (Signed)
Having daily symptoms Has tried several medications in the past We'll try dexilant once daily. Zantac 150 mg twice daily Has GI appointment for further evaluation and a couple of weeks

## 2015-11-11 NOTE — Progress Notes (Signed)
Subjective:    Patient ID: [name]., male    DOB: 01/19/1931, 82 y.o.   MRN: [id number]  HPI He is here for follow up.  I advised him to go to the hospital after his last visit here on 7/26 due to hyponatremia.  He had been having diarrhea for 3 weeks.  He was having 5-6 watery stools a day.  He had dizziness, weakness, mild dysuria, mild cough.  His sodium in the ED was 120 and his urinalysis was positive for an infection.  He received IV rocephin for the UTI.  He was discharged on Ceftin for 3 more days. GI was consulted regarding the diarrhea.  He received IVF.  His sodium improved to 130.  His mental status remained normal the entire hospitalization.  His Cdiff was negative.  A Flex sigmoidoscopy was done.  GI advised conitnuing cholestryramine 4 g daily.  Lomotil 1 tab 3-4 times daily prn for loose stools/diarrhea.  His diuretics were held during his hospital stay and restarted upon discharge.    Flex sigmoidoscopy 11/05/15:  Moderate diverticulosis in sigmoid and desc colon.  Normal mucosa in rectum, sigmoid and desc colon.    UTI:  He completed the antibiotics (7 days total), but did not feel that they helped.   He has difficulty urinating and has to force his urination, which is new - he did not have this prior to the hospital.  He has frequent urination.  He has stinging on the left groin and discomfort at times - this is not new, but he is unsure how long it has been there. He denies dysuria.  A ct scan of the abdomen/pelvis did show Bilateral inguinal hernias. He states he did have prostate surgery in the past, but there is no documentation of this in the chart.  He believes this was for an enlarged prostate.  Diarrhea: the diarrhea stopped while he was in the hospital.  He is taking the cholestyramine and has not need the lomotil.  He has not needed the lomitil.  He denies any diarrhea, blood in stool or abdominal pain at this time. He has not had any fevers.  He is walking  every morning and his strength is improving. He would like to continue with physical therapy next week.  GERD:  He is taking his medication daily as prescribed.  He has GERD symptoms daily.  He has been on several medications in the past and they have not helped.   He is scheduled to see GI   Medications and allergies reviewed with patient and updated if appropriate.  Patient Active Problem List   Diagnosis Date Noted  . Chronic diarrhea   . Depression with anxiety   . UTI (lower urinary tract infection) 11/03/2015  . CKD (chronic kidney disease), stage II 10/21/2015  . Diarrhea 10/21/2015  . Symptomatic bradycardia 10/17/2015  . Chronic diastolic CHF (congestive heart failure), NYHA class 2 ([place])   . COPD (chronic obstructive pulmonary disease) ([place]) 09/23/2015  . COPD exacerbation ([place]) 09/23/2015  . Sleeping difficulties 09/08/2015  . Cerebral embolism with cerebral infarction 05/18/2015  . Coronary artery disease involving native coronary artery of native heart   . Numbness and tingling in right hand 05/17/2015  . Hyponatremia 05/17/2015  . Essential hypertension   . Intermittent confusion   . Cough 04/13/2015  . PAD (peripheral artery disease) ([place]) 02/22/2015  . Subclavian artery stenosis, left 12/23/2014  . NSTEMI (non-ST elevated myocardial infarction) ([place])   .  Hx of CABG 2008 08/31/2014  . CAD S/P S-PDA DES 2012 08/31/2014  . PVD (peripheral vascular disease) ([place]) 08/31/2014  . Murmur 08/31/2014  . Anxiety about health 08/31/2014  . Edema 07/27/2014  . Hypertensive urgency 07/20/2014  . Abnormal chest x-ray 03/26/2014  . Abnormal CT scan, esophagus 01/07/2014  . Generalized anxiety disorder   . Anemia, iron deficiency   . PAF (paroxysmal atrial fibrillation) ([place]) 03/08/2013  . Chronic diastolic heart failure ([place]) 10/30/2012  . Osteoporosis/severe osteopenia 06/12/2012  . Bruit of left carotid artery 09/27/2011  . GERD 11/19/2009  . Hyperlipidemia with target  LDL less than 100 10/04/2009  . ESOPHAGEAL STRICTURE 12/03/2007  . Coronary atherosclerosis 05/10/2007  . Seasonal and perennial allergic rhinitis 05/10/2007  . Asthma with COPD ([place]) 05/10/2007  . Obstructive sleep apnea 05/10/2007    Current Outpatient Prescriptions on File Prior to Visit  Medication Sig Dispense Refill  . acetaminophen (TYLENOL) 325 MG tablet Take 650 mg by mouth every 6 (six) hours as needed for moderate pain or headache.     . albuterol (PROAIR HFA) 108 (90 Base) MCG/ACT inhaler Inhale 2 puffs into the lungs every 6 (six) hours as needed for wheezing or shortness of breath. 1 Inhaler 12  . apixaban (ELIQUIS) 5 MG TABS tablet Take 1 tablet (5 mg total) by mouth 2 (two) times daily. 60 tablet 11  . bisoprolol (ZEBETA) 10 MG tablet Take 2 tablets (20 mg total) by mouth daily. 60 tablet 5  . calcium-vitamin D (OSCAL WITH D) 500-200 MG-UNIT per tablet Take 1 tablet by mouth 2 (two) times daily.    . cholestyramine (QUESTRAN) 4 g packet MIX AND DRINK 1 PACKET BY MOUTH TWICE A DAY IF NEEDED FOR LOOSE STOOLS (Patient taking differently: Take 8 g by mouth daily after lunch. 2 packets) 60 packet 2  . clopidogrel (PLAVIX) 75 MG tablet Take 1 tablet (75 mg total) by mouth daily with breakfast. 30 tablet 8  . dexlansoprazole (DEXILANT) 60 MG capsule Take 1 capsule (60 mg total) by mouth 2 (two) times daily before a meal. 60 capsule 3  . diphenoxylate-atropine (LOMOTIL) 2.5-0.025 MG tablet Take 1 tablet by mouth 4 (four) times daily as needed for diarrhea or loose stools. Only take if you are having diarrhea or loose stools. 30 tablet 0  . famotidine (PEPCID) 20 MG tablet Take 1 tablet (20 mg total) by mouth daily. 30 tablet 1  . ipratropium-albuterol (DUONEB) 0.5-2.5 (3) MG/3ML SOLN USE ONE VIAL VIA NEBULIZER EVERY 6 HOURS AS NEEDED FOR SHORTNESS OF BREATH (Patient taking differently: USE ONE VIAL VIA NEBULIZER THREE TIMES DAILY) 360 mL 5  . isosorbide mononitrate (IMDUR) 60 MG 24 hr  tablet Take 1 tablet (60 mg total) by mouth 2 (two) times daily. 180 tablet 0  . latanoprost (XALATAN) 0.005 % ophthalmic solution Place 1 drop into both eyes at bedtime.     Marland Kitchen LORazepam (ATIVAN) 0.5 MG tablet Take 0.5 mg by mouth 2 (two) times daily.    . mometasone-formoterol (DULERA) 100-5 MCG/ACT AERO Inhale 2 puffs into the lungs 2 (two) times daily. Rinse mouth after use    . montelukast (SINGULAIR) 10 MG tablet Take 1 tablet (10 mg total) by mouth at bedtime. 90 tablet 3  . nitroGLYCERIN (NITROSTAT) 0.4 MG SL tablet Place 1 tablet (0.4 mg total) under the tongue every 5 (five) minutes as needed for chest pain. 25 tablet 0  . pravastatin (PRAVACHOL) 80 MG tablet Take 1 tablet (80 mg total)  by mouth every morning. (Patient taking differently: Take 80 mg by mouth daily. ) 30 tablet 2  . PRESCRIPTION MEDICATION Inhale into the lungs at bedtime. CPAP/ 12    . PRESCRIPTION MEDICATION Inject 1 each into the skin once a week. Allergy shots from Dr. Annamaria Boots (1:10) - self-administered - usually Thursday or Friday    . Probiotic Product (PROBIOTIC DAILY) CAPS Take 1 capsule by mouth 2 (two) times daily.     Marland Kitchen RA VITAMIN B-12 TR 1000 MCG TBCR take 1 tablet by mouth once daily 30 tablet 11  . spironolactone (ALDACTONE) 25 MG tablet Take 1 tablet (25 mg total) by mouth daily. 30 tablet 2  . torsemide (DEMADEX) 20 MG tablet Take 1 tablet (20 mg total) by mouth daily. 30 tablet 2  . traZODone (DESYREL) 50 MG tablet Take 1 tablet (50 mg total) by mouth at bedtime as needed for sleep. 30 tablet 5  . valsartan (DIOVAN) 320 MG tablet Take 1 tablet (320 mg total) by mouth daily. 90 tablet 3   No current facility-administered medications on file prior to visit.     Past Medical History:  Diagnosis Date  . Arthritis   . ASTHMA   . Benign liver cyst   . CAD (coronary artery disease)    a. s/p CABG;  b. cath 06/27/10: S-Dx occluded, S-PDA 80-90% (tx with PCI); S-OM ok, L-LAD ok;  EF 65-70%  c.  s/p Promus DES to  S-PDA 06/2010;   d. Cath 8 2016 LIMA to the LAD patent, SVG to posterior lateral subtotal, SVG to diagonal occluded chronically. The patient had stenting of the native right vessel.  . Cataract    surgery to both eyes  . Chronic diastolic CHF (congestive heart failure), NYHA class 2 ([place])   . CKD (chronic kidney disease), stage II   . COPD (chronic obstructive pulmonary disease) ([place])   . Depression with anxiety   . Diverticulosis   . DYSLIPIDEMIA   . Esophageal stricture   . Essential hypertension    Echo 3/12: EF 55-60%; mod LVH; mild AS/AI; LAE; PASP 38; mild pulmo HTN  . GERD    with HH, hx esophageal stricture  . Glaucoma   . Hiatal hernia   . Osteoporosis   . PAF (paroxysmal atrial fibrillation) ([place])   . Personal history of alcoholism ([place])    quit etoh and abstinent since age 79.   Marland Kitchen Pneumonia 04/2013 hosp  . Skin cancer    L forearm  . SLEEP APNEA 09/2001   NPSG AHI 22/HR  . Subclavian arterial stenosis ([facility])    a. s/p PTCA of subclavian 2016.    Past Surgical History:  Procedure Laterality Date  . CARDIAC CATHETERIZATION N/A 11/26/2014   Procedure: Left Heart Cath and Cors/Grafts Angiography;  Surgeon: Sherren Mocha, MD;  Location: [facility];  Service: Cardiovascular;  Laterality: N/A;  . CARDIAC CATHETERIZATION N/A 11/26/2014   Procedure: Coronary Stent Intervention;  Surgeon: Sherren Mocha, MD;  Location: [facility];  Service: Cardiovascular;  Laterality: N/A;  . CATARACT EXTRACTION, BILATERAL  2007  . CHOLECYSTECTOMY  02/21/2011   Procedure: LAPAROSCOPIC CHOLECYSTECTOMY WITH INTRAOPERATIVE CHOLANGIOGRAM;  Surgeon: Pedro Earls, MD;  Location: [facility];  Service: General;  Laterality: N/A;  . CORONARY ANGIOPLASTY WITH STENT PLACEMENT  07/2010  . CORONARY ARTERY BYPASS GRAFT  02/2007  . FLEXIBLE SIGMOIDOSCOPY N/A 11/05/2015   Procedure: FLEXIBLE SIGMOIDOSCOPY;  Surgeon: Jerene Bears, MD;  Location: [facility];  Service:  Endoscopy;  Laterality: N/A;    . HERNIA REPAIR  unsure ?60's  . PERIPHERAL VASCULAR CATHETERIZATION N/A 02/22/2015   Procedure: Upper Extremity Angiography;  Surgeon: Lorretta Harp, MD;  Location: [facility];  Service: Cardiovascular;  Laterality: N/A;  . TONSILLECTOMY      Social History   Social History  . Marital status: Married    Spouse name: N/A  . Number of children: 2  . Years of education: N/A   Occupational History  . retired Retired    79 years   Social History Main Topics  . Smoking status: Former Smoker    Packs/day: 3.00    Years: 40.00    Types: Cigarettes    Quit date: 04/11/1979  . Smokeless tobacco: Never Used  . Alcohol use No     Comment: quit drinking 40+ years  . Drug use: No  . Sexual activity: Yes   Other Topics Concern  . 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 Married lives with wife 2 children   Served 3 yrs in [employer], enterred service age 61, deployed to [place] age 47.      Family History  Problem Relation Age of Onset  . COPD Sister   . Asthma Sister   . Emphysema Sister   . Hypertension Sister   . Hyperlipidemia Sister   . Hypertension Mother   . Heart disease Mother   . Hyperlipidemia Mother   . Heart disease Father   . Hyperlipidemia Father   . Hypertension Father   . Heart disease Brother   . Colon cancer Neg Hx     Review of Systems  Constitutional: Positive for fatigue. Negative for appetite change and fever.  Respiratory: Positive for cough and wheezing (occasional). Negative for shortness of breath.   Cardiovascular: Negative for chest pain, palpitations and leg swelling.  Gastrointestinal: Negative for abdominal pain, blood in stool, constipation, diarrhea and nausea.       GERD daily  Genitourinary: Positive for difficulty urinating. Negative for dysuria and hematuria.  Neurological: Positive for headaches. Negative for light-headedness.       Objective:   Vitals:   11/11/15 1031  BP: (!) 178/80   Pulse: 100  Temp: 98.2 F (36.8 C)   Filed Weights   11/11/15 1025 11/11/15 1031  Weight: 149 lb 6 oz (67.8 kg) 149 lb 6 oz (67.8 kg)   Body mass index is 27.32 kg/m.   Physical Exam  Constitutional: He appears well-developed and well-nourished. No distress.  HENT:  Head: Normocephalic and atraumatic.  Eyes: Conjunctivae are normal.  Neck: Neck supple. No tracheal deviation present. No thyromegaly present.  Cardiovascular: Normal rate and regular rhythm.   Murmur (2/6 systolic murmur) heard. Pulmonary/Chest: Breath sounds normal. No respiratory distress. He has no wheezes. He has no rales.  Abdominal: Soft. He exhibits no distension. There is no tenderness. There is no rebound and no guarding.  No obvious inguinal bulge, no discomfort with palpation  Musculoskeletal: He exhibits no edema.  Lymphadenopathy:    He has no cervical adenopathy.  Skin: Skin is warm and dry. He is not diaphoretic.  Psychiatric: He has a normal mood and affect. His behavior is normal.          Assessment & Plan:   See Problem List for Assessment and Plan of chronic medical problems.

## 2015-11-11 NOTE — Patient Instructions (Addendum)
  Test(s) ordered today. Your results will be released to [place] (or called to you) after review, usually within 72hours after test completion. If any changes need to be made, you will be notified at that same time.   Medications reviewed and updated.  Changes include starting dexilant once daily and zantac twice daily.  Your prescription(s) have been submitted to your pharmacy. Please take as directed and contact our office if you believe you are having problem(s) with the medication(s).   Please followup in 3 months

## 2015-11-11 NOTE — Assessment & Plan Note (Signed)
See hyponatremia likely related to diarrhea Improved while in the hospital with IV fluids Recheck CMP

## 2015-11-12 ENCOUNTER — Telehealth: Payer: Self-pay | Admitting: Emergency Medicine

## 2015-11-12 LAB — URINE CULTURE: ORGANISM ID, BACTERIA: NO GROWTH

## 2015-11-12 NOTE — Telephone Encounter (Signed)
Pts wife called and stated her husband was in yesterday to see Dr Quay Burow. He is very sick and she offered him a medication to take. He declined the medication but thinks he might need it now. She Is not 100 % what this medication was called. He has been throwing up yellow since about 6 am this morning and has a headache. Please follow up thanks.

## 2015-11-15 ENCOUNTER — Other Ambulatory Visit: Payer: Self-pay | Admitting: *Deleted

## 2015-11-15 ENCOUNTER — Encounter: Payer: Self-pay | Admitting: *Deleted

## 2015-11-15 MED ORDER — TAMSULOSIN HCL 0.4 MG PO CAPS: 0.4000 mg | ORAL_CAPSULE | Freq: Every day | ORAL | 0 refills | Status: DC

## 2015-11-15 NOTE — Telephone Encounter (Signed)
Per Dr. Quay Burow notes - Flomax sent to pharmacy.

## 2015-11-15 NOTE — Telephone Encounter (Signed)
Noted, pt aware.

## 2015-11-15 NOTE — Patient Outreach (Signed)
[facility]) Care Management  11/15/2015  Rachael Schickling Jalloh Jr. December 08, 1930 [id number]   RN spoke with pt and pt was in the background answering inquired questions due to his HOH. Spouse provided most of the information however pt able to verify. RN able to completed the initial assessment today and inquired if pt is monitoring his HF. Wife reports pt on telephonic monitoring and weighs daily with today's weight at a low at 139 lbs with no swelling or related HF issues. Pt remains in the GREEN zone with no acute symptoms reported as pt continues to wear his compression stockings to prevent swelling.  Wife reports [facility] remains involved with visits and HF monitoring. Plan of care reviewed with pt adherence to attending all medical appointments and taking all prescribed medications with no needed refills or issues related. RN offered to continue ongoing transition of care calls over the next 3 weeks as pt and spouse receptive. RN scheduled the next telephone call form next week and alerted pt and caregiver of the time and date for this contact. No request or needs at this time.  Will follow-up accordingly.   Raina Mina, RN Care Management Coordinator [place] Office [phone]

## 2015-11-15 NOTE — Telephone Encounter (Signed)
Spoke with pts wife, states that it is Flomax that pt declined and would now like to start. Please advise on the dosage that should be sent in.

## 2015-11-15 NOTE — Telephone Encounter (Signed)
Forwarded to Dr Quay Burow' assistant

## 2015-11-17 ENCOUNTER — Telehealth: Payer: Self-pay | Admitting: Emergency Medicine

## 2015-11-17 ENCOUNTER — Telehealth: Payer: Self-pay | Admitting: Internal Medicine

## 2015-11-17 ENCOUNTER — Other Ambulatory Visit (INDEPENDENT_AMBULATORY_CARE_PROVIDER_SITE_OTHER): Payer: Medicare Other

## 2015-11-17 DIAGNOSIS — E871 Hypo-osmolality and hyponatremia: Secondary | ICD-10-CM | POA: Diagnosis not present

## 2015-11-17 LAB — BASIC METABOLIC PANEL
BUN: 22 mg/dL (ref 6–23)
CALCIUM: 8.8 mg/dL (ref 8.4–10.5)
CO2: 31 mEq/L (ref 19–32)
Chloride: 87 mEq/L — ABNORMAL LOW (ref 96–112)
Creatinine, Ser: 1.17 mg/dL (ref 0.40–1.50)
GFR: 63.32 mL/min (ref >60.00)
Glucose, Bld: 89 mg/dL (ref 70–99)
Potassium: 3.9 mEq/L (ref 3.5–5.1)
SODIUM: 122 meq/L — AB (ref 135–145)

## 2015-11-17 NOTE — Telephone Encounter (Signed)
BMP ordered per MDs request

## 2015-11-17 NOTE — Telephone Encounter (Signed)
[facility] [place] [facility]  Patient Name: [name]  DOB: 10/14/1930    Initial Comment Caller states her husband has groin pain, fairly constant, slows but returns, may be due to hernia   Nurse Assessment  Nurse: Wayne Sever, RN, Tillie Rung Date/Time (Eastern Time): 11/17/2015 12:35:38 PM  Confirm and document reason for call. If symptomatic, describe symptoms. You must click the next button to save text entered. ---Caller's wife states he is having groin pain. He was seen in office last week for a hernia. She states he was suppose to get another referral to another doctor. Wife states she is on the way to office now for lab work. She also wants to know what time the office opens back up for lunch. Wife states he is still having pain in his groin area, and she states it comes and goes. Wife states he has gotten worse since follow up last week. Wife states he has been in the hospital multiple times lately.  Has the patient traveled out of the country within the last 30 days? ---Not Applicable  Does the patient have any new or worsening symptoms? ---Yes  Will a triage be completed? ---Yes  Related visit to physician within the last 2 weeks? ---Yes  Does the PT have any chronic conditions? (i.e. diabetes, asthma, etc.) ---Yes  List chronic conditions. ---CHF, HTN, Asthma  Is this a behavioral health or substance abuse call? ---No     Guidelines    Guideline Title Affirmed Question Affirmed Notes  Hernia Previously diagnosed hernia (all triage questions negative)    Final Disposition User   See PCP within 2 Deidre Ala, RN, Tillie Rung    Comments  Scheduled with Pricilla Holm 08/10 at 345p   Referrals  REFERRED TO PCP OFFICE   Disagree/Comply: Comply

## 2015-11-17 NOTE — Telephone Encounter (Signed)
LVM with wife, advising to go to urgent care per Gregs response to [facility] note.

## 2015-11-17 NOTE — Telephone Encounter (Signed)
Patient's wife called back in stating that spouse is sicker and believes he has a hernia.  Transferred to [facility].

## 2015-11-17 NOTE — Telephone Encounter (Signed)
Recommend office visit or Urgent Care if symptoms persist.

## 2015-11-18 ENCOUNTER — Ambulatory Visit (INDEPENDENT_AMBULATORY_CARE_PROVIDER_SITE_OTHER): Payer: Medicare Other | Admitting: Internal Medicine

## 2015-11-18 ENCOUNTER — Encounter: Payer: Self-pay | Admitting: Internal Medicine

## 2015-11-18 ENCOUNTER — Ambulatory Visit: Payer: Self-pay | Admitting: Internal Medicine

## 2015-11-18 VITALS — BP 140/80 | HR 68 | Temp 98.5°F | Resp 18 | Ht 62.0 in | Wt 148.0 lb

## 2015-11-18 DIAGNOSIS — K529 Noninfective gastroenteritis and colitis, unspecified: Secondary | ICD-10-CM

## 2015-11-18 DIAGNOSIS — I5032 Chronic diastolic (congestive) heart failure: Secondary | ICD-10-CM | POA: Diagnosis not present

## 2015-11-18 DIAGNOSIS — E871 Hypo-osmolality and hyponatremia: Secondary | ICD-10-CM

## 2015-11-18 DIAGNOSIS — N390 Urinary tract infection, site not specified: Secondary | ICD-10-CM

## 2015-11-18 DIAGNOSIS — J449 Chronic obstructive pulmonary disease, unspecified: Secondary | ICD-10-CM

## 2015-11-18 DIAGNOSIS — K4090 Unilateral inguinal hernia, without obstruction or gangrene, not specified as recurrent: Secondary | ICD-10-CM

## 2015-11-18 DIAGNOSIS — K4020 Bilateral inguinal hernia, without obstruction or gangrene, not specified as recurrent: Secondary | ICD-10-CM

## 2015-11-18 NOTE — Progress Notes (Signed)
Pre visit review using our clinic review tool, if applicable. No additional management support is needed unless otherwise documented below in the visit note. 

## 2015-11-18 NOTE — Patient Instructions (Addendum)
We would like you to lower the spironolactone to 1/2 pill daily.   We would like you to lower the torsemide for 3 days. Take 1/2 pill daily. Once you have blood work on Monday you will get instructions on the medications.   Please stop taking the flomax since it has not helped. We will get you in with a surgeon for the pain in the groin for the hernia which is likely causing the groin pain. The coughing can cause this to worsen and lifting can worsen this pain. It is okay to take tylenol for the pain.   Hyponatremia Hyponatremia is when the amount of salt (sodium) in your blood is too low. When sodium levels are low, your cells absorb extra water and they swell. The swelling happens throughout the body, but it mostly affects the brain. CAUSES This condition may be caused by:  Heart, kidney, or liver problems.  Thyroid problems.  Adrenal gland problems.  Metabolic conditions, such as syndrome of inappropriate antidiuretic hormone (SIADH).  Severe vomiting and diarrhea.  Certain medicines or illegal drugs.  Dehydration.  Drinking too much water.  Eating a diet that is low in sodium.  Large burns on your body.  Sweating. RISK FACTORS This condition is more likely to develop in people who:  Have long-term (chronic) kidney disease.  Have heart failure.  Have a medical condition that causes frequent or excessive diarrhea.  Have metabolic conditions, such as Addison disease or SIADH.  Take certain medicines that affect the sodium and fluid balance in the blood. Some of these medicine types include:  Diuretics.  NSAIDs.  Some opioid pain medicines.  Some antidepressants.  Some seizure prevention medicines. SYMPTOMS  Symptoms of this condition include:  Nausea and vomiting.  Confusion.  Lethargy.  Agitation.  Headache.  Seizures.  Unconsciousness.  Appetite loss.  Muscle weakness and cramping.  Feeling weak or light-headed.  Having a rapid heart  rate.  Fainting, in severe cases. DIAGNOSIS This condition is diagnosed with a medical history and physical exam. You will also have other tests, including:  Blood tests.  Urine tests. TREATMENT Treatment for this condition depends on the cause. Treatment may include:  Fluids given through an IV tube that is inserted into one of your veins.  Medicines to correct the sodium imbalance. If medicines are causing the condition, the medicines will need to be adjusted.  Limiting water or fluid intake to get the correct sodium balance. HOME CARE INSTRUCTIONS  Take medicines only as directed by your health care provider. Many medicines can make this condition worse. Talk with your health care provider about any medicines that you are currently taking.  Carefully follow a recommended diet as directed by your health care provider.  Carefully follow instructions from your health care provider about fluid restrictions.  Keep all follow-up visits as directed by your health care provider. This is important.  Do not drink alcohol. SEEK MEDICAL CARE IF:  You develop worsening nausea, fatigue, headache, confusion, or weakness.  Your symptoms go away and then return.  You have problems following the recommended diet. SEEK IMMEDIATE MEDICAL CARE IF:  You have a seizure.  You faint.  You have ongoing diarrhea or vomiting.   This information is not intended to replace advice given to you by your health care provider. Make sure you discuss any questions you have with your health care provider.   Document Released: 03/17/2002 Document Revised: 08/11/2014 Document Reviewed: 04/16/2014 Elsevier Interactive Patient Education [employer].

## 2015-11-19 NOTE — Progress Notes (Signed)
   Subjective:    Patient ID: Stephen Door., male    DOB: [birth year], 82 y.o.   MRN: [id number]  HPI The patient is an 82 YO man coming in for multiple concerns with his wife. She helps to provide history. He has had many hospitalizations recently and they are upset that things have not been figured out and they feel rushed in and out with being sent home too early due to his multiple readmissions recently. He was recently in the hospital with diarrhea and low sodium levels (upon review of the hospitalization records got sigmoidoscopy to find cause of diarrhea and no cause found, possibly the low sodium level is related to some diuretics and diarrhea although GI was not convinced given that his diarrhea was on and off for many years and the low sodium level was acute). He is not feeling well today and wants to start feeling better. He was seen about 1 week ago by his PCP after discharge and complained to her about groin pain and some hesitancy with urination. He has known bilateral inguinal hernias. This is the area he is having the pain in which sometimes radiates into his scrotum. Does not radiate into his back or legs. Pain with certain positions is worse but mostly with standing and bending and straightening. He is also having some new cough which is making it hurt more. They are very concerned about the sodium levels as they just got the levels checked yesterday and have not gotten a call about the results yet (review of epic reveals that PCP responded with instructions which have not reached the patient yet but his hyponatremia is worsening).   PMH, [facility], social history reviewed and updated.   Review of Systems  Constitutional: Positive for activity change, appetite change and fatigue. Negative for chills, fever and unexpected weight change.  Respiratory: Positive for cough and shortness of breath. Negative for chest tightness and wheezing.        SOB with exertion  Cardiovascular: Negative for  chest pain, palpitations and leg swelling.  Gastrointestinal: Positive for diarrhea. Negative for abdominal distention, abdominal pain and constipation.       1 episode only  Musculoskeletal: Positive for arthralgias. Negative for back pain and gait problem.  Skin: Negative.   Neurological: Positive for weakness. Negative for dizziness, light-headedness and headaches.  Psychiatric/Behavioral: Positive for dysphoric mood and sleep disturbance. Negative for self-injury and suicidal ideas. The patient is nervous/anxious.        Objective:   Physical Exam  Constitutional: He is oriented to person, place, and time. He appears well-developed and well-nourished.  HENT:  Head: Normocephalic and atraumatic.  Eyes: EOM are normal.  Neck: Normal range of motion.  Cardiovascular: Normal rate.   Pulmonary/Chest: Effort normal. No respiratory distress. He has no wheezes. He has no rales.  Abdominal: Soft. Bowel sounds are normal. He exhibits no distension. There is no tenderness. There is no rebound.  Musculoskeletal: He exhibits tenderness. He exhibits no edema.  Pain in the left groin  Neurological: He is alert and oriented to person, place, and time. Coordination abnormal.  Slow gait and hesitating  Skin: Skin is warm and dry.  Psychiatric:  Anxious and his wife is irritable throughout the visit      Assessment & Plan:

## 2015-11-19 NOTE — Assessment & Plan Note (Addendum)
This is worsening and needs recheck on Monday. He is asked to half spironolactone and also 1/2 torsemide until Monday to see if this helps. Review of the labs and hospital records indicate that thyroid checked in the hospital and not worrisome. He does have heart failure and needs to be on diuretics chronically. No edema or overload on exam. No mental status change or muscle problems to suggest need for hospitalization now. If worsening on Monday needs more aggressive intervention.

## 2015-11-19 NOTE — Assessment & Plan Note (Signed)
No overload today, decreasing his diuretics for the low sodium level.

## 2015-11-19 NOTE — Assessment & Plan Note (Signed)
Suspect this is the cause of his pain and referral done to general surgery although it is not clear that he is a good surgical candidate at this time with his ongoing hyponatremia.

## 2015-11-19 NOTE — Assessment & Plan Note (Signed)
Breathing is stable but increased cough. CXR okay from recent hospital visits. Lungs clear on exam.

## 2015-11-19 NOTE — Assessment & Plan Note (Signed)
This has actually resolved since last visit (one episode only) and stable. Will keep follow up with GI.

## 2015-11-19 NOTE — Assessment & Plan Note (Signed)
This is now resolved and culture from 1 week ago reveals no bacteria. No more antibiotics are needed and his urinary symptom are not related. He trial flomax which was not effective for his symptoms and could be partially obstructive from the hernia. He will stop flomax.

## 2015-11-22 ENCOUNTER — Other Ambulatory Visit: Payer: Self-pay | Admitting: *Deleted

## 2015-11-22 ENCOUNTER — Other Ambulatory Visit (INDEPENDENT_AMBULATORY_CARE_PROVIDER_SITE_OTHER): Payer: Medicare Other

## 2015-11-22 DIAGNOSIS — E871 Hypo-osmolality and hyponatremia: Secondary | ICD-10-CM | POA: Diagnosis not present

## 2015-11-22 DIAGNOSIS — I10 Essential (primary) hypertension: Secondary | ICD-10-CM

## 2015-11-22 LAB — BASIC METABOLIC PANEL
BUN: 15 mg/dL (ref 6–23)
CHLORIDE: 96 meq/L (ref 96–112)
CO2: 29 mEq/L (ref 19–32)
CREATININE: 0.93 mg/dL (ref 0.40–1.50)
Calcium: 8.9 mg/dL (ref 8.4–10.5)
GFR: 82.53 mL/min (ref >60.00)
Glucose, Bld: 93 mg/dL (ref 70–99)
Potassium: 3.8 mEq/L (ref 3.5–5.1)
Sodium: 129 mEq/L — ABNORMAL LOW (ref 135–145)

## 2015-11-22 NOTE — Patient Outreach (Signed)
[name]) Care Management  11/22/2015  Stephen Pasley Fenner Jr. 02-Jun-1930 [id number]   RN spoke with pt and wife today concerning pt's ongoing management of care. Caregiver wife indicates pt had an appointment today for lab-work and was informed his sodium level were low. Pt continues to have issues with a new dx of a hernia and informed to take Tylenol unless his pending appointment with the surgeon occurs. Wife states pt has improved with hi reflux and tolerating his diet with no problems. Denies any issues with GU/GI and continues to work on his goals with the reviewed plan of care. Pt continues to deny any issues with his COPD and uses his CPAP every night with no problems and remains in the GREEN zone. RN inquired on pt's weight with the most recent at 142 lbs with no swelling or issues breathing. Pt is in the GREEN zone however states his scales are indicating he does not have to weigh. RN inquired and requested caregiver wife to contact the providing company and inquired on his HF tele-monitoring scales to regulate to daily weight request. Wife will call however if any encountered problems will contact this RN case manager to intervene. Will continue to encouraged adherence to the plan of care and follow up next week with ongoing transition of care contact.   Raina Mina, RN Care Management Coordinator [place] Office [phone]

## 2015-11-23 LAB — OSMOLALITY: Osmolality: 267 mOsm/kg — ABNORMAL LOW (ref 278–305)

## 2015-11-25 ENCOUNTER — Other Ambulatory Visit: Payer: Self-pay | Admitting: *Deleted

## 2015-11-25 ENCOUNTER — Other Ambulatory Visit: Payer: Self-pay | Admitting: Cardiology

## 2015-11-25 MED ORDER — PRAVASTATIN SODIUM 80 MG PO TABS: 80.0000 mg | ORAL_TABLET | Freq: Every morning | ORAL | 3 refills | Status: AC

## 2015-11-29 ENCOUNTER — Other Ambulatory Visit: Payer: Self-pay | Admitting: *Deleted

## 2015-11-29 NOTE — Patient Outreach (Signed)
[facility]) Care Management  11/29/2015  Stephen Clyatt Seeman Jr. Oct 15, 1930 [id number]   Transition of care  RN spoke with pt's spouse today who indicates pt continues to have a dry cough due to his hernia (pending call back from provider on possible intervention). Reports pt's weight remains the same at 142 lbs with no swelling. Pt continues to take all prescribed medications and attend all medical attentions with no delays. RN offered any community resources and extended community home visits if pt feels this is beneficial to his ongoing management of care however this was declined. Plan of care reviewed with ongoing encouragement of pt's participation. Offered to follow up one additional one additional time next week for the last contact via transition of care contact (spouse agreed). Will schedule and follow up next week as agreed upon today.  Raina Mina, RN Care Management Coordinator [place] Office [phone]

## 2015-11-30 ENCOUNTER — Other Ambulatory Visit: Payer: Self-pay | Admitting: Internal Medicine

## 2015-12-03 ENCOUNTER — Telehealth: Payer: Self-pay

## 2015-12-03 NOTE — Telephone Encounter (Signed)
Please advise 

## 2015-12-03 NOTE — Telephone Encounter (Signed)
Is it the hernia that hurts?  We can try tramadol - that is the only thing we can fax.

## 2015-12-03 NOTE — Telephone Encounter (Signed)
Patient is requesting a rx for some pain medication. His wife states that the tylenol is not working. She states they are still waiting on a call from surgeon but it seems noone wants to do it because of his age. Please advise or follow up.

## 2015-12-06 NOTE — Telephone Encounter (Signed)
Spoke with pts wife, States he has an appt with Dr Quay Burow on 8/29. Will wait until then to discuss pain med.Marland Kitchen

## 2015-12-07 ENCOUNTER — Encounter: Payer: Self-pay | Admitting: Internal Medicine

## 2015-12-07 ENCOUNTER — Other Ambulatory Visit (INDEPENDENT_AMBULATORY_CARE_PROVIDER_SITE_OTHER): Payer: Medicare Other

## 2015-12-07 ENCOUNTER — Ambulatory Visit (INDEPENDENT_AMBULATORY_CARE_PROVIDER_SITE_OTHER): Payer: Medicare Other | Admitting: Internal Medicine

## 2015-12-07 VITALS — BP 164/88 | HR 70 | Temp 98.3°F | Resp 16 | Ht 62.0 in | Wt 148.0 lb

## 2015-12-07 DIAGNOSIS — I5032 Chronic diastolic (congestive) heart failure: Secondary | ICD-10-CM | POA: Diagnosis not present

## 2015-12-07 DIAGNOSIS — K4020 Bilateral inguinal hernia, without obstruction or gangrene, not specified as recurrent: Secondary | ICD-10-CM

## 2015-12-07 DIAGNOSIS — E871 Hypo-osmolality and hyponatremia: Secondary | ICD-10-CM | POA: Diagnosis not present

## 2015-12-07 LAB — BASIC METABOLIC PANEL
BUN: 16 mg/dL (ref 6–23)
CALCIUM: 8.9 mg/dL (ref 8.4–10.5)
CO2: 31 meq/L (ref 19–32)
CREATININE: 0.92 mg/dL (ref 0.40–1.50)
Chloride: 91 mEq/L — ABNORMAL LOW (ref 96–112)
GFR: 83.55 mL/min (ref >60.00)
Glucose, Bld: 98 mg/dL (ref 70–99)
Potassium: 3.9 mEq/L (ref 3.5–5.1)
Sodium: 127 mEq/L — ABNORMAL LOW (ref 135–145)

## 2015-12-07 MED ORDER — SPIRONOLACTONE 25 MG PO TABS: 12.5000 mg | ORAL_TABLET | Freq: Every day | ORAL | 2 refills | Status: DC

## 2015-12-07 MED ORDER — TRAMADOL HCL 50 MG PO TABS: 50.0000 mg | ORAL_TABLET | Freq: Three times a day (TID) | ORAL | 0 refills | Status: DC | PRN

## 2015-12-07 MED ORDER — TORSEMIDE 20 MG PO TABS: 10.0000 mg | ORAL_TABLET | Freq: Every day | ORAL | 2 refills | Status: DC

## 2015-12-07 NOTE — Patient Instructions (Addendum)
  Test(s) ordered today. Your results will be released to [place] (or called to you) after review, usually within 72hours after test completion. If any changes need to be made, you will be notified at that same time.  Continue all your current medications for now.   Try tramadol for your inguinal hernia pain until you see the surgeon.

## 2015-12-07 NOTE — Progress Notes (Signed)
Subjective:    Patient ID: [name]., male    DOB: 1931-02-01, 82 y.o.   MRN: [id number]  HPI He is here for follow up.  Hyponatremia:  His diuretics have been decreased.  He is taking all his medication daily as prescribed.  He denies changes in his leg edema or sob.  He overall feels good. He drinks one gatorade a day and three waters.  He denies headaches and lightheadedness.    He is having significant pain from his inguinal hernia.  He does have an appointment with a surgeon, but not until next month.  He is taking tylenol and it helps sometimes, but only a little.  He wonders if there is something else he can take.    Medications and allergies reviewed with patient and updated if appropriate.  Patient Active Problem List   Diagnosis Date Noted  . Inguinal hernia, b/l 11/11/2015  . Chronic diarrhea   . Depression with anxiety   . UTI (lower urinary tract infection) 11/03/2015  . CKD (chronic kidney disease), stage II 10/21/2015  . Symptomatic bradycardia 10/17/2015  . Chronic diastolic CHF (congestive heart failure), NYHA class 2 ([place])   . COPD (chronic obstructive pulmonary disease) ([place]) 09/23/2015  . COPD exacerbation ([place]) 09/23/2015  . Sleeping difficulties 09/08/2015  . Cerebral embolism with cerebral infarction 05/18/2015  . Coronary artery disease involving native coronary artery of native heart   . Numbness and tingling in right hand 05/17/2015  . Hyponatremia 05/17/2015  . Essential hypertension   . Intermittent confusion   . Cough 04/13/2015  . PAD (peripheral artery disease) ([place]) 02/22/2015  . Subclavian artery stenosis, left 12/23/2014  . NSTEMI (non-ST elevated myocardial infarction) ([place])   . Hx of CABG 2008 08/31/2014  . CAD S/P S-PDA DES 2012 08/31/2014  . PVD (peripheral vascular disease) ([place]) 08/31/2014  . Murmur 08/31/2014  . Anxiety about health 08/31/2014  . Abnormal chest x-ray 03/26/2014  . Abnormal CT scan, esophagus 01/07/2014  .  Generalized anxiety disorder   . Anemia, iron deficiency   . PAF (paroxysmal atrial fibrillation) ([place]) 03/08/2013  . Chronic diastolic heart failure ([place]) 10/30/2012  . Osteoporosis/severe osteopenia 06/12/2012  . Bruit of left carotid artery 09/27/2011  . GERD 11/19/2009  . Hyperlipidemia with target LDL less than 100 10/04/2009  . ESOPHAGEAL STRICTURE 12/03/2007  . Coronary atherosclerosis 05/10/2007  . Seasonal and perennial allergic rhinitis 05/10/2007  . Asthma with COPD ([place]) 05/10/2007  . Obstructive sleep apnea 05/10/2007    Current Outpatient Prescriptions on File Prior to Visit  Medication Sig Dispense Refill  . acetaminophen (TYLENOL) 325 MG tablet Take 650 mg by mouth every 6 (six) hours as needed for moderate pain or headache.     . albuterol (PROAIR HFA) 108 (90 Base) MCG/ACT inhaler Inhale 2 puffs into the lungs every 6 (six) hours as needed for wheezing or shortness of breath. 1 Inhaler 12  . apixaban (ELIQUIS) 5 MG TABS tablet Take 1 tablet (5 mg total) by mouth 2 (two) times daily. 60 tablet 11  . bisoprolol (ZEBETA) 10 MG tablet Take 2 tablets (20 mg total) by mouth daily. 60 tablet 5  . calcium-vitamin D (OSCAL WITH D) 500-200 MG-UNIT per tablet Take 1 tablet by mouth 2 (two) times daily.    . cholestyramine (QUESTRAN) 4 g packet MIX AND DRINK 1 PACKET BY MOUTH TWICE A DAY IF NEEDED FOR LOOSE STOOLS (Patient taking differently: Take 8 g by mouth daily after lunch.  2 packets) 60 packet 2  . clopidogrel (PLAVIX) 75 MG tablet Take 1 tablet (75 mg total) by mouth daily with breakfast. 30 tablet 8  . dexlansoprazole (DEXILANT) 60 MG capsule Take 1 capsule (60 mg total) by mouth daily. 30 capsule 3  . diphenoxylate-atropine (LOMOTIL) 2.5-0.025 MG tablet Take 1 tablet by mouth 4 (four) times daily as needed for diarrhea or loose stools. Only take if you are having diarrhea or loose stools. 30 tablet 0  . famotidine (PEPCID) 20 MG tablet Take 1 tablet (20 mg total) by mouth  daily. 30 tablet 1  . ipratropium-albuterol (DUONEB) 0.5-2.5 (3) MG/3ML SOLN USE ONE VIAL VIA NEBULIZER EVERY 6 HOURS AS NEEDED FOR SHORTNESS OF BREATH (Patient taking differently: USE ONE VIAL VIA NEBULIZER THREE TIMES DAILY) 360 mL 5  . isosorbide mononitrate (IMDUR) 60 MG 24 hr tablet Take 1 tablet (60 mg total) by mouth 2 (two) times daily. 180 tablet 0  . latanoprost (XALATAN) 0.005 % ophthalmic solution Place 1 drop into both eyes at bedtime.     Marland Kitchen LORazepam (ATIVAN) 0.5 MG tablet Take 0.5 mg by mouth 2 (two) times daily.    . mometasone-formoterol (DULERA) 100-5 MCG/ACT AERO Inhale 2 puffs into the lungs 2 (two) times daily. Rinse mouth after use    . montelukast (SINGULAIR) 10 MG tablet take 1 tablet by mouth at bedtime 90 tablet 3  . nitroGLYCERIN (NITROSTAT) 0.4 MG SL tablet Place 1 tablet (0.4 mg total) under the tongue every 5 (five) minutes as needed for chest pain. 25 tablet 0  . pravastatin (PRAVACHOL) 80 MG tablet Take 1 tablet (80 mg total) by mouth every morning. 90 tablet 3  . PRESCRIPTION MEDICATION Inhale into the lungs at bedtime. CPAP/ 12    . PRESCRIPTION MEDICATION Inject 1 each into the skin once a week. Allergy shots from Dr. Annamaria Boots (1:10) - self-administered - usually Thursday or Friday    . Probiotic Product (PROBIOTIC DAILY) CAPS Take 1 capsule by mouth 2 (two) times daily.     Marland Kitchen RA VITAMIN B-12 TR 1000 MCG TBCR take 1 tablet by mouth once daily 30 tablet 11  . ranitidine (ZANTAC) 150 MG tablet Take 1 tablet (150 mg total) by mouth 2 (two) times daily. 60 tablet 3  . spironolactone (ALDACTONE) 25 MG tablet Take 1 tablet (25 mg total) by mouth daily. 30 tablet 2  . torsemide (DEMADEX) 20 MG tablet Take 1 tablet (20 mg total) by mouth daily. 30 tablet 2  . traZODone (DESYREL) 50 MG tablet Take 1 tablet (50 mg total) by mouth at bedtime as needed for sleep. 30 tablet 5  . valsartan (DIOVAN) 320 MG tablet Take 1 tablet (320 mg total) by mouth daily. 90 tablet 3   No  current facility-administered medications on file prior to visit.     Past Medical History:  Diagnosis Date  . Arthritis   . ASTHMA   . Benign liver cyst   . CAD (coronary artery disease)    a. s/p CABG;  b. cath 06/27/10: S-Dx occluded, S-PDA 80-90% (tx with PCI); S-OM ok, L-LAD ok;  EF 65-70%  c.  s/p Promus DES to S-PDA 06/2010;   d. Cath 8 2016 LIMA to the LAD patent, SVG to posterior lateral subtotal, SVG to diagonal occluded chronically. The patient had stenting of the native right vessel.  . Cataract    surgery to both eyes  . Chronic diastolic CHF (congestive heart failure), NYHA class 2 ([place])   .  CKD (chronic kidney disease), stage II   . COPD (chronic obstructive pulmonary disease) ([place])   . Depression with anxiety   . Diverticulosis   . DYSLIPIDEMIA   . Esophageal stricture   . Essential hypertension    Echo 3/12: EF 55-60%; mod LVH; mild AS/AI; LAE; PASP 38; mild pulmo HTN  . GERD    with HH, hx esophageal stricture  . Glaucoma   . Hiatal hernia   . Osteoporosis   . PAF (paroxysmal atrial fibrillation) ([place])   . Personal history of alcoholism ([place])    quit etoh and abstinent since age 64.   Marland Kitchen Pneumonia 04/2013 hosp  . Skin cancer    L forearm  . SLEEP APNEA 09/2001   NPSG AHI 22/HR  . Subclavian arterial stenosis ([facility])    a. s/p PTCA of subclavian 2016.    Past Surgical History:  Procedure Laterality Date  . CARDIAC CATHETERIZATION N/A 11/26/2014   Procedure: Left Heart Cath and Cors/Grafts Angiography;  Surgeon: Sherren Mocha, MD;  Location: [place] [facility];  Service: Cardiovascular;  Laterality: N/A;  . CARDIAC CATHETERIZATION N/A 11/26/2014   Procedure: Coronary Stent Intervention;  Surgeon: Sherren Mocha, MD;  Location: [place] [facility];  Service: Cardiovascular;  Laterality: N/A;  . CATARACT EXTRACTION, BILATERAL  2007  . CHOLECYSTECTOMY  02/21/2011   Procedure: LAPAROSCOPIC CHOLECYSTECTOMY WITH INTRAOPERATIVE CHOLANGIOGRAM;  Surgeon: Pedro Earls,  MD;  Location: [facility];  Service: General;  Laterality: N/A;  . CORONARY ANGIOPLASTY WITH STENT PLACEMENT  07/2010  . CORONARY ARTERY BYPASS GRAFT  02/2007  . FLEXIBLE SIGMOIDOSCOPY N/A 11/05/2015   Procedure: FLEXIBLE SIGMOIDOSCOPY;  Surgeon: Jerene Bears, MD;  Location: [facility] ENDOSCOPY;  Service: Endoscopy;  Laterality: N/A;  . HERNIA REPAIR  unsure ?60's  . PERIPHERAL VASCULAR CATHETERIZATION N/A 02/22/2015   Procedure: Upper Extremity Angiography;  Surgeon: Lorretta Harp, MD;  Location: [place] [facility];  Service: Cardiovascular;  Laterality: N/A;  . TONSILLECTOMY      Social History   Social History  . Marital status: Married    Spouse name: N/A  . Number of children: 2  . Years of education: N/A   Occupational History  . retired Retired    58 years   Social History Main Topics  . Smoking status: Former Smoker    Packs/day: 3.00    Years: 40.00    Types: Cigarettes    Quit date: 04/11/1979  . Smokeless tobacco: Never Used  . Alcohol use No     Comment: quit drinking 40+ years  . Drug use: No  . Sexual activity: Yes   Other Topics Concern  . 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 Married lives with wife 2 children   Served 3 yrs in [employer], enterred service age 75, deployed to [place] age 69.      Family History  Problem Relation Age of Onset  . COPD Sister   . Asthma Sister   . Emphysema Sister   . Hypertension Sister   . Hyperlipidemia Sister   . Hypertension Mother   . Heart disease Mother   . Hyperlipidemia Mother   . Heart disease Father   . Hyperlipidemia Father   . Hypertension Father   . Heart disease Brother   . Colon cancer Neg Hx     Review of Systems  Respiratory: Negative for shortness of breath.   Cardiovascular: Negative for chest pain and leg swelling.  Neurological: Negative for light-headedness and headaches.       Objective:   Vitals:   12/07/15 1403  BP: (!) 164/88  Pulse: 70  Resp: 16   Temp: 98.3 F (36.8 C)   Filed Weights   12/07/15 1403  Weight: 148 lb (67.1 kg)   Body mass index is 27.07 kg/m.   Physical Exam Constitutional: Appears well-developed and well-nourished. No distress.  HENT:  Head: Normocephalic and atraumatic.  Neck: Neck supple. No tracheal deviation present. No thyromegaly present.  Cardiovascular: Normal rate, regular rhythm and normal heart sounds.   No murmur heard. Pulmonary/Chest: Effort normal and breath sounds normal. No respiratory distress. No has no wheezes. No rales.  Musculoskeletal: No edema. wearing commpression socks Lymphadenopathy: No cervical adenopathy.  Skin: Skin is warm and dry. Not diaphoretic.  Psychiatric: Normal mood and affect. Behavior is normal.         Assessment & Plan:   See Problem List for Assessment and Plan of chronic medical problems.

## 2015-12-07 NOTE — Assessment & Plan Note (Signed)
?   Cause, ? Medication vs CHF Check bmp today Consider referral to nephrology

## 2015-12-07 NOTE — Assessment & Plan Note (Signed)
No evidence of fluid overload on exam  Tolerating lower dose of diuretics, but that is not likely the cause of the hyponatremia Continue current medications

## 2015-12-07 NOTE — Progress Notes (Signed)
Pre visit review using our clinic review tool, if applicable. No additional management support is needed unless otherwise documented below in the visit note. 

## 2015-12-07 NOTE — Assessment & Plan Note (Signed)
Having increased pain Continue tylenol for mild-mod pain Will start tramadol for severe pain Has appt with surgery

## 2015-12-08 ENCOUNTER — Encounter: Payer: Self-pay | Admitting: *Deleted

## 2015-12-08 ENCOUNTER — Other Ambulatory Visit: Payer: Self-pay | Admitting: *Deleted

## 2015-12-08 ENCOUNTER — Other Ambulatory Visit: Payer: Self-pay | Admitting: Internal Medicine

## 2015-12-08 DIAGNOSIS — E871 Hypo-osmolality and hyponatremia: Secondary | ICD-10-CM

## 2015-12-08 NOTE — Patient Outreach (Signed)
[facility]) Care Management  12/08/2015  [name]. [birth year] [id number]   RN spoke with primary caregiver today and inquired on pt's ongoing management of his care. Wife reports pt is doing well with his HF with daily weights remaining the same 142 lbs with no swelling or trouble breathing. RN able to verify pt remains in the GREEN zone with reported incidents. Wife also updated RN on pt's hernia indicating pt is having some pain however has pain medication when needed but currently taking Extra Strength Tylenol. Pt pending an appointment concerning this hernia on 9/11 and pt recently visited his primary care provider with no additional issues. No additional resources or needs at this time as RN verified pt again does not wish to have home visits and feels he is managing his care as reported by his spouse very well. RN has informed caregiver if [facility] services are needed in the future to please contact this RN directly or the main [facility] office via [facility] (verbalized an understanding). Based upon this information provided with all goals met via plan of care and today is the last contact date via transition of care calls this case will be closed and the primary provided will be notified.   Raina Mina, RN Care Management Coordinator [place] Office [phone]

## 2015-12-13 ENCOUNTER — Encounter (HOSPITAL_COMMUNITY): Payer: Self-pay | Admitting: *Deleted

## 2015-12-13 ENCOUNTER — Observation Stay (HOSPITAL_COMMUNITY)
Admission: EM | Admit: 2015-12-13 | Discharge: 2015-12-16 | Disposition: A | Discharge status: Home / Self Care | Payer: Medicare Other | Attending: Family Medicine | Admitting: Family Medicine

## 2015-12-13 ENCOUNTER — Emergency Department (HOSPITAL_COMMUNITY): Payer: Medicare Other

## 2015-12-13 DIAGNOSIS — I130 Hypertensive heart and chronic kidney disease with heart failure and stage 1 through stage 4 chronic kidney disease, or unspecified chronic kidney disease: Secondary | ICD-10-CM | POA: Insufficient documentation

## 2015-12-13 DIAGNOSIS — Z85828 Personal history of other malignant neoplasm of skin: Secondary | ICD-10-CM | POA: Insufficient documentation

## 2015-12-13 DIAGNOSIS — Z7901 Long term (current) use of anticoagulants: Secondary | ICD-10-CM | POA: Diagnosis not present

## 2015-12-13 DIAGNOSIS — R0602 Shortness of breath: Secondary | ICD-10-CM

## 2015-12-13 DIAGNOSIS — N182 Chronic kidney disease, stage 2 (mild): Secondary | ICD-10-CM | POA: Insufficient documentation

## 2015-12-13 DIAGNOSIS — M5489 Other dorsalgia: Secondary | ICD-10-CM | POA: Diagnosis not present

## 2015-12-13 DIAGNOSIS — I252 Old myocardial infarction: Secondary | ICD-10-CM | POA: Insufficient documentation

## 2015-12-13 DIAGNOSIS — Z79899 Other long term (current) drug therapy: Secondary | ICD-10-CM | POA: Insufficient documentation

## 2015-12-13 DIAGNOSIS — R030 Elevated blood-pressure reading, without diagnosis of hypertension: Secondary | ICD-10-CM | POA: Diagnosis not present

## 2015-12-13 DIAGNOSIS — Z955 Presence of coronary angioplasty implant and graft: Secondary | ICD-10-CM | POA: Diagnosis not present

## 2015-12-13 DIAGNOSIS — Z87891 Personal history of nicotine dependence: Secondary | ICD-10-CM | POA: Insufficient documentation

## 2015-12-13 DIAGNOSIS — R531 Weakness: Secondary | ICD-10-CM | POA: Diagnosis not present

## 2015-12-13 DIAGNOSIS — I5032 Chronic diastolic (congestive) heart failure: Secondary | ICD-10-CM | POA: Diagnosis not present

## 2015-12-13 DIAGNOSIS — R059 Cough, unspecified: Secondary | ICD-10-CM | POA: Diagnosis present

## 2015-12-13 DIAGNOSIS — Z951 Presence of aortocoronary bypass graft: Secondary | ICD-10-CM | POA: Insufficient documentation

## 2015-12-13 DIAGNOSIS — R05 Cough: Secondary | ICD-10-CM | POA: Diagnosis present

## 2015-12-13 DIAGNOSIS — E871 Hypo-osmolality and hyponatremia: Secondary | ICD-10-CM | POA: Diagnosis present

## 2015-12-13 DIAGNOSIS — J449 Chronic obstructive pulmonary disease, unspecified: Secondary | ICD-10-CM | POA: Insufficient documentation

## 2015-12-13 DIAGNOSIS — J189 Pneumonia, unspecified organism: Principal | ICD-10-CM | POA: Insufficient documentation

## 2015-12-13 DIAGNOSIS — K529 Noninfective gastroenteritis and colitis, unspecified: Secondary | ICD-10-CM | POA: Diagnosis present

## 2015-12-13 DIAGNOSIS — I2510 Atherosclerotic heart disease of native coronary artery without angina pectoris: Secondary | ICD-10-CM | POA: Insufficient documentation

## 2015-12-13 DIAGNOSIS — G4733 Obstructive sleep apnea (adult) (pediatric): Secondary | ICD-10-CM | POA: Diagnosis present

## 2015-12-13 DIAGNOSIS — K219 Gastro-esophageal reflux disease without esophagitis: Secondary | ICD-10-CM | POA: Diagnosis present

## 2015-12-13 DIAGNOSIS — R0600 Dyspnea, unspecified: Secondary | ICD-10-CM | POA: Diagnosis present

## 2015-12-13 LAB — BRAIN NATRIURETIC PEPTIDE: B Natriuretic Peptide: 248.6 pg/mL — ABNORMAL HIGH (ref 0.0–100.0)

## 2015-12-13 LAB — URINALYSIS, ROUTINE W REFLEX MICROSCOPIC
Bilirubin Urine: NEGATIVE
GLUCOSE, UA: NEGATIVE mg/dL
KETONES UR: NEGATIVE mg/dL
Leukocytes, UA: NEGATIVE
Nitrite: NEGATIVE
PROTEIN: NEGATIVE mg/dL
Specific Gravity, Urine: 1.014 (ref 1.005–1.030)
pH: 6 (ref 5.0–8.0)

## 2015-12-13 LAB — COMPREHENSIVE METABOLIC PANEL
ALBUMIN: 3.5 g/dL (ref 3.5–5.0)
ALK PHOS: 41 U/L (ref 38–126)
ALT: 12 U/L — AB (ref 17–63)
AST: 17 U/L (ref 15–41)
Anion gap: 7 (ref 5–15)
BILIRUBIN TOTAL: 0.9 mg/dL (ref 0.3–1.2)
BUN: 11 mg/dL (ref 6–20)
CO2: 24 mmol/L (ref 22–32)
CREATININE: 0.92 mg/dL (ref 0.61–1.24)
Calcium: 8.4 mg/dL — ABNORMAL LOW (ref 8.9–10.3)
Chloride: 98 mmol/L — ABNORMAL LOW (ref 101–111)
GFR calc Af Amer: >60 mL/min (ref >60)
GLUCOSE: 100 mg/dL — AB (ref 65–99)
POTASSIUM: 3.6 mmol/L (ref 3.5–5.1)
Sodium: 129 mmol/L — ABNORMAL LOW (ref 135–145)
TOTAL PROTEIN: 5.5 g/dL — AB (ref 6.5–8.1)

## 2015-12-13 LAB — I-STAT CG4 LACTIC ACID, ED: Lactic Acid, Venous: 1.05 mmol/L (ref 0.5–1.9)

## 2015-12-13 LAB — URINE MICROSCOPIC-ADD ON: Bacteria, UA: NONE SEEN

## 2015-12-13 LAB — CBC WITH DIFFERENTIAL/PLATELET
BASOS ABS: 0 10*3/uL (ref 0.0–0.1)
Basophils Relative: 0 %
EOS ABS: 0 10*3/uL (ref 0.0–0.7)
EOS PCT: 0 %
HCT: 34.5 % — ABNORMAL LOW (ref 39.0–52.0)
Hemoglobin: 11.7 g/dL — ABNORMAL LOW (ref 13.0–17.0)
LYMPHS PCT: 7 %
Lymphs Abs: 0.8 10*3/uL (ref 0.7–4.0)
MCH: 30.4 pg (ref 26.0–34.0)
MCHC: 33.9 g/dL (ref 30.0–36.0)
MCV: 89.6 fL (ref 78.0–100.0)
MONO ABS: 0.7 10*3/uL (ref 0.1–1.0)
Monocytes Relative: 7 %
Neutro Abs: 8.8 10*3/uL — ABNORMAL HIGH (ref 1.7–7.7)
Neutrophils Relative %: 86 %
PLATELETS: 153 10*3/uL (ref 150–400)
RBC: 3.85 MIL/uL — ABNORMAL LOW (ref 4.22–5.81)
RDW: 14.9 % (ref 11.5–15.5)
WBC: 10.3 10*3/uL (ref 4.0–10.5)

## 2015-12-13 LAB — STREP PNEUMONIAE URINARY ANTIGEN: STREP PNEUMO URINARY ANTIGEN: NEGATIVE

## 2015-12-13 LAB — TROPONIN I: Troponin I: <0.03 ng/mL (ref <0.03)

## 2015-12-13 MED ORDER — SPIRONOLACTONE 25 MG PO TABS
12.5000 mg | ORAL_TABLET | Freq: Every day | ORAL | Status: DC
  Administered 2015-12-13 – 2015-12-16 (×4): 12.5 mg via ORAL
  Filled 2015-12-13 (×4): qty 1

## 2015-12-13 MED ORDER — APIXABAN 5 MG PO TABS
5.0000 mg | ORAL_TABLET | Freq: Two times a day (BID) | ORAL | Status: DC
  Administered 2015-12-13 – 2015-12-16 (×6): 5 mg via ORAL
  Filled 2015-12-13 (×6): qty 1

## 2015-12-13 MED ORDER — ALBUTEROL SULFATE (2.5 MG/3ML) 0.083% IN NEBU: 2.5000 mg | INHALATION_SOLUTION | RESPIRATORY_TRACT | Status: DC | PRN

## 2015-12-13 MED ORDER — IPRATROPIUM-ALBUTEROL 0.5-2.5 (3) MG/3ML IN SOLN: 3.0000 mL | RESPIRATORY_TRACT | Status: DC

## 2015-12-13 MED ORDER — ONDANSETRON HCL 4 MG PO TABS: 4.0000 mg | ORAL_TABLET | Freq: Four times a day (QID) | ORAL | Status: DC | PRN

## 2015-12-13 MED ORDER — DEXTROSE 5 % IV SOLN
2.0000 g | INTRAVENOUS | Status: DC
  Administered 2015-12-14 – 2015-12-15 (×2): 2 g via INTRAVENOUS
  Filled 2015-12-13 (×3): qty 2

## 2015-12-13 MED ORDER — TRAMADOL HCL 50 MG PO TABS
50.0000 mg | ORAL_TABLET | Freq: Four times a day (QID) | ORAL | Status: DC | PRN
  Administered 2015-12-14: 50 mg via ORAL
  Filled 2015-12-13: qty 1

## 2015-12-13 MED ORDER — CHOLESTYRAMINE 4 G PO PACK
8.0000 g | PACK | Freq: Every day | ORAL | Status: DC
  Administered 2015-12-14 – 2015-12-15 (×2): 8 g via ORAL
  Filled 2015-12-13 (×3): qty 2

## 2015-12-13 MED ORDER — TORSEMIDE 20 MG PO TABS
10.0000 mg | ORAL_TABLET | Freq: Every day | ORAL | Status: DC
  Administered 2015-12-13 – 2015-12-14 (×2): 10 mg via ORAL
  Filled 2015-12-13 (×2): qty 1

## 2015-12-13 MED ORDER — ACETAMINOPHEN 325 MG PO TABS
650.0000 mg | ORAL_TABLET | Freq: Four times a day (QID) | ORAL | Status: DC | PRN
  Administered 2015-12-15: 650 mg via ORAL
  Filled 2015-12-13: qty 2

## 2015-12-13 MED ORDER — PNEUMOCOCCAL VAC POLYVALENT 25 MCG/0.5ML IJ INJ: 0.5000 mL | INJECTION | INTRAMUSCULAR | Status: DC | PRN

## 2015-12-13 MED ORDER — CLOPIDOGREL BISULFATE 75 MG PO TABS
75.0000 mg | ORAL_TABLET | Freq: Every day | ORAL | Status: DC
  Administered 2015-12-14 – 2015-12-16 (×3): 75 mg via ORAL
  Filled 2015-12-13 (×2): qty 1

## 2015-12-13 MED ORDER — BISOPROLOL FUMARATE 10 MG PO TABS
20.0000 mg | ORAL_TABLET | Freq: Every day | ORAL | Status: DC
  Administered 2015-12-13 – 2015-12-16 (×4): 20 mg via ORAL
  Filled 2015-12-13 (×4): qty 2

## 2015-12-13 MED ORDER — GUAIFENESIN ER 600 MG PO TB12
600.0000 mg | ORAL_TABLET | Freq: Two times a day (BID) | ORAL | Status: DC
  Administered 2015-12-13 – 2015-12-16 (×6): 600 mg via ORAL
  Filled 2015-12-13 (×6): qty 1

## 2015-12-13 MED ORDER — PRAVASTATIN SODIUM 40 MG PO TABS
80.0000 mg | ORAL_TABLET | Freq: Every morning | ORAL | Status: DC
  Administered 2015-12-14 – 2015-12-16 (×3): 80 mg via ORAL
  Filled 2015-12-13 (×4): qty 2

## 2015-12-13 MED ORDER — ACETAMINOPHEN 650 MG RE SUPP: 650.0000 mg | Freq: Four times a day (QID) | RECTAL | Status: DC | PRN

## 2015-12-13 MED ORDER — ISOSORBIDE MONONITRATE ER 60 MG PO TB24
60.0000 mg | ORAL_TABLET | Freq: Two times a day (BID) | ORAL | Status: DC
  Administered 2015-12-13 – 2015-12-16 (×6): 60 mg via ORAL
  Filled 2015-12-13 (×6): qty 1

## 2015-12-13 MED ORDER — TRAZODONE HCL 50 MG PO TABS: 50.0000 mg | ORAL_TABLET | Freq: Every evening | ORAL | Status: DC | PRN

## 2015-12-13 MED ORDER — TRAZODONE HCL 50 MG PO TABS
25.0000 mg | ORAL_TABLET | Freq: Every evening | ORAL | Status: DC | PRN
  Administered 2015-12-13 – 2015-12-15 (×3): 25 mg via ORAL
  Filled 2015-12-13 (×3): qty 1

## 2015-12-13 MED ORDER — LATANOPROST 0.005 % OP SOLN
1.0000 [drp] | Freq: Every day | OPHTHALMIC | Status: DC
  Administered 2015-12-13 – 2015-12-15 (×3): 1 [drp] via OPHTHALMIC
  Filled 2015-12-13 (×2): qty 2.5

## 2015-12-13 MED ORDER — ONDANSETRON HCL 4 MG/2ML IJ SOLN
4.0000 mg | Freq: Four times a day (QID) | INTRAMUSCULAR | Status: DC | PRN
Start: 2015-12-13 — End: 2015-12-16

## 2015-12-13 MED ORDER — SODIUM CHLORIDE 0.9 % IV SOLN: INTRAVENOUS | Status: DC

## 2015-12-13 MED ORDER — VANCOMYCIN HCL IN DEXTROSE 1-5 GM/200ML-% IV SOLN
1000.0000 mg | Freq: Once | INTRAVENOUS | Status: AC
  Administered 2015-12-13: 1000 mg via INTRAVENOUS
  Filled 2015-12-13: qty 200

## 2015-12-13 MED ORDER — DIPHENOXYLATE-ATROPINE 2.5-0.025 MG PO TABS: 1.0000 | ORAL_TABLET | Freq: Four times a day (QID) | ORAL | Status: DC | PRN

## 2015-12-13 MED ORDER — SODIUM CHLORIDE 0.9 % IV SOLN
1000.0000 mL | INTRAVENOUS | Status: DC
  Administered 2015-12-13: 1000 mL via INTRAVENOUS

## 2015-12-13 MED ORDER — CEFEPIME HCL 2 G IJ SOLR
2.0000 g | Freq: Once | INTRAMUSCULAR | Status: AC
  Administered 2015-12-13: 2 g via INTRAVENOUS
  Filled 2015-12-13: qty 2

## 2015-12-13 MED ORDER — MOMETASONE FURO-FORMOTEROL FUM 100-5 MCG/ACT IN AERO
2.0000 | INHALATION_SPRAY | Freq: Two times a day (BID) | RESPIRATORY_TRACT | Status: DC
  Administered 2015-12-13 – 2015-12-16 (×6): 2 via RESPIRATORY_TRACT
  Filled 2015-12-13: qty 8.8

## 2015-12-13 MED ORDER — ACETAMINOPHEN 325 MG PO TABS
650.0000 mg | ORAL_TABLET | Freq: Once | ORAL | Status: AC
  Administered 2015-12-13: 650 mg via ORAL
  Filled 2015-12-13: qty 2

## 2015-12-13 MED ORDER — VANCOMYCIN HCL 500 MG IV SOLR
500.0000 mg | Freq: Two times a day (BID) | INTRAVENOUS | Status: DC
  Administered 2015-12-14 – 2015-12-16 (×5): 500 mg via INTRAVENOUS
  Filled 2015-12-13 (×8): qty 500

## 2015-12-13 MED ORDER — MONTELUKAST SODIUM 10 MG PO TABS
10.0000 mg | ORAL_TABLET | Freq: Every day | ORAL | Status: DC
  Administered 2015-12-13 – 2015-12-15 (×3): 10 mg via ORAL
  Filled 2015-12-13 (×3): qty 1

## 2015-12-13 MED ORDER — LORAZEPAM 0.5 MG PO TABS
0.5000 mg | ORAL_TABLET | Freq: Two times a day (BID) | ORAL | Status: DC
  Administered 2015-12-13 – 2015-12-16 (×6): 0.5 mg via ORAL
  Filled 2015-12-13 (×6): qty 1

## 2015-12-13 MED ORDER — SODIUM CHLORIDE 0.9 % IV SOLN: 1000.0000 mL | INTRAVENOUS | Status: DC

## 2015-12-13 MED ORDER — IRBESARTAN 300 MG PO TABS
300.0000 mg | ORAL_TABLET | Freq: Every day | ORAL | Status: DC
  Administered 2015-12-14 – 2015-12-16 (×3): 300 mg via ORAL
  Filled 2015-12-13 (×3): qty 1

## 2015-12-13 NOTE — ED Provider Notes (Signed)
[facility] DEPT Provider Note   CSN: [id number] Arrival date & time: 12/13/15  1050     History   Chief Complaint Chief Complaint  Patient presents with  . Weakness    HPI [name]. is a 82 y.o. male.  82 year old male presents with increasing weakness 2 days with worsening dyspnea as well as productive cough. No subjective fevers noted. Has had some loose stools for 2 weeks but denies any blood in the stools. Denies any chest discomfort. Has had some lower abdominal discomfort without urinary symptoms. No recent history of head trauma. Denies any headaches. No photophobia noted. Called EMS and was transported here      Past Medical History:  Diagnosis Date  . Arthritis   . ASTHMA   . Benign liver cyst   . CAD (coronary artery disease)    a. s/p CABG;  b. cath 06/27/10: S-Dx occluded, S-PDA 80-90% (tx with PCI); S-OM ok, L-LAD ok;  EF 65-70%  c.  s/p Promus DES to S-PDA 06/2010;   d. Cath 8 2016 LIMA to the LAD patent, SVG to posterior lateral subtotal, SVG to diagonal occluded chronically. The patient had stenting of the native right vessel.  . Cataract    surgery to both eyes  . Chronic diastolic CHF (congestive heart failure), NYHA class 2 ([place])   . CKD (chronic kidney disease), stage II   . COPD (chronic obstructive pulmonary disease) ([place])   . Depression with anxiety   . Diverticulosis   . DYSLIPIDEMIA   . Esophageal stricture   . Essential hypertension    Echo 3/12: EF 55-60%; mod LVH; mild AS/AI; LAE; PASP 38; mild pulmo HTN  . GERD    with HH, hx esophageal stricture  . Glaucoma   . Hiatal hernia   . Osteoporosis   . PAF (paroxysmal atrial fibrillation) ([place])   . Personal history of alcoholism ([place])    quit etoh and abstinent since age 34.   Marland Kitchen Pneumonia 04/2013 hosp  . Skin cancer    L forearm  . SLEEP APNEA 09/2001   NPSG AHI 22/HR  . Subclavian arterial stenosis ([facility])    a. s/p PTCA of subclavian 2016.    Patient Active Problem List   Diagnosis Date Noted  . Inguinal hernia, b/l 11/11/2015  . Chronic diarrhea   . Depression with anxiety   . UTI (lower urinary tract infection) 11/03/2015  . CKD (chronic kidney disease), stage II 10/21/2015  . Symptomatic bradycardia 10/17/2015  . Chronic diastolic CHF (congestive heart failure), NYHA class 2 ([place])   . COPD (chronic obstructive pulmonary disease) ([place]) 09/23/2015  . COPD exacerbation ([place]) 09/23/2015  . Sleeping difficulties 09/08/2015  . Cerebral embolism with cerebral infarction 05/18/2015  . Coronary artery disease involving native coronary artery of native heart   . Numbness and tingling in right hand 05/17/2015  . Hyponatremia 05/17/2015  . Essential hypertension   . Intermittent confusion   . Cough 04/13/2015  . PAD (peripheral artery disease) ([place]) 02/22/2015  . Subclavian artery stenosis, left 12/23/2014  . NSTEMI (non-ST elevated myocardial infarction) ([place])   . Hx of CABG 2008 08/31/2014  . CAD S/P S-PDA DES 2012 08/31/2014  . PVD (peripheral vascular disease) ([place]) 08/31/2014  . Murmur 08/31/2014  . Anxiety about health 08/31/2014  . Abnormal chest x-ray 03/26/2014  . Abnormal CT scan, esophagus 01/07/2014  . Generalized anxiety disorder   . Anemia, iron deficiency   . PAF (paroxysmal atrial fibrillation) ([place]) 03/08/2013  .  Chronic diastolic heart failure ([place]) 10/30/2012  . Osteoporosis/severe osteopenia 06/12/2012  . Bruit of left carotid artery 09/27/2011  . GERD 11/19/2009  . Hyperlipidemia with target LDL less than 100 10/04/2009  . ESOPHAGEAL STRICTURE 12/03/2007  . Coronary atherosclerosis 05/10/2007  . Seasonal and perennial allergic rhinitis 05/10/2007  . Asthma with COPD ([place]) 05/10/2007  . Obstructive sleep apnea 05/10/2007    Past Surgical History:  Procedure Laterality Date  . CARDIAC CATHETERIZATION N/A 11/26/2014   Procedure: Left Heart Cath and Cors/Grafts Angiography;  Surgeon: Sherren Mocha, MD;  Location:  [facility];  Service: Cardiovascular;  Laterality: N/A;  . CARDIAC CATHETERIZATION N/A 11/26/2014   Procedure: Coronary Stent Intervention;  Surgeon: Sherren Mocha, MD;  Location: [facility];  Service: Cardiovascular;  Laterality: N/A;  . CATARACT EXTRACTION, BILATERAL  2007  . CHOLECYSTECTOMY  02/21/2011   Procedure: LAPAROSCOPIC CHOLECYSTECTOMY WITH INTRAOPERATIVE CHOLANGIOGRAM;  Surgeon: Pedro Earls, MD;  Location: [facility]S;  Service: General;  Laterality: N/A;  . CORONARY ANGIOPLASTY WITH STENT PLACEMENT  07/2010  . CORONARY ARTERY BYPASS GRAFT  02/2007  . FLEXIBLE SIGMOIDOSCOPY N/A 11/05/2015   Procedure: FLEXIBLE SIGMOIDOSCOPY;  Surgeon: Jerene Bears, MD;  Location: [facility] ENDOSCOPY;  Service: Endoscopy;  Laterality: N/A;  . HERNIA REPAIR  unsure ?60's  . PERIPHERAL VASCULAR CATHETERIZATION N/A 02/22/2015   Procedure: Upper Extremity Angiography;  Surgeon: Lorretta Harp, MD;  Location: [facility];  Service: Cardiovascular;  Laterality: N/A;  . TONSILLECTOMY         Home Medications    Prior to Admission medications   Medication Sig Start Date End Date Taking? Authorizing Provider  acetaminophen (TYLENOL) 325 MG tablet Take 650 mg by mouth every 6 (six) hours as needed for moderate pain or headache.     Historical Provider, MD  albuterol (PROAIR HFA) 108 (90 Base) MCG/ACT inhaler Inhale 2 puffs into the lungs every 6 (six) hours as needed for wheezing or shortness of breath. 08/16/15   Deneise Lever, MD  apixaban (ELIQUIS) 5 MG TABS tablet Take 1 tablet (5 mg total) by mouth 2 (two) times daily. 05/20/15   Minus Breeding, MD  bisoprolol (ZEBETA) 10 MG tablet Take 2 tablets (20 mg total) by mouth daily. 11/03/15   Binnie Rail, MD  calcium-vitamin D (OSCAL WITH D) 500-200 MG-UNIT per tablet Take 1 tablet by mouth 2 (two) times daily.    Historical Provider, MD  cholestyramine (QUESTRAN) 4 g packet MIX AND DRINK 1 PACKET BY MOUTH TWICE A DAY IF NEEDED FOR LOOSE STOOLS Patient  taking differently: Take 8 g by mouth daily after lunch. 2 packets 07/13/15   Irene Shipper, MD  clopidogrel (PLAVIX) 75 MG tablet Take 1 tablet (75 mg total) by mouth daily with breakfast. 09/27/15   Minus Breeding, MD  dexlansoprazole (DEXILANT) 60 MG capsule Take 1 capsule (60 mg total) by mouth daily. 11/11/15   Binnie Rail, MD  diphenoxylate-atropine (LOMOTIL) 2.5-0.025 MG tablet Take 1 tablet by mouth 4 (four) times daily as needed for diarrhea or loose stools. Only take if you are having diarrhea or loose stools. 11/07/15   Maryann Mikhail, DO  famotidine (PEPCID) 20 MG tablet Take 1 tablet (20 mg total) by mouth daily. 10/21/15   Dayna N Dunn, PA-C  ipratropium-albuterol (DUONEB) 0.5-2.5 (3) MG/3ML SOLN USE ONE VIAL VIA NEBULIZER EVERY 6 HOURS AS NEEDED FOR SHORTNESS OF BREATH Patient taking differently: USE ONE VIAL VIA NEBULIZER THREE TIMES DAILY 12/17/14  Deneise Lever, MD  isosorbide mononitrate (IMDUR) 60 MG 24 hr tablet Take 1 tablet (60 mg total) by mouth 2 (two) times daily. 05/19/15   Shanker Kristeen Mans, MD  latanoprost (XALATAN) 0.005 % ophthalmic solution Place 1 drop into both eyes at bedtime.     Historical Provider, MD  LORazepam (ATIVAN) 0.5 MG tablet Take 0.5 mg by mouth 2 (two) times daily.    Historical Provider, MD  mometasone-formoterol (DULERA) 100-5 MCG/ACT AERO Inhale 2 puffs into the lungs 2 (two) times daily. Rinse mouth after use    Historical Provider, MD  montelukast (SINGULAIR) 10 MG tablet take 1 tablet by mouth at bedtime 11/30/15   Binnie Rail, MD  nitroGLYCERIN (NITROSTAT) 0.4 MG SL tablet Place 1 tablet (0.4 mg total) under the tongue every 5 (five) minutes as needed for chest pain. 07/13/14   Minus Breeding, MD  pravastatin (PRAVACHOL) 80 MG tablet Take 1 tablet (80 mg total) by mouth every morning. 11/25/15   Minus Breeding, MD  PRESCRIPTION MEDICATION Inhale into the lungs at bedtime. CPAP/ 12    Historical Provider, MD  PRESCRIPTION MEDICATION Inject 1 each into the  skin once a week. Allergy shots from Dr. Annamaria Boots (1:10) - self-administered - usually Thursday or Friday    Historical Provider, MD  Probiotic Product (PROBIOTIC DAILY) CAPS Take 1 capsule by mouth 2 (two) times daily.     Historical Provider, MD  RA VITAMIN B-12 TR 1000 MCG TBCR take 1 tablet by mouth once daily 01/23/14   Rowe Clack, MD  ranitidine (ZANTAC) 150 MG tablet Take 1 tablet (150 mg total) by mouth 2 (two) times daily. 11/11/15   Binnie Rail, MD  spironolactone (ALDACTONE) 25 MG tablet Take 0.5 tablets (12.5 mg total) by mouth daily. 12/07/15   Binnie Rail, MD  torsemide (DEMADEX) 20 MG tablet Take 0.5 tablets (10 mg total) by mouth daily. 12/07/15   Binnie Rail, MD  traMADol (ULTRAM) 50 MG tablet Take 1 tablet (50 mg total) by mouth every 8 (eight) hours as needed. 12/07/15   Binnie Rail, MD  traZODone (DESYREL) 50 MG tablet Take 1 tablet (50 mg total) by mouth at bedtime as needed for sleep. 11/03/15   Binnie Rail, MD  valsartan (DIOVAN) 320 MG tablet Take 1 tablet (320 mg total) by mouth daily. 08/10/15   Binnie Rail, MD    Family History Family History  Problem Relation Age of Onset  . COPD Sister   . Asthma Sister   . Emphysema Sister   . Hypertension Sister   . Hyperlipidemia Sister   . Hypertension Mother   . Heart disease Mother   . Hyperlipidemia Mother   . Heart disease Father   . Hyperlipidemia Father   . Hypertension Father   . Heart disease Brother   . Colon cancer Neg Hx     Social History Social History  Substance Use Topics  . Smoking status: Former Smoker    Packs/day: 3.00    Years: 40.00    Types: Cigarettes    Quit date: 04/11/1979  . Smokeless tobacco: Never Used  . Alcohol use No     Comment: quit drinking 40+ years     Allergies   Ace inhibitors; Avelox [moxifloxacin hcl in nacl]; Tape; Lidocaine; Morphine and related; Codeine; Lasix [furosemide]; and Other   Review of Systems Review of Systems  All other systems reviewed and  are negative.    Physical Exam Updated  Vital Signs BP 141/57 (BP Location: Left Arm)   Pulse 92   Temp 100.3 F (37.9 C) (Oral)   Resp (!) 27   Ht 5' (1.524 m)   Wt 66.2 kg   SpO2 93%   BMI 28.51 kg/m   Physical Exam  Constitutional: He is oriented to person, place, and time. He appears well-developed and well-nourished.  Non-toxic appearance. No distress.  HENT:  Head: Normocephalic and atraumatic.  Eyes: Conjunctivae, EOM and lids are normal. Pupils are equal, round, and reactive to light.  Neck: Normal range of motion. Neck supple. No tracheal deviation present. No thyroid mass present.  Cardiovascular: Normal rate, regular rhythm and normal heart sounds.  Exam reveals no gallop.   No murmur heard. Pulmonary/Chest: Effort normal. No stridor. No respiratory distress. He has decreased breath sounds in the right lower field and the left lower field. He has wheezes in the right lower field and the left lower field. He has no rhonchi. He has no rales.  Abdominal: Soft. Normal appearance and bowel sounds are normal. He exhibits no distension. There is no tenderness. There is no rebound and no CVA tenderness.  Musculoskeletal: Normal range of motion. He exhibits no edema or tenderness.  Neurological: He is alert and oriented to person, place, and time. He has normal strength. No cranial nerve deficit or sensory deficit. GCS eye subscore is 4. GCS verbal subscore is 5. GCS motor subscore is 6.  Skin: Skin is warm and dry. No abrasion and no rash noted.  Psychiatric: His affect is blunt. His speech is delayed. He is slowed.  Nursing note and vitals reviewed.    ED Treatments / Results  Labs (all labs ordered are listed, but only abnormal results are displayed) Labs Reviewed  CULTURE, BLOOD (ROUTINE X 2)  CULTURE, BLOOD (ROUTINE X 2)  URINE CULTURE  COMPREHENSIVE METABOLIC PANEL  CBC WITH DIFFERENTIAL/PLATELET  URINALYSIS, ROUTINE W REFLEX MICROSCOPIC (NOT AT [facility])  BRAIN  NATRIURETIC PEPTIDE  TROPONIN I  I-STAT CG4 LACTIC ACID, ED    EKG  EKG Interpretation None       Radiology No results found.  Procedures Procedures (including critical care time)  Medications Ordered in ED Medications  0.9 %  sodium chloride infusion (not administered)     Initial Impression / Assessment and Plan / ED Course  I have reviewed the triage vital signs and the nursing notes.  Pertinent labs & imaging results that were available during my care of the patient were reviewed by me and considered in my medical decision making (see chart for details).  Clinical Course    Patient's chest x-ray consistent with pneumonia. Start on IV antibiotics and will be admitted by the triad hospitalist service  Final Clinical Impressions(s) / ED Diagnoses   Final diagnoses:  None    New Prescriptions New Prescriptions   No medications on file     Lacretia Leigh, MD 12/13/15 1401

## 2015-12-13 NOTE — Progress Notes (Signed)
Pharmacy Antibiotic Note  [name]. is a 82 y.o. male admitted on 12/13/2015 with pneumonia.  Pharmacy has been consulted for Vancomycin and Cefepime dosing. No leukocytosis. Tmax 100.3. Patient reports dyspnea and productive cough. Blood and urine cultures are ordered. No sputum culture yet ordered. CXR shows RLL pneumonia.   Plan: Vancomycin 1g IV now then 500mg  IV every 12 hours.  Goal trough 15-20 mcg/mL. Cefepime 2g IV now then 2g every 24 hours.  Monitor renal function, culture results, and clinical status.   Height: 5' (152.4 cm) Weight: 146 lb (66.2 kg) IBW/kg (Calculated) : 50  Temp (24hrs), Avg:100.3 F (37.9 C), Min:100.3 F (37.9 C), Max:100.3 F (37.9 C)   Recent Labs Lab 12/07/15 1502 12/13/15 1059 12/13/15 1129  WBC  --  10.3  --   CREATININE 0.92 0.92  --   LATICACIDVEN  --   --  1.05    Estimated Creatinine Clearance: 49.5 mL/min (by C-G formula based on SCr of 0.92 mg/dL).    Allergies  Allergen Reactions  . Ace Inhibitors Other (See Comments)    Severe asthma (COPD)  . Avelox [Moxifloxacin Hcl In Nacl] Other (See Comments)    Gum pain  . Tape Other (See Comments)    Adhesive and tape reaction-tears skin off   . Lidocaine Other (See Comments)    Possible reaction? Dizziness, confusion, syncope  . Morphine And Related Nausea And Vomiting  . Codeine Nausea And Vomiting  . Lasix [Furosemide] Itching and Swelling  . Other Other (See Comments)    Maple trees, allergy symptoms    Antimicrobials this admission: Vancomycin 9/4 >> Cefepime 9/4 >>  Dose adjustments this admission: none  Microbiology results: 9/4 BCx:  9/4 UCx:    Sputum:   MRSA PCR:  Thank you for allowing pharmacy to be a part of this patient's care.  Sloan Leiter, PharmD, BCPS Clinical Pharmacist [phone] 12/13/2015 1:47 PM

## 2015-12-13 NOTE — Progress Notes (Signed)
Pt. refused CPAP @ this time, made aware to call if wanting set up, RN aware @ bedside.

## 2015-12-13 NOTE — H&P (Signed)
History and Physical    [name]. [id number] DOB: [birth year] DOA: 12/13/2015  PCP: Binnie Rail, MD  Patient coming from:   Home  Chief Complaint: Generalized weakness, shortness of breath  HPI: [name]. is a 82 y.o. male with multiple medical problems not limited to coronary artery disease status post remote CABG and stent placement in 0000000, chronic diastolic heart failure, PAF, COPD. Hospitalized a few times over the summer for hyponatremia, diarrhea, UTI. Patient was last discharged late July. Since discharge patient has been weaker than normal per wife . Patient does not like to sit around but rather get out and about. Wife feels his gait is unsteady. Patient has had a cough for nearly 3 months. Cough productive of white sputum.  Yesterday evening the patient began getting short of breath at rest . No chest pain but he does complain of pain in his right mid to upper back .   Patient's diarrhea persists. He has 3-4 loose bowel movements a day. He complains of pain in left inguinal area. He has bilateral inguinal hernias, waiting to see a Psychologist, sport and exercise.   ED Course:  Temp 100.3, respirations 19-27, normotensive, O2 sats 95% on room air Received Tylenol, maximum time and IV vancomycin Getting normal saline 125 per hour BNP 248 Urinalysis unremarkable Sodium 129 stable since early August Normal white count and hemoglobin  Chest x-ray right lower lobe lobar pneumonia-   Review of Systems: As per HPI, otherwise 10 point review of systems negative.    Past Medical History:  Diagnosis Date  . Arthritis   . ASTHMA   . Benign liver cyst   . CAD (coronary artery disease)    a. s/p CABG;  b. cath 06/27/10: S-Dx occluded, S-PDA 80-90% (tx with PCI); S-OM ok, L-LAD ok;  EF 65-70%  c.  s/p Promus DES to S-PDA 06/2010;   d. Cath 8 2016 LIMA to the LAD patent, SVG to posterior lateral subtotal, SVG to diagonal occluded chronically. The patient had stenting of the native right  vessel.  . Cataract    surgery to both eyes  . Chronic diastolic CHF (congestive heart failure), NYHA class 2 ([place])   . CKD (chronic kidney disease), stage II   . COPD (chronic obstructive pulmonary disease) ([place])   . Depression with anxiety   . Diverticulosis   . DYSLIPIDEMIA   . Esophageal stricture   . Essential hypertension    Echo 3/12: EF 55-60%; mod LVH; mild AS/AI; LAE; PASP 38; mild pulmo HTN  . GERD    with HH, hx esophageal stricture  . Glaucoma   . Hiatal hernia   . Osteoporosis   . PAF (paroxysmal atrial fibrillation) ([place])   . Personal history of alcoholism ([place])    quit etoh and abstinent since age 20.   Marland Kitchen Pneumonia 04/2013 hosp  . Skin cancer    L forearm  . SLEEP APNEA 09/2001   NPSG AHI 22/HR  . Subclavian arterial stenosis (HCC)    a. s/p PTCA of subclavian 2016.    Past Surgical History:  Procedure Laterality Date  . CARDIAC CATHETERIZATION N/A 11/26/2014   Procedure: Left Heart Cath and Cors/Grafts Angiography;  Surgeon: Sherren Mocha, MD;  Location: [facility] [facility];  Service: Cardiovascular;  Laterality: N/A;  . CARDIAC CATHETERIZATION N/A 11/26/2014   Procedure: Coronary Stent Intervention;  Surgeon: Sherren Mocha, MD;  Location: [place] [facility];  Service: Cardiovascular;  Laterality: N/A;  . CATARACT EXTRACTION, BILATERAL  2007  . CHOLECYSTECTOMY  02/21/2011   Procedure: LAPAROSCOPIC CHOLECYSTECTOMY WITH INTRAOPERATIVE CHOLANGIOGRAM;  Surgeon: Pedro Earls, MD;  Location: [facility];  Service: General;  Laterality: N/A;  . CORONARY ANGIOPLASTY WITH STENT PLACEMENT  07/2010  . CORONARY ARTERY BYPASS GRAFT  02/2007  . FLEXIBLE SIGMOIDOSCOPY N/A 11/05/2015   Procedure: FLEXIBLE SIGMOIDOSCOPY;  Surgeon: Jerene Bears, MD;  Location[facility];  Service: Endoscopy;  Laterality: N/A;  . HERNIA REPAIR  unsure ?60's  . PERIPHERAL VASCULAR CATHETERIZATION N/A 02/22/2015   Procedure: Upper Extremity Angiography;  Surgeon: Lorretta Harp, MD;  Location:  [place] [facility];  Service: Cardiovascular;  Laterality: N/A;  . TONSILLECTOMY      Social History   Social History  . Marital status: Married    Spouse name: N/A  . Number of children: 2  . Years of education: N/A   Occupational History  . retired Retired    85 years   Social History Main Topics  . Smoking status: Former Smoker    Packs/day: 3.00    Years: 40.00    Types: Cigarettes    Quit date: 04/11/1979  . Smokeless tobacco: Never Used  . Alcohol use No     Comment: quit drinking 40+ years  . Drug use: No  . Sexual activity: Yes   Other Topics Concern  . 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 Married lives with wife 2 children   Served 3 yrs in [employer], enterred service age 44, deployed to [place] age 36.     Lives at home with wife. Has a cane but does not usually use it for walking  Allergies  Allergen Reactions  . Ace Inhibitors Other (See Comments)    Severe asthma (COPD)  . Avelox [Moxifloxacin Hcl In Nacl] Other (See Comments)    Gum pain  . Tape Other (See Comments)    Adhesive and tape reaction-tears skin off   . Lidocaine Other (See Comments)    Possible reaction? Dizziness, confusion, syncope  . Morphine And Related Nausea And Vomiting  . Codeine Nausea And Vomiting  . Lasix [Furosemide] Itching and Swelling  . Other Other (See Comments)    Maple trees, allergy symptoms    Family History  Problem Relation Age of Onset  . COPD Sister   . Asthma Sister   . Emphysema Sister   . Hypertension Sister   . Hyperlipidemia Sister   . Hypertension Mother   . Heart disease Mother   . Hyperlipidemia Mother   . Heart disease Father   . Hyperlipidemia Father   . Hypertension Father   . Heart disease Brother   . Colon cancer Neg Hx     Prior to Admission medications   Medication Sig Start Date End Date Taking? Authorizing Provider  acetaminophen (TYLENOL) 325 MG tablet Take 650 mg by mouth every 6 (six)  hours as needed for moderate pain or headache.    Yes Historical Provider, MD  albuterol (PROAIR HFA) 108 (90 Base) MCG/ACT inhaler Inhale 2 puffs into the lungs every 6 (six) hours as needed for wheezing or shortness of breath. 08/16/15  Yes Deneise Lever, MD  apixaban (ELIQUIS) 5 MG TABS tablet Take 1 tablet (5 mg total) by mouth 2 (two) times daily. 05/20/15  Yes Minus Breeding, MD  bisoprolol (ZEBETA) 10 MG tablet Take 2 tablets (20 mg total) by mouth daily. 11/03/15  Yes Binnie Rail, MD  calcium-vitamin  D (OSCAL WITH D) 500-200 MG-UNIT per tablet Take 1 tablet by mouth 2 (two) times daily.   Yes Historical Provider, MD  cholestyramine (QUESTRAN) 4 g packet MIX AND DRINK 1 PACKET BY MOUTH TWICE A DAY IF NEEDED FOR LOOSE STOOLS Patient taking differently: Take 8 g by mouth daily after lunch. 2 packets 07/13/15  Yes Irene Shipper, MD  clopidogrel (PLAVIX) 75 MG tablet Take 1 tablet (75 mg total) by mouth daily with breakfast. 09/27/15  Yes Minus Breeding, MD  dexlansoprazole (DEXILANT) 60 MG capsule Take 1 capsule (60 mg total) by mouth daily. 11/11/15  Yes Binnie Rail, MD  diphenoxylate-atropine (LOMOTIL) 2.5-0.025 MG tablet Take 1 tablet by mouth 4 (four) times daily as needed for diarrhea or loose stools. Only take if you are having diarrhea or loose stools. 11/07/15  Yes Maryann Mikhail, DO  ipratropium-albuterol (DUONEB) 0.5-2.5 (3) MG/3ML SOLN USE ONE VIAL VIA NEBULIZER EVERY 6 HOURS AS NEEDED FOR SHORTNESS OF BREATH 12/17/14  Yes Deneise Lever, MD  isosorbide mononitrate (IMDUR) 60 MG 24 hr tablet Take 1 tablet (60 mg total) by mouth 2 (two) times daily. 05/19/15  Yes Shanker Kristeen Mans, MD  latanoprost (XALATAN) 0.005 % ophthalmic solution Place 1 drop into both eyes at bedtime.    Yes Historical Provider, MD  LORazepam (ATIVAN) 0.5 MG tablet Take 0.5 mg by mouth 2 (two) times daily.   Yes Historical Provider, MD  mometasone-formoterol (DULERA) 100-5 MCG/ACT AERO Inhale 2 puffs into the lungs 2 (two)  times daily. Rinse mouth after use   Yes Historical Provider, MD  montelukast (SINGULAIR) 10 MG tablet take 1 tablet by mouth at bedtime 11/30/15  Yes Binnie Rail, MD  nitroGLYCERIN (NITROSTAT) 0.4 MG SL tablet Place 1 tablet (0.4 mg total) under the tongue every 5 (five) minutes as needed for chest pain. 07/13/14  Yes Minus Breeding, MD  pravastatin (PRAVACHOL) 80 MG tablet Take 1 tablet (80 mg total) by mouth every morning. 11/25/15  Yes Minus Breeding, MD  PRESCRIPTION MEDICATION Inhale into the lungs at bedtime. CPAP/ 12   Yes Historical Provider, MD  PRESCRIPTION MEDICATION Inject 1 each into the skin once a week. Allergy shots from Dr. Annamaria Boots (1:10) - self-administered - usually Thursday or Friday   Yes Historical Provider, MD  Probiotic Product (PROBIOTIC DAILY) CAPS Take 1 capsule by mouth 2 (two) times daily.    Yes Historical Provider, MD  RA VITAMIN B-12 TR 1000 MCG TBCR take 1 tablet by mouth once daily 01/23/14  Yes Rowe Clack, MD  ranitidine (ZANTAC) 150 MG tablet Take 1 tablet (150 mg total) by mouth 2 (two) times daily. 11/11/15  Yes Binnie Rail, MD  spironolactone (ALDACTONE) 25 MG tablet Take 0.5 tablets (12.5 mg total) by mouth daily. 12/07/15  Yes Binnie Rail, MD  torsemide (DEMADEX) 20 MG tablet Take 0.5 tablets (10 mg total) by mouth daily. 12/07/15  Yes Binnie Rail, MD  traZODone (DESYREL) 50 MG tablet Take 1 tablet (50 mg total) by mouth at bedtime as needed for sleep. 11/03/15  Yes Binnie Rail, MD  valsartan (DIOVAN) 320 MG tablet Take 1 tablet (320 mg total) by mouth daily. 08/10/15  Yes Binnie Rail, MD  traMADol (ULTRAM) 50 MG tablet Take 1 tablet (50 mg total) by mouth every 8 (eight) hours as needed. 12/07/15   Binnie Rail, MD    Physical Exam: Vitals:   12/13/15 1104 12/13/15 1215 12/13/15 1300 12/13/15 1445  BP:  (!) 149/52 153/60 (!) 140/47  Pulse:  94 92 100  Resp:  19    Temp:      TempSrc:      SpO2:  95% 96% 95%  Weight: 66.2 kg (146 lb)       Height: 5' (1.524 m)       Constitutional:  Pleasant, white male in NAD, calm, comfortable Vitals:   12/13/15 1104 12/13/15 1215 12/13/15 1300 12/13/15 1445  BP:  (!) 149/52 153/60 (!) 140/47  Pulse:  94 92 100  Resp:  19    Temp:      TempSrc:      SpO2:  95% 96% 95%  Weight: 66.2 kg (146 lb)     Height: 5' (1.524 m)      Eyes: PER, lids and conjunctivae normal ENMT: Mucous membranes are moist. Posterior pharynx clear of any exudate or lesions..  Neck: normal, supple, no masses Respiratory: Right lower lobe crackles , lungs otherwise CTA.   able to speak in complete sentences without becoming short of breath   Cardiovascular: Regular rate and rhythm, no murmurs / rubs / gallops. No extremity edema. 2+ dorsal pedis pulses.   Abdomen: no tenderness, no masses palpated. No hepatomegaly. Bowel sounds positive.  Musculoskeletal: no clubbing / cyanosis. No joint deformity upper and lower extremities. Good ROM, no contractures. Normal muscle tone.  Skin: no rashes, lesions, ulcers.  Neurologic: CN 2-12 grossly intact. Sensation intact, Strength 5/5 in all 4.  Psychiatric: Normal judgment and insight. Alert and oriented x 3. Normal mood.   Labs on Admission: I have personally reviewed following labs and imaging studies  Radiological Exams on Admission: Dg Chest 2 View  Result Date: 12/13/2015 CLINICAL DATA:  82 year old male with a history of labored breathing EXAM: CHEST  2 VIEW COMPARISON:  11/04/2015, 10/17/2015 FINDINGS: Cardiomediastinal silhouette unchanged. Surgical changes of prior median sternotomy and CABG. Calcifications of the aortic arch. No evidence of central vascular congestion. Interval development of airspace disease of the right lower lobe of the right lung. No pneumothorax. No pleural effusion. Left lung relatively well aerated. No displaced fracture. Unchanged configuration of the thoracic vertebral bodies. IMPRESSION: Lobar pneumonia of the right lower lobe. Aortic  atherosclerosis Surgical changes of median sternotomy and CABG. Signed, Dulcy Fanny. Earleen Newport, DO [facility] Electronically Signed   By: Corrie Mckusick D.O.   On: 12/13/2015 13:38    EKG: Independently reviewed.   EKG Interpretation  Date/Time:  Monday December 13 2015 10:58:33 EDT Ventricular Rate:  95 PR Interval:    QRS Duration: 105 QT Interval:  343 QTC Calculation: 432 R Axis:   52 Text Interpretation:  Sinus rhythm Prolonged PR interval RSR' in V1 or V2, probably normal variant Probable anteroseptal infarct, old Confirmed by Zenia Resides  MD, ANTHONY ([id number]) on 12/13/2015 1:10:59 PM      Assessment/Plan   Active Problems:   Asthma with COPD ([place])   GERD   Obstructive sleep apnea   Chronic diastolic heart failure (HCC)   Cough   Hyponatremia   CKD (chronic kidney disease), stage II   Chronic diarrhea   HCAP (healthcare-associated pneumonia)      Dyspnea, likely related to RLL HCAP though he does have underlying COPD.  -Place in OBS - medical bed  -HCAP order set utilized -follow up on blood and sputum gram stain / culture -follow up on strep pneumo ur ag -conitnue Vanco / cefepime -prn 02 -mucinex as needed  Asthma / COPD . No wheezing on exam -continue home Singulair, dulera, duoneb -prn 02  OSA -continue cpap  Chronic diastolic heart failure. No evidence for pulmonary edema on cxr -Intake and output -daily weights -continue home low dose aldactone and demadex (sodium is stable at 129) -Takes diovan at home will substitute Avapro inpatient. Continue imdur -am bmet to monitor electrolytes / renal function. -Will d/c IVF started in ED.   CAD, remote CABG, stenting placement 2012. On plavix -continue statin, plavix  Paroxysmal AFib, chadsvasc 4-5. Rate controlled. -continue zebeta -continue Eliquis.   Chronic hyponatremia. Diuretics held during last admission with improvement in sodium. Back on diuretics  and Na+ has remained stable at 129 today. He had been waiting to see Nephrology as outpatient but family expressing interest to have consult with Nephrology this admission. .   CKD?  This is listed in [place] but Cr and GFR have been normal since last month  Chronic diarrhea. Seen by GI in July. C-diff negative, no evidence for pancreatic insuff. Recent flex sigmoidoscopy with biopsies was unremarkable.  -continue home lomotil. -he also takes Questran which may be for hyperlipidemia but as bile acid binder it can also help with diarrhea   Hyperlipidemia -continue home pravachol.   GERD -continue home PPI                                   DVT prophylaxis:    On Eliquis  Code Status:     Full code  Family Communication:    Treatment plan discussed with  Wife and daughter in the room and they understand and agree with the plan..  Disposition Plan:   Discharge home in 24-48 hours              Consults called:  None  Admission status:   Observation - Medical   Tye Savoy NP [facility] Pager [phone]  If 7PM-7AM, please contact night-coverage [email or web address] Password [email or web address]  12/13/2015, 3:00 PM

## 2015-12-13 NOTE — ED Triage Notes (Signed)
Per [facility] patient complains of generalized weakness and back pain that started this morning.  EMS states that family reported the patient has been complaining of feeling weak for several days.  Patient reports diarrhea x2 weeks.Patient alert and oriented at this time.

## 2015-12-14 ENCOUNTER — Telehealth: Payer: Self-pay | Admitting: Internal Medicine

## 2015-12-14 DIAGNOSIS — K219 Gastro-esophageal reflux disease without esophagitis: Secondary | ICD-10-CM

## 2015-12-14 DIAGNOSIS — I5032 Chronic diastolic (congestive) heart failure: Secondary | ICD-10-CM | POA: Diagnosis not present

## 2015-12-14 DIAGNOSIS — K529 Noninfective gastroenteritis and colitis, unspecified: Secondary | ICD-10-CM | POA: Diagnosis not present

## 2015-12-14 LAB — BASIC METABOLIC PANEL
Anion gap: 8 (ref 5–15)
BUN: 11 mg/dL (ref 6–20)
CALCIUM: 8.5 mg/dL — AB (ref 8.9–10.3)
CO2: 23 mmol/L (ref 22–32)
CREATININE: 1.04 mg/dL (ref 0.61–1.24)
Chloride: 101 mmol/L (ref 101–111)
Glucose, Bld: 109 mg/dL — ABNORMAL HIGH (ref 65–99)
Potassium: 3.3 mmol/L — ABNORMAL LOW (ref 3.5–5.1)
SODIUM: 132 mmol/L — AB (ref 135–145)

## 2015-12-14 LAB — URINE CULTURE: CULTURE: NO GROWTH

## 2015-12-14 LAB — CBC
HCT: 33.4 % — ABNORMAL LOW (ref 39.0–52.0)
Hemoglobin: 11 g/dL — ABNORMAL LOW (ref 13.0–17.0)
MCH: 30.1 pg (ref 26.0–34.0)
MCHC: 32.9 g/dL (ref 30.0–36.0)
MCV: 91.5 fL (ref 78.0–100.0)
PLATELETS: 158 10*3/uL (ref 150–400)
RBC: 3.65 MIL/uL — AB (ref 4.22–5.81)
RDW: 15.2 % (ref 11.5–15.5)
WBC: 12.3 10*3/uL — AB (ref 4.0–10.5)

## 2015-12-14 LAB — HIV ANTIBODY (ROUTINE TESTING W REFLEX): HIV SCREEN 4TH GENERATION: NONREACTIVE

## 2015-12-14 MED ORDER — PANTOPRAZOLE SODIUM 40 MG PO TBEC
40.0000 mg | DELAYED_RELEASE_TABLET | Freq: Two times a day (BID) | ORAL | Status: DC
  Administered 2015-12-14 – 2015-12-16 (×5): 40 mg via ORAL
  Filled 2015-12-14 (×5): qty 1

## 2015-12-14 NOTE — Telephone Encounter (Signed)
noted 

## 2015-12-14 NOTE — Care Management Obs Status (Signed)
[place] NOTIFICATION   Patient Details  Name: [name]. MRN: [id number] Date of Birth: January 02, 1931   Medicare Observation Status Notification Given:  Yes    Henli Hey, Rory Percy, RN 12/14/2015, 2:10 PM

## 2015-12-14 NOTE — Telephone Encounter (Signed)
FYI:  States patient is at [facility] hospital right now for pneumonia.  Did notify wife to make fu appt with burns once patient is out.

## 2015-12-14 NOTE — Progress Notes (Signed)
[name]. [id number] DOB: 11-14-1930 DOA: 12/13/2015 PCP: Binnie Rail, MD  Brief narrative: 82 y/o ? CABG 2008 + stent 2012 as well as 2016 PCI to Subclavian for stenosis [id number] Chr Diastolic HF Chr Hyponatremia Labile BP Acute CVA 05/2015 admission OSA on cpap P Afib chad2vasc2 =5/ Eliquis CKD stg II Prior esophageal strciture H/o ETOH PVD COPD  Last Hospitalized 7/26-->7/30 Also admitted by cardiology for 7/9-->7/13 with symptomatic bradycardia  Re-admitted 12/14/15 for HCAP and chr Hyponatremia  Past medical history-As per Problem list Chart reviewed as below-   Consultants:  none  Procedures:  none  Antibiotics:  Vancomycin 9/4  Cefepime 9/4   Subjective   Doing ok Had sharp sticking pains in his upper back yesterday prompting visit to hospital Now resolved Tells me has had on-going diarrhea for about 1-2 mo-seems to recur when he gets home-seems to resolve in the hospital No current CP No n/v Able to eat full breakfast this am States overall feels better but has chronic weakness  Objective     Objective: Vitals:   12/13/15 1707 12/13/15 2001 12/13/15 2104 12/14/15 0608  BP: (!) 150/57 (!) 168/50  (!) 160/59  Pulse: 86 93  80  Resp: (!) 21 19  16   Temp: 98.9 F (37.2 C) 98.5 F (36.9 C)  98.4 F (36.9 C)  TempSrc: Oral Oral  Oral  SpO2: 96% 97% 97% 94%  Weight:  68.6 kg (151 lb 3.8 oz)    Height:        Intake/Output Summary (Last 24 hours) at 12/14/15 0744 Last data filed at 12/14/15 0609  Gross per 24 hour  Intake             1060 ml  Output             1100 ml  Net              -40 ml    Exam:  General: alert pleasant slight hoh, no ict no pallor, looks about states age, no jvd Cardiovascular: hsm across pre-cordium.  s1  s2 RRR Respiratory: clear cannot appreciate any added rales or wheeze Abdomen:  Soft nt nd no rebound-hernia's noted Skin no le edema Neuro intact, no gross defcit currently  Data  Reviewed: Basic Metabolic Panel:  Recent Labs Lab 12/07/15 1502 12/13/15 1059 12/14/15 0459  NA 127* 129* 132*  K 3.9 3.6 3.3*  CL 91* 98* 101  CO2 31 24 23   GLUCOSE 98 100* 109*  BUN 16 11 11   CREATININE 0.92 0.92 1.04  CALCIUM 8.9 8.4* 8.5*   Liver Function Tests:  Recent Labs Lab 12/13/15 1059  AST 17  ALT 12*  ALKPHOS 41  BILITOT 0.9  PROT 5.5*  ALBUMIN 3.5   No results for input(s): LIPASE, AMYLASE in the last 168 hours. No results for input(s): AMMONIA in the last 168 hours. CBC:  Recent Labs Lab 12/13/15 1059 12/14/15 0459  WBC 10.3 12.3*  NEUTROABS 8.8*  --   HGB 11.7* 11.0*  HCT 34.5* 33.4*  MCV 89.6 91.5  PLT 153 158   Cardiac Enzymes:  Recent Labs Lab 12/13/15 1059  TROPONINI <0.03   BNP: Invalid input(s): POCBNP CBG: No results for input(s): GLUCAP in the last 168 hours.  No results found for this or any previous visit (from the past 240 hour(s)).   Studies:              All Imaging reviewed and is as per  above notation   Scheduled Meds: . apixaban  5 mg Oral BID  . bisoprolol  20 mg Oral Daily  . ceFEPime (MAXIPIME) IV  2 g Intravenous Q24H  . cholestyramine  8 g Oral QPC lunch  . clopidogrel  75 mg Oral Q breakfast  . guaiFENesin  600 mg Oral BID  . irbesartan  300 mg Oral Daily  . isosorbide mononitrate  60 mg Oral BID  . latanoprost  1 drop Both Eyes QHS  . LORazepam  0.5 mg Oral BID  . mometasone-formoterol  2 puff Inhalation BID  . montelukast  10 mg Oral QHS  . pravastatin  80 mg Oral q morning - 10a  . spironolactone  12.5 mg Oral Daily  . torsemide  10 mg Oral Daily  . vancomycin  500 mg Intravenous Q12H   Continuous Infusions:    Assessment/Plan:  1. HCAP-continue Rx with Vancomycin/Cefepime.  Get CBC am.  Eating/drinking so NSL 2. COPD stg "b-C"-cont Albuterol 2.5 q 2, Dulera 2 pff bid, Singulair 10 daily and prn Mucinex-no wheeze today 3. CAD-CABG 2008/Stents 2012/2016-continue Imdur 60 bid, bisoprolol 20  daily 4. Afib chad2vasc2 5 on ellquis-cont meds as above 5. Htn-cont Irbesaratan 300 daily 6. Chr diastolic hf-as above-strict I/o-would use only Aldactone 12.5 daily at this stage as Torsemide can wash out his sodium 7. Prior CVA 05/2015-on eliquis now + plavix 75 daily 8. Prior esophageal strictures-sub Deslanzoprazole for protonix bid 9. Chr diarr-unclear cause-could be med related-states has been having these symptoms for a cpl of months-continue Questran 8 ng q lunch-time.  Monitor-has been seen in the past by GI Dr. Hendricks Milo flex sig 7/27 which did not reveal anything on pathology-For ROV 12/20/15 Dr Raquel James 10. HLD-continue Pravachol 80 daily Chr Hyponatremia-note Sodium now 132-would recheck in am-no further work-up required if stays in 128-130 range and can follow up if needed with nephro as OP.  If dropping, then will need nephro input- see above re: Torsemide 11. Insomnia and bipolar-cont Trazodone 25 qhs, Lorazepam 0.5 bid 12. Bilateral inguinal hernia-for surgery as OP  Discussed with patient at bedside-no family + Await labs and clinical course-if better d/c home 48 hrs Full code   Verneita Griffes, MD  [facility] Pager [phone] 12/14/2015, 7:44 AM    LOS: 0 days

## 2015-12-14 NOTE — Progress Notes (Signed)
Pt continues to refuse CPAP QHS.  RT to monitor and assess as needed.  

## 2015-12-15 ENCOUNTER — Ambulatory Visit: Payer: Medicare Other | Admitting: Internal Medicine

## 2015-12-15 DIAGNOSIS — N182 Chronic kidney disease, stage 2 (mild): Secondary | ICD-10-CM

## 2015-12-15 DIAGNOSIS — K529 Noninfective gastroenteritis and colitis, unspecified: Secondary | ICD-10-CM | POA: Diagnosis not present

## 2015-12-15 DIAGNOSIS — J45909 Unspecified asthma, uncomplicated: Secondary | ICD-10-CM

## 2015-12-15 DIAGNOSIS — I5032 Chronic diastolic (congestive) heart failure: Secondary | ICD-10-CM

## 2015-12-15 DIAGNOSIS — J189 Pneumonia, unspecified organism: Principal | ICD-10-CM

## 2015-12-15 DIAGNOSIS — E871 Hypo-osmolality and hyponatremia: Secondary | ICD-10-CM

## 2015-12-15 DIAGNOSIS — J449 Chronic obstructive pulmonary disease, unspecified: Secondary | ICD-10-CM

## 2015-12-15 DIAGNOSIS — R05 Cough: Secondary | ICD-10-CM

## 2015-12-15 LAB — CBC WITH DIFFERENTIAL/PLATELET
BASOS ABS: 0 10*3/uL (ref 0.0–0.1)
Basophils Relative: 0 %
Eosinophils Absolute: 0.1 10*3/uL (ref 0.0–0.7)
Eosinophils Relative: 1 %
HEMATOCRIT: 30.9 % — AB (ref 39.0–52.0)
HEMOGLOBIN: 10 g/dL — AB (ref 13.0–17.0)
LYMPHS ABS: 1.2 10*3/uL (ref 0.7–4.0)
LYMPHS PCT: 15 %
MCH: 29.9 pg (ref 26.0–34.0)
MCHC: 32.4 g/dL (ref 30.0–36.0)
MCV: 92.2 fL (ref 78.0–100.0)
Monocytes Absolute: 0.8 10*3/uL (ref 0.1–1.0)
Monocytes Relative: 11 %
NEUTROS ABS: 5.8 10*3/uL (ref 1.7–7.7)
Neutrophils Relative %: 73 %
Platelets: 161 10*3/uL (ref 150–400)
RBC: 3.35 MIL/uL — AB (ref 4.22–5.81)
RDW: 15.1 % (ref 11.5–15.5)
WBC: 7.9 10*3/uL (ref 4.0–10.5)

## 2015-12-15 LAB — BASIC METABOLIC PANEL
ANION GAP: 13 (ref 5–15)
BUN: 11 mg/dL (ref 6–20)
CHLORIDE: 94 mmol/L — AB (ref 101–111)
CO2: 23 mmol/L (ref 22–32)
Calcium: 8.2 mg/dL — ABNORMAL LOW (ref 8.9–10.3)
Creatinine, Ser: 1 mg/dL (ref 0.61–1.24)
GFR calc Af Amer: >60 mL/min (ref >60)
GLUCOSE: 102 mg/dL — AB (ref 65–99)
POTASSIUM: 3 mmol/L — AB (ref 3.5–5.1)
Sodium: 130 mmol/L — ABNORMAL LOW (ref 135–145)

## 2015-12-15 MED ORDER — POTASSIUM CHLORIDE 10 MEQ/100ML IV SOLN
10.0000 meq | Freq: Once | INTRAVENOUS | Status: AC
  Administered 2015-12-15: 10 meq via INTRAVENOUS
  Filled 2015-12-15: qty 100

## 2015-12-15 MED ORDER — TORSEMIDE 20 MG PO TABS
10.0000 mg | ORAL_TABLET | Freq: Every day | ORAL | Status: DC
  Administered 2015-12-15: 10 mg via ORAL
  Filled 2015-12-15 (×2): qty 1

## 2015-12-15 MED ORDER — POTASSIUM CHLORIDE CRYS ER 20 MEQ PO TBCR
40.0000 meq | EXTENDED_RELEASE_TABLET | ORAL | Status: AC
  Administered 2015-12-15 (×2): 40 meq via ORAL
  Filled 2015-12-15 (×2): qty 2

## 2015-12-15 NOTE — Progress Notes (Signed)
Patient states he does not want RT to bring CPAP because it is to loud and he is unable to wear it. Patient doers not have home unit with him. RT informed patient if he changes his mind have RN contact RT.

## 2015-12-15 NOTE — Evaluation (Signed)
Physical Therapy Evaluation Patient Details Name: [name]. MRN: [id number] DOB: 08-06-1930 Today's Date: 12/15/2015   History of Present Illness  [name]. is a 82 y.o. male with a Past Medical History of asthma, CAD s/p CABG, COPD, CKD, depression, HLD, GERD/esophageal stricture, PAF who presents with HCAP, chronic hyponatremia.  Clinical Impression  Patient presents at his functional baseline.  Currently no acute skilled PT needs, but patient was undergoing outpatient PT to work on balance.  Will need MD to clear for returning to PT upon d/c.     Follow Up Recommendations Outpatient PT (to resume outpatient PT [facility] for balance)    Equipment Recommendations  None recommended by PT    Recommendations for Other Services       Precautions / Restrictions Precautions Precautions: None Precaution Comments: reports no falls, but is undergoing some outpatient PT to work on his balance      Mobility  Bed Mobility Overal bed mobility: Modified Independent                Transfers Overall transfer level: Modified independent                  Ambulation/Gait Ambulation/Gait assistance: Supervision Ambulation Distance (Feet): 350 Feet Assistive device: None Gait Pattern/deviations: Step-through pattern;Decreased stride length;Trunk flexed;Drifts right/left;Shuffle     General Gait Details: some imbalance, but patient reports due to not wearing his shoes; one cross step to catch balance, no physical assist given  Stairs            Wheelchair Mobility    Modified Rankin (Stroke Patients Only)       Balance Overall balance assessment: Modified Independent (uses safe compensatory techiques for balance standing beside sink for washing up)                                           Pertinent Vitals/Pain Pain Assessment: No/denies pain (but does report pain L groin after walking for 45 minutes (due to hernia))     Home Living Family/patient expects to be discharged to:: Private residence Living Arrangements: Spouse/significant other Available Help at Discharge: Family Type of Home: House Home Access: Stairs to enter Entrance Stairs-Rails: Right Entrance Stairs-Number of Steps: 2 Home Layout: One level Home Equipment: Cane - single point;Walker - 2 wheels      Prior Function Level of Independence: Independent         Comments: occasionally uses cane     Hand Dominance   Dominant Hand: Right    Extremity/Trunk Assessment   Upper Extremity Assessment: RUE deficits/detail;LUE deficits/detail RUE Deficits / Details: AROM WFL, except shoulder flexion limited to about 95; strength WFL     LUE Deficits / Details: AROM WFL, except shoulder flexion limited to about 95; strength WFL   Lower Extremity Assessment: Overall WFL for tasks assessed      Cervical / Trunk Assessment: Kyphotic  Communication   Communication: HOH  Cognition Arousal/Alertness: Awake/alert Behavior During Therapy: WFL for tasks assessed/performed Overall Cognitive Status: Within Functional Limits for tasks assessed                      General Comments General comments (skin integrity, edema, etc.): Reports he does all driving and IADL's due to wife with chronic degenerative disease    Exercises  Assessment/Plan    PT Assessment Patent does not need any further PT services  PT Diagnosis Abnormality of gait   PT Problem List    PT Treatment Interventions     PT Goals (Current goals can be found in the Care Plan section) Acute Rehab PT Goals PT Goal Formulation: All assessment and education complete, DC therapy    Frequency     Barriers to discharge        Co-evaluation               End of Session   Activity Tolerance: Patient tolerated treatment well Patient left: in chair;with call bell/phone within reach      Functional Assessment Tool Used: Clinical  Judgement Functional Limitation: Mobility: Walking and moving around Mobility: Walking and Moving Around Current Status [id number]): At least 1 percent but less than 20 percent impaired, limited or restricted Mobility: Walking and Moving Around Goal Status [phone]): At least 1 percent but less than 20 percent impaired, limited or restricted Mobility: Walking and Moving Around Discharge Status [phone]): At least 1 percent but less than 20 percent impaired, limited or restricted    Time: 1025-1055 PT Time Calculation (min) (ACUTE ONLY): 30 min   Charges:   PT Evaluation $PT Eval Moderate Complexity: 1 Procedure PT Treatments $Gait Training: 8-22 mins   PT G Codes:   PT G-Codes **NOT FOR INPATIENT CLASS** Functional Assessment Tool Used: Clinical Judgement Functional Limitation: Mobility: Walking and moving around Mobility: Walking and Moving Around Current Status [id number]): At least 1 percent but less than 20 percent impaired, limited or restricted Mobility: Walking and Moving Around Goal Status [phone]): At least 1 percent but less than 20 percent impaired, limited or restricted Mobility: Walking and Moving Around Discharge Status [phone]): At least 1 percent but less than 20 percent impaired, limited or restricted    Reginia Naas 12/15/2015, 11:25 AM  Magda Kiel, [place] 12/15/2015

## 2015-12-15 NOTE — Progress Notes (Signed)
[facility]  PROGRESS NOTE  [name]. [id number] DOB: 01/23/1931 DOA: 12/13/2015 PCP: Binnie Rail, MD    Brief HPI:   82 y/o ? CABG 2008 + stent 2012 as well as 2016 PCI to Subclavian for stenosis [id number] Chr Diastolic HF Chr Hyponatremia Labile BP Acute CVA 05/2015 admission OSA on cpap P Afib chad2vasc2 =5/ Eliquis CKD stg II Prior esophageal strciture H/o ETOH PVD COPD  Last Hospitalized 7/26-->7/30 Also admitted by cardiology for 7/9-->7/13 with symptomatic bradycardia  Re-admitted 12/14/15 for HCAP and chr Hyponatremia    Assessment/Plan:    1. Healthcare associated pneumonia- improving with vancomycin and cefepime, blood cultures 2 are negative so far. 2. Hyponatremia- improve the sodium is 130, likely combination of congestive heart failure and diarrhea. 3. COPD- stable, continue albuterol when necessary 4. Atrial fibrillation-  CHA2DS2VASc score is 5, continue Apixaban. 5. Hypertension- continue irbesartan 300 milligrams daily 6. CAD, status post CABG 2008/stents 2012/2016- stable, continue Imdur 60 mg twice a day, bisoprolol 20 mg by mouth daily. Continue Plavix 7. History of CVA-continue Eliquis, Plavix. 8. Chronic diastolic heart failure- stable, restart torsemide 10 mg by mouth daily 9. Chronic diarrhea- unclear cause as patient has been having these episodes intermittently at home. Continue Questran 8 g daily, has been seen in the past by GI Dr. Hendricks Milo flex sig 7/27 which did not reveal anything on pathology-For ROV 12/20/15 Dr Raquel James,  10. Left  inguinal hernia- patient to follow-up with surgery as outpatient   DVT prophylaxis: apixaban Code Status: Full code Family Communication: Called patient's daughter and left a message  Disposition Plan: Likely home in 1-2 days   Consultants:  None  Procedures:  None  Antibiotics:  Vancomycin  Cefepime    Subjective   Patient seen and examined, breathing has improved. Does  complain of intermittent diarrhea at home. Denies bowel movements in the hospital  Objective    Objective: Vitals:   12/14/15 2122 12/14/15 2127 12/15/15 0509 12/15/15 0905  BP: (!) 169/51  (!) 163/53 (!) 157/54  Pulse: 90  90 87  Resp: 17  17 16   Temp: 98.6 F (37 C)  98.7 F (37.1 C) 98.4 F (36.9 C)  TempSrc: Oral  Oral Oral  SpO2: 97% 95% 95% 97%  Weight: 69.2 kg (152 lb 8.9 oz)     Height:        Intake/Output Summary (Last 24 hours) at 12/15/15 1435 Last data filed at 12/15/15 0906  Gross per 24 hour  Intake              750 ml  Output             1825 ml  Net            -1075 ml   Filed Weights   12/13/15 1104 12/13/15 2001 12/14/15 2122  Weight: 66.2 kg (146 lb) 68.6 kg (151 lb 3.8 oz) 69.2 kg (152 lb 8.9 oz)    Examination:  General exam: Appears calm and comfortable  Respiratory system: Clear to auscultation. Respiratory effort normal. Cardiovascular system: S1 & S2 heard, RRR. No JVD, murmurs, rubs, gallops or clicks. No pedal edema. Gastrointestinal system: Abdomen is nondistended, soft and nontender. No organomegaly or masses felt. Normal bowel sounds heard. Central nervous system: Alert and oriented. No focal neurological deficits. Extremities: Symmetric 5 x 5 power. Skin: No rashes, lesions or ulcers Psychiatry: Judgement and insight appear normal. Mood & affect appropriate.    Data Reviewed: I have personally  reviewed following labs and imaging studies Basic Metabolic Panel:  Recent Labs Lab 12/13/15 1059 12/14/15 0459 12/15/15 0412  NA 129* 132* 130*  K 3.6 3.3* 3.0*  CL 98* 101 94*  CO2 24 23 23   GLUCOSE 100* 109* 102*  BUN 11 11 11   CREATININE 0.92 1.04 1.00  CALCIUM 8.4* 8.5* 8.2*   Liver Function Tests:  Recent Labs Lab 12/13/15 1059  AST 17  ALT 12*  ALKPHOS 41  BILITOT 0.9  PROT 5.5*  ALBUMIN 3.5   No results for input(s): LIPASE, AMYLASE in the last 168 hours. No results for input(s): AMMONIA in the last 168  hours. CBC:  Recent Labs Lab 12/13/15 1059 12/14/15 0459 12/15/15 0412  WBC 10.3 12.3* 7.9  NEUTROABS 8.8*  --  5.8  HGB 11.7* 11.0* 10.0*  HCT 34.5* 33.4* 30.9*  MCV 89.6 91.5 92.2  PLT 153 158 161   Cardiac Enzymes:  Recent Labs Lab 12/13/15 1059  TROPONINI <0.03   BNP (last 3 results)  Recent Labs  10/17/15 1740 11/03/15 2253 12/13/15 1059  BNP 150.2* 245.7* 248.6*    ProBNP (last 3 results) No results for input(s): PROBNP in the last 8760 hours.  CBG: No results for input(s): GLUCAP in the last 168 hours.  Recent Results (from the past 240 hour(s))  Blood Culture (routine x 2)     Status: None (Preliminary result)   Collection Time: 12/13/15 11:38 AM  Result Value Ref Range Status   Specimen Description BLOOD RIGHT ANTECUBITAL  Final   Special Requests BOTTLES DRAWN AEROBIC AND ANAEROBIC 5CC  Final   Culture NO GROWTH 1 DAY  Final   Report Status PENDING  Incomplete  Blood Culture (routine x 2)     Status: None (Preliminary result)   Collection Time: 12/13/15 11:48 AM  Result Value Ref Range Status   Specimen Description BLOOD RIGHT FOREARM  Final   Special Requests BOTTLES DRAWN AEROBIC AND ANAEROBIC 5CC  Final   Culture NO GROWTH 1 DAY  Final   Report Status PENDING  Incomplete  Urine culture     Status: None   Collection Time: 12/13/15 12:55 PM  Result Value Ref Range Status   Specimen Description URINE, CLEAN CATCH  Final   Special Requests NONE  Final   Culture NO GROWTH  Final   Report Status 12/14/2015 FINAL  Final     Studies: No results found.  Scheduled Meds: . apixaban  5 mg Oral BID  . bisoprolol  20 mg Oral Daily  . ceFEPime (MAXIPIME) IV  2 g Intravenous Q24H  . cholestyramine  8 g Oral QPC lunch  . clopidogrel  75 mg Oral Q breakfast  . guaiFENesin  600 mg Oral BID  . irbesartan  300 mg Oral Daily  . isosorbide mononitrate  60 mg Oral BID  . latanoprost  1 drop Both Eyes QHS  . LORazepam  0.5 mg Oral BID  .  mometasone-formoterol  2 puff Inhalation BID  . montelukast  10 mg Oral QHS  . pantoprazole  40 mg Oral BID  . potassium chloride  40 mEq Oral Q4H  . pravastatin  80 mg Oral q morning - 10a  . spironolactone  12.5 mg Oral Daily  . torsemide  10 mg Oral Daily  . vancomycin  500 mg Intravenous Q12H   Continuous Infusions:      Time spent: 25 min    [facility] Pager [phone]. If 7PM-7AM, please contact night-coverage  at [email or web address], Office  [phone]  password [email or web address] 12/15/2015, 2:35 PM  LOS: 0 days

## 2015-12-16 DIAGNOSIS — J189 Pneumonia, unspecified organism: Secondary | ICD-10-CM | POA: Diagnosis not present

## 2015-12-16 DIAGNOSIS — N182 Chronic kidney disease, stage 2 (mild): Secondary | ICD-10-CM | POA: Diagnosis not present

## 2015-12-16 DIAGNOSIS — K529 Noninfective gastroenteritis and colitis, unspecified: Secondary | ICD-10-CM | POA: Diagnosis not present

## 2015-12-16 DIAGNOSIS — I5032 Chronic diastolic (congestive) heart failure: Secondary | ICD-10-CM | POA: Diagnosis not present

## 2015-12-16 LAB — BASIC METABOLIC PANEL
ANION GAP: 9 (ref 5–15)
BUN: 15 mg/dL (ref 6–20)
CHLORIDE: 103 mmol/L (ref 101–111)
CO2: 24 mmol/L (ref 22–32)
Calcium: 8.9 mg/dL (ref 8.9–10.3)
Creatinine, Ser: 0.95 mg/dL (ref 0.61–1.24)
GFR calc Af Amer: >60 mL/min (ref >60)
GLUCOSE: 104 mg/dL — AB (ref 65–99)
POTASSIUM: 3.7 mmol/L (ref 3.5–5.1)
Sodium: 136 mmol/L (ref 135–145)

## 2015-12-16 MED ORDER — LOPERAMIDE HCL 2 MG PO TABS: 2.0000 mg | ORAL_TABLET | Freq: Four times a day (QID) | ORAL | 1 refills | Status: DC | PRN

## 2015-12-16 MED ORDER — AZITHROMYCIN 500 MG PO TABS: 500.0000 mg | ORAL_TABLET | Freq: Every day | ORAL | 0 refills | Status: DC

## 2015-12-16 MED ORDER — CHOLESTYRAMINE 4 G PO PACK: 8.0000 g | PACK | Freq: Every day | ORAL | 12 refills | Status: DC

## 2015-12-16 NOTE — Discharge Instructions (Signed)

## 2015-12-16 NOTE — Progress Notes (Signed)
HPI The patient presents for follow up of  CAD, s/p CABG, s/p Promus DES to S-PDA in 06/2010.  He was hospitalized last year with hypertensive urgency and NSTEMI.  He had subtotal stenosis of the vein graft to posterior lateral off the right coronary. He had stenting of the native right coronary.  The patient also was found to have subclavian stenosis.   He was later admitted for PCI of the subclavian.  He did have medication changes to include the addition of spironolactone and increased metoprolol.  He had an ileus postprocedure as well.  He has been hospitalized 5 x this year.  I have reviewed the 4 admissions since I last saw him.  He was in the in the hosp in June with HTN and had carvedilol stopped secondary to wheezing.  Clonidine was stopped secondary to BP swings.  He was started on Dilt, bisoprolol and spironolactone.  He had bradycardia in July and had Dilt stopped.  Later in July he had hyponatremia thought to be related to diarrhea.   He was hospitalized again this week with pneumonia.  He just got out of the hosp yesterday.   He was treated with a Z pac.  He still having his cough. He thinks he had a little blood-tinged sputum. He's not describing any PND or orthopnea. His weights have been steady.  He's not had any wild fluctuations in his heart rate or blood pressure. They seem to be relatively well controlled since his recent med changes.    Allergies  Allergen Reactions  . Ace Inhibitors Other (See Comments)    Severe asthma (COPD)  . Avelox [Moxifloxacin Hcl In Nacl] Other (See Comments)    Gum pain  . Tape Other (See Comments)    Adhesive and tape reaction-tears skin off   . Lidocaine Other (See Comments)    Possible reaction? Dizziness, confusion, syncope  . Morphine And Related Nausea And Vomiting  . Codeine Nausea And Vomiting  . Lasix [Furosemide] Itching and Swelling  . Other Other (See Comments)    Maple trees, allergy symptoms    Current Outpatient Prescriptions    Medication Sig Dispense Refill  . acetaminophen (TYLENOL) 325 MG tablet Take 650 mg by mouth every 6 (six) hours as needed for moderate pain or headache.     . albuterol (PROAIR HFA) 108 (90 Base) MCG/ACT inhaler Inhale 2 puffs into the lungs every 6 (six) hours as needed for wheezing or shortness of breath. 1 Inhaler 12  . apixaban (ELIQUIS) 5 MG TABS tablet Take 1 tablet (5 mg total) by mouth 2 (two) times daily. 60 tablet 11  . azithromycin (ZITHROMAX) 500 MG tablet Take 1 tablet (500 mg total) by mouth daily. 5 tablet 0  . bisoprolol (ZEBETA) 10 MG tablet Take 2 tablets (20 mg total) by mouth daily. 60 tablet 5  . calcium-vitamin D (OSCAL WITH D) 500-200 MG-UNIT per tablet Take 1 tablet by mouth 2 (two) times daily.    . cholestyramine (QUESTRAN) 4 g packet Take 2 packets (8 g total) by mouth daily after lunch. 60 each 12  . clopidogrel (PLAVIX) 75 MG tablet Take 1 tablet (75 mg total) by mouth daily with breakfast. 30 tablet 8  . dexlansoprazole (DEXILANT) 60 MG capsule Take 1 capsule (60 mg total) by mouth daily. 30 capsule 3  . ipratropium-albuterol (DUONEB) 0.5-2.5 (3) MG/3ML SOLN USE ONE VIAL VIA NEBULIZER EVERY 6 HOURS AS NEEDED FOR SHORTNESS OF BREATH 360 mL 5  . isosorbide  mononitrate (IMDUR) 60 MG 24 hr tablet Take 1 tablet (60 mg total) by mouth 2 (two) times daily. 180 tablet 0  . latanoprost (XALATAN) 0.005 % ophthalmic solution Place 1 drop into both eyes at bedtime.     Marland Kitchen loperamide (IMODIUM A-D) 2 MG tablet Take 1 tablet (2 mg total) by mouth 4 (four) times daily as needed for diarrhea or loose stools. 30 tablet 1  . LORazepam (ATIVAN) 0.5 MG tablet Take 0.5 mg by mouth 2 (two) times daily.    . mometasone-formoterol (DULERA) 100-5 MCG/ACT AERO Inhale 2 puffs into the lungs 2 (two) times daily. Rinse mouth after use    . montelukast (SINGULAIR) 10 MG tablet take 1 tablet by mouth at bedtime 90 tablet 3  . nitroGLYCERIN (NITROSTAT) 0.4 MG SL tablet Place 1 tablet (0.4 mg total)  under the tongue every 5 (five) minutes as needed for chest pain. 25 tablet 0  . pravastatin (PRAVACHOL) 80 MG tablet Take 1 tablet (80 mg total) by mouth every morning. 90 tablet 3  . PRESCRIPTION MEDICATION Inhale into the lungs at bedtime. CPAP/ 12    . PRESCRIPTION MEDICATION Inject 1 each into the skin once a week. Allergy shots from Dr. Annamaria Boots (1:10) - self-administered - usually Thursday or Friday    . Probiotic Product (PROBIOTIC DAILY) CAPS Take 1 capsule by mouth 2 (two) times daily.     Marland Kitchen RA VITAMIN B-12 TR 1000 MCG TBCR take 1 tablet by mouth once daily 30 tablet 11  . ranitidine (ZANTAC) 150 MG tablet Take 1 tablet (150 mg total) by mouth 2 (two) times daily. 60 tablet 3  . spironolactone (ALDACTONE) 25 MG tablet Take 0.5 tablets (12.5 mg total) by mouth daily. 30 tablet 2  . torsemide (DEMADEX) 20 MG tablet Take 0.5 tablets (10 mg total) by mouth daily. 30 tablet 2  . traMADol (ULTRAM) 50 MG tablet Take 1 tablet (50 mg total) by mouth every 8 (eight) hours as needed. 21 tablet 0  . traZODone (DESYREL) 50 MG tablet Take 1 tablet (50 mg total) by mouth at bedtime as needed for sleep. 30 tablet 5  . valsartan (DIOVAN) 320 MG tablet Take 1 tablet (320 mg total) by mouth daily. 90 tablet 3   No current facility-administered medications for this visit.     Past Medical History:  Diagnosis Date  . Arthritis   . ASTHMA   . Benign liver cyst   . CAD (coronary artery disease)    a. s/p CABG;  b. cath 06/27/10: S-Dx occluded, S-PDA 80-90% (tx with PCI); S-OM ok, L-LAD ok;  EF 65-70%  c.  s/p Promus DES to S-PDA 06/2010;   d. Cath 8 2016 LIMA to the LAD patent, SVG to posterior lateral subtotal, SVG to diagonal occluded chronically. The patient had stenting of the native right vessel.  . Cataract    surgery to both eyes  . Chronic diastolic CHF (congestive heart failure), NYHA class 2 ([place])   . CKD (chronic kidney disease), stage II   . COPD (chronic obstructive pulmonary disease) ([place])     . Depression with anxiety   . Diverticulosis   . DYSLIPIDEMIA   . Esophageal stricture   . Essential hypertension    Echo 3/12: EF 55-60%; mod LVH; mild AS/AI; LAE; PASP 38; mild pulmo HTN  . GERD    with HH, hx esophageal stricture  . Glaucoma   . Hiatal hernia   . Osteoporosis   . PAF (paroxysmal atrial  fibrillation) ([place])   . Personal history of alcoholism ([place])    quit etoh and abstinent since age 9.   Marland Kitchen Pneumonia 04/2013 hosp  . Skin cancer    L forearm  . SLEEP APNEA 09/2001   NPSG AHI 22/HR  . Subclavian arterial stenosis ([facility])    a. s/p PTCA of subclavian 2016.    Past Surgical History:  Procedure Laterality Date  . CARDIAC CATHETERIZATION N/A 11/26/2014   Procedure: Left Heart Cath and Cors/Grafts Angiography;  Surgeon: Sherren Mocha, MD;  Location: [facility];  Service: Cardiovascular;  Laterality: N/A;  . CARDIAC CATHETERIZATION N/A 11/26/2014   Procedure: Coronary Stent Intervention;  Surgeon: Sherren Mocha, MD;  Location: [facility];  Service: Cardiovascular;  Laterality: N/A;  . CATARACT EXTRACTION, BILATERAL  2007  . CHOLECYSTECTOMY  02/21/2011   Procedure: LAPAROSCOPIC CHOLECYSTECTOMY WITH INTRAOPERATIVE CHOLANGIOGRAM;  Surgeon: Pedro Earls, MD;  Location: [facility];  Service: General;  Laterality: N/A;  . CORONARY ANGIOPLASTY WITH STENT PLACEMENT  07/2010  . CORONARY ARTERY BYPASS GRAFT  02/2007  . FLEXIBLE SIGMOIDOSCOPY N/A 11/05/2015   Procedure: FLEXIBLE SIGMOIDOSCOPY;  Surgeon: Jerene Bears, MD;  Location: [facility];  Service: Endoscopy;  Laterality: N/A;  . HERNIA REPAIR  unsure ?60's  . PERIPHERAL VASCULAR CATHETERIZATION N/A 02/22/2015   Procedure: Upper Extremity Angiography;  Surgeon: Lorretta Harp, MD;  Location: [facility];  Service: Cardiovascular;  Laterality: N/A;  . TONSILLECTOMY      ROS:    As stated in the HPI and negative for all other systems.  PHYSICAL EXAM BP (!) 144/66   Pulse 71   Ht 5\' 2"  (1.575 m)    Wt 146 lb 12.8 oz (66.6 kg)   SpO2 96%   BMI 26.85 kg/m  GENERAL:  Chronically ill appearing but in no acute distress HEENT:  Pupils equal round and reactive, fundi not visualized, oral mucosa unremarkable, dentures NECK:  No jugular venous distention, waveform within normal limits, carotid upstroke brisk and symmetric, left carotid bruits, no thyromegaly LUNGS:  Decreased breath sounds at the right base CHEST:  Well healed sternotomy scar. HEART:  PMI not displaced or sustained,S1 and S2 within normal limits, no S3, no S4, no clicks, no rubs, soft apical, right upper sterna border early peaking murmur ABD:  Flat, positive bowel sounds normal in frequency in pitch, no bruits, no rebound, no guarding, no midline pulsatile mass, no hepatomegaly, no splenomegaly, healed abdominal scars. EXT:  2 plus pulses throughout, no edema, no cyanosis no clubbing, right bruit.   SKIN:  No nodules, bruising  ASSESSMENT AND PLAN  CAD:  The patient has no new sypmtoms.  No change in therapy is indicated.  ATRIAL FIBRILLATION:    He has no paroxysmal symptoms.  He will continue with Eliqus as above  HTN: His blood pressure seems to be better controlled and he is not excessively taking like he was before.  CAROTID STENOSIS:  I reviewed the results from last year he had 40-59% bilateral stenosis.  He will have follow-up in September.  AS:  This was moderate on echo earlier this year. We will follow this clinically.  DYSPNEA:  I will get him in to see pulm next week for follow up.    EXTENSIVE REVIEW OF MULTIPLE HOSPITALIZATIONS.

## 2015-12-16 NOTE — Consult Note (Signed)
   [facility] CM Inpatient Consult   12/16/2015  Stephen Buzzetta Kreger Jr. [birth year] [id number]   Referral received from inpatient [facility] for [facility] services for medication management and multiple hospital admissions. [name] lives with his wife who fills his pill boxes. Questionable whether medications are being taken correctly. Inpatient RNCM indicates [facility] will be set up for RN for medication management as well. Spoke with [name] at bedside prior to discharge. He is agreeable to re-engaging with [place] [facility] services. He was recently discharged from the [facility] program because further community case management needs were denied by wife, Mrs. [name]. At bedside, [name] endorses that he has a "Biochemist, clinical". Made him aware that he will receive post hospital discharge calls and will be evaluated for monthly home visits. Explained that [facility] will not interfere or replace services provided by home health.  Made inpatient RNCM aware that [facility] will follow post hospital discharge.   Marthenia Rolling, MSN-Ed, RN,BSN [facility] Liaison [phone]

## 2015-12-16 NOTE — Discharge Summary (Signed)
Physician Discharge Summary  [name]. [id number] DOB: November 20, 1930 DOA: 12/13/2015  PCP: Binnie Rail, MD  Admit date: 12/13/2015 Discharge date: 12/16/2015  Time spent: 25* minutes  Recommendations for Outpatient Follow-up:  1. Follow up PCP in 2 weeks   Discharge Diagnoses:  Active Problems:   Asthma with COPD ([place])   GERD   Obstructive sleep apnea   Chronic diastolic heart failure (HCC)   Cough   Hyponatremia   CKD (chronic kidney disease), stage II   Chronic diarrhea   HCAP (healthcare-associated pneumonia)   Discharge Condition: Stable  Diet recommendation: Regular diet  Filed Weights   12/13/15 1104 12/13/15 2001 12/14/15 2122  Weight: 66.2 kg (146 lb) 68.6 kg (151 lb 3.8 oz) 69.2 kg (152 lb 8.9 oz)    History of present illness:   82 y.o. male with multiple medical problems not limited to coronary artery disease status post remote CABG and stent placement in 0000000, chronic diastolic heart failure, PAF, COPD. Hospitalized a few times over the summer for hyponatremia, diarrhea, UTI. Patient was last discharged late July. Since discharge patient has been weaker than normal per wife . Patient does not like to sit around but rather get out and about. Wife feels his gait is unsteady. Patient has had a cough for nearly 3 months. Cough productive of white sputum.  Yesterday evening the patient began getting short of breath at rest . No chest pain but he does complain of pain in his right mid to upper back .   Patient's diarrhea persists. He has 3-4 loose bowel movements a day  Hospital Course:  1. Healthcare associated pneumonia- improved  with vancomycin and cefepime, blood cultures 2 are negative so far. Will discharge on Zithromax 500 mg po daily x 5 days. 2. Hyponatremia- improved, the sodium is 136, likely combination of congestive heart failure and diarrhea.Patient instructed to drink about 2 lietrs of fluid everyday, and half of it should include gatorade or  electrolyte mixed with water. I discussed with nephrology, patient has hyponatremia due to ongoing diarrhea. Should control chronic diarrhea to correct sodium. 3. COPD- stable, continue albuterol when necessary 4. Atrial fibrillation-  CHA2DS2VASc score is 5, continue Apixaban. 5. Hypertension- continue irbesartan 300 milligrams daily 6. CAD, status post CABG 2008/stents 2012/2016- stable, continue Imdur 60 mg twice a day, bisoprolol 20 mg by mouth daily. Continue Plavix 7. History of CVA-continue Eliquis, Plavix. 8. Chronic diastolic heart failure- stable, restart torsemide 10 mg by mouth daily 9. Chronic diarrhea- unclear cause as patient has been having these episodes intermittently at home. Continue Questran 8 g daily, has been seen in the past by GI Dr. Hendricks Milo flex sig 7/27 which did not reveal anything on pathology-For ROV 12/20/15 Dr Raquel James, Also started on Imodium 2 mg po q 6 hr prn.  10. Left  inguinal hernia- patient to follow-up with surgery as outpatient  Procedures:  None   Consultations:  None  Discharge Exam: Vitals:   12/16/15 0817 12/16/15 0947  BP: (!) 202/86 (!) 185/70  Pulse: 83 75  Resp: 18   Temp: 97.7 F (36.5 C)     General: Appears in no acute distress Cardiovascular: S1S2 RRR Respiratory: Clear bilaterally  Discharge Instructions   Discharge Instructions    Diet - low sodium heart healthy    Complete by:  As directed   Discharge instructions    Complete by:  As directed   Drink about 2 liters of fluid, 70 Oz everyday.  Half  of the fluid can be Gatorade or water mixed with Electrolyte powder.   Increase activity slowly    Complete by:  As directed     Current Discharge Medication List    START taking these medications   Details  azithromycin (ZITHROMAX) 500 MG tablet Take 1 tablet (500 mg total) by mouth daily. Qty: 5 tablet, Refills: 0    loperamide (IMODIUM A-D) 2 MG tablet Take 1 tablet (2 mg total) by mouth 4 (four) times daily as  needed for diarrhea or loose stools. Qty: 30 tablet, Refills: 1      CONTINUE these medications which have CHANGED   Details  cholestyramine (QUESTRAN) 4 g packet Take 2 packets (8 g total) by mouth daily after lunch. Qty: 60 each, Refills: 12      CONTINUE these medications which have NOT CHANGED   Details  acetaminophen (TYLENOL) 325 MG tablet Take 650 mg by mouth every 6 (six) hours as needed for moderate pain or headache.     albuterol (PROAIR HFA) 108 (90 Base) MCG/ACT inhaler Inhale 2 puffs into the lungs every 6 (six) hours as needed for wheezing or shortness of breath. Qty: 1 Inhaler, Refills: 12    apixaban (ELIQUIS) 5 MG TABS tablet Take 1 tablet (5 mg total) by mouth 2 (two) times daily. Qty: 60 tablet, Refills: 11    bisoprolol (ZEBETA) 10 MG tablet Take 2 tablets (20 mg total) by mouth daily. Qty: 60 tablet, Refills: 5    calcium-vitamin D (OSCAL WITH D) 500-200 MG-UNIT per tablet Take 1 tablet by mouth 2 (two) times daily.    clopidogrel (PLAVIX) 75 MG tablet Take 1 tablet (75 mg total) by mouth daily with breakfast. Qty: 30 tablet, Refills: 8    dexlansoprazole (DEXILANT) 60 MG capsule Take 1 capsule (60 mg total) by mouth daily. Qty: 30 capsule, Refills: 3    ipratropium-albuterol (DUONEB) 0.5-2.5 (3) MG/3ML SOLN USE ONE VIAL VIA NEBULIZER EVERY 6 HOURS AS NEEDED FOR SHORTNESS OF BREATH Qty: 360 mL, Refills: 5    isosorbide mononitrate (IMDUR) 60 MG 24 hr tablet Take 1 tablet (60 mg total) by mouth 2 (two) times daily. Qty: 180 tablet, Refills: 0    latanoprost (XALATAN) 0.005 % ophthalmic solution Place 1 drop into both eyes at bedtime.     LORazepam (ATIVAN) 0.5 MG tablet Take 0.5 mg by mouth 2 (two) times daily.    mometasone-formoterol (DULERA) 100-5 MCG/ACT AERO Inhale 2 puffs into the lungs 2 (two) times daily. Rinse mouth after use    montelukast (SINGULAIR) 10 MG tablet take 1 tablet by mouth at bedtime Qty: 90 tablet, Refills: 3    nitroGLYCERIN  (NITROSTAT) 0.4 MG SL tablet Place 1 tablet (0.4 mg total) under the tongue every 5 (five) minutes as needed for chest pain. Qty: 25 tablet, Refills: 0    pravastatin (PRAVACHOL) 80 MG tablet Take 1 tablet (80 mg total) by mouth every morning. Qty: 90 tablet, Refills: 3    !! PRESCRIPTION MEDICATION Inhale into the lungs at bedtime. CPAP/ 12    !! PRESCRIPTION MEDICATION Inject 1 each into the skin once a week. Allergy shots from Dr. Annamaria Boots (1:10) - self-administered - usually Thursday or Friday    Probiotic Product (PROBIOTIC DAILY) CAPS Take 1 capsule by mouth 2 (two) times daily.     RA VITAMIN B-12 TR 1000 MCG TBCR take 1 tablet by mouth once daily Qty: 30 tablet, Refills: 11    ranitidine (ZANTAC) 150 MG tablet Take  1 tablet (150 mg total) by mouth 2 (two) times daily. Qty: 60 tablet, Refills: 3    spironolactone (ALDACTONE) 25 MG tablet Take 0.5 tablets (12.5 mg total) by mouth daily. Qty: 30 tablet, Refills: 2    torsemide (DEMADEX) 20 MG tablet Take 0.5 tablets (10 mg total) by mouth daily. Qty: 30 tablet, Refills: 2    traZODone (DESYREL) 50 MG tablet Take 1 tablet (50 mg total) by mouth at bedtime as needed for sleep. Qty: 30 tablet, Refills: 5    valsartan (DIOVAN) 320 MG tablet Take 1 tablet (320 mg total) by mouth daily. Qty: 90 tablet, Refills: 3    traMADol (ULTRAM) 50 MG tablet Take 1 tablet (50 mg total) by mouth every 8 (eight) hours as needed. Qty: 21 tablet, Refills: 0     !! - Potential duplicate medications found. Please discuss with provider.    STOP taking these medications     diphenoxylate-atropine (LOMOTIL) 2.5-0.025 MG tablet        Allergies  Allergen Reactions  . Ace Inhibitors Other (See Comments)    Severe asthma (COPD)  . Avelox [Moxifloxacin Hcl In Nacl] Other (See Comments)    Gum pain  . Tape Other (See Comments)    Adhesive and tape reaction-tears skin off   . Lidocaine Other (See Comments)    Possible reaction? Dizziness,  confusion, syncope  . Morphine And Related Nausea And Vomiting  . Codeine Nausea And Vomiting  . Lasix [Furosemide] Itching and Swelling  . Other Other (See Comments)    Maple trees, allergy symptoms      The results of significant diagnostics from this hospitalization (including imaging, microbiology, ancillary and laboratory) are listed below for reference.    Significant Diagnostic Studies: Dg Chest 2 View  Result Date: 12/13/2015 CLINICAL DATA:  82 year old male with a history of labored breathing EXAM: CHEST  2 VIEW COMPARISON:  11/04/2015, 10/17/2015 FINDINGS: Cardiomediastinal silhouette unchanged. Surgical changes of prior median sternotomy and CABG. Calcifications of the aortic arch. No evidence of central vascular congestion. Interval development of airspace disease of the right lower lobe of the right lung. No pneumothorax. No pleural effusion. Left lung relatively well aerated. No displaced fracture. Unchanged configuration of the thoracic vertebral bodies. IMPRESSION: Lobar pneumonia of the right lower lobe. Aortic atherosclerosis Surgical changes of median sternotomy and CABG. Signed, Dulcy Fanny. Earleen Newport, DO [facility] Radiology Electronically Signed   By: Corrie Mckusick D.O.   On: 12/13/2015 13:38    Microbiology: Recent Results (from the past 240 hour(s))  Blood Culture (routine x 2)     Status: None (Preliminary result)   Collection Time: 12/13/15 11:38 AM  Result Value Ref Range Status   Specimen Description BLOOD RIGHT ANTECUBITAL  Final   Special Requests BOTTLES DRAWN AEROBIC AND ANAEROBIC 5CC  Final   Culture NO GROWTH 2 DAYS  Final   Report Status PENDING  Incomplete  Blood Culture (routine x 2)     Status: None (Preliminary result)   Collection Time: 12/13/15 11:48 AM  Result Value Ref Range Status   Specimen Description BLOOD RIGHT FOREARM  Final   Special Requests BOTTLES DRAWN AEROBIC AND ANAEROBIC 5CC  Final    Culture NO GROWTH 2 DAYS  Final   Report Status PENDING  Incomplete  Urine culture     Status: None   Collection Time: 12/13/15 12:55 PM  Result Value Ref Range Status   Specimen Description URINE, CLEAN CATCH  Final  Special Requests NONE  Final   Culture NO GROWTH  Final   Report Status 12/14/2015 FINAL  Final     Labs: Basic Metabolic Panel:  Recent Labs Lab 12/13/15 1059 12/14/15 0459 12/15/15 0412 12/16/15 0338  NA 129* 132* 130* 136  K 3.6 3.3* 3.0* 3.7  CL 98* 101 94* 103  CO2 24 23 23 24   GLUCOSE 100* 109* 102* 104*  BUN 11 11 11 15   CREATININE 0.92 1.04 1.00 0.95  CALCIUM 8.4* 8.5* 8.2* 8.9   Liver Function Tests:  Recent Labs Lab 12/13/15 1059  AST 17  ALT 12*  ALKPHOS 41  BILITOT 0.9  PROT 5.5*  ALBUMIN 3.5   No results for input(s): LIPASE, AMYLASE in the last 168 hours. No results for input(s): AMMONIA in the last 168 hours. CBC:  Recent Labs Lab 12/13/15 1059 12/14/15 0459 12/15/15 0412  WBC 10.3 12.3* 7.9  NEUTROABS 8.8*  --  5.8  HGB 11.7* 11.0* 10.0*  HCT 34.5* 33.4* 30.9*  MCV 89.6 91.5 92.2  PLT 153 158 161   Cardiac Enzymes:  Recent Labs Lab 12/13/15 1059  TROPONINI <0.03   BNP: BNP (last 3 results)  Recent Labs  10/17/15 1740 11/03/15 2253 12/13/15 1059  BNP 150.2* 245.7* 248.6*    ProBNP (last 3 results) No results for input(s): PROBNP in the last 8760 hours.  CBG: No results for input(s): GLUCAP in the last 168 hours.     SignedOswald Hillock MD.  [facility] 12/16/2015, 10:56 AM

## 2015-12-16 NOTE — Care Management Note (Signed)
Case Management Note  Patient Details  Name: [name]. MRN: [id number] Date of Birth: Sep 05, 1930  Subjective/Objective:       CM following for progression and d/c planning.             Action/Plan: 12/16/2015 Met with pt re d/c needs, discussed HHRN for medication management and pt is agreeable, pt has also been active with [facility] in the past and wishes to restart these services.. [facility] has seen pt and made arrangement to continue to follow. Pt selected [facility] for [facility] services, [place] notified. No DME needed.   Expected Discharge Date:  12/17/15               Expected Discharge Plan:  [place]  In-House Referral:  NA  Discharge planning Services  CM Consult  Post Acute Care Choice:  Home Health Choice offered to:  Patient  DME Arranged:   NA DME Agency:   NA  HH Arranged:  RN [place] Agency:  [facility]  Status of Service:  Completed, signed off  If discussed at [employer] of Stay Meetings, dates discussed:    Additional Comments:  Adron Bene, RN 12/16/2015, 2:02 PM

## 2015-12-17 ENCOUNTER — Ambulatory Visit (INDEPENDENT_AMBULATORY_CARE_PROVIDER_SITE_OTHER): Payer: Medicare Other | Admitting: Cardiology

## 2015-12-17 ENCOUNTER — Telehealth: Payer: Self-pay | Admitting: Internal Medicine

## 2015-12-17 ENCOUNTER — Other Ambulatory Visit: Payer: Self-pay | Admitting: *Deleted

## 2015-12-17 ENCOUNTER — Encounter: Payer: Self-pay | Admitting: *Deleted

## 2015-12-17 ENCOUNTER — Encounter: Payer: Self-pay | Admitting: Cardiology

## 2015-12-17 ENCOUNTER — Telehealth: Payer: Self-pay | Admitting: *Deleted

## 2015-12-17 VITALS — BP 144/66 | HR 71 | Ht 62.0 in | Wt 146.8 lb

## 2015-12-17 DIAGNOSIS — R059 Cough, unspecified: Secondary | ICD-10-CM

## 2015-12-17 DIAGNOSIS — R0989 Other specified symptoms and signs involving the circulatory and respiratory systems: Secondary | ICD-10-CM | POA: Diagnosis not present

## 2015-12-17 DIAGNOSIS — R05 Cough: Secondary | ICD-10-CM

## 2015-12-17 DIAGNOSIS — I25700 Atherosclerosis of coronary artery bypass graft(s), unspecified, with unstable angina pectoris: Secondary | ICD-10-CM | POA: Diagnosis not present

## 2015-12-17 NOTE — Patient Instructions (Addendum)
Medication Instructions:  Continue current medications  Labwork: None Ordered  Testing/Procedures: Your physician has requested that you have a carotid duplex. This test is an ultrasound of the carotid arteries in your neck. It looks at blood flow through these arteries that supply the brain with blood. Allow one hour for this exam. There are no restrictions or special instructions.  Follow-Up: You have been referred to Pulmonologist  Your physician recommends that you schedule a follow-up appointment in: 2 Months with PA  Any Other Special Instructions Will Be Listed Below (If Applicable).   If you need a refill on your cardiac medications before your next appointment, please call your pharmacy.

## 2015-12-17 NOTE — Patient Outreach (Signed)
[facility]) Care Management  12/17/2015  Darrlyn Trosclair. 29-May-1930 [id number]   RN spoke with pt's primary caregiver Rod Holler) his spouse due to pt's is [facility] however pt has permitted RN to speak on his behalf. RN reintroduced [facility] due to recently been discharged last week. Discussed once again the [facility] services and purpose of this call. Wife reports pt is doing better. Reports pt's COPD is "good" and his HF is "good" with weights "sticking close to 142-144 lbs" and he continues to wear his compression stockings everyday with no problems or swelling related. Wife also reports pt continues to use his CPAP at night with no reported breathing problems.   Discussed pt's current issue related to his admission for pneumonia wife reports pt is taking all his medications as prescribed with no problems. RN inquired about medication management however wife reports she is managing the pt's medications with no problems and does not need assistance with this matter at this time. States [facility] will be involved however delayed as the agency has attempted several times to arrange a home visit but pt has several appointments. Therefore this appointment is pending however pt has the contact number for the agency if an urgent need arises. RN offered community home visits to engage further however at this time caregiver declined and agrees to ongoing transition of care and a follow up call from next week. Will scheduled and follow up accordingly with ongoing transition of care calls and provide needed resources as needed. No needed today upon inquiring. Based upon the above information will generate and discuss a plan of care and goals and re-evaluate accordingly.   Patient was recently discharged from hospital and all medications have been reviewed.  Raina Mina, RN Care Management Coordinator [place] Office [phone]

## 2015-12-17 NOTE — Telephone Encounter (Signed)
Transition Care Management Follow-up Telephone Call   Date discharged? 12/16/15   How have you been since you were released from the hospital? Spoke w/pt wife she states he is doing alright   Do you understand why you were in the hospital? YES   Do you understand the discharge instructions? YES   Where were you discharged to? Home   Items Reviewed:  Medications reviewed: YES  Allergies reviewed: YES  Dietary changes reviewed: NO  Referrals reviewed: No referral needed   Functional Questionnaire:   Activities of Daily Living (ADLs):   She states he are independent in the following: ambulation, bathing and hygiene, feeding, continence, grooming, toileting and dressing States he require assistance with the following: ambulation sometimes   Any transportation issues/concerns?: NO   Any patient concerns? NO   Confirmed importance and date/time of follow-up visits scheduled YES, appt made 12/28/15  Provider Appointment booked with Dr. Quay Burow  Confirmed with patient if condition begins to worsen call PCP or go to the ER.  Patient was given the office number and encouraged to call back with question or concerns.  : YES

## 2015-12-18 DIAGNOSIS — I2510 Atherosclerotic heart disease of native coronary artery without angina pectoris: Secondary | ICD-10-CM | POA: Diagnosis not present

## 2015-12-18 DIAGNOSIS — J45909 Unspecified asthma, uncomplicated: Secondary | ICD-10-CM | POA: Diagnosis not present

## 2015-12-18 DIAGNOSIS — I739 Peripheral vascular disease, unspecified: Secondary | ICD-10-CM | POA: Diagnosis not present

## 2015-12-18 DIAGNOSIS — K219 Gastro-esophageal reflux disease without esophagitis: Secondary | ICD-10-CM | POA: Diagnosis not present

## 2015-12-18 DIAGNOSIS — Z7901 Long term (current) use of anticoagulants: Secondary | ICD-10-CM | POA: Diagnosis not present

## 2015-12-18 DIAGNOSIS — Z951 Presence of aortocoronary bypass graft: Secondary | ICD-10-CM | POA: Diagnosis not present

## 2015-12-18 DIAGNOSIS — Z8719 Personal history of other diseases of the digestive system: Secondary | ICD-10-CM | POA: Diagnosis not present

## 2015-12-18 DIAGNOSIS — I5032 Chronic diastolic (congestive) heart failure: Secondary | ICD-10-CM | POA: Diagnosis not present

## 2015-12-18 DIAGNOSIS — G4733 Obstructive sleep apnea (adult) (pediatric): Secondary | ICD-10-CM | POA: Diagnosis not present

## 2015-12-18 DIAGNOSIS — E871 Hypo-osmolality and hyponatremia: Secondary | ICD-10-CM | POA: Diagnosis not present

## 2015-12-18 DIAGNOSIS — I130 Hypertensive heart and chronic kidney disease with heart failure and stage 1 through stage 4 chronic kidney disease, or unspecified chronic kidney disease: Secondary | ICD-10-CM | POA: Diagnosis not present

## 2015-12-18 DIAGNOSIS — J449 Chronic obstructive pulmonary disease, unspecified: Secondary | ICD-10-CM | POA: Diagnosis not present

## 2015-12-18 DIAGNOSIS — N182 Chronic kidney disease, stage 2 (mild): Secondary | ICD-10-CM | POA: Diagnosis not present

## 2015-12-18 DIAGNOSIS — I4891 Unspecified atrial fibrillation: Secondary | ICD-10-CM | POA: Diagnosis not present

## 2015-12-18 LAB — CULTURE, BLOOD (ROUTINE X 2)
CULTURE: NO GROWTH
Culture: NO GROWTH

## 2015-12-20 ENCOUNTER — Ambulatory Visit: Payer: Self-pay | Admitting: Surgery

## 2015-12-20 ENCOUNTER — Ambulatory Visit: Payer: Medicare Other | Admitting: Internal Medicine

## 2015-12-20 DIAGNOSIS — K429 Umbilical hernia without obstruction or gangrene: Secondary | ICD-10-CM | POA: Diagnosis not present

## 2015-12-20 DIAGNOSIS — K4020 Bilateral inguinal hernia, without obstruction or gangrene, not specified as recurrent: Secondary | ICD-10-CM | POA: Diagnosis not present

## 2015-12-20 DIAGNOSIS — M6208 Separation of muscle (nontraumatic), other site: Secondary | ICD-10-CM | POA: Diagnosis not present

## 2015-12-20 DIAGNOSIS — Z01818 Encounter for other preprocedural examination: Secondary | ICD-10-CM | POA: Diagnosis not present

## 2015-12-20 NOTE — Telephone Encounter (Signed)
Per Katie: Pt can be worked in on Thursday 12-23-15 at 4:30pm slot.   Attempted to call pt. Received busy signal. Will try back.

## 2015-12-21 ENCOUNTER — Telehealth: Payer: Self-pay | Admitting: Cardiology

## 2015-12-21 ENCOUNTER — Ambulatory Visit (INDEPENDENT_AMBULATORY_CARE_PROVIDER_SITE_OTHER)
Admission: RE | Admit: 2015-12-21 | Discharge: 2015-12-21 | Disposition: A | Discharge status: Home / Self Care | Payer: Medicare Other | Source: Ambulatory Visit | Attending: Adult Health | Admitting: Adult Health

## 2015-12-21 ENCOUNTER — Ambulatory Visit (INDEPENDENT_AMBULATORY_CARE_PROVIDER_SITE_OTHER): Payer: Medicare Other | Admitting: Adult Health

## 2015-12-21 ENCOUNTER — Ambulatory Visit: Payer: Self-pay | Admitting: Surgery

## 2015-12-21 ENCOUNTER — Encounter: Payer: Self-pay | Admitting: Adult Health

## 2015-12-21 VITALS — BP 132/76 | HR 71 | Temp 97.8°F | Ht 62.0 in | Wt 146.0 lb

## 2015-12-21 DIAGNOSIS — J189 Pneumonia, unspecified organism: Secondary | ICD-10-CM | POA: Diagnosis not present

## 2015-12-21 DIAGNOSIS — J449 Chronic obstructive pulmonary disease, unspecified: Secondary | ICD-10-CM | POA: Diagnosis not present

## 2015-12-21 DIAGNOSIS — J45909 Unspecified asthma, uncomplicated: Secondary | ICD-10-CM

## 2015-12-21 DIAGNOSIS — G4733 Obstructive sleep apnea (adult) (pediatric): Secondary | ICD-10-CM | POA: Diagnosis not present

## 2015-12-21 DIAGNOSIS — Z01818 Encounter for other preprocedural examination: Secondary | ICD-10-CM | POA: Diagnosis not present

## 2015-12-21 NOTE — Telephone Encounter (Signed)
This slot is now used and patient will need to be seen by NP in the office.

## 2015-12-21 NOTE — Telephone Encounter (Signed)
Called DeeDee @ [facility] and let her know appt has been scheduled.

## 2015-12-21 NOTE — Patient Instructions (Addendum)
Continue on Dulera 2 puffs Twice daily   Continue on CPAP At bedtime  .  Follow up Dr. Annamaria Boots  In 2 weeks with chest xray (nipple markers) .  Please contact office for sooner follow up if symptoms do not improve or worsen or seek emergency care

## 2015-12-21 NOTE — Telephone Encounter (Signed)
Patient's wife called and scheduled appt with [name] for this afternoon.

## 2015-12-21 NOTE — Progress Notes (Signed)
Subjective:    Patient ID: [name]., male    DOB: 17-Jul-1930, 82 y.o.   MRN: [id number]  HPI 82 yo male former smoker with severe chronic asthma , sleep apnea on CPAP , AR on vaccine  Hx of recurrent aspiration PNA   TEST  07/2013 FEV1 68%, ratio 65, FVC 81%  12/21/2015 Post hospital follow up  Pt returns for a post hospital follow-up. Patient has had several hospitalizations over the last few months. He was admitted last week for asthma flare , HCAP . CXR showed a RLL lobar PNA. Treated with antibiotics , discharged on Zithromax. Has one day of Zithromax left. He is feeling better with decreased cough and congestion . He still gets winded. CXR today shows improved aeration in RLL with no acute infiltrate . Nodular density along left base ? Nipple shadow.  Discussed repeating cxr on return.  He remains on [facility] Twice daily  . No increased SABA use since last ov .   Has OSA on CPAP  At bedtime  . Doing well .   PVX and Prevnar are utd.   Here for preop clearance. Plans for an ellective bilateral inguinal hernia repair with Dr. Johney Maine.  We discussed that would like him to be a little stronger and to finish abx from recent hospitalization from HCAP prior to scheduling surgery . CXR is improved but will repeat in 2 weeks with nipple markers as above.     Past Medical History:  Diagnosis Date  . Arthritis   . ASTHMA   . Benign liver cyst   . CAD (coronary artery disease)    a. s/p CABG;  b. cath 06/27/10: S-Dx occluded, S-PDA 80-90% (tx with PCI); S-OM ok, L-LAD ok;  EF 65-70%  c.  s/p Promus DES to S-PDA 06/2010;   d. Cath 8 2016 LIMA to the LAD patent, SVG to posterior lateral subtotal, SVG to diagonal occluded chronically. The patient had stenting of the native right vessel.  . Cataract    surgery to both eyes  . Chronic diastolic CHF (congestive heart failure), NYHA class 2 ([place])   . CKD (chronic kidney disease), stage II   . COPD (chronic obstructive pulmonary disease)  ([place])   . Depression with anxiety   . Diverticulosis   . DYSLIPIDEMIA   . Esophageal stricture   . Essential hypertension    Echo 3/12: EF 55-60%; mod LVH; mild AS/AI; LAE; PASP 38; mild pulmo HTN  . GERD    with HH, hx esophageal stricture  . Glaucoma   . Hiatal hernia   . Osteoporosis   . PAF (paroxysmal atrial fibrillation) ([place])   . Personal history of alcoholism ([place])    quit etoh and abstinent since age 25.   Marland Kitchen Pneumonia 04/2013 hosp  . Skin cancer    L forearm  . SLEEP APNEA 09/2001   NPSG AHI 22/HR  . Subclavian arterial stenosis ([facility])    a. s/p PTCA of subclavian 2016.   Current Outpatient Prescriptions on File Prior to Visit  Medication Sig Dispense Refill  . acetaminophen (TYLENOL) 325 MG tablet Take 650 mg by mouth every 6 (six) hours as needed for moderate pain or headache.     . albuterol (PROAIR HFA) 108 (90 Base) MCG/ACT inhaler Inhale 2 puffs into the lungs every 6 (six) hours as needed for wheezing or shortness of breath. 1 Inhaler 12  . apixaban (ELIQUIS) 5 MG TABS tablet Take 1 tablet (5 mg total) by  mouth 2 (two) times daily. 60 tablet 11  . bisoprolol (ZEBETA) 10 MG tablet Take 2 tablets (20 mg total) by mouth daily. 60 tablet 5  . calcium-vitamin D (OSCAL WITH D) 500-200 MG-UNIT per tablet Take 1 tablet by mouth 2 (two) times daily.    . cholestyramine (QUESTRAN) 4 g packet Take 2 packets (8 g total) by mouth daily after lunch. 60 each 12  . clopidogrel (PLAVIX) 75 MG tablet Take 1 tablet (75 mg total) by mouth daily with breakfast. 30 tablet 8  . dexlansoprazole (DEXILANT) 60 MG capsule Take 1 capsule (60 mg total) by mouth daily. 30 capsule 3  . ipratropium-albuterol (DUONEB) 0.5-2.5 (3) MG/3ML SOLN USE ONE VIAL VIA NEBULIZER EVERY 6 HOURS AS NEEDED FOR SHORTNESS OF BREATH 360 mL 5  . isosorbide mononitrate (IMDUR) 60 MG 24 hr tablet Take 1 tablet (60 mg total) by mouth 2 (two) times daily. 180 tablet 0  . latanoprost (XALATAN) 0.005 % ophthalmic solution  Place 1 drop into both eyes at bedtime.     Marland Kitchen loperamide (IMODIUM A-D) 2 MG tablet Take 1 tablet (2 mg total) by mouth 4 (four) times daily as needed for diarrhea or loose stools. 30 tablet 1  . LORazepam (ATIVAN) 0.5 MG tablet Take 0.5 mg by mouth 2 (two) times daily.    . mometasone-formoterol (DULERA) 100-5 MCG/ACT AERO Inhale 2 puffs into the lungs 2 (two) times daily. Rinse mouth after use    . montelukast (SINGULAIR) 10 MG tablet take 1 tablet by mouth at bedtime 90 tablet 3  . nitroGLYCERIN (NITROSTAT) 0.4 MG SL tablet Place 1 tablet (0.4 mg total) under the tongue every 5 (five) minutes as needed for chest pain. 25 tablet 0  . pravastatin (PRAVACHOL) 80 MG tablet Take 1 tablet (80 mg total) by mouth every morning. 90 tablet 3  . PRESCRIPTION MEDICATION Inhale into the lungs at bedtime. CPAP/ 12    . PRESCRIPTION MEDICATION Inject 1 each into the skin once a week. Allergy shots from Dr. Annamaria Boots (1:10) - self-administered - usually Thursday or Friday    . Probiotic Product (PROBIOTIC DAILY) CAPS Take 1 capsule by mouth 2 (two) times daily.     Marland Kitchen RA VITAMIN B-12 TR 1000 MCG TBCR take 1 tablet by mouth once daily 30 tablet 11  . ranitidine (ZANTAC) 150 MG tablet Take 1 tablet (150 mg total) by mouth 2 (two) times daily. 60 tablet 3  . spironolactone (ALDACTONE) 25 MG tablet Take 0.5 tablets (12.5 mg total) by mouth daily. 30 tablet 2  . torsemide (DEMADEX) 20 MG tablet Take 0.5 tablets (10 mg total) by mouth daily. 30 tablet 2  . traMADol (ULTRAM) 50 MG tablet Take 1 tablet (50 mg total) by mouth every 8 (eight) hours as needed. 21 tablet 0  . valsartan (DIOVAN) 320 MG tablet Take 1 tablet (320 mg total) by mouth daily. 90 tablet 3  . traZODone (DESYREL) 50 MG tablet Take 1 tablet (50 mg total) by mouth at bedtime as needed for sleep. (Patient not taking: Reported on 12/21/2015) 30 tablet 5   No current facility-administered medications on file prior to visit.       Review of  Systems Constitutional:   No  weight loss, night sweats,  Fevers, chills, + fatigue, or  lassitude.  HEENT:   No headaches,  Difficulty swallowing,  Tooth/dental problems, or  Sore throat,                No  sneezing, itching, ear ache, nasal congestion, post nasal drip,   CV:  No chest pain,  Orthopnea, PND, swelling in lower extremities, anasarca, dizziness, palpitations, syncope.   GI  No heartburn, indigestion, abdominal pain, nausea, vomiting, diarrhea, change in bowel habits, loss of appetite, bloody stools.   Resp:  .  No chest wall deformity  Skin: no rash or lesions.  GU: no dysuria, change in color of urine, no urgency or frequency.  No flank pain, no hematuria   MS:  No joint pain or swelling.  No decreased range of motion.  No back pain.  Psych:  No change in mood or affect. No depression or anxiety.  No memory loss.         Objective:   Physical Exam Vitals:   12/21/15 1647  BP: 132/76  Pulse: 71  Temp: 97.8 F (36.6 C)  TempSrc: Oral  SpO2: 96%  Weight: 146 lb (66.2 kg)  Height: 5\' 2"  (1.575 m)    GEN: A/Ox3; pleasant , NAD frail and elderly    HEENT:  [place]/AT,  EACs-clear, TMs-wnl, NOSE-clear, THROAT-clear, no lesions, no postnasal drip or exudate noted.   NECK:  Supple w/ fair ROM; no JVD; normal carotid impulses w/o bruits; no thyromegaly or nodules palpated; no lymphadenopathy.    RESP  Decreased BS in bases , . no accessory muscle use, no dullness to percussion  CARD:  RRR, no m/r/g  , no peripheral edema, pulses intact, no cyanosis or clubbing.  GI:   Soft & nt; nml bowel sounds; no organomegaly or masses detected.   Musco: Warm bil, no deformities or joint swelling noted.   Neuro: alert, no focal deficits noted.    Skin: Warm, no lesions or rashes  [name] NP-C  [facility]  12/21/2015

## 2015-12-21 NOTE — Assessment & Plan Note (Signed)
Clinically improving  cxr is improved  Will need follow up cxr with nipple markers   Plan  Patient Instructions  Continue on Dulera 2 puffs Twice daily   Continue on CPAP At bedtime  .  Follow up Dr. Annamaria Boots  In 2 weeks with chest xray (nipple markers) .  Please contact office for sooner follow up if symptoms do not improve or worsen or seek emergency care

## 2015-12-21 NOTE — Assessment & Plan Note (Signed)
Patient Instructions  Continue on Dulera 2 puffs Twice daily   Continue on CPAP At bedtime  .  Follow up Dr. Annamaria Boots  In 2 weeks with chest xray (nipple markers) .  Please contact office for sooner follow up if symptoms do not improve or worsen or seek emergency care

## 2015-12-21 NOTE — Telephone Encounter (Signed)
Surgical Clearance routed to MD Hochrein.

## 2015-12-21 NOTE — Assessment & Plan Note (Signed)
Recent flare now resolving   Plan  Patient Instructions  Continue on Dulera 2 puffs Twice daily   Continue on CPAP At bedtime  .  Follow up Dr. Annamaria Boots  In 2 weeks with chest xray (nipple markers) .  Please contact office for sooner follow up if symptoms do not improve or worsen or seek emergency care

## 2015-12-21 NOTE — Telephone Encounter (Signed)
This patient would be high risk for any surgical procedure given his multiple comorbid conditions and frequent hospitalizations. No further cardiovascular testing or change in medications would alter this potential risk.  Please send this message to the requesting MD.

## 2015-12-21 NOTE — H&P (Signed)
[name] 12/20/2015 3:06 PM Location: [facility] Patient #: [id number] DOB: 1930/07/14 Married / Language: English / Race: White Male  History of Present Illness Stephen Hector MD; 12/21/2015 1:36 PM) The patient is a 82 year old male who presents for an evaluation of a hernia. Note for "Hernia": Patient sent for surgical consultation for concern of inguinal hernias. Sent by [name].   Elderly gentleman. Recalls having an open right inguinal hernia. The 1960s. Right groin rather swollen more recently. He is noted some left groin pain especially in the past month. He rates the testicle. He's had some issues with pneumonia. Just, hospital couple days ago. Normally rather active person. Can walk a half hour without much difficulty. Wife claims to gets after trying keep up with him. Patient notes the that the groin pain especially in the left side is gradually worsened to the point that it uncomfortable to walk or stand for long periods of time. Straits him. To have it fixed. His deftly affecting his quality of life. Usually has a bowel movement every day. Occasionally loose.  Fall by cardiology as well. On chronic blood thinners. He initially was started on Plavix after stent was placed. He is in chronic atrial fibrillation. Has some mild carotid stenosis around 40-59 percent. Some question of a stroke recently. Patient and his wife claims that he is on Plavix and Eliquis. Last note by his cardiologist, [name], recommended continuing on Eliquis. She denies any significant rectal bleeding.   Other Problems [name], CMA; 12/20/2015 3:06 PM) Alcohol Abuse Asthma Cerebrovascular Accident Chronic Obstructive Lung Disease Congestive Heart Failure Emphysema Of Lung Gastroesophageal Reflux Disease High blood pressure Home Oxygen Use Melanoma Myocardial infarction Sleep Apnea  Past Surgical History [name], CMA;  12/20/2015 3:06 PM) Bypass Surgery for Poor Blood Flow to Legs Coronary Artery Bypass Graft  Allergies ([name], CMA; 12/20/2015 3:12 PM) Codeine Phosphate *ANALGESICS - OPIOID* Morphine Sulfate (Concentrate) *ANALGESICS - OPIOID* Lasix *DIURETICS* Lidocaine *CHEMICALS* Avelox *FLUOROQUINOLONES*  Medication History ([name], CMA; 12/20/2015 3:20 PM) Latanoprost (0.005% Solution, Ophthalmic) Active. LORazepam (0.5MG  Tablet, Oral) Active. Azithromycin (500MG  Tablet, Oral) Active. Cholestyramine (4GM Packet, Oral) Active. Loperamide HCl (2MG  Capsule, Oral) Active. Spironolactone (25MG  Tablet, Oral) Active. Spironolactone (50MG  Tablet, Oral) Active. Torsemide (20MG  Tablet, Oral) Active. Montelukast Sodium (10MG  Tablet, Oral) Active. Pravastatin Sodium (80MG  Tablet, Oral) Active. RaNITidine HCl (150MG  Tablet, Oral) Active. Tamsulosin HCl (0.4MG  Capsule, Oral) Active. Valsartan (320MG  Tablet, Oral) Active. Oscal 500/200 D-3 (500-200MG -UNIT Tablet, Oral) Active. Questran (4GM Packet, Oral) Active. Clopidogrel Bisulfate (75MG  Tablet, Oral) Active. Dexilant (60MG  Capsule DR, Oral) Active. Isosorbide Mononitrate ER (60MG  Tablet ER 24HR, Oral) Active. Eliquis (5MG  Tablet, Oral) Active. Medications Reconciled  Family History [name], CMA; 12/20/2015 3:06 PM) Alcohol Abuse Brother. Arthritis Sister. Heart Disease Father, Mother. Heart disease in male family member before age 6 Heart disease in male family member before age 55 Hypertension Mother. Respiratory Condition Sister.     Review of Systems [name] CMA; 12/20/2015 3:06 PM) General Present- Fatigue. Not Present- Appetite Loss, Chills, Fever, Night Sweats, Weight Gain and Weight Loss. Skin Present- Dryness. Not Present- Change in Wart/Mole, Hives, Jaundice, New Lesions, Non-Healing Wounds, Rash and Ulcer. HEENT Present- Hearing Loss, Ringing in the Ears,  Seasonal Allergies and Wears glasses/contact lenses. Not Present- Earache, Hoarseness, Nose Bleed, Oral Ulcers, Sinus Pain, Sore Throat, Visual Disturbances and Yellow Eyes. Respiratory Present- Bloody sputum, Chronic Cough, Difficulty Breathing, Snoring and Wheezing. Cardiovascular Present- Difficulty Breathing Lying Down  and Shortness of Breath. Not Present- Chest Pain, Leg Cramps, Palpitations, Rapid Heart Rate and Swelling of Extremities. Gastrointestinal Present- Chronic diarrhea and Indigestion. Not Present- Abdominal Pain, Bloating, Bloody Stool, Change in Bowel Habits, Constipation, Difficulty Swallowing, Excessive gas, Gets full quickly at meals, Hemorrhoids, Nausea, Rectal Pain and Vomiting. Musculoskeletal Present- Muscle Weakness. Not Present- Back Pain, Joint Pain, Joint Stiffness, Muscle Pain and Swelling of Extremities. Neurological Present- Decreased Memory and Weakness. Not Present- Fainting, Headaches, Numbness, Seizures, Tingling, Tremor and Trouble walking. Hematology Present- Blood Thinners. Not Present- Easy Bruising, Excessive bleeding, Gland problems, HIV and Persistent Infections.  Vitals ([name] CMA; 12/20/2015 3:11 PM) 12/20/2015 3:07 PM Weight: 147 lb Height: 62in Body Surface Area: 1.68 m Body Mass Index: 26.89 kg/m  Pulse: 86 (Regular)  BP: 162/82 (Sitting, Left Arm, Standard)      Assessment & Plan Stephen Hector MD; 12/21/2015 1:38 PM)  BILATERAL INGUINAL HERNIA WITHOUT OBSTRUCTION OR GANGRENE, RECURRENCE NOT SPECIFIED (K40.20) Impression: Recurrent right inguinal hernia with tip of the appendix in it = Littre's hernia. Reducible. Very sensitive but smaller left inguinal hernia.  Standard treatment would be hernia repair. I think he would benefit from laparoscopic preperitoneal approach with mesh. His risks are elevated given his advanced age and cardiopulmonary issues, but he otherwise is rather active. He feels strongly that the pain in his  left groin is severe enough that it is affecting his quality of life. He actually is rather active and tries to be very independent. He wishes to be aggressive and have it repaired.  The challenge for him as he just got out of the hospital last week with pneumonia. He will finish his azithromycin tomorrow. He is chronically anticoagulated for his atrial fibrillation and stroke.  Because he otherwise has pretty good exercise tolerance, I think he ultimately could tolerate surgery. However, I definitely would like cardiology clearance. Would like pulmonary clearance to make sure he's adequately recovered from his recent hospitalization for pneumonia before proceeding with surgery. He recently saw both specialists, so hopefully will not be too involved  They claim he is on Plavix and Eliquis. That seems redundant. The last cardiology visit suggested Eliquis only. We'll clarify. Regardless, he needs to be off anticoagulation before I can do surgery. When a make sure that is safe. I'm assuming he should stay on Eliquis has his cardiologist last office visit noted. Therefore, usually hold 2 days prior to surgery. We'll risk of hematoma and bleeding is elevated with a preperitoneal approach, I think he will require that for his bilateral hernias and his umbilical hernia. Hopefully the umbilical hernia which is be primary repair.  Anticipate he would be an overnight stay given his cardiopulmonary issues. Make sure his hemoglobin etc. stable.  Current Plans I recommended obtaining preoperative cardiac clearance for recommendations on management of anticoagualtion perioperatively:  1. Timing of holding anticoagulation 2. Need for any bridge therapy (SQ enoxaparin, IV heparin, IV Aggrastat, etc) preop/postop. 3. Desired timing of resumption of anticoagulation. Does this patient required to be on Plavix and Eliquis. That's what they claim they are taking right now.  In general from Dr. Johney Maine'  standpoint:  Aspirin is okay to continue perioperatively (81mg  or 325mg ) & does not need to be held  Hold warfarin 5 days preoperatively. Consider PT/INR level on arrival to short stay the day of surgery. No need to check PT/INR level on preop visit  Hold P2Y12 inibitors such as clopidrogel (Plavix) 4 days preoperatively  Hold direct thrombin / factor  Xa inhibitors (Xerelto, Pradaxa, Eliquis, etc) 2 days preoperatively   Request clearance by cardiology to better assess operative risk & see if a reevaluation, further workup, etc is needed. Also recommendations on how medications such as for anticoagulation and blood pressure should be managed/held/restarted after surgery.  I recommended obtaining preoperative pulmonary clearance given his recent hospitalization for pneumonia. He is not on oxygen. He is not hypoxic. Occasionally done with antibiotics. Over would be good information to know what it is safe to proceed with elective surgery.. I am concerned about the health of the patient and the ability to tolerate the operation. Therefore, we will request clearance by pulmonary better assess operative risk & see if a reevaluation, further workup, etc is needed. Also recommendations on how medications and therapies should be managed/held/restarted after surgery. PREOP - ING HERNIA - ENCOUNTER FOR PREOPERATIVE EXAMINATION FOR GENERAL SURGICAL PROCEDURE (Z01.818)  Current Plans You are being scheduled for surgery - Our schedulers will call you.  You should hear from our office's scheduling department within 5 working days about the location, date, and time of surgery. We try to make accommodations for patient's preferences in scheduling surgery, but sometimes the OR schedule or the surgeon's schedule prevents [place] from making those accommodations.  If you have not heard from our office [phone]) in 5 working days, call the office and ask for your surgeon's nurse.  If you have other  questions about your diagnosis, plan, or surgery, call the office and ask for your surgeon's nurse.  Written instructions provided The anatomy & physiology of the abdominal wall and pelvic floor was discussed. The pathophysiology of hernias in the inguinal and pelvic region was discussed. Natural history risks such as progressive enlargement, pain, incarceration, and strangulation was discussed. Contributors to complications such as smoking, obesity, diabetes, prior surgery, etc were discussed.  I feel the risks of no intervention will lead to serious problems that outweigh the operative risks; therefore, I recommended surgery to reduce and repair the hernia. I explained laparoscopic techniques with possible need for an open approach. I noted usual use of mesh to patch and/or buttress hernia repair  Risks such as bleeding, infection, abscess, need for further treatment, heart attack, death, and other risks were discussed. I noted a good likelihood this will help address the problem. Goals of post-operative recovery were discussed as well. Possibility that this will not correct all symptoms was explained. I stressed the importance of low-impact activity, aggressive pain control, avoiding constipation, & not pushing through pain to minimize risk of post-operative chronic pain or injury. Possibility of reherniation was discussed. We will work to minimize complications.  An educational handout further explaining the pathology & treatment options was given as well. Questions were answered. The patient expresses understanding & wishes to proceed with surgery.  Pt Education - Pamphlet Given - Laparoscopic Hernia Repair: discussed with patient and provided information. Pt Education - [facility] Pain Control (Kalieb Freeland) Pt Education - [facility] Hernia Post-Op HCI (Militza Devery): discussed with patient and provided information. UMBILICAL HERNIA WITHOUT OBSTRUCTION AND WITHOUT GANGRENE (K42.9) Impression: Small but definite umbilical  hernia in the setting diastases recti. Would anticipate primary repair of the time of inguinal hernia surgery.  DIASTASIS RECTI (M62.08)  Current Plans Pt Educa tion - [facility] Diastasis Recti: discussed with patient and provided information.  [name], M.D., F.A.C.S. [facility] [address] [place],  [address] [phone] Main / Paging

## 2015-12-21 NOTE — Telephone Encounter (Signed)
New message        Request for surgical clearance:  What type of surgery is being performed? Hernia repair When is this surgery scheduled? Pending clearance Are there any medications that need to be held prior to surgery and how long? Hold eliquis and plavix Name of physician performing surgery?  Dr Johney Maine What is your office phone and fax number? Fax [phone]

## 2015-12-22 ENCOUNTER — Telehealth: Payer: Self-pay | Admitting: Cardiology

## 2015-12-22 DIAGNOSIS — K219 Gastro-esophageal reflux disease without esophagitis: Secondary | ICD-10-CM | POA: Diagnosis not present

## 2015-12-22 DIAGNOSIS — I130 Hypertensive heart and chronic kidney disease with heart failure and stage 1 through stage 4 chronic kidney disease, or unspecified chronic kidney disease: Secondary | ICD-10-CM | POA: Diagnosis not present

## 2015-12-22 DIAGNOSIS — Z7901 Long term (current) use of anticoagulants: Secondary | ICD-10-CM | POA: Diagnosis not present

## 2015-12-22 DIAGNOSIS — Z951 Presence of aortocoronary bypass graft: Secondary | ICD-10-CM | POA: Diagnosis not present

## 2015-12-22 DIAGNOSIS — G4733 Obstructive sleep apnea (adult) (pediatric): Secondary | ICD-10-CM | POA: Diagnosis not present

## 2015-12-22 DIAGNOSIS — E871 Hypo-osmolality and hyponatremia: Secondary | ICD-10-CM | POA: Diagnosis not present

## 2015-12-22 DIAGNOSIS — J449 Chronic obstructive pulmonary disease, unspecified: Secondary | ICD-10-CM | POA: Diagnosis not present

## 2015-12-22 DIAGNOSIS — I2510 Atherosclerotic heart disease of native coronary artery without angina pectoris: Secondary | ICD-10-CM | POA: Diagnosis not present

## 2015-12-22 DIAGNOSIS — I5032 Chronic diastolic (congestive) heart failure: Secondary | ICD-10-CM | POA: Diagnosis not present

## 2015-12-22 DIAGNOSIS — J45909 Unspecified asthma, uncomplicated: Secondary | ICD-10-CM | POA: Diagnosis not present

## 2015-12-22 DIAGNOSIS — I4891 Unspecified atrial fibrillation: Secondary | ICD-10-CM | POA: Diagnosis not present

## 2015-12-22 DIAGNOSIS — I739 Peripheral vascular disease, unspecified: Secondary | ICD-10-CM | POA: Diagnosis not present

## 2015-12-22 DIAGNOSIS — N182 Chronic kidney disease, stage 2 (mild): Secondary | ICD-10-CM | POA: Diagnosis not present

## 2015-12-22 DIAGNOSIS — Z8719 Personal history of other diseases of the digestive system: Secondary | ICD-10-CM | POA: Diagnosis not present

## 2015-12-22 NOTE — Telephone Encounter (Signed)
Mrs. [name] is calling to find out if [name] is needing to take two blood thinners . He is on Plavix and Eliquis . Please call

## 2015-12-22 NOTE — Telephone Encounter (Signed)
Spoke to wife who had questions regarding the Eliquis and Plavix taken concurrently, has had patient's non-cardiology physicians asking about "which" med pt is on, assuming he shouldn't be on both. Discussed in detail rationale for taking plavix (post stenting for prevention of restenosis) and eliquis (for atrial fibrillation). Explained that these medications work on 2 different clotting processes in the blood and that reasonably should not be concerned at taking both, because they have been shown to be safe taken concurrently. Advised to report, of course, any S&S of irreg bleed. She voiced understanding of recommendations and thanks for the explanation of medication use.

## 2015-12-23 ENCOUNTER — Other Ambulatory Visit: Payer: Self-pay | Admitting: *Deleted

## 2015-12-23 DIAGNOSIS — Z01818 Encounter for other preprocedural examination: Secondary | ICD-10-CM | POA: Insufficient documentation

## 2015-12-23 NOTE — Assessment & Plan Note (Signed)
Plans for bilateral inguinal hernia repair -will hold off for now until seen back in 2 weeks  Hope to get stronger from recent HCAP , finish abx.  Can consider spirometry on return

## 2015-12-23 NOTE — Patient Outreach (Signed)
[facility]) Care Management  12/23/2015  Daegon Boline Bost Jr. 1931/02/17 [id number]   RN spoke with pt's spouse who is consented ( pt HOH ) Susette Racer.  Spouse reports update indicated pt continues to weight at 144 lbs today however the [place] are not working even after a change in the batteries. RN requested caregiver to inform the involved [place] agency for possible replacement for ongoing monitoring. States pt remain sin the GREEN zone with no swelling as he continues to wear his compression stockings with no problems and uses his CPAP device at night. Reports the [facility] recently visits and continues to take pt's BP and educate ongoing needs. Caregiver also reported pt recent had a visit with his cardiologist who indicated pt's planned hernia surgery would be delayed due to pt's continuing to have some residual pneumonia. RN continues to offer community home visits and available community resources to assist pt further in managing his care however at this time receptive to ongoing telephone transition of care. RN offered to follow up next week with ongoing transition of care contact. Will follow up accordingly.  Raina Mina, RN Care Management Coordinator [place] Office [phone]

## 2015-12-24 DIAGNOSIS — Z7901 Long term (current) use of anticoagulants: Secondary | ICD-10-CM | POA: Diagnosis not present

## 2015-12-24 DIAGNOSIS — Z951 Presence of aortocoronary bypass graft: Secondary | ICD-10-CM | POA: Diagnosis not present

## 2015-12-24 DIAGNOSIS — I5032 Chronic diastolic (congestive) heart failure: Secondary | ICD-10-CM | POA: Diagnosis not present

## 2015-12-24 DIAGNOSIS — I739 Peripheral vascular disease, unspecified: Secondary | ICD-10-CM | POA: Diagnosis not present

## 2015-12-24 DIAGNOSIS — K219 Gastro-esophageal reflux disease without esophagitis: Secondary | ICD-10-CM | POA: Diagnosis not present

## 2015-12-24 DIAGNOSIS — I130 Hypertensive heart and chronic kidney disease with heart failure and stage 1 through stage 4 chronic kidney disease, or unspecified chronic kidney disease: Secondary | ICD-10-CM | POA: Diagnosis not present

## 2015-12-24 DIAGNOSIS — E871 Hypo-osmolality and hyponatremia: Secondary | ICD-10-CM | POA: Diagnosis not present

## 2015-12-24 DIAGNOSIS — I2510 Atherosclerotic heart disease of native coronary artery without angina pectoris: Secondary | ICD-10-CM | POA: Diagnosis not present

## 2015-12-24 DIAGNOSIS — N182 Chronic kidney disease, stage 2 (mild): Secondary | ICD-10-CM | POA: Diagnosis not present

## 2015-12-24 DIAGNOSIS — J449 Chronic obstructive pulmonary disease, unspecified: Secondary | ICD-10-CM | POA: Diagnosis not present

## 2015-12-24 DIAGNOSIS — J45909 Unspecified asthma, uncomplicated: Secondary | ICD-10-CM | POA: Diagnosis not present

## 2015-12-24 DIAGNOSIS — I4891 Unspecified atrial fibrillation: Secondary | ICD-10-CM | POA: Diagnosis not present

## 2015-12-24 DIAGNOSIS — Z8719 Personal history of other diseases of the digestive system: Secondary | ICD-10-CM | POA: Diagnosis not present

## 2015-12-24 DIAGNOSIS — G4733 Obstructive sleep apnea (adult) (pediatric): Secondary | ICD-10-CM | POA: Diagnosis not present

## 2015-12-24 NOTE — Telephone Encounter (Signed)
Clearance faxed to [facility] via faxed machine and Epic

## 2015-12-27 ENCOUNTER — Telehealth: Payer: Self-pay | Admitting: Internal Medicine

## 2015-12-27 DIAGNOSIS — N182 Chronic kidney disease, stage 2 (mild): Secondary | ICD-10-CM | POA: Diagnosis not present

## 2015-12-27 DIAGNOSIS — E871 Hypo-osmolality and hyponatremia: Secondary | ICD-10-CM | POA: Diagnosis not present

## 2015-12-27 DIAGNOSIS — J45909 Unspecified asthma, uncomplicated: Secondary | ICD-10-CM | POA: Diagnosis not present

## 2015-12-27 DIAGNOSIS — Z951 Presence of aortocoronary bypass graft: Secondary | ICD-10-CM | POA: Diagnosis not present

## 2015-12-27 DIAGNOSIS — I4891 Unspecified atrial fibrillation: Secondary | ICD-10-CM | POA: Diagnosis not present

## 2015-12-27 DIAGNOSIS — K219 Gastro-esophageal reflux disease without esophagitis: Secondary | ICD-10-CM | POA: Diagnosis not present

## 2015-12-27 DIAGNOSIS — J449 Chronic obstructive pulmonary disease, unspecified: Secondary | ICD-10-CM | POA: Diagnosis not present

## 2015-12-27 DIAGNOSIS — Z8719 Personal history of other diseases of the digestive system: Secondary | ICD-10-CM | POA: Diagnosis not present

## 2015-12-27 DIAGNOSIS — I130 Hypertensive heart and chronic kidney disease with heart failure and stage 1 through stage 4 chronic kidney disease, or unspecified chronic kidney disease: Secondary | ICD-10-CM | POA: Diagnosis not present

## 2015-12-27 DIAGNOSIS — I2510 Atherosclerotic heart disease of native coronary artery without angina pectoris: Secondary | ICD-10-CM | POA: Diagnosis not present

## 2015-12-27 DIAGNOSIS — I5032 Chronic diastolic (congestive) heart failure: Secondary | ICD-10-CM | POA: Diagnosis not present

## 2015-12-27 DIAGNOSIS — I739 Peripheral vascular disease, unspecified: Secondary | ICD-10-CM | POA: Diagnosis not present

## 2015-12-27 DIAGNOSIS — Z7901 Long term (current) use of anticoagulants: Secondary | ICD-10-CM | POA: Diagnosis not present

## 2015-12-27 DIAGNOSIS — G4733 Obstructive sleep apnea (adult) (pediatric): Secondary | ICD-10-CM | POA: Diagnosis not present

## 2015-12-27 NOTE — Telephone Encounter (Signed)
Had nursing visit today.  Patient had fall over the weekend.  Had bleeding but got under control.  Has open tear on left elbow.  Also has a little mark on left side of face.  No other injuries reported.

## 2015-12-27 NOTE — Telephone Encounter (Signed)
Has hosp f/u scheduled.

## 2015-12-27 NOTE — Telephone Encounter (Signed)
Please advise if pt should come in for visit.

## 2015-12-28 ENCOUNTER — Encounter: Payer: Self-pay | Admitting: Internal Medicine

## 2015-12-28 ENCOUNTER — Ambulatory Visit (INDEPENDENT_AMBULATORY_CARE_PROVIDER_SITE_OTHER): Payer: Medicare Other | Admitting: Internal Medicine

## 2015-12-28 ENCOUNTER — Other Ambulatory Visit (INDEPENDENT_AMBULATORY_CARE_PROVIDER_SITE_OTHER): Payer: Medicare Other

## 2015-12-28 VITALS — BP 154/62 | HR 74 | Temp 98.4°F | Resp 16 | Wt 144.0 lb

## 2015-12-28 DIAGNOSIS — I10 Essential (primary) hypertension: Secondary | ICD-10-CM

## 2015-12-28 DIAGNOSIS — K529 Noninfective gastroenteritis and colitis, unspecified: Secondary | ICD-10-CM

## 2015-12-28 DIAGNOSIS — J189 Pneumonia, unspecified organism: Secondary | ICD-10-CM

## 2015-12-28 DIAGNOSIS — E871 Hypo-osmolality and hyponatremia: Secondary | ICD-10-CM

## 2015-12-28 DIAGNOSIS — K4020 Bilateral inguinal hernia, without obstruction or gangrene, not specified as recurrent: Secondary | ICD-10-CM

## 2015-12-28 DIAGNOSIS — R739 Hyperglycemia, unspecified: Secondary | ICD-10-CM | POA: Diagnosis not present

## 2015-12-28 DIAGNOSIS — R2681 Unsteadiness on feet: Secondary | ICD-10-CM | POA: Diagnosis not present

## 2015-12-28 DIAGNOSIS — J449 Chronic obstructive pulmonary disease, unspecified: Secondary | ICD-10-CM

## 2015-12-28 LAB — COMPREHENSIVE METABOLIC PANEL
ALK PHOS: 53 U/L (ref 39–117)
ALT: 9 U/L (ref 0–53)
AST: 13 U/L (ref 0–37)
Albumin: 3.9 g/dL (ref 3.5–5.2)
BILIRUBIN TOTAL: 0.5 mg/dL (ref 0.2–1.2)
BUN: 19 mg/dL (ref 6–23)
CO2: 31 meq/L (ref 19–32)
CREATININE: 1.08 mg/dL (ref 0.40–1.50)
Calcium: 8.8 mg/dL (ref 8.4–10.5)
Chloride: 95 mEq/L — ABNORMAL LOW (ref 96–112)
GFR: 69.43 mL/min (ref >60.00)
GLUCOSE: 88 mg/dL (ref 70–99)
Potassium: 3.8 mEq/L (ref 3.5–5.1)
SODIUM: 129 meq/L — AB (ref 135–145)
TOTAL PROTEIN: 6.6 g/dL (ref 6.0–8.3)

## 2015-12-28 LAB — HEMOGLOBIN A1C: Hgb A1c MFr Bld: 5.2 % (ref 4.6–6.5)

## 2015-12-28 NOTE — Assessment & Plan Note (Signed)
a1c

## 2015-12-28 NOTE — Progress Notes (Signed)
Pre visit review using our clinic review tool, if applicable. No additional management support is needed unless otherwise documented below in the visit note. 

## 2015-12-28 NOTE — Assessment & Plan Note (Signed)
Having pain - has seen surgery and will have surgery once stable Tramadol helping pain - will continue

## 2015-12-28 NOTE — Assessment & Plan Note (Signed)
Two episodes in last 14 days Continue questran daily  imodium as needed Has f/u with GI

## 2015-12-28 NOTE — Assessment & Plan Note (Signed)
Controlled for him Continue current medications at current doses cmp today

## 2015-12-28 NOTE — Assessment & Plan Note (Signed)
Related to chronic diarrhea and CHF Na was normal when he left the hospital  Recheck today Has nephrology appt later this month

## 2015-12-28 NOTE — Assessment & Plan Note (Addendum)
Chronic symptoms from COPD with acute symptoms from pneumonia (HCAP) Completed antibiotics and has already seen pulmonary Slow improvement Continue inhalers Has follow up cxr and appt with pulmonary

## 2015-12-28 NOTE — Patient Instructions (Addendum)
  Test(s) ordered today. Your results will be released to MyChart (or called to you) after review, usually within 72hours after test completion. If any changes need to be made, you will be notified at that same time.  All other Health Maintenance issues reviewed.   All recommended immunizations and age-appropriate screenings are up-to-date or discussed.  No immunizations administered today.   Medications reviewed and updated.  No changes recommended at this time.  Your prescription(s) have been submitted to your pharmacy. Please take as directed and contact our office if you believe you are having problem(s) with the medication(s).   Please followup in 3 months   

## 2015-12-28 NOTE — Assessment & Plan Note (Signed)
Has fallen a few days ago and high risk of falls Deconditioned, poor balance Using cane Has done PT in recent past and it helped Referred to PT today

## 2015-12-28 NOTE — Progress Notes (Signed)
Subjective:    Patient ID: Stephen Door., male    DOB: 01-22-1931, 82 y.o.   MRN: [id number]  HPI He is here for follow from the hospital.  He was in the hospital 9/4-9/7.  He has been hospitalized a few times over the summer.  He was discharged last in July and has remained weak since then, but was getting weaker.  His gait has also been more unsteady.  He has had a cough for months that has been productive of white phlegm.  The day before going to the ED he was getting SOB at rest.  He was not having chest pain, but did have pain in his right mid to upper back .   He has continued to have diarrhea.    He was diagnosed with pneumonia and treated with vanco and cefepime.  Blood cultures were negative.  He was discharged with zithromax 500 mg daily for an additional 5 days.   His hyponatremia improved and was felt to be a combination of CHF and diarrhea. His case was discussed with nephrology and he has a follow up appointment later this month.   Chronic diarrhea:  The cause is unclear.  He is taking questran 8 g daily.  He has seen GI and had a flex sigmoid 7/27.    His COPD, Afib, Htn, CAD, chronic diastolic heart failure were stable.   He fell at home over the weekend.  He tripped outside.  He scratched his face and his left elbow. His elbow bled a lot, but has not bled since then.  There is not surrounding redness.  Pneumonia, Cough: He continues to cough and continue to bring up white phlegm.  He has seen pulmonary since being discharged.  His chest xray was improving.  He uses his inhalers daily  He has a follow up with pulmonary and will have a repeat chest xray.  His energy level is still very low.    Diarrhea:  He has had diarrhea twice in the past 14 days.  He is taking the [place] daily.  He has taken imodium once since being discharged. He denies abdominal pain.   Unsteadiness:  He has felt unsteady.  He uses a cane.  He has a Corporate investment banker at home but does not use it.  He has  done PT in the past and it helped.   Hyponatremia:  His sodium was normal when he was discharged.  He has a follow up with nephrology later this month.    Inguinal hernia:  He is still having pain from the hernia.  He was started on tramadol and it does help.   Medications and allergies reviewed with patient and updated if appropriate.  Patient Active Problem List   Diagnosis Date Noted  . Preoperative clearance 12/23/2015  . HCAP (healthcare-associated pneumonia) 12/13/2015  . Inguinal hernia, b/l 11/11/2015  . Chronic diarrhea   . Depression with anxiety   . UTI (lower urinary tract infection) 11/03/2015  . CKD (chronic kidney disease), stage II 10/21/2015  . Symptomatic bradycardia 10/17/2015  . Chronic diastolic CHF (congestive heart failure), NYHA class 2 ([place])   . COPD (chronic obstructive pulmonary disease) ([place]) 09/23/2015  . COPD exacerbation ([place]) 09/23/2015  . Sleeping difficulties 09/08/2015  . Cerebral embolism with cerebral infarction 05/18/2015  . Coronary artery disease involving native coronary artery of native heart   . Numbness and tingling in right hand 05/17/2015  . Hyponatremia 05/17/2015  . Essential hypertension   .  Intermittent confusion   . Cough 04/13/2015  . PAD (peripheral artery disease) ([place]) 02/22/2015  . Subclavian artery stenosis, left 12/23/2014  . NSTEMI (non-ST elevated myocardial infarction) ([place])   . Hx of CABG 2008 08/31/2014  . CAD S/P S-PDA DES 2012 08/31/2014  . PVD (peripheral vascular disease) ([place]) 08/31/2014  . Murmur 08/31/2014  . Anxiety about health 08/31/2014  . Abnormal chest x-ray 03/26/2014  . Abnormal CT scan, esophagus 01/07/2014  . Generalized anxiety disorder   . Anemia, iron deficiency   . PAF (paroxysmal atrial fibrillation) ([place]) 03/08/2013  . Chronic diastolic heart failure ([place]) 10/30/2012  . Osteoporosis/severe osteopenia 06/12/2012  . Bruit of left carotid artery 09/27/2011  . GERD 11/19/2009  .  Hyperlipidemia with target LDL less than 100 10/04/2009  . ESOPHAGEAL STRICTURE 12/03/2007  . Coronary atherosclerosis 05/10/2007  . Seasonal and perennial allergic rhinitis 05/10/2007  . Asthma with COPD ([place]) 05/10/2007  . Obstructive sleep apnea 05/10/2007    Current Outpatient Prescriptions on File Prior to Visit  Medication Sig Dispense Refill  . acetaminophen (TYLENOL) 325 MG tablet Take 650 mg by mouth every 6 (six) hours as needed for moderate pain or headache.     . albuterol (PROAIR HFA) 108 (90 Base) MCG/ACT inhaler Inhale 2 puffs into the lungs every 6 (six) hours as needed for wheezing or shortness of breath. 1 Inhaler 12  . apixaban (ELIQUIS) 5 MG TABS tablet Take 1 tablet (5 mg total) by mouth 2 (two) times daily. 60 tablet 11  . bisoprolol (ZEBETA) 10 MG tablet Take 2 tablets (20 mg total) by mouth daily. 60 tablet 5  . calcium-vitamin D (OSCAL WITH D) 500-200 MG-UNIT per tablet Take 1 tablet by mouth 2 (two) times daily.    . cholestyramine (QUESTRAN) 4 g packet Take 2 packets (8 g total) by mouth daily after lunch. 60 each 12  . clopidogrel (PLAVIX) 75 MG tablet Take 1 tablet (75 mg total) by mouth daily with breakfast. 30 tablet 8  . dexlansoprazole (DEXILANT) 60 MG capsule Take 1 capsule (60 mg total) by mouth daily. 30 capsule 3  . ipratropium-albuterol (DUONEB) 0.5-2.5 (3) MG/3ML SOLN USE ONE VIAL VIA NEBULIZER EVERY 6 HOURS AS NEEDED FOR SHORTNESS OF BREATH 360 mL 5  . isosorbide mononitrate (IMDUR) 60 MG 24 hr tablet Take 1 tablet (60 mg total) by mouth 2 (two) times daily. 180 tablet 0  . latanoprost (XALATAN) 0.005 % ophthalmic solution Place 1 drop into both eyes at bedtime.     Marland Kitchen loperamide (IMODIUM A-D) 2 MG tablet Take 1 tablet (2 mg total) by mouth 4 (four) times daily as needed for diarrhea or loose stools. 30 tablet 1  . LORazepam (ATIVAN) 0.5 MG tablet Take 0.5 mg by mouth 2 (two) times daily.    . mometasone-formoterol (DULERA) 100-5 MCG/ACT AERO Inhale 2  puffs into the lungs 2 (two) times daily. Rinse mouth after use    . montelukast (SINGULAIR) 10 MG tablet take 1 tablet by mouth at bedtime 90 tablet 3  . nitroGLYCERIN (NITROSTAT) 0.4 MG SL tablet Place 1 tablet (0.4 mg total) under the tongue every 5 (five) minutes as needed for chest pain. 25 tablet 0  . pravastatin (PRAVACHOL) 80 MG tablet Take 1 tablet (80 mg total) by mouth every morning. 90 tablet 3  . PRESCRIPTION MEDICATION Inhale into the lungs at bedtime. CPAP/ 12    . PRESCRIPTION MEDICATION Inject 1 each into the skin once a week. Allergy shots from Dr.  Young (1:10) - self-administered - usually Thursday or Friday    . Probiotic Product (PROBIOTIC DAILY) CAPS Take 1 capsule by mouth 2 (two) times daily.     Marland Kitchen RA VITAMIN B-12 TR 1000 MCG TBCR take 1 tablet by mouth once daily 30 tablet 11  . ranitidine (ZANTAC) 150 MG tablet Take 1 tablet (150 mg total) by mouth 2 (two) times daily. 60 tablet 3  . spironolactone (ALDACTONE) 25 MG tablet Take 0.5 tablets (12.5 mg total) by mouth daily. 30 tablet 2  . torsemide (DEMADEX) 20 MG tablet Take 0.5 tablets (10 mg total) by mouth daily. 30 tablet 2  . traMADol (ULTRAM) 50 MG tablet Take 1 tablet (50 mg total) by mouth every 8 (eight) hours as needed. 21 tablet 0  . traZODone (DESYREL) 50 MG tablet Take 1 tablet (50 mg total) by mouth at bedtime as needed for sleep. 30 tablet 5  . valsartan (DIOVAN) 320 MG tablet Take 1 tablet (320 mg total) by mouth daily. 90 tablet 3   No current facility-administered medications on file prior to visit.     Past Medical History:  Diagnosis Date  . Arthritis   . ASTHMA   . Benign liver cyst   . CAD (coronary artery disease)    a. s/p CABG;  b. cath 06/27/10: S-Dx occluded, S-PDA 80-90% (tx with PCI); S-OM ok, L-LAD ok;  EF 65-70%  c.  s/p Promus DES to S-PDA 06/2010;   d. Cath 8 2016 LIMA to the LAD patent, SVG to posterior lateral subtotal, SVG to diagonal occluded chronically. The patient had stenting of  the native right vessel.  . Cataract    surgery to both eyes  . Chronic diastolic CHF (congestive heart failure), NYHA class 2 ([place])   . CKD (chronic kidney disease), stage II   . COPD (chronic obstructive pulmonary disease) ([place])   . Depression with anxiety   . Diverticulosis   . DYSLIPIDEMIA   . Esophageal stricture   . Essential hypertension    Echo 3/12: EF 55-60%; mod LVH; mild AS/AI; LAE; PASP 38; mild pulmo HTN  . GERD    with HH, hx esophageal stricture  . Glaucoma   . Hiatal hernia   . Osteoporosis   . PAF (paroxysmal atrial fibrillation) ([place])   . Personal history of alcoholism ([place])    quit etoh and abstinent since age 17.   Marland Kitchen Pneumonia 04/2013 hosp  . Skin cancer    L forearm  . SLEEP APNEA 09/2001   NPSG AHI 22/HR  . Subclavian arterial stenosis ([facility])    a. s/p PTCA of subclavian 2016.    Past Surgical History:  Procedure Laterality Date  . CARDIAC CATHETERIZATION N/A 11/26/2014   Procedure: Left Heart Cath and Cors/Grafts Angiography;  Surgeon: Sherren Mocha, MD;  Location: [place] [facility];  Service: Cardiovascular;  Laterality: N/A;  . CARDIAC CATHETERIZATION N/A 11/26/2014   Procedure: Coronary Stent Intervention;  Surgeon: Sherren Mocha, MD;  Location: [place] [facility];  Service: Cardiovascular;  Laterality: N/A;  . CATARACT EXTRACTION, BILATERAL  2007  . CHOLECYSTECTOMY  02/21/2011   Procedure: LAPAROSCOPIC CHOLECYSTECTOMY WITH INTRAOPERATIVE CHOLANGIOGRAM;  Surgeon: Pedro Earls, MD;  Location: [facility];  Service: General;  Laterality: N/A;  . CORONARY ANGIOPLASTY WITH STENT PLACEMENT  07/2010  . CORONARY ARTERY BYPASS GRAFT  02/2007  . FLEXIBLE SIGMOIDOSCOPY N/A 11/05/2015   Procedure: FLEXIBLE SIGMOIDOSCOPY;  Surgeon: Jerene Bears, MD;  Location[facility];  Service: Endoscopy;  Laterality: N/A;  . HERNIA REPAIR  unsure ?60's  . PERIPHERAL VASCULAR CATHETERIZATION N/A 02/22/2015   Procedure: Upper Extremity Angiography;  Surgeon: Lorretta Harp, MD;  Location: [place] [facility];  Service: Cardiovascular;  Laterality: N/A;  . TONSILLECTOMY      Social History   Social History  . Marital status: Married    Spouse name: N/A  . Number of children: 2  . Years of education: N/A   Occupational History  . retired Retired    32 years   Social History Main Topics  . Smoking status: Former Smoker    Packs/day: 3.00    Years: 40.00    Types: Cigarettes    Quit date: 04/11/1979  . Smokeless tobacco: Never Used  . Alcohol use No     Comment: quit drinking 40+ years  . Drug use: No  . Sexual activity: Yes   Other Topics Concern  . 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 Married lives with wife 2 children   Served 3 yrs in [employer], enterred service age 74, deployed to [place] age 40.      Family History  Problem Relation Age of Onset  . COPD Sister   . Asthma Sister   . Emphysema Sister   . Hypertension Sister   . Hyperlipidemia Sister   . Hypertension Mother   . Heart disease Mother   . Hyperlipidemia Mother   . Heart disease Father   . Hyperlipidemia Father   . Hypertension Father   . Heart disease Brother   . Colon cancer Neg Hx     Review of Systems  Constitutional: Positive for fatigue. Negative for appetite change and fever.  Respiratory: Positive for cough, shortness of breath and wheezing.   Gastrointestinal: Positive for diarrhea. Negative for abdominal pain, blood in stool and nausea.  Neurological: Negative for light-headedness and headaches.       Objective:   Vitals:   12/28/15 1303  BP: (!) 154/62  Pulse: 74  Resp: 16  Temp: 98.4 F (36.9 C)   Filed Weights   12/28/15 1303  Weight: 144 lb (65.3 kg)   Body mass index is 26.34 kg/m.   Physical Exam Constitutional: Appears well-developed and well-nourished. No distress.  HENT:  Head: Normocephalic and atraumatic.  Neck: Neck supple. No tracheal deviation present. No thyromegaly present.    Cardiovascular: Normal rate, regular rhythm and normal heart sounds.   No murmur heard. No carotid bruit  Pulmonary/Chest: Effort normal and breath sounds normal. No respiratory distress. No has no wheezes. No rales.  Musculoskeletal: trace edema with compression socks.  Lymphadenopathy: No cervical adenopathy.  Skin: Skin is warm and dry. Not diaphoretic. elbow bandaged that is clean and dry - evaluated by nurse at home, face with small healing abrasion that is not open and no surround erythema Psychiatric: Normal mood and affect. Behavior is normal.       Assessment & Plan:   See Problem List for Assessment and Plan of chronic medical problems.  F/u in 3 months

## 2015-12-29 ENCOUNTER — Other Ambulatory Visit: Payer: Self-pay | Admitting: *Deleted

## 2015-12-29 ENCOUNTER — Telehealth: Payer: Self-pay | Admitting: Cardiology

## 2015-12-29 NOTE — Patient Outreach (Signed)
[facility]) Care Management  12/29/2015  [name]. [birth year] [id number]   RN spoke with pt's spouse today [name] who is consented) due to pt's HOH. Spouse reported a visit from the [facility] who repaired the recent scales that were not working last week. States pt continues to weight with no fluctuation in his weight.  Spouse indicates no swelling or issues with his COPD as pt continues to due will on his outings as he continues to recover with no acute events or encounters. Plan of care reviewed along with all goals and RN continues to encouraged assistance from the spouse in pt meeting his goals. Will reassess next week on pt's progress and began weaning from the program if pt continues to make progress in managing his care. Caregiver remains aware that RN is available for home visits but once again receptive at this time to ongoing transition of care contacts. Offered to continue with a call next week as spouse was receptive. Appointment as been scheduled for a follow up call.   Raina Mina, RN Care Management Coordinator [place] Office [phone]

## 2015-12-29 NOTE — Patient Outreach (Signed)
[facility]) Care Management  12/29/2015  Stephen Cardello Pesta Jr. [birth year] [id number]   RN attempted to reach pt today however unsuccessful. RN able to leave a HIPAA approved voice message requesting a call back. Will inquire further on pt's ongoing management of care related to his COPD/HF. Will continues outreach calls and rescheduled another follow up next week.  Raina Mina, RN Care Management Coordinator [place] Office [phone]

## 2015-12-29 NOTE — Telephone Encounter (Signed)
Called - surgical scheduler not available. Left msg for her that I will follow up. Per office, surgeon needs clear wording that it is OK to stop the Plavix and ASA for the scheduled hernia repair - note that clearance at high risk was given in prior note.

## 2015-12-29 NOTE — Telephone Encounter (Signed)
New message       Received cardiac clearance but plavix and eliquis directions were not mentioned.  Please call

## 2015-12-30 ENCOUNTER — Ambulatory Visit (HOSPITAL_COMMUNITY)
Admission: RE | Admit: 2015-12-30 | Discharge: 2015-12-30 | Disposition: A | Discharge status: Home / Self Care | Payer: Medicare Other | Source: Ambulatory Visit | Attending: Cardiology | Admitting: Cardiology

## 2015-12-30 DIAGNOSIS — R0989 Other specified symptoms and signs involving the circulatory and respiratory systems: Secondary | ICD-10-CM

## 2015-12-30 DIAGNOSIS — I6523 Occlusion and stenosis of bilateral carotid arteries: Secondary | ICD-10-CM | POA: Insufficient documentation

## 2015-12-30 NOTE — Telephone Encounter (Signed)
He can hold the Eliquis for two days prior to the procedure.  Plavix could be held for 5 days prior to the procedure.  However, again, he is high risk both pre, intra and post op given his severe cardiovascular disease and overall frail state.

## 2015-12-30 NOTE — Telephone Encounter (Signed)
Clearance faxed to [facility]/Dr Gross via faxed machine

## 2016-01-03 ENCOUNTER — Other Ambulatory Visit: Payer: Self-pay | Admitting: *Deleted

## 2016-01-03 NOTE — Patient Outreach (Signed)
[facility]) Care Management  01/03/2016  Khael Lino Callan Jr. July 15, 1930 [id number]   Rn attempted outreach call today however pt not available. RN able to leave a HIPAA approved voice message requesting a call back. Will inquire further on pt's ongoing management of care. Will rescheduled another call for tomorrow.  Raina Mina, RN Care Management Coordinator [place] Office [phone]

## 2016-01-04 ENCOUNTER — Other Ambulatory Visit: Payer: Self-pay | Admitting: *Deleted

## 2016-01-04 DIAGNOSIS — N182 Chronic kidney disease, stage 2 (mild): Secondary | ICD-10-CM | POA: Diagnosis not present

## 2016-01-04 DIAGNOSIS — I5032 Chronic diastolic (congestive) heart failure: Secondary | ICD-10-CM | POA: Diagnosis not present

## 2016-01-04 DIAGNOSIS — Z951 Presence of aortocoronary bypass graft: Secondary | ICD-10-CM | POA: Diagnosis not present

## 2016-01-04 DIAGNOSIS — J45909 Unspecified asthma, uncomplicated: Secondary | ICD-10-CM | POA: Diagnosis not present

## 2016-01-04 DIAGNOSIS — I739 Peripheral vascular disease, unspecified: Secondary | ICD-10-CM | POA: Diagnosis not present

## 2016-01-04 DIAGNOSIS — Z8719 Personal history of other diseases of the digestive system: Secondary | ICD-10-CM | POA: Diagnosis not present

## 2016-01-04 DIAGNOSIS — K219 Gastro-esophageal reflux disease without esophagitis: Secondary | ICD-10-CM | POA: Diagnosis not present

## 2016-01-04 DIAGNOSIS — I2510 Atherosclerotic heart disease of native coronary artery without angina pectoris: Secondary | ICD-10-CM | POA: Diagnosis not present

## 2016-01-04 DIAGNOSIS — J449 Chronic obstructive pulmonary disease, unspecified: Secondary | ICD-10-CM | POA: Diagnosis not present

## 2016-01-04 DIAGNOSIS — G4733 Obstructive sleep apnea (adult) (pediatric): Secondary | ICD-10-CM | POA: Diagnosis not present

## 2016-01-04 DIAGNOSIS — Z7901 Long term (current) use of anticoagulants: Secondary | ICD-10-CM | POA: Diagnosis not present

## 2016-01-04 DIAGNOSIS — I130 Hypertensive heart and chronic kidney disease with heart failure and stage 1 through stage 4 chronic kidney disease, or unspecified chronic kidney disease: Secondary | ICD-10-CM | POA: Diagnosis not present

## 2016-01-04 DIAGNOSIS — E871 Hypo-osmolality and hyponatremia: Secondary | ICD-10-CM | POA: Diagnosis not present

## 2016-01-04 DIAGNOSIS — I4891 Unspecified atrial fibrillation: Secondary | ICD-10-CM | POA: Diagnosis not present

## 2016-01-04 NOTE — Patient Outreach (Signed)
[facility]) Care Management  01/04/2016  Ta Jeronimo. [birth year] [id number]   RN spoke with pt's spouse Gokul Arrant today concerning update via transition of care due to pt's [facility]. Spouse reports pt continues to recover well with no major issues with a [place] visit today. Reports pt continues the tele-monitoring with a weight today at 140 lbs with no swelling to any extremities. RN reviewed the HF zones and inquired on pt's and spouse's knowledge based,. Verified pt remains in the GREEN zone and aware when to consult or contact his provider with acute symptoms.  Denies any signs or symptoms of acute issues with no swelling and no problems with his breathing. States pt continues to take all prescribed medications and attend all scheduled medical appointments with no delays. RN continues to review the plan of care and encouraged adherence with his daily care and monitoring of his COPD/HF. Continues to extend home visits if need  Raina Mina, RN Care Management Coordinator [place] [phone] however caregiver remains receptive to ongoing transition of care calls. Will scheduled another follow up next weeks as allowed.

## 2016-01-05 ENCOUNTER — Ambulatory Visit: Payer: Medicare Other | Admitting: Internal Medicine

## 2016-01-05 DIAGNOSIS — E871 Hypo-osmolality and hyponatremia: Secondary | ICD-10-CM | POA: Diagnosis not present

## 2016-01-06 ENCOUNTER — Ambulatory Visit (INDEPENDENT_AMBULATORY_CARE_PROVIDER_SITE_OTHER)
Admission: RE | Admit: 2016-01-06 | Discharge: 2016-01-06 | Disposition: A | Discharge status: Home / Self Care | Payer: Medicare Other | Source: Ambulatory Visit | Attending: Internal Medicine | Admitting: Internal Medicine

## 2016-01-06 ENCOUNTER — Ambulatory Visit (INDEPENDENT_AMBULATORY_CARE_PROVIDER_SITE_OTHER): Payer: Medicare Other | Admitting: Internal Medicine

## 2016-01-06 ENCOUNTER — Encounter: Payer: Self-pay | Admitting: Internal Medicine

## 2016-01-06 VITALS — BP 140/70 | HR 78 | Ht 62.0 in | Wt 133.0 lb

## 2016-01-06 DIAGNOSIS — Z23 Encounter for immunization: Secondary | ICD-10-CM

## 2016-01-06 DIAGNOSIS — R918 Other nonspecific abnormal finding of lung field: Secondary | ICD-10-CM | POA: Diagnosis not present

## 2016-01-06 NOTE — Progress Notes (Signed)
Patient ID: [name], male    DOB: September 21, 1930, 82 y.o.   MRN: [id number]  HPI 11/16/10- 61 year old man followed for severe chronic asthma, sleep apnea, allergic rhinitis, complicated by HBP CAD history of atrial fibrillation, GERD   05/07/2015-82 year old male former smoker Pred's is 120 pack year) followed for severe chronic asthma/COPD, OSA/CPAP, allergic rhinitis, complicated by HBP, CAD/CABG/stent, history A. fib/pacemaker, GERD, recurrent aspiration pneumonia Allergy vaccine 1:10 GO    CPAP 12/ [facility] FOLLOWS FOR: Pt has been on abx's and prednisone several times for about 1 month.  Still on avelox, just finished prednisone today Bronchitis after Christmas-started antibiotic and prednisone then changed Avelox 3 weeks ago with 3 more days to go. He has felt very clear for days, but every day when he takes Avelox he feels bad for an hour or 2-malaise. CXR 04/13/2015- I reviewed images and note follow-up chest x-ray is already pending IMPRESSION: Patchy interstitial infiltrate versus subsegmental atelectasis at both lung bases. Followup PA and lateral chest X-ray is recommended in 3-4 weeks following trial of antibiotic therapy to ensure resolution and exclude underlying malignancy. Electronically Signed  By: David Martinique M.D.  On: 04/13/2015 16:02  01/06/2016-82 year old male former smoker Pred's is 120 pack year) followed for severe chronic asthma/COPD, OSA/CPAP, allergic rhinitis, complicated by HBP, CAD/CABG/stent, history A. fib/pacemaker, GERD, recurrent aspiration pneumonia Allergy vaccine 1:10 GO    CPAP 12/ [facility] LOV- NP- 12/21/15- follow-up afterHCAP, for f/u cxr w nipple markers FOLLOWS FOR: Surgery Clearance for hernia repair. Pt states slight wheezing most of the time.    CXR 12/21/2015 -IMPRESSION: There is no acute infiltrate or pulmonary edema. There is improvement in aeration in right lower lobe. There is a nodular density in left base measures 1.4 cm.  This may represent nipple shadow. Repeat frontal view of the chest with nipple markers is recommended for confirmation and to exclude a lung nodule. Electronically Signed   By: Lahoma Crocker M.D.   On: 12/21/2015 16:44 CXR 12/29/2015- IMPRESSION: No edema or consolidation. The nodular areas questioned on recent chest radiograph represent nipple shadows. There is aortic Atherosclerosis. Electronically Signed   By: Lowella Grip III M.D.   On: 01/06/2016 15:21     ROS-see HPI Constitutional:   No-   weight loss, night sweats, fevers, chills, fatigue, lassitude. HEENT:   No-  headaches, difficulty swallowing, tooth/dental problems, sore throat,       No-  sneezing, itching, ear ache, nasal congestion, post nasal drip,  CV:  No-   chest pain, orthopnea, PND, swelling in lower extremities, anasarca, dizziness, palpitations Resp: +shortness of breath with exertion or at rest.              No-   productive cough,  + non-productive cough,  No- coughing up of blood.              No-change in color of mucus.  No- wheezing.   Skin: No-   rash or lesions. GI:  No-   heartburn, indigestion, abdominal pain, nausea, vomiting,  GU:  MS:  No-   joint pain or swelling.   Neuro-     nothing unusual Psych:  No- change in mood or affect. No depression or anxiety.  No memory loss.  Objective:  OBJ- Physical Exam General- Alert, Oriented, Affect-appropriate, Distress- none acute Skin- + staining on forearms from old ecchymoses Lymphadenopathy- none Head- atraumatic            Eyes- Gross vision intact, PERRLA,  conjunctivae and secretions clear            Ears- + Hearing aid            Nose- Clear, no-Septal dev, mucus, polyps, erosion, perforation             Throat- Mallampati III , mucosa clear , drainage- none, tonsils- atrophic Neck- flexible , trachea midline, no stridor , thyroid nl, carotid no bruit Chest - symmetrical excursion , unlabored           Heart/CV- RRR ,murmur+2/6 syst , no  gallop  , no rub, nl s1 s2                           - JVD- none , edema-none, stasis changes- none, varices- none           Lung- +Clear/distant, wheeze- none, unlabored, cough- none , dullness-none, rub- none           Chest wall- Hyperinflated barrel chest Abd-  Br/ Gen/ Rectal- Not done, not indicated Extrem- cyanosis- none, clubbing, none, atrophy- none, strength- nl Neuro- grossly intact to observation. No tremor

## 2016-01-06 NOTE — Patient Instructions (Addendum)
We can continue present meds  Ok from a pulmonary standpoint to go ahead with the planned hernia surgery  Flu vax  Please call as needed

## 2016-01-07 ENCOUNTER — Telehealth: Payer: Self-pay | Admitting: Cardiology

## 2016-01-07 DIAGNOSIS — I130 Hypertensive heart and chronic kidney disease with heart failure and stage 1 through stage 4 chronic kidney disease, or unspecified chronic kidney disease: Secondary | ICD-10-CM | POA: Diagnosis not present

## 2016-01-07 DIAGNOSIS — Z8719 Personal history of other diseases of the digestive system: Secondary | ICD-10-CM | POA: Diagnosis not present

## 2016-01-07 DIAGNOSIS — J449 Chronic obstructive pulmonary disease, unspecified: Secondary | ICD-10-CM | POA: Diagnosis not present

## 2016-01-07 DIAGNOSIS — I2510 Atherosclerotic heart disease of native coronary artery without angina pectoris: Secondary | ICD-10-CM | POA: Diagnosis not present

## 2016-01-07 DIAGNOSIS — N182 Chronic kidney disease, stage 2 (mild): Secondary | ICD-10-CM | POA: Diagnosis not present

## 2016-01-07 DIAGNOSIS — I739 Peripheral vascular disease, unspecified: Secondary | ICD-10-CM | POA: Diagnosis not present

## 2016-01-07 DIAGNOSIS — Z7901 Long term (current) use of anticoagulants: Secondary | ICD-10-CM | POA: Diagnosis not present

## 2016-01-07 DIAGNOSIS — Z951 Presence of aortocoronary bypass graft: Secondary | ICD-10-CM | POA: Diagnosis not present

## 2016-01-07 DIAGNOSIS — E871 Hypo-osmolality and hyponatremia: Secondary | ICD-10-CM | POA: Diagnosis not present

## 2016-01-07 DIAGNOSIS — I4891 Unspecified atrial fibrillation: Secondary | ICD-10-CM | POA: Diagnosis not present

## 2016-01-07 DIAGNOSIS — I5032 Chronic diastolic (congestive) heart failure: Secondary | ICD-10-CM | POA: Diagnosis not present

## 2016-01-07 DIAGNOSIS — G4733 Obstructive sleep apnea (adult) (pediatric): Secondary | ICD-10-CM | POA: Diagnosis not present

## 2016-01-07 DIAGNOSIS — K219 Gastro-esophageal reflux disease without esophagitis: Secondary | ICD-10-CM | POA: Diagnosis not present

## 2016-01-07 DIAGNOSIS — J45909 Unspecified asthma, uncomplicated: Secondary | ICD-10-CM | POA: Diagnosis not present

## 2016-01-07 NOTE — Telephone Encounter (Signed)
New message    Request for surgical clearance:  1. What type of surgery is being performed? Hernia repairer   When is this surgery scheduled? Pending   2. Are there any medications that need to be held prior to surgery and how long? Clarify - eliquis & Plavix   3. Name of physician performing surgery? Dr. Johney Maine   4. What is your office phone and fax number? [phone] / fax fax [phone]

## 2016-01-08 NOTE — Telephone Encounter (Signed)
[name],  I have answered this question and we have sent clearance.  Please refer to previous notes.

## 2016-01-10 ENCOUNTER — Other Ambulatory Visit: Payer: Self-pay | Admitting: *Deleted

## 2016-01-10 NOTE — Patient Outreach (Signed)
[facility]) Care Management  01/10/2016  Zamier Messerli. Sep 16, 1930 [id number]   RN spoke with pt's spouse Rod Holler) who confirmed pt continues to do well with his recovery. States his weights were today 138 lb, yesterday 140 lbs and last week 140 lbs with no issues reported of SOB and no swelling. Wife continues to report pt is building his strength and working on his balance. States pt coughing episode has improved and pt continues to recover well with no other issues presented related to his COPD/HF or asthma.  Wife mentioned pt was cleared by his pulmonologist however did not know what to do to proceed with the plan for pt to undergo surgery for his hernia. RN encouraged caregiver to contact the provider (surgeron) with an update.   RN offered once again community home visit however only receptive to ongoing telephone follow up. Will follow up with another telephone call for transition of care next week (last contact).  Raina Mina, RN Care Management Coordinator [place] Office [phone]

## 2016-01-13 ENCOUNTER — Ambulatory Visit (INDEPENDENT_AMBULATORY_CARE_PROVIDER_SITE_OTHER): Payer: Medicare Other | Admitting: Nurse Practitioner

## 2016-01-13 ENCOUNTER — Other Ambulatory Visit (INDEPENDENT_AMBULATORY_CARE_PROVIDER_SITE_OTHER): Payer: Medicare Other

## 2016-01-13 ENCOUNTER — Encounter: Payer: Self-pay | Admitting: Nurse Practitioner

## 2016-01-13 VITALS — BP 154/64 | Temp 97.6°F | Ht 62.0 in | Wt 146.0 lb

## 2016-01-13 DIAGNOSIS — R531 Weakness: Secondary | ICD-10-CM | POA: Diagnosis not present

## 2016-01-13 DIAGNOSIS — R2681 Unsteadiness on feet: Secondary | ICD-10-CM | POA: Diagnosis not present

## 2016-01-13 DIAGNOSIS — E871 Hypo-osmolality and hyponatremia: Secondary | ICD-10-CM

## 2016-01-13 LAB — URINALYSIS, ROUTINE W REFLEX MICROSCOPIC
BILIRUBIN URINE: NEGATIVE
KETONES UR: NEGATIVE
LEUKOCYTES UA: NEGATIVE
NITRITE: NEGATIVE
PH: 5.5 (ref 5.0–8.0)
Specific Gravity, Urine: <=1.005 — AB (ref 1.000–1.030)
TOTAL PROTEIN, URINE-UPE24: NEGATIVE
Urine Glucose: NEGATIVE
Urobilinogen, UA: 0.2 (ref 0.0–1.0)

## 2016-01-13 LAB — BASIC METABOLIC PANEL
BUN: 12 mg/dL (ref 6–23)
CALCIUM: 8.8 mg/dL (ref 8.4–10.5)
CO2: 29 mEq/L (ref 19–32)
Chloride: 88 mEq/L — ABNORMAL LOW (ref 96–112)
Creatinine, Ser: 0.99 mg/dL (ref 0.40–1.50)
GFR: 76.76 mL/min (ref >60.00)
Glucose, Bld: 101 mg/dL — ABNORMAL HIGH (ref 70–99)
Potassium: 4.3 mEq/L (ref 3.5–5.1)
SODIUM: 122 meq/L — AB (ref 135–145)

## 2016-01-13 MED ORDER — TORSEMIDE 20 MG PO TABS: ORAL_TABLET | ORAL | 2 refills | Status: AC

## 2016-01-13 NOTE — Progress Notes (Signed)
Due to persistent decrease in sodium and chloride levels; patient needs to take torsemide 10mg  every other day. Return to lab in 1 week for repeat BMP. Urine was normal.

## 2016-01-13 NOTE — Patient Instructions (Addendum)
Encourage use of walker instead of cane to provide better stability.  Due to persistent hyponatremia, patient advised to take torsemide every other day. He will need repeat BMP in 1 week.

## 2016-01-13 NOTE — Assessment & Plan Note (Signed)
Due to persistent hyponatremia, patient advised to take torsemide every other day. He will need repeat BMP in 1 week.

## 2016-01-13 NOTE — Progress Notes (Signed)
Pre visit review using our clinic review tool, if applicable. No additional management support is needed unless otherwise documented below in the visit note. 

## 2016-01-13 NOTE — Progress Notes (Signed)
Subjective:  Patient ID: Stephen Door., male    DOB: 1930/11/16  Age: 82 y.o. MRN: [id number]  CC: Extremity Weakness (Patient is concerned about persistent weakness, will like sodium level checked)   HPI [name] in office today with wife. He is concerned about persistent generalized weakness and unsteady gait. He states he is unable to resume physical therapy due to weakness and unsteady gait. He denies any pain, no change in GI or GU function, no change in appetite, no change in mental status. He has upcoming hernia surgery and he would like a repeat BMP and urine test. He currently ambulates with a cane and will like a walker for better stability.  Outpatient Medications Prior to Visit  Medication Sig Dispense Refill  . albuterol (PROAIR HFA) 108 (90 Base) MCG/ACT inhaler Inhale 2 puffs into the lungs every 6 (six) hours as needed for wheezing or shortness of breath. 1 Inhaler 12  . apixaban (ELIQUIS) 5 MG TABS tablet Take 1 tablet (5 mg total) by mouth 2 (two) times daily. 60 tablet 11  . bisoprolol (ZEBETA) 10 MG tablet Take 2 tablets (20 mg total) by mouth daily. 60 tablet 5  . clopidogrel (PLAVIX) 75 MG tablet Take 1 tablet (75 mg total) by mouth daily with breakfast. 30 tablet 8  . dexlansoprazole (DEXILANT) 60 MG capsule Take 1 capsule (60 mg total) by mouth daily. 30 capsule 3  . isosorbide mononitrate (IMDUR) 60 MG 24 hr tablet Take 1 tablet (60 mg total) by mouth 2 (two) times daily. 180 tablet 0  . latanoprost (XALATAN) 0.005 % ophthalmic solution Place 1 drop into both eyes at bedtime.     Marland Kitchen LORazepam (ATIVAN) 0.5 MG tablet Take 0.5 mg by mouth 2 (two) times daily.    . mometasone-formoterol (DULERA) 100-5 MCG/ACT AERO Inhale 2 puffs into the lungs 2 (two) times daily. Rinse mouth after use    . montelukast (SINGULAIR) 10 MG tablet take 1 tablet by mouth at bedtime 90 tablet 3  . nitroGLYCERIN (NITROSTAT) 0.4 MG SL tablet Place 1 tablet (0.4 mg total) under the tongue  every 5 (five) minutes as needed for chest pain. 25 tablet 0  . pravastatin (PRAVACHOL) 80 MG tablet Take 1 tablet (80 mg total) by mouth every morning. 90 tablet 3  . Probiotic Product (PROBIOTIC DAILY) CAPS Take 1 capsule by mouth 2 (two) times daily.     Marland Kitchen RA VITAMIN B-12 TR 1000 MCG TBCR take 1 tablet by mouth once daily 30 tablet 11  . traMADol (ULTRAM) 50 MG tablet Take 1 tablet (50 mg total) by mouth every 8 (eight) hours as needed. 21 tablet 0  . valsartan (DIOVAN) 320 MG tablet Take 1 tablet (320 mg total) by mouth daily. 90 tablet 3  . acetaminophen (TYLENOL) 325 MG tablet Take 650 mg by mouth every 6 (six) hours as needed for moderate pain or headache.     . calcium-vitamin D (OSCAL WITH D) 500-200 MG-UNIT per tablet Take 1 tablet by mouth 2 (two) times daily.    . cholestyramine (QUESTRAN) 4 g packet Take 2 packets (8 g total) by mouth daily after lunch. 60 each 12  . ipratropium-albuterol (DUONEB) 0.5-2.5 (3) MG/3ML SOLN USE ONE VIAL VIA NEBULIZER EVERY 6 HOURS AS NEEDED FOR SHORTNESS OF BREATH 360 mL 5  . loperamide (IMODIUM A-D) 2 MG tablet Take 1 tablet (2 mg total) by mouth 4 (four) times daily as needed for diarrhea or loose stools. 30 tablet  1  . PRESCRIPTION MEDICATION Inhale into the lungs at bedtime. CPAP/ 12    . PRESCRIPTION MEDICATION Inject 1 each into the skin once a week. Allergy shots from Dr. Annamaria Boots (1:10) - self-administered - usually Thursday or Friday    . ranitidine (ZANTAC) 150 MG tablet Take 1 tablet (150 mg total) by mouth 2 (two) times daily. 60 tablet 3  . spironolactone (ALDACTONE) 25 MG tablet Take 0.5 tablets (12.5 mg total) by mouth daily. 30 tablet 2  . torsemide (DEMADEX) 20 MG tablet Take 0.5 tablets (10 mg total) by mouth daily. 30 tablet 2  . traZODone (DESYREL) 50 MG tablet Take 1 tablet (50 mg total) by mouth at bedtime as needed for sleep. 30 tablet 5   No facility-administered medications prior to visit.     ROS See HPI  Objective:  BP (!)  154/64   Temp 97.6 F (36.4 C)   Ht 5\' 2"  (1.575 m)   Wt 146 lb (66.2 kg)   BMI 26.70 kg/m   BP Readings from Last 3 Encounters:  01/13/16 (!) 154/64  01/06/16 140/70  12/28/15 (!) 154/62    Wt Readings from Last 3 Encounters:  01/13/16 146 lb (66.2 kg)  01/06/16 133 lb (60.3 kg)  12/28/15 144 lb (65.3 kg)    Physical Exam  Constitutional: He is oriented to person, place, and time. No distress.  Neck: Normal range of motion. Neck supple.  Cardiovascular: Normal rate, normal heart sounds and intact distal pulses.   Pulmonary/Chest: Effort normal and breath sounds normal.  Abdominal: Soft. Bowel sounds are normal. There is no tenderness.  Musculoskeletal: He exhibits no edema.  Ambulates with cane. Has difficulty getting out of chair in exam room.   Neurological: He is alert and oriented to person, place, and time.  Skin: Skin is warm and dry.  Psychiatric: He has a normal mood and affect. His behavior is normal.  Vitals reviewed.   Lab Results  Component Value Date   WBC 7.9 12/15/2015   HGB 10.0 (L) 12/15/2015   HCT 30.9 (L) 12/15/2015   PLT 161 12/15/2015   GLUCOSE 101 (H) 01/13/2016   CHOL 128 06/02/2015   TRIG 95.0 06/02/2015   HDL 37.00 (L) 06/02/2015   LDLCALC 72 06/02/2015   ALT 9 12/28/2015   AST 13 12/28/2015   NA 122 (L) 01/13/2016   K 4.3 01/13/2016   CL 88 (L) 01/13/2016   CREATININE 0.99 01/13/2016   BUN 12 01/13/2016   CO2 29 01/13/2016   TSH 1.764 10/17/2015   PSA 0.62 10/10/2010   INR 1.11 05/17/2015   HGBA1C 5.2 12/28/2015    Dg Chest 2 View  Result Date: 01/06/2016 CLINICAL DATA:  Questionable pulmonary nodular lesion on recent chest radiograph EXAM: CHEST  2 VIEW COMPARISON:  December 21, 2015 ; November 24, 2014 FINDINGS: Current study was performed with nipple markers in place. The nodular opacities noted on the previous study are consistent with nipple shadows. There is currently no edema or consolidation. Heart size and pulmonary  vascularity are normal. There is atherosclerotic calcification in the aorta. There is a left proximal subclavian stent. Patient is status post coronary artery bypass grafting. There is no evident adenopathy. There is evidence of old rib trauma with remodeling involving the left lateral fifth rib. IMPRESSION: No edema or consolidation. The nodular areas questioned on recent chest radiograph represent nipple shadows. There is aortic atherosclerosis. Electronically Signed   By: Lowella Grip III M.D.   On:  01/06/2016 15:21    Assessment & Plan:   Giordano was seen today for extremity weakness.  Diagnoses and all orders for this visit:  Unsteadiness -     Basic Metabolic Panel (BMET); Future -     Urinalysis, Routine w reflex microscopic; Future -     Cancel: For home use only DME Walker -     For home use only DME Walker rolling  Hyponatremia -     Basic Metabolic Panel (BMET); Future -     Basic Metabolic Panel (BMET); Future  Generalized weakness -     Cancel: For home use only DME Walker -     For home use only DME Walker rolling  Other orders -     torsemide (DEMADEX) 20 MG tablet; Take half tablet every other day.   I have discontinued Mr. Winberry calcium-vitamin D, ipratropium-albuterol, acetaminophen, PRESCRIPTION MEDICATION, PRESCRIPTION MEDICATION, traZODone, ranitidine, spironolactone, cholestyramine, and loperamide. I have also changed his torsemide. Additionally, I am having him maintain his latanoprost, RA VITAMIN B-12 TR, nitroGLYCERIN, PROBIOTIC DAILY, isosorbide mononitrate, apixaban, valsartan, albuterol, clopidogrel, LORazepam, mometasone-formoterol, bisoprolol, dexlansoprazole, pravastatin, montelukast, and traMADol.  Meds ordered this encounter  Medications  . torsemide (DEMADEX) 20 MG tablet    Sig: Take half tablet every other day.    Dispense:  30 tablet    Refill:  2    Order Specific Question:   Supervising Provider    Answer:   Cassandria Anger [[id number]]      Follow-up: Return if symptoms worsen or fail to improve.  Wilfred Lacy, NP

## 2016-01-17 ENCOUNTER — Other Ambulatory Visit: Payer: Self-pay | Admitting: *Deleted

## 2016-01-17 NOTE — Patient Outreach (Signed)
[facility]) Care Management  01/17/2016  [name]. 1930-04-30 [id number]   RN attempted outreach call today however unsuccessful. Will outreach once again tomorrow to inquire on pt's progress in managing his ongoing care. Will reschedule follow call for tomorrow.  Raina Mina, RN Care Management Coordinator [place] Office [phone]

## 2016-01-18 ENCOUNTER — Other Ambulatory Visit: Payer: Self-pay | Admitting: *Deleted

## 2016-01-18 ENCOUNTER — Other Ambulatory Visit: Payer: Self-pay | Admitting: Internal Medicine

## 2016-01-18 NOTE — Telephone Encounter (Signed)
RX faxed to [facility] 

## 2016-01-18 NOTE — Patient Outreach (Signed)
[facility]) Care Management  01/18/2016  Montravious Weigelt. 12-Aug-1930 [id number]   RN spoke with the primary caregiver (consented wife). Inquired on pt's ongoing management of care and discussed pt's plan of care with all goals that have been met as of today (last transition  of care contact).  Pt continues to weigh daily with no acute symptoms reported. All appointments have been with good results and pt has been taking all medications as prescribed with no delays. Pt has met once again all goals as he remains in the GREEN zone with no symptoms reported on this call today. Pt and caregiver are aware of when to contact his provider with any abnormal symptoms or distressful breathing. All agencies have completed their referral and pt is managing his care more independently with the assistance of his wife when needed. RN offered once again if any home visits are needed (decline) and offered the opportunity for pt to have an ongoing health coach to assist further with his management of care (declined).   Based upon pt's progress over the last month will discharged via [facility] services with no additional needs to address at this time. Case will be closed and Dr. Quay Burow will be notified accordingly.  Raina Mina, RN Care Management Coordinator [place] Office [phone]

## 2016-01-19 ENCOUNTER — Encounter: Payer: Self-pay | Admitting: *Deleted

## 2016-01-27 DIAGNOSIS — L570 Actinic keratosis: Secondary | ICD-10-CM | POA: Diagnosis not present

## 2016-01-27 DIAGNOSIS — L821 Other seborrheic keratosis: Secondary | ICD-10-CM | POA: Diagnosis not present

## 2016-01-31 ENCOUNTER — Other Ambulatory Visit: Payer: Self-pay | Admitting: Internal Medicine

## 2016-01-31 NOTE — Telephone Encounter (Signed)
CY Please advise on refill; I do not see Duoneb on patient's med list. Thanks.

## 2016-01-31 NOTE — Telephone Encounter (Signed)
[facility] # [phone],  1 neb every 6 hours, ref x 12

## 2016-02-02 ENCOUNTER — Other Ambulatory Visit: Payer: Self-pay | Admitting: Internal Medicine

## 2016-02-02 NOTE — Telephone Encounter (Signed)
RX faxed to [facility] 

## 2016-02-07 ENCOUNTER — Encounter: Payer: Self-pay | Admitting: Internal Medicine

## 2016-02-07 ENCOUNTER — Ambulatory Visit (INDEPENDENT_AMBULATORY_CARE_PROVIDER_SITE_OTHER): Payer: Medicare Other | Admitting: Internal Medicine

## 2016-02-07 VITALS — BP 148/78 | HR 76 | Ht 62.0 in | Wt 143.5 lb

## 2016-02-07 DIAGNOSIS — R197 Diarrhea, unspecified: Secondary | ICD-10-CM

## 2016-02-07 NOTE — Patient Instructions (Signed)
Please follow up as needed 

## 2016-02-07 NOTE — Progress Notes (Signed)
HISTORY OF PRESENT ILLNESS:  [name]. is a 82 y.o. male with multiple significant medical problems who presents today for follow-up regarding diarrhea. The patient is accompanied by his wife. He has had problems with chronic diarrhea for which Questran has helped. Recent hospitalization for pneumonia and hyponatremia. Also evaluated regarding dysphagia. Tells me that problems with diarrhea resolved approximately 3 weeks ago when he had a number of his medications discontinued. No active or acute GI problems. He continues to use Questran.  REVIEW OF SYSTEMS:  All non-GI ROS negative except for headaches, hearing problems, heart murmur, confusion, fatigue, skin rash,  Past Medical History:  Diagnosis Date  . Arthritis   . ASTHMA   . Benign liver cyst   . CAD (coronary artery disease)    a. s/p CABG;  b. cath 06/27/10: S-Dx occluded, S-PDA 80-90% (tx with PCI); S-OM ok, L-LAD ok;  EF 65-70%  c.  s/p Promus DES to S-PDA 06/2010;   d. Cath 8 2016 LIMA to the LAD patent, SVG to posterior lateral subtotal, SVG to diagonal occluded chronically. The patient had stenting of the native right vessel.  . Cataract    surgery to both eyes  . Chronic diastolic CHF (congestive heart failure), NYHA class 2 ([place])   . CKD (chronic kidney disease), stage II   . COPD (chronic obstructive pulmonary disease) ([place])   . Depression with anxiety   . Diverticulosis   . DYSLIPIDEMIA   . Esophageal stricture   . Essential hypertension    Echo 3/12: EF 55-60%; mod LVH; mild AS/AI; LAE; PASP 38; mild pulmo HTN  . GERD    with HH, hx esophageal stricture  . Glaucoma   . Hiatal hernia   . Osteoporosis   . PAF (paroxysmal atrial fibrillation) ([place])   . Personal history of alcoholism ([place])    quit etoh and abstinent since age 71.   Marland Kitchen Pneumonia 04/2013 hosp  . Skin cancer    L forearm  . SLEEP APNEA 09/2001   NPSG AHI 22/HR  . Subclavian arterial stenosis ([facility])    a. s/p PTCA of subclavian 2016.    Past  Surgical History:  Procedure Laterality Date  . CARDIAC CATHETERIZATION N/A 11/26/2014   Procedure: Left Heart Cath and Cors/Grafts Angiography;  Surgeon: Sherren Mocha, MD;  Location: [place] [facility];  Service: Cardiovascular;  Laterality: N/A;  . CARDIAC CATHETERIZATION N/A 11/26/2014   Procedure: Coronary Stent Intervention;  Surgeon: Sherren Mocha, MD;  Location: [place] [facility];  Service: Cardiovascular;  Laterality: N/A;  . CATARACT EXTRACTION, BILATERAL  2007  . CHOLECYSTECTOMY  02/21/2011   Procedure: LAPAROSCOPIC CHOLECYSTECTOMY WITH INTRAOPERATIVE CHOLANGIOGRAM;  Surgeon: Pedro Earls, MD;  Location: [facility];  Service: General;  Laterality: N/A;  . CORONARY ANGIOPLASTY WITH STENT PLACEMENT  07/2010  . CORONARY ARTERY BYPASS GRAFT  02/2007  . FLEXIBLE SIGMOIDOSCOPY N/A 11/05/2015   Procedure: FLEXIBLE SIGMOIDOSCOPY;  Surgeon: Jerene Bears, MD;  Location[facility];  Service: Endoscopy;  Laterality: N/A;  . HERNIA REPAIR  unsure ?60's  . PERIPHERAL VASCULAR CATHETERIZATION N/A 02/22/2015   Procedure: Upper Extremity Angiography;  Surgeon: Lorretta Harp, MD;  Location: [place] [facility];  Service: Cardiovascular;  Laterality: N/A;  . TONSILLECTOMY      Social History [name]  reports that he quit smoking about 36 years ago. His smoking use included Cigarettes. He has a 120.00 pack-year smoking history. He has never used smokeless tobacco. He reports  that he does not drink alcohol or use drugs.  family history includes Asthma in his sister; COPD in his sister; Emphysema in his sister; Heart disease in his brother, father, and mother; Hyperlipidemia in his father, mother, and sister; Hypertension in his father, mother, and sister.  Allergies  Allergen Reactions  . Ace Inhibitors Other (See Comments)    Severe asthma (COPD)  . Avelox [Moxifloxacin Hcl In Nacl] Other (See Comments)    Gum pain  . Tape Other (See Comments)    Adhesive and tape reaction-tears  skin off   . Lidocaine Other (See Comments)    Possible reaction? Dizziness, confusion, syncope  . Morphine And Related Nausea And Vomiting  . Codeine Nausea And Vomiting  . Lasix [Furosemide] Itching and Swelling  . Other Other (See Comments)    Maple trees, allergy symptoms       PHYSICAL EXAMINATION: Vital signs: BP (!) 148/78   Pulse 76   Ht 5\' 2"  (1.575 m)   Wt 143 lb 8 oz (65.1 kg)   BMI 26.25 kg/m   Constitutional: Elderly, chronically ill-appearing, pleasant, no acute distress Psychiatric: alert and oriented x3, cooperative Eyes: extraocular movements intact, anicteric, conjunctiva pink Mouth: oral pharynx moist, no lesions Neck: supple no lymphadenopathy Cardiovascular: heart regular rate and rhythm, systolic murmur Lungs: clear to auscultation bilaterally Abdomen: soft, nontender, nondistended, no obvious ascites, no peritoneal signs, normal bowel sounds, no organomegaly Rectal: Omitted Extremities: no clubbing, cyanosis, or lower extremity edema bilaterally. Some stasis changes Skin: no lesions on visible extremities except for some stasis changes Neuro: No focal deficits. Cranial nerves intact  ASSESSMENT:  #1. Recent problems with diarrhea. Previous chronic diarrhea issues responded nicely to Questran. Suspect recent problems antibiotic-related, without Clostridium difficile. Possibly medication related. Any event, has been doing well over the past 3 weeks  PLAN:   #1. Continue Questran. Reviewed proper way to take medication 2 hours away from other medications  #2. GI follow-up as needed  15 minutes was spent face-to-face with the patient. Greater than 50% a time use for counseling regarding management of his chronic diarrhea

## 2016-02-09 ENCOUNTER — Other Ambulatory Visit (INDEPENDENT_AMBULATORY_CARE_PROVIDER_SITE_OTHER): Payer: Medicare Other

## 2016-02-09 ENCOUNTER — Ambulatory Visit (INDEPENDENT_AMBULATORY_CARE_PROVIDER_SITE_OTHER)
Admission: RE | Admit: 2016-02-09 | Discharge: 2016-02-09 | Disposition: A | Discharge status: Home / Self Care | Payer: Medicare Other | Source: Ambulatory Visit | Attending: Family | Admitting: Family

## 2016-02-09 ENCOUNTER — Ambulatory Visit (INDEPENDENT_AMBULATORY_CARE_PROVIDER_SITE_OTHER): Payer: Medicare Other | Admitting: Family

## 2016-02-09 ENCOUNTER — Ambulatory Visit: Payer: Medicare Other | Admitting: Internal Medicine

## 2016-02-09 VITALS — BP 154/72 | HR 62 | Temp 97.5°F | Resp 16 | Ht 62.0 in | Wt 143.0 lb

## 2016-02-09 DIAGNOSIS — R410 Disorientation, unspecified: Secondary | ICD-10-CM

## 2016-02-09 LAB — CBC WITH DIFFERENTIAL/PLATELET
BASOS ABS: 0.1 10*3/uL (ref 0.0–0.1)
Basophils Relative: 1 % (ref 0.0–3.0)
Eosinophils Absolute: 0.2 10*3/uL (ref 0.0–0.7)
Eosinophils Relative: 2.3 % (ref 0.0–5.0)
HCT: 37.3 % — ABNORMAL LOW (ref 39.0–52.0)
Hemoglobin: 12.6 g/dL — ABNORMAL LOW (ref 13.0–17.0)
LYMPHS ABS: 2 10*3/uL (ref 0.7–4.0)
Lymphocytes Relative: 27.5 % (ref 12.0–46.0)
MCHC: 33.8 g/dL (ref 30.0–36.0)
MCV: 87.8 fl (ref 78.0–100.0)
MONO ABS: 0.7 10*3/uL (ref 0.1–1.0)
MONOS PCT: 9.9 % (ref 3.0–12.0)
NEUTROS ABS: 4.2 10*3/uL (ref 1.4–7.7)
NEUTROS PCT: 59.3 % (ref 43.0–77.0)
PLATELETS: 246 10*3/uL (ref 150.0–400.0)
RBC: 4.25 Mil/uL (ref 4.22–5.81)
RDW: 14.7 % (ref 11.5–15.5)
WBC: 7.1 10*3/uL (ref 4.0–10.5)

## 2016-02-09 LAB — BASIC METABOLIC PANEL
BUN: 15 mg/dL (ref 6–23)
CALCIUM: 9.5 mg/dL (ref 8.4–10.5)
CHLORIDE: 95 meq/L — AB (ref 96–112)
CO2: 29 mEq/L (ref 19–32)
CREATININE: 0.94 mg/dL (ref 0.40–1.50)
GFR: 81.47 mL/min (ref >60.00)
Glucose, Bld: 103 mg/dL — ABNORMAL HIGH (ref 70–99)
Potassium: 3.9 mEq/L (ref 3.5–5.1)
SODIUM: 132 meq/L — AB (ref 135–145)

## 2016-02-09 LAB — URINALYSIS
Bilirubin Urine: NEGATIVE
KETONES UR: NEGATIVE
LEUKOCYTES UA: NEGATIVE
Nitrite: NEGATIVE
Specific Gravity, Urine: <=1.005 — AB (ref 1.000–1.030)
Total Protein, Urine: NEGATIVE
URINE GLUCOSE: NEGATIVE
UROBILINOGEN UA: 0.2 (ref 0.0–1.0)
pH: 6 (ref 5.0–8.0)

## 2016-02-09 NOTE — Patient Instructions (Signed)
Thank you for choosing [facility].  SUMMARY AND INSTRUCTIONS:  We will check your blood work and get an x-ray to ensure there is no underlying infection.   Medication:  Please continue to take your medications as prescribed.   Your prescription(s) have been submitted to your pharmacy or been printed and provided for you. Please take as directed and contact our office if you believe you are having problem(s) with the medication(s) or have any questions.   Follow up:  If your symptoms worsen or fail to improve, please contact our office for further instruction, or in case of emergency go directly to the emergency room at the closest medical facility.

## 2016-02-09 NOTE — Assessment & Plan Note (Addendum)
Transient and intermittent confusion resolved without further episodes. Recent history of hyonatremia and diarrhea. Does not appear to be delirium. Obtain CBC w/diff, BMET, and chest x-ray. Patient unable to provide urine sample during visit. Unlikely TIA given anticoagulation with Eliquis. Continue to monitor and follow up pending lab working and imaging.

## 2016-02-09 NOTE — Progress Notes (Signed)
Subjective:    Patient ID: Stephen Door., male    DOB: May 15, 1930, 82 y.o.   MRN: [id number]  Chief Complaint  Patient presents with  . Altered Mental Status    yesterday had an instance where he was confused put 2 diff shoes on his feet    HPI:  [name]. is a 82 y.o. male who  has a past medical history of Arthritis; ASTHMA; Benign liver cyst; CAD (coronary artery disease); Cataract; Chronic diastolic CHF (congestive heart failure), NYHA class 2 (HCC); CKD (chronic kidney disease), stage II; COPD (chronic obstructive pulmonary disease) ([place]); Depression with anxiety; Diverticulosis; DYSLIPIDEMIA; Esophageal stricture; Essential hypertension; GERD; Glaucoma; Hiatal hernia; Osteoporosis; PAF (paroxysmal atrial fibrillation) ([place]); Personal history of alcoholism ([place]); Pneumonia (04/2013 hosp); Skin cancer; SLEEP APNEA (09/2001); and Subclavian arterial stenosis ([place]). and presents today for an office visit.  Associated symptom of transient confusion that occurred yesterday and describes that he was observed placing the shoes on the wrong feet. Wife noted there was confusion for several hours which have since resolved. No fevers. At night he feels like he is "coming down with something."  Associated symptom of cough, congestion and lethary. Has taken breathing treatments which does not help very much.   Allergies  Allergen Reactions  . Ace Inhibitors Other (See Comments)    Severe asthma (COPD)  . Avelox [Moxifloxacin Hcl In Nacl] Other (See Comments)    Gum pain  . Tape Other (See Comments)    Adhesive and tape reaction-tears skin off   . Lidocaine Other (See Comments)    Possible reaction? Dizziness, confusion, syncope  . Morphine And Related Nausea And Vomiting  . Codeine Nausea And Vomiting  . Lasix [Furosemide] Itching and Swelling  . Other Other (See Comments)    Maple trees, allergy symptoms      Outpatient Medications Prior to Visit  Medication Sig Dispense  Refill  . albuterol (PROAIR HFA) 108 (90 Base) MCG/ACT inhaler Inhale 2 puffs into the lungs every 6 (six) hours as needed for wheezing or shortness of breath. 1 Inhaler 12  . apixaban (ELIQUIS) 5 MG TABS tablet Take 1 tablet (5 mg total) by mouth 2 (two) times daily. 60 tablet 11  . clopidogrel (PLAVIX) 75 MG tablet Take 1 tablet (75 mg total) by mouth daily with breakfast. 30 tablet 8  . dexlansoprazole (DEXILANT) 60 MG capsule Take 1 capsule (60 mg total) by mouth daily. 30 capsule 3  . isosorbide mononitrate (IMDUR) 60 MG 24 hr tablet Take 1 tablet (60 mg total) by mouth 2 (two) times daily. 180 tablet 0  . latanoprost (XALATAN) 0.005 % ophthalmic solution Place 1 drop into both eyes at bedtime.     Marland Kitchen LORazepam (ATIVAN) 0.5 MG tablet Take 0.5 mg by mouth 2 (two) times daily.    . montelukast (SINGULAIR) 10 MG tablet take 1 tablet by mouth at bedtime 90 tablet 3  . Multiple Vitamins-Minerals (CENTRUM SILVER PO) Take by mouth.    . nitroGLYCERIN (NITROSTAT) 0.4 MG SL tablet Place 1 tablet (0.4 mg total) under the tongue every 5 (five) minutes as needed for chest pain. 25 tablet 0  . pravastatin (PRAVACHOL) 80 MG tablet Take 1 tablet (80 mg total) by mouth every morning. 90 tablet 3  . RA VITAMIN B-12 TR 1000 MCG TBCR take 1 tablet by mouth once daily 30 tablet 11  . torsemide (DEMADEX) 20 MG tablet Take half tablet every other day. 30 tablet 2  .  traMADol (ULTRAM) 50 MG tablet take 1 tablet by mouth every 8 hours if needed 21 tablet 0  . valsartan (DIOVAN) 320 MG tablet Take 1 tablet (320 mg total) by mouth daily. 90 tablet 3   No facility-administered medications prior to visit.       Past Surgical History:  Procedure Laterality Date  . CARDIAC CATHETERIZATION N/A 11/26/2014   Procedure: Left Heart Cath and Cors/Grafts Angiography;  Surgeon: Sherren Mocha, MD;  Location: [place] [facility];  Service: Cardiovascular;  Laterality: N/A;  . CARDIAC CATHETERIZATION N/A 11/26/2014   Procedure:  Coronary Stent Intervention;  Surgeon: Sherren Mocha, MD;  Location: [place] [facility];  Service: Cardiovascular;  Laterality: N/A;  . CATARACT EXTRACTION, BILATERAL  2007  . CHOLECYSTECTOMY  02/21/2011   Procedure: LAPAROSCOPIC CHOLECYSTECTOMY WITH INTRAOPERATIVE CHOLANGIOGRAM;  Surgeon: Pedro Earls, MD;  [facility]: [facility];  Service: General;  Laterality: N/A;  . CORONARY ANGIOPLASTY WITH STENT PLACEMENT  07/2010  . CORONARY ARTERY BYPASS GRAFT  02/2007  . FLEXIBLE SIGMOIDOSCOPY N/A 11/05/2015   Procedure: FLEXIBLE SIGMOIDOSCOPY;  Surgeon: Jerene Bears, MD;  Location: [facility];  Service: Endoscopy;  Laterality: N/A;  . HERNIA REPAIR  unsure ?60's  . PERIPHERAL VASCULAR CATHETERIZATION N/A 02/22/2015   Procedure: Upper Extremity Angiography;  Surgeon: Lorretta Harp, MD;  Location: [place] [facility];  Service: Cardiovascular;  Laterality: N/A;  . TONSILLECTOMY        Past Medical History:  Diagnosis Date  . Arthritis   . ASTHMA   . Benign liver cyst   . CAD (coronary artery disease)    a. s/p CABG;  b. cath 06/27/10: S-Dx occluded, S-PDA 80-90% (tx with PCI); S-OM ok, L-LAD ok;  EF 65-70%  c.  s/p Promus DES to S-PDA 06/2010;   d. Cath 8 2016 LIMA to the LAD patent, SVG to posterior lateral subtotal, SVG to diagonal occluded chronically. The patient had stenting of the native right vessel.  . Cataract    surgery to both eyes  . Chronic diastolic CHF (congestive heart failure), NYHA class 2 ([place])   . CKD (chronic kidney disease), stage II   . COPD (chronic obstructive pulmonary disease) ([place])   . Depression with anxiety   . Diverticulosis   . DYSLIPIDEMIA   . Esophageal stricture   . Essential hypertension    Echo 3/12: EF 55-60%; mod LVH; mild AS/AI; LAE; PASP 38; mild pulmo HTN  . GERD    with HH, hx esophageal stricture  . Glaucoma   . Hiatal hernia   . Osteoporosis   . PAF (paroxysmal atrial fibrillation) ([place])   . Personal history of alcoholism ([place])    quit etoh  and abstinent since age 75.   Marland Kitchen Pneumonia 04/2013 hosp  . Skin cancer    L forearm  . SLEEP APNEA 09/2001   NPSG AHI 22/HR  . Subclavian arterial stenosis (HCC)    a. s/p PTCA of subclavian 2016.      Review of Systems  Constitutional: Negative for chills and fever.  Respiratory: Negative for chest tightness and shortness of breath.   Cardiovascular: Negative for chest pain and leg swelling.  Genitourinary: Negative for discharge, dysuria, flank pain, frequency, hematuria, penile pain and urgency.  Neurological: Negative for dizziness, weakness and numbness.  Psychiatric/Behavioral: Positive for confusion.      Objective:    BP (!) 154/72 (BP Location: Left Arm, Patient Position: Sitting, Cuff Size: Normal)   Pulse 62   Temp 97.5  F (36.4 C) (Oral)   Resp 16   Ht 5\' 2"  (1.575 m)   Wt 143 lb (64.9 kg)   SpO2 96%   BMI 26.16 kg/m  Nursing note and vital signs reviewed.  Physical Exam  Constitutional: He is oriented to person, place, and time. He appears well-developed and well-nourished. No distress.  HENT:  Right Ear: Hearing, tympanic membrane, external ear and ear canal normal.  Left Ear: Hearing, tympanic membrane, external ear and ear canal normal.  Nose: Nose normal.  Mouth/Throat: Uvula is midline and oropharynx is clear and moist.  Cardiovascular: Normal rate, regular rhythm and intact distal pulses.  Exam reveals no gallop and no friction rub.   Murmur heard. Pulmonary/Chest: Effort normal and breath sounds normal. He has no wheezes. He has no rales. He exhibits no tenderness.  Neurological: He is alert and oriented to person, place, and time.  Skin: Skin is warm and dry.  Psychiatric: He has a normal mood and affect. His behavior is normal. Judgment and thought content normal.       Assessment & Plan:   Problem List Items Addressed This Visit      Other   Intermittent confusion - Primary    Transient and intermittent confusion resolved without further  episodes. Recent history of hyonatremia and diarrhea. Does not appear to be delirium. Obtain CBC w/diff, BMET, and chest x-ray. Patient unable to provide urine sample during visit. Unlikely TIA given anticoagulation with Eliquis. Continue to monitor and follow up pending lab working and imaging.       Relevant Orders   CBC w/Diff (Completed)   Basic Metabolic Panel (BMET) (Completed)   DG Chest 2 View (Completed)   Urinalysis   Urine culture    Other Visit Diagnoses   None.      I am having Mr. Maish maintain his latanoprost, RA VITAMIN B-12 TR, nitroGLYCERIN, isosorbide mononitrate, apixaban, valsartan, albuterol, clopidogrel, LORazepam, dexlansoprazole, pravastatin, montelukast, torsemide, traMADol, and Multiple Vitamins-Minerals (CENTRUM SILVER PO).   Follow-up: Return if symptoms worsen or fail to improve.  Mauricio Po, FNP

## 2016-02-11 LAB — URINE CULTURE: ORGANISM ID, BACTERIA: NO GROWTH

## 2016-02-14 ENCOUNTER — Other Ambulatory Visit: Payer: Self-pay | Admitting: Internal Medicine

## 2016-02-15 NOTE — Telephone Encounter (Signed)
RX faxed to [facility] 

## 2016-02-20 NOTE — Progress Notes (Signed)
HPI The patient presents for follow up of  CAD, s/p CABG, s/p Promus DES to S-PDA in 06/2010.  He was hospitalized last year with hypertensive urgency and NSTEMI.  He had subtotal stenosis of the vein graft to posterior lateral off the right coronary. He had stenting of the native right coronary.  The patient also was found to have subclavian stenosis.   He was later admitted for PCI of the subclavian.  He did have medication changes to include the addition of spironolactone and increased metoprolol.  He had an ileus postprocedure as well.  He has been hospitalized 5 x this year but not since I last saw him in Sept.  He is going to have surgery to have hernia repair.    He has actually been doing well.  The patient denies any new symptoms such as chest discomfort, neck or arm discomfort. There has been no new shortness of breath, PND or orthopnea. There have been no reported palpitations, presyncope or syncope. His only active complaint is that he has significant groin pain when he is up doing any activity areas  Allergies  Allergen Reactions  . Ace Inhibitors Other (See Comments)    Severe asthma (COPD)  . Avelox [Moxifloxacin Hcl In Nacl] Other (See Comments)    Gum pain  . Tape Other (See Comments)    Adhesive and tape reaction-tears skin off   . Lidocaine Other (See Comments)    Possible reaction? Dizziness, confusion, syncope  . Morphine And Related Nausea And Vomiting  . Codeine Nausea And Vomiting  . Lasix [Furosemide] Itching and Swelling  . Other Other (See Comments)    Maple trees, allergy symptoms    Current Outpatient Prescriptions  Medication Sig Dispense Refill  . albuterol (PROAIR HFA) 108 (90 Base) MCG/ACT inhaler Inhale 2 puffs into the lungs every 6 (six) hours as needed for wheezing or shortness of breath. 1 Inhaler 12  . apixaban (ELIQUIS) 5 MG TABS tablet Take 1 tablet (5 mg total) by mouth 2 (two) times daily. 60 tablet 11  . clopidogrel (PLAVIX) 75 MG tablet Take 1  tablet (75 mg total) by mouth daily with breakfast. 30 tablet 8  . dexlansoprazole (DEXILANT) 60 MG capsule Take 1 capsule (60 mg total) by mouth daily. 30 capsule 3  . isosorbide mononitrate (IMDUR) 60 MG 24 hr tablet Take 1 tablet (60 mg total) by mouth 2 (two) times daily. 180 tablet 0  . latanoprost (XALATAN) 0.005 % ophthalmic solution Place 1 drop into both eyes at bedtime.     Marland Kitchen LORazepam (ATIVAN) 0.5 MG tablet take 1 tablet by mouth three times a day 90 tablet 2  . montelukast (SINGULAIR) 10 MG tablet take 1 tablet by mouth at bedtime 90 tablet 3  . Multiple Vitamins-Minerals (CENTRUM SILVER PO) Take by mouth.    . nitroGLYCERIN (NITROSTAT) 0.4 MG SL tablet Place 1 tablet (0.4 mg total) under the tongue every 5 (five) minutes as needed for chest pain. 25 tablet 0  . pravastatin (PRAVACHOL) 80 MG tablet Take 1 tablet (80 mg total) by mouth every morning. 90 tablet 3  . torsemide (DEMADEX) 20 MG tablet Take half tablet every other day. 30 tablet 2  . traMADol (ULTRAM) 50 MG tablet take 1 tablet by mouth every 8 hours if needed 21 tablet 0  . valsartan (DIOVAN) 320 MG tablet Take 1 tablet (320 mg total) by mouth daily. 90 tablet 3   No current facility-administered medications for this visit.  Past Medical History:  Diagnosis Date  . Arthritis   . ASTHMA   . Benign liver cyst   . CAD (coronary artery disease)    a. s/p CABG;  b. cath 06/27/10: S-Dx occluded, S-PDA 80-90% (tx with PCI); S-OM ok, L-LAD ok;  EF 65-70%  c.  s/p Promus DES to S-PDA 06/2010;   d. Cath 8 2016 LIMA to the LAD patent, SVG to posterior lateral subtotal, SVG to diagonal occluded chronically. The patient had stenting of the native right vessel.  . Cataract    surgery to both eyes  . Chronic diastolic CHF (congestive heart failure), NYHA class 2 ([place])   . CKD (chronic kidney disease), stage II   . COPD (chronic obstructive pulmonary disease) ([place])   . Depression with anxiety   . Diverticulosis   . DYSLIPIDEMIA    . Esophageal stricture   . Essential hypertension    Echo 3/12: EF 55-60%; mod LVH; mild AS/AI; LAE; PASP 38; mild pulmo HTN  . GERD    with HH, hx esophageal stricture  . Glaucoma   . Hiatal hernia   . Osteoporosis   . PAF (paroxysmal atrial fibrillation) ([place])   . Personal history of alcoholism ([place])    quit etoh and abstinent since age 65.   Marland Kitchen Pneumonia 04/2013 hosp  . Skin cancer    L forearm  . SLEEP APNEA 09/2001   NPSG AHI 22/HR  . Subclavian arterial stenosis (HCC)    a. s/p PTCA of subclavian 2016.    Past Surgical History:  Procedure Laterality Date  . CARDIAC CATHETERIZATION N/A 11/26/2014   Procedure: Left Heart Cath and Cors/Grafts Angiography;  Surgeon: Sherren Mocha, MD;  Location: [place] [facility];  Service: Cardiovascular;  Laterality: N/A;  . CARDIAC CATHETERIZATION N/A 11/26/2014   Procedure: Coronary Stent Intervention;  Surgeon: Sherren Mocha, MD;  Location: [place] [facility];  Service: Cardiovascular;  Laterality: N/A;  . CATARACT EXTRACTION, BILATERAL  2007  . CHOLECYSTECTOMY  02/21/2011   Procedure: LAPAROSCOPIC CHOLECYSTECTOMY WITH INTRAOPERATIVE CHOLANGIOGRAM;  Surgeon: Pedro Earls, MD;  Location: [facility];  Service: General;  Laterality: N/A;  . CORONARY ANGIOPLASTY WITH STENT PLACEMENT  07/2010  . CORONARY ARTERY BYPASS GRAFT  02/2007  . FLEXIBLE SIGMOIDOSCOPY N/A 11/05/2015   Procedure: FLEXIBLE SIGMOIDOSCOPY;  Surgeon: Jerene Bears, MD;  Location: [facility] ENDOSCOPY;  Service: Endoscopy;  Laterality: N/A;  . HERNIA REPAIR  unsure ?60's  . PERIPHERAL VASCULAR CATHETERIZATION N/A 02/22/2015   Procedure: Upper Extremity Angiography;  Surgeon: Lorretta Harp, MD;  Location: [place] [facility];  Service: Cardiovascular;  Laterality: N/A;  . TONSILLECTOMY      ROS:     As stated in the HPI and negative for all other systems.  PHYSICAL EXAM BP (!) 180/88 (BP Location: Left Arm, Patient Position: Sitting, Cuff Size: Normal)   Pulse 79   Ht 5\' 2"   (1.575 m)   Wt 145 lb 12.8 oz (66.1 kg)   SpO2 99%   BMI 26.67 kg/m  GENERAL:  Slightly frail appearing but in no acute distress HEENT:  Pupils equal round and reactive, fundi not visualized, oral mucosa unremarkable, dentures NECK:  No jugular venous distention, waveform within normal limits, carotid upstroke brisk and symmetric, left carotid bruits, no thyromegaly LUNGS:  Clear to auscultation percussion bilaterally CHEST:  Well healed sternotomy scar. HEART:  PMI not displaced or sustained,S1 and S2 within normal limits, no S3, no S4, no clicks, no rubs, soft apical, right  upper sterna cadborder early peaking murmur ABD:  Flat, positive bowel sounds normal in frequency in pitch, no bruits, no rebound, no guarding, no midline pulsatile mass, no hepatomegaly, no splenomegaly, healed abdominal scars. EXT:  2 plus pulses throughout, no edema, no cyanosis no clubbing, right bruit.   SKIN:  No nodules, bruising   EKG:  NA  ASSESSMENT AND PLAN  CAD:  The patient has no new sypmtoms.  No change in therapy is indicated.  ATRIAL FIBRILLATION:    He has no paroxysmal symptoms.  He will continue with Eliqus as above.  Mr. Samridh Naba. has a CHA2DS2 - VASc score of 4 with a risk of stroke of 4%.  HTN: His blood pressure is elevated today but this is unusual.  They will keep a BP diary.  He has been sensitive to meds and for now I will not make a change. Marland Kitchen   CAROTID STENOSIS:  In Sept he had stable 40-59% right ICA stenosis and stable 1-39% left ICA stenosis.  I will likely follow this again in two years.    AS:  This was moderate on echo earlier this year. We will follow this clinically.  DYSPNEA:  He did see Dr. Annamaria Boots in Sept and there did not appear to be any new studies or med changes.  I reviewed this note.

## 2016-02-21 ENCOUNTER — Ambulatory Visit (INDEPENDENT_AMBULATORY_CARE_PROVIDER_SITE_OTHER): Payer: Medicare Other | Admitting: Cardiology

## 2016-02-21 ENCOUNTER — Encounter: Payer: Self-pay | Admitting: Cardiology

## 2016-02-21 VITALS — BP 180/88 | HR 79 | Ht 62.0 in | Wt 145.8 lb

## 2016-02-21 DIAGNOSIS — I25700 Atherosclerosis of coronary artery bypass graft(s), unspecified, with unstable angina pectoris: Secondary | ICD-10-CM | POA: Diagnosis not present

## 2016-02-21 DIAGNOSIS — I5032 Chronic diastolic (congestive) heart failure: Secondary | ICD-10-CM | POA: Diagnosis not present

## 2016-02-21 DIAGNOSIS — Z01810 Encounter for preprocedural cardiovascular examination: Secondary | ICD-10-CM | POA: Diagnosis not present

## 2016-02-21 NOTE — Patient Instructions (Signed)
Medication Instructions:  Continue current medications  Labwork: None Ordered  Testing/Procedures: None Ordered  Follow-Up: Your physician wants you to follow-up in: 6 Months. You will receive a reminder letter in the mail two months in advance. If you don't receive a letter, please call our office to schedule the follow-up appointment.   Any Other Special Instructions Will Be Listed Below (If Applicable).        Happy Thanksgiving   If you need a refill on your cardiac medications before your next appointment, please call your pharmacy.

## 2016-02-23 ENCOUNTER — Encounter: Payer: Self-pay | Admitting: Cardiology

## 2016-02-23 NOTE — Patient Instructions (Addendum)
[name]  02/23/2016   Your procedure is scheduled on: Wednesday 03/01/2016  Report to [facility] Main  Entrance take [facility]  elevators to 3rd floor to  [place] at  1000   AM.  Call this number if you have problems the morning of surgery [phone]   Remember: ONLY 1 PERSON MAY GO WITH YOU TO SHORT STAY TO GET  READY MORNING OF [place].   Do not eat food or drink liquids :After Midnight.     Take these medicines the morning of surgery with A SIP OF WATER: Lorazepam (Ativan), Bisoprolol (Zebeta), Isosorbide mononitrate (Imdur), eye drops, use Albuterol inhaler if needed, use Duoneb inhaler if needed              PLEASE BRING CPAP MASK AND TUBING WITH YOU MORNING OF SURGERY!                               You may not have any metal on your body including hair pins and              piercings  Do not wear jewelry, make-up, lotions, powders or perfumes, deodorant             Do not wear nail polish.  Do not shave  48 hours prior to surgery.              Men may shave face and neck.   Do not bring valuables to the hospital. [place].  Contacts, dentures or bridgework may not be worn into surgery.  Leave suitcase in the car. After surgery it may be brought to your room.                  Please read over the following fact sheets you were given: _____________________________________________________________________             [facility] - Preparing for Surgery Before surgery, you can play an important role.  Because skin is not sterile, your skin needs to be as free of germs as possible.  You can reduce the number of germs on your skin by washing with CHG (chlorahexidine gluconate) soap before surgery.  CHG is an antiseptic cleaner which kills germs and bonds with the skin to continue killing germs even after washing. Please DO NOT use if you have an allergy to CHG or antibacterial soaps.   If your skin becomes reddened/irritated stop using the CHG and inform your nurse when you arrive at Short Stay. Do not shave (including legs and underarms) for at least 48 hours prior to the first CHG shower.  You may shave your face/neck. Please follow these instructions carefully:  1.  Shower with CHG Soap the night before surgery and the  morning of Surgery.  2.  If you choose to wash your hair, wash your hair first as usual with your  normal  shampoo.  3.  After you shampoo, rinse your hair and body thoroughly to remove the  shampoo.                           4.  Use CHG as you would any other liquid soap.  You can apply chg directly  to the skin and wash                       Gently with a scrungie or clean washcloth.  5.  Apply the CHG Soap to your body ONLY FROM THE NECK DOWN.   Do not use on face/ open                           Wound or open sores. Avoid contact with eyes, ears mouth and genitals (private parts).                       Wash face,  Genitals (private parts) with your normal soap.             6.  Wash thoroughly, paying special attention to the area where your surgery  will be performed.  7.  Thoroughly rinse your body with warm water from the neck down.  8.  DO NOT shower/wash with your normal soap after using and rinsing off  the CHG Soap.                9.  Pat yourself dry with a clean towel.            10.  Wear clean pajamas.            11.  Place clean sheets on your bed the night of your first shower and do not  sleep with pets. Day of Surgery : Do not apply any lotions/deodorants the morning of surgery.  Please wear clean clothes to the hospital/surgery center.  FAILURE TO FOLLOW THESE INSTRUCTIONS MAY RESULT IN THE CANCELLATION OF YOUR SURGERY PATIENT SIGNATURE_________________________________  NURSE SIGNATURE__________________________________  _

## 2016-02-24 ENCOUNTER — Encounter (HOSPITAL_COMMUNITY): Payer: Self-pay

## 2016-02-24 ENCOUNTER — Encounter (HOSPITAL_COMMUNITY)
Admission: RE | Admit: 2016-02-24 | Discharge: 2016-02-24 | Disposition: A | Discharge status: Home / Self Care | Payer: Medicare Other | Source: Ambulatory Visit | Attending: Surgery | Admitting: Surgery

## 2016-02-24 DIAGNOSIS — K4090 Unilateral inguinal hernia, without obstruction or gangrene, not specified as recurrent: Secondary | ICD-10-CM | POA: Insufficient documentation

## 2016-02-24 DIAGNOSIS — Z01818 Encounter for other preprocedural examination: Secondary | ICD-10-CM | POA: Diagnosis not present

## 2016-02-24 LAB — BASIC METABOLIC PANEL
Anion gap: 7 (ref 5–15)
BUN: 14 mg/dL (ref 6–20)
CALCIUM: 9.1 mg/dL (ref 8.9–10.3)
CHLORIDE: 97 mmol/L — AB (ref 101–111)
CO2: 30 mmol/L (ref 22–32)
CREATININE: 1.01 mg/dL (ref 0.61–1.24)
GFR calc Af Amer: >60 mL/min (ref >60)
GLUCOSE: 97 mg/dL (ref 65–99)
POTASSIUM: 4.1 mmol/L (ref 3.5–5.1)
Sodium: 134 mmol/L — ABNORMAL LOW (ref 135–145)

## 2016-02-24 NOTE — Progress Notes (Signed)
12/13/2015- noted in EPIC-EKG 09/24/2015- noted in EPIC- ECHO.  02/09/2016- noted in EPIC- CXR, labs- CBC w/diff., BMET (abn)

## 2016-03-01 ENCOUNTER — Observation Stay (HOSPITAL_COMMUNITY)
Admission: RE | Admit: 2016-03-01 | Discharge: 2016-03-02 | Disposition: A | Discharge status: Home / Self Care | Payer: Medicare Other | Source: Ambulatory Visit | Attending: Surgery | Admitting: Surgery

## 2016-03-01 ENCOUNTER — Encounter (HOSPITAL_COMMUNITY): Payer: Self-pay | Admitting: *Deleted

## 2016-03-01 ENCOUNTER — Ambulatory Visit (HOSPITAL_COMMUNITY): Payer: Medicare Other | Admitting: Anesthesiology

## 2016-03-01 ENCOUNTER — Encounter (HOSPITAL_COMMUNITY)
Admission: RE | Disposition: A | Discharge status: Home / Self Care | Payer: Self-pay | Source: Ambulatory Visit | Attending: Surgery

## 2016-03-01 DIAGNOSIS — K429 Umbilical hernia without obstruction or gangrene: Secondary | ICD-10-CM | POA: Diagnosis not present

## 2016-03-01 DIAGNOSIS — I2510 Atherosclerotic heart disease of native coronary artery without angina pectoris: Secondary | ICD-10-CM | POA: Diagnosis not present

## 2016-03-01 DIAGNOSIS — Z8673 Personal history of transient ischemic attack (TIA), and cerebral infarction without residual deficits: Secondary | ICD-10-CM | POA: Diagnosis not present

## 2016-03-01 DIAGNOSIS — J439 Emphysema, unspecified: Secondary | ICD-10-CM | POA: Diagnosis not present

## 2016-03-01 DIAGNOSIS — Z87891 Personal history of nicotine dependence: Secondary | ICD-10-CM | POA: Insufficient documentation

## 2016-03-01 DIAGNOSIS — Z7901 Long term (current) use of anticoagulants: Secondary | ICD-10-CM | POA: Diagnosis not present

## 2016-03-01 DIAGNOSIS — G4730 Sleep apnea, unspecified: Secondary | ICD-10-CM | POA: Diagnosis not present

## 2016-03-01 DIAGNOSIS — Z01818 Encounter for other preprocedural examination: Secondary | ICD-10-CM | POA: Insufficient documentation

## 2016-03-01 DIAGNOSIS — Z9981 Dependence on supplemental oxygen: Secondary | ICD-10-CM | POA: Diagnosis not present

## 2016-03-01 DIAGNOSIS — F411 Generalized anxiety disorder: Secondary | ICD-10-CM | POA: Diagnosis not present

## 2016-03-01 DIAGNOSIS — I480 Paroxysmal atrial fibrillation: Secondary | ICD-10-CM | POA: Diagnosis present

## 2016-03-01 DIAGNOSIS — Z951 Presence of aortocoronary bypass graft: Secondary | ICD-10-CM | POA: Diagnosis not present

## 2016-03-01 DIAGNOSIS — I482 Chronic atrial fibrillation: Secondary | ICD-10-CM | POA: Insufficient documentation

## 2016-03-01 DIAGNOSIS — I5032 Chronic diastolic (congestive) heart failure: Secondary | ICD-10-CM | POA: Diagnosis not present

## 2016-03-01 DIAGNOSIS — I110 Hypertensive heart disease with heart failure: Secondary | ICD-10-CM | POA: Diagnosis not present

## 2016-03-01 DIAGNOSIS — J449 Chronic obstructive pulmonary disease, unspecified: Secondary | ICD-10-CM | POA: Diagnosis present

## 2016-03-01 DIAGNOSIS — K4120 Bilateral femoral hernia, without obstruction or gangrene, not specified as recurrent: Secondary | ICD-10-CM | POA: Diagnosis not present

## 2016-03-01 DIAGNOSIS — K4021 Bilateral inguinal hernia, without obstruction or gangrene, recurrent: Secondary | ICD-10-CM | POA: Diagnosis not present

## 2016-03-01 DIAGNOSIS — Z79899 Other long term (current) drug therapy: Secondary | ICD-10-CM | POA: Diagnosis not present

## 2016-03-01 DIAGNOSIS — K219 Gastro-esophageal reflux disease without esophagitis: Secondary | ICD-10-CM | POA: Diagnosis not present

## 2016-03-01 DIAGNOSIS — I10 Essential (primary) hypertension: Secondary | ICD-10-CM | POA: Diagnosis present

## 2016-03-01 HISTORY — PX: INGUINAL HERNIA REPAIR: SHX194

## 2016-03-01 HISTORY — PX: INSERTION OF MESH: SHX5868

## 2016-03-01 LAB — PROTIME-INR
INR: 0.94
Prothrombin Time: 12.5 s (ref 11.4–15.2)

## 2016-03-01 SURGERY — REPAIR, HERNIA, INGUINAL, BILATERAL, LAPAROSCOPIC
Anesthesia: General | Site: Groin | Laterality: Bilateral

## 2016-03-01 MED ORDER — LATANOPROST 0.005 % OP SOLN
1.0000 [drp] | Freq: Every day | OPHTHALMIC | Status: DC
  Administered 2016-03-01: 1 [drp] via OPHTHALMIC
  Filled 2016-03-01: qty 2.5

## 2016-03-01 MED ORDER — LABETALOL HCL 5 MG/ML IV SOLN
INTRAVENOUS | Status: AC
  Filled 2016-03-01: qty 4

## 2016-03-01 MED ORDER — METOPROLOL TARTRATE 12.5 MG HALF TABLET: 12.5000 mg | ORAL_TABLET | Freq: Two times a day (BID) | ORAL | Status: DC | PRN

## 2016-03-01 MED ORDER — LABETALOL HCL 5 MG/ML IV SOLN
INTRAVENOUS | Status: DC | PRN
  Administered 2016-03-01: 2.5 mg via INTRAVENOUS

## 2016-03-01 MED ORDER — METHOCARBAMOL 500 MG PO TABS: 500.0000 mg | ORAL_TABLET | Freq: Four times a day (QID) | ORAL | Status: DC | PRN

## 2016-03-01 MED ORDER — LIDOCAINE 2% (20 MG/ML) 5 ML SYRINGE
INTRAMUSCULAR | Status: AC
  Filled 2016-03-01: qty 5

## 2016-03-01 MED ORDER — HYDRALAZINE HCL 20 MG/ML IJ SOLN: 10.0000 mg | INTRAMUSCULAR | Status: DC | PRN

## 2016-03-01 MED ORDER — ROPIVACAINE HCL 7.5 MG/ML IJ SOLN
INTRAMUSCULAR | Status: AC
  Filled 2016-03-01: qty 20

## 2016-03-01 MED ORDER — HYDROMORPHONE HCL 1 MG/ML IJ SOLN: 0.5000 mg | INTRAMUSCULAR | Status: DC | PRN

## 2016-03-01 MED ORDER — ENOXAPARIN SODIUM 40 MG/0.4ML ~~LOC~~ SOLN
40.0000 mg | SUBCUTANEOUS | Status: DC
  Administered 2016-03-02: 40 mg via SUBCUTANEOUS
  Filled 2016-03-01: qty 0.4

## 2016-03-01 MED ORDER — LACTATED RINGERS IV SOLN
INTRAVENOUS | Status: DC
  Administered 2016-03-01: 1000 mL via INTRAVENOUS

## 2016-03-01 MED ORDER — BUPIVACAINE HCL (PF) 0.25 % IJ SOLN
INTRAMUSCULAR | Status: AC
  Filled 2016-03-01: qty 60

## 2016-03-01 MED ORDER — PHENOL 1.4 % MT LIQD: 2.0000 | OROMUCOSAL | Status: DC | PRN

## 2016-03-01 MED ORDER — 0.9 % SODIUM CHLORIDE (POUR BTL) OPTIME
TOPICAL | Status: DC | PRN
  Administered 2016-03-01: 1000 mL

## 2016-03-01 MED ORDER — BISOPROLOL FUMARATE 10 MG PO TABS
10.0000 mg | ORAL_TABLET | Freq: Two times a day (BID) | ORAL | Status: DC
  Administered 2016-03-01 – 2016-03-02 (×2): 10 mg via ORAL
  Filled 2016-03-01 (×2): qty 1

## 2016-03-01 MED ORDER — ONDANSETRON HCL 4 MG/2ML IJ SOLN
4.0000 mg | Freq: Four times a day (QID) | INTRAMUSCULAR | Status: DC | PRN
  Administered 2016-03-01: 4 mg via INTRAVENOUS
  Filled 2016-03-01: qty 2

## 2016-03-01 MED ORDER — STERILE WATER FOR IRRIGATION IR SOLN
Status: DC | PRN
  Administered 2016-03-01: 1000 mL

## 2016-03-01 MED ORDER — SUGAMMADEX SODIUM 200 MG/2ML IV SOLN
INTRAVENOUS | Status: AC
  Filled 2016-03-01: qty 2

## 2016-03-01 MED ORDER — IPRATROPIUM-ALBUTEROL 0.5-2.5 (3) MG/3ML IN SOLN: 3.0000 mL | Freq: Three times a day (TID) | RESPIRATORY_TRACT | Status: DC | PRN

## 2016-03-01 MED ORDER — LACTATED RINGERS IV BOLUS (SEPSIS): 1000.0000 mL | Freq: Three times a day (TID) | INTRAVENOUS | Status: DC | PRN

## 2016-03-01 MED ORDER — ACETAMINOPHEN 500 MG PO TABS
1000.0000 mg | ORAL_TABLET | ORAL | Status: AC
  Administered 2016-03-01: 1000 mg via ORAL
  Filled 2016-03-01: qty 2

## 2016-03-01 MED ORDER — SIMETHICONE 80 MG PO CHEW: 40.0000 mg | CHEWABLE_TABLET | Freq: Four times a day (QID) | ORAL | Status: DC | PRN

## 2016-03-01 MED ORDER — ROCURONIUM BROMIDE 50 MG/5ML IV SOSY
PREFILLED_SYRINGE | INTRAVENOUS | Status: AC
  Filled 2016-03-01: qty 5

## 2016-03-01 MED ORDER — PROPOFOL 10 MG/ML IV BOLUS
INTRAVENOUS | Status: DC | PRN
  Administered 2016-03-01: 80 mg via INTRAVENOUS
  Administered 2016-03-01: 20 mg via INTRAVENOUS

## 2016-03-01 MED ORDER — ISOSORBIDE MONONITRATE ER 30 MG PO TB24
60.0000 mg | ORAL_TABLET | Freq: Two times a day (BID) | ORAL | Status: DC
  Administered 2016-03-01 – 2016-03-02 (×2): 60 mg via ORAL
  Filled 2016-03-01 (×2): qty 2

## 2016-03-01 MED ORDER — HYDRALAZINE HCL 20 MG/ML IJ SOLN
5.0000 mg | Freq: Once | INTRAMUSCULAR | Status: AC
Start: 2016-03-01 — End: 2016-03-01
  Administered 2016-03-01: 5 mg via INTRAVENOUS

## 2016-03-01 MED ORDER — CEFAZOLIN SODIUM-DEXTROSE 2-4 GM/100ML-% IV SOLN
2.0000 g | INTRAVENOUS | Status: AC
  Administered 2016-03-01: 2 g via INTRAVENOUS
  Filled 2016-03-01: qty 100

## 2016-03-01 MED ORDER — SODIUM CHLORIDE 0.9% FLUSH
3.0000 mL | Freq: Two times a day (BID) | INTRAVENOUS | Status: DC
  Administered 2016-03-02: 3 mL via INTRAVENOUS

## 2016-03-01 MED ORDER — HYDRALAZINE HCL 20 MG/ML IJ SOLN
INTRAMUSCULAR | Status: AC
  Administered 2016-03-01: 5 mg via INTRAVENOUS
  Filled 2016-03-01: qty 1

## 2016-03-01 MED ORDER — LORAZEPAM 2 MG/ML IJ SOLN: 0.5000 mg | Freq: Three times a day (TID) | INTRAMUSCULAR | Status: DC | PRN

## 2016-03-01 MED ORDER — MENTHOL 3 MG MT LOZG: 1.0000 | LOZENGE | OROMUCOSAL | Status: DC | PRN

## 2016-03-01 MED ORDER — MONTELUKAST SODIUM 10 MG PO TABS
10.0000 mg | ORAL_TABLET | Freq: Every day | ORAL | Status: DC
  Administered 2016-03-01: 10 mg via ORAL
  Filled 2016-03-01: qty 1

## 2016-03-01 MED ORDER — CEFAZOLIN SODIUM-DEXTROSE 2-4 GM/100ML-% IV SOLN
INTRAVENOUS | Status: AC
  Filled 2016-03-01: qty 100

## 2016-03-01 MED ORDER — BUPIVACAINE HCL (PF) 0.25 % IJ SOLN
INTRAMUSCULAR | Status: DC | PRN
  Administered 2016-03-01: 45 mL

## 2016-03-01 MED ORDER — ROCURONIUM BROMIDE 10 MG/ML (PF) SYRINGE
PREFILLED_SYRINGE | INTRAVENOUS | Status: DC | PRN
  Administered 2016-03-01: 40 mg via INTRAVENOUS

## 2016-03-01 MED ORDER — DIPHENHYDRAMINE HCL 50 MG/ML IJ SOLN: 12.5000 mg | Freq: Four times a day (QID) | INTRAMUSCULAR | Status: DC | PRN

## 2016-03-01 MED ORDER — LIP MEDEX EX OINT
1.0000 "application " | TOPICAL_OINTMENT | Freq: Two times a day (BID) | CUTANEOUS | Status: DC
  Administered 2016-03-01 – 2016-03-02 (×2): 1 via TOPICAL
  Filled 2016-03-01: qty 7

## 2016-03-01 MED ORDER — SODIUM CHLORIDE 0.9 % IV SOLN: 250.0000 mL | INTRAVENOUS | Status: DC | PRN

## 2016-03-01 MED ORDER — TRAMADOL HCL 50 MG PO TABS
50.0000 mg | ORAL_TABLET | Freq: Four times a day (QID) | ORAL | Status: DC | PRN
  Administered 2016-03-02: 50 mg via ORAL
  Filled 2016-03-01: qty 1

## 2016-03-01 MED ORDER — NITROGLYCERIN 0.4 MG SL SUBL: 0.4000 mg | SUBLINGUAL_TABLET | SUBLINGUAL | Status: DC | PRN

## 2016-03-01 MED ORDER — POLYETHYLENE GLYCOL 3350 17 G PO PACK: 17.0000 g | PACK | Freq: Two times a day (BID) | ORAL | Status: DC | PRN

## 2016-03-01 MED ORDER — PROCHLORPERAZINE EDISYLATE 5 MG/ML IJ SOLN: 5.0000 mg | INTRAMUSCULAR | Status: DC | PRN

## 2016-03-01 MED ORDER — ESMOLOL HCL 100 MG/10ML IV SOLN
INTRAVENOUS | Status: AC
  Filled 2016-03-01: qty 10

## 2016-03-01 MED ORDER — FENTANYL CITRATE (PF) 100 MCG/2ML IJ SOLN
25.0000 ug | INTRAMUSCULAR | Status: DC | PRN
  Administered 2016-03-01: 25 ug via INTRAVENOUS

## 2016-03-01 MED ORDER — MAGIC MOUTHWASH
15.0000 mL | Freq: Four times a day (QID) | ORAL | Status: DC | PRN
  Filled 2016-03-01: qty 15

## 2016-03-01 MED ORDER — METOPROLOL TARTRATE 5 MG/5ML IV SOLN: 5.0000 mg | Freq: Four times a day (QID) | INTRAVENOUS | Status: DC | PRN

## 2016-03-01 MED ORDER — LORAZEPAM 0.5 MG PO TABS
0.5000 mg | ORAL_TABLET | Freq: Two times a day (BID) | ORAL | Status: DC
  Administered 2016-03-02: 0.5 mg via ORAL
  Filled 2016-03-01 (×2): qty 1

## 2016-03-01 MED ORDER — PANTOPRAZOLE SODIUM 40 MG PO TBEC
40.0000 mg | DELAYED_RELEASE_TABLET | Freq: Every day | ORAL | Status: DC
  Administered 2016-03-02: 40 mg via ORAL
  Filled 2016-03-01: qty 1

## 2016-03-01 MED ORDER — TRAMADOL HCL 50 MG PO TABS: 50.0000 mg | ORAL_TABLET | Freq: Four times a day (QID) | ORAL | 0 refills | Status: DC | PRN

## 2016-03-01 MED ORDER — GABAPENTIN 300 MG PO CAPS
300.0000 mg | ORAL_CAPSULE | ORAL | Status: AC
  Administered 2016-03-01: 300 mg via ORAL
  Filled 2016-03-01: qty 1

## 2016-03-01 MED ORDER — PROPOFOL 10 MG/ML IV BOLUS
INTRAVENOUS | Status: AC
  Filled 2016-03-01: qty 20

## 2016-03-01 MED ORDER — ACETAMINOPHEN 500 MG PO TABS
1000.0000 mg | ORAL_TABLET | Freq: Three times a day (TID) | ORAL | Status: DC
  Administered 2016-03-01 – 2016-03-02 (×3): 1000 mg via ORAL
  Filled 2016-03-01 (×3): qty 2

## 2016-03-01 MED ORDER — IRBESARTAN 150 MG PO TABS
75.0000 mg | ORAL_TABLET | Freq: Every day | ORAL | Status: DC
  Administered 2016-03-02: 75 mg via ORAL
  Filled 2016-03-01: qty 1

## 2016-03-01 MED ORDER — BISACODYL 10 MG RE SUPP: 10.0000 mg | Freq: Two times a day (BID) | RECTAL | Status: DC | PRN

## 2016-03-01 MED ORDER — FENTANYL CITRATE (PF) 100 MCG/2ML IJ SOLN
INTRAMUSCULAR | Status: DC | PRN
  Administered 2016-03-01: 50 ug via INTRAVENOUS
  Administered 2016-03-01: 25 ug via INTRAVENOUS
  Administered 2016-03-01: 50 ug via INTRAVENOUS

## 2016-03-01 MED ORDER — ONDANSETRON HCL 4 MG/2ML IJ SOLN
INTRAMUSCULAR | Status: AC
  Filled 2016-03-01: qty 2

## 2016-03-01 MED ORDER — SODIUM CHLORIDE 0.9% FLUSH: 3.0000 mL | INTRAVENOUS | Status: DC | PRN

## 2016-03-01 MED ORDER — SUGAMMADEX SODIUM 200 MG/2ML IV SOLN
INTRAVENOUS | Status: DC | PRN
  Administered 2016-03-01: 130 mg via INTRAVENOUS

## 2016-03-01 MED ORDER — ONDANSETRON HCL 4 MG/2ML IJ SOLN
INTRAMUSCULAR | Status: DC | PRN
  Administered 2016-03-01: 4 mg via INTRAVENOUS

## 2016-03-01 MED ORDER — PRAVASTATIN SODIUM 20 MG PO TABS
80.0000 mg | ORAL_TABLET | Freq: Every morning | ORAL | Status: DC
  Administered 2016-03-02: 80 mg via ORAL
  Filled 2016-03-01: qty 4

## 2016-03-01 MED ORDER — POLYETHYLENE GLYCOL 3350 17 G PO PACK
17.0000 g | PACK | Freq: Every day | ORAL | Status: DC
  Administered 2016-03-02: 17 g via ORAL
  Filled 2016-03-01: qty 1

## 2016-03-01 MED ORDER — FENTANYL CITRATE (PF) 100 MCG/2ML IJ SOLN
INTRAMUSCULAR | Status: AC
  Filled 2016-03-01: qty 2

## 2016-03-01 MED ORDER — ADULT MULTIVITAMIN W/MINERALS CH
1.0000 | ORAL_TABLET | Freq: Every day | ORAL | Status: DC
  Administered 2016-03-02: 1 via ORAL
  Filled 2016-03-01: qty 1

## 2016-03-01 MED ORDER — ALBUTEROL SULFATE (2.5 MG/3ML) 0.083% IN NEBU: 3.0000 mL | INHALATION_SOLUTION | Freq: Four times a day (QID) | RESPIRATORY_TRACT | Status: DC | PRN

## 2016-03-01 MED ORDER — ONDANSETRON 4 MG PO TBDP: 4.0000 mg | ORAL_TABLET | Freq: Four times a day (QID) | ORAL | Status: DC | PRN

## 2016-03-01 MED ORDER — VITAMIN C 500 MG PO TABS
500.0000 mg | ORAL_TABLET | Freq: Two times a day (BID) | ORAL | Status: DC
  Administered 2016-03-01 – 2016-03-02 (×2): 500 mg via ORAL
  Filled 2016-03-01 (×2): qty 1

## 2016-03-01 MED ORDER — MOMETASONE FURO-FORMOTEROL FUM 100-5 MCG/ACT IN AERO
2.0000 | INHALATION_SPRAY | Freq: Every evening | RESPIRATORY_TRACT | Status: DC
  Filled 2016-03-01: qty 8.8

## 2016-03-01 MED ORDER — DIPHENHYDRAMINE HCL 12.5 MG/5ML PO ELIX: 12.5000 mg | ORAL_SOLUTION | Freq: Four times a day (QID) | ORAL | Status: DC | PRN

## 2016-03-01 MED ORDER — TORSEMIDE 10 MG PO TABS
10.0000 mg | ORAL_TABLET | Freq: Every day | ORAL | Status: DC
  Administered 2016-03-02: 10 mg via ORAL
  Filled 2016-03-01: qty 1

## 2016-03-01 SURGICAL SUPPLY — 33 items
CABLE HIGH FREQUENCY MONO STRZ (ELECTRODE) ×4 IMPLANT
CHLORAPREP W/TINT 26ML (MISCELLANEOUS) ×4 IMPLANT
COVER SURGICAL LIGHT HANDLE (MISCELLANEOUS) ×4 IMPLANT
DECANTER SPIKE VIAL GLASS SM (MISCELLANEOUS) ×4 IMPLANT
DEVICE SECURE STRAP 25 ABSORB (INSTRUMENTS) IMPLANT
DRAPE WARM FLUID 44X44 (DRAPE) ×4 IMPLANT
DRSG TEGADERM 2-3/8X2-3/4 SM (GAUZE/BANDAGES/DRESSINGS) ×6 IMPLANT
DRSG TEGADERM 4X4.75 (GAUZE/BANDAGES/DRESSINGS) ×4 IMPLANT
ELECT REM PT RETURN 9FT ADLT (ELECTROSURGICAL) ×4
ELECTRODE REM PT RTRN 9FT ADLT (ELECTROSURGICAL) ×2 IMPLANT
GAUZE SPONGE 2X2 8PLY STRL LF (GAUZE/BANDAGES/DRESSINGS) ×2 IMPLANT
GLOVE ECLIPSE 8.0 STRL XLNG CF (GLOVE) ×4 IMPLANT
GLOVE INDICATOR 8.0 STRL GRN (GLOVE) ×4 IMPLANT
GOWN STRL REUS W/TWL XL LVL3 (GOWN DISPOSABLE) ×8 IMPLANT
IRRIG SUCT STRYKERFLOW 2 WTIP (MISCELLANEOUS)
IRRIGATION SUCT STRKRFLW 2 WTP (MISCELLANEOUS) IMPLANT
KIT BASIN OR (CUSTOM PROCEDURE TRAY) ×4 IMPLANT
MESH ULTRAPRO 6X6 15CM15CM (Mesh General) ×6 IMPLANT
PAD POSITIONING PINK XL (MISCELLANEOUS) ×4 IMPLANT
SCISSORS LAP 5X35 DISP (ENDOMECHANICALS) ×4 IMPLANT
SET IRRIG TUBING LAPAROSCOPIC (IRRIGATION / IRRIGATOR) ×4 IMPLANT
SLEEVE ADV FIXATION 5X100MM (TROCAR) ×4 IMPLANT
SPONGE GAUZE 2X2 STER 10/PKG (GAUZE/BANDAGES/DRESSINGS) ×2
SUT MNCRL AB 4-0 PS2 18 (SUTURE) ×4 IMPLANT
SUT PDS AB 1 CT1 27 (SUTURE) ×4 IMPLANT
SUT VIC AB 2-0 SH 27 (SUTURE) ×8
SUT VIC AB 2-0 SH 27X BRD (SUTURE) IMPLANT
TACKER 5MM HERNIA 3.5CML NAB (ENDOMECHANICALS) IMPLANT
TOWEL OR 17X26 10 PK STRL BLUE (TOWEL DISPOSABLE) ×4 IMPLANT
TRAY LAPAROSCOPIC (CUSTOM PROCEDURE TRAY) ×4 IMPLANT
TROCAR ADV FIXATION 5X100MM (TROCAR) ×4 IMPLANT
TROCAR XCEL BLUNT TIP 100MML (ENDOMECHANICALS) ×4 IMPLANT
TUBING INSUF HEATED (TUBING) ×4 IMPLANT

## 2016-03-01 NOTE — Interval H&P Note (Signed)
History and Physical Interval Note:  03/01/2016 11:56 AM  [name].  has presented today for surgery, with the diagnosis of BILATERAL INGUINAL HERNIA  The various methods of treatment have been discussed with the patient and family. After consideration of risks, benefits and other options for treatment, the patient has consented to  Procedure(s): Selz (Bilateral) as a surgical intervention .  The patient's history has been reviewed, patient examined, no change in status, stable for surgery.  I have reviewed the patient's chart and labs.  Questions were answered to the patient's satisfaction.     Stephen Hewes C.

## 2016-03-01 NOTE — H&P (Signed)
[name] 12/20/2015 3:06 PM Location: [facility] Patient #: [id number] DOB: 04/23/1930 Married / Language: [name] / Race: White Male  Patient Care Team: Binnie Rail, MD as PCP - General (Internal Medicine) Minus Breeding, MD as Consulting Physician (Cardiology) Deneise Lever, MD as Consulting Physician (Pulmonary Disease) Irene Shipper, MD as Consulting Physician (Gastroenterology) Jari Pigg, MD as Consulting Physician (Dermatology) Nani Ravens, MD as Referring Physician (Dermatology) Michael Boston, MD as Consulting Physician (General Surgery)   History of Present Illness Stephen Hector MD; 12/21/2015 1:36 PM) The patient is a 82 year old male who presents for an evaluation of a hernia. Note for "Hernia": Patient sent for surgical consultation for concern of inguinal hernias. Sent by Dr. Sharlet Salina.   Elderly gentleman. Recalls having an open right inguinal hernia. The 1960s. Right groin rather swollen more recently. He is noted some left groin pain especially in the past month. He rates the testicle. He's had some issues with pneumonia. Just, hospital couple days ago. Normally rather active person. Can walk a half hour without much difficulty. Wife claims to gets after trying keep up with him. Patient notes the that the groin pain especially in the left side is gradually worsened to the point that it uncomfortable to walk or stand for long periods of time. Straits him. To have it fixed. His deftly affecting his quality of life. Usually has a bowel movement every day. Occasionally loose.  Fall by cardiology as well. On chronic blood thinners. He initially was started on Plavix after stent was placed. He is in chronic atrial fibrillation. Has some mild carotid stenosis around 40-59 percent. Some question of a stroke recently. Patient and his wife claims that he is on Plavix and Eliquis. Last note by his cardiologist, Dr. Archie Patten,  recommended continuing on Eliquis. She denies any significant rectal bleeding.   Other Problems Illene Regulus, CMA; 12/20/2015 3:06 PM) Alcohol Abuse Asthma Cerebrovascular Accident Chronic Obstructive Lung Disease Congestive Heart Failure Emphysema Of Lung Gastroesophageal Reflux Disease High blood pressure Home Oxygen Use Melanoma Myocardial infarction Sleep Apnea  Past Surgical History Lars Mage Spillers, CMA; 12/20/2015 3:06 PM) Bypass Surgery for Poor Blood Flow to Legs Coronary Artery Bypass Graft  Allergies (Alisha Spillers, CMA; 12/20/2015 3:12 PM) Codeine Phosphate *ANALGESICS - OPIOID* Morphine Sulfate (Concentrate) *ANALGESICS - OPIOID* Lasix *DIURETICS* Lidocaine *CHEMICALS* Avelox *FLUOROQUINOLONES*  Medication History (Alisha Spillers, CMA; 12/20/2015 3:20 PM) Latanoprost (0.005% Solution, Ophthalmic) Active. LORazepam (0.5MG  Tablet, Oral) Active. Azithromycin (500MG  Tablet, Oral) Active. Cholestyramine (4GM Packet, Oral) Active. Loperamide HCl (2MG  Capsule, Oral) Active. Spironolactone (25MG  Tablet, Oral) Active. Spironolactone (50MG  Tablet, Oral) Active. Torsemide (20MG  Tablet, Oral) Active. Montelukast Sodium (10MG  Tablet, Oral) Active. Pravastatin Sodium (80MG  Tablet, Oral) Active. RaNITidine HCl (150MG  Tablet, Oral) Active. Tamsulosin HCl (0.4MG  Capsule, Oral) Active. Valsartan (320MG  Tablet, Oral) Active. Oscal 500/200 D-3 (500-200MG -UNIT Tablet, Oral) Active. Questran (4GM Packet, Oral) Active. Clopidogrel Bisulfate (75MG  Tablet, Oral) Active. Dexilant (60MG  Capsule DR, Oral) Active. Isosorbide Mononitrate ER (60MG  Tablet ER 24HR, Oral) Active. Eliquis (5MG  Tablet, Oral) Active. Medications Reconciled  Family History Illene Regulus, CMA; 12/20/2015 3:06 PM) Alcohol Abuse Brother. Arthritis Sister. Heart Disease Father, Mother. Heart disease in male family member before age 73 Heart disease in male  family member before age 74 Hypertension Mother. Respiratory Condition Sister.    Review of Systems Lars Mage Spillers CMA; 12/20/2015 3:06 PM) General Present- Fatigue. Not Present- Appetite Loss, Chills, Fever, Night Sweats, Weight Gain and Weight Loss. Skin Present- Dryness. Not  Present- Change in Wart/Mole, Hives, Jaundice, New Lesions, Non-Healing Wounds, Rash and Ulcer. HEENT Present- Hearing Loss, Ringing in the Ears, Seasonal Allergies and Wears glasses/contact lenses. Not Present- Earache, Hoarseness, Nose Bleed, Oral Ulcers, Sinus Pain, Sore Throat, Visual Disturbances and Yellow Eyes. Respiratory Present- Bloody sputum, Chronic Cough, Difficulty Breathing, Snoring and Wheezing. Cardiovascular Present- Difficulty Breathing Lying Down and Shortness of Breath. Not Present- Chest Pain, Leg Cramps, Palpitations, Rapid Heart Rate and Swelling of Extremities. Gastrointestinal Present- Chronic diarrhea and Indigestion. Not Present- Abdominal Pain, Bloating, Bloody Stool, Change in Bowel Habits, Constipation, Difficulty Swallowing, Excessive gas, Gets full quickly at meals, Hemorrhoids, Nausea, Rectal Pain and Vomiting. Musculoskeletal Present- Muscle Weakness. Not Present- Back Pain, Joint Pain, Joint Stiffness, Muscle Pain and Swelling of Extremities. Neurological Present- Decreased Memory and Weakness. Not Present- Fainting, Headaches, Numbness, Seizures, Tingling, Tremor and Trouble walking. Hematology Present- Blood Thinners. Not Present- Easy Bruising, Excessive bleeding, Gland problems, HIV and Persistent Infections.  Vitals (Alisha Spillers CMA; 12/20/2015 3:11 PM) 12/20/2015 3:07 PM Weight: 147 lb Height: 62in Body Surface Area: 1.68 m Body Mass Index: 26.89 kg/m  Pulse: 86 (Regular)  BP: 162/82 (Sitting, Left Arm, Standard)       Assessment & Plan  BILATERAL INGUINAL HERNIA WITHOUT OBSTRUCTION OR GANGRENE, RECURRENCE NOT SPECIFIED (K40.20) Impression: Recurrent  right inguinal hernia with tip of the appendix in it = Littre's hernia. Reducible. Very sensitive but smaller left inguinal hernia.  Standard treatment would be hernia repair. I think he would benefit from laparoscopic preperitoneal approach with mesh. His risks are elevated given his advanced age and cardiopulmonary issues, but he otherwise is rather active. He feels strongly that the pain in his left groin is severe enough that it is affecting his quality of life. He actually is rather active and tries to be very independent. He wishes to be aggressive and have it repaired.  He is chronically anticoagulated for his atrial fibrillation and stroke.  Because he otherwise has pretty good exercise tolerance, I think he ultimately could tolerate surgery. However, I definitely would like cardiology clearance. Would like pulmonary clearance to make sure he's adequately recovered from his recent hospitalization for pneumonia before proceeding with surgery. He recently saw both specialists, so hopefully will not be too involved  He is on Eliquis only per Dr Percival Spanish.   Cleared by cardiology & pulmonary  Anticipate he would be an overnight stay given his cardiopulmonary issues. Make sure his hemoglobin etc. Stable.    PREOP - ING HERNIA - ENCOUNTER FOR PREOPERATIVE EXAMINATION FOR GENERAL SURGICAL PROCEDURE (Z01.818) Current Plans You are being scheduled for surgery - Our schedulers will call you.  You should hear from our office's scheduling department within 5 working days about the location, date, and time of surgery. We try to make accommodations for patient's preferences in scheduling surgery, but sometimes the OR schedule or the surgeon's schedule prevents [place] from making those accommodations.  If you have not heard from our office [phone]) in 5 working days, call the office and ask for your surgeon's nurse.  If you have other questions about your diagnosis, plan, or surgery, call the  office and ask for your surgeon's nurse.  Written instructions provided The anatomy & physiology of the abdominal wall and pelvic floor was discussed. The pathophysiology of hernias in the inguinal and pelvic region was discussed. Natural history risks such as progressive enlargement, pain, incarceration, and strangulation was discussed. Contributors to complications such as smoking, obesity, diabetes, prior surgery, etc were discussed.  I feel the risks of no intervention will lead to serious problems that outweigh the operative risks; therefore, I recommended surgery to reduce and repair the hernia. I explained laparoscopic techniques with possible need for an open approach. I noted usual use of mesh to patch and/or buttress hernia repair  Risks such as bleeding, infection, abscess, need for further treatment, heart attack, death, and other risks were discussed. I noted a good likelihood this will help address the problem. Goals of post-operative recovery were discussed as well. Possibility that this will not correct all symptoms was explained. I stressed the importance of low-impact activity, aggressive pain control, avoiding constipation, & not pushing through pain to minimize risk of post-operative chronic pain or injury. Possibility of reherniation was discussed. We will work to minimize complications.  An educational handout further explaining the pathology & treatment options was given as well. Questions were answered. The patient expresses understanding & wishes to proceed with surgery.  Pt Education - Pamphlet Given - Laparoscopic Hernia Repair: discussed with patient and provided information. Pt Education - CCS Pain Control (Naketa Daddario) Pt Education - CCS Hernia Post-Op HCI (Goran Olden): discussed with patient and provided information. UMBILICAL HERNIA WITHOUT OBSTRUCTION AND WITHOUT GANGRENE (K42.9) Impression: Small but definite umbilical hernia in the setting diastases recti. Would anticipate  primary repair of the time of inguinal hernia surgery. DIASTASIS RECTI (M62.08) Current Plans Pt Education - CCS Diastasis Recti: discussed with patient and provided information.   I have re-reviewed the the patient's records, history, medications, and allergies.  I have re-examined the patient.  I again discussed intraoperative plans and goals of post-operative recovery.  The patient agrees to proceed.   [name], M.D., F.A.C.S. Gastrointestinal and Minimally Invasive Surgery [facility] [address] [place], [place] [address] [phone] Main / Paging

## 2016-03-01 NOTE — Op Note (Signed)
03/01/2016  2:56 PM  PATIENT:  [name].  82 y.o. male  Patient Care Team: Binnie Rail, MD as PCP - General (Internal Medicine) Minus Breeding, MD as Consulting Physician (Cardiology) Deneise Lever, MD as Consulting Physician (Pulmonary Disease) Irene Shipper, MD as Consulting Physician (Gastroenterology) Jari Pigg, MD as Consulting Physician (Dermatology) Nani Ravens, MD as Referring Physician (Dermatology) Michael Boston, MD as Consulting Physician (General Surgery)  PRE-OPERATIVE DIAGNOSIS:    BILATERAL INGUINAL HERNIA, recurrent Umbilical hernia  POST-OPERATIVE DIAGNOSIS:    BILATERAL INGUINAL HERNIA, RECURRENT  BILATERAL FEMORAL HERNIAS Umbilical hernia  PROCEDURE:    LAPAROSCOPIC BILATERAL INGUINAL AND FEMORAL HERNIA REPAIRs PRIMARY REPAIR OF UMBILICAL HERNIA INSERTION OF MESH  SURGEON:  Surgeon(s): Michael Boston, MD  ASSISTANT: RN   ANESTHESIA:   Regional ilioinguinal and genitofemoral and spermatic cord nerve blocks with GETA  EBL:  No intake/output data recorded.  Delay start of Pharmacological VTE agent (>24hrs) due to surgical blood loss or risk of bleeding:  no  DRAINS: NONE  SPECIMEN:  NONE  DISPOSITION OF SPECIMEN:  N/A  COUNTS:  YES  PLAN OF CARE: Discharge to home after PACU  PATIENT DISPOSITION:  PACU - hemodynamically stable.  INDICATION: Patient status post repair of inguinal hernias many decades ago with evidence recurrence especially on the right side.  Increasing pain and discomfort with appendix in the right hernia consistent with a liter's hernia.  Has some cardiopulmonary issues and advanced age, but feels this is affecting his quality of life and wishes to proceed with surgery.  A lot of pain with activity and prolonged sitting.  The anatomy & physiology of the abdominal wall and pelvic floor was discussed.  The pathophysiology of hernias in the inguinal and pelvic region was discussed.  Natural history risks such  as progressive enlargement, pain, incarceration & strangulation was discussed.   Contributors to complications such as smoking, obesity, diabetes, prior surgery, etc were discussed.    I feel the risks of no intervention will lead to serious problems that outweigh the operative risks; therefore, I recommended surgery to reduce and repair the hernia.  I explained laparoscopic techniques with possible need for an open approach.  I noted usual use of mesh to patch and/or buttress hernia repair  Risks such as bleeding, infection, abscess, need for further treatment, heart attack, death, and other risks were discussed.  I noted a good likelihood this will help address the problem.   Goals of post-operative recovery were discussed as well.  Possibility that this will not correct all symptoms was explained.  I stressed the importance of low-impact activity, aggressive pain control, avoiding constipation, & not pushing through pain to minimize risk of post-operative chronic pain or injury. Possibility of reherniation was discussed.  We will work to minimize complications.     An educational handout further explaining the pathology & treatment options was given as well.  Questions were answered.  The patient expresses understanding & wishes to proceed with surgery.  OR FINDINGS: On the right side he had his appendix and an indirect inguinal hernia consistent with a Littre's hernia.  Moderate-sized femoral hernia as well.  No direct space ruptured hernias.  On the left he had an obvious direct space inguinal hernia.  No strong evidence of a indirect inguinal hernia.  He and obvious large or femoral hernia on the left side.  The obturator hernia  DESCRIPTION:   The patient was identified & brought into the operating room. The  patient was positioned supine with arms tucked. SCDs were active during the entire case. The patient underwent general anesthesia without any difficulty.  The abdomen was prepped and draped  in a sterile fashion. The patient's bladder was emptied.  A Surgical Timeout confirmed our plan.  I made a transverse incision through the inferior umbilical fold.  I made a small transverse nick through the anterior rectus fascia contralateral to the inguinal hernia side and placed a 0-vicryl stitch through the fascia.  I placed a Hasson trocar into the preperitoneal plane.  Entry was clean.  We induced carbon dioxide insufflation. Camera inspection revealed no injury.  I used a 71mm angled scope to bluntly free the peritoneum off the infraumbilical anterior abdominal wall.  I created enough of a preperitoneal pocket to place 73mm ports into the right & left mid-abdomen into this preperitoneal cavity.  I focused attention on the right side since that was the dominant hernia side.   I used blunt & focused sharp dissection to free the peritoneum off the flank and down to the pubic rim.  Patient had some moderate adhesions consistent with his prior hernia repairs.  This took some time.  He also had significant diastases and stretched out tissue consistent with somewhat advance elderly age.  I freed the anteriolateral bladder wall off the anteriolateral pelvic wall, sparing midline attachments.   I located a swath of peritoneum going into a hernia fascial defect at the internal ring & femoral rings consistent with indirect inguinal and femoral hernias.  I gradually freed the peritoneal hernia sac off safely and reduced it into the preperitoneal space.   The patient had the appendix chronically in that hernia consistent with a classic Littre's hernia.  I dissected the uninflamed appendix out of the hernia sac and closed that down for a high ligation.  He did the preperitoneal fat with a high ligation to help minimize lead points for hernia recurrence.I freed the peritoneum off the spermatic vessels & vas deferens.  I freed peritoneum off the retroperitoneum along the psoas muscle.    I checked & assured hemostasis.     I turned attention on the opposite side.  I did dissection in a similar, mirror-image fashion. The patient had a direct space inguinal hernia and a moderate-sized femoral hernia as well.  End up doing high ligation of the direct sac as well as femoral sacs and closure of peritoneal defect created given lysis adhesions of prior open repair.      I chose 15x15 cm sheets of ultra-lightweight polypropylene mesh (Ultrapro), one for each side.  I cut a single sigmoid-shaped slit ~6cm from a corner of each mesh.  I placed the meshes into the preperitoneal space & laid them as overlapping diamonds such that at the inferior points, a 6x6 cm corner flap rested in the true anterolateral pelvis, covering the obturator & femoral foramina.   I allowed the bladder to return to the pubis, this helping tuck the corners of the mesh in the anteriolateral pelvis.  The medial corners overlapped each other across midline cephalad to the pubic rim.    however because of the large defects especially the indirect hernia on the right side, I placed a third mesh as a diamond along the midline.  Lateral wings overlapped across the direct space in internal hernias. This provided >2 inch coverage around the hernia.   Because the defects well covered and not particularly large, I did not place any tacks.  I held the  hernia sacs cephalad & evacuated carbon dioxide.  I went ahead and did the umbilical hernia or repair.  And did dissection freed the umbilical stalk and removed the umbilical hernia.  Patient had a two half by 2 cm defect.  I primarily repaired a transverse using #1 PDS interrupted suture.  I reattached the umbilical hernia down.  I closed the infraumbilical fascial defect created for the inguinal hernia port with 0 Vicryl suture.  I closed the skin using 4-0 monocryl stitch.  Sterile dressings were applied.   The patient was extubated & arrived in the PACU in stable condition..  I had discussed postoperative care with the  patient in the holding area.  Instructions are written in the chart.  I discussed operative findings, updated the patient's status, discussed probable steps to recovery, and gave postoperative recommendations to the patient's family.  Recommendations were made.  Questions were answered.  They expressed understanding & appreciation.  Given his atrial fibrillation and pulmonary issues, we will watch him overnight.     [name], M.D., F.A.C.S. Gastrointestinal and Minimally Invasive Surgery [facility] [address] [place], [place] [address] [phone] Main / Paging

## 2016-03-01 NOTE — Transfer of Care (Signed)
Immediate Anesthesia Transfer of Care Note  Patient: [name].  Procedure(s) Performed: Procedure(s): LAPAROSCOPIC BILATERAL INGUINAL AND FEMORAL HERNIA REPAIR WITH PRIMARY REPAIR OF UMBILICAL HERNIA (Bilateral) INSERTION OF MESH (Bilateral)  Patient Location: PACU  Anesthesia Type:General  Level of Consciousness:  sedated, patient cooperative and responds to stimulation  Airway & Oxygen Therapy:Patient Spontanous Breathing and Patient connected to face mask oxgen  Post-op Assessment:  Report given to PACU RN and Post -op Vital signs reviewed and stable  Post vital signs:  Reviewed and stable  Last Vitals:  Vitals:   03/01/16 1001 03/01/16 1507  BP: (!) 180/78   Pulse: 83   Resp: 18   Temp: 36.4 C [id number]    Complications: No apparent anesthesia complications

## 2016-03-01 NOTE — Anesthesia Preprocedure Evaluation (Signed)
Anesthesia Evaluation  Patient identified by MRN, date of birth, ID band Patient awake    Reviewed: Allergy & Precautions, NPO status   History of Anesthesia Complications Negative for: history of anesthetic complications  Airway Mallampati: II  TM Distance: >3 FB Neck ROM: Full    Dental  (+) Edentulous Upper   Pulmonary shortness of breath, sleep apnea , COPD, former smoker,    breath sounds clear to auscultation       Cardiovascular hypertension, + CAD, + Past MI, + Cardiac Stents, + Peripheral Vascular Disease and +CHF  + Valvular Problems/Murmurs AS  Rhythm:Regular Rate:Normal + Systolic murmurs Recent ECHO and cardiac notes reviewed . presrved LV function   Neuro/Psych  Neuromuscular disease CVA    GI/Hepatic GERD  ,  Endo/Other    Renal/GU Renal InsufficiencyRenal disease     Musculoskeletal  (+) Arthritis ,   Abdominal   Peds  Hematology  (+) anemia ,   Anesthesia Other Findings   Reproductive/Obstetrics                             Anesthesia Physical Anesthesia Plan  ASA: IV  Anesthesia Plan: General   Post-op Pain Management:    Induction: Intravenous  Airway Management Planned: Oral ETT  Additional Equipment:   Intra-op Plan:   Post-operative Plan: Extubation in OR  Informed Consent: I have reviewed the patients History and Physical, chart, labs and discussed the procedure including the risks, benefits and alternatives for the proposed anesthesia with the patient or authorized representative who has indicated his/her understanding and acceptance.   Dental advisory given  Plan Discussed with:   Anesthesia Plan Comments:         Anesthesia Quick Evaluation

## 2016-03-01 NOTE — Anesthesia Procedure Notes (Signed)
Procedure Name: Intubation Date/Time: 03/01/2016 1:14 PM Performed by: Anne Fu Pre-anesthesia Checklist: Patient identified, Emergency Drugs available, Suction available, Patient being monitored and Timeout performed Patient Re-evaluated:Patient Re-evaluated prior to inductionOxygen Delivery Method: Circle system utilized Preoxygenation: Pre-oxygenation with 100% oxygen Intubation Type: IV induction Ventilation: Mask ventilation without difficulty Laryngoscope Size: Mac and 4 Grade View: Grade I Tube type: Oral Tube size: 7.5 mm Number of attempts: 1 Airway Equipment and Method: Stylet Placement Confirmation: ETT inserted through vocal cords under direct vision,  positive ETCO2,  CO2 detector and breath sounds checked- equal and bilateral Secured at: 20 cm Tube secured with: Tape Dental Injury: Teeth and Oropharynx as per pre-operative assessment

## 2016-03-01 NOTE — Progress Notes (Signed)
RT spoke with pt's family member and she stated that she was unsure if the Pt wanted to utilize CPAP tonight.  Pt is currently sleeping and she wishes not to wake.  She also stated that he has his home CPAP machine and she would contact RT if he decided he wanted to use tonight.  RT to monitor and assess as needed.

## 2016-03-01 NOTE — Discharge Instructions (Signed)
HERNIA REPAIR: POST OP INSTRUCTIONS ° °###################################################################### ° °EAT °Gradually transition to a high fiber diet with a fiber supplement over the next few weeks after discharge.  Start with a pureed / full liquid diet (see below) ° °WALK °Walk an hour a day.  Control your pain to do that.   ° °CONTROL PAIN °Control pain so that you can walk, sleep, tolerate sneezing/coughing, go up/down stairs. ° °HAVE A BOWEL MOVEMENT DAILY °Keep your bowels regular to avoid problems.  OK to try a laxative to override constipation.  OK to use an antidairrheal to slow down diarrhea.  Call if not better after 2 tries ° °CALL IF YOU HAVE PROBLEMS/CONCERNS °Call if you are still struggling despite following these instructions. °Call if you have concerns not answered by these instructions ° °###################################################################### ° ° ° °1. DIET: Follow a light bland diet the first 24 hours after arrival home, such as soup, liquids, crackers, etc.  Be sure to include lots of fluids daily.  Avoid fast food or heavy meals as your are more likely to get nauseated.  Eat a low fat the next few days after surgery. °2. Take your usually prescribed home medications unless otherwise directed. °3. PAIN CONTROL: °a. Pain is best controlled by a usual combination of three different methods TOGETHER: °i. Ice/Heat °ii. Over the counter pain medication °iii. Prescription pain medication °b. Most patients will experience some swelling and bruising around the hernia(s) such as the bellybutton, groins, or old incisions.  Ice packs or heating pads (30-60 minutes up to 6 times a day) will help. Use ice for the first few days to help decrease swelling and bruising, then switch to heat to help relax tight/sore spots and speed recovery.  Some people prefer to use ice alone, heat alone, alternating between ice & heat.  Experiment to what works for you.  Swelling and bruising can take  several weeks to resolve.   °c. It is helpful to take an over-the-counter pain medication regularly for the first few weeks.  Choose one of the following that works best for you: °i. Naproxen (Aleve, etc)  Two 220mg tabs twice a day °ii. Ibuprofen (Advil, etc) Three 200mg tabs four times a day (every meal & bedtime) °iii. Acetaminophen (Tylenol, etc) 325-650mg four times a day (every meal & bedtime) °d. A  prescription for pain medication should be given to you upon discharge.  Take your pain medication as prescribed.  °i. If you are having problems/concerns with the prescription medicine (does not control pain, nausea, vomiting, rash, itching, etc), please call us [phone] to see if we need to switch you to a different pain medicine that will work better for you and/or control your side effect better. °ii. If you need a refill on your pain medication, please contact your pharmacy.  They will contact our office to request authorization. Prescriptions will not be filled after 5 pm or on week-ends. °4. Avoid getting constipated.  Between the surgery and the pain medications, it is common to experience some constipation.  Increasing fluid intake and taking a fiber supplement (such as Metamucil, Citrucel, FiberCon, MiraLax, etc) 1-2 times a day regularly will usually help prevent this problem from occurring.  A mild laxative (prune juice, Milk of Magnesia, MiraLax, etc) should be taken according to package directions if there are no bowel movements after 48 hours.   °5. Wash / shower every day.  You may shower over the dressings as they are waterproof.   °6. Remove   your waterproof bandages 5 days after surgery.  You may leave the incision open to air.  You may replace a dressing/Band-Aid to cover the incision for comfort if you wish.  Continue to shower over incision(s) after the dressing is off. ° ° ° °7. ACTIVITIES as tolerated:   °a. You may resume regular (light) daily activities beginning the next day--such  as daily self-care, walking, climbing stairs--gradually increasing activities as tolerated.  If you can walk 30 minutes without difficulty, it is safe to try more intense activity such as jogging, treadmill, bicycling, low-impact aerobics, swimming, etc. °b. Save the most intensive and strenuous activity for last such as sit-ups, heavy lifting, contact sports, etc  Refrain from any heavy lifting or straining until you are off narcotics for pain control.   °c. DO NOT PUSH THROUGH PAIN.  Let pain be your guide: If it hurts to do something, don't do it.  Pain is your body warning you to avoid that activity for another week until the pain goes down. °d. You may drive when you are no longer taking prescription pain medication, you can comfortably wear a seatbelt, and you can safely maneuver your car and apply brakes. °e. You may have sexual intercourse when it is comfortable.  °8. FOLLOW UP in our office °a. Please call [facility] at [phone] to set up an appointment to see your surgeon in the office for a follow-up appointment approximately 2-3 weeks after your surgery. °b. Make sure that you call for this appointment the day you arrive home to insure a convenient appointment time. °9.  IF YOU HAVE DISABILITY OR FAMILY LEAVE FORMS, BRING THEM TO THE OFFICE FOR PROCESSING.  DO NOT GIVE THEM TO YOUR DOCTOR. ° °WHEN TO CALL US [phone]: °1. Poor pain control °2. Reactions / problems with new medications (rash/itching, nausea, etc)  °3. Fever over 101.5 F (38.5 C) °4. Inability to urinate °5. Nausea and/or vomiting °6. Worsening swelling or bruising °7. Continued bleeding from incision. °8. Increased pain, redness, or drainage from the incision ° ° The clinic staff is available to answer your questions during regular business hours (8:30am-5pm).  Please don’t hesitate to call and ask to speak to one of our nurses for clinical concerns.  ° If you have a medical emergency, go to the nearest emergency room or call  911. ° A surgeon from [facility] is always on call at the hospitals in [place] ° °[facility] °[address], [address], [place], [place]  [address] ? ° [address] [address], [place],    [address] °MAIN: [phone] ? TOLL FREE: [phone] ? FAX: [phone] °[email or web address] ° ° °

## 2016-03-01 NOTE — Anesthesia Postprocedure Evaluation (Signed)
Anesthesia Post Note  Patient: [name].  Procedure(s) Performed: Procedure(s) (LRB): LAPAROSCOPIC BILATERAL INGUINAL AND FEMORAL HERNIA REPAIR WITH PRIMARY REPAIR OF UMBILICAL HERNIA (Bilateral) INSERTION OF MESH (Bilateral)  Patient location during evaluation: PACU Anesthesia Type: General Level of consciousness: awake, oriented and patient cooperative Pain management: pain level controlled Vital Signs Assessment: post-procedure vital signs reviewed and stable Respiratory status: spontaneous breathing, nonlabored ventilation, respiratory function stable and patient connected to nasal cannula oxygen Cardiovascular status: blood pressure returned to baseline and stable Postop Assessment: no signs of nausea or vomiting Anesthetic complications: no    Last Vitals:  Vitals:   03/01/16 1620 03/01/16 1626  BP: (!) 174/66 (!) 184/57  Pulse: 78   Resp: 16   Temp: 36.5 C     Last Pain:  Vitals:   03/01/16 1657  TempSrc:   PainSc: Asleep                 Lateia Fraser,JAMES TERRILL

## 2016-03-02 DIAGNOSIS — K4021 Bilateral inguinal hernia, without obstruction or gangrene, recurrent: Secondary | ICD-10-CM | POA: Diagnosis not present

## 2016-03-02 LAB — BASIC METABOLIC PANEL
Anion gap: 8 (ref 5–15)
BUN: 17 mg/dL (ref 6–20)
CHLORIDE: 100 mmol/L — AB (ref 101–111)
CO2: 26 mmol/L (ref 22–32)
CREATININE: 1.09 mg/dL (ref 0.61–1.24)
Calcium: 8.4 mg/dL — ABNORMAL LOW (ref 8.9–10.3)
GFR calc Af Amer: >60 mL/min (ref >60)
GFR calc non Af Amer: >60 mL/min (ref >60)
Glucose, Bld: 128 mg/dL — ABNORMAL HIGH (ref 65–99)
POTASSIUM: 4 mmol/L (ref 3.5–5.1)
SODIUM: 134 mmol/L — AB (ref 135–145)

## 2016-03-02 LAB — MAGNESIUM: MAGNESIUM: 2.1 mg/dL (ref 1.7–2.4)

## 2016-03-02 LAB — PHOSPHORUS: Phosphorus: 3.4 mg/dL (ref 2.5–4.6)

## 2016-03-02 NOTE — Discharge Summary (Signed)
Physician Discharge Summary  Patient ID: [name]. MRN: [id number] DOB/AGE: [birth year] 82 y.o.  Admit date: 03/01/2016 Discharge date: 03/02/2016  Patient Care Team: Binnie Rail, MD as PCP - General (Internal Medicine) Minus Breeding, MD as Consulting Physician (Cardiology) Deneise Lever, MD as Consulting Physician (Pulmonary Disease) Irene Shipper, MD as Consulting Physician (Gastroenterology) Jari Pigg, MD as Consulting Physician (Dermatology) Nani Ravens, MD as Referring Physician (Dermatology) Michael Boston, MD as Consulting Physician (General Surgery)  Admission Diagnoses: Principal Problem:   Recurrent bilateral inguinal hernia s/p lap repair with mesh 03/01/2016 Active Problems:   Asthma with COPD ([place])   GERD   Chronic diastolic heart failure ([place])   PAF (paroxysmal atrial fibrillation) ([place])   Generalized anxiety disorder   Essential hypertension   Bilateral Femoral hernias s/p lap repair with mesh [id number]   Umbilical hernia s/p primary repair 03/01/2016   Discharge Diagnoses:  Principal Problem:   Recurrent bilateral inguinal hernia s/p lap repair with mesh 03/01/2016 Active Problems:   Asthma with COPD ([place])   GERD   Chronic diastolic heart failure (HCC)   PAF (paroxysmal atrial fibrillation) ([place])   Generalized anxiety disorder   Essential hypertension   Bilateral Femoral hernias s/p lap repair with mesh [id number]   Umbilical hernia s/p primary repair 03/01/2016   PRE-OPERATIVE DIAGNOSIS:    BILATERAL INGUINAL HERNIA, recurrent Umbilical hernia  POST-OPERATIVE DIAGNOSIS:    BILATERAL INGUINAL HERNIA, RECURRENT  BILATERAL FEMORAL HERNIAS Umbilical hernia  PROCEDURE:    LAPAROSCOPIC BILATERAL INGUINAL AND FEMORAL HERNIA REPAIRs PRIMARY REPAIR OF UMBILICAL HERNIA INSERTION OF MESH  SURGEON:  Surgeon(s): Michael Boston, MD  Consults: None  Hospital Course:   The patient underwent the surgery above.   Postoperatively, the patient gradually mobilized and advanced to a solid diet.  Pain and other symptoms were treated aggressively.  He did have some mild tremors the morning of postop day one.  After eating and confirming lab values were within normal limits, he felt better.  Walking the hallways much better.  By the time of discharge, the patient was walking well the hallways, eating food, having flatus.  Pain was well-controlled on an oral medications.  Based on meeting discharge criteria and continuing to recover, I felt it was safe for the patient to be discharged from the hospital to further recover with close followup. Postoperative recommendations were discussed in detail.  They are written as well.   Significant Diagnostic Studies:  Results for orders placed or performed during the hospital encounter of 03/01/16 (from the past 72 hour(s))  Protime-INR     Status: None   Collection Time: 03/01/16 10:40 AM  Result Value Ref Range   Prothrombin Time 12.5 11.4 - 15.2 seconds   INR 0.94     No results found.  Discharge Exam: Blood pressure (!) 144/59, pulse 76, temperature 98.8 F (37.1 C), temperature source Oral, resp. rate 16, height 5\' 2"  (1.575 m), weight 65.8 kg (145 lb), SpO2 95 %.  General: Pt awake/alert/oriented x4 in no major acute distress Eyes: PERRL, normal EOM. Sclera nonicteric Neuro: CN II-XII intact w/o focal sensory/motor deficits. Lymph: No head/neck/groin lymphadenopathy Psych:  No delerium/psychosis/paranoia HENT: Normocephalic, Mucus membranes moist.  No thrush Neck: Supple, No tracheal deviation Chest: No pain.  Good respiratory excursion. CV:  Pulses intact.  Regular rhythm MS: Normal AROM mjr joints.  No obvious deformity Abdomen: Soft, Nondistended.  Min tender at umbilicus.  No incarcerated hernias. Ext:  SCDs BLE.  No significant  edema.  No cyanosis Skin: No petechiae / purpura  Discharged Condition: good (for him)   Past Medical History:   Diagnosis Date  . Arthritis   . ASTHMA   . Benign liver cyst   . CAD (coronary artery disease)    a. s/p CABG;  b. cath 06/27/10: S-Dx occluded, S-PDA 80-90% (tx with PCI); S-OM ok, L-LAD ok;  EF 65-70%  c.  s/p Promus DES to S-PDA 06/2010;   d. Cath 8 2016 LIMA to the LAD patent, SVG to posterior lateral subtotal, SVG to diagonal occluded chronically. The patient had stenting of the native right vessel.  . Cataract    surgery to both eyes  . Chronic diastolic CHF (congestive heart failure), NYHA class 2 ([place])   . CKD (chronic kidney disease), stage II   . COPD (chronic obstructive pulmonary disease) ([place])   . Depression with anxiety   . Diverticulosis   . DYSLIPIDEMIA   . Esophageal stricture   . Essential hypertension    Echo 3/12: EF 55-60%; mod LVH; mild AS/AI; LAE; PASP 38; mild pulmo HTN  . GERD    with HH, hx esophageal stricture  . Glaucoma   . Hiatal hernia   . Osteoporosis   . PAF (paroxysmal atrial fibrillation) ([place])   . Personal history of alcoholism ([place])    quit etoh and abstinent since age 18.   Marland Kitchen Pneumonia 04/2013 hosp  . Skin cancer    L forearm  . SLEEP APNEA 09/2001   NPSG AHI 22/HR  . Subclavian arterial stenosis (H[facility])    a. s/p PTCA of subclavian 2016.    Past Surgical History:  Procedure Laterality Date  . CARDIAC CATHETERIZATION N/A 11/26/2014   Procedure: Left Heart Cath and Cors/Grafts Angiography;  Surgeon: Sherren Mocha, MD;  Location: [facility];  Service: Cardiovascular;  Laterality: N/A;  . CARDIAC CATHETERIZATION N/A 11/26/2014   Procedure: Coronary Stent Intervention;  Surgeon: Sherren Mocha, MD;  Location: [facility];  Service: Cardiovascular;  Laterality: N/A;  . CATARACT EXTRACTION, BILATERAL  2007  . CHOLECYSTECTOMY  02/21/2011   Procedure: LAPAROSCOPIC CHOLECYSTECTOMY WITH INTRAOPERATIVE CHOLANGIOGRAM;  Surgeon: Pedro Earls, MD;  Location: [facility];  Service: General;  Laterality: N/A;  . CORONARY ANGIOPLASTY WITH STENT  PLACEMENT  07/2010  . CORONARY ARTERY BYPASS GRAFT  02/2007  . FLEXIBLE SIGMOIDOSCOPY N/A 11/05/2015   Procedure: FLEXIBLE SIGMOIDOSCOPY;  Surgeon: Jerene Bears, MD;  [facility];  Service: Endoscopy;  Laterality: N/A;  . HERNIA REPAIR  unsure ?60's  . PERIPHERAL VASCULAR CATHETERIZATION N/A 02/22/2015   Procedure: Upper Extremity Angiography;  Surgeon: Lorretta Harp, MD;  Location: [facility];  Service: Cardiovascular;  Laterality: N/A;  . TONSILLECTOMY      Social History   Social History  . Marital status: Married    Spouse name: N/A  . Number of children: 2  . Years of education: N/A   Occupational History  . retired Retired    68 years   Social History Main Topics  . Smoking status: Former Smoker    Packs/day: 3.00    Years: 40.00    Types: Cigarettes    Quit date: 04/11/1979  . Smokeless tobacco: Never Used  . Alcohol use No     Comment: quit drinking 40+ years  . Drug use: No  . Sexual activity: Yes   Other Topics Concern  . 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 Married lives with wife 2 children   Served 3 yrs in [employer], enterred service age 48, deployed to [place] age 69.      Family History  Problem Relation Age of Onset  . COPD Sister   . Asthma Sister   . Emphysema Sister   . Hypertension Sister   . Hyperlipidemia Sister   . Hypertension Mother   . Heart disease Mother   . Hyperlipidemia Mother   . Heart disease Father   . Hyperlipidemia Father   . Hypertension Father   . Heart disease Brother   . Colon cancer Neg Hx     Current Facility-Administered Medications  Medication Dose Route Frequency Provider Last Rate Last Dose  . 0.9 %  sodium chloride infusion  250 mL Intravenous PRN Michael Boston, MD      . acetaminophen (TYLENOL) tablet 1,000 mg  1,000 mg Oral Q8H Michael Boston, MD   1,000 mg at 03/02/16 0526  . albuterol (PROVENTIL) (2.5 MG/3ML) 0.083% nebulizer solution 3 mL  3 mL  Inhalation Q6H PRN Michael Boston, MD      . bisacodyl (DULCOLAX) suppository 10 mg  10 mg Rectal Q12H PRN Michael Boston, MD      . bisoprolol (ZEBETA) tablet 10 mg  10 mg Oral BID Michael Boston, MD   10 mg at 03/01/16 2224  . diphenhydrAMINE (BENADRYL) 12.5 MG/5ML elixir 12.5 mg  12.5 mg Oral Q6H PRN Michael Boston, MD       Or  . diphenhydrAMINE (BENADRYL) injection 12.5 mg  12.5 mg Intravenous Q6H PRN Michael Boston, MD      . enoxaparin (LOVENOX) injection 40 mg  40 mg Subcutaneous Q24H Michael Boston, MD   40 mg at 03/02/16 0809  . hydrALAZINE (APRESOLINE) injection 10 mg  10 mg Intravenous Q2H PRN Michael Boston, MD      . HYDROmorphone (DILAUDID) injection 0.5-2 mg  0.5-2 mg Intravenous Q2H PRN Michael Boston, MD      . ipratropium-albuterol (DUONEB) 0.5-2.5 (3) MG/3ML nebulizer solution 3 mL  3 mL Nebulization TID PRN Michael Boston, MD      . irbesartan (AVAPRO) tablet 75 mg  75 mg Oral Daily Michael Boston, MD      . isosorbide mononitrate (IMDUR) 24 hr tablet 60 mg  60 mg Oral BID Michael Boston, MD   60 mg at 03/01/16 2224  . lactated ringers bolus 1,000 mL  1,000 mL Intravenous Q8H PRN Michael Boston, MD      . latanoprost (XALATAN) 0.005 % ophthalmic solution 1 drop  1 drop Both Eyes QHS Michael Boston, MD   1 drop at 03/01/16 2225  . lip balm (CARMEX) ointment 1 application  1 application Topical BID Michael Boston, MD   1 application at [phone]  . LORazepam (ATIVAN) injection 0.5-1 mg  0.5-1 mg Intravenous Q8H PRN Michael Boston, MD      . LORazepam (ATIVAN) tablet 0.5 mg  0.5 mg Oral BID Michael Boston, MD      . magic mouthwash  15 mL Oral QID PRN Michael Boston, MD      . menthol-cetylpyridinium (CEPACOL) lozenge 3 mg  1 lozenge Oral PRN Michael Boston, MD      . methocarbamol (ROBAXIN) tablet 500 mg  500 mg Oral Q6H PRN Michael Boston, MD      . metoprolol (LOPRESSOR) injection 5 mg  5 mg Intravenous Q6H PRN Michael Boston, MD      . metoprolol tartrate (LOPRESSOR) tablet 12.5 mg  12.5 mg Oral Q12H PRN  Michael Boston, MD      . mometasone-formoterol [facility]) 100-5 MCG/ACT inhaler 2 puff  2 puff Inhalation QPM Michael Boston, MD      . montelukast (SINGULAIR) tablet 10 mg  10 mg Oral QHS Michael Boston, MD   10 mg at 03/01/16 2224  . multivitamin with minerals tablet 1 tablet  1 tablet Oral Daily Michael Boston, MD      . nitroGLYCERIN (NITROSTAT) SL tablet 0.4 mg  0.4 mg Sublingual Q5 min PRN Michael Boston, MD      . ondansetron (ZOFRAN-ODT) disintegrating tablet 4 mg  4 mg Oral Q6H PRN Michael Boston, MD       Or  . ondansetron [facility]) injection 4 mg  4 mg Intravenous Q6H PRN Michael Boston, MD   4 mg at 03/01/16 1802  . pantoprazole (PROTONIX) EC tablet 40 mg  40 mg Oral Daily Michael Boston, MD      . phenol (CHLORASEPTIC) mouth spray 2 spray  2 spray Mouth/Throat PRN Michael Boston, MD      . polyethylene glycol (MIRALAX / GLYCOLAX) packet 17 g  17 g Oral Daily Michael Boston, MD      . polyethylene glycol (MIRALAX / GLYCOLAX) packet 17 g  17 g Oral Q12H PRN Michael Boston, MD      . pravastatin (PRAVACHOL) tablet 80 mg  80 mg Oral q morning - 10a Michael Boston, MD      . prochlorperazine (COMPAZINE) injection 5-10 mg  5-10 mg Intravenous Q4H PRN Michael Boston, MD      . simethicone [facility]) chewable tablet 40 mg  40 mg Oral Q6H PRN Michael Boston, MD      . sodium chloride flush (NS) 0.9 % injection 3 mL  3 mL Intravenous Q12H Michael Boston, MD      . sodium chloride flush (NS) 0.9 % injection 3 mL  3 mL Intravenous PRN Michael Boston, MD      . torsemide (DEMADEX) tablet 10 mg  10 mg Oral Daily Michael Boston, MD      . traMADol Veatrice Bourbon) tablet 50-100 mg  50-100 mg Oral Q6H PRN Michael Boston, MD   50 mg at 03/02/16 0430  . vitamin C (ASCORBIC ACID) tablet 500 mg  500 mg Oral BID Michael Boston, MD   500 mg at 03/01/16 2224     Allergies  Allergen Reactions  . Ace Inhibitors Other (See Comments)    Severe asthma (COPD).  Valsartan OK  . Lidocaine Other (See Comments)    Possible reaction? Dizziness, confusion,  syncope  . Morphine And Related Nausea And Vomiting  . Avelox [Moxifloxacin Hcl In Nacl] Other (See Comments)    Gum pain  . Codeine Nausea And Vomiting    Tramadol fine  . Lasix [Furosemide] Itching and Swelling  . Other Other (See Comments)    Maple trees, allergy symptoms  . Tape Other (See Comments)    Adhesive and tape reaction-tears skin off     Disposition: 01-Home or Self Care  Discharge Instructions    Call MD for:    Complete by:  As directed    Temperature > 101.68F   Call MD for:  extreme fatigue    Complete by:  As directed    Call MD for:  hives    Complete by:  As directed    Call MD for:  persistant nausea and vomiting    Complete by:  As directed    Call  MD for:  redness, tenderness, or signs of infection (pain, swelling, redness, odor or green/yellow discharge around incision site)    Complete by:  As directed    Call MD for:  severe uncontrolled pain    Complete by:  As directed    Diet - low sodium heart healthy    Complete by:  As directed    Start with bland, low residue diet for a few days, then advance to a heart healthy (low fat, high fiber) diet.  If you feel nauseated or constipated, simplify to a liquid only diet for 48 hours until you are feeling better (no more nausea, farting/passing gas, having a bowel movement, etc...).  If you cannot tolerate even drinking liquids, or feeling worse, let your surgeon know or go to the Emergency Department for help.   Discharge instructions    Complete by:  As directed    Please see discharge instruction sheets.   Also refer to any handouts/printouts that may have been given from the [facility] surgery office (if you visited [place] there before surgery) Please call our office if you have any questions or concerns [phone]   Discharge wound care:    Complete by:  As directed    If you have closed incisions: Shower and bathe over these incisions with soap and water every day.  It is OK to wash over the dressings: they  are waterproof. Remove all surgical dressings on postoperative day #3.  You do not need to replace dressings over the closed incisions unless you feel more comfortable with a Band-Aid covering it.   If you have an open wound: That requires packing, so please see wound care instructions.   In general, remove all dressings, wash wound with soap and water and then replace with saline moistened gauze.  Do the dressing change at least every day.    Please call our office [phone] if you have further questions.   Driving Restrictions    Complete by:  As directed    No driving until off narcotics and can safely swerve away without pain during an emergency   Increase activity slowly    Complete by:  As directed    Lifting restrictions    Complete by:  As directed    Avoid heavy lifting initially, <20 pounds at first.   Do not push through pain.   You have no specific weight limit: If it hurts to do, DON'T DO IT.    If you feel no pain, you are not injuring anything.  Pain will protect you from injury.   Coughing and sneezing are far more stressful to your incision than any lifting.   Avoid resuming heavy lifting (>50 pounds) or other intense activity until off all narcotic pain medications.   When want to exercise more, give yourself 2 weeks to gradually get back to full intense exercise/activity.   May shower / Bathe    Complete by:  As directed    [place].  It is fine for dressings or wounds to be washed/rinsed.  Use gentle soap & water.  This will help the incisions and/or wounds get clean & minimize infection.   May walk up steps    Complete by:  As directed    Sexual Activity Restrictions    Complete by:  As directed    Sexual activity as tolerated.  Do not push through pain.  Pain will protect you from injury.   Walk with assistance  Complete by:  As directed    Walk over an hour a day.  May use a walker/cane/companion to help with balance and stamina.        Medication List    STOP taking these medications   traMADol 50 MG tablet Commonly known as:  ULTRAM     TAKE these medications   albuterol 108 (90 Base) MCG/ACT inhaler Commonly known as:  PROAIR HFA Inhale 2 puffs into the lungs every 6 (six) hours as needed for wheezing or shortness of breath.   apixaban 5 MG Tabs tablet Commonly known as:  ELIQUIS Take 1 tablet (5 mg total) by mouth 2 (two) times daily.   bisoprolol 10 MG tablet Commonly known as:  ZEBETA Take 10 mg by mouth 2 (two) times daily.   dexlansoprazole 60 MG capsule Commonly known as:  DEXILANT Take 1 capsule (60 mg total) by mouth daily. What changed:  when to take this   ipratropium-albuterol 0.5-2.5 (3) MG/3ML Soln Commonly known as:  DUONEB Take 3 mLs by nebulization 3 (three) times daily as needed (for wheezing/shortness of breath). Twice daily schedule.   isosorbide mononitrate 60 MG 24 hr tablet Commonly known as:  IMDUR Take 1 tablet (60 mg total) by mouth 2 (two) times daily.   latanoprost 0.005 % ophthalmic solution Commonly known as:  XALATAN Place 1 drop into both eyes at bedtime.   LORazepam 0.5 MG tablet Commonly known as:  ATIVAN take 1 tablet by mouth three times a day What changed:  See the new instructions.   mometasone-formoterol 100-5 MCG/ACT Aero Commonly known as:  DULERA Inhale 2 puffs into the lungs every evening.   montelukast 10 MG tablet Commonly known as:  SINGULAIR take 1 tablet by mouth at bedtime   multivitamin with minerals Tabs tablet Take 1 tablet by mouth daily. One-A-Day   nitroGLYCERIN 0.4 MG SL tablet Commonly known as:  NITROSTAT Place 1 tablet (0.4 mg total) under the tongue every 5 (five) minutes as needed for chest pain.   pravastatin 80 MG tablet Commonly known as:  PRAVACHOL Take 1 tablet (80 mg total) by mouth every morning.   torsemide 20 MG tablet Commonly known as:  DEMADEX Take half tablet every other day. What changed:  how much to  take  how to take this  when to take this  additional instructions   valsartan 320 MG tablet Commonly known as:  DIOVAN Take 1 tablet (320 mg total) by mouth daily.      Follow-up Information    Kamaria Lucia C., MD. Schedule an appointment as soon as possible for a visit in 3 weeks.   Specialty:  General Surgery Why:  To follow up after your operation, To follow up after your hospital stay Contact information: [place] [address] [phone]            Signed: Morton Peters, M.D., F.A.C.S. Gastrointestinal and Minimally Invasive Surgery [facility] [address], [place], [place] [address] [phone] Main / Paging   03/02/2016, 8:49 AM

## 2016-03-02 NOTE — Progress Notes (Signed)
[address] [place], [place] 999-26-5244 Phone: [phone] FAX: [phone]   [name]. [id number] Aug 11, 1930  CARE TEAM:  PCP: Binnie Rail, MD  Outpatient Care Team: Patient Care Team: Binnie Rail, MD as PCP - General (Internal Medicine) Minus Breeding, MD as Consulting Physician (Cardiology) Deneise Lever, MD as Consulting Physician (Pulmonary Disease) Irene Shipper, MD as Consulting Physician (Gastroenterology) Jari Pigg, MD as Consulting Physician (Dermatology) Nani Ravens, MD as Referring Physician (Dermatology) Michael Boston, MD as Consulting Physician (General Surgery)  Inpatient Treatment Team: Treatment Team: Attending Provider: Michael Boston, MD; Technician: Sueanne Margarita, NT; Technician: Resa Miner Spaugh, NT; Registered Nurse: Mertha Baars, RN  Problem List:   Principal Problem:   Recurrent bilateral inguinal hernia s/p lap repair with mesh 03/01/2016 Active Problems:   Asthma with COPD ([place])   GERD   Chronic diastolic heart failure ([place])   PAF (paroxysmal atrial fibrillation) ([place])   Generalized anxiety disorder   Essential hypertension   Bilateral Femoral hernias s/p lap repair with mesh [id number]   Umbilical hernia s/p primary repair 03/01/2016   1 Day Post-Op  03/01/2016  Procedure(s): LAPAROSCOPIC BILATERAL INGUINAL AND FEMORAL HERNIA REPAIR WITH PRIMARY REPAIR OF UMBILICAL HERNIA INSERTION OF MESH   Assessment  Waking up more  Plan:  -solid diet -PO pain control -VTE prophylaxis- SCDs, etc -mobilize as tolerated to help recovery  D/C patient from hospital when patient meets criteria (anticipate in later today):  Tolerating oral intake well Ambulating well Adequate pain control without IV medications Urinating  Having flatus Disposition planning in place   [name], M.D., F.A.C.S. Gastrointestinal and Minimally Invasive  Surgery [facility] [address] [place], [place] [address] [phone] Main / Paging   03/02/2016  Subjective:  Sleepy Daughter at bedside Tol solids Voided  Objective:  Vital signs:  Vitals:   03/01/16 1934 03/01/16 2034 03/02/16 0130 03/02/16 0604  BP: (!) 170/53 (!) 184/62 132/79 (!) 144/59  Pulse: 83 86 81 76  Resp: 18 18 18 16   Temp: (!) 96.4 F (35.8 C) 98 F (36.7 C) 98.6 F (37 C) 98.8 F (37.1 C)  TempSrc: Axillary Oral Oral Oral  SpO2: 97% 97% 96% 95%  Weight:      Height:        Last BM Date: 03/01/16  Intake/Output   Yesterday:  11/22 0701 - 11/23 0700 In: 1500 [I.V.:1300; IV Piggyback:200] Out: 600 [Urine:550; Blood:50] This shift:  Total I/O In: 48 [P.O.:60] Out: -   Bowel function:  Flatus: YES  BM:  No  Drain: (No drain)   Physical Exam:  General: Pt awake/alert/oriented x4 in No acute distress Eyes: PERRL, normal EOM.  Sclera clear.  No icterus Neuro: CN II-XII intact w/o focal sensory/motor deficits. Lymph: No head/neck/groin lymphadenopathy Psych:  No delerium/psychosis/paranoia HENT: Normocephalic, Mucus membranes moist.  No thrush Neck: Supple, No tracheal deviation Chest:  No chest wall pain w good excursion CV:  Pulses intact.  Regular rhythm MS: Normal AROM mjr joints.  No obvious deformity Abdomen: Soft.  Nondistended.  Mildly tender at incisions only.  No evidence of peritonitis.  No incarcerated hernias. Ext:  SCDs BLE.  No mjr edema.  No cyanosis Skin: No petechiae / purpura  Results:   Labs: Results for orders placed or performed during the hospital encounter of 03/01/16 (from the past 48 hour(s))  Protime-INR  Status: None   Collection Time: 03/01/16 10:40 AM  Result Value Ref Range   Prothrombin Time 12.5 11.4 - 15.2 seconds   INR 0.94     Imaging / Studies: No results found.  Medications / Allergies: per chart  Antibiotics: Anti-infectives    Start      Dose/Rate Route Frequency Ordered Stop   03/01/16 1007  ceFAZolin (ANCEF) IVPB 2g/100 mL premix     2 g 200 mL/hr over 30 Minutes Intravenous On call to O.R. 03/01/16 1007 03/01/16 1315        Note: Portions of this report may have been transcribed using voice recognition software. Every effort was made to ensure accuracy; however, inadvertent computerized transcription errors may be present.   Any transcriptional errors that result from this process are unintentional.     [name], M.D., F.A.C.S. Gastrointestinal and Minimally Invasive Surgery [facility] [address], [place], [place] [address] [phone] Main / Paging   03/02/2016

## 2016-03-02 NOTE — Progress Notes (Signed)
Pt was given prescription and discharge orders and all questions were answered. Pt was taken to main entrance via  Wheelchair by NT.  [place]

## 2016-03-06 ENCOUNTER — Encounter (HOSPITAL_COMMUNITY): Payer: Self-pay | Admitting: Surgery

## 2016-03-22 ENCOUNTER — Other Ambulatory Visit: Payer: Self-pay | Admitting: Internal Medicine

## 2016-03-26 ENCOUNTER — Other Ambulatory Visit: Payer: Self-pay | Admitting: Internal Medicine

## 2016-03-27 ENCOUNTER — Ambulatory Visit: Payer: Medicare Other | Admitting: Internal Medicine

## 2016-04-12 ENCOUNTER — Ambulatory Visit: Payer: Medicare Other | Admitting: Internal Medicine

## 2016-04-12 NOTE — Progress Notes (Deleted)
Subjective:    Patient ID: [name]., male    DOB: [birth year], 83 y.o.   MRN: [id number]  HPI He is here for follow up.  S/p hernia surgery:  On 03/01/16 he had laparoscopic bilateral inguinal and femoral hernia repair with primary repair of umbilical hernia.  He did have mesh inserted.    Medications and allergies reviewed with patient and updated if appropriate.  Patient Active Problem List   Diagnosis Date Noted  . Recurrent bilateral inguinal hernia s/p lap repair with mesh 03/01/2016 03/01/2016  . Bilateral Femoral hernias s/p lap repair with mesh 03/01/2016 03/01/2016  . Umbilical hernia s/p primary repair 03/01/2016 03/01/2016  . Hyperglycemia 12/28/2015  . Unsteadiness 12/28/2015  . Preoperative clearance 12/23/2015  . HCAP (healthcare-associated pneumonia) 12/13/2015  . Inguinal hernia, b/l 11/11/2015  . Chronic diarrhea   . Depression with anxiety   . UTI (lower urinary tract infection) 11/03/2015  . CKD (chronic kidney disease), stage II 10/21/2015  . Symptomatic bradycardia 10/17/2015  . Chronic diastolic CHF (congestive heart failure), NYHA class 2 ([place])   . COPD (chronic obstructive pulmonary disease) ([place]) 09/23/2015  . COPD exacerbation ([place]) 09/23/2015  . Sleeping difficulties 09/08/2015  . Cerebral embolism with cerebral infarction 05/18/2015  . Coronary artery disease involving native coronary artery of native heart   . Numbness and tingling in right hand 05/17/2015  . Hyponatremia 05/17/2015  . Essential hypertension   . Intermittent confusion   . Cough 04/13/2015  . PAD (peripheral artery disease) ([place]) 02/22/2015  . Subclavian artery stenosis, left ([place]) 12/23/2014  . NSTEMI (non-ST elevated myocardial infarction) ([place])   . Hx of CABG 2008 08/31/2014  . CAD S/P S-PDA DES 2012 08/31/2014  . PVD (peripheral vascular disease) ([place]) 08/31/2014  . Murmur 08/31/2014  . Anxiety about health 08/31/2014  . Abnormal chest x-ray 03/26/2014  .  Abnormal CT scan, esophagus 01/07/2014  . Generalized anxiety disorder   . Anemia, iron deficiency   . PAF (paroxysmal atrial fibrillation) ([place]) 03/08/2013  . Chronic diastolic heart failure ([place]) 10/30/2012  . Osteoporosis/severe osteopenia 06/12/2012  . Bruit of left carotid artery 09/27/2011  . GERD 11/19/2009  . Hyperlipidemia with target LDL less than 100 10/04/2009  . ESOPHAGEAL STRICTURE 12/03/2007  . Coronary atherosclerosis 05/10/2007  . Seasonal and perennial allergic rhinitis 05/10/2007  . Asthma with COPD ([place]) 05/10/2007    Current Outpatient Prescriptions on File Prior to Visit  Medication Sig Dispense Refill  . albuterol (PROAIR HFA) 108 (90 Base) MCG/ACT inhaler Inhale 2 puffs into the lungs every 6 (six) hours as needed for wheezing or shortness of breath. 1 Inhaler 12  . apixaban (ELIQUIS) 5 MG TABS tablet Take 1 tablet (5 mg total) by mouth 2 (two) times daily. 60 tablet 11  . bisoprolol (ZEBETA) 10 MG tablet Take 10 mg by mouth 2 (two) times daily.    Marland Kitchen DEXILANT 60 MG capsule take 1 capsule by mouth once daily 30 capsule 5  . DEXILANT 60 MG capsule take 1 capsule by mouth once daily 30 capsule 5  . ipratropium-albuterol (DUONEB) 0.5-2.5 (3) MG/3ML SOLN Take 3 mLs by nebulization 3 (three) times daily as needed (for wheezing/shortness of breath). Twice daily schedule.    . isosorbide mononitrate (IMDUR) 60 MG 24 hr tablet Take 1 tablet (60 mg total) by mouth 2 (two) times daily. 180 tablet 0  . latanoprost (XALATAN) 0.005 % ophthalmic solution Place 1 drop into both eyes at bedtime.     Marland Kitchen  LORazepam (ATIVAN) 0.5 MG tablet take 1 tablet by mouth three times a day (Patient taking differently: take 1 tablet by mouth twice daily) 90 tablet 2  . mometasone-formoterol (DULERA) 100-5 MCG/ACT AERO Inhale 2 puffs into the lungs every evening.    . montelukast (SINGULAIR) 10 MG tablet take 1 tablet by mouth at bedtime 90 tablet 3  . Multiple Vitamin (MULTIVITAMIN WITH MINERALS)  TABS tablet Take 1 tablet by mouth daily. One-A-Day    . nitroGLYCERIN (NITROSTAT) 0.4 MG SL tablet Place 1 tablet (0.4 mg total) under the tongue every 5 (five) minutes as needed for chest pain. 25 tablet 0  . pravastatin (PRAVACHOL) 80 MG tablet Take 1 tablet (80 mg total) by mouth every morning. 90 tablet 3  . torsemide (DEMADEX) 20 MG tablet Take half tablet every other day. (Patient taking differently: Take 10 mg by mouth daily. ) 30 tablet 2  . valsartan (DIOVAN) 320 MG tablet Take 1 tablet (320 mg total) by mouth daily. 90 tablet 3   No current facility-administered medications on file prior to visit.     Past Medical History:  Diagnosis Date  . Arthritis   . ASTHMA   . Benign liver cyst   . CAD (coronary artery disease)    a. s/p CABG;  b. cath 06/27/10: S-Dx occluded, S-PDA 80-90% (tx with PCI); S-OM ok, L-LAD ok;  EF 65-70%  c.  s/p Promus DES to S-PDA 06/2010;   d. Cath 8 2016 LIMA to the LAD patent, SVG to posterior lateral subtotal, SVG to diagonal occluded chronically. The patient had stenting of the native right vessel.  . Cataract    surgery to both eyes  . Chronic diastolic CHF (congestive heart failure), NYHA class 2 ([place])   . CKD (chronic kidney disease), stage II   . COPD (chronic obstructive pulmonary disease) ([place])   . Depression with anxiety   . Diverticulosis   . DYSLIPIDEMIA   . Esophageal stricture   . Essential hypertension    Echo 3/12: EF 55-60%; mod LVH; mild AS/AI; LAE; PASP 38; mild pulmo HTN  . GERD    with HH, hx esophageal stricture  . Glaucoma   . Hiatal hernia   . Osteoporosis   . PAF (paroxysmal atrial fibrillation) ([place])   . Personal history of alcoholism ([place])    quit etoh and abstinent since age 74.   Marland Kitchen Pneumonia 04/2013 hosp  . Skin cancer    L forearm  . SLEEP APNEA 09/2001   NPSG AHI 22/HR  . Subclavian arterial stenosis ([facility])    a. s/p PTCA of subclavian 2016.    Past Surgical History:  Procedure Laterality Date  . CARDIAC  CATHETERIZATION N/A 11/26/2014   Procedure: Left Heart Cath and Cors/Grafts Angiography;  Surgeon: Sherren Mocha, MD;  Location: [place] [facility];  Service: Cardiovascular;  Laterality: N/A;  . CARDIAC CATHETERIZATION N/A 11/26/2014   Procedure: Coronary Stent Intervention;  Surgeon: Sherren Mocha, MD;  Location: [place] [facility];  Service: Cardiovascular;  Laterality: N/A;  . CATARACT EXTRACTION, BILATERAL  2007  . CHOLECYSTECTOMY  02/21/2011   Procedure: LAPAROSCOPIC CHOLECYSTECTOMY WITH INTRAOPERATIVE CHOLANGIOGRAM;  Surgeon: Pedro Earls, MD;  Location: [facility];  Service: General;  Laterality: N/A;  . CORONARY ANGIOPLASTY WITH STENT PLACEMENT  07/2010  . CORONARY ARTERY BYPASS GRAFT  02/2007  . FLEXIBLE SIGMOIDOSCOPY N/A 11/05/2015   Procedure: FLEXIBLE SIGMOIDOSCOPY;  Surgeon: Jerene Bears, MD;  Location: [facility];  Service: Endoscopy;  Laterality: N/A;  .  HERNIA REPAIR  unsure ?60's  . INGUINAL HERNIA REPAIR Bilateral 03/01/2016   Procedure: LAPAROSCOPIC BILATERAL INGUINAL AND FEMORAL HERNIA REPAIR WITH PRIMARY REPAIR OF UMBILICAL HERNIA;  Surgeon: Michael Boston, MD;  Location: [facility];  Service: General;  Laterality: Bilateral;  . INSERTION OF MESH Bilateral 03/01/2016   Procedure: INSERTION OF MESH;  Surgeon: Michael Boston, MD;  Location: [facility];  Service: General;  Laterality: Bilateral;  . PERIPHERAL VASCULAR CATHETERIZATION N/A 02/22/2015   Procedure: Upper Extremity Angiography;  Surgeon: Lorretta Harp, MD;  Location: [place] [facility];  Service: Cardiovascular;  Laterality: N/A;  . TONSILLECTOMY      Social History   Social History  . Marital status: Married    Spouse name: N/A  . Number of children: 2  . Years of education: N/A   Occupational History  . retired Retired    46 years   Social History Main Topics  . Smoking status: Former Smoker    Packs/day: 3.00    Years: 40.00    Types: Cigarettes    Quit date: 04/11/1979  . Smokeless tobacco: Never Used    . Alcohol use No     Comment: quit drinking 40+ years  . Drug use: No  . Sexual activity: Yes   Other Topics Concern  . 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      Family History  Problem Relation Age of Onset  . COPD Sister   . Asthma Sister   . Emphysema Sister   . Hypertension Sister   . Hyperlipidemia Sister   . Hypertension Mother   . Heart disease Mother   . Hyperlipidemia Mother   . Heart disease Father   . Hyperlipidemia Father   . Hypertension Father   . Heart disease Brother   . Colon cancer Neg Hx     Review of Systems     Objective:  There were no vitals filed for this visit. There were no vitals filed for this visit. There is no height or weight on file to calculate BMI.  Wt Readings from Last 3 Encounters:  03/01/16 145 lb (65.8 kg)  02/24/16 145 lb (65.8 kg)  02/21/16 145 lb 12.8 oz (66.1 kg)     Physical Exam        Assessment & Plan:

## 2016-04-24 ENCOUNTER — Ambulatory Visit: Payer: Medicare Other | Admitting: Internal Medicine

## 2016-04-25 ENCOUNTER — Emergency Department (HOSPITAL_COMMUNITY): Payer: PPO

## 2016-04-25 ENCOUNTER — Encounter (HOSPITAL_COMMUNITY): Payer: Self-pay | Admitting: Emergency Medicine

## 2016-04-25 ENCOUNTER — Inpatient Hospital Stay (HOSPITAL_COMMUNITY)
Admission: EM | Admit: 2016-04-25 | Discharge: 2016-05-11 | DRG: 064 | Disposition: E | Payer: PPO | Attending: Family Medicine | Admitting: Family Medicine

## 2016-04-25 DIAGNOSIS — G9340 Encephalopathy, unspecified: Secondary | ICD-10-CM | POA: Diagnosis present

## 2016-04-25 DIAGNOSIS — R55 Syncope and collapse: Secondary | ICD-10-CM | POA: Diagnosis not present

## 2016-04-25 DIAGNOSIS — K219 Gastro-esophageal reflux disease without esophagitis: Secondary | ICD-10-CM | POA: Diagnosis not present

## 2016-04-25 DIAGNOSIS — G4730 Sleep apnea, unspecified: Secondary | ICD-10-CM | POA: Diagnosis present

## 2016-04-25 DIAGNOSIS — R402431 Glasgow coma scale score 3-8, in the field [EMT or ambulance]: Secondary | ICD-10-CM | POA: Diagnosis present

## 2016-04-25 DIAGNOSIS — Z7901 Long term (current) use of anticoagulants: Secondary | ICD-10-CM | POA: Diagnosis not present

## 2016-04-25 DIAGNOSIS — W19XXXA Unspecified fall, initial encounter: Secondary | ICD-10-CM | POA: Diagnosis not present

## 2016-04-25 DIAGNOSIS — I619 Nontraumatic intracerebral hemorrhage, unspecified: Secondary | ICD-10-CM | POA: Diagnosis not present

## 2016-04-25 DIAGNOSIS — S065XAA Traumatic subdural hemorrhage with loss of consciousness status unknown, initial encounter: Secondary | ICD-10-CM | POA: Diagnosis present

## 2016-04-25 DIAGNOSIS — N182 Chronic kidney disease, stage 2 (mild): Secondary | ICD-10-CM | POA: Diagnosis present

## 2016-04-25 DIAGNOSIS — Z91048 Other nonmedicinal substance allergy status: Secondary | ICD-10-CM

## 2016-04-25 DIAGNOSIS — I739 Peripheral vascular disease, unspecified: Secondary | ICD-10-CM | POA: Diagnosis present

## 2016-04-25 DIAGNOSIS — I2510 Atherosclerotic heart disease of native coronary artery without angina pectoris: Secondary | ICD-10-CM

## 2016-04-25 DIAGNOSIS — I25810 Atherosclerosis of coronary artery bypass graft(s) without angina pectoris: Secondary | ICD-10-CM | POA: Diagnosis present

## 2016-04-25 DIAGNOSIS — I10 Essential (primary) hypertension: Secondary | ICD-10-CM | POA: Diagnosis present

## 2016-04-25 DIAGNOSIS — G935 Compression of brain: Secondary | ICD-10-CM | POA: Diagnosis not present

## 2016-04-25 DIAGNOSIS — Z885 Allergy status to narcotic agent status: Secondary | ICD-10-CM

## 2016-04-25 DIAGNOSIS — Z515 Encounter for palliative care: Secondary | ICD-10-CM

## 2016-04-25 DIAGNOSIS — Z955 Presence of coronary angioplasty implant and graft: Secondary | ICD-10-CM

## 2016-04-25 DIAGNOSIS — Z884 Allergy status to anesthetic agent status: Secondary | ICD-10-CM

## 2016-04-25 DIAGNOSIS — J9691 Respiratory failure, unspecified with hypoxia: Secondary | ICD-10-CM | POA: Diagnosis not present

## 2016-04-25 DIAGNOSIS — S065X9A Traumatic subdural hemorrhage with loss of consciousness of unspecified duration, initial encounter: Secondary | ICD-10-CM | POA: Diagnosis present

## 2016-04-25 DIAGNOSIS — I480 Paroxysmal atrial fibrillation: Secondary | ICD-10-CM | POA: Diagnosis not present

## 2016-04-25 DIAGNOSIS — M810 Age-related osteoporosis without current pathological fracture: Secondary | ICD-10-CM | POA: Diagnosis present

## 2016-04-25 DIAGNOSIS — Z888 Allergy status to other drugs, medicaments and biological substances status: Secondary | ICD-10-CM

## 2016-04-25 DIAGNOSIS — Z9861 Coronary angioplasty status: Secondary | ICD-10-CM

## 2016-04-25 DIAGNOSIS — J449 Chronic obstructive pulmonary disease, unspecified: Secondary | ICD-10-CM | POA: Diagnosis not present

## 2016-04-25 DIAGNOSIS — Z9842 Cataract extraction status, left eye: Secondary | ICD-10-CM | POA: Diagnosis not present

## 2016-04-25 DIAGNOSIS — Z825 Family history of asthma and other chronic lower respiratory diseases: Secondary | ICD-10-CM

## 2016-04-25 DIAGNOSIS — Y92019 Unspecified place in single-family (private) house as the place of occurrence of the external cause: Secondary | ICD-10-CM | POA: Diagnosis not present

## 2016-04-25 DIAGNOSIS — H409 Unspecified glaucoma: Secondary | ICD-10-CM | POA: Diagnosis present

## 2016-04-25 DIAGNOSIS — K5790 Diverticulosis of intestine, part unspecified, without perforation or abscess without bleeding: Secondary | ICD-10-CM | POA: Diagnosis present

## 2016-04-25 DIAGNOSIS — Z8673 Personal history of transient ischemic attack (TIA), and cerebral infarction without residual deficits: Secondary | ICD-10-CM

## 2016-04-25 DIAGNOSIS — I469 Cardiac arrest, cause unspecified: Secondary | ICD-10-CM | POA: Diagnosis not present

## 2016-04-25 DIAGNOSIS — Z9049 Acquired absence of other specified parts of digestive tract: Secondary | ICD-10-CM | POA: Diagnosis not present

## 2016-04-25 DIAGNOSIS — Z9841 Cataract extraction status, right eye: Secondary | ICD-10-CM | POA: Diagnosis not present

## 2016-04-25 DIAGNOSIS — Z87891 Personal history of nicotine dependence: Secondary | ICD-10-CM

## 2016-04-25 DIAGNOSIS — I130 Hypertensive heart and chronic kidney disease with heart failure and stage 1 through stage 4 chronic kidney disease, or unspecified chronic kidney disease: Secondary | ICD-10-CM | POA: Diagnosis present

## 2016-04-25 DIAGNOSIS — Z85828 Personal history of other malignant neoplasm of skin: Secondary | ICD-10-CM

## 2016-04-25 DIAGNOSIS — I629 Nontraumatic intracranial hemorrhage, unspecified: Secondary | ICD-10-CM | POA: Diagnosis not present

## 2016-04-25 DIAGNOSIS — E785 Hyperlipidemia, unspecified: Secondary | ICD-10-CM | POA: Diagnosis present

## 2016-04-25 DIAGNOSIS — D509 Iron deficiency anemia, unspecified: Secondary | ICD-10-CM | POA: Diagnosis present

## 2016-04-25 DIAGNOSIS — I5032 Chronic diastolic (congestive) heart failure: Secondary | ICD-10-CM | POA: Diagnosis present

## 2016-04-25 DIAGNOSIS — Z8249 Family history of ischemic heart disease and other diseases of the circulatory system: Secondary | ICD-10-CM

## 2016-04-25 DIAGNOSIS — S065X0A Traumatic subdural hemorrhage without loss of consciousness, initial encounter: Secondary | ICD-10-CM | POA: Diagnosis not present

## 2016-04-25 DIAGNOSIS — Z66 Do not resuscitate: Secondary | ICD-10-CM | POA: Diagnosis present

## 2016-04-25 DIAGNOSIS — R4189 Other symptoms and signs involving cognitive functions and awareness: Secondary | ICD-10-CM

## 2016-04-25 HISTORY — DX: Traumatic subdural hemorrhage with loss of consciousness status unknown, initial encounter: S06.5XAA

## 2016-04-25 HISTORY — DX: Traumatic subdural hemorrhage with loss of consciousness of unspecified duration, initial encounter: S06.5X9A

## 2016-04-25 LAB — COMPREHENSIVE METABOLIC PANEL
ALT: 15 U/L — AB (ref 17–63)
AST: 29 U/L (ref 15–41)
Albumin: 4.3 g/dL (ref 3.5–5.0)
Alkaline Phosphatase: 76 U/L (ref 38–126)
Anion gap: 14 (ref 5–15)
BILIRUBIN TOTAL: 0.7 mg/dL (ref 0.3–1.2)
BUN: 11 mg/dL (ref 6–20)
CO2: 22 mmol/L (ref 22–32)
CREATININE: 0.86 mg/dL (ref 0.61–1.24)
Calcium: 9 mg/dL (ref 8.9–10.3)
Chloride: 100 mmol/L — ABNORMAL LOW (ref 101–111)
Glucose, Bld: 268 mg/dL — ABNORMAL HIGH (ref 65–99)
POTASSIUM: 3.2 mmol/L — AB (ref 3.5–5.1)
Sodium: 136 mmol/L (ref 135–145)
TOTAL PROTEIN: 7.4 g/dL (ref 6.5–8.1)

## 2016-04-25 LAB — CBC WITH DIFFERENTIAL/PLATELET
BASOS ABS: 0 10*3/uL (ref 0.0–0.1)
Basophils Relative: 0 %
EOS PCT: 0 %
Eosinophils Absolute: 0 10*3/uL (ref 0.0–0.7)
HEMATOCRIT: 38.6 % — AB (ref 39.0–52.0)
Hemoglobin: 12.5 g/dL — ABNORMAL LOW (ref 13.0–17.0)
Lymphocytes Relative: 25 %
Lymphs Abs: 4.1 10*3/uL — ABNORMAL HIGH (ref 0.7–4.0)
MCH: 28.6 pg (ref 26.0–34.0)
MCHC: 32.4 g/dL (ref 30.0–36.0)
MCV: 88.3 fL (ref 78.0–100.0)
MONO ABS: 0.9 10*3/uL (ref 0.1–1.0)
MONOS PCT: 5 %
NEUTROS ABS: 11.5 10*3/uL — AB (ref 1.7–7.7)
Neutrophils Relative %: 70 %
Platelets: 216 10*3/uL (ref 150–400)
RBC: 4.37 MIL/uL (ref 4.22–5.81)
RDW: 15.6 % — AB (ref 11.5–15.5)
WBC: 16.5 10*3/uL — ABNORMAL HIGH (ref 4.0–10.5)

## 2016-04-25 LAB — I-STAT CG4 LACTIC ACID, ED: LACTIC ACID, VENOUS: 1.2 mmol/L (ref 0.5–1.9)

## 2016-04-25 LAB — I-STAT TROPONIN, ED: TROPONIN I, POC: 0 ng/mL (ref 0.00–0.08)

## 2016-04-25 LAB — PROTIME-INR
INR: 0.9
PROTHROMBIN TIME: 12.1 s (ref 11.4–15.2)

## 2016-04-25 MED ORDER — MANNITOL 25 % IV SOLN
50.0000 g | Freq: Once | INTRAVENOUS | Status: AC
  Administered 2016-04-25: 50 g via INTRAVENOUS
  Filled 2016-04-25: qty 200

## 2016-04-25 MED ORDER — LORAZEPAM 2 MG/ML IJ SOLN: 1.0000 mg | INTRAMUSCULAR | Status: DC | PRN

## 2016-04-25 MED ORDER — GLYCOPYRROLATE 1 MG PO TABS
1.0000 mg | ORAL_TABLET | ORAL | Status: DC | PRN
  Filled 2016-04-25: qty 1

## 2016-04-25 MED ORDER — ACETAMINOPHEN 325 MG PO TABS: 650.0000 mg | ORAL_TABLET | Freq: Four times a day (QID) | ORAL | Status: DC | PRN

## 2016-04-25 MED ORDER — ACETAMINOPHEN 650 MG RE SUPP: 650.0000 mg | Freq: Four times a day (QID) | RECTAL | Status: DC | PRN

## 2016-04-25 MED ORDER — SODIUM CHLORIDE 0.9 % IV SOLN
1.0000 mg/h | INTRAVENOUS | Status: DC
  Administered 2016-04-25: 2 mg/h via INTRAVENOUS
  Filled 2016-04-25: qty 10

## 2016-04-25 MED ORDER — BIOTENE DRY MOUTH MT LIQD
15.0000 mL | OROMUCOSAL | Status: DC | PRN
Start: 2016-04-25 — End: 2016-04-26

## 2016-04-25 MED ORDER — POLYVINYL ALCOHOL 1.4 % OP SOLN
1.0000 [drp] | Freq: Four times a day (QID) | OPHTHALMIC | Status: DC | PRN
Start: 2016-04-25 — End: 2016-04-25

## 2016-04-25 MED ORDER — MANNITOL 25 % IV SOLN: 12.5000 g | Freq: Once | INTRAVENOUS | Status: DC

## 2016-04-25 MED ORDER — NITROGLYCERIN IN D5W 200-5 MCG/ML-% IV SOLN
INTRAVENOUS | Status: AC
  Administered 2016-04-25: 5 ug
  Filled 2016-04-25: qty 250

## 2016-04-25 MED ORDER — IPRATROPIUM-ALBUTEROL 0.5-2.5 (3) MG/3ML IN SOLN
3.0000 mL | Freq: Once | RESPIRATORY_TRACT | Status: AC
  Administered 2016-04-25: 3 mL via RESPIRATORY_TRACT
  Filled 2016-04-25: qty 3

## 2016-04-25 MED ORDER — HALOPERIDOL 1 MG PO TABS: 0.5000 mg | ORAL_TABLET | ORAL | Status: DC | PRN

## 2016-04-25 MED ORDER — HALOPERIDOL LACTATE 5 MG/ML IJ SOLN: 0.5000 mg | INTRAMUSCULAR | Status: DC | PRN

## 2016-04-25 MED ORDER — CLEVIDIPINE BUTYRATE 0.5 MG/ML IV EMUL
0.0000 mg/h | INTRAVENOUS | Status: DC
  Administered 2016-04-25: 2 mg/h via INTRAVENOUS
  Filled 2016-04-25: qty 50

## 2016-04-25 MED ORDER — MIDAZOLAM BOLUS VIA INFUSION
2.0000 mg | INTRAVENOUS | Status: DC | PRN
  Filled 2016-04-25: qty 2

## 2016-04-25 MED ORDER — HALOPERIDOL LACTATE 2 MG/ML PO CONC
0.5000 mg | ORAL | Status: DC | PRN
  Filled 2016-04-25: qty 0.3

## 2016-04-25 MED ORDER — SODIUM CHLORIDE 0.9 % IV SOLN
2.0000 mg/h | INTRAVENOUS | Status: DC
  Administered 2016-04-25: 2 mg/h via INTRAVENOUS
  Filled 2016-04-25: qty 10

## 2016-04-25 MED ORDER — NITROGLYCERIN IN D5W 200-5 MCG/ML-% IV SOLN
0.0000 ug/min | Freq: Once | INTRAVENOUS | Status: AC
  Administered 2016-04-25: 5 ug/min via INTRAVENOUS

## 2016-04-25 MED ORDER — LORAZEPAM 2 MG/ML PO CONC: 1.0000 mg | ORAL | Status: DC | PRN

## 2016-04-25 MED ORDER — VANCOMYCIN HCL IN DEXTROSE 1-5 GM/200ML-% IV SOLN: 1000.0000 mg | Freq: Once | INTRAVENOUS | Status: DC

## 2016-04-25 MED ORDER — ONDANSETRON HCL 4 MG/2ML IJ SOLN: 4.0000 mg | Freq: Four times a day (QID) | INTRAMUSCULAR | Status: DC | PRN

## 2016-04-25 MED ORDER — ONDANSETRON 4 MG PO TBDP: 4.0000 mg | ORAL_TABLET | Freq: Four times a day (QID) | ORAL | Status: DC | PRN

## 2016-04-25 MED ORDER — PIPERACILLIN-TAZOBACTAM 3.375 G IVPB: 3.3750 g | Freq: Once | INTRAVENOUS | Status: DC

## 2016-04-25 MED ORDER — DIPHENHYDRAMINE HCL 50 MG/ML IJ SOLN: 12.5000 mg | INTRAMUSCULAR | Status: DC | PRN

## 2016-04-25 MED ORDER — MORPHINE SULFATE (PF) 4 MG/ML IV SOLN: 1.0000 mg | INTRAVENOUS | Status: DC | PRN

## 2016-04-25 MED ORDER — SODIUM CHLORIDE 0.9 % IV SOLN
10.0000 mg/h | INTRAVENOUS | Status: DC
  Administered 2016-04-25: 10 mg/h via INTRAVENOUS

## 2016-04-25 MED ORDER — FENTANYL BOLUS VIA INFUSION
50.0000 ug | INTRAVENOUS | Status: DC | PRN
  Filled 2016-04-25: qty 50

## 2016-04-25 MED ORDER — SODIUM CHLORIDE 0.9 % IV SOLN
100.0000 ug/h | INTRAVENOUS | Status: DC
  Administered 2016-04-25: 100 ug/h via INTRAVENOUS
  Filled 2016-04-25: qty 50

## 2016-04-25 MED ORDER — GLYCOPYRROLATE 0.2 MG/ML IJ SOLN: 0.2000 mg | INTRAMUSCULAR | Status: DC | PRN

## 2016-04-25 MED ORDER — MIDAZOLAM HCL 2 MG/2ML IJ SOLN
2.0000 mg | Freq: Once | INTRAMUSCULAR | Status: AC
  Administered 2016-04-25: 2 mg via INTRAVENOUS

## 2016-04-25 MED ORDER — LORAZEPAM 1 MG PO TABS: 1.0000 mg | ORAL_TABLET | ORAL | Status: DC | PRN

## 2016-04-25 MED ORDER — MIDAZOLAM HCL 2 MG/2ML IJ SOLN
INTRAMUSCULAR | Status: AC
  Filled 2016-04-25: qty 2

## 2016-04-25 MED ORDER — POLYVINYL ALCOHOL 1.4 % OP SOLN
1.0000 [drp] | Freq: Four times a day (QID) | OPHTHALMIC | Status: DC | PRN
  Filled 2016-04-25 (×2): qty 15

## 2016-04-30 LAB — CULTURE, BLOOD (ROUTINE X 2)
Culture: NO GROWTH
Culture: NO GROWTH

## 2016-05-11 NOTE — Progress Notes (Signed)
RN paged because pt expired at 66. Death was expected as pt was admitted under comfort care only secondary to a life- threatening SDU from a fall at home. Pt on anticoagulation for Atrial Fibrillation. Death certificate completed but not certified. Because of fall at home, case referred to medical examiner.  KJKG, NP [facility]

## 2016-05-11 NOTE — ED Notes (Signed)
Palliative care at bedside with family. Ordering new sedatives, fentanyl and versed.

## 2016-05-11 NOTE — ED Notes (Signed)
Family at bedside. Patient appears comfortable. No needs reported from family at this time.

## 2016-05-11 NOTE — ED Provider Notes (Addendum)
[facility] DEPT Provider Note   CSN: [id number] Arrival date & time: May 16, 2016  0740     History   Chief Complaint Chief Complaint  Patient presents with  . Altered Mental Status    HPI [name]. is a 83 y.o. male.  HPI  LEVEL 5 CAVEAT FOR BRAIN BLEED  I got involved in patient care at 8:30 am. Mr. Mccalip arrived unresponsive. Allegedly, Mr. Ismaili had woken up from sleep at 2:00 am with severe headaches, took excedrin, and then wife found him unresponsive this morning. Pt noted to have large intraparenchymal bleed on the R side.  Pt has unequal pupils, L sided is dilated. Responsive to light. I have called the 2 numbers we have in file - and was unsuccessful at reaching family. It seems that Mr. Packwood has suffered a non survivable bleed. I think it will be futile to intubate. We will start antihypertensives with goal SBP of 160 and give patient mannitol. We will keep trying to reach family.  Past Medical History:  Diagnosis Date  . Arthritis   . ASTHMA   . Benign liver cyst   . CAD (coronary artery disease)    a. s/p CABG;  b. cath 06/27/10: S-Dx occluded, S-PDA 80-90% (tx with PCI); S-OM ok, L-LAD ok;  EF 65-70%  c.  s/p Promus DES to S-PDA 06/2010;   d. Cath 8 2016 LIMA to the LAD patent, SVG to posterior lateral subtotal, SVG to diagonal occluded chronically. The patient had stenting of the native right vessel.  . Cataract    surgery to both eyes  . Chronic diastolic CHF (congestive heart failure), NYHA class 2 ([place])   . CKD (chronic kidney disease), stage II   . COPD (chronic obstructive pulmonary disease) ([place])   . Depression with anxiety   . Diverticulosis   . DYSLIPIDEMIA   . Esophageal stricture   . Essential hypertension    Echo 3/12: EF 55-60%; mod LVH; mild AS/AI; LAE; PASP 38; mild pulmo HTN  . GERD    with HH, hx esophageal stricture  . Glaucoma   . Hiatal hernia   . Osteoporosis   . PAF (paroxysmal atrial fibrillation) ([place])   . Personal  history of alcoholism ([place])    quit etoh and abstinent since age 47.   Marland Kitchen Pneumonia 04/2013 hosp  . Skin cancer    L forearm  . SLEEP APNEA 09/2001   NPSG AHI 22/HR  . Subclavian arterial stenosis ([facility])    a. s/p PTCA of subclavian 2016.  . Subdural hematoma ([place]) admitted May 16, 2016   likely secondary to fall and Eliquis use/notes 2016-05-16    Patient Active Problem List   Diagnosis Date Noted  . Subdural hematoma ([place]) May 16, 2016  . Intracranial bleed ([place]) May 16, 2016  . Intraparenchymal hemorrhage of brain ([place])   . Terminal care   . Palliative care encounter   . Recurrent bilateral inguinal hernia s/p lap repair with mesh 03/01/2016 03/01/2016  . Bilateral Femoral hernias s/p lap repair with mesh 03/01/2016 03/01/2016  . Umbilical hernia s/p primary repair 03/01/2016 03/01/2016  . Hyperglycemia 12/28/2015  . Unsteadiness 12/28/2015  . Preoperative clearance 12/23/2015  . HCAP (healthcare-associated pneumonia) 12/13/2015  . Inguinal hernia, b/l 11/11/2015  . Chronic diarrhea   . Depression with anxiety   . UTI (lower urinary tract infection) 11/03/2015  . CKD (chronic kidney disease), stage II 10/21/2015  . Symptomatic bradycardia 10/17/2015  . Chronic diastolic CHF (congestive heart failure), NYHA class 2 ([place])   .  COPD (chronic obstructive pulmonary disease) ([place]) 09/23/2015  . COPD exacerbation ([place]) 09/23/2015  . Sleeping difficulties 09/08/2015  . Cerebral embolism with cerebral infarction 05/18/2015  . Coronary artery disease involving native coronary artery of native heart   . Acute encephalopathy 05/17/2015  . Numbness and tingling in right hand 05/17/2015  . Hyponatremia 05/17/2015  . Essential hypertension   . Intermittent confusion   . Cough 04/13/2015  . PAD (peripheral artery disease) ([place]) 02/22/2015  . Subclavian artery stenosis, left ([place]) 12/23/2014  . NSTEMI (non-ST elevated myocardial infarction) ([place])   . Hx of CABG 2008 08/31/2014  . CAD S/P S-PDA  DES 2012 08/31/2014  . PVD (peripheral vascular disease) ([place]) 08/31/2014  . Murmur 08/31/2014  . Anxiety about health 08/31/2014  . Abnormal chest x-ray 03/26/2014  . Abnormal CT scan, esophagus 01/07/2014  . Generalized anxiety disorder   . Anemia, iron deficiency   . PAF (paroxysmal atrial fibrillation) ([place]) 03/08/2013  . Chronic diastolic heart failure ([place]) 10/30/2012  . Osteoporosis/severe osteopenia 06/12/2012  . Bruit of left carotid artery 09/27/2011  . GERD 11/19/2009  . Hyperlipidemia with target LDL less than 100 10/04/2009  . ESOPHAGEAL STRICTURE 12/03/2007  . Coronary atherosclerosis 05/10/2007  . Seasonal and perennial allergic rhinitis 05/10/2007  . Asthma with COPD ([place]) 05/10/2007    Past Surgical History:  Procedure Laterality Date  . CARDIAC CATHETERIZATION N/A 11/26/2014   Procedure: Left Heart Cath and Cors/Grafts Angiography;  Surgeon: Sherren Mocha, MD;  Location: [facility];  Service: Cardiovascular;  Laterality: N/A;  . CARDIAC CATHETERIZATION N/A 11/26/2014   Procedure: Coronary Stent Intervention;  Surgeon: Sherren Mocha, MD;  Location: [facility];  Service: Cardiovascular;  Laterality: N/A;  . CATARACT EXTRACTION, BILATERAL  2007  . CHOLECYSTECTOMY  02/21/2011   Procedure: LAPAROSCOPIC CHOLECYSTECTOMY WITH INTRAOPERATIVE CHOLANGIOGRAM;  Surgeon: Pedro Earls, MD;  Location: [facility];  Service: General;  Laterality: N/A;  . CORONARY ANGIOPLASTY WITH STENT PLACEMENT  07/2010  . CORONARY ARTERY BYPASS GRAFT  02/2007  . FLEXIBLE SIGMOIDOSCOPY N/A 11/05/2015   Procedure: FLEXIBLE SIGMOIDOSCOPY;  Surgeon: Jerene Bears, MD;  [facility];  Service: Endoscopy;  Laterality: N/A;  . HERNIA REPAIR  unsure ?60's  . INGUINAL HERNIA REPAIR Bilateral 03/01/2016   Procedure: LAPAROSCOPIC BILATERAL INGUINAL AND FEMORAL HERNIA REPAIR WITH PRIMARY REPAIR OF UMBILICAL HERNIA;  Surgeon: Michael Boston, MD;  Location: [facility];  Service: General;   Laterality: Bilateral;  . INSERTION OF MESH Bilateral 03/01/2016   Procedure: INSERTION OF MESH;  Surgeon: Michael Boston, MD;  Location: [facility];  Service: General;  Laterality: Bilateral;  . PERIPHERAL VASCULAR CATHETERIZATION N/A 02/22/2015   Procedure: Upper Extremity Angiography;  Surgeon: Lorretta Harp, MD;  Location: [facility];  Service: Cardiovascular;  Laterality: N/A;  . TONSILLECTOMY         Home Medications    Prior to Admission medications   Medication Sig Start Date End Date Taking? Authorizing Provider  albuterol (PROAIR HFA) 108 (90 Base) MCG/ACT inhaler Inhale 2 puffs into the lungs every 6 (six) hours as needed for wheezing or shortness of breath. 08/16/15  Yes Deneise Lever, MD  apixaban (ELIQUIS) 5 MG TABS tablet Take 1 tablet (5 mg total) by mouth 2 (two) times daily. 05/20/15  Yes Minus Breeding, MD  bisoprolol (ZEBETA) 10 MG tablet Take 10 mg by mouth 2 (two) times daily.   Yes Historical Provider, MD  DEXILANT 60 MG capsule take 1 capsule by mouth  once daily 03/27/16  Yes Binnie Rail, MD  ipratropium-albuterol (DUONEB) 0.5-2.5 (3) MG/3ML SOLN Take 3 mLs by nebulization 3 (three) times daily as needed (for wheezing/shortness of breath). Twice daily schedule.   Yes Historical Provider, MD  isosorbide mononitrate (IMDUR) 60 MG 24 hr tablet Take 1 tablet (60 mg total) by mouth 2 (two) times daily. 05/19/15  Yes Shanker Kristeen Mans, MD  latanoprost (XALATAN) 0.005 % ophthalmic solution Place 1 drop into both eyes at bedtime.    Yes Historical Provider, MD  LORazepam (ATIVAN) 0.5 MG tablet take 1 tablet by mouth three times a day Patient taking differently: take 1 tablet by mouth twice daily 02/15/16  Yes Binnie Rail, MD  mometasone-formoterol (DULERA) 100-5 MCG/ACT AERO Inhale 2 puffs into the lungs every evening.   Yes Historical Provider, MD  montelukast (SINGULAIR) 10 MG tablet take 1 tablet by mouth at bedtime 11/30/15  Yes Binnie Rail, MD  Multiple Vitamin  (MULTIVITAMIN WITH MINERALS) TABS tablet Take 1 tablet by mouth daily. One-A-Day   Yes Historical Provider, MD  nitroGLYCERIN (NITROSTAT) 0.4 MG SL tablet Place 1 tablet (0.4 mg total) under the tongue every 5 (five) minutes as needed for chest pain. 07/13/14  Yes Minus Breeding, MD  pravastatin (PRAVACHOL) 80 MG tablet Take 1 tablet (80 mg total) by mouth every morning. 11/25/15  Yes Minus Breeding, MD  torsemide (DEMADEX) 20 MG tablet Take half tablet every other day. Patient taking differently: Take 10 mg by mouth daily.  01/13/16  Yes Charlene Brooke Nche, NP  valsartan (DIOVAN) 320 MG tablet Take 1 tablet (320 mg total) by mouth daily. 08/10/15  Yes Binnie Rail, MD    Family History Family History  Problem Relation Age of Onset  . COPD Sister   . Asthma Sister   . Emphysema Sister   . Hypertension Sister   . Hyperlipidemia Sister   . Hypertension Mother   . Heart disease Mother   . Hyperlipidemia Mother   . Heart disease Father   . Hyperlipidemia Father   . Hypertension Father   . Heart disease Brother   . Colon cancer Neg Hx     Social History Social History  Substance Use Topics  . Smoking status: Former Smoker    Packs/day: 3.00    Years: 40.00    Types: Cigarettes    Quit date: 04/11/1979  . Smokeless tobacco: Never Used  . Alcohol use No     Comment: quit drinking 40+ years     Allergies   Ace inhibitors; Lidocaine; Morphine and related; Avelox [moxifloxacin hcl in nacl]; Codeine; Lasix [furosemide]; Other; and Tape   Review of Systems Review of Systems  Unable to perform ROS: Mental status change     Physical Exam Updated Vital Signs BP (!) 223/87 (BP Location: Right Arm)   Pulse 92   Resp 12   SpO2 99%   Physical Exam  Constitutional: He appears well-developed.  HENT:  Head: Atraumatic.  Eyes:  UNEQUAL PUPILS - L is 5 mm, R is 1 mm  Neck: Neck supple.  Cardiovascular: Normal rate.   Pulmonary/Chest: Effort normal.  Neurological:  Unresponsive    Skin: Skin is warm.  Nursing note and vitals reviewed.    ED Treatments / Results  Labs (all labs ordered are listed, but only abnormal results are displayed) Labs Reviewed  CBC WITH DIFFERENTIAL/PLATELET - Abnormal; Notable for the following:       Result Value   WBC 16.5 (*)  Hemoglobin 12.5 (*)    HCT 38.6 (*)    RDW 15.6 (*)    Neutro Abs 11.5 (*)    Lymphs Abs 4.1 (*)    All other components within normal limits  COMPREHENSIVE METABOLIC PANEL - Abnormal; Notable for the following:    Potassium 3.2 (*)    Chloride 100 (*)    Glucose, Bld 268 (*)    ALT 15 (*)    All other components within normal limits  CULTURE, BLOOD (ROUTINE X 2)  CULTURE, BLOOD (ROUTINE X 2)  PROTIME-INR  I-STAT CG4 LACTIC ACID, ED  I-STAT TROPOININ, ED    EKG  EKG Interpretation  Date/Time:  May 10, 2016 07:55:45 EST Ventricular Rate:  100 PR Interval:    QRS Duration: 114 QT Interval:  380 QTC Calculation: 491 R Axis:   59 Text Interpretation:  Sinus tachycardia Multiple premature complexes, vent & supraven Incomplete right bundle branch block ST depr, consider ischemia, inferior leads Borderline ST elevation, anterior leads Borderline prolonged QT interval Baseline wander in lead(s) V1 pvcs and pacs are new from prior  Confirmed by [facility]  MD, MARTHA [phone]) on 10-May-2016 8:02:57 AM       Radiology Ct Head Wo Contrast  Result Date: May 10, 2016 CLINICAL DATA:  Vomiting, unresponsive, history coronary artery disease, asthma, GERD, CHF, COPD EXAM: CT HEAD WITHOUT CONTRAST TECHNIQUE: Contiguous axial images were obtained from the base of the skull through the vertex without intravenous contrast. Sagittal and coronal MPR images reconstructed from axial data set. Scattered motion artifacts present. COMPARISON:  10/07/2011 FINDINGS: Brain: Nonstandard positioning. RIGHT temporoparietal subdural hematoma up to 17 mm thick, extending onto the RIGHT tentorium and the RIGHT posterior  aspect of the interhemispheric fissure. Compression of the RIGHT hemisphere. Additional large intra cerebral hemorrhage at the RIGHT temporal-occipital lobe, 6.0 x 6.3 x 4.3 cm in size. Extension of hemorrhage into the RIGHT lateral ventricle with additional blood seen within the LEFT lateral ventricle. Approximately 22 mm of RIGHT to LEFT midline shift with evidence of subfalcine herniation of the RIGHT lateral ventricle. Vascular: Atherosclerotic calcifications of the carotid siphons. Skull: Demineralized but intact Sinuses/Orbits: Grossly clear Other: N/A IMPRESSION: Large intra cerebral hematoma up to 6.3 cm diameter at the RIGHT temporo-occipital lobe with evidence of significant intraventricular extension and associated RIGHT temporoparietal subdural hematoma 17 mm thick. Approximately 22 mm of RIGHT to LEFT midline shift with subfalcine herniation of the RIGHT lateral ventricle. Underlying generalized atrophy with small vessel chronic ischemic changes of deep cerebral white matter. Critical Value/emergent results were called by telephone at the time of interpretation on 05-10-2016 at 8:26 am to Dr. Alfonzo Beers , who verbally acknowledged these results. Electronically Signed   By: Lavonia Dana M.D.   On: May 10, 2016 08:28     Procedures Procedures (including critical care time)  CRITICAL CARE Performed by: Varney Biles   Total critical care time: 50 minutes  Critical care time was exclusive of separately billable procedures and treating other patients.  Critical care was necessary to treat or prevent imminent or life-threatening deterioration.  Critical care was time spent personally by me on the following activities: development of treatment plan with patient and/or surrogate as well as nursing, discussions with consultants, evaluation of patient's response to treatment, examination of patient, obtaining history from patient or surrogate, ordering and performing treatments and  interventions, ordering and review of laboratory studies, ordering and review of radiographic studies, pulse oximetry and re-evaluation of patient's condition.   Medications Ordered in  ED Medications  ipratropium-albuterol (DUONEB) 0.5-2.5 (3) MG/3ML nebulizer solution 3 mL (3 mLs Nebulization Given 05/21/16 0752)  nitroGLYCERIN 50 mg in dextrose 5 % 250 mL (0.2 mg/mL) infusion (0 mcg/min Intravenous Stopped May 21, 2016 1013)  nitroGLYCERIN 0.2 mg/mL in dextrose 5 % infusion (  Stopped 21-May-2016 0800)  mannitol 25 % injection 50 g (50 g Intravenous Given 05/21/16 0845)  midazolam (VERSED) injection 2 mg (2 mg Intravenous Given 21-May-2016 1045)     Initial Impression / Assessment and Plan / ED Course  I have reviewed the triage vital signs and the nursing notes.  Pertinent labs & imaging results that were available during my care of the patient were reviewed by me and considered in my medical decision making (see chart for details).     Pt has a massive intracranial bleed. He is on blood thinners. We think the patient has a very poor prognosis at surviving this insult. I spoke with Dr. Ellene Route, Neurosurgery. Dr. Ellene Route reviewed the images, went over the presentation of patient with me and discussed the anticoagulation use. He recommends no surgical intervention, and to discuss comfort care.  I spoke with Mrs. Diebel. She reports that her husband had a fall 2 days ago. Last night, around 1 am he complained of severe headaches. Pt took tylenol. He was still having severe pain, so he too tramadol, but then started having emesis. She also noted that pt appeared confused and at times had poor spatial orientation.  After discussing the ER findings, and the neurosurgical recommendation - she is amenable to  Transitioning to comfort care. Chaplain with [place]. Palliative consulted, medicine consulted. Pt still on antihypertensive drips and mannitol was completed. Pt is now started on morphine drip as well. We will  likely discontinue the anti-hypertensive in a few minutes. Final Clinical Impressions(s) / ED Diagnoses   Final diagnoses:  Intracranial hemorrhage [facility])    New Prescriptions Discharge Medication List as of May 21, 2016 11:54 PM       Varney Biles, MD 2016/05/21 [id number]    Varney Biles, MD 2016-05-21 [place], MD 04/27/16 [id number]

## 2016-05-11 NOTE — Progress Notes (Signed)
RN notified by family that patient had stopped breathing. Time of death 84. Verified by Junius Argyle, RN and Hosie Spangle, RN. Santiago Glad. Baltazar Najjar, NP signed death certificate. Case has been referred to Medical Examiner, Cora Collum due to fall. Medical  Examiner has been notified.  Check list has been completed and patient placement notified.  Patient was on a fentanyl and Versed drip. Remainder wasted with Hosie Spangle, RN charge nurse.  Lines removed per ME request and patient taken to morgue.  Patients family requested that [employer] [facility] be notified.  Jimmie Molly, RN

## 2016-05-11 NOTE — ED Notes (Signed)
MD Nanavati speaking with wife about patient status and prognosis at this time.

## 2016-05-11 NOTE — ED Notes (Signed)
Wife at bedside. Chaplain present as well. Emotional support provided.

## 2016-05-11 NOTE — ED Notes (Signed)
Patient transporting to CT. Patient remains unresponsive. NRB in place. Posturing to pain noted.

## 2016-05-11 NOTE — ED Provider Notes (Signed)
[facility] DEPT Provider Note   CSN: [id number] Arrival date & time: 2016/05/22  0740     History   Chief Complaint Chief Complaint  Patient presents with  . Altered Mental Status    HPI [name]. is a 83 y.o. male.  HPI  A LEVEL 5 CAVEAT PERTAINS DUE TO ALTERED MENTAL STATUS.  Pt seen immediately upon arrival via EMS.  Per EMS he awoke last night at some time with a headache.  This morning wife found him unresponsive and covered in vomit.  Per EMS he initially had O2 sat in 70s on RA- up to 100% on NRB face mask.  Pt is not responsive to voice or pain.  Per EMS wife was unable to give more history.    Past Medical History:  Diagnosis Date  . Arthritis   . ASTHMA   . Benign liver cyst   . CAD (coronary artery disease)    a. s/p CABG;  b. cath 06/27/10: S-Dx occluded, S-PDA 80-90% (tx with PCI); S-OM ok, L-LAD ok;  EF 65-70%  c.  s/p Promus DES to S-PDA 06/2010;   d. Cath 8 2016 LIMA to the LAD patent, SVG to posterior lateral subtotal, SVG to diagonal occluded chronically. The patient had stenting of the native right vessel.  . Cataract    surgery to both eyes  . Chronic diastolic CHF (congestive heart failure), NYHA class 2 ([place])   . CKD (chronic kidney disease), stage II   . COPD (chronic obstructive pulmonary disease) ([place])   . Depression with anxiety   . Diverticulosis   . DYSLIPIDEMIA   . Esophageal stricture   . Essential hypertension    Echo 3/12: EF 55-60%; mod LVH; mild AS/AI; LAE; PASP 38; mild pulmo HTN  . GERD    with HH, hx esophageal stricture  . Glaucoma   . Hiatal hernia   . Osteoporosis   . PAF (paroxysmal atrial fibrillation) ([place])   . Personal history of alcoholism ([place])    quit etoh and abstinent since age 24.   Marland Kitchen Pneumonia 04/2013 hosp  . Skin cancer    L forearm  . SLEEP APNEA 09/2001   NPSG AHI 22/HR  . Subclavian arterial stenosis ([facility])    a. s/p PTCA of subclavian 2016.  . Subdural hematoma ([place]) admitted 2016/05/22   likely  secondary to fall and Eliquis use/notes 2016/05/22    Patient Active Problem List   Diagnosis Date Noted  . Subdural hematoma ([place]) 2016/05/22  . Intracranial bleed ([place]) May 22, 2016  . Intraparenchymal hemorrhage of brain ([place])   . Terminal care   . Palliative care encounter   . Recurrent bilateral inguinal hernia s/p lap repair with mesh 03/01/2016 03/01/2016  . Bilateral Femoral hernias s/p lap repair with mesh 03/01/2016 03/01/2016  . Umbilical hernia s/p primary repair 03/01/2016 03/01/2016  . Hyperglycemia 12/28/2015  . Unsteadiness 12/28/2015  . Preoperative clearance 12/23/2015  . HCAP (healthcare-associated pneumonia) 12/13/2015  . Inguinal hernia, b/l 11/11/2015  . Chronic diarrhea   . Depression with anxiety   . UTI (lower urinary tract infection) 11/03/2015  . CKD (chronic kidney disease), stage II 10/21/2015  . Symptomatic bradycardia 10/17/2015  . Chronic diastolic CHF (congestive heart failure), NYHA class 2 ([place])   . COPD (chronic obstructive pulmonary disease) ([place]) 09/23/2015  . COPD exacerbation ([place]) 09/23/2015  . Sleeping difficulties 09/08/2015  . Cerebral embolism with cerebral infarction 05/18/2015  . Coronary artery disease involving native coronary artery of native heart   .  Acute encephalopathy 05/17/2015  . Numbness and tingling in right hand 05/17/2015  . Hyponatremia 05/17/2015  . Essential hypertension   . Intermittent confusion   . Cough 04/13/2015  . PAD (peripheral artery disease) ([place]) 02/22/2015  . Subclavian artery stenosis, left ([place]) 12/23/2014  . NSTEMI (non-ST elevated myocardial infarction) ([place])   . Hx of CABG 2008 08/31/2014  . CAD S/P S-PDA DES 2012 08/31/2014  . PVD (peripheral vascular disease) ([place]) 08/31/2014  . Murmur 08/31/2014  . Anxiety about health 08/31/2014  . Abnormal chest x-ray 03/26/2014  . Abnormal CT scan, esophagus 01/07/2014  . Generalized anxiety disorder   . Anemia, iron deficiency   . PAF (paroxysmal  atrial fibrillation) ([place]) 03/08/2013  . Chronic diastolic heart failure ([place]) 10/30/2012  . Osteoporosis/severe osteopenia 06/12/2012  . Bruit of left carotid artery 09/27/2011  . GERD 11/19/2009  . Hyperlipidemia with target LDL less than 100 10/04/2009  . ESOPHAGEAL STRICTURE 12/03/2007  . Coronary atherosclerosis 05/10/2007  . Seasonal and perennial allergic rhinitis 05/10/2007  . Asthma with COPD ([place]) 05/10/2007    Past Surgical History:  Procedure Laterality Date  . CARDIAC CATHETERIZATION N/A 11/26/2014   Procedure: Left Heart Cath and Cors/Grafts Angiography;  Surgeon: Sherren Mocha, MD;  Location: [facility];  Service: Cardiovascular;  Laterality: N/A;  . CARDIAC CATHETERIZATION N/A 11/26/2014   Procedure: Coronary Stent Intervention;  Surgeon: Sherren Mocha, MD;  Location: [facility];  Service: Cardiovascular;  Laterality: N/A;  . CATARACT EXTRACTION, BILATERAL  2007  . CHOLECYSTECTOMY  02/21/2011   Procedure: LAPAROSCOPIC CHOLECYSTECTOMY WITH INTRAOPERATIVE CHOLANGIOGRAM;  Surgeon: Pedro Earls, MD;  Location: [facility];  Service: General;  Laterality: N/A;  . CORONARY ANGIOPLASTY WITH STENT PLACEMENT  07/2010  . CORONARY ARTERY BYPASS GRAFT  02/2007  . FLEXIBLE SIGMOIDOSCOPY N/A 11/05/2015   Procedure: FLEXIBLE SIGMOIDOSCOPY;  Surgeon: Jerene Bears, MD;  [facility];  Service: Endoscopy;  Laterality: N/A;  . HERNIA REPAIR  unsure ?60's  . INGUINAL HERNIA REPAIR Bilateral 03/01/2016   Procedure: LAPAROSCOPIC BILATERAL INGUINAL AND FEMORAL HERNIA REPAIR WITH PRIMARY REPAIR OF UMBILICAL HERNIA;  Surgeon: Michael Boston, MD;  Location: [facility];  Service: General;  Laterality: Bilateral;  . INSERTION OF MESH Bilateral 03/01/2016   Procedure: INSERTION OF MESH;  Surgeon: Michael Boston, MD;  Location: [facility];  Service: General;  Laterality: Bilateral;  . PERIPHERAL VASCULAR CATHETERIZATION N/A 02/22/2015   Procedure: Upper Extremity Angiography;  Surgeon:  Lorretta Harp, MD;  Location: [facility];  Service: Cardiovascular;  Laterality: N/A;  . TONSILLECTOMY         Home Medications    Prior to Admission medications   Medication Sig Start Date End Date Taking? Authorizing Provider  albuterol (PROAIR HFA) 108 (90 Base) MCG/ACT inhaler Inhale 2 puffs into the lungs every 6 (six) hours as needed for wheezing or shortness of breath. 08/16/15  Yes Deneise Lever, MD  apixaban (ELIQUIS) 5 MG TABS tablet Take 1 tablet (5 mg total) by mouth 2 (two) times daily. 05/20/15  Yes Minus Breeding, MD  bisoprolol (ZEBETA) 10 MG tablet Take 10 mg by mouth 2 (two) times daily.   Yes Historical Provider, MD  DEXILANT 60 MG capsule take 1 capsule by mouth once daily 03/27/16  Yes Binnie Rail, MD  ipratropium-albuterol (DUONEB) 0.5-2.5 (3) MG/3ML SOLN Take 3 mLs by nebulization 3 (three) times daily as needed (for wheezing/shortness of breath). Twice daily schedule.   Yes Historical Provider, MD  isosorbide mononitrate (IMDUR) 60 MG 24 hr tablet Take 1 tablet (60 mg total) by mouth 2 (two) times daily. 05/19/15  Yes Shanker Kristeen Mans, MD  latanoprost (XALATAN) 0.005 % ophthalmic solution Place 1 drop into both eyes at bedtime.    Yes Historical Provider, MD  LORazepam (ATIVAN) 0.5 MG tablet take 1 tablet by mouth three times a day Patient taking differently: take 1 tablet by mouth twice daily 02/15/16  Yes Binnie Rail, MD  mometasone-formoterol (DULERA) 100-5 MCG/ACT AERO Inhale 2 puffs into the lungs every evening.   Yes Historical Provider, MD  montelukast (SINGULAIR) 10 MG tablet take 1 tablet by mouth at bedtime 11/30/15  Yes Binnie Rail, MD  Multiple Vitamin (MULTIVITAMIN WITH MINERALS) TABS tablet Take 1 tablet by mouth daily. One-A-Day   Yes Historical Provider, MD  nitroGLYCERIN (NITROSTAT) 0.4 MG SL tablet Place 1 tablet (0.4 mg total) under the tongue every 5 (five) minutes as needed for chest pain. 07/13/14  Yes Minus Breeding, MD  pravastatin  (PRAVACHOL) 80 MG tablet Take 1 tablet (80 mg total) by mouth every morning. 11/25/15  Yes Minus Breeding, MD  torsemide (DEMADEX) 20 MG tablet Take half tablet every other day. Patient taking differently: Take 10 mg by mouth daily.  01/13/16  Yes Charlene Brooke Nche, NP  valsartan (DIOVAN) 320 MG tablet Take 1 tablet (320 mg total) by mouth daily. 08/10/15  Yes Binnie Rail, MD    Family History Family History  Problem Relation Age of Onset  . COPD Sister   . Asthma Sister   . Emphysema Sister   . Hypertension Sister   . Hyperlipidemia Sister   . Hypertension Mother   . Heart disease Mother   . Hyperlipidemia Mother   . Heart disease Father   . Hyperlipidemia Father   . Hypertension Father   . Heart disease Brother   . Colon cancer Neg Hx     Social History Social History  Substance Use Topics  . Smoking status: Former Smoker    Packs/day: 3.00    Years: 40.00    Types: Cigarettes    Quit date: 04/11/1979  . Smokeless tobacco: Never Used  . Alcohol use No     Comment: quit drinking 40+ years     Allergies   Ace inhibitors; Lidocaine; Morphine and related; Avelox [moxifloxacin hcl in nacl]; Codeine; Lasix [furosemide]; Other; and Tape   Review of Systems Review of Systems  UNABLE TO OBTAIN ROS DUE TO LEVEL 5 CAVEAT   Physical Exam Updated Vital Signs BP (!) 223/87 (BP Location: Right Arm)   Pulse 92   Resp 12   SpO2 99%  Vitals reviewed Physical Exam Physical Examination: General appearance - alert, well appearing, and in no distress Mental status - alert, oriented to person, place, and time Eyes - pupils pinpoint, nonreactive Mouth - mucous membranes dry Neck - supple, no significant adenopathy Chest - increased respiratory effort, prolonged expiratory phase, increased work of breathing Heart - tachycardic, regular rhythm, normal S1, S2, no murmurs, rubs, clicks or gallops Abdomen - soft, nontender, nondistended, no masses or organomegaly Neurological -  unresponsive to voice and pain, airway intact, spontaneous respirations Extremities - peripheral pulses normal, no pedal edema, no clubbing or cyanosis Skin - normal coloration and turgor, no rashes  ED Treatments / Results  Labs (all labs ordered are listed, but only abnormal results are displayed) Labs Reviewed  CBC WITH DIFFERENTIAL/PLATELET - Abnormal; Notable for the following:  Result Value   WBC 16.5 (*)    Hemoglobin 12.5 (*)    HCT 38.6 (*)    RDW 15.6 (*)    Neutro Abs 11.5 (*)    Lymphs Abs 4.1 (*)    All other components within normal limits  COMPREHENSIVE METABOLIC PANEL - Abnormal; Notable for the following:    Potassium 3.2 (*)    Chloride 100 (*)    Glucose, Bld 268 (*)    ALT 15 (*)    All other components within normal limits  CULTURE, BLOOD (ROUTINE X 2)  PROTIME-INR  I-STAT CG4 LACTIC ACID, ED  I-STAT TROPOININ, ED   CRITICAL CARE Performed by: Threasa Beards Total critical care time: 6minutes Critical care time was exclusive of separately billable procedures and treating other patients. Critical care was necessary to treat or prevent imminent or life-threatening deterioration. Critical care was time spent personally by me on the following activities: development of treatment plan with patient and/or surrogate as well as nursing, discussions with consultants, evaluation of patient's response to treatment, examination of patient, obtaining history from patient or surrogate, ordering and performing treatments and interventions, ordering and review of laboratory studies, ordering and review of radiographic studies, pulse oximetry and re-evaluation of patient's condition.  EKG  EKG Interpretation  Date/Time:  2016/04/29 07:55:45 EST Ventricular Rate:  100 PR Interval:    QRS Duration: 114 QT Interval:  380 QTC Calculation: 491 R Axis:   59 Text Interpretation:  Sinus tachycardia Multiple premature complexes, vent & supraven Incomplete  right bundle branch block ST depr, consider ischemia, inferior leads Borderline ST elevation, anterior leads Borderline prolonged QT interval Baseline wander in lead(s) V1 pvcs and pacs are new from prior  Confirmed by [facility]  MD, Deepa Barthel [phone]) on 04/29/2016 8:02:57 AM       Radiology Ct Head Wo Contrast  Result Date: 04/29/2016 CLINICAL DATA:  Vomiting, unresponsive, history coronary artery disease, asthma, GERD, CHF, COPD EXAM: CT HEAD WITHOUT CONTRAST TECHNIQUE: Contiguous axial images were obtained from the base of the skull through the vertex without intravenous contrast. Sagittal and coronal MPR images reconstructed from axial data set. Scattered motion artifacts present. COMPARISON:  10/07/2011 FINDINGS: Brain: Nonstandard positioning. RIGHT temporoparietal subdural hematoma up to 17 mm thick, extending onto the RIGHT tentorium and the RIGHT posterior aspect of the interhemispheric fissure. Compression of the RIGHT hemisphere. Additional large intra cerebral hemorrhage at the RIGHT temporal-occipital lobe, 6.0 x 6.3 x 4.3 cm in size. Extension of hemorrhage into the RIGHT lateral ventricle with additional blood seen within the LEFT lateral ventricle. Approximately 22 mm of RIGHT to LEFT midline shift with evidence of subfalcine herniation of the RIGHT lateral ventricle. Vascular: Atherosclerotic calcifications of the carotid siphons. Skull: Demineralized but intact Sinuses/Orbits: Grossly clear Other: N/A IMPRESSION: Large intra cerebral hematoma up to 6.3 cm diameter at the RIGHT temporo-occipital lobe with evidence of significant intraventricular extension and associated RIGHT temporoparietal subdural hematoma 17 mm thick. Approximately 22 mm of RIGHT to LEFT midline shift with subfalcine herniation of the RIGHT lateral ventricle. Underlying generalized atrophy with small vessel chronic ischemic changes of deep cerebral white matter. Critical Value/emergent results were called by telephone at the  time of interpretation on 29-Apr-2016 at 8:26 am to Dr. Alfonzo Beers , who verbally acknowledged these results. Electronically Signed   By: Lavonia Dana M.D.   On: 2016/04/29 08:28   Dg Chest Portable 1 View  Result Date: 2016-04-29 CLINICAL DATA:  Unresponsive. EXAM: PORTABLE CHEST  1 VIEW COMPARISON:  02/09/2016 FINDINGS: The cardio pericardial silhouette is enlarged. Increased interstitial opacity suggests interstitial pulmonary edema. No focal airspace consolidation. No pleural effusion. The visualized bony structures of the thorax are intact. Telemetry leads overlie the chest. IMPRESSION: Cardiomegaly with vascular congestion and probable interstitial pulmonary edema. Electronically Signed   By: Misty Stanley M.D.   On: May 13, 2016 08:43    Procedures Procedures (including critical care time)  Medications Ordered in ED Medications  haloperidol (HALDOL) tablet 0.5 mg (not administered)    Or  haloperidol (HALDOL) 2 MG/ML solution 0.5 mg (not administered)    Or  haloperidol lactate (HALDOL) injection 0.5 mg (not administered)  ondansetron (ZOFRAN-ODT) disintegrating tablet 4 mg (not administered)    Or  ondansetron (ZOFRAN) injection 4 mg (not administered)  glycopyrrolate (ROBINUL) tablet 1 mg (not administered)    Or  glycopyrrolate (ROBINUL) injection 0.2 mg (not administered)    Or  glycopyrrolate (ROBINUL) injection 0.2 mg (not administered)  antiseptic oral rinse (BIOTENE) solution 15 mL (not administered)  polyvinyl alcohol (LIQUIFILM TEARS) 1.4 % ophthalmic solution 1 drop (not administered)  acetaminophen (TYLENOL) tablet 650 mg (not administered)    Or  acetaminophen (TYLENOL) suppository 650 mg (not administered)  diphenhydrAMINE (BENADRYL) injection 12.5 mg (not administered)  fentaNYL (SUBLIMAZE) 2,500 mcg in sodium chloride 0.9 % 250 mL (10 mcg/mL) infusion (100 mcg/hr Intravenous New Bag/Given May 13, 2016 1117)  midazolam (VERSED) 50 mg in sodium chloride 0.9 % 50 mL (1  mg/mL) infusion (2 mg/hr Intravenous New Bag/Given 05-13-2016 1117)  fentaNYL (SUBLIMAZE) bolus via infusion 50 mcg (not administered)  midazolam (VERSED) bolus via infusion 2 mg (not administered)  ipratropium-albuterol (DUONEB) 0.5-2.5 (3) MG/3ML nebulizer solution 3 mL (3 mLs Nebulization Given 2016-05-13 0752)  nitroGLYCERIN 50 mg in dextrose 5 % 250 mL (0.2 mg/mL) infusion (0 mcg/min Intravenous Stopped 2016-05-13 1013)  nitroGLYCERIN 0.2 mg/mL in dextrose 5 % infusion (  Stopped 05/13/2016 0800)  mannitol 25 % injection 50 g (50 g Intravenous Given 05/13/16 0845)  midazolam (VERSED) injection 2 mg (2 mg Intravenous Given 05-13-16 1045)     Initial Impression / Assessment and Plan / ED Course  I have reviewed the triage vital signs and the nursing notes.  Pertinent labs & imaging results that were available during my care of the patient were reviewed by me and considered in my medical decision making (see chart for details).  Clinical Course   pt seen immediately upon arrival to the ED.  Pt unresponsive with spontaenous respirations.  Some increased work of breathing.  Pt sattting 100% on NRB face mask O2.  IV obtained, pt placed on monitor,  Hypertensive, nitro drip started.  Portable CXR obtained, decision made to hold on intubation until after head CT.  Awaiting wife to arrive to discuss patient's wishes and whether full code or DNR.Marland Kitchen  Concern for head bleed.   Pt remains breathing well with 100% o2 sat at time of going to head CT.    Of note, care taken over by Dr. Kathrynn Humble when I was called about CT scan results.  I was currently in a cardiac arrest and Dr. Kathrynn Humble took over care as noted in his note.     Final Clinical Impressions(s) / ED Diagnoses   Final diagnoses:  Intracranial hemorrhage [facility])    New Prescriptions Current Discharge Medication List       Alfonzo Beers, MD 05-13-16 1616

## 2016-05-11 NOTE — ED Notes (Signed)
Admitting at bedside 

## 2016-05-11 NOTE — Progress Notes (Signed)
   05-03-16 0900  Clinical Encounter Type  Visited With Family;Health care provider  Visit Type Initial;Psychological support;Spiritual support  Referral From Family;Nurse;Physician  Consult/Referral To Chaplain  Spiritual Encounters  Spiritual Needs Emotional  Stress Factors  Family Stress Factors Exhausted;Health changes;Lack of caregivers    Provided emotional support for family of pt. Transitioning to full comfort care due to brain bleed.

## 2016-05-11 NOTE — H&P (Signed)
History and Physical    [name]. [id number] DOB: 05-12-1930 DOA: 05-17-16  PCP: Binnie Rail, MD Patient coming from: home  Chief Complaint: acute encephalopathy/unresponsiveness  HPI: [name]. is a 83 y.o. male with medical history significant for CAD status post remote CABG with stent, chronic diastolic heart failure, A. Fib taking eliquis, COPD, chronic kidney disease, chronic hyponatremia, chronic diarrhea since to the emergency department with the chief complaint of acute encephalopathy to the point of unresponsiveness. Initial evaluation reveals large subdural hematoma  Information is obtained from the chart and the wife is at the bedside. Wife reports patient awakened around 2 AM complaining of a headache. She states he took some Excedrin and went back to bed. This morning wife awakened and found patient unresponsive. She also reports patient with a history of A. fib and is on blood thinner. She also states he fell 2 days ago and has experienced intermittent episodes of confusion since. EMS was called and reportedly found the patient unresponsive covered in emesis with an oxygen saturation level 76% on room air chronic [place] coma scale score of 3. Heart indicates patient unresponsive with spontaneous respirations upon presentation.    ED Course: he is afebrile hypertensive tachycardia unresponsive and somewhat hypoxic. He is provided with nonrebreather oxygen mask nitroglycerin drip and mass all.  Review of Systems: As per HPI otherwise 10 point review of systems negative.   Ambulatory Status prior to this patient ambulated with a walker suffered a fall just 2 days ago  Past Medical History:  Diagnosis Date  . Arthritis   . ASTHMA   . Benign liver cyst   . CAD (coronary artery disease)    a. s/p CABG;  b. cath 06/27/10: S-Dx occluded, S-PDA 80-90% (tx with PCI); S-OM ok, L-LAD ok;  EF 65-70%  c.  s/p Promus DES to S-PDA 06/2010;   d. Cath 8 2016 LIMA to the  LAD patent, SVG to posterior lateral subtotal, SVG to diagonal occluded chronically. The patient had stenting of the native right vessel.  . Cataract    surgery to both eyes  . Chronic diastolic CHF (congestive heart failure), NYHA class 2 ([place])   . CKD (chronic kidney disease), stage II   . COPD (chronic obstructive pulmonary disease) ([place])   . Depression with anxiety   . Diverticulosis   . DYSLIPIDEMIA   . Esophageal stricture   . Essential hypertension    Echo 3/12: EF 55-60%; mod LVH; mild AS/AI; LAE; PASP 38; mild pulmo HTN  . GERD    with HH, hx esophageal stricture  . Glaucoma   . Hiatal hernia   . Osteoporosis   . PAF (paroxysmal atrial fibrillation) ([place])   . Personal history of alcoholism ([place])    quit etoh and abstinent since age 57.   Marland Kitchen Pneumonia 04/2013 hosp  . Skin cancer    L forearm  . SLEEP APNEA 09/2001   NPSG AHI 22/HR  . Subclavian arterial stenosis ([facility])    a. s/p PTCA of subclavian 2016.    Past Surgical History:  Procedure Laterality Date  . CARDIAC CATHETERIZATION N/A 11/26/2014   Procedure: Left Heart Cath and Cors/Grafts Angiography;  Surgeon: Sherren Mocha, MD;  Location: [facility];  Service: Cardiovascular;  Laterality: N/A;  . CARDIAC CATHETERIZATION N/A 11/26/2014   Procedure: Coronary Stent Intervention;  Surgeon: Sherren Mocha, MD;  Location: [place] [facility];  Service: Cardiovascular;  Laterality: N/A;  . CATARACT EXTRACTION, BILATERAL  2007  . CHOLECYSTECTOMY  02/21/2011   Procedure: LAPAROSCOPIC CHOLECYSTECTOMY WITH INTRAOPERATIVE CHOLANGIOGRAM;  Surgeon: Pedro Earls, MD;  Location: [facility];  Service: General;  Laterality: N/A;  . CORONARY ANGIOPLASTY WITH STENT PLACEMENT  07/2010  . CORONARY ARTERY BYPASS GRAFT  02/2007  . FLEXIBLE SIGMOIDOSCOPY N/A 11/05/2015   Procedure: FLEXIBLE SIGMOIDOSCOPY;  Surgeon: Jerene Bears, MD;  Location: [facility];  Service: Endoscopy;  Laterality: N/A;  . HERNIA REPAIR  unsure ?60's  .  INGUINAL HERNIA REPAIR Bilateral 03/01/2016   Procedure: LAPAROSCOPIC BILATERAL INGUINAL AND FEMORAL HERNIA REPAIR WITH PRIMARY REPAIR OF UMBILICAL HERNIA;  Surgeon: Michael Boston, MD;  Location: [facility];  Service: General;  Laterality: Bilateral;  . INSERTION OF MESH Bilateral 03/01/2016   Procedure: INSERTION OF MESH;  Surgeon: Michael Boston, MD;  Location: [facility];  Service: General;  Laterality: Bilateral;  . PERIPHERAL VASCULAR CATHETERIZATION N/A 02/22/2015   Procedure: Upper Extremity Angiography;  Surgeon: Lorretta Harp, MD;  Location: [facility];  Service: Cardiovascular;  Laterality: N/A;  . TONSILLECTOMY      Social History   Social History  . Marital status: Married    Spouse name: N/A  . Number of children: 2  . Years of education: N/A   Occupational History  . retired Retired    37 years   Social History Main Topics  . Smoking status: Former Smoker    Packs/day: 3.00    Years: 40.00    Types: Cigarettes    Quit date: 04/11/1979  . Smokeless tobacco: Never Used  . Alcohol use No     Comment: quit drinking 40+ years  . Drug use: No  . Sexual activity: Yes   Other Topics Concern  . 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      Allergies  Allergen Reactions  . Ace Inhibitors Other (See Comments)    Severe asthma (COPD).  Valsartan OK  . Lidocaine Other (See Comments)    Possible reaction? Dizziness, confusion, syncope  . Morphine And Related Nausea And Vomiting  . Avelox [Moxifloxacin Hcl In Nacl] Other (See Comments)    Gum pain  . Codeine Nausea And Vomiting    Tramadol fine  . Lasix [Furosemide] Itching and Swelling  . Other Other (See Comments)    Maple trees, allergy symptoms  . Tape Other (See Comments)    Adhesive and tape reaction-tears skin off     Family History  Problem Relation Age  of Onset  . COPD Sister   . Asthma Sister   . Emphysema Sister   . Hypertension Sister   . Hyperlipidemia Sister   . Hypertension Mother   . Heart disease Mother   . Hyperlipidemia Mother   . Heart disease Father   . Hyperlipidemia Father   . Hypertension Father   . Heart disease Brother   . Colon cancer Neg Hx     Prior to Admission medications   Medication Sig Start Date End Date Taking? Authorizing Provider  albuterol (PROAIR HFA) 108 (90 Base) MCG/ACT inhaler Inhale 2 puffs into the lungs every 6 (six) hours as needed for wheezing or shortness of breath. 08/16/15   Deneise Lever, MD  apixaban (ELIQUIS) 5 MG TABS tablet Take 1 tablet (5 mg total) by mouth 2 (two) times daily. 05/20/15  Minus Breeding, MD  bisoprolol (ZEBETA) 10 MG tablet Take 10 mg by mouth 2 (two) times daily.    Historical Provider, MD  DEXILANT 60 MG capsule take 1 capsule by mouth once daily 03/27/16   Binnie Rail, MD  ipratropium-albuterol (DUONEB) 0.5-2.5 (3) MG/3ML SOLN Take 3 mLs by nebulization 3 (three) times daily as needed (for wheezing/shortness of breath). Twice daily schedule.    Historical Provider, MD  isosorbide mononitrate (IMDUR) 60 MG 24 hr tablet Take 1 tablet (60 mg total) by mouth 2 (two) times daily. 05/19/15   Shanker Kristeen Mans, MD  latanoprost (XALATAN) 0.005 % ophthalmic solution Place 1 drop into both eyes at bedtime.     Historical Provider, MD  LORazepam (ATIVAN) 0.5 MG tablet take 1 tablet by mouth three times a day Patient taking differently: take 1 tablet by mouth twice daily 02/15/16   Binnie Rail, MD  mometasone-formoterol (DULERA) 100-5 MCG/ACT AERO Inhale 2 puffs into the lungs every evening.    Historical Provider, MD  montelukast (SINGULAIR) 10 MG tablet take 1 tablet by mouth at bedtime 11/30/15   Binnie Rail, MD  Multiple Vitamin (MULTIVITAMIN WITH MINERALS) TABS tablet Take 1 tablet by mouth daily. One-A-Day    Historical Provider, MD  nitroGLYCERIN (NITROSTAT) 0.4 MG SL  tablet Place 1 tablet (0.4 mg total) under the tongue every 5 (five) minutes as needed for chest pain. 07/13/14   Minus Breeding, MD  pravastatin (PRAVACHOL) 80 MG tablet Take 1 tablet (80 mg total) by mouth every morning. 11/25/15   Minus Breeding, MD  torsemide (DEMADEX) 20 MG tablet Take half tablet every other day. Patient taking differently: Take 10 mg by mouth daily.  01/13/16   Flossie Buffy, NP  valsartan (DIOVAN) 320 MG tablet Take 1 tablet (320 mg total) by mouth daily. 08/10/15   Binnie Rail, MD    Physical Exam: Vitals:   2016/04/28 [id number] 04-28-16 0940 04/28/16 0950 April 28, 2016 1009  BP: 142/57 137/59 146/56 125/77  Pulse: 95 95  104  Resp: 15 14 11 14   SpO2: 100% 100% 100% 100%     General:  Appears Somewhat anxious not completely comfortable, facial grimace some lower extremity spasms  Eyes: Bilateral pupils pinpoint and nonreactive ENT:  grossly normal hearing, lips & tongue, his membranes of his mouth are pale and dry  Neck:  no LAD, masses or thyromegaly Cardiovascular:  RRR, no m/r/g. No LE edema.  Lower extremity slightly pale cool to touch Respiratory:  Mild increased work of breathing. Breath sounds distant. Somewhat coarse throughout Abdomen:  soft, ntnd, NABS Skin:  no rash or induration seen on limited exam Musculoskeletal:  grossly normal tone BUE/BLE, good ROM, no bony abnormality Psychiatric:  grossly normal mood and affect, speech fluent and appropriate, AOx3 Neurologic:  Does not respond to painful stimuli. Some facial grimacing some spasming of lower extremities   Labs on Admission: I have personally reviewed following labs and imaging studies  CBC:  Recent Labs Lab 04-28-2016 0747  WBC 16.5*  NEUTROABS 11.5*  HGB 12.5*  HCT 38.6*  MCV 88.3  PLT 123XX123   Basic Metabolic Panel:  Recent Labs Lab 04/28/2016 0747  NA 136  K 3.2*  CL 100*  CO2 22  GLUCOSE 268*  BUN 11  CREATININE 0.86  CALCIUM 9.0   GFR: CrCl cannot be calculated (Unknown ideal  weight.). Liver Function Tests:  Recent Labs Lab April 28, 2016 0747  AST 29  ALT 15*  ALKPHOS 76  BILITOT 0.7  PROT 7.4  ALBUMIN 4.3   No results for input(s): LIPASE, AMYLASE in the last 168 hours. No results for input(s): AMMONIA in the last 168 hours. Coagulation Profile:  Recent Labs Lab May 13, 2016 0747  INR 0.90   Cardiac Enzymes: No results for input(s): CKTOTAL, CKMB, CKMBINDEX, TROPONINI in the last 168 hours. BNP (last 3 results) No results for input(s): PROBNP in the last 8760 hours. HbA1C: No results for input(s): HGBA1C in the last 72 hours. CBG: No results for input(s): GLUCAP in the last 168 hours. Lipid Profile: No results for input(s): CHOL, HDL, LDLCALC, TRIG, CHOLHDL, LDLDIRECT in the last 72 hours. Thyroid Function Tests: No results for input(s): TSH, T4TOTAL, FREET4, T3FREE, THYROIDAB in the last 72 hours. Anemia Panel: No results for input(s): VITAMINB12, FOLATE, FERRITIN, TIBC, IRON, RETICCTPCT in the last 72 hours. Urine analysis:    Component Value Date/Time   COLORURINE YELLOW 02/09/2016 1410   APPEARANCEUR CLEAR 02/09/2016 1410   LABSPEC <=1.005 (A) 02/09/2016 1410   PHURINE 6.0 02/09/2016 1410   GLUCOSEU NEGATIVE 02/09/2016 1410   HGBUR TRACE-INTACT (A) 02/09/2016 1410   BILIRUBINUR NEGATIVE 02/09/2016 1410   BILIRUBINUR NEGATIVE 11/11/2015 1049   KETONESUR NEGATIVE 02/09/2016 1410   PROTEINUR NEGATIVE 12/13/2015 1255   UROBILINOGEN 0.2 02/09/2016 1410   NITRITE NEGATIVE 02/09/2016 1410   LEUKOCYTESUR NEGATIVE 02/09/2016 1410    Creatinine Clearance: CrCl cannot be calculated (Unknown ideal weight.).  Sepsis Labs: @LABRCNTIP (procalcitonin:4,lacticidven:4) )No results found for this or any previous visit (from the past 240 hour(s)).   Radiological Exams on Admission: Ct Head Wo Contrast  Result Date: 05/13/16 CLINICAL DATA:  Vomiting, unresponsive, history coronary artery disease, asthma, GERD, CHF, COPD EXAM: CT HEAD WITHOUT  CONTRAST TECHNIQUE: Contiguous axial images were obtained from the base of the skull through the vertex without intravenous contrast. Sagittal and coronal MPR images reconstructed from axial data set. Scattered motion artifacts present. COMPARISON:  10/07/2011 FINDINGS: Brain: Nonstandard positioning. RIGHT temporoparietal subdural hematoma up to 17 mm thick, extending onto the RIGHT tentorium and the RIGHT posterior aspect of the interhemispheric fissure. Compression of the RIGHT hemisphere. Additional large intra cerebral hemorrhage at the RIGHT temporal-occipital lobe, 6.0 x 6.3 x 4.3 cm in size. Extension of hemorrhage into the RIGHT lateral ventricle with additional blood seen within the LEFT lateral ventricle. Approximately 22 mm of RIGHT to LEFT midline shift with evidence of subfalcine herniation of the RIGHT lateral ventricle. Vascular: Atherosclerotic calcifications of the carotid siphons. Skull: Demineralized but intact Sinuses/Orbits: Grossly clear Other: N/A IMPRESSION: Large intra cerebral hematoma up to 6.3 cm diameter at the RIGHT temporo-occipital lobe with evidence of significant intraventricular extension and associated RIGHT temporoparietal subdural hematoma 17 mm thick. Approximately 22 mm of RIGHT to LEFT midline shift with subfalcine herniation of the RIGHT lateral ventricle. Underlying generalized atrophy with small vessel chronic ischemic changes of deep cerebral white matter. Critical Value/emergent results were called by telephone at the time of interpretation on 05/13/2016 at 8:26 am to Dr. Alfonzo Beers , who verbally acknowledged these results. Electronically Signed   By: Lavonia Dana M.D.   On: 05-13-2016 08:28   Dg Chest Portable 1 View  Result Date: 2016-05-13 CLINICAL DATA:  Unresponsive. EXAM: PORTABLE CHEST 1 VIEW COMPARISON:  02/09/2016 FINDINGS: The cardio pericardial silhouette is enlarged. Increased interstitial opacity suggests interstitial pulmonary edema. No focal  airspace consolidation. No pleural effusion. The visualized bony structures of the thorax are intact. Telemetry leads overlie the chest. IMPRESSION: Cardiomegaly with vascular congestion  and probable interstitial pulmonary edema. Electronically Signed   By: Misty Stanley M.D.   On: 2016/05/02 08:43    EKG: Independently reviewed. Sinus tachycardia Multiple premature complexes, vent & supraven Incomplete right bundle branch blockST depr, consider ischemia, inferior leads Borderline ST elevation, anterior leads Borderline prolonged QT interval Baseline wander in lead(s) V1pvcs and pacs are new from prior  Assessment/Plan Principal Problem:   Subdural hematoma (HCC) Active Problems:   Asthma with COPD ([place])   PAF (paroxysmal atrial fibrillation) (H[facility])   Anemia, iron deficiency   CAD S/P S-PDA DES 2012   Acute encephalopathy   Essential hypertension   Chronic diastolic CHF (congestive heart failure), NYHA class 2 ([facility])   CKD (chronic kidney disease), stage II   #1. Subdural hematoma likely related to recent fall in the setting of Eliquis use. CT of the head as noted above. Chart review indicates case discussed with Kristeen Miss neurosurgery by ED MD. reportedly comfort care was recommendation. Patient provided with morphine drip in the emergency department. Chaplain services family at bedside. -Admit for comfort care -End-of-life protocol -Continue morphine drip -Await palliative care recommendations  #2. Hypertension. Uncontrolled. Likely related to above. Patient provided with mannitol nitroglycerin and morphine in the emergency department.  -Comfort care only -Continue morphine drip  #3. Chronic diastolic heart failure. Appears compensated  #4. PAF. EKG as noted above. Patient's home medications that include eliquis  #5. Encephalopathy. Secondary to #1. -See #1  #6. CAD. EKG as noted above   DVT prophylaxis: none comfort care only Code Status: dnr  Family Communication: wife  at bedside  Disposition Plan:   Consults called: palliative  Admission status: obs    Radene Gunning MD [facility]  If 7PM-7AM, please contact night-coverage [email or web address] Password [facility]  05/02/16, 10:25 AM

## 2016-05-11 NOTE — Progress Notes (Signed)
   05/09/2016 0928  Clinical Encounter Type  Visited With Patient;Family;Health care provider  Visit Type Follow-up;Psychological support;Spiritual support  Consult/Referral To Chaplain  Spiritual Encounters  Spiritual Needs Prayer;Emotional  Stress Factors  Patient Stress Factors None identified  Family Stress Factors Health changes;Lack of caregivers;Loss    Chaplain assist met with dr and family to provide emotional support. Wife doesn't really understand medical information. Provided ministry of presence, prayer, and deep listening.

## 2016-05-11 NOTE — Consult Note (Signed)
Consultation Note Date: 2016-05-11   Patient Name: [name].  DOB: 07-Jan-1931  MRN: [id number]  Age / Sex: 83 y.o., male  PCP: Binnie Rail, MD Referring Physician: Waldemar Dickens, MD  Reason for Consultation: Pain control and Terminal Care for subdural hematoma, acute encephalopathy  HPI/Patient Profile: 83 y.o. male  with past medical history of CAD status post remote CABG with stent, chronic diastolic heart failure, A. Fib (taking eliquis), COPD, CKD, chronic hyponatremia, and chronic diarrhea who was admitted on 2016-05-11 with unresponsiveness, acute encephalopathy that.  According to [name] wife, [name], now at bedside, the patient awoke last night with a headache for which he took Excedrin and went back to sleep.  She awoke later to find him unresponsive.  [name] did note that her husband fell two days prior and has since had intermittent episodes of confusion.  EMS was called and determined O2 sat to be 76% RA, GCS 3. CT scan showed significant subdural hematoma likely secondary to fall and Eliquis use.  Neuro advised against surgical intervention and recommended discussion of comfort care as the patient has a very poor prognosis for survival.  Clinical Assessment and Goals of Care: Palliative PA Haynes Dage and I arrived in ED to find patient unresponsive on non-rebreather with wife, [name], and sister-in-law [name] (RN Case Manager) at beside.  Both family members voiced understanding of the patient's condition and prognosis and are in agreement with the comfort care plan.  [name] son arrived during our visit and was part of the conversation.  Primary Decision Maker:  HCPOA next-of-kin    SUMMARY OF RECOMMENDATIONS    Code Status/Advance Care Planning:  DNR  Symptom Management:   Pain: switched from morphine drip to fentanyl as patient chart indicates morphine allergy  Agitation:  started versed gtt with bolus PRN  Robinul ordered PRN for secretions.  Additional Recommendations (Limitations, Scope, Preferences):  Full Comfort Care  Psycho-social/Spiritual:   Desire for further Chaplaincy support: chaplain has seen and will continue to support patient and family  Prognosis:   Hours - Days  Discharge Planning: Anticipated Hospital Death, admitted to floor, awaiting bed      Primary Diagnoses: Present on Admission: . Anemia, iron deficiency . Asthma with COPD ([place]) . Chronic diastolic CHF (congestive heart failure), NYHA class 2 ([place]) . CKD (chronic kidney disease), stage II . Essential hypertension . PAF (paroxysmal atrial fibrillation) ([place]) . Acute encephalopathy . Subdural hematoma ([place])   I have reviewed the medical record, interviewed the patient and family, and examined the patient. The following aspects are pertinent.  Past Medical History:  Diagnosis Date  . Arthritis   . ASTHMA   . Benign liver cyst   . CAD (coronary artery disease)    a. s/p CABG;  b. cath 06/27/10: S-Dx occluded, S-PDA 80-90% (tx with PCI); S-OM ok, L-LAD ok;  EF 65-70%  c.  s/p Promus DES to S-PDA 06/2010;   d. Cath 8 2016 LIMA to the LAD patent, SVG to posterior  lateral subtotal, SVG to diagonal occluded chronically. The patient had stenting of the native right vessel.  . Cataract    surgery to both eyes  . Chronic diastolic CHF (congestive heart failure), NYHA class 2 ([place])   . CKD (chronic kidney disease), stage II   . COPD (chronic obstructive pulmonary disease) ([place])   . Depression with anxiety   . Diverticulosis   . DYSLIPIDEMIA   . Esophageal stricture   . Essential hypertension    Echo 3/12: EF 55-60%; mod LVH; mild AS/AI; LAE; PASP 38; mild pulmo HTN  . GERD    with HH, hx esophageal stricture  . Glaucoma   . Hiatal hernia   . Osteoporosis   . PAF (paroxysmal atrial fibrillation) ([place])   . Personal history of alcoholism ([place])    quit etoh and  abstinent since age 21.   Marland Kitchen Pneumonia 04/2013 hosp  . Skin cancer    L forearm  . SLEEP APNEA 09/2001   NPSG AHI 22/HR  . Subclavian arterial stenosis ([facility])    a. s/p PTCA of subclavian 2016.   Social History   Social History  . Marital status: Married    Spouse name: N/A  . Number of children: 2  . Years of education: N/A   Occupational History  . retired Retired    65 years   Social History Main Topics  . Smoking status: Former Smoker    Packs/day: 3.00    Years: 40.00    Types: Cigarettes    Quit date: 04/11/1979  . Smokeless tobacco: Never Used  . Alcohol use No     Comment: quit drinking 40+ years  . Drug use: No  . Sexual activity: Yes   Other Topics Concern  . 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 Married lives with wife 2 children   Served 3 yrs in [employer], enterred service age 82, deployed to [place] age 63.     Family History  Problem Relation Age of Onset  . COPD Sister   . Asthma Sister   . Emphysema Sister   . Hypertension Sister   . Hyperlipidemia Sister   . Hypertension Mother   . Heart disease Mother   . Hyperlipidemia Mother   . Heart disease Father   . Hyperlipidemia Father   . Hypertension Father   . Heart disease Brother   . Colon cancer Neg Hx    Scheduled Meds: Continuous Infusions: . fentaNYL infusion INTRAVENOUS 100 mcg/hr (05-16-2016 1117)  . midazolam (VERSED) infusion 2 mg/hr (05-16-16 1117)   PRN Meds:.acetaminophen **OR** acetaminophen, antiseptic oral rinse, diphenhydrAMINE, fentaNYL, glycopyrrolate **OR** glycopyrrolate **OR** glycopyrrolate, haloperidol **OR** haloperidol **OR** haloperidol lactate, midazolam, ondansetron **OR** ondansetron (ZOFRAN) IV, polyvinyl alcohol Allergies  Allergen Reactions  . Ace Inhibitors Other (See Comments)    Severe asthma (COPD).  Valsartan OK  . Lidocaine Other (See Comments)    Possible reaction? Dizziness, confusion, syncope  . Morphine And Related Nausea  And Vomiting  . Avelox [Moxifloxacin Hcl In Nacl] Other (See Comments)    Gum pain  . Codeine Nausea And Vomiting    Tramadol fine  . Lasix [Furosemide] Itching and Swelling  . Other Other (See Comments)    Maple trees, allergy symptoms  . Tape Other (See Comments)    Adhesive and tape reaction-tears skin off    Review of Systems 10 sys  Physical Exam  Constitutional: He appears well-developed.  Non-responsive, on non-rebreather mask, occasional jerking movements  of extremities  Vital Signs: BP 200/86   Pulse 99   Resp 12   SpO2 100%       SpO2: SpO2: 100 % O2 Device:SpO2: 100 % O2 Flow Rate: .   IO: Intake/output summary: No intake or output data in the 24 hours ending 30-Apr-2016 1325  LBM:   Baseline Weight:   Most recent weight:       Palliative Assessment/Data:   Flowsheet Rows   Flowsheet Row Most Recent Value  Intake Tab  Referral Department  Hospitalist  Unit at Time of Referral  ER  Palliative Care Primary Diagnosis  Neurology  Date Notified  April 30, 2016  Palliative Care Type  New Palliative care  Reason for referral  End of Life Care Assistance  Date of Admission  04-30-2016  Date first seen by Palliative Care  04/30/2016  # of days Palliative referral response time  0 Day(s)  # of days IP prior to Palliative referral  0  Clinical Assessment  Palliative Performance Scale Score  0%  Other Max Last 24 Hours  8  Psychosocial & Spiritual Assessment  Palliative Care Outcomes  Patient/Family meeting held?  Yes  Who was at the meeting?  Patient's wife and sister-in-law  Palliative Care Outcomes  Provided end of life care assistance  Palliative Care follow-up planned  No      Time In: 10:10 Time Out: 11:00 Time Total: 50 minutes Greater than 50%  of this time was spent counseling and coordinating care related to the above assessment and plan.  Signed by: Olene Floss, PA-S Imogene Burn, PA-C Palliative Medicine Pager: [phone]    Please contact  Palliative Medicine Team phone at [phone] for questions and concerns.  For individual provider: See Shea Evans

## 2016-05-11 NOTE — Care Management Note (Signed)
Case Management Note  Patient Details  Name: Carloseduardo Haggart. MRN: [id number] Date of Birth: [birth year]  Subjective/Objective:                  From home where wife activated EMS for patient "not acting himself." On EMS/Fire arrival patient unresponsive covered in emesis with spO2 at 76% on RA, GCS of 3. Wife reports patient woke up at 2-3 am with a "terrible" headache, she gave him Excedrin and tramadol without any relief.  Action/Plan: Admit status OBSERVATION (acute encephalopathy/unresponsiveness); anticipate discharge HOME WITH HOSPICE VS GIP.   Expected Discharge Date:  04/26/16               Expected Discharge Plan:  Home w Hospice Care  In-House Referral:  NA  Discharge planning Services  CM Consult  Post Acute Care Choice:  NA Choice offered to:  NA  DME Arranged:  N/A DME Agency:  NA  HH Arranged:  NA HH Agency:  NA  Status of Service:  In process, will continue to follow  If discussed at Long Length of Stay Meetings, dates discussed:    Additional Comments: -Admit for comfort care -End-of-life protocol -Continue morphine drip -Await palliative care recommendations Fuller Mandril, RN 05-05-2016, 10:41 AM

## 2016-05-11 NOTE — ED Triage Notes (Signed)
Pt to ER by [facility] from home where wife activated EMS for patient "not acting himself." On EMS/Fire arrival patient unresponsive covered in emesis with spO2 at 76% on RA, GCS of 3. Wife reports patient woke up at 2-3 am with a "terrible" headache, she gave him Excedrin and tramadol without any relief. Per EMS hypertension at 276/148, CBG 256. ST at 110. Was given 0.4 of narcan for pinpoint pupils, no response to medication.

## 2016-05-11 NOTE — Progress Notes (Signed)
Responded to referral from fellow Chaplain to continue support.  Patient is actively dying due to a brain bleed and is currently on comfort care.  Per family request I prayed with family provided empathetic listening and presence. At bedside is Patient wife Rod Holler, daughter Helene Kelp, and son Konrad Dolores.  Patient waiting on bed. Will continue support as needed.    May 09, 2016 1221  Clinical Encounter Type  Visited With Patient and family together;Health care provider  Visit Type Initial;Spiritual support  Referral From Chaplain  Spiritual Encounters  Spiritual Needs Prayer;Emotional;Grief support  Stress Factors  Family Stress Factors Exhausted;Major life changes  Jaclynn Major, [place]

## 2016-05-11 DEATH — deceased

## 2016-06-08 NOTE — Discharge Summary (Signed)
Death Summary  [name]. [id number] DOB: 1931-03-15 DOA: May 16, 2016  PCP: Binnie Rail, MD   Admit date: 2016/05/16 Date of Death: 05-16-16  Final Diagnoses:  Principal Problem:   Subdural hematoma ([place]) Active Problems:   Asthma with COPD ([place])   PAF (paroxysmal atrial fibrillation) ([facility])   Anemia, iron deficiency   CAD S/P S-PDA DES 2012   Acute encephalopathy   Essential hypertension   Chronic diastolic CHF (congestive heart failure), NYHA class 2 ([facility])   CKD (chronic kidney disease), stage II   Intracranial bleed ([facility])    History of present illness:  [name]. is a 83 y.o. male with medical history significant for CAD status post remote CABG with stent, chronic diastolic heart failure, A. Fib taking eliquis, COPD, chronic kidney disease, chronic hyponatremia, chronic diarrhea since to the emergency department with the chief complaint of acute encephalopathy to the point of unresponsiveness. Initial evaluation reveals large subdural hematoma  Information is obtained from the chart and the wife is at the bedside. Wife reports patient awakened around 2 AM complaining of a headache. She states he took some Excedrin and went back to bed. This morning wife awakened and found patient unresponsive. She also reports patient with a history of A. fib and is on blood thinner. She also states he fell 2 days ago and has experienced intermittent episodes of confusion since. EMS was called and reportedly found the patient unresponsive covered in emesis with an oxygen saturation level 76% on room air chronic [place] coma scale score of 3. Heart indicates patient unresponsive with spontaneous respirations upon presentation.    Hospital Course:  Patient evaluated emergently in the ED upon arrival was found to have life-threatening subdural hematoma on blood thinners after sustaining a fall at home. After discussions were had with family, EDP, neurosurgeon Dr. Ellene Route regarding  patient prognosis and treatment options and family opted for comfort care with expectant patient death in hospital. Patient was made comfortable and was pronounced dead at 1745 after cardiac arrest secondary to respiratory failure secondary to intracranial hemorrhaging and likely brainstem herniation.   Signed:  Jeff Mccallum J  [facility] 05/17/2016, 10:43 AM

## 2017-05-25 IMAGING — DX DG CHEST 2V
2 series · 2 of 2 positions shown · non-contrast
Comparison: 05/07/2015

CLINICAL DATA: Chest pain 2 nights ago.

EXAM:
CHEST  2 VIEW

[w chest pa]
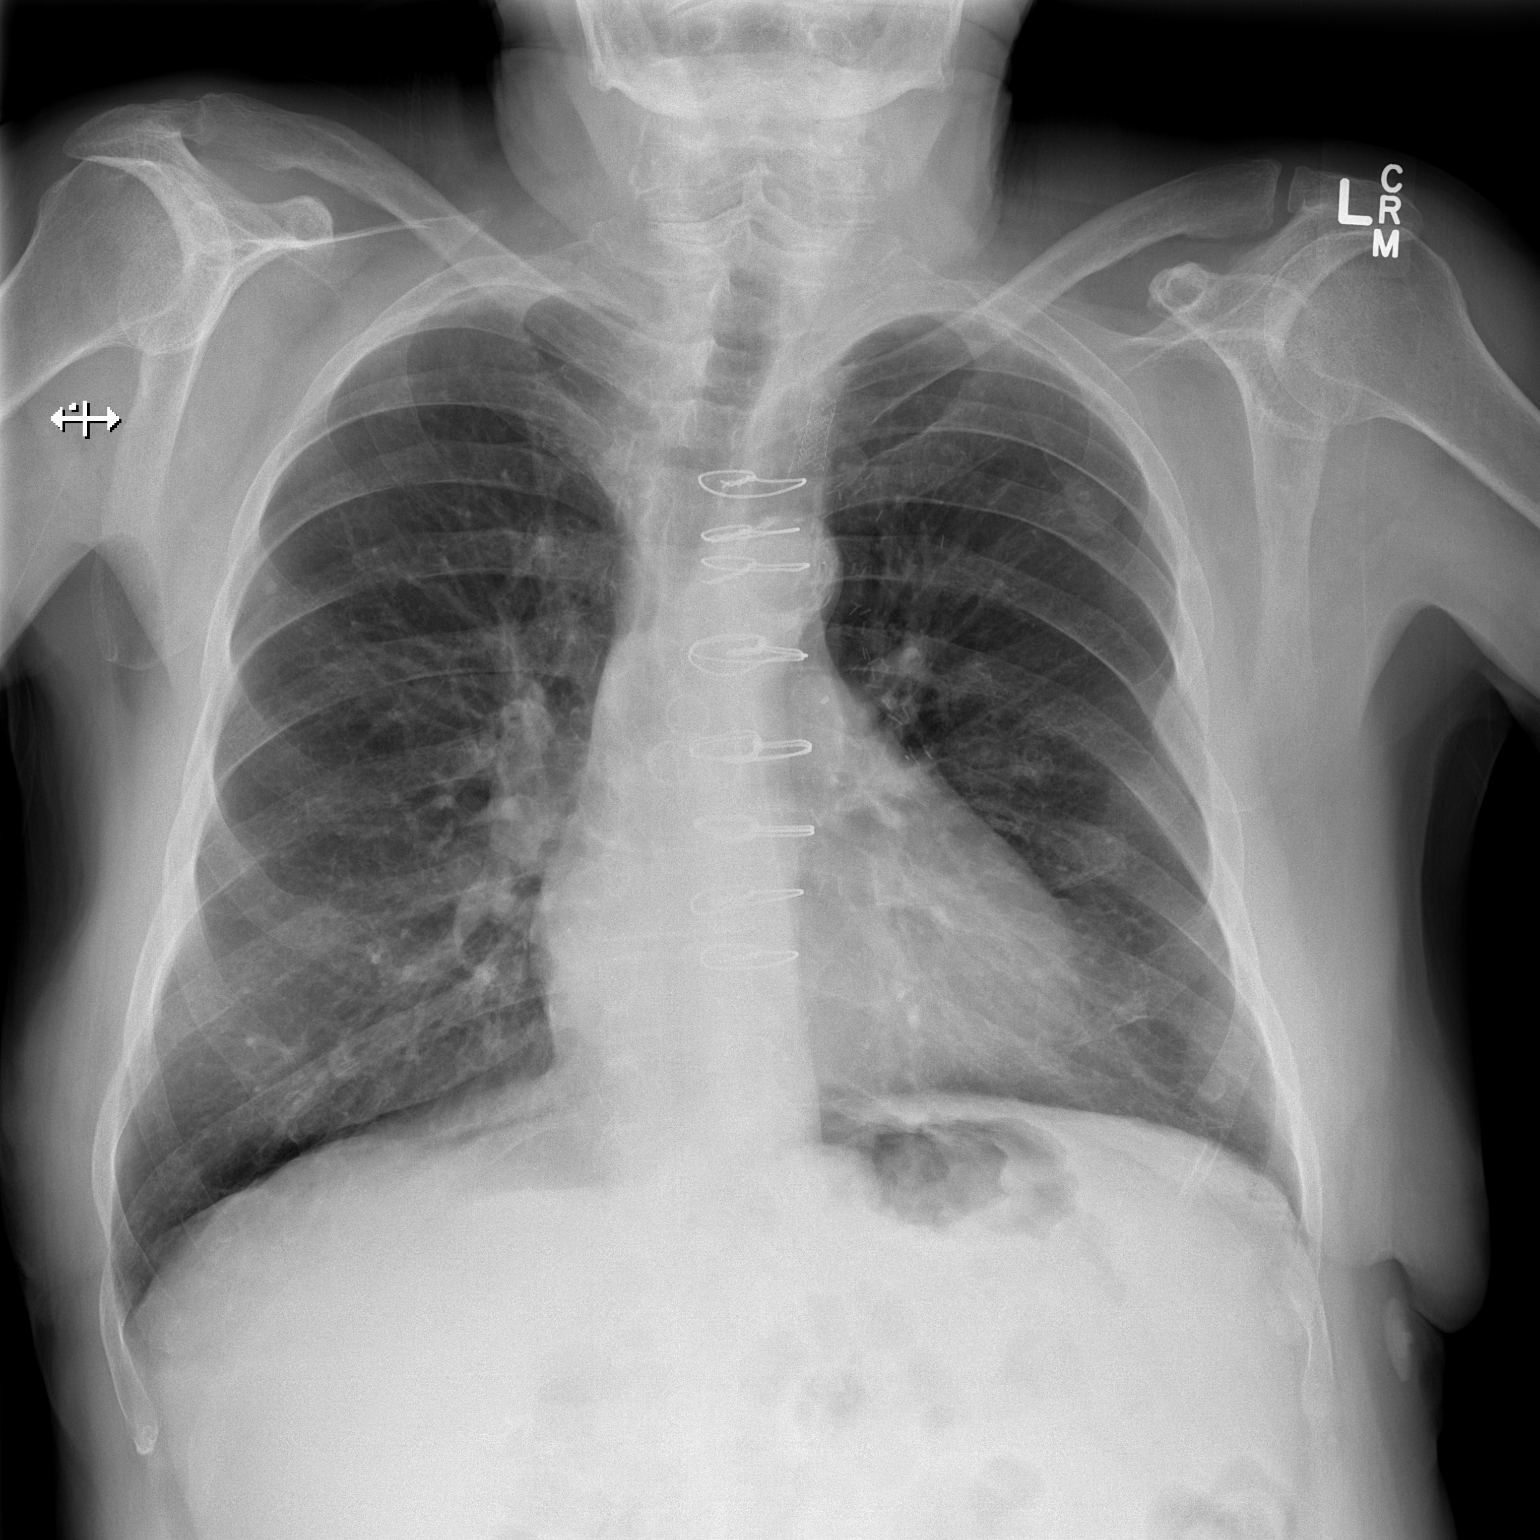

[w chest lat]
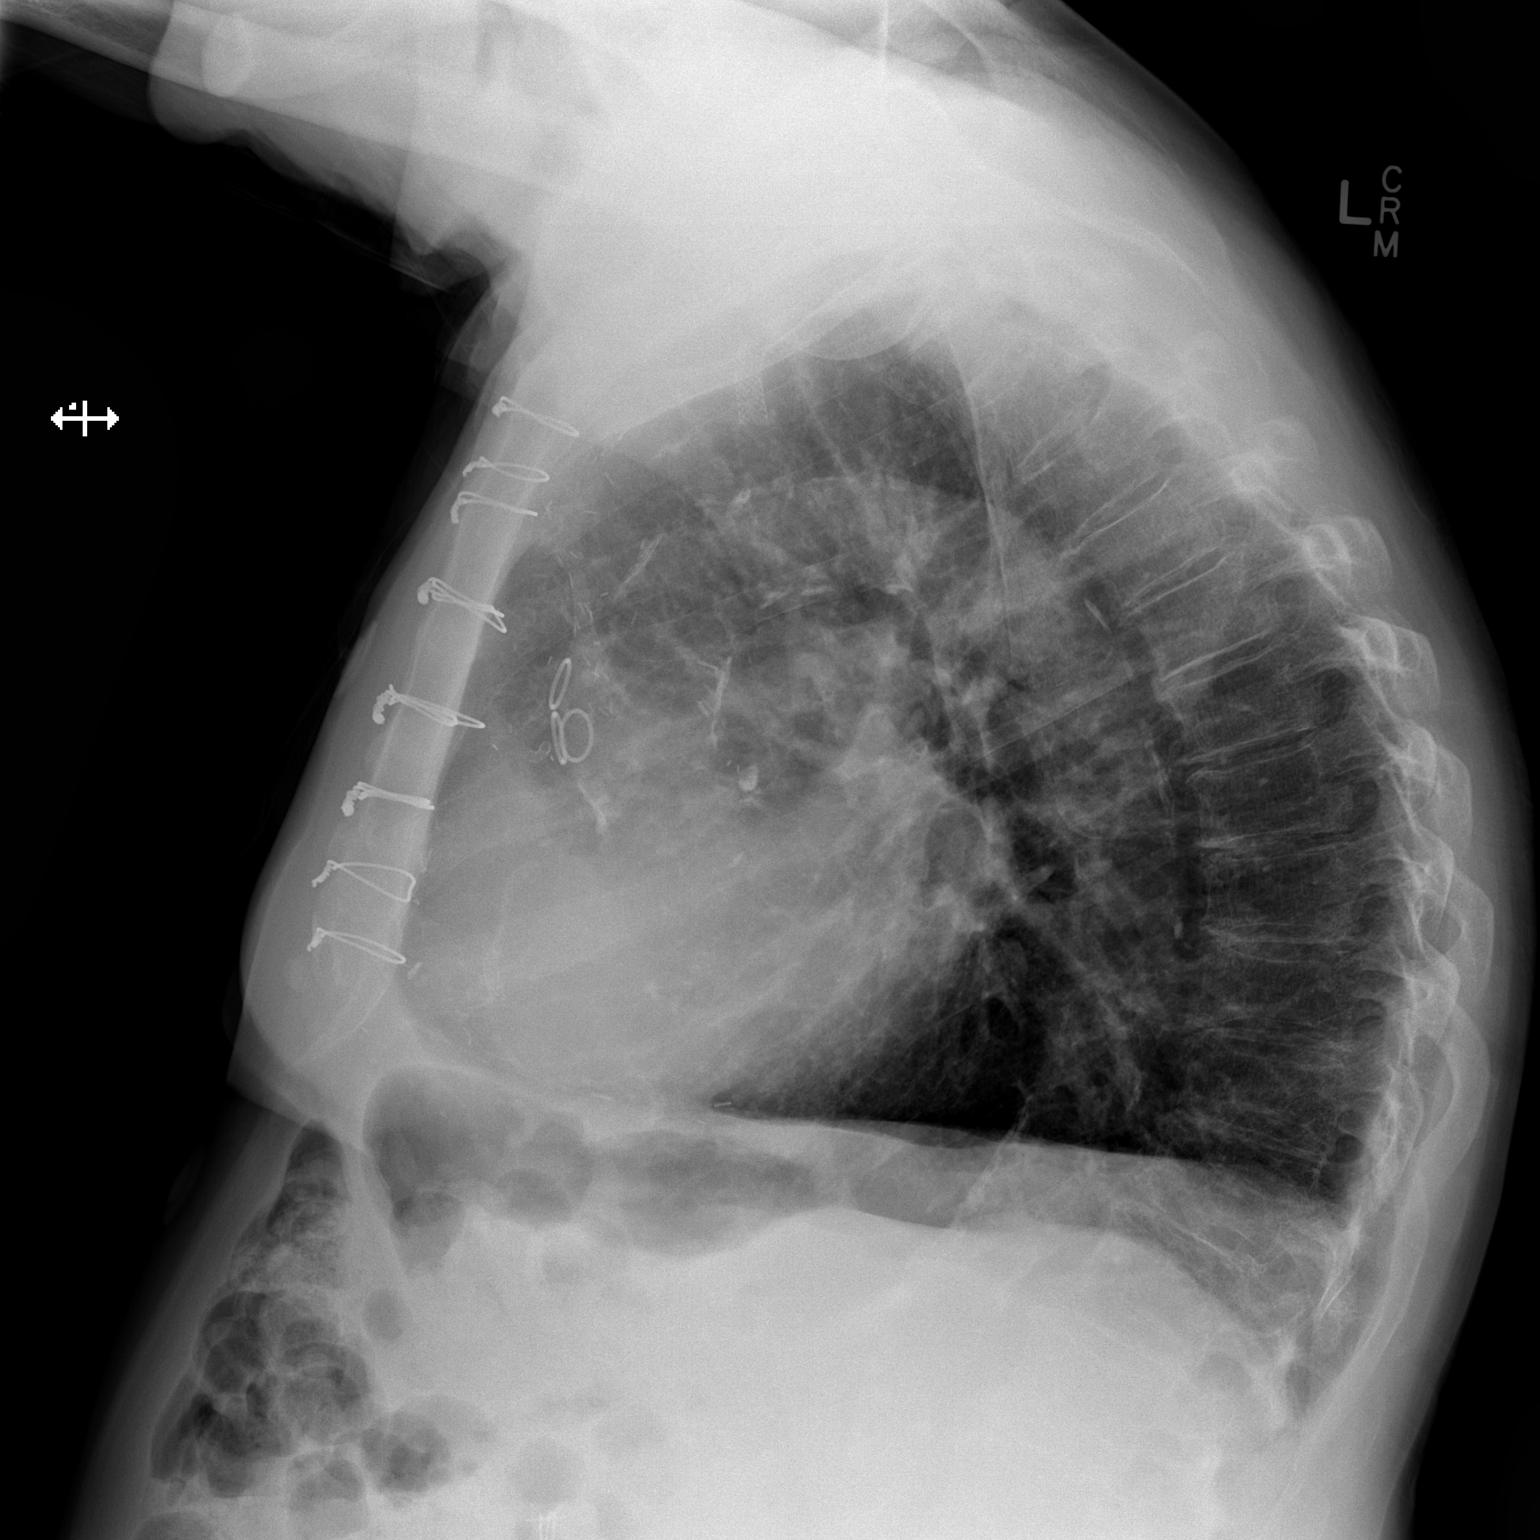

[2 of 2 positions shown; findings below may reference images not displayed]

FINDINGS: Prior CABG. Heart is upper limits normal in size. No confluent
airspace opacities or effusions. Calcifications in the aorta. No
acute bony abnormality.
IMPRESSION: No active cardiopulmonary disease.

## 2017-09-30 IMAGING — DX DG CHEST 2V
2 series · 2 of 2 positions shown · non-contrast
Comparison: 05/17/2015

CLINICAL DATA: Shortness of breath, wheezing, tachycardia

EXAM:
CHEST  2 VIEW

[w chest pa]
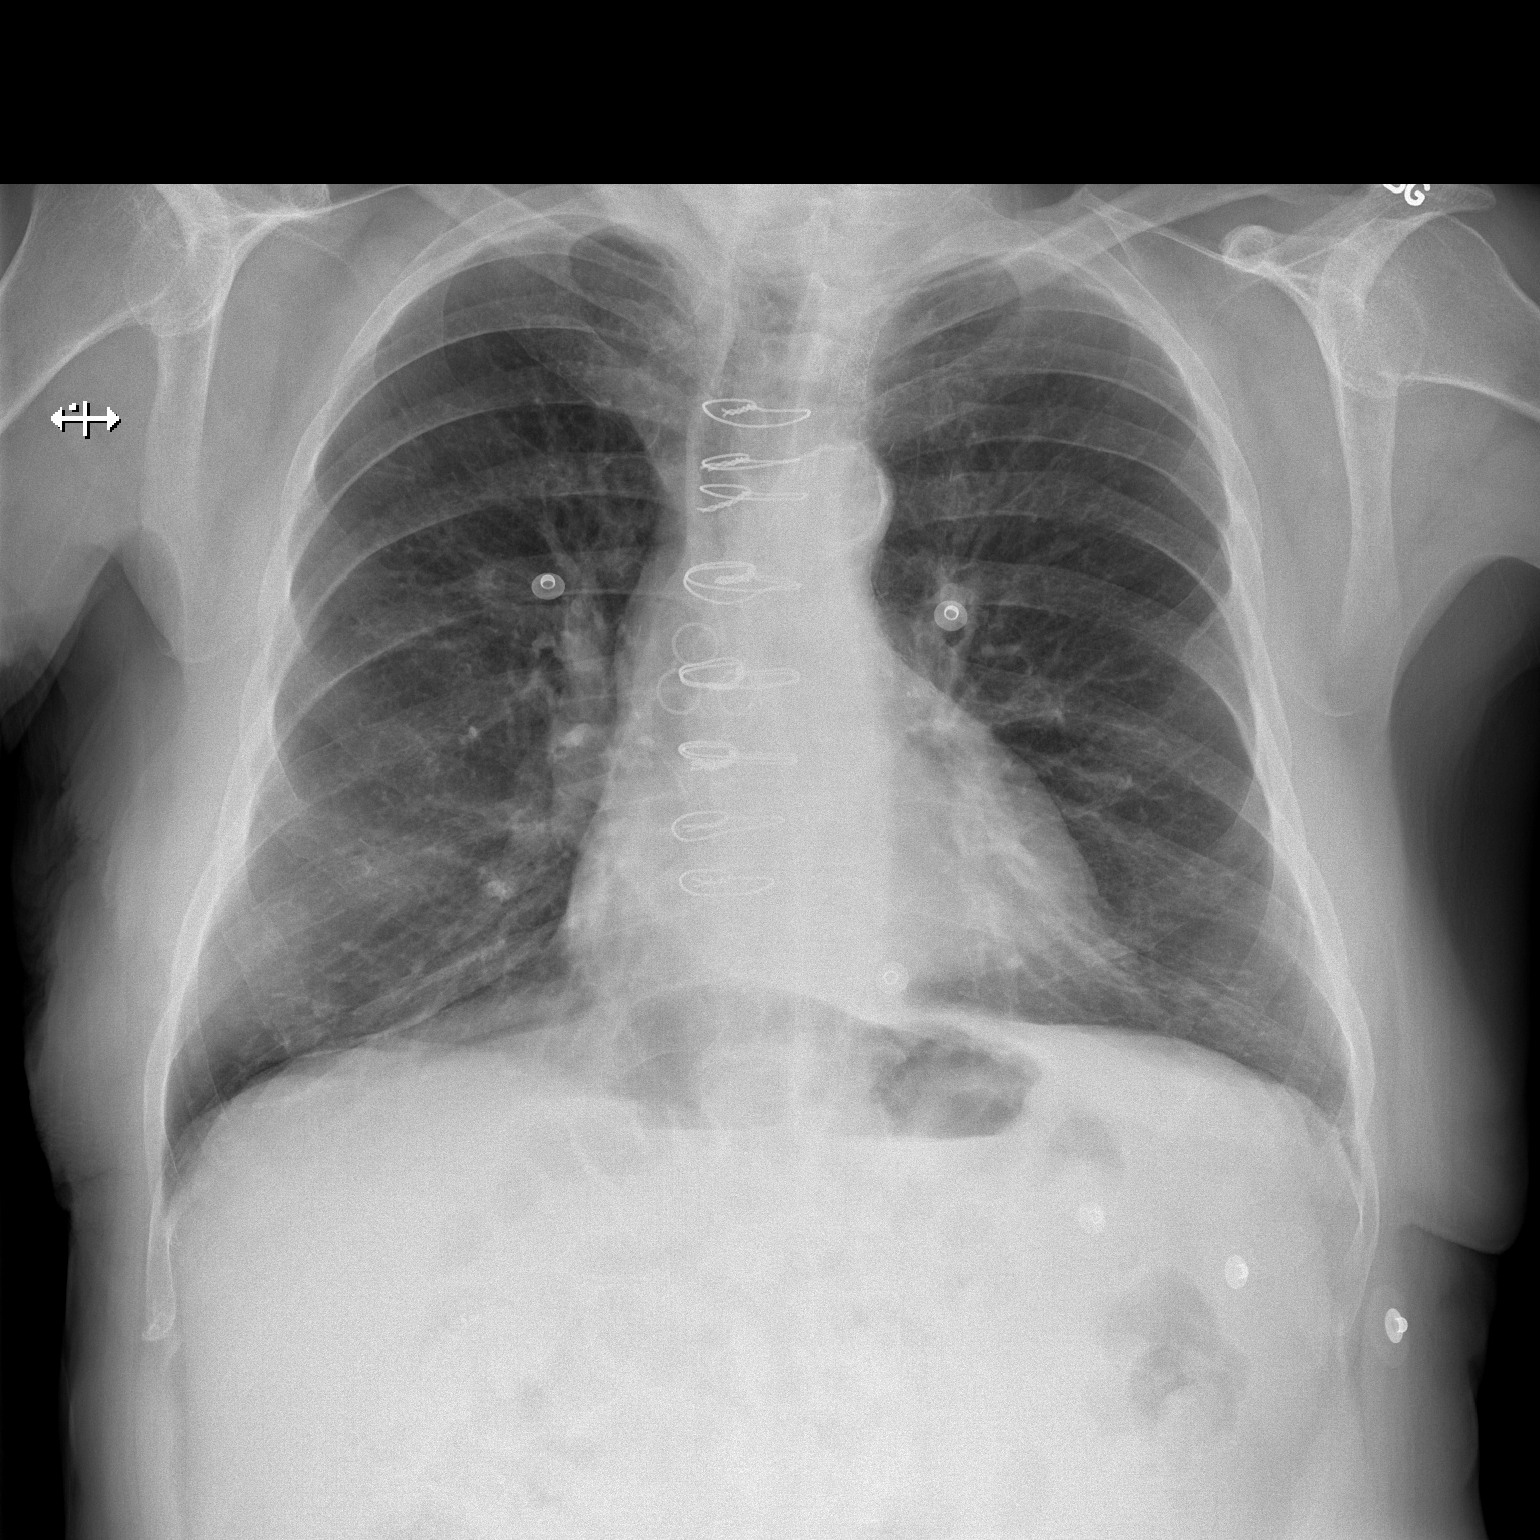

[w chest lat]
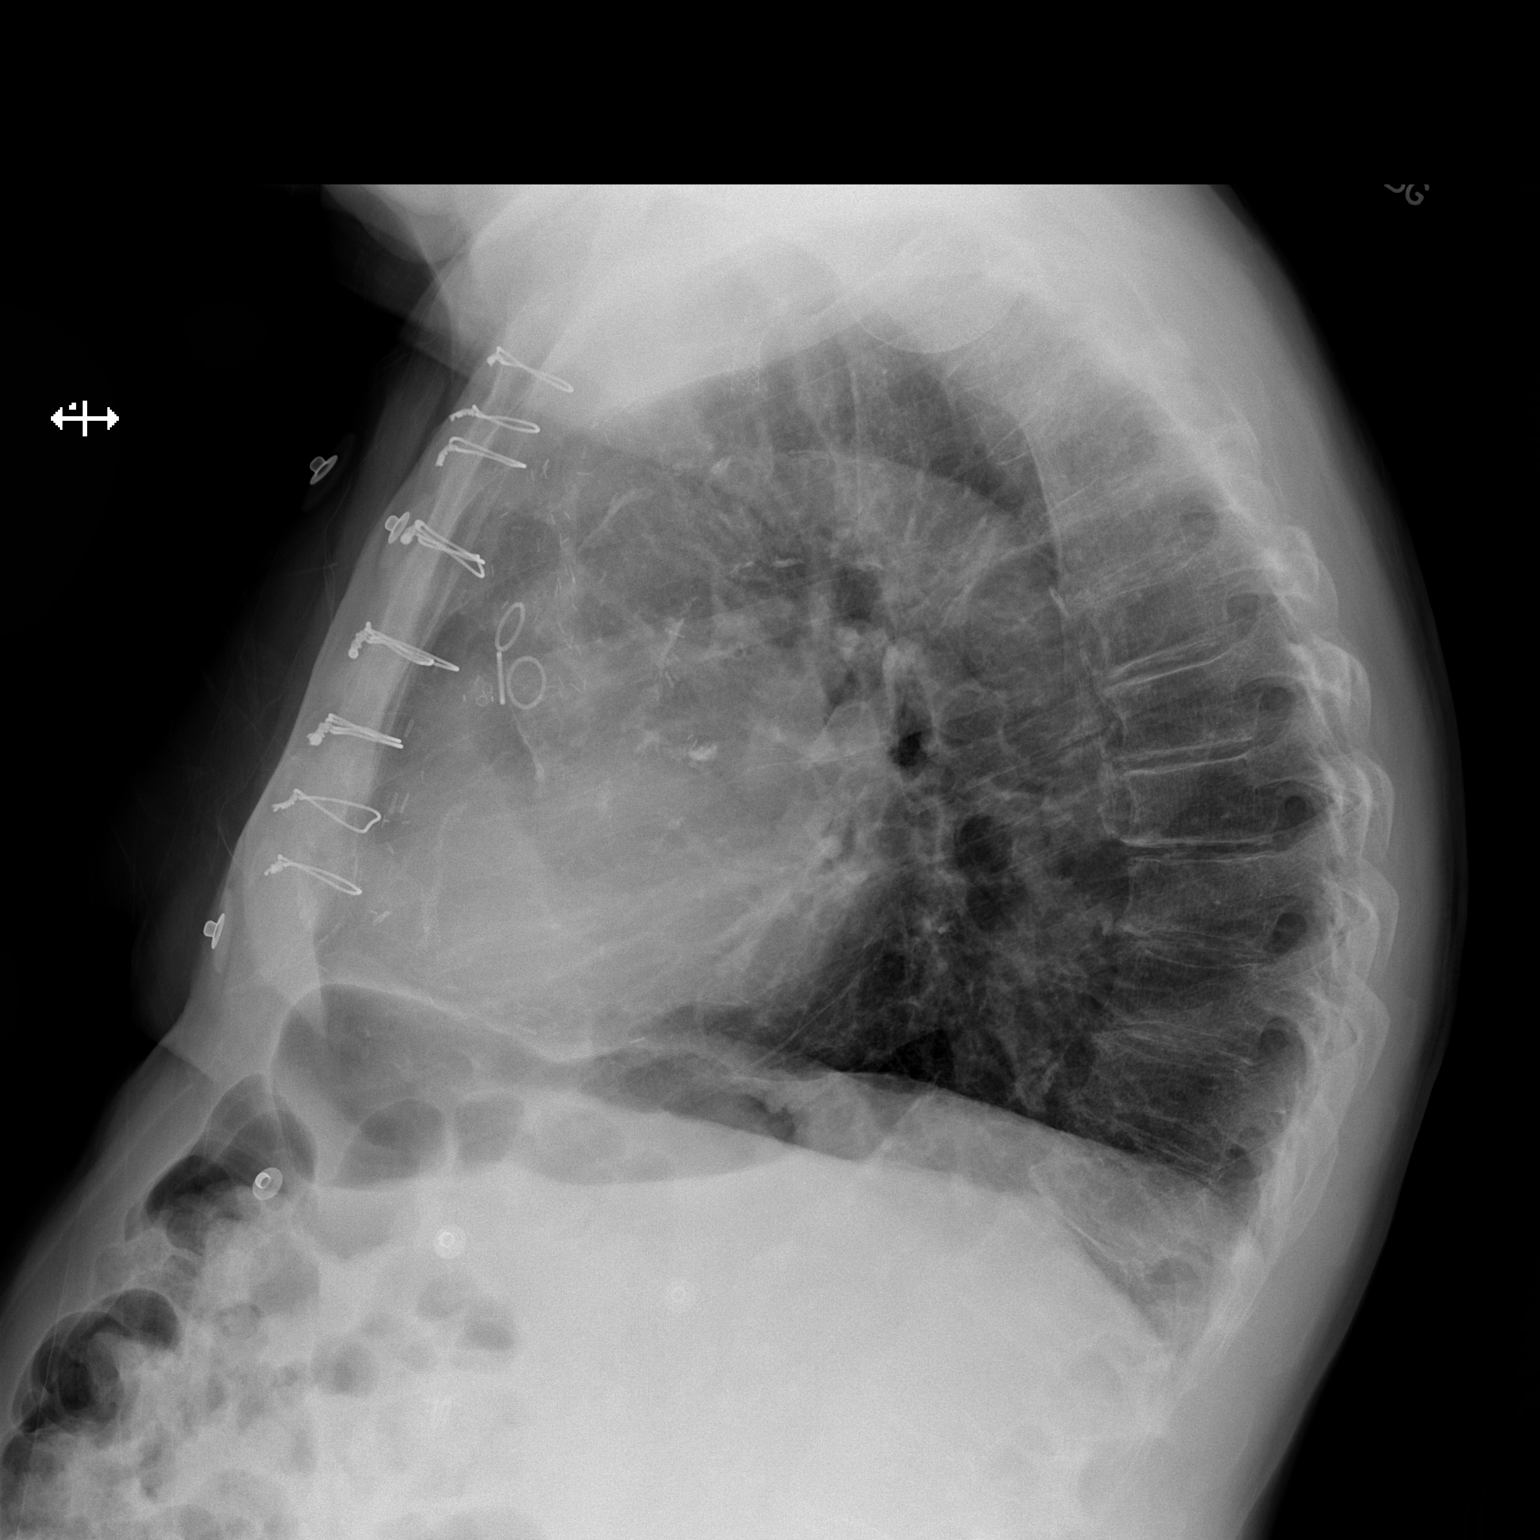

[2 of 2 positions shown; findings below may reference images not displayed]

FINDINGS: Cardiomediastinal silhouette is stable. Status post CABG. No acute
infiltrate or pleural effusion. No pulmonary edema. Osteopenia and
mild degenerative changes thoracic spine.
IMPRESSION: No active cardiopulmonary disease.

## 2017-11-13 IMAGING — CT CT ABD-PELV W/ CM
2 of 5 series · 8 of 46 positions shown, 9 images · IV contrast (Iodine)
Comparison: Abdominal radiograph dated 02/23/2015

CLINICAL DATA: 84-year-old male with diarrhea and hyponatremia.

EXAM:
CT ABDOMEN AND PELVIS WITH CONTRAST
TECHNIQUE: Multidetector CT imaging of the abdomen and pelvis was performed
using the standard protocol following bolus administration of
intravenous contrast.
CONTRAST:  100mL [id number] IOPAMIDOL (FZ0NFL-KLL) INJECTION 61%

[Series 201: routine, idose (2) · axial · 0.94mm/px · z∈[-9,+346]mm · 5 of 93 slices shown, 6 images]
[im 11/93  soft-tissue]
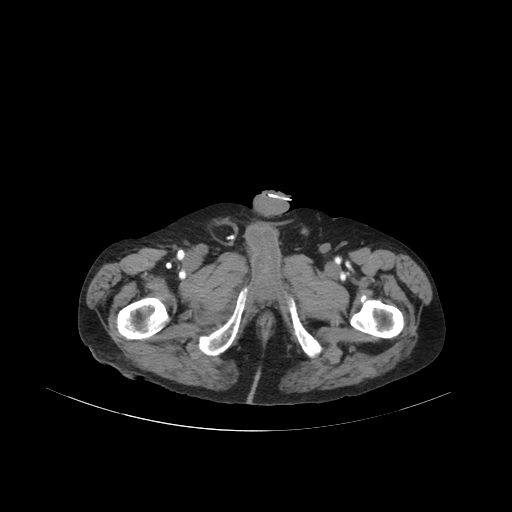
[im 11/93  bone]
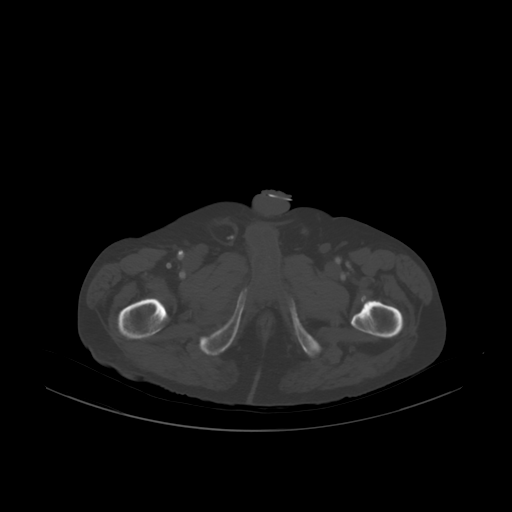
[im 28/93  soft-tissue]
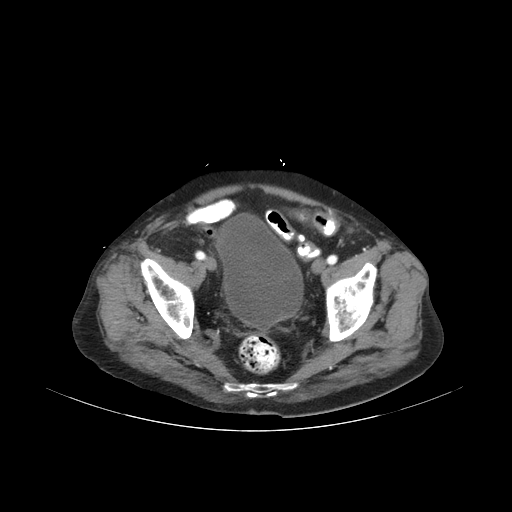
[im 49/93  soft-tissue]
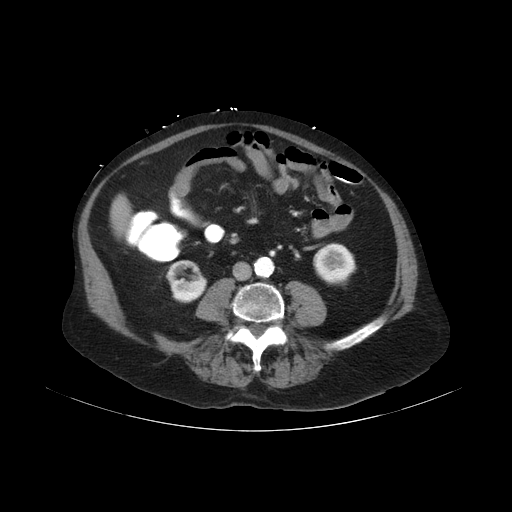
[im 65/93  soft-tissue]
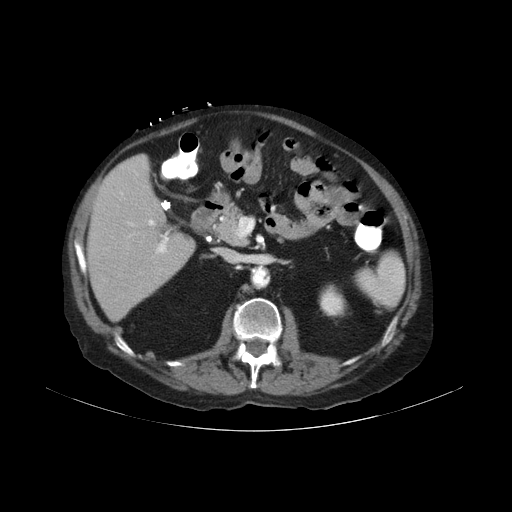
[im 82/93  soft-tissue]
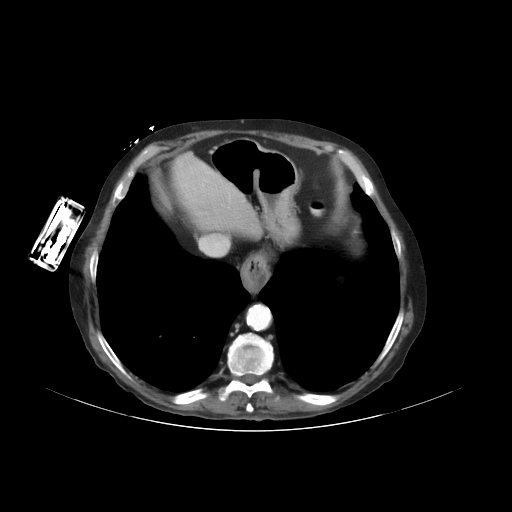

[Series 203: coronals, idose (2) · coronal · 0.45mm/px · 3 of 125 slices shown]
[im 42/125  soft-tissue]
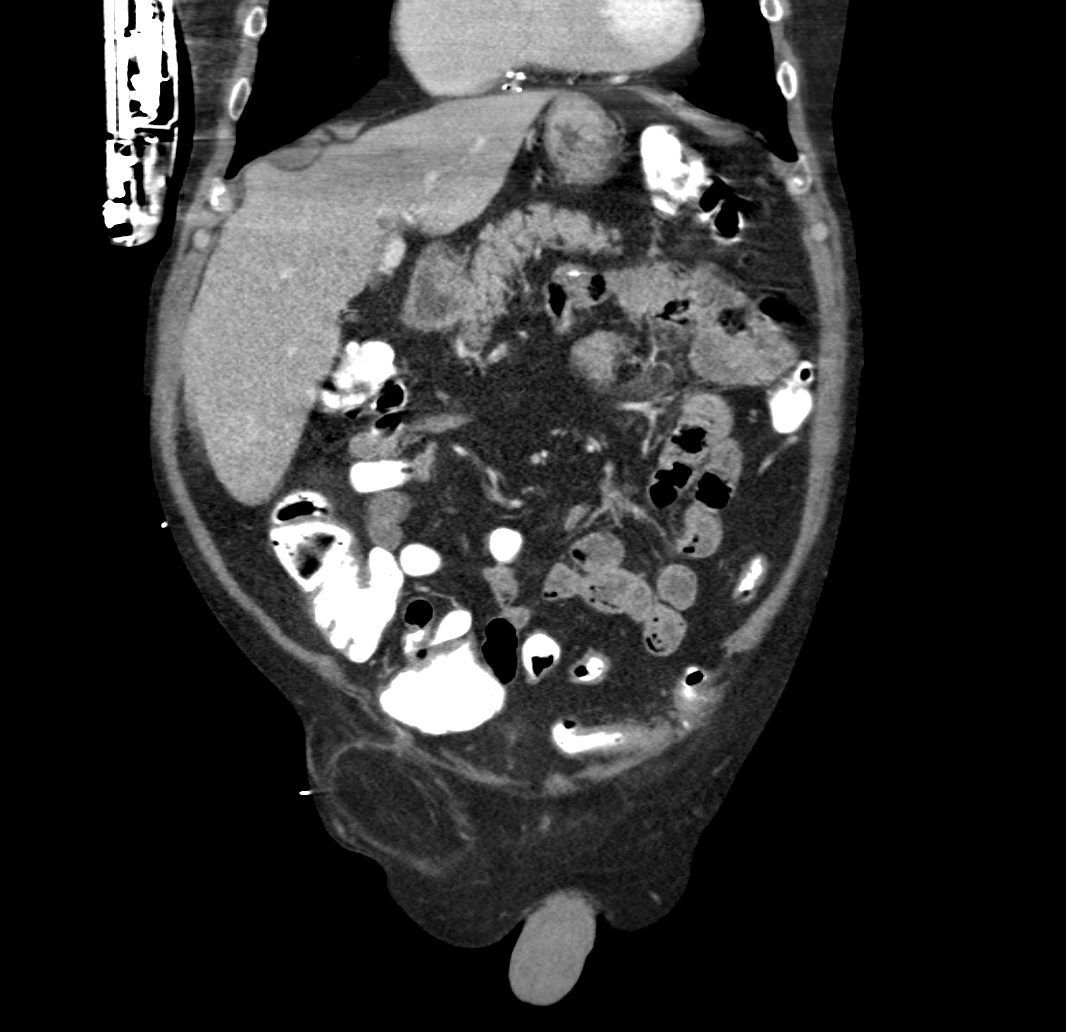
[im 56/125  soft-tissue]
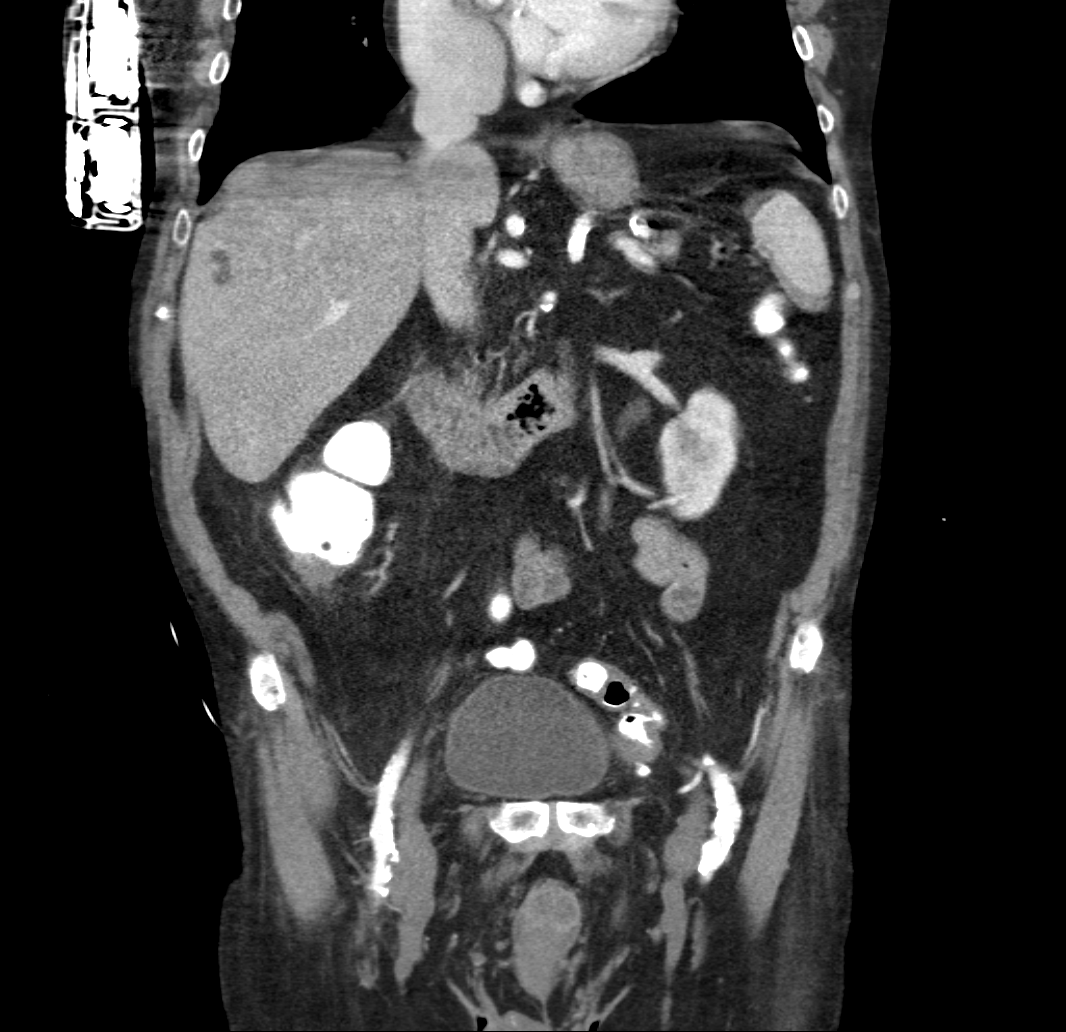
[im 69/125  soft-tissue]
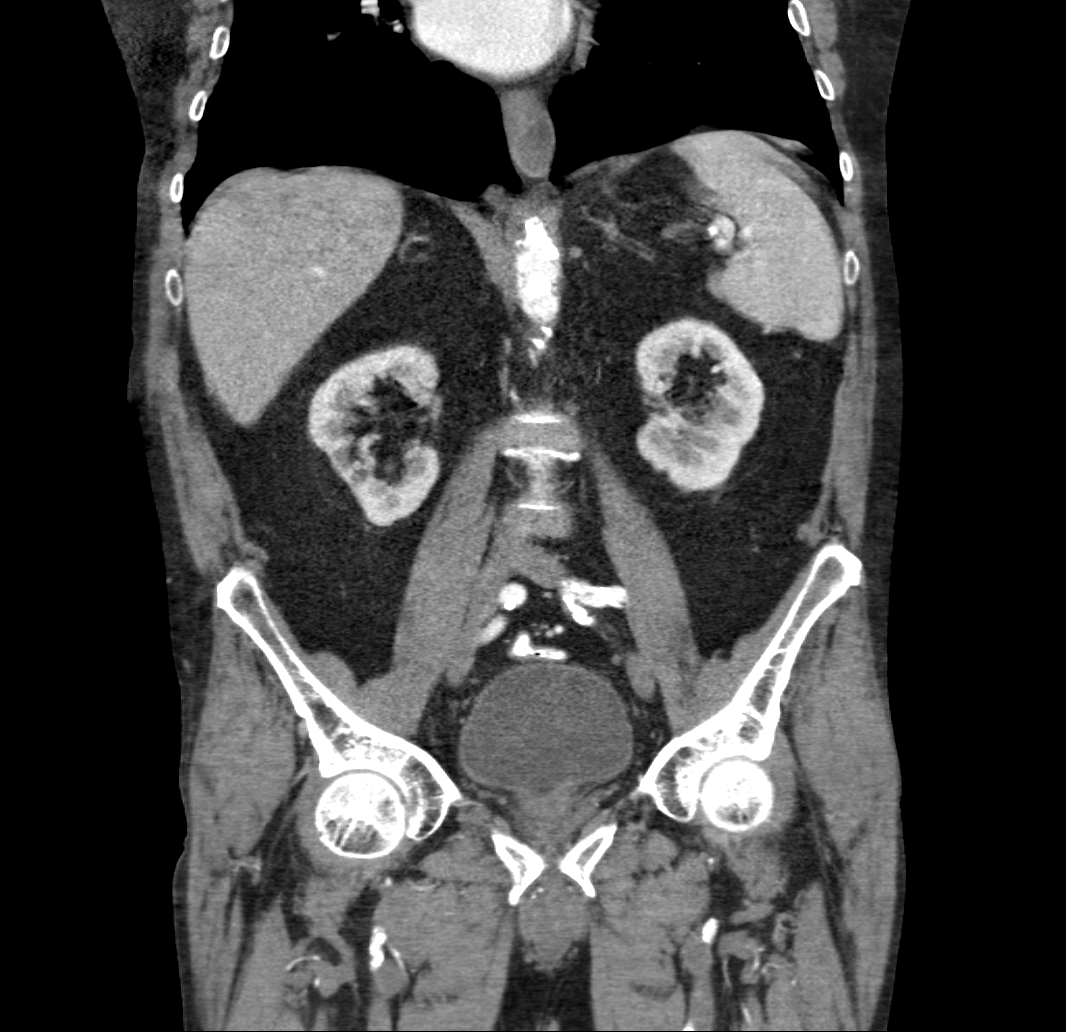

[8 of 46 positions shown; findings below may reference images not displayed]

FINDINGS: The visualized lung bases are clear. There is coronary vascular
calcification. No intra-abdominal free air or free fluid.

Multiple hepatic hypodense lesions with fluid attenuation measuring
up to 2 cm in the left lobe of the liver appear to have been present
on the chest CT dated 12/09/2013. These lesions are incompletely
characterized on the CT but likely represent cysts. MRI may provide
better characterization. Cholecystectomy. There aretwo small
calcification along the extrahepatic biliary tree measuring up to 6
mm which may represent chronic calcification versus less likely
retained stone within the CBD. There is no biliary ductal
dilatation. The pancreas, spleen, adrenal glands, appear
unremarkable. There is mild bilateral renal atrophy. There is a
cm right renal cyst. Right renal inferior pole scarring noted. There
is no hydronephrosis on either side. The visualized ureters and
urinary bladder appear unremarkable. The prostate and seminal
vesicles are grossly unremarkable.

There is sigmoid diverticulosis with muscular hypertrophy. Mild
thickened appearance of the sigmoid colon without surrounding
inflammatory changes, may be chronic. Clinical correlation is
recommended to exclude mild colitis. There is no evidence of bowel
obstruction or definite active inflammation. There is a small hiatal
hernia. The appendix herniates into the right inguinal canal and
appears unremarkable.

There is advanced aortoiliac atherosclerotic disease. There is
atherosclerotic calcification of the origins of the celiac axis,
SMA, IMA with severe narrowing of the origins of the SMA and celiac
axis. These vessels however remain patent. The splenic vein, SMV,
and main portal vein are patent. No portal venous gas identified.
There is no adenopathy.

There are bilateral fat containing inguinal hernias, right greater
than left. The right inguinal hernia also contains the appendix.
There is a small fat containing umbilical hernia. The abdominal wall
soft tissues are otherwise unremarkable. No fluid collection.

There is osteopenia with degenerative changes of the spine.
Multilevel disc desiccation with vacuum phenomena. No acute
fracture. Focal sclerotic lesion in the right iliac bone most likely
benign and a bone island.
IMPRESSION: No definite acute intra-abdominal or pelvic pathology. Sigmoid
diverticulosis. Thickened appearance of the distal descending colon
and sigmoid, may be chronic. Clinical correlation is recommended to
evaluate for mild colitis. No bowel obstruction. The appendix is
located in the right inguinal hernia and appears unremarkable.

Nonacute findings as described above.
